# Patient Record
Sex: Male | Born: 1960
Health system: Southern US, Community
[De-identification: ages and names within clinical notes are randomized; demographics above are authoritative.]

## PROBLEM LIST (undated history)

## (undated) DIAGNOSIS — N189 Chronic kidney disease, unspecified: Secondary | ICD-10-CM

## (undated) DIAGNOSIS — E785 Hyperlipidemia, unspecified: Secondary | ICD-10-CM

## (undated) DIAGNOSIS — E119 Type 2 diabetes mellitus without complications: Secondary | ICD-10-CM

## (undated) DIAGNOSIS — I639 Cerebral infarction, unspecified: Secondary | ICD-10-CM

## (undated) DIAGNOSIS — I10 Essential (primary) hypertension: Secondary | ICD-10-CM

## (undated) DIAGNOSIS — I509 Heart failure, unspecified: Secondary | ICD-10-CM

## (undated) DIAGNOSIS — K219 Gastro-esophageal reflux disease without esophagitis: Secondary | ICD-10-CM

## (undated) DIAGNOSIS — F1010 Alcohol abuse, uncomplicated: Secondary | ICD-10-CM

## (undated) HISTORY — DX: Heart failure, unspecified: I50.9

---

## 2004-06-20 ENCOUNTER — Ambulatory Visit: Payer: Self-pay | Admitting: Gastroenterology

## 2004-08-08 ENCOUNTER — Emergency Department: Payer: Self-pay | Admitting: Emergency Medicine

## 2004-08-28 ENCOUNTER — Encounter (INDEPENDENT_AMBULATORY_CARE_PROVIDER_SITE_OTHER): Payer: Self-pay | Admitting: *Deleted

## 2004-08-28 ENCOUNTER — Ambulatory Visit (HOSPITAL_COMMUNITY): Admission: RE | Admit: 2004-08-28 | Discharge: 2004-08-28 | Payer: Self-pay | Admitting: Gastroenterology

## 2005-10-19 ENCOUNTER — Ambulatory Visit: Payer: Self-pay | Admitting: Gastroenterology

## 2007-01-06 ENCOUNTER — Ambulatory Visit: Payer: Self-pay | Admitting: Gastroenterology

## 2007-06-17 ENCOUNTER — Ambulatory Visit: Payer: Self-pay | Admitting: Internal Medicine

## 2007-08-21 ENCOUNTER — Ambulatory Visit: Payer: Self-pay | Admitting: Unknown Physician Specialty

## 2008-12-13 ENCOUNTER — Inpatient Hospital Stay: Payer: Self-pay | Admitting: Internal Medicine

## 2009-04-14 ENCOUNTER — Ambulatory Visit: Payer: Self-pay | Admitting: Gastroenterology

## 2010-05-25 ENCOUNTER — Inpatient Hospital Stay: Payer: Self-pay | Admitting: Internal Medicine

## 2010-08-07 ENCOUNTER — Inpatient Hospital Stay: Payer: Self-pay | Admitting: Vascular Surgery

## 2010-08-25 ENCOUNTER — Other Ambulatory Visit: Payer: Self-pay | Admitting: Internal Medicine

## 2010-08-29 ENCOUNTER — Ambulatory Visit: Payer: Self-pay | Admitting: Vascular Surgery

## 2010-09-19 ENCOUNTER — Encounter: Payer: Self-pay | Admitting: Internal Medicine

## 2010-10-13 ENCOUNTER — Encounter: Payer: Self-pay | Admitting: Internal Medicine

## 2010-11-12 ENCOUNTER — Encounter: Payer: Self-pay | Admitting: Internal Medicine

## 2010-12-13 ENCOUNTER — Encounter: Payer: Self-pay | Admitting: Internal Medicine

## 2011-09-26 ENCOUNTER — Emergency Department: Payer: Self-pay | Admitting: Emergency Medicine

## 2011-09-26 LAB — COMPREHENSIVE METABOLIC PANEL
Albumin: 3.4 g/dL (ref 3.4–5.0)
Alkaline Phosphatase: 47 U/L — ABNORMAL LOW (ref 50–136)
Anion Gap: 4 — ABNORMAL LOW (ref 7–16)
BUN: 36 mg/dL — ABNORMAL HIGH (ref 7–18)
Bilirubin,Total: 0.3 mg/dL (ref 0.2–1.0)
Calcium, Total: 8.5 mg/dL (ref 8.5–10.1)
Chloride: 111 mmol/L — ABNORMAL HIGH (ref 98–107)
Co2: 23 mmol/L (ref 21–32)
Creatinine: 1.44 mg/dL — ABNORMAL HIGH (ref 0.60–1.30)
EGFR (African American): >60
EGFR (Non-African Amer.): 56 — ABNORMAL LOW
Glucose: 67 mg/dL (ref 65–99)
Osmolality: 282 (ref 275–301)
Potassium: 5.3 mmol/L — ABNORMAL HIGH (ref 3.5–5.1)
SGOT(AST): 29 U/L (ref 15–37)
SGPT (ALT): 33 U/L
Sodium: 138 mmol/L (ref 136–145)
Total Protein: 7 g/dL (ref 6.4–8.2)

## 2011-09-26 LAB — CBC WITH DIFFERENTIAL/PLATELET
Basophil #: 0 10*3/uL (ref 0.0–0.1)
Basophil %: 0.6 %
Eosinophil #: 0.3 10*3/uL (ref 0.0–0.7)
Eosinophil %: 4.8 %
HCT: 48.9 % (ref 40.0–52.0)
HGB: 16.4 g/dL (ref 13.0–18.0)
Lymphocyte #: 3.1 10*3/uL (ref 1.0–3.6)
Lymphocyte %: 48.3 %
MCH: 33.8 pg (ref 26.0–34.0)
MCHC: 33.5 g/dL (ref 32.0–36.0)
MCV: 101 fL — ABNORMAL HIGH (ref 80–100)
Monocyte #: 0.6 x10 3/mm (ref 0.2–1.0)
Monocyte %: 9 %
Neutrophil #: 2.4 10*3/uL (ref 1.4–6.5)
Neutrophil %: 37.3 %
Platelet: 273 10*3/uL (ref 150–440)
RBC: 4.86 10*6/uL (ref 4.40–5.90)
RDW: 12.8 % (ref 11.5–14.5)
WBC: 6.4 10*3/uL (ref 3.8–10.6)

## 2011-09-26 LAB — TROPONIN I: Troponin-I: <0.02 ng/mL

## 2011-09-26 LAB — TSH: Thyroid Stimulating Horm: 0.942 u[IU]/mL

## 2011-11-05 ENCOUNTER — Ambulatory Visit: Payer: Self-pay | Admitting: Internal Medicine

## 2014-01-17 ENCOUNTER — Inpatient Hospital Stay: Payer: Self-pay | Admitting: Internal Medicine

## 2014-01-17 LAB — CBC
HCT: 24.8 % — ABNORMAL LOW (ref 40.0–52.0)
HGB: 8.2 g/dL — ABNORMAL LOW (ref 13.0–18.0)
MCH: 34.2 pg — ABNORMAL HIGH (ref 26.0–34.0)
MCHC: 33.3 g/dL (ref 32.0–36.0)
MCV: 103 fL — ABNORMAL HIGH (ref 80–100)
Platelet: 204 10*3/uL (ref 150–440)
RBC: 2.41 10*6/uL — ABNORMAL LOW (ref 4.40–5.90)
RDW: 12.3 % (ref 11.5–14.5)
WBC: 5.6 10*3/uL (ref 3.8–10.6)

## 2014-01-17 LAB — URINALYSIS, COMPLETE
Bilirubin,UR: NEGATIVE
Blood: NEGATIVE
Glucose,UR: NEGATIVE mg/dL (ref 0–75)
Hyaline Cast: 9
Ketone: NEGATIVE
Nitrite: NEGATIVE
Ph: 5 (ref 4.5–8.0)
Protein: NEGATIVE
RBC,UR: 1 /HPF (ref 0–5)
Specific Gravity: 1.011 (ref 1.003–1.030)
Squamous Epithelial: <1
WBC UR: 6 /HPF (ref 0–5)

## 2014-01-17 LAB — COMPREHENSIVE METABOLIC PANEL
Albumin: 3 g/dL — ABNORMAL LOW (ref 3.4–5.0)
Alkaline Phosphatase: 51 U/L
Anion Gap: 8 (ref 7–16)
BUN: 79 mg/dL — ABNORMAL HIGH (ref 7–18)
Bilirubin,Total: 0.2 mg/dL (ref 0.2–1.0)
Calcium, Total: 7.9 mg/dL — ABNORMAL LOW (ref 8.5–10.1)
Chloride: 104 mmol/L (ref 98–107)
Co2: 17 mmol/L — ABNORMAL LOW (ref 21–32)
Creatinine: 2.59 mg/dL — ABNORMAL HIGH (ref 0.60–1.30)
EGFR (African American): 31 — ABNORMAL LOW
EGFR (Non-African Amer.): 27 — ABNORMAL LOW
Glucose: 122 mg/dL — ABNORMAL HIGH (ref 65–99)
Osmolality: 284 (ref 275–301)
Potassium: 5.6 mmol/L — ABNORMAL HIGH (ref 3.5–5.1)
SGOT(AST): 68 U/L — ABNORMAL HIGH (ref 15–37)
SGPT (ALT): 60 U/L
Sodium: 129 mmol/L — ABNORMAL LOW (ref 136–145)
Total Protein: 6.4 g/dL (ref 6.4–8.2)

## 2014-01-17 LAB — IRON AND TIBC
Iron Bind.Cap.(Total): 320 ug/dL (ref 250–450)
Iron Saturation: 29 %
Iron: 94 ug/dL (ref 65–175)
Unbound Iron-Bind.Cap.: 226 ug/dL

## 2014-01-17 LAB — APTT: Activated PTT: 42.9 secs — ABNORMAL HIGH (ref 23.6–35.9)

## 2014-01-17 LAB — LIPASE, BLOOD: Lipase: 217 U/L (ref 73–393)

## 2014-01-17 LAB — LIPID PANEL
Cholesterol: 184 mg/dL (ref 0–200)
HDL Cholesterol: 59 mg/dL (ref 40–60)
Ldl Cholesterol, Calc: 104 mg/dL — ABNORMAL HIGH (ref 0–100)
Triglycerides: 105 mg/dL (ref 0–200)
VLDL Cholesterol, Calc: 21 mg/dL (ref 5–40)

## 2014-01-17 LAB — PROTIME-INR
INR: 3.1
Prothrombin Time: 31.4 secs — ABNORMAL HIGH (ref 11.5–14.7)

## 2014-01-17 LAB — TROPONIN I: Troponin-I: <0.02 ng/mL

## 2014-01-17 LAB — MAGNESIUM: Magnesium: 2.5 mg/dL — ABNORMAL HIGH

## 2014-01-17 LAB — RETICULOCYTES
Absolute Retic Count: 0.1291 10*6/uL (ref 0.019–0.186)
Reticulocyte: 5.39 % — ABNORMAL HIGH (ref 0.4–3.1)

## 2014-01-17 LAB — FERRITIN: Ferritin (ARMC): 80 ng/mL (ref 8–388)

## 2014-01-17 LAB — HEMOGLOBIN A1C: Hemoglobin A1C: <3.5 % — ABNORMAL LOW (ref 4.2–6.3)

## 2014-01-18 LAB — CBC WITH DIFFERENTIAL/PLATELET
Basophil #: 0 10*3/uL (ref 0.0–0.1)
Basophil %: 0.5 %
Eosinophil #: 0.1 10*3/uL (ref 0.0–0.7)
Eosinophil %: 1.8 %
HCT: 24.8 % — ABNORMAL LOW (ref 40.0–52.0)
HGB: 8.3 g/dL — ABNORMAL LOW (ref 13.0–18.0)
Lymphocyte #: 1.4 10*3/uL (ref 1.0–3.6)
Lymphocyte %: 32.2 %
MCH: 33.1 pg (ref 26.0–34.0)
MCHC: 33.5 g/dL (ref 32.0–36.0)
MCV: 99 fL (ref 80–100)
Monocyte #: 0.5 x10 3/mm (ref 0.2–1.0)
Monocyte %: 11.7 %
Neutrophil #: 2.4 10*3/uL (ref 1.4–6.5)
Neutrophil %: 53.8 %
Platelet: 176 10*3/uL (ref 150–440)
RBC: 2.51 10*6/uL — ABNORMAL LOW (ref 4.40–5.90)
RDW: 14.3 % (ref 11.5–14.5)
WBC: 4.5 10*3/uL (ref 3.8–10.6)

## 2014-01-18 LAB — BASIC METABOLIC PANEL
Anion Gap: 3 — ABNORMAL LOW (ref 7–16)
BUN: 68 mg/dL — ABNORMAL HIGH (ref 7–18)
Calcium, Total: 8.4 mg/dL — ABNORMAL LOW (ref 8.5–10.1)
Chloride: 111 mmol/L — ABNORMAL HIGH (ref 98–107)
Co2: 20 mmol/L — ABNORMAL LOW (ref 21–32)
Creatinine: 1.94 mg/dL — ABNORMAL HIGH (ref 0.60–1.30)
EGFR (African American): 44 — ABNORMAL LOW
EGFR (Non-African Amer.): 38 — ABNORMAL LOW
Glucose: 114 mg/dL — ABNORMAL HIGH (ref 65–99)
Osmolality: 289 (ref 275–301)
Potassium: 5.3 mmol/L — ABNORMAL HIGH (ref 3.5–5.1)
Sodium: 134 mmol/L — ABNORMAL LOW (ref 136–145)

## 2014-01-18 LAB — HEMOGLOBIN
HGB: 8 g/dL — ABNORMAL LOW (ref 13.0–18.0)
HGB: 8.2 g/dL — ABNORMAL LOW (ref 13.0–18.0)

## 2014-01-18 LAB — PROTIME-INR
INR: 3.3
Prothrombin Time: 32.9 secs — ABNORMAL HIGH (ref 11.5–14.7)

## 2014-01-19 LAB — COMPREHENSIVE METABOLIC PANEL
Albumin: 2.4 g/dL — ABNORMAL LOW (ref 3.4–5.0)
Alkaline Phosphatase: 39 U/L — ABNORMAL LOW
Anion Gap: 3 — ABNORMAL LOW (ref 7–16)
BUN: 43 mg/dL — ABNORMAL HIGH (ref 7–18)
Bilirubin,Total: 0.2 mg/dL (ref 0.2–1.0)
Calcium, Total: 7.9 mg/dL — ABNORMAL LOW (ref 8.5–10.1)
Chloride: 116 mmol/L — ABNORMAL HIGH (ref 98–107)
Co2: 21 mmol/L (ref 21–32)
Creatinine: 1.57 mg/dL — ABNORMAL HIGH (ref 0.60–1.30)
EGFR (African American): 57 — ABNORMAL LOW
EGFR (Non-African Amer.): 49 — ABNORMAL LOW
Glucose: 113 mg/dL — ABNORMAL HIGH (ref 65–99)
Osmolality: 291 (ref 275–301)
Potassium: 5.2 mmol/L — ABNORMAL HIGH (ref 3.5–5.1)
SGOT(AST): 43 U/L — ABNORMAL HIGH (ref 15–37)
SGPT (ALT): 43 U/L
Sodium: 140 mmol/L (ref 136–145)
Total Protein: 5.5 g/dL — ABNORMAL LOW (ref 6.4–8.2)

## 2014-01-19 LAB — PROTIME-INR
INR: 1.9
Prothrombin Time: 21.6 secs — ABNORMAL HIGH (ref 11.5–14.7)

## 2014-01-19 LAB — OCCULT BLOOD X 1 CARD TO LAB, STOOL: Occult Blood, Feces: POSITIVE

## 2014-01-20 LAB — CBC WITH DIFFERENTIAL/PLATELET
Basophil #: 0 10*3/uL (ref 0.0–0.1)
Basophil %: 0.3 %
Eosinophil #: 0.2 10*3/uL (ref 0.0–0.7)
Eosinophil %: 3 %
HCT: 19.7 % — ABNORMAL LOW (ref 40.0–52.0)
HGB: 6.4 g/dL — ABNORMAL LOW (ref 13.0–18.0)
Lymphocyte #: 1.7 10*3/uL (ref 1.0–3.6)
Lymphocyte %: 29.6 %
MCH: 33.4 pg (ref 26.0–34.0)
MCHC: 32.7 g/dL (ref 32.0–36.0)
MCV: 102 fL — ABNORMAL HIGH (ref 80–100)
Monocyte #: 0.5 x10 3/mm (ref 0.2–1.0)
Monocyte %: 8.7 %
Neutrophil #: 3.3 10*3/uL (ref 1.4–6.5)
Neutrophil %: 58.4 %
Platelet: 222 10*3/uL (ref 150–440)
RBC: 1.93 10*6/uL — ABNORMAL LOW (ref 4.40–5.90)
RDW: 14.4 % (ref 11.5–14.5)
WBC: 5.7 10*3/uL (ref 3.8–10.6)

## 2014-01-20 LAB — BASIC METABOLIC PANEL
Anion Gap: 5 — ABNORMAL LOW (ref 7–16)
BUN: 18 mg/dL (ref 7–18)
Calcium, Total: 7.3 mg/dL — ABNORMAL LOW (ref 8.5–10.1)
Chloride: 116 mmol/L — ABNORMAL HIGH (ref 98–107)
Co2: 22 mmol/L (ref 21–32)
Creatinine: 1.32 mg/dL — ABNORMAL HIGH (ref 0.60–1.30)
EGFR (African American): >60
EGFR (Non-African Amer.): >60
Glucose: 94 mg/dL (ref 65–99)
Osmolality: 287 (ref 275–301)
Potassium: 4.6 mmol/L (ref 3.5–5.1)
Sodium: 143 mmol/L (ref 136–145)

## 2014-01-20 LAB — PROTIME-INR
INR: 1.5
Prothrombin Time: 17.7 secs — ABNORMAL HIGH (ref 11.5–14.7)

## 2014-01-25 LAB — PATHOLOGY REPORT

## 2014-01-28 ENCOUNTER — Ambulatory Visit: Payer: Self-pay | Admitting: Internal Medicine

## 2014-01-29 LAB — IRON AND TIBC
Iron Bind.Cap.(Total): 350 ug/dL (ref 250–450)
Iron Saturation: 9 %
Iron: 33 ug/dL — ABNORMAL LOW (ref 65–175)
Unbound Iron-Bind.Cap.: 317 ug/dL

## 2014-01-29 LAB — CBC CANCER CENTER
Basophil #: 0 x10 3/mm (ref 0.0–0.1)
Basophil %: 0.7 %
Eosinophil #: 0.1 x10 3/mm (ref 0.0–0.7)
Eosinophil %: 1.9 %
HCT: 33.4 % — ABNORMAL LOW (ref 40.0–52.0)
HGB: 10.9 g/dL — ABNORMAL LOW (ref 13.0–18.0)
Lymphocyte #: 1.4 x10 3/mm (ref 1.0–3.6)
Lymphocyte %: 25.8 %
MCH: 31.3 pg (ref 26.0–34.0)
MCHC: 32.7 g/dL (ref 32.0–36.0)
MCV: 96 fL (ref 80–100)
Monocyte #: 0.5 x10 3/mm (ref 0.2–1.0)
Monocyte %: 10.3 %
Neutrophil #: 3.2 x10 3/mm (ref 1.4–6.5)
Neutrophil %: 61.3 %
Platelet: 375 x10 3/mm (ref 150–440)
RBC: 3.49 10*6/uL — ABNORMAL LOW (ref 4.40–5.90)
RDW: 15.1 % — ABNORMAL HIGH (ref 11.5–14.5)
WBC: 5.2 x10 3/mm (ref 3.8–10.6)

## 2014-01-29 LAB — FOLATE: Folic Acid: 10.4 ng/mL (ref 3.1–100.0)

## 2014-01-29 LAB — FERRITIN: Ferritin (ARMC): 54 ng/mL (ref 8–388)

## 2014-02-03 LAB — PROT IMMUNOELECTROPHORES(ARMC)

## 2014-02-03 LAB — KAPPA/LAMBDA FREE LIGHT CHAINS (ARMC)

## 2014-02-11 ENCOUNTER — Ambulatory Visit: Payer: Self-pay | Admitting: Internal Medicine

## 2014-02-12 LAB — CBC CANCER CENTER
Basophil #: 0.1 x10 3/mm (ref 0.0–0.1)
Basophil %: 0.9 %
Eosinophil #: 0.1 x10 3/mm (ref 0.0–0.7)
Eosinophil %: 1.8 %
HCT: 41.7 % (ref 40.0–52.0)
HGB: 13.1 g/dL (ref 13.0–18.0)
Lymphocyte #: 1.6 x10 3/mm (ref 1.0–3.6)
Lymphocyte %: 19.8 %
MCH: 29.9 pg (ref 26.0–34.0)
MCHC: 31.4 g/dL — ABNORMAL LOW (ref 32.0–36.0)
MCV: 95 fL (ref 80–100)
Monocyte #: 0.7 x10 3/mm (ref 0.2–1.0)
Monocyte %: 8.7 %
Neutrophil #: 5.4 x10 3/mm (ref 1.4–6.5)
Neutrophil %: 68.8 %
Platelet: 322 x10 3/mm (ref 150–440)
RBC: 4.38 10*6/uL — ABNORMAL LOW (ref 4.40–5.90)
RDW: 16.8 % — ABNORMAL HIGH (ref 11.5–14.5)
WBC: 7.9 x10 3/mm (ref 3.8–10.6)

## 2014-03-14 ENCOUNTER — Ambulatory Visit: Payer: Self-pay | Admitting: Internal Medicine

## 2014-09-04 NOTE — Consult Note (Signed)
PATIENT NAME:  [name], [name] MR#:  [id number] DATE OF BIRTH:  [birth year]  DATE OF CONSULTATION:  01/18/2014  REFERRING PHYSICIAN:  Jenny Reichmann B. Sarina Ser, MD CONSULTING PHYSICIAN:  Arther Dames, MD  REASON FOR CONSULTATION: Severe anemia.   HISTORY OF PRESENT ILLNESS: [name] is a 54 year old male with a past medical history notable for CVA, carotid artery stenosis, hypertension, DM 2 who presented to the hospital with severe weakness. He reports that he could barely stand up the day prior to presentation  and was having jerky-like motions and therefore presented to the Emergency Room. In the Emergency Room, it was noted that he had anemia of 8 compared to a prior of 14 back in December 2014. He was also somewhat hypotensive and had worsening kidney injury in the Emergency Room.   In terms of his stools, he is not really sure what color his bowel movements have been. He does report that he normally moves his bowels about 3 times a day and that trend has continued up until this admission. He thinks that when he wiped here in the hospital he may have seen some dark material, but he is really not at all sure of any of the color of his stools or any of the color on the toilet paper.   He denies any prior GI bleed. He denies taking any ibuprofen, Aleve, naproxen. He does report that he thinks he had a colonoscopy about 3 years ago that was unremarkable.   PAST MEDICAL HISTORY:  1.  Hypertension.  2.  DM 2.  3.  CVA.  4.  Carotid artery stenosis.   PAST SURGICAL HISTORY: Amputation of the left first and second toes.   ALLERGIES: NKDA.  HOME MEDICATIONS: Aspirin 325 daily, lisinopril 20 mg b.i.d., Coumadin 6 mg daily Monday through Friday and 8 mg on Saturday and Sunday.   SOCIAL HISTORY: He denies any tobacco or alcohol to me.   FAMILY HISTORY: No family history of GI malignancy that he is aware of.  REVIEW OF SYSTEMS: A 10 system review was conducted. It is negative except as stated in the  HPI.    VITAL SIGNS: Temperature is 98.3, pulse is 80, respirations are 18, blood pressure is 103/68, orthostatics are positive. Pulse oximetry is 99% on room air.   LABORATORY DATA: Currently his sodium 134, potassium 5.3, creatinine 1.94, BUN is 68. His TIBC 320, iron sat is 29%. His ferritin is 80. Liver enzymes: AST is elevated at 68, ALT is 60, otherwise they are normal. Albumin is slightly low at 3. Hemoglobin currently stable at 8.2. White count is 4.5, platelets are 176,000. His INR currently is 3.3. His reticulocyte counts are increased.   ASSESSMENT AND PLAN: Severe anemia: He does seem to have a significant drop from his baseline of 14 about 8 months ago to 8 currently. Unfortunately, he is unsure of the color of his stool. It is also not clear the color of his bowel movements here in the hospital. He is currently declining a rectal examination at this time. Although his iron studies are not consistent with an iron-deficiency anemia, it is possible that he has been having some overt bleeding and just has not noticed it in his stool.   Given the need to go back on anticoagulation, I think it would be reasonable to perform an upper endoscopy and a colonoscopy to look for a potential source of bleeding. In the meantime, I would like to see the color of his next  bowel movement and to perform a Hemoccults on his stool. We will need to monitor his hemoglobin and keep his hemoglobin greater than 8 as it currently is. Colonoscopy and upper endoscopy will likely be on Wednesday, given he is eating solid foods today.   Thank you for this consult    ____________________________ Arther Dames, MD mr:TT D: 01/18/2014 19:52:37 ET T: 01/18/2014 21:28:02 ET JOB#: [id number]  cc: Arther Dames, MD, <Dictator> Mellody Life MD ELECTRONICALLY SIGNED 01/28/2014 20:44

## 2014-09-04 NOTE — Consult Note (Signed)
Brief Consult Note: Diagnosis: anemia, ? melena.   Patient was seen by consultant.   Consult note dictated.   Recommend to proceed with surgery or procedure.   Comments: Anemia, unclear if any overt bleeding or not, now c/w IDA but if is more acute, would not have time to reflect in iron studies.   Recs: - FOBT - likely EGD + colon once INR has trended down.  Electronic Signatures: Arther Dames (MD)  (Signed 07-Sep-15 20:08)  Authored: Brief Consult Note   Last Updated: 07-Sep-15 20:08 by Arther Dames (MD)

## 2014-09-04 NOTE — Consult Note (Signed)
Details:   - GI Note:  Plan for EGD + Colon tomorrow.  Please give half of the go-lyte tonight and half from 7 am to 9 am.   Electronic Signatures: Arther Dames (MD)  (Signed 08-Sep-15 17:32)  Authored: Details   Last Updated: 08-Sep-15 17:32 by Arther Dames (MD)

## 2014-09-04 NOTE — Discharge Summary (Signed)
PATIENT NAME:  [name], [name] MR#:  [id number] DATE OF BIRTH:  1961-02-17  DATE OF ADMISSION:  01/17/2014 DATE OF DISCHARGE:  01/20/2014  DIAGNOSES AT TIME OF DISCHARGE:  1. Generalized weakness with anemia, most likely secondary to gastrointestinal blood loss.  2. History of previous cerebrovascular accident on chronic warfarin therapy.  3. Acute renal failure with hyperkalemia, improved with IV hydration.  4. Diet-controlled diabetes.  5. Hypertension.   CHIEF COMPLAINT: Generalized weakness.   HISTORY OF PRESENT ILLNESS: Alijha Rummel is a 54 year old African-American male with history of hypertension, diet-controlled diabetes, previous strokes with critical bilateral carotid stenosis on Coumadin and aspirin, who presented to the hospital complaining of generalized weakness associated with tremulousness of both his hands. The patient was also noted to be hypotensive with systolic blood pressure in the 80s and initial hemoglobin on admission was 8.8 with hematocrit of 24.8.  The patient's normal hemoglobin about two years back was 16. The patient denies any passage of blood in the stool, but however his stool guaiac was positive.   PAST MEDICAL HISTORY: Significant for hypertension, diet-controlled diabetes, prior strokes, critical bilateral carotid stenosis.   PAST SURGICAL HISTORY: Significant for left first and second toe amputation for infection. Please see H and P for other details.   HOSPITAL COURSE: The patient was admitted to [facility] and received intravenous fluids. He was also seen in consultation by gastroenterologist Dr. Rayann Heman and underwent both colonoscopy as well as EGD.  His Coumadin was held for a couple of days prior to procedure. EGD showed normal esophagus, normal stomach and erythematous duodenopathy. Colonoscopy was also done at the same time or immediately after and showed non-thrombosed external hemorrhoids. There was one 4 mm polyp in the sigmoid colon that was resected and one 2  mm polyp in the transverse colon, was also resected, possible polyp was also noted in the sigmoid colon of 3 mm but Dr. Rayann Heman was not able to localize it again because of severe spasm. The patient was advised to have a video capsule, but again this was not done because of equipment issues. The patient was reluctant to stay in the hospital and made several requests to go home. Home health PT and nursing were arranged for him and he was advised to restart his warfarin. His renal function did improve with creatinine improving from 2.59 to 1.32 on day of admission. He  received a total of 3 units of blood transfusion. Ultrasound of his kidneys was also done that was essentially normal. The patient was stable at the time of discharge and was discharged home on the following medications.    DISCHARGE MEDICATIONS: Warfarin 6 mg Monday through Friday and 8 mg on Saturday and Sunday, pravastatin 20 mg once a day, lisinopril 10 mg once a day, and omeprazole 20 mg p.o. b.i.d.   FOLLOWUP:  The patient was advised to follow up with me, Dr. Ginette Pitman, and also follow up with Dr. Rayann Heman in 1 to 2 weeks. Advised that he would need a capsule endoscopy as an outpatient. He has been advised to have a CBC and PT/INR also checked in 1 week. He has been advised to report to the ER if he has any persistent weakness or if he notices any blood in the stool or if he feels unwell in any way. He was stable at the time of discharge.   TOTAL TIME SPENT IN DISCHARGING THE PATIENT: 35 minutes.     ____________________________ [name], MD vh:bu D: 01/21/2014 17:23:59 ET  T: 01/21/2014 17:47:40 ET JOB#: [id number]  cc: [name], MD, <Dictator> [name] MD ELECTRONICALLY SIGNED 02/02/2014 17:55

## 2014-09-04 NOTE — Consult Note (Signed)
Details:   - GI Note:  EGD and colon done today with no source of bleeding seen.  Recs: - capsule endoscopy today - hematology consult - ok to start anti-coag if capsule ok.   Electronic Signatures: Arther Dames (MD)  (Signed 09-Sep-15 13:27)  Authored: Details   Last Updated: 09-Sep-15 13:27 by Arther Dames (MD)

## 2014-09-04 NOTE — H&P (Signed)
PATIENT NAME:  [name], [name] MR#:  [id number] DATE OF BIRTH:  Sep 17, 1960  DATE OF ADMISSION:  01/17/2014  ADMITTING PHYSICIAN: Gladstone Lighter, MD   PRIMARY CARE PHYSICIAN: Dr. Ginette Pitman   CHIEF COMPLAINT: Generalized weakness.   HISTORY OF PRESENT ILLNESS: [name] is a 54 year old African American male with past medical history significant for hypertension, diet-controlled diabetes mellitus, 3 strokes in the past with critical bilateral carotid artery stenosis, on Coumadin and aspirin, comes to the hospital secondary to generalized weakness and shakes of both his hands that started yesterday. The patient is very unhappy to stay in the hospital. He has been having weakness since yesterday and has shakes of his right arm and right leg, which he usually has jerks since the stroke, but have worsened in frequency since yesterday. His wife was concerned that he was having another stroke so called EMS and he was brought over here. The patient is noted to be hypotensive, systolic blood pressure in the 80s, with orthostatic increase in his heart rate whenever he tries to stand up. His hemoglobin is noted to be at 8 today. His hemoglobin back a couple of years ago was around 16. He denies any active bleeding or melanotic stools, still his guaiac here is positive. So he is being admitted for anemia and also noted to have hyperkalemia and acute renal failure on his blood work.   PAST MEDICAL HISTORY:  1.  Hypertension.  2.  Diet-controlled diabetes mellitus.  3.  History of prior strokes with residual right-sided weakness. 4.  Critical bilateral carotid artery stenosis.  PAST SURGICAL HISTORY: Left first and second toes amputation for infection.   ALLERGIES: No known drug allergies.   CURRENT HOME MEDICATIONS:  1.  Aspirin 325 mg p.o. daily. 2.  Lisinopril 20 mg p.o. b.i.d.  3.  Coumadin 6 mg p.o. daily for Monday through Friday and 8 mg on Saturday and Sunday.   SOCIAL HISTORY: Lives at home with his  wife. He ambulates independently but sometimes has to use support. He has a cane, which he does not use. Smokes about 3 cigars every day and occasional alcohol use.   FAMILY HISTORY: Mom with dementia; dad with diabetes and end-stage renal disease on dialysis.   REVIEW OF SYSTEMS: CONSTITUTIONAL: No fever. Positive for fatigue and weakness.  EYES: No blurred vision, double vision, inflammation or glaucoma.  EARS, NOSE AND THROAT: No tinnitus, ear pain, hearing loss, epistaxis or discharge.  RESPIRATORY: No cough, wheeze, hemoptysis or COPD.  CARDIOVASCULAR: No chest pain, orthopnea, edema, arrhythmia, palpitations or syncope.  GASTROINTESTINAL: Positive for nausea. No vomiting, diarrhea, abdominal pain, hematemesis or melena.  GENITOURINARY: No dysuria, hematuria or renal calculus, frequency or incontinence.  ENDOCRINE: No polyuria, nocturia, thyroid problems, heat or cold intolerance.  HEMATOLOGY: No anemia, easy bruising or bleeding.  SKIN: No acne, rash or lesions.  MUSCULOSKELETAL: No neck fracture, pain, arthritis or gout.  NEUROLOGIC: Positive for history of CVA. No numbness, weakness, TIA or seizures.  PSYCHOLOGIC: No anxiety, insomnia, depression.   PHYSICAL EXAMINATION:  VITAL SIGNS: Temperature 98.1 degrees Fahrenheit, pulse 97, respirations 19, blood pressure 85/53, pulse oximetry 100% on room air.  GENERAL: Well-built, well-nourished male lying in bed, not in any acute distress.  HEENT: Normocephalic, atraumatic.  Pupils equal, round, reacting to light. Muddy conjunctivae. Anicteric sclerae. Extraocular movements intact. Oropharynx clear without erythema, mass or exudates.  NECK: Supple, nontender. No thyromegaly, JVD or carotid bruit. No lymphadenopathy.  LUNGS: Moving air bilaterally. No wheeze or crackles.  No use of accessory muscles for breathing.  CARDIOVASCULAR: S1, S2, regular rate and rhythm. No murmurs, rubs or gallops.  ABDOMEN: Soft, nontender, nondistended. No  hepatosplenomegaly. Normal bowel sounds.  EXTREMITIES: No pedal edema. No clubbing or cyanosis. Dorsalis pedis pulses 2+  palpable bilaterally.  SKIN: No acne, rash or lesions. LYMPHATICS: No cervical or inguinal lymphadenopathy.  NEUROLOGIC: Cranial nerves intact. Strength 4/5 on right upper and right lower extremities. No new motor or sensory deficits.  PSYCHOLOGICAL: The patient is awake, alert, oriented x 3.   LABORATORY DATA: WBC is 5.6, hemoglobin 8.8, hematocrit 24.8, platelet count 204,000.   Sodium 129, potassium 5.6, chloride 104, bicarbonate 17, BUN 79, creatinine 2.59, glucose of 122, calcium of 7.9.   ALT 60, AST 68, alkaline phosphatase 51, total bilirubin 0.2, albumin of 3.0. Magnesium 2.5. Lipase 217. INR 3.1. Troponin less than 0.02.   CT of the head without contrast showing no acute intracranial hemorrhage or acute cortical infarct, old left cerebral hemispheric infarction, old right caudate nucleus infarcts noted. EKG showing normal sinus rhythm, heart rate of 97. No acute ST-T wave abnormalities.   ASSESSMENT AND PLAN: This is a 54 year old male with history of hypertension, diabetes diet-controlled and prior history of stroke with minimal residual right-sided weakness and bilateral carotid artery stenosis comes with weakness. Noted to have anemia and acute renal failure and hyperkalemia.  1.  Anemia, likely acute on chronic anemia. Last hemoglobin 2 years ago was 16, now he is at 8.  He denies any active bleeding but he is on Coumadin and warfarin and his stool is positive for guaiac. So hold both medications for now. He has hypotension, tachycardic and symptomatic so he is going to get 1 unit of packed red blood cells. Recheck hemoglobin q. 8 hours. Check orthostatic vital signs and get a gastrointestinal consult for the same. We will place him on Protonix drip empirically at this time.   2.  Acute renal failure and hyperkalemia. Likely dehydration and on lisinopril. Hold  lisinopril. Intravenous fluids. Renal ultrasound and monitor.  3.  History of cerebrovascular accident. Both aspirin and warfarin are held tonight. Recheck hemoglobin. The patient will need to be on the anticoagulation at discharge as significant carotid stenosis with prior multiple cerebrovascular accidents. Nonoperable stenosis. Physical therapy in the a.m. Also added statin. Check lipid profile.  4.  Diabetes mellitus. The patient says he was on metformin, that was taken off by his endocrinologist.  5.  Deep veinous thrombosis prophylaxis with thromboembolic deterrent stockings and sequential compression devices.  CODE STATUS: Full code.  TIME SPENT ON ADMISSION: Fifty minutes.    ____________________________ Gladstone Lighter, MD rk:TT D: 01/17/2014 18:24:15 ET T: 01/17/2014 18:58:58 ET JOB#: [id number]  cc: Gladstone Lighter, MD, <Dictator> unknown cc Gladstone Lighter MD ELECTRONICALLY SIGNED 01/18/2014 17:37

## 2015-12-29 ENCOUNTER — Ambulatory Visit: Payer: Self-pay

## 2015-12-29 ENCOUNTER — Other Ambulatory Visit: Payer: Self-pay | Admitting: Internal Medicine

## 2015-12-29 DIAGNOSIS — I6320 Cerebral infarction due to unspecified occlusion or stenosis of unspecified precerebral arteries: Secondary | ICD-10-CM

## 2015-12-29 DIAGNOSIS — G458 Other transient cerebral ischemic attacks and related syndromes: Secondary | ICD-10-CM

## 2015-12-31 ENCOUNTER — Ambulatory Visit
Admission: RE | Admit: 2015-12-31 | Discharge: 2015-12-31 | Disposition: A | Discharge status: Home / Self Care | Payer: Medicare Other | Source: Ambulatory Visit | Attending: Internal Medicine | Admitting: Internal Medicine

## 2015-12-31 DIAGNOSIS — G458 Other transient cerebral ischemic attacks and related syndromes: Secondary | ICD-10-CM

## 2015-12-31 DIAGNOSIS — I6320 Cerebral infarction due to unspecified occlusion or stenosis of unspecified precerebral arteries: Secondary | ICD-10-CM

## 2015-12-31 DIAGNOSIS — I739 Peripheral vascular disease, unspecified: Secondary | ICD-10-CM | POA: Insufficient documentation

## 2015-12-31 DIAGNOSIS — I639 Cerebral infarction, unspecified: Secondary | ICD-10-CM | POA: Insufficient documentation

## 2016-06-14 ENCOUNTER — Other Ambulatory Visit (HOSPITAL_COMMUNITY): Payer: Self-pay | Admitting: Neurological Surgery

## 2016-06-14 DIAGNOSIS — M4120 Other idiopathic scoliosis, site unspecified: Secondary | ICD-10-CM

## 2016-06-29 ENCOUNTER — Encounter: Payer: Self-pay | Admitting: Emergency Medicine

## 2016-06-29 ENCOUNTER — Emergency Department: Payer: Medicare Other

## 2016-06-29 ENCOUNTER — Inpatient Hospital Stay
Admission: EM | Admit: 2016-06-29 | Discharge: 2016-07-01 | DRG: 812 | Disposition: A | Discharge status: Home / Self Care | Payer: Medicare Other | Attending: Internal Medicine | Admitting: Internal Medicine

## 2016-06-29 DIAGNOSIS — D509 Iron deficiency anemia, unspecified: Secondary | ICD-10-CM | POA: Insufficient documentation

## 2016-06-29 DIAGNOSIS — D6859 Other primary thrombophilia: Secondary | ICD-10-CM | POA: Diagnosis present

## 2016-06-29 DIAGNOSIS — R4182 Altered mental status, unspecified: Secondary | ICD-10-CM | POA: Diagnosis present

## 2016-06-29 DIAGNOSIS — Z9889 Other specified postprocedural states: Secondary | ICD-10-CM

## 2016-06-29 DIAGNOSIS — Z720 Tobacco use: Secondary | ICD-10-CM

## 2016-06-29 DIAGNOSIS — E1165 Type 2 diabetes mellitus with hyperglycemia: Secondary | ICD-10-CM

## 2016-06-29 DIAGNOSIS — B3781 Candidal esophagitis: Secondary | ICD-10-CM | POA: Diagnosis present

## 2016-06-29 DIAGNOSIS — I6523 Occlusion and stenosis of bilateral carotid arteries: Secondary | ICD-10-CM | POA: Diagnosis present

## 2016-06-29 DIAGNOSIS — K922 Gastrointestinal hemorrhage, unspecified: Secondary | ICD-10-CM | POA: Diagnosis present

## 2016-06-29 DIAGNOSIS — I129 Hypertensive chronic kidney disease with stage 1 through stage 4 chronic kidney disease, or unspecified chronic kidney disease: Secondary | ICD-10-CM | POA: Diagnosis present

## 2016-06-29 DIAGNOSIS — E785 Hyperlipidemia, unspecified: Secondary | ICD-10-CM | POA: Diagnosis present

## 2016-06-29 DIAGNOSIS — Z9289 Personal history of other medical treatment: Secondary | ICD-10-CM

## 2016-06-29 DIAGNOSIS — Z7901 Long term (current) use of anticoagulants: Secondary | ICD-10-CM

## 2016-06-29 DIAGNOSIS — F1010 Alcohol abuse, uncomplicated: Secondary | ICD-10-CM

## 2016-06-29 DIAGNOSIS — D689 Coagulation defect, unspecified: Secondary | ICD-10-CM

## 2016-06-29 DIAGNOSIS — N179 Acute kidney failure, unspecified: Secondary | ICD-10-CM

## 2016-06-29 DIAGNOSIS — E1122 Type 2 diabetes mellitus with diabetic chronic kidney disease: Secondary | ICD-10-CM | POA: Diagnosis present

## 2016-06-29 DIAGNOSIS — I69351 Hemiplegia and hemiparesis following cerebral infarction affecting right dominant side: Secondary | ICD-10-CM

## 2016-06-29 DIAGNOSIS — N183 Chronic kidney disease, stage 3 (moderate): Secondary | ICD-10-CM | POA: Diagnosis present

## 2016-06-29 DIAGNOSIS — Z8249 Family history of ischemic heart disease and other diseases of the circulatory system: Secondary | ICD-10-CM | POA: Diagnosis not present

## 2016-06-29 DIAGNOSIS — IMO0002 Reserved for concepts with insufficient information to code with codable children: Secondary | ICD-10-CM

## 2016-06-29 DIAGNOSIS — E871 Hypo-osmolality and hyponatremia: Secondary | ICD-10-CM | POA: Diagnosis present

## 2016-06-29 DIAGNOSIS — Z23 Encounter for immunization: Secondary | ICD-10-CM

## 2016-06-29 DIAGNOSIS — N184 Chronic kidney disease, stage 4 (severe): Secondary | ICD-10-CM

## 2016-06-29 DIAGNOSIS — F17290 Nicotine dependence, other tobacco product, uncomplicated: Secondary | ICD-10-CM | POA: Diagnosis present

## 2016-06-29 DIAGNOSIS — I639 Cerebral infarction, unspecified: Secondary | ICD-10-CM

## 2016-06-29 DIAGNOSIS — R410 Disorientation, unspecified: Secondary | ICD-10-CM | POA: Insufficient documentation

## 2016-06-29 HISTORY — DX: Essential (primary) hypertension: I10

## 2016-06-29 HISTORY — DX: Type 2 diabetes mellitus without complications: E11.9

## 2016-06-29 HISTORY — DX: Chronic kidney disease, unspecified: N18.9

## 2016-06-29 HISTORY — DX: Cerebral infarction, unspecified: I63.9

## 2016-06-29 HISTORY — DX: Hyperlipidemia, unspecified: E78.5

## 2016-06-29 LAB — PROTIME-INR
INR: 11.02
Prothrombin Time: 90 seconds — ABNORMAL HIGH (ref 11.4–15.2)

## 2016-06-29 LAB — COMPREHENSIVE METABOLIC PANEL
ALT: 34 U/L (ref 17–63)
AST: 49 U/L — ABNORMAL HIGH (ref 15–41)
Albumin: 2.6 g/dL — ABNORMAL LOW (ref 3.5–5.0)
Alkaline Phosphatase: 52 U/L (ref 38–126)
Anion gap: 11 (ref 5–15)
BUN: 19 mg/dL (ref 6–20)
CO2: 21 mmol/L — ABNORMAL LOW (ref 22–32)
Calcium: 8.3 mg/dL — ABNORMAL LOW (ref 8.9–10.3)
Chloride: 102 mmol/L (ref 101–111)
Creatinine, Ser: 1.88 mg/dL — ABNORMAL HIGH (ref 0.61–1.24)
GFR calc Af Amer: 44 mL/min — ABNORMAL LOW (ref >60)
GFR calc non Af Amer: 38 mL/min — ABNORMAL LOW (ref >60)
Glucose, Bld: 370 mg/dL — ABNORMAL HIGH (ref 65–99)
Potassium: 4.3 mmol/L (ref 3.5–5.1)
Sodium: 134 mmol/L — ABNORMAL LOW (ref 135–145)
Total Bilirubin: 0.8 mg/dL (ref 0.3–1.2)
Total Protein: 6.5 g/dL (ref 6.5–8.1)

## 2016-06-29 LAB — GLUCOSE, CAPILLARY: Glucose-Capillary: 311 mg/dL — ABNORMAL HIGH (ref 65–99)

## 2016-06-29 LAB — CBC
HCT: 14.8 % — CL (ref 40.0–52.0)
Hemoglobin: 4.4 g/dL — CL (ref 13.0–18.0)
MCH: 20.9 pg — ABNORMAL LOW (ref 26.0–34.0)
MCHC: 30 g/dL — ABNORMAL LOW (ref 32.0–36.0)
MCV: 69.6 fL — ABNORMAL LOW (ref 80.0–100.0)
Platelets: 346 10*3/uL (ref 150–440)
RBC: 2.13 MIL/uL — ABNORMAL LOW (ref 4.40–5.90)
RDW: 20.2 % — ABNORMAL HIGH (ref 11.5–14.5)
WBC: 8.3 10*3/uL (ref 3.8–10.6)

## 2016-06-29 LAB — DIFFERENTIAL
Basophils Absolute: 0.1 10*3/uL (ref 0–0.1)
Basophils Relative: 1 %
Eosinophils Absolute: 0 10*3/uL (ref 0–0.7)
Eosinophils Relative: 1 %
Lymphocytes Relative: 20 %
Lymphs Abs: 1.7 10*3/uL (ref 1.0–3.6)
Monocytes Absolute: 0.8 10*3/uL (ref 0.2–1.0)
Monocytes Relative: 10 %
Neutro Abs: 5.6 10*3/uL (ref 1.4–6.5)
Neutrophils Relative %: 68 %

## 2016-06-29 LAB — APTT: aPTT: 73 seconds — ABNORMAL HIGH (ref 24–36)

## 2016-06-29 LAB — IRON AND TIBC
Iron: 24 ug/dL — ABNORMAL LOW (ref 45–182)
Saturation Ratios: 6 % — ABNORMAL LOW (ref 17.9–39.5)
TIBC: 378 ug/dL (ref 250–450)
UIBC: 354 ug/dL

## 2016-06-29 LAB — TROPONIN I: Troponin I: <0.03 ng/mL (ref <0.03)

## 2016-06-29 LAB — ABO/RH: ABO/RH(D): A POS

## 2016-06-29 LAB — PREPARE RBC (CROSSMATCH)

## 2016-06-29 LAB — LACTATE DEHYDROGENASE: LDH: 207 U/L — ABNORMAL HIGH (ref 98–192)

## 2016-06-29 LAB — HEMOGLOBIN: Hemoglobin: 6.1 g/dL — ABNORMAL LOW (ref 13.0–18.0)

## 2016-06-29 LAB — ETHANOL: Alcohol, Ethyl (B): <5 mg/dL (ref <5)

## 2016-06-29 LAB — FOLATE: Folate: 8.6 ng/mL (ref >5.9)

## 2016-06-29 MED ORDER — PNEUMOCOCCAL VAC POLYVALENT 25 MCG/0.5ML IJ INJ
0.5000 mL | INJECTION | INTRAMUSCULAR | Status: AC
  Administered 2016-06-30: 09:00:00 0.5 mL via INTRAMUSCULAR
  Filled 2016-06-29: qty 0.5

## 2016-06-29 MED ORDER — THIAMINE HCL 100 MG/ML IJ SOLN
100.0000 mg | Freq: Every day | INTRAMUSCULAR | Status: DC
  Administered 2016-06-29: 100 mg via INTRAVENOUS
  Filled 2016-06-29: qty 2

## 2016-06-29 MED ORDER — INSULIN ASPART 100 UNIT/ML ~~LOC~~ SOLN
0.0000 [IU] | Freq: Every day | SUBCUTANEOUS | Status: DC
  Administered 2016-06-29: 23:00:00 4 [IU] via SUBCUTANEOUS
  Administered 2016-06-30: 21:00:00 3 [IU] via SUBCUTANEOUS
  Filled 2016-06-29: qty 4
  Filled 2016-06-29: qty 3

## 2016-06-29 MED ORDER — FOLIC ACID 1 MG PO TABS
1.0000 mg | ORAL_TABLET | Freq: Every day | ORAL | Status: DC
  Administered 2016-06-29 – 2016-07-01 (×3): 1 mg via ORAL
  Filled 2016-06-29 (×4): qty 1

## 2016-06-29 MED ORDER — SODIUM CHLORIDE 0.9 % IV SOLN
10.0000 mL/h | Freq: Once | INTRAVENOUS | Status: AC
  Administered 2016-06-29: 10 mL/h via INTRAVENOUS

## 2016-06-29 MED ORDER — DEXTROSE 5 % IV SOLN
10.0000 mg | Freq: Once | INTRAVENOUS | Status: AC
  Administered 2016-06-29: 10 mg via INTRAVENOUS
  Filled 2016-06-29: qty 1

## 2016-06-29 MED ORDER — SODIUM CHLORIDE 0.9% FLUSH
3.0000 mL | Freq: Two times a day (BID) | INTRAVENOUS | Status: DC
  Administered 2016-06-29 – 2016-07-01 (×4): 3 mL via INTRAVENOUS

## 2016-06-29 MED ORDER — ADULT MULTIVITAMIN W/MINERALS CH
1.0000 | ORAL_TABLET | Freq: Every day | ORAL | Status: DC
  Administered 2016-06-29 – 2016-07-01 (×3): 1 via ORAL
  Filled 2016-06-29 (×3): qty 1

## 2016-06-29 MED ORDER — DOCUSATE SODIUM 100 MG PO CAPS
100.0000 mg | ORAL_CAPSULE | Freq: Two times a day (BID) | ORAL | Status: DC | PRN
Start: 2016-06-29 — End: 2016-07-01

## 2016-06-29 MED ORDER — INFLUENZA VAC SPLIT QUAD 0.5 ML IM SUSY
0.5000 mL | PREFILLED_SYRINGE | INTRAMUSCULAR | Status: AC
  Administered 2016-06-30: 0.5 mL via INTRAMUSCULAR
  Filled 2016-06-29: qty 0.5

## 2016-06-29 MED ORDER — LORAZEPAM 2 MG/ML IJ SOLN
2.0000 mg | INTRAMUSCULAR | Status: DC | PRN
  Administered 2016-06-29: 1 mg via INTRAVENOUS
  Filled 2016-06-29: qty 1

## 2016-06-29 MED ORDER — LORAZEPAM 1 MG PO TABS: 1.0000 mg | ORAL_TABLET | ORAL | Status: DC | PRN

## 2016-06-29 MED ORDER — VITAMIN B-1 100 MG PO TABS
100.0000 mg | ORAL_TABLET | Freq: Every day | ORAL | Status: DC
  Administered 2016-06-30 – 2016-07-01 (×2): 100 mg via ORAL
  Filled 2016-06-29 (×2): qty 1

## 2016-06-29 MED ORDER — INSULIN ASPART 100 UNIT/ML ~~LOC~~ SOLN
0.0000 [IU] | Freq: Three times a day (TID) | SUBCUTANEOUS | Status: DC
  Administered 2016-06-30: 2 [IU] via SUBCUTANEOUS
  Administered 2016-06-30: 13:00:00 9 [IU] via SUBCUTANEOUS
  Administered 2016-06-30: 08:00:00 5 [IU] via SUBCUTANEOUS
  Administered 2016-07-01: 08:00:00 3 [IU] via SUBCUTANEOUS
  Administered 2016-07-01: 5 [IU] via SUBCUTANEOUS
  Filled 2016-06-29: qty 3
  Filled 2016-06-29: qty 9
  Filled 2016-06-29 (×2): qty 5
  Filled 2016-06-29: qty 2

## 2016-06-29 NOTE — ED Notes (Signed)
Patient non verbal in triage. PERRLA. Patient unable to follow commands.

## 2016-06-29 NOTE — ED Notes (Signed)
Last known well per patients wife was Monday.

## 2016-06-29 NOTE — H&P (Addendum)
[facility] [place] NAME: [name]    MR#:  [id number]  DATE OF BIRTH:  [birth year]  DATE OF ADMISSION:  06/29/2016  PRIMARY CARE PHYSICIAN: [name], MD   REQUESTING/REFERRING PHYSICIAN: Archie Balboa  CHIEF COMPLAINT:   Chief Complaint  Patient presents with  . Altered Mental Status    HISTORY OF PRESENT ILLNESS: [name]  is a 56 y.o. male with a known history of Recurrent stroke with right-sided (weakness, taking warfarin at home every day for that, diabetes, hyperlipidemia, hypertension- is a chronic alcoholic and keep drinking a lot of alcohol at home and not eat much, also have some complain of dysphagia as per his wife. She brought to the emergency room today because he is altered mental status and more confused, also complain of her dizziness and losing his balance while trying to get up or walk. His vitals were stable in ER but his INR was noted 11 and his hemoglobin was 4.5.  He did not had active bleeding his stool guaiac was negative CT head and chest x-ray were negative and ER physician did a bedside ultrasound abdomen which did not show any fluid collection. He given vitamin K injection and ordered blood transfusion and given to hospitalist team for further management. Patient is alert and oriented but he is very irritative and has some confusions in between he and his not letting his wife to speak.  PAST MEDICAL HISTORY:   Past Medical History:  Diagnosis Date  . Chronic kidney disease   . Diabetes mellitus without complication ([place])   . Hyperlipidemia   . Hypertension   . Stroke [facility])     PAST SURGICAL HISTORY: History reviewed. No pertinent surgical history.  SOCIAL HISTORY:  Social History  Substance Use Topics  . Smoking status: Current Every Day Smoker    Types: Cigars  . Smokeless tobacco: Not on file  . Alcohol use Not on file    FAMILY HISTORY:  Family History  Problem Relation Age of Onset  . CAD  Brother     DRUG ALLERGIES: No Known Allergies  REVIEW OF SYSTEMS:   CONSTITUTIONAL: No fever,Positive for fatigue or weakness.  EYES: No blurred or double vision.  EARS, NOSE, AND THROAT: No tinnitus or ear pain.  RESPIRATORY: No cough, shortness of breath, wheezing or hemoptysis.  CARDIOVASCULAR: No chest pain, orthopnea, edema.  GASTROINTESTINAL: No nausea, vomiting, diarrhea or abdominal pain.  GENITOURINARY: No dysuria, hematuria.  ENDOCRINE: No polyuria, nocturia,  HEMATOLOGY: No anemia, easy bruising or bleeding SKIN: No rash or lesion. MUSCULOSKELETAL: No joint pain or arthritis.   NEUROLOGIC: No tingling, numbness, weakness.  PSYCHIATRY: No anxiety or depression.   MEDICATIONS AT HOME:  Prior to Admission medications   Medication Sig Start Date End Date Taking? Authorizing Provider  warfarin (COUMADIN) 5 MG tablet Take 5 mg by mouth daily. 06/18/16  Yes Historical Provider, MD      PHYSICAL EXAMINATION:   VITAL SIGNS: Blood pressure (!) 111/92, pulse 98, temperature 98.3 F (36.8 C), temperature source Oral, resp. rate (!) 21, height 6' (1.829 m), weight 74.8 kg (165 lb), SpO2 100 %.  GENERAL:  56 y.o.-year-old patient lying in the bed with no acute distress.  EYES: Pupils equal, round, reactive to light and accommodation. No scleral icterus. Extraocular muscles intact.  HEENT: Head atraumatic, normocephalic. Oropharynx and nasopharynx clear. Conjunctiva pale. NECK:  Supple, no jugular venous distention. No thyroid enlargement, no tenderness.  LUNGS: Normal breath sounds bilaterally,  no wheezing, rales,rhonchi or crepitation. No use of accessory muscles of respiration.  CARDIOVASCULAR: S1, S2 normal. No murmurs, rubs, or gallops.  ABDOMEN: Soft, nontender, nondistended. Bowel sounds present. No organomegaly or mass.  EXTREMITIES: No pedal edema, cyanosis, or clubbing.  NEUROLOGIC: Cranial nerves II through XII are intact. Muscle strength 5/5 in all extremities.  Sensation intact. Gait not checked.  PSYCHIATRIC: The patient is alert and oriented x 3.  SKIN: No obvious rash, lesion, or ulcer.   LABORATORY PANEL:   CBC  Recent Labs Lab 06/29/16 1607  WBC 8.3  HGB 4.4*  HCT 14.8*  PLT 346  MCV 69.6*  MCH 20.9*  MCHC 30.0*  RDW 20.2*  LYMPHSABS 1.7  MONOABS 0.8  EOSABS 0.0  BASOSABS 0.1   ------------------------------------------------------------------------------------------------------------------  Chemistries   Recent Labs Lab 06/29/16 1607  NA 134*  K 4.3  CL 102  CO2 21*  GLUCOSE 370*  BUN 19  CREATININE 1.88*  CALCIUM 8.3*  AST 49*  ALT 34  ALKPHOS 52  BILITOT 0.8   ------------------------------------------------------------------------------------------------------------------ estimated creatinine clearance is 46.4 mL/min (by C-G formula based on SCr of 1.88 mg/dL (H)). ------------------------------------------------------------------------------------------------------------------ No results for input(s): TSH, T4TOTAL, T3FREE, THYROIDAB in the last 72 hours.  Invalid input(s): FREET3   Coagulation profile  Recent Labs Lab 06/29/16 1607  INR 11.02*   ------------------------------------------------------------------------------------------------------------------- No results for input(s): DDIMER in the last 72 hours. -------------------------------------------------------------------------------------------------------------------  Cardiac Enzymes  Recent Labs Lab 06/29/16 1607  TROPONINI <0.03   ------------------------------------------------------------------------------------------------------------------ Invalid input(s): POCBNP  ---------------------------------------------------------------------------------------------------------------  Urinalysis    Component Value Date/Time   COLORURINE Yellow 01/17/2014 1551   APPEARANCEUR Clear 01/17/2014 1551   LABSPEC 1.011 01/17/2014 1551    PHURINE 5.0 01/17/2014 1551   GLUCOSEU Negative 01/17/2014 1551   HGBUR Negative 01/17/2014 1551   BILIRUBINUR Negative 01/17/2014 1551   KETONESUR Negative 01/17/2014 1551   PROTEINUR Negative 01/17/2014 1551   NITRITE Negative 01/17/2014 1551   LEUKOCYTESUR 1+ 01/17/2014 1551     RADIOLOGY: Ct Head Wo Contrast  Result Date: 06/29/2016 CLINICAL DATA:  Confusion and aphasia EXAM: CT HEAD WITHOUT CONTRAST TECHNIQUE: Contiguous axial images were obtained from the base of the skull through the vertex without intravenous contrast. COMPARISON:  Head CT January 17, 2014 and brain MRI August 1927 FINDINGS: Brain: Moderate diffuse atrophy is stable. Asymmetric atrophy in the region of the sylvian fissure on the left compared to the right is stable. There is no intracranial mass, hemorrhage, extra-axial fluid collection, or midline shift. There is evidence of a prior infarct in the mid left frontal lobe. There is a prior small infarct in the head of the caudate nucleus on the right. There is small vessel disease in the centra semiovale bilaterally, more severe on the left than on the right. No new gray-white compartment lesion is evident. No acute infarct appreciable. Vascular: There is no hyperdense vessel. There is calcification in the carotid siphon regions bilaterally. There is also calcification in the distal left vertebral artery. Skull: The bony calvarium appears intact. Sinuses/Orbits: There is mucosal thickening in several ethmoid air cells bilaterally. There is also opacification of the left frontal sinus laterally. Visualized paranasal sinuses elsewhere are clear. There is rightward deviation of the anterior nasal septum. Other: Mastoid air cells are clear. IMPRESSION: Stable atrophy with supratentorial small vessel disease. Prior infarct in the mid left frontal lobe is stable. Prior small infarct in the head of the caudate nucleus on the right is stable. No acute infarct evident. No mass,  hemorrhage, extra-axial fluid collection. There are foci of arterial vascular calcification. There is paranasal sinus disease as summarized above. There is a deviated anterior nasal septum. Electronically Signed   By: Lowella Grip III M.D.   On: 06/29/2016 16:50   Dg Chest Portable 1 View  Result Date: 06/29/2016 CLINICAL DATA:  56 year old male with increased confusion for 2 weeks. Initial encounter. EXAM: PORTABLE CHEST 1 VIEW COMPARISON:  09/26/2011 FINDINGS: Portable AP upright view at 1739 hours. Lower lung volumes. Mediastinal contours remain within normal limits. Visualized tracheal air column is within normal limits. Mild crowding of lung markings but otherwise when allowing for portable technique the lungs are clear. No pneumothorax or pleural effusion. Negative visible bowel gas pattern. No acute osseous abnormality identified. IMPRESSION: Low lung volumes, otherwise no acute cardiopulmonary abnormality. Electronically Signed   By: Genevie Ann M.D.   On: 06/29/2016 17:57    EKG: Orders placed or performed during the hospital encounter of 06/29/16  . ED EKG  . ED EKG    IMPRESSION AND PLAN:  * Confusion,   Also have dizziness and imbalance   Possibly due to chronic alcohol use, he may have thiamine and folate deficiency.   He has history of recurrent stroke but with very high INR. He is less likely but still cannot rule out.   We'll do MRI on brain to rule out any ischemic stroke.   Check thiamine and folate.   Watch for alcohol withdrawal.  * Hypercoagulable state   INR is high, patient takes Coumadin ,last time he checked was more than a month ago.   Hold Coumadin, vitamin K is given by ER, recheck tomorrow.  * Microcytic anemia   There is no source of active bleeding currently   Transfusion ordered by ER physician, recheck hemoglobin tomorrow   Check iron studies and LDH.  * Chronic alcoholism   Watch for alcohol withdrawal.  * Diabetes   He doesn't take any medicine  at home, he has hyperglycemia but I'll keep keeping him nothing by mouth so we will just monitor without any coverage.  * Active smoking   [place] for 4 min to quit.  All the records are reviewed and case discussed with ED provider. Management plans discussed with the patient, family and they are in agreement.  CODE STATUS: Full code  Code Status History    This patient does not have a recorded code status. Please follow your organizational policy for patients in this situation.     Patient's wife was present in the room during my visit.   TOTAL TIME TAKING CARE OF THIS PATIENT: 50 minutes.    Vaughan Basta M.D on 06/29/2016   Between 7am to 6pm - Pager - [phone]  After 6pm go to [email or web address] - password [email or web address] [facility]  Office  [phone]  CC: Primary care physician; [name], MD   Note: This dictation was prepared with Dragon dictation along with smaller phrase technology. Any transcriptional errors that result from this process are unintentional.

## 2016-06-29 NOTE — ED Notes (Signed)
First unit of blood finished. 2nd unit finished and started at 256ml/hr

## 2016-06-29 NOTE — ED Notes (Addendum)
First unit of Blood started at 128ml/hr

## 2016-06-29 NOTE — ED Notes (Signed)
Patient transported to CT 

## 2016-06-29 NOTE — ED Notes (Signed)
This RN tried scanning in emergency blood. Product code will not scan

## 2016-06-29 NOTE — ED Provider Notes (Signed)
[facility] Emergency Department Provider Note   ____________________________________________   I have reviewed the triage vital signs and the nursing notes.   HISTORY  Chief Complaint Altered Mental Status   History limited by: Not Limited   HPI [name] is a 56 y.o. male who presents to the emergency department today because of concerns for confusion, weakness and altered mental status. It is unclear exactly how long this is been going on. The patient states that he started feeling bad yesterday however family states he might of been going on for a couple of weeks. They both are reduced date that he's been having an increasingly hard time with walking. Additionally he has been feeling dizzy. The patient denies any fevers.   Past Medical History:  Diagnosis Date  . Stroke [facility])     There are no active problems to display for this patient.   History reviewed. No pertinent surgical history.  Prior to Admission medications   Not on File    Allergies Patient has no known allergies.  No family history on file.  Social History Social History  Substance Use Topics  . Smoking status: Current Every Day Smoker    Types: Cigars  . Smokeless tobacco: Not on file  . Alcohol use Not on file    Review of Systems  Constitutional: Negative for fever. Cardiovascular: Negative for chest pain. Respiratory: Negative for shortness of breath. Gastrointestinal: Negative for abdominal pain, vomiting and diarrhea. Neurological: Negative for headaches, focal weakness or numbness.  10-point ROS otherwise negative.  ____________________________________________   PHYSICAL EXAM:  VITAL SIGNS: ED Triage Vitals [06/29/16 1618]  Enc Vitals Group     BP      Pulse      Resp      Temp      Temp src      SpO2      Weight 165 lb (74.8 kg)     Height 6' (1.829 m)     Head Circumference      Peak Flow      Pain Score      Pain Loc      Pain Edu?      Excl. in [place]?      Constitutional: Awake and alert, oriented. Somewhat slow to respond.  Eyes: Conjunctivae are normal. Normal extraocular movements. ENT   Head: Normocephalic and atraumatic.   Nose: No congestion/rhinnorhea.   Mouth/Throat: Mucous membranes are moist.   Neck: No stridor. Hematological/Lymphatic/Immunilogical: No cervical lymphadenopathy. Cardiovascular: Tachycardic, regular rhythm.  No murmurs, rubs, or gallops.  Respiratory: Normal respiratory effort without tachypnea nor retractions. Breath sounds are clear and equal bilaterally. No wheezes/rales/rhonchi. Gastrointestinal: Soft and non tender. No rebound. No guarding.  Genitourinary: Deferred Musculoskeletal: Normal range of motion in all extremities. No lower extremity edema. Neurologic:  Slightly slow speech and response. No gross focal neurologic deficits are appreciated.  Skin:  Skin is warm, dry and intact. No rash noted.  ____________________________________________    LABS (pertinent positives/negatives)  Labs Reviewed  PROTIME-INR - Abnormal; Notable for the following:       Result Value   Prothrombin Time 90.0 (*)    INR 11.02 (*)    All other components within normal limits  APTT - Abnormal; Notable for the following:    aPTT 73 (*)    All other components within normal limits  CBC - Abnormal; Notable for the following:    RBC 2.13 (*)    Hemoglobin 4.4 (*)  HCT 14.8 (*)    MCV 69.6 (*)    MCH 20.9 (*)    MCHC 30.0 (*)    RDW 20.2 (*)    All other components within normal limits  COMPREHENSIVE METABOLIC PANEL - Abnormal; Notable for the following:    Sodium 134 (*)    CO2 21 (*)    Glucose, Bld 370 (*)    Creatinine, Ser 1.88 (*)    Calcium 8.3 (*)    Albumin 2.6 (*)    AST 49 (*)    GFR calc non Af Amer 38 (*)    GFR calc Af Amer 44 (*)    All other components within normal limits  DIFFERENTIAL  TROPONIN I  CBG MONITORING, ED  TYPE AND SCREEN  PREPARE RBC  (CROSSMATCH)     ____________________________________________   EKG  I, Nance Pear, attending physician, personally viewed and interpreted this EKG  EKG Time: 1642 Rate: 102 Rhythm: sinus tachycardia Axis: normal Intervals: qtc 435 QRS: narrow ST changes: no st elevation Impression: abnormal ekg   ____________________________________________    RADIOLOGY  CXR  IMPRESSION: Low lung volumes, otherwise no acute cardiopulmonary abnormality.  CT head IMPRESSION: Stable atrophy with supratentorial small vessel disease. Prior infarct in the mid left frontal lobe is stable. Prior small infarct in the head of the caudate nucleus on the right is stable. No acute infarct evident. No mass, hemorrhage, extra-axial fluid collection. There are foci of arterial vascular calcification. There is paranasal sinus disease as summarized above. There is a deviated anterior nasal septum.   ____________________________________________   PROCEDURES  Procedures  CRITICAL CARE Performed by: Nance Pear   Total critical care time: 45 minutes  Critical care time was exclusive of separately billable procedures and treating other patients.  Critical care was necessary to treat or prevent imminent or life-threatening deterioration.  Critical care was time spent personally by me on the following activities: development of treatment plan with patient and/or surrogate as well as nursing, discussions with consultants, evaluation of patient's response to treatment, examination of patient, obtaining history from patient or surrogate, ordering and performing treatments and interventions, ordering and review of laboratory studies, ordering and review of radiographic studies, pulse oximetry and re-evaluation of patient's condition.  ____________________________________________   INITIAL IMPRESSION / ASSESSMENT AND PLAN / ED COURSE  Pertinent labs & imaging results that were  available during my care of the patient were reviewed by me and considered in my medical decision making (see chart for details).  Patient presented to the emergency department today because of concerns for altered mental status and weakness. Blood work did show hemoglobin of 4.4. This point it is unclear where patient is losing blood. Guaiac was negative. Chest x-ray without any fluid. Additionally bedside fast exam did not show any intra-abdominal fluid. Patient is on Coumadin and INR was checked and was found be elevated at 11. At this point no signs of any active bleeding this [facility] was held. However patient was written for vitamin K to help correct INR. The patient was consented and will be transfused emergent blood given altered mental status. The patient will be admitted to the hospital service. ____________________________________________   FINAL CLINICAL IMPRESSION(S) / ED DIAGNOSES  Final diagnoses:  CVA (cerebral infarction)  Cerebral infarction [facility])     Note: This dictation was prepared with Dragon dictation. Any transcriptional errors that result from this process are unintentional     Nance Pear, MD 06/29/16 1842

## 2016-06-29 NOTE — ED Notes (Signed)
Emergent blood completed final unit at 2019.  Patient has had no ill effects from administration and VS are all WNL.

## 2016-06-29 NOTE — ED Triage Notes (Signed)
Patient sent from [facility] for increased aphasia and confusion.  Family states symptoms have been worsening over the past 2 weeks, noticeably worse yesterday.

## 2016-06-30 ENCOUNTER — Inpatient Hospital Stay: Payer: Medicare Other

## 2016-06-30 LAB — BASIC METABOLIC PANEL
Anion gap: 5 (ref 5–15)
BUN: 21 mg/dL — ABNORMAL HIGH (ref 6–20)
CO2: 24 mmol/L (ref 22–32)
Calcium: 8.1 mg/dL — ABNORMAL LOW (ref 8.9–10.3)
Chloride: 104 mmol/L (ref 101–111)
Creatinine, Ser: 1.6 mg/dL — ABNORMAL HIGH (ref 0.61–1.24)
GFR calc Af Amer: 54 mL/min — ABNORMAL LOW (ref >60)
GFR calc non Af Amer: 47 mL/min — ABNORMAL LOW (ref >60)
Glucose, Bld: 295 mg/dL — ABNORMAL HIGH (ref 65–99)
Potassium: 3.9 mmol/L (ref 3.5–5.1)
Sodium: 133 mmol/L — ABNORMAL LOW (ref 135–145)

## 2016-06-30 LAB — HEMOGLOBIN: Hemoglobin: 6.4 g/dL — ABNORMAL LOW (ref 13.0–18.0)

## 2016-06-30 LAB — URINALYSIS, COMPLETE (UACMP) WITH MICROSCOPIC
Bacteria, UA: NONE SEEN
Bilirubin Urine: NEGATIVE
Glucose, UA: >=500 mg/dL — AB
Ketones, ur: NEGATIVE mg/dL
Nitrite: NEGATIVE
Protein, ur: 100 mg/dL — AB
Specific Gravity, Urine: 1.015 (ref 1.005–1.030)
pH: 6 (ref 5.0–8.0)

## 2016-06-30 LAB — URINE DRUG SCREEN, QUALITATIVE (ARMC ONLY)
Amphetamines, Ur Screen: NOT DETECTED
Barbiturates, Ur Screen: NOT DETECTED
Benzodiazepine, Ur Scrn: NOT DETECTED
Cannabinoid 50 Ng, Ur ~~LOC~~: NOT DETECTED
Cocaine Metabolite,Ur ~~LOC~~: NOT DETECTED
MDMA (Ecstasy)Ur Screen: NOT DETECTED
Methadone Scn, Ur: NOT DETECTED
Opiate, Ur Screen: NOT DETECTED
Phencyclidine (PCP) Ur S: NOT DETECTED
Tricyclic, Ur Screen: NOT DETECTED

## 2016-06-30 LAB — CBC
HCT: 19.1 % — ABNORMAL LOW (ref 40.0–52.0)
Hemoglobin: 6.2 g/dL — ABNORMAL LOW (ref 13.0–18.0)
MCH: 23.9 pg — ABNORMAL LOW (ref 26.0–34.0)
MCHC: 32.4 g/dL (ref 32.0–36.0)
MCV: 73.7 fL — ABNORMAL LOW (ref 80.0–100.0)
Platelets: 239 10*3/uL (ref 150–440)
RBC: 2.6 MIL/uL — ABNORMAL LOW (ref 4.40–5.90)
RDW: 21.9 % — ABNORMAL HIGH (ref 11.5–14.5)
WBC: 8 10*3/uL (ref 3.8–10.6)

## 2016-06-30 LAB — PREPARE RBC (CROSSMATCH)

## 2016-06-30 LAB — IRON AND TIBC
Iron: 49 ug/dL (ref 45–182)
Saturation Ratios: 14 % — ABNORMAL LOW (ref 17.9–39.5)
TIBC: 350 ug/dL (ref 250–450)
UIBC: 301 ug/dL

## 2016-06-30 LAB — PROTIME-INR
INR: 1.63
Prothrombin Time: 19.5 seconds — ABNORMAL HIGH (ref 11.4–15.2)

## 2016-06-30 LAB — AMMONIA: Ammonia: 11 umol/L (ref 9–35)

## 2016-06-30 LAB — HEMOGLOBIN AND HEMATOCRIT, BLOOD
HCT: 23.8 % — ABNORMAL LOW (ref 40.0–52.0)
Hemoglobin: 7.7 g/dL — ABNORMAL LOW (ref 13.0–18.0)

## 2016-06-30 LAB — GLUCOSE, CAPILLARY
Glucose-Capillary: 272 mg/dL — ABNORMAL HIGH (ref 65–99)
Glucose-Capillary: 363 mg/dL — ABNORMAL HIGH (ref 65–99)
Glucose-Capillary: 181 mg/dL — ABNORMAL HIGH (ref 65–99)
Glucose-Capillary: 262 mg/dL — ABNORMAL HIGH (ref 65–99)

## 2016-06-30 LAB — FERRITIN: Ferritin: 22 ng/mL — ABNORMAL LOW (ref 24–336)

## 2016-06-30 MED ORDER — NICOTINE 14 MG/24HR TD PT24
14.0000 mg | MEDICATED_PATCH | Freq: Every day | TRANSDERMAL | Status: DC
  Administered 2016-06-30 – 2016-07-01 (×2): 14 mg via TRANSDERMAL
  Filled 2016-06-30 (×2): qty 1

## 2016-06-30 MED ORDER — PANTOPRAZOLE SODIUM 40 MG PO TBEC
40.0000 mg | DELAYED_RELEASE_TABLET | Freq: Two times a day (BID) | ORAL | Status: DC
  Administered 2016-06-30 – 2016-07-01 (×3): 40 mg via ORAL
  Filled 2016-06-30 (×3): qty 1

## 2016-06-30 MED ORDER — SODIUM CHLORIDE 0.9 % IV SOLN
Freq: Once | INTRAVENOUS | Status: AC
  Administered 2016-06-30: 12:00:00 via INTRAVENOUS

## 2016-06-30 NOTE — Consult Note (Signed)
Consultation  Referring Provider:      Primary Care Physician:  [name], MD Primary Gastroenterologist:      San Jetty MD   Reason for Consultation:  Severe anemia. Heme negative stools.        Impression / Plan:   Severe anemia: On coumadin and ibuprofen. Schedule EGD in am. Rule out PUD, gastritis, varices.           HPI:   [name] is a 56 y.o. male with a known history of recurrent stroke on warfarin  diabetes, hyperlipidemia, hypertension, CRI, chronic alcoholic and hepatitis C (biopsy proven stage 2 in 2009 at [facility]) presents with altered mental status and severe anemia. Admits to also taking ibuprofen. HGB 4.4 INR 11. Reports having colonoscopy in the past 3 years but no colonoscopy report found.   Past Medical History:  Diagnosis Date  . Chronic kidney disease   . Diabetes mellitus without complication ([place])   . Hyperlipidemia   . Hypertension   . Stroke [facility])     History reviewed. No pertinent surgical history.  Family History  Problem Relation Age of Onset  . CAD Brother       Social History  Substance Use Topics  . Smoking status: Current Every Day Smoker    Packs/day: 0.25    Types: Cigars  . Smokeless tobacco: Never Used  . Alcohol use 6.0 oz/week    8 Cans of beer, 2 Shots of liquor per week    Prior to Admission medications   Medication Sig Start Date End Date Taking? Authorizing Provider  warfarin (COUMADIN) 5 MG tablet Take 5 mg by mouth daily. 06/18/16  Yes Historical Provider, MD    Current Facility-Administered Medications  Medication Dose Route Frequency Provider Last Rate Last Dose  . 0.9 %  sodium chloride infusion   Intravenous Once Theodoro Grist, MD      . docusate sodium (COLACE) capsule 100 mg  100 mg Oral BID PRN Vaughan Basta, MD      . folic acid (FOLVITE) tablet 1 mg  1 mg Oral Daily Vaughan Basta, MD   1 mg at 06/30/16 0921  . insulin aspart (novoLOG) injection 0-5 Units  0-5 Units Subcutaneous QHS  Harrie Foreman, MD   4 Units at 06/29/16 2250  . insulin aspart (novoLOG) injection 0-9 Units  0-9 Units Subcutaneous TID WC Harrie Foreman, MD   5 Units at 06/30/16 [phone]  . LORazepam (ATIVAN) tablet 1 mg  1 mg Oral Q4H PRN Vaughan Basta, MD       Or  . LORazepam (ATIVAN) injection 2 mg  2 mg Intravenous Q2H PRN Vaughan Basta, MD   1 mg at 06/29/16 1956  . multivitamin with minerals tablet 1 tablet  1 tablet Oral Daily Vaughan Basta, MD   1 tablet at 06/30/16 0921  . nicotine (NICODERM CQ - dosed in mg/24 hours) patch 14 mg  14 mg Transdermal Daily Theodoro Grist, MD   14 mg at 06/30/16 0921  . pantoprazole (PROTONIX) EC tablet 40 mg  40 mg Oral BID Theodoro Grist, MD   40 mg at 06/30/16 0921  . sodium chloride flush (NS) 0.9 % injection 3 mL  3 mL Intravenous Q12H Vaughan Basta, MD   3 mL at 06/30/16 0930  . thiamine (VITAMIN B-1) tablet 100 mg  100 mg Oral Daily Vaughan Basta, MD   100 mg at 06/30/16 7341   Or  . thiamine (B-1) injection 100 mg  100 mg  Intravenous Daily Vaughan Basta, MD   100 mg at 06/29/16 1956    Allergies as of 06/29/2016  . (No Known Allergies)     Review of Systems:    This is positive for those things mentioned in the HPI .       Physical Exam:  Vital signs in last 24 hours: Temp:  [98.2 F (36.8 C)-98.7 F (37.1 C)] 98.6 F (37 C) (02/17 0509) Pulse Rate:  [86-145] 86 (02/17 0509) Resp:  [18-28] 20 (02/17 0509) BP: (111-168)/(67-96) 139/67 (02/17 0509) SpO2:  [97 %-100 %] 100 % (02/17 0509) Weight:  [74.8 kg (165 lb)-77.8 kg (171 lb 9.6 oz)] 77.8 kg (171 lb 9.6 oz) (02/16 2106) Last BM Date: 06/29/16  General:  Well-developed, well-nourished and in no acute distress Eyes:  anicteric. ENT:   Mouth and posterior pharynx free of lesions.  Neck:   supple w/o thyromegaly or mass.  Lungs: Clear to auscultation bilaterally. Heart:  S1S2, no rubs, murmurs, gallops. Abdomen:  soft, non-tender, no  hepatosplenomegaly, hernia, or mass and BS+.  Rectal: Lymph:  no cervical or supraclavicular adenopathy. Extremities:   no edema Skin   no rash. Neuro:  A&O x 3.  Psych:  appropriate mood and  Affect.   Data Reviewed:   LAB RESULTS:  Recent Labs  06/29/16 1607 06/29/16 1941 06/30/16 0223 06/30/16 1020  WBC 8.3  --  8.0  --   HGB 4.4* 6.1* 6.2* 6.4*  HCT 14.8*  --  19.1*  --   PLT 346  --  239  --    BMET  Recent Labs  06/29/16 1607 06/30/16 0223  NA 134* 133*  K 4.3 3.9  CL 102 104  CO2 21* 24  GLUCOSE 370* 295*  BUN 19 21*  CREATININE 1.88* 1.60*  CALCIUM 8.3* 8.1*   LFT  Recent Labs  06/29/16 1607  PROT 6.5  ALBUMIN 2.6*  AST 49*  ALT 34  ALKPHOS 52  BILITOT 0.8   PT/INR  Recent Labs  06/29/16 1607 06/30/16 0223  LABPROT 90.0* 19.5*  INR 11.02* 1.63    STUDIES: Ct Head Wo Contrast  Result Date: 06/29/2016 CLINICAL DATA:  Confusion and aphasia EXAM: CT HEAD WITHOUT CONTRAST TECHNIQUE: Contiguous axial images were obtained from the base of the skull through the vertex without intravenous contrast. COMPARISON:  Head CT January 17, 2014 and brain MRI August 1927 FINDINGS: Brain: Moderate diffuse atrophy is stable. Asymmetric atrophy in the region of the sylvian fissure on the left compared to the right is stable. There is no intracranial mass, hemorrhage, extra-axial fluid collection, or midline shift. There is evidence of a prior infarct in the mid left frontal lobe. There is a prior small infarct in the head of the caudate nucleus on the right. There is small vessel disease in the centra semiovale bilaterally, more severe on the left than on the right. No new gray-white compartment lesion is evident. No acute infarct appreciable. Vascular: There is no hyperdense vessel. There is calcification in the carotid siphon regions bilaterally. There is also calcification in the distal left vertebral artery. Skull: The bony calvarium appears intact.  Sinuses/Orbits: There is mucosal thickening in several ethmoid air cells bilaterally. There is also opacification of the left frontal sinus laterally. Visualized paranasal sinuses elsewhere are clear. There is rightward deviation of the anterior nasal septum. Other: Mastoid air cells are clear. IMPRESSION: Stable atrophy with supratentorial small vessel disease. Prior infarct in the mid left frontal lobe is stable.  Prior small infarct in the head of the caudate nucleus on the right is stable. No acute infarct evident. No mass, hemorrhage, extra-axial fluid collection. There are foci of arterial vascular calcification. There is paranasal sinus disease as summarized above. There is a deviated anterior nasal septum. Electronically Signed   By: Lowella Grip III M.D.   On: 06/29/2016 16:50   Dg Chest Portable 1 View  Result Date: 06/29/2016 CLINICAL DATA:  56 year old male with increased confusion for 2 weeks. Initial encounter. EXAM: PORTABLE CHEST 1 VIEW COMPARISON:  09/26/2011 FINDINGS: Portable AP upright view at 1739 hours. Lower lung volumes. Mediastinal contours remain within normal limits. Visualized tracheal air column is within normal limits. Mild crowding of lung markings but otherwise when allowing for portable technique the lungs are clear. No pneumothorax or pleural effusion. Negative visible bowel gas pattern. No acute osseous abnormality identified. IMPRESSION: Low lung volumes, otherwise no acute cardiopulmonary abnormality. Electronically Signed   By: Genevie Ann M.D.   On: 06/29/2016 17:57     PREVIOUS ENDOSCOPIES:                Thanks   LOS: 1 day   San Jetty MD  @  06/30/2016, 11:57 AM

## 2016-06-30 NOTE — Progress Notes (Signed)
[facility] [place] NAME: Aron Inge    MR#:  [id number]  DATE OF BIRTH:  03/02/1961  SUBJECTIVE:  CHIEF COMPLAINT:   Chief Complaint  Patient presents with  . Altered Mental Status  The patient is 56 year old African American male with past medical history significant for history of alcohol abuse, recurrent stroke with right-sided weakness, on warfarin at home, diabetes, hyperlipidemia, hypertension, who presents to the hospital with complaints of altered mental status, dizziness, losing balance, weakness. In emergency room, he was noted to have significantly elevated INR and his hemoglobin level was found to be 4.4. He denies any abdominal pain. CT of head and chest were negative. In emergency room. The patient was given vitamin K injection and transfuse packed red blood cells. After transfusion, patient's hemoglobin level improved to 6.2. Blood pressure is stable  Review of Systems  Constitutional: Positive for malaise/fatigue. Negative for chills, fever and weight loss.  HENT: Negative for congestion.   Eyes: Negative for blurred vision and double vision.  Respiratory: Negative for cough, sputum production, shortness of breath and wheezing.   Cardiovascular: Negative for chest pain, palpitations, orthopnea, leg swelling and PND.  Gastrointestinal: Positive for nausea. Negative for abdominal pain, blood in stool, constipation, diarrhea and vomiting.  Genitourinary: Negative for dysuria, frequency, hematuria and urgency.  Musculoskeletal: Negative for falls.  Neurological: Negative for dizziness, tremors, focal weakness and headaches.  Endo/Heme/Allergies: Does not bruise/bleed easily.  Psychiatric/Behavioral: Negative for depression. The patient does not have insomnia.     VITAL SIGNS: Blood pressure 114/71, pulse 85, temperature 98.1 F (36.7 C), temperature source Oral, resp. rate 18, height 6' (1.829 m), weight 77.8 kg (171 lb  9.6 oz), SpO2 99 %.  PHYSICAL EXAMINATION:   GENERAL:  56 y.o.-year-old patient lying in the bed with no acute distress.  EYES: Pupils equal, round, reactive to light and accommodation. No scleral icterus. Extraocular muscles intact.  HEENT: Head atraumatic, normocephalic. Oropharynx and nasopharynx clear.  NECK:  Supple, no jugular venous distention. No thyroid enlargement, no tenderness.  LUNGS: Normal breath sounds bilaterally, no wheezing, rales,rhonchi or crepitation. No use of accessory muscles of respiration.  CARDIOVASCULAR: S1, S2 normal. No murmurs, rubs, or gallops.  ABDOMEN: Soft, nontender, nondistended. Bowel sounds present. No organomegaly or mass.  EXTREMITIES: No pedal edema, cyanosis, or clubbing.  NEUROLOGIC: Cranial nerves II through XII are intact. Muscle strength 5/5 in all extremities. Sensation intact. Gait not checked.  PSYCHIATRIC: The patient is alert and oriented x 3. Claims that his wife is noted to be trusted, that she is confused herself, discussion becomes very bizarre, patient gets angry, upset easily. The simple questions SKIN: No obvious rash, lesion, or ulcer.   ORDERS/RESULTS REVIEWED:   CBC  Recent Labs Lab 06/29/16 1607 06/29/16 1941 06/30/16 0223 06/30/16 1020  WBC 8.3  --  8.0  --   HGB 4.4* 6.1* 6.2* 6.4*  HCT 14.8*  --  19.1*  --   PLT 346  --  239  --   MCV 69.6*  --  73.7*  --   MCH 20.9*  --  23.9*  --   MCHC 30.0*  --  32.4  --   RDW 20.2*  --  21.9*  --   LYMPHSABS 1.7  --   --   --   MONOABS 0.8  --   --   --   EOSABS 0.0  --   --   --   BASOSABS  0.1  --   --   --    ------------------------------------------------------------------------------------------------------------------  Chemistries   Recent Labs Lab 06/29/16 1607 06/30/16 0223  NA 134* 133*  K 4.3 3.9  CL 102 104  CO2 21* 24  GLUCOSE 370* 295*  BUN 19 21*  CREATININE 1.88* 1.60*  CALCIUM 8.3* 8.1*  AST 49*  --   ALT 34  --   ALKPHOS 52  --   BILITOT  0.8  --    ------------------------------------------------------------------------------------------------------------------ estimated creatinine clearance is 56.6 mL/min (by C-G formula based on SCr of 1.6 mg/dL (H)). ------------------------------------------------------------------------------------------------------------------ No results for input(s): TSH, T4TOTAL, T3FREE, THYROIDAB in the last 72 hours.  Invalid input(s): FREET3  Cardiac Enzymes  Recent Labs Lab 06/29/16 1607  TROPONINI <0.03   ------------------------------------------------------------------------------------------------------------------ Invalid input(s): POCBNP ---------------------------------------------------------------------------------------------------------------  RADIOLOGY: Ct Head Wo Contrast  Result Date: 06/29/2016 CLINICAL DATA:  Confusion and aphasia EXAM: CT HEAD WITHOUT CONTRAST TECHNIQUE: Contiguous axial images were obtained from the base of the skull through the vertex without intravenous contrast. COMPARISON:  Head CT January 17, 2014 and brain MRI August 1927 FINDINGS: Brain: Moderate diffuse atrophy is stable. Asymmetric atrophy in the region of the sylvian fissure on the left compared to the right is stable. There is no intracranial mass, hemorrhage, extra-axial fluid collection, or midline shift. There is evidence of a prior infarct in the mid left frontal lobe. There is a prior small infarct in the head of the caudate nucleus on the right. There is small vessel disease in the centra semiovale bilaterally, more severe on the left than on the right. No new gray-white compartment lesion is evident. No acute infarct appreciable. Vascular: There is no hyperdense vessel. There is calcification in the carotid siphon regions bilaterally. There is also calcification in the distal left vertebral artery. Skull: The bony calvarium appears intact. Sinuses/Orbits: There is mucosal thickening in several  ethmoid air cells bilaterally. There is also opacification of the left frontal sinus laterally. Visualized paranasal sinuses elsewhere are clear. There is rightward deviation of the anterior nasal septum. Other: Mastoid air cells are clear. IMPRESSION: Stable atrophy with supratentorial small vessel disease. Prior infarct in the mid left frontal lobe is stable. Prior small infarct in the head of the caudate nucleus on the right is stable. No acute infarct evident. No mass, hemorrhage, extra-axial fluid collection. There are foci of arterial vascular calcification. There is paranasal sinus disease as summarized above. There is a deviated anterior nasal septum. Electronically Signed   By: Lowella Grip III M.D.   On: 06/29/2016 16:50   Dg Chest Portable 1 View  Result Date: 06/29/2016 CLINICAL DATA:  56 year old male with increased confusion for 2 weeks. Initial encounter. EXAM: PORTABLE CHEST 1 VIEW COMPARISON:  09/26/2011 FINDINGS: Portable AP upright view at 1739 hours. Lower lung volumes. Mediastinal contours remain within normal limits. Visualized tracheal air column is within normal limits. Mild crowding of lung markings but otherwise when allowing for portable technique the lungs are clear. No pneumothorax or pleural effusion. Negative visible bowel gas pattern. No acute osseous abnormality identified. IMPRESSION: Low lung volumes, otherwise no acute cardiopulmonary abnormality. Electronically Signed   By: Genevie Ann M.D.   On: 06/29/2016 17:57    EKG:  Orders placed or performed during the hospital encounter of 06/29/16  . ED EKG  . ED EKG    ASSESSMENT AND PLAN:  Principal Problem:   Altered mental status Active Problems:   Anemia   Coagulopathy ([place])  #1. Altered mental status  of unclear etiology, seems to be resolving, could be related to alcohol, medications, get urine drug screen, supportive therapy #2 anemia, Hemoccult negative, off iron deficiency, status post transfusion with  improvement of hemoglobin level, transfuse blood. Again, follow hemoglobin level in the morning, gas, otalgia consultation is requested for EGD and colonoscopy during this admission, continue PPIs twice a day, since patient's presentation is concerning for peptic ulcer disease, as patient is using nonsteroidal anti-inflammatory medications and Coumadin, hold Coumadin for now #3. Acute on chronic renal insufficiency, seems to be improving with conservative therapy, follow creatinine in the morning, get urinalysis #4, coagulopathy of unclear etiology, could be related to nutritional deficits, improved with vitamin K, hold Coumadin for now, as patient is non-reliable, alcoholic, and is at risk of bleeding #5. Hyponatremia, follow with therapy #6. Diabetes mellitus with significant hyperglycemia, get hemoglobin A1c, continue sliding scale insulin, patient is nothing by mouth for EGD tomorrow   Management plans discussed with the patient, family and they are in agreement.   DRUG ALLERGIES: No Known Allergies  CODE STATUS:     Code Status Orders        Start     Ordered   06/29/16 2125  Full code  Continuous     06/29/16 2124    Code Status History    Date Active Date Inactive Code Status Order ID Comments User Context   This patient has a current code status but no historical code status.    Advance Directive Documentation   [place] Most Recent Value  Type of Advance Directive  Living will  Pre-existing out of facility DNR order (yellow form or pink MOST form)  No data  "MOST" Form in Place?  No data      TOTAL TIME TAKING CARE OF THIS PATIENT: 40 minutes.  Discussed with gastroenterologist  Brion Sossamon M.D on 06/30/2016 at 3:49 PM  Between 7am to 6pm - Pager - [phone]  After 6pm go to [email or web address] - password [email or web address] [facility]  Office  [phone]  CC: Primary care physician; [name], MD

## 2016-06-30 NOTE — Evaluation (Signed)
Clinical/Bedside Swallow Evaluation Patient Details  Name: [name] MRN: [id number] Date of Birth: [birth year]  Today's Date: 06/30/2016 Time: SLP Start Time (ACUTE ONLY): 65 SLP Stop Time (ACUTE ONLY): 1130 SLP Time Calculation (min) (ACUTE ONLY): 40 min  Past Medical History:  Past Medical History:  Diagnosis Date  . Chronic kidney disease   . Diabetes mellitus without complication ([place])   . Hyperlipidemia   . Hypertension   . Stroke [facility])    Past Surgical History: History reviewed. No pertinent surgical history. HPI:  [name]  is a 56 y.o. male with a known history of Recurrent stroke with right-sided (weakness, taking warfarin at home every day for that, diabetes, hyperlipidemia, hypertension- is a chronic alcoholic and keep drinking a lot of alcohol at home and not eat much, also have some complain of dysphagia as per his wife. She brought to the emergency room today because he is altered mental status and more confused, also complain of her dizziness and losing his balance while trying to get up or walk. He did not had active bleeding his stool guaiac was negative CT head and chest x-ray were negative and ER physician did a bedside ultrasound abdomen which did not show any fluid collection. Pt denies any swallowing difficult, but reports he only eats crackers and chips at home.    Assessment / Plan / Recommendation Clinical Impression  Pt presents w/no apparent dysphagia w/thin liquids and ice cream. Pt currently on a full liquid diet d/t suspected GI bleed, so only thin liquids and ice cream assessed at this evaluation. Pt demonstrated no overt s/s of aspriation w/any tested consistencies. Vocal quality remained clear throughout trials and laryngeal elevation appeared adequate. Oral phase appeared [facility] w/appropriate lip seal around spoon and straw, no pocketing or holding, and adequate oral clearing.  Oral mech exam revealed oral motor abilities to be [facility]. Pt reports that he  consumes a regular diet at home and denies any deficits, although he stated that he only consumes chips and crackers. Pt judged to be mild aspiration risk d/t AMS but improves w/aspiration precuations. Recommend continue w/full liquid diet (per MD recommendation) will f/u re: toleration of diet in 1-3 days and will assess solid consistencies when pt is ready to be advanced.     Aspiration Risk  Mild aspiration risk    Diet Recommendation Thin liquid;Other (Comment) (current full liquid diet)   Liquid Administration via: Cup;Straw Medication Administration: Whole meds with liquid Supervision: Patient able to self feed Compensations: Minimize environmental distractions;Slow rate;Small sips/bites Postural Changes: Seated upright at 90 degrees    Other  Recommendations Oral Care Recommendations: Oral care BID   Follow up Recommendations  (TBD)      Frequency and Duration min 3x week  1 week       Prognosis Prognosis for Safe Diet Advancement: Good Barriers to Reach Goals: Cognitive deficits      Swallow Study   General Date of Onset: 06/29/16 HPI: [name]  is a 56 y.o. male with a known history of Recurrent stroke with right-sided (weakness, taking warfarin at home every day for that, diabetes, hyperlipidemia, hypertension- is a chronic alcoholic and keep drinking a lot of alcohol at home and not eat much, also have some complain of dysphagia as per his wife. She brought to the emergency room today because he is altered mental status and more confused, also complain of her dizziness and losing his balance while trying to get up or walk. He did not  had active bleeding his stool guaiac was negative CT head and chest x-ray were negative and ER physician did a bedside ultrasound abdomen which did not show any fluid collection. Pt denies any swallowing difficult, but reports he only eats crackers and chips at home.  Type of Study: Bedside Swallow Evaluation Previous Swallow Assessment:  None Diet Prior to this Study: Thin liquids;Other (Comment) (Full liquid diet currently but regular diet at home) Temperature Spikes Noted: No Respiratory Status: Room air History of Recent Intubation: No Behavior/Cognition: Alert;Cooperative Oral Cavity Assessment: Within Functional Limits Oral Care Completed by SLP: No Oral Cavity - Dentition: Dentures, top;Dentures, bottom Vision: Functional for self-feeding Self-Feeding Abilities: Able to feed self Patient Positioning: Upright in bed Baseline Vocal Quality: Normal Volitional Cough: Strong Volitional Swallow: Able to elicit    Oral/Motor/Sensory Function Overall Oral Motor/Sensory Function: Within functional limits   Ice Chips Ice chips: Not tested   Thin Liquid Thin Liquid: Within functional limits Presentation: Cup;Straw Other Comments: Pt consumed 4 ounces via cup and straw w/out overt s/s of aspiration.    Nectar Thick Nectar Thick Liquid: Not tested   Honey Thick Honey Thick Liquid: Not tested   Puree Puree: Not tested   Solid   GO   Solid: Not tested        [place],Narcissus Detwiler 06/30/2016,11:29 AM

## 2016-07-01 ENCOUNTER — Encounter
Admission: EM | Disposition: A | Discharge status: Home / Self Care | Payer: Self-pay | Source: Home / Self Care | Attending: Internal Medicine

## 2016-07-01 ENCOUNTER — Inpatient Hospital Stay: Payer: Medicare Other | Admitting: Anesthesiology

## 2016-07-01 ENCOUNTER — Encounter: Payer: Self-pay | Admitting: Anesthesiology

## 2016-07-01 DIAGNOSIS — B3781 Candidal esophagitis: Secondary | ICD-10-CM

## 2016-07-01 DIAGNOSIS — N183 Chronic kidney disease, stage 3 (moderate): Secondary | ICD-10-CM

## 2016-07-01 DIAGNOSIS — N179 Acute kidney failure, unspecified: Secondary | ICD-10-CM

## 2016-07-01 DIAGNOSIS — IMO0002 Reserved for concepts with insufficient information to code with codable children: Secondary | ICD-10-CM

## 2016-07-01 DIAGNOSIS — F1010 Alcohol abuse, uncomplicated: Secondary | ICD-10-CM

## 2016-07-01 DIAGNOSIS — Z9289 Personal history of other medical treatment: Secondary | ICD-10-CM

## 2016-07-01 DIAGNOSIS — E871 Hypo-osmolality and hyponatremia: Secondary | ICD-10-CM

## 2016-07-01 DIAGNOSIS — N184 Chronic kidney disease, stage 4 (severe): Secondary | ICD-10-CM

## 2016-07-01 DIAGNOSIS — Z720 Tobacco use: Secondary | ICD-10-CM

## 2016-07-01 DIAGNOSIS — Z9889 Other specified postprocedural states: Secondary | ICD-10-CM

## 2016-07-01 DIAGNOSIS — E1165 Type 2 diabetes mellitus with hyperglycemia: Secondary | ICD-10-CM

## 2016-07-01 HISTORY — PX: ESOPHAGOGASTRODUODENOSCOPY (EGD) WITH PROPOFOL: SHX5813

## 2016-07-01 LAB — BASIC METABOLIC PANEL
Anion gap: 5 (ref 5–15)
BUN: 14 mg/dL (ref 6–20)
CO2: 26 mmol/L (ref 22–32)
Calcium: 8.2 mg/dL — ABNORMAL LOW (ref 8.9–10.3)
Chloride: 105 mmol/L (ref 101–111)
Creatinine, Ser: 1.5 mg/dL — ABNORMAL HIGH (ref 0.61–1.24)
GFR calc Af Amer: 58 mL/min — ABNORMAL LOW (ref >60)
GFR calc non Af Amer: 50 mL/min — ABNORMAL LOW (ref >60)
Glucose, Bld: 260 mg/dL — ABNORMAL HIGH (ref 65–99)
Potassium: 4.5 mmol/L (ref 3.5–5.1)
Sodium: 136 mmol/L (ref 135–145)

## 2016-07-01 LAB — CBC WITH DIFFERENTIAL/PLATELET
Basophils Absolute: 0 10*3/uL (ref 0–0.1)
Basophils Relative: 0 %
Eosinophils Absolute: 0.1 10*3/uL (ref 0–0.7)
Eosinophils Relative: 2 %
HCT: 24.7 % — ABNORMAL LOW (ref 40.0–52.0)
Hemoglobin: 7.6 g/dL — ABNORMAL LOW (ref 13.0–18.0)
Lymphocytes Relative: 23 %
Lymphs Abs: 1.9 10*3/uL (ref 1.0–3.6)
MCH: 24 pg — ABNORMAL LOW (ref 26.0–34.0)
MCHC: 30.9 g/dL — ABNORMAL LOW (ref 32.0–36.0)
MCV: 77.8 fL — ABNORMAL LOW (ref 80.0–100.0)
Monocytes Absolute: 0.9 10*3/uL (ref 0.2–1.0)
Monocytes Relative: 10 %
Neutro Abs: 5.5 10*3/uL (ref 1.4–6.5)
Neutrophils Relative %: 65 %
Platelets: 254 10*3/uL (ref 150–440)
RBC: 3.17 MIL/uL — ABNORMAL LOW (ref 4.40–5.90)
RDW: 22.6 % — ABNORMAL HIGH (ref 11.5–14.5)
WBC: 8.4 10*3/uL (ref 3.8–10.6)

## 2016-07-01 LAB — PROTIME-INR
INR: 1.29
Prothrombin Time: 16.2 seconds — ABNORMAL HIGH (ref 11.4–15.2)

## 2016-07-01 LAB — TYPE AND SCREEN
ABO/RH(D): A POS
Antibody Screen: NEGATIVE
Unit division: 0
Unit division: 0
Unit division: 0

## 2016-07-01 LAB — HEMOGLOBIN A1C
Hgb A1c MFr Bld: 6.8 % — ABNORMAL HIGH (ref 4.8–5.6)
Hgb A1c MFr Bld: 6.7 % — ABNORMAL HIGH (ref 4.8–5.6)
Mean Plasma Glucose: 148 mg/dL
Mean Plasma Glucose: 146 mg/dL

## 2016-07-01 LAB — GLUCOSE, CAPILLARY
Glucose-Capillary: 246 mg/dL — ABNORMAL HIGH (ref 65–99)
Glucose-Capillary: 278 mg/dL — ABNORMAL HIGH (ref 65–99)

## 2016-07-01 LAB — HIV ANTIBODY (ROUTINE TESTING W REFLEX): HIV Screen 4th Generation wRfx: NONREACTIVE

## 2016-07-01 SURGERY — ESOPHAGOGASTRODUODENOSCOPY (EGD) WITH PROPOFOL
Anesthesia: General

## 2016-07-01 MED ORDER — LACTATED RINGERS IV SOLN
INTRAVENOUS | Status: DC | PRN
  Administered 2016-07-01: 08:00:00 via INTRAVENOUS

## 2016-07-01 MED ORDER — ADULT MULTIVITAMIN W/MINERALS CH: 1.0000 | ORAL_TABLET | Freq: Every day | ORAL | 5 refills | Status: DC

## 2016-07-01 MED ORDER — LIDOCAINE HCL (CARDIAC) 20 MG/ML IV SOLN
INTRAVENOUS | Status: DC | PRN
  Administered 2016-07-01: 100 mg via INTRATRACHEAL

## 2016-07-01 MED ORDER — LIDOCAINE HCL (PF) 2 % IJ SOLN
INTRAMUSCULAR | Status: AC
  Filled 2016-07-01: qty 2

## 2016-07-01 MED ORDER — PROPOFOL 500 MG/50ML IV EMUL
INTRAVENOUS | Status: AC
  Filled 2016-07-01: qty 50

## 2016-07-01 MED ORDER — FLUCONAZOLE 100 MG PO TABS: 100.0000 mg | ORAL_TABLET | Freq: Every day | ORAL | 0 refills | Status: DC

## 2016-07-01 MED ORDER — THIAMINE HCL 100 MG PO TABS: 100.0000 mg | ORAL_TABLET | Freq: Every day | ORAL | 5 refills | Status: DC

## 2016-07-01 MED ORDER — FERROUS GLUCONATE 324 (38 FE) MG PO TABS: 324.0000 mg | ORAL_TABLET | Freq: Two times a day (BID) | ORAL | 3 refills | Status: DC

## 2016-07-01 MED ORDER — DOCUSATE SODIUM 100 MG PO CAPS: 100.0000 mg | ORAL_CAPSULE | Freq: Two times a day (BID) | ORAL | 0 refills | Status: DC | PRN

## 2016-07-01 MED ORDER — FOLIC ACID 1 MG PO TABS
1.0000 mg | ORAL_TABLET | Freq: Every day | ORAL | 5 refills | Status: DC
Start: 2016-07-02 — End: 2017-08-21

## 2016-07-01 MED ORDER — ONDANSETRON HCL 4 MG/2ML IJ SOLN: 4.0000 mg | Freq: Once | INTRAMUSCULAR | Status: DC | PRN

## 2016-07-01 MED ORDER — PANTOPRAZOLE SODIUM 40 MG PO TBEC: 40.0000 mg | DELAYED_RELEASE_TABLET | Freq: Every day | ORAL | 6 refills | Status: DC

## 2016-07-01 MED ORDER — NICOTINE 14 MG/24HR TD PT24: 14.0000 mg | MEDICATED_PATCH | Freq: Every day | TRANSDERMAL | 0 refills | Status: DC

## 2016-07-01 MED ORDER — FLUCONAZOLE 100 MG PO TABS
100.0000 mg | ORAL_TABLET | Freq: Every day | ORAL | Status: DC
  Administered 2016-07-01: 100 mg via ORAL
  Filled 2016-07-01: qty 1

## 2016-07-01 MED ORDER — FENTANYL CITRATE (PF) 100 MCG/2ML IJ SOLN: 25.0000 ug | INTRAMUSCULAR | Status: DC | PRN

## 2016-07-01 MED ORDER — FERROUS GLUCONATE 324 (38 FE) MG PO TABS
324.0000 mg | ORAL_TABLET | Freq: Two times a day (BID) | ORAL | Status: DC
  Filled 2016-07-01: qty 1

## 2016-07-01 MED ORDER — PROPOFOL 10 MG/ML IV BOLUS
INTRAVENOUS | Status: DC | PRN
  Administered 2016-07-01: 100 mg via INTRAVENOUS
  Administered 2016-07-01 (×4): 50 mg via INTRAVENOUS

## 2016-07-01 NOTE — Discharge Instructions (Signed)
Follow up with primary care provider as soon as possible

## 2016-07-01 NOTE — Discharge Summary (Signed)
[facility] NAME: [name]    MR#:  [id number]  DATE OF BIRTH:  1961-04-30  DATE OF ADMISSION:  06/29/2016 ADMITTING PHYSICIAN: Vaughan Basta, MD  DATE OF DISCHARGE: 07/01/2016  1:30 PM  PRIMARY CARE PHYSICIAN: [name], MD     ADMISSION DIAGNOSIS:  Cerebral infarction ([place]) [I63.9] CVA (cerebral infarction) [I63.9]  DISCHARGE DIAGNOSIS:  Principal Problem:   Confusion Active Problems:   Iron deficiency anemia   Coagulopathy (HCC)   History of esophagogastroduodenoscopy (EGD)   History of recent blood transfusion   Acute on chronic renal insufficiency   Alcohol abuse   Monilial esophagitis (HCC)   Tobacco abuse   Hyponatremia   Diabetes ([place])   SECONDARY DIAGNOSIS:   Past Medical History:  Diagnosis Date  . Chronic kidney disease   . Diabetes mellitus without complication ([place])   . Hyperlipidemia   . Hypertension   . Stroke ([place])     .pro HOSPITAL COURSE:  The patient is 56 year old African American male with past medical history significant for history of alcohol abuse, recurrent stroke with right-sided weakness, on warfarin at home, diabetes, hyperlipidemia, hypertension, who presents to the hospital with complaints of altered mental status, dizziness, losing balance, weakness. In emergency room, he was noted to have significantly elevated INR and his hemoglobin level was found to be 4.4. He denied any abdominal pain , bleeding. CT of head and chest  X-ray were unremarkable in themergency room. , patient's labs revealed hyponatremia, elevated creatinine to 1.88, mildly elevated LDH, low iron level with Iron saturation of 6, anemia, significant on coagulopathy was ProTime of 90.0 and INR of 11.02. Coumadin was stopped. The patient was given vitamin K injection and transfused with  packed red blood cells. After transfusion, patient's hemoglobin level improved to 7.6 and remained stable. No  gastrointestinal bleeding was noted . The patient was seen by gastroenterologist and upper endoscopy was performed 07/01/2016, revealing monilial esophagitis, gastritis,  Diflucan 100 mg once daily dose for 7 days was recommended as well as Protonix, as well as stopping Coumadin therapy. The cause of patient's bizarre behavior, he underwent MRI of the brain, revealing bilateral ICA occlusions, old bilateral MCA strokes, left greater than right, no acute intracranial abnormality was found. I discussed patient's case with neurologist, he recommended to stop Coumadin therapy at this time, possibly restarting it as outpatient. Patient's mental status improved back to baseline. The patient was rehydrated and his sodium level improved as well as kidney function back to baseline as well. He was felt to be stable to be discharged home with home health services Discussion by problem:  #1. Altered mental status of unclear etiology, likely delirium due to metabolic derangements, anemia, resolved , negative urine drug screen, supportive therapy is provided. Patient will be discharged home with home health services, including our plan and physical therapist.  #2 Iron deficiency anemia, status post transfusion with improvement of hemoglobin level, status post EGD, relative unremarkable except of gastritis and monilial esophagitis, now off Coumadin therapy, recommended to get colonoscopy as outpatient, continue PPI , discussed with gastroenterologist, Dr. Epimenio Foot.   #3. Acute on chronic renal insufficiency, improved to baseline, urinalysis was concerning for pyuria, however, patient is relatively asymptomatic, get urine cultures as outpatient if patient becomes symptomatic. #4, coagulopathy, acquired, resolved with stopping Coumadin therapy and vitamin K administration, patient is off Coumadin, he is not a good candidate to continue Coumadin therapy due to his history of alcohol abuse  #  5. Hyponatremia, resolved with  therapy #6. Diabetes mellitus with significant hyperglycemia, hemoglobin A1c was 6.7, the patient is to continue diabetic diet and outpatient management DISCHARGE CONDITIONS:   Stable  CONSULTS OBTAINED:  Treatment Team:  San Jetty, MD  DRUG ALLERGIES:  No Known Allergies  DISCHARGE MEDICATIONS:   Discharge Medication List as of 07/01/2016  2:06 PM    START taking these medications   Details  docusate sodium (COLACE) 100 MG capsule Take 1 capsule (100 mg total) by mouth 2 (two) times daily as needed for mild constipation., Starting Sun 07/01/2016, Normal    ferrous gluconate (FERGON) 324 MG tablet Take 1 tablet (324 mg total) by mouth 2 (two) times daily with a meal., Starting Sun 07/01/2016, Normal    fluconazole (DIFLUCAN) 100 MG tablet Take 1 tablet (100 mg total) by mouth daily., Starting Mon 0/34/9179, Normal    folic acid (FOLVITE) 1 MG tablet Take 1 tablet (1 mg total) by mouth daily., Starting Mon 07/02/2016, Normal    Multiple Vitamin (MULTIVITAMIN WITH MINERALS) TABS tablet Take 1 tablet by mouth daily., Starting Mon 07/02/2016, Normal    nicotine (NICODERM CQ - DOSED IN MG/24 HOURS) 14 mg/24hr patch Place 1 patch (14 mg total) onto the skin daily., Starting Mon 07/02/2016, Normal    pantoprazole (PROTONIX) 40 MG tablet Take 1 tablet (40 mg total) by mouth daily., Starting Sun 07/01/2016, Normal    thiamine 100 MG tablet Take 1 tablet (100 mg total) by mouth daily., Starting Mon 07/02/2016, Normal      STOP taking these medications     warfarin (COUMADIN) 5 MG tablet          DISCHARGE INSTRUCTIONS:    The patient is to follow-up with primary care physician within one week after discharge  If you experience worsening of your admission symptoms, develop shortness of breath, life threatening emergency, suicidal or homicidal thoughts you must seek medical attention immediately by calling 911 or calling your MD immediately  if symptoms less severe.  You Must read  complete instructions/literature along with all the possible adverse reactions/side effects for all the Medicines you take and that have been prescribed to you. Take any new Medicines after you have completely understood and accept all the possible adverse reactions/side effects.   Please note  You were cared for by a hospitalist during your hospital stay. If you have any questions about your discharge medications or the care you received while you were in the hospital after you are discharged, you can call the unit and asked to speak with the hospitalist on call if the hospitalist that took care of you is not available. Once you are discharged, your primary care physician will handle any further medical issues. Please note that NO REFILLS for any discharge medications will be authorized once you are discharged, as it is imperative that you return to your primary care physician (or establish a relationship with a primary care physician if you do not have one) for your aftercare needs so that they can reassess your need for medications and monitor your lab values.    Today   CHIEF COMPLAINT:   Chief Complaint  Patient presents with  . Altered Mental Status    HISTORY OF PRESENT ILLNESS:  Juel Bellerose  is a 56 y.o. male with a known history of alcohol abuse, recurrent stroke with right-sided weakness, on warfarin at home, diabetes, hyperlipidemia, hypertension, who presents to the hospital with complaints of altered mental status, dizziness, losing balance,  weakness. In emergency room, he was noted to have significantly elevated INR and his hemoglobin level was found to be 4.4. He denied any abdominal pain , bleeding. CT of head and chest  X-ray were unremarkable in themergency room. , patient's labs revealed hyponatremia, elevated creatinine to 1.88, mildly elevated LDH, low iron level with Iron saturation of 6, anemia, significant on coagulopathy was ProTime of 90.0 and INR of 11.02. Coumadin was  stopped. The patient was given vitamin K injection and transfused with  packed red blood cells. After transfusion, patient's hemoglobin level improved to 7.6 and remained stable. No gastrointestinal bleeding was noted . The patient was seen by gastroenterologist and upper endoscopy was performed 07/01/2016, revealing monilial esophagitis, gastritis,  Diflucan 100 mg once daily dose for 7 days was recommended as well as Protonix, as well as stopping Coumadin therapy. The cause of patient's bizarre behavior, he underwent MRI of the brain, revealing bilateral ICA occlusions, old bilateral MCA strokes, left greater than right, no acute intracranial abnormality was found. I discussed patient's case with neurologist, he recommended to stop Coumadin therapy at this time, possibly restarting it as outpatient. Patient's mental status improved back to baseline. The patient was rehydrated and his sodium level improved as well as kidney function back to baseline as well. He was felt to be stable to be discharged home with home health services Discussion by problem:  #1. Altered mental status of unclear etiology, likely delirium due to metabolic derangements, anemia, resolved , negative urine drug screen, supportive therapy is provided. Patient will be discharged home with home health services, including our plan and physical therapist.  #2 Iron deficiency anemia, status post transfusion with improvement of hemoglobin level, status post EGD, relative unremarkable except of gastritis and monilial esophagitis, now off Coumadin therapy, recommended to get colonoscopy as outpatient, continue PPI , discussed with gastroenterologist, Dr. Epimenio Foot.   #3. Acute on chronic renal insufficiency, improved to baseline, urinalysis was concerning for pyuria, however, patient is relatively asymptomatic, get urine cultures as outpatient if patient becomes symptomatic. #4, coagulopathy, acquired, resolved with stopping Coumadin therapy and  vitamin K administration, patient is off Coumadin, he is not a good candidate to continue Coumadin therapy due to his history of alcohol abuse  #5. Hyponatremia, resolved with therapy #6. Diabetes mellitus with significant hyperglycemia, hemoglobin A1c was 6.7, the patient is to continue diabetic diet and outpatient management   VITAL SIGNS:  Blood pressure 120/69, pulse 87, temperature 97.8 F (36.6 C), temperature source Oral, resp. rate (!) 35, height 6' (1.829 m), weight 77.8 kg (171 lb 9.6 oz), SpO2 100 %.  I/O:   Intake/Output Summary (Last 24 hours) at 07/01/16 1502 Last data filed at 07/01/16 1358  Gross per 24 hour  Intake              540 ml  Output              900 ml  Net             -360 ml    PHYSICAL EXAMINATION:  GENERAL:  56 y.o.-year-old patient lying in the bed with no acute distress.  EYES: Pupils equal, round, reactive to light and accommodation. No scleral icterus. Extraocular muscles intact.  HEENT: Head atraumatic, normocephalic. Oropharynx and nasopharynx clear.  NECK:  Supple, no jugular venous distention. No thyroid enlargement, no tenderness.  LUNGS: Normal breath sounds bilaterally, no wheezing, rales,rhonchi or crepitation. No use of accessory muscles of respiration.  CARDIOVASCULAR: S1, S2 normal.  No murmurs, rubs, or gallops.  ABDOMEN: Soft, non-tender, non-distended. Bowel sounds present. No organomegaly or mass.  EXTREMITIES: No pedal edema, cyanosis, or clubbing.  NEUROLOGIC: Cranial nerves II through XII are intact. Muscle strength 5/5 in all extremities. Sensation intact. Gait not checked.  PSYCHIATRIC: The patient is alert and oriented x 3.  SKIN: No obvious rash, lesion, or ulcer.   DATA REVIEW:   CBC  Recent Labs Lab 07/01/16 0607  WBC 8.4  HGB 7.6*  HCT 24.7*  PLT 254    Chemistries   Recent Labs Lab 06/29/16 1607  07/01/16 0607  NA 134*  < > 136  K 4.3  < > 4.5  CL 102  < > 105  CO2 21*  < > 26  GLUCOSE 370*  < > 260*   BUN 19  < > 14  CREATININE 1.88*  < > 1.50*  CALCIUM 8.3*  < > 8.2*  AST 49*  --   --   ALT 34  --   --   ALKPHOS 52  --   --   BILITOT 0.8  --   --   < > = values in this interval not displayed.  Cardiac Enzymes  Recent Labs Lab 06/29/16 1607  TROPONINI <0.03    Microbiology Results  No results found for this or any previous visit.  RADIOLOGY:  Ct Head Wo Contrast  Result Date: 06/29/2016 CLINICAL DATA:  Confusion and aphasia EXAM: CT HEAD WITHOUT CONTRAST TECHNIQUE: Contiguous axial images were obtained from the base of the skull through the vertex without intravenous contrast. COMPARISON:  Head CT January 17, 2014 and brain MRI August 1927 FINDINGS: Brain: Moderate diffuse atrophy is stable. Asymmetric atrophy in the region of the sylvian fissure on the left compared to the right is stable. There is no intracranial mass, hemorrhage, extra-axial fluid collection, or midline shift. There is evidence of a prior infarct in the mid left frontal lobe. There is a prior small infarct in the head of the caudate nucleus on the right. There is small vessel disease in the centra semiovale bilaterally, more severe on the left than on the right. No new gray-white compartment lesion is evident. No acute infarct appreciable. Vascular: There is no hyperdense vessel. There is calcification in the carotid siphon regions bilaterally. There is also calcification in the distal left vertebral artery. Skull: The bony calvarium appears intact. Sinuses/Orbits: There is mucosal thickening in several ethmoid air cells bilaterally. There is also opacification of the left frontal sinus laterally. Visualized paranasal sinuses elsewhere are clear. There is rightward deviation of the anterior nasal septum. Other: Mastoid air cells are clear. IMPRESSION: Stable atrophy with supratentorial small vessel disease. Prior infarct in the mid left frontal lobe is stable. Prior small infarct in the head of the caudate nucleus on  the right is stable. No acute infarct evident. No mass, hemorrhage, extra-axial fluid collection. There are foci of arterial vascular calcification. There is paranasal sinus disease as summarized above. There is a deviated anterior nasal septum. Electronically Signed   By: Lowella Grip III M.D.   On: 06/29/2016 16:50   Mr Brain Wo Contrast  Result Date: 06/30/2016 CLINICAL DATA:  56 year old male with right side weakness, altered mental status and confusion. Initial encounter. EXAM: MRI HEAD WITHOUT CONTRAST TECHNIQUE: Multiplanar, multiecho pulse sequences of the brain and surrounding structures were obtained without intravenous contrast. COMPARISON:  Noncontrast head CT 06/29/2016.  Brain MRI 12/31/2015 FINDINGS: Brain: No restricted diffusion or evidence of acute  infarction. Chronic left greater than right MCA territory infarcts with encephalomalacia and gliosis appear stable since the August MRI. No new signal abnormality identified. Left brainstem Wallerian degeneration again noted. Thalami, brainstem, and cerebellum otherwise remain normal. No midline shift, mass effect, evidence of mass lesion, ventriculomegaly, extra-axial collection or acute intracranial hemorrhage. Cervicomedullary junction and pituitary are within normal limits. Vascular: Major intracranial vascular flow voids are stable, demonstrating chronic absence of both ICA siphon flow voids (series 7, images 7-10). Vertebrobasilar flow voids are preserved, and as on the 2017 comparison there does seem to be some reconstituted flow in the MCAs and ACAs. Skull and upper cervical spine: Negative. Visualized bone marrow signal is within normal limits. Sinuses/Orbits: Stable and negative. Other: Visible internal auditory structures appear normal. Mastoids remain clear. Negative scalp soft tissues. Incidental probable benign retention cysts of the right maxilla and right pinna incidentally re - identified and stable (series 7 image 2).  IMPRESSION: 1. This patient has chronic BILATERAL ICA Occlusion, but there is no acute infarct or acute intracranial abnormality. 2. Chronic left greater than right MCA territory infarcts with Wallerian degeneration. The posterior circulation brain CED territory remains normal. Electronically Signed   By: Genevie Ann M.D.   On: 06/30/2016 16:33   Dg Chest Portable 1 View  Result Date: 06/29/2016 CLINICAL DATA:  56 year old male with increased confusion for 2 weeks. Initial encounter. EXAM: PORTABLE CHEST 1 VIEW COMPARISON:  09/26/2011 FINDINGS: Portable AP upright view at 1739 hours. Lower lung volumes. Mediastinal contours remain within normal limits. Visualized tracheal air column is within normal limits. Mild crowding of lung markings but otherwise when allowing for portable technique the lungs are clear. No pneumothorax or pleural effusion. Negative visible bowel gas pattern. No acute osseous abnormality identified. IMPRESSION: Low lung volumes, otherwise no acute cardiopulmonary abnormality. Electronically Signed   By: Genevie Ann M.D.   On: 06/29/2016 17:57    EKG:   Orders placed or performed during the hospital encounter of 06/29/16  . ED EKG  . ED EKG      Management plans discussed with the patient, family and they are in agreement.  CODE STATUS:     Code Status Orders        Start     Ordered   06/29/16 2125  Full code  Continuous     06/29/16 2124    Code Status History    Date Active Date Inactive Code Status Order ID Comments User Context   This patient has a current code status but no historical code status.    Advance Directive Documentation   [place] Most Recent Value  Type of Advance Directive  Living will  Pre-existing out of facility DNR order (yellow form or pink MOST form)  No data  "MOST" Form in Place?  No data      TOTAL TIME TAKING CARE OF THIS PATIENT: 40 minutes.    Theodoro Grist M.D on 07/01/2016 at 3:02 PM  Between 7am to 6pm - Pager -  [phone]  After 6pm go to [email or web address] - password [email or web address] [email or web address]  Office  [phone]  CC: Primary care physician; [name], MD

## 2016-07-01 NOTE — Transfer of Care (Signed)
Immediate Anesthesia Transfer of Care Note  [name]  Procedure(s) Performed: Procedure(s): ESOPHAGOGASTRODUODENOSCOPY (EGD) WITH PROPOFOL (N/A)  Patient Location: PACU  Anesthesia Type:General  Level of Consciousness: awake, alert  and oriented  Airway & Oxygen Therapy: Patient Spontanous Breathing and Patient connected to nasal cannula oxygen  Post-op Assessment: Report given to RN and Post -op Vital signs reviewed and stable  Post vital signs: Reviewed and stable  Last Vitals:  Vitals:   06/30/16 2045 07/01/16 0426  BP: 140/83 135/77  Pulse: 88 86  Resp: 20 20  Temp: 37.1 C 36.9 C    Last Pain:  Vitals:   07/01/16 0740  TempSrc:   PainSc: 0-No pain         Complications: No apparent anesthesia complications

## 2016-07-01 NOTE — Anesthesia Postprocedure Evaluation (Signed)
Anesthesia Post Note  [name]  Procedure(s) Performed: Procedure(s) (LRB): ESOPHAGOGASTRODUODENOSCOPY (EGD) WITH PROPOFOL (N/A)  Patient location during evaluation: Endoscopy Anesthesia Type: General Level of consciousness: awake and alert Pain management: pain level controlled Vital Signs Assessment: post-procedure vital signs reviewed and stable Respiratory status: spontaneous breathing, nonlabored ventilation, respiratory function stable and patient connected to nasal cannula oxygen Cardiovascular status: blood pressure returned to baseline and stable Postop Assessment: no signs of nausea or vomiting Anesthetic complications: no     Last Vitals:  Vitals:   07/01/16 0902 07/01/16 0937  BP: 118/71 120/69  Pulse:  87  Resp: (!) 35   Temp: 36.4 C 36.6 C    Last Pain:  Vitals:   07/01/16 0937  TempSrc: Oral  PainSc:                  [name] S

## 2016-07-01 NOTE — Anesthesia Post-op Follow-up Note (Cosign Needed)
Anesthesia QCDR form completed.        

## 2016-07-01 NOTE — Progress Notes (Signed)
CONCERNING: IV to Oral Route Change Policy  RECOMMENDATION: This patient is receiving thiamine by the intravenous route.  Based on criteria approved by the Pharmacy and Therapeutics Committee, the intravenous medication(s) is/are being converted to the equivalent oral dose form(s).   DESCRIPTION: These criteria include:  The patient is eating (either orally or via tube) and/or has been taking other orally administered medications for a least 24 hours  The patient has no evidence of active gastrointestinal bleeding or impaired GI absorption (gastrectomy, short bowel, patient on TNA or NPO).  If you have questions about this conversion, please contact the Pharmacy Department  []   [phone] )  [facility] [x]   [phone] )  [facility] []   [phone] )  [facility] []   [phone] )  [facility] []   [phone] )  [place], [facility] 07/01/2016 1:36 PM

## 2016-07-01 NOTE — Anesthesia Preprocedure Evaluation (Signed)
Anesthesia Evaluation  Patient identified by MRN, date of birth, ID band Patient awake    Reviewed: Allergy & Precautions, NPO status , Patient's Chart, lab work & pertinent test results, reviewed documented beta blocker date and time   Airway Mallampati: III  TM Distance: >3 FB     Dental  (+) Chipped   Pulmonary Current Smoker,           Cardiovascular hypertension, Pt. on medications      Neuro/Psych CVA    GI/Hepatic (+) Hepatitis -  Endo/Other  diabetes, Type 2  Renal/GU Renal disease     Musculoskeletal   Abdominal   Peds  Hematology  (+) anemia ,   Anesthesia Other Findings GI bleed. Mental confusiion. Last hb 7.7.  Reproductive/Obstetrics                             Anesthesia Physical Anesthesia Plan  ASA: III  Anesthesia Plan: General   Post-op Pain Management:    Induction: Intravenous  Airway Management Planned: Natural Airway  Additional Equipment:   Intra-op Plan:   Post-operative Plan:   Informed Consent: I have reviewed the patients History and Physical, chart, labs and discussed the procedure including the risks, benefits and alternatives for the proposed anesthesia with the patient or authorized representative who has indicated his/her understanding and acceptance.     Plan Discussed with: CRNA  Anesthesia Plan Comments:         Anesthesia Quick Evaluation

## 2016-07-01 NOTE — Op Note (Signed)
[facility] Gastroenterology Patient Name: [name] Procedure Date: 07/01/2016 7:57 AM MRN: [id number] Account #: [id number] Date of Birth: [birth year] Admit Type: Inpatient Age: 56 Room: [facility] ENDO ROOM 4 Gender: Male Note Status: Finalized Procedure:            Upper GI endoscopy Indications:          Suspected upper gastrointestinal bleeding in patient                        with unexplained iron deficiency anemia. On coumadin                        and was taking ibuprofen as outpatient. H/x Hepatitis C                        biopsied in 2009. Heme negative on admission. Providers:            San Jetty MD Referring MD:         Vaughan Basta (Referring MD) Medicines:            General Anesthesia Complications:        No immediate complications. Procedure:            Pre-Anesthesia Assessment:                       - Prior to the procedure, a History and Physical was                        performed, and patient medications and allergies were                        reviewed. The patient's tolerance of previous                        anesthesia was also reviewed. The risks and benefits of                        the procedure and the sedation options and risks were                        discussed with the patient. All questions were                        answered, and informed consent was obtained. Prior                        Anticoagulants: The patient has taken Coumadin                        (warfarin), last dose was 3 days prior to procedure.                        ASA Grade Assessment: III - A patient with severe                        systemic disease. After reviewing the risks and                        benefits, the patient was deemed in satisfactory  condition to undergo the procedure.                       After obtaining informed consent, the endoscope was                        passed under direct vision.  Throughout the procedure,                        the patient's blood pressure, pulse, and oxygen                        saturations were monitored continuously. The                        Colonoscope was introduced through the mouth, and                        advanced to the second part of duodenum. The upper GI                        endoscopy was accomplished without difficulty. The                        patient tolerated the procedure well. Findings:      The hypopharynx was normal.      Patchy candidiasis was found in 2/3 of esophagus.      Localized mild inflammation characterized by erythema was found in the       gastric antrum.      The duodenal bulb, second portion of the duodenum and third portion of       the duodenum were normal. Impression:           - Normal hypopharynx.                       - Monilial esophagitis.                       - Gastritis.                       - Normal duodenal bulb, second portion of the duodenum                        and third portion of the duodenum.                       - No specimens collected. Recommendation:       - Resume previous diet.                       - Continue present medications.                       - Use Protonix (pantoprazole) 20 mg PO daily given h/x                        of ibuprofen use.                       - If HGB remains and does not need to be on coumadin  can be discharged for outpatient colonoscopy. Need to                        address if coumadin is necessary given history of                        stroke. If needs coumadin at discharge I would                        recommend colonoscopy prior to discharge.                       - Diflucan (fluconazole) 100 mg PO daily for 7 days for                        candida esophagitis. Procedure Code(s):    --- Professional ---                       [phone], Esophagogastroduodenoscopy, flexible, transoral;                         diagnostic, including collection of specimen(s) by                        brushing or washing, when performed (separate procedure) Diagnosis Code(s):    --- Professional ---                       D50.9, Iron deficiency anemia, unspecified CPT copyright 2016 American Medical Association. All rights reserved. The codes documented in this report are preliminary and upon coder review may  be revised to meet current compliance requirements. San Jetty, MD San Jetty MD,  07/01/2016 8:32:59 AM Number of Addenda: 0 Note Initiated On: 07/01/2016 7:57 AM      [facility]

## 2016-07-01 NOTE — Progress Notes (Signed)
Pt has been discharged home. Discharge papers given and explained to pt and spouse. Pt and spouse verbalized understanding. Meds and f/u appointment reviewed with pt. RX given. Pt escorted in a wheelchair.

## 2016-07-02 ENCOUNTER — Encounter: Payer: Self-pay | Admitting: Gastroenterology

## 2016-07-02 LAB — URINE CULTURE

## 2016-07-02 LAB — GLUCOSE, CAPILLARY: Glucose-Capillary: 207 mg/dL — ABNORMAL HIGH (ref 65–99)

## 2016-07-03 LAB — VITAMIN B1: Vitamin B1 (Thiamine): 69 nmol/L (ref 66.5–200.0)

## 2016-07-04 ENCOUNTER — Encounter (INDEPENDENT_AMBULATORY_CARE_PROVIDER_SITE_OTHER): Payer: Self-pay | Admitting: Vascular Surgery

## 2016-07-19 ENCOUNTER — Encounter (INDEPENDENT_AMBULATORY_CARE_PROVIDER_SITE_OTHER): Payer: Self-pay

## 2016-07-19 ENCOUNTER — Ambulatory Visit (INDEPENDENT_AMBULATORY_CARE_PROVIDER_SITE_OTHER): Payer: Self-pay | Admitting: Vascular Surgery

## 2016-08-02 ENCOUNTER — Encounter (INDEPENDENT_AMBULATORY_CARE_PROVIDER_SITE_OTHER): Payer: Medicare Other

## 2016-08-02 ENCOUNTER — Ambulatory Visit (INDEPENDENT_AMBULATORY_CARE_PROVIDER_SITE_OTHER): Payer: Self-pay | Admitting: Vascular Surgery

## 2016-08-02 ENCOUNTER — Other Ambulatory Visit (INDEPENDENT_AMBULATORY_CARE_PROVIDER_SITE_OTHER): Payer: Self-pay | Admitting: Vascular Surgery

## 2016-08-02 DIAGNOSIS — I6523 Occlusion and stenosis of bilateral carotid arteries: Secondary | ICD-10-CM

## 2016-08-02 DIAGNOSIS — K219 Gastro-esophageal reflux disease without esophagitis: Secondary | ICD-10-CM | POA: Insufficient documentation

## 2016-08-02 DIAGNOSIS — I6529 Occlusion and stenosis of unspecified carotid artery: Secondary | ICD-10-CM | POA: Insufficient documentation

## 2016-08-23 ENCOUNTER — Emergency Department: Payer: Medicare Other

## 2016-08-23 ENCOUNTER — Emergency Department
Admission: EM | Admit: 2016-08-23 | Discharge: 2016-08-23 | Disposition: A | Discharge status: Home / Self Care | Payer: Medicare Other | Attending: Emergency Medicine | Admitting: Emergency Medicine

## 2016-08-23 ENCOUNTER — Encounter: Payer: Self-pay | Admitting: Emergency Medicine

## 2016-08-23 ENCOUNTER — Other Ambulatory Visit: Payer: Self-pay | Admitting: Gastroenterology

## 2016-08-23 ENCOUNTER — Ambulatory Visit: Payer: Medicare Other

## 2016-08-23 DIAGNOSIS — K859 Acute pancreatitis without necrosis or infection, unspecified: Secondary | ICD-10-CM | POA: Insufficient documentation

## 2016-08-23 DIAGNOSIS — R1031 Right lower quadrant pain: Secondary | ICD-10-CM

## 2016-08-23 DIAGNOSIS — N3090 Cystitis, unspecified without hematuria: Secondary | ICD-10-CM | POA: Diagnosis not present

## 2016-08-23 DIAGNOSIS — I129 Hypertensive chronic kidney disease with stage 1 through stage 4 chronic kidney disease, or unspecified chronic kidney disease: Secondary | ICD-10-CM | POA: Insufficient documentation

## 2016-08-23 DIAGNOSIS — E1122 Type 2 diabetes mellitus with diabetic chronic kidney disease: Secondary | ICD-10-CM | POA: Diagnosis not present

## 2016-08-23 DIAGNOSIS — D509 Iron deficiency anemia, unspecified: Secondary | ICD-10-CM

## 2016-08-23 DIAGNOSIS — Z79899 Other long term (current) drug therapy: Secondary | ICD-10-CM | POA: Insufficient documentation

## 2016-08-23 DIAGNOSIS — N189 Chronic kidney disease, unspecified: Secondary | ICD-10-CM | POA: Insufficient documentation

## 2016-08-23 DIAGNOSIS — K921 Melena: Secondary | ICD-10-CM

## 2016-08-23 DIAGNOSIS — F17290 Nicotine dependence, other tobacco product, uncomplicated: Secondary | ICD-10-CM | POA: Insufficient documentation

## 2016-08-23 DIAGNOSIS — R1032 Left lower quadrant pain: Secondary | ICD-10-CM

## 2016-08-23 LAB — COMPREHENSIVE METABOLIC PANEL
ALT: 53 U/L (ref 17–63)
AST: 90 U/L — ABNORMAL HIGH (ref 15–41)
Albumin: 3 g/dL — ABNORMAL LOW (ref 3.5–5.0)
Alkaline Phosphatase: 108 U/L (ref 38–126)
Anion gap: 10 (ref 5–15)
BUN: 12 mg/dL (ref 6–20)
CO2: 23 mmol/L (ref 22–32)
Calcium: 9.1 mg/dL (ref 8.9–10.3)
Chloride: 103 mmol/L (ref 101–111)
Creatinine, Ser: 1.46 mg/dL — ABNORMAL HIGH (ref 0.61–1.24)
GFR calc Af Amer: >60 mL/min (ref >60)
GFR calc non Af Amer: 52 mL/min — ABNORMAL LOW (ref >60)
Glucose, Bld: 246 mg/dL — ABNORMAL HIGH (ref 65–99)
Potassium: 4 mmol/L (ref 3.5–5.1)
Sodium: 136 mmol/L (ref 135–145)
Total Bilirubin: 1 mg/dL (ref 0.3–1.2)
Total Protein: 7.1 g/dL (ref 6.5–8.1)

## 2016-08-23 LAB — URINALYSIS, COMPLETE (UACMP) WITH MICROSCOPIC
Bacteria, UA: NONE SEEN
Bilirubin Urine: NEGATIVE
Glucose, UA: 150 mg/dL — AB
Ketones, ur: 20 mg/dL — AB
Nitrite: NEGATIVE
Protein, ur: 100 mg/dL — AB
Specific Gravity, Urine: 1.029 (ref 1.005–1.030)
pH: 5 (ref 5.0–8.0)

## 2016-08-23 LAB — CBC
HCT: 38.2 % — ABNORMAL LOW (ref 40.0–52.0)
Hemoglobin: 11.9 g/dL — ABNORMAL LOW (ref 13.0–18.0)
MCH: 24 pg — ABNORMAL LOW (ref 26.0–34.0)
MCHC: 31.2 g/dL — ABNORMAL LOW (ref 32.0–36.0)
MCV: 76.7 fL — ABNORMAL LOW (ref 80.0–100.0)
Platelets: 260 10*3/uL (ref 150–440)
RBC: 4.98 MIL/uL (ref 4.40–5.90)
RDW: 21.2 % — ABNORMAL HIGH (ref 11.5–14.5)
WBC: 6.4 10*3/uL (ref 3.8–10.6)

## 2016-08-23 LAB — LIPASE, BLOOD: Lipase: 80 U/L — ABNORMAL HIGH (ref 11–51)

## 2016-08-23 MED ORDER — OXYCODONE-ACETAMINOPHEN 5-325 MG PO TABS
1.0000 | ORAL_TABLET | Freq: Once | ORAL | Status: AC
  Administered 2016-08-23: 1 via ORAL
  Filled 2016-08-23: qty 1

## 2016-08-23 MED ORDER — OXYCODONE-ACETAMINOPHEN 5-325 MG PO TABS: 1.0000 | ORAL_TABLET | Freq: Four times a day (QID) | ORAL | 0 refills | Status: DC | PRN

## 2016-08-23 MED ORDER — ONDANSETRON HCL 4 MG/2ML IJ SOLN
4.0000 mg | Freq: Once | INTRAMUSCULAR | Status: AC
  Administered 2016-08-23: 4 mg via INTRAVENOUS
  Filled 2016-08-23: qty 2

## 2016-08-23 MED ORDER — CEPHALEXIN 500 MG PO CAPS: 500.0000 mg | ORAL_CAPSULE | Freq: Two times a day (BID) | ORAL | 0 refills | Status: DC

## 2016-08-23 MED ORDER — IOPAMIDOL (ISOVUE-300) INJECTION 61%
100.0000 mL | Freq: Once | INTRAVENOUS | Status: AC | PRN
  Administered 2016-08-23: 100 mL via INTRAVENOUS
  Filled 2016-08-23: qty 100

## 2016-08-23 MED ORDER — SODIUM CHLORIDE 0.9 % IV BOLUS (SEPSIS)
1000.0000 mL | Freq: Once | INTRAVENOUS | Status: AC
  Administered 2016-08-23: 1000 mL via INTRAVENOUS

## 2016-08-23 MED ORDER — ONDANSETRON 4 MG PO TBDP: 4.0000 mg | ORAL_TABLET | Freq: Three times a day (TID) | ORAL | 0 refills | Status: DC | PRN

## 2016-08-23 MED ORDER — IOPAMIDOL (ISOVUE-300) INJECTION 61%
30.0000 mL | Freq: Once | INTRAVENOUS | Status: AC | PRN
  Administered 2016-08-23: 30 mL via ORAL
  Filled 2016-08-23: qty 30

## 2016-08-23 NOTE — ED Provider Notes (Signed)
[facility] Emergency Department Provider Note  ____________________________________________  Time seen: Approximately 4:46 PM  I have reviewed the triage vital signs and the nursing notes.   HISTORY  Chief Complaint Abdominal Pain    HPI [name] is a 56 y.o. male who complains of right lower quadrant abdominal pain for the past week. No nausea vomiting or diarrhea. Tolerating oral intake just fine. No fevers or chills. No dysuria frequency or urgency.No aggravating or alleviating factors. Nonradiating. Moderate intensity, waxing and waning gradual onset.     Past Medical History:  Diagnosis Date  . Chronic kidney disease   . Diabetes mellitus without complication ([place])   . Hyperlipidemia   . Hypertension   . Stroke [facility])      Patient Active Problem List   Diagnosis Date Noted  . Carotid stenosis 08/02/2016  . GERD (gastroesophageal reflux disease) 08/02/2016  . History of esophagogastroduodenoscopy (EGD) 07/01/2016  . History of recent blood transfusion 07/01/2016  . Acute on chronic renal insufficiency 07/01/2016  . Hyponatremia 07/01/2016  . Diabetes ([place]) 07/01/2016  . Alcohol abuse 07/01/2016  . Monilial esophagitis ([place]) 07/01/2016  . Tobacco abuse 07/01/2016  . Confusion 06/29/2016  . Iron deficiency anemia 06/29/2016  . Coagulopathy ([place]) 06/29/2016     Past Surgical History:  Procedure Laterality Date  . ESOPHAGOGASTRODUODENOSCOPY (EGD) WITH PROPOFOL N/A 07/01/2016   Procedure: ESOPHAGOGASTRODUODENOSCOPY (EGD) WITH PROPOFOL;  Surgeon: San Jetty, MD;  Location: [facility] ENDOSCOPY;  Service: General;  Laterality: N/A;     Prior to Admission medications   Medication Sig Start Date End Date Taking? Authorizing Provider  cephALEXin (KEFLEX) 500 MG capsule Take 1 capsule (500 mg total) by mouth 2 (two) times daily. 08/23/16   Carrie Mew, MD  docusate sodium (COLACE) 100 MG capsule Take 1 capsule (100 mg total) by mouth 2  (two) times daily as needed for mild constipation. 07/01/16   Theodoro Grist, MD  ferrous gluconate (FERGON) 324 MG tablet Take 1 tablet (324 mg total) by mouth 2 (two) times daily with a meal. 07/01/16   Theodoro Grist, MD  fluconazole (DIFLUCAN) 100 MG tablet Take 1 tablet (100 mg total) by mouth daily. 07/02/16   Theodoro Grist, MD  folic acid (FOLVITE) 1 MG tablet Take 1 tablet (1 mg total) by mouth daily. 07/02/16   Theodoro Grist, MD  Multiple Vitamin (MULTIVITAMIN WITH MINERALS) TABS tablet Take 1 tablet by mouth daily. 07/02/16   Theodoro Grist, MD  nicotine (NICODERM CQ - DOSED IN MG/24 HOURS) 14 mg/24hr patch Place 1 patch (14 mg total) onto the skin daily. 07/02/16   Theodoro Grist, MD  ondansetron (ZOFRAN ODT) 4 MG disintegrating tablet Take 1 tablet (4 mg total) by mouth every 8 (eight) hours as needed for nausea or vomiting. 08/23/16   Carrie Mew, MD  oxyCODONE-acetaminophen (ROXICET) 5-325 MG tablet Take 1 tablet by mouth every 6 (six) hours as needed for severe pain. 08/23/16   Carrie Mew, MD  pantoprazole (PROTONIX) 40 MG tablet Take 1 tablet (40 mg total) by mouth daily. 07/01/16   Theodoro Grist, MD  thiamine 100 MG tablet Take 1 tablet (100 mg total) by mouth daily. 07/02/16   Theodoro Grist, MD     Allergies Patient has no known allergies.   Family History  Problem Relation Age of Onset  . CAD Brother     Social History Social History  Substance Use Topics  . Smoking status: Current Every Day Smoker    Packs/day: 0.25  Types: Cigars  . Smokeless tobacco: Never Used  . Alcohol use 6.0 oz/week    8 Cans of beer, 2 Shots of liquor per week    Review of Systems  Constitutional:   No fever or chills.  ENT:   No sore throat. No rhinorrhea. Cardiovascular:   No chest pain. Respiratory:   No dyspnea or cough. Gastrointestinal:   Positive as above abdominal pain without vomiting and diarrhea.  Genitourinary:   Negative for dysuria or difficulty  urinating. Musculoskeletal:   Negative for focal pain or swelling Neurological:   Negative for headaches 10-point ROS otherwise negative.  ____________________________________________   PHYSICAL EXAM:  VITAL SIGNS: ED Triage Vitals  Enc Vitals Group     BP 08/23/16 1232 (!) 132/96     Pulse Rate 08/23/16 1232 (!) 114     Resp 08/23/16 1232 18     Temp 08/23/16 1232 98.4 F (36.9 C)     Temp Source 08/23/16 1232 Oral     SpO2 08/23/16 1232 100 %     Weight 08/23/16 1232 175 lb (79.4 kg)     Height 08/23/16 1232 5\' 11"  (1.803 m)     Head Circumference --      Peak Flow --      Pain Score 08/23/16 1231 8     Pain Loc --      Pain Edu? --      Excl. in [place]? --     Vital signs reviewed, nursing assessments reviewed.   Constitutional:   Alert and oriented. Well appearing and in no distress. Eyes:   No scleral icterus. No conjunctival pallor. PERRL. EOMI.  No nystagmus. ENT   Head:   Normocephalic and atraumatic.   Nose:   No congestion/rhinnorhea. No septal hematoma   Mouth/Throat:   MMM, no pharyngeal erythema. No peritonsillar mass.    Neck:   No stridor. No SubQ emphysema. No meningismus. Hematological/Lymphatic/Immunilogical:   No cervical lymphadenopathy. Cardiovascular:   RRR. Symmetric bilateral radial and DP pulses.  No murmurs.  Respiratory:   Normal respiratory effort without tachypnea nor retractions. Breath sounds are clear and equal bilaterally. No wheezes/rales/rhonchi. Gastrointestinal:   Soft With epigastric tenderness. Non distended. There is no CVA tenderness.  No rebound, rigidity, or guarding. Genitourinary:   deferred Musculoskeletal:   Normal range of motion in all extremities. No joint effusions.  No lower extremity tenderness.  No edema. Neurologic:   Normal speech and language.  CN 2-10 normal. Motor grossly intact. No gross focal neurologic deficits are appreciated.  Skin:    Skin is warm, dry and intact. No rash noted.  No petechiae,  purpura, or bullae.  ____________________________________________    LABS (pertinent positives/negatives) (all labs ordered are listed, but only abnormal results are displayed) Labs Reviewed  LIPASE, BLOOD - Abnormal; Notable for the following:       Result Value   Lipase 80 (*)    All other components within normal limits  COMPREHENSIVE METABOLIC PANEL - Abnormal; Notable for the following:    Glucose, Bld 246 (*)    Creatinine, Ser 1.46 (*)    Albumin 3.0 (*)    AST 90 (*)    GFR calc non Af Amer 52 (*)    All other components within normal limits  CBC - Abnormal; Notable for the following:    Hemoglobin 11.9 (*)    HCT 38.2 (*)    MCV 76.7 (*)    MCH 24.0 (*)    MCHC 31.2 (*)  RDW 21.2 (*)    All other components within normal limits  URINALYSIS, COMPLETE (UACMP) WITH MICROSCOPIC - Abnormal; Notable for the following:    Color, Urine AMBER (*)    APPearance CLOUDY (*)    Glucose, UA 150 (*)    Hgb urine dipstick SMALL (*)    Ketones, ur 20 (*)    Protein, ur 100 (*)    Leukocytes, UA TRACE (*)    Squamous Epithelial / LPF 0-5 (*)    All other components within normal limits  URINE CULTURE   ____________________________________________   EKG    ____________________________________________    RADIOLOGY  Ct Abdomen Pelvis W Contrast  Result Date: 08/23/2016 CLINICAL DATA:  Right lower quadrant abdominal pain over the last week. EXAM: CT ABDOMEN AND PELVIS WITH CONTRAST TECHNIQUE: Multidetector CT imaging of the abdomen and pelvis was performed using the standard protocol following bolus administration of intravenous contrast. CONTRAST:  151mL ISOVUE-300 IOPAMIDOL (ISOVUE-300) INJECTION 61% COMPARISON:  11/05/2011 FINDINGS: Lower chest: Normal Hepatobiliary: Fatty change of the liver. No focal lesion. No calcified gallstones. Pancreas: Probable early pancreatitis. Mild edema around the pancreas, particularly the head and proximal body. Spleen: Normal  Adrenals/Urinary Tract: Adrenal glands are normal. Kidneys are normal. Stomach/Bowel: No abnormal bowel finding. Vascular/Lymphatic: Aortic atherosclerosis. No aneurysm. IVC is normal. No retroperitoneal adenopathy. Reproductive: Normal Other: No free fluid or air. Musculoskeletal: Ordinary lumbar degenerative changes. IMPRESSION: Probable acute pancreatitis. Mild edematous change surrounding the head and proximal body of the pancreas. Fatty change of the liver.  No calcified gallstones. Aortic atherosclerosis. Electronically Signed   By: Nelson Chimes M.D.   On: 08/23/2016 16:09    ____________________________________________   PROCEDURES Procedures  ____________________________________________   INITIAL IMPRESSION / ASSESSMENT AND PLAN / ED COURSE  Pertinent labs & imaging results that were available during my care of the patient were reviewed by me and considered in my medical decision making (see chart for details).  Patient presents with complaint of right lower quadrant pain, finding of epigastric tenderness. Elevated lipase. CT consistent with early mild pancreatitis. Patient is calm and comfortable, tolerating oral intake. I'll prescribe him some Zofran and Percocet for symptom control and have him follow up with primary care. I offered admission but he wishes to try managing this outpatient. Gave him recommendations on diet modification in the short-term. Return precautions given. Otherwise no evidence of serious abdominal pathology. No evidence of appendicitis. Urinalysis suggestive of UTI, so despite the lack of symptoms, because the patient has diabetes, I'll treat this with Keflex. I'm concerned that Diflucan that he took before may have precipitated the pancreatitis so we will avoid that for now even though he had candidal esophagitis in the past. He denies any difficulty swallowing or painful swallowing or throat pain. Urine culture sent.          ____________________________________________   FINAL CLINICAL IMPRESSION(S) / ED DIAGNOSES  Final diagnoses:  Acute pancreatitis, unspecified complication status, unspecified pancreatitis type  Cystitis      New Prescriptions   CEPHALEXIN (KEFLEX) 500 MG CAPSULE    Take 1 capsule (500 mg total) by mouth 2 (two) times daily.   ONDANSETRON (ZOFRAN ODT) 4 MG DISINTEGRATING TABLET    Take 1 tablet (4 mg total) by mouth every 8 (eight) hours as needed for nausea or vomiting.   OXYCODONE-ACETAMINOPHEN (ROXICET) 5-325 MG TABLET    Take 1 tablet by mouth every 6 (six) hours as needed for severe pain.     Portions of this  note were generated with dragon dictation software. Dictation errors may occur despite best attempts at proofreading.    Carrie Mew, MD 08/23/16 [id number]

## 2016-08-23 NOTE — ED Notes (Signed)
CT notified done with contrast.

## 2016-08-23 NOTE — ED Notes (Signed)
Patient wanted to know if this pill "would get him high." Explained to patient that if taken correctly and at the dose prescribed it should not. Patient and family given instructions that next oxycodone would be due at 10:45pm if pain warranted it. Voiced understanding. Also instructed patient not to drive or take extra tylenol.

## 2016-08-23 NOTE — ED Triage Notes (Signed)
Pt sent from [facility] for evaluation of RLQ abdominal pain for over one week. Pt denies NVD. Pt states has been taking a 10 mg prescription pill for pain but does not know the name. Pt states the medication has not been helping.

## 2016-08-25 LAB — URINE CULTURE: Culture: NO GROWTH

## 2016-10-01 ENCOUNTER — Other Ambulatory Visit: Payer: Self-pay | Admitting: Gastroenterology

## 2016-10-01 ENCOUNTER — Other Ambulatory Visit: Payer: Self-pay | Admitting: Internal Medicine

## 2016-10-01 DIAGNOSIS — R945 Abnormal results of liver function studies: Secondary | ICD-10-CM

## 2016-10-15 ENCOUNTER — Ambulatory Visit
Admission: RE | Admit: 2016-10-15 | Discharge: 2016-10-15 | Disposition: A | Discharge status: Home / Self Care | Payer: Medicare Other | Source: Ambulatory Visit | Attending: Internal Medicine | Admitting: Internal Medicine

## 2016-10-15 DIAGNOSIS — K760 Fatty (change of) liver, not elsewhere classified: Secondary | ICD-10-CM | POA: Insufficient documentation

## 2016-10-15 DIAGNOSIS — R945 Abnormal results of liver function studies: Secondary | ICD-10-CM | POA: Insufficient documentation

## 2016-10-15 DIAGNOSIS — I700 Atherosclerosis of aorta: Secondary | ICD-10-CM | POA: Insufficient documentation

## 2016-10-15 MED ORDER — IOPAMIDOL (ISOVUE-300) INJECTION 61%
100.0000 mL | Freq: Once | INTRAVENOUS | Status: AC | PRN
  Administered 2016-10-15: 100 mL via INTRAVENOUS

## 2017-03-15 ENCOUNTER — Ambulatory Visit: Payer: Self-pay | Admitting: Urology

## 2017-07-02 ENCOUNTER — Encounter: Payer: Self-pay | Admitting: Emergency Medicine

## 2017-07-02 ENCOUNTER — Emergency Department
Admission: EM | Admit: 2017-07-02 | Discharge: 2017-07-02 | Disposition: A | Discharge status: Home / Self Care | Payer: Medicare Other | Attending: Emergency Medicine | Admitting: Emergency Medicine

## 2017-07-02 ENCOUNTER — Other Ambulatory Visit: Payer: Self-pay

## 2017-07-02 DIAGNOSIS — Z79899 Other long term (current) drug therapy: Secondary | ICD-10-CM | POA: Diagnosis not present

## 2017-07-02 DIAGNOSIS — R109 Unspecified abdominal pain: Secondary | ICD-10-CM | POA: Diagnosis not present

## 2017-07-02 DIAGNOSIS — R195 Other fecal abnormalities: Secondary | ICD-10-CM | POA: Diagnosis present

## 2017-07-02 DIAGNOSIS — N181 Chronic kidney disease, stage 1: Secondary | ICD-10-CM | POA: Insufficient documentation

## 2017-07-02 DIAGNOSIS — G8929 Other chronic pain: Secondary | ICD-10-CM | POA: Insufficient documentation

## 2017-07-02 DIAGNOSIS — F17290 Nicotine dependence, other tobacco product, uncomplicated: Secondary | ICD-10-CM | POA: Diagnosis not present

## 2017-07-02 DIAGNOSIS — Z8673 Personal history of transient ischemic attack (TIA), and cerebral infarction without residual deficits: Secondary | ICD-10-CM | POA: Diagnosis not present

## 2017-07-02 DIAGNOSIS — I129 Hypertensive chronic kidney disease with stage 1 through stage 4 chronic kidney disease, or unspecified chronic kidney disease: Secondary | ICD-10-CM | POA: Insufficient documentation

## 2017-07-02 DIAGNOSIS — K921 Melena: Secondary | ICD-10-CM

## 2017-07-02 LAB — COMPREHENSIVE METABOLIC PANEL
ALT: 14 U/L — ABNORMAL LOW (ref 17–63)
AST: 22 U/L (ref 15–41)
Albumin: 3.2 g/dL — ABNORMAL LOW (ref 3.5–5.0)
Alkaline Phosphatase: 85 U/L (ref 38–126)
Anion gap: 9 (ref 5–15)
BUN: 13 mg/dL (ref 6–20)
CO2: 24 mmol/L (ref 22–32)
Calcium: 9.1 mg/dL (ref 8.9–10.3)
Chloride: 101 mmol/L (ref 101–111)
Creatinine, Ser: 1.63 mg/dL — ABNORMAL HIGH (ref 0.61–1.24)
GFR calc Af Amer: 52 mL/min — ABNORMAL LOW (ref >60)
GFR calc non Af Amer: 45 mL/min — ABNORMAL LOW (ref >60)
Glucose, Bld: 395 mg/dL — ABNORMAL HIGH (ref 65–99)
Potassium: 5.1 mmol/L (ref 3.5–5.1)
Sodium: 134 mmol/L — ABNORMAL LOW (ref 135–145)
Total Bilirubin: 0.6 mg/dL (ref 0.3–1.2)
Total Protein: 7.1 g/dL (ref 6.5–8.1)

## 2017-07-02 LAB — TYPE AND SCREEN
ABO/RH(D): A POS
Antibody Screen: NEGATIVE

## 2017-07-02 LAB — CBC
HCT: 40.2 % (ref 40.0–52.0)
Hemoglobin: 13.5 g/dL (ref 13.0–18.0)
MCH: 32.3 pg (ref 26.0–34.0)
MCHC: 33.5 g/dL (ref 32.0–36.0)
MCV: 96.5 fL (ref 80.0–100.0)
Platelets: 191 10*3/uL (ref 150–440)
RBC: 4.17 MIL/uL — ABNORMAL LOW (ref 4.40–5.90)
RDW: 12.4 % (ref 11.5–14.5)
WBC: 4.3 10*3/uL (ref 3.8–10.6)

## 2017-07-02 MED ORDER — SUCRALFATE 1 G PO TABS
ORAL_TABLET | ORAL | Status: AC
  Filled 2017-07-02: qty 1

## 2017-07-02 MED ORDER — SUCRALFATE 1 G PO TABS
1.0000 g | ORAL_TABLET | Freq: Once | ORAL | Status: AC
  Administered 2017-07-02: 1 g via ORAL

## 2017-07-02 MED ORDER — SUCRALFATE 1 G PO TABS: 1.0000 g | ORAL_TABLET | Freq: Four times a day (QID) | ORAL | 0 refills | Status: DC

## 2017-07-02 NOTE — ED Triage Notes (Signed)
X2 to 3 days of dark sttol , abd pain /burning sensation , sent by PCP to eval for GI bleed

## 2017-07-02 NOTE — ED Provider Notes (Signed)
[facility] Emergency Department Provider Note  ____________________________________________   I have reviewed the triage vital signs and the nursing notes.   HISTORY  Chief Complaint Rectal Bleeding   History limited by: Not Limited   HPI [name] is a 57 y.o. male who presents to the emergency department today because of concern for black stools. This has been going on for the past three days. He denies similar symptoms in the past. He states that he does suffer from chronic abdominal pain. Has a follow up meeting with his PCP in two days. Has not seen a GI doctor recently. Denies any new pain. Denies any chest pain or shortness of breath. Denies any blood thinner use.    Per medical record review patient has a history of EGD done roughly one year ago that showed gastritis and esophagitis. Was seeing GI last year for chronic abdominal pain and dark/black stool.  Past Medical History:  Diagnosis Date  . Chronic kidney disease   . Diabetes mellitus without complication ([place])   . Hyperlipidemia   . Hypertension   . Stroke [facility])     Patient Active Problem List   Diagnosis Date Noted  . Carotid stenosis 08/02/2016  . GERD (gastroesophageal reflux disease) 08/02/2016  . History of esophagogastroduodenoscopy (EGD) 07/01/2016  . History of recent blood transfusion 07/01/2016  . Acute on chronic renal insufficiency 07/01/2016  . Hyponatremia 07/01/2016  . Diabetes ([place]) 07/01/2016  . Alcohol abuse 07/01/2016  . Monilial esophagitis ([place]) 07/01/2016  . Tobacco abuse 07/01/2016  . Confusion 06/29/2016  . Iron deficiency anemia 06/29/2016  . Coagulopathy ([place]) 06/29/2016    Past Surgical History:  Procedure Laterality Date  . ESOPHAGOGASTRODUODENOSCOPY (EGD) WITH PROPOFOL N/A 07/01/2016   Procedure: ESOPHAGOGASTRODUODENOSCOPY (EGD) WITH PROPOFOL;  Surgeon: San Jetty, MD;  Location: [facility] ENDOSCOPY;  Service: General;  Laterality: N/A;    Prior  to Admission medications   Medication Sig Start Date End Date Taking? Authorizing Provider  cephALEXin (KEFLEX) 500 MG capsule Take 1 capsule (500 mg total) by mouth 2 (two) times daily. 08/23/16   Carrie Mew, MD  docusate sodium (COLACE) 100 MG capsule Take 1 capsule (100 mg total) by mouth 2 (two) times daily as needed for mild constipation. 07/01/16   Theodoro Grist, MD  ferrous gluconate (FERGON) 324 MG tablet Take 1 tablet (324 mg total) by mouth 2 (two) times daily with a meal. 07/01/16   Theodoro Grist, MD  fluconazole (DIFLUCAN) 100 MG tablet Take 1 tablet (100 mg total) by mouth daily. 07/02/16   Theodoro Grist, MD  folic acid (FOLVITE) 1 MG tablet Take 1 tablet (1 mg total) by mouth daily. 07/02/16   Theodoro Grist, MD  Multiple Vitamin (MULTIVITAMIN WITH MINERALS) TABS tablet Take 1 tablet by mouth daily. 07/02/16   Theodoro Grist, MD  nicotine (NICODERM CQ - DOSED IN MG/24 HOURS) 14 mg/24hr patch Place 1 patch (14 mg total) onto the skin daily. 07/02/16   Theodoro Grist, MD  ondansetron (ZOFRAN ODT) 4 MG disintegrating tablet Take 1 tablet (4 mg total) by mouth every 8 (eight) hours as needed for nausea or vomiting. 08/23/16   Carrie Mew, MD  oxyCODONE-acetaminophen (ROXICET) 5-325 MG tablet Take 1 tablet by mouth every 6 (six) hours as needed for severe pain. 08/23/16   Carrie Mew, MD  pantoprazole (PROTONIX) 40 MG tablet Take 1 tablet (40 mg total) by mouth daily. 07/01/16   Theodoro Grist, MD  thiamine 100 MG tablet Take 1 tablet (100  mg total) by mouth daily. 07/02/16   Theodoro Grist, MD    Allergies Patient has no known allergies.  Family History  Problem Relation Age of Onset  . CAD Brother     Social History Social History   Tobacco Use  . Smoking status: Current Every Day Smoker    Packs/day: 0.25    Types: Cigars  . Smokeless tobacco: Never Used  Substance Use Topics  . Alcohol use: Yes    Alcohol/week: 6.0 oz    Types: 8 Cans of beer, 2 Shots of  liquor per week  . Drug use: No    Review of Systems Constitutional: No fever/chills Eyes: No visual changes. ENT: No sore throat. Cardiovascular: Denies chest pain. Respiratory: Denies shortness of breath. Gastrointestinal: Positive for chronic abdominal pain. Genitourinary: Negative for dysuria. Musculoskeletal: Negative for back pain. Skin: Negative for rash. Neurological: Negative for headaches, focal weakness or numbness.  ____________________________________________   PHYSICAL EXAM:  VITAL SIGNS: ED Triage Vitals [07/02/17 1316]  Enc Vitals Group     BP 119/87     Pulse Rate 98     Resp 18     Temp 98.3 F (36.8 C)     Temp Source Oral     SpO2 98 %     Weight 175 lb (79.4 kg)     Height 5\' 11"  (1.803 m)     Head Circumference      Peak Flow      Pain Score 9   Constitutional: Alert and oriented. Well appearing and in no distress. Eyes: Conjunctivae are normal.  ENT   Head: Normocephalic and atraumatic.   Nose: No congestion/rhinnorhea.   Mouth/Throat: Mucous membranes are moist.   Neck: No stridor. Hematological/Lymphatic/Immunilogical: No cervical lymphadenopathy. Cardiovascular: Normal rate, regular rhythm.  No murmurs, rubs, or gallops.  Respiratory: Normal respiratory effort without tachypnea nor retractions. Breath sounds are clear and equal bilaterally. No wheezes/rales/rhonchi. Gastrointestinal: Soft and non tender. No rebound. No guarding.  Rectal: No red blood on glove. No melena. Small amount of dark brown glove. Weakly positive GUIAC Musculoskeletal: Normal range of motion in all extremities. No lower extremity edema. Neurologic:  Normal speech and language. No gross focal neurologic deficits are appreciated.  Skin:  Skin is warm, dry and intact. No rash noted. Psychiatric: Mood and affect are normal. Speech and behavior are normal. Patient exhibits appropriate insight and judgment.  ____________________________________________     LABS (pertinent positives/negatives)  CBC wbc 4.3, hgb 13.5, plt 191 CMP na 134, glu 395, cr 1.63  ____________________________________________   EKG  None  ____________________________________________    RADIOLOGY  None  ____________________________________________   PROCEDURES  Procedures  ____________________________________________   INITIAL IMPRESSION / ASSESSMENT AND PLAN / ED COURSE  Pertinent labs & imaging results that were available during my care of the patient were reviewed by me and considered in my medical decision making (see chart for details).  Patient presented to the emergency department today because of concern for black stools. Patient vital signs and hgb within normal limits. Not on any blood thinners. Does have history of chronic abdominal pain and gastritis. Discussed work up with patient. Discussed importance of follow up with GI doctor. Will add on sucralfate to patient's medication. Has follow up already scheduled with PCP in two days. Will give patient diet information for GERD.   ____________________________________________   FINAL CLINICAL IMPRESSION(S) / ED DIAGNOSES  Final diagnoses:  Chronic abdominal pain  Black stool     Note: This  dictation was prepared with Dragon dictation. Any transcriptional errors that result from this process are unintentional     Nance Pear, MD 07/02/17 1652

## 2017-07-02 NOTE — ED Notes (Signed)
Pt wants carafate before he leaves. Per Dr. Archie Balboa, ok.

## 2017-07-02 NOTE — Discharge Instructions (Signed)
Please seek medical attention for any high fevers, chest pain, shortness of breath, change in behavior, persistent vomiting, bloody stool or any other new or concerning symptoms.  

## 2017-07-02 NOTE — ED Notes (Signed)
Pt ambulatory to wheel chair upon discharge. Verbalized understanding of discharge instructions, follow-up care and prescription. VSS. Skin warm and dry. A&O x4.

## 2017-07-02 NOTE — ED Notes (Addendum)
Pt c/o tender, aching abdominal pain for several days with no appetite x 1 week. Pt states his stool is black when he pushes hard to have a BM. Pt reports not being able to eat much for the past week because he has no appetite.  Pt denies pain right now but states pain is 10/10 when he strains to have BM.

## 2017-08-16 ENCOUNTER — Encounter: Payer: Self-pay | Admitting: Emergency Medicine

## 2017-08-16 ENCOUNTER — Inpatient Hospital Stay
Admission: EM | Admit: 2017-08-16 | Discharge: 2017-08-21 | DRG: 637 | Disposition: A | Discharge status: Home / Self Care | Payer: Medicare Other | Attending: Internal Medicine | Admitting: Internal Medicine

## 2017-08-16 ENCOUNTER — Emergency Department: Payer: Medicare Other

## 2017-08-16 DIAGNOSIS — K219 Gastro-esophageal reflux disease without esophagitis: Secondary | ICD-10-CM | POA: Diagnosis present

## 2017-08-16 DIAGNOSIS — N184 Chronic kidney disease, stage 4 (severe): Secondary | ICD-10-CM | POA: Diagnosis present

## 2017-08-16 DIAGNOSIS — E1122 Type 2 diabetes mellitus with diabetic chronic kidney disease: Secondary | ICD-10-CM | POA: Diagnosis present

## 2017-08-16 DIAGNOSIS — F17290 Nicotine dependence, other tobacco product, uncomplicated: Secondary | ICD-10-CM | POA: Diagnosis present

## 2017-08-16 DIAGNOSIS — Z9114 Patient's other noncompliance with medication regimen: Secondary | ICD-10-CM | POA: Diagnosis not present

## 2017-08-16 DIAGNOSIS — Z8673 Personal history of transient ischemic attack (TIA), and cerebral infarction without residual deficits: Secondary | ICD-10-CM

## 2017-08-16 DIAGNOSIS — K859 Acute pancreatitis without necrosis or infection, unspecified: Secondary | ICD-10-CM | POA: Diagnosis present

## 2017-08-16 DIAGNOSIS — Z681 Body mass index (BMI) 19 or less, adult: Secondary | ICD-10-CM | POA: Diagnosis not present

## 2017-08-16 DIAGNOSIS — K852 Alcohol induced acute pancreatitis without necrosis or infection: Secondary | ICD-10-CM | POA: Diagnosis present

## 2017-08-16 DIAGNOSIS — E1310 Other specified diabetes mellitus with ketoacidosis without coma: Secondary | ICD-10-CM

## 2017-08-16 DIAGNOSIS — I129 Hypertensive chronic kidney disease with stage 1 through stage 4 chronic kidney disease, or unspecified chronic kidney disease: Secondary | ICD-10-CM | POA: Diagnosis present

## 2017-08-16 DIAGNOSIS — Z8249 Family history of ischemic heart disease and other diseases of the circulatory system: Secondary | ICD-10-CM | POA: Diagnosis not present

## 2017-08-16 DIAGNOSIS — E785 Hyperlipidemia, unspecified: Secondary | ICD-10-CM | POA: Diagnosis present

## 2017-08-16 DIAGNOSIS — F1010 Alcohol abuse, uncomplicated: Secondary | ICD-10-CM | POA: Diagnosis present

## 2017-08-16 DIAGNOSIS — I10 Essential (primary) hypertension: Secondary | ICD-10-CM | POA: Diagnosis present

## 2017-08-16 DIAGNOSIS — N183 Chronic kidney disease, stage 3 (moderate): Secondary | ICD-10-CM | POA: Diagnosis present

## 2017-08-16 DIAGNOSIS — Z9119 Patient's noncompliance with other medical treatment and regimen: Secondary | ICD-10-CM | POA: Diagnosis not present

## 2017-08-16 DIAGNOSIS — E1110 Type 2 diabetes mellitus with ketoacidosis without coma: Principal | ICD-10-CM | POA: Diagnosis present

## 2017-08-16 DIAGNOSIS — E872 Acidosis: Secondary | ICD-10-CM | POA: Diagnosis present

## 2017-08-16 DIAGNOSIS — E43 Unspecified severe protein-calorie malnutrition: Secondary | ICD-10-CM | POA: Diagnosis present

## 2017-08-16 DIAGNOSIS — N179 Acute kidney failure, unspecified: Secondary | ICD-10-CM | POA: Diagnosis present

## 2017-08-16 HISTORY — DX: Alcohol abuse, uncomplicated: F10.10

## 2017-08-16 LAB — COMPREHENSIVE METABOLIC PANEL
ALT: 16 U/L — ABNORMAL LOW (ref 17–63)
AST: 16 U/L (ref 15–41)
Albumin: 3.7 g/dL (ref 3.5–5.0)
Alkaline Phosphatase: 106 U/L (ref 38–126)
Anion gap: 20 — ABNORMAL HIGH (ref 5–15)
BUN: 27 mg/dL — ABNORMAL HIGH (ref 6–20)
CO2: 14 mmol/L — ABNORMAL LOW (ref 22–32)
Calcium: 9.8 mg/dL (ref 8.9–10.3)
Chloride: 98 mmol/L — ABNORMAL LOW (ref 101–111)
Creatinine, Ser: 2.19 mg/dL — ABNORMAL HIGH (ref 0.61–1.24)
GFR calc Af Amer: 37 mL/min — ABNORMAL LOW (ref >60)
GFR calc non Af Amer: 32 mL/min — ABNORMAL LOW (ref >60)
Glucose, Bld: 646 mg/dL (ref 65–99)
Potassium: 5.7 mmol/L — ABNORMAL HIGH (ref 3.5–5.1)
Sodium: 132 mmol/L — ABNORMAL LOW (ref 135–145)
Total Bilirubin: 1.3 mg/dL — ABNORMAL HIGH (ref 0.3–1.2)
Total Protein: 8.4 g/dL — ABNORMAL HIGH (ref 6.5–8.1)

## 2017-08-16 LAB — CBC
HCT: 47.5 % (ref 40.0–52.0)
Hemoglobin: 15.6 g/dL (ref 13.0–18.0)
MCH: 32.8 pg (ref 26.0–34.0)
MCHC: 32.7 g/dL (ref 32.0–36.0)
MCV: 100.1 fL — ABNORMAL HIGH (ref 80.0–100.0)
Platelets: 160 10*3/uL (ref 150–440)
RBC: 4.75 MIL/uL (ref 4.40–5.90)
RDW: 12.6 % (ref 11.5–14.5)
WBC: 5.4 10*3/uL (ref 3.8–10.6)

## 2017-08-16 LAB — GLUCOSE, CAPILLARY: Glucose-Capillary: 552 mg/dL (ref 65–99)

## 2017-08-16 LAB — URINALYSIS, COMPLETE (UACMP) WITH MICROSCOPIC
Bacteria, UA: NONE SEEN
Bilirubin Urine: NEGATIVE
Glucose, UA: >=500 mg/dL — AB
Hgb urine dipstick: NEGATIVE
Ketones, ur: 80 mg/dL — AB
Leukocytes, UA: NEGATIVE
Nitrite: NEGATIVE
Protein, ur: 100 mg/dL — AB
Specific Gravity, Urine: 1.022 (ref 1.005–1.030)
pH: 5 (ref 5.0–8.0)

## 2017-08-16 LAB — LIPASE, BLOOD: Lipase: 192 U/L — ABNORMAL HIGH (ref 11–51)

## 2017-08-16 LAB — TROPONIN I: Troponin I: <0.03 ng/mL (ref <0.03)

## 2017-08-16 MED ORDER — INSULIN REGULAR HUMAN 100 UNIT/ML IJ SOLN
INTRAMUSCULAR | Status: DC
  Administered 2017-08-16: 4.9 [IU]/h via INTRAVENOUS
  Filled 2017-08-16: qty 1

## 2017-08-16 MED ORDER — LORAZEPAM 2 MG/ML IJ SOLN: 0.0000 mg | Freq: Two times a day (BID) | INTRAMUSCULAR | Status: DC

## 2017-08-16 MED ORDER — LORAZEPAM 1 MG PO TABS: 1.0000 mg | ORAL_TABLET | Freq: Four times a day (QID) | ORAL | Status: AC | PRN

## 2017-08-16 MED ORDER — LORAZEPAM 2 MG/ML IJ SOLN: 0.0000 mg | Freq: Four times a day (QID) | INTRAMUSCULAR | Status: DC

## 2017-08-16 MED ORDER — DEXTROSE-NACL 5-0.45 % IV SOLN: INTRAVENOUS | Status: DC

## 2017-08-16 MED ORDER — VITAMIN B-1 100 MG PO TABS: 100.0000 mg | ORAL_TABLET | Freq: Every day | ORAL | Status: DC

## 2017-08-16 MED ORDER — LORAZEPAM 2 MG/ML IJ SOLN: 0.0000 mg | Freq: Two times a day (BID) | INTRAMUSCULAR | Status: AC

## 2017-08-16 MED ORDER — SODIUM CHLORIDE 0.9 % IV SOLN: INTRAVENOUS | Status: DC

## 2017-08-16 MED ORDER — SODIUM CHLORIDE 0.9 % IV BOLUS: 1000.0000 mL | Freq: Once | INTRAVENOUS | Status: DC

## 2017-08-16 MED ORDER — THIAMINE HCL 100 MG/ML IJ SOLN
100.0000 mg | Freq: Every day | INTRAMUSCULAR | Status: DC
  Administered 2017-08-16: 100 mg via INTRAVENOUS
  Filled 2017-08-16: qty 2

## 2017-08-16 MED ORDER — LORAZEPAM 2 MG/ML IJ SOLN: 1.0000 mg | Freq: Four times a day (QID) | INTRAMUSCULAR | Status: AC | PRN

## 2017-08-16 MED ORDER — SODIUM CHLORIDE 0.9 % IV SOLN
INTRAVENOUS | Status: DC
  Administered 2017-08-17: 01:00:00 via INTRAVENOUS

## 2017-08-16 MED ORDER — FOLIC ACID 1 MG PO TABS
1.0000 mg | ORAL_TABLET | Freq: Every day | ORAL | Status: DC
  Administered 2017-08-17 – 2017-08-21 (×5): 1 mg via ORAL
  Filled 2017-08-16 (×5): qty 1

## 2017-08-16 MED ORDER — SODIUM CHLORIDE 0.9 % IV SOLN: INTRAVENOUS | Status: AC

## 2017-08-16 MED ORDER — PANTOPRAZOLE SODIUM 40 MG PO TBEC
40.0000 mg | DELAYED_RELEASE_TABLET | Freq: Every day | ORAL | Status: DC
  Administered 2017-08-17 – 2017-08-21 (×5): 40 mg via ORAL
  Filled 2017-08-16 (×5): qty 1

## 2017-08-16 MED ORDER — DEXTROSE-NACL 5-0.45 % IV SOLN
INTRAVENOUS | Status: DC
  Administered 2017-08-17: 04:00:00 via INTRAVENOUS

## 2017-08-16 MED ORDER — ADULT MULTIVITAMIN W/MINERALS CH
1.0000 | ORAL_TABLET | Freq: Every day | ORAL | Status: DC
  Administered 2017-08-17 – 2017-08-21 (×5): 1 via ORAL
  Filled 2017-08-16 (×5): qty 1

## 2017-08-16 MED ORDER — VITAMIN B-1 100 MG PO TABS
100.0000 mg | ORAL_TABLET | Freq: Every day | ORAL | Status: DC
  Administered 2017-08-17 – 2017-08-20 (×4): 100 mg via ORAL
  Filled 2017-08-16 (×4): qty 1

## 2017-08-16 MED ORDER — LORAZEPAM 2 MG PO TABS: 0.0000 mg | ORAL_TABLET | Freq: Four times a day (QID) | ORAL | Status: DC

## 2017-08-16 MED ORDER — LORAZEPAM 2 MG PO TABS: 0.0000 mg | ORAL_TABLET | Freq: Two times a day (BID) | ORAL | Status: DC

## 2017-08-16 MED ORDER — SODIUM CHLORIDE 0.9 % IV BOLUS
1000.0000 mL | Freq: Once | INTRAVENOUS | Status: AC
Start: 2017-08-16 — End: 2017-08-16
  Administered 2017-08-16: 1000 mL via INTRAVENOUS

## 2017-08-16 MED ORDER — LORAZEPAM 2 MG/ML IJ SOLN: 0.0000 mg | Freq: Four times a day (QID) | INTRAMUSCULAR | Status: AC

## 2017-08-16 MED ORDER — HEPARIN SODIUM (PORCINE) 5000 UNIT/ML IJ SOLN
5000.0000 [IU] | Freq: Three times a day (TID) | INTRAMUSCULAR | Status: DC
  Administered 2017-08-17 – 2017-08-21 (×11): 5000 [IU] via SUBCUTANEOUS
  Filled 2017-08-16 (×11): qty 1

## 2017-08-16 MED ORDER — THIAMINE HCL 100 MG/ML IJ SOLN
100.0000 mg | Freq: Every day | INTRAMUSCULAR | Status: DC
  Administered 2017-08-21: 100 mg via INTRAVENOUS
  Filled 2017-08-16: qty 2

## 2017-08-16 NOTE — ED Notes (Signed)
Date and time results received: 08/16/17 2140  Test: Glucose Critical Value: 646  Name of Provider Notified: Dr. Clearnce Hasten  Orders Received? Or Actions Taken?: No new orders at this time. Will continue to monitor.

## 2017-08-16 NOTE — H&P (Signed)
[name] at [place] NAME: [name]    MR#:  [id number]  DATE OF BIRTH:  [birth year]  DATE OF ADMISSION:  08/16/2017  PRIMARY CARE PHYSICIAN: [name], MD   REQUESTING/REFERRING PHYSICIAN: Clearnce Hasten, MD  CHIEF COMPLAINT:   Chief Complaint  Patient presents with  . Weakness  . Rapid Weight Loss    HISTORY OF PRESENT ILLNESS:  [name]  is a 57 y.o. male who presents with domino pain for the last several days.  Patient states he is had abdominal pain starting from more than a year ago, though he states he is never been diagnosed with pancreatitis.  He is a poor historian.  He states his pain started at this time around 3-4 days ago.  He states he has not had very much to eat or drink during that time.  Here he was found to have a significantly elevated lipase consistent with pancreatitis.  He is also found to have significantly elevated blood glucose level and elevated anion gap, consistent with DKA.  Hospitalist were called for admission  PAST MEDICAL HISTORY:   Past Medical History:  Diagnosis Date  . Chronic kidney disease   . Diabetes mellitus without complication ([place])   . Hyperlipidemia   . Hypertension   . Stroke [facility])      PAST SURGICAL HISTORY:   Past Surgical History:  Procedure Laterality Date  . ESOPHAGOGASTRODUODENOSCOPY (EGD) WITH PROPOFOL N/A 07/01/2016   Procedure: ESOPHAGOGASTRODUODENOSCOPY (EGD) WITH PROPOFOL;  Surgeon: San Jetty, MD;  Location: [facility] ENDOSCOPY;  Service: General;  Laterality: N/A;     SOCIAL HISTORY:   Social History   Tobacco Use  . Smoking status: Current Every Day Smoker    Packs/day: 0.25    Types: Cigars  . Smokeless tobacco: Never Used  Substance Use Topics  . Alcohol use: Yes    Alcohol/week: 6.0 oz    Types: 8 Cans of beer, 2 Shots of liquor per week     FAMILY HISTORY:   Family History  Problem Relation Age of Onset  . CAD Brother      DRUG  ALLERGIES:  No Known Allergies  MEDICATIONS AT HOME:   Prior to Admission medications   Medication Sig Start Date End Date Taking? Authorizing Provider  cephALEXin (KEFLEX) 500 MG capsule Take 1 capsule (500 mg total) by mouth 2 (two) times daily. 08/23/16   Carrie Mew, MD  docusate sodium (COLACE) 100 MG capsule Take 1 capsule (100 mg total) by mouth 2 (two) times daily as needed for mild constipation. 07/01/16   Theodoro Grist, MD  ferrous gluconate (FERGON) 324 MG tablet Take 1 tablet (324 mg total) by mouth 2 (two) times daily with a meal. 07/01/16   Theodoro Grist, MD  fluconazole (DIFLUCAN) 100 MG tablet Take 1 tablet (100 mg total) by mouth daily. 07/02/16   Theodoro Grist, MD  folic acid (FOLVITE) 1 MG tablet Take 1 tablet (1 mg total) by mouth daily. 07/02/16   Theodoro Grist, MD  Multiple Vitamin (MULTIVITAMIN WITH MINERALS) TABS tablet Take 1 tablet by mouth daily. 07/02/16   Theodoro Grist, MD  nicotine (NICODERM CQ - DOSED IN MG/24 HOURS) 14 mg/24hr patch Place 1 patch (14 mg total) onto the skin daily. 07/02/16   Theodoro Grist, MD  ondansetron (ZOFRAN ODT) 4 MG disintegrating tablet Take 1 tablet (4 mg total) by mouth every 8 (eight) hours as needed for nausea or vomiting. 08/23/16   Carrie Mew, MD  oxyCODONE-acetaminophen (ROXICET) 5-325 MG tablet Take 1 tablet by mouth every 6 (six) hours as needed for severe pain. 08/23/16   Carrie Mew, MD  pantoprazole (PROTONIX) 40 MG tablet Take 1 tablet (40 mg total) by mouth daily. 07/01/16   Theodoro Grist, MD  sucralfate (CARAFATE) 1 g tablet Take 1 tablet (1 g total) by mouth 4 (four) times daily. 07/02/17   Nance Pear, MD  thiamine 100 MG tablet Take 1 tablet (100 mg total) by mouth daily. 07/02/16   Theodoro Grist, MD    REVIEW OF SYSTEMS:  Review of Systems  Constitutional: Negative for chills, fever, malaise/fatigue and weight loss.  HENT: Negative for ear pain, hearing loss and tinnitus.   Eyes: Negative for  blurred vision, double vision, pain and redness.  Respiratory: Negative for cough, hemoptysis and shortness of breath.   Cardiovascular: Negative for chest pain, palpitations, orthopnea and leg swelling.  Gastrointestinal: Positive for abdominal pain and nausea. Negative for constipation, diarrhea and vomiting.  Genitourinary: Negative for dysuria, frequency and hematuria.  Musculoskeletal: Negative for back pain, joint pain and neck pain.  Skin:       No acne, rash, or lesions  Neurological: Negative for dizziness, tremors, focal weakness and weakness.  Endo/Heme/Allergies: Negative for polydipsia. Does not bruise/bleed easily.  Psychiatric/Behavioral: Negative for depression. The patient is not nervous/anxious and does not have insomnia.      VITAL SIGNS:   Vitals:   08/16/17 1920 08/16/17 1925 08/16/17 2137  BP:  120/85 (!) 145/98  Pulse:  (!) 106 80  Resp:  16   Temp:  97.9 F (36.6 C)   TempSrc:  Oral   Weight: 56.2 kg (123 lb 14.4 oz)     Wt Readings from Last 3 Encounters:  08/16/17 56.2 kg (123 lb 14.4 oz)  07/02/17 79.4 kg (175 lb)  08/23/16 79.4 kg (175 lb)    PHYSICAL EXAMINATION:  Physical Exam  Vitals reviewed. Constitutional: He is oriented to person, place, and time. He appears well-developed and well-nourished. No distress.  HENT:  Head: Normocephalic and atraumatic.  Dry mucous membranes  Eyes: Pupils are equal, round, and reactive to light. Conjunctivae and EOM are normal. No scleral icterus.  Neck: Normal range of motion. Neck supple. No JVD present. No thyromegaly present.  Cardiovascular: Regular rhythm and intact distal pulses. Exam reveals no gallop and no friction rub.  No murmur heard. Tachycardic  Respiratory: Effort normal and breath sounds normal. No respiratory distress. He has no wheezes. He has no rales.  GI: Soft. Bowel sounds are normal. He exhibits no distension. There is tenderness.  Musculoskeletal: Normal range of motion. He exhibits  no edema.  No arthritis, no gout  Lymphadenopathy:    He has no cervical adenopathy.  Neurological: He is alert and oriented to person, place, and time. No cranial nerve deficit.  No dysarthria, no aphasia  Skin: Skin is warm and dry. No rash noted. No erythema.  Psychiatric: He has a normal mood and affect. His behavior is normal. Judgment and thought content normal.    LABORATORY PANEL:   CBC Recent Labs  Lab 08/16/17 2011  WBC 5.4  HGB 15.6  HCT 47.5  PLT 160   ------------------------------------------------------------------------------------------------------------------  Chemistries  Recent Labs  Lab 08/16/17 2044  NA 132*  K 5.7*  CL 98*  CO2 14*  GLUCOSE 646*  BUN 27*  CREATININE 2.19*  CALCIUM 9.8  AST 16  ALT 16*  ALKPHOS 106  BILITOT 1.3*   ------------------------------------------------------------------------------------------------------------------  Cardiac Enzymes Recent Labs  Lab 08/16/17 2044  TROPONINI <0.03   ------------------------------------------------------------------------------------------------------------------  RADIOLOGY:  Ct Abdomen Pelvis Wo Contrast  Result Date: 08/16/2017 CLINICAL DATA:  Patient states he has abdominal pains that feel like pressure in nature. He states he has lost 50# in 2 weeks and taken weight tonight reflects that. Pt denies n/v/d. EXAM: CT ABDOMEN AND PELVIS WITHOUT CONTRAST TECHNIQUE: Multidetector CT imaging of the abdomen and pelvis was performed following the standard protocol without IV contrast. COMPARISON:  10/15/2016 FINDINGS: Lower chest: Clear lung bases.  Heart normal size. Hepatobiliary: No focal liver abnormality is seen. No gallstones, gallbladder wall thickening, or biliary dilatation. Pancreas: There is hazy opacity blurring the contour of the pancreas most evident along the pancreatic head, neck, body and proximal tail. There is subtle adjacent inflammatory type stranding. These findings  are new from the prior CT. No pancreatic masses. No formed fluid collection to suggest an abscess or pseudocyst. Spleen: Normal in size without focal abnormality. Adrenals/Urinary Tract: 9 mm low-density mass arises from the lateral limb of the left adrenal gland consistent with an adenoma or nodular hyperplasia. Adrenals otherwise unremarkable. Kidneys normal size, orientation and position. No renal masses, stones or hydronephrosis. Normal ureters. Normal bladder. Stomach/Bowel: No bowel dilation to suggest obstruction. There is no bowel wall thickening or adjacent inflammation. Stomach is unremarkable. Normal appendix visualized. Vascular/Lymphatic: Dense aortic atherosclerotic calcifications extending into the iliac vessels. No adenopathy. Reproductive: Unremarkable. Other: No abdominal wall hernia.  No ascites. Musculoskeletal: No fracture or acute finding. No osteoblastic or osteolytic lesions. IMPRESSION: 1. Mild pancreatitis. 2. No other acute abnormality. 3. Aortic atherosclerosis. Electronically Signed   By: Lajean Manes M.D.   On: 08/16/2017 21:03    EKG:   Orders placed or performed during the hospital encounter of 08/16/17  . EKG 12-Lead  . EKG 12-Lead  . ED EKG  . ED EKG    IMPRESSION AND PLAN:  Principal Problem:   DKA (diabetic ketoacidoses) ([place]) -admit per DKA admission order set with IV insulin and corresponding appropriate labs and fluids Active Problems:   Acute renal failure superimposed on stage 3 chronic kidney disease (HCC) -IV fluids as above, avoid nephrotoxins and monitor for expected improvement   Pancreatitis, acute -aggressive IV fluids, PRN analgesia, n.p.o. for now   Alcohol abuse -CIWA protocol   HTN (hypertension) -continue home meds   GERD (gastroesophageal reflux disease) -home dose PPI   HLD (hyperlipidemia) -home dose antilipid  Chart review performed and case discussed with ED provider. Labs, imaging and/or ECG reviewed by provider and discussed with  patient/family. Management plans discussed with the patient and/or family.  DVT PROPHYLAXIS: SubQ lovenox  GI PROPHYLAXIS: PPI  ADMISSION STATUS: Inpatient  CODE STATUS: Full Code Status History    Date Active Date Inactive Code Status Order ID Comments User Context   06/29/2016 2124 07/01/2016 1805 Full Code [id number]  Vaughan Basta, MD Inpatient      TOTAL CRITICAL CARE TIME TAKING CARE OF THIS PATIENT: 50 minutes.   Ashley Montminy [place] 08/16/2017, 10:14 PM  [employer]  [phone]  CC: Primary care physician; [name], MD  Note:  This document was prepared using Dragon voice recognition software and may include unintentional dictation errors.

## 2017-08-16 NOTE — ED Triage Notes (Signed)
Patient states he has abdominal pains that feel like pressure in nature.  He states he has lost 50# in 2 weeks and taken weight tonight reflects that.  Pt denies n/v/d and "does not know where the weight went".  Pt states he is weak and it is starting to affect his walking and ADL's.

## 2017-08-16 NOTE — ED Notes (Signed)
Patient transported to CT 

## 2017-08-16 NOTE — ED Triage Notes (Signed)
FIRST NURSE NOTE-reports lost 50 lb in last week.  Also has abdominal pain.

## 2017-08-16 NOTE — ED Provider Notes (Addendum)
[facility] Emergency Department Provider Note  ___________________________________________   First MD Initiated Contact with Patient 08/16/17 1948     (approximate)  I have reviewed the triage vital signs and the nursing notes.   HISTORY  Chief Complaint Weakness and Rapid Weight Loss   HPI [name] is a 57 y.o. male with a history of chronic kidney disease as well as diabetes and hypertension was presenting to the emergency department today with 50 pound weight loss over 2 weeks.  He says that he also has a constant and cramping upper abdominal pain which is mild to moderate.  Denies any radiation into his chest.  Says that he has been able to eat and drink and is eating a half a sandwich today.  Denies any black or bloody stools.   Past Medical History:  Diagnosis Date  . Chronic kidney disease   . Diabetes mellitus without complication ([place])   . Hyperlipidemia   . Hypertension   . Stroke [facility])     Patient Active Problem List   Diagnosis Date Noted  . Carotid stenosis 08/02/2016  . GERD (gastroesophageal reflux disease) 08/02/2016  . History of esophagogastroduodenoscopy (EGD) 07/01/2016  . History of recent blood transfusion 07/01/2016  . Acute on chronic renal insufficiency 07/01/2016  . Hyponatremia 07/01/2016  . Diabetes ([place]) 07/01/2016  . Alcohol abuse 07/01/2016  . Monilial esophagitis ([place]) 07/01/2016  . Tobacco abuse 07/01/2016  . Confusion 06/29/2016  . Iron deficiency anemia 06/29/2016  . Coagulopathy ([place]) 06/29/2016    Past Surgical History:  Procedure Laterality Date  . ESOPHAGOGASTRODUODENOSCOPY (EGD) WITH PROPOFOL N/A 07/01/2016   Procedure: ESOPHAGOGASTRODUODENOSCOPY (EGD) WITH PROPOFOL;  Surgeon: San Jetty, MD;  Location: [facility] ENDOSCOPY;  Service: General;  Laterality: N/A;    Prior to Admission medications   Medication Sig Start Date End Date Taking? Authorizing Provider  cephALEXin (KEFLEX) 500 MG capsule  Take 1 capsule (500 mg total) by mouth 2 (two) times daily. 08/23/16   Carrie Mew, MD  docusate sodium (COLACE) 100 MG capsule Take 1 capsule (100 mg total) by mouth 2 (two) times daily as needed for mild constipation. 07/01/16   Theodoro Grist, MD  ferrous gluconate (FERGON) 324 MG tablet Take 1 tablet (324 mg total) by mouth 2 (two) times daily with a meal. 07/01/16   Theodoro Grist, MD  fluconazole (DIFLUCAN) 100 MG tablet Take 1 tablet (100 mg total) by mouth daily. 07/02/16   Theodoro Grist, MD  folic acid (FOLVITE) 1 MG tablet Take 1 tablet (1 mg total) by mouth daily. 07/02/16   Theodoro Grist, MD  Multiple Vitamin (MULTIVITAMIN WITH MINERALS) TABS tablet Take 1 tablet by mouth daily. 07/02/16   Theodoro Grist, MD  nicotine (NICODERM CQ - DOSED IN MG/24 HOURS) 14 mg/24hr patch Place 1 patch (14 mg total) onto the skin daily. 07/02/16   Theodoro Grist, MD  ondansetron (ZOFRAN ODT) 4 MG disintegrating tablet Take 1 tablet (4 mg total) by mouth every 8 (eight) hours as needed for nausea or vomiting. 08/23/16   Carrie Mew, MD  oxyCODONE-acetaminophen (ROXICET) 5-325 MG tablet Take 1 tablet by mouth every 6 (six) hours as needed for severe pain. 08/23/16   Carrie Mew, MD  pantoprazole (PROTONIX) 40 MG tablet Take 1 tablet (40 mg total) by mouth daily. 07/01/16   Theodoro Grist, MD  sucralfate (CARAFATE) 1 g tablet Take 1 tablet (1 g total) by mouth 4 (four) times daily. 07/02/17   Nance Pear, MD  thiamine 100  MG tablet Take 1 tablet (100 mg total) by mouth daily. 07/02/16   Theodoro Grist, MD    Allergies Patient has no known allergies.  Family History  Problem Relation Age of Onset  . CAD Brother     Social History Social History   Tobacco Use  . Smoking status: Current Every Day Smoker    Packs/day: 0.25    Types: Cigars  . Smokeless tobacco: Never Used  Substance Use Topics  . Alcohol use: Yes    Alcohol/week: 6.0 oz    Types: 8 Cans of beer, 2 Shots of liquor per  week  . Drug use: No    Review of Systems  Constitutional: No fever/chills Eyes: No visual changes. ENT: No sore throat. Cardiovascular: Denies chest pain. Respiratory: Denies shortness of breath. Gastrointestinal:  No nausea, no vomiting.  No diarrhea.  No constipation. Genitourinary: Negative for dysuria. Musculoskeletal: Negative for back pain. Skin: Negative for rash. Neurological: Negative for headaches, focal weakness or numbness.   ____________________________________________   PHYSICAL EXAM:  VITAL SIGNS: ED Triage Vitals  Enc Vitals Group     BP 08/16/17 1925 120/85     Pulse Rate 08/16/17 1925 (!) 106     Resp 08/16/17 1925 16     Temp 08/16/17 1925 97.9 F (36.6 C)     Temp Source 08/16/17 1925 Oral     SpO2 --      Weight 08/16/17 1920 123 lb 14.4 oz (56.2 kg)     Height --      Head Circumference --      Peak Flow --      Pain Score 08/16/17 1930 9     Pain Loc --      Pain Edu? --      Excl. in [place]? --     Constitutional: Alert and oriented. Well appearing and in no acute distress. Eyes: Conjunctivae are normal.  Head: Atraumatic. Nose: No congestion/rhinnorhea. Mouth/Throat: Mucous membranes are moist.  Neck: No stridor.   Cardiovascular: Normal rate, regular rhythm. Grossly normal heart sounds.   Respiratory: Normal respiratory effort.  No retractions. Lungs CTAB. Gastrointestinal: Soft with moderate tenderness to palpation across the upper abdomen without any rebound or guarding.  Negative Murphy sign. No distention.  Musculoskeletal: No lower extremity tenderness nor edema.  No joint effusions. Neurologic:  Normal speech and language. No gross focal neurologic deficits are appreciated. Skin:  Skin is warm, dry and intact. No rash noted. Psychiatric: Mood and affect are normal. Speech and behavior are normal.  ____________________________________________   LABS (all labs ordered are listed, but only abnormal results are displayed)  Labs  Reviewed  CBC - Abnormal; Notable for the following components:      Result Value   MCV 100.1 (*)    All other components within normal limits  URINALYSIS, COMPLETE (UACMP) WITH MICROSCOPIC - Abnormal; Notable for the following components:   Color, Urine YELLOW (*)    APPearance CLEAR (*)    Glucose, UA >=500 (*)    Ketones, ur 80 (*)    Protein, ur 100 (*)    Squamous Epithelial / LPF 0-5 (*)    All other components within normal limits  COMPREHENSIVE METABOLIC PANEL - Abnormal; Notable for the following components:   Sodium 132 (*)    Potassium 5.7 (*)    Chloride 98 (*)    CO2 14 (*)    BUN 27 (*)    Creatinine, Ser 2.19 (*)    Total Protein  8.4 (*)    ALT 16 (*)    Total Bilirubin 1.3 (*)    GFR calc non Af Amer 32 (*)    GFR calc Af Amer 37 (*)    Anion gap 20 (*)    All other components within normal limits  LIPASE, BLOOD - Abnormal; Notable for the following components:   Lipase 192 (*)    All other components within normal limits  TROPONIN I  CBG MONITORING, ED   ____________________________________________  EKG  ED ECG REPORT I, Doran Stabler, the attending physician, personally viewed and interpreted this ECG.   Date: 08/16/2017  EKG Time: 1941  Rate: 85  Rhythm: normal sinus rhythm  Axis: normal  Intervals:none  ST&T Change: No ST segment elevation or depression.  No abnormal T wave inversion.  ____________________________________________  RADIOLOGY  CAT scan with mild pancreatitis. ____________________________________________   PROCEDURES  Procedure(s) performed:   .Critical Care Performed by: Orbie Pyo, MD Authorized by: Orbie Pyo, MD   Critical care provider statement:    Critical care time (minutes):  35   Critical care was necessary to treat or prevent imminent or life-threatening deterioration of the following conditions:  Endocrine crisis   Critical care was time spent personally by me on the  following activities:  Examination of patient, ordering and performing treatments and interventions, ordering and review of laboratory studies, ordering and review of radiographic studies and re-evaluation of patient's condition    Critical Care performed:   ____________________________________________   INITIAL IMPRESSION / ASSESSMENT AND PLAN / ED COURSE  Pertinent labs & imaging results that were available during my care of the patient were reviewed by me and considered in my medical decision making (see chart for details).  Differential diagnosis includes, but is not limited to, biliary disease (biliary colic, acute cholecystitis, cholangitis, choledocholithiasis, etc), intrathoracic causes for epigastric abdominal pain including ACS, gastritis, duodenitis, pancreatitis, small bowel or large bowel obstruction, abdominal aortic aneurysm, hernia, and gastritis. As part of my medical decision making, I reviewed the following data within the electronic MEDICAL RECORD NUMBER Notes from prior ED visits  ----------------------------------------- 9:34 PM on 08/16/2017 -----------------------------------------  Patient at this time resting and without any distress.  Found to have pancreatitis with also renal failure.  Now says that he is eating and drinking less over the past 2 weeks.  However, he says that he does drink both 2 beers as well as 2 drinks of hard liquor per day.  Says that his last drink was earlier today.  Patient will be admitted to the hospital.  Signed out to Dr. Jannifer Franklin.  ----------------------------------------- 9:42 PM on 08/16/2017 -----------------------------------------  Critical glucose of greater than 600.  Patient in DKA.  We will start an insulin drip.  Patient updated.  ____________________________________________   FINAL CLINICAL IMPRESSION(S) / ED DIAGNOSES  Pancreatitis.  Acute renal failure.  Diabetic ketoacidosis.    NEW MEDICATIONS STARTED DURING THIS  VISIT:  New Prescriptions   No medications on file     Note:  This document was prepared using Dragon voice recognition software and may include unintentional dictation errors.     Orbie Pyo, MD 08/16/17 2135    Orbie Pyo, MD 08/16/17 [phone]

## 2017-08-17 ENCOUNTER — Encounter: Payer: Self-pay | Admitting: Adult Health

## 2017-08-17 ENCOUNTER — Other Ambulatory Visit: Payer: Self-pay

## 2017-08-17 DIAGNOSIS — E1110 Type 2 diabetes mellitus with ketoacidosis without coma: Principal | ICD-10-CM

## 2017-08-17 LAB — CBC
HCT: 41.5 % (ref 40.0–52.0)
Hemoglobin: 13.8 g/dL (ref 13.0–18.0)
MCH: 32.5 pg (ref 26.0–34.0)
MCHC: 33.2 g/dL (ref 32.0–36.0)
MCV: 98 fL (ref 80.0–100.0)
Platelets: 170 10*3/uL (ref 150–440)
RBC: 4.24 MIL/uL — ABNORMAL LOW (ref 4.40–5.90)
RDW: 12.3 % (ref 11.5–14.5)
WBC: 5.7 10*3/uL (ref 3.8–10.6)

## 2017-08-17 LAB — BASIC METABOLIC PANEL
Anion gap: 13 (ref 5–15)
Anion gap: 14 (ref 5–15)
Anion gap: 8 (ref 5–15)
BUN: 28 mg/dL — ABNORMAL HIGH (ref 6–20)
BUN: 27 mg/dL — ABNORMAL HIGH (ref 6–20)
BUN: 28 mg/dL — ABNORMAL HIGH (ref 6–20)
CO2: 17 mmol/L — ABNORMAL LOW (ref 22–32)
CO2: 21 mmol/L — ABNORMAL LOW (ref 22–32)
CO2: 21 mmol/L — ABNORMAL LOW (ref 22–32)
Calcium: 9 mg/dL (ref 8.9–10.3)
Calcium: 10.4 mg/dL — ABNORMAL HIGH (ref 8.9–10.3)
Calcium: 8.9 mg/dL (ref 8.9–10.3)
Chloride: 106 mmol/L (ref 101–111)
Chloride: 112 mmol/L — ABNORMAL HIGH (ref 101–111)
Chloride: 108 mmol/L (ref 101–111)
Creatinine, Ser: 1.85 mg/dL — ABNORMAL HIGH (ref 0.61–1.24)
Creatinine, Ser: 1.87 mg/dL — ABNORMAL HIGH (ref 0.61–1.24)
Creatinine, Ser: 1.67 mg/dL — ABNORMAL HIGH (ref 0.61–1.24)
GFR calc Af Amer: 45 mL/min — ABNORMAL LOW (ref >60)
GFR calc Af Amer: 44 mL/min — ABNORMAL LOW (ref >60)
GFR calc Af Amer: 51 mL/min — ABNORMAL LOW (ref >60)
GFR calc non Af Amer: 39 mL/min — ABNORMAL LOW (ref >60)
GFR calc non Af Amer: 38 mL/min — ABNORMAL LOW (ref >60)
GFR calc non Af Amer: 44 mL/min — ABNORMAL LOW (ref >60)
Glucose, Bld: 419 mg/dL — ABNORMAL HIGH (ref 65–99)
Glucose, Bld: 75 mg/dL (ref 65–99)
Glucose, Bld: 182 mg/dL — ABNORMAL HIGH (ref 65–99)
Potassium: 3.8 mmol/L (ref 3.5–5.1)
Potassium: 5 mmol/L (ref 3.5–5.1)
Potassium: 3.8 mmol/L (ref 3.5–5.1)
Sodium: 136 mmol/L (ref 135–145)
Sodium: 147 mmol/L — ABNORMAL HIGH (ref 135–145)
Sodium: 137 mmol/L (ref 135–145)

## 2017-08-17 LAB — LIPID PANEL
Cholesterol: 172 mg/dL (ref 0–200)
HDL: 47 mg/dL (ref >40)
LDL Cholesterol: 104 mg/dL — ABNORMAL HIGH (ref 0–99)
Total CHOL/HDL Ratio: 3.7 RATIO
Triglycerides: 107 mg/dL (ref <150)
VLDL: 21 mg/dL (ref 0–40)

## 2017-08-17 LAB — GLUCOSE, CAPILLARY
Glucose-Capillary: 110 mg/dL — ABNORMAL HIGH (ref 65–99)
Glucose-Capillary: 142 mg/dL — ABNORMAL HIGH (ref 65–99)
Glucose-Capillary: 163 mg/dL — ABNORMAL HIGH (ref 65–99)
Glucose-Capillary: 163 mg/dL — ABNORMAL HIGH (ref 65–99)
Glucose-Capillary: 173 mg/dL — ABNORMAL HIGH (ref 65–99)
Glucose-Capillary: 194 mg/dL — ABNORMAL HIGH (ref 65–99)
Glucose-Capillary: 214 mg/dL — ABNORMAL HIGH (ref 65–99)
Glucose-Capillary: 260 mg/dL — ABNORMAL HIGH (ref 65–99)
Glucose-Capillary: 346 mg/dL — ABNORMAL HIGH (ref 65–99)
Glucose-Capillary: 364 mg/dL — ABNORMAL HIGH (ref 65–99)
Glucose-Capillary: 417 mg/dL — ABNORMAL HIGH (ref 65–99)
Glucose-Capillary: 528 mg/dL (ref 65–99)
Glucose-Capillary: 54 mg/dL — ABNORMAL LOW (ref 65–99)
Glucose-Capillary: 85 mg/dL (ref 65–99)
Glucose-Capillary: 89 mg/dL (ref 65–99)
Glucose-Capillary: >600 mg/dL (ref 65–99)

## 2017-08-17 LAB — MAGNESIUM: Magnesium: 2.1 mg/dL (ref 1.7–2.4)

## 2017-08-17 LAB — MRSA PCR SCREENING: MRSA by PCR: NEGATIVE

## 2017-08-17 LAB — HEMOGLOBIN A1C
Hgb A1c MFr Bld: 12.4 % — ABNORMAL HIGH (ref 4.8–5.6)
Mean Plasma Glucose: 309.18 mg/dL

## 2017-08-17 LAB — LIPASE, BLOOD: Lipase: 146 U/L — ABNORMAL HIGH (ref 11–51)

## 2017-08-17 LAB — PHOSPHORUS: Phosphorus: 3.3 mg/dL (ref 2.5–4.6)

## 2017-08-17 MED ORDER — INSULIN GLARGINE 100 UNIT/ML ~~LOC~~ SOLN
10.0000 [IU] | SUBCUTANEOUS | Status: DC
  Administered 2017-08-17: 10 [IU] via SUBCUTANEOUS
  Filled 2017-08-17 (×2): qty 0.1

## 2017-08-17 MED ORDER — HYDROMORPHONE HCL 1 MG/ML IJ SOLN
0.5000 mg | INTRAMUSCULAR | Status: DC | PRN
  Administered 2017-08-17: 0.5 mg via INTRAVENOUS
  Filled 2017-08-17: qty 1

## 2017-08-17 MED ORDER — INSULIN ASPART 100 UNIT/ML ~~LOC~~ SOLN
0.0000 [IU] | Freq: Every day | SUBCUTANEOUS | Status: DC
  Administered 2017-08-17: 4 [IU] via SUBCUTANEOUS
  Administered 2017-08-18: 2 [IU] via SUBCUTANEOUS
  Administered 2017-08-20: 3 [IU] via SUBCUTANEOUS
  Filled 2017-08-17 (×3): qty 1

## 2017-08-17 MED ORDER — INSULIN ASPART 100 UNIT/ML ~~LOC~~ SOLN
0.0000 [IU] | Freq: Three times a day (TID) | SUBCUTANEOUS | Status: DC
  Administered 2017-08-17: 5 [IU] via SUBCUTANEOUS
  Administered 2017-08-19: 20 [IU] via SUBCUTANEOUS
  Administered 2017-08-19 – 2017-08-20 (×2): 2 [IU] via SUBCUTANEOUS
  Administered 2017-08-20: 5 [IU] via SUBCUTANEOUS
  Administered 2017-08-21: 2 [IU] via SUBCUTANEOUS
  Filled 2017-08-17 (×6): qty 1

## 2017-08-17 MED ORDER — ENSURE ENLIVE PO LIQD
237.0000 mL | Freq: Three times a day (TID) | ORAL | Status: DC
  Administered 2017-08-17: 237 mL via ORAL

## 2017-08-17 NOTE — ED Notes (Signed)
Insulin rate changed to 9.4units/hr verified by this nurse and Sharyn Lull, RN.

## 2017-08-17 NOTE — Progress Notes (Signed)
[place] at [facility]                                                                                                                                                                                  Patient Demographics   [name], is a 57 y.o. male, DOB - Jun 19, 1960, JKK:[id number]  Admit date - 08/16/2017   Admitting Physician Lance Coon, MD  Outpatient Primary MD for the patient is [name], MD   LOS - 1  Subjective: Patient admitted with DKA continues to complain of abdominal pain    Review of Systems:   CONSTITUTIONAL: No documented fever. No fatigue, weakness. No weight gain, no weight loss.  EYES: No blurry or double vision.  ENT: No tinnitus. No postnasal drip. No redness of the oropharynx.  RESPIRATORY: No cough, no wheeze, no hemoptysis. No dyspnea.  CARDIOVASCULAR: No chest pain. No orthopnea. No palpitations. No syncope.  GASTROINTESTINAL: No nausea, no vomiting or diarrhea.  Positive abdominal pain. No melena or hematochezia.  GENITOURINARY: No dysuria or hematuria.  ENDOCRINE: No polyuria or nocturia. No heat or cold intolerance.  HEMATOLOGY: No anemia. No bruising. No bleeding.  INTEGUMENTARY: No rashes. No lesions.  MUSCULOSKELETAL: No arthritis. No swelling. No gout.  NEUROLOGIC: No numbness, tingling, or ataxia. No seizure-type activity.  PSYCHIATRIC: No anxiety. No insomnia. No ADD.    Vitals:   Vitals:   08/17/17 1200 08/17/17 1256 08/17/17 1300 08/17/17 1400  BP: 101/64  94/78 101/73  Pulse: 77  73 82  Resp:      Temp:  97.8 F (36.6 C)    TempSrc:  Oral    SpO2: 100%  100% 99%  Weight:      Height:        Wt Readings from Last 3 Encounters:  08/17/17 124 lb 9 oz (56.5 kg)  07/02/17 175 lb (79.4 kg)  08/23/16 175 lb (79.4 kg)     Intake/Output Summary (Last 24 hours) at 08/17/2017 1438 Last data filed at 08/17/2017 0900 [place] per 24 hour  Intake 486.12 ml  Output 300 ml  Net 186.12 ml    Physical  Exam:   GENERAL: Pleasant-appearing in no apparent distress.  HEAD, EYES, EARS, NOSE AND THROAT: Atraumatic, normocephalic. Extraocular muscles are intact. Pupils equal and reactive to light. Sclerae anicteric. No conjunctival injection. No oro-pharyngeal erythema.  NECK: Supple. There is no jugular venous distention. No bruits, no lymphadenopathy, no thyromegaly.  HEART: Regular rate and rhythm,. No murmurs, no rubs, no clicks.  LUNGS: Clear to auscultation bilaterally. No rales or rhonchi. No wheezes.  ABDOMEN: Soft, flat, nontender, nondistended. Has good bowel sounds. No hepatosplenomegaly appreciated.  EXTREMITIES:  No evidence of any cyanosis, clubbing, or peripheral edema.  +2 pedal and radial pulses bilaterally.  NEUROLOGIC: The patient is alert, awake, and oriented x3 with no focal motor or sensory deficits appreciated bilaterally.  SKIN: Moist and warm with no rashes appreciated.  Psych: Not anxious, depressed LN: No inguinal LN enlargement    Antibiotics   Anti-infectives (From admission, onward)   None      Medications   Scheduled Meds: . feeding supplement (ENSURE ENLIVE)  237 mL Oral TID BM  . folic acid  1 mg Oral Daily  . heparin  5,000 Units Subcutaneous Q8H  . insulin aspart  0-5 Units Subcutaneous QHS  . insulin aspart  0-9 Units Subcutaneous TID WC  . insulin glargine  10 Units Subcutaneous Q24H  . LORazepam  0-4 mg Intravenous Q6H   Followed by  . [START ON 08/19/2017] LORazepam  0-4 mg Intravenous Q12H  . multivitamin with minerals  1 tablet Oral Daily  . pantoprazole  40 mg Oral Daily  . thiamine  100 mg Oral Daily   Or  . thiamine  100 mg Intravenous Daily   Continuous Infusions: . sodium chloride Stopped (08/17/17 0400)  . dextrose 5 % and 0.45% NaCl 125 mL/hr at 08/17/17 0700  . insulin (NOVOLIN-R) infusion Stopped (08/17/17 1302)  . sodium chloride     PRN Meds:.HYDROmorphone (DILAUDID) injection, LORazepam **OR** LORazepam   Data Review:    Micro Results Recent Results (from the past 240 hour(s))  MRSA PCR Screening     Status: None   Collection Time: 08/17/17 12:58 AM  Result Value Ref Range Status   MRSA by PCR NEGATIVE NEGATIVE Final    Comment:        The GeneXpert MRSA Assay (FDA approved for NASAL specimens only), is one component of a comprehensive MRSA colonization surveillance program. It is not intended to diagnose MRSA infection nor to guide or monitor treatment for MRSA infections. Performed at [facility], [address] [place], [place] [address]     Radiology Reports Ct Abdomen Pelvis Wo Contrast  Result Date: 08/16/2017 CLINICAL DATA:  Patient states he has abdominal pains that feel like pressure in nature. He states he has lost 50# in 2 weeks and taken weight tonight reflects that. Pt denies n/v/d. EXAM: CT ABDOMEN AND PELVIS WITHOUT CONTRAST TECHNIQUE: Multidetector CT imaging of the abdomen and pelvis was performed following the standard protocol without IV contrast. COMPARISON:  10/15/2016 FINDINGS: Lower chest: Clear lung bases.  Heart normal size. Hepatobiliary: No focal liver abnormality is seen. No gallstones, gallbladder wall thickening, or biliary dilatation. Pancreas: There is hazy opacity blurring the contour of the pancreas most evident along the pancreatic head, neck, body and proximal tail. There is subtle adjacent inflammatory type stranding. These findings are new from the prior CT. No pancreatic masses. No formed fluid collection to suggest an abscess or pseudocyst. Spleen: Normal in size without focal abnormality. Adrenals/Urinary Tract: 9 mm low-density mass arises from the lateral limb of the left adrenal gland consistent with an adenoma or nodular hyperplasia. Adrenals otherwise unremarkable. Kidneys normal size, orientation and position. No renal masses, stones or hydronephrosis. Normal ureters. Normal bladder. Stomach/Bowel: No bowel dilation to suggest obstruction. There is  no bowel wall thickening or adjacent inflammation. Stomach is unremarkable. Normal appendix visualized. Vascular/Lymphatic: Dense aortic atherosclerotic calcifications extending into the iliac vessels. No adenopathy. Reproductive: Unremarkable. Other: No abdominal wall hernia.  No ascites. Musculoskeletal: No fracture or acute finding. No osteoblastic or  osteolytic lesions. IMPRESSION: 1. Mild pancreatitis. 2. No other acute abnormality. 3. Aortic atherosclerosis. Electronically Signed   By: Lajean Manes M.D.   On: 08/16/2017 21:03     CBC Recent Labs  Lab 08/16/17 2011 08/17/17 0159  WBC 5.4 5.7  HGB 15.6 13.8  HCT 47.5 41.5  PLT 160 170  MCV 100.1* 98.0  MCH 32.8 32.5  MCHC 32.7 33.2  RDW 12.6 12.3    Chemistries  Recent Labs  Lab 08/16/17 2044 08/17/17 0159 08/17/17 0556 08/17/17 1014  NA 132* 136 147* 137  K 5.7* 3.8 5.0 3.8  CL 98* 106 112* 108  CO2 14* 17* 21* 21*  GLUCOSE 646* 419* 75 182*  BUN 27* 28* 27* 28*  CREATININE 2.19* 1.85* 1.87* 1.67*  CALCIUM 9.8 9.0 10.4* 8.9  MG  --   --   --  2.1  AST 16  --   --   --   ALT 16*  --   --   --   ALKPHOS 106  --   --   --   BILITOT 1.3*  --   --   --    ------------------------------------------------------------------------------------------------------------------ estimated creatinine clearance is 39 mL/min (A) (by C-G formula based on SCr of 1.67 mg/dL (H)). ------------------------------------------------------------------------------------------------------------------ Recent Labs    08/17/17 1014  HGBA1C 12.4*   ------------------------------------------------------------------------------------------------------------------ Recent Labs    08/17/17 1014  CHOL 172  HDL 47  LDLCALC 104*  TRIG 107  CHOLHDL 3.7   ------------------------------------------------------------------------------------------------------------------ No results for input(s): TSH, T4TOTAL, T3FREE, THYROIDAB in the last 72  hours.  Invalid input(s): FREET3 ------------------------------------------------------------------------------------------------------------------ No results for input(s): VITAMINB12, FOLATE, FERRITIN, TIBC, IRON, RETICCTPCT in the last 72 hours.  Coagulation profile No results for input(s): INR, PROTIME in the last 168 hours.  No results for input(s): DDIMER in the last 72 hours.  Cardiac Enzymes Recent Labs  Lab 08/16/17 2044  TROPONINI <0.03   ------------------------------------------------------------------------------------------------------------------ Invalid input(s): POCBNP    Assessment & Plan  Patient is a 57 year old with history of alcohol abuse and diabetes  #1  DKA (diabetic ketoacidoses) ([place]) -continue IV fluids and IV insulin until anion gap closes #2 Acute renal failure superimposed on stage 3 chronic kidney disease ([place]) improved with IV hydration #3  Pancreatitis, acute -due to alcohol abuse CT scan shows no evidence of gallstone #4  Alcohol abuse -CIWA protocol #5 HTN (hypertension) -continue home meds #6  GERD (gastroesophageal reflux disease) -home dose PPI #7 HLD (hyperlipidemia) -home dose antilipid       Code Status Orders  (From admission, onward)        Start     Ordered   08/17/17 0000  Full code  Continuous     08/16/17 2359    Code Status History    Date Active Date Inactive Code Status Order ID Comments User Context   06/29/2016 2124 07/01/2016 1805 Full Code [id number]  Vaughan Basta, MD Inpatient           Consults none  DVT Prophylaxis heparin  Lab Results  Component Value Date   PLT 170 08/17/2017     Time Spent in minutes   35 minutes greater than 50% of time spent in care coordination and counseling patient regarding the condition and plan of care.   Dustin Flock M.D on 08/17/2017 at 2:38 PM  Between 7am to 6pm - Pager - [phone]  After 6pm go to [email or web address] - Proofreader  [employer]   Office  [phone]

## 2017-08-17 NOTE — Consult Note (Signed)
PULMONARY / CRITICAL CARE MEDICINE   Name: [name] MRN: [id number] DOB: November 30, 1960    ADMISSION DATE:  08/16/2017   CONSULTATION DATE: 08/17/2017  REFERRING MD: Dr. Jannifer Franklin  Reason: DKA with pancreatitis  HISTORY OF PRESENT ILLNESS:   This is a 57 year old African-American male with a history of type 2 diabetes, EtOH abuse, CKD, hyperlipidemia hypertension and previous CVA who presented to the ED with complaints of abdominal pain nausea vomiting and severe anorexia for 2 weeks.  States that symptoms started gradually and got worse over time.  At the ED, his workup showed a blood glucose level of 646 mg/dL, a potassium of 5.7, creatinine of 2.19 and a lipase level of 192.  His urinalysis was positive for ketones and glucosuria and proteinuria.  He reports severe anorexia and progressive weight loss.  States that each time he eats he vomits.  He is being admitted to the ICU for further management  PAST MEDICAL HISTORY :  He  has a past medical history of Chronic kidney disease, Diabetes mellitus without complication ([place]), Hyperlipidemia, Hypertension, and Stroke ([place]).  PAST SURGICAL HISTORY: He  has a past surgical history that includes Esophagogastroduodenoscopy (egd) with propofol (N/A, 07/01/2016).  No Known Allergies  No current facility-administered medications on file prior to encounter.    Current Outpatient Medications on File Prior to Encounter  Medication Sig  . cephALEXin (KEFLEX) 500 MG capsule Take 1 capsule (500 mg total) by mouth 2 (two) times daily.  [name] docusate sodium (COLACE) 100 MG capsule Take 1 capsule (100 mg total) by mouth 2 (two) times daily as needed for mild constipation.  . ferrous gluconate (FERGON) 324 MG tablet Take 1 tablet (324 mg total) by mouth 2 (two) times daily with a meal.  . fluconazole (DIFLUCAN) 100 MG tablet Take 1 tablet (100 mg total) by mouth daily.  . folic acid (FOLVITE) 1 MG tablet Take 1 tablet (1 mg total) by mouth daily.  . Multiple  Vitamin (MULTIVITAMIN WITH MINERALS) TABS tablet Take 1 tablet by mouth daily.  . nicotine (NICODERM CQ - DOSED IN MG/24 HOURS) 14 mg/24hr patch Place 1 patch (14 mg total) onto the skin daily.  . ondansetron (ZOFRAN ODT) 4 MG disintegrating tablet Take 1 tablet (4 mg total) by mouth every 8 (eight) hours as needed for nausea or vomiting.  [name] oxyCODONE-acetaminophen (ROXICET) 5-325 MG tablet Take 1 tablet by mouth every 6 (six) hours as needed for severe pain.  . pantoprazole (PROTONIX) 40 MG tablet Take 1 tablet (40 mg total) by mouth daily.  . sucralfate (CARAFATE) 1 g tablet Take 1 tablet (1 g total) by mouth 4 (four) times daily.  [name] thiamine 100 MG tablet Take 1 tablet (100 mg total) by mouth daily.    FAMILY HISTORY:  His indicated that the status of his brother is unknown.   SOCIAL HISTORY: He  reports that he has been smoking cigars.  He has been smoking about 0.25 packs per day. He has never used smokeless tobacco. He reports that he drinks about 6.0 oz of alcohol per week. He reports that he does not use drugs.  REVIEW OF SYSTEMS:   Constitutional: Negative for fever and chills.  HENT: Negative for congestion and rhinorrhea.  Eyes: Negative for redness and visual disturbance.  Respiratory: Negative for shortness of breath and wheezing.  Cardiovascular: Negative for chest pain and palpitations.  Gastrointestinal: Positive for nausea , vomiting and abdominal pain but negative for loose stools Genitourinary: Negative for dysuria  and urgency.  Endocrine: Denies polyuria, polyphagia and heat intolerance Musculoskeletal: Negative for myalgias and arthralgias.  Skin: Negative for pallor and wound.  Neurological: Negative for dizziness and headaches   SUBJECTIVE:   VITAL SIGNS: BP (!) 135/96   Pulse 84   Temp 97.9 F (36.6 C) (Oral)   Resp 17   Wt 123 lb 14.4 oz (56.2 kg)   SpO2 100%   BMI 17.28 kg/m   HEMODYNAMICS:    VENTILATOR SETTINGS:    INTAKE / OUTPUT: No  intake/output data recorded.  PHYSICAL EXAMINATION: General: Older for age, in moderate distress Neuro: Alert and oriented x4, no focal deficits HEENT: PERRLA, trachea midline, no JVD Cardiovascular:  RRR, S1-S2, no murmur regurg or gallop, +2 pulses bilaterally, no edema Lungs: Bilateral breath sounds without any wheezes or rhonchi Abdomen: Nondistended, normal bowel sounds in all 4 quadrants, palpation reveals diffuse tenderness mostly in the right upper quadrant Musculoskeletal: Positive range of motion in upper and lower extremities no joint deformities Skin: Warm and dry, poor turgor  LABS:  BMET Recent Labs  Lab 08/16/17 2044  NA 132*  K 5.7*  CL 98*  CO2 14*  BUN 27*  CREATININE 2.19*  GLUCOSE 646*    Electrolytes Recent Labs  Lab 08/16/17 2044  CALCIUM 9.8    CBC Recent Labs  Lab 08/16/17 2011  WBC 5.4  HGB 15.6  HCT 47.5  PLT 160    Coag's No results for input(s): APTT, INR in the last 168 hours.  Sepsis Markers No results for input(s): LATICACIDVEN, PROCALCITON, O2SATVEN in the last 168 hours.  ABG No results for input(s): PHART, PCO2ART, PO2ART in the last 168 hours.  Liver Enzymes Recent Labs  Lab 08/16/17 2044  AST 16  ALT 16*  ALKPHOS 106  BILITOT 1.3*  ALBUMIN 3.7    Cardiac Enzymes Recent Labs  Lab 08/16/17 2044  TROPONINI <0.03    Glucose Recent Labs  Lab 08/16/17 2305 08/17/17 0018 08/17/17 0047  GLUCAP 552* 528* >600*    Imaging Ct Abdomen Pelvis Wo Contrast  Result Date: 08/16/2017 CLINICAL DATA:  Patient states he has abdominal pains that feel like pressure in nature. He states he has lost 50# in 2 weeks and taken weight tonight reflects that. Pt denies n/v/d. EXAM: CT ABDOMEN AND PELVIS WITHOUT CONTRAST TECHNIQUE: Multidetector CT imaging of the abdomen and pelvis was performed following the standard protocol without IV contrast. COMPARISON:  10/15/2016 FINDINGS: Lower chest: Clear lung bases.  Heart normal size.  Hepatobiliary: No focal liver abnormality is seen. No gallstones, gallbladder wall thickening, or biliary dilatation. Pancreas: There is hazy opacity blurring the contour of the pancreas most evident along the pancreatic head, neck, body and proximal tail. There is subtle adjacent inflammatory type stranding. These findings are new from the prior CT. No pancreatic masses. No formed fluid collection to suggest an abscess or pseudocyst. Spleen: Normal in size without focal abnormality. Adrenals/Urinary Tract: 9 mm low-density mass arises from the lateral limb of the left adrenal gland consistent with an adenoma or nodular hyperplasia. Adrenals otherwise unremarkable. Kidneys normal size, orientation and position. No renal masses, stones or hydronephrosis. Normal ureters. Normal bladder. Stomach/Bowel: No bowel dilation to suggest obstruction. There is no bowel wall thickening or adjacent inflammation. Stomach is unremarkable. Normal appendix visualized. Vascular/Lymphatic: Dense aortic atherosclerotic calcifications extending into the iliac vessels. No adenopathy. Reproductive: Unremarkable. Other: No abdominal wall hernia.  No ascites. Musculoskeletal: No fracture or acute finding. No osteoblastic or osteolytic lesions. IMPRESSION:  1. Mild pancreatitis. 2. No other acute abnormality. 3. Aortic atherosclerosis. Electronically Signed   By: Lajean Manes M.D.   On: 08/16/2017 21:03    ASSESSMENT  DKA AKI Acute pancreatitis Alcohol abuse GERD Hyperlipidemia Hypertension Protein caloric malnutrition secondary to alcohol abuse  PLAN DKA protocol Aggressive IV fluid resuscitation Nutritional consult CIWA protocol Social service consult self-care inability Continue all home medications Ensure 3 times daily Trend LFTs Trend creatinine and avoid nephrotoxic drugs GI and DVT prophylaxis   FAMILY  - Updates: No family at bedside.  Will update when available   Anina Schnake S. [facility] ANP-BC Pulmonary and  Critical Care Medicine [facility] Pager [phone] or [phone]  NB: This document was prepared using Dragon voice recognition software and may include unintentional dictation errors.   08/17/2017, 1:37 AM

## 2017-08-18 LAB — BASIC METABOLIC PANEL
Anion gap: 7 (ref 5–15)
BUN: 29 mg/dL — ABNORMAL HIGH (ref 6–20)
CO2: 20 mmol/L — ABNORMAL LOW (ref 22–32)
Calcium: 8.9 mg/dL (ref 8.9–10.3)
Chloride: 108 mmol/L (ref 101–111)
Creatinine, Ser: 1.52 mg/dL — ABNORMAL HIGH (ref 0.61–1.24)
GFR calc Af Amer: 57 mL/min — ABNORMAL LOW (ref >60)
GFR calc non Af Amer: 49 mL/min — ABNORMAL LOW (ref >60)
Glucose, Bld: 317 mg/dL — ABNORMAL HIGH (ref 65–99)
Potassium: 3.8 mmol/L (ref 3.5–5.1)
Sodium: 135 mmol/L (ref 135–145)

## 2017-08-18 LAB — GLUCOSE, CAPILLARY
Glucose-Capillary: 103 mg/dL — ABNORMAL HIGH (ref 65–99)
Glucose-Capillary: 78 mg/dL (ref 65–99)
Glucose-Capillary: 476 mg/dL — ABNORMAL HIGH (ref 65–99)
Glucose-Capillary: 136 mg/dL — ABNORMAL HIGH (ref 65–99)
Glucose-Capillary: 70 mg/dL (ref 65–99)
Glucose-Capillary: 206 mg/dL — ABNORMAL HIGH (ref 65–99)

## 2017-08-18 LAB — HIV ANTIBODY (ROUTINE TESTING W REFLEX): HIV Screen 4th Generation wRfx: NONREACTIVE

## 2017-08-18 MED ORDER — INSULIN GLARGINE 100 UNIT/ML ~~LOC~~ SOLN
25.0000 [IU] | Freq: Every day | SUBCUTANEOUS | Status: DC
  Administered 2017-08-18: 25 [IU] via SUBCUTANEOUS
  Filled 2017-08-18: qty 0.25

## 2017-08-18 MED ORDER — INSULIN GLARGINE 100 UNIT/ML ~~LOC~~ SOLN
15.0000 [IU] | Freq: Every day | SUBCUTANEOUS | Status: DC
  Administered 2017-08-19 – 2017-08-20 (×2): 15 [IU] via SUBCUTANEOUS
  Filled 2017-08-18 (×2): qty 0.15

## 2017-08-18 MED ORDER — INSULIN ASPART 100 UNIT/ML ~~LOC~~ SOLN
20.0000 [IU] | Freq: Once | SUBCUTANEOUS | Status: AC
  Administered 2017-08-18: 20 [IU] via SUBCUTANEOUS
  Filled 2017-08-18: qty 1

## 2017-08-18 NOTE — Progress Notes (Signed)
Initial Nutrition Assessment  DOCUMENTATION CODES:   Severe malnutrition in context of chronic illness, Underweight  INTERVENTION:  Will discontinue Ensure for now as patient is on clear liquids.  Once diet able to be advanced recommend providing Premier protein po TID, each supplement provides 160 kcal and 30 grams of protein.  Continue MVI daily, thiamine 100 mg daily, and folic acid 1 mg daily.  Prior to discharge patient would benefit from education on a carbohydrate-controlled diet with adequate calories and protein for slow weight gain. RD will follow-up for education.  NUTRITION DIAGNOSIS:   Severe Malnutrition related to chronic illness(CKD, EtOH abuse) as evidenced by severe fat depletion, moderate muscle depletion, severe muscle depletion.  GOAL:   Patient will meet greater than or equal to 90% of their needs  MONITOR:   PO intake, Supplement acceptance, Diet advancement, Labs, Weight trends, I & O's  REASON FOR ASSESSMENT:   Malnutrition Screening Tool, Consult Assessment of nutrition requirement/status  ASSESSMENT:   57 year old male with PMHx of DM type 2, HTN, CKD, HLD, EtOH abuse, hx CVA who was admitted with DKA, AKI on CKD, acute pancreatitis in setting of EtOH abuse.   -Patient was initially placed on heart healthy/carbohydrate modified diet on 4/6 but was downgraded to clear liquids today in setting of patient's abdominal pain.  Met with patient at bedside. He reports he has had a poor appetite for a year now. He is unsure exactly what has caused his poor appetite. He endorses occasional abdominal pain (currently having pain) and occasional constipation. He reports he is only eating 50% of less of his meals now. Unable to provide any more specifics on meals at this time. He reports he wants to rest. He has had DM for 7-8 years now. He is not sure if he has ever received any education on managing DM with nutrition. He would also like to learn about eating  enough to start gaining some of his weight back. He requests this information on follow-up so he can rest now.  UBS 170 lbs. Patient was 171.6 lbs on 06/29/2016. He has lost 47 lbs (27.4% body weight) over the past year, which is significant for time frame.  Medications reviewed and include: Ensure Enlive TID, folic acid 1 mg daily, Novolog 0-9 units TID, Novolog 0-5 units QHS, Lantus 15 units daily, MVI daily, pantoprazole, thiamine 100 mg daily. Home medications include glimepiride 1 tablet daily.  Labs reviewed: CBG 89-476 past 24 hrs, CO2 20, BUN 29, Creatinine 1.52. Potassium WNL today, Yesterday Potassium, Phosphorus and Magnesium were all WNL. HgbA1c 12.4 on 4/6. Lipase 146 on 4/6.  NUTRITION - FOCUSED PHYSICAL EXAM:    Most Recent Value  Orbital Region  Severe depletion  Upper Arm Region  Moderate depletion  Thoracic and Lumbar Region  Severe depletion  Buccal Region  Severe depletion  Temple Region  Moderate depletion  Clavicle Bone Region  Moderate depletion  Clavicle and Acromion Bone Region  Severe depletion  Scapular Bone Region  Moderate depletion  Dorsal Hand  Severe depletion  Patellar Region  Severe depletion  Anterior Thigh Region  Severe depletion  Posterior Calf Region  Severe depletion  Edema (RD Assessment)  None  Hair  Reviewed  Eyes  Reviewed  Mouth  Reviewed  Skin  Reviewed  Nails  Reviewed     Diet Order:  Diet clear liquid Room service appropriate? Yes; Fluid consistency: Thin  EDUCATION NEEDS:   Not appropriate for education at this time  Skin:  Skin Assessment: Reviewed RN Assessment  Last BM:  08/18/2017 - large type 4  Height:   Ht Readings from Last 1 Encounters:  08/17/17 6' (1.829 m)    Weight:   Wt Readings from Last 1 Encounters:  08/17/17 124 lb 9 oz (56.5 kg)    Ideal Body Weight:  80.9 kg  BMI:  Body mass index is 16.89 kg/m.  Estimated Nutritional Needs:   Kcal:  1860-2000 (MSJ x 1.3-1.4)  Protein:  85-100 grams  (1.5-1.7 grams/kg)  Fluid:  1.7-2 L/day (30-35 mL/kg)  Willey Blade, MS, RD, LDN Office: [phone] Pager: [phone] After Hours/Weekend Pager: [phone]

## 2017-08-18 NOTE — Progress Notes (Signed)
[facility]                                                                                                                                                                                  Patient Demographics   [name], is a 57 y.o. male, DOB - 05-Jun-1960, ZOX:[id number]  Admit date - 08/16/2017   Admitting Physician Lance Coon, MD  Outpatient Primary MD for the patient is [name], MD   LOS - 2  Subjective: Patient continues to have abdominal pain he was on a regular diet when transferred from the ICU    Review of Systems:   CONSTITUTIONAL: No documented fever. No fatigue, weakness. No weight gain, no weight loss.  EYES: No blurry or double vision.  ENT: No tinnitus. No postnasal drip. No redness of the oropharynx.  RESPIRATORY: No cough, no wheeze, no hemoptysis. No dyspnea.  CARDIOVASCULAR: No chest pain. No orthopnea. No palpitations. No syncope.  GASTROINTESTINAL: No nausea, no vomiting or diarrhea.  Positive abdominal pain. No melena or hematochezia.  GENITOURINARY: No dysuria or hematuria.  ENDOCRINE: No polyuria or nocturia. No heat or cold intolerance.  HEMATOLOGY: No anemia. No bruising. No bleeding.  INTEGUMENTARY: No rashes. No lesions.  MUSCULOSKELETAL: No arthritis. No swelling. No gout.  NEUROLOGIC: No numbness, tingling, or ataxia. No seizure-type activity.  PSYCHIATRIC: No anxiety. No insomnia. No ADD.    Vitals:   Vitals:   08/18/17 0010 08/18/17 0319 08/18/17 0600 08/18/17 0828  BP: 108/60 113/81 113/81 116/81  Pulse: 84 83 83 95  Resp: 16 18    Temp: 98.4 F (36.9 C) 97.8 F (36.6 C)  98.3 F (36.8 C)  TempSrc:  Oral  Oral  SpO2: 100% 97%  100%  Weight:      Height:        Wt Readings from Last 3 Encounters:  08/17/17 124 lb 9 oz (56.5 kg)  07/02/17 175 lb (79.4 kg)  08/23/16 175 lb (79.4 kg)     Intake/Output Summary (Last 24 hours) at 08/18/2017 1255 Last data filed at 08/18/2017  1224 Gross per 24 hour  Intake 1134.87 ml  Output 520 ml  Net 614.87 ml    Physical Exam:   GENERAL: Pleasant-appearing in no apparent distress.  HEAD, EYES, EARS, NOSE AND THROAT: Atraumatic, normocephalic. Extraocular muscles are intact. Pupils equal and reactive to light. Sclerae anicteric. No conjunctival injection. No oro-pharyngeal erythema.  NECK: Supple. There is no jugular venous distention. No bruits, no lymphadenopathy, no thyromegaly.  HEART: Regular rate and rhythm,. No murmurs, no rubs, no clicks.  LUNGS: Clear to auscultation bilaterally. No rales or rhonchi. No wheezes.  ABDOMEN:  Soft, flat, nontender, nondistended. Has good bowel sounds. No hepatosplenomegaly appreciated.  EXTREMITIES: No evidence of any cyanosis, clubbing, or peripheral edema.  +2 pedal and radial pulses bilaterally.  NEUROLOGIC: The patient is alert, awake, and oriented x3 with no focal motor or sensory deficits appreciated bilaterally.  SKIN: Moist and warm with no rashes appreciated.  Psych: Not anxious, depressed LN: No inguinal LN enlargement    Antibiotics   Anti-infectives (From admission, onward)   None      Medications   Scheduled Meds: . feeding supplement (ENSURE ENLIVE)  237 mL Oral TID BM  . folic acid  1 mg Oral Daily  . heparin  5,000 Units Subcutaneous Q8H  . insulin aspart  0-5 Units Subcutaneous QHS  . insulin aspart  0-9 Units Subcutaneous TID WC  . insulin glargine  25 Units Subcutaneous Daily  . LORazepam  0-4 mg Intravenous Q6H   Followed by  . [START ON 08/19/2017] LORazepam  0-4 mg Intravenous Q12H  . multivitamin with minerals  1 tablet Oral Daily  . pantoprazole  40 mg Oral Daily  . thiamine  100 mg Oral Daily   Or  . thiamine  100 mg Intravenous Daily   Continuous Infusions: . sodium chloride     PRN Meds:.HYDROmorphone (DILAUDID) injection, LORazepam **OR** LORazepam   Data Review:   Micro Results Recent Results (from the past 240 hour(s))  MRSA PCR  Screening     Status: None   Collection Time: 08/17/17 12:58 AM  Result Value Ref Range Status   MRSA by PCR NEGATIVE NEGATIVE Final    Comment:        The GeneXpert MRSA Assay (FDA approved for NASAL specimens only), is one component of a comprehensive MRSA colonization surveillance program. It is not intended to diagnose MRSA infection nor to guide or monitor treatment for MRSA infections. Performed at [facility], [address] [place], [place] [address]     Radiology Reports Ct Abdomen Pelvis Wo Contrast  Result Date: 08/16/2017 CLINICAL DATA:  Patient states he has abdominal pains that feel like pressure in nature. He states he has lost 50# in 2 weeks and taken weight tonight reflects that. Pt denies n/v/d. EXAM: CT ABDOMEN AND PELVIS WITHOUT CONTRAST TECHNIQUE: Multidetector CT imaging of the abdomen and pelvis was performed following the standard protocol without IV contrast. COMPARISON:  10/15/2016 FINDINGS: Lower chest: Clear lung bases.  Heart normal size. Hepatobiliary: No focal liver abnormality is seen. No gallstones, gallbladder wall thickening, or biliary dilatation. Pancreas: There is hazy opacity blurring the contour of the pancreas most evident along the pancreatic head, neck, body and proximal tail. There is subtle adjacent inflammatory type stranding. These findings are new from the prior CT. No pancreatic masses. No formed fluid collection to suggest an abscess or pseudocyst. Spleen: Normal in size without focal abnormality. Adrenals/Urinary Tract: 9 mm low-density mass arises from the lateral limb of the left adrenal gland consistent with an adenoma or nodular hyperplasia. Adrenals otherwise unremarkable. Kidneys normal size, orientation and position. No renal masses, stones or hydronephrosis. Normal ureters. Normal bladder. Stomach/Bowel: No bowel dilation to suggest obstruction. There is no bowel wall thickening or adjacent inflammation. Stomach is  unremarkable. Normal appendix visualized. Vascular/Lymphatic: Dense aortic atherosclerotic calcifications extending into the iliac vessels. No adenopathy. Reproductive: Unremarkable. Other: No abdominal wall hernia.  No ascites. Musculoskeletal: No fracture or acute finding. No osteoblastic or osteolytic lesions. IMPRESSION: 1. Mild pancreatitis. 2. No other acute abnormality. 3. Aortic atherosclerosis. Electronically  Signed   By: Lajean Manes M.D.   On: 08/16/2017 21:03     CBC Recent Labs  Lab 08/16/17 2011 08/17/17 0159  WBC 5.4 5.7  HGB 15.6 13.8  HCT 47.5 41.5  PLT 160 170  MCV 100.1* 98.0  MCH 32.8 32.5  MCHC 32.7 33.2  RDW 12.6 12.3    Chemistries  Recent Labs  Lab 08/16/17 2044 08/17/17 0159 08/17/17 0556 08/17/17 1014 08/18/17 0304  NA 132* 136 147* 137 135  K 5.7* 3.8 5.0 3.8 3.8  CL 98* 106 112* 108 108  CO2 14* 17* 21* 21* 20*  GLUCOSE 646* 419* 75 182* 317*  BUN 27* 28* 27* 28* 29*  CREATININE 2.19* 1.85* 1.87* 1.67* 1.52*  CALCIUM 9.8 9.0 10.4* 8.9 8.9  MG  --   --   --  2.1  --   AST 16  --   --   --   --   ALT 16*  --   --   --   --   ALKPHOS 106  --   --   --   --   BILITOT 1.3*  --   --   --   --    ------------------------------------------------------------------------------------------------------------------ estimated creatinine clearance is 42.8 mL/min (A) (by C-G formula based on SCr of 1.52 mg/dL (H)). ------------------------------------------------------------------------------------------------------------------ Recent Labs    08/17/17 1014  HGBA1C 12.4*   ------------------------------------------------------------------------------------------------------------------ Recent Labs    08/17/17 1014  CHOL 172  HDL 47  LDLCALC 104*  TRIG 107  CHOLHDL 3.7   ------------------------------------------------------------------------------------------------------------------ No results for input(s): TSH, T4TOTAL, T3FREE, THYROIDAB in the  last 72 hours.  Invalid input(s): FREET3 ------------------------------------------------------------------------------------------------------------------ No results for input(s): VITAMINB12, FOLATE, FERRITIN, TIBC, IRON, RETICCTPCT in the last 72 hours.  Coagulation profile No results for input(s): INR, PROTIME in the last 168 hours.  No results for input(s): DDIMER in the last 72 hours.  Cardiac Enzymes Recent Labs  Lab 08/16/17 2044  TROPONINI <0.03   ------------------------------------------------------------------------------------------------------------------ Invalid input(s): POCBNP    Assessment & Plan  Patient is a 57 year old with history of alcohol abuse and diabetes  #1  DKA (diabetic ketoacidoses) ([place]) -resolved, blood sugars were very high this morning started on Lantus #2 Acute renal failure superimposed on stage 3 chronic kidney disease ([place]) improved with IV hydration #3  Pancreatitis, acute -due to alcohol abuse CT scan shows no evidence of gallstone, due to persistent pain I will change patient to clear liquid diet #4  Alcohol abuse -continue CIWA protocol #5 HTN (hypertension) -continue home meds #6  GERD (gastroesophageal reflux disease) -home dose PPI #7 HLD (hyperlipidemia) -home dose antilipid       Code Status Orders  (From admission, onward)        Start     Ordered   08/17/17 0000  Full code  Continuous     08/16/17 2359    Code Status History    Date Active Date Inactive Code Status Order ID Comments User Context   06/29/2016 2124 07/01/2016 1805 Full Code [id number]  Vaughan Basta, MD Inpatient           Consults none  DVT Prophylaxis heparin  Lab Results  Component Value Date   PLT 170 08/17/2017     Time Spent in minutes   35 minutes greater than 50% of time spent in care coordination and counseling patient regarding the condition and plan of care.   Dustin Flock M.D on 08/18/2017 at 12:55 PM  Between 7am  to  6pm - Pager - [phone]  After 6pm go to [email or web address] - Proofreader  [employer]   Office  [phone]

## 2017-08-19 DIAGNOSIS — E43 Unspecified severe protein-calorie malnutrition: Secondary | ICD-10-CM

## 2017-08-19 LAB — CBC
HCT: 36.6 % — ABNORMAL LOW (ref 40.0–52.0)
Hemoglobin: 12.2 g/dL — ABNORMAL LOW (ref 13.0–18.0)
MCH: 32.5 pg (ref 26.0–34.0)
MCHC: 33.2 g/dL (ref 32.0–36.0)
MCV: 97.8 fL (ref 80.0–100.0)
Platelets: 153 10*3/uL (ref 150–440)
RBC: 3.74 MIL/uL — ABNORMAL LOW (ref 4.40–5.90)
RDW: 12.3 % (ref 11.5–14.5)
WBC: 2.8 10*3/uL — ABNORMAL LOW (ref 3.8–10.6)

## 2017-08-19 LAB — BASIC METABOLIC PANEL
Anion gap: 5 (ref 5–15)
BUN: 27 mg/dL — ABNORMAL HIGH (ref 6–20)
CO2: 21 mmol/L — ABNORMAL LOW (ref 22–32)
Calcium: 8.3 mg/dL — ABNORMAL LOW (ref 8.9–10.3)
Chloride: 112 mmol/L — ABNORMAL HIGH (ref 101–111)
Creatinine, Ser: 1.02 mg/dL (ref 0.61–1.24)
GFR calc Af Amer: >60 mL/min (ref >60)
GFR calc non Af Amer: >60 mL/min (ref >60)
Glucose, Bld: 48 mg/dL — ABNORMAL LOW (ref 65–99)
Potassium: 3.2 mmol/L — ABNORMAL LOW (ref 3.5–5.1)
Sodium: 138 mmol/L (ref 135–145)

## 2017-08-19 LAB — GLUCOSE, CAPILLARY
Glucose-Capillary: 47 mg/dL — ABNORMAL LOW (ref 65–99)
Glucose-Capillary: 58 mg/dL — ABNORMAL LOW (ref 65–99)
Glucose-Capillary: 158 mg/dL — ABNORMAL HIGH (ref 65–99)
Glucose-Capillary: 432 mg/dL — ABNORMAL HIGH (ref 65–99)
Glucose-Capillary: 161 mg/dL — ABNORMAL HIGH (ref 65–99)
Glucose-Capillary: 162 mg/dL — ABNORMAL HIGH (ref 65–99)

## 2017-08-19 MED ORDER — LIVING WELL WITH DIABETES BOOK
Freq: Once | Status: AC
  Administered 2017-08-19: 12:00:00
  Filled 2017-08-19: qty 1

## 2017-08-19 MED ORDER — PREMIER PROTEIN SHAKE
11.0000 [oz_av] | Freq: Three times a day (TID) | ORAL | Status: DC
Start: 2017-08-19 — End: 2017-08-20
  Administered 2017-08-19 – 2017-08-20 (×3): 11 [oz_av] via ORAL

## 2017-08-19 MED ORDER — INSULIN STARTER KIT- SYRINGES (ENGLISH)
1.0000 | Freq: Once | Status: AC
  Administered 2017-08-19: 1
  Filled 2017-08-19: qty 1

## 2017-08-19 MED ORDER — INSULIN STARTER KIT- PEN NEEDLES (ENGLISH)
1.0000 | Freq: Once | Status: DC
  Filled 2017-08-19: qty 1

## 2017-08-19 NOTE — Progress Notes (Signed)
Patient is requesting solid food.  He is not having any abdominal pain.  Dr Posey Pronto gave an order for carb diet

## 2017-08-19 NOTE — Clinical Social Work Note (Signed)
Clinical Social Work Assessment  Patient Details  Name: [name] MRN: [id number] Date of Birth: 25-Aug-1960  Date of referral:  08/19/17               Reason for consult:  Substance Use/ETOH Abuse                Permission sought to share information with:    Permission granted to share information::     Name::        Agency::     Relationship::     Contact Information:     Housing/Transportation Living arrangements for the past 2 months:  Single Family Home Source of Information:  Patient Patient Interpreter Needed:  None Criminal Activity/Legal Involvement Pertinent to Current Situation/Hospitalization:  No - Comment as needed Significant Relationships:  Spouse Lives with:  Spouse Do you feel safe going back to the place where you live?  Yes Need for family participation in patient care:  Yes (Comment)  Care giving concerns:  Patient lives in [place] with his wife [name].    Social Worker assessment / plan:  Holiday representative (CSW) received ETOH consult. CSW met with patient alone at bedside to provide resources. Patient was alert and oriented X4 and was laying in the bed. Patient had the covers pulled over his head and would not make eye contact with CSW. CSW introduced self and explained role of CSW department. Patient reported that he lives with her wife [name] and she provides transportation for him. Patient reported that he does not work and is on disability. Patient reported that he can walk at baseline however he has to hold on to the walls at home. Patient denied drug use. Patient reported that he does drink beer weekly. When CSW asked how much he drinks he stated "I don't know." Patient was guarded with his answers. CSW provided outpatient substance abuse resources to patient. Patient reported that he is not interested in substance abuse treatment unless he gets paid to go to counseling. CSW explained that he will not get paid to go to counseling however it will  benefit his wellbeing and mental health. Patient reported that "his time is important and will not be going to counseling." CSW left substance abuse resources at bedside. CSW will continue to follow and assist as needed.   Employment status:  Disabled (Comment on whether or not currently receiving Disability) Insurance information:  Medicare PT Recommendations:  Not assessed at this time Information / Referral to community resources:  Outpatient Substance Abuse Treatment Options  Patient/Family's Response to care:  Patient reported that he was not interested in substance abuse treatment.   Patient/Family's Understanding of and Emotional Response to Diagnosis, Current Treatment, and Prognosis:  Patient is not be motivated to change any behaviors around his substance use.   Emotional Assessment Appearance:  Appears stated age Attitude/Demeanor/Rapport:  Guarded, Hostile, Avoidant Affect (typically observed):    Orientation:  Oriented to Self, Oriented to Place, Oriented to  Time, Oriented to Situation Alcohol / Substance use:  Alcohol Use Psych involvement (Current and /or in the community):  No (Comment)  Discharge Needs  Concerns to be addressed:  Discharge Planning Concerns Readmission within the last 30 days:  No Current discharge risk:  Substance Abuse Barriers to Discharge:  Continued Medical Work up   [employer], Veronia Beets, LCSW 08/19/2017, 4:12 PM

## 2017-08-19 NOTE — Progress Notes (Signed)
Blood sugar low this am. 47 then 58 then 158.  Dr Posey Pronto requested that the patient receive his am lantus but not to give the patient any meal coverage at this time

## 2017-08-19 NOTE — Progress Notes (Signed)
[place] at [facility]                                                                                                                                                                                  Patient Demographics   [name], is a 57 y.o. male, DOB - 1960-12-03, [id number]  Admit date - 08/16/2017   Admitting Physician Lance Coon, MD  Outpatient Primary MD for the patient is [name], MD   LOS - 3  Subjective: Patient states abdominal pain resolved wants to eat solid food   Review of Systems:   CONSTITUTIONAL: No documented fever. No fatigue, weakness. No weight gain, no weight loss.  EYES: No blurry or double vision.  ENT: No tinnitus. No postnasal drip. No redness of the oropharynx.  RESPIRATORY: No cough, no wheeze, no hemoptysis. No dyspnea.  CARDIOVASCULAR: No chest pain. No orthopnea. No palpitations. No syncope.  GASTROINTESTINAL: No nausea, no vomiting or diarrhea.  Resolved abdominal pain. No melena or hematochezia.  GENITOURINARY: No dysuria or hematuria.  ENDOCRINE: No polyuria or nocturia. No heat or cold intolerance.  HEMATOLOGY: No anemia. No bruising. No bleeding.  INTEGUMENTARY: No rashes. No lesions.  MUSCULOSKELETAL: No arthritis. No swelling. No gout.  NEUROLOGIC: No numbness, tingling, or ataxia. No seizure-type activity.  PSYCHIATRIC: No anxiety. No insomnia. No ADD.    Vitals:   Vitals:   08/18/17 1700 08/18/17 1934 08/18/17 2345 08/19/17 0758  BP: (!) 108/56 116/76 (!) 131/91 (!) 120/95  Pulse: 75 81 82 78  Resp:  17 18   Temp: (!) 97.5 F (36.4 C) 98.3 F (36.8 C) 97.9 F (36.6 C) 98.2 F (36.8 C)  TempSrc: Oral Oral Oral Oral  SpO2: 100% 100% 100% 100%  Weight:      Height:        Wt Readings from Last 3 Encounters:  08/17/17 56.5 kg (124 lb 9 oz)  07/02/17 79.4 kg (175 lb)  08/23/16 79.4 kg (175 lb)     Intake/Output Summary (Last 24 hours) at 08/19/2017 1438 Last data filed at  08/19/2017 1331 Gross per 24 hour  Intake 480 ml  Output 900 ml  Net -420 ml    Physical Exam:   GENERAL: Pleasant-appearing in no apparent distress.  HEAD, EYES, EARS, NOSE AND THROAT: Atraumatic, normocephalic. Extraocular muscles are intact. Pupils equal and reactive to light. Sclerae anicteric. No conjunctival injection. No oro-pharyngeal erythema.  NECK: Supple. There is no jugular venous distention. No bruits, no lymphadenopathy, no thyromegaly.  HEART: Regular rate and rhythm,. No murmurs, no rubs, no clicks.  LUNGS: Clear to auscultation bilaterally. No rales or rhonchi. No wheezes.  ABDOMEN: Soft,  flat, nontender, nondistended. Has good bowel sounds. No hepatosplenomegaly appreciated.  EXTREMITIES: No evidence of any cyanosis, clubbing, or peripheral edema.  +2 pedal and radial pulses bilaterally.  NEUROLOGIC: The patient is alert, awake, and oriented x3 with no focal motor or sensory deficits appreciated bilaterally.  SKIN: Moist and warm with no rashes appreciated.  Psych: Not anxious, depressed LN: No inguinal LN enlargement    Antibiotics   Anti-infectives (From admission, onward)   None      Medications   Scheduled Meds: . folic acid  1 mg Oral Daily  . heparin  5,000 Units Subcutaneous Q8H  . insulin aspart  0-5 Units Subcutaneous QHS  . insulin aspart  0-9 Units Subcutaneous TID WC  . insulin glargine  15 Units Subcutaneous Daily  . insulin starter kit- syringes  1 kit Other Once  . LORazepam  0-4 mg Intravenous Q12H  . multivitamin with minerals  1 tablet Oral Daily  . pantoprazole  40 mg Oral Daily  . protein supplement shake  11 oz Oral TID BM  . thiamine  100 mg Oral Daily   Or  . thiamine  100 mg Intravenous Daily   Continuous Infusions: . sodium chloride     PRN Meds:.HYDROmorphone (DILAUDID) injection, LORazepam **OR** LORazepam   Data Review:   Micro Results Recent Results (from the past 240 hour(s))  MRSA PCR Screening     Status: None    Collection Time: 08/17/17 12:58 AM  Result Value Ref Range Status   MRSA by PCR NEGATIVE NEGATIVE Final    Comment:        The GeneXpert MRSA Assay (FDA approved for NASAL specimens only), is one component of a comprehensive MRSA colonization surveillance program. It is not intended to diagnose MRSA infection nor to guide or monitor treatment for MRSA infections. Performed at [facility], [address], [place], [place] [address]     Radiology Reports Ct Abdomen Pelvis Wo Contrast  Result Date: 08/16/2017 CLINICAL DATA:  Patient states he has abdominal pains that feel like pressure in nature. He states he has lost 50# in 2 weeks and taken weight tonight reflects that. Pt denies n/v/d. EXAM: CT ABDOMEN AND PELVIS WITHOUT CONTRAST TECHNIQUE: Multidetector CT imaging of the abdomen and pelvis was performed following the standard protocol without IV contrast. COMPARISON:  10/15/2016 FINDINGS: Lower chest: Clear lung bases.  Heart normal size. Hepatobiliary: No focal liver abnormality is seen. No gallstones, gallbladder wall thickening, or biliary dilatation. Pancreas: There is hazy opacity blurring the contour of the pancreas most evident along the pancreatic head, neck, body and proximal tail. There is subtle adjacent inflammatory type stranding. These findings are new from the prior CT. No pancreatic masses. No formed fluid collection to suggest an abscess or pseudocyst. Spleen: Normal in size without focal abnormality. Adrenals/Urinary Tract: 9 mm low-density mass arises from the lateral limb of the left adrenal gland consistent with an adenoma or nodular hyperplasia. Adrenals otherwise unremarkable. Kidneys normal size, orientation and position. No renal masses, stones or hydronephrosis. Normal ureters. Normal bladder. Stomach/Bowel: No bowel dilation to suggest obstruction. There is no bowel wall thickening or adjacent inflammation. Stomach is unremarkable. Normal appendix visualized.  Vascular/Lymphatic: Dense aortic atherosclerotic calcifications extending into the iliac vessels. No adenopathy. Reproductive: Unremarkable. Other: No abdominal wall hernia.  No ascites. Musculoskeletal: No fracture or acute finding. No osteoblastic or osteolytic lesions. IMPRESSION: 1. Mild pancreatitis. 2. No other acute abnormality. 3. Aortic atherosclerosis. Electronically Signed   By: Shanon Brow  Ormond M.D.   On: 08/16/2017 21:03     CBC Recent Labs  Lab 08/16/17 2011 08/17/17 0159 08/19/17 0400  WBC 5.4 5.7 2.8*  HGB 15.6 13.8 12.2*  HCT 47.5 41.5 36.6*  PLT 160 170 153  MCV 100.1* 98.0 97.8  MCH 32.8 32.5 32.5  MCHC 32.7 33.2 33.2  RDW 12.6 12.3 12.3    Chemistries  Recent Labs  Lab 08/16/17 2044 08/17/17 0159 08/17/17 0556 08/17/17 1014 08/18/17 0304 08/19/17 0400  NA 132* 136 147* 137 135 138  K 5.7* 3.8 5.0 3.8 3.8 3.2*  CL 98* 106 112* 108 108 112*  CO2 14* 17* 21* 21* 20* 21*  GLUCOSE 646* 419* 75 182* 317* 48*  BUN 27* 28* 27* 28* 29* 27*  CREATININE 2.19* 1.85* 1.87* 1.67* 1.52* 1.02  CALCIUM 9.8 9.0 10.4* 8.9 8.9 8.3*  MG  --   --   --  2.1  --   --   AST 16  --   --   --   --   --   ALT 16*  --   --   --   --   --   ALKPHOS 106  --   --   --   --   --   BILITOT 1.3*  --   --   --   --   --    ------------------------------------------------------------------------------------------------------------------ estimated creatinine clearance is 63.9 mL/min (by C-G formula based on SCr of 1.02 mg/dL). ------------------------------------------------------------------------------------------------------------------ Recent Labs    08/17/17 1014  HGBA1C 12.4*   ------------------------------------------------------------------------------------------------------------------ Recent Labs    08/17/17 1014  CHOL 172  HDL 47  LDLCALC 104*  TRIG 107  CHOLHDL 3.7    ------------------------------------------------------------------------------------------------------------------ No results for input(s): TSH, T4TOTAL, T3FREE, THYROIDAB in the last 72 hours.  Invalid input(s): FREET3 ------------------------------------------------------------------------------------------------------------------ No results for input(s): VITAMINB12, FOLATE, FERRITIN, TIBC, IRON, RETICCTPCT in the last 72 hours.  Coagulation profile No results for input(s): INR, PROTIME in the last 168 hours.  No results for input(s): DDIMER in the last 72 hours.  Cardiac Enzymes Recent Labs  Lab 08/16/17 2044  TROPONINI <0.03   ------------------------------------------------------------------------------------------------------------------ Invalid input(s): POCBNP    Assessment & Plan  Patient is a 57 year old with history of alcohol abuse and diabetes  #1  DKA (diabetic ketoacidoses) ([place]) -resolved, will adjust patient's insulin based on his blood sugars #2 Acute renal failure superimposed on stage 3 chronic kidney disease ([place]) improved with IV hydration #3  Pancreatitis, acute -due to alcohol abuse CT scan shows no evidence of gallstone, low-fat diet #4  Alcohol abuse -continue CIWA protocol #5 HTN (hypertension) -continue home meds #6  GERD (gastroesophageal reflux disease) -home dose PPI #7 HLD (hyperlipidemia) -home dose antilipid       Code Status Orders  (From admission, onward)        Start     Ordered   08/17/17 0000  Full code  Continuous     08/16/17 2359    Code Status History    Date Active Date Inactive Code Status Order ID Comments User Context   06/29/2016 2124 07/01/2016 1805 Full Code [id number]  Vaughan Basta, MD Inpatient           Consults none  DVT Prophylaxis heparin  Lab Results  Component Value Date   PLT 153 08/19/2017     Time Spent in minutes   35 minutes greater than 50% of time spent in care coordination  and counseling patient regarding the condition and plan  of care.   Dustin Flock M.D on 08/19/2017 at 2:38 PM  Between 7am to 6pm - Pager - [phone]  After 6pm go to [email or web address] - Proofreader  [employer]   Office  [phone]

## 2017-08-19 NOTE — Progress Notes (Addendum)
Inpatient Diabetes Program Recommendations  AACE/ADA: New Consensus Statement on Inpatient Glycemic Control (2015)  Target Ranges:  Prepandial:   less than 140 mg/dL      Peak postprandial:   less than 180 mg/dL (1-2 hours)      Critically ill patients:  140 - 180 mg/dL  Results for Reginald, Baker (MRN [id number]) as of 08/19/2017 09:13  Ref. Range 08/18/2017 08:30 08/18/2017 12:46 08/18/2017 17:25 08/18/2017 22:08 08/19/2017 08:00  Glucose-Capillary Latest Ref Range: 65 - 99 mg/dL 476 (H) 136 (H) 70 206 (H) 47 (L)   Results for Reginald, Baker (MRN [id number]) as of 08/19/2017 09:13  Ref. Range 08/17/2017 01:59 08/17/2017 05:56 08/17/2017 10:14 08/18/2017 03:04 08/19/2017 04:00  Glucose Latest Ref Range: 65 - 99 mg/dL 419 (H) 75 182 (H) 317 (H) 48 (L)  Hemoglobin A1C Latest Ref Range: 4.8 - 5.6 %   12.4 (H)     Review of Glycemic Control  Diabetes history: DM2 Outpatient Diabetes medications: Amaryl 4 mg daily Current orders for Inpatient glycemic control: Lantus 15 units daily, Novolog 0-9 units TID with meals, Novolog 0-5 units QHS  Inpatient Diabetes Program Recommendations: Insulin-Meal Coverage: Please consider ordering Novolog 4 units TID with meals for meal coverage if patient eats at least 50% of meals. Insulin - Basal: Noted patient received Lantus 25 units on 08/18/17 and fasting glucose 47 mg/dl this morning. Lantus was decreased from 25 to 15 units daily today. HgbA1C: A1C 12.4% on 08/17/17 indicating an average glucose of 309 mg/dl over the past 2-3 months. Anticipate patient will require insulin as an outpatient and recommend patient establish care with an Endocrinologist.   NOTE: Spoke with patient about diabetes and home regimen for diabetes control. Patient reports that he is followed by PCP for diabetes management but he has not seen PCP in a long time (last office visit in chart was May 2018). Patient states that he plans to find a new PCP and has some family that goes to Dr. Doy Hutching so he plans to  see if he can establish care with Dr. Doy Hutching.  Patient reports that he does have Amarly at home and has been taking it consistently.  Patient has not checked glucose in a while because he does not have a working glucometer or testing supplies.  Inquired about prior A1C and patient reports thatl his last A1Cs were 6.9-7.2% but does not remember how long ago that was.  Discussed A1C results (12.4% on 08/17/17) and explained that his current A1C indicates an average glucose of 309 mg/dl over the past 2-3 months. Discussed glucose and A1C goals. Discussed importance of checking CBGs and maintaining good CBG control to prevent long-term and short-term complications. Explained how hyperglycemia leads to damage within blood vessels which lead to the common complications seen with uncontrolled diabetes. Stressed to the patient the importance of improving glycemic control to prevent further complications from uncontrolled diabetes. Discussed impact of nutrition, exercise, stress, sickness, and medications on diabetes control.Discussed how pancreatitis can impact glycemic control. RN has given patient Living Well with DM book; encouraged patient to read through entire book. Inquired about possibility to using insulin at home and patient states that he is open to using insulin and has used insulin vial/syringe in the past (at least 2 years ago).Reviewed and demonstrated proper technique on how to draw up insulin with vial and syringe. Patient demonstrated competency with drawing up insulin by vial and syringe. Informed patient that RNs will continue working with patient on insulin injections  and allow patient to administer own insulin injections while inpatient (in case he is discharged on insulin).  Encouraged patient to check his glucose as MD recommends and to keep a log book of glucose readings. Informed patient that with pancreatitis, DM medications may need to be adjustments if glucose is running consistently high or  low.  Encouraged patient to ask PCP about being referred to an Endocrinologist if glucose remains uncontrolled. Patient verbalized understanding of information discussed and he states that he has no further questions at this time related to diabetes. At time of discharge, patient will need Rx for: glucometer, testing supplies, and if discharged on insulin will need insulin vial(s) and syringes.   Thanks, Barnie Alderman, RN, MSN, CDE Diabetes Coordinator Inpatient Diabetes Program [phone] (Team Pager from 8am to 5pm)

## 2017-08-19 NOTE — Care Management Important Message (Signed)
Important Message  Patient Details  Name: [name] MRN: [id number] Date of Birth: [birth year]   Medicare Important Message Given:  Yes    [name] A [name] 08/19/2017, 10:45 AM

## 2017-08-20 LAB — GLUCOSE, CAPILLARY
Glucose-Capillary: 532 mg/dL (ref 65–99)
Glucose-Capillary: 259 mg/dL — ABNORMAL HIGH (ref 65–99)
Glucose-Capillary: 190 mg/dL — ABNORMAL HIGH (ref 65–99)
Glucose-Capillary: 296 mg/dL — ABNORMAL HIGH (ref 65–99)

## 2017-08-20 LAB — BASIC METABOLIC PANEL
Anion gap: 6 (ref 5–15)
BUN: 20 mg/dL (ref 6–20)
CO2: 21 mmol/L — ABNORMAL LOW (ref 22–32)
Calcium: 8.7 mg/dL — ABNORMAL LOW (ref 8.9–10.3)
Chloride: 107 mmol/L (ref 101–111)
Creatinine, Ser: 1.25 mg/dL — ABNORMAL HIGH (ref 0.61–1.24)
GFR calc Af Amer: >60 mL/min (ref >60)
GFR calc non Af Amer: >60 mL/min (ref >60)
Glucose, Bld: 482 mg/dL — ABNORMAL HIGH (ref 65–99)
Potassium: 4.3 mmol/L (ref 3.5–5.1)
Sodium: 134 mmol/L — ABNORMAL LOW (ref 135–145)

## 2017-08-20 MED ORDER — INSULIN ASPART 100 UNIT/ML ~~LOC~~ SOLN
20.0000 [IU] | Freq: Once | SUBCUTANEOUS | Status: AC
  Administered 2017-08-20: 20 [IU] via SUBCUTANEOUS
  Filled 2017-08-20: qty 1

## 2017-08-20 MED ORDER — METFORMIN HCL ER 500 MG PO TB24
500.0000 mg | ORAL_TABLET | Freq: Every day | ORAL | Status: DC
  Administered 2017-08-21: 500 mg via ORAL
  Filled 2017-08-20: qty 1

## 2017-08-20 MED ORDER — PREMIER PROTEIN SHAKE: 11.0000 [oz_av] | Freq: Two times a day (BID) | ORAL | Status: DC

## 2017-08-20 MED ORDER — INSULIN GLARGINE 100 UNIT/ML ~~LOC~~ SOLN
20.0000 [IU] | Freq: Every day | SUBCUTANEOUS | Status: DC
  Administered 2017-08-21: 20 [IU] via SUBCUTANEOUS
  Filled 2017-08-20: qty 0.2

## 2017-08-20 NOTE — Progress Notes (Addendum)
Inpatient Diabetes Program Recommendations  AACE/ADA: New Consensus Statement on Inpatient Glycemic Control (2015)  Target Ranges:  Prepandial:   less than 140 mg/dL      Peak postprandial:   less than 180 mg/dL (1-2 hours)      Critically ill patients:  140 - 180 mg/dL   Results for JAIYON, WANDER (MRN [id number]) as of 08/20/2017 08:56  Ref. Range 08/19/2017 08:00 08/19/2017 08:28 08/19/2017 09:15 08/19/2017 12:19 08/19/2017 16:31 08/19/2017 21:01 08/20/2017 07:43  Glucose-Capillary Latest Ref Range: 65 - 99 mg/dL 47 (L) 58 (L) 158 (H) 432 (H) 161 (H) 162 (H) 532 (HH)   Review of Glycemic Control Diabetes history: DM2 Outpatient Diabetes medications: Amaryl 4 mg daily Current orders for Inpatient glycemic control: Lantus 15 units daily, Novolog 0-9 units TID with meals, Novolog 0-5 units QHS  Inpatient Diabetes Program Recommendations: Insulin-Correction: Please consider changing frequency of CBGs and Novolog correction to Q4H to allow more frequent monitoring and correction of glucose. Insulin-Meal Coverage: Please consider ordering Novolog 4 units TID with meals for meal coverage if patient eats at least 50% of meals. Insulin - Basal: Noted patient received Lantus 25 units on 08/18/17 and fasting glucose 47 mg/dl on 08/19/17. Lantus was decreased from 25 to 15 units daily on 08/19/17. Fasting glucose 532 mg/dl this morning. Please consider increasing Lantus to 20 units daily. HgbA1C: A1C 12.4% on 08/17/17 indicating an average glucose of 309 mg/dl over the past 2-3 months. Anticipate patient will require insulin as an outpatient and recommend patient establish care with an Endocrinologist. Patient is open to taking insulin outpatient (insulin vial/syringe). Consult: If glucose continues to fluctuate from lows to highs, attending MD may want to call Endocrinologist to discuss patient and ask for recommendations. Please note that Endocrinologist is no longer seeing patients inpatient but Dr. Gabriel Carina is open to  discussing patients with attending MD for recommendations if needed.  Thanks, Barnie Alderman, RN, MSN, CDE Diabetes Coordinator Inpatient Diabetes Program [phone] (Team Pager from 8am to 5pm)

## 2017-08-20 NOTE — Progress Notes (Signed)
Pts BG 532. MD Posey Pronto notified. Verbal orders received for Novolog 20 units once. Order placed.

## 2017-08-20 NOTE — Progress Notes (Signed)
Nutrition Follow-up  DOCUMENTATION CODES:   Severe malnutrition in context of chronic illness, Underweight  INTERVENTION:  RD provided "Carbohydrate Counting for People with Diabetes" handout from the Academy of [employer] and Dietetics. Discussed different food groups and their effects on blood sugar, emphasizing carbohydrate-containing foods. Provided list of carbohydrates and recommended serving sizes of common foods.Discussed importance of controlled and consistent carbohydrate intake throughout the day. Provided examples of ways to balance meals/snacks and encouraged intake of high-fiber, whole grain complex carbohydrates. Teach back method used.  Also reviewed "High Protein Food List" handout from the Academy of [employer] and Dietetics with patient. Encouraged adequate intake of protein from meals and oral nutrition supplements. Reviewed patient's specific protein needs and how to meet them with high-protein food.  Provide Premier Protein po BID, each supplement provides 160 kcal and 30 grams of protein.  Continue MVI daily, folic acid 1 mg daily, thiamine 100 mg daily.  NUTRITION DIAGNOSIS:   Severe Malnutrition related to chronic illness(CKD, EtOH abuse) as evidenced by severe fat depletion, moderate muscle depletion, severe muscle depletion.  Ongoing.  GOAL:   Patient will meet greater than or equal to 90% of their needs  Progressing.  MONITOR:   PO intake, Supplement acceptance, Diet advancement, Labs, Weight trends, I & O's  REASON FOR ASSESSMENT:   Malnutrition Screening Tool, Consult Assessment of nutrition requirement/status, Diet education  ASSESSMENT:   57 year old male with PMHx of DM type 2, HTN, CKD, HLD, EtOH abuse, hx CVA who was admitted with DKA, AKI on CKD, acute pancreatitis in setting of EtOH abuse.   -Patient was advanced to carbohydrate-modified diet on 4/8 after he was requesting solid food.  RD was consulted for diet education regarding  diabetes.  Met with patient at bedside. He reports he is tolerating his carbohydrate-modified diet well. He denies any post-prandial nausea or abdominal pain. He reports he is eating about 75% of meals. Per chart he was finishing 100% of clear liquids yesterday and is eating about 75-80% of solid diet today. Patient reports he is drinking his Premier Protein BID and cannot drink a third. Patient very concerned about being able to afford his medication and having access to pick up medication. He is not able to drive and reports his brother-in-law charges him for rides.  Medications reviewed and include: folic acid 1 mg daily, Novolog 0-9 units TID, Novolog 0-5 units QHS, Lantus 20 units daily, metformin 500 mg daily, MVI daily, pantoprazole, thiamine 100 mg daily.  Labs reviewed: CBG 58-532 past 24 hrs (fluctuating significantly), Sodium 134, CO2 21, Serum Glucose 482, Creatinine 1.25.  No weight since admission to trend.  Discussed with RN. Care Management is aware that he is concerned about affording insulin. He has Medicare.  Diet Order:  Diet Carb Modified Fluid consistency: Thin; Room service appropriate? Yes  EDUCATION NEEDS:   Not appropriate for education at this time  Skin:  Skin Assessment: Reviewed RN Assessment  Last BM:  08/18/2017 - large type 4  Height:   Ht Readings from Last 1 Encounters:  08/17/17 6' (1.829 m)    Weight:   Wt Readings from Last 1 Encounters:  08/17/17 124 lb 9 oz (56.5 kg)    Ideal Body Weight:  80.9 kg  BMI:  Body mass index is 16.89 kg/m.  Estimated Nutritional Needs:   Kcal:  1860-2000 (MSJ x 1.3-1.4)  Protein:  85-100 grams (1.5-1.7 grams/kg)  Fluid:  1.7-2 L/day (30-35 mL/kg)  Willey Blade, MS, RD, LDN Office: [phone]  Pager: [phone] After Hours/Weekend Pager: [phone]

## 2017-08-20 NOTE — Progress Notes (Signed)
[place] at [facility]                                                                                                                                                                                  Patient Demographics   [name], is a 57 y.o. male, DOB - [birth year], KGM:[id number]  Admit date - 08/16/2017   Admitting Physician Lance Coon, MD  Outpatient Primary MD for the patient is [name], MD   LOS - 4  Subjective: Patient blood glucose are still very labile in the 500 range patient was eating ice cream and graham crackers He currently otherwise is asymptomatic  Review of Systems:   CONSTITUTIONAL: No documented fever. No fatigue, weakness. No weight gain, no weight loss.  EYES: No blurry or double vision.  ENT: No tinnitus. No postnasal drip. No redness of the oropharynx.  RESPIRATORY: No cough, no wheeze, no hemoptysis. No dyspnea.  CARDIOVASCULAR: No chest pain. No orthopnea. No palpitations. No syncope.  GASTROINTESTINAL: No nausea, no vomiting or diarrhea.  Resolved abdominal pain. No melena or hematochezia.  GENITOURINARY: No dysuria or hematuria.  ENDOCRINE: No polyuria or nocturia. No heat or cold intolerance.  HEMATOLOGY: No anemia. No bruising. No bleeding.  INTEGUMENTARY: No rashes. No lesions.  MUSCULOSKELETAL: No arthritis. No swelling. No gout.  NEUROLOGIC: No numbness, tingling, or ataxia. No seizure-type activity.  PSYCHIATRIC: No anxiety. No insomnia. No ADD.    Vitals:   Vitals:   08/20/17 0000 08/20/17 0358 08/20/17 0740 08/20/17 1143  BP: 135/83 (!) 122/94 (!) 145/93 118/79  Pulse: 82 79 84 83  Resp: 19 19 18 18   Temp: 98.4 F (36.9 C) 97.8 F (36.6 C) 98.6 F (37 C) 98.2 F (36.8 C)  TempSrc: Oral Oral Oral Oral  SpO2: 100% 100% 100% 100%  Weight:      Height:        Wt Readings from Last 3 Encounters:  08/17/17 56.5 kg (124 lb 9 oz)  07/02/17 79.4 kg (175 lb)  08/23/16 79.4 kg (175 lb)      Intake/Output Summary (Last 24 hours) at 08/20/2017 1349 Last data filed at 08/20/2017 1007 Gross per 24 hour  Intake 480 ml  Output 500 ml  Net -20 ml    Physical Exam:   GENERAL: Pleasant-appearing in no apparent distress.  HEAD, EYES, EARS, NOSE AND THROAT: Atraumatic, normocephalic. Extraocular muscles are intact. Pupils equal and reactive to light. Sclerae anicteric. No conjunctival injection. No oro-pharyngeal erythema.  NECK: Supple. There is no jugular venous distention. No bruits, no lymphadenopathy, no thyromegaly.  HEART: Regular rate and rhythm,. No murmurs, no rubs, no clicks.  LUNGS: Clear to  auscultation bilaterally. No rales or rhonchi. No wheezes.  ABDOMEN: Soft, flat, nontender, nondistended. Has good bowel sounds. No hepatosplenomegaly appreciated.  EXTREMITIES: No evidence of any cyanosis, clubbing, or peripheral edema.  +2 pedal and radial pulses bilaterally.  NEUROLOGIC: The patient is alert, awake, and oriented x3 with no focal motor or sensory deficits appreciated bilaterally.  SKIN: Moist and warm with no rashes appreciated.  Psych: Not anxious, depressed LN: No inguinal LN enlargement    Antibiotics   Anti-infectives (From admission, onward)   None      Medications   Scheduled Meds: . folic acid  1 mg Oral Daily  . heparin  5,000 Units Subcutaneous Q8H  . insulin aspart  0-5 Units Subcutaneous QHS  . insulin aspart  0-9 Units Subcutaneous TID WC  . insulin glargine  15 Units Subcutaneous Daily  . LORazepam  0-4 mg Intravenous Q12H  . multivitamin with minerals  1 tablet Oral Daily  . pantoprazole  40 mg Oral Daily  . protein supplement shake  11 oz Oral TID BM  . thiamine  100 mg Oral Daily   Or  . thiamine  100 mg Intravenous Daily   Continuous Infusions: . sodium chloride     PRN Meds:.HYDROmorphone (DILAUDID) injection   Data Review:   Micro Results Recent Results (from the past 240 hour(s))  MRSA PCR Screening     Status: None    Collection Time: 08/17/17 12:58 AM  Result Value Ref Range Status   MRSA by PCR NEGATIVE NEGATIVE Final    Comment:        The GeneXpert MRSA Assay (FDA approved for NASAL specimens only), is one component of a comprehensive MRSA colonization surveillance program. It is not intended to diagnose MRSA infection nor to guide or monitor treatment for MRSA infections. Performed at [facility], [address] [place], [place] [address]     Radiology Reports Ct Abdomen Pelvis Wo Contrast  Result Date: 08/16/2017 CLINICAL DATA:  Patient states he has abdominal pains that feel like pressure in nature. He states he has lost 50# in 2 weeks and taken weight tonight reflects that. Pt denies n/v/d. EXAM: CT ABDOMEN AND PELVIS WITHOUT CONTRAST TECHNIQUE: Multidetector CT imaging of the abdomen and pelvis was performed following the standard protocol without IV contrast. COMPARISON:  10/15/2016 FINDINGS: Lower chest: Clear lung bases.  Heart normal size. Hepatobiliary: No focal liver abnormality is seen. No gallstones, gallbladder wall thickening, or biliary dilatation. Pancreas: There is hazy opacity blurring the contour of the pancreas most evident along the pancreatic head, neck, body and proximal tail. There is subtle adjacent inflammatory type stranding. These findings are new from the prior CT. No pancreatic masses. No formed fluid collection to suggest an abscess or pseudocyst. Spleen: Normal in size without focal abnormality. Adrenals/Urinary Tract: 9 mm low-density mass arises from the lateral limb of the left adrenal gland consistent with an adenoma or nodular hyperplasia. Adrenals otherwise unremarkable. Kidneys normal size, orientation and position. No renal masses, stones or hydronephrosis. Normal ureters. Normal bladder. Stomach/Bowel: No bowel dilation to suggest obstruction. There is no bowel wall thickening or adjacent inflammation. Stomach is unremarkable. Normal appendix  visualized. Vascular/Lymphatic: Dense aortic atherosclerotic calcifications extending into the iliac vessels. No adenopathy. Reproductive: Unremarkable. Other: No abdominal wall hernia.  No ascites. Musculoskeletal: No fracture or acute finding. No osteoblastic or osteolytic lesions. IMPRESSION: 1. Mild pancreatitis. 2. No other acute abnormality. 3. Aortic atherosclerosis. Electronically Signed   By: Dedra Skeens.D.  On: 08/16/2017 21:03     CBC Recent Labs  Lab 08/16/17 2011 08/17/17 0159 08/19/17 0400  WBC 5.4 5.7 2.8*  HGB 15.6 13.8 12.2*  HCT 47.5 41.5 36.6*  PLT 160 170 153  MCV 100.1* 98.0 97.8  MCH 32.8 32.5 32.5  MCHC 32.7 33.2 33.2  RDW 12.6 12.3 12.3    Chemistries  Recent Labs  Lab 08/16/17 2044  08/17/17 0556 08/17/17 1014 08/18/17 0304 08/19/17 0400 08/20/17 0948  NA 132*   < > 147* 137 135 138 134*  K 5.7*   < > 5.0 3.8 3.8 3.2* 4.3  CL 98*   < > 112* 108 108 112* 107  CO2 14*   < > 21* 21* 20* 21* 21*  GLUCOSE 646*   < > 75 182* 317* 48* 482*  BUN 27*   < > 27* 28* 29* 27* 20  CREATININE 2.19*   < > 1.87* 1.67* 1.52* 1.02 1.25*  CALCIUM 9.8   < > 10.4* 8.9 8.9 8.3* 8.7*  MG  --   --   --  2.1  --   --   --   AST 16  --   --   --   --   --   --   ALT 16*  --   --   --   --   --   --   ALKPHOS 106  --   --   --   --   --   --   BILITOT 1.3*  --   --   --   --   --   --    < > = values in this interval not displayed.   ------------------------------------------------------------------------------------------------------------------ estimated creatinine clearance is 52.1 mL/min (A) (by C-G formula based on SCr of 1.25 mg/dL (H)). ------------------------------------------------------------------------------------------------------------------ No results for input(s): HGBA1C in the last 72 hours. ------------------------------------------------------------------------------------------------------------------ No results for input(s): CHOL, HDL, LDLCALC,  TRIG, CHOLHDL, LDLDIRECT in the last 72 hours. ------------------------------------------------------------------------------------------------------------------ No results for input(s): TSH, T4TOTAL, T3FREE, THYROIDAB in the last 72 hours.  Invalid input(s): FREET3 ------------------------------------------------------------------------------------------------------------------ No results for input(s): VITAMINB12, FOLATE, FERRITIN, TIBC, IRON, RETICCTPCT in the last 72 hours.  Coagulation profile No results for input(s): INR, PROTIME in the last 168 hours.  No results for input(s): DDIMER in the last 72 hours.  Cardiac Enzymes Recent Labs  Lab 08/16/17 2044  TROPONINI <0.03   ------------------------------------------------------------------------------------------------------------------ Invalid input(s): POCBNP    Assessment & Plan  Patient is a 57 year old with history of alcohol abuse and diabetes  #1  DKA (diabetic ketoacidoses) ([place]) -resolved, blood sugar still continue to be high will increase his Lantus continue sliding scale  #2 Acute renal failure superimposed on stage 3 chronic kidney disease ([place]) improved with IV hydration #3  Pancreatitis, acute -due to alcohol abuse CT scan shows no evidence of gallstone, low-fat diet now resolved #4  Alcohol abuse -continue CIWA protocol, no evidence of withdrawal #5 HTN (hypertension) -continue home meds #6  GERD (gastroesophageal reflux disease) -home dose PPI #7 HLD (hyperlipidemia) -home dose antilipid       Code Status Orders  (From admission, onward)        Start     Ordered   08/17/17 0000  Full code  Continuous     08/16/17 2359    Code Status History    Date Active Date Inactive Code Status Order ID Comments User Context   06/29/2016 2124 07/01/2016 1805 Full Code [id number]  Vaughan Basta, MD Inpatient  Consults none  DVT Prophylaxis heparin  Lab Results  Component Value Date    PLT 153 08/19/2017     Time Spent in minutes   35 minutes greater than 50% of time spent in care coordination and counseling patient regarding the condition and plan of care.   Dustin Flock M.D on 08/20/2017 at 1:49 PM  Between 7am to 6pm - Pager - [phone]  After 6pm go to [email or web address] - Proofreader  [employer]   Office  [phone]

## 2017-08-21 LAB — BASIC METABOLIC PANEL
Anion gap: 6 (ref 5–15)
BUN: 22 mg/dL — ABNORMAL HIGH (ref 6–20)
CO2: 24 mmol/L (ref 22–32)
Calcium: 9.2 mg/dL (ref 8.9–10.3)
Chloride: 107 mmol/L (ref 101–111)
Creatinine, Ser: 1.08 mg/dL (ref 0.61–1.24)
GFR calc Af Amer: >60 mL/min (ref >60)
GFR calc non Af Amer: >60 mL/min (ref >60)
Glucose, Bld: 182 mg/dL — ABNORMAL HIGH (ref 65–99)
Potassium: 4.2 mmol/L (ref 3.5–5.1)
Sodium: 137 mmol/L (ref 135–145)

## 2017-08-21 LAB — GLUCOSE, CAPILLARY: Glucose-Capillary: 181 mg/dL — ABNORMAL HIGH (ref 65–99)

## 2017-08-21 MED ORDER — INSULIN GLARGINE 100 UNIT/ML ~~LOC~~ SOLN: 20.0000 [IU] | Freq: Every day | SUBCUTANEOUS | 11 refills | Status: DC

## 2017-08-21 MED ORDER — METFORMIN HCL ER 500 MG PO TB24: 500.0000 mg | ORAL_TABLET | Freq: Every day | ORAL | 2 refills | Status: DC

## 2017-08-21 NOTE — Discharge Planning (Signed)
Dr. Text to inform of patient's STRONG wishes to leave today - even if needs to go AMA. Dr. will round when able.

## 2017-08-21 NOTE — Discharge Planning (Signed)
Patient IV x2 removed.  RN assessment and VS revealed stability for DC to home w/ wife.  Discharge papers given, explained and educated. Informed of suggested FU appt and appt made.  Paper scripts for Metformin, Lantus, syringes, strips and glucometer given patient (to be filled at [place], [employer] with current insurance.  Still patient states he cannot afford copays.  CM consulted and discussed patient options. Patient educated that re-admission would occur if he was unable to use medications as prescribed. Once ready, will be wheeled to front and family transporting home via car.

## 2017-08-21 NOTE — Care Management (Signed)
Met with patient at bedside. He states he has medication cover age under his medicare. He has has income that he would not disclose to [facility] but states" its al spent". There is none left to pay for medications per the patient. I explained to him that he could buy a [employer] glucometer at [facility] for less than $20 and he said he couldn't afford it. Patient drinks alcohol and smokes. RNCM told him she had no additional resources for him. I encouraged him to get his glucometer and check his blood sugars as prescribed to prevent rehospitalization. Attending updated on RNCM conversation with patient

## 2017-08-21 NOTE — Discharge Summary (Signed)
[facility] [place] [facility], 57 y.o., DOB 1960/07/06, MRN [id number]. Admission date: 08/16/2017 Discharge Date 08/21/2017 Primary MD [name], MD Admitting Physician Lance Coon, MD  Admission Diagnosis  Acute renal failure, unspecified acute renal failure type [facility]) [N17.9] Diabetic ketoacidosis without coma associated with other specified diabetes mellitus ([place]) [E13.10] Alcohol-induced acute pancreatitis, unspecified complication status [U20.25]  Discharge Diagnosis   Principal Problem:  DKA (diabetic ketoacidoses) ([place]) Acute renal failure superimposed on stage 3 chronic kidney disease ([place]) Acute pancreatitis Alcohol abuse GERD (gastroesophageal reflux disease) HTN (hypertension) HLD (hyperlipidemia) Protein-calorie malnutrition, severe Medical noncompliance        Hospital Course  Patient is a 57 year old African-American male presented with abdominal pain and was noted to be in DKA as well as it had acute pancreatitis.  Patient was noncompliant with his medications.  He was treated with IV fluids insulin drip and supportive care.  With these treatments his symptoms started to improve.  His abdominal pain resolved he was started on a diet.  In terms of his blood sugars he was on oral medications as outpatient however patient has been noncompliant with these.  She was started on Lantus with improvement in her his blood sugars.  Patient does have medical insurance and was told to get his medications filled.  And strongly recommend he stop drinking.            Consults  None  Significant Tests:  See full reports for all details     Ct Abdomen Pelvis Wo Contrast  Result Date: 08/16/2017 CLINICAL DATA:  Patient states he has abdominal pains that feel like pressure in nature. He states he has lost 50# in 2 weeks and taken weight tonight reflects that. Pt denies n/v/d. EXAM: CT ABDOMEN AND PELVIS WITHOUT CONTRAST TECHNIQUE:  Multidetector CT imaging of the abdomen and pelvis was performed following the standard protocol without IV contrast. COMPARISON:  10/15/2016 FINDINGS: Lower chest: Clear lung bases.  Heart normal size. Hepatobiliary: No focal liver abnormality is seen. No gallstones, gallbladder wall thickening, or biliary dilatation. Pancreas: There is hazy opacity blurring the contour of the pancreas most evident along the pancreatic head, neck, body and proximal tail. There is subtle adjacent inflammatory type stranding. These findings are new from the prior CT. No pancreatic masses. No formed fluid collection to suggest an abscess or pseudocyst. Spleen: Normal in size without focal abnormality. Adrenals/Urinary Tract: 9 mm low-density mass arises from the lateral limb of the left adrenal gland consistent with an adenoma or nodular hyperplasia. Adrenals otherwise unremarkable. Kidneys normal size, orientation and position. No renal masses, stones or hydronephrosis. Normal ureters. Normal bladder. Stomach/Bowel: No bowel dilation to suggest obstruction. There is no bowel wall thickening or adjacent inflammation. Stomach is unremarkable. Normal appendix visualized. Vascular/Lymphatic: Dense aortic atherosclerotic calcifications extending into the iliac vessels. No adenopathy. Reproductive: Unremarkable. Other: No abdominal wall hernia.  No ascites. Musculoskeletal: No fracture or acute finding. No osteoblastic or osteolytic lesions. IMPRESSION: 1. Mild pancreatitis. 2. No other acute abnormality. 3. Aortic atherosclerosis. Electronically Signed   By: Lajean Manes M.D.   On: 08/16/2017 21:03       Today   Subjective:   [name] patient feeling well denies any complaints Objective:   Blood pressure (!) 142/86, pulse 74, temperature 98.1 F (36.7 C), temperature source Oral, resp. rate 18, height 6' (1.829 m), weight 56.5 kg (124 lb 9 oz), SpO2 100 %.  .  Intake/Output Summary (Last 24  hours) at 08/21/2017  1327 Last data filed at 08/21/2017 0936 Gross per 24 hour  Intake 720 ml  Output 600 ml  Net 120 ml    Exam VITAL SIGNS: Blood pressure (!) 142/86, pulse 74, temperature 98.1 F (36.7 C), temperature source Oral, resp. rate 18, height 6' (1.829 m), weight 56.5 kg (124 lb 9 oz), SpO2 100 %.  GENERAL:  57 y.o.-year-old patient lying in the bed with no acute distress.  EYES: Pupils equal, round, reactive to light and accommodation. No scleral icterus. Extraocular muscles intact.  HEENT: Head atraumatic, normocephalic. Oropharynx and nasopharynx clear.  NECK:  Supple, no jugular venous distention. No thyroid enlargement, no tenderness.  LUNGS: Normal breath sounds bilaterally, no wheezing, rales,rhonchi or crepitation. No use of accessory muscles of respiration.  CARDIOVASCULAR: S1, S2 normal. No murmurs, rubs, or gallops.  ABDOMEN: Soft, nontender, nondistended. Bowel sounds present. No organomegaly or mass.  EXTREMITIES: No pedal edema, cyanosis, or clubbing.  NEUROLOGIC: Cranial nerves II through XII are intact. Muscle strength 5/5 in all extremities. Sensation intact. Gait not checked.  PSYCHIATRIC: The patient is alert and oriented x 3.  SKIN: No obvious rash, lesion, or ulcer.   Data Review     CBC w Diff:  Lab Results  Component Value Date   WBC 2.8 (L) 08/19/2017   HGB 12.2 (L) 08/19/2017   HGB 13.1 02/12/2014   HCT 36.6 (L) 08/19/2017   HCT 41.7 02/12/2014   PLT 153 08/19/2017   PLT 322 02/12/2014   LYMPHOPCT 23 07/01/2016   LYMPHOPCT 19.8 02/12/2014   MONOPCT 10 07/01/2016   MONOPCT 8.7 02/12/2014   EOSPCT 2 07/01/2016   EOSPCT 1.8 02/12/2014   BASOPCT 0 07/01/2016   BASOPCT 0.9 02/12/2014   CMP:  Lab Results  Component Value Date   NA 137 08/21/2017   NA 143 01/20/2014   K 4.2 08/21/2017   K 4.6 01/20/2014   CL 107 08/21/2017   CL 116 (H) 01/20/2014   CO2 24 08/21/2017   CO2 22 01/20/2014   BUN 22 (H) 08/21/2017   BUN 18 01/20/2014   CREATININE 1.08  08/21/2017   CREATININE 1.32 (H) 01/20/2014   PROT 8.4 (H) 08/16/2017   PROT 5.5 (L) 01/19/2014   ALBUMIN 3.7 08/16/2017   ALBUMIN 2.4 (L) 01/19/2014   BILITOT 1.3 (H) 08/16/2017   BILITOT 0.2 01/19/2014   ALKPHOS 106 08/16/2017   ALKPHOS 39 (L) 01/19/2014   AST 16 08/16/2017   AST 43 (H) 01/19/2014   ALT 16 (L) 08/16/2017   ALT 43 01/19/2014  .  Micro Results Recent Results (from the past 240 hour(s))  MRSA PCR Screening     Status: None   Collection Time: 08/17/17 12:58 AM  Result Value Ref Range Status   MRSA by PCR NEGATIVE NEGATIVE Final    Comment:        The GeneXpert MRSA Assay (FDA approved for NASAL specimens only), is one component of a comprehensive MRSA colonization surveillance program. It is not intended to diagnose MRSA infection nor to guide or monitor treatment for MRSA infections. Performed at [facility], [address] [place], [place] [address]         Code Status Orders  (From admission, onward)        Start     Ordered   08/17/17 0000  Full code  Continuous     08/16/17 2359    Code Status History    Date Active Date Inactive Code Status  Order ID Comments User Context   06/29/2016 2124 07/01/2016 1805 Full Code [id number]  Vaughan Basta, MD Inpatient          Follow-up Information    [name], MD Follow up on 08/27/2017.   Specialty:  Internal Medicine Why:  11:00 am Contact information: [place] [address] [phone]           Discharge Medications   Allergies as of 08/21/2017   No Known Allergies     Medication List    STOP taking these medications   cephALEXin 500 MG capsule Commonly known as:  KEFLEX   docusate sodium 100 MG capsule Commonly known as:  COLACE   ferrous gluconate 324 MG tablet Commonly known as:  FERGON   fluconazole 100 MG tablet Commonly known as:  DIFLUCAN   folic acid 1 MG tablet Commonly known as:   FOLVITE   glimepiride 4 MG tablet Commonly known as:  AMARYL   lisinopril 20 MG tablet Commonly known as:  PRINIVIL,ZESTRIL   nicotine 14 mg/24hr patch Commonly known as:  NICODERM CQ - dosed in mg/24 hours   ondansetron 4 MG disintegrating tablet Commonly known as:  ZOFRAN ODT   oxyCODONE-acetaminophen 5-325 MG tablet Commonly known as:  ROXICET   pantoprazole 40 MG tablet Commonly known as:  PROTONIX   sucralfate 1 g tablet Commonly known as:  CARAFATE   thiamine 100 MG tablet     TAKE these medications   insulin glargine 100 UNIT/ML injection Commonly known as:  LANTUS Inject 0.2 mLs (20 Units total) into the skin daily. Start taking on:  08/22/2017   metFORMIN 500 MG 24 hr tablet Commonly known as:  GLUCOPHAGE-XR Take 1 tablet (500 mg total) by mouth daily with breakfast. Start taking on:  08/22/2017   multivitamin with minerals Tabs tablet Take 1 tablet by mouth daily.          Total Time in preparing paper work, data evaluation and todays exam - 64 minutes  Dustin Flock M.D on 08/21/2017 at [place]  [phone]

## 2017-08-21 NOTE — Progress Notes (Signed)
[place] [facility]                                                                                                                                                                                  Patient Demographics   [name], is a 57 y.o. male, DOB - 03-08-1961, RXV:[id number]  Admit date - 08/16/2017   Admitting Physician Lance Coon, MD  Outpatient Primary MD for the patient is [name], MD   LOS - 5  Subjective: Blood glucose still labilie   Review of Systems:   CONSTITUTIONAL: No documented fever. No fatigue, weakness. No weight gain, no weight loss.  EYES: No blurry or double vision.  ENT: No tinnitus. No postnasal drip. No redness of the oropharynx.  RESPIRATORY: No cough, no wheeze, no hemoptysis. No dyspnea.  CARDIOVASCULAR: No chest pain. No orthopnea. No palpitations. No syncope.  GASTROINTESTINAL: No nausea, no vomiting or diarrhea.  Resolved abdominal pain. No melena or hematochezia.  GENITOURINARY: No dysuria or hematuria.  ENDOCRINE: No polyuria or nocturia. No heat or cold intolerance.  HEMATOLOGY: No anemia. No bruising. No bleeding.  INTEGUMENTARY: No rashes. No lesions.  MUSCULOSKELETAL: No arthritis. No swelling. No gout.  NEUROLOGIC: No numbness, tingling, or ataxia. No seizure-type activity.  PSYCHIATRIC: No anxiety. No insomnia. No ADD.    Vitals:   Vitals:   08/21/17 0348 08/21/17 0349 08/21/17 0745 08/21/17 0746  BP: (!) 140/100 (!) 135/95  (!) 142/86  Pulse: 81 77  74  Resp: 18   18  Temp: 98.3 F (36.8 C)  98.1 F (36.7 C)   TempSrc: Oral  Oral   SpO2: 97%   100%  Weight:      Height:        Wt Readings from Last 3 Encounters:  08/17/17 56.5 kg (124 lb 9 oz)  07/02/17 79.4 kg (175 lb)  08/23/16 79.4 kg (175 lb)     Intake/Output Summary (Last 24 hours) at 08/21/2017 0910 Last data filed at 08/20/2017 2120 Gross per 24 hour  Intake 720 ml  Output 600 ml  Net 120 ml    Physical Exam:    GENERAL: Pleasant-appearing in no apparent distress.  HEAD, EYES, EARS, NOSE AND THROAT: Atraumatic, normocephalic. Extraocular muscles are intact. Pupils equal and reactive to light. Sclerae anicteric. No conjunctival injection. No oro-pharyngeal erythema.  NECK: Supple. There is no jugular venous distention. No bruits, no lymphadenopathy, no thyromegaly.  HEART: Regular rate and rhythm,. No murmurs, no rubs, no clicks.  LUNGS: Clear to auscultation bilaterally. No rales or rhonchi. No wheezes.  ABDOMEN: Soft, flat, nontender, nondistended. Has good bowel sounds. No hepatosplenomegaly appreciated.  EXTREMITIES: No  evidence of any cyanosis, clubbing, or peripheral edema.  +2 pedal and radial pulses bilaterally.  NEUROLOGIC: The patient is alert, awake, and oriented x3 with no focal motor or sensory deficits appreciated bilaterally.  SKIN: Moist and warm with no rashes appreciated.  Psych: Not anxious, depressed LN: No inguinal LN enlargement    Antibiotics   Anti-infectives (From admission, onward)   None      Medications   Scheduled Meds: . folic acid  1 mg Oral Daily  . heparin  5,000 Units Subcutaneous Q8H  . insulin aspart  0-5 Units Subcutaneous QHS  . insulin aspart  0-9 Units Subcutaneous TID WC  . insulin glargine  20 Units Subcutaneous Daily  . metFORMIN  500 mg Oral Q breakfast  . multivitamin with minerals  1 tablet Oral Daily  . pantoprazole  40 mg Oral Daily  . protein supplement shake  11 oz Oral BID BM  . thiamine  100 mg Oral Daily   Or  . thiamine  100 mg Intravenous Daily   Continuous Infusions: . sodium chloride     PRN Meds:.HYDROmorphone (DILAUDID) injection   Data Review:   Micro Results Recent Results (from the past 240 hour(s))  MRSA PCR Screening     Status: None   Collection Time: 08/17/17 12:58 AM  Result Value Ref Range Status   MRSA by PCR NEGATIVE NEGATIVE Final    Comment:        The GeneXpert MRSA Assay (FDA approved for NASAL  specimens only), is one component of a comprehensive MRSA colonization surveillance program. It is not intended to diagnose MRSA infection nor to guide or monitor treatment for MRSA infections. Performed at [facility], [address], [place], [place] [address]     Radiology Reports Ct Abdomen Pelvis Wo Contrast  Result Date: 08/16/2017 CLINICAL DATA:  Patient states he has abdominal pains that feel like pressure in nature. He states he has lost 50# in 2 weeks and taken weight tonight reflects that. Pt denies n/v/d. EXAM: CT ABDOMEN AND PELVIS WITHOUT CONTRAST TECHNIQUE: Multidetector CT imaging of the abdomen and pelvis was performed following the standard protocol without IV contrast. COMPARISON:  10/15/2016 FINDINGS: Lower chest: Clear lung bases.  Heart normal size. Hepatobiliary: No focal liver abnormality is seen. No gallstones, gallbladder wall thickening, or biliary dilatation. Pancreas: There is hazy opacity blurring the contour of the pancreas most evident along the pancreatic head, neck, body and proximal tail. There is subtle adjacent inflammatory type stranding. These findings are new from the prior CT. No pancreatic masses. No formed fluid collection to suggest an abscess or pseudocyst. Spleen: Normal in size without focal abnormality. Adrenals/Urinary Tract: 9 mm low-density mass arises from the lateral limb of the left adrenal gland consistent with an adenoma or nodular hyperplasia. Adrenals otherwise unremarkable. Kidneys normal size, orientation and position. No renal masses, stones or hydronephrosis. Normal ureters. Normal bladder. Stomach/Bowel: No bowel dilation to suggest obstruction. There is no bowel wall thickening or adjacent inflammation. Stomach is unremarkable. Normal appendix visualized. Vascular/Lymphatic: Dense aortic atherosclerotic calcifications extending into the iliac vessels. No adenopathy. Reproductive: Unremarkable. Other: No abdominal wall hernia.   No ascites. Musculoskeletal: No fracture or acute finding. No osteoblastic or osteolytic lesions. IMPRESSION: 1. Mild pancreatitis. 2. No other acute abnormality. 3. Aortic atherosclerosis. Electronically Signed   By: Lajean Manes M.D.   On: 08/16/2017 21:03     CBC Recent Labs  Lab 08/16/17 2011 08/17/17 0159 08/19/17 0400  WBC 5.4 5.7  2.8*  HGB 15.6 13.8 12.2*  HCT 47.5 41.5 36.6*  PLT 160 170 153  MCV 100.1* 98.0 97.8  MCH 32.8 32.5 32.5  MCHC 32.7 33.2 33.2  RDW 12.6 12.3 12.3    Chemistries  Recent Labs  Lab 08/16/17 2044  08/17/17 1014 08/18/17 0304 08/19/17 0400 08/20/17 0948 08/21/17 0738  NA 132*   < > 137 135 138 134* 137  K 5.7*   < > 3.8 3.8 3.2* 4.3 4.2  CL 98*   < > 108 108 112* 107 107  CO2 14*   < > 21* 20* 21* 21* 24  GLUCOSE 646*   < > 182* 317* 48* 482* 182*  BUN 27*   < > 28* 29* 27* 20 22*  CREATININE 2.19*   < > 1.67* 1.52* 1.02 1.25* 1.08  CALCIUM 9.8   < > 8.9 8.9 8.3* 8.7* 9.2  MG  --   --  2.1  --   --   --   --   AST 16  --   --   --   --   --   --   ALT 16*  --   --   --   --   --   --   ALKPHOS 106  --   --   --   --   --   --   BILITOT 1.3*  --   --   --   --   --   --    < > = values in this interval not displayed.   ------------------------------------------------------------------------------------------------------------------ estimated creatinine clearance is 60.3 mL/min (by C-G formula based on SCr of 1.08 mg/dL). ------------------------------------------------------------------------------------------------------------------ No results for input(s): HGBA1C in the last 72 hours. ------------------------------------------------------------------------------------------------------------------ No results for input(s): CHOL, HDL, LDLCALC, TRIG, CHOLHDL, LDLDIRECT in the last 72 hours. ------------------------------------------------------------------------------------------------------------------ No results for input(s): TSH,  T4TOTAL, T3FREE, THYROIDAB in the last 72 hours.  Invalid input(s): FREET3 ------------------------------------------------------------------------------------------------------------------ No results for input(s): VITAMINB12, FOLATE, FERRITIN, TIBC, IRON, RETICCTPCT in the last 72 hours.  Coagulation profile No results for input(s): INR, PROTIME in the last 168 hours.  No results for input(s): DDIMER in the last 72 hours.  Cardiac Enzymes Recent Labs  Lab 08/16/17 2044  TROPONINI <0.03   ------------------------------------------------------------------------------------------------------------------ Invalid input(s): POCBNP    Assessment & Plan  Patient is a 57 year old with history of alcohol abuse and diabetes  #1  DKA (diabetic ketoacidoses) ([place]) -resolved, will adjust patient's insulin  #2 Acute renal failure superimposed on stage 3 chronic kidney disease ([place]) improved with IV hydration #3  Pancreatitis, acute -due to alcohol abuse CT scan shows no evidence of gallstone, low-fat diet #4  Alcohol abuse -continue CIWA protocol #5 HTN (hypertension) -continue home meds #6  GERD (gastroesophageal reflux disease) -home dose PPI #7 HLD (hyperlipidemia) -home dose antilipid       Code Status Orders  (From admission, onward)        Start     Ordered   08/17/17 0000  Full code  Continuous     08/16/17 2359    Code Status History    Date Active Date Inactive Code Status Order ID Comments User Context   06/29/2016 2124 07/01/2016 1805 Full Code [id number]  Vaughan Basta, MD Inpatient           Consults none  DVT Prophylaxis heparin  Lab Results  Component Value Date   PLT 153 08/19/2017     Time Spent in minutes   35 minutes  greater than 50% of time spent in care coordination and counseling patient regarding the condition and plan of care.   Dustin Flock M.D on 08/21/2017 at 9:10 AM  Between 7am to 6pm - Pager - [phone]  After 6pm go  to [email or web address] - Proofreader  [employer]   Office  [phone]

## 2017-08-21 NOTE — Progress Notes (Signed)
Inpatient Diabetes Program Recommendations  AACE/ADA: New Consensus Statement on Inpatient Glycemic Control (2015)  Target Ranges:  Prepandial:   less than 140 mg/dL      Peak postprandial:   less than 180 mg/dL (1-2 hours)      Critically ill patients:  140 - 180 mg/dL  Results for VERNIS, EID (MRN [id number]) as of 08/21/2017 07:44  Ref. Range 08/20/2017 07:43 08/20/2017 11:42 08/20/2017 16:53 08/20/2017 21:11 08/21/2017 07:36  Glucose-Capillary Latest Ref Range: 65 - 99 mg/dL 532 (HH)  Novolog 20 units  Lantus 15 units 259 (H)  Novolog 5 units 190 (H)  Novolog 2 units 296 (H)  Novolog 3 units 181 (H)    Review of Glycemic Control  Diabetes history:DM2 Outpatient Diabetes medications:Amaryl 4 mg daily Current orders for Inpatient glycemic control:Lantus 20 units daily, Novolog 0-9 units TID with meals, Novolog 0-5 units QHS, Metformin XR 500 mg QAM  Inpatient Diabetes Program Recommendations: Insulin-Basal: Noted Lantus was increased from 15 to 20 units to start today.  HgbA1C:A1C 12.4% on 4/6/19indicating an average glucose of 309 mg/dl over the past 2-3 months. Anticipate patient will require insulin as an outpatient and recommend patient establish care with an Endocrinologist.Patient is open to taking insulin outpatient (insulin vial/syringe).  Thanks, Barnie Alderman, RN, MSN, CDE Diabetes Coordinator Inpatient Diabetes Program [phone] (Team Pager from 8am to 5pm)

## 2017-08-26 ENCOUNTER — Telehealth: Payer: Self-pay

## 2017-08-26 NOTE — Telephone Encounter (Signed)
EMMI Follow-Up: [name] had received an automated call from my number so he was just returning the call. Explained our process post discharge and he stated he had no concerns at this time. Thanked him for his time.

## 2017-08-27 DIAGNOSIS — E119 Type 2 diabetes mellitus without complications: Secondary | ICD-10-CM | POA: Insufficient documentation

## 2017-08-27 DIAGNOSIS — Z794 Long term (current) use of insulin: Secondary | ICD-10-CM

## 2017-10-10 ENCOUNTER — Other Ambulatory Visit
Admission: RE | Admit: 2017-10-10 | Discharge: 2017-10-10 | Disposition: A | Discharge status: Home / Self Care | Attending: Family Medicine | Admitting: Family Medicine

## 2017-10-10 NOTE — ED Notes (Signed)
Patient ambulatory to triage with steady gait, without difficulty or distress noted, in custody of [place] [employer] officer Ottis Stain for forensic blood draw; pt A&Ox3, with no c/o voiced and denies need to see ED provider; pt voices good understanding of blood draw to be performed for forensic testing; pt verifies identity with name and DOB; using sealed kit provided by officer, tourniquet applied to left forearm; left forearml region prepped with betadine swab and allowed to dry completely; needle inserted and 2 grey top blood tubes collected; tourniquet removed, needle removed & intact, dressing applied; tubes labeled, given to officer and placed in sealed container using chain of custody; pt tolerated well and continues to deny c/o or need to see ED provider; pt d/c in police custody

## 2018-01-15 ENCOUNTER — Other Ambulatory Visit: Payer: Self-pay

## 2018-01-15 ENCOUNTER — Observation Stay
Admission: EM | Admit: 2018-01-15 | Discharge: 2018-01-16 | Disposition: A | Discharge status: Home / Self Care | Payer: Medicare Other | Attending: Internal Medicine | Admitting: Internal Medicine

## 2018-01-15 ENCOUNTER — Encounter: Payer: Self-pay | Admitting: Specialist

## 2018-01-15 DIAGNOSIS — E11649 Type 2 diabetes mellitus with hypoglycemia without coma: Principal | ICD-10-CM | POA: Insufficient documentation

## 2018-01-15 DIAGNOSIS — I129 Hypertensive chronic kidney disease with stage 1 through stage 4 chronic kidney disease, or unspecified chronic kidney disease: Secondary | ICD-10-CM | POA: Insufficient documentation

## 2018-01-15 DIAGNOSIS — N183 Chronic kidney disease, stage 3 (moderate): Secondary | ICD-10-CM | POA: Insufficient documentation

## 2018-01-15 DIAGNOSIS — Z9119 Patient's noncompliance with other medical treatment and regimen: Secondary | ICD-10-CM | POA: Insufficient documentation

## 2018-01-15 DIAGNOSIS — G9341 Metabolic encephalopathy: Secondary | ICD-10-CM | POA: Insufficient documentation

## 2018-01-15 DIAGNOSIS — F329 Major depressive disorder, single episode, unspecified: Secondary | ICD-10-CM | POA: Insufficient documentation

## 2018-01-15 DIAGNOSIS — E162 Hypoglycemia, unspecified: Secondary | ICD-10-CM | POA: Diagnosis present

## 2018-01-15 DIAGNOSIS — F1010 Alcohol abuse, uncomplicated: Secondary | ICD-10-CM | POA: Insufficient documentation

## 2018-01-15 DIAGNOSIS — Z8673 Personal history of transient ischemic attack (TIA), and cerebral infarction without residual deficits: Secondary | ICD-10-CM | POA: Insufficient documentation

## 2018-01-15 DIAGNOSIS — Z794 Long term (current) use of insulin: Secondary | ICD-10-CM | POA: Insufficient documentation

## 2018-01-15 DIAGNOSIS — F17290 Nicotine dependence, other tobacco product, uncomplicated: Secondary | ICD-10-CM | POA: Insufficient documentation

## 2018-01-15 DIAGNOSIS — E785 Hyperlipidemia, unspecified: Secondary | ICD-10-CM | POA: Insufficient documentation

## 2018-01-15 DIAGNOSIS — Z79899 Other long term (current) drug therapy: Secondary | ICD-10-CM | POA: Insufficient documentation

## 2018-01-15 DIAGNOSIS — E1122 Type 2 diabetes mellitus with diabetic chronic kidney disease: Secondary | ICD-10-CM | POA: Insufficient documentation

## 2018-01-15 DIAGNOSIS — N390 Urinary tract infection, site not specified: Secondary | ICD-10-CM | POA: Insufficient documentation

## 2018-01-15 DIAGNOSIS — Z23 Encounter for immunization: Secondary | ICD-10-CM | POA: Insufficient documentation

## 2018-01-15 LAB — CBC WITH DIFFERENTIAL/PLATELET
Basophils Absolute: 0 10*3/uL (ref 0–0.1)
Basophils Relative: 1 %
Eosinophils Absolute: 0 10*3/uL (ref 0–0.7)
Eosinophils Relative: 0 %
HCT: 44.2 % (ref 40.0–52.0)
Hemoglobin: 15 g/dL (ref 13.0–18.0)
Lymphocytes Relative: 12 %
Lymphs Abs: 0.7 10*3/uL — ABNORMAL LOW (ref 1.0–3.6)
MCH: 32.9 pg (ref 26.0–34.0)
MCHC: 33.9 g/dL (ref 32.0–36.0)
MCV: 96.9 fL (ref 80.0–100.0)
Monocytes Absolute: 0.3 10*3/uL (ref 0.2–1.0)
Monocytes Relative: 6 %
Neutro Abs: 4.4 10*3/uL (ref 1.4–6.5)
Neutrophils Relative %: 81 %
Platelets: 200 10*3/uL (ref 150–440)
RBC: 4.56 MIL/uL (ref 4.40–5.90)
RDW: 13.2 % (ref 11.5–14.5)
WBC: 5.4 10*3/uL (ref 3.8–10.6)

## 2018-01-15 LAB — URINALYSIS, COMPLETE (UACMP) WITH MICROSCOPIC
Bacteria, UA: NONE SEEN
Bilirubin Urine: NEGATIVE
Glucose, UA: NEGATIVE mg/dL
Ketones, ur: NEGATIVE mg/dL
Leukocytes, UA: NEGATIVE
Nitrite: NEGATIVE
Protein, ur: 100 mg/dL — AB
Specific Gravity, Urine: 1.015 (ref 1.005–1.030)
pH: 5 (ref 5.0–8.0)

## 2018-01-15 LAB — GLUCOSE, CAPILLARY
Glucose-Capillary: 66 mg/dL — ABNORMAL LOW (ref 70–99)
Glucose-Capillary: 162 mg/dL — ABNORMAL HIGH (ref 70–99)
Glucose-Capillary: 42 mg/dL — CL (ref 70–99)
Glucose-Capillary: 37 mg/dL — CL (ref 70–99)
Glucose-Capillary: 58 mg/dL — ABNORMAL LOW (ref 70–99)
Glucose-Capillary: 93 mg/dL (ref 70–99)

## 2018-01-15 LAB — COMPREHENSIVE METABOLIC PANEL
ALT: 55 U/L — ABNORMAL HIGH (ref 0–44)
AST: 179 U/L — ABNORMAL HIGH (ref 15–41)
Albumin: 2.5 g/dL — ABNORMAL LOW (ref 3.5–5.0)
Alkaline Phosphatase: 112 U/L (ref 38–126)
Anion gap: 6 (ref 5–15)
BUN: 25 mg/dL — ABNORMAL HIGH (ref 6–20)
CO2: 25 mmol/L (ref 22–32)
Calcium: 8.4 mg/dL — ABNORMAL LOW (ref 8.9–10.3)
Chloride: 108 mmol/L (ref 98–111)
Creatinine, Ser: 1.34 mg/dL — ABNORMAL HIGH (ref 0.61–1.24)
GFR calc Af Amer: >60 mL/min (ref >60)
GFR calc non Af Amer: 57 mL/min — ABNORMAL LOW (ref >60)
Glucose, Bld: 112 mg/dL — ABNORMAL HIGH (ref 70–99)
Potassium: 4.1 mmol/L (ref 3.5–5.1)
Sodium: 139 mmol/L (ref 135–145)
Total Bilirubin: 0.5 mg/dL (ref 0.3–1.2)
Total Protein: 6 g/dL — ABNORMAL LOW (ref 6.5–8.1)

## 2018-01-15 MED ORDER — ONDANSETRON HCL 4 MG PO TABS: 4.0000 mg | ORAL_TABLET | Freq: Four times a day (QID) | ORAL | Status: DC | PRN

## 2018-01-15 MED ORDER — ENOXAPARIN SODIUM 40 MG/0.4ML ~~LOC~~ SOLN
40.0000 mg | SUBCUTANEOUS | Status: DC
  Administered 2018-01-15: 22:00:00 40 mg via SUBCUTANEOUS
  Filled 2018-01-15: qty 0.4

## 2018-01-15 MED ORDER — ACETAMINOPHEN 650 MG RE SUPP: 650.0000 mg | Freq: Four times a day (QID) | RECTAL | Status: DC | PRN

## 2018-01-15 MED ORDER — INFLUENZA VAC SPLIT QUAD 0.5 ML IM SUSY
0.5000 mL | PREFILLED_SYRINGE | INTRAMUSCULAR | Status: AC
  Administered 2018-01-16: 09:00:00 0.5 mL via INTRAMUSCULAR
  Filled 2018-01-15: qty 0.5

## 2018-01-15 MED ORDER — DEXTROSE-NACL 5-0.9 % IV SOLN
INTRAVENOUS | Status: DC
  Administered 2018-01-15 – 2018-01-16 (×3): via INTRAVENOUS

## 2018-01-15 MED ORDER — LISINOPRIL 10 MG PO TABS
10.0000 mg | ORAL_TABLET | Freq: Every day | ORAL | Status: DC
  Administered 2018-01-15 – 2018-01-16 (×2): 10 mg via ORAL
  Filled 2018-01-15 (×2): qty 1

## 2018-01-15 MED ORDER — SODIUM CHLORIDE 0.9 % IV SOLN
1.0000 g | Freq: Once | INTRAVENOUS | Status: AC
  Administered 2018-01-15: 1 g via INTRAVENOUS
  Filled 2018-01-15: qty 10

## 2018-01-15 MED ORDER — ACETAMINOPHEN 325 MG PO TABS: 650.0000 mg | ORAL_TABLET | Freq: Four times a day (QID) | ORAL | Status: DC | PRN

## 2018-01-15 MED ORDER — SERTRALINE HCL 50 MG PO TABS
50.0000 mg | ORAL_TABLET | Freq: Every day | ORAL | Status: DC
  Administered 2018-01-15 – 2018-01-16 (×2): 50 mg via ORAL
  Filled 2018-01-15 (×2): qty 1

## 2018-01-15 MED ORDER — ONDANSETRON HCL 4 MG/2ML IJ SOLN: 4.0000 mg | Freq: Four times a day (QID) | INTRAMUSCULAR | Status: DC | PRN

## 2018-01-15 NOTE — ED Notes (Signed)
This nurse attempted to obtain re collect. Unable to, lab called and asked to come collect. MD aware. Will continue to monitor.

## 2018-01-15 NOTE — ED Provider Notes (Signed)
[facility] Emergency Department Provider Note ____________________________________________   First MD Initiated Contact with Patient 01/15/18 1442     (approximate)  I have reviewed the triage vital signs and the nursing notes.   HISTORY  Chief Complaint Hypoglycemia  HPI [name] is a 57 y.o. male with a history of chronic kidney disease, diabetes, alcohol abuse as well as stroke and hypertension was presented to the emergency department with hypoglycemia.  EMS was called out to the patient's home where he was unresponsive.  He was found to have an initial glucose of 12 and given 1 mg of glucagon IM because an IV was unable to be obtained.  Patient was initially completely unresponsive with bradypnea.  At the time of arrival the patient is awake and alert.  He has no complaints at this time.  Does not remember if he skipped a meal.  However, he admits to taking his insulin and eating later than normal today at approximately 11 AM instead of 7 AM.  He does not report feeling ill lately leading up to this event.  Denies any changes in his dosing of his medications.   Past Medical History:  Diagnosis Date  . Chronic kidney disease   . Diabetes mellitus without complication ([place])   . ETOH abuse   . Hyperlipidemia   . Hypertension   . Stroke [facility])     Patient Active Problem List   Diagnosis Date Noted  . Protein-calorie malnutrition, severe 08/19/2017  . Pancreatitis, acute 08/16/2017  . DKA (diabetic ketoacidoses) ([place]) 08/16/2017  . HTN (hypertension) 08/16/2017  . HLD (hyperlipidemia) 08/16/2017  . Carotid stenosis 08/02/2016  . GERD (gastroesophageal reflux disease) 08/02/2016  . History of esophagogastroduodenoscopy (EGD) 07/01/2016  . History of recent blood transfusion 07/01/2016  . Acute renal failure superimposed on stage 3 chronic kidney disease ([place]) 07/01/2016  . Hyponatremia 07/01/2016  . Diabetes ([place]) 07/01/2016  . Alcohol abuse  07/01/2016  . Monilial esophagitis ([place]) 07/01/2016  . Tobacco abuse 07/01/2016  . Confusion 06/29/2016  . Iron deficiency anemia 06/29/2016  . Coagulopathy ([place]) 06/29/2016    Past Surgical History:  Procedure Laterality Date  . ESOPHAGOGASTRODUODENOSCOPY (EGD) WITH PROPOFOL N/A 07/01/2016   Procedure: ESOPHAGOGASTRODUODENOSCOPY (EGD) WITH PROPOFOL;  Surgeon: San Jetty, MD;  Location: [facility] ENDOSCOPY;  Service: General;  Laterality: N/A;    Prior to Admission medications   Medication Sig Start Date End Date Taking? Authorizing Provider  glimepiride (AMARYL) 4 MG tablet Take 4 mg by mouth every morning. 12/19/17  Yes [provider]  Insulin Glargine (BASAGLAR KWIKPEN) 100 UNIT/ML SOPN Inject 20 Units into the skin every morning. 12/19/17  Yes [provider]  lisinopril (PRINIVIL,ZESTRIL) 10 MG tablet Take 10 mg by mouth daily. 12/19/17  Yes [provider]  sertraline (ZOLOFT) 50 MG tablet Take 25 mg by mouth daily. 12/19/17  Yes [provider]  metFORMIN (GLUCOPHAGE-XR) 500 MG 24 hr tablet Take 1 tablet (500 mg total) by mouth daily with breakfast. 08/22/17   Dustin Flock, MD  Multiple Vitamin (MULTIVITAMIN WITH MINERALS) TABS tablet Take 1 tablet by mouth daily. 07/02/16   Theodoro Grist, MD    Allergies Patient has no known allergies.  Family History  Problem Relation Age of Onset  . CAD Brother     Social History Social History   Tobacco Use  . Smoking status: Current Every Day Smoker    Packs/day: 0.25    Types: Cigars  . Smokeless tobacco: Never Used  Substance Use Topics  . Alcohol use: Yes    Alcohol/week: 10.0 standard drinks    Types: 8 Cans of beer, 2 Shots of liquor per week  . Drug use: No    Review of Systems  Constitutional: No fever/chills Eyes: No visual changes. ENT: No sore throat. Cardiovascular: Denies chest pain. Respiratory: Denies shortness of breath. Gastrointestinal: No abdominal pain.  No nausea, no  vomiting.  No diarrhea.  No constipation. Genitourinary: Negative for dysuria. Musculoskeletal: Negative for back pain. Skin: Negative for rash. Neurological: Negative for headaches, focal weakness or numbness.   ____________________________________________   PHYSICAL EXAM:  VITAL SIGNS: ED Triage Vitals  Enc Vitals Group     BP      Pulse      Resp      Temp      Temp src      SpO2      Weight      Height      Head Circumference      Peak Flow      Pain Score      Pain Loc      Pain Edu?      Excl. in [place]?     Constitutional: Alert and oriented. Well appearing and in no acute distress. Eyes: Conjunctivae are normal.  Head: Atraumatic. Nose: No congestion/rhinnorhea. Mouth/Throat: Mucous membranes are moist.  Neck: No stridor.   Cardiovascular: Normal rate, regular rhythm. Grossly normal heart sounds.   Respiratory: Normal respiratory effort.  No retractions. Lungs CTAB. Gastrointestinal: Soft and nontender. No distention.  Musculoskeletal: No lower extremity tenderness nor edema.  No joint effusions. Neurologic:  Normal speech and language. No gross focal neurologic deficits are appreciated. Skin:  Skin is warm, dry and intact. No rash noted. Psychiatric: Mood and affect are normal. Speech and behavior are normal.  ____________________________________________   LABS (all labs ordered are listed, but only abnormal results are displayed)  Labs Reviewed  CBC WITH DIFFERENTIAL/PLATELET - Abnormal; Notable for the following components:      Result Value   Lymphs Abs 0.7 (*)    All other components within normal limits  URINALYSIS, COMPLETE (UACMP) WITH MICROSCOPIC - Abnormal; Notable for the following components:   Color, Urine YELLOW (*)    APPearance CLEAR (*)    Hgb urine dipstick SMALL (*)    Protein, ur 100 (*)    All other components within normal limits  GLUCOSE, CAPILLARY - Abnormal; Notable for the following components:   Glucose-Capillary 66 (*)     All other components within normal limits  GLUCOSE, CAPILLARY - Abnormal; Notable for the following components:   Glucose-Capillary 162 (*)    All other components within normal limits  COMPREHENSIVE METABOLIC PANEL - Abnormal; Notable for the following components:   Glucose, Bld 112 (*)    BUN 25 (*)    Creatinine, Ser 1.34 (*)    Calcium 8.4 (*)    Total Protein 6.0 (*)    Albumin 2.5 (*)    AST 179 (*)    ALT 55 (*)    GFR calc non Af Amer 57 (*)    All other components within normal limits  GLUCOSE, CAPILLARY - Abnormal; Notable for the following components:   Glucose-Capillary 42 (*)    All other components within normal limits  URINE CULTURE  CBG MONITORING, ED  CBG MONITORING, ED  CBG MONITORING, ED  CBG MONITORING, ED   ____________________________________________  EKG  ED ECG REPORT I, Youlanda Roys  Kamyia Thomason, the attending physician, personally viewed and interpreted this ECG.   Date: 01/15/2018  EKG Time: 1453  Rate: 80  Rhythm: normal sinus rhythm  Axis: Normal  Intervals:nonspecific intraventricular conduction delay  ST&T Change: No ST segment elevation or depression.  No abnormal T wave inversion.  ____________________________________________  [place]   ____________________________________________   PROCEDURES  Procedure(s) performed:   .Critical Care Performed by: Orbie Pyo, MD Authorized by: Orbie Pyo, MD   Critical care provider statement:    Critical care time (minutes):  35   Critical care time was exclusive of:  Separately billable procedures and treating other patients   Critical care was necessary to treat or prevent imminent or life-threatening deterioration of the following conditions:  Metabolic crisis   Critical care was time spent personally by me on the following activities:  Development of treatment plan with patient or surrogate, discussions with consultants, evaluation of patient's response to  treatment, examination of patient, obtaining history from patient or surrogate, ordering and performing treatments and interventions, ordering and review of laboratory studies, ordering and review of radiographic studies, pulse oximetry, re-evaluation of patient's condition and review of old charts    Critical Care performed:   ____________________________________________   INITIAL IMPRESSION / ASSESSMENT AND PLAN / ED COURSE  Pertinent labs & imaging results that were available during my care of the patient were reviewed by me and considered in my medical decision making (see chart for details).  Differential diagnosis includes, but is not limited to, alcohol, illicit or prescription medications, or other toxic ingestion; intracranial pathology such as stroke or intracerebral hemorrhage; fever or infectious causes including sepsis; hypoxemia and/or hypercarbia; uremia; trauma; endocrine related disorders such as diabetes, hypoglycemia, and thyroid-related diseases; hypertensive encephalopathy; etc. As part of my medical decision making, I reviewed the following data within the electronic MEDICAL RECORD NUMBER Notes from prior ED visits  Patient drinking orange juice and eating a [place] sandwich at this time.  We will check labs as well as serial blood glucose measurements.  ----------------------------------------- 7:40 PM on 01/15/2018 -----------------------------------------  Patient dropped his blood sugar again to 42.  This is after department [place] sandwich and drinking juice and having maintained his pressure for some time here in the emergency department.  He will be started on dextrose infusion.  He will be admitted to the hospital.  He is understanding of the diagnosis as well as treatment and willing to comply present at the Dr. Verdell Carmine. ____________________________________________   FINAL CLINICAL IMPRESSION(S) / ED DIAGNOSES  UTI.  Refractory hypoglycemia.   NEW MEDICATIONS  STARTED DURING THIS VISIT:  New Prescriptions   No medications on file     Note:  This document was prepared using Dragon voice recognition software and may include unintentional dictation errors.     Orbie Pyo, MD 01/15/18 [phone]

## 2018-01-15 NOTE — ED Notes (Signed)
Pt ambulatory to toilet in room. Pt informed he needed a repeat glucose after using the restroom before he should call his wife for discharge. Pt upset concerning length of stay In ED today and pt informed he cannot go home now due to glucose. Pt states "well I have already called my wife to come get me I know I should have waited but I already did".

## 2018-01-15 NOTE — ED Notes (Signed)
Pt provided with urinal

## 2018-01-15 NOTE — ED Notes (Signed)
Lonn Georgia, RN aware of bed assigned

## 2018-01-15 NOTE — ED Triage Notes (Signed)
EMS brought pt from home where on arrival pt was unconscious with snoring respirations. Initial blood glucose was 12, after 1 of IM glucagon blood glucose now 36. Unable to obtain IV access. 167/93 83HR 97%RA.

## 2018-01-15 NOTE — ED Notes (Signed)
Attempted to call report. Charge, rn on 1c states the room has not been assigned to a nurse yet. Will call back.

## 2018-01-15 NOTE — ED Notes (Signed)
[place]ers [place], RN at given report.

## 2018-01-15 NOTE — H&P (Signed)
[place] [facility] [place] NAME: [name]    MR#:  [id number]  DATE OF BIRTH:  1960-10-06  DATE OF ADMISSION:  01/15/2018  PRIMARY CARE PHYSICIAN: [name], MD   REQUESTING/REFERRING PHYSICIAN: Dr. Larae Grooms  CHIEF COMPLAINT:   Chief Complaint  Patient presents with  . Hypoglycemia    HISTORY OF PRESENT ILLNESS:  [name]  is a 57 y.o. male with a known history of diabetes, hypertension, history of previous CVA, alcohol abuse, hyperlipidemia, medical noncompliance who presents to the hospital he was found unresponsive at home and found to be severely hypoglycemic.  His initial blood sugar was 12 and he was given glucagon and his blood sugars improved.  He was also given some to eat and drink and his blood sugars improved but shortly thereafter they have become low again.  Hospitalist services were therefore contacted for admission due to ongoing hypoglycemia.  Patient is a poor historian but says that he has had no changes in his diabetic regimen and has not doubled up on his insulin or taking more of his medications accidentally.  He presents to the ER and was noted to have refractory hypoglycemia despite getting glucagon, dextrose and eating and therefore hospitalist services were contacted for admission.  Patient denies any chest pains, shortness of breath, fever, chills, headache, dysuria, hematuria or any other associated symptoms presently.  PAST MEDICAL HISTORY:   Past Medical History:  Diagnosis Date  . Chronic kidney disease   . Diabetes mellitus without complication ([place])   . ETOH abuse   . Hyperlipidemia   . Hypertension   . Stroke [facility])     PAST SURGICAL HISTORY:   Past Surgical History:  Procedure Laterality Date  . ESOPHAGOGASTRODUODENOSCOPY (EGD) WITH PROPOFOL N/A 07/01/2016   Procedure: ESOPHAGOGASTRODUODENOSCOPY (EGD) WITH PROPOFOL;  Surgeon: San Jetty, MD;  Location: [facility] ENDOSCOPY;  Service: General;   Laterality: N/A;    SOCIAL HISTORY:   Social History   Tobacco Use  . Smoking status: Current Every Day Smoker    Packs/day: 0.25    Years: 5.00    Pack years: 1.25    Types: Cigars  . Smokeless tobacco: Never Used  Substance Use Topics  . Alcohol use: Yes    Alcohol/week: 10.0 standard drinks    Types: 8 Cans of beer, 2 Shots of liquor per week    FAMILY HISTORY:   Family History  Problem Relation Age of Onset  . CAD Brother   . Dementia Mother   . Renal Disease Father     DRUG ALLERGIES:  No Known Allergies  REVIEW OF SYSTEMS:   Review of Systems  Constitutional: Negative for fever and weight loss.  HENT: Negative for congestion, nosebleeds and tinnitus.   Eyes: Negative for blurred vision, double vision and redness.  Respiratory: Negative for cough, hemoptysis and shortness of breath.   Cardiovascular: Negative for chest pain, orthopnea, leg swelling and PND.  Gastrointestinal: Negative for abdominal pain, diarrhea, melena, nausea and vomiting.  Genitourinary: Negative for dysuria, hematuria and urgency.  Musculoskeletal: Negative for falls and joint pain.  Neurological: Negative for dizziness, tingling, sensory change, focal weakness, seizures, weakness and headaches.  Endo/Heme/Allergies: Negative for polydipsia. Does not bruise/bleed easily.  Psychiatric/Behavioral: Negative for depression and memory loss. The patient is not nervous/anxious.     MEDICATIONS AT HOME:   Prior to Admission medications   Medication Sig Start Date End Date Taking? Authorizing Provider  glimepiride (AMARYL) 4 MG tablet  Take 4 mg by mouth every morning. 12/19/17  Yes [provider]  Insulin Glargine (BASAGLAR KWIKPEN) 100 UNIT/ML SOPN Inject 20 Units into the skin every morning. 12/19/17  Yes [provider]  lisinopril (PRINIVIL,ZESTRIL) 10 MG tablet Take 10 mg by mouth daily. 12/19/17  Yes [provider]  sertraline (ZOLOFT) 50 MG tablet Take 50 mg by  mouth daily.  12/19/17  Yes [provider]  metFORMIN (GLUCOPHAGE-XR) 500 MG 24 hr tablet Take 1 tablet (500 mg total) by mouth daily with breakfast. Patient not taking: Reported on 01/15/2018 08/22/17   Dustin Flock, MD  Multiple Vitamin (MULTIVITAMIN WITH MINERALS) TABS tablet Take 1 tablet by mouth daily. Patient not taking: Reported on 01/15/2018 07/02/16   Theodoro Grist, MD      VITAL SIGNS:  Blood pressure (!) 169/95, pulse 72, temperature (!) 97.5 F (36.4 C), temperature source Oral, resp. rate 16, height 5\' 11"  (1.803 m), weight 65.8 kg, SpO2 100 %.  PHYSICAL EXAMINATION:  Physical Exam  GENERAL:  57 y.o.-year-old patient lying in the bed in NAD.  EYES: Pupils equal, round, reactive to light and accommodation. No scleral icterus. Extraocular muscles intact.  HEENT: Head atraumatic, normocephalic. Oropharynx and nasopharynx clear. No oropharyngeal erythema, dry oral mucosa  NECK:  Supple, no jugular venous distention. No thyroid enlargement, no tenderness.  LUNGS: Normal breath sounds bilaterally, no wheezing, rales, rhonchi. No use of accessory muscles of respiration.  CARDIOVASCULAR: S1, S2 RRR. No murmurs, rubs, gallops, clicks.  ABDOMEN: Soft, nontender, nondistended. Bowel sounds present. No organomegaly or mass.  EXTREMITIES: No pedal edema, cyanosis, or clubbing. + 2 pedal & radial pulses b/l.   NEUROLOGIC: Cranial nerves II through XII are intact. No focal Motor or sensory deficits appreciated b/l. Globally weak.  PSYCHIATRIC: The patient is alert and oriented x 3.  SKIN: No obvious rash, lesion, or ulcer.   LABORATORY PANEL:   CBC Recent Labs  Lab 01/15/18 1456  WBC 5.4  HGB 15.0  HCT 44.2  PLT 200   ------------------------------------------------------------------------------------------------------------------  Chemistries  Recent Labs  Lab 01/15/18 1707  NA 139  K 4.1  CL 108  CO2 25  GLUCOSE 112*  BUN 25*  CREATININE 1.34*  CALCIUM 8.4*   AST 179*  ALT 55*  ALKPHOS 112  BILITOT 0.5   ------------------------------------------------------------------------------------------------------------------  Cardiac Enzymes No results for input(s): TROPONINI in the last 168 hours. ------------------------------------------------------------------------------------------------------------------  RADIOLOGY:  No results found.   IMPRESSION AND PLAN:   57 year old male with past medical history of diabetes, hypertension, chronic kidney disease stage III, alcohol abuse, medical noncompliance who presents to the hospital due to altered mental status and noted to be hypoglycemic.  1.  Altered mental status-metabolic encephalopathy secondary to hypoglycemia. - Patient has been given glucagon and glucose supplements and also some to eat and his blood sugars have improved mental status is now back to baseline.  2.  Hypoglycemia- etiology unclear.  Suspected to be secondary to noncompliance.  Patient says that he has not increased his intake of insulin or his diabetic meds.  He does have a history of alcohol abuse and therefore suspicious for having a poor p.o. intake and alcoholic ketosis with hypoglycemia. - We will place the patient on dextrose drip, follow blood sugars.  Hold his insulin, glimepiride and metformin for now. -We will consult diabetes coordinator.  Check a hemoglobin A1c.  3.  Chronic kidney disease stage III-patient's creatinine is close to baseline.  We will continue to monitor.  4.  Essential hypertension-continue lisinopril.  5.  Depression-continue Zoloft.     All the records are reviewed and case discussed with ED provider. Management plans discussed with the patient, family and they are in agreement.  CODE STATUS: Full code  TOTAL TIME TAKING CARE OF THIS PATIENT: 40 minutes.    Henreitta Leber M.D on 01/15/2018 at 8:19 PM  Between 7am to 6pm - Pager - [phone]  After 6pm go to [email or web address] -  password [email or web address] [place] [facility]  Office  [phone]  CC: Primary care physician; [name], MD

## 2018-01-16 LAB — CBC
HCT: 35.8 % — ABNORMAL LOW (ref 40.0–52.0)
Hemoglobin: 12.3 g/dL — ABNORMAL LOW (ref 13.0–18.0)
MCH: 33.1 pg (ref 26.0–34.0)
MCHC: 34.3 g/dL (ref 32.0–36.0)
MCV: 96.4 fL (ref 80.0–100.0)
Platelets: 170 10*3/uL (ref 150–440)
RBC: 3.71 MIL/uL — ABNORMAL LOW (ref 4.40–5.90)
RDW: 13.2 % (ref 11.5–14.5)
WBC: 4.7 10*3/uL (ref 3.8–10.6)

## 2018-01-16 LAB — BASIC METABOLIC PANEL
Anion gap: 5 (ref 5–15)
BUN: 25 mg/dL — ABNORMAL HIGH (ref 6–20)
CO2: 24 mmol/L (ref 22–32)
Calcium: 7.8 mg/dL — ABNORMAL LOW (ref 8.9–10.3)
Chloride: 110 mmol/L (ref 98–111)
Creatinine, Ser: 1.33 mg/dL — ABNORMAL HIGH (ref 0.61–1.24)
GFR calc Af Amer: >60 mL/min (ref >60)
GFR calc non Af Amer: 57 mL/min — ABNORMAL LOW (ref >60)
Glucose, Bld: 178 mg/dL — ABNORMAL HIGH (ref 70–99)
Potassium: 3.8 mmol/L (ref 3.5–5.1)
Sodium: 139 mmol/L (ref 135–145)

## 2018-01-16 LAB — HEMOGLOBIN A1C
Hgb A1c MFr Bld: 5.7 % — ABNORMAL HIGH (ref 4.8–5.6)
Mean Plasma Glucose: 116.89 mg/dL

## 2018-01-16 LAB — GLUCOSE, CAPILLARY
Glucose-Capillary: 83 mg/dL (ref 70–99)
Glucose-Capillary: 204 mg/dL — ABNORMAL HIGH (ref 70–99)

## 2018-01-16 NOTE — Progress Notes (Addendum)
Inpatient Diabetes Program Recommendations  AACE/ADA: New Consensus Statement on Inpatient Glycemic Control (2015)  Target Ranges:  Prepandial:   less than 140 mg/dL      Peak postprandial:   less than 180 mg/dL (1-2 hours)      Critically ill patients:  140 - 180 mg/dL   Lab Results  Component Value Date   GLUCAP 204 (H) 01/16/2018   HGBA1C 5.7 (H) 01/15/2018  Results for HENCE, DERRICK (MRN [id number]) as of 01/16/2018 15:26  Ref. Range 08/17/2017 10:14 01/15/2018 14:56  Hemoglobin A1C Latest Ref Range: 4.8 - 5.6 % 12.4 (H) 5.7 (H)    Review of Glycemic ControlResults for PHARELL, ROLFSON (MRN [id number]) as of 01/16/2018 15:26  Ref. Range 01/15/2018 19:47 01/15/2018 20:52 01/15/2018 21:24 01/16/2018 07:47 01/16/2018 11:36  Glucose-Capillary Latest Ref Range: 70 - 99 mg/dL 37 (LL) 58 (L) 93 83 204 (H)    Diabetes history: Type 2 DM  Outpatient Diabetes medications: Amaryl 4 mg daily, Basaglar 20 units q AM, Metformin XR 500 mg q 24 hours Current orders for Inpatient glycemic control:  None  Inpatient Diabetes Program Recommendations:    Note patient admitted with low blood sugars. A1C has reduced significantly since April, 2019.  Called and spoke to patient regarding low blood sugars. He states that blood sugars were "sometimes" low.  We discussed change in A1C level and d/c of insulin and DM medications.  He is to f/u with PCP.  Also encouraged him to continue to monitor blood sugars at least 2 times a day and to let MD know if high >180 mg/dL or Low<70 mg/dL.  We discussed symptoms and dangers of low blood sugars.  Based on A1C, he likely will not need insulin at home.   Thanks,  Adah Perl, RN, BC-ADM Inpatient Diabetes Coordinator Pager [phone] (8a-5p)

## 2018-01-16 NOTE — Discharge Summary (Signed)
[facility] [place] NAME: [name]    MR#:  [id number]  DATE OF BIRTH:  Jan 14, 1961  DATE OF ADMISSION:  01/15/2018 ADMITTING PHYSICIAN: Henreitta Leber, MD  DATE OF DISCHARGE: 01/16/2018  PRIMARY CARE PHYSICIAN: [name], MD    ADMISSION DIAGNOSIS:  Hypoglycemia [E16.2] Urinary tract infection without hematuria, site unspecified [N39.0]  DISCHARGE DIAGNOSIS:  Active Problems:   Hypoglycemia   SECONDARY DIAGNOSIS:   Past Medical History:  Diagnosis Date  . Chronic kidney disease   . Diabetes mellitus without complication ([place])   . ETOH abuse   . Hyperlipidemia   . Hypertension   . Stroke [facility])     HOSPITAL COURSE:   57 year old male with history of tobacco, alcohol, diabetes and chronic kidney disease stage III who presents to the hospital with altered mental status and found to have low blood sugar.  1.  Acute metabolic encephalopathy due to hypoglycemia: Patient's mental status is at baseline  2.  Hypoglycemia due to weight loss: All oral medications have been on hold for now.  He has follow-up tomorrow with his PCP.  He is asked to record his blood sugars q. before meals and nightly.   3.  Chronic kidney disease stage III: Creatinine is at baseline  4.  Essential hypertension: Continue lisinopril  .Tobacco dependence: Patient is encouraged to quit smoking. Counseling was provided for 4 minutes.   DISCHARGE CONDITIONS AND DIET:  Stable Diabetic diet  CONSULTS OBTAINED:    DRUG ALLERGIES:  No Known Allergies  DISCHARGE MEDICATIONS:   Allergies as of 01/16/2018   No Known Allergies     Medication List    STOP taking these medications   BASAGLAR KWIKPEN 100 UNIT/ML Sopn   glimepiride 4 MG tablet Commonly known as:  AMARYL   metFORMIN 500 MG 24 hr tablet Commonly known as:  GLUCOPHAGE-XR     TAKE these medications   lisinopril 10 MG tablet Commonly known as:  PRINIVIL,ZESTRIL Take 10 mg by  mouth daily.   multivitamin with minerals Tabs tablet Take 1 tablet by mouth daily.   sertraline 50 MG tablet Commonly known as:  ZOLOFT Take 50 mg by mouth daily.         Today   CHIEF COMPLAINT:  Patient wants to go home.  He says he cannot miss work.   VITAL SIGNS:  Blood pressure 136/85, pulse 73, temperature 98.6 F (37 C), temperature source Oral, resp. rate 18, height 5\' 11"  (1.803 m), weight 65.8 kg, SpO2 100 %.   REVIEW OF SYSTEMS:  Review of Systems  Constitutional: Negative.  Negative for chills, fever and malaise/fatigue.  HENT: Negative.  Negative for ear discharge, ear pain, hearing loss, nosebleeds and sore throat.   Eyes: Negative.  Negative for blurred vision and pain.  Respiratory: Negative.  Negative for cough, hemoptysis, shortness of breath and wheezing.   Cardiovascular: Negative.  Negative for chest pain, palpitations and leg swelling.  Gastrointestinal: Negative.  Negative for abdominal pain, blood in stool, diarrhea, nausea and vomiting.  Genitourinary: Negative.  Negative for dysuria.  Musculoskeletal: Negative.  Negative for back pain.  Skin: Negative.   Neurological: Negative for dizziness, tremors, speech change, focal weakness, seizures and headaches.  Endo/Heme/Allergies: Negative.  Does not bruise/bleed easily.  Psychiatric/Behavioral: Negative.  Negative for depression, hallucinations and suicidal ideas.     PHYSICAL EXAMINATION:  GENERAL:  57 y.o.-year-old patient lying in the bed with no acute distress.  NECK:  Supple,  no jugular venous distention. No thyroid enlargement, no tenderness.  LUNGS: Normal breath sounds bilaterally, no wheezing, rales,rhonchi  No use of accessory muscles of respiration.  CARDIOVASCULAR: S1, S2 normal. No murmurs, rubs, or gallops.  ABDOMEN: Soft, non-tender, non-distended. Bowel sounds present. No organomegaly or mass.  EXTREMITIES: No pedal edema, cyanosis, or clubbing.  PSYCHIATRIC: The patient is  alert and oriented x 3.  SKIN: No obvious rash, lesion, or ulcer.   DATA REVIEW:   CBC Recent Labs  Lab 01/16/18 0420  WBC 4.7  HGB 12.3*  HCT 35.8*  PLT 170    Chemistries  Recent Labs  Lab 01/15/18 1707 01/16/18 0420  NA 139 139  K 4.1 3.8  CL 108 110  CO2 25 24  GLUCOSE 112* 178*  BUN 25* 25*  CREATININE 1.34* 1.33*  CALCIUM 8.4* 7.8*  AST 179*  --   ALT 55*  --   ALKPHOS 112  --   BILITOT 0.5  --     Cardiac Enzymes No results for input(s): TROPONINI in the last 168 hours.  Microbiology Results  @MICRORSLT48 @  RADIOLOGY:  No results found.    Allergies as of 01/16/2018   No Known Allergies     Medication List    STOP taking these medications   BASAGLAR KWIKPEN 100 UNIT/ML Sopn   glimepiride 4 MG tablet Commonly known as:  AMARYL   metFORMIN 500 MG 24 hr tablet Commonly known as:  GLUCOPHAGE-XR     TAKE these medications   lisinopril 10 MG tablet Commonly known as:  PRINIVIL,ZESTRIL Take 10 mg by mouth daily.   multivitamin with minerals Tabs tablet Take 1 tablet by mouth daily.   sertraline 50 MG tablet Commonly known as:  ZOLOFT Take 50 mg by mouth daily.           Management plans discussed with the patient and he is in agreement. Stable for discharge home  Patient should follow up with pcp  CODE STATUS:     Code Status Orders  (From admission, onward)         Start     Ordered   01/15/18 2158  Full code  Continuous     01/15/18 2157        Code Status History    Date Active Date Inactive Code Status Order ID Comments User Context   08/17/2017 0000 08/21/2017 1532 Full Code [id number]  Lance Coon, MD ED   06/29/2016 2124 07/01/2016 1805 Full Code [id number]  Vaughan Basta, MD Inpatient      TOTAL TIME TAKING CARE OF THIS PATIENT: 38 minutes.    Note: This dictation was prepared with Dragon dictation along with smaller phrase technology. Any transcriptional errors that result from this process are  unintentional.  Ashante Snelling M.D on 01/16/2018 at 11:41 AM  Between 7am to 6pm - Pager - [phone] After 6pm go to [email or web address] - password [facility]  Office  [phone]  CC: Primary care physician; [name], MD

## 2018-01-16 NOTE — Plan of Care (Signed)

## 2018-01-16 NOTE — Plan of Care (Signed)
Problem: Education: Goal: Knowledge of General Education information will improve Description Including pain rating scale, medication(s)/side effects and non-pharmacologic comfort measures 01/16/2018 0041 by Athena Masse, RN Outcome: Progressing 01/16/2018 0040 by Athena Masse, RN Outcome: Progressing   Problem: Health Behavior/Discharge Planning: Goal: Ability to manage health-related needs will improve 01/16/2018 0041 by Athena Masse, RN Outcome: Progressing 01/16/2018 0040 by Athena Masse, RN Outcome: Progressing   Problem: Clinical Measurements: Goal: Ability to maintain clinical measurements within normal limits will improve 01/16/2018 0041 by Athena Masse, RN Outcome: Progressing 01/16/2018 0040 by Athena Masse, RN Outcome: Progressing Goal: Will remain free from infection 01/16/2018 0041 by Athena Masse, RN Outcome: Progressing 01/16/2018 0040 by Athena Masse, RN Outcome: Progressing Goal: Diagnostic test results will improve 01/16/2018 0041 by Athena Masse, RN Outcome: Progressing 01/16/2018 0040 by Athena Masse, RN Outcome: Progressing Goal: Respiratory complications will improve 01/16/2018 0041 by Athena Masse, RN Outcome: Progressing 01/16/2018 0040 by Athena Masse, RN Outcome: Progressing Goal: Cardiovascular complication will be avoided 01/16/2018 0041 by Athena Masse, RN Outcome: Progressing 01/16/2018 0040 by Athena Masse, RN Outcome: Progressing   Problem: Activity: Goal: Risk for activity intolerance will decrease 01/16/2018 0041 by Athena Masse, RN Outcome: Progressing 01/16/2018 0040 by Athena Masse, RN Outcome: Progressing   Problem: Nutrition: Goal: Adequate nutrition will be maintained 01/16/2018 0041 by Athena Masse, RN Outcome: Progressing 01/16/2018 0040 by Athena Masse, RN Outcome: Progressing   Problem: Coping: Goal: Level of anxiety will decrease 01/16/2018  0041 by Athena Masse, RN Outcome: Progressing 01/16/2018 0040 by Athena Masse, RN Outcome: Progressing   Problem: Elimination: Goal: Will not experience complications related to bowel motility 01/16/2018 0041 by Athena Masse, RN Outcome: Progressing 01/16/2018 0040 by Athena Masse, RN Outcome: Progressing Goal: Will not experience complications related to urinary retention 01/16/2018 0041 by Athena Masse, RN Outcome: Progressing 01/16/2018 0040 by Athena Masse, RN Outcome: Progressing   Problem: Pain Managment: Goal: General experience of comfort will improve 01/16/2018 0041 by Athena Masse, RN Outcome: Progressing 01/16/2018 0040 by Athena Masse, RN Outcome: Progressing   Problem: Safety: Goal: Ability to remain free from injury will improve 01/16/2018 0041 by Athena Masse, RN Outcome: Progressing 01/16/2018 0040 by Athena Masse, RN Outcome: Progressing   Problem: Skin Integrity: Goal: Risk for impaired skin integrity will decrease 01/16/2018 0041 by Athena Masse, RN Outcome: Progressing 01/16/2018 0040 by Athena Masse, RN Outcome: Progressing   Problem: Education: Goal: Knowledge of General Education information will improve Description Including pain rating scale, medication(s)/side effects and non-pharmacologic comfort measures 01/16/2018 0041 by Athena Masse, RN Outcome: Progressing 01/16/2018 0041 by Athena Masse, RN Outcome: Progressing 01/16/2018 0040 by Athena Masse, RN Outcome: Progressing   Problem: Health Behavior/Discharge Planning: Goal: Ability to manage health-related needs will improve 01/16/2018 0041 by Athena Masse, RN Outcome: Progressing 01/16/2018 0041 by Athena Masse, RN Outcome: Progressing 01/16/2018 0040 by Athena Masse, RN Outcome: Progressing   Problem: Clinical Measurements: Goal: Ability to maintain clinical measurements within normal limits will  improve 01/16/2018 0041 by Athena Masse, RN Outcome: Progressing 01/16/2018 0041 by Athena Masse, RN Outcome: Progressing 01/16/2018 0040 by Athena Masse, RN Outcome: Progressing Goal: Will remain free from infection 01/16/2018 0041 by Athena Masse, RN Outcome: Progressing 01/16/2018 0041 by Athena Masse, RN Outcome: Progressing 01/16/2018 0040 by Aquilla Solian  M, RN Outcome: Progressing Goal: Diagnostic test results will improve 01/16/2018 0041 by Athena Masse, RN Outcome: Progressing 01/16/2018 0041 by Athena Masse, RN Outcome: Progressing 01/16/2018 0040 by Athena Masse, RN Outcome: Progressing Goal: Respiratory complications will improve 01/16/2018 0041 by Athena Masse, RN Outcome: Progressing 01/16/2018 0041 by Athena Masse, RN Outcome: Progressing 01/16/2018 0040 by Athena Masse, RN Outcome: Progressing Goal: Cardiovascular complication will be avoided 01/16/2018 0041 by Athena Masse, RN Outcome: Progressing 01/16/2018 0041 by Athena Masse, RN Outcome: Progressing 01/16/2018 0040 by Athena Masse, RN Outcome: Progressing   Problem: Activity: Goal: Risk for activity intolerance will decrease 01/16/2018 0041 by Athena Masse, RN Outcome: Progressing 01/16/2018 0041 by Athena Masse, RN Outcome: Progressing 01/16/2018 0040 by Athena Masse, RN Outcome: Progressing   Problem: Nutrition: Goal: Adequate nutrition will be maintained 01/16/2018 0041 by Athena Masse, RN Outcome: Progressing 01/16/2018 0041 by Athena Masse, RN Outcome: Progressing 01/16/2018 0040 by Athena Masse, RN Outcome: Progressing   Problem: Coping: Goal: Level of anxiety will decrease 01/16/2018 0041 by Athena Masse, RN Outcome: Progressing 01/16/2018 0041 by Athena Masse, RN Outcome: Progressing 01/16/2018 0040 by Athena Masse, RN Outcome: Progressing   Problem: Elimination: Goal: Will not  experience complications related to bowel motility 01/16/2018 0041 by Athena Masse, RN Outcome: Progressing 01/16/2018 0041 by Athena Masse, RN Outcome: Progressing 01/16/2018 0040 by Athena Masse, RN Outcome: Progressing Goal: Will not experience complications related to urinary retention 01/16/2018 0041 by Athena Masse, RN Outcome: Progressing 01/16/2018 0041 by Athena Masse, RN Outcome: Progressing 01/16/2018 0040 by Athena Masse, RN Outcome: Progressing   Problem: Pain Managment: Goal: General experience of comfort will improve 01/16/2018 0041 by Athena Masse, RN Outcome: Progressing 01/16/2018 0041 by Athena Masse, RN Outcome: Progressing 01/16/2018 0040 by Athena Masse, RN Outcome: Progressing   Problem: Safety: Goal: Ability to remain free from injury will improve 01/16/2018 0041 by Athena Masse, RN Outcome: Progressing 01/16/2018 0041 by Athena Masse, RN Outcome: Progressing 01/16/2018 0040 by Athena Masse, RN Outcome: Progressing   Problem: Skin Integrity: Goal: Risk for impaired skin integrity will decrease 01/16/2018 0041 by Athena Masse, RN Outcome: Progressing 01/16/2018 0041 by Athena Masse, RN Outcome: Progressing 01/16/2018 0040 by Athena Masse, RN Outcome: Progressing

## 2018-01-17 LAB — URINE CULTURE: Culture: >=100000 — AB

## 2018-02-26 ENCOUNTER — Emergency Department: Payer: Medicare Other

## 2018-02-26 ENCOUNTER — Other Ambulatory Visit: Payer: Self-pay

## 2018-02-26 ENCOUNTER — Encounter: Payer: Self-pay | Admitting: Emergency Medicine

## 2018-02-26 ENCOUNTER — Inpatient Hospital Stay
Admission: EM | Admit: 2018-02-26 | Discharge: 2018-03-04 | DRG: 683 | Disposition: A | Discharge status: Home / Self Care | Payer: Medicare Other | Attending: Internal Medicine | Admitting: Internal Medicine

## 2018-02-26 DIAGNOSIS — F17290 Nicotine dependence, other tobacco product, uncomplicated: Secondary | ICD-10-CM | POA: Diagnosis present

## 2018-02-26 DIAGNOSIS — Z841 Family history of disorders of kidney and ureter: Secondary | ICD-10-CM

## 2018-02-26 DIAGNOSIS — N3001 Acute cystitis with hematuria: Secondary | ICD-10-CM | POA: Diagnosis present

## 2018-02-26 DIAGNOSIS — N179 Acute kidney failure, unspecified: Principal | ICD-10-CM | POA: Diagnosis present

## 2018-02-26 DIAGNOSIS — E1122 Type 2 diabetes mellitus with diabetic chronic kidney disease: Secondary | ICD-10-CM | POA: Diagnosis present

## 2018-02-26 DIAGNOSIS — R3911 Hesitancy of micturition: Secondary | ICD-10-CM | POA: Diagnosis present

## 2018-02-26 DIAGNOSIS — E785 Hyperlipidemia, unspecified: Secondary | ICD-10-CM | POA: Diagnosis present

## 2018-02-26 DIAGNOSIS — Z8249 Family history of ischemic heart disease and other diseases of the circulatory system: Secondary | ICD-10-CM

## 2018-02-26 DIAGNOSIS — E1165 Type 2 diabetes mellitus with hyperglycemia: Secondary | ICD-10-CM | POA: Diagnosis present

## 2018-02-26 DIAGNOSIS — Z681 Body mass index (BMI) 19 or less, adult: Secondary | ICD-10-CM

## 2018-02-26 DIAGNOSIS — Z79899 Other long term (current) drug therapy: Secondary | ICD-10-CM

## 2018-02-26 DIAGNOSIS — N183 Chronic kidney disease, stage 3 (moderate): Secondary | ICD-10-CM | POA: Diagnosis present

## 2018-02-26 DIAGNOSIS — E871 Hypo-osmolality and hyponatremia: Secondary | ICD-10-CM | POA: Diagnosis not present

## 2018-02-26 DIAGNOSIS — E1143 Type 2 diabetes mellitus with diabetic autonomic (poly)neuropathy: Secondary | ICD-10-CM | POA: Diagnosis present

## 2018-02-26 DIAGNOSIS — M2540 Effusion, unspecified joint: Secondary | ICD-10-CM

## 2018-02-26 DIAGNOSIS — T380X5A Adverse effect of glucocorticoids and synthetic analogues, initial encounter: Secondary | ICD-10-CM | POA: Diagnosis not present

## 2018-02-26 DIAGNOSIS — E872 Acidosis, unspecified: Secondary | ICD-10-CM

## 2018-02-26 DIAGNOSIS — E11649 Type 2 diabetes mellitus with hypoglycemia without coma: Secondary | ICD-10-CM | POA: Diagnosis present

## 2018-02-26 DIAGNOSIS — I959 Hypotension, unspecified: Secondary | ICD-10-CM | POA: Diagnosis present

## 2018-02-26 DIAGNOSIS — Z888 Allergy status to other drugs, medicaments and biological substances status: Secondary | ICD-10-CM

## 2018-02-26 DIAGNOSIS — K3184 Gastroparesis: Secondary | ICD-10-CM | POA: Diagnosis present

## 2018-02-26 DIAGNOSIS — I10 Essential (primary) hypertension: Secondary | ICD-10-CM | POA: Diagnosis present

## 2018-02-26 DIAGNOSIS — Z9114 Patient's other noncompliance with medication regimen: Secondary | ICD-10-CM

## 2018-02-26 DIAGNOSIS — R634 Abnormal weight loss: Secondary | ICD-10-CM | POA: Diagnosis present

## 2018-02-26 DIAGNOSIS — IMO0002 Reserved for concepts with insufficient information to code with codable children: Secondary | ICD-10-CM | POA: Diagnosis present

## 2018-02-26 DIAGNOSIS — E08641 Diabetes mellitus due to underlying condition with hypoglycemia with coma: Secondary | ICD-10-CM

## 2018-02-26 DIAGNOSIS — E162 Hypoglycemia, unspecified: Secondary | ICD-10-CM

## 2018-02-26 DIAGNOSIS — M109 Gout, unspecified: Secondary | ICD-10-CM | POA: Diagnosis present

## 2018-02-26 DIAGNOSIS — R52 Pain, unspecified: Secondary | ICD-10-CM

## 2018-02-26 DIAGNOSIS — N184 Chronic kidney disease, stage 4 (severe): Secondary | ICD-10-CM | POA: Diagnosis present

## 2018-02-26 DIAGNOSIS — N390 Urinary tract infection, site not specified: Secondary | ICD-10-CM | POA: Diagnosis present

## 2018-02-26 DIAGNOSIS — K219 Gastro-esophageal reflux disease without esophagitis: Secondary | ICD-10-CM | POA: Diagnosis present

## 2018-02-26 DIAGNOSIS — I69351 Hemiplegia and hemiparesis following cerebral infarction affecting right dominant side: Secondary | ICD-10-CM

## 2018-02-26 DIAGNOSIS — B952 Enterococcus as the cause of diseases classified elsewhere: Secondary | ICD-10-CM | POA: Diagnosis present

## 2018-02-26 LAB — BASIC METABOLIC PANEL
Anion gap: 12 (ref 5–15)
BUN: 57 mg/dL — ABNORMAL HIGH (ref 6–20)
CO2: 21 mmol/L — ABNORMAL LOW (ref 22–32)
Calcium: 8.3 mg/dL — ABNORMAL LOW (ref 8.9–10.3)
Chloride: 97 mmol/L — ABNORMAL LOW (ref 98–111)
Creatinine, Ser: 4.24 mg/dL — ABNORMAL HIGH (ref 0.61–1.24)
GFR calc Af Amer: 16 mL/min — ABNORMAL LOW (ref >60)
GFR calc non Af Amer: 14 mL/min — ABNORMAL LOW (ref >60)
Glucose, Bld: 441 mg/dL — ABNORMAL HIGH (ref 70–99)
Potassium: 5.1 mmol/L (ref 3.5–5.1)
Sodium: 130 mmol/L — ABNORMAL LOW (ref 135–145)

## 2018-02-26 LAB — COMPREHENSIVE METABOLIC PANEL
ALT: 30 U/L (ref 0–44)
AST: 59 U/L — ABNORMAL HIGH (ref 15–41)
Albumin: 3.1 g/dL — ABNORMAL LOW (ref 3.5–5.0)
Alkaline Phosphatase: 110 U/L (ref 38–126)
Anion gap: 15 (ref 5–15)
BUN: 62 mg/dL — ABNORMAL HIGH (ref 6–20)
CO2: 23 mmol/L (ref 22–32)
Calcium: 8.5 mg/dL — ABNORMAL LOW (ref 8.9–10.3)
Chloride: 91 mmol/L — ABNORMAL LOW (ref 98–111)
Creatinine, Ser: 4.62 mg/dL — ABNORMAL HIGH (ref 0.61–1.24)
GFR calc Af Amer: 15 mL/min — ABNORMAL LOW (ref >60)
GFR calc non Af Amer: 13 mL/min — ABNORMAL LOW (ref >60)
Glucose, Bld: 548 mg/dL (ref 70–99)
Potassium: 5 mmol/L (ref 3.5–5.1)
Sodium: 129 mmol/L — ABNORMAL LOW (ref 135–145)
Total Bilirubin: 1.2 mg/dL (ref 0.3–1.2)
Total Protein: 6.8 g/dL (ref 6.5–8.1)

## 2018-02-26 LAB — GLUCOSE, CAPILLARY
Glucose-Capillary: 163 mg/dL — ABNORMAL HIGH (ref 70–99)
Glucose-Capillary: 292 mg/dL — ABNORMAL HIGH (ref 70–99)
Glucose-Capillary: 324 mg/dL — ABNORMAL HIGH (ref 70–99)
Glucose-Capillary: 37 mg/dL — CL (ref 70–99)
Glucose-Capillary: 61 mg/dL — ABNORMAL LOW (ref 70–99)

## 2018-02-26 LAB — CBC WITH DIFFERENTIAL/PLATELET
Abs Immature Granulocytes: 0.06 10*3/uL (ref 0.00–0.07)
Basophils Absolute: 0 10*3/uL (ref 0.0–0.1)
Basophils Relative: 0 %
Eosinophils Absolute: 0.1 10*3/uL (ref 0.0–0.5)
Eosinophils Relative: 1 %
HCT: 47.6 % (ref 39.0–52.0)
Hemoglobin: 15.8 g/dL (ref 13.0–17.0)
Immature Granulocytes: 1 %
Lymphocytes Relative: 24 %
Lymphs Abs: 1.7 10*3/uL (ref 0.7–4.0)
MCH: 32 pg (ref 26.0–34.0)
MCHC: 33.2 g/dL (ref 30.0–36.0)
MCV: 96.4 fL (ref 80.0–100.0)
Monocytes Absolute: 0.6 10*3/uL (ref 0.1–1.0)
Monocytes Relative: 9 %
Neutro Abs: 4.6 10*3/uL (ref 1.7–7.7)
Neutrophils Relative %: 65 %
Platelets: 183 10*3/uL (ref 150–400)
RBC: 4.94 MIL/uL (ref 4.22–5.81)
RDW: 11.7 % (ref 11.5–15.5)
WBC: 7.1 10*3/uL (ref 4.0–10.5)
nRBC: 0 % (ref 0.0–0.2)

## 2018-02-26 LAB — LACTIC ACID, PLASMA
Lactic Acid, Venous: 2.6 mmol/L (ref 0.5–1.9)
Lactic Acid, Venous: 1.2 mmol/L (ref 0.5–1.9)

## 2018-02-26 LAB — PROTIME-INR
INR: 0.93
Prothrombin Time: 12.4 seconds (ref 11.4–15.2)

## 2018-02-26 LAB — LIPASE, BLOOD: Lipase: 27 U/L (ref 11–51)

## 2018-02-26 MED ORDER — DEXTROSE 10 % IV SOLN
INTRAVENOUS | Status: DC
  Administered 2018-02-26: 21:00:00 via INTRAVENOUS

## 2018-02-26 MED ORDER — SODIUM CHLORIDE 0.9 % IV BOLUS
1000.0000 mL | Freq: Once | INTRAVENOUS | Status: AC
  Administered 2018-02-26: 1000 mL via INTRAVENOUS

## 2018-02-26 MED ORDER — SERTRALINE HCL 50 MG PO TABS
50.0000 mg | ORAL_TABLET | Freq: Every day | ORAL | Status: DC
  Administered 2018-02-27 – 2018-03-04 (×6): 50 mg via ORAL
  Filled 2018-02-26 (×6): qty 1

## 2018-02-26 MED ORDER — ONDANSETRON HCL 4 MG PO TABS: 4.0000 mg | ORAL_TABLET | Freq: Four times a day (QID) | ORAL | Status: DC | PRN

## 2018-02-26 MED ORDER — SODIUM CHLORIDE 0.9 % IV SOLN
INTRAVENOUS | Status: DC
  Administered 2018-02-26 – 2018-03-02 (×8): via INTRAVENOUS

## 2018-02-26 MED ORDER — DEXTROSE 50 % IV SOLN
1.0000 | Freq: Once | INTRAVENOUS | Status: AC
  Administered 2018-02-26: 50 mL via INTRAVENOUS
  Filled 2018-02-26: qty 50

## 2018-02-26 MED ORDER — DEXTROSE 50 % IV SOLN
INTRAVENOUS | Status: AC
  Administered 2018-02-26: 50 mL via INTRAVENOUS
  Filled 2018-02-26: qty 50

## 2018-02-26 MED ORDER — GLUCAGON HCL (RDNA) 1 MG IJ SOLR
1.0000 mg | Freq: Once | INTRAMUSCULAR | Status: DC
  Filled 2018-02-26: qty 1

## 2018-02-26 MED ORDER — GLUCAGON HCL RDNA (DIAGNOSTIC) 1 MG IJ SOLR
INTRAMUSCULAR | Status: AC
  Filled 2018-02-26: qty 1

## 2018-02-26 MED ORDER — ACETAMINOPHEN 650 MG RE SUPP: 650.0000 mg | Freq: Four times a day (QID) | RECTAL | Status: DC | PRN

## 2018-02-26 MED ORDER — ACETAMINOPHEN 325 MG PO TABS
650.0000 mg | ORAL_TABLET | Freq: Four times a day (QID) | ORAL | Status: DC | PRN
  Administered 2018-02-28: 17:00:00 650 mg via ORAL
  Filled 2018-02-26: qty 2

## 2018-02-26 MED ORDER — HEPARIN SODIUM (PORCINE) 5000 UNIT/ML IJ SOLN
5000.0000 [IU] | Freq: Three times a day (TID) | INTRAMUSCULAR | Status: DC
  Administered 2018-02-27 – 2018-03-04 (×17): 5000 [IU] via SUBCUTANEOUS
  Filled 2018-02-26 (×17): qty 1

## 2018-02-26 MED ORDER — ONDANSETRON HCL 4 MG/2ML IJ SOLN: 4.0000 mg | Freq: Four times a day (QID) | INTRAMUSCULAR | Status: DC | PRN

## 2018-02-26 MED ORDER — DEXTROSE 50 % IV SOLN
1.0000 | Freq: Once | INTRAVENOUS | Status: DC
  Administered 2018-02-26: 50 mL via INTRAVENOUS

## 2018-02-26 NOTE — ED Notes (Signed)
Pt attempted to use urinal for urine sample but unable to at this time. MD informed

## 2018-02-26 NOTE — H&P (Signed)
[name] at [place] NAME: [name]    MR#:  [id number]  DATE OF BIRTH:  22-Oct-1960  DATE OF ADMISSION:  02/26/2018  PRIMARY CARE PHYSICIAN: [name], MD   REQUESTING/REFERRING PHYSICIAN: Jacqualine Code, MD  CHIEF COMPLAINT:   Chief Complaint  Patient presents with  . Weakness    HISTORY OF PRESENT ILLNESS:  [name]  is a 57 y.o. male who presents with chief complaint as above.  Patient presents with several days of weakness and labile blood sugars.  He states that he was told to stop his insulin, and his glucose levels got very high.  He then restarted his insulin, and today his glucose levels were extremely low on presentation to the ED.  He has been having significant generalized weakness.  He also states he has been having some urinary hesitancy.  He was recently treated for UTI.  Here in the ED UA seems consistent with UTI again.  Hospitalist were called for admission and further evaluation  PAST MEDICAL HISTORY:   Past Medical History:  Diagnosis Date  . Chronic kidney disease   . Diabetes mellitus without complication ([place])   . ETOH abuse   . Hyperlipidemia   . Hypertension   . Stroke [facility])      PAST SURGICAL HISTORY:   Past Surgical History:  Procedure Laterality Date  . ESOPHAGOGASTRODUODENOSCOPY (EGD) WITH PROPOFOL N/A 07/01/2016   Procedure: ESOPHAGOGASTRODUODENOSCOPY (EGD) WITH PROPOFOL;  Surgeon: San Jetty, MD;  Location: [facility] ENDOSCOPY;  Service: General;  Laterality: N/A;     SOCIAL HISTORY:   Social History   Tobacco Use  . Smoking status: Current Every Day Smoker    Packs/day: 0.25    Years: 5.00    Pack years: 1.25    Types: Cigars  . Smokeless tobacco: Never Used  Substance Use Topics  . Alcohol use: Yes    Alcohol/week: 10.0 standard drinks    Types: 8 Cans of beer, 2 Shots of liquor per week     FAMILY HISTORY:   Family History  Problem Relation Age of Onset  . CAD  Brother   . Dementia Mother   . Renal Disease Father      DRUG ALLERGIES:   Allergies  Allergen Reactions  . Ferrous Gluconate Nausea And Vomiting    MEDICATIONS AT HOME:   Prior to Admission medications   Medication Sig Start Date End Date Taking? Authorizing Provider  lisinopril (PRINIVIL,ZESTRIL) 10 MG tablet Take 10 mg by mouth daily. 12/19/17  Yes [provider]  sertraline (ZOLOFT) 50 MG tablet Take 50 mg by mouth daily.  12/19/17  Yes [provider]  Multiple Vitamin (MULTIVITAMIN WITH MINERALS) TABS tablet Take 1 tablet by mouth daily. Patient not taking: Reported on 01/15/2018 07/02/16   Theodoro Grist, MD    REVIEW OF SYSTEMS:  Review of Systems  Constitutional: Negative for chills, fever, malaise/fatigue and weight loss.  HENT: Negative for ear pain, hearing loss and tinnitus.   Eyes: Negative for blurred vision, double vision, pain and redness.  Respiratory: Negative for cough, hemoptysis and shortness of breath.   Cardiovascular: Negative for chest pain, palpitations, orthopnea and leg swelling.  Gastrointestinal: Negative for abdominal pain, constipation, diarrhea, nausea and vomiting.  Genitourinary: Negative for dysuria, frequency and hematuria.       Hesitancy  Musculoskeletal: Negative for back pain, joint pain and neck pain.  Skin:       No acne, rash, or lesions  Neurological: Positive for weakness. Negative for dizziness, tremors and focal weakness.  Endo/Heme/Allergies: Negative for polydipsia. Does not bruise/bleed easily.  Psychiatric/Behavioral: Negative for depression. The patient is not nervous/anxious and does not have insomnia.      VITAL SIGNS:   Vitals:   02/26/18 2145 02/26/18 2154 02/26/18 2200 02/26/18 2215  BP: (!) 131/91 (!) 131/91 111/79 100/81  Pulse:  74 93   Resp: 15 18 14  (!) 23  Temp:  97.6 F (36.4 C)    TempSrc:  Axillary    SpO2:  95% 98%   Weight:      Height:       Wt Readings from Last 3 Encounters:   02/26/18 49.9 kg  01/15/18 65.8 kg  08/17/17 56.5 kg    PHYSICAL EXAMINATION:  Physical Exam  LABORATORY PANEL:   CBC Recent Labs  Lab 02/26/18 1954  WBC 7.1  HGB 15.8  HCT 47.6  PLT 183   ------------------------------------------------------------------------------------------------------------------  Chemistries  Recent Labs  Lab 02/26/18 1954 02/26/18 2147  NA 129* 130*  K 5.0 5.1  CL 91* 97*  CO2 23 21*  GLUCOSE 548* 441*  BUN 62* 57*  CREATININE 4.62* 4.24*  CALCIUM 8.5* 8.3*  AST 59*  --   ALT 30  --   ALKPHOS 110  --   BILITOT 1.2  --    ------------------------------------------------------------------------------------------------------------------  Cardiac Enzymes No results for input(s): TROPONINI in the last 168 hours. ------------------------------------------------------------------------------------------------------------------  RADIOLOGY:  Ct Head Wo Contrast  Result Date: 02/26/2018 CLINICAL DATA:  Worsening generalized weakness and fluctuating blood sugars all week. Confusion. EXAM: CT HEAD WITHOUT CONTRAST TECHNIQUE: Contiguous axial images were obtained from the base of the skull through the vertex without intravenous contrast. COMPARISON:  06/29/2016 FINDINGS: Brain: Age related involutional changes of the brain. Chronic left frontal infarct with encephalomalacia. Chronic patchy small vessel ischemic disease of periventricular white matter and centra semiovale left greater than right. Remote infarct right caudate head. No acute intracranial hemorrhage, midline shift or edema. No new large vascular territory infarct. No intra-axial mass nor extra-axial fluid. Midline fourth ventricle basal cisterns. Brainstem and cerebellum are nonacute. Vascular: Marked atherosclerosis of the carotid siphons bilaterally. Skull: Intact Sinuses/Orbits: No acute finding. Other: None. IMPRESSION: Mild superficial atrophy with chronic left frontal lobe infarct  associated with encephalomalacia. Bilateral mild to moderate chronic small vessel ischemic disease and chronic right caudate head lacunar infarct. No acute intracranial appearing abnormality. Electronically Signed   By: Ashley Royalty M.D.   On: 02/26/2018 20:50   Dg Chest Port 1 View  Result Date: 02/26/2018 CLINICAL DATA:  Altered mental status EXAM: PORTABLE CHEST 1 VIEW COMPARISON:  06/29/2016 FINDINGS: Normal heart size. Lungs clear. No pneumothorax. No pleural effusion. IMPRESSION: No active disease. Electronically Signed   By: Marybelle Killings M.D.   On: 02/26/2018 20:43    EKG:   Orders placed or performed during the hospital encounter of 02/26/18  . ED EKG 12-Lead  . ED EKG 12-Lead  . EKG 12-Lead  . EKG 12-Lead  . ED EKG  . ED EKG  . EKG 12-Lead  . EKG 12-Lead    IMPRESSION AND PLAN:  Principal Problem:   UTI (urinary tract infection) - IV antibiotics, culture sent Active Problems:   Acute renal failure superimposed on stage 3 chronic kidney disease (HCC) - IV fluids, avoid nephrotoxins and monitor for improvement   Uncontrolled diabetes mellitus ([place]) - patient had profound hypoglycemia on admission, was placed on d10 fluids and glucose responded  well.  D/c d10 for now and monitor   HTN (hypertension) - home meds   GERD (gastroesophageal reflux disease) - home dose ppi   HLD (hyperlipidemia) - home dose antilipid  Chart review performed and case discussed with ED provider. Labs, imaging and/or ECG reviewed by provider and discussed with patient/family. Management plans discussed with the patient and/or family.  DVT PROPHYLAXIS: SubQ heparin  GI PROPHYLAXIS:  PPI   ADMISSION STATUS: Inpatient     CODE STATUS: Full Code Status History    Date Active Date Inactive Code Status Order ID Comments User Context   01/15/2018 2158 01/16/2018 1909 Full Code [id number]  Henreitta Leber, MD Inpatient   08/17/2017 0000 08/21/2017 1532 Full Code [id number]  Lance Coon, MD ED   06/29/2016  2124 07/01/2016 1805 Full Code [id number]  Vaughan Basta, MD Inpatient      TOTAL TIME TAKING CARE OF THIS PATIENT: 45 minutes.   Lexy Meininger [place] 02/26/2018, 10:31 PM  [facility]  Office  [phone]  CC: Primary care physician; [name], MD  Note:  This document was prepared using Dragon voice recognition software and may include unintentional dictation errors.

## 2018-02-26 NOTE — ED Notes (Signed)
Critical tests results notified to MD Quale Lactic 2.6 Glucose 548

## 2018-02-26 NOTE — ED Triage Notes (Addendum)
Pt presents to worsening generalized weakness and fluctuating blood sugars all week. Pt sister states pt has been acting altered and confused today and was unable to walk. Pt denies pain. Decrease in appetite. Pt not able to follow simple instructions in triage and is not answering questions appropriately.

## 2018-02-26 NOTE — ED Notes (Signed)
Lab called for 2nd set of cultures

## 2018-02-26 NOTE — ED Notes (Signed)
Attempted to call wife Regina Coppolino of pt. Left message stating pt's room number and update on pt's status.

## 2018-02-26 NOTE — ED Notes (Signed)
Pharmacy sending medication for pt

## 2018-02-26 NOTE — ED Notes (Signed)
Pt's wife [name] cell [phone]. Call anytime with any updates no matter what!!!!!

## 2018-02-26 NOTE — ED Notes (Signed)
Patient transported to CT 

## 2018-02-26 NOTE — ED Notes (Signed)
Attempted to call report. Placed on hold. Called back and they stated RN would call me back on my ASCOM. ASCOM number given.

## 2018-02-26 NOTE — ED Provider Notes (Signed)
[facility] Emergency Department Provider Note ____________________________________________   First MD Initiated Contact with Patient 02/26/18 1942     (approximate)  I have reviewed the triage vital signs and the nursing notes.  EM caveat: Patient presents with confusion, history provided by sister and wife  HISTORY  Chief Complaint Weakness  HPI [name] is a 57 y.o. male recently hospitalized for problems with his blood sugar and his wife reports that he is been feeling fatigued, shaky having difficulty walking and feeling very weak for about 3 days time.  This morning when he checked his blood sugar was 61 that was after he received insulin last 2 nights for blood sugars of 250 and then 450 the previous night  They have noticed he is just felt very fatigued, seems slightly confused and a little bit off for the last 3 days.  No other specific symptoms been noticed other than he did not want to go the doctor when they offered to take him earlier, but is gotten so fatigued over the last day and not eating anything that actually had to carry him to the car to bring him to the emergency department.  Does not take any diabetes medications except for insulin, which his wife mentioned that he thought he was also told to stop while he was in the hospital but since his blood sugars in the 450s they restarted it   Past Medical History:  Diagnosis Date  . Chronic kidney disease   . Diabetes mellitus without complication ([place])   . ETOH abuse   . Hyperlipidemia   . Hypertension   . Stroke [facility])     Patient Active Problem List   Diagnosis Date Noted  . Hypoglycemia 01/15/2018  . Protein-calorie malnutrition, severe 08/19/2017  . Pancreatitis, acute 08/16/2017  . DKA (diabetic ketoacidoses) ([place]) 08/16/2017  . HTN (hypertension) 08/16/2017  . HLD (hyperlipidemia) 08/16/2017  . Carotid stenosis 08/02/2016  . GERD (gastroesophageal reflux disease) 08/02/2016    . History of esophagogastroduodenoscopy (EGD) 07/01/2016  . History of recent blood transfusion 07/01/2016  . Acute renal failure superimposed on stage 3 chronic kidney disease ([place]) 07/01/2016  . Hyponatremia 07/01/2016  . Uncontrolled diabetes mellitus ([place]) 07/01/2016  . Alcohol abuse 07/01/2016  . Monilial esophagitis ([place]) 07/01/2016  . Tobacco abuse 07/01/2016  . Confusion 06/29/2016  . Iron deficiency anemia 06/29/2016  . Coagulopathy ([place]) 06/29/2016    Past Surgical History:  Procedure Laterality Date  . ESOPHAGOGASTRODUODENOSCOPY (EGD) WITH PROPOFOL N/A 07/01/2016   Procedure: ESOPHAGOGASTRODUODENOSCOPY (EGD) WITH PROPOFOL;  Surgeon: San Jetty, MD;  Location: [facility] ENDOSCOPY;  Service: General;  Laterality: N/A;    Prior to Admission medications   Medication Sig Start Date End Date Taking? Authorizing Provider  lisinopril (PRINIVIL,ZESTRIL) 10 MG tablet Take 10 mg by mouth daily. 12/19/17  Yes [provider]  sertraline (ZOLOFT) 50 MG tablet Take 50 mg by mouth daily.  12/19/17  Yes [provider]  Multiple Vitamin (MULTIVITAMIN WITH MINERALS) TABS tablet Take 1 tablet by mouth daily. Patient not taking: Reported on 01/15/2018 07/02/16   Theodoro Grist, MD    Allergies Ferrous gluconate  Family History  Problem Relation Age of Onset  . CAD Brother   . Dementia Mother   . Renal Disease Father     Social History Social History   Tobacco Use  . Smoking status: Current Every Day Smoker    Packs/day: 0.25    Years: 5.00    Pack years: 1.25  Types: Cigars  . Smokeless tobacco: Never Used  Substance Use Topics  . Alcohol use: Yes    Alcohol/week: 10.0 standard drinks    Types: 8 Cans of beer, 2 Shots of liquor per week  . Drug use: No    Review of Systems  EM caveat  Patient denies being in pain.  Just reports he feels very tired and weak.    ____________________________________________   PHYSICAL EXAM:  VITAL SIGNS: ED Triage  Vitals  Enc Vitals Group     BP 02/26/18 1928 97/84     Pulse Rate 02/26/18 1928 88     Resp 02/26/18 1928 18     Temp --      Temp Source 02/26/18 1928 Oral     SpO2 --      Weight 02/26/18 1945 110 lb (49.9 kg)     Height 02/26/18 1945 5\' 11"  (1.803 m)     Head Circumference --      Peak Flow --      Pain Score 02/26/18 1943 0     Pain Loc --      Pain Edu? --      Excl. in [place]? --     Constitutional: Alert and oriented to self and his birthday but not to year.  Chronically ill-appearing, somewhat cachectic but in no acute distress.  He is pleasant as well as his family at the bedside Eyes: Conjunctivae are normal. Head: Atraumatic. Nose: No congestion/rhinnorhea. Mouth/Throat: Mucous membranes are dry. Neck: No stridor.  Cardiovascular: Normal rate, regular rhythm. Grossly normal heart sounds.  Good peripheral circulation. Respiratory: Normal respiratory effort.  No retractions. Lungs CTAB. Gastrointestinal: Soft and nontender. No distention. Musculoskeletal: No lower extremity tenderness nor edema.  Diminished about 4-5 strength in all extremities, generalized weakness noted. Neurologic:  Normal speech and language. No gross focal neurologic deficits are appreciated.  Skin:  Skin is warm, dry and intact. No rash noted. Psychiatric: Mood and affect are normal. Speech and behavior are normal.  ____________________________________________   LABS (all labs ordered are listed, but only abnormal results are displayed)  Labs Reviewed  GLUCOSE, CAPILLARY - Abnormal; Notable for the following components:      Result Value   Glucose-Capillary 61 (*)    All other components within normal limits  LACTIC ACID, PLASMA - Abnormal; Notable for the following components:   Lactic Acid, Venous 2.6 (*)    All other components within normal limits  COMPREHENSIVE METABOLIC PANEL - Abnormal; Notable for the following components:   Sodium 129 (*)    Chloride 91 (*)    Glucose, Bld 548 (*)     BUN 62 (*)    Creatinine, Ser 4.62 (*)    Calcium 8.5 (*)    Albumin 3.1 (*)    AST 59 (*)    GFR calc non Af Amer 13 (*)    GFR calc Af Amer 15 (*)    All other components within normal limits  URINALYSIS, ROUTINE W REFLEX MICROSCOPIC - Abnormal; Notable for the following components:   Color, Urine AMBER (*)    APPearance CLOUDY (*)    Glucose, UA >=500 (*)    Hgb urine dipstick SMALL (*)    Ketones, ur 5 (*)    Protein, ur 100 (*)    Leukocytes, UA LARGE (*)    WBC, UA >50 (*)    Bacteria, UA RARE (*)    All other components within normal limits  GLUCOSE, CAPILLARY - Abnormal; Notable  for the following components:   Glucose-Capillary 37 (*)    All other components within normal limits  GLUCOSE, CAPILLARY - Abnormal; Notable for the following components:   Glucose-Capillary 163 (*)    All other components within normal limits  BASIC METABOLIC PANEL - Abnormal; Notable for the following components:   Sodium 130 (*)    Chloride 97 (*)    CO2 21 (*)    Glucose, Bld 441 (*)    BUN 57 (*)    Creatinine, Ser 4.24 (*)    Calcium 8.3 (*)    GFR calc non Af Amer 14 (*)    GFR calc Af Amer 16 (*)    All other components within normal limits  GLUCOSE, CAPILLARY - Abnormal; Notable for the following components:   Glucose-Capillary 292 (*)    All other components within normal limits  GLUCOSE, CAPILLARY - Abnormal; Notable for the following components:   Glucose-Capillary 324 (*)    All other components within normal limits  CULTURE, BLOOD (ROUTINE X 2)  CULTURE, BLOOD (ROUTINE X 2)  LACTIC ACID, PLASMA  LIPASE, BLOOD  CBC WITH DIFFERENTIAL/PLATELET  PROTIME-INR  BASIC METABOLIC PANEL  CBC  CBG MONITORING, ED  CBG MONITORING, ED  CBG MONITORING, ED  CBG MONITORING, ED  CBG MONITORING, ED  CBG MONITORING, ED   ____________________________________________  EKG  Reviewed and entered by me at 2000 Heart rate 95 Cures 100 QTc 440 Normal sinus rhythm, slight J-point  elevation indicative of likely early repolarization.  No convexities or ST elevation to suggest acute STEMI ____________________________________________  RADIOLOGY  Ct Head Wo Contrast  Result Date: 02/26/2018 CLINICAL DATA:  Worsening generalized weakness and fluctuating blood sugars all week. Confusion. EXAM: CT HEAD WITHOUT CONTRAST TECHNIQUE: Contiguous axial images were obtained from the base of the skull through the vertex without intravenous contrast. COMPARISON:  06/29/2016 FINDINGS: Brain: Age related involutional changes of the brain. Chronic left frontal infarct with encephalomalacia. Chronic patchy small vessel ischemic disease of periventricular white matter and centra semiovale left greater than right. Remote infarct right caudate head. No acute intracranial hemorrhage, midline shift or edema. No new large vascular territory infarct. No intra-axial mass nor extra-axial fluid. Midline fourth ventricle basal cisterns. Brainstem and cerebellum are nonacute. Vascular: Marked atherosclerosis of the carotid siphons bilaterally. Skull: Intact Sinuses/Orbits: No acute finding. Other: None. IMPRESSION: Mild superficial atrophy with chronic left frontal lobe infarct associated with encephalomalacia. Bilateral mild to moderate chronic small vessel ischemic disease and chronic right caudate head lacunar infarct. No acute intracranial appearing abnormality. Electronically Signed   By: Ashley Royalty M.D.   On: 02/26/2018 20:50   Dg Chest Port 1 View  Result Date: 02/26/2018 CLINICAL DATA:  Altered mental status EXAM: PORTABLE CHEST 1 VIEW COMPARISON:  06/29/2016 FINDINGS: Normal heart size. Lungs clear. No pneumothorax. No pleural effusion. IMPRESSION: No active disease. Electronically Signed   By: Marybelle Killings M.D.   On: 02/26/2018 20:43   ____________________________________________   PROCEDURES  Procedure(s) performed: None  Procedures  Critical Care performed: Yes, see critical care  note(s)  CRITICAL CARE Performed by: Delman Kitten   Total critical care time: 37 minutes  Critical care time was exclusive of separately billable procedures and treating other patients.  Critical care was necessary to treat or prevent imminent or life-threatening deterioration.  Critical care was time spent personally by me on the following activities: development of treatment plan with patient and/or surrogate as well as nursing, discussions with consultants, evaluation of patient's  response to treatment, examination of patient, obtaining history from patient or surrogate, ordering and performing treatments and interventions, ordering and review of laboratory studies, ordering and review of radiographic studies, pulse oximetry and re-evaluation of patient's condition.  ____________________________________________   INITIAL IMPRESSION / ASSESSMENT AND PLAN / ED COURSE  Pertinent labs & imaging results that were available during my care of the patient were reviewed by me and considered in my medical decision making (see chart for details).   Patient presents for generalized weakness, some confusion and alteration in mental status worsening over the last day as well.  No focal abnormalities on examination but globally seems fatigued slightly confused.  Slightly encephalopathic picture.  Denies pain, vital signs notable for hypotension on arrival, after fluid resuscitation blood pressure is improving, currently 107/92 at 8:45 PM Patient given 1 amp of D50, resultant blood sugar lower unexpectedly at 31, repeat additional amp of D50 and will start on D10.  Differential diagnosis very broad, not currently taking any oral sulfonylureas and I do not believe there is indication for octreotide dosing at this time.  Continue fluid resuscitation, IV dextrose, monitoring blood sugars closely.  Point-of-care glucose is demonstrating low, initial BMP was drawn through a line that had just had dextrose  through it so I think this explains the hyperglycemia noted on initial BMP draw.  After 2 doses of IV dextrose and starting on D10, patient's blood sugar now elevated, I have discontinued D10.  Discussed with hospitalist, urinalysis is pending, patient will be admitted for further work-up at this time.  Appears likely severe dehydration, hypoglycemia, needing further work-up for further etiology.  CBC normal, no fever both arguing against infectious etiology, but await urinalysis at time of admission.  Dr. Jannifer Franklin admitting for further care.          ____________________________________________   FINAL CLINICAL IMPRESSION(S) / ED DIAGNOSES  Final diagnoses:  Hypoglycemia  AKI (acute kidney injury) ([place])  Lactic acid acidosis        Note:  This document was prepared using Dragon voice recognition software and may include unintentional dictation errors       Delman Kitten, MD 02/27/18 0041

## 2018-02-26 NOTE — ED Notes (Signed)
Lab at bedside

## 2018-02-26 NOTE — ED Notes (Signed)
Pt back from CT

## 2018-02-27 ENCOUNTER — Inpatient Hospital Stay: Payer: Medicare Other

## 2018-02-27 DIAGNOSIS — N390 Urinary tract infection, site not specified: Secondary | ICD-10-CM | POA: Diagnosis present

## 2018-02-27 LAB — URINALYSIS, ROUTINE W REFLEX MICROSCOPIC
Bilirubin Urine: NEGATIVE
Glucose, UA: >=500 mg/dL — AB
Ketones, ur: 5 mg/dL — AB
Nitrite: NEGATIVE
Protein, ur: 100 mg/dL — AB
Specific Gravity, Urine: 1.015 (ref 1.005–1.030)
WBC, UA: >50 WBC/hpf — ABNORMAL HIGH (ref 0–5)
pH: 5 (ref 5.0–8.0)

## 2018-02-27 LAB — GLUCOSE, CAPILLARY
Glucose-Capillary: 534 mg/dL (ref 70–99)
Glucose-Capillary: 545 mg/dL (ref 70–99)
Glucose-Capillary: 364 mg/dL — ABNORMAL HIGH (ref 70–99)
Glucose-Capillary: 74 mg/dL (ref 70–99)
Glucose-Capillary: 94 mg/dL (ref 70–99)
Glucose-Capillary: 136 mg/dL — ABNORMAL HIGH (ref 70–99)
Glucose-Capillary: 309 mg/dL — ABNORMAL HIGH (ref 70–99)

## 2018-02-27 LAB — CBC
HCT: 48.8 % (ref 39.0–52.0)
Hemoglobin: 15.9 g/dL (ref 13.0–17.0)
MCH: 31.4 pg (ref 26.0–34.0)
MCHC: 32.6 g/dL (ref 30.0–36.0)
MCV: 96.4 fL (ref 80.0–100.0)
Platelets: 204 10*3/uL (ref 150–400)
RBC: 5.06 MIL/uL (ref 4.22–5.81)
RDW: 11.5 % (ref 11.5–15.5)
WBC: 6.7 10*3/uL (ref 4.0–10.5)
nRBC: 0 % (ref 0.0–0.2)

## 2018-02-27 LAB — C-REACTIVE PROTEIN: CRP: 1.1 mg/dL — ABNORMAL HIGH (ref <1.0)

## 2018-02-27 LAB — BASIC METABOLIC PANEL
Anion gap: 12 (ref 5–15)
BUN: 57 mg/dL — ABNORMAL HIGH (ref 6–20)
CO2: 21 mmol/L — ABNORMAL LOW (ref 22–32)
Calcium: 8.6 mg/dL — ABNORMAL LOW (ref 8.9–10.3)
Chloride: 100 mmol/L (ref 98–111)
Creatinine, Ser: 3.42 mg/dL — ABNORMAL HIGH (ref 0.61–1.24)
GFR calc Af Amer: 21 mL/min — ABNORMAL LOW (ref >60)
GFR calc non Af Amer: 18 mL/min — ABNORMAL LOW (ref >60)
Glucose, Bld: 204 mg/dL — ABNORMAL HIGH (ref 70–99)
Potassium: 4.7 mmol/L (ref 3.5–5.1)
Sodium: 133 mmol/L — ABNORMAL LOW (ref 135–145)

## 2018-02-27 LAB — C DIFFICILE QUICK SCREEN W PCR REFLEX
C Diff antigen: POSITIVE — AB
C Diff toxin: NEGATIVE

## 2018-02-27 LAB — PROTEIN / CREATININE RATIO, URINE
Creatinine, Urine: 274 mg/dL
Protein Creatinine Ratio: 0.95 mg/mg{Cre} — ABNORMAL HIGH (ref 0.00–0.15)
Total Protein, Urine: 260 mg/dL

## 2018-02-27 LAB — PSA: Prostatic Specific Antigen: 2.69 ng/mL (ref 0.00–4.00)

## 2018-02-27 LAB — CLOSTRIDIUM DIFFICILE BY PCR, REFLEXED: Toxigenic C. Difficile by PCR: NEGATIVE

## 2018-02-27 LAB — SEDIMENTATION RATE: Sed Rate: 34 mm/hr — ABNORMAL HIGH (ref 0–20)

## 2018-02-27 LAB — TSH: TSH: 0.602 u[IU]/mL (ref 0.350–4.500)

## 2018-02-27 MED ORDER — VITAMIN B-1 100 MG PO TABS
100.0000 mg | ORAL_TABLET | Freq: Every day | ORAL | Status: DC
  Administered 2018-02-27 – 2018-03-04 (×6): 100 mg via ORAL
  Filled 2018-02-27 (×6): qty 1

## 2018-02-27 MED ORDER — INSULIN GLARGINE 100 UNIT/ML ~~LOC~~ SOLN
12.0000 [IU] | Freq: Every day | SUBCUTANEOUS | Status: DC
  Administered 2018-02-27: 12 [IU] via SUBCUTANEOUS
  Filled 2018-02-27: qty 0.12

## 2018-02-27 MED ORDER — VANCOMYCIN HCL IN DEXTROSE 750-5 MG/150ML-% IV SOLN
750.0000 mg | Freq: Once | INTRAVENOUS | Status: AC
  Administered 2018-02-27: 03:00:00 750 mg via INTRAVENOUS
  Filled 2018-02-27: qty 150

## 2018-02-27 MED ORDER — SODIUM CHLORIDE 0.9 % IV SOLN
1.0000 g | INTRAVENOUS | Status: DC
  Administered 2018-02-27 – 2018-03-01 (×3): 1 g via INTRAVENOUS
  Filled 2018-02-27 (×2): qty 1
  Filled 2018-02-27: qty 10
  Filled 2018-02-27: qty 1

## 2018-02-27 MED ORDER — ADULT MULTIVITAMIN W/MINERALS CH
1.0000 | ORAL_TABLET | Freq: Every day | ORAL | Status: DC
  Administered 2018-02-27 – 2018-03-04 (×5): 1 via ORAL
  Filled 2018-02-27 (×6): qty 1

## 2018-02-27 MED ORDER — INSULIN ASPART 100 UNIT/ML ~~LOC~~ SOLN: 5.0000 [IU] | Freq: Once | SUBCUTANEOUS | Status: DC

## 2018-02-27 MED ORDER — VANCOMYCIN HCL IN DEXTROSE 750-5 MG/150ML-% IV SOLN
750.0000 mg | INTRAVENOUS | Status: DC
  Filled 2018-02-27: qty 150

## 2018-02-27 MED ORDER — NEPRO/CARBSTEADY PO LIQD
237.0000 mL | Freq: Two times a day (BID) | ORAL | Status: DC
  Administered 2018-02-28 – 2018-03-03 (×4): 237 mL via ORAL

## 2018-02-27 MED ORDER — FOLIC ACID 1 MG PO TABS
1.0000 mg | ORAL_TABLET | Freq: Every day | ORAL | Status: DC
  Administered 2018-02-27 – 2018-03-04 (×6): 1 mg via ORAL
  Filled 2018-02-27 (×6): qty 1

## 2018-02-27 MED ORDER — SODIUM CHLORIDE 0.9 % IV SOLN
500.0000 mg | Freq: Two times a day (BID) | INTRAVENOUS | Status: DC
  Administered 2018-02-27: 500 mg via INTRAVENOUS
  Filled 2018-02-27 (×3): qty 0.5

## 2018-02-27 MED ORDER — INSULIN ASPART 100 UNIT/ML ~~LOC~~ SOLN
0.0000 [IU] | Freq: Three times a day (TID) | SUBCUTANEOUS | Status: DC
  Administered 2018-02-27: 08:00:00 9 [IU] via SUBCUTANEOUS
  Administered 2018-02-27: 17:00:00 1 [IU] via SUBCUTANEOUS
  Administered 2018-02-28: 13:00:00 8 [IU] via SUBCUTANEOUS
  Administered 2018-02-28 – 2018-03-01 (×2): 9 [IU] via SUBCUTANEOUS
  Administered 2018-03-01: 2 [IU] via SUBCUTANEOUS
  Administered 2018-03-01 – 2018-03-02 (×2): 3 [IU] via SUBCUTANEOUS
  Administered 2018-03-02 (×2): 9 [IU] via SUBCUTANEOUS
  Filled 2018-02-27 (×10): qty 1

## 2018-02-27 MED ORDER — INSULIN ASPART 100 UNIT/ML ~~LOC~~ SOLN: 0.0000 [IU] | Freq: Every day | SUBCUTANEOUS | Status: DC

## 2018-02-27 MED ORDER — INSULIN ASPART 100 UNIT/ML ~~LOC~~ SOLN
0.0000 [IU] | Freq: Every day | SUBCUTANEOUS | Status: AC
  Administered 2018-02-27: 06:00:00 5 [IU] via SUBCUTANEOUS
  Filled 2018-02-27: qty 1

## 2018-02-27 NOTE — Consult Note (Signed)
Date: 02/27/2018                  Patient Name:  [name]  MRN: [id number]  DOB: 02/07/1961  Age / Sex: 57 y.o., male         PCP: [name], MD                 Service Requesting Consult: IM/ Loletha Grayer, MD                 Reason for Consult: ARF            History of Present Illness: Patient is a 57 y.o. male with medical problems of diabetes, hypertension, GERD, chronic kidney disease, stroke, who was admitted to [facility] on 02/26/2018 for evaluation of generalized weakness.  Patient states that 10 days ago, he was told by his physician to discontinue taking insulin.  He has been confused at home.  Reports that he is unable to walk.  He states he has history of stroke and right-sided weakness at baseline but is able to walk with a walker.  In the emergency room, he was not able to answer questions appropriately therefore he is admitted for further evaluation and management.  Patient also reports that he has lost about 40 pounds in the last few months.  Medications: Outpatient medications: Medications Prior to Admission  Medication Sig Dispense Refill Last Dose  . lisinopril (PRINIVIL,ZESTRIL) 10 MG tablet Take 10 mg by mouth daily.  1 unknown at unknown  . sertraline (ZOLOFT) 50 MG tablet Take 50 mg by mouth daily.   1 unknown at unknown  . Multiple Vitamin (MULTIVITAMIN WITH MINERALS) TABS tablet Take 1 tablet by mouth daily. (Patient not taking: Reported on 01/15/2018) 30 tablet 5 Not Taking at Unknown time    Current medications: Current Facility-Administered Medications  Medication Dose Route Frequency Provider Last Rate Last Dose  . 0.9 %  sodium chloride infusion   Intravenous Continuous Lance Coon, MD   Stopped at 02/27/18 0244  . acetaminophen (TYLENOL) tablet 650 mg  650 mg Oral Q6H PRN Lance Coon, MD       Or  . acetaminophen (TYLENOL) suppository 650 mg  650 mg Rectal Q6H PRN Lance Coon, MD      . heparin injection 5,000 Units  5,000 Units  Subcutaneous Camelia Phenes Lance Coon, MD   5,000 Units at 02/27/18 0536  . insulin aspart (novoLOG) injection 0-9 Units  0-9 Units Subcutaneous TID WC Harrie Foreman, MD   9 Units at 02/27/18 [id number]  . insulin glargine (LANTUS) injection 12 Units  12 Units Subcutaneous QHS Harrie Foreman, MD   12 Units at 02/27/18 [id number]  . meropenem (MERREM) 500 mg in sodium chloride 0.9 % 100 mL IVPB  500 mg Intravenous Q12H Lance Coon, MD   Stopped at 02/27/18 0241  . ondansetron (ZOFRAN) tablet 4 mg  4 mg Oral Q6H PRN Lance Coon, MD       Or  . ondansetron [facility]) injection 4 mg  4 mg Intravenous Q6H PRN Lance Coon, MD      . sertraline (ZOLOFT) tablet 50 mg  50 mg Oral Daily Lance Coon, MD   50 mg at 02/27/18 1017  . vancomycin (VANCOCIN) IVPB 750 mg/150 ml premix  750 mg Intravenous Q48H Lance Coon, MD          Allergies: Allergies  Allergen Reactions  . Ferrous Gluconate Nausea And Vomiting      Past  Medical History: Past Medical History:  Diagnosis Date  . Chronic kidney disease   . Diabetes mellitus without complication ([place])   . ETOH abuse   . Hyperlipidemia   . Hypertension   . Stroke [facility])      Past Surgical History: Past Surgical History:  Procedure Laterality Date  . ESOPHAGOGASTRODUODENOSCOPY (EGD) WITH PROPOFOL N/A 07/01/2016   Procedure: ESOPHAGOGASTRODUODENOSCOPY (EGD) WITH PROPOFOL;  Surgeon: San Jetty, MD;  Location: [facility] ENDOSCOPY;  Service: General;  Laterality: N/A;     Family History: Family History  Problem Relation Age of Onset  . CAD Brother   . Dementia Mother   . Renal Disease Father      Social History: Social History   Socioeconomic History  . Marital status: Married    Spouse name: Not on file  . Number of children: Not on file  . Years of education: Not on file  . Highest education level: Not on file  Occupational History  . Not on file  Social Needs  . Financial resource strain: Not hard at all  . Food insecurity:     Worry: Never true    Inability: Never true  . Transportation needs:    Medical: No    Non-medical: No  Tobacco Use  . Smoking status: Current Every Day Smoker    Packs/day: 0.25    Years: 5.00    Pack years: 1.25    Types: Cigars  . Smokeless tobacco: Never Used  Substance and Sexual Activity  . Alcohol use: Yes    Alcohol/week: 10.0 standard drinks    Types: 8 Cans of beer, 2 Shots of liquor per week  . Drug use: No  . Sexual activity: Yes  Lifestyle  . Physical activity:    Days per week: 5 days    Minutes per session: 30 min  . Stress: Not at all  Relationships  . Social connections:    Talks on phone: Once a week    Gets together: Once a week    Attends religious service: Never    Active member of club or organization: No    Attends meetings of clubs or organizations: Never    Relationship status: Married  . Intimate partner violence:    Fear of current or ex partner: No    Emotionally abused: No    Physically abused: No    Forced sexual activity: No  Other Topics Concern  . Not on file  Social History Narrative   Lives with wife, works at [employer]: Limited due to patient's mental status Gen: Denies fevers, chills, reports significant weight loss HEENT: No vision or hearing problems CV: No chest pain or shortness of breath Resp: No cough, hemoptysis or sputum production GI: Appetite is poor, no nausea, vomiting, diarrhea reported GU : Denies any blood in the urine, denies any problems with voiding MS: Difficulty walking due to weakness Derm: Denies complaints Psych:Denies complaints Heme: Denies complaints Neuro: Denies complaints Endocrine:Denies complaints  Vital Signs: Blood pressure 112/63, pulse 78, temperature 97.7 F (36.5 C), temperature source Oral, resp. rate 16, height 6\' 1"  (1.854 m), weight 56.7 kg, SpO2 100 %.   Intake/Output Summary (Last 24 hours) at 02/27/2018 1102 Last data filed at 02/27/2018 0742 Gross per  24 hour  Intake 1481.76 ml  Output 500 ml  Net 981.76 ml    Weight trends: Filed Weights   02/26/18 1945 02/26/18 2326  Weight: 49.9 kg 56.7 kg  Physical Exam: General:  No acute distress, laying in the bed, thin, cachectic  HEENT  anicteric, moist oral mucous membranes  Neck:  Supple, no masses  Lungs:  Normal breathing effort, clear to auscultation  Heart::  No rub  Abdomen:  Soft, nontender  Extremities:  No peripheral edema  Neurologic:  Lethargic but able to answer a few simple questions  Skin:  No acute rashes    Lab results: Basic Metabolic Panel: Recent Labs  Lab 02/26/18 1954 02/26/18 2147 02/27/18 0849  NA 129* 130* 133*  K 5.0 5.1 4.7  CL 91* 97* 100  CO2 23 21* 21*  GLUCOSE 548* 441* 204*  BUN 62* 57* 57*  CREATININE 4.62* 4.24* 3.42*  CALCIUM 8.5* 8.3* 8.6*    Liver Function Tests: Recent Labs  Lab 02/26/18 1954  AST 59*  ALT 30  ALKPHOS 110  BILITOT 1.2  PROT 6.8  ALBUMIN 3.1*   Recent Labs  Lab 02/26/18 1954  LIPASE 27   No results for input(s): AMMONIA in the last 168 hours.  CBC: Recent Labs  Lab 02/26/18 1954 02/27/18 0849  WBC 7.1 6.7  NEUTROABS 4.6  --   HGB 15.8 15.9  HCT 47.6 48.8  MCV 96.4 96.4  PLT 183 204    Cardiac Enzymes: No results for input(s): CKTOTAL, TROPONINI in the last 168 hours.  BNP: Invalid input(s): POCBNP  CBG: Recent Labs  Lab 02/26/18 2141 02/26/18 2339 02/27/18 0403 02/27/18 0524 02/27/18 0738  GLUCAP 292* 324* 534* 545* 364*    Microbiology: Recent Results (from the past 720 hour(s))  Blood Culture (routine x 2)     Status: None (Preliminary result)   Collection Time: 02/26/18  7:54 PM  Result Value Ref Range Status   Specimen Description BLOOD LEFT ANTECUBITAL  Final   Special Requests   Final    BOTTLES DRAWN AEROBIC AND ANAEROBIC Blood Culture adequate volume   Culture   Final    NO GROWTH < 12 HOURS Performed at [facility], [address] [place],  [place] [address]    Report Status PENDING  Incomplete  Blood Culture (routine x 2)     Status: None (Preliminary result)   Collection Time: 02/26/18  7:59 PM  Result Value Ref Range Status   Specimen Description BLOOD BLOOD LEFT HAND  Final   Special Requests   Final    BOTTLES DRAWN AEROBIC AND ANAEROBIC Blood Culture results may not be optimal due to an inadequate volume of blood received in culture bottles   Culture   Final    NO GROWTH < 12 HOURS Performed at [facility], [address] [place], [place] [address]    Report Status PENDING  Incomplete  C difficile quick scan w PCR reflex     Status: Abnormal   Collection Time: 02/27/18  2:22 AM  Result Value Ref Range Status   C Diff antigen POSITIVE (A) NEGATIVE Final   C Diff toxin NEGATIVE NEGATIVE Final   C Diff interpretation Results are indeterminate. See PCR results.  Final    Comment: Performed at [facility], [address] [place],  [address]  C. Diff by PCR, Reflexed     Status: None   Collection Time: 02/27/18  2:22 AM  Result Value Ref Range Status   Toxigenic C. Difficile by PCR NEGATIVE NEGATIVE Final    Comment: Patient is colonized with non toxigenic C. difficile. May not need treatment unless significant symptoms are present. Performed  at [facility], [address] [place], [place] [address]      Coagulation Studies: Recent Labs    02/26/18 1954  LABPROT 12.4  INR 0.93    Urinalysis: Recent Labs    02/27/18 0007  COLORURINE AMBER*  LABSPEC 1.015  PHURINE 5.0  GLUCOSEU >=500*  HGBUR SMALL*  BILIRUBINUR NEGATIVE  KETONESUR 5*  PROTEINUR 100*  NITRITE NEGATIVE  LEUKOCYTESUR LARGE*        Imaging: Ct Head Wo Contrast  Result Date: 02/26/2018 CLINICAL DATA:  Worsening generalized weakness and fluctuating blood sugars all week. Confusion. EXAM: CT HEAD WITHOUT CONTRAST TECHNIQUE: Contiguous axial images were obtained from the base of the skull through the  vertex without intravenous contrast. COMPARISON:  06/29/2016 FINDINGS: Brain: Age related involutional changes of the brain. Chronic left frontal infarct with encephalomalacia. Chronic patchy small vessel ischemic disease of periventricular white matter and centra semiovale left greater than right. Remote infarct right caudate head. No acute intracranial hemorrhage, midline shift or edema. No new large vascular territory infarct. No intra-axial mass nor extra-axial fluid. Midline fourth ventricle basal cisterns. Brainstem and cerebellum are nonacute. Vascular: Marked atherosclerosis of the carotid siphons bilaterally. Skull: Intact Sinuses/Orbits: No acute finding. Other: None. IMPRESSION: Mild superficial atrophy with chronic left frontal lobe infarct associated with encephalomalacia. Bilateral mild to moderate chronic small vessel ischemic disease and chronic right caudate head lacunar infarct. No acute intracranial appearing abnormality. Electronically Signed   By: Ashley Royalty M.D.   On: 02/26/2018 20:50   Dg Chest Port 1 View  Result Date: 02/26/2018 CLINICAL DATA:  Altered mental status EXAM: PORTABLE CHEST 1 VIEW COMPARISON:  06/29/2016 FINDINGS: Normal heart size. Lungs clear. No pneumothorax. No pleural effusion. IMPRESSION: No active disease. Electronically Signed   By: Marybelle Killings M.D.   On: 02/26/2018 20:43      Assessment & Plan: Pt is a 57 y.o. African-American  male with medical problems of diabetes, hypertension, GERD, chronic kidney disease, stroke, was admitted on 02/26/2018 with generalized weakness and altered mental status.   -Acute kidney injury on chronic kidney disease stage II -Urinary tract infection -Proteinuria -Diabetes type 2 with CKD.  Hemoglobin A1c 5.7% on January 15, 2018 -Abnormal weight loss  Baseline creatinine appears to be 1.33/GFR greater than 60 from January 16, 2018 Presenting creatinine 4.62 Albumin mildly low at 3.1, hemoglobin elevated at 15.8 (?   Hemoconcentration) Serum creatinine today has improved slightly to 4.24 with IV hydration.  Underlying cause is unclear but with poorly controlled blood sugars, concurrent UTI, may be related to infection Antibiotics as per primary team We will obtain urine protein to creatinine ratio, SPEP, free light ratio, TSH      LOS: [place] 10/17/201911:02 AM  [place], [place]  Note: This note was prepared with Dragon dictation. Any transcription errors are unintentional

## 2018-02-27 NOTE — Progress Notes (Signed)
Initial Nutrition Assessment  DOCUMENTATION CODES:   Severe malnutrition in context of chronic illness  INTERVENTION:   Pt likely at high refeeding risk; recommend monitor K, Mg and P labs until stable.   Nepro Shake po BID, each supplement provides 425 kcal and 19 grams protein  MVI, thiamine and folic acid in setting of etoh abuse   Liberalize diet   NUTRITION DIAGNOSIS:   Severe Malnutrition related to chronic illness(uncontrolled DM, etoh abuse ) as evidenced by severe fat depletion, severe muscle depletion.  GOAL:   Patient will meet greater than or equal to 90% of their needs  MONITOR:   PO intake, Supplement acceptance, Labs, Weight trends, Skin, I & O's  REASON FOR ASSESSMENT:   Malnutrition Screening Tool    ASSESSMENT:   57 y.o. male with medical problems of diabetes, hypertension, GERD, chronic kidney disease, stroke, who was admitted to [facility] on 02/26/2018 for evaluation of generalized weakness.   Visited pt's room today. Pt working with PT at time of RD visit but reports poor appetite and oral intake pta. Pt's lunch tray was on his side table with only bites eaten from it. Pt reports his UBW 170 lbs. Patient was previously around 175lbs about 1 1/2 years ago but lost 47 lbs (27.4%) over about one year. Pt has been weight stable since his last admit in April. RD will add supplements and vitamins to help pt meet his estimated needs. Pt likely at high refeeding risk; recommend monitor K, Mg and P labs until stable.   Medications reviewed and include: heparin, insulin, NaCl @100ml /hr, ceftriaxone   Labs reviewed: Na 133(L), BUN 57(H), creat 3.42(H) Cbcs- 534, 545, 364, 74, 74 x 24 hrs  NUTRITION - FOCUSED PHYSICAL EXAM    Most Recent Value  Orbital Region  Moderate depletion  Upper Arm Region  Severe depletion  Thoracic and Lumbar Region  Severe depletion  Buccal Region  Moderate depletion  Temple Region  Moderate depletion  Clavicle Bone Region  Severe  depletion  Clavicle and Acromion Bone Region  Severe depletion  Scapular Bone Region  Moderate depletion  Dorsal Hand  Severe depletion  Patellar Region  Severe depletion  Anterior Thigh Region  Severe depletion  Posterior Calf Region  Severe depletion  Edema (RD Assessment)  None  Hair  Reviewed  Eyes  Reviewed  Mouth  Reviewed  Skin  Reviewed  Nails  Reviewed     Diet Order:   Diet Order            Diet heart healthy/carb modified Room service appropriate? Yes; Fluid consistency: Thin  Diet effective now             EDUCATION NEEDS:   Education needs have been addressed  Skin:  Skin Assessment: Reviewed RN Assessment  Last BM:  10/17- type 7  Height:   Ht Readings from Last 1 Encounters:  02/26/18 6\' 1"  (1.854 m)    Weight:   Wt Readings from Last 1 Encounters:  02/26/18 56.7 kg    Ideal Body Weight:  83.6 kg  BMI:  Body mass index is 16.48 kg/m.  Estimated Nutritional Needs:   Kcal:  2000-2300kcal/day   Protein:  85-97g/day   Fluid:  >1.7L/day   Koleen Distance MS, RD, LDN Pager #- [phone] Office#- [phone] After Hours Pager: [phone]

## 2018-02-27 NOTE — Evaluation (Signed)
**Note Reginald-Identified via Obfuscation** Physical Therapy Evaluation Patient Details Name: [name] MRN: [id number] DOB: 1960/06/27 Today's Date: 02/27/2018   History of Present Illness  Pt is a 57 y.o male presenting with uncontrolled DM, hypoglycemia, lactic acid acidosis, and AKI. Pt reports being told to stop using insulin and he arrived to the ER with labile blood sugars reaching as high as the 500's. PMH significant for CVA, HTN, DM, and ETOH abuse.   Clinical Impression  Pt presents with the above diagnosis, alert and oriented upon arrival willing to participate in PT. Initially pt indicating he cannot walk and will fall if he does. He reports 5 strokes over the last ten years, 5 falls in the last month, and shaking of his R UE frequently which causes him to fall. Pt reports he has a part time job 6 hour shifts at [employer] and frequently has to sit due to fatigue. Pt is currently having labile blood sugar however in the 70's prior to evaluation. Pt demonstrating modified independence with basic bed mobility and only CGA needed for safety with transfers. Pt is not eager to ambulate and very fearful of falling however able to ambulate 50 feet safely with no LOB, steady use of RW, normal gait pattern, however reports lightheadedness and dizziness due to a feeling of low blood sugar. RN notified of blood sugar concerns. HR remaining in 90's t/o, SpO2 remaining in mid 90's t/o. Pt is currently a high falls risk and would benefit from HHPT to improve strength and safety in home environment and in community for long term independence.     Follow Up Recommendations Home health PT    Equipment Recommendations       Recommendations for Other Services       Precautions / Restrictions Precautions Precautions: Fall      Mobility  Bed Mobility Overal bed mobility: Modified Independent             General bed mobility comments: Increased time and effort required for bed mobility but no physical assist required.    Transfers Overall transfer level: Needs assistance Equipment used: Rolling walker (2 wheeled) Transfers: Sit to/from Stand Sit to Stand: Min guard         General transfer comment: No physical assist required x2, min gaurd for safety, no LOB upon standing, increased effort required to perform transfer  Ambulation/Gait Ambulation/Gait assistance: Min guard Gait Distance (Feet): 50 Feet Assistive device: Rolling walker (2 wheeled)       General Gait Details: Pt fearful of falling but showing no signs of LOB following 50 feet with RW. R knee hyperextension noted during each step, slow cadence, not overly reliant on RW, fatigued and lightheaded requiring to sit.    Stairs            Wheelchair Mobility    Modified Rankin (Stroke Patients Only)       Balance Overall balance assessment: Mild deficits observed, not formally tested                                           Pertinent Vitals/Pain Pain Assessment: No/denies pain    Home Living Family/patient expects to be discharged to:: Private residence Living Arrangements: Spouse/significant other   Type of Home: House Home Access: Stairs to enter Entrance Stairs-Rails: None Entrance Stairs-Number of Steps: 1 Home Layout: One level Home Equipment: Environmental consultant - 2 wheels;Cane - single  point      Prior Function Level of Independence: Independent with assistive device(s)         Comments: Pt reports he works part time at [employer] 6 hour shifts, occasionally uses a RW or cane, however he has had 5 falls in the last month.      Hand Dominance        Extremity/Trunk Assessment   Upper Extremity Assessment Upper Extremity Assessment: Generalized weakness(R UE sided weakness grossly >3+/5 compared to L UE WFL  )    Lower Extremity Assessment Lower Extremity Assessment: (R LE grossly 3+/5 as compared to L LE [facility])       Communication   Communication: No difficulties  Cognition  Arousal/Alertness: Awake/alert Behavior During Therapy: WFL for tasks assessed/performed Overall Cognitive Status: Difficult to assess                                 General Comments: Pt with incongruencies between reported functional limitations and recent level of ambulation/activity (ambulating with no AD frequently and working).       General Comments      Exercises     Assessment/Plan    PT Assessment Patient needs continued PT services  PT Problem List Decreased strength;Decreased activity tolerance;Decreased balance;Decreased mobility;Decreased safety awareness;Decreased knowledge of use of DME       PT Treatment Interventions DME instruction;Gait training;Functional mobility training;Therapeutic activities;Therapeutic exercise;Patient/family education;Balance training;Modalities    PT Goals (Current goals can be found in the Care Plan section)  Acute Rehab PT Goals Patient Stated Goal: to get back to normal living and prevent falls PT Goal Formulation: With patient Time For Goal Achievement: 03/13/18 Potential to Achieve Goals: Good    Frequency Min 2X/week   Barriers to discharge Decreased caregiver support      Co-evaluation               AM-PAC PT "6 Clicks" Daily Activity  Outcome Measure Difficulty turning over in bed (including adjusting bedclothes, sheets and blankets)?: None Difficulty moving from lying on back to sitting on the side of the bed? : A Little Difficulty sitting down on and standing up from a chair with arms (e.g., wheelchair, bedside commode, etc,.)?: A Little Help needed moving to and from a bed to chair (including a wheelchair)?: A Little Help needed walking in hospital room?: A Little Help needed climbing 3-5 steps with a railing? : A Little 6 Click Score: 19    End of Session Equipment Utilized During Treatment: Gait belt Activity Tolerance: Patient tolerated treatment well Patient left: in bed;with bed alarm  set;with call bell/phone within reach Nurse Communication: Mobility status PT Visit Diagnosis: Unsteadiness on feet (R26.81);Repeated falls (R29.6);History of falling (Z91.81);Muscle weakness (generalized) (M62.81);Other abnormalities of gait and mobility (R26.89);Difficulty in walking, not elsewhere classified (R26.2)    Time: 2951-8841 PT Time Calculation (min) (ACUTE ONLY): 35 min   Charges:   PT Evaluation $PT Eval Low Complexity: 1 Low PT Treatments $Gait Training: 8-22 mins        Ernie Avena, SPT 02/27/2018, 4:45 PM

## 2018-02-27 NOTE — Progress Notes (Signed)
Inpatient Diabetes Program Recommendations  AACE/ADA: New Consensus Statement on Inpatient Glycemic Control (2019)  Target Ranges:  Prepandial:   less than 140 mg/dL      Peak postprandial:   less than 180 mg/dL (1-2 hours)      Critically ill patients:  140 - 180 mg/dL   Results for [name], [name] (MRN [id number]) as of 02/27/2018 13:51  Ref. Range 02/26/2018 19:37 02/26/2018 20:30 02/26/2018 20:51 02/26/2018 21:41 02/26/2018 23:39 02/27/2018 04:03 02/27/2018 05:24 02/27/18 06:47 02/27/2018 07:38 02/27/2018 12:28  Glucose-Capillary Latest Ref Range: 70 - 99 mg/dL 61 (L) 37 (LL)  D50 100 mls 163 (H) 292 (H) 324 (H) 534 (HH) 545 (HH)  Novolog 5 units @5 :36  Lantus 12 units @6 :47 364 (H)  Novolog 9 units @7 :52 74   Results for [name], [name] (MRN [id number]) as of 02/27/2018 13:51  Ref. Range 01/17/2014 15:51 06/30/2016 02:23 06/30/2016 17:16 08/17/2017 10:14 01/15/2018 14:56  Hemoglobin A1C Latest Ref Range: 4.8 - 5.6 % < 3.5 (L) 6.8 (H) 6.7 (H) 12.4 (H) 5.7 (H)   Review of Glycemic Control  Diabetes history: DM2 Outpatient Diabetes medications: just restarted Basaglar 20 units on 02/26/18 Current orders for Inpatient glycemic control: Lantus 12 units QHS, Novolog 0-9 units TID with meals  Endocrinologist Consult: If glucose continues to fluctuate widely, attending MD may want to call Endocrinologist to discuss patient and ask for recommendations. Please note that Endocrinologist is no longer coming to the hospital to see inpatient but Dr. Gabriel Carina is open to discussing patients with attending MD for recommendations if needed. Strongly recommend patient establish care with an Endocrinologist to help with DM management.  NOTE: In reviewing chart noted patient's A1C was 12.4% on 08/17/17 during prior hospitalization (08/16/17 to 08/21/17) with DKA. Patient was discharged on 08/21/17 on Latnus 20 units daily and Metformin 500 mg QAM. Patient admitted on 01/15/18 with hypoglycemia and A1C was noted to be 5.7%  on 01/15/18. At time of discharge on 01/16/18 all DM medications were discontinued and patient was to follow up with PCP the following day.  Spoke with patient regarding glycemic control. Patient very tearful during the conversation noting several social stress factors (lost license due to DWI, depending on family for transportation, arguing with wife and siblings, stress at work, likely going to jail due to 3rd DWI, tight finances). Provided emotional support and asked patient if he felt he needed to speak with someone and he stated yes that he would like to talk to someone about the way he is feeling and being treated. Informed patient social worker consult would be ordered. Patient reports that he has been monitoring his glucose once a day (about to run out of test strips and will need refill) and it has been 65-385 over the past 1-2 weeks. Discussed FreeStyle Libre (glucose monitoring sensor) and encouraged patient to discuss with PCP if interested as it would provide more data about glucose trends to help determine what he needs for DM control. Patient states that he stopped taking all DM medications as advised at last hospital discharge and he has not followed up with PCP yet due to lack of transportation. Patient states that he has had several episodes of hypoglycemia over the past 4 weeks in which he has fallen trying to go get something to eat or drink. Patient denies taking any DM medications since discharged on 01/16/18 up until yesterday when he took Basaglar 20 units when his glucose was up to 395 mg/dl at home. Patient reports  then his glucose dropped low and he came to the hospital. Patient's glucose has been very liable over the past 5 months as noted during current and prior admissions. Would likely benefit from Endocrinologist input to help determine why patient continues to have fluctuations in glucose (even when he was not taking any DM medications at home). Patient verbalized understanding of  information discussed and states that he has no further questions at this time.  Patient states that he does not have much Basaglar at home and if he is discharged back on insulin or any other DM medications he will need prescriptions and he would also like prescription for test strips.   Thanks, Barnie Alderman, RN, MSN, CDE Diabetes Coordinator Inpatient Diabetes Program [phone] (Team Pager from 8am to 5pm)

## 2018-02-27 NOTE — Progress Notes (Signed)
Pharmacy Antibiotic Note  [name] is a 57 y.o. male admitted on 02/26/2018 with suspected sepsis.  Pharmacy has been consulted for vancomycin and meropenem dosing.  Plan: DW 57kg  Vd 40L kei 0.017 hr-1  T1/2 41 hour Vancomycin 750 mg q 48 hours ordered with stacked dosing. Level before 3rd dose. Goal trough 15-20  Meropenem 500 mg q 12 hours ordered  Height: 6\' 1"  (185.4 cm) Weight: 124 lb 14.4 oz (56.7 kg) IBW/kg (Calculated) : 79.9  Temp (24hrs), Avg:97.6 F (36.4 C), Min:97.5 F (36.4 C), Max:97.6 F (36.4 C)  Recent Labs  Lab 02/26/18 1954 02/26/18 2147  WBC 7.1  --   CREATININE 4.62* 4.24*  LATICACIDVEN 2.6* 1.2    Estimated Creatinine Clearance: 15.2 mL/min (A) (by C-G formula based on SCr of 4.24 mg/dL (H)).    Allergies  Allergen Reactions  . Ferrous Gluconate Nausea And Vomiting    Antimicrobials this admission: Vancomycin, meropenem 10/17   >>    >>   Dose adjustments this admission:   Microbiology results: 10/16 BCx: pending 10/17 UCx: pending       10/17 UA: LE(+) NO2(-)  WBC >50 10/16 CXR: no active disease  Thank you for allowing pharmacy to be a part of this patient's care.  Jannae Fagerstrom S 02/27/2018 3:27 AM

## 2018-02-27 NOTE — Care Management (Signed)
Patient had observation stay in September 2019 at [facility] for hypoglycemia.  Had an admission in April 2019 and he informed the care manager that even though he has medication coverage with his medicare, he does not have financial resources to obtain his meds. Patient does have history of etoh and possibily does not use his financial resources wisely.Physical therapy has recommended home health physical therapy.  If patient agrees, amy also benefit from home health nurse to monitor glycemic status and diabetes management. A social worker may also be of benefit

## 2018-02-27 NOTE — Progress Notes (Signed)
[name], male   DOB: 22-Aug-1960, 57 y.o.   MRN: [id number]  [employer] PROGRESS NOTE  BRITTON BERA LPF:[id number] DOB: [birth year] DOA: 02/26/2018 PCP: [name], MD  HPI/Subjective: Patient states he has had a 40 pound weight loss.  Appetite is okay.  He states he urinates okay.  States he is very weak and can hardly walk.  States he cannot sleep.  Occasionally has blurry vision.  Objective: Vitals:   02/27/18 0407 02/27/18 1416  BP: 112/63 (!) 88/64  Pulse: 78 81  Resp: 16 18  Temp: 97.7 F (36.5 C) 98.7 F (37.1 C)  SpO2: 100% 100%    Filed Weights   02/26/18 1945 02/26/18 2326  Weight: 49.9 kg 56.7 kg    ROS: Review of Systems  Constitutional: Negative for chills and fever.  Eyes: Positive for blurred vision.  Respiratory: Negative for cough and shortness of breath.   Cardiovascular: Negative for chest pain.  Gastrointestinal: Negative for abdominal pain, constipation, diarrhea, nausea and vomiting.  Genitourinary: Negative for dysuria.  Musculoskeletal: Negative for joint pain.  Neurological: Negative for dizziness and headaches.   Exam: Physical Exam  HENT:  Nose: No mucosal edema.  Mouth/Throat: No oropharyngeal exudate or posterior oropharyngeal edema.  Eyes: Pupils are equal, round, and reactive to light. Conjunctivae, EOM and lids are normal.  Neck: No JVD present. Carotid bruit is not present. No edema present. No thyroid mass and no thyromegaly present.  Cardiovascular: S1 normal and S2 normal. Exam reveals no gallop.  No murmur heard. Pulses:      Dorsalis pedis pulses are 2+ on the right side, and 2+ on the left side.  Respiratory: No respiratory distress. He has no wheezes. He has no rhonchi. He has no rales.  GI: Soft. Bowel sounds are normal. There is no tenderness.  Musculoskeletal:       Right ankle: He exhibits no swelling.       Left ankle: He exhibits no swelling.  Lymphadenopathy:    He has no cervical  adenopathy.  Neurological: He is alert. No cranial nerve deficit.  Skin: Skin is warm. No rash noted. Nails show no clubbing.  Psychiatric: He has a normal mood and affect.      Data Reviewed: Basic Metabolic Panel: Recent Labs  Lab 02/26/18 1954 02/26/18 2147 02/27/18 0849  NA 129* 130* 133*  K 5.0 5.1 4.7  CL 91* 97* 100  CO2 23 21* 21*  GLUCOSE 548* 441* 204*  BUN 62* 57* 57*  CREATININE 4.62* 4.24* 3.42*  CALCIUM 8.5* 8.3* 8.6*   Liver Function Tests: Recent Labs  Lab 02/26/18 1954  AST 59*  ALT 30  ALKPHOS 110  BILITOT 1.2  PROT 6.8  ALBUMIN 3.1*   Recent Labs  Lab 02/26/18 1954  LIPASE 27   CBC: Recent Labs  Lab 02/26/18 1954 02/27/18 0849  WBC 7.1 6.7  NEUTROABS 4.6  --   HGB 15.8 15.9  HCT 47.6 48.8  MCV 96.4 96.4  PLT 183 204    CBG: Recent Labs  Lab 02/27/18 0403 02/27/18 0524 02/27/18 0738 02/27/18 1228 02/27/18 1443  GLUCAP 534* 545* 364* 74 94    Recent Results (from the past 240 hour(s))  Blood Culture (routine x 2)     Status: None (Preliminary result)   Collection Time: 02/26/18  7:54 PM  Result Value Ref Range Status   Specimen Description BLOOD LEFT ANTECUBITAL  Final   Special Requests   Final  BOTTLES DRAWN AEROBIC AND ANAEROBIC Blood Culture adequate volume   Culture   Final    NO GROWTH < 12 HOURS Performed at [facility], [address] [place], [place] [address]    Report Status PENDING  Incomplete  Blood Culture (routine x 2)     Status: None (Preliminary result)   Collection Time: 02/26/18  7:59 PM  Result Value Ref Range Status   Specimen Description BLOOD BLOOD LEFT HAND  Final   Special Requests   Final    BOTTLES DRAWN AEROBIC AND ANAEROBIC Blood Culture results may not be optimal due to an inadequate volume of blood received in culture bottles   Culture   Final    NO GROWTH < 12 HOURS Performed at [facility], [address] [place], [place] [address]    Report Status PENDING   Incomplete  C difficile quick scan w PCR reflex     Status: Abnormal   Collection Time: 02/27/18  2:22 AM  Result Value Ref Range Status   C Diff antigen POSITIVE (A) NEGATIVE Final   C Diff toxin NEGATIVE NEGATIVE Final   C Diff interpretation Results are indeterminate. See PCR results.  Final    Comment: Performed at [facility], [address] [place], [place] [address]  C. Diff by PCR, Reflexed     Status: None   Collection Time: 02/27/18  2:22 AM  Result Value Ref Range Status   Toxigenic C. Difficile by PCR NEGATIVE NEGATIVE Final    Comment: Patient is colonized with non toxigenic C. difficile. May not need treatment unless significant symptoms are present. Performed at [facility], [address] [place], [place] [address]      Studies: Dg Thoracic Spine 2 View  Result Date: 02/27/2018 CLINICAL DATA:  Upper back pain, no known injury, initial encounter EXAM: THORACIC SPINE 2 VIEWS COMPARISON:  09/26/2011 FINDINGS: Pedicles are within normal limits and no paraspinal mass lesion is seen. No definitive rib abnormality is noted. No compression deformities are seen. Mild osteophytic changes are noted. IMPRESSION: Mild degenerative change without acute abnormality. Electronically Signed   By: Inez Catalina M.D.   On: 02/27/2018 12:20   Ct Head Wo Contrast  Result Date: 02/26/2018 CLINICAL DATA:  Worsening generalized weakness and fluctuating blood sugars all week. Confusion. EXAM: CT HEAD WITHOUT CONTRAST TECHNIQUE: Contiguous axial images were obtained from the base of the skull through the vertex without intravenous contrast. COMPARISON:  06/29/2016 FINDINGS: Brain: Age related involutional changes of the brain. Chronic left frontal infarct with encephalomalacia. Chronic patchy small vessel ischemic disease of periventricular white matter and centra semiovale left greater than right. Remote infarct right caudate head. No acute intracranial hemorrhage, midline  shift or edema. No new large vascular territory infarct. No intra-axial mass nor extra-axial fluid. Midline fourth ventricle basal cisterns. Brainstem and cerebellum are nonacute. Vascular: Marked atherosclerosis of the carotid siphons bilaterally. Skull: Intact Sinuses/Orbits: No acute finding. Other: None. IMPRESSION: Mild superficial atrophy with chronic left frontal lobe infarct associated with encephalomalacia. Bilateral mild to moderate chronic small vessel ischemic disease and chronic right caudate head lacunar infarct. No acute intracranial appearing abnormality. Electronically Signed   By: Ashley Royalty M.D.   On: 02/26/2018 20:50   US Renal  Result Date: 02/27/2018 CLINICAL DATA:  Acute renal failure. EXAM: RENAL / URINARY TRACT ULTRASOUND COMPLETE COMPARISON:  Ultrasound of August 21, 2007.  CT scan of August 16, 2017. FINDINGS: Right Kidney: Length: 9.6 cm. Increased echogenicity  of renal parenchyma is noted. No mass or hydronephrosis visualized. Left Kidney: Length: 9.4 cm. Increased echogenicity of renal parenchyma is noted. No mass or hydronephrosis visualized. Bladder: Appears normal for degree of bladder distention. IMPRESSION: Increased echogenicity of renal parenchyma is noted bilaterally consistent with medical renal disease. No hydronephrosis or renal obstruction is noted. Electronically Signed   By: Marijo Conception, M.D.   On: 02/27/2018 12:02   Dg Chest Port 1 View  Result Date: 02/26/2018 CLINICAL DATA:  Altered mental status EXAM: PORTABLE CHEST 1 VIEW COMPARISON:  06/29/2016 FINDINGS: Normal heart size. Lungs clear. No pneumothorax. No pleural effusion. IMPRESSION: No active disease. Electronically Signed   By: Marybelle Killings M.D.   On: 02/26/2018 20:43    Scheduled Meds: . heparin  5,000 Units Subcutaneous Q8H  . insulin aspart  0-9 Units Subcutaneous TID WC  . sertraline  50 mg Oral Daily   Continuous Infusions: . sodium chloride 100 mL/hr at 02/27/18 1449  . cefTRIAXone  (ROCEPHIN)  IV      Assessment/Plan:  1. Acute kidney injury on chronic kidney disease stage III.  IV fluid hydration.  Ultrasound of the kidneys consistent with medical renal disease. 2. Hypotension.  Continue IV fluid hydration.  Hold lisinopril 3. Hypoglycemia than hyperglycemia.  Hemoglobin A1c is 5.7.  Check fingersticks q. before meals and nightly and sliding scale only.  Hold off on Lantus. 4. Acute cystitis.  Switch antibiotics to Rocephin and follow-up cultures 5. Weakness and weight loss.  Physical therapy evaluation.  Add on a PSA.  Looking over prior imaging, he had a CT scan of the abdomen pelvis in April 2019 which was negative for cancerous process 6. Depression on Zoloft 7. Check orthostatic vital signs  Code Status:     Code Status Orders  (From admission, onward)         Start     Ordered   02/26/18 2333  Full code  Continuous     02/26/18 2332        Code Status History    Date Active Date Inactive Code Status Order ID Comments User Context   01/15/2018 2158 01/16/2018 1909 Full Code [id number]  Henreitta Leber, MD Inpatient   08/17/2017 0000 08/21/2017 1532 Full Code [id number]  Lance Coon, MD ED   06/29/2016 2124 07/01/2016 1805 Full Code [id number]  Vaughan Basta, MD Inpatient      Disposition Plan: To be determined  Consultants:  Nephrology  Antibiotics:  Rocephin  Time spent: 28 minutes  [place]

## 2018-02-28 LAB — GLUCOSE, CAPILLARY
Glucose-Capillary: 552 mg/dL (ref 70–99)
Glucose-Capillary: 387 mg/dL — ABNORMAL HIGH (ref 70–99)
Glucose-Capillary: 101 mg/dL — ABNORMAL HIGH (ref 70–99)
Glucose-Capillary: 316 mg/dL — ABNORMAL HIGH (ref 70–99)

## 2018-02-28 LAB — PROTEIN ELECTROPHORESIS, SERUM
A/G Ratio: 0.9 (ref 0.7–1.7)
Albumin ELP: 2.6 g/dL — ABNORMAL LOW (ref 2.9–4.4)
Alpha-1-Globulin: 0.2 g/dL (ref 0.0–0.4)
Alpha-2-Globulin: 1 g/dL (ref 0.4–1.0)
Beta Globulin: 0.8 g/dL (ref 0.7–1.3)
Gamma Globulin: 1 g/dL (ref 0.4–1.8)
Globulin, Total: 3 g/dL (ref 2.2–3.9)
Total Protein ELP: 5.6 g/dL — ABNORMAL LOW (ref 6.0–8.5)

## 2018-02-28 LAB — BASIC METABOLIC PANEL
Anion gap: 11 (ref 5–15)
BUN: 49 mg/dL — ABNORMAL HIGH (ref 6–20)
CO2: 20 mmol/L — ABNORMAL LOW (ref 22–32)
Calcium: 8.2 mg/dL — ABNORMAL LOW (ref 8.9–10.3)
Chloride: 99 mmol/L (ref 98–111)
Creatinine, Ser: 2.53 mg/dL — ABNORMAL HIGH (ref 0.61–1.24)
GFR calc Af Amer: 31 mL/min — ABNORMAL LOW (ref >60)
GFR calc non Af Amer: 26 mL/min — ABNORMAL LOW (ref >60)
Glucose, Bld: 496 mg/dL — ABNORMAL HIGH (ref 70–99)
Potassium: 4.6 mmol/L (ref 3.5–5.1)
Sodium: 130 mmol/L — ABNORMAL LOW (ref 135–145)

## 2018-02-28 LAB — CBC
HCT: 38.1 % — ABNORMAL LOW (ref 39.0–52.0)
Hemoglobin: 12.5 g/dL — ABNORMAL LOW (ref 13.0–17.0)
MCH: 32.1 pg (ref 26.0–34.0)
MCHC: 32.8 g/dL (ref 30.0–36.0)
MCV: 97.9 fL (ref 80.0–100.0)
Platelets: 182 10*3/uL (ref 150–400)
RBC: 3.89 MIL/uL — ABNORMAL LOW (ref 4.22–5.81)
RDW: 11.5 % (ref 11.5–15.5)
WBC: 5.3 10*3/uL (ref 4.0–10.5)
nRBC: 0 % (ref 0.0–0.2)

## 2018-02-28 LAB — KAPPA/LAMBDA LIGHT CHAINS
Kappa free light chain: 90.7 mg/L — ABNORMAL HIGH (ref 3.3–19.4)
Kappa, lambda light chain ratio: 0.94 (ref 0.26–1.65)
Lambda free light chains: 96.4 mg/L — ABNORMAL HIGH (ref 5.7–26.3)

## 2018-02-28 LAB — PHOSPHORUS: Phosphorus: 3.5 mg/dL (ref 2.5–4.6)

## 2018-02-28 LAB — MAGNESIUM: Magnesium: 2.1 mg/dL (ref 1.7–2.4)

## 2018-02-28 MED ORDER — OXYCODONE HCL 5 MG PO TABS
5.0000 mg | ORAL_TABLET | Freq: Four times a day (QID) | ORAL | Status: DC | PRN
  Administered 2018-02-28 – 2018-03-03 (×4): 5 mg via ORAL
  Filled 2018-02-28 (×4): qty 1

## 2018-02-28 MED ORDER — INSULIN GLARGINE 100 UNIT/ML ~~LOC~~ SOLN
6.0000 [IU] | Freq: Two times a day (BID) | SUBCUTANEOUS | Status: DC
  Administered 2018-02-28 – 2018-03-02 (×4): 6 [IU] via SUBCUTANEOUS
  Filled 2018-02-28 (×5): qty 0.06

## 2018-02-28 MED ORDER — INSULIN GLARGINE 100 UNIT/ML ~~LOC~~ SOLN
6.0000 [IU] | Freq: Every morning | SUBCUTANEOUS | Status: DC
  Administered 2018-02-28: 6 [IU] via SUBCUTANEOUS
  Filled 2018-02-28: qty 0.06

## 2018-02-28 MED ORDER — GABAPENTIN 100 MG PO CAPS
100.0000 mg | ORAL_CAPSULE | Freq: Three times a day (TID) | ORAL | Status: DC
  Administered 2018-02-28 – 2018-03-01 (×3): 100 mg via ORAL
  Filled 2018-02-28 (×3): qty 1

## 2018-02-28 MED ORDER — MORPHINE SULFATE (PF) 2 MG/ML IV SOLN: 2.0000 mg | INTRAVENOUS | Status: DC | PRN

## 2018-02-28 MED ORDER — DRONABINOL 2.5 MG PO CAPS
2.5000 mg | ORAL_CAPSULE | Freq: Two times a day (BID) | ORAL | Status: DC
  Administered 2018-02-28 – 2018-03-04 (×8): 2.5 mg via ORAL
  Filled 2018-02-28 (×8): qty 1

## 2018-02-28 NOTE — Progress Notes (Signed)
[name], male   DOB: 05/28/1960, 57 y.o.   MRN: [id number]  [facility] PROGRESS NOTE  [name] WGN:[id number] DOB: January 03, 1961 DOA: 02/26/2018 PCP: [name], MD  HPI/Subjective: Patient states he has had a 40 pound weight loss.  Appetite is okay.  He states he urinates okay.  States he is very weak and can hardly walk.  States he cannot sleep.  Occasionally has blurry vision.  Objective: Vitals:   02/27/18 2043 02/28/18 0605  BP: 111/76 110/65  Pulse: 81 80  Resp: 16 18  Temp: 98 F (36.7 C) 97.6 F (36.4 C)  SpO2: 100% 99%    Filed Weights   02/26/18 1945 02/26/18 2326  Weight: 49.9 kg 56.7 kg    ROS: Review of Systems  Constitutional: Positive for malaise/fatigue. Negative for chills and fever.  Eyes: Positive for blurred vision.  Respiratory: Negative for cough and shortness of breath.   Cardiovascular: Negative for chest pain.  Gastrointestinal: Negative for abdominal pain, constipation, diarrhea, nausea and vomiting.  Genitourinary: Negative for dysuria.  Musculoskeletal: Negative for joint pain.  Neurological: Negative for dizziness and headaches.   Exam: Physical Exam  HENT:  Nose: No mucosal edema.  Mouth/Throat: No oropharyngeal exudate or posterior oropharyngeal edema.  Eyes: Pupils are equal, round, and reactive to light. Conjunctivae, EOM and lids are normal.  Neck: No JVD present. Carotid bruit is not present. No edema present. No thyroid mass and no thyromegaly present.  Cardiovascular: S1 normal and S2 normal. Exam reveals no gallop.  No murmur heard. Pulses:      Dorsalis pedis pulses are 2+ on the right side, and 2+ on the left side.  Respiratory: No respiratory distress. He has no wheezes. He has no rhonchi. He has no rales.  GI: Soft. Bowel sounds are normal. There is no tenderness.  Musculoskeletal:       Right ankle: He exhibits no swelling.       Left ankle: He exhibits no swelling.  Lymphadenopathy:    He  has no cervical adenopathy.  Neurological: He is alert. No cranial nerve deficit.  Skin: Skin is warm. No rash noted. Nails show no clubbing.  Psychiatric: He has a normal mood and affect.      Data Reviewed: Basic Metabolic Panel: Recent Labs  Lab 02/26/18 1954 02/26/18 2147 02/27/18 0849 02/28/18 0600  NA 129* 130* 133* 130*  K 5.0 5.1 4.7 4.6  CL 91* 97* 100 99  CO2 23 21* 21* 20*  GLUCOSE 548* 441* 204* 496*  BUN 62* 57* 57* 49*  CREATININE 4.62* 4.24* 3.42* 2.53*  CALCIUM 8.5* 8.3* 8.6* 8.2*  MG  --   --   --  2.1  PHOS  --   --   --  3.5   Liver Function Tests: Recent Labs  Lab 02/26/18 1954  AST 59*  ALT 30  ALKPHOS 110  BILITOT 1.2  PROT 6.8  ALBUMIN 3.1*   Recent Labs  Lab 02/26/18 1954  LIPASE 27   CBC: Recent Labs  Lab 02/26/18 1954 02/27/18 0849 02/28/18 0600  WBC 7.1 6.7 5.3  NEUTROABS 4.6  --   --   HGB 15.8 15.9 12.5*  HCT 47.6 48.8 38.1*  MCV 96.4 96.4 97.9  PLT 183 204 182    CBG: Recent Labs  Lab 02/27/18 1632 02/27/18 2043 02/28/18 0746 02/28/18 0748 02/28/18 1131  GLUCAP 136* 309* 568* 552* 387*    Recent Results (from the past 240 hour(s))  Blood Culture (routine x 2)     Status: None (Preliminary result)   Collection Time: 02/26/18  7:54 PM  Result Value Ref Range Status   Specimen Description BLOOD LEFT ANTECUBITAL  Final   Special Requests   Final    BOTTLES DRAWN AEROBIC AND ANAEROBIC Blood Culture adequate volume   Culture   Final    NO GROWTH 2 DAYS Performed at [facility], [address] [place], [place] [address]    Report Status PENDING  Incomplete  Blood Culture (routine x 2)     Status: None (Preliminary result)   Collection Time: 02/26/18  7:59 PM  Result Value Ref Range Status   Specimen Description BLOOD BLOOD LEFT HAND  Final   Special Requests   Final    BOTTLES DRAWN AEROBIC AND ANAEROBIC Blood Culture results may not be optimal due to an inadequate volume of blood received in  culture bottles   Culture   Final    NO GROWTH 2 DAYS Performed at [facility], [address], [place], [place] [address]    Report Status PENDING  Incomplete  Urine Culture     Status: Abnormal (Preliminary result)   Collection Time: 02/27/18 12:07 AM  Result Value Ref Range Status   Specimen Description   Final    URINE, RANDOM Performed at [facility], [address] [place], [place] [address]    Special Requests   Final    NONE Performed at [facility], [address] [place], [place] [address]    Culture (A)  Final    50,000 COLONIES/mL UNIDENTIFIED ORGANISM Performed at [facility], [place] [address], [place], [place] [address]    Report Status PENDING  Incomplete  C difficile quick scan w PCR reflex     Status: Abnormal   Collection Time: 02/27/18  2:22 AM  Result Value Ref Range Status   C Diff antigen POSITIVE (A) NEGATIVE Final   C Diff toxin NEGATIVE NEGATIVE Final   C Diff interpretation Results are indeterminate. See PCR results.  Final    Comment: Performed at [facility], [address] [place], [place] [address]  C. Diff by PCR, Reflexed     Status: None   Collection Time: 02/27/18  2:22 AM  Result Value Ref Range Status   Toxigenic C. Difficile by PCR NEGATIVE NEGATIVE Final    Comment: Patient is colonized with non toxigenic C. difficile. May not need treatment unless significant symptoms are present. Performed at [facility], [address] [place], [place] [address]      Studies: Dg Thoracic Spine 2 View  Result Date: 02/27/2018 CLINICAL DATA:  Upper back pain, no known injury, initial encounter EXAM: THORACIC SPINE 2 VIEWS COMPARISON:  09/26/2011 FINDINGS: Pedicles are within normal limits and no paraspinal mass lesion is seen. No definitive rib abnormality is noted. No compression deformities are seen. Mild osteophytic changes are noted. IMPRESSION: Mild degenerative change without acute  abnormality. Electronically Signed   By: Inez Catalina M.D.   On: 02/27/2018 12:20   Ct Head Wo Contrast  Result Date: 02/26/2018 CLINICAL DATA:  Worsening generalized weakness and fluctuating blood sugars all week. Confusion. EXAM: CT HEAD WITHOUT CONTRAST TECHNIQUE: Contiguous axial images were obtained from the base of the skull through the vertex without intravenous contrast. COMPARISON:  06/29/2016 FINDINGS: Brain: Age related involutional changes of the brain. Chronic left frontal infarct with encephalomalacia. Chronic patchy small vessel ischemic disease of periventricular  white matter and centra semiovale left greater than right. Remote infarct right caudate head. No acute intracranial hemorrhage, midline shift or edema. No new large vascular territory infarct. No intra-axial mass nor extra-axial fluid. Midline fourth ventricle basal cisterns. Brainstem and cerebellum are nonacute. Vascular: Marked atherosclerosis of the carotid siphons bilaterally. Skull: Intact Sinuses/Orbits: No acute finding. Other: None. IMPRESSION: Mild superficial atrophy with chronic left frontal lobe infarct associated with encephalomalacia. Bilateral mild to moderate chronic small vessel ischemic disease and chronic right caudate head lacunar infarct. No acute intracranial appearing abnormality. Electronically Signed   By: Ashley Royalty M.D.   On: 02/26/2018 20:50   US Renal  Result Date: 02/27/2018 CLINICAL DATA:  Acute renal failure. EXAM: RENAL / URINARY TRACT ULTRASOUND COMPLETE COMPARISON:  Ultrasound of August 21, 2007.  CT scan of August 16, 2017. FINDINGS: Right Kidney: Length: 9.6 cm. Increased echogenicity of renal parenchyma is noted. No mass or hydronephrosis visualized. Left Kidney: Length: 9.4 cm. Increased echogenicity of renal parenchyma is noted. No mass or hydronephrosis visualized. Bladder: Appears normal for degree of bladder distention. IMPRESSION: Increased echogenicity of renal parenchyma is noted  bilaterally consistent with medical renal disease. No hydronephrosis or renal obstruction is noted. Electronically Signed   By: Marijo Conception, M.D.   On: 02/27/2018 12:02   Dg Chest Port 1 View  Result Date: 02/26/2018 CLINICAL DATA:  Altered mental status EXAM: PORTABLE CHEST 1 VIEW COMPARISON:  06/29/2016 FINDINGS: Normal heart size. Lungs clear. No pneumothorax. No pleural effusion. IMPRESSION: No active disease. Electronically Signed   By: Marybelle Killings M.D.   On: 02/26/2018 20:43    Scheduled Meds: . feeding supplement (NEPRO CARB STEADY)  237 mL Oral BID BM  . folic acid  1 mg Oral Daily  . gabapentin  100 mg Oral TID  . heparin  5,000 Units Subcutaneous Q8H  . insulin aspart  0-9 Units Subcutaneous TID WC  . insulin glargine  6 Units Subcutaneous BID  . multivitamin with minerals  1 tablet Oral Daily  . sertraline  50 mg Oral Daily  . thiamine  100 mg Oral Daily   Continuous Infusions: . sodium chloride 100 mL/hr at 02/28/18 0353  . cefTRIAXone (ROCEPHIN)  IV Stopped (02/27/18 1808)    Assessment/Plan:  1. Acute kidney injury on chronic kidney disease stage III.  IV fluid hydration.  Ultrasound of the kidneys consistent with medical renal disease.  Check creatinine daily. 2. Hypotension.  Continue IV fluid hydration.  Hold lisinopril.  3. Hypoglycemia than hyperglycemia.  Hemoglobin A1c is 5.7 on 01/15/2018.  Patient is very brittle with the diabetes with sugar up above 500.  Start Lantus 6 units twice daily.  Short acting insulin prior to meals. 4. Acute cystitis.  Switch antibiotics to Rocephin and follow-up cultures 5. Weakness and weight loss.  Physical therapy evaluation.  Add on a PSA.  Looking over prior imaging, he had a CT scan of the abdomen pelvis in April 2019 which was negative for cancerous process.  Spoke with wife and she wants something for appetite.  Start Marinol low-dose 6. Depression on Zoloft 7. Weakness.  Physical therapy recommended home with home  health 8. Diabetic neuropathy started on low-dose gabapentin  Code Status:     Code Status Orders  (From admission, onward)         Start     Ordered   02/26/18 2333  Full code  Continuous     02/26/18 2332  Code Status History    Date Active Date Inactive Code Status Order ID Comments User Context   01/15/2018 2158 01/16/2018 1909 Full Code [id number]  Henreitta Leber, MD Inpatient   08/17/2017 0000 08/21/2017 1532 Full Code [id number]  Lance Coon, MD ED   06/29/2016 2124 07/01/2016 1805 Full Code [id number]  Vaughan Basta, MD Inpatient      Disposition Plan: To be determined  Consultants:  Nephrology  Antibiotics:  Rocephin  Time spent: 29 minutes, case discussed with wife on the phone  [employer]

## 2018-02-28 NOTE — Progress Notes (Signed)
Inpatient Diabetes Program Recommendations  AACE/ADA: New Consensus Statement on Inpatient Glycemic Control (2019)  Target Ranges:  Prepandial:   less than 140 mg/dL      Peak postprandial:   less than 180 mg/dL (1-2 hours)      Critically ill patients:  140 - 180 mg/dL  Results for [name], [name] (MRN [id number]) as of 02/28/2018 09:04  Ref. Range 02/27/2018 04:03 02/27/2018 05:24 02/27/18 06:47 02/27/2018 07:38 02/27/2018 12:28 02/27/2018 14:43 02/27/2018 16:32 02/27/2018 20:43 02/28/2018 07:46 02/28/2018 07:48  Glucose-Capillary Latest Ref Range: 70 - 99 mg/dL 534 (HH) 545 (HH)  Novolog 5 units    Lantus 12 units 364 (H)  Novolog 9 units 74 94 136 (H)  Novolog 1 unit 309 (H) 568 (HH) 552 (HH)  Novolog 9 units  Lantus 6 units   Results for [name], [name] (MRN [id number]) as of 02/28/2018 09:04  Ref. Range 02/26/2018 19:37 02/26/2018 20:30 02/26/2018 20:51 02/26/2018 21:41 02/26/2018 23:39  Glucose-Capillary Latest Ref Range: 70 - 99 mg/dL 61 (L) 37 (LL)  D50 100 mls 163 (H) 292 (H) 324 (H)  Results for [name], [name] (MRN [id number]) as of 02/28/2018 09:04  Ref. Range 01/17/2014 15:51 06/30/2016 02:23 06/30/2016 17:16 08/17/2017 10:14 01/15/2018 14:56  Hemoglobin A1C Latest Ref Range: 4.8 - 5.6 % < 3.5 (L) 6.8 (H) 6.7 (H) 12.4 (H) 5.7 (H)   Review of Glycemic Control  Diabetes history: DM2 Outpatient Diabetes medications: just restarted Basaglar 20 units daily on 02/26/18 Current orders for Inpatient glycemic control: Lantus 6 units QAM, Novolog 0-9 units TID with meals  Recommendations: Insulin-Basal: Noted Lantus decreased from 12 units to 6 units QAM and patient has already received it this morning.  May want to consider changing Lantus to 6 units BID (starting at bedtime tonight). Insulin-Correction: Please consider ordering Novolog 0-5 units QHS. Endocrinologist Consult:If glucose continues to fluctuate widely, attending MD may want to call Endocrinologistdirectly to discuss  patient and ask for recommendations. Please note that Endocrinologist is no longer coming to the hospital to see inpatient but Dr. Gabriel Carina is open to discussing patients with attending MD for recommendations if needed. Strongly recommend patient establish care with an Endocrinologist to help with DM management.  NOTE: In reviewing chart noted patient's A1C was 12.4% on 08/17/17 during prior hospitalization (08/16/17 to 08/21/17) with DKA. Patient was discharged on 08/21/17 on Lantus 20 units daily and Metformin 500 mg QAM. Patient admitted on 01/15/18 with hypoglycemia and A1C was noted to be 5.7% on 01/15/18. At time of discharge on 01/16/18 all DM medications were discontinued and patient was to follow up with PCP the following day. Per conversation with patient on 02/27/18, patient has not followed up with PCP since then.  Thanks, Barnie Alderman, RN, MSN, CDE Diabetes Coordinator Inpatient Diabetes Program [phone] (Team Pager from 8am to 5pm)

## 2018-02-28 NOTE — Clinical Social Work Note (Signed)
Clinical Social Work Assessment  Patient Details  Name: [name] MRN: [id number] Date of Birth: 10/26/1960  Date of referral:  02/28/18               Reason for consult:  Facility Placement                Permission sought to share information with:  Case Manager, Facility Contact Representative, Family Supports Permission granted to share information::  Yes, Verbal Permission Granted  Name::        Agency::     Relationship::     Contact Information:     Housing/Transportation Living arrangements for the past 2 months:  Single Family Home Source of Information:  Patient Patient Interpreter Needed:  None Criminal Activity/Legal Involvement Pertinent to Current Situation/Hospitalization:  No - Comment as needed Significant Relationships:  Siblings, Spouse Lives with:  Spouse Do you feel safe going back to the place where you live?  Yes Need for family participation in patient care:  No (Coment)  Care giving concerns:  Patient lives with wife in [place]    Social Worker assessment / plan:  CSW consulted for depressed mood and social factors affecting patient. CSW met with patient. Patient is alert and oriented x3. CSW introduced self and explained role. Patient has a very flat affect. CSW inquired about patient's living arrangements and he states that he lives with wife and works at [employer]. Patient would not answer other questions for CSW. He shrugged several times and grunted as answers but could not give CSW definitive answers to questions. CSW attempted to give patient resource list for [place] but patient states he does not need it. Patient states he does not need help. CSW signing off. Please re consult if further needs arise.   Employment status:  Full-Time Insurance information:  Medicare PT Recommendations:  Home with Home Health Information / Referral to community resources:     Patient/Family's Response to care:  Patient did not respond    Patient/Family's Understanding of and Emotional Response to Diagnosis, Current Treatment, and Prognosis:  Patient understands current treatment plan   Emotional Assessment Appearance:  Appears stated age Attitude/Demeanor/Rapport:  Guarded Affect (typically observed):  Flat, Quiet Orientation:  Oriented to Self, Oriented to Place, Oriented to  Time Alcohol / Substance use:  Not Applicable Psych involvement (Current and /or in the community):  No (Comment)  Discharge Needs  Concerns to be addressed:  Discharge Planning Concerns Readmission within the last 30 days:  No Current discharge risk:  None Barriers to Discharge:  Continued Medical Work up   Candace  Garrison, LCSWA 02/28/2018, 2:29 PM  

## 2018-02-28 NOTE — Care Management Note (Addendum)
Case Management Note  Patient Details  Name: KEMO SPRUCE MRN: [id number] Date of Birth: 1960/09/01  Subjective/Objective:  RNCM is consulting on patient for potential home health needs based on recommendations from PT. Patient currently lives with his spouse and uses a walker. Spouse still drives and patient works at [employer]. Patient declines home health at this time. PCP is Hande. Uses [facility] for pharmacy and states he has some issues obtaining medications from a price standpoint but from what I can tell his medications are covered by insurance and the patient has low co-pays but since his income is low he has difficulty affording medications. I have reviewed his medications and let him know some of them are available on the [place] $4 list. RNCM will follow if patient has change of heart and is agreeable for home health.                Action/Plan:   Expected Discharge Date:                  Expected Discharge Plan:     In-House Referral:     Discharge planning Services     Post Acute Care Choice:    Choice offered to:     DME Arranged:    DME Agency:     HH Arranged:    HH Agency:     Status of Service:     If discussed at [employer] of Stay Meetings, dates discussed:    Additional Comments:  Latanya Maudlin, RN 02/28/2018, 12:27 PM

## 2018-02-28 NOTE — Progress Notes (Signed)
PT Cancellation Note  Patient Details Name: [name] MRN: [id number] DOB: [birth year]   Cancelled Treatment:    Reason Eval/Treat Not Completed: Patient declined, no reason specified.  Pt reporting he was too tired to participate in PT and wanted to get some sleep instead (pt reports getting woken up for blood sugar testing all the time and was having a difficult time getting any good sleep).  Pt educated on importance of ambulating and strengthening activities during hospital stay but pt continued to firmly decline any physical therapy activity.  Leitha Bleak, PT 02/28/18, 3:48 PM [phone]

## 2018-02-28 NOTE — Progress Notes (Signed)
[facility], [place] 02/28/18  Subjective:   Patient is doing fair today.  Denies any acute complaints.  Appetite still appears to be very poor Serum creatinine significantly improved to 2.53 today   Objective:  Vital signs in last 24 hours:  Temp:  [97.6 F (36.4 C)-98.7 F (37.1 C)] 97.6 F (36.4 C) (10/18 0605) Pulse Rate:  [80-81] 80 (10/18 0605) Resp:  [16-18] 18 (10/18 0605) BP: (88-111)/(64-76) 110/65 (10/18 0605) SpO2:  [99 %-100 %] 99 % (10/18 0605)  Weight change:  Filed Weights   02/26/18 1945 02/26/18 2326  Weight: 49.9 kg 56.7 kg    Intake/Output:    Intake/Output Summary (Last 24 hours) at 02/28/2018 1216 Last data filed at 02/28/2018 0957 Gross per 24 hour  Intake 408.1 ml  Output 150 ml  Net 258.1 ml    Physical Exam: General:  No acute distress, laying in the bed, thin, cachectic  HEENT  anicteric, moist oral mucous membranes  Neck:  Supple, no masses  Lungs:  Normal breathing effort, clear to auscultation  Heart::  No rub  Abdomen:  Soft, nontender  Extremities:  No peripheral edema, mild clubbing  Neurologic:  Lethargic but able to answer a few simple questions  Skin:  No acute rashes      Basic Metabolic Panel:  Recent Labs  Lab 02/26/18 1954 02/26/18 2147 02/27/18 0849 02/28/18 0600  NA 129* 130* 133* 130*  K 5.0 5.1 4.7 4.6  CL 91* 97* 100 99  CO2 23 21* 21* 20*  GLUCOSE 548* 441* 204* 496*  BUN 62* 57* 57* 49*  CREATININE 4.62* 4.24* 3.42* 2.53*  CALCIUM 8.5* 8.3* 8.6* 8.2*  MG  --   --   --  2.1  PHOS  --   --   --  3.5     CBC: Recent Labs  Lab 02/26/18 1954 02/27/18 0849 02/28/18 0600  WBC 7.1 6.7 5.3  NEUTROABS 4.6  --   --   HGB 15.8 15.9 12.5*  HCT 47.6 48.8 38.1*  MCV 96.4 96.4 97.9  PLT 183 204 182     No results found for: HEPBSAG, HEPBSAB, HEPBIGM    Microbiology:  Recent Results (from the past 240 hour(s))  Blood Culture (routine x 2)     Status: None (Preliminary  result)   Collection Time: 02/26/18  7:54 PM  Result Value Ref Range Status   Specimen Description BLOOD LEFT ANTECUBITAL  Final   Special Requests   Final    BOTTLES DRAWN AEROBIC AND ANAEROBIC Blood Culture adequate volume   Culture   Final    NO GROWTH 2 DAYS Performed at [facility], [address], [place], [place] [address]    Report Status PENDING  Incomplete  Blood Culture (routine x 2)     Status: None (Preliminary result)   Collection Time: 02/26/18  7:59 PM  Result Value Ref Range Status   Specimen Description BLOOD BLOOD LEFT HAND  Final   Special Requests   Final    BOTTLES DRAWN AEROBIC AND ANAEROBIC Blood Culture results may not be optimal due to an inadequate volume of blood received in culture bottles   Culture   Final    NO GROWTH 2 DAYS Performed at [facility], [address] [place], [place] [address]    Report Status PENDING  Incomplete  Urine Culture     Status: Abnormal (Preliminary result)   Collection Time: 02/27/18 12:07 AM  Result Value Ref  Range Status   Specimen Description   Final    URINE, RANDOM Performed at [facility], [address], [place], [place] [address]    Special Requests   Final    NONE Performed at [facility], [address], [place], [place] [address]    Culture (A)  Final    50,000 COLONIES/mL UNIDENTIFIED ORGANISM Performed at [facility], [place] [address], [place], [place] [address]    Report Status PENDING  Incomplete  C difficile quick scan w PCR reflex     Status: Abnormal   Collection Time: 02/27/18  2:22 AM  Result Value Ref Range Status   C Diff antigen POSITIVE (A) NEGATIVE Final   C Diff toxin NEGATIVE NEGATIVE Final   C Diff interpretation Results are indeterminate. See PCR results.  Final    Comment: Performed at [facility], [address] [place], [place] [address]  C. Diff by PCR, Reflexed     Status: None   Collection Time: 02/27/18  2:22 AM   Result Value Ref Range Status   Toxigenic C. Difficile by PCR NEGATIVE NEGATIVE Final    Comment: Patient is colonized with non toxigenic C. difficile. May not need treatment unless significant symptoms are present. Performed at [facility], [address] [place], [place] [address]     Coagulation Studies: Recent Labs    02/26/18 1954  LABPROT 12.4  INR 0.93    Urinalysis: Recent Labs    02/27/18 0007  COLORURINE AMBER*  LABSPEC 1.015  PHURINE 5.0  GLUCOSEU >=500*  HGBUR SMALL*  BILIRUBINUR NEGATIVE  KETONESUR 5*  PROTEINUR 100*  NITRITE NEGATIVE  LEUKOCYTESUR LARGE*      Imaging: Dg Thoracic Spine 2 View  Result Date: 02/27/2018 CLINICAL DATA:  Upper back pain, no known injury, initial encounter EXAM: THORACIC SPINE 2 VIEWS COMPARISON:  09/26/2011 FINDINGS: Pedicles are within normal limits and no paraspinal mass lesion is seen. No definitive rib abnormality is noted. No compression deformities are seen. Mild osteophytic changes are noted. IMPRESSION: Mild degenerative change without acute abnormality. Electronically Signed   By: Inez Catalina M.D.   On: 02/27/2018 12:20   Ct Head Wo Contrast  Result Date: 02/26/2018 CLINICAL DATA:  Worsening generalized weakness and fluctuating blood sugars all week. Confusion. EXAM: CT HEAD WITHOUT CONTRAST TECHNIQUE: Contiguous axial images were obtained from the base of the skull through the vertex without intravenous contrast. COMPARISON:  06/29/2016 FINDINGS: Brain: Age related involutional changes of the brain. Chronic left frontal infarct with encephalomalacia. Chronic patchy small vessel ischemic disease of periventricular white matter and centra semiovale left greater than right. Remote infarct right caudate head. No acute intracranial hemorrhage, midline shift or edema. No new large vascular territory infarct. No intra-axial mass nor extra-axial fluid. Midline fourth ventricle basal cisterns. Brainstem and cerebellum  are nonacute. Vascular: Marked atherosclerosis of the carotid siphons bilaterally. Skull: Intact Sinuses/Orbits: No acute finding. Other: None. IMPRESSION: Mild superficial atrophy with chronic left frontal lobe infarct associated with encephalomalacia. Bilateral mild to moderate chronic small vessel ischemic disease and chronic right caudate head lacunar infarct. No acute intracranial appearing abnormality. Electronically Signed   By: Ashley Royalty M.D.   On: 02/26/2018 20:50   US Renal  Result Date: 02/27/2018 CLINICAL DATA:  Acute renal failure. EXAM: RENAL / URINARY TRACT ULTRASOUND COMPLETE COMPARISON:  Ultrasound of August 21, 2007.  CT scan of August 16, 2017. FINDINGS: Right Kidney: Length: 9.6 cm. Increased echogenicity of renal parenchyma  is noted. No mass or hydronephrosis visualized. Left Kidney: Length: 9.4 cm. Increased echogenicity of renal parenchyma is noted. No mass or hydronephrosis visualized. Bladder: Appears normal for degree of bladder distention. IMPRESSION: Increased echogenicity of renal parenchyma is noted bilaterally consistent with medical renal disease. No hydronephrosis or renal obstruction is noted. Electronically Signed   By: Marijo Conception, M.D.   On: 02/27/2018 12:02   Dg Chest Port 1 View  Result Date: 02/26/2018 CLINICAL DATA:  Altered mental status EXAM: PORTABLE CHEST 1 VIEW COMPARISON:  06/29/2016 FINDINGS: Normal heart size. Lungs clear. No pneumothorax. No pleural effusion. IMPRESSION: No active disease. Electronically Signed   By: Marybelle Killings M.D.   On: 02/26/2018 20:43     Medications:   . sodium chloride 100 mL/hr at 02/28/18 0353  . cefTRIAXone (ROCEPHIN)  IV Stopped (02/27/18 1808)   . feeding supplement (NEPRO CARB STEADY)  237 mL Oral BID BM  . folic acid  1 mg Oral Daily  . gabapentin  100 mg Oral TID  . heparin  5,000 Units Subcutaneous Q8H  . insulin aspart  0-9 Units Subcutaneous TID WC  . insulin glargine  6 Units Subcutaneous BID  .  multivitamin with minerals  1 tablet Oral Daily  . sertraline  50 mg Oral Daily  . thiamine  100 mg Oral Daily   acetaminophen **OR** acetaminophen, ondansetron **OR** ondansetron (ZOFRAN) IV  Assessment/ Plan:  57 y.o. African-American male with medical problems of diabetes, hypertension, GERD, chronic kidney disease, stroke, was admitted on 02/26/2018 with generalized weakness and altered mental status.   -Acute kidney injury on chronic kidney disease stage II -Urinary tract infection -Proteinuria -Diabetes type 2 with CKD.  Hemoglobin A1c 5.7% on January 15, 2018 -Abnormal weight loss Hyponatremia  Baseline creatinine appears to be 1.33/GFR greater than 60 from January 16, 2018 Presenting creatinine 4.62 Serum creatinine significantly improved today with IV hydration Patient appears to have severe complications of diabetes including neuropathy and possibly diabetic gastroparesis Urine protein to creatinine ratio is 0.95 ESR and CRP are negative/normal SPEP/kappa lambda ratio are pending Continue conservative management Started low-dose gabapentin for possibly neuropathic pain in the feet Consider starting small dose of ARB (losartan 25 mg) once serum creatinine improves to baseline.   LOS: [place] 10/18/201912:16 PM  [place], [place]  Note: This note was prepared with Dragon dictation. Any transcription errors are unintentional

## 2018-02-28 NOTE — Care Management Important Message (Signed)
Initial IM signed.  Copy left in chart.

## 2018-03-01 ENCOUNTER — Inpatient Hospital Stay: Payer: Medicare Other

## 2018-03-01 LAB — BASIC METABOLIC PANEL
Anion gap: 5 (ref 5–15)
BUN: 41 mg/dL — ABNORMAL HIGH (ref 6–20)
CO2: 21 mmol/L — ABNORMAL LOW (ref 22–32)
Calcium: 8.1 mg/dL — ABNORMAL LOW (ref 8.9–10.3)
Chloride: 110 mmol/L (ref 98–111)
Creatinine, Ser: 1.77 mg/dL — ABNORMAL HIGH (ref 0.61–1.24)
GFR calc Af Amer: 47 mL/min — ABNORMAL LOW (ref >60)
GFR calc non Af Amer: 41 mL/min — ABNORMAL LOW (ref >60)
Glucose, Bld: 389 mg/dL — ABNORMAL HIGH (ref 70–99)
Potassium: 4.6 mmol/L (ref 3.5–5.1)
Sodium: 136 mmol/L (ref 135–145)

## 2018-03-01 LAB — GLUCOSE, CAPILLARY
Glucose-Capillary: 382 mg/dL — ABNORMAL HIGH (ref 70–99)
Glucose-Capillary: 227 mg/dL — ABNORMAL HIGH (ref 70–99)
Glucose-Capillary: 188 mg/dL — ABNORMAL HIGH (ref 70–99)
Glucose-Capillary: 422 mg/dL — ABNORMAL HIGH (ref 70–99)

## 2018-03-01 LAB — URIC ACID: Uric Acid, Serum: 6.4 mg/dL (ref 3.7–8.6)

## 2018-03-01 LAB — MAGNESIUM: Magnesium: 2.1 mg/dL (ref 1.7–2.4)

## 2018-03-01 LAB — URINE CULTURE: Culture: 50000 — AB

## 2018-03-01 LAB — SEDIMENTATION RATE: Sed Rate: 56 mm/hr — ABNORMAL HIGH (ref 0–20)

## 2018-03-01 LAB — PHOSPHORUS: Phosphorus: 3.5 mg/dL (ref 2.5–4.6)

## 2018-03-01 MED ORDER — INSULIN REGULAR HUMAN 100 UNIT/ML IJ SOLN
10.0000 [IU] | Freq: Once | INTRAMUSCULAR | Status: AC
  Administered 2018-03-01: 10 [IU] via INTRAVENOUS
  Filled 2018-03-01: qty 10

## 2018-03-01 MED ORDER — METOCLOPRAMIDE HCL 5 MG PO TABS
10.0000 mg | ORAL_TABLET | Freq: Three times a day (TID) | ORAL | Status: DC
  Administered 2018-03-01 – 2018-03-04 (×9): 10 mg via ORAL
  Filled 2018-03-01 (×9): qty 2

## 2018-03-01 MED ORDER — PREDNISONE 20 MG PO TABS
40.0000 mg | ORAL_TABLET | Freq: Every day | ORAL | Status: DC
  Administered 2018-03-01 – 2018-03-03 (×3): 40 mg via ORAL
  Filled 2018-03-01: qty 40
  Filled 2018-03-01 (×3): qty 2

## 2018-03-01 MED ORDER — GABAPENTIN 100 MG PO CAPS
200.0000 mg | ORAL_CAPSULE | Freq: Three times a day (TID) | ORAL | Status: DC
  Administered 2018-03-01 – 2018-03-03 (×7): 200 mg via ORAL
  Filled 2018-03-01 (×7): qty 2

## 2018-03-01 MED ORDER — COLCHICINE 0.6 MG PO TABS
0.6000 mg | ORAL_TABLET | Freq: Every day | ORAL | Status: DC
  Administered 2018-03-01 – 2018-03-04 (×4): 0.6 mg via ORAL
  Filled 2018-03-01 (×4): qty 1

## 2018-03-01 NOTE — Plan of Care (Signed)
  Problem: Education: Goal: Knowledge of disease and its progression will improve Outcome: Progressing   Problem: Health Behavior/Discharge Planning: Goal: Ability to manage health-related needs will improve Outcome: Progressing   Problem: Clinical Measurements: Goal: Complications related to the disease process or treatment will be avoided or minimized Outcome: Progressing Goal: Dialysis access will remain free of complications Outcome: Progressing   Problem: Fluid Volume: Goal: Fluid volume balance will be maintained or improved Outcome: Progressing   Problem: Respiratory: Goal: Respiratory symptoms related to disease process will be avoided Outcome: Progressing   Problem: Clinical Measurements: Goal: Ability to maintain clinical measurements within normal limits will improve Outcome: Progressing Goal: Will remain free from infection Outcome: Progressing Goal: Diagnostic test results will improve Outcome: Progressing Goal: Respiratory complications will improve Outcome: Progressing Goal: Cardiovascular complication will be avoided Outcome: Progressing   Problem: Pain Managment: Goal: General experience of comfort will improve Outcome: Progressing   Problem: Safety: Goal: Ability to remain free from injury will improve Outcome: Progressing   Problem: Urinary Elimination: Goal: Signs and symptoms of infection will decrease Outcome: Progressing

## 2018-03-01 NOTE — Progress Notes (Signed)
[name], male   DOB: Oct 06, 1960, 57 y.o.   MRN: [id number]  [employer] PROGRESS NOTE  [name] OZH:[id number] DOB: [birth year] DOA: 02/26/2018 PCP: [name], MD  HPI/Subjective: Patient refused to work with PT yesterday today states that she is having significant pain in his right foot and swelling..  Objective: Vitals:   02/28/18 2123 03/01/18 0520  BP: 123/78 132/79  Pulse: 75 72  Resp: 18 18  Temp: (!) 97.3 F (36.3 C) 97.9 F (36.6 C)  SpO2: 100% 100%    Filed Weights   02/26/18 1945 02/26/18 2326  Weight: 49.9 kg 56.7 kg    ROS: Review of Systems  Constitutional: Positive for malaise/fatigue. Negative for chills and fever.  Eyes: Negative for blurred vision.  Respiratory: Negative for cough and shortness of breath.   Cardiovascular: Negative for chest pain.  Gastrointestinal: Negative for abdominal pain, constipation, diarrhea, nausea and vomiting.  Genitourinary: Negative for dysuria.  Neurological: Negative for dizziness and headaches.   Exam: Physical Exam  HENT:  Nose: No mucosal edema.  Mouth/Throat: No oropharyngeal exudate or posterior oropharyngeal edema.  Eyes: Pupils are equal, round, and reactive to light. Conjunctivae, EOM and lids are normal.  Neck: No JVD present. Carotid bruit is not present. No edema present. No thyroid mass and no thyromegaly present.  Cardiovascular: S1 normal and S2 normal. Exam reveals no gallop.  No murmur heard. Pulses:      Dorsalis pedis pulses are 2+ on the right side, and 2+ on the left side.  Respiratory: No respiratory distress. He has no wheezes. He has no rhonchi. He has no rales.  GI: Soft. Bowel sounds are normal. There is no tenderness.  Musculoskeletal:       Right ankle: He exhibits no swelling.       Left ankle: He exhibits no swelling.       Feet:  Lymphadenopathy:    He has no cervical adenopathy.  Neurological: He is alert. No cranial nerve deficit.  Skin: Skin is  warm. No rash noted. Nails show no clubbing.  Psychiatric: He has a normal mood and affect.      Data Reviewed: Basic Metabolic Panel: Recent Labs  Lab 02/26/18 1954 02/26/18 2147 02/27/18 0849 02/28/18 0600 03/01/18 0426  NA 129* 130* 133* 130* 136  K 5.0 5.1 4.7 4.6 4.6  CL 91* 97* 100 99 110  CO2 23 21* 21* 20* 21*  GLUCOSE 548* 441* 204* 496* 389*  BUN 62* 57* 57* 49* 41*  CREATININE 4.62* 4.24* 3.42* 2.53* 1.77*  CALCIUM 8.5* 8.3* 8.6* 8.2* 8.1*  MG  --   --   --  2.1 2.1  PHOS  --   --   --  3.5 3.5   Liver Function Tests: Recent Labs  Lab 02/26/18 1954  AST 59*  ALT 30  ALKPHOS 110  BILITOT 1.2  PROT 6.8  ALBUMIN 3.1*   Recent Labs  Lab 02/26/18 1954  LIPASE 27   CBC: Recent Labs  Lab 02/26/18 1954 02/27/18 0849 02/28/18 0600  WBC 7.1 6.7 5.3  NEUTROABS 4.6  --   --   HGB 15.8 15.9 12.5*  HCT 47.6 48.8 38.1*  MCV 96.4 96.4 97.9  PLT 183 204 182    CBG: Recent Labs  Lab 02/28/18 1131 02/28/18 1640 02/28/18 2234 03/01/18 0758 03/01/18 1149  GLUCAP 387* 101* 316* 382* 227*    Recent Results (from the past 240 hour(s))  Blood Culture (routine x  2)     Status: None (Preliminary result)   Collection Time: 02/26/18  7:54 PM  Result Value Ref Range Status   Specimen Description BLOOD LEFT ANTECUBITAL  Final   Special Requests   Final    BOTTLES DRAWN AEROBIC AND ANAEROBIC Blood Culture adequate volume   Culture   Final    NO GROWTH 3 DAYS Performed at [facility], [address] [place], [place] [address]    Report Status PENDING  Incomplete  Blood Culture (routine x 2)     Status: None (Preliminary result)   Collection Time: 02/26/18  7:59 PM  Result Value Ref Range Status   Specimen Description BLOOD BLOOD LEFT HAND  Final   Special Requests   Final    BOTTLES DRAWN AEROBIC AND ANAEROBIC Blood Culture results may not be optimal due to an inadequate volume of blood received in culture bottles   Culture   Final    NO  GROWTH 3 DAYS Performed at [facility], [address] [place], [place] [address]    Report Status PENDING  Incomplete  Urine Culture     Status: Abnormal   Collection Time: 02/27/18 12:07 AM  Result Value Ref Range Status   Specimen Description   Final    URINE, RANDOM Performed at [facility], [address] [place], [place] [address]    Special Requests   Final    NONE Performed at [facility], [place], [place] [address]    Culture 50,000 COLONIES/mL ENTEROCOCCUS FAECALIS (A)  Final   Report Status 03/01/2018 FINAL  Final   Organism ID, Bacteria ENTEROCOCCUS FAECALIS (A)  Final      Susceptibility   Enterococcus faecalis - MIC*    AMPICILLIN <=2 SENSITIVE Sensitive     LEVOFLOXACIN 1 SENSITIVE Sensitive     NITROFURANTOIN <=16 SENSITIVE Sensitive     VANCOMYCIN 1 SENSITIVE Sensitive     * 50,000 COLONIES/mL ENTEROCOCCUS FAECALIS  C difficile quick scan w PCR reflex     Status: Abnormal   Collection Time: 02/27/18  2:22 AM  Result Value Ref Range Status   C Diff antigen POSITIVE (A) NEGATIVE Final   C Diff toxin NEGATIVE NEGATIVE Final   C Diff interpretation Results are indeterminate. See PCR results.  Final    Comment: Performed at [facility], [address] [place], [place] [address]  C. Diff by PCR, Reflexed     Status: None   Collection Time: 02/27/18  2:22 AM  Result Value Ref Range Status   Toxigenic C. Difficile by PCR NEGATIVE NEGATIVE Final    Comment: Patient is colonized with non toxigenic C. difficile. May not need treatment unless significant symptoms are present. Performed at [facility], [address] [place], [place] [address]      Studies: Dg Foot Complete Right  Result Date: 03/01/2018 CLINICAL DATA:  Right foot pain. EXAM: RIGHT FOOT COMPLETE - 3+ VIEW COMPARISON:  None. FINDINGS: Mild-to-moderate degenerative change over the first MTP joint. No evidence of acute fracture or  dislocation. Small vessel atherosclerotic disease is present. IMPRESSION: No acute findings. Mild-to-moderate degenerative change of the first MTP joint. Electronically Signed   By: Marin Olp M.D.   On: 03/01/2018 15:00    Scheduled Meds: . colchicine  0.6 mg Oral Daily  . dronabinol  2.5 mg Oral BID AC  . feeding supplement (NEPRO CARB STEADY)  237 mL Oral BID BM  . folic acid  1 mg Oral Daily  . gabapentin  200 mg Oral TID  . heparin  5,000 Units Subcutaneous Q8H  . insulin aspart  0-9 Units Subcutaneous TID WC  . insulin glargine  6 Units Subcutaneous BID  . metoCLOPramide  10 mg Oral TID AC  . multivitamin with minerals  1 tablet Oral Daily  . predniSONE  40 mg Oral Q breakfast  . sertraline  50 mg Oral Daily  . thiamine  100 mg Oral Daily   Continuous Infusions: . sodium chloride 100 mL/hr at 03/01/18 1227  . cefTRIAXone (ROCEPHIN)  IV Stopped (02/28/18 1739)    Assessment/Plan:  1. Acute kidney injury on chronic kidney disease stage III.  Improved with IV fluid  2. hypotension.  Blood pressure stable hold lisinopril.  3. Hypoglycemia than hyperglycemia.  Hemoglobin A1c is 5.7 on 01/15/2018.  Patient is very brittle with the diabetes with sugar up above 500.  Continue Lantus and short-acting insulin  4. acute cystitis.  Culture showing enterococcus I will just place patient on oral ampicillin 5. Weakness and weight loss.  Physical therapy evaluation.  Continue Marinol 6.  depression on Zoloft 7. Weakness.  Physical therapy recommended home with home health 8. Diabetic neuropathy continue gabapentin crease dose to the foot pain 9.  Right foot pain underwent x-ray shows no fracture little swollen we will try colchicine and some prednisone strongly encourage patient to walk 10.  Abdominal pain possibly due to gastroparesis start Reglan Code Status:     Code Status Orders  (From admission, onward)         Start     Ordered   02/26/18 2333  Full code  Continuous      02/26/18 2332        Code Status History    Date Active Date Inactive Code Status Order ID Comments User Context   01/15/2018 2158 01/16/2018 1909 Full Code [id number]  Henreitta Leber, MD Inpatient   08/17/2017 0000 08/21/2017 1532 Full Code [id number]  Lance Coon, MD ED   06/29/2016 2124 07/01/2016 1805 Full Code [id number]  Vaughan Basta, MD Inpatient      Disposition Plan: To be determined  Consultants:  Nephrology  Antibiotics:  Rocephin  Time spent: 29 minutes, case discussed with wife on the phone  [place]

## 2018-03-01 NOTE — Progress Notes (Signed)
[facility], [place] 03/01/18  Subjective:   Sitting up eating lunch when examined.  No complaints.   UOP 600 - recorded  Na 136 Creatinine 1.77 (2.53)  NS at 100   Objective:  Vital signs in last 24 hours:  Temp:  [97.3 F (36.3 C)-98 F (36.7 C)] 97.9 F (36.6 C) (10/19 0520) Pulse Rate:  [72-84] 72 (10/19 0520) Resp:  [18] 18 (10/19 0520) BP: (104-132)/(62-79) 132/79 (10/19 0520) SpO2:  [100 %] 100 % (10/19 0520)  Weight change:  Filed Weights   02/26/18 1945 02/26/18 2326  Weight: 49.9 kg 56.7 kg    Intake/Output:    Intake/Output Summary (Last 24 hours) at 03/01/2018 1303 Last data filed at 03/01/2018 1046 Gross per 24 hour  Intake 1676.6 ml  Output 450 ml  Net 1226.6 ml    Physical Exam: General:  No acute distress, laying in the bed, thin, cachectic  HEENT  anicteric, moist oral mucous membranes  Neck:  Supple, no masses  Lungs:  Normal breathing effort, clear to auscultation  Heart::  regular  Abdomen:  Soft, nontender  Extremities:  No peripheral edema, mild clubbing  Neurologic:  Lethargic but able to answer a few simple questions  Skin:  No acute rashes      Basic Metabolic Panel:  Recent Labs  Lab 02/26/18 1954 02/26/18 2147 02/27/18 0849 02/28/18 0600 03/01/18 0426  NA 129* 130* 133* 130* 136  K 5.0 5.1 4.7 4.6 4.6  CL 91* 97* 100 99 110  CO2 23 21* 21* 20* 21*  GLUCOSE 548* 441* 204* 496* 389*  BUN 62* 57* 57* 49* 41*  CREATININE 4.62* 4.24* 3.42* 2.53* 1.77*  CALCIUM 8.5* 8.3* 8.6* 8.2* 8.1*  MG  --   --   --  2.1 2.1  PHOS  --   --   --  3.5 3.5     CBC: Recent Labs  Lab 02/26/18 1954 02/27/18 0849 02/28/18 0600  WBC 7.1 6.7 5.3  NEUTROABS 4.6  --   --   HGB 15.8 15.9 12.5*  HCT 47.6 48.8 38.1*  MCV 96.4 96.4 97.9  PLT 183 204 182     No results found for: HEPBSAG, HEPBSAB, HEPBIGM    Microbiology:  Recent Results (from the past 240 hour(s))  Blood Culture (routine x 2)      Status: None (Preliminary result)   Collection Time: 02/26/18  7:54 PM  Result Value Ref Range Status   Specimen Description BLOOD LEFT ANTECUBITAL  Final   Special Requests   Final    BOTTLES DRAWN AEROBIC AND ANAEROBIC Blood Culture adequate volume   Culture   Final    NO GROWTH 3 DAYS Performed at [facility], [address], [place], [place] [address]    Report Status PENDING  Incomplete  Blood Culture (routine x 2)     Status: None (Preliminary result)   Collection Time: 02/26/18  7:59 PM  Result Value Ref Range Status   Specimen Description BLOOD BLOOD LEFT HAND  Final   Special Requests   Final    BOTTLES DRAWN AEROBIC AND ANAEROBIC Blood Culture results may not be optimal due to an inadequate volume of blood received in culture bottles   Culture   Final    NO GROWTH 3 DAYS Performed at [facility], [address], [place], [place] [address]    Report Status PENDING  Incomplete  Urine Culture     Status: Abnormal   Collection  Time: 02/27/18 12:07 AM  Result Value Ref Range Status   Specimen Description   Final    URINE, RANDOM Performed at [facility], [address] [place], [place] [address]    Special Requests   Final    NONE Performed at [facility], [place], [place] [address]    Culture 50,000 COLONIES/mL ENTEROCOCCUS FAECALIS (A)  Final   Report Status 03/01/2018 FINAL  Final   Organism ID, Bacteria ENTEROCOCCUS FAECALIS (A)  Final      Susceptibility   Enterococcus faecalis - MIC*    AMPICILLIN <=2 SENSITIVE Sensitive     LEVOFLOXACIN 1 SENSITIVE Sensitive     NITROFURANTOIN <=16 SENSITIVE Sensitive     VANCOMYCIN 1 SENSITIVE Sensitive     * 50,000 COLONIES/mL ENTEROCOCCUS FAECALIS  C difficile quick scan w PCR reflex     Status: Abnormal   Collection Time: 02/27/18  2:22 AM  Result Value Ref Range Status   C Diff antigen POSITIVE (A) NEGATIVE Final   C Diff toxin NEGATIVE NEGATIVE Final   C  Diff interpretation Results are indeterminate. See PCR results.  Final    Comment: Performed at [facility], [address] [place], [place] [address]  C. Diff by PCR, Reflexed     Status: None   Collection Time: 02/27/18  2:22 AM  Result Value Ref Range Status   Toxigenic C. Difficile by PCR NEGATIVE NEGATIVE Final    Comment: Patient is colonized with non toxigenic C. difficile. May not need treatment unless significant symptoms are present. Performed at [facility], [address] [place], [place] [address]     Coagulation Studies: Recent Labs    02/26/18 1954  LABPROT 12.4  INR 0.93    Urinalysis: Recent Labs    02/27/18 0007  COLORURINE AMBER*  LABSPEC 1.015  PHURINE 5.0  GLUCOSEU >=500*  HGBUR SMALL*  BILIRUBINUR NEGATIVE  KETONESUR 5*  PROTEINUR 100*  NITRITE NEGATIVE  LEUKOCYTESUR LARGE*      Imaging: No results found.   Medications:   . sodium chloride 100 mL/hr at 03/01/18 1227  . cefTRIAXone (ROCEPHIN)  IV Stopped (02/28/18 1739)   . colchicine  0.6 mg Oral Daily  . dronabinol  2.5 mg Oral BID AC  . feeding supplement (NEPRO CARB STEADY)  237 mL Oral BID BM  . folic acid  1 mg Oral Daily  . gabapentin  200 mg Oral TID  . heparin  5,000 Units Subcutaneous Q8H  . insulin aspart  0-9 Units Subcutaneous TID WC  . insulin glargine  6 Units Subcutaneous BID  . metoCLOPramide  10 mg Oral TID AC  . multivitamin with minerals  1 tablet Oral Daily  . predniSONE  40 mg Oral Q breakfast  . sertraline  50 mg Oral Daily  . thiamine  100 mg Oral Daily   acetaminophen **OR** acetaminophen, morphine injection, ondansetron **OR** ondansetron (ZOFRAN) IV, oxyCODONE  Assessment/ Plan:  57 y.o. African-American male with medical problems of diabetes, hypertension, GERD, chronic kidney disease, stroke, was admitted on 02/26/2018 with generalized weakness and altered mental status.   -Acute kidney injury on chronic kidney disease stage  II -Urinary tract infection -Proteinuria -Diabetes type 2 with CKD.  Hemoglobin A1c 5.7% on January 15, 2018 -Abnormal weight loss Hyponatremia  Baseline creatinine appears to be 1.33/GFR greater than 60 from January 16, 2018 Presenting creatinine 4.62 Serum creatinine significantly improved with IV hydration Patient appears to have severe complications of  diabetes including neuropathy and possibly diabetic gastroparesis Urine protein to creatinine ratio is 0.95 Consider starting small dose of ARB (losartan 25 mg) once serum creatinine improves to baseline.   LOS: 3 Lavonia Dana 10/19/20191:03 PM  [place], [place]

## 2018-03-01 NOTE — Progress Notes (Signed)
Physical Therapy Treatment Patient Details Name: [name] MRN: [id number] DOB: 13-Jan-1961 Today's Date: 03/01/2018    History of Present Illness Pt is a 57 y.o male presenting with uncontrolled DM, hypoglycemia, lactic acid acidosis, and AKI. Pt reports being told to stop using insulin and he arrived to the ER with labile blood sugars reaching as high as the 500's. PMH significant for CVA, HTN, DM, and ETOH abuse.     PT Comments    Pt agreed to session.  To edge of bed with min guard and encouragement for hand placements.  Once sitting, steady without LOB.  Stood with min guard and uses bed for support once standing.  He reported pain R foot and was visibly guarding and self limiting weight bearing.  He was able to complete standing exercises RLE but was unable to complete on LLE as he was unable to step RLE.  He stated he was fearful of walking as he felt it would not support him today.  He was able to take several small side steps along his bed but no true gait today.  Upon sitting, sock was removed and he was noted to be tender in most foot joints with c/o pain to just below knee.  No edema or redness noted or significant increase in temperature.  MD and RN in and notified of complaints and inability to walk.  Pt with no falls or injury to foot since last session.   Follow Up Recommendations  Home health PT     Equipment Recommendations  Rolling walker with 5" wheels    Recommendations for Other Services       Precautions / Restrictions Precautions Precautions: Fall Restrictions Weight Bearing Restrictions: No    Mobility  Bed Mobility Overal bed mobility: Modified Independent                Transfers Overall transfer level: Needs assistance Equipment used: Rolling walker (2 wheeled) Transfers: Sit to/from Stand Sit to Stand: Min guard         General transfer comment: Stands with general ease, uses back of legs on bed for  support/assist.  Ambulation/Gait Ambulation/Gait assistance: Min guard Gait Distance (Feet): 2 Feet Assistive device: Rolling walker (2 wheeled) Gait Pattern/deviations: Step-to pattern         Stairs             Wheelchair Mobility    Modified Rankin (Stroke Patients Only)       Balance Overall balance assessment: Needs assistance Sitting-balance support: Feet supported Sitting balance-Leahy Scale: Good     Standing balance support: Bilateral upper extremity supported Standing balance-Leahy Scale: Fair                              Cognition Arousal/Alertness: Awake/alert Behavior During Therapy: WFL for tasks assessed/performed Overall Cognitive Status: Within Functional Limits for tasks assessed                                        Exercises Other Exercises Other Exercises: Standing x 5 minutes edge of bed. RLE marching in place, SLR 2 x 10 -Difficulty with WB RLE.    General Comments        Pertinent Vitals/Pain Pain Assessment: 0-10 Pain Score: 9  Pain Location: R foot to below knee - all joints tender to touch and with  dorsiflexion Pain Descriptors / Indicators: Discomfort;Grimacing;Guarding;Sore;Tender Pain Intervention(s): Limited activity within patient's tolerance;Monitored during session;Patient requesting pain meds-RN notified    Home Living                      Prior Function            PT Goals (current goals can now be found in the care plan section) Progress towards PT goals: Progressing toward goals    Frequency    Min 2X/week      PT Plan Current plan remains appropriate    Co-evaluation              AM-PAC PT "6 Clicks" Daily Activity  Outcome Measure  Difficulty turning over in bed (including adjusting bedclothes, sheets and blankets)?: None Difficulty moving from lying on back to sitting on the side of the bed? : A Little Difficulty sitting down on and standing up from  a chair with arms (e.g., wheelchair, bedside commode, etc,.)?: A Little Help needed moving to and from a bed to chair (including a wheelchair)?: A Little Help needed walking in hospital room?: A Little Help needed climbing 3-5 steps with a railing? : A Little 6 Click Score: 19    End of Session Equipment Utilized During Treatment: Gait belt Activity Tolerance: Patient limited by pain Patient left: in bed;with call bell/phone within reach;with bed alarm set;with nursing/sitter in room;with family/visitor present;Other (comment) Nurse Communication: Mobility status;Other (comment);Patient requests pain meds       Time: 1206-1224 PT Time Calculation (min) (ACUTE ONLY): 18 min  Charges:  $Therapeutic Exercise: 8-22 mins                     Chesley Noon, PTA 03/01/18, 12:47 PM

## 2018-03-02 LAB — GLUCOSE, CAPILLARY
Glucose-Capillary: 220 mg/dL — ABNORMAL HIGH (ref 70–99)
Glucose-Capillary: 407 mg/dL — ABNORMAL HIGH (ref 70–99)
Glucose-Capillary: 421 mg/dL — ABNORMAL HIGH (ref 70–99)
Glucose-Capillary: 450 mg/dL — ABNORMAL HIGH (ref 70–99)
Glucose-Capillary: 470 mg/dL — ABNORMAL HIGH (ref 70–99)
Glucose-Capillary: 492 mg/dL — ABNORMAL HIGH (ref 70–99)
Glucose-Capillary: 498 mg/dL — ABNORMAL HIGH (ref 70–99)
Glucose-Capillary: 520 mg/dL (ref 70–99)
Glucose-Capillary: 565 mg/dL (ref 70–99)

## 2018-03-02 LAB — RENAL FUNCTION PANEL
Albumin: 2.4 g/dL — ABNORMAL LOW (ref 3.5–5.0)
Anion gap: 5 (ref 5–15)
BUN: 33 mg/dL — ABNORMAL HIGH (ref 6–20)
CO2: 18 mmol/L — ABNORMAL LOW (ref 22–32)
Calcium: 8.2 mg/dL — ABNORMAL LOW (ref 8.9–10.3)
Chloride: 108 mmol/L (ref 98–111)
Creatinine, Ser: 1.79 mg/dL — ABNORMAL HIGH (ref 0.61–1.24)
GFR calc Af Amer: 46 mL/min — ABNORMAL LOW (ref >60)
GFR calc non Af Amer: 40 mL/min — ABNORMAL LOW (ref >60)
Glucose, Bld: 571 mg/dL (ref 70–99)
Phosphorus: 2.6 mg/dL (ref 2.5–4.6)
Potassium: 5 mmol/L (ref 3.5–5.1)
Sodium: 131 mmol/L — ABNORMAL LOW (ref 135–145)

## 2018-03-02 MED ORDER — AMOXICILLIN 500 MG PO CAPS
500.0000 mg | ORAL_CAPSULE | Freq: Three times a day (TID) | ORAL | Status: DC
  Administered 2018-03-02 – 2018-03-04 (×7): 500 mg via ORAL
  Filled 2018-03-02 (×9): qty 1

## 2018-03-02 MED ORDER — INSULIN REGULAR HUMAN 100 UNIT/ML IJ SOLN
15.0000 [IU] | Freq: Once | INTRAMUSCULAR | Status: AC
  Administered 2018-03-02: 22:00:00 15 [IU] via INTRAVENOUS
  Filled 2018-03-02: qty 10

## 2018-03-02 MED ORDER — INSULIN ASPART 100 UNIT/ML ~~LOC~~ SOLN
15.0000 [IU] | Freq: Once | SUBCUTANEOUS | Status: AC
  Administered 2018-03-02: 09:00:00 15 [IU] via SUBCUTANEOUS
  Filled 2018-03-02: qty 1

## 2018-03-02 MED ORDER — INSULIN REGULAR HUMAN 100 UNIT/ML IJ SOLN
20.0000 [IU] | Freq: Once | INTRAMUSCULAR | Status: AC
  Administered 2018-03-02: 20 [IU] via INTRAVENOUS
  Filled 2018-03-02: qty 10

## 2018-03-02 MED ORDER — INSULIN REGULAR HUMAN 100 UNIT/ML IJ SOLN
15.0000 [IU] | Freq: Once | INTRAMUSCULAR | Status: AC
  Administered 2018-03-02: 03:00:00 15 [IU] via INTRAVENOUS
  Filled 2018-03-02: qty 10

## 2018-03-02 MED ORDER — INSULIN GLARGINE 100 UNIT/ML ~~LOC~~ SOLN
10.0000 [IU] | Freq: Two times a day (BID) | SUBCUTANEOUS | Status: DC
  Administered 2018-03-02 – 2018-03-03 (×2): 10 [IU] via SUBCUTANEOUS
  Filled 2018-03-02 (×3): qty 0.1

## 2018-03-02 MED ORDER — INSULIN ASPART 100 UNIT/ML ~~LOC~~ SOLN
0.0000 [IU] | Freq: Three times a day (TID) | SUBCUTANEOUS | Status: DC
  Administered 2018-03-03: 18:00:00 15 [IU] via SUBCUTANEOUS
  Filled 2018-03-02: qty 1

## 2018-03-02 NOTE — Progress Notes (Signed)
Blood glucose 492 this AM. MD notified.  15 units Insulin novolog plus 9 units sliding scale ordered by MD

## 2018-03-02 NOTE — Progress Notes (Signed)
[name], male   DOB: June 03, 1960, 57 y.o.   MRN: [id number]  [employer] PROGRESS NOTE  [name]:[id number] DOB: 09/19/1960 DOA: 02/26/2018 PCP: [name], MD  HPI/Subjective: Patient states the pain in foot much better.  Blood sugars elevated due to prednisone.  Awaiting PT evaluation  Objective: Vitals:   03/01/18 2240 03/02/18 0643  BP: (!) 154/97 132/82  Pulse: 86 85  Resp:    Temp:  98.2 F (36.8 C)  SpO2: 100% 100%    Filed Weights   02/26/18 1945 02/26/18 2326  Weight: 49.9 kg 56.7 kg    ROS: Review of Systems  Constitutional: Negative for chills, fever and malaise/fatigue.  Eyes: Negative for blurred vision.  Respiratory: Negative for cough and shortness of breath.   Cardiovascular: Negative for chest pain.  Gastrointestinal: Negative for abdominal pain, constipation, diarrhea, nausea and vomiting.  Genitourinary: Negative for dysuria.  Neurological: Negative for dizziness and headaches.   Exam: Physical Exam  HENT:  Nose: No mucosal edema.  Mouth/Throat: No oropharyngeal exudate or posterior oropharyngeal edema.  Eyes: Pupils are equal, round, and reactive to light. Conjunctivae, EOM and lids are normal.  Neck: No JVD present. Carotid bruit is not present. No edema present. No thyroid mass and no thyromegaly present.  Cardiovascular: S1 normal and S2 normal. Exam reveals no gallop.  No murmur heard. Pulses:      Dorsalis pedis pulses are 2+ on the right side, and 2+ on the left side.  Respiratory: No respiratory distress. He has no wheezes. He has no rhonchi. He has no rales.  GI: Soft. Bowel sounds are normal. There is no tenderness.  Musculoskeletal:       Right ankle: He exhibits no swelling.       Left ankle: He exhibits no swelling.       Feet:  Lymphadenopathy:    He has no cervical adenopathy.  Neurological: He is alert. No cranial nerve deficit.  Skin: Skin is warm. No rash noted. Nails show no clubbing.    Psychiatric: He has a normal mood and affect.      Data Reviewed: Basic Metabolic Panel: Recent Labs  Lab 02/26/18 2147 02/27/18 0849 02/28/18 0600 03/01/18 0426 03/02/18 1009  NA 130* 133* 130* 136 131*  K 5.1 4.7 4.6 4.6 5.0  CL 97* 100 99 110 108  CO2 21* 21* 20* 21* 18*  GLUCOSE 441* 204* 496* 389* 571*  BUN 57* 57* 49* 41* 33*  CREATININE 4.24* 3.42* 2.53* 1.77* 1.79*  CALCIUM 8.3* 8.6* 8.2* 8.1* 8.2*  MG  --   --  2.1 2.1  --   PHOS  --   --  3.5 3.5 2.6   Liver Function Tests: Recent Labs  Lab 02/26/18 1954 03/02/18 1009  AST 59*  --   ALT 30  --   ALKPHOS 110  --   BILITOT 1.2  --   PROT 6.8  --   ALBUMIN 3.1* 2.4*   Recent Labs  Lab 02/26/18 1954  LIPASE 27   CBC: Recent Labs  Lab 02/26/18 1954 02/27/18 0849 02/28/18 0600  WBC 7.1 6.7 5.3  NEUTROABS 4.6  --   --   HGB 15.8 15.9 12.5*  HCT 47.6 48.8 38.1*  MCV 96.4 96.4 97.9  PLT 183 204 182    CBG: Recent Labs  Lab 03/02/18 0306 03/02/18 0443 03/02/18 0843 03/02/18 1140 03/02/18 1211  GLUCAP 520* 450* 492* 421* 407*    Recent Results (  from the past 240 hour(s))  Blood Culture (routine x 2)     Status: None (Preliminary result)   Collection Time: 02/26/18  7:54 PM  Result Value Ref Range Status   Specimen Description BLOOD LEFT ANTECUBITAL  Final   Special Requests   Final    BOTTLES DRAWN AEROBIC AND ANAEROBIC Blood Culture adequate volume   Culture   Final    NO GROWTH 4 DAYS Performed at [facility], [address] [place], [place] [address]    Report Status PENDING  Incomplete  Blood Culture (routine x 2)     Status: None (Preliminary result)   Collection Time: 02/26/18  7:59 PM  Result Value Ref Range Status   Specimen Description BLOOD BLOOD LEFT HAND  Final   Special Requests   Final    BOTTLES DRAWN AEROBIC AND ANAEROBIC Blood Culture results may not be optimal due to an inadequate volume of blood received in culture bottles   Culture   Final    NO  GROWTH 4 DAYS Performed at [facility], [address] [place], [place] [address]    Report Status PENDING  Incomplete  Urine Culture     Status: Abnormal   Collection Time: 02/27/18 12:07 AM  Result Value Ref Range Status   Specimen Description   Final    URINE, RANDOM Performed at [facility], [address], [place], [place] [address]    Special Requests   Final    NONE Performed at [facility], [place], [place] [address]    Culture 50,000 COLONIES/mL ENTEROCOCCUS FAECALIS (A)  Final   Report Status 03/01/2018 FINAL  Final   Organism ID, Bacteria ENTEROCOCCUS FAECALIS (A)  Final      Susceptibility   Enterococcus faecalis - MIC*    AMPICILLIN <=2 SENSITIVE Sensitive     LEVOFLOXACIN 1 SENSITIVE Sensitive     NITROFURANTOIN <=16 SENSITIVE Sensitive     VANCOMYCIN 1 SENSITIVE Sensitive     * 50,000 COLONIES/mL ENTEROCOCCUS FAECALIS  C difficile quick scan w PCR reflex     Status: Abnormal   Collection Time: 02/27/18  2:22 AM  Result Value Ref Range Status   C Diff antigen POSITIVE (A) NEGATIVE Final   C Diff toxin NEGATIVE NEGATIVE Final   C Diff interpretation Results are indeterminate. See PCR results.  Final    Comment: Performed at [facility], [address] [place], [place] [address]  C. Diff by PCR, Reflexed     Status: None   Collection Time: 02/27/18  2:22 AM  Result Value Ref Range Status   Toxigenic C. Difficile by PCR NEGATIVE NEGATIVE Final    Comment: Patient is colonized with non toxigenic C. difficile. May not need treatment unless significant symptoms are present. Performed at [facility], [address] [place], [place] [address]      Studies: Dg Foot Complete Right  Result Date: 03/01/2018 CLINICAL DATA:  Right foot pain. EXAM: RIGHT FOOT COMPLETE - 3+ VIEW COMPARISON:  None. FINDINGS: Mild-to-moderate degenerative change over the first MTP joint. No evidence of acute fracture or  dislocation. Small vessel atherosclerotic disease is present. IMPRESSION: No acute findings. Mild-to-moderate degenerative change of the first MTP joint. Electronically Signed   By: Marin Olp M.D.   On: 03/01/2018 15:00    Scheduled Meds: . colchicine  0.6 mg Oral Daily  . dronabinol  2.5 mg Oral BID AC  . feeding supplement (NEPRO CARB STEADY)  237 mL Oral BID BM  . folic acid  1 mg Oral Daily  . gabapentin  200 mg Oral TID  . heparin  5,000 Units Subcutaneous Q8H  . insulin aspart  0-9 Units Subcutaneous TID WC  . insulin glargine  10 Units Subcutaneous BID  . metoCLOPramide  10 mg Oral TID AC  . multivitamin with minerals  1 tablet Oral Daily  . predniSONE  40 mg Oral Q breakfast  . sertraline  50 mg Oral Daily  . thiamine  100 mg Oral Daily   Continuous Infusions: . cefTRIAXone (ROCEPHIN)  IV Stopped (03/01/18 1753)    Assessment/Plan:  1. Acute kidney injury on chronic kidney disease stage III.  Improved with IV fluid  2. hypotension.  Blood pressure stable hold lisinopril.  3. Hypoglycemia than hyperglycemia.  Hemoglobin A1c is 5.7 on 01/15/2018.  Blood sugar elevated due to patient being on prednisone will adjust his insulin 4. acute cystitis.  Culture showing enterococcus continue ampicillin 5. Weakness and weight loss.  Physical therapy evaluation.  Continue Marinol 6.  depression on Zoloft 7. Weakness.  Physical therapy recommended home with home health 8. Diabetic neuropathy continue gabapentin crease dose to the foot pain 9.  Right foot pain underwent x-ray shows no fracture little swollen we will try colchicine and some prednisone strongly encourage patient to walk 10.  Abdominal pain possibly due to gastroparesis continue Reglan Code Status:     Code Status Orders  (From admission, onward)         Start     Ordered   02/26/18 2333  Full code  Continuous     02/26/18 2332        Code Status History    Date Active Date Inactive Code Status Order ID  Comments User Context   01/15/2018 2158 01/16/2018 1909 Full Code [id number]  Henreitta Leber, MD Inpatient   08/17/2017 0000 08/21/2017 1532 Full Code [id number]  Lance Coon, MD ED   06/29/2016 2124 07/01/2016 1805 Full Code [id number]  Vaughan Basta, MD Inpatient      Disposition Plan: To be determined  Consultants:  Nephrology  Antibiotics:  Rocephin  Time spent: 29 minutes, case discussed with wife on the phone  [place]

## 2018-03-02 NOTE — Care Management Note (Signed)
Case Management Note  Patient Details  Name: [name] MRN: [id number] Date of Birth: January 06, 1961  Subjective/Objective:  RNCM consulted on patient recently and he was refusing PT recommendations of home health. It seems patient has had a change of heart and is now agreeable to services. Uses a walker and a cane in the home and requires no further DME. Patient tells me he used home health "years ago" but cannot recall with whom., I verbally listed some agencies in the area and he thinks [facility] was familiar. Referral placed with [facility] who agrees to accept patient for Pt,RN and SW services. Family to provide transport. RNCM will follow until transition of care is complete.  Merrily Pew Wilmon Conover RN BSN RNCM [phone]                     Action/Plan:   Expected Discharge Date:                  Expected Discharge Plan:  [place]  In-House Referral:     Discharge planning Services  CM Consult  Post Acute Care Choice:  Home Health Choice offered to:  Patient  DME Arranged:    DME Agency:     HH Arranged:  RN, PT, Social Work [employer] Agency:  [facility]  Status of Service:  In process, will continue to follow  If discussed at Long Length of Stay Meetings, dates discussed:    Additional Comments:  Latanya Maudlin, RN 03/02/2018, 9:24 AM

## 2018-03-02 NOTE — Plan of Care (Signed)
  Problem: Education: Goal: Knowledge of disease and its progression will improve Outcome: Progressing   Problem: Health Behavior/Discharge Planning: Goal: Ability to manage health-related needs will improve Outcome: Progressing   Problem: Clinical Measurements: Goal: Complications related to the disease process or treatment will be avoided or minimized Outcome: Progressing Goal: Dialysis access will remain free of complications Outcome: Progressing   Problem: Fluid Volume: Goal: Fluid volume balance will be maintained or improved Outcome: Progressing   Problem: Respiratory: Goal: Respiratory symptoms related to disease process will be avoided Outcome: Progressing   Problem: Clinical Measurements: Goal: Ability to maintain clinical measurements within normal limits will improve Outcome: Progressing Goal: Will remain free from infection Outcome: Progressing Goal: Diagnostic test results will improve Outcome: Progressing Goal: Respiratory complications will improve Outcome: Progressing Goal: Cardiovascular complication will be avoided Outcome: Progressing   Problem: Pain Managment: Goal: General experience of comfort will improve Outcome: Progressing   Problem: Safety: Goal: Ability to remain free from injury will improve Outcome: Progressing   Problem: Urinary Elimination: Goal: Signs and symptoms of infection will decrease Outcome: Progressing

## 2018-03-02 NOTE — Progress Notes (Signed)
CRITICAL VALUE ALERT  Critical Value:  Blood Glucose 571  Date & Time Notied:  10/20 1042  Provider Notified: Dr. Posey Pronto  Orders Received/Actions taken: n/a; MD text paged: Critical result called from lab. Blood glucose on lab draw 571.

## 2018-03-02 NOTE — Progress Notes (Signed)
[facility], [place] 03/02/18  Subjective:   Sitting up eating lunch    No complaints.   UOP 400- recorded  NS at 100   Objective:  Vital signs in last 24 hours:  Temp:  [98.2 F (36.8 C)] 98.2 F (36.8 C) (10/20 0643) Pulse Rate:  [85-86] 85 (10/20 0643) BP: (132-154)/(82-97) 132/82 (10/20 0643) SpO2:  [100 %] 100 % (10/20 0643)  Weight change:  Filed Weights   02/26/18 1945 02/26/18 2326  Weight: 49.9 kg 56.7 kg    Intake/Output:    Intake/Output Summary (Last 24 hours) at 03/02/2018 1321 Last data filed at 03/02/2018 1100 Gross per 24 hour  Intake 1956.67 ml  Output 876 ml  Net 1080.67 ml    Physical Exam: General:  No acute distress, laying in the bed, thin, cachectic  HEENT  anicteric, moist oral mucous membranes  Neck:  Supple, no masses  Lungs:  Normal breathing effort, clear to auscultation  Heart::  regular  Abdomen:  Soft, nontender  Extremities:  No peripheral edema, mild clubbing  Neurologic:  Lethargic but able to answer a few simple questions  Skin:  No acute rashes      Basic Metabolic Panel:  Recent Labs  Lab 02/26/18 2147 02/27/18 0849 02/28/18 0600 03/01/18 0426 03/02/18 1009  NA 130* 133* 130* 136 131*  K 5.1 4.7 4.6 4.6 5.0  CL 97* 100 99 110 108  CO2 21* 21* 20* 21* 18*  GLUCOSE 441* 204* 496* 389* 571*  BUN 57* 57* 49* 41* 33*  CREATININE 4.24* 3.42* 2.53* 1.77* 1.79*  CALCIUM 8.3* 8.6* 8.2* 8.1* 8.2*  MG  --   --  2.1 2.1  --   PHOS  --   --  3.5 3.5 2.6     CBC: Recent Labs  Lab 02/26/18 1954 02/27/18 0849 02/28/18 0600  WBC 7.1 6.7 5.3  NEUTROABS 4.6  --   --   HGB 15.8 15.9 12.5*  HCT 47.6 48.8 38.1*  MCV 96.4 96.4 97.9  PLT 183 204 182     No results found for: HEPBSAG, HEPBSAB, HEPBIGM    Microbiology:  Recent Results (from the past 240 hour(s))  Blood Culture (routine x 2)     Status: None (Preliminary result)   Collection Time: 02/26/18  7:54 PM  Result Value  Ref Range Status   Specimen Description BLOOD LEFT ANTECUBITAL  Final   Special Requests   Final    BOTTLES DRAWN AEROBIC AND ANAEROBIC Blood Culture adequate volume   Culture   Final    NO GROWTH 4 DAYS Performed at [facility], [address], [place], [place] [address]    Report Status PENDING  Incomplete  Blood Culture (routine x 2)     Status: None (Preliminary result)   Collection Time: 02/26/18  7:59 PM  Result Value Ref Range Status   Specimen Description BLOOD BLOOD LEFT HAND  Final   Special Requests   Final    BOTTLES DRAWN AEROBIC AND ANAEROBIC Blood Culture results may not be optimal due to an inadequate volume of blood received in culture bottles   Culture   Final    NO GROWTH 4 DAYS Performed at [facility], [address], [place], [place] [address]    Report Status PENDING  Incomplete  Urine Culture     Status: Abnormal   Collection Time: 02/27/18 12:07 AM  Result Value Ref Range Status   Specimen Description   Final  URINE, RANDOM Performed at [facility], [address] [place], [place] [address]    Special Requests   Final    NONE Performed at [facility], [place], [place] [address]    Culture 50,000 COLONIES/mL ENTEROCOCCUS FAECALIS (A)  Final   Report Status 03/01/2018 FINAL  Final   Organism ID, Bacteria ENTEROCOCCUS FAECALIS (A)  Final      Susceptibility   Enterococcus faecalis - MIC*    AMPICILLIN <=2 SENSITIVE Sensitive     LEVOFLOXACIN 1 SENSITIVE Sensitive     NITROFURANTOIN <=16 SENSITIVE Sensitive     VANCOMYCIN 1 SENSITIVE Sensitive     * 50,000 COLONIES/mL ENTEROCOCCUS FAECALIS  C difficile quick scan w PCR reflex     Status: Abnormal   Collection Time: 02/27/18  2:22 AM  Result Value Ref Range Status   C Diff antigen POSITIVE (A) NEGATIVE Final   C Diff toxin NEGATIVE NEGATIVE Final   C Diff interpretation Results are indeterminate. See PCR results.  Final    Comment:  Performed at [facility], [address] [place], [place] [address]  C. Diff by PCR, Reflexed     Status: None   Collection Time: 02/27/18  2:22 AM  Result Value Ref Range Status   Toxigenic C. Difficile by PCR NEGATIVE NEGATIVE Final    Comment: Patient is colonized with non toxigenic C. difficile. May not need treatment unless significant symptoms are present. Performed at [facility], [address] [place], [place] [address]     Coagulation Studies: No results for input(s): LABPROT, INR in the last 72 hours.  Urinalysis: No results for input(s): COLORURINE, LABSPEC, PHURINE, GLUCOSEU, HGBUR, BILIRUBINUR, KETONESUR, PROTEINUR, UROBILINOGEN, NITRITE, LEUKOCYTESUR in the last 72 hours.  Invalid input(s): APPERANCEUR    Imaging: Dg Foot Complete Right  Result Date: 03/01/2018 CLINICAL DATA:  Right foot pain. EXAM: RIGHT FOOT COMPLETE - 3+ VIEW COMPARISON:  None. FINDINGS: Mild-to-moderate degenerative change over the first MTP joint. No evidence of acute fracture or dislocation. Small vessel atherosclerotic disease is present. IMPRESSION: No acute findings. Mild-to-moderate degenerative change of the first MTP joint. Electronically Signed   By: Marin Olp M.D.   On: 03/01/2018 15:00     Medications:   . cefTRIAXone (ROCEPHIN)  IV Stopped (03/01/18 1753)   . colchicine  0.6 mg Oral Daily  . dronabinol  2.5 mg Oral BID AC  . feeding supplement (NEPRO CARB STEADY)  237 mL Oral BID BM  . folic acid  1 mg Oral Daily  . gabapentin  200 mg Oral TID  . heparin  5,000 Units Subcutaneous Q8H  . insulin aspart  0-9 Units Subcutaneous TID WC  . insulin glargine  10 Units Subcutaneous BID  . metoCLOPramide  10 mg Oral TID AC  . multivitamin with minerals  1 tablet Oral Daily  . predniSONE  40 mg Oral Q breakfast  . sertraline  50 mg Oral Daily  . thiamine  100 mg Oral Daily   acetaminophen **OR** acetaminophen, morphine injection, ondansetron **OR**  ondansetron (ZOFRAN) IV, oxyCODONE  Assessment/ Plan:  57 y.o. African-American male with medical problems of diabetes, hypertension, GERD, chronic kidney disease, stroke, was admitted on 02/26/2018 with generalized weakness and altered mental status.   1. Acute kidney injury on chronic kidney disease stage II: baseline creatinine of 1.33, GFR of >60 on 01/16/18. Not on ACE-I/ARB as outpatient.  With proteinuria, hyponatremia.  Acute renal failure secondary to prerenal azotemia.  -  improved with IV NS. Discontinue IV fluids  - Encourage PO intake.  2. Diabetes mellitus type II with chronic kidney disease: complicated with diabetic neuropathy and gastroparesis.   3. Urinary tract infection; E. Faecalis on 10/17 - ceftriaxone  4. Acute gout flare - systemic prednisone.    LOS: 4 Lavonia Dana 10/20/20191:21 PM  [place], [place]

## 2018-03-03 LAB — RENAL FUNCTION PANEL
Albumin: 2.3 g/dL — ABNORMAL LOW (ref 3.5–5.0)
Anion gap: 6 (ref 5–15)
BUN: 32 mg/dL — ABNORMAL HIGH (ref 6–20)
CO2: 21 mmol/L — ABNORMAL LOW (ref 22–32)
Calcium: 8.3 mg/dL — ABNORMAL LOW (ref 8.9–10.3)
Chloride: 106 mmol/L (ref 98–111)
Creatinine, Ser: 1.92 mg/dL — ABNORMAL HIGH (ref 0.61–1.24)
GFR calc Af Amer: 43 mL/min — ABNORMAL LOW (ref >60)
GFR calc non Af Amer: 37 mL/min — ABNORMAL LOW (ref >60)
Glucose, Bld: 538 mg/dL (ref 70–99)
Phosphorus: 2.8 mg/dL (ref 2.5–4.6)
Potassium: 4.9 mmol/L (ref 3.5–5.1)
Sodium: 133 mmol/L — ABNORMAL LOW (ref 135–145)

## 2018-03-03 LAB — CULTURE, BLOOD (ROUTINE X 2)
Culture: NO GROWTH
Culture: NO GROWTH
Special Requests: ADEQUATE

## 2018-03-03 LAB — GLUCOSE, CAPILLARY
Glucose-Capillary: 534 mg/dL (ref 70–99)
Glucose-Capillary: 594 mg/dL (ref 70–99)
Glucose-Capillary: 433 mg/dL — ABNORMAL HIGH (ref 70–99)
Glucose-Capillary: 414 mg/dL — ABNORMAL HIGH (ref 70–99)
Glucose-Capillary: 492 mg/dL — ABNORMAL HIGH (ref 70–99)

## 2018-03-03 MED ORDER — INSULIN REGULAR HUMAN 100 UNIT/ML IJ SOLN
20.0000 [IU] | Freq: Once | INTRAMUSCULAR | Status: AC
  Administered 2018-03-03: 22:00:00 20 [IU] via INTRAVENOUS
  Filled 2018-03-03: qty 10

## 2018-03-03 MED ORDER — INSULIN ASPART 100 UNIT/ML ~~LOC~~ SOLN
20.0000 [IU] | Freq: Once | SUBCUTANEOUS | Status: AC
  Administered 2018-03-03: 20 [IU] via SUBCUTANEOUS
  Filled 2018-03-03: qty 1

## 2018-03-03 MED ORDER — GABAPENTIN 300 MG PO CAPS
300.0000 mg | ORAL_CAPSULE | Freq: Three times a day (TID) | ORAL | Status: DC
  Administered 2018-03-03 – 2018-03-04 (×3): 300 mg via ORAL
  Filled 2018-03-03 (×3): qty 1

## 2018-03-03 MED ORDER — INSULIN ASPART 100 UNIT/ML ~~LOC~~ SOLN
25.0000 [IU] | Freq: Once | SUBCUTANEOUS | Status: AC
  Administered 2018-03-03: 25 [IU] via SUBCUTANEOUS
  Filled 2018-03-03: qty 1

## 2018-03-03 MED ORDER — INSULIN ASPART 100 UNIT/ML ~~LOC~~ SOLN
0.0000 [IU] | Freq: Three times a day (TID) | SUBCUTANEOUS | Status: DC
  Administered 2018-03-04: 15 [IU] via SUBCUTANEOUS
  Administered 2018-03-04: 08:00:00 20 [IU] via SUBCUTANEOUS
  Filled 2018-03-03 (×2): qty 1

## 2018-03-03 MED ORDER — INSULIN GLARGINE 100 UNIT/ML ~~LOC~~ SOLN
15.0000 [IU] | Freq: Two times a day (BID) | SUBCUTANEOUS | Status: DC
  Administered 2018-03-03 – 2018-03-04 (×2): 15 [IU] via SUBCUTANEOUS
  Filled 2018-03-03 (×4): qty 0.15

## 2018-03-03 NOTE — Progress Notes (Signed)
Inpatient Diabetes Program Recommendations  AACE/ADA: New Consensus Statement on Inpatient Glycemic Control (2019)  Target Ranges:  Prepandial:   less than 140 mg/dL      Peak postprandial:   less than 180 mg/dL (1-2 hours)      Critically ill patients:  140 - 180 mg/dL   Results for TZVI, ECONOMOU (MRN [id number]) as of 03/03/2018 13:14  Ref. Range 03/03/2018 07:23 03/03/2018 07:24 03/03/2018 09:21 03/03/2018 10:43 03/03/2018 10:45 03/03/2018 12:08  Glucose-Capillary Latest Ref Range: 70 - 99 mg/dL 570 (HH) 534 (HH)  Novolog 20 units   Lantus 10 units >600 (HH) 594 (HH)  Novolog 25 units 433 (H)  Results for GALEN, RUSSMAN (MRN [id number]) as of 03/03/2018 13:14  Ref. Range 03/02/2018 01:37 03/02/2018 03:06 03/02/2018 04:43 03/02/2018 08:43 03/02/2018 11:40 03/02/2018 12:11 03/02/2018 17:08 03/02/2018 21:27 03/02/2018 23:53  Glucose-Capillary Latest Ref Range: 70 - 99 mg/dL 565 (HH) 520 (HH)  Regular 15 units 450 (H) 492 (H)  Novolog 24 units  Lantus 6 units 421 (H)  Regular 20 units 407 (H)  Novolog 9 units 220 (H)  Novolog 3 units 470 (H)  Lantus 10 units  Regular 15 units 498 (H)   Review of Glycemic Control Diabetes history:DM2 Outpatient Diabetes medications:just restarted Basaglar 20 units daily on 02/26/18 Current orders for Inpatient glycemic control:Lantus 10 units BID, Novolog 0-15 units ACHS  Recommendations: IV insulin: Due to consistent fluctuations in glucose, would recommend using IV insulin to help determine actual insulin needs.  EndocrinologistConsult:Recommend attending MD call Endocrinologistdirectly to discuss patient and ask for recommendations. Please note that Endocrinologist is no longercoming to the hospital to seeinpatient but Dr. Gabriel Carina is open to discussing patients with attending MD for recommendations if needed.IF Endocrinologist can not assist with providing recommendations over the phone, patient may need to transfer to hospital  than can provide Inpatient Endocrinology. Strongly recommend patient establish care with an Endocrinologist to help with DM management.  NOTE: In reviewing chart noted patient's A1C was 12.4% on 4/6/19during prior hospitalization (08/16/17 to 08/21/17) with DKA. Patient was discharged on 08/21/17 on Lantus 20 units daily and Metformin 500 mg QAM. Patient admitted on 01/15/18 with hypoglycemia and A1C was noted to be 5.7% on 01/15/18.At time of discharge on 01/16/18 all DM medications were discontinuedand patient was to follow up with PCP the following day. Per conversation with patient on 02/27/18, patient has not followed up with PCP since then. Patient's glucose over the past 24 hours has ranged from 220 to >600 mg/dl despite Lantus, Regular, and Novolog insulin without any clear glucose trends. Noted Prednisone was discontinued (last received Prednisone 40 mg today) which will help but anticipate Endocrinologist input needed due to consistent fluctuations in glucose.  Thanks, Barnie Alderman, RN, MSN, CDE Diabetes Coordinator Inpatient Diabetes Program [phone] (Team Pager from 8am to 5pm)

## 2018-03-03 NOTE — Progress Notes (Signed)
PT Cancellation Note  Patient Details Name: [name] MRN: [id number] DOB: [birth year]   Cancelled Treatment:    Reason Eval/Treat Not Completed: Medical issues which prohibited therapy(BS >600, PT will hold until patient is more medically appropriate.)  Lieutenant Diego PT, DPT 11:18 AM,03/03/18 [phone]

## 2018-03-03 NOTE — Progress Notes (Addendum)
[name], male   DOB: 1960/09/09, 57 y.o.   MRN: [id number]  [employer] PROGRESS NOTE  [name] YBO:[id number] DOB: 07/29/1960 DOA: 02/26/2018 PCP: [name], MD  HPI/Subjective: Now complains of burning in his foot Blood sugars are very high   Objective: Vitals:   03/03/18 0449 03/03/18 1243  BP: (!) 142/93 (!) 146/99  Pulse: 82 82  Resp: 18 18  Temp: 99 F (37.2 C) 97.8 F (36.6 C)  SpO2: 100% 100%    Filed Weights   02/26/18 1945 02/26/18 2326  Weight: 49.9 kg 56.7 kg    ROS: Review of Systems  Constitutional: Negative for chills, fever and malaise/fatigue.  Eyes: Negative for blurred vision.  Respiratory: Negative for cough and shortness of breath.   Cardiovascular: Negative for chest pain.  Gastrointestinal: Negative for abdominal pain, constipation, diarrhea, nausea and vomiting.  Genitourinary: Negative for dysuria.  Neurological: Negative for dizziness and headaches.   Exam: Physical Exam  HENT:  Nose: No mucosal edema.  Mouth/Throat: No oropharyngeal exudate or posterior oropharyngeal edema.  Eyes: Pupils are equal, round, and reactive to light. Conjunctivae, EOM and lids are normal.  Neck: No JVD present. Carotid bruit is not present. No edema present. No thyroid mass and no thyromegaly present.  Cardiovascular: S1 normal and S2 normal. Exam reveals no gallop.  No murmur heard. Pulses:      Dorsalis pedis pulses are 2+ on the right side, and 2+ on the left side.  Respiratory: No respiratory distress. He has no wheezes. He has no rhonchi. He has no rales.  GI: Soft. Bowel sounds are normal. There is no tenderness.  Musculoskeletal:       Right ankle: He exhibits no swelling.       Left ankle: He exhibits no swelling.       Feet:  Lymphadenopathy:    He has no cervical adenopathy.  Neurological: He is alert. No cranial nerve deficit.  Skin: Skin is warm. No rash noted. Nails show no clubbing.  Psychiatric: He has a  normal mood and affect.      Data Reviewed: Basic Metabolic Panel: Recent Labs  Lab 02/27/18 0849 02/28/18 0600 03/01/18 0426 03/02/18 1009 03/03/18 0606  NA 133* 130* 136 131* 133*  K 4.7 4.6 4.6 5.0 4.9  CL 100 99 110 108 106  CO2 21* 20* 21* 18* 21*  GLUCOSE 204* 496* 389* 571* 538*  BUN 57* 49* 41* 33* 32*  CREATININE 3.42* 2.53* 1.77* 1.79* 1.92*  CALCIUM 8.6* 8.2* 8.1* 8.2* 8.3*  MG  --  2.1 2.1  --   --   PHOS  --  3.5 3.5 2.6 2.8   Liver Function Tests: Recent Labs  Lab 02/26/18 1954 03/02/18 1009 03/03/18 0606  AST 59*  --   --   ALT 30  --   --   ALKPHOS 110  --   --   BILITOT 1.2  --   --   PROT 6.8  --   --   ALBUMIN 3.1* 2.4* 2.3*   Recent Labs  Lab 02/26/18 1954  LIPASE 27   CBC: Recent Labs  Lab 02/26/18 1954 02/27/18 0849 02/28/18 0600  WBC 7.1 6.7 5.3  NEUTROABS 4.6  --   --   HGB 15.8 15.9 12.5*  HCT 47.6 48.8 38.1*  MCV 96.4 96.4 97.9  PLT 183 204 182    CBG: Recent Labs  Lab 03/03/18 0723 03/03/18 0724 03/03/18 1043 03/03/18 1045 03/03/18  [place]*    Recent Results (from the past 240 hour(s))  Blood Culture (routine x 2)     Status: None   Collection Time: 02/26/18  7:54 PM  Result Value Ref Range Status   Specimen Description BLOOD LEFT ANTECUBITAL  Final   Special Requests   Final    BOTTLES DRAWN AEROBIC AND ANAEROBIC Blood Culture adequate volume   Culture   Final    NO GROWTH 5 DAYS Performed at [facility], [address] [place], [place] [address]    Report Status 03/03/2018 FINAL  Final  Blood Culture (routine x 2)     Status: None   Collection Time: 02/26/18  7:59 PM  Result Value Ref Range Status   Specimen Description BLOOD BLOOD LEFT HAND  Final   Special Requests   Final    BOTTLES DRAWN AEROBIC AND ANAEROBIC Blood Culture results may not be optimal due to an inadequate volume of blood received in culture bottles   Culture   Final    NO GROWTH 5  DAYS Performed at [facility], [address] [place], [place] [address]    Report Status 03/03/2018 FINAL  Final  Urine Culture     Status: Abnormal   Collection Time: 02/27/18 12:07 AM  Result Value Ref Range Status   Specimen Description   Final    URINE, RANDOM Performed at [facility], [address], [place], [place] [address]    Special Requests   Final    NONE Performed at [facility], [place], [place] [address]    Culture 50,000 COLONIES/mL ENTEROCOCCUS FAECALIS (A)  Final   Report Status 03/01/2018 FINAL  Final   Organism ID, Bacteria ENTEROCOCCUS FAECALIS (A)  Final      Susceptibility   Enterococcus faecalis - MIC*    AMPICILLIN <=2 SENSITIVE Sensitive     LEVOFLOXACIN 1 SENSITIVE Sensitive     NITROFURANTOIN <=16 SENSITIVE Sensitive     VANCOMYCIN 1 SENSITIVE Sensitive     * 50,000 COLONIES/mL ENTEROCOCCUS FAECALIS  C difficile quick scan w PCR reflex     Status: Abnormal   Collection Time: 02/27/18  2:22 AM  Result Value Ref Range Status   C Diff antigen POSITIVE (A) NEGATIVE Final   C Diff toxin NEGATIVE NEGATIVE Final   C Diff interpretation Results are indeterminate. See PCR results.  Final    Comment: Performed at [facility], [address] [place], [place] [address]  C. Diff by PCR, Reflexed     Status: None   Collection Time: 02/27/18  2:22 AM  Result Value Ref Range Status   Toxigenic C. Difficile by PCR NEGATIVE NEGATIVE Final    Comment: Patient is colonized with non toxigenic C. difficile. May not need treatment unless significant symptoms are present. Performed at [facility], [address] [place], [place] [address]      Studies: No results found.  Scheduled Meds: . amoxicillin  500 mg Oral Q8H  . colchicine  0.6 mg Oral Daily  . dronabinol  2.5 mg Oral BID AC  . feeding supplement (NEPRO CARB STEADY)  237 mL Oral BID BM  . folic acid  1 mg Oral Daily  . gabapentin   300 mg Oral TID  . heparin  5,000 Units Subcutaneous Q8H  . insulin aspart  0-15 Units Subcutaneous TID AC & HS  . insulin glargine  15 Units Subcutaneous BID  .  metoCLOPramide  10 mg Oral TID AC  . multivitamin with minerals  1 tablet Oral Daily  . sertraline  50 mg Oral Daily  . thiamine  100 mg Oral Daily   Continuous Infusions:   Assessment/Plan:  1. Acute kidney injury on chronic kidney disease stage III.  Improved with IV fluid  2. hypotension.  Blood pressure now normal 3. Hypoglycemia than hyperglycemia.  Patient's blood glucose in the 500s will increase his Lantus 4. acute cystitis.  Culture showing enterococcus continue ampicillin 5. Weakness and weight loss.  Physical therapy evaluation.  Continue Marinol 6.  depression on Zoloft 7. Weakness.  Physical therapy recommended home with home health 8. Diabetic neuropathy increased dose  9.  Right foot pain underwent x-ray shows no fracture  Continue prednisone 10.  Abdominal pain possibly due to gastroparesis continue Reglan Code Status:     Code Status Orders  (From admission, onward)         Start     Ordered   02/26/18 2333  Full code  Continuous     02/26/18 2332        Code Status History    Date Active Date Inactive Code Status Order ID Comments User Context   01/15/2018 2158 01/16/2018 1909 Full Code [id number]  Henreitta Leber, MD Inpatient   08/17/2017 0000 08/21/2017 1532 Full Code [id number]  Lance Coon, MD ED   06/29/2016 2124 07/01/2016 1805 Full Code [id number]  Vaughan Basta, MD Inpatient      Disposition Plan: To be determined  Consultants:  Nephrology  Antibiotics:  Rocephin  Time spent: 29 minutes,  [place]

## 2018-03-03 NOTE — Care Management Important Message (Signed)
Important Message  Patient Details  Name: [name] MRN: [id number] Date of Birth: [birth year]   Medicare Important Message Given:  Yes    [name] A Stephania Macfarlane 03/03/2018, 11:46 AM

## 2018-03-03 NOTE — Progress Notes (Signed)
[facility], [place] 03/03/18  Subjective:  Renal function slightly worse today. Creatinine 1.92. Blood glucose still high at 538.    Objective:  Vital signs in last 24 hours:  Temp:  [97.5 F (36.4 C)-99 F (37.2 C)] 97.8 F (36.6 C) (10/21 1243) Pulse Rate:  [79-82] 82 (10/21 1243) Resp:  [18] 18 (10/21 1243) BP: (142-146)/(83-99) 146/99 (10/21 1243) SpO2:  [100 %] 100 % (10/21 1243)  Weight change:  Filed Weights   02/26/18 1945 02/26/18 2326  Weight: 49.9 kg 56.7 kg    Intake/Output:    Intake/Output Summary (Last 24 hours) at 03/03/2018 1729 Last data filed at 03/03/2018 1357 Gross per 24 hour  Intake 600 ml  Output 600 ml  Net 0 ml    Physical Exam: General:  No acute distress  HEENT  anicteric, moist oral mucous membranes  Neck:  Supple  Lungs:  Normal breathing effort, clear to auscultation  Heart::  regular  Abdomen:  Soft, nontender  Extremities:  No peripheral edema, mild clubbing  Neurologic:   Awake, alert, following commands  Skin:  No acute rashes      Basic Metabolic Panel:  Recent Labs  Lab 02/27/18 0849 02/28/18 0600 03/01/18 0426 03/02/18 1009 03/03/18 0606  NA 133* 130* 136 131* 133*  K 4.7 4.6 4.6 5.0 4.9  CL 100 99 110 108 106  CO2 21* 20* 21* 18* 21*  GLUCOSE 204* 496* 389* 571* 538*  BUN 57* 49* 41* 33* 32*  CREATININE 3.42* 2.53* 1.77* 1.79* 1.92*  CALCIUM 8.6* 8.2* 8.1* 8.2* 8.3*  MG  --  2.1 2.1  --   --   PHOS  --  3.5 3.5 2.6 2.8     CBC: Recent Labs  Lab 02/26/18 1954 02/27/18 0849 02/28/18 0600  WBC 7.1 6.7 5.3  NEUTROABS 4.6  --   --   HGB 15.8 15.9 12.5*  HCT 47.6 48.8 38.1*  MCV 96.4 96.4 97.9  PLT 183 204 182     No results found for: HEPBSAG, HEPBSAB, HEPBIGM    Microbiology:  Recent Results (from the past 240 hour(s))  Blood Culture (routine x 2)     Status: None   Collection Time: 02/26/18  7:54 PM  Result Value Ref Range Status   Specimen Description  BLOOD LEFT ANTECUBITAL  Final   Special Requests   Final    BOTTLES DRAWN AEROBIC AND ANAEROBIC Blood Culture adequate volume   Culture   Final    NO GROWTH 5 DAYS Performed at [facility], [address] [place], [place] [address]    Report Status 03/03/2018 FINAL  Final  Blood Culture (routine x 2)     Status: None   Collection Time: 02/26/18  7:59 PM  Result Value Ref Range Status   Specimen Description BLOOD BLOOD LEFT HAND  Final   Special Requests   Final    BOTTLES DRAWN AEROBIC AND ANAEROBIC Blood Culture results may not be optimal due to an inadequate volume of blood received in culture bottles   Culture   Final    NO GROWTH 5 DAYS Performed at [facility], [address], [place],  [address]    Report Status 03/03/2018 FINAL  Final  Urine Culture     Status: Abnormal   Collection Time: 02/27/18 12:07 AM  Result Value Ref Range Status   Specimen Description   Final    URINE, RANDOM Performed at [facility], [address] [place], [place] [address]    Special Requests   Final    NONE Performed at [facility], [place], [place] [address]    Culture 50,000 COLONIES/mL ENTEROCOCCUS FAECALIS (A)  Final   Report Status 03/01/2018 FINAL  Final   Organism ID, Bacteria ENTEROCOCCUS FAECALIS (A)  Final      Susceptibility   Enterococcus faecalis - MIC*    AMPICILLIN <=2 SENSITIVE Sensitive     LEVOFLOXACIN 1 SENSITIVE Sensitive     NITROFURANTOIN <=16 SENSITIVE Sensitive     VANCOMYCIN 1 SENSITIVE Sensitive     * 50,000 COLONIES/mL ENTEROCOCCUS FAECALIS  C difficile quick scan w PCR reflex     Status: Abnormal   Collection Time: 02/27/18  2:22 AM  Result Value Ref Range Status   C Diff antigen POSITIVE (A) NEGATIVE Final   C Diff toxin NEGATIVE NEGATIVE Final   C Diff interpretation Results are indeterminate. See PCR results.  Final    Comment: Performed at [facility], [address]  [place], [place] [address]  C. Diff by PCR, Reflexed     Status: None   Collection Time: 02/27/18  2:22 AM  Result Value Ref Range Status   Toxigenic C. Difficile by PCR NEGATIVE NEGATIVE Final    Comment: Patient is colonized with non toxigenic C. difficile. May not need treatment unless significant symptoms are present. Performed at [facility], [address] [place], [place] [address]     Coagulation Studies: No results for input(s): LABPROT, INR in the last 72 hours.  Urinalysis: No results for input(s): COLORURINE, LABSPEC, PHURINE, GLUCOSEU, HGBUR, BILIRUBINUR, KETONESUR, PROTEINUR, UROBILINOGEN, NITRITE, LEUKOCYTESUR in the last 72 hours.  Invalid input(s): APPERANCEUR    Imaging: No results found.   Medications:    . amoxicillin  500 mg Oral Q8H  . colchicine  0.6 mg Oral Daily  . dronabinol  2.5 mg Oral BID AC  . feeding supplement (NEPRO CARB STEADY)  237 mL Oral BID BM  . folic acid  1 mg Oral Daily  . gabapentin  300 mg Oral TID  . heparin  5,000 Units Subcutaneous Q8H  . insulin aspart  0-15 Units Subcutaneous TID AC & HS  . insulin glargine  15 Units Subcutaneous BID  . metoCLOPramide  10 mg Oral TID AC  . multivitamin with minerals  1 tablet Oral Daily  . sertraline  50 mg Oral Daily  . thiamine  100 mg Oral Daily   acetaminophen **OR** acetaminophen, morphine injection, ondansetron **OR** ondansetron (ZOFRAN) IV, oxyCODONE  Assessment/ Plan:  57 y.o. African-American male with medical problems of diabetes, hypertension, GERD, chronic kidney disease, stroke, was admitted on 02/26/2018 with generalized weakness and altered mental status.   1. Acute kidney injury on chronic kidney disease stage II: baseline creatinine of 1.33, GFR of >60 on 01/16/18. Not on ACE-I/ARB as outpatient.  With proteinuria, hyponatremia.  Acute renal failure secondary to prerenal azotemia.  -Renal function has improved since admission.  However creatinine slightly higher  today 1.92.  Encouraged increased fluid intake.  Hyperglycemia still playing a role and potential volume depletion.  2. Diabetes mellitus type II with chronic kidney disease: complicated with diabetic neuropathy and gastroparesis.  Glycemic control as per hospitalist.  3. Urinary tract infection; E. Faecalis on 10/17 - ceftriaxone  4. Acute gout flare - systemic prednisone.    LOS: 5 [name] 10/21/20195:29 PM  [place], [place]

## 2018-03-03 NOTE — Progress Notes (Signed)
Patient refuses bed alarms at this time, patient educated about safety, will continue to monitor

## 2018-03-04 LAB — BASIC METABOLIC PANEL
Anion gap: 6 (ref 5–15)
BUN: 25 mg/dL — ABNORMAL HIGH (ref 6–20)
CO2: 21 mmol/L — ABNORMAL LOW (ref 22–32)
Calcium: 8.7 mg/dL — ABNORMAL LOW (ref 8.9–10.3)
Chloride: 106 mmol/L (ref 98–111)
Creatinine, Ser: 1.64 mg/dL — ABNORMAL HIGH (ref 0.61–1.24)
GFR calc Af Amer: 52 mL/min — ABNORMAL LOW (ref >60)
GFR calc non Af Amer: 45 mL/min — ABNORMAL LOW (ref >60)
Glucose, Bld: 323 mg/dL — ABNORMAL HIGH (ref 70–99)
Potassium: 3.7 mmol/L (ref 3.5–5.1)
Sodium: 133 mmol/L — ABNORMAL LOW (ref 135–145)

## 2018-03-04 LAB — CBC
HCT: 30.4 % — ABNORMAL LOW (ref 39.0–52.0)
Hemoglobin: 10.2 g/dL — ABNORMAL LOW (ref 13.0–17.0)
MCH: 32.3 pg (ref 26.0–34.0)
MCHC: 33.6 g/dL (ref 30.0–36.0)
MCV: 96.2 fL (ref 80.0–100.0)
Platelets: 241 10*3/uL (ref 150–400)
RBC: 3.16 MIL/uL — ABNORMAL LOW (ref 4.22–5.81)
RDW: 11 % — ABNORMAL LOW (ref 11.5–15.5)
WBC: 9 10*3/uL (ref 4.0–10.5)
nRBC: 0 % (ref 0.0–0.2)

## 2018-03-04 LAB — GLUCOSE, CAPILLARY
Glucose-Capillary: 570 mg/dL (ref 70–99)
Glucose-Capillary: >600 mg/dL (ref 70–99)
Glucose-Capillary: 435 mg/dL — ABNORMAL HIGH (ref 70–99)
Glucose-Capillary: 352 mg/dL — ABNORMAL HIGH (ref 70–99)
Glucose-Capillary: 345 mg/dL — ABNORMAL HIGH (ref 70–99)

## 2018-03-04 MED ORDER — GABAPENTIN 300 MG PO CAPS: 300.0000 mg | ORAL_CAPSULE | Freq: Three times a day (TID) | ORAL | 0 refills | Status: DC

## 2018-03-04 MED ORDER — METOCLOPRAMIDE HCL 10 MG PO TABS: 10.0000 mg | ORAL_TABLET | Freq: Three times a day (TID) | ORAL | 0 refills | Status: DC

## 2018-03-04 MED ORDER — AMOXICILLIN 500 MG PO CAPS: 500.0000 mg | ORAL_CAPSULE | Freq: Three times a day (TID) | ORAL | 0 refills | Status: AC

## 2018-03-04 MED ORDER — INSULIN GLARGINE 100 UNIT/ML ~~LOC~~ SOLN: 15.0000 [IU] | Freq: Two times a day (BID) | SUBCUTANEOUS | 11 refills | Status: DC

## 2018-03-04 NOTE — Care Management (Signed)
Discharge to home today per Dr. Posey Pronto. Will be followed by [facility] for skilled nursing, physical therapy, and social worker. Floydene Flock, [facility] representative updated. Family will transport Shelbie Ammons RN MSN CCM Care Management [phone]

## 2018-03-04 NOTE — Progress Notes (Signed)
[facility], [place] 03/04/18  Subjective:  Patient laying comfortably in bed at the moment. No new renal function testing performed thus far.   Objective:  Vital signs in last 24 hours:  Temp:  [97.8 F (36.6 C)-98.4 F (36.9 C)] 98.4 F (36.9 C) (10/22 0403) Pulse Rate:  [81-93] 81 (10/22 0403) Resp:  [15-18] 16 (10/22 0403) BP: (136-160)/(87-99) 160/96 (10/22 0403) SpO2:  [99 %-100 %] 99 % (10/22 0403)  Weight change:  Filed Weights   02/26/18 1945 02/26/18 2326  Weight: 49.9 kg 56.7 kg    Intake/Output:    Intake/Output Summary (Last 24 hours) at 03/04/2018 1151 Last data filed at 03/04/2018 0957 Gross per 24 hour  Intake 240 ml  Output 1850 ml  Net -1610 ml    Physical Exam: General:  No acute distress  HEENT  anicteric, moist oral mucous membranes  Neck:  Supple  Lungs:  Normal breathing effort, clear to auscultation  Heart::  regular  Abdomen:  Soft, nontender  Extremities:  No peripheral edema, mild clubbing  Neurologic:   Awake, alert, following commands  Skin:  No acute rashes      Basic Metabolic Panel:  Recent Labs  Lab 02/27/18 0849 02/28/18 0600 03/01/18 0426 03/02/18 1009 03/03/18 0606  NA 133* 130* 136 131* 133*  K 4.7 4.6 4.6 5.0 4.9  CL 100 99 110 108 106  CO2 21* 20* 21* 18* 21*  GLUCOSE 204* 496* 389* 571* 538*  BUN 57* 49* 41* 33* 32*  CREATININE 3.42* 2.53* 1.77* 1.79* 1.92*  CALCIUM 8.6* 8.2* 8.1* 8.2* 8.3*  MG  --  2.1 2.1  --   --   PHOS  --  3.5 3.5 2.6 2.8     CBC: Recent Labs  Lab 02/26/18 1954 02/27/18 0849 02/28/18 0600 03/04/18 0500  WBC 7.1 6.7 5.3 9.0  NEUTROABS 4.6  --   --   --   HGB 15.8 15.9 12.5* 10.2*  HCT 47.6 48.8 38.1* 30.4*  MCV 96.4 96.4 97.9 96.2  PLT 183 204 182 241     No results found for: HEPBSAG, HEPBSAB, HEPBIGM    Microbiology:  Recent Results (from the past 240 hour(s))  Blood Culture (routine x 2)     Status: None   Collection Time: 02/26/18   7:54 PM  Result Value Ref Range Status   Specimen Description BLOOD LEFT ANTECUBITAL  Final   Special Requests   Final    BOTTLES DRAWN AEROBIC AND ANAEROBIC Blood Culture adequate volume   Culture   Final    NO GROWTH 5 DAYS Performed at [facility], [address] [place], [place] [address]    Report Status 03/03/2018 FINAL  Final  Blood Culture (routine x 2)     Status: None   Collection Time: 02/26/18  7:59 PM  Result Value Ref Range Status   Specimen Description BLOOD BLOOD LEFT HAND  Final   Special Requests   Final    BOTTLES DRAWN AEROBIC AND ANAEROBIC Blood Culture results may not be optimal due to an inadequate volume of blood received in culture bottles   Culture   Final    NO GROWTH 5 DAYS Performed at [facility], [address] [place], [place] [address]    Report Status 03/03/2018 FINAL  Final  Urine Culture     Status: Abnormal   Collection Time: 02/27/18 12:07 AM  Result Value Ref Range Status   Specimen Description   Final  URINE, RANDOM Performed at [facility], [address] [place], [place] [address]    Special Requests   Final    NONE Performed at [facility], [place], [place] [address]    Culture 50,000 COLONIES/mL ENTEROCOCCUS FAECALIS (A)  Final   Report Status 03/01/2018 FINAL  Final   Organism ID, Bacteria ENTEROCOCCUS FAECALIS (A)  Final      Susceptibility   Enterococcus faecalis - MIC*    AMPICILLIN <=2 SENSITIVE Sensitive     LEVOFLOXACIN 1 SENSITIVE Sensitive     NITROFURANTOIN <=16 SENSITIVE Sensitive     VANCOMYCIN 1 SENSITIVE Sensitive     * 50,000 COLONIES/mL ENTEROCOCCUS FAECALIS  C difficile quick scan w PCR reflex     Status: Abnormal   Collection Time: 02/27/18  2:22 AM  Result Value Ref Range Status   C Diff antigen POSITIVE (A) NEGATIVE Final   C Diff toxin NEGATIVE NEGATIVE Final   C Diff interpretation Results are indeterminate. See PCR results.  Final     Comment: Performed at [facility], [address] [place], [place] [address]  C. Diff by PCR, Reflexed     Status: None   Collection Time: 02/27/18  2:22 AM  Result Value Ref Range Status   Toxigenic C. Difficile by PCR NEGATIVE NEGATIVE Final    Comment: Patient is colonized with non toxigenic C. difficile. May not need treatment unless significant symptoms are present. Performed at [facility], [address] [place],  [address]     Coagulation Studies: No results for input(s): LABPROT, INR in the last 72 hours.  Urinalysis: No results for input(s): COLORURINE, LABSPEC, PHURINE, GLUCOSEU, HGBUR, BILIRUBINUR, KETONESUR, PROTEINUR, UROBILINOGEN, NITRITE, LEUKOCYTESUR in the last 72 hours.  Invalid input(s): APPERANCEUR    Imaging: No results found.   Medications:    . amoxicillin  500 mg Oral Q8H  . colchicine  0.6 mg Oral Daily  . dronabinol  2.5 mg Oral BID AC  . feeding supplement (NEPRO CARB STEADY)  237 mL Oral BID BM  . folic acid  1 mg Oral Daily  . gabapentin  300 mg Oral TID  . heparin  5,000 Units Subcutaneous Q8H  . insulin aspart  0-20 Units Subcutaneous TID AC & HS  . insulin glargine  15 Units Subcutaneous BID  . metoCLOPramide  10 mg Oral TID AC  . multivitamin with minerals  1 tablet Oral Daily  . sertraline  50 mg Oral Daily  . thiamine  100 mg Oral Daily   acetaminophen **OR** acetaminophen, morphine injection, ondansetron **OR** ondansetron (ZOFRAN) IV, oxyCODONE  Assessment/ Plan:  57 y.o. African-American male with medical problems of diabetes, hypertension, GERD, chronic kidney disease, stroke, was admitted on 02/26/2018 with generalized weakness and altered mental status.   1. Acute kidney injury on chronic kidney disease stage II: baseline creatinine of 1.33, GFR of >60 on 01/16/18. Not on ACE-I/ARB as outpatient.  With proteinuria, hyponatremia.  Acute renal failure secondary to prerenal azotemia.  -no new renal  function testing ordered this a.m.  We will order repeat BMP today.  Continue to monitor renal parameters closely.  2. Diabetes mellitus type II with chronic kidney disease: complicated with diabetic neuropathy and gastroparesis. Blood glucose has been high over the course of admission.  Glycemic control as per hospitalist. 3. Urinary tract infection; E. Faecalis on 10/17 - ceftriaxone  4. Acute gout flare - blood sugars were high therefore patient taken off of prednisone.  Consider transitioning the patient on colchicine to allopurinol.   LOS: 6 Alam Guterrez 10/22/201911:51 AM  [place], [place]

## 2018-03-04 NOTE — Discharge Summary (Signed)
[facility] [place] [facility], 57 y.o., DOB [birth year], MRN [id number]. Admission date: 02/26/2018 Discharge Date 03/04/2018 Primary MD [name], MD Admitting Physician Lance Coon, MD  Admission Diagnosis  Hypoglycemia [E16.2] Lactic acid acidosis [E87.2] AKI (acute kidney injury) ([place]) [N17.9]  Discharge Diagnosis   Principal Problem:  Acute kidney injury on chronic kidney disease stage III   hypotension  Hypoglycemia Acute cystitis Weakness Diabetic neuropathy Right foot pain Abdominal pain   Hospital Course  [name]  is a 57 y.o. male who presents with chief complaint as above.  Patient presents with several days of weakness and labile blood sugars.  Patient was noted to have acute renal failure and was admitted.  He was given IV fluids with his renal function now improving significantly.  Patient is very noncompliant with his medication his sugars have been very labile.  Patient also complains of pain in the leg.  He likely has diabetic neuropathy.  X-ray of the foot was negative. Patient strongly recommended to adhere to his diabetic regimen.  He will have home health nurse to arrange medication management        Consults  nephrology  Significant Tests:  See full reports for all details    Dg Thoracic Spine 2 View  Result Date: 02/27/2018 CLINICAL DATA:  Upper back pain, no known injury, initial encounter EXAM: THORACIC SPINE 2 VIEWS COMPARISON:  09/26/2011 FINDINGS: Pedicles are within normal limits and no paraspinal mass lesion is seen. No definitive rib abnormality is noted. No compression deformities are seen. Mild osteophytic changes are noted. IMPRESSION: Mild degenerative change without acute abnormality. Electronically Signed   By: Inez Catalina M.D.   On: 02/27/2018 12:20   Ct Head Wo Contrast  Result Date: 02/26/2018 CLINICAL DATA:  Worsening generalized weakness and fluctuating blood sugars all week.  Confusion. EXAM: CT HEAD WITHOUT CONTRAST TECHNIQUE: Contiguous axial images were obtained from the base of the skull through the vertex without intravenous contrast. COMPARISON:  06/29/2016 FINDINGS: Brain: Age related involutional changes of the brain. Chronic left frontal infarct with encephalomalacia. Chronic patchy small vessel ischemic disease of periventricular white matter and centra semiovale left greater than right. Remote infarct right caudate head. No acute intracranial hemorrhage, midline shift or edema. No new large vascular territory infarct. No intra-axial mass nor extra-axial fluid. Midline fourth ventricle basal cisterns. Brainstem and cerebellum are nonacute. Vascular: Marked atherosclerosis of the carotid siphons bilaterally. Skull: Intact Sinuses/Orbits: No acute finding. Other: None. IMPRESSION: Mild superficial atrophy with chronic left frontal lobe infarct associated with encephalomalacia. Bilateral mild to moderate chronic small vessel ischemic disease and chronic right caudate head lacunar infarct. No acute intracranial appearing abnormality. Electronically Signed   By: Ashley Royalty M.D.   On: 02/26/2018 20:50   US Renal  Result Date: 02/27/2018 CLINICAL DATA:  Acute renal failure. EXAM: RENAL / URINARY TRACT ULTRASOUND COMPLETE COMPARISON:  Ultrasound of August 21, 2007.  CT scan of August 16, 2017. FINDINGS: Right Kidney: Length: 9.6 cm. Increased echogenicity of renal parenchyma is noted. No mass or hydronephrosis visualized. Left Kidney: Length: 9.4 cm. Increased echogenicity of renal parenchyma is noted. No mass or hydronephrosis visualized. Bladder: Appears normal for degree of bladder distention. IMPRESSION: Increased echogenicity of renal parenchyma is noted bilaterally consistent with medical renal disease. No hydronephrosis or renal obstruction is noted. Electronically Signed   By: Marijo Conception, M.D.   On: 02/27/2018 12:02   Dg Chest [facility] 1 View  Result  Date:  02/26/2018 CLINICAL DATA:  Altered mental status EXAM: PORTABLE CHEST 1 VIEW COMPARISON:  06/29/2016 FINDINGS: Normal heart size. Lungs clear. No pneumothorax. No pleural effusion. IMPRESSION: No active disease. Electronically Signed   By: Marybelle Killings M.D.   On: 02/26/2018 20:43   Dg Foot Complete Right  Result Date: 03/01/2018 CLINICAL DATA:  Right foot pain. EXAM: RIGHT FOOT COMPLETE - 3+ VIEW COMPARISON:  None. FINDINGS: Mild-to-moderate degenerative change over the first MTP joint. No evidence of acute fracture or dislocation. Small vessel atherosclerotic disease is present. IMPRESSION: No acute findings. Mild-to-moderate degenerative change of the first MTP joint. Electronically Signed   By: Marin Olp M.D.   On: 03/01/2018 15:00       Today   Subjective:   [name] patient doing well wants to go home  Objective:   Blood pressure (!) 160/96, pulse 81, temperature 98.4 F (36.9 C), temperature source Oral, resp. rate 16, height 6\' 1"  (1.854 m), weight 56.7 kg, SpO2 99 %.  .  Intake/Output Summary (Last 24 hours) at 03/04/2018 1520 Last data filed at 03/04/2018 0957 Gross per 24 hour  Intake 120 ml  Output 1250 ml  Net -1130 ml    Exam VITAL SIGNS: Blood pressure (!) 160/96, pulse 81, temperature 98.4 F (36.9 C), temperature source Oral, resp. rate 16, height 6\' 1"  (1.854 m), weight 56.7 kg, SpO2 99 %.  GENERAL:  57 y.o.-year-old patient lying in the bed with no acute distress.  EYES: Pupils equal, round, reactive to light and accommodation. No scleral icterus. Extraocular muscles intact.  HEENT: Head atraumatic, normocephalic. Oropharynx and nasopharynx clear.  NECK:  Supple, no jugular venous distention. No thyroid enlargement, no tenderness.  LUNGS: Normal breath sounds bilaterally, no wheezing, rales,rhonchi or crepitation. No use of accessory muscles of respiration.  CARDIOVASCULAR: S1, S2 normal. No murmurs, rubs, or gallops.  ABDOMEN: Soft, nontender,  nondistended. Bowel sounds present. No organomegaly or mass.  EXTREMITIES: No pedal edema, cyanosis, or clubbing.  NEUROLOGIC: Cranial nerves II through XII are intact. Muscle strength 5/5 in all extremities. Sensation intact. Gait not checked.  PSYCHIATRIC: The patient is alert and oriented x 3.  SKIN: No obvious rash, lesion, or ulcer.   Data Review     CBC w Diff:  Lab Results  Component Value Date   WBC 9.0 03/04/2018   HGB 10.2 (L) 03/04/2018   HGB 13.1 02/12/2014   HCT 30.4 (L) 03/04/2018   HCT 41.7 02/12/2014   PLT 241 03/04/2018   PLT 322 02/12/2014   LYMPHOPCT 24 02/26/2018   LYMPHOPCT 19.8 02/12/2014   MONOPCT 9 02/26/2018   MONOPCT 8.7 02/12/2014   EOSPCT 1 02/26/2018   EOSPCT 1.8 02/12/2014   BASOPCT 0 02/26/2018   BASOPCT 0.9 02/12/2014   CMP:  Lab Results  Component Value Date   NA 133 (L) 03/04/2018   NA 143 01/20/2014   K 3.7 03/04/2018   K 4.6 01/20/2014   CL 106 03/04/2018   CL 116 (H) 01/20/2014   CO2 21 (L) 03/04/2018   CO2 22 01/20/2014   BUN 25 (H) 03/04/2018   BUN 18 01/20/2014   CREATININE 1.64 (H) 03/04/2018   CREATININE 1.32 (H) 01/20/2014   PROT 6.8 02/26/2018   PROT 5.5 (L) 01/19/2014   ALBUMIN 2.3 (L) 03/03/2018   ALBUMIN 2.4 (L) 01/19/2014   BILITOT 1.2 02/26/2018   BILITOT 0.2 01/19/2014   ALKPHOS 110 02/26/2018   ALKPHOS 39 (L) 01/19/2014   AST 59 (H) 02/26/2018  AST 43 (H) 01/19/2014   ALT 30 02/26/2018   ALT 43 01/19/2014  .  Micro Results Recent Results (from the past 240 hour(s))  Blood Culture (routine x 2)     Status: None   Collection Time: 02/26/18  7:54 PM  Result Value Ref Range Status   Specimen Description BLOOD LEFT ANTECUBITAL  Final   Special Requests   Final    BOTTLES DRAWN AEROBIC AND ANAEROBIC Blood Culture adequate volume   Culture   Final    NO GROWTH 5 DAYS Performed at [facility], [address] [place], [place] [address]    Report Status 03/03/2018 FINAL  Final  Blood Culture  (routine x 2)     Status: None   Collection Time: 02/26/18  7:59 PM  Result Value Ref Range Status   Specimen Description BLOOD BLOOD LEFT HAND  Final   Special Requests   Final    BOTTLES DRAWN AEROBIC AND ANAEROBIC Blood Culture results may not be optimal due to an inadequate volume of blood received in culture bottles   Culture   Final    NO GROWTH 5 DAYS Performed at [facility], [address], [place], [place] [address]    Report Status 03/03/2018 FINAL  Final  Urine Culture     Status: Abnormal   Collection Time: 02/27/18 12:07 AM  Result Value Ref Range Status   Specimen Description   Final    URINE, RANDOM Performed at [facility], [address] [place], [place] [address]    Special Requests   Final    NONE Performed at [facility], [place], [place] [address]    Culture 50,000 COLONIES/mL ENTEROCOCCUS FAECALIS (A)  Final   Report Status 03/01/2018 FINAL  Final   Organism ID, Bacteria ENTEROCOCCUS FAECALIS (A)  Final      Susceptibility   Enterococcus faecalis - MIC*    AMPICILLIN <=2 SENSITIVE Sensitive     LEVOFLOXACIN 1 SENSITIVE Sensitive     NITROFURANTOIN <=16 SENSITIVE Sensitive     VANCOMYCIN 1 SENSITIVE Sensitive     * 50,000 COLONIES/mL ENTEROCOCCUS FAECALIS  C difficile quick scan w PCR reflex     Status: Abnormal   Collection Time: 02/27/18  2:22 AM  Result Value Ref Range Status   C Diff antigen POSITIVE (A) NEGATIVE Final   C Diff toxin NEGATIVE NEGATIVE Final   C Diff interpretation Results are indeterminate. See PCR results.  Final    Comment: Performed at [facility], [address] [place], [place] [address]  C. Diff by PCR, Reflexed     Status: None   Collection Time: 02/27/18  2:22 AM  Result Value Ref Range Status   Toxigenic C. Difficile by PCR NEGATIVE NEGATIVE Final    Comment: Patient is colonized with non toxigenic C. difficile. May not need treatment unless significant symptoms  are present. Performed at [facility], [address], [place], [place] [address]         Code Status Orders  (From admission, onward)         Start     Ordered   02/26/18 2333  Full code  Continuous     02/26/18 2332        Code Status History    Date Active Date Inactive Code Status Order ID Comments User Context   01/15/2018 2158 01/16/2018 1909 Full Code [id number]  Henreitta Leber, MD Inpatient   08/17/2017 0000  08/21/2017 1532 Full Code [id number]  Lance Coon, MD ED   06/29/2016 2124 07/01/2016 1805 Full Code [id number]  Vaughan Basta, MD Inpatient          Follow-up Information    [name], MD Follow up in 6 day(s).   Specialty:  Internal Medicine Contact information: [address] [place] [address] [phone]        Vladimir Crofts, MD. Go on 03/19/2018.   Specialty:  Neurology Why:  @ 10:00 a.m. - foot numbness need EMG Contact information: [facility] [place] [address] [phone]        Anthonette Legato, MD In 1 week.   Specialty:  Internal Medicine Why:  Ann Held information: [place] [address] [phone]        Samara Deist, DPM. Go on 03/17/2018.   Specialty:  Podiatry Why:  @ 1:45 p.m. - ingrown toe nail Contact information: [place] [address] [phone]           Discharge Medications   Allergies as of 03/04/2018      Reactions   Ferrous Gluconate Nausea And Vomiting      Medication List    STOP taking these medications   multivitamin with minerals Tabs tablet     TAKE these medications   amoxicillin 500 MG capsule Commonly known as:  AMOXIL Take 1 capsule (500 mg total) by mouth every 8 (eight) hours for 5 days.   gabapentin 300 MG capsule Commonly known as:  NEURONTIN Take 1 capsule (300 mg total) by mouth 3 (three) times daily.   insulin glargine  100 UNIT/ML injection Commonly known as:  LANTUS Inject 0.15 mLs (15 Units total) into the skin 2 (two) times daily.   lisinopril 10 MG tablet Commonly known as:  PRINIVIL,ZESTRIL Take 10 mg by mouth daily.   metoCLOPramide 10 MG tablet Commonly known as:  REGLAN Take 1 tablet (10 mg total) by mouth 3 (three) times daily before meals.   sertraline 50 MG tablet Commonly known as:  ZOLOFT Take 50 mg by mouth daily.          Total Time in preparing paper work, data evaluation and todays exam - 44 minutes  Dustin Flock M.D on 03/04/2018 at 3:20 PM [place]  [phone]

## 2018-03-05 LAB — GLUCOSE, CAPILLARY: Glucose-Capillary: 568 mg/dL (ref 70–99)

## 2018-03-11 DIAGNOSIS — F331 Major depressive disorder, recurrent, moderate: Secondary | ICD-10-CM | POA: Insufficient documentation

## 2018-03-17 ENCOUNTER — Ambulatory Visit (INDEPENDENT_AMBULATORY_CARE_PROVIDER_SITE_OTHER): Payer: Medicare Other | Admitting: Podiatry

## 2018-03-17 ENCOUNTER — Encounter: Payer: Self-pay | Admitting: Podiatry

## 2018-03-17 DIAGNOSIS — M79674 Pain in right toe(s): Secondary | ICD-10-CM | POA: Diagnosis not present

## 2018-03-17 DIAGNOSIS — B351 Tinea unguium: Secondary | ICD-10-CM

## 2018-03-17 DIAGNOSIS — M79675 Pain in left toe(s): Secondary | ICD-10-CM | POA: Diagnosis not present

## 2018-03-17 DIAGNOSIS — I639 Cerebral infarction, unspecified: Secondary | ICD-10-CM | POA: Insufficient documentation

## 2018-03-17 DIAGNOSIS — E1142 Type 2 diabetes mellitus with diabetic polyneuropathy: Secondary | ICD-10-CM | POA: Diagnosis not present

## 2018-03-17 NOTE — Progress Notes (Signed)
This patient presents to the office with chief complaint of long thick nails and diabetic feet.  This patient  says there  is  no pain and discomfort in his  feet.  This patient says there are long thick painful nails.  These nails are painful walking and wearing shoes.  Patient has no history of infection or drainage from both feet.  Patient is unable to  self treat his own nails . This patient presents  to the office today for treatment of the  long nails and a foot evaluation due to history of  diabetes.  General Appearance  Alert, conversant and in no acute stress.  Vascular  Dorsalis pedis and posterior tibial  pulses are weakly  palpable  bilaterally.  Capillary return is within normal limits  bilaterally. Temperature is within normal limits  bilaterally.  Neurologic  Senn-Weinstein monofilament wire test diminished   bilaterally. Muscle power within normal limits bilaterally.  Nails Thick disfigured discolored nails with subungual debris  from hallux to fifth toes bilaterally. No evidence of bacterial infection or drainage bilaterally.  Orthopedic  No limitations of motion of motion feet .  No crepitus or effusions noted.  No bony pathology or digital deformities noted.  Skin  normotropic skin with no porokeratosis noted bilaterally.  No signs of infections or ulcers noted.     Onychomycosis  Diabetes with no foot complications  IE  Debride nails x 10.  A diabetic foot exam was performed and there is  evidence of both  vascular or neurologic pathology.   RTC 3 months.   Gardiner Barefoot DPM

## 2018-03-19 DIAGNOSIS — R200 Anesthesia of skin: Secondary | ICD-10-CM | POA: Insufficient documentation

## 2018-03-19 DIAGNOSIS — R202 Paresthesia of skin: Secondary | ICD-10-CM | POA: Insufficient documentation

## 2018-05-19 ENCOUNTER — Ambulatory Visit: Payer: Medicare HMO | Attending: Internal Medicine

## 2018-05-19 DIAGNOSIS — M6281 Muscle weakness (generalized): Secondary | ICD-10-CM | POA: Diagnosis not present

## 2018-05-19 DIAGNOSIS — R2689 Other abnormalities of gait and mobility: Secondary | ICD-10-CM | POA: Diagnosis not present

## 2018-05-19 DIAGNOSIS — I69353 Hemiplegia and hemiparesis following cerebral infarction affecting right non-dominant side: Secondary | ICD-10-CM | POA: Diagnosis not present

## 2018-05-19 NOTE — Therapy (Signed)
[place] MAIN [facility] SERVICES [address] [place], [place], [address] Phone: [phone]   Fax:  [phone]  Physical Therapy Evaluation  Patient Details  Name: [name] MRN: [id number] Date of Birth: 1961/02/09 Referring Provider (PT): [name]   Encounter Date: 05/19/2018  PT End of Session - 05/19/18 1237    Visit Number  1    Number of Visits  16    Date for PT Re-Evaluation  07/17/17    Authorization Type  [employer]     Authorization Time Period  05/19/18-07/18/18    PT Start Time  1115    PT Stop Time  1153    PT Time Calculation (min)  38 min    Activity Tolerance  Patient tolerated treatment well    Behavior During Therapy  [facility] for tasks assessed/performed       Past Medical History:  Diagnosis Date  . Chronic kidney disease   . Diabetes mellitus without complication ([place])   . ETOH abuse   . Hyperlipidemia   . Hypertension   . Stroke [facility])     Past Surgical History:  Procedure Laterality Date  . ESOPHAGOGASTRODUODENOSCOPY (EGD) WITH PROPOFOL N/A 07/01/2016   Procedure: ESOPHAGOGASTRODUODENOSCOPY (EGD) WITH PROPOFOL;  Surgeon: San Jetty, MD;  Location: [facility] ENDOSCOPY;  Service: General;  Laterality: N/A;    There were no vitals filed for this visit.   Subjective Assessment - 05/19/18 1229    Subjective  Pt reports to OPPT for longstanding Rt hemiplegia after several strokes.     Pertinent History  Pt reports having difficulty with his RLE and RUE function, which started 10YA after his 2nd stroke, but now after 5 strokes he continues to have trouble. Pt has had trouble with maintaining his yard, particular continued walking required to mow his grass, RLE fatiguing quickly requiring rest. Pt is not able to perform his usual grocery shopping. Pt only has 1 step at home, hence has been able to access home without a problem. Pt also reports falling 2x in yard in the past 3 months. Pt reports having gone  through PT previously for the same issue without much noted improvement. Pt is left handed since childhood.     How long can you walk comfortably?  a short grocery trip     Patient Stated Goals  improve ability to tolerate long distance AMB or ride a bike in community    Currently in Pain?  No/denies         [facility] PT Assessment - 05/19/18 0001      Assessment   Medical Diagnosis  Rt hemiplegia s/p CVA     Referring Provider (PT)  Vishwanath Hande    Hand Dominance  Left    Prior Therapy  HHPT >1YA      Precautions   Precautions  None      Balance Screen   Has the patient fallen in the past 6 months  Yes    How many times?  2    Has the patient had a decrease in activity level because of a fear of falling?   Yes    Is the patient reluctant to leave their home because of a fear of falling?   Yes      Prior Function   Level of Independence  Independent with basic ADLs    Vocation  On disability      Sensation   Light Touch  Appears Intact  AMB: 462ft 2'16"  -5xSTS: 16.50sec hands free, Chair + airex   MMT: Hip flexion: 5/5 Left, 4+/5 Right  Horizontal ABduction: 5/5 Left, 4+/5 Right Horizontal Adduction: 5/5 Left, 4+/5 Right Rt hip ER: 4+/5, IR 4+/5 Lt Hip IR: 4+/5, ER 5/5  Knee flexion 5/5 bilat, extension 5/5 bilat  Ankle DF: 5/5 bilat Ankle eversion: 5/5 Left, 4/5 right  Hip Abduction 4+5 Left, 4/5 Right  Prone Hip Extension: -knees bent: 3-/5 left, 1/5 right -knee straights: 3-/5 bilat   -Narrow Stance Eyes closed: 30+ sec (heavy postural sway)  -Single Leg Stance: 9 sec Left (3 trials); <2 seconds Right (   Objective measurements completed on examination: See above findings.              PT Education - 05/19/18 1237    Education Details  importance of sleep hygiene and prioritizing sufficient sleep volume for post stroke brain healing.     Person(s) Educated  Patient    Methods  Explanation    Comprehension  Verbalized understanding        PT Short Term Goals - 05/19/18 1248      PT SHORT TERM GOAL #1   Title  After 4 weeks patient will demonstrate 5xSTS hands free from chair +airex in less than 11 seconds.     Status  New        PT Long Term Goals - 05/19/18 1246      PT LONG TERM GOAL #1   Title  After 8 weeks pt will demonstrate 5xSTS from chair hands free in <13 seconds.     Status  New      PT LONG TERM GOAL #2   Title  After 8 weeks patient will demonstrate SLS >10sec bilat to improve safety at home.     Status  New      PT LONG TERM GOAL #3   Title  After 8 weeks patient will demonstrate hip extension strength 3/5 left glutemax to improve energy conservation in gait.     Status  New      PT LONG TERM GOAL #4   Title  After 8 weeks pt will demonstrate performance of 6MWT averaging gait speed >1.65m/s.     Status  New             Plan - 05/19/18 1240    Clinical Impression Statement  Pt comes to PT for evaluation after continued post CVA Rt hemiplegia limting his daily function. Examination reveals weakness in the Right hip globally most limited in hip extension from glute max. Pt has decreased SLS stability and decreased gait speed in short distance walking. Pt will benefit from skilled PT assessment to address deficiits and impairments to improve independence in IADL and participation in household and social activity. Pt also c/o RUE dysfunciton but this will be deferred until later date.     History and Personal Factors relevant to plan of care:  History of sleep disturbance, ETOH abuse and DUI, limitred transportation, 5 CVA in past 10 years.     Clinical Presentation  Stable    Clinical Presentation due to:  objective tests and measures    Clinical Decision Making  Moderate    Rehab Potential  Fair    PT Frequency  2x / week    PT Duration  8 weeks    PT Treatment/Interventions  ADLs/Self Care Home Management;Electrical Stimulation;DME Instruction;Gait training;Therapeutic exercise;Therapeutic  activities;Functional mobility training;Balance training;Neuromuscular re-education;Patient/family education    PT Next  Visit Plan  establish HEP for basic strengthing at home.     PT Home Exercise Plan  not yet established     Recommended Other Services  OT to address RUE dysfunction    Consulted and Agree with Plan of Care  Patient       Patient will benefit from skilled therapeutic intervention in order to improve the following deficits and impairments:  Abnormal gait, Decreased balance, Decreased endurance, Decreased mobility, Difficulty walking, Decreased strength, Postural dysfunction, Improper body mechanics  Visit Diagnosis: Hemiplegia and hemiparesis following cerebral infarction affecting right non-dominant side (HCC)  Other abnormalities of gait and mobility  Muscle weakness (generalized)     Problem List Patient Active Problem List   Diagnosis Date Noted  . CVA (cerebral vascular accident) ([place]) 03/17/2018  . UTI (urinary tract infection) 02/27/2018  . Hypoglycemia 01/15/2018  . Type 2 diabetes mellitus without complication, with long-term current use of insulin ([place]) 08/27/2017  . Protein-calorie malnutrition, severe 08/19/2017  . Pancreatitis, acute 08/16/2017  . DKA (diabetic ketoacidoses) ([place]) 08/16/2017  . HTN (hypertension) 08/16/2017  . HLD (hyperlipidemia) 08/16/2017  . Carotid stenosis 08/02/2016  . GERD (gastroesophageal reflux disease) 08/02/2016  . History of esophagogastroduodenoscopy (EGD) 07/01/2016  . History of recent blood transfusion 07/01/2016  . Acute renal failure superimposed on stage 3 chronic kidney disease ([place]) 07/01/2016  . Hyponatremia 07/01/2016  . Uncontrolled diabetes mellitus ([place]) 07/01/2016  . Alcohol abuse 07/01/2016  . Monilial esophagitis ([place]) 07/01/2016  . Tobacco abuse 07/01/2016  . Confusion 06/29/2016  . Iron deficiency anemia 06/29/2016  . Coagulopathy ([place]) 06/29/2016   12:58 PM, 05/19/18 Etta Grandchild, PT,  DPT Physical Therapist - [facility]  Outpatient Physical Therapy- [place] [phone]     Etta Grandchild 05/19/2018, 12:57 PM  [place] MAIN [facility] SERVICES [address] [place], [place], [address] Phone: [phone]   Fax:  [phone]  Name: [name] MRN: [id number] Date of Birth: December 31, 1960

## 2018-05-22 ENCOUNTER — Ambulatory Visit: Payer: Medicare HMO

## 2018-05-26 ENCOUNTER — Ambulatory Visit: Payer: Medicare HMO

## 2018-05-26 DIAGNOSIS — R2689 Other abnormalities of gait and mobility: Secondary | ICD-10-CM | POA: Diagnosis not present

## 2018-05-26 DIAGNOSIS — M6281 Muscle weakness (generalized): Secondary | ICD-10-CM | POA: Diagnosis not present

## 2018-05-26 DIAGNOSIS — I69353 Hemiplegia and hemiparesis following cerebral infarction affecting right non-dominant side: Secondary | ICD-10-CM | POA: Diagnosis not present

## 2018-05-26 NOTE — Therapy (Signed)
[place] MAIN [facility] SERVICES [address] [place], [place], [address] Phone: [phone]   Fax:  [phone]  Physical Therapy Treatment  Patient Details  Name: [name] MRN: [id number] Date of Birth: 22-Apr-1961 Referring Provider (PT): [name]   Encounter Date: 05/26/2018  PT End of Session - 05/26/18 1115    Visit Number  2    Number of Visits  16    Date for PT Re-Evaluation  07/17/17    Authorization Type  [employer]     Authorization Time Period  05/19/18-07/18/18    PT Start Time  1058    PT Stop Time  1138    PT Time Calculation (min)  40 min    Activity Tolerance  Patient tolerated treatment well;Patient limited by fatigue    Behavior During Therapy  [facility] for tasks assessed/performed       Past Medical History:  Diagnosis Date  . Chronic kidney disease   . Diabetes mellitus without complication ([place])   . ETOH abuse   . Hyperlipidemia   . Hypertension   . Stroke [facility])     Past Surgical History:  Procedure Laterality Date  . ESOPHAGOGASTRODUODENOSCOPY (EGD) WITH PROPOFOL N/A 07/01/2016   Procedure: ESOPHAGOGASTRODUODENOSCOPY (EGD) WITH PROPOFOL;  Surgeon: San Jetty, MD;  Location: [facility] ENDOSCOPY;  Service: General;  Laterality: N/A;    There were no vitals filed for this visit.  Subjective Assessment - 05/26/18 1107    Subjective  Pt doing well today. He stayed in all weekend, did not go out. He denies any acute changes single last visit.     Pertinent History  Pt reports having difficulty with his RLE and RUE function, which started 10YA after his 2nd stroke, but now after 5 strokes he continues to have trouble. Pt has had trouble with maintaining his yard, particular continued walking required to mow his grass, RLE fatiguing quickly requiring rest. Pt is not able to perform his usual grocery shopping. Pt only has 1 step at home, hence has been able to access home without a problem. Pt also reports falling  2x in yard in the past 3 months. Pt reports having gone through PT previously for the same issue without much noted improvement. Pt is left handed since childhood.     How long can you walk comfortably?  a short grocery trip        Intervention This Date: -AMB in hall: 647ft, no assistive device. 4:46 or 0.42m/s    -STS from [place] elevated 2x10, wide stance.  -SLR Right 2x10 in hook-lying (noted quads lag)  -SAQ: Right 5lb ankle weight, 2x15  -Right Bent knee raise 2x15 (~65 degrees to end range)   -Prone Straight leg raise 2x10 bilat, 2 pillows under ABD  -Left side-lying Rt hip Abduction isometric 2x10x3sec hold  -Left sidelying Right Clam 2x12 (AA/ROM for full available range, mostly tactile cues) -Quadruped Hip Extension: 1x8 bilat, heavy facilitation for trunk stabilization for Right side, *watch RUE weight bearing.      PT Short Term Goals - 05/19/18 1248      PT SHORT TERM GOAL #1   Title  After 4 weeks patient will demonstrate 5xSTS hands free from chair +airex in less than 11 seconds.     Status  New        PT Long Term Goals - 05/19/18 1246      PT LONG TERM GOAL #1   Title  After 8 weeks pt will  demonstrate 5xSTS from chair hands free in <13 seconds.     Status  New      PT LONG TERM GOAL #2   Title  After 8 weeks patient will demonstrate SLS >10sec bilat to improve safety at home.     Status  New      PT LONG TERM GOAL #3   Title  After 8 weeks patient will demonstrate hip extension strength 3/5 left glutemax to improve energy conservation in gait.     Status  New      PT LONG TERM GOAL #4   Title  After 8 weeks pt will demonstrate performance of 6MWT averaging gait speed >1.66m/s.     Status  New            Plan - 05/26/18 1117    Clinical Impression Statement  Started therex regimen this date, establishing exercises and intensities based on examination last date. Pt toelrating well, notable signs of fatigue at times, but highly motivated.  Pt  needs heavy cuing at times for form correction. He typically does more reps than instructed for unclear reasons, as he offers no explanation when asked.     Rehab Potential  Fair    PT Frequency  2x / week    PT Duration  8 weeks    PT Treatment/Interventions  ADLs/Self Care Home Management;Electrical Stimulation;DME Instruction;Gait training;Therapeutic exercise;Therapeutic activities;Functional mobility training;Balance training;Neuromuscular re-education;Patient/family education    PT Next Visit Plan  establish HEP for basic strengthing at home.     PT Home Exercise Plan  not yet established     Consulted and Agree with Plan of Care  Patient       Patient will benefit from skilled therapeutic intervention in order to improve the following deficits and impairments:  Abnormal gait, Decreased balance, Decreased endurance, Decreased mobility, Difficulty walking, Decreased strength, Postural dysfunction, Improper body mechanics  Visit Diagnosis: Hemiplegia and hemiparesis following cerebral infarction affecting right non-dominant side (HCC)  Other abnormalities of gait and mobility  Muscle weakness (generalized)     Problem List Patient Active Problem List   Diagnosis Date Noted  . CVA (cerebral vascular accident) ([place]) 03/17/2018  . UTI (urinary tract infection) 02/27/2018  . Hypoglycemia 01/15/2018  . Type 2 diabetes mellitus without complication, with long-term current use of insulin ([place]) 08/27/2017  . Protein-calorie malnutrition, severe 08/19/2017  . Pancreatitis, acute 08/16/2017  . DKA (diabetic ketoacidoses) ([place]) 08/16/2017  . HTN (hypertension) 08/16/2017  . HLD (hyperlipidemia) 08/16/2017  . Carotid stenosis 08/02/2016  . GERD (gastroesophageal reflux disease) 08/02/2016  . History of esophagogastroduodenoscopy (EGD) 07/01/2016  . History of recent blood transfusion 07/01/2016  . Acute renal failure superimposed on stage 3 chronic kidney disease ([place]) 07/01/2016  .  Hyponatremia 07/01/2016  . Uncontrolled diabetes mellitus ([place]) 07/01/2016  . Alcohol abuse 07/01/2016  . Monilial esophagitis ([place]) 07/01/2016  . Tobacco abuse 07/01/2016  . Confusion 06/29/2016  . Iron deficiency anemia 06/29/2016  . Coagulopathy ([place]) 06/29/2016   11:44 AM, 05/26/18 Etta Grandchild, PT, DPT Physical Therapist - [facility]  Outpatient Physical [place] [phone]     Etta Grandchild 05/26/2018, 11:34 AM  [place] MAIN [facility] SERVICES [address] [place], [place], [address] Phone: [phone]   Fax:  [phone]  Name: [name] MRN: [id number] Date of Birth: 1960/06/22

## 2018-05-28 ENCOUNTER — Ambulatory Visit: Payer: Medicare HMO

## 2018-06-04 ENCOUNTER — Ambulatory Visit: Payer: Medicare HMO | Admitting: Physical Therapy

## 2018-06-09 ENCOUNTER — Ambulatory Visit: Payer: Medicare HMO

## 2018-06-11 ENCOUNTER — Ambulatory Visit: Payer: Medicare HMO

## 2018-06-19 ENCOUNTER — Ambulatory Visit: Payer: Medicare Other | Admitting: Podiatry

## 2018-06-19 ENCOUNTER — Ambulatory Visit: Payer: Medicare HMO | Attending: Internal Medicine

## 2018-06-19 DIAGNOSIS — I69353 Hemiplegia and hemiparesis following cerebral infarction affecting right non-dominant side: Secondary | ICD-10-CM | POA: Diagnosis not present

## 2018-06-19 DIAGNOSIS — R2689 Other abnormalities of gait and mobility: Secondary | ICD-10-CM | POA: Diagnosis not present

## 2018-06-19 DIAGNOSIS — M6281 Muscle weakness (generalized): Secondary | ICD-10-CM | POA: Diagnosis not present

## 2018-06-19 NOTE — Therapy (Signed)
[place] MAIN [facility] SERVICES [address] [place], [place], [address] Phone: [phone]   Fax:  [phone]  Physical Therapy Treatment  Patient Details  Name: [name] MRN: [id number] Date of Birth: 03/10/1961 Referring Provider (PT): [name]   Encounter Date: 06/19/2018  PT End of Session - 06/19/18 1445    Visit Number  3    Number of Visits  16    Date for PT Re-Evaluation  07/17/17    Authorization Type  [employer]     Authorization Time Period  05/19/18-07/18/18    PT Start Time  1437    PT Stop Time  1515    PT Time Calculation (min)  38 min    Activity Tolerance  Patient tolerated treatment well;Patient limited by fatigue;No increased pain    Behavior During Therapy  [facility] for tasks assessed/performed       Past Medical History:  Diagnosis Date  . Chronic kidney disease   . Diabetes mellitus without complication ([place])   . ETOH abuse   . Hyperlipidemia   . Hypertension   . Stroke [facility])     Past Surgical History:  Procedure Laterality Date  . ESOPHAGOGASTRODUODENOSCOPY (EGD) WITH PROPOFOL N/A 07/01/2016   Procedure: ESOPHAGOGASTRODUODENOSCOPY (EGD) WITH PROPOFOL;  Surgeon: San Jetty, MD;  Location: [facility] ENDOSCOPY;  Service: General;  Laterality: N/A;    There were no vitals filed for this visit.  Subjective Assessment - 06/19/18 1444    Subjective  Pt reports doing well today. Has been working on HEP intermittently at home. No updates to pain or medical status since prior visit. No falls, but balance remains limited at times.     Pertinent History  Pt reports having difficulty with his RLE and RUE function, which started 10YA after his 2nd stroke, but now after 5 strokes he continues to have trouble. Pt has had trouble with maintaining his yard, particular continued walking required to mow his grass, RLE fatiguing quickly requiring rest. Pt is not able to perform his usual grocery shopping. Pt only has 1 step  at home, hence has been able to access home without a problem. Pt also reports falling 2x in yard in the past 3 months. Pt reports having gone through PT previously for the same issue without much noted improvement. Pt is left handed since childhood.     Currently in Pain?  No/denies        Intervention this date: *5xSTS, hands free, chair: 20.1sec -Rt leg Press (seat 7) 1x6 @75lb , 2x10 @ 60lbs -Rt SLR (12" height) 2x10 (VC for TKE)  -Rt glute set, foot on 2 pillows: 2x10x3secH  -Rt SAQ 2x10 @ 7.5lb cuff weight (verbal cues for controlled eccentric phase) *last 3-4 modA for TKE full range and isometric hold+eccentric lowering  *eventually finished with 6lb at ankle -Left side-lying Rt hip Abduction isometric 2x10x3sec hold  -Left side-lying Rt hip clam 2x10 -Airex pad stance c LUE overhead press x15, 3lb weight (2 sets) minGuard assist to MinAx1    PT Short Term Goals - 05/19/18 1248      PT SHORT TERM GOAL #1   Title  After 4 weeks patient will demonstrate 5xSTS hands free from chair +airex in less than 11 seconds.     Status  New        PT Long Term Goals - 05/19/18 1246      PT LONG TERM GOAL #1   Title  After 8 weeks pt will  demonstrate 5xSTS from chair hands free in <13 seconds.     Status  New      PT LONG TERM GOAL #2   Title  After 8 weeks patient will demonstrate SLS >10sec bilat to improve safety at home.     Status  New      PT LONG TERM GOAL #3   Title  After 8 weeks patient will demonstrate hip extension strength 3/5 left glutemax to improve energy conservation in gait.     Status  New      PT LONG TERM GOAL #4   Title  After 8 weeks pt will demonstrate performance of 6MWT averaging gait speed >1.62m/s.     Status  New            Plan - 06/19/18 1446    Clinical Impression Statement  Continued with strengtheningf intervention this date, targetting areas identified in exam to be weak. 5xSTS retested, which patien tis now able to perform from standard  surface height hands free: 20.1sec. Integrated single leg hip extension into routine this date.     Rehab Potential  Fair    PT Frequency  2x / week    PT Duration  8 weeks    PT Treatment/Interventions  ADLs/Self Care Home Management;Electrical Stimulation;DME Instruction;Gait training;Therapeutic exercise;Therapeutic activities;Functional mobility training;Balance training;Neuromuscular re-education;Patient/family education    PT Next Visit Plan  Continue with isolated strengthening     PT Home Exercise Plan  No updates this session     Consulted and Agree with Plan of Care  Patient       Patient will benefit from skilled therapeutic intervention in order to improve the following deficits and impairments:  Abnormal gait, Decreased balance, Decreased endurance, Decreased mobility, Difficulty walking, Decreased strength, Postural dysfunction, Improper body mechanics  Visit Diagnosis: Hemiplegia and hemiparesis following cerebral infarction affecting right non-dominant side (HCC)  Other abnormalities of gait and mobility  Muscle weakness (generalized)     Problem List Patient Active Problem List   Diagnosis Date Noted  . CVA (cerebral vascular accident) ([place]) 03/17/2018  . UTI (urinary tract infection) 02/27/2018  . Hypoglycemia 01/15/2018  . Type 2 diabetes mellitus without complication, with long-term current use of insulin ([place]) 08/27/2017  . Protein-calorie malnutrition, severe 08/19/2017  . Pancreatitis, acute 08/16/2017  . DKA (diabetic ketoacidoses) ([place]) 08/16/2017  . HTN (hypertension) 08/16/2017  . HLD (hyperlipidemia) 08/16/2017  . Carotid stenosis 08/02/2016  . GERD (gastroesophageal reflux disease) 08/02/2016  . History of esophagogastroduodenoscopy (EGD) 07/01/2016  . History of recent blood transfusion 07/01/2016  . Acute renal failure superimposed on stage 3 chronic kidney disease ([place]) 07/01/2016  . Hyponatremia 07/01/2016  . Uncontrolled diabetes mellitus  ([place]) 07/01/2016  . Alcohol abuse 07/01/2016  . Monilial esophagitis ([place]) 07/01/2016  . Tobacco abuse 07/01/2016  . Confusion 06/29/2016  . Iron deficiency anemia 06/29/2016  . Coagulopathy ([place]) 06/29/2016    2:59 PM, 06/19/18 Etta Grandchild, PT, DPT Physical Therapist - [facility]  Outpatient Physical Therapy- [place] 06/19/2018, 2:49 PM  [place] MAIN [facility] SERVICES [address] [place], [place], [address] Phone: [phone]   Fax:  [phone]  Name: DELYLE WEIDER MRN: [id number] Date of Birth: August 20, 1960

## 2018-06-24 ENCOUNTER — Ambulatory Visit: Payer: Medicare HMO

## 2018-06-26 ENCOUNTER — Ambulatory Visit: Payer: Medicare HMO

## 2018-07-02 ENCOUNTER — Ambulatory Visit: Payer: Medicare HMO

## 2018-07-02 VITALS — BP 175/100 | HR 88

## 2018-07-02 DIAGNOSIS — M6281 Muscle weakness (generalized): Secondary | ICD-10-CM

## 2018-07-02 DIAGNOSIS — I69353 Hemiplegia and hemiparesis following cerebral infarction affecting right non-dominant side: Secondary | ICD-10-CM | POA: Diagnosis not present

## 2018-07-02 DIAGNOSIS — R2689 Other abnormalities of gait and mobility: Secondary | ICD-10-CM | POA: Diagnosis not present

## 2018-07-02 DIAGNOSIS — I10 Essential (primary) hypertension: Secondary | ICD-10-CM | POA: Diagnosis not present

## 2018-07-02 NOTE — Therapy (Signed)
[place] MAIN [facility] SERVICES [address] [place], [place], [address] Phone: [phone]   Fax:  [phone]  Physical Therapy Treatment  Patient Details  Name: [name] MRN: [id number] Date of Birth: 07-30-1960 Referring Provider (PT): [name]   Encounter Date: 07/02/2018  PT End of Session - 07/02/18 0853    Visit Number  4    Number of Visits  16    Date for PT Re-Evaluation  07/17/17    Authorization Type  [employer]     Authorization Time Period  05/19/18-07/18/18    PT Start Time  0847    PT Stop Time  0930    PT Time Calculation (min)  43 min    Activity Tolerance  Patient tolerated treatment well;Patient limited by fatigue;No increased pain    Behavior During Therapy  WFL for tasks assessed/performed       Past Medical History:  Diagnosis Date  . Chronic kidney disease   . Diabetes mellitus without complication ([place])   . ETOH abuse   . Hyperlipidemia   . Hypertension   . Stroke [facility])     Past Surgical History:  Procedure Laterality Date  . ESOPHAGOGASTRODUODENOSCOPY (EGD) WITH PROPOFOL N/A 07/01/2016   Procedure: ESOPHAGOGASTRODUODENOSCOPY (EGD) WITH PROPOFOL;  Surgeon: San Jetty, MD;  Location: [facility] ENDOSCOPY;  Service: General;  Laterality: N/A;    Vitals:   07/02/18 0851  BP: (!) 175/100  Pulse: 88  SpO2: 100%    Subjective Assessment - 07/02/18 0850    Subjective  Pt reports doing well today. He is performing his HEP. No updates to pain or medical status since prior visit. No falls.    Pertinent History  Pt reports having difficulty with his RLE and RUE function, which started 10YA after his 2nd stroke, but now after 5 strokes he continues to have trouble. Pt has had trouble with maintaining his yard, particular continued walking required to mow his grass, RLE fatiguing quickly requiring rest. Pt is not able to perform his usual grocery shopping. Pt only has 1 step at home, hence has been able to  access home without a problem. Pt also reports falling 2x in yard in the past 3 months. Pt reports having gone through PT previously for the same issue without much noted improvement. Pt is left handed since childhood.     Currently in Pain?  No/denies        TREATMENT   Ther-ex  Hookling R SLR flexion x 10 with cues for breathing; Hooklying R SLR hip abduction with manual resistance x 10;  Hooklying bridges with LLE extended 4-6" past RLE to bias R side 2 x 10; Hooklying SAQ over bolster with manual resistance 2 x 10; Hookling clams with manual resistance 2 x 10; Hooklying adductor squeeze with manual resistance 2 x 10; L sidelying R hip abduction circles CW x 10, CCW x 10;   Pt educated throughout session about proper posture and technique with exercises. Improved exercise technique, movement at target joints, use of target muscles after min to mod verbal, visual, tactile cues.    Patient's BP was elevated upon arrival. He took his BP meds this morning. Pt reports that he has a cuff at home. He states that he also has an upcoming appointment this month with his PCP. Pt encouraged to take blood pressure multiple times and day and record the readings to take with him to his PCP in case his medications needed to be adjusted.  BP re-taken during session and remains elevated at 179/102. Pt states that he is already planning on talking to his PCP regarding paperwork he needs completed. Session limited to mat exercises due to elevated BP with his diastolic pressure being the most limiting to activity. He is able to complete all exercises as instructed but continues to demonstrate decreased RLE strength. Pt encouraged to continue HEP and follow-up as scheduled.                        PT Short Term Goals - 05/19/18 1248      PT SHORT TERM GOAL #1   Title  After 4 weeks patient will demonstrate 5xSTS hands free from chair +airex in less than 11 seconds.     Status  New         PT Long Term Goals - 05/19/18 1246      PT LONG TERM GOAL #1   Title  After 8 weeks pt will demonstrate 5xSTS from chair hands free in <13 seconds.     Status  New      PT LONG TERM GOAL #2   Title  After 8 weeks patient will demonstrate SLS >10sec bilat to improve safety at home.     Status  New      PT LONG TERM GOAL #3   Title  After 8 weeks patient will demonstrate hip extension strength 3/5 left glutemax to improve energy conservation in gait.     Status  New      PT LONG TERM GOAL #4   Title  After 8 weeks pt will demonstrate performance of 6MWT averaging gait speed >1.37m/s.     Status  New            Plan - 07/02/18 6222    Clinical Impression Statement  Patient's BP was elevated upon arrival. He took his BP meds this morning. Pt reports that he has a cuff at home. He states that he also has an upcoming appointment this month with his PCP. Pt encouraged to take blood pressure multiple times and day and record the readings to take with him to his PCP in case his medications needed to be adjusted. BP re-taken during session and remains elevated at 179/102. Pt states that he is already planning on talking to his PCP regarding paperwork he needs completed. Session limited to mat exercises due to elevated BP with his diastolic pressure being the most limiting to activity. He is able to complete all exercises as instructed but continues to demonstrate decreased RLE strength. Pt encouraged to continue HEP and follow-up as scheduled.    Rehab Potential  Fair    PT Frequency  2x / week    PT Duration  8 weeks    PT Treatment/Interventions  ADLs/Self Care Home Management;Electrical Stimulation;DME Instruction;Gait training;Therapeutic exercise;Therapeutic activities;Functional mobility training;Balance training;Neuromuscular re-education;Patient/family education    PT Next Visit Plan  Continue with isolated strengthening     PT Home Exercise Plan  No updates this session      Consulted and Agree with Plan of Care  Patient       Patient will benefit from skilled therapeutic intervention in order to improve the following deficits and impairments:  Abnormal gait, Decreased balance, Decreased endurance, Decreased mobility, Difficulty walking, Decreased strength, Postural dysfunction, Improper body mechanics  Visit Diagnosis: Hemiplegia and hemiparesis following cerebral infarction affecting right non-dominant side (HCC)  Muscle weakness (generalized)  Other abnormalities of gait and  mobility     Problem List Patient Active Problem List   Diagnosis Date Noted  . CVA (cerebral vascular accident) ([place]) 03/17/2018  . UTI (urinary tract infection) 02/27/2018  . Hypoglycemia 01/15/2018  . Type 2 diabetes mellitus without complication, with long-term current use of insulin ([place]) 08/27/2017  . Protein-calorie malnutrition, severe 08/19/2017  . Pancreatitis, acute 08/16/2017  . DKA (diabetic ketoacidoses) ([place]) 08/16/2017  . HTN (hypertension) 08/16/2017  . HLD (hyperlipidemia) 08/16/2017  . Carotid stenosis 08/02/2016  . GERD (gastroesophageal reflux disease) 08/02/2016  . History of esophagogastroduodenoscopy (EGD) 07/01/2016  . History of recent blood transfusion 07/01/2016  . Acute renal failure superimposed on stage 3 chronic kidney disease ([place]) 07/01/2016  . Hyponatremia 07/01/2016  . Uncontrolled diabetes mellitus ([place]) 07/01/2016  . Alcohol abuse 07/01/2016  . Monilial esophagitis ([place]) 07/01/2016  . Tobacco abuse 07/01/2016  . Confusion 06/29/2016  . Iron deficiency anemia 06/29/2016  . Coagulopathy ([place]) 06/29/2016   Phillips Grout PT, DPT, GCS  Henchy Mccauley 07/02/2018, 10:37 AM  [place] MAIN [facility] SERVICES [address] [place], [place], [address] Phone: [phone]   Fax:  [phone]  Name: [name] MRN: [id number] Date of Birth: 1960/12/27

## 2018-07-09 ENCOUNTER — Ambulatory Visit: Payer: Medicare HMO | Admitting: Physical Therapy

## 2018-07-14 ENCOUNTER — Ambulatory Visit: Payer: Medicare HMO | Attending: Internal Medicine | Admitting: Physical Therapy

## 2018-07-14 VITALS — BP 159/86

## 2018-07-14 DIAGNOSIS — R2689 Other abnormalities of gait and mobility: Secondary | ICD-10-CM | POA: Insufficient documentation

## 2018-07-14 DIAGNOSIS — M6281 Muscle weakness (generalized): Secondary | ICD-10-CM | POA: Insufficient documentation

## 2018-07-14 DIAGNOSIS — I69353 Hemiplegia and hemiparesis following cerebral infarction affecting right non-dominant side: Secondary | ICD-10-CM | POA: Diagnosis not present

## 2018-07-14 NOTE — Therapy (Signed)
[place] MAIN [facility] SERVICES [address] [place], [place], [address] Phone: [phone]   Fax:  [phone]  Physical Therapy Treatment  Patient Details  Name: [name] MRN: [id number] Date of Birth: 12-12-1960 Referring Provider (PT): [name]   Encounter Date: 07/14/2018  PT End of Session - 07/14/18 1155    Visit Number  5    Number of Visits  16    Date for PT Re-Evaluation  07/17/17    Authorization Type  [employer]     Authorization Time Period  05/19/18-07/18/18    PT Start Time  1324    PT Stop Time  1225    PT Time Calculation (min)  40 min    Activity Tolerance  Patient tolerated treatment well;Patient limited by fatigue;No increased pain    Behavior During Therapy  WFL for tasks assessed/performed       Past Medical History:  Diagnosis Date  . Chronic kidney disease   . Diabetes mellitus without complication ([place])   . ETOH abuse   . Hyperlipidemia   . Hypertension   . Stroke [facility])     Past Surgical History:  Procedure Laterality Date  . ESOPHAGOGASTRODUODENOSCOPY (EGD) WITH PROPOFOL N/A 07/01/2016   Procedure: ESOPHAGOGASTRODUODENOSCOPY (EGD) WITH PROPOFOL;  Surgeon: San Jetty, MD;  Location: [facility] ENDOSCOPY;  Service: General;  Laterality: N/A;    Vitals:   07/14/18 1216  BP: (!) 159/86    Subjective Assessment - 07/14/18 1154    Subjective  Patient reports that he fell outside in his yard on saturday and has a laceration on his left elbow. He put bandaids and a sock on it because it was continuing to have a discharge.     Pertinent History  Pt reports having difficulty with his RLE and RUE function, which started 10YA after his 2nd stroke, but now after 5 strokes he continues to have trouble. Pt has had trouble with maintaining his yard, particular continued walking required to mow his grass, RLE fatiguing quickly requiring rest. Pt is not able to perform his usual grocery shopping. Pt only has 1  step at home, hence has been able to access home without a problem. Pt also reports falling 2x in yard in the past 3 months. Pt reports having gone through PT previously for the same issue without much noted improvement. Pt is left handed since childhood.     How long can you walk comfortably?  a short grocery trip     Patient Stated Goals  improve ability to tolerate long distance AMB or ride a bike in community    Currently in Pain?  No/denies    Multiple Pain Sites  No       TREATMENT Ther-ex  Octane fitness x 5 mins L2 Hooklying marching  x 10;  Hooklying bridges x 10 x 2  Hooklying SAQ over bolster with 2 lbs  2 x 10; Hookling clams with YTB 2 x 10; L sidelying RLE flex/ext x 10  R sidelying LLE flex/ext x 10  Right sidelying with left hip abd x 10 x 2  Left sidelying with right  hip abd x 10 x 2  SLR BLE x 10 x 2 Prone knee flex/ hip extension x 10 x 2  Patient reports no pain but does have pain behaviors during  Exercises  Patient required min-moderate verbal/tactile cues for correct exercise technique.    Pt educated throughout session about proper posture and technique with exercises. Improved  exercise technique, movement at target joints, use of target muscles after min to mod verbal, visual, tactile cues.                         PT Education - 07/14/18 1155    Education Details  hep    Person(s) Educated  Patient    Methods  Explanation    Comprehension  Returned demonstration;Need further instruction       PT Short Term Goals - 05/19/18 1248      PT SHORT TERM GOAL #1   Title  After 4 weeks patient will demonstrate 5xSTS hands free from chair +airex in less than 11 seconds.     Status  New        PT Long Term Goals - 05/19/18 1246      PT LONG TERM GOAL #1   Title  After 8 weeks pt will demonstrate 5xSTS from chair hands free in <13 seconds.     Status  New      PT LONG TERM GOAL #2   Title  After 8 weeks patient will  demonstrate SLS >10sec bilat to improve safety at home.     Status  New      PT LONG TERM GOAL #3   Title  After 8 weeks patient will demonstrate hip extension strength 3/5 left glutemax to improve energy conservation in gait.     Status  New      PT LONG TERM GOAL #4   Title  After 8 weeks pt will demonstrate performance of 6MWT averaging gait speed >1.74m/s.     Status  New            Plan - 07/14/18 1157    Clinical Impression Statement  Pt responded well to all interventions today.  Focus on LE strengthening in order to improve balance and ambulation.  Pt was able to complete LE exercises, requiring cueing for proper form.  Pt would continue to benefit from skilled therapy services in order to improve LE and core strength, gait and functional mobility in order to decrease risk of falls and improve independence with mobility    Rehab Potential  Fair    PT Frequency  2x / week    PT Duration  8 weeks    PT Treatment/Interventions  ADLs/Self Care Home Management;Electrical Stimulation;DME Instruction;Gait training;Therapeutic exercise;Therapeutic activities;Functional mobility training;Balance training;Neuromuscular re-education;Patient/family education    PT Next Visit Plan  Continue with isolated strengthening     PT Home Exercise Plan  No updates this session     Consulted and Agree with Plan of Care  Patient       Patient will benefit from skilled therapeutic intervention in order to improve the following deficits and impairments:  Abnormal gait, Decreased balance, Decreased endurance, Decreased mobility, Difficulty walking, Decreased strength, Postural dysfunction, Improper body mechanics  Visit Diagnosis: Hemiplegia and hemiparesis following cerebral infarction affecting right non-dominant side (HCC)  Muscle weakness (generalized)  Other abnormalities of gait and mobility     Problem List Patient Active Problem List   Diagnosis Date Noted  . CVA (cerebral vascular  accident) ([place]) 03/17/2018  . UTI (urinary tract infection) 02/27/2018  . Hypoglycemia 01/15/2018  . Type 2 diabetes mellitus without complication, with long-term current use of insulin ([place]) 08/27/2017  . Protein-calorie malnutrition, severe 08/19/2017  . Pancreatitis, acute 08/16/2017  . DKA (diabetic ketoacidoses) ([place]) 08/16/2017  . HTN (hypertension) 08/16/2017  . HLD (hyperlipidemia) 08/16/2017  .  Carotid stenosis 08/02/2016  . GERD (gastroesophageal reflux disease) 08/02/2016  . History of esophagogastroduodenoscopy (EGD) 07/01/2016  . History of recent blood transfusion 07/01/2016  . Acute renal failure superimposed on stage 3 chronic kidney disease ([place]) 07/01/2016  . Hyponatremia 07/01/2016  . Uncontrolled diabetes mellitus ([place]) 07/01/2016  . Alcohol abuse 07/01/2016  . Monilial esophagitis ([place]) 07/01/2016  . Tobacco abuse 07/01/2016  . Confusion 06/29/2016  . Iron deficiency anemia 06/29/2016  . Coagulopathy ([place]) 06/29/2016    Alanson Puls, PT DPT 07/14/2018, 12:17 PM  [place] MAIN [facility] SERVICES [address] [place], [place], [address] Phone: [phone]   Fax:  [phone]  Name: [name] MRN: [id number] Date of Birth: 05/04/1961

## 2018-07-16 ENCOUNTER — Ambulatory Visit: Payer: Medicare HMO | Admitting: Physical Therapy

## 2018-07-25 ENCOUNTER — Other Ambulatory Visit: Payer: Self-pay

## 2018-07-25 ENCOUNTER — Inpatient Hospital Stay
Admission: EM | Admit: 2018-07-25 | Discharge: 2018-07-27 | DRG: 682 | Disposition: A | Discharge status: Home / Self Care | Payer: Medicare HMO | Attending: Internal Medicine | Admitting: Internal Medicine

## 2018-07-25 ENCOUNTER — Encounter: Payer: Self-pay | Admitting: Emergency Medicine

## 2018-07-25 DIAGNOSIS — E860 Dehydration: Secondary | ICD-10-CM | POA: Diagnosis present

## 2018-07-25 DIAGNOSIS — N179 Acute kidney failure, unspecified: Secondary | ICD-10-CM | POA: Diagnosis not present

## 2018-07-25 DIAGNOSIS — Z8249 Family history of ischemic heart disease and other diseases of the circulatory system: Secondary | ICD-10-CM | POA: Diagnosis not present

## 2018-07-25 DIAGNOSIS — F329 Major depressive disorder, single episode, unspecified: Secondary | ICD-10-CM | POA: Diagnosis not present

## 2018-07-25 DIAGNOSIS — Z79899 Other long term (current) drug therapy: Secondary | ICD-10-CM | POA: Diagnosis not present

## 2018-07-25 DIAGNOSIS — E785 Hyperlipidemia, unspecified: Secondary | ICD-10-CM | POA: Diagnosis present

## 2018-07-25 DIAGNOSIS — Z716 Tobacco abuse counseling: Secondary | ICD-10-CM | POA: Diagnosis not present

## 2018-07-25 DIAGNOSIS — Z841 Family history of disorders of kidney and ureter: Secondary | ICD-10-CM | POA: Diagnosis not present

## 2018-07-25 DIAGNOSIS — E119 Type 2 diabetes mellitus without complications: Secondary | ICD-10-CM | POA: Diagnosis not present

## 2018-07-25 DIAGNOSIS — N183 Chronic kidney disease, stage 3 (moderate): Secondary | ICD-10-CM | POA: Diagnosis not present

## 2018-07-25 DIAGNOSIS — E11649 Type 2 diabetes mellitus with hypoglycemia without coma: Secondary | ICD-10-CM | POA: Diagnosis present

## 2018-07-25 DIAGNOSIS — G9341 Metabolic encephalopathy: Secondary | ICD-10-CM | POA: Diagnosis not present

## 2018-07-25 DIAGNOSIS — E161 Other hypoglycemia: Secondary | ICD-10-CM | POA: Diagnosis not present

## 2018-07-25 DIAGNOSIS — K219 Gastro-esophageal reflux disease without esophagitis: Secondary | ICD-10-CM | POA: Diagnosis present

## 2018-07-25 DIAGNOSIS — Z888 Allergy status to other drugs, medicaments and biological substances status: Secondary | ICD-10-CM

## 2018-07-25 DIAGNOSIS — F17290 Nicotine dependence, other tobacco product, uncomplicated: Secondary | ICD-10-CM | POA: Diagnosis present

## 2018-07-25 DIAGNOSIS — R0902 Hypoxemia: Secondary | ICD-10-CM | POA: Diagnosis not present

## 2018-07-25 DIAGNOSIS — E1122 Type 2 diabetes mellitus with diabetic chronic kidney disease: Secondary | ICD-10-CM | POA: Diagnosis not present

## 2018-07-25 DIAGNOSIS — R9431 Abnormal electrocardiogram [ECG] [EKG]: Secondary | ICD-10-CM | POA: Diagnosis not present

## 2018-07-25 DIAGNOSIS — I10 Essential (primary) hypertension: Secondary | ICD-10-CM | POA: Diagnosis not present

## 2018-07-25 DIAGNOSIS — I129 Hypertensive chronic kidney disease with stage 1 through stage 4 chronic kidney disease, or unspecified chronic kidney disease: Secondary | ICD-10-CM | POA: Diagnosis present

## 2018-07-25 DIAGNOSIS — R809 Proteinuria, unspecified: Secondary | ICD-10-CM | POA: Diagnosis not present

## 2018-07-25 DIAGNOSIS — Z8673 Personal history of transient ischemic attack (TIA), and cerebral infarction without residual deficits: Secondary | ICD-10-CM | POA: Diagnosis not present

## 2018-07-25 DIAGNOSIS — Z794 Long term (current) use of insulin: Secondary | ICD-10-CM | POA: Diagnosis not present

## 2018-07-25 DIAGNOSIS — E162 Hypoglycemia, unspecified: Secondary | ICD-10-CM | POA: Diagnosis not present

## 2018-07-25 DIAGNOSIS — N189 Chronic kidney disease, unspecified: Secondary | ICD-10-CM

## 2018-07-25 DIAGNOSIS — N1330 Unspecified hydronephrosis: Secondary | ICD-10-CM

## 2018-07-25 LAB — BASIC METABOLIC PANEL
Anion gap: 5 (ref 5–15)
BUN: 31 mg/dL — ABNORMAL HIGH (ref 6–20)
CO2: 20 mmol/L — ABNORMAL LOW (ref 22–32)
Calcium: 7.1 mg/dL — ABNORMAL LOW (ref 8.9–10.3)
Chloride: 116 mmol/L — ABNORMAL HIGH (ref 98–111)
Creatinine, Ser: 2.89 mg/dL — ABNORMAL HIGH (ref 0.61–1.24)
GFR calc Af Amer: 27 mL/min — ABNORMAL LOW (ref >60)
GFR calc non Af Amer: 23 mL/min — ABNORMAL LOW (ref >60)
Glucose, Bld: 207 mg/dL — ABNORMAL HIGH (ref 70–99)
Potassium: 3.9 mmol/L (ref 3.5–5.1)
Sodium: 141 mmol/L (ref 135–145)

## 2018-07-25 LAB — URINALYSIS, COMPLETE (UACMP) WITH MICROSCOPIC
Bacteria, UA: NONE SEEN
Bilirubin Urine: NEGATIVE
Glucose, UA: 50 mg/dL — AB
Ketones, ur: NEGATIVE mg/dL
Leukocytes,Ua: NEGATIVE
Nitrite: NEGATIVE
Protein, ur: 100 mg/dL — AB
Specific Gravity, Urine: 1.012 (ref 1.005–1.030)
pH: 6 (ref 5.0–8.0)

## 2018-07-25 LAB — CBC WITH DIFFERENTIAL/PLATELET
Abs Immature Granulocytes: 0.01 10*3/uL (ref 0.00–0.07)
Basophils Absolute: 0 10*3/uL (ref 0.0–0.1)
Basophils Relative: 0 %
Eosinophils Absolute: 0 10*3/uL (ref 0.0–0.5)
Eosinophils Relative: 0 %
HCT: 41.3 % (ref 39.0–52.0)
Hemoglobin: 13.1 g/dL (ref 13.0–17.0)
Immature Granulocytes: 0 %
Lymphocytes Relative: 18 %
Lymphs Abs: 1 10*3/uL (ref 0.7–4.0)
MCH: 30 pg (ref 26.0–34.0)
MCHC: 31.7 g/dL (ref 30.0–36.0)
MCV: 94.7 fL (ref 80.0–100.0)
Monocytes Absolute: 0.3 10*3/uL (ref 0.1–1.0)
Monocytes Relative: 6 %
Neutro Abs: 4.2 10*3/uL (ref 1.7–7.7)
Neutrophils Relative %: 76 %
Platelets: 209 10*3/uL (ref 150–400)
RBC: 4.36 MIL/uL (ref 4.22–5.81)
RDW: 14.6 % (ref 11.5–15.5)
WBC: 5.6 10*3/uL (ref 4.0–10.5)
nRBC: 0 % (ref 0.0–0.2)

## 2018-07-25 LAB — GLUCOSE, CAPILLARY
Glucose-Capillary: 101 mg/dL — ABNORMAL HIGH (ref 70–99)
Glucose-Capillary: 128 mg/dL — ABNORMAL HIGH (ref 70–99)
Glucose-Capillary: 139 mg/dL — ABNORMAL HIGH (ref 70–99)
Glucose-Capillary: 36 mg/dL — CL (ref 70–99)
Glucose-Capillary: 181 mg/dL — ABNORMAL HIGH (ref 70–99)
Glucose-Capillary: 83 mg/dL (ref 70–99)
Glucose-Capillary: 173 mg/dL — ABNORMAL HIGH (ref 70–99)

## 2018-07-25 MED ORDER — METOCLOPRAMIDE HCL 10 MG PO TABS
10.0000 mg | ORAL_TABLET | Freq: Three times a day (TID) | ORAL | Status: DC
  Administered 2018-07-26 – 2018-07-27 (×4): 10 mg via ORAL
  Filled 2018-07-25 (×4): qty 1

## 2018-07-25 MED ORDER — ACETAMINOPHEN 325 MG PO TABS: 650.0000 mg | ORAL_TABLET | Freq: Four times a day (QID) | ORAL | Status: DC | PRN

## 2018-07-25 MED ORDER — HEPARIN SODIUM (PORCINE) 5000 UNIT/ML IJ SOLN
5000.0000 [IU] | Freq: Three times a day (TID) | INTRAMUSCULAR | Status: DC
  Administered 2018-07-25 – 2018-07-27 (×5): 5000 [IU] via SUBCUTANEOUS
  Filled 2018-07-25 (×5): qty 1

## 2018-07-25 MED ORDER — HYDRALAZINE HCL 20 MG/ML IJ SOLN
10.0000 mg | Freq: Once | INTRAMUSCULAR | Status: AC
  Administered 2018-07-25: 10 mg via INTRAVENOUS

## 2018-07-25 MED ORDER — LABETALOL HCL 5 MG/ML IV SOLN
10.0000 mg | Freq: Once | INTRAVENOUS | Status: AC
  Administered 2018-07-25: 10 mg via INTRAVENOUS
  Filled 2018-07-25: qty 4

## 2018-07-25 MED ORDER — ACETAMINOPHEN 650 MG RE SUPP: 650.0000 mg | Freq: Four times a day (QID) | RECTAL | Status: DC | PRN

## 2018-07-25 MED ORDER — CLONIDINE HCL 0.1 MG PO TABS: 0.1000 mg | ORAL_TABLET | Freq: Two times a day (BID) | ORAL | Status: DC

## 2018-07-25 MED ORDER — INSULIN ASPART 100 UNIT/ML ~~LOC~~ SOLN: 0.0000 [IU] | Freq: Every day | SUBCUTANEOUS | Status: DC

## 2018-07-25 MED ORDER — CLONIDINE HCL 0.1 MG PO TABS
0.1000 mg | ORAL_TABLET | Freq: Two times a day (BID) | ORAL | Status: DC
  Administered 2018-07-25 – 2018-07-27 (×4): 0.1 mg via ORAL
  Filled 2018-07-25 (×4): qty 1

## 2018-07-25 MED ORDER — ONDANSETRON HCL 4 MG/2ML IJ SOLN: 4.0000 mg | Freq: Four times a day (QID) | INTRAMUSCULAR | Status: DC | PRN

## 2018-07-25 MED ORDER — DEXTROSE 50 % IV SOLN
1.0000 | Freq: Once | INTRAVENOUS | Status: AC
  Administered 2018-07-25: 50 mL via INTRAVENOUS

## 2018-07-25 MED ORDER — INSULIN ASPART 100 UNIT/ML ~~LOC~~ SOLN
0.0000 [IU] | Freq: Three times a day (TID) | SUBCUTANEOUS | Status: DC
  Administered 2018-07-26: 1 [IU] via SUBCUTANEOUS
  Administered 2018-07-26: 2 [IU] via SUBCUTANEOUS
  Administered 2018-07-27: 3 [IU] via SUBCUTANEOUS
  Filled 2018-07-25 (×3): qty 1

## 2018-07-25 MED ORDER — SODIUM CHLORIDE 0.9 % IV SOLN
INTRAVENOUS | Status: DC
  Administered 2018-07-25 – 2018-07-27 (×3): via INTRAVENOUS

## 2018-07-25 MED ORDER — HYDRALAZINE HCL 20 MG/ML IJ SOLN
10.0000 mg | Freq: Four times a day (QID) | INTRAMUSCULAR | Status: DC | PRN
  Administered 2018-07-25 – 2018-07-27 (×3): 10 mg via INTRAVENOUS
  Filled 2018-07-25 (×4): qty 1

## 2018-07-25 MED ORDER — SERTRALINE HCL 50 MG PO TABS
50.0000 mg | ORAL_TABLET | Freq: Every day | ORAL | Status: DC
  Administered 2018-07-26 – 2018-07-27 (×2): 50 mg via ORAL
  Filled 2018-07-25 (×2): qty 1

## 2018-07-25 MED ORDER — SODIUM CHLORIDE 0.9 % IV BOLUS
500.0000 mL | Freq: Once | INTRAVENOUS | Status: AC
  Administered 2018-07-25: 500 mL via INTRAVENOUS

## 2018-07-25 MED ORDER — PRAVASTATIN SODIUM 20 MG PO TABS
20.0000 mg | ORAL_TABLET | Freq: Every evening | ORAL | Status: DC
  Administered 2018-07-25 – 2018-07-26 (×2): 20 mg via ORAL
  Filled 2018-07-25 (×2): qty 1

## 2018-07-25 MED ORDER — DEXTROSE 50 % IV SOLN
INTRAVENOUS | Status: AC
  Administered 2018-07-25: 50 mL via INTRAVENOUS
  Filled 2018-07-25: qty 50

## 2018-07-25 MED ORDER — ONDANSETRON HCL 4 MG PO TABS: 4.0000 mg | ORAL_TABLET | Freq: Four times a day (QID) | ORAL | Status: DC | PRN

## 2018-07-25 MED ORDER — LISINOPRIL 10 MG PO TABS: 10.0000 mg | ORAL_TABLET | Freq: Every day | ORAL | Status: DC

## 2018-07-25 NOTE — ED Notes (Signed)
Pt requesting to go home - he states that he is fine and "tired of being here" - he states "my blood sugar is up now and I am ready to go" - Dr Burlene Arnt in room and is aware - pt has eaten the other 3/4 of [place] sandwich anf drank cup of orange juice

## 2018-07-25 NOTE — ED Notes (Signed)
Pt provided with Ginger Ale per RN approval

## 2018-07-25 NOTE — ED Notes (Signed)
Admitting provider in to see pt - noted elevated BP and gave VO for labetalol so that floor would accept pt

## 2018-07-25 NOTE — ED Triage Notes (Signed)
Pt to ED via EMS from home was found unresponsive by family, per EMS glucose read low upon arrival, pt was given dextrose in route. PT is A&Ox4 upon arrival, cbg 28. MD aware , D50 given .

## 2018-07-25 NOTE — ED Notes (Signed)
..  This tech in pt room at this time to check on pt, pt with no concerns at this time, pt with bed lowered, call light in reach, pt offered a warm blanket, pt not needing to use the restroom or be changed at this time, pt reminded that if a need arises the call light is within reach and to call for help. Pt asking for something to drink, this tech advised pt that I would ask the nursing staff and let him know.This tech will continue to monitor pt from monitors at nursing station and will continue to check in with pt hourly

## 2018-07-25 NOTE — ED Provider Notes (Addendum)
[facility] Emergency Department Provider Note  ____________________________________________   I have reviewed the triage vital signs and the nursing notes. Where available I have reviewed prior notes and, if possible and indicated, outside hospital notes.    HISTORY  Chief Complaint Hypoglycemia    HPI [name] is a 58 y.o. male who presents today complaining of hypoglycemia.  Patient states he did not eat breakfast after taking his medications for diabetes including his insulin, instead he took a nap, and when he woke up he felt weak so EMS was called they found him with a low blood sugar they gave him sugar and his symptoms resolved he has no complaints at this time.  Patient does have a chronic history of recurrent hypoglycemia, he also has recurrent strokes with a right-sided weakness he states that there is no change in his status in any other way he felt totally normal before he went to bed without taking his food. Did not take a nap because he felt unwell he just took a nap because he felt like it.  Past Medical History:  Diagnosis Date  . Chronic kidney disease   . Diabetes mellitus without complication ([place])   . ETOH abuse   . Hyperlipidemia   . Hypertension   . Stroke [facility])     Patient Active Problem List   Diagnosis Date Noted  . CVA (cerebral vascular accident) ([place]) 03/17/2018  . UTI (urinary tract infection) 02/27/2018  . Hypoglycemia 01/15/2018  . Type 2 diabetes mellitus without complication, with long-term current use of insulin ([place]) 08/27/2017  . Protein-calorie malnutrition, severe 08/19/2017  . Pancreatitis, acute 08/16/2017  . DKA (diabetic ketoacidoses) ([place]) 08/16/2017  . HTN (hypertension) 08/16/2017  . HLD (hyperlipidemia) 08/16/2017  . Carotid stenosis 08/02/2016  . GERD (gastroesophageal reflux disease) 08/02/2016  . History of esophagogastroduodenoscopy (EGD) 07/01/2016  . History of recent blood  transfusion 07/01/2016  . Acute renal failure superimposed on stage 3 chronic kidney disease ([place]) 07/01/2016  . Hyponatremia 07/01/2016  . Uncontrolled diabetes mellitus ([place]) 07/01/2016  . Alcohol abuse 07/01/2016  . Monilial esophagitis ([place]) 07/01/2016  . Tobacco abuse 07/01/2016  . Confusion 06/29/2016  . Iron deficiency anemia 06/29/2016  . Coagulopathy ([place]) 06/29/2016    Past Surgical History:  Procedure Laterality Date  . ESOPHAGOGASTRODUODENOSCOPY (EGD) WITH PROPOFOL N/A 07/01/2016   Procedure: ESOPHAGOGASTRODUODENOSCOPY (EGD) WITH PROPOFOL;  Surgeon: San Jetty, MD;  Location: [facility] ENDOSCOPY;  Service: General;  Laterality: N/A;    Prior to Admission medications   Medication Sig Start Date End Date Taking? Authorizing Provider  gabapentin (NEURONTIN) 300 MG capsule Take 1 capsule (300 mg total) by mouth 3 (three) times daily. 03/04/18  Yes Dustin Flock, MD  glimepiride (AMARYL) 4 MG tablet Take 4 mg by mouth daily. 03/25/18  Yes [provider]  insulin glargine (LANTUS) 100 UNIT/ML injection Inject 0.15 mLs (15 Units total) into the skin 2 (two) times daily. 03/04/18  Yes Dustin Flock, MD  lisinopril (PRINIVIL,ZESTRIL) 10 MG tablet Take 10 mg by mouth daily. 12/19/17  Yes [provider]  metoCLOPramide (REGLAN) 10 MG tablet Take 1 tablet (10 mg total) by mouth 3 (three) times daily before meals. 03/04/18  Yes Dustin Flock, MD  pravastatin (PRAVACHOL) 20 MG tablet Take 20 mg by mouth Nightly. 04/29/18  Yes [provider]  sertraline (ZOLOFT) 50 MG tablet Take 50 mg by mouth daily.  12/19/17  Yes [provider]    Allergies Ferrous gluconate  Family History  Problem Relation Age of Onset  . CAD Brother   . Dementia Mother   . Renal Disease Father     Social History Social History   Tobacco Use  . Smoking status: Current Every Day Smoker    Packs/day: 0.25    Years: 5.00    Pack years: 1.25    Types: Cigars  .  Smokeless tobacco: Never Used  Substance Use Topics  . Alcohol use: Yes    Alcohol/week: 10.0 standard drinks    Types: 8 Cans of beer, 2 Shots of liquor per week  . Drug use: No    Review of Systems Constitutional: No fever/chills Eyes: No visual changes. ENT: No sore throat. No stiff neck no neck pain Cardiovascular: Denies chest pain. Respiratory: Denies shortness of breath. Gastrointestinal:   no vomiting.  No diarrhea.  No constipation. Genitourinary: Negative for dysuria. Musculoskeletal: Negative lower extremity swelling Skin: Negative for rash. Neurological: Negative for severe headaches, focal weakness or numbness.   ____________________________________________   PHYSICAL EXAM:  VITAL SIGNS: ED Triage Vitals  Enc Vitals Group     BP 07/25/18 1332 (!) 210/110     Pulse Rate 07/25/18 1332 63     Resp 07/25/18 1332 16     Temp --      Temp src --      SpO2 07/25/18 1332 100 %     Weight --      Height --      Head Circumference --      Peak Flow --      Pain Score 07/25/18 1341 0     Pain Loc --      Pain Edu? --      Excl. in [place]? --     Constitutional: Alert and oriented. Well appearing and in no acute distress. Eyes: Conjunctivae are normal Head: Atraumatic HEENT: No congestion/rhinnorhea. Mucous membranes are moist.  Oropharynx non-erythematous Neck:   Nontender with no meningismus, no masses, no stridor Cardiovascular: Normal rate, regular rhythm. Grossly normal heart sounds.  Good peripheral circulation. Respiratory: Normal respiratory effort.  No retractions. Lungs CTAB. Abdominal: Soft and nontender. No distention. No guarding no rebound Back:  There is no focal tenderness or step off.  there is no midline tenderness there are no lesions noted. there is no CVA tenderness Musculoskeletal: No lower extremity tenderness, no upper extremity tenderness. No joint effusions, no DVT signs strong distal pulses no edema Neurologic: There is some hesitancy to  his speech which he states is his baseline he has right upper and lower extremity mild weakness Skin:  Skin is warm, dry and intact. No rash noted. Psychiatric: Mood and affect are normal. Speech and behavior are normal.  ____________________________________________   LABS (all labs ordered are listed, but only abnormal results are displayed)  Labs Reviewed  GLUCOSE, CAPILLARY - Abnormal; Notable for the following components:      Result Value   Glucose-Capillary 36 (*)    All other components within normal limits  GLUCOSE, CAPILLARY - Abnormal; Notable for the following components:   Glucose-Capillary 101 (*)    All other components within normal limits  GLUCOSE, CAPILLARY - Abnormal; Notable for the following components:   Glucose-Capillary 128 (*)    All other components within normal limits  GLUCOSE, CAPILLARY - Abnormal; Notable for the following components:   Glucose-Capillary 139 (*)    All other components within normal limits  CBC WITH DIFFERENTIAL/PLATELET  URINALYSIS, COMPLETE (UACMP) WITH MICROSCOPIC  BASIC METABOLIC PANEL    Pertinent labs  results that were available during my care of the patient were reviewed by me and considered in my medical decision making (see chart for details). ____________________________________________  EKG  I personally interpreted any EKGs ordered by me or triage Sinus rhythm rate 64, no acute ST elevation, repolarization abnormality noted. ____________________________________________  RADIOLOGY  Pertinent labs & imaging results that were available during my care of the patient were reviewed by me and considered in my medical decision making (see chart for details). If possible, patient and/or family made aware of any abnormal findings.  No results found. ____________________________________________    PROCEDURES  Procedure(s) performed: None  Procedures  Critical Care performed: CRITICAL CARE Performed by: Schuyler Amor   Total critical care time: 45 minutes  Critical care time was exclusive of separately billable procedures and treating other patients.  Critical care was necessary to treat or prevent imminent or life-threatening deterioration.  Critical care was time spent personally by me on the following activities: development of treatment plan with patient and/or surrogate as well as nursing, discussions with consultants, evaluation of patient's response to treatment, examination of patient, obtaining history from patient or surrogate, ordering and performing treatments and interventions, ordering and review of laboratory studies, ordering and review of radiographic studies, pulse oximetry and re-evaluation of patient's condition.   ____________________________________________   INITIAL IMPRESSION / ASSESSMENT AND PLAN / ED COURSE  Pertinent labs & imaging results that were available during my care of the patient were reviewed by me and considered in my medical decision making (see chart for details).  Patient with hyperglycemia we given her sugar and her sugars normalized, blood pressure somewhat elevated but this is not unusual for him, he states he did not take his blood pressure medications yet today.  No indication that the patient has had an acute decompensation in any other respect.  He has baseline weakness which he states is normal he is been able to eat and drink and he looks okay here.  We will check baseline blood work and urinalysis as a precaution and if all looks well and he continues euglycemic we will hopefully get him home.  ----------------------------------------- 3:22 PM on 07/25/2018 -----------------------------------------  Pt with aki over baseline has not been drinking or eating very much recently he states.  We will admit him for IV hydration this exact scenario has been repeated in the past.    ____________________________________________   FINAL CLINICAL  IMPRESSION(S) / ED DIAGNOSES  Final diagnoses:  None      This chart was dictated using voice recognition software.  Despite best efforts to proofread,  errors can occur which can change meaning.      Schuyler Amor, MD 07/25/18 1433    Schuyler Amor, MD 07/25/18 1516    Schuyler Amor, MD 07/25/18 4008    Schuyler Amor, MD 07/25/18 1531

## 2018-07-25 NOTE — Discharge Instructions (Signed)
Make sure that you check your sugar regularly and eat if you take insulin, return to the emergency room for any new or worrisome symptoms.  Please stay with family tonight.

## 2018-07-25 NOTE — ED Notes (Signed)
Lab called and said that green top was hemolysed. Will redraw.

## 2018-07-25 NOTE — ED Notes (Signed)
Pt drank one container of orange juice and ate 1/4 of a [place] sandwich

## 2018-07-25 NOTE — ED Notes (Signed)
Report given to Misty RN 

## 2018-07-25 NOTE — ED Notes (Signed)
ED TO INPATIENT HANDOFF REPORT  ED Nurse Name and Phone #:  Laren Boom, RN  S Name/Age/Gender [name] 58 y.o. male Room/Bed: ED06A/ED06A  Code Status   Code Status: Prior  Home/SNF/Other Home Patient oriented to: self, place, time and situation Is this baseline? Yes   Triage Complete: Triage complete  Chief Complaint diabetic  Triage Note Pt to ED via EMS from home was found unresponsive by family, per EMS glucose read low upon arrival, pt was given dextrose in route. PT is A&Ox4 upon arrival, cbg 26. MD aware , D50 given .    Allergies Allergies  Allergen Reactions  . Ferrous Gluconate Nausea And Vomiting    Level of Care/Admitting Diagnosis ED Disposition    ED Disposition Condition [facility]: [place] [[id number]]  Level of Care: Med-Surg [16]  Diagnosis: Acute on chronic renal failure [facility]) [[id number]]  Admitting Physician: Henreitta Leber [[id number]]  Attending Physician: Henreitta Leber [[id number]]  PT Class (Do Not Modify): Observation [104]  PT Acc Code (Do Not Modify): Observation [10022]       B Medical/Surgery History Past Medical History:  Diagnosis Date  . Chronic kidney disease   . Diabetes mellitus without complication ([place])   . ETOH abuse   . Hyperlipidemia   . Hypertension   . Stroke [facility])    Past Surgical History:  Procedure Laterality Date  . ESOPHAGOGASTRODUODENOSCOPY (EGD) WITH PROPOFOL N/A 07/01/2016   Procedure: ESOPHAGOGASTRODUODENOSCOPY (EGD) WITH PROPOFOL;  Surgeon: San Jetty, MD;  Location: [facility] ENDOSCOPY;  Service: General;  Laterality: N/A;     A IV Location/Drains/Wounds Patient Lines/Drains/Airways Status   Active Line/Drains/Airways    Name:   Placement date:   Placement time:   Site:   Days:   Peripheral IV 07/25/18 Left Forearm   07/25/18    1343    Forearm   less than 1   Peripheral IV 07/25/18 Right Antecubital   07/25/18    1343    Antecubital   less than 1           Intake/Output Last 24 hours No intake or output data in the 24 hours ending 07/25/18 1554  Labs/Imaging Results for orders placed or performed during the hospital encounter of 07/25/18 (from the past 48 hour(s))  Glucose, capillary     Status: Abnormal   Collection Time: 07/25/18  1:34 PM  Result Value Ref Range   Glucose-Capillary 36 (LL) 70 - 99 mg/dL  Glucose, capillary     Status: Abnormal   Collection Time: 07/25/18  1:40 PM  Result Value Ref Range   Glucose-Capillary 101 (H) 70 - 99 mg/dL  Glucose, capillary     Status: Abnormal   Collection Time: 07/25/18  1:53 PM  Result Value Ref Range   Glucose-Capillary 128 (H) 70 - 99 mg/dL  CBC with Differential     Status: None   Collection Time: 07/25/18  1:56 PM  Result Value Ref Range   WBC 5.6 4.0 - 10.5 K/uL   RBC 4.36 4.22 - 5.81 MIL/uL   Hemoglobin 13.1 13.0 - 17.0 g/dL   HCT 41.3 39.0 - 52.0 %   MCV 94.7 80.0 - 100.0 fL   MCH 30.0 26.0 - 34.0 pg   MCHC 31.7 30.0 - 36.0 g/dL   RDW 14.6 11.5 - 15.5 %   Platelets 209 150 - 400 K/uL   nRBC 0.0 0.0 - 0.2 %   Neutrophils Relative % 76 %  Neutro Abs 4.2 1.7 - 7.7 K/uL   Lymphocytes Relative 18 %   Lymphs Abs 1.0 0.7 - 4.0 K/uL   Monocytes Relative 6 %   Monocytes Absolute 0.3 0.1 - 1.0 K/uL   Eosinophils Relative 0 %   Eosinophils Absolute 0.0 0.0 - 0.5 K/uL   Basophils Relative 0 %   Basophils Absolute 0.0 0.0 - 0.1 K/uL   Immature Granulocytes 0 %   Abs Immature Granulocytes 0.01 0.00 - 0.07 K/uL    Comment: Performed at [facility], [address] [place], [place] [address]  Glucose, capillary     Status: Abnormal   Collection Time: 07/25/18  2:13 PM  Result Value Ref Range   Glucose-Capillary 139 (H) 70 - 99 mg/dL  Urinalysis, Complete w Microscopic     Status: Abnormal   Collection Time: 07/25/18  2:16 PM  Result Value Ref Range   Color, Urine STRAW (A) YELLOW   APPearance CLEAR (A) CLEAR   Specific Gravity, Urine 1.012 1.005 - 1.030   pH 6.0  5.0 - 8.0   Glucose, UA 50 (A) NEGATIVE mg/dL   Hgb urine dipstick SMALL (A) NEGATIVE   Bilirubin Urine NEGATIVE NEGATIVE   Ketones, ur NEGATIVE NEGATIVE mg/dL   Protein, ur 100 (A) NEGATIVE mg/dL   Nitrite NEGATIVE NEGATIVE   Leukocytes,Ua NEGATIVE NEGATIVE   RBC / HPF 6-10 0 - 5 RBC/hpf   WBC, UA 6-10 0 - 5 WBC/hpf   Bacteria, UA NONE SEEN NONE SEEN   Squamous Epithelial / LPF 0-5 0 - 5   Mucus PRESENT    Hyaline Casts, UA PRESENT     Comment: Performed at [facility], [address] [place], [place] [address]  Glucose, capillary     Status: Abnormal   Collection Time: 07/25/18  3:00 PM  Result Value Ref Range   Glucose-Capillary 181 (H) 70 - 99 mg/dL  Basic metabolic panel     Status: Abnormal   Collection Time: 07/25/18  3:04 PM  Result Value Ref Range   Sodium 141 135 - 145 mmol/L   Potassium 3.9 3.5 - 5.1 mmol/L   Chloride 116 (H) 98 - 111 mmol/L   CO2 20 (L) 22 - 32 mmol/L   Glucose, Bld 207 (H) 70 - 99 mg/dL   BUN 31 (H) 6 - 20 mg/dL   Creatinine, Ser 2.89 (H) 0.61 - 1.24 mg/dL   Calcium 7.1 (L) 8.9 - 10.3 mg/dL   GFR calc non Af Amer 23 (L) >60 mL/min   GFR calc Af Amer 27 (L) >60 mL/min   Anion gap 5 5 - 15    Comment: Performed at [facility], [address] [place], [place] [address]   No results found.  Pending Labs [employer] (From admission, onward)    Start     Ordered   Signed and Occupational hygienist morning,   R     Signed and Held   Signed and Held  CBC  (heparin)  Once,   R    Comments:  Baseline for heparin therapy IF NOT ALREADY DRAWN.  Notify MD if PLT < 100 K.    Signed and Held   Signed and Held  Creatinine, serum  (heparin)  Once,   R    Comments:  Baseline for heparin therapy IF NOT ALREADY DRAWN.    Signed and Held          Vitals/Pain Today's Vitals  07/25/18 1334 07/25/18 1340 07/25/18 1341 07/25/18 1400  BP: (!) 213/96 (!) 213/96  (!) 209/108  Pulse: 63 74  65  Resp: 16 16  (!)  24  SpO2: 100% 98%  98%  PainSc:   0-No pain     Isolation Precautions No active isolations  Medications Medications  sodium chloride 0.9 % bolus 500 mL (has no administration in time range)  labetalol (NORMODYNE,TRANDATE) injection 10 mg (has no administration in time range)  hydrALAZINE (APRESOLINE) injection 10 mg (has no administration in time range)  dextrose 50 % solution 50 mL (50 mLs Intravenous Given 07/25/18 1340)    Mobility walks Low fall risk   Focused Assessments Cardiac Assessment Handoff:    Lab Results  Component Value Date   TROPONINI <0.03 08/16/2017   No results found for: DDIMER Does the Patient currently have chest pain? No     R Recommendations: See Admitting Provider Note  Report given to:   Additional Notes:  Pt was found unresponsive- is back to baseline

## 2018-07-25 NOTE — ED Notes (Addendum)
Pt is oriented to person and place but not to time -  pt slow to respond - noted tremor when attempting to hold items - pt has hx of DM but states that he did not take his diabetic medication today - he reports that he has not eaten all day - per EMS pt was found on arrival to be unresponsive Pt denies any s/sx of illness

## 2018-07-25 NOTE — ED Notes (Signed)
Assisted pt with toileting.  

## 2018-07-25 NOTE — ED Notes (Signed)
Messaged admitting provider about elevated BP- see new orders

## 2018-07-25 NOTE — H&P (Signed)
[place] [facility] [place] NAME: [name]    MR#:  [id number]  DATE OF BIRTH:  Apr 09, 1961  DATE OF ADMISSION:  07/25/2018  PRIMARY CARE PHYSICIAN: [name], MD   REQUESTING/REFERRING PHYSICIAN: Dr. Charlotte Crumb  CHIEF COMPLAINT:   Chief Complaint  Patient presents with  . Hypoglycemia    HISTORY OF PRESENT ILLNESS:  [name]  is a 58 y.o. male with a known history of chronic kidney disease stage III, diabetes, hypertension, hyperlipidemia, history of previous CVA, history of alcohol abuse who presents to the hospital due to altered mental status and noted to be hypoglycemic.  Patient says he does not remember the events but he was found unresponsive at home and has wife called EMS and he was brought to the hospital.  Patient was noted to have blood sugars in the 30s and he was given dextrose and his blood sugar seems to have improved and is better now.  He was noted to be in acute on chronic kidney injury as his baseline creatinines around 1.4-1.5 and currently elevated 2.4.  Hospitalist services were contacted for admission.  Patient says his p.o. intake has been fair and he has been drinking plenty of fluids.  He denies any nausea vomiting abdominal pain chest pain shortness of breath cough fever chills or any other associated symptoms presently.  PAST MEDICAL HISTORY:   Past Medical History:  Diagnosis Date  . Chronic kidney disease   . Diabetes mellitus without complication ([place])   . ETOH abuse   . Hyperlipidemia   . Hypertension   . Stroke [facility])     PAST SURGICAL HISTORY:   Past Surgical History:  Procedure Laterality Date  . ESOPHAGOGASTRODUODENOSCOPY (EGD) WITH PROPOFOL N/A 07/01/2016   Procedure: ESOPHAGOGASTRODUODENOSCOPY (EGD) WITH PROPOFOL;  Surgeon: San Jetty, MD;  Location: [facility] ENDOSCOPY;  Service: General;  Laterality: N/A;    SOCIAL HISTORY:   Social History   Tobacco Use  . Smoking status: Current  Every Day Smoker    Packs/day: 0.25    Years: 5.00    Pack years: 1.25    Types: Cigars  . Smokeless tobacco: Never Used  Substance Use Topics  . Alcohol use: Yes    Alcohol/week: 10.0 standard drinks    Types: 8 Cans of beer, 2 Shots of liquor per week    FAMILY HISTORY:   Family History  Problem Relation Age of Onset  . CAD Brother   . Dementia Mother   . Renal Disease Father     DRUG ALLERGIES:   Allergies  Allergen Reactions  . Ferrous Gluconate Nausea And Vomiting    REVIEW OF SYSTEMS:   Review of Systems  Constitutional: Negative for fever and weight loss.  HENT: Negative for congestion, nosebleeds and tinnitus.   Eyes: Negative for blurred vision, double vision and redness.  Respiratory: Negative for cough, hemoptysis and shortness of breath.   Cardiovascular: Negative for chest pain, orthopnea, leg swelling and PND.  Gastrointestinal: Negative for abdominal pain, diarrhea, melena, nausea and vomiting.  Genitourinary: Negative for dysuria, hematuria and urgency.  Musculoskeletal: Negative for falls and joint pain.  Neurological: Positive for weakness (generalized. ). Negative for dizziness, tingling, sensory change, focal weakness, seizures and headaches.  Endo/Heme/Allergies: Negative for polydipsia. Does not bruise/bleed easily.  Psychiatric/Behavioral: Negative for depression and memory loss. The patient is not nervous/anxious.     MEDICATIONS AT HOME:   Prior to Admission medications   Medication Sig Start Date  End Date Taking? Authorizing Provider  gabapentin (NEURONTIN) 300 MG capsule Take 1 capsule (300 mg total) by mouth 3 (three) times daily. 03/04/18  Yes Dustin Flock, MD  glimepiride (AMARYL) 4 MG tablet Take 4 mg by mouth daily. 03/25/18  Yes [provider]  insulin glargine (LANTUS) 100 UNIT/ML injection Inject 0.15 mLs (15 Units total) into the skin 2 (two) times daily. 03/04/18  Yes Dustin Flock, MD  lisinopril  (PRINIVIL,ZESTRIL) 10 MG tablet Take 10 mg by mouth daily. 12/19/17  Yes [provider]  metoCLOPramide (REGLAN) 10 MG tablet Take 1 tablet (10 mg total) by mouth 3 (three) times daily before meals. 03/04/18  Yes Dustin Flock, MD  pravastatin (PRAVACHOL) 20 MG tablet Take 20 mg by mouth Nightly. 04/29/18  Yes [provider]  sertraline (ZOLOFT) 50 MG tablet Take 50 mg by mouth daily.  12/19/17  Yes [provider]      VITAL SIGNS:  Blood pressure (!) 209/108, pulse 65, resp. rate (!) 24, SpO2 98 %.  PHYSICAL EXAMINATION:  Physical Exam  GENERAL:  58 y.o.-year-old patient lying in the bed in no acute distress.  EYES: Pupils equal, round, reactive to light and accommodation. No scleral icterus. Extraocular muscles intact.  HEENT: Head atraumatic, normocephalic. Oropharynx and nasopharynx clear. No oropharyngeal erythema, moist oral mucosa  NECK:  Supple, no jugular venous distention. No thyroid enlargement, no tenderness.  LUNGS: Normal breath sounds bilaterally, no wheezing, rales, rhonchi. No use of accessory muscles of respiration.  CARDIOVASCULAR: S1, S2 RRR. No murmurs, rubs, gallops, clicks.  ABDOMEN: Soft, nontender, nondistended. Bowel sounds present. No organomegaly or mass.  EXTREMITIES: No pedal edema, cyanosis, or clubbing. + 2 pedal & radial pulses b/l.   NEUROLOGIC: Cranial nerves II through XII are intact. No focal Motor or sensory deficits appreciated b/l. Globally weak PSYCHIATRIC: The patient is alert and oriented x 3.   SKIN: No obvious rash, lesion, or ulcer.   LABORATORY PANEL:   CBC Recent Labs  Lab 07/25/18 1356  WBC 5.6  HGB 13.1  HCT 41.3  PLT 209   ------------------------------------------------------------------------------------------------------------------  Chemistries  Recent Labs  Lab 07/25/18 1504  NA 141  K 3.9  CL 116*  CO2 20*  GLUCOSE 207*  BUN 31*  CREATININE 2.89*  CALCIUM 7.1*    ------------------------------------------------------------------------------------------------------------------  Cardiac Enzymes No results for input(s): TROPONINI in the last 168 hours. ------------------------------------------------------------------------------------------------------------------  RADIOLOGY:  No results found.   IMPRESSION AND PLAN:   58 year old male with past medical history of CKD stage III, diabetes, hypertension, hyperlipidemia, history of previous CVA, alcohol abuse who presents to the hospital due to altered mental status/unresponsiveness and noted to be hypoglycemic and also in acute on chronic kidney injury.  1.  Acute on chronic renal failure-secondary to dehydration/poor p.o. intake. - We will gently hydrate the patient with IV fluids, follow BUN and creatinine urine output.  Renal dose meds, avoid nephrotoxins.  2.  Hypoglycemia-source of patient's altered mental status and unresponsiveness.  Mental status much improved and back to baseline.  Unclear what the source of his hypoglycemia is.  Patient's diabetic regimen has not been increased recently. -I will hold patient's Amaryl and Lantus for now.  Place him on sliding scale insulin coverage.  Follow blood sugars closely.  3.  Accelerated hypertension-patient systolic blood pressures presently in the ER above 200.  I will give the patient IV labetalol for now.  Resume his lisinopril.  Will place him on some as needed IV hydralazine. -Follow blood  pressure.  4.  Hyperlipidemia-continue Pravachol.  5.  History of diabetic gastroparesis- continue Reglan.  6.  Diabetic neuropathy-given his acute on chronic kidney injury I will hold his gabapentin for now.  7.  Depression-continue Zoloft.    All the records are reviewed and case discussed with ED provider. Management plans discussed with the patient, family and they are in agreement.  CODE STATUS: Full code  TOTAL TIME TAKING CARE OF THIS  PATIENT: 40 minutes.    Henreitta Leber M.D on 07/25/2018 at 3:52 PM  Between 7am to 6pm - Pager - [phone]  After 6pm go to [email or web address] - password [email or web address] [facility]  Office  [phone]  CC: Primary care physician; [name], MD

## 2018-07-26 ENCOUNTER — Observation Stay: Payer: Medicare HMO

## 2018-07-26 DIAGNOSIS — Z794 Long term (current) use of insulin: Secondary | ICD-10-CM | POA: Diagnosis not present

## 2018-07-26 DIAGNOSIS — N179 Acute kidney failure, unspecified: Secondary | ICD-10-CM | POA: Diagnosis present

## 2018-07-26 DIAGNOSIS — I129 Hypertensive chronic kidney disease with stage 1 through stage 4 chronic kidney disease, or unspecified chronic kidney disease: Secondary | ICD-10-CM | POA: Diagnosis present

## 2018-07-26 DIAGNOSIS — F17290 Nicotine dependence, other tobacco product, uncomplicated: Secondary | ICD-10-CM | POA: Diagnosis present

## 2018-07-26 DIAGNOSIS — Z888 Allergy status to other drugs, medicaments and biological substances status: Secondary | ICD-10-CM | POA: Diagnosis not present

## 2018-07-26 DIAGNOSIS — E162 Hypoglycemia, unspecified: Secondary | ICD-10-CM | POA: Diagnosis present

## 2018-07-26 DIAGNOSIS — F329 Major depressive disorder, single episode, unspecified: Secondary | ICD-10-CM | POA: Diagnosis present

## 2018-07-26 DIAGNOSIS — Z716 Tobacco abuse counseling: Secondary | ICD-10-CM | POA: Diagnosis not present

## 2018-07-26 DIAGNOSIS — Z79899 Other long term (current) drug therapy: Secondary | ICD-10-CM | POA: Diagnosis not present

## 2018-07-26 DIAGNOSIS — E1122 Type 2 diabetes mellitus with diabetic chronic kidney disease: Secondary | ICD-10-CM | POA: Diagnosis present

## 2018-07-26 DIAGNOSIS — G9341 Metabolic encephalopathy: Secondary | ICD-10-CM | POA: Diagnosis present

## 2018-07-26 DIAGNOSIS — Z8249 Family history of ischemic heart disease and other diseases of the circulatory system: Secondary | ICD-10-CM | POA: Diagnosis not present

## 2018-07-26 DIAGNOSIS — N183 Chronic kidney disease, stage 3 (moderate): Secondary | ICD-10-CM | POA: Diagnosis present

## 2018-07-26 DIAGNOSIS — E11649 Type 2 diabetes mellitus with hypoglycemia without coma: Secondary | ICD-10-CM | POA: Diagnosis present

## 2018-07-26 DIAGNOSIS — Z841 Family history of disorders of kidney and ureter: Secondary | ICD-10-CM | POA: Diagnosis not present

## 2018-07-26 DIAGNOSIS — Z8673 Personal history of transient ischemic attack (TIA), and cerebral infarction without residual deficits: Secondary | ICD-10-CM | POA: Diagnosis not present

## 2018-07-26 DIAGNOSIS — E860 Dehydration: Secondary | ICD-10-CM | POA: Diagnosis present

## 2018-07-26 DIAGNOSIS — E785 Hyperlipidemia, unspecified: Secondary | ICD-10-CM | POA: Diagnosis present

## 2018-07-26 DIAGNOSIS — K219 Gastro-esophageal reflux disease without esophagitis: Secondary | ICD-10-CM | POA: Diagnosis present

## 2018-07-26 LAB — BASIC METABOLIC PANEL
Anion gap: 6 (ref 5–15)
BUN: 33 mg/dL — ABNORMAL HIGH (ref 6–20)
CO2: 17 mmol/L — ABNORMAL LOW (ref 22–32)
Calcium: 7.6 mg/dL — ABNORMAL LOW (ref 8.9–10.3)
Chloride: 115 mmol/L — ABNORMAL HIGH (ref 98–111)
Creatinine, Ser: 3.11 mg/dL — ABNORMAL HIGH (ref 0.61–1.24)
GFR calc Af Amer: 24 mL/min — ABNORMAL LOW (ref >60)
GFR calc non Af Amer: 21 mL/min — ABNORMAL LOW (ref >60)
Glucose, Bld: 140 mg/dL — ABNORMAL HIGH (ref 70–99)
Potassium: 3.8 mmol/L (ref 3.5–5.1)
Sodium: 138 mmol/L (ref 135–145)

## 2018-07-26 LAB — GLUCOSE, CAPILLARY
Glucose-Capillary: 100 mg/dL — ABNORMAL HIGH (ref 70–99)
Glucose-Capillary: 142 mg/dL — ABNORMAL HIGH (ref 70–99)
Glucose-Capillary: 197 mg/dL — ABNORMAL HIGH (ref 70–99)
Glucose-Capillary: 175 mg/dL — ABNORMAL HIGH (ref 70–99)

## 2018-07-26 LAB — PROTEIN / CREATININE RATIO, URINE
Creatinine, Urine: 50 mg/dL
Protein Creatinine Ratio: 6.92 mg/mg{Cre} — ABNORMAL HIGH (ref 0.00–0.15)
Total Protein, Urine: 346 mg/dL

## 2018-07-26 LAB — HEMOGLOBIN A1C
Hgb A1c MFr Bld: 6.9 % — ABNORMAL HIGH (ref 4.8–5.6)
Mean Plasma Glucose: 151.33 mg/dL

## 2018-07-26 MED ORDER — METOPROLOL TARTRATE 25 MG PO TABS
25.0000 mg | ORAL_TABLET | Freq: Two times a day (BID) | ORAL | Status: DC
  Administered 2018-07-26 – 2018-07-27 (×3): 25 mg via ORAL
  Filled 2018-07-26 (×3): qty 1

## 2018-07-26 MED ORDER — NICOTINE 21 MG/24HR TD PT24
21.0000 mg | MEDICATED_PATCH | Freq: Every day | TRANSDERMAL | Status: DC
  Administered 2018-07-26: 21 mg via TRANSDERMAL
  Filled 2018-07-26: qty 1

## 2018-07-26 MED ORDER — ZOLPIDEM TARTRATE 5 MG PO TABS
5.0000 mg | ORAL_TABLET | Freq: Once | ORAL | Status: AC
  Administered 2018-07-26: 5 mg via ORAL
  Filled 2018-07-26: qty 1

## 2018-07-26 MED ORDER — AMLODIPINE BESYLATE 10 MG PO TABS
10.0000 mg | ORAL_TABLET | Freq: Every day | ORAL | Status: DC
  Administered 2018-07-26 – 2018-07-27 (×2): 10 mg via ORAL
  Filled 2018-07-26 (×2): qty 1

## 2018-07-26 NOTE — Progress Notes (Signed)
[facility], [place] 07/26/18  Subjective:   Patient known to our practice from previous admissions.  He presented to the emergency room via EMS from home after he was found unresponsive by family.  Per EMS glucose was low and patient was given dextrose in route.  Per ER notes, he drank some orange juice and ate a [place] sandwich in the ED. Lab results show increase in creatinine to 3.11.  Nephrology consult has now been requested for evaluation.  Doing fair this morning.  Denies any acute complaints.  Asking about going home.  No leg edema.  No shortness of breath.  Objective:  Vital signs in last 24 hours:  Temp:  [98.1 F (36.7 C)-98.7 F (37.1 C)] 98.7 F (37.1 C) (03/14 0431) Pulse Rate:  [61-82] 80 (03/14 0431) Resp:  [15-26] 18 (03/14 0431) BP: (151-223)/(87-131) 175/93 (03/14 0431) SpO2:  [97 %-100 %] 99 % (03/14 0431) Weight:  [75.2 kg] 75.2 kg (03/13 1932)  Weight change:  Filed Weights   07/25/18 1932  Weight: 75.2 kg    Intake/Output:    Intake/Output Summary (Last 24 hours) at 07/26/2018 1044 Last data filed at 07/26/2018 1000 Gross per 24 hour  Intake 1962.26 ml  Output 150 ml  Net 1812.26 ml    General appearance :  no acute distress, laying in bed HEENT: anicteric sclera, oropharynx clear,  Neck: Supple,  no masses  Chest: Clear to auscultation bilaterally, normal respiratory effort Cardiovascular: Regular rate rhythm, no rubs or gallops,  Abdomen: Soft non-tender, non distended Extremities:  no peripheral edema Neuro: Alert and oriented,  Skin: no acute rashes Psych: Normal mood and affect    Basic Metabolic Panel:  Recent Labs  Lab 07/25/18 1504 07/26/18 0438  NA 141 138  K 3.9 3.8  CL 116* 115*  CO2 20* 17*  GLUCOSE 207* 140*  BUN 31* 33*  CREATININE 2.89* 3.11*  CALCIUM 7.1* 7.6*     CBC: Recent Labs  Lab 07/25/18 1356  WBC 5.6  NEUTROABS 4.2  HGB 13.1  HCT 41.3  MCV 94.7  PLT 209     No  results found for: HEPBSAG, HEPBSAB, HEPBIGM    Microbiology:  No results found for this or any previous visit (from the past 240 hour(s)).  Coagulation Studies: No results for input(s): LABPROT, INR in the last 72 hours.  Urinalysis: Recent Labs    07/25/18 1416  COLORURINE STRAW*  LABSPEC 1.012  PHURINE 6.0  GLUCOSEU 50*  HGBUR SMALL*  BILIRUBINUR NEGATIVE  KETONESUR NEGATIVE  PROTEINUR 100*  NITRITE NEGATIVE  LEUKOCYTESUR NEGATIVE      Imaging: No results found.   Medications:   . sodium chloride 100 mL/hr at 07/26/18 0905   . amLODipine  10 mg Oral Daily  . cloNIDine  0.1 mg Oral BID  . heparin  5,000 Units Subcutaneous Q8H  . insulin aspart  0-5 Units Subcutaneous QHS  . insulin aspart  0-9 Units Subcutaneous TID WC  . metoCLOPramide  10 mg Oral TID AC  . pravastatin  20 mg Oral Nightly  . sertraline  50 mg Oral Daily   acetaminophen **OR** acetaminophen, hydrALAZINE, ondansetron **OR** ondansetron (ZOFRAN) IV  Assessment/ Plan:  58 y.o. male with chronic kidney disease, diabetes lipidemia, history of stroke, history of alcohol abuse presented to the hospital via EMS for altered mental status and hypoglycemia.  1.  Acute kidney injury -Urinalysis shows glucosuria, proteinuria, 6-10 RBCs 2.  Chronic kidney disease stage III.  Baseline  creatinine 1.6/GFR 45 from March 04, 2018 Last known creatinine from outpatient is 2.2/GFR 37 from April 29, 2018 [place] CKD is likely secondary to DM 3.  Proteinuria.  Previous work-up includes HIV -April 2019 4.  Diabetes type 2 with CKD.  Poorly controlled diabetes.  Last hemoglobin A1c 10.1% from December 2019  Will obtain renal ultrasound.  Screening serologies.  Complements, SPEP, UPEP, hemoglobin A1c Agree with holding lisinopril for now    LOS: 0 [name] [name] 3/14/202010:44 AM  [facility] [place], [place]  Note: This note was prepared with Dragon dictation. Any  transcription errors are unintentional

## 2018-07-26 NOTE — Progress Notes (Signed)
[place] [facility] [place] NAME: Norrin Shreffler    MR#:  [id number]  DATE OF BIRTH:  06/07/1960  SUBJECTIVE:   Patient wants to go home.  He says his wife is cheating on him and is leaving him today.  REVIEW OF SYSTEMS:    Review of Systems  Constitutional: Negative for fever, chills weight loss HENT: Negative for ear pain, nosebleeds, congestion, facial swelling, rhinorrhea, neck pain, neck stiffness and ear discharge.   Respiratory: Negative for cough, shortness of breath, wheezing  Cardiovascular: Negative for chest pain, palpitations and leg swelling.  Gastrointestinal: Negative for heartburn, abdominal pain, vomiting, diarrhea or consitpation Genitourinary: Negative for dysuria, urgency, frequency, hematuria Musculoskeletal: Negative for back pain or joint pain Neurological: Negative for dizziness, seizures, syncope, focal weakness,  numbness and headaches.  Hematological: Does not bruise/bleed easily.  Psychiatric/Behavioral: Negative for hallucinations, confusion, dysphoric mood    Tolerating Diet: yes      DRUG ALLERGIES:   Allergies  Allergen Reactions  . Ferrous Gluconate Nausea And Vomiting    VITALS:  Blood pressure (!) 181/107, pulse 75, temperature 97.9 F (36.6 C), resp. rate 18, height 5\' 11"  (1.803 m), weight 75.2 kg, SpO2 100 %.  PHYSICAL EXAMINATION:  Constitutional: Appears well-developed and well-nourished. No distress. HENT: Normocephalic. [name] Oropharynx is clear and moist.  Eyes: Conjunctivae and EOM are normal. PERRLA, no scleral icterus.  Neck: Normal ROM. Neck supple. No JVD. No tracheal deviation. CVS: RRR, S1/S2 +, no murmurs, no gallops, no carotid bruit.  Pulmonary: Effort and breath sounds normal, no stridor, rhonchi, wheezes, rales.  Abdominal: Soft. BS +,  no distension, tenderness, rebound or guarding.  Musculoskeletal: Normal range of motion. No edema and no tenderness.  Neuro: Alert. CN 2-12 grossly  intact. No focal deficits. Skin: Skin is warm and dry. No rash noted. Psychiatric: Normal mood and affect.      LABORATORY PANEL:   CBC Recent Labs  Lab 07/25/18 1356  WBC 5.6  HGB 13.1  HCT 41.3  PLT 209   ------------------------------------------------------------------------------------------------------------------  Chemistries  Recent Labs  Lab 07/26/18 0438  NA 138  K 3.8  CL 115*  CO2 17*  GLUCOSE 140*  BUN 33*  CREATININE 3.11*  CALCIUM 7.6*   ------------------------------------------------------------------------------------------------------------------  Cardiac Enzymes No results for input(s): TROPONINI in the last 168 hours. ------------------------------------------------------------------------------------------------------------------  RADIOLOGY:  No results found.   ASSESSMENT AND PLAN:   58 year old male with history of hypertension, diabetes and chronic kidney disease stage III who presented to the emergency room due to altered mental status.  1.  Acute metabolic encephalopathy in the setting of dehydration with acute on chronic kidney injury and hypoglycemia.  Mental status is at baseline.  2.  Acute on chronic renal failure stage III: This is a combination of dehydration with poor p.o. intake as well as medication induced. Nephrology consultation placed. Renal ultrasound requested. Hold all nephrotoxic medications and repeat BMP in a.m.  3.  Hypoglycemia due to increased creatinine: Continue to follow Sliding scale insulin  4.  Accelerated hypertension: I started Norvasc and metoprolol. Stop lisinopril for now due to problem #2. Continue clonidine.  5.  Hyperlipidemia: Continue statin  6.  Depression: Continue Zoloft 7. Tobacco dependence: Patient is encouraged to quit smoking and willing to attempt to quit was assessed. Patient highly motivated.Counseling was provided for 4 minutes.    Management plans discussed with the  patient and he is in agreement.  CODE STATUS: full  TOTAL TIME  TAKING CARE OF THIS PATIENT: 26 minutes.     POSSIBLE D/C 1-2 days, DEPENDING ON CLINICAL CONDITION.   Lavonda Thal M.D on 07/26/2018 at 12:33 PM  Between 7am to 6pm - Pager - [phone] After 6pm go to [email or web address] - password [email or web address] [facility]  Office  [phone]  CC: Primary care physician; [name], MD  Note: This dictation was prepared with Dragon dictation along with smaller phrase technology. Any transcriptional errors that result from this process are unintentional.

## 2018-07-26 NOTE — Care Management Obs Status (Signed)
[facility] NOTIFICATION   Patient Details  Name: [name] MRN: [id number] Date of Birth: 15-Mar-1961   Medicare Observation Status Notification Given:  Yes    Dariel Pellecchia A Kirstina Leinweber, RN 07/26/2018, 10:26 AM

## 2018-07-27 LAB — BASIC METABOLIC PANEL
Anion gap: 7 (ref 5–15)
BUN: 34 mg/dL — ABNORMAL HIGH (ref 6–20)
CO2: 16 mmol/L — ABNORMAL LOW (ref 22–32)
Calcium: 8 mg/dL — ABNORMAL LOW (ref 8.9–10.3)
Chloride: 115 mmol/L — ABNORMAL HIGH (ref 98–111)
Creatinine, Ser: 2.87 mg/dL — ABNORMAL HIGH (ref 0.61–1.24)
GFR calc Af Amer: 27 mL/min — ABNORMAL LOW (ref >60)
GFR calc non Af Amer: 23 mL/min — ABNORMAL LOW (ref >60)
Glucose, Bld: 205 mg/dL — ABNORMAL HIGH (ref 70–99)
Potassium: 3.7 mmol/L (ref 3.5–5.1)
Sodium: 138 mmol/L (ref 135–145)

## 2018-07-27 LAB — GLUCOSE, CAPILLARY
Glucose-Capillary: 175 mg/dL — ABNORMAL HIGH (ref 70–99)
Glucose-Capillary: 225 mg/dL — ABNORMAL HIGH (ref 70–99)

## 2018-07-27 MED ORDER — METOPROLOL TARTRATE 75 MG PO TABS: 75.0000 mg | ORAL_TABLET | Freq: Two times a day (BID) | ORAL | 0 refills | Status: DC

## 2018-07-27 MED ORDER — NICOTINE 21 MG/24HR TD PT24: 21.0000 mg | MEDICATED_PATCH | Freq: Every day | TRANSDERMAL | 0 refills | Status: DC

## 2018-07-27 MED ORDER — INSULIN GLARGINE 100 UNIT/ML ~~LOC~~ SOLN: 15.0000 [IU] | Freq: Every day | SUBCUTANEOUS | 11 refills | Status: DC

## 2018-07-27 MED ORDER — GABAPENTIN 300 MG PO CAPS: 300.0000 mg | ORAL_CAPSULE | Freq: Two times a day (BID) | ORAL | 0 refills | Status: DC

## 2018-07-27 MED ORDER — AMLODIPINE BESYLATE 10 MG PO TABS: 10.0000 mg | ORAL_TABLET | Freq: Every day | ORAL | 0 refills | Status: DC

## 2018-07-27 NOTE — Progress Notes (Signed)
[name] to be D/C'd Home per MD order.  Discussed prescriptions and follow up appointments with the patient. Prescriptions given to patient, medication list explained in detail. Pt verbalized understanding.  Allergies as of 07/27/2018      Reactions   Ferrous Gluconate Nausea And Vomiting      Medication List    STOP taking these medications   lisinopril 10 MG tablet Commonly known as:  PRINIVIL,ZESTRIL     TAKE these medications   amLODipine 10 MG tablet Commonly known as:  NORVASC Take 1 tablet (10 mg total) by mouth daily.   gabapentin 300 MG capsule Commonly known as:  NEURONTIN Take 1 capsule (300 mg total) by mouth 2 (two) times daily. What changed:  when to take this   glimepiride 4 MG tablet Commonly known as:  AMARYL Take 4 mg by mouth daily.   insulin glargine 100 UNIT/ML injection Commonly known as:  LANTUS Inject 0.15 mLs (15 Units total) into the skin daily. What changed:  when to take this   metoCLOPramide 10 MG tablet Commonly known as:  REGLAN Take 1 tablet (10 mg total) by mouth 3 (three) times daily before meals.   Metoprolol Tartrate 75 MG Tabs Take 75 mg by mouth 2 (two) times daily.   nicotine 21 mg/24hr patch Commonly known as:  NICODERM CQ - dosed in mg/24 hours Place 1 patch (21 mg total) onto the skin daily.   pravastatin 20 MG tablet Commonly known as:  PRAVACHOL Take 20 mg by mouth Nightly.   sertraline 50 MG tablet Commonly known as:  ZOLOFT Take 50 mg by mouth daily.       Vitals:   07/27/18 0336 07/27/18 0457  BP: (!) 181/95 (!) 141/74  Pulse: 74 72  Resp: 20   Temp: 98.2 F (36.8 C)   SpO2: 99%     Skin clean, dry and intact without evidence of skin break down, no evidence of skin tears noted. IV catheter discontinued intact. Site without signs and symptoms of complications. Dressing and pressure applied. Pt denies pain at this time. No complaints noted.  An After Visit Summary was printed and given to the  patient. Patient escorted via [place], and D/C home via private auto.  Reginald Mandril, RN

## 2018-07-27 NOTE — Discharge Summary (Signed)
[place] [facility] [place] NAME: [name]    MR#:  [id number]  DATE OF BIRTH:  1961-03-27  DATE OF ADMISSION:  07/25/2018 ADMITTING PHYSICIAN: Henreitta Leber, MD  DATE OF DISCHARGE: 07/27/2018  PRIMARY CARE PHYSICIAN: [name], MD    ADMISSION DIAGNOSIS:  Hypoglycemia [E16.2] AKI (acute kidney injury) ([place]) [N17.9]  DISCHARGE DIAGNOSIS:  Active Problems:   Acute on chronic renal failure (HCC)   Acute kidney injury ([place])   SECONDARY DIAGNOSIS:   Past Medical History:  Diagnosis Date  . Chronic kidney disease   . Diabetes mellitus without complication ([place])   . ETOH abuse   . Hyperlipidemia   . Hypertension   . Stroke [facility])     HOSPITAL COURSE:    58 year old male with history of hypertension, diabetes and chronic kidney disease stage III who presented to the emergency room due to altered mental status.  1.  Acute metabolic encephalopathy in the setting of dehydration with acute on chronic kidney injury and hypoglycemia.  His mental status is at baseline.  2.  Acute on chronic renal failure stage III:  Increased creatinine was due to dehydration which improved with IV fluids.  Baseline creatinine is about 2.2.  He was evaluated by nephrology.  He will have outpatient follow-up for his chronic kidney disease.  He will avoid nephrotoxic medications.  Lisinopril has been discontinued for now.   Renal ultrasound showed no evidence of hydronephrosis.   3.  Hypoglycemia due to increased creatinine: Patient may continue Amaryl and insulin dose has been reduced to daily.  His A1c was 6.9.   4.  Accelerated hypertension: He will continue on Norvasc and metoprolol which are new medications for blood pressure.  He needs close monitoring of his blood pressure.  5.  Hyperlipidemia: Continue statin  6.  Depression: Continue Zoloft 7. Tobacco dependence: Patient is encouraged to quit smoking and willing to attempt to quit was  assessed. He wanted nicotine patch at discharge.  DISCHARGE CONDITIONS AND DIET:   Stable for discharge on diabetic heart healthy diet  CONSULTS OBTAINED:  Treatment Team:  Murlean Iba, MD  DRUG ALLERGIES:   Allergies  Allergen Reactions  . Ferrous Gluconate Nausea And Vomiting    DISCHARGE MEDICATIONS:   Allergies as of 07/27/2018      Reactions   Ferrous Gluconate Nausea And Vomiting      Medication List    STOP taking these medications   lisinopril 10 MG tablet Commonly known as:  PRINIVIL,ZESTRIL     TAKE these medications   amLODipine 10 MG tablet Commonly known as:  NORVASC Take 1 tablet (10 mg total) by mouth daily.   gabapentin 300 MG capsule Commonly known as:  NEURONTIN Take 1 capsule (300 mg total) by mouth 2 (two) times daily. What changed:  when to take this   glimepiride 4 MG tablet Commonly known as:  AMARYL Take 4 mg by mouth daily.   insulin glargine 100 UNIT/ML injection Commonly known as:  LANTUS Inject 0.15 mLs (15 Units total) into the skin daily. What changed:  when to take this   metoCLOPramide 10 MG tablet Commonly known as:  REGLAN Take 1 tablet (10 mg total) by mouth 3 (three) times daily before meals.   Metoprolol Tartrate 75 MG Tabs Take 75 mg by mouth 2 (two) times daily.   nicotine 21 mg/24hr patch Commonly known as:  NICODERM CQ - dosed in mg/24 hours Place 1 patch (21 mg  total) onto the skin daily.   pravastatin 20 MG tablet Commonly known as:  PRAVACHOL Take 20 mg by mouth Nightly.   sertraline 50 MG tablet Commonly known as:  ZOLOFT Take 50 mg by mouth daily.         Today   CHIEF COMPLAINT:  Patient is ready for discharge today no acute issues overnight.   VITAL SIGNS:  Blood pressure (!) 141/74, pulse 72, temperature 98.2 F (36.8 C), temperature source Oral, resp. rate 20, height 5\' 11"  (1.803 m), weight 75.2 kg, SpO2 99 %.   REVIEW OF SYSTEMS:  Review of Systems  Constitutional: Negative.   Negative for chills, fever and malaise/fatigue.  HENT: Negative.  Negative for ear discharge, ear pain, hearing loss, nosebleeds and sore throat.   Eyes: Negative.  Negative for blurred vision and pain.  Respiratory: Negative.  Negative for cough, hemoptysis, shortness of breath and wheezing.   Cardiovascular: Negative.  Negative for chest pain, palpitations and leg swelling.  Gastrointestinal: Negative.  Negative for abdominal pain, blood in stool, diarrhea, nausea and vomiting.  Genitourinary: Negative.  Negative for dysuria.  Musculoskeletal: Negative.  Negative for back pain.  Skin: Negative.   Neurological: Negative for dizziness, tremors, speech change, focal weakness, seizures and headaches.  Endo/Heme/Allergies: Negative.  Does not bruise/bleed easily.  Psychiatric/Behavioral: Negative.  Negative for depression, hallucinations and suicidal ideas.     PHYSICAL EXAMINATION:  GENERAL:  58 y.o.-year-old patient lying in the bed with no acute distress.  NECK:  Supple, no jugular venous distention. No thyroid enlargement, no tenderness.  LUNGS: Normal breath sounds bilaterally, no wheezing, rales,rhonchi  No use of accessory muscles of respiration.  CARDIOVASCULAR: S1, S2 normal. No murmurs, rubs, or gallops.  ABDOMEN: Soft, non-tender, non-distended. Bowel sounds present. No organomegaly or mass.  EXTREMITIES: No pedal edema, cyanosis, or clubbing.  PSYCHIATRIC: The patient is alert and oriented x 3.  SKIN: No obvious rash, lesion, or ulcer.   DATA REVIEW:   CBC Recent Labs  Lab 07/25/18 1356  WBC 5.6  HGB 13.1  HCT 41.3  PLT 209    Chemistries  Recent Labs  Lab 07/27/18 0438  NA 138  K 3.7  CL 115*  CO2 16*  GLUCOSE 205*  BUN 34*  CREATININE 2.87*  CALCIUM 8.0*    Cardiac Enzymes No results for input(s): TROPONINI in the last 168 hours.  Microbiology Results  @MICRORSLT48 @  RADIOLOGY:  US Renal  Result Date: 07/26/2018 CLINICAL DATA:  58 year old male  with acute renal failure. EXAM: RENAL / URINARY TRACT ULTRASOUND COMPLETE COMPARISON:  02/27/2018 FINDINGS: Right Kidney: Renal measurements: 9.8 x 5.5 x 5.8 cm = volume: 163 mL . Echogenicity within normal limits. No mass or hydronephrosis visualized. Left Kidney: Renal measurements: 10.4 x 4.7 x 4.7 cm = volume: 116 mL. Echogenicity within normal limits. No mass or hydronephrosis visualized. Bladder: Appears normal for degree of bladder distention. IMPRESSION: Unremarkable renal ultrasound. Electronically Signed   By: Margarette Canada M.D.   On: 07/26/2018 14:08      Allergies as of 07/27/2018      Reactions   Ferrous Gluconate Nausea And Vomiting      Medication List    STOP taking these medications   lisinopril 10 MG tablet Commonly known as:  PRINIVIL,ZESTRIL     TAKE these medications   amLODipine 10 MG tablet Commonly known as:  NORVASC Take 1 tablet (10 mg total) by mouth daily.   gabapentin 300 MG capsule Commonly known as:  NEURONTIN Take 1 capsule (300 mg total) by mouth 2 (two) times daily. What changed:  when to take this   glimepiride 4 MG tablet Commonly known as:  AMARYL Take 4 mg by mouth daily.   insulin glargine 100 UNIT/ML injection Commonly known as:  LANTUS Inject 0.15 mLs (15 Units total) into the skin daily. What changed:  when to take this   metoCLOPramide 10 MG tablet Commonly known as:  REGLAN Take 1 tablet (10 mg total) by mouth 3 (three) times daily before meals.   Metoprolol Tartrate 75 MG Tabs Take 75 mg by mouth 2 (two) times daily.   nicotine 21 mg/24hr patch Commonly known as:  NICODERM CQ - dosed in mg/24 hours Place 1 patch (21 mg total) onto the skin daily.   pravastatin 20 MG tablet Commonly known as:  PRAVACHOL Take 20 mg by mouth Nightly.   sertraline 50 MG tablet Commonly known as:  ZOLOFT Take 50 mg by mouth daily.          Management plans discussed with the patient and he is in agreement. Stable for discharge  home  Patient should follow up with nephrology  CODE STATUS:     Code Status Orders  (From admission, onward)         Start     Ordered   07/25/18 1843  Full code  Continuous     07/25/18 1842        Code Status History    Date Active Date Inactive Code Status Order ID Comments User Context   02/26/2018 2332 03/04/2018 1859 Full Code [id number]  Lance Coon, MD Inpatient   01/15/2018 2158 01/16/2018 1909 Full Code [id number]  Henreitta Leber, MD Inpatient   08/17/2017 0000 08/21/2017 1532 Full Code [id number]  Lance Coon, MD ED   06/29/2016 2124 07/01/2016 1805 Full Code [id number]  Vaughan Basta, MD Inpatient      TOTAL TIME TAKING CARE OF THIS PATIENT: 38 minutes.    Note: This dictation was prepared with Dragon dictation along with smaller phrase technology. Any transcriptional errors that result from this process are unintentional.  Kamil Mchaffie M.D on 07/27/2018 at 8:58 AM  Between 7am to 6pm - Pager - [phone] After 6pm go to [email or web address] - password [email or web address]  Office  [phone]  CC: Primary care physician; [name], MD

## 2018-07-28 LAB — C4 COMPLEMENT: Complement C4, Body Fluid: 30 mg/dL (ref 14–44)

## 2018-07-28 LAB — ENA+DNA/DS+ANTICH+CENTRO+JO...
Anti JO-1: <0.2 AI (ref 0.0–0.9)
Centromere Ab Screen: <0.2 AI (ref 0.0–0.9)
Chromatin Ab SerPl-aCnc: <0.2 AI (ref 0.0–0.9)
ENA SM Ab Ser-aCnc: <0.2 AI (ref 0.0–0.9)
Ribonucleic Protein: 1.1 AI — ABNORMAL HIGH (ref 0.0–0.9)
SSA (Ro) (ENA) Antibody, IgG: <0.2 AI (ref 0.0–0.9)
SSB (La) (ENA) Antibody, IgG: <0.2 AI (ref 0.0–0.9)
Scleroderma (Scl-70) (ENA) Antibody, IgG: <0.2 AI (ref 0.0–0.9)
ds DNA Ab: 10 IU/mL — ABNORMAL HIGH (ref 0–9)

## 2018-07-28 LAB — PROTEIN ELECTROPHORESIS, SERUM
A/G Ratio: 0.9 (ref 0.7–1.7)
Albumin ELP: 2.4 g/dL — ABNORMAL LOW (ref 2.9–4.4)
Alpha-1-Globulin: 0.2 g/dL (ref 0.0–0.4)
Alpha-2-Globulin: 0.9 g/dL (ref 0.4–1.0)
Beta Globulin: 0.7 g/dL (ref 0.7–1.3)
Gamma Globulin: 0.9 g/dL (ref 0.4–1.8)
Globulin, Total: 2.7 g/dL (ref 2.2–3.9)
Total Protein ELP: 5.1 g/dL — ABNORMAL LOW (ref 6.0–8.5)

## 2018-07-28 LAB — ANCA TITERS
Atypical P-ANCA titer: <1:20 {titer}
C-ANCA: <1:20 {titer}
P-ANCA: <1:20 {titer}

## 2018-07-28 LAB — KAPPA/LAMBDA LIGHT CHAINS
Kappa free light chain: 102.2 mg/L — ABNORMAL HIGH (ref 3.3–19.4)
Kappa, lambda light chain ratio: 1 (ref 0.26–1.65)
Lambda free light chains: 102.7 mg/L — ABNORMAL HIGH (ref 5.7–26.3)

## 2018-07-28 LAB — ANA W/REFLEX IF POSITIVE: Anti Nuclear Antibody(ANA): POSITIVE — AB

## 2018-07-28 LAB — C3 COMPLEMENT: C3 Complement: 122 mg/dL (ref 82–167)

## 2018-08-01 LAB — PROTEIN ELECTRO, RANDOM URINE
Albumin ELP, Urine: 48.4 %
Alpha-1-Globulin, U: 8 %
Alpha-2-Globulin, U: 8.1 %
Beta Globulin, U: 16 %
Gamma Globulin, U: 19.6 %
Total Protein, Urine: 369.4 mg/dL

## 2018-08-08 ENCOUNTER — Encounter: Payer: Self-pay | Admitting: Emergency Medicine

## 2018-08-08 ENCOUNTER — Other Ambulatory Visit: Payer: Self-pay

## 2018-08-08 ENCOUNTER — Emergency Department
Admission: EM | Admit: 2018-08-08 | Discharge: 2018-08-08 | Disposition: A | Discharge status: Home / Self Care | Payer: Medicare HMO | Attending: Emergency Medicine | Admitting: Emergency Medicine

## 2018-08-08 DIAGNOSIS — E162 Hypoglycemia, unspecified: Secondary | ICD-10-CM | POA: Diagnosis not present

## 2018-08-08 DIAGNOSIS — E875 Hyperkalemia: Secondary | ICD-10-CM | POA: Diagnosis not present

## 2018-08-08 DIAGNOSIS — R4182 Altered mental status, unspecified: Secondary | ICD-10-CM | POA: Diagnosis present

## 2018-08-08 DIAGNOSIS — Z794 Long term (current) use of insulin: Secondary | ICD-10-CM | POA: Insufficient documentation

## 2018-08-08 DIAGNOSIS — F17210 Nicotine dependence, cigarettes, uncomplicated: Secondary | ICD-10-CM | POA: Insufficient documentation

## 2018-08-08 DIAGNOSIS — I159 Secondary hypertension, unspecified: Secondary | ICD-10-CM | POA: Diagnosis not present

## 2018-08-08 DIAGNOSIS — Z131 Encounter for screening for diabetes mellitus: Secondary | ICD-10-CM | POA: Diagnosis not present

## 2018-08-08 DIAGNOSIS — G4700 Insomnia, unspecified: Secondary | ICD-10-CM | POA: Diagnosis not present

## 2018-08-08 DIAGNOSIS — E7800 Pure hypercholesterolemia, unspecified: Secondary | ICD-10-CM | POA: Diagnosis not present

## 2018-08-08 DIAGNOSIS — R404 Transient alteration of awareness: Secondary | ICD-10-CM | POA: Diagnosis not present

## 2018-08-08 DIAGNOSIS — E161 Other hypoglycemia: Secondary | ICD-10-CM | POA: Diagnosis not present

## 2018-08-08 DIAGNOSIS — Z79899 Other long term (current) drug therapy: Secondary | ICD-10-CM | POA: Diagnosis not present

## 2018-08-08 DIAGNOSIS — I959 Hypotension, unspecified: Secondary | ICD-10-CM | POA: Diagnosis not present

## 2018-08-08 DIAGNOSIS — N189 Chronic kidney disease, unspecified: Secondary | ICD-10-CM | POA: Insufficient documentation

## 2018-08-08 DIAGNOSIS — Z13220 Encounter for screening for lipoid disorders: Secondary | ICD-10-CM | POA: Diagnosis not present

## 2018-08-08 DIAGNOSIS — E11649 Type 2 diabetes mellitus with hypoglycemia without coma: Secondary | ICD-10-CM | POA: Diagnosis not present

## 2018-08-08 DIAGNOSIS — R8290 Unspecified abnormal findings in urine: Secondary | ICD-10-CM | POA: Diagnosis not present

## 2018-08-08 DIAGNOSIS — I129 Hypertensive chronic kidney disease with stage 1 through stage 4 chronic kidney disease, or unspecified chronic kidney disease: Secondary | ICD-10-CM | POA: Diagnosis not present

## 2018-08-08 DIAGNOSIS — E1122 Type 2 diabetes mellitus with diabetic chronic kidney disease: Secondary | ICD-10-CM | POA: Diagnosis not present

## 2018-08-08 DIAGNOSIS — N183 Chronic kidney disease, stage 3 (moderate): Secondary | ICD-10-CM | POA: Diagnosis not present

## 2018-08-08 LAB — COMPREHENSIVE METABOLIC PANEL
ALT: 38 U/L (ref 0–44)
AST: 50 U/L — ABNORMAL HIGH (ref 15–41)
Albumin: 2.5 g/dL — ABNORMAL LOW (ref 3.5–5.0)
Alkaline Phosphatase: 95 U/L (ref 38–126)
Anion gap: 11 (ref 5–15)
BUN: 49 mg/dL — ABNORMAL HIGH (ref 6–20)
CO2: 17 mmol/L — ABNORMAL LOW (ref 22–32)
Calcium: 7.9 mg/dL — ABNORMAL LOW (ref 8.9–10.3)
Chloride: 111 mmol/L (ref 98–111)
Creatinine, Ser: 3.89 mg/dL — ABNORMAL HIGH (ref 0.61–1.24)
GFR calc Af Amer: 19 mL/min — ABNORMAL LOW (ref >60)
GFR calc non Af Amer: 16 mL/min — ABNORMAL LOW (ref >60)
Glucose, Bld: 96 mg/dL (ref 70–99)
Potassium: 4 mmol/L (ref 3.5–5.1)
Sodium: 139 mmol/L (ref 135–145)
Total Bilirubin: 0.6 mg/dL (ref 0.3–1.2)
Total Protein: 6.4 g/dL — ABNORMAL LOW (ref 6.5–8.1)

## 2018-08-08 LAB — URINALYSIS, COMPLETE (UACMP) WITH MICROSCOPIC
Bilirubin Urine: NEGATIVE
Glucose, UA: 50 mg/dL — AB
Ketones, ur: NEGATIVE mg/dL
Leukocytes,Ua: NEGATIVE
Nitrite: NEGATIVE
Protein, ur: 100 mg/dL — AB
Specific Gravity, Urine: 1.01 (ref 1.005–1.030)
pH: 6 (ref 5.0–8.0)

## 2018-08-08 LAB — CBC WITH DIFFERENTIAL/PLATELET
Abs Immature Granulocytes: 0.01 10*3/uL (ref 0.00–0.07)
Basophils Absolute: 0 10*3/uL (ref 0.0–0.1)
Basophils Relative: 0 %
Eosinophils Absolute: 0 10*3/uL (ref 0.0–0.5)
Eosinophils Relative: 1 %
HCT: 38.8 % — ABNORMAL LOW (ref 39.0–52.0)
Hemoglobin: 12.4 g/dL — ABNORMAL LOW (ref 13.0–17.0)
Immature Granulocytes: 0 %
Lymphocytes Relative: 25 %
Lymphs Abs: 1.4 10*3/uL (ref 0.7–4.0)
MCH: 30.3 pg (ref 26.0–34.0)
MCHC: 32 g/dL (ref 30.0–36.0)
MCV: 94.9 fL (ref 80.0–100.0)
Monocytes Absolute: 0.5 10*3/uL (ref 0.1–1.0)
Monocytes Relative: 9 %
Neutro Abs: 3.7 10*3/uL (ref 1.7–7.7)
Neutrophils Relative %: 65 %
Platelets: 231 10*3/uL (ref 150–400)
RBC: 4.09 MIL/uL — ABNORMAL LOW (ref 4.22–5.81)
RDW: 14.1 % (ref 11.5–15.5)
WBC: 5.7 10*3/uL (ref 4.0–10.5)
nRBC: 0 % (ref 0.0–0.2)

## 2018-08-08 LAB — GLUCOSE, CAPILLARY
Glucose-Capillary: 63 mg/dL — ABNORMAL LOW (ref 70–99)
Glucose-Capillary: 140 mg/dL — ABNORMAL HIGH (ref 70–99)
Glucose-Capillary: 298 mg/dL — ABNORMAL HIGH (ref 70–99)

## 2018-08-08 NOTE — ED Provider Notes (Signed)
[facility] Emergency Department Provider Note  Time seen: 6:19 PM  I have reviewed the triage vital signs and the nursing notes.   HISTORY  Chief Complaint Hypoglycemia   HPI [name] is a 58 y.o. male with a past medical history of CKD, diabetes, hyperlipidemia, hypertension, presents to the emergency department with hypoglycemia.  According to the patient, he states he was acting abnormal today and talking out of his head, his wife checked his blood sugar and it was 30.  She then brought him to the primary care doctor, he said he was feeling better at that time however shortly after going home he once again began acting funny, they eventually called EMS and they state his blood glucose was 33 upon their arrival.  I started the patient on D10 fluids and brought the patient to the emergency department.  Patient is currently awake and alert although his responses are somewhat sluggish.  He states he ate a bowl of Cheerios this morning but is not eaten anything this afternoon or evening.  Patient appears to take insulin as well as glimepiride.     Past Medical History:  Diagnosis Date  . Chronic kidney disease   . Diabetes mellitus without complication ([place])   . ETOH abuse   . Hyperlipidemia   . Hypertension   . Stroke [facility])     Patient Active Problem List   Diagnosis Date Noted  . Acute kidney injury ([place]) 07/26/2018  . Acute on chronic renal failure ([place]) 07/25/2018  . CVA (cerebral vascular accident) ([place]) 03/17/2018  . UTI (urinary tract infection) 02/27/2018  . Hypoglycemia 01/15/2018  . Type 2 diabetes mellitus without complication, with long-term current use of insulin ([place]) 08/27/2017  . Protein-calorie malnutrition, severe 08/19/2017  . Pancreatitis, acute 08/16/2017  . DKA (diabetic ketoacidoses) ([place]) 08/16/2017  . HTN (hypertension) 08/16/2017  . HLD (hyperlipidemia) 08/16/2017  . Carotid stenosis 08/02/2016  . GERD (gastroesophageal  reflux disease) 08/02/2016  . History of esophagogastroduodenoscopy (EGD) 07/01/2016  . History of recent blood transfusion 07/01/2016  . Acute renal failure superimposed on stage 3 chronic kidney disease ([place]) 07/01/2016  . Hyponatremia 07/01/2016  . Uncontrolled diabetes mellitus ([place]) 07/01/2016  . Alcohol abuse 07/01/2016  . Monilial esophagitis ([place]) 07/01/2016  . Tobacco abuse 07/01/2016  . Confusion 06/29/2016  . Iron deficiency anemia 06/29/2016  . Coagulopathy ([place]) 06/29/2016    Past Surgical History:  Procedure Laterality Date  . ESOPHAGOGASTRODUODENOSCOPY (EGD) WITH PROPOFOL N/A 07/01/2016   Procedure: ESOPHAGOGASTRODUODENOSCOPY (EGD) WITH PROPOFOL;  Surgeon: San Jetty, MD;  Location: [facility] ENDOSCOPY;  Service: General;  Laterality: N/A;    Prior to Admission medications   Medication Sig Start Date End Date Taking? Authorizing Provider  amLODipine (NORVASC) 10 MG tablet Take 1 tablet (10 mg total) by mouth daily. 07/27/18   Bettey Costa, MD  gabapentin (NEURONTIN) 300 MG capsule Take 1 capsule (300 mg total) by mouth 2 (two) times daily. 07/27/18   Bettey Costa, MD  glimepiride (AMARYL) 4 MG tablet Take 4 mg by mouth daily. 03/25/18   [provider]  insulin glargine (LANTUS) 100 UNIT/ML injection Inject 0.15 mLs (15 Units total) into the skin daily. 07/27/18   Bettey Costa, MD  metoCLOPramide (REGLAN) 10 MG tablet Take 1 tablet (10 mg total) by mouth 3 (three) times daily before meals. 03/04/18   Dustin Flock, MD  metoprolol tartrate 75 MG TABS Take 75 mg by mouth 2 (two) times daily. 07/27/18   Bettey Costa, MD  nicotine (NICODERM CQ - DOSED IN MG/24 HOURS) 21 mg/24hr patch Place 1 patch (21 mg total) onto the skin daily. 07/27/18   Bettey Costa, MD  pravastatin (PRAVACHOL) 20 MG tablet Take 20 mg by mouth Nightly. 04/29/18   [provider]  sertraline (ZOLOFT) 50 MG tablet Take 50 mg by mouth daily.  12/19/17   [provider]    Allergies   Allergen Reactions  . Ferrous Gluconate Nausea And Vomiting    Family History  Problem Relation Age of Onset  . CAD Brother   . Dementia Mother   . Renal Disease Father     Social History Social History   Tobacco Use  . Smoking status: Current Every Day Smoker    Packs/day: 0.25    Years: 5.00    Pack years: 1.25    Types: Cigars  . Smokeless tobacco: Never Used  Substance Use Topics  . Alcohol use: Yes    Alcohol/week: 10.0 standard drinks    Types: 8 Cans of beer, 2 Shots of liquor per week  . Drug use: No    Review of Systems Constitutional: Negative for fever. Cardiovascular: Negative for chest pain. Respiratory: Negative for shortness of breath. Gastrointestinal: Negative for abdominal pain, vomiting Genitourinary: Negative for urinary compaints Musculoskeletal: Negative for musculoskeletal complaints Skin: Negative for skin complaints  Neurological: Negative for headache All other ROS negative  ____________________________________________   PHYSICAL EXAM:  VITAL SIGNS: ED Triage Vitals  Enc Vitals Group     BP 08/08/18 1743 (!) 164/102     Pulse Rate 08/08/18 1743 67     Resp 08/08/18 1743 14     Temp --      Temp src --      SpO2 08/08/18 1743 92 %     Weight 08/08/18 1744 165 lb 12.6 oz (75.2 kg)     Height 08/08/18 1744 5\' 11"  (1.803 m)     Head Circumference --      Peak Flow --      Pain Score 08/08/18 1744 0     Pain Loc --      Pain Edu? --      Excl. in [place]? --    Constitutional: Alert and oriented. Well appearing and in no distress. Eyes: Normal exam ENT   Head: Normocephalic and atraumatic.   Mouth/Throat: Mucous membranes are moist. Cardiovascular: Normal rate, regular rhythm. No murmur Respiratory: Normal respiratory effort without tachypnea nor retractions. Breath sounds are clear  Gastrointestinal: Soft and nontender. No distention.   Musculoskeletal: Nontender with normal range of motion in all extremities.   Neurologic:  Normal speech and language. No gross focal neurologic deficits  Skin:  Skin is warm, dry and intact.  Psychiatric: Mood and affect are normal.  ____________________________________________   INITIAL IMPRESSION / ASSESSMENT AND PLAN / ED COURSE  Pertinent labs & imaging results that were available during my care of the patient were reviewed by me and considered in my medical decision making (see chart for details).  Patient presents to the emergency department with hypoglycemia and altered mental status.  We will stop the D10 infusion, we will feed the patient and continue to closely monitor his blood glucose.  We will check labs as well as urinalysis.  Patient is also made it very clear that he does not want to be admitted to the hospital after I discussed with the patient as he is on glimepiride I would like to admit him to the hospital for continued  observation.  He is agreeable to stay for labs, he will eat and we will continue to reassess in the emergency department.  Patient is requesting to leave.  He has been eating.  Patient's blood glucose has remained elevated currently 298 after eating a sandwich tray.  Patient appears very well, his labs are consistent with renal insufficiency which could possibly be prolonging the half-life of glimepiride.  I discussed with the patient if he wants to go home he needs to ensure that he is checking his blood glucose throughout the day at least 4 times.  If his blood glucose is to drop again he needs to return immediately to the emergency department.  Patient is agreeable to plan of care.  Patient appears to have capacity to make his own medical decisions and strongly wishes to avoid admission to the hospital and is asking to be discharged at this time.  I discussed with the patient drinking plenty of fluids following up with his doctor on Monday and again checking his blood glucose at least 4 times  daily.  ____________________________________________   FINAL CLINICAL IMPRESSION(S) / ED DIAGNOSES  Hypoglycemia   Harvest Dark, MD 08/08/18 1958

## 2018-08-08 NOTE — ED Notes (Signed)
Wife [name] requesting a call from Dr/RN after patient gets checked in and plan established. [phone].

## 2018-08-08 NOTE — ED Triage Notes (Signed)
PT arrives via ems from home with hypoglycemic reading of 33. Pt had a low blood sugar reading this morning and was seen at his PCP who sent him back home. Pt's family was not satisfied with pt's PCP treatment and called ems. Upon ems arrival pt was altered and was hypoglycemic at 33. PT arrives alert & o x 4 but speech slurred. EMS iniated IV and started d10; pt received 12.5 of dextrose prior to arrival. Pt's blood sugar 63 in ED.

## 2018-08-08 NOTE — ED Notes (Signed)
Reviewed discharge instructions, follow-up care with patient. Patient verbalized understanding of all information reviewed. Patient stable, with no distress noted at this time.    

## 2018-08-08 NOTE — ED Notes (Signed)
Pt given meal & beverage

## 2018-08-08 NOTE — Discharge Instructions (Addendum)
As we discussed please drink plenty of fluids over neck several days.  Follow-up with your doctor on Monday for recheck of your labs including kidney function.  Please check your blood glucose at least 4 times daily.  Return immediately to the emergency department for any low blood glucose readings.

## 2018-08-08 NOTE — ED Notes (Signed)
Informed MD of patient's CBG

## 2018-08-11 DIAGNOSIS — E1122 Type 2 diabetes mellitus with diabetic chronic kidney disease: Secondary | ICD-10-CM | POA: Diagnosis not present

## 2018-08-11 DIAGNOSIS — I159 Secondary hypertension, unspecified: Secondary | ICD-10-CM | POA: Diagnosis not present

## 2018-08-11 DIAGNOSIS — N183 Chronic kidney disease, stage 3 (moderate): Secondary | ICD-10-CM | POA: Diagnosis not present

## 2018-08-11 DIAGNOSIS — E875 Hyperkalemia: Secondary | ICD-10-CM | POA: Diagnosis not present

## 2018-08-11 DIAGNOSIS — Z794 Long term (current) use of insulin: Secondary | ICD-10-CM | POA: Diagnosis not present

## 2018-08-11 DIAGNOSIS — Z13220 Encounter for screening for lipoid disorders: Secondary | ICD-10-CM | POA: Diagnosis not present

## 2018-08-11 DIAGNOSIS — Z131 Encounter for screening for diabetes mellitus: Secondary | ICD-10-CM | POA: Diagnosis not present

## 2018-08-11 DIAGNOSIS — E7800 Pure hypercholesterolemia, unspecified: Secondary | ICD-10-CM | POA: Diagnosis not present

## 2018-08-11 DIAGNOSIS — R8290 Unspecified abnormal findings in urine: Secondary | ICD-10-CM | POA: Diagnosis not present

## 2018-08-26 DIAGNOSIS — I129 Hypertensive chronic kidney disease with stage 1 through stage 4 chronic kidney disease, or unspecified chronic kidney disease: Secondary | ICD-10-CM | POA: Diagnosis not present

## 2018-08-26 DIAGNOSIS — N179 Acute kidney failure, unspecified: Secondary | ICD-10-CM | POA: Diagnosis not present

## 2018-08-26 DIAGNOSIS — N184 Chronic kidney disease, stage 4 (severe): Secondary | ICD-10-CM | POA: Diagnosis not present

## 2018-08-26 DIAGNOSIS — E1129 Type 2 diabetes mellitus with other diabetic kidney complication: Secondary | ICD-10-CM | POA: Diagnosis not present

## 2019-03-04 ENCOUNTER — Encounter: Payer: Self-pay | Admitting: Emergency Medicine

## 2019-03-04 ENCOUNTER — Other Ambulatory Visit: Payer: Self-pay

## 2019-03-04 ENCOUNTER — Inpatient Hospital Stay
Admission: EM | Admit: 2019-03-04 | Discharge: 2019-03-07 | DRG: 684 | Disposition: A | Discharge status: Home Health | Payer: Medicare Other | Attending: Internal Medicine | Admitting: Internal Medicine

## 2019-03-04 DIAGNOSIS — R197 Diarrhea, unspecified: Secondary | ICD-10-CM | POA: Diagnosis present

## 2019-03-04 DIAGNOSIS — E860 Dehydration: Secondary | ICD-10-CM | POA: Diagnosis present

## 2019-03-04 DIAGNOSIS — Z794 Long term (current) use of insulin: Secondary | ICD-10-CM | POA: Diagnosis not present

## 2019-03-04 DIAGNOSIS — Z79899 Other long term (current) drug therapy: Secondary | ICD-10-CM

## 2019-03-04 DIAGNOSIS — E785 Hyperlipidemia, unspecified: Secondary | ICD-10-CM | POA: Diagnosis present

## 2019-03-04 DIAGNOSIS — N1330 Unspecified hydronephrosis: Secondary | ICD-10-CM

## 2019-03-04 DIAGNOSIS — K635 Polyp of colon: Secondary | ICD-10-CM

## 2019-03-04 DIAGNOSIS — Z8673 Personal history of transient ischemic attack (TIA), and cerebral infarction without residual deficits: Secondary | ICD-10-CM

## 2019-03-04 DIAGNOSIS — E1122 Type 2 diabetes mellitus with diabetic chronic kidney disease: Secondary | ICD-10-CM | POA: Diagnosis present

## 2019-03-04 DIAGNOSIS — F329 Major depressive disorder, single episode, unspecified: Secondary | ICD-10-CM | POA: Diagnosis present

## 2019-03-04 DIAGNOSIS — K641 Second degree hemorrhoids: Secondary | ICD-10-CM | POA: Diagnosis present

## 2019-03-04 DIAGNOSIS — F1010 Alcohol abuse, uncomplicated: Secondary | ICD-10-CM | POA: Diagnosis present

## 2019-03-04 DIAGNOSIS — K219 Gastro-esophageal reflux disease without esophagitis: Secondary | ICD-10-CM | POA: Diagnosis present

## 2019-03-04 DIAGNOSIS — N189 Chronic kidney disease, unspecified: Secondary | ICD-10-CM

## 2019-03-04 DIAGNOSIS — I129 Hypertensive chronic kidney disease with stage 1 through stage 4 chronic kidney disease, or unspecified chronic kidney disease: Secondary | ICD-10-CM | POA: Diagnosis present

## 2019-03-04 DIAGNOSIS — E1142 Type 2 diabetes mellitus with diabetic polyneuropathy: Secondary | ICD-10-CM | POA: Diagnosis present

## 2019-03-04 DIAGNOSIS — N179 Acute kidney failure, unspecified: Secondary | ICD-10-CM | POA: Diagnosis present

## 2019-03-04 DIAGNOSIS — N184 Chronic kidney disease, stage 4 (severe): Secondary | ICD-10-CM | POA: Diagnosis present

## 2019-03-04 DIAGNOSIS — K529 Noninfective gastroenteritis and colitis, unspecified: Secondary | ICD-10-CM | POA: Diagnosis present

## 2019-03-04 DIAGNOSIS — F17290 Nicotine dependence, other tobacco product, uncomplicated: Secondary | ICD-10-CM | POA: Diagnosis present

## 2019-03-04 DIAGNOSIS — Z20828 Contact with and (suspected) exposure to other viral communicable diseases: Secondary | ICD-10-CM | POA: Diagnosis present

## 2019-03-04 LAB — COMPREHENSIVE METABOLIC PANEL
ALT: 17 U/L (ref 0–44)
AST: 21 U/L (ref 15–41)
Albumin: 2.9 g/dL — ABNORMAL LOW (ref 3.5–5.0)
Alkaline Phosphatase: 78 U/L (ref 38–126)
Anion gap: 8 (ref 5–15)
BUN: 44 mg/dL — ABNORMAL HIGH (ref 6–20)
CO2: 16 mmol/L — ABNORMAL LOW (ref 22–32)
Calcium: 7.8 mg/dL — ABNORMAL LOW (ref 8.9–10.3)
Chloride: 113 mmol/L — ABNORMAL HIGH (ref 98–111)
Creatinine, Ser: 5.01 mg/dL — ABNORMAL HIGH (ref 0.61–1.24)
GFR calc Af Amer: 14 mL/min — ABNORMAL LOW (ref >60)
GFR calc non Af Amer: 12 mL/min — ABNORMAL LOW (ref >60)
Glucose, Bld: 313 mg/dL — ABNORMAL HIGH (ref 70–99)
Potassium: 4.4 mmol/L (ref 3.5–5.1)
Sodium: 137 mmol/L (ref 135–145)
Total Bilirubin: 0.6 mg/dL (ref 0.3–1.2)
Total Protein: 7 g/dL (ref 6.5–8.1)

## 2019-03-04 LAB — CBC
HCT: 37.2 % — ABNORMAL LOW (ref 39.0–52.0)
Hemoglobin: 12.1 g/dL — ABNORMAL LOW (ref 13.0–17.0)
MCH: 30.7 pg (ref 26.0–34.0)
MCHC: 32.5 g/dL (ref 30.0–36.0)
MCV: 94.4 fL (ref 80.0–100.0)
Platelets: 176 10*3/uL (ref 150–400)
RBC: 3.94 MIL/uL — ABNORMAL LOW (ref 4.22–5.81)
RDW: 13.2 % (ref 11.5–15.5)
WBC: 5.2 10*3/uL (ref 4.0–10.5)
nRBC: 0 % (ref 0.0–0.2)

## 2019-03-04 LAB — URINALYSIS, COMPLETE (UACMP) WITH MICROSCOPIC
Bacteria, UA: NONE SEEN
Bilirubin Urine: NEGATIVE
Glucose, UA: >=500 mg/dL — AB
Ketones, ur: NEGATIVE mg/dL
Leukocytes,Ua: NEGATIVE
Nitrite: NEGATIVE
Protein, ur: >=300 mg/dL — AB
Specific Gravity, Urine: 1.009 (ref 1.005–1.030)
Squamous Epithelial / HPF: NONE SEEN (ref 0–5)
pH: 6 (ref 5.0–8.0)

## 2019-03-04 LAB — GLUCOSE, CAPILLARY: Glucose-Capillary: 277 mg/dL — ABNORMAL HIGH (ref 70–99)

## 2019-03-04 LAB — LIPASE, BLOOD: Lipase: 84 U/L — ABNORMAL HIGH (ref 11–51)

## 2019-03-04 MED ORDER — INSULIN ASPART 100 UNIT/ML ~~LOC~~ SOLN
0.0000 [IU] | Freq: Three times a day (TID) | SUBCUTANEOUS | Status: DC
  Administered 2019-03-05: 3 [IU] via SUBCUTANEOUS
  Administered 2019-03-05: 5 [IU] via SUBCUTANEOUS
  Filled 2019-03-04 (×2): qty 1

## 2019-03-04 MED ORDER — SODIUM CHLORIDE 0.9 % IV BOLUS
1000.0000 mL | Freq: Once | INTRAVENOUS | Status: AC
  Administered 2019-03-04: 1000 mL via INTRAVENOUS

## 2019-03-04 MED ORDER — INSULIN ASPART 100 UNIT/ML ~~LOC~~ SOLN
0.0000 [IU] | Freq: Every day | SUBCUTANEOUS | Status: DC
  Administered 2019-03-04: 3 [IU] via SUBCUTANEOUS
  Administered 2019-03-05 – 2019-03-07 (×2): 2 [IU] via SUBCUTANEOUS
  Filled 2019-03-04 (×3): qty 1

## 2019-03-04 MED ORDER — SODIUM CHLORIDE 0.9 % IV BOLUS
500.0000 mL | Freq: Once | INTRAVENOUS | Status: AC
  Administered 2019-03-04: 500 mL via INTRAVENOUS

## 2019-03-04 MED ORDER — GABAPENTIN 100 MG PO CAPS
100.0000 mg | ORAL_CAPSULE | Freq: Two times a day (BID) | ORAL | Status: DC
  Administered 2019-03-05 – 2019-03-07 (×4): 100 mg via ORAL
  Filled 2019-03-04 (×4): qty 1

## 2019-03-04 MED ORDER — INSULIN GLARGINE 100 UNIT/ML ~~LOC~~ SOLN
8.0000 [IU] | Freq: Every day | SUBCUTANEOUS | Status: DC
  Administered 2019-03-05: 8 [IU] via SUBCUTANEOUS
  Filled 2019-03-04 (×4): qty 0.08

## 2019-03-04 MED ORDER — HEPARIN SODIUM (PORCINE) 5000 UNIT/ML IJ SOLN
5000.0000 [IU] | Freq: Three times a day (TID) | INTRAMUSCULAR | Status: DC
  Administered 2019-03-04 – 2019-03-07 (×5): 5000 [IU] via SUBCUTANEOUS
  Filled 2019-03-04 (×6): qty 1

## 2019-03-04 MED ORDER — SERTRALINE HCL 50 MG PO TABS
50.0000 mg | ORAL_TABLET | Freq: Every day | ORAL | Status: DC
  Administered 2019-03-05 – 2019-03-07 (×2): 50 mg via ORAL
  Filled 2019-03-04 (×2): qty 1

## 2019-03-04 MED ORDER — AMLODIPINE BESYLATE 10 MG PO TABS
10.0000 mg | ORAL_TABLET | Freq: Every day | ORAL | Status: DC
  Administered 2019-03-04 – 2019-03-07 (×3): 10 mg via ORAL
  Filled 2019-03-04 (×2): qty 1
  Filled 2019-03-04: qty 2

## 2019-03-04 MED ORDER — PRAVASTATIN SODIUM 20 MG PO TABS
20.0000 mg | ORAL_TABLET | Freq: Every day | ORAL | Status: DC
  Administered 2019-03-05 – 2019-03-07 (×2): 20 mg via ORAL
  Filled 2019-03-04 (×2): qty 1

## 2019-03-04 MED ORDER — NICOTINE 21 MG/24HR TD PT24: 21.0000 mg | MEDICATED_PATCH | Freq: Every day | TRANSDERMAL | Status: DC

## 2019-03-04 MED ORDER — METOPROLOL TARTRATE 75 MG PO TABS: 75.0000 mg | ORAL_TABLET | Freq: Two times a day (BID) | ORAL | Status: DC

## 2019-03-04 MED ORDER — METOCLOPRAMIDE HCL 10 MG PO TABS: 10.0000 mg | ORAL_TABLET | Freq: Three times a day (TID) | ORAL | Status: DC

## 2019-03-04 MED ORDER — SODIUM CHLORIDE 0.9 % IV SOLN
INTRAVENOUS | Status: DC
  Administered 2019-03-04: 125 mL/h via INTRAVENOUS
  Administered 2019-03-05 – 2019-03-07 (×4): via INTRAVENOUS

## 2019-03-04 NOTE — ED Notes (Signed)
Warm blanket and graham crackers given to pt by this tech

## 2019-03-04 NOTE — ED Notes (Signed)
Blood Glucose 227

## 2019-03-04 NOTE — ED Provider Notes (Signed)
[facility] Emergency Department Provider Note   ____________________________________________   First MD Initiated Contact with Patient 03/04/19 2027     (approximate)  I have reviewed the triage vital signs and the nursing notes.   HISTORY  Chief Complaint Diarrhea    HPI [name] is a 58 y.o. male here for evaluation for "dehydration"  Patient reports that his primary doctor obtain labs because he continues to have diarrhea and feeling very fatigued and was told that he needs to come to the ER.  Reports has had loose stools about 3-4 very loose watery stools daily for over a month now.  He was not having any pain, no fevers or chills.  He tries to keep up with eating and drinking, but feels dehydrated and no he cannot get enough fluids because of the diarrhea  Denies any known infection.  Has not been seen by gastroenterology or had any stool cultures done.  No chest pain no shortness of breath.  No exposure to Covid.  No loss of taste or smell or other Covid-like symptoms.   Past Medical History:  Diagnosis Date  . Chronic kidney disease   . Diabetes mellitus without complication ([place])   . ETOH abuse   . Hyperlipidemia   . Hypertension   . Stroke [facility])     Patient Active Problem List   Diagnosis Date Noted  . Acute kidney injury ([place]) 07/26/2018  . Acute on chronic renal failure ([place]) 07/25/2018  . CVA (cerebral vascular accident) ([place]) 03/17/2018  . UTI (urinary tract infection) 02/27/2018  . Hypoglycemia 01/15/2018  . Type 2 diabetes mellitus without complication, with long-term current use of insulin ([place]) 08/27/2017  . Protein-calorie malnutrition, severe 08/19/2017  . Pancreatitis, acute 08/16/2017  . DKA (diabetic ketoacidoses) ([place]) 08/16/2017  . HTN (hypertension) 08/16/2017  . HLD (hyperlipidemia) 08/16/2017  . Carotid stenosis 08/02/2016  . GERD (gastroesophageal reflux disease) 08/02/2016  . History of  esophagogastroduodenoscopy (EGD) 07/01/2016  . History of recent blood transfusion 07/01/2016  . Acute renal failure superimposed on stage 3 chronic kidney disease ([place]) 07/01/2016  . Hyponatremia 07/01/2016  . Uncontrolled diabetes mellitus ([place]) 07/01/2016  . Alcohol abuse 07/01/2016  . Monilial esophagitis ([place]) 07/01/2016  . Tobacco abuse 07/01/2016  . Confusion 06/29/2016  . Iron deficiency anemia 06/29/2016  . Coagulopathy ([place]) 06/29/2016    Past Surgical History:  Procedure Laterality Date  . ESOPHAGOGASTRODUODENOSCOPY (EGD) WITH PROPOFOL N/A 07/01/2016   Procedure: ESOPHAGOGASTRODUODENOSCOPY (EGD) WITH PROPOFOL;  Surgeon: San Jetty, MD;  Location: [facility] ENDOSCOPY;  Service: General;  Laterality: N/A;    Prior to Admission medications   Medication Sig Start Date End Date Taking? Authorizing Provider  amLODipine (NORVASC) 10 MG tablet Take 1 tablet (10 mg total) by mouth daily. 07/27/18   Bettey Costa, MD  gabapentin (NEURONTIN) 300 MG capsule Take 1 capsule (300 mg total) by mouth 2 (two) times daily. 07/27/18   Bettey Costa, MD  glimepiride (AMARYL) 4 MG tablet Take 4 mg by mouth daily. 03/25/18   [provider]  insulin glargine (LANTUS) 100 UNIT/ML injection Inject 0.15 mLs (15 Units total) into the skin daily. 07/27/18   Bettey Costa, MD  metoCLOPramide (REGLAN) 10 MG tablet Take 1 tablet (10 mg total) by mouth 3 (three) times daily before meals. 03/04/18   Dustin Flock, MD  metoprolol tartrate 75 MG TABS Take 75 mg by mouth 2 (two) times daily. 07/27/18   Bettey Costa, MD  nicotine (NICODERM CQ - DOSED  IN MG/24 HOURS) 21 mg/24hr patch Place 1 patch (21 mg total) onto the skin daily. 07/27/18   Bettey Costa, MD  pravastatin (PRAVACHOL) 20 MG tablet Take 20 mg by mouth Nightly. 04/29/18   [provider]  sertraline (ZOLOFT) 50 MG tablet Take 50 mg by mouth daily.  12/19/17   [provider]    Allergies Ferrous gluconate  Family History  Problem  Relation Age of Onset  . CAD Brother   . Dementia Mother   . Renal Disease Father     Social History Social History   Tobacco Use  . Smoking status: Current Every Day Smoker    Packs/day: 0.25    Years: 5.00    Pack years: 1.25    Types: Cigars  . Smokeless tobacco: Never Used  Substance Use Topics  . Alcohol use: Yes    Alcohol/week: 16.0 standard drinks    Types: 2 Shots of liquor, 14 Cans of beer per week  . Drug use: No    Review of Systems Constitutional: No fever/chills Eyes: No visual changes. ENT: No sore throat.  Mouth feels dry. Cardiovascular: Denies chest pain. Respiratory: Denies shortness of breath. Gastrointestinal: No abdominal pain.  No nausea or vomiting. Genitourinary: Negative for dysuria.  Continues to urinate normal amount Musculoskeletal: Negative for back pain. Skin: Negative for rash. Neurological: Negative for headaches, areas of focal weakness or numbness.  Feels very fatigued.  He is not a dialysis patient  ____________________________________________   PHYSICAL EXAM:  VITAL SIGNS: ED Triage Vitals  Enc Vitals Group     BP 03/04/19 1755 139/83     Pulse Rate 03/04/19 1755 71     Resp 03/04/19 1755 14     Temp 03/04/19 1755 (!) 97.5 F (36.4 C)     Temp Source 03/04/19 1755 Oral     SpO2 03/04/19 1755 100 %     Weight 03/04/19 1756 147 lb 11.3 oz (67 kg)     Height 03/04/19 1756 5\' 11"  (1.803 m)     Head Circumference --      Peak Flow --      Pain Score 03/04/19 1756 0     Pain Loc --      Pain Edu? --      Excl. in [place]? --     Constitutional: Alert and oriented.  Mildly ill, fatigued in appearance but in no acute distress.  Very pleasant. Eyes: Conjunctivae are normal. Head: Atraumatic. Nose: No congestion/rhinnorhea. Mouth/Throat: Mucous membranes are dry. Neck: No stridor.  Cardiovascular: Normal rate, regular rhythm. Grossly normal heart sounds.  Good peripheral circulation. Respiratory: Normal respiratory effort.  No  retractions. Lungs CTAB. Gastrointestinal: Soft and nontender. No distention. Musculoskeletal: No lower extremity tenderness nor edema. Neurologic:  Normal speech and language. No gross focal neurologic deficits are appreciated.  Skin:  Skin is warm, dry and intact. No rash noted. Psychiatric: Mood and affect are normal. Speech and behavior are normal.  ____________________________________________   LABS (all labs ordered are listed, but only abnormal results are displayed)  Labs Reviewed  LIPASE, BLOOD - Abnormal; Notable for the following components:      Result Value   Lipase 84 (*)    All other components within normal limits  COMPREHENSIVE METABOLIC PANEL - Abnormal; Notable for the following components:   Chloride 113 (*)    CO2 16 (*)    Glucose, Bld 313 (*)    BUN 44 (*)    Creatinine, Ser 5.01 (*)  Calcium 7.8 (*)    Albumin 2.9 (*)    GFR calc non Af Amer 12 (*)    GFR calc Af Amer 14 (*)    All other components within normal limits  CBC - Abnormal; Notable for the following components:   RBC 3.94 (*)    Hemoglobin 12.1 (*)    HCT 37.2 (*)    All other components within normal limits  GI PATHOGEN PANEL BY PCR, STOOL  C DIFFICILE QUICK SCREEN W PCR REFLEX  SARS CORONAVIRUS 2 (TAT 6-24 HRS)  URINALYSIS, COMPLETE (UACMP) WITH MICROSCOPIC   ____________________________________________  EKG   ____________________________________________  RADIOLOGY  No denoted acute etiology for imaging, patient reports chronic loose stools without associated abdominal pain.  Afebrile. ____________________________________________   PROCEDURES  Procedure(s) performed: None  Procedures  Critical Care performed: No  ____________________________________________   INITIAL IMPRESSION / ASSESSMENT AND PLAN / ED COURSE  Pertinent labs & imaging results that were available during my care of the patient were reviewed by me and considered in my medical decision making (see  chart for details).   Patient presents with loose stools, worsening renal function, low bicarbonate and mildly elevated glucose.  Normal anion gap.  Patient denies any associated Covid-like symptoms.  Denies obvious infectious etiology, will order stool studies.  Discussed with the patient with his worsening renal function acute on chronic given his creatinine is now greater than 5, patient being hydrated and I discussed with patient will admit him for further evaluation of his worsening renal function and symptoms of loose stools and diarrhea.  Patient agreement  TERIQ RABON was evaluated in Emergency Department on 03/04/2019 for the symptoms described in the history of present illness. He was evaluated in the context of the global COVID-19 pandemic, which necessitated consideration that the patient might be at risk for infection with the SARS-CoV-2 virus that causes COVID-19. Institutional protocols and algorithms that pertain to the evaluation of patients at risk for COVID-19 are in a state of rapid change based on information released by regulatory bodies including the CDC and federal and state organizations. These policies and algorithms were followed during the patient's care in the ED.       ____________________________________________   FINAL CLINICAL IMPRESSION(S) / ED DIAGNOSES  Final diagnoses:  Diarrhea, unspecified type  Acute renal failure superimposed on chronic kidney disease, unspecified CKD stage, unspecified acute renal failure type [facility])        Note:  This document was prepared using Dragon voice recognition software and may include unintentional dictation errors       Delman Kitten, MD 03/04/19 2101

## 2019-03-04 NOTE — ED Notes (Signed)
Patient states "he will leave if he does not receive an inpatient bed within 30 mins". Will call bed placement

## 2019-03-04 NOTE — ED Notes (Signed)
Assumed care of patient reports wa sent to ed via pov from home with abnormal labs. Patient denies any discomforts. Stated just felling fatigued, but went for routine md appointment today.

## 2019-03-04 NOTE — ED Triage Notes (Signed)
Pt to ED from home referred by Dr. Ginette Pitman for dehydration.  States has had diarrhea x1 week.  Denies pain or SOB.  Denies fevers, denies loss of taste or smell.

## 2019-03-05 DIAGNOSIS — R197 Diarrhea, unspecified: Secondary | ICD-10-CM

## 2019-03-05 LAB — GLUCOSE, CAPILLARY
Glucose-Capillary: 230 mg/dL — ABNORMAL HIGH (ref 70–99)
Glucose-Capillary: 233 mg/dL — ABNORMAL HIGH (ref 70–99)
Glucose-Capillary: 70 mg/dL (ref 70–99)
Glucose-Capillary: 297 mg/dL — ABNORMAL HIGH (ref 70–99)
Glucose-Capillary: 206 mg/dL — ABNORMAL HIGH (ref 70–99)

## 2019-03-05 LAB — HIV ANTIBODY (ROUTINE TESTING W REFLEX): HIV Screen 4th Generation wRfx: NONREACTIVE

## 2019-03-05 LAB — BASIC METABOLIC PANEL
Anion gap: 6 (ref 5–15)
BUN: 43 mg/dL — ABNORMAL HIGH (ref 6–20)
CO2: 17 mmol/L — ABNORMAL LOW (ref 22–32)
Calcium: 7.2 mg/dL — ABNORMAL LOW (ref 8.9–10.3)
Chloride: 117 mmol/L — ABNORMAL HIGH (ref 98–111)
Creatinine, Ser: 4.7 mg/dL — ABNORMAL HIGH (ref 0.61–1.24)
GFR calc Af Amer: 15 mL/min — ABNORMAL LOW (ref >60)
GFR calc non Af Amer: 13 mL/min — ABNORMAL LOW (ref >60)
Glucose, Bld: 301 mg/dL — ABNORMAL HIGH (ref 70–99)
Potassium: 4 mmol/L (ref 3.5–5.1)
Sodium: 140 mmol/L (ref 135–145)

## 2019-03-05 LAB — C DIFFICILE QUICK SCREEN W PCR REFLEX
C Diff antigen: NEGATIVE
C Diff interpretation: NOT DETECTED
C Diff toxin: NEGATIVE

## 2019-03-05 LAB — SARS CORONAVIRUS 2 (TAT 6-24 HRS): SARS Coronavirus 2: NEGATIVE

## 2019-03-05 LAB — C-REACTIVE PROTEIN: CRP: <0.8 mg/dL (ref <1.0)

## 2019-03-05 LAB — HEMOGLOBIN A1C
Hgb A1c MFr Bld: 6.9 % — ABNORMAL HIGH (ref 4.8–5.6)
Mean Plasma Glucose: 151.33 mg/dL

## 2019-03-05 LAB — SEDIMENTATION RATE: Sed Rate: 52 mm/hr — ABNORMAL HIGH (ref 0–20)

## 2019-03-05 MED ORDER — LOPERAMIDE HCL 2 MG PO CAPS: 2.0000 mg | ORAL_CAPSULE | Freq: Four times a day (QID) | ORAL | Status: DC | PRN

## 2019-03-05 MED ORDER — LISINOPRIL 20 MG PO TABS: 30.0000 mg | ORAL_TABLET | Freq: Every day | ORAL | Status: DC

## 2019-03-05 MED ORDER — PANTOPRAZOLE SODIUM 40 MG PO TBEC
40.0000 mg | DELAYED_RELEASE_TABLET | Freq: Every day | ORAL | Status: DC
  Administered 2019-03-05 – 2019-03-07 (×2): 40 mg via ORAL
  Filled 2019-03-05 (×2): qty 1

## 2019-03-05 MED ORDER — METRONIDAZOLE 500 MG PO TABS
500.0000 mg | ORAL_TABLET | Freq: Three times a day (TID) | ORAL | Status: DC
  Administered 2019-03-05 – 2019-03-07 (×6): 500 mg via ORAL
  Filled 2019-03-05 (×9): qty 1

## 2019-03-05 MED ORDER — PEG 3350-KCL-NA BICARB-NACL 420 G PO SOLR
4000.0000 mL | Freq: Once | ORAL | Status: AC
  Administered 2019-03-05: 4000 mL via ORAL
  Filled 2019-03-05: qty 4000

## 2019-03-05 MED ORDER — ADULT MULTIVITAMIN W/MINERALS CH
1.0000 | ORAL_TABLET | Freq: Every day | ORAL | Status: DC
  Administered 2019-03-07: 1 via ORAL
  Filled 2019-03-05: qty 1

## 2019-03-05 MED ORDER — METOPROLOL TARTRATE 50 MG PO TABS
50.0000 mg | ORAL_TABLET | Freq: Two times a day (BID) | ORAL | Status: DC
  Administered 2019-03-05 – 2019-03-07 (×5): 50 mg via ORAL
  Filled 2019-03-05 (×5): qty 1

## 2019-03-05 MED ORDER — NEPRO/CARBSTEADY PO LIQD
237.0000 mL | Freq: Two times a day (BID) | ORAL | Status: DC
  Administered 2019-03-05 (×2): 237 mL via ORAL

## 2019-03-05 NOTE — H&P (Signed)
[place] [facility] [place] NAME: [name]    MR#:  [id number]  DATE OF BIRTH:  1960-08-07  DATE OF ADMISSION:  03/04/2019  PRIMARY CARE PHYSICIAN: [name], MD   REQUESTING/REFERRING PHYSICIAN: Delman Kitten, MD  CHIEF COMPLAINT:   Chief Complaint  Patient presents with  . Diarrhea    HISTORY OF PRESENT ILLNESS:  58 y.o. male with pertinent past medical history of diabetes mellitus, DKA, carotid stenosis, iron deficiency anemia, CVA x3, hyperlipidemia, hypertension, tobacco abuse, CKD, EtOH abuse, and pancreatitis presenting to the ED with chief complaints of diarrhea and dehydration x2 weeks.  Patient report onset of diarrhea since early September and is progressively gotten worse in the last week.  Patient describes watery stools occurring 6-10 times per day. He denies blood in stool, fever, weight loss, anorexia, or fecal incontinence.  Patient has not traveled internationally or taken any antibiotics in the last 3 months.  Denies dietary triggers, starting any medication recently or recent sick contacts.  Patient was evaluated by his PCP and was noted to have elevated BUN and creatinine therefore was sent to the ED for further evaluation.  On arrival to the ED, he was afebrile with blood pressure 174/99 mm Hg and pulse rate 70 beats/min. There were no focal neurological deficits; he was alert and oriented x4, and he did not demonstrate any memory deficits.  Initial labs revealed lipase 84, blood glucose 313, BUN 44, creatinine 5.01, calcium 7.8.  Hospitalist asked to admit for further evaluation and management  PAST MEDICAL HISTORY:   Past Medical History:  Diagnosis Date  . Chronic kidney disease   . Diabetes mellitus without complication ([place])   . ETOH abuse   . Hyperlipidemia   . Hypertension   . Stroke [facility])     PAST SURGICAL HISTORY:   Past Surgical History:  Procedure Laterality Date  . ESOPHAGOGASTRODUODENOSCOPY  (EGD) WITH PROPOFOL N/A 07/01/2016   Procedure: ESOPHAGOGASTRODUODENOSCOPY (EGD) WITH PROPOFOL;  Surgeon: San Jetty, MD;  Location: [facility] ENDOSCOPY;  Service: General;  Laterality: N/A;    SOCIAL HISTORY:   Social History   Tobacco Use  . Smoking status: Current Every Day Smoker    Packs/day: 0.25    Years: 5.00    Pack years: 1.25    Types: Cigars  . Smokeless tobacco: Never Used  Substance Use Topics  . Alcohol use: Yes    Alcohol/week: 16.0 standard drinks    Types: 2 Shots of liquor, 14 Cans of beer per week    FAMILY HISTORY:   Family History  Problem Relation Age of Onset  . CAD Brother   . Dementia Mother   . Renal Disease Father     DRUG ALLERGIES:   Allergies  Allergen Reactions  . Ferrous Gluconate Nausea And Vomiting    REVIEW OF SYSTEMS:   Review of Systems  Constitutional: Negative for chills, fever, malaise/fatigue and weight loss.  HENT: Negative for congestion, hearing loss and sore throat.   Eyes: Negative for blurred vision and double vision.  Respiratory: Negative for cough, shortness of breath and wheezing.   Cardiovascular: Negative for chest pain, palpitations, orthopnea and leg swelling.  Gastrointestinal: Positive for diarrhea. Negative for abdominal pain, nausea and vomiting.  Genitourinary: Negative for dysuria and urgency.  Musculoskeletal: Negative for myalgias.  Skin: Negative for rash.  Neurological: Negative for dizziness, sensory change, speech change, focal weakness and headaches.  Psychiatric/Behavioral: Positive for substance abuse. Negative for depression.  MEDICATIONS AT HOME:   Prior to Admission medications   Medication Sig Start Date End Date Taking? Authorizing Provider  amLODipine (NORVASC) 10 MG tablet Take 1 tablet (10 mg total) by mouth daily. 07/27/18  Yes Mody, Ulice Bold, MD  gabapentin (NEURONTIN) 300 MG capsule Take 1 capsule (300 mg total) by mouth 2 (two) times daily. Patient taking differently: Take 300 mg by  mouth 3 (three) times daily.  07/27/18  Yes Mody, Sital, MD  glimepiride (AMARYL) 4 MG tablet Take 4 mg by mouth daily. 03/25/18  Yes [provider]  insulin glargine (LANTUS) 100 UNIT/ML injection Inject 0.15 mLs (15 Units total) into the skin daily. Patient taking differently: Inject 8 Units into the skin daily.  07/27/18  Yes Mody, Ulice Bold, MD  lisinopril (ZESTRIL) 30 MG tablet Take 30 mg by mouth daily. 02/12/19  Yes [provider]  loperamide (IMODIUM A-D) 2 MG tablet Take 2 mg by mouth 4 (four) times daily as needed for diarrhea or loose stools. 03/04/19 03/19/19 Yes [provider]  metoprolol tartrate (LOPRESSOR) 50 MG tablet Take 50 mg by mouth 2 (two) times daily. 02/16/19 02/16/20 Yes [provider]  metroNIDAZOLE (FLAGYL) 500 MG tablet Take 500 mg by mouth 3 (three) times daily. 03/04/19  Yes [provider]  pantoprazole (PROTONIX) 40 MG tablet Take 40 mg by mouth daily. 12/18/18  Yes [provider]  sertraline (ZOLOFT) 50 MG tablet Take 50 mg by mouth daily.  12/19/17  Yes [provider]  sodium bicarbonate 650 MG tablet Take 650 mg by mouth 2 (two) times daily. 03/01/19  Yes [provider]      VITAL SIGNS:  Blood pressure (!) 155/87, pulse 75, temperature 98 F (36.7 C), temperature source Oral, resp. rate 20, height '5\' 11"'$  (1.803 m), weight 66.9 kg, SpO2 100 %.  PHYSICAL EXAMINATION:   Physical Exam  GENERAL:  58 y.o.-year-old patient lying in the bed with no acute distress.  EYES: Pupils equal, round, reactive to light and accommodation. No scleral icterus. Extraocular muscles intact.  HEENT: Head atraumatic, normocephalic. Oropharynx and nasopharynx clear.  NECK:  Supple, no jugular venous distention. No thyroid enlargement, no tenderness.  LUNGS: Normal breath sounds bilaterally, no wheezing, rales,rhonchi or crepitation. No use of accessory muscles of respiration.  CARDIOVASCULAR: S1, S2 normal. No  murmurs, rubs, or gallops.  ABDOMEN: Soft, nontender, nondistended. Bowel sounds present. No organomegaly or mass.  EXTREMITIES: No pedal edema, cyanosis, or clubbing.  NEUROLOGIC: Cranial nerves II through XII are intact. Muscle strength 5/5 in all extremities. Sensation intact. Gait not checked.  PSYCHIATRIC: The patient is alert and oriented x 3.  SKIN: No obvious rash, lesion, or ulcer.   DATA REVIEWED:  LABORATORY PANEL:   CBC Recent Labs  Lab 03/04/19 1808  WBC 5.2  HGB 12.1*  HCT 37.2*  PLT 176   ------------------------------------------------------------------------------------------------------------------  Chemistries  Recent Labs  Lab 03/04/19 1808  NA 137  K 4.4  CL 113*  CO2 16*  GLUCOSE 313*  BUN 44*  CREATININE 5.01*  CALCIUM 7.8*  AST 21  ALT 17  ALKPHOS 78  BILITOT 0.6   ------------------------------------------------------------------------------------------------------------------  Cardiac Enzymes No results for input(s): TROPONINI in the last 168 hours. ------------------------------------------------------------------------------------------------------------------  RADIOLOGY:  No results found.  EKG:  EKG: there are no previous tracings available.  IMPRESSION AND PLAN:   58 y.o. male with pertinent past medical history of diabetes mellitus, DKA, carotid stenosis, iron deficiency anemia, CVA x3, hyperlipidemia, hypertension, tobacco abuse, CKD,  EtOH abuse, and pancreatitis presenting to the ED with chief complaints of diarrhea and dehydration x2 weeks.  1. Acute on chronic diarrhea: Unclear etiology with no recent risk factors: travel hx, sick contacts, dietary (undercooked, dairy, seafood), risky sexual exposures, recent antibiotics or chemotherapy - Admit to MedSurg unit - Check GI panel and C. Difficile - Metronidazole 52m PO tid x10+ days until diarrhea resolves  - May need Stool Culture (assessing for Salmonella, Shigella,  Campylobacter, E. Coli) pending above - ESR, CRP eval inflammatory bowel disease - PO and IV rehydration and electrolyte repletion - GI consult  2. AKI superimposed on CKD -BUN/creatinine elevated above baseline - Likely prerenal in the setting of dehydration from diuretic - IV fluids hydration - Hold nephrotoxins - Continue to monitor renal function  3. Diabetes mellitus - Hold Glimepiride - Continue Lantus - SSI  4. Hypertension - Hold lisinopril in the setting of AKI - Continue amlodipine and metoprolol  5. Hyperlipidemia - Continue pravastatin 20 mg nightly  6. EtOH abuse -no evidence of withdrawal - check UA and UDS - Daily Thiamine, Folate, MVI po - CIWA +/- Standing Protocol  7. GERD - Protonix  7. DVT prophylaxis -Heparin SubQ    All the records are reviewed and case discussed with ED provider. Management plans discussed with the patient, family and they are in agreement.  CODE STATUS: FULL  TOTAL TIME TAKING CARE OF THIS PATIENT: 50 minutes.    on 03/05/2019 at 1:34 AM  ERufina Falco DNP, FNP-BC [facility] Nurse Practitioner Between 7am to 6pm - Pager -[phone] After 6pm go to [email or web address] - password [email or web address]  Office  [phone] CC: Primary care physician; HTracie Harrier MD

## 2019-03-05 NOTE — Consult Note (Signed)
Reginald Lame, MD [facility]  [address], [place], [place] [address] Phone: [phone] Fax : [phone]  Consultation  Referring Provider:     Dr. Verdell Carmine Primary Care Physician:  [name], MD Primary Gastroenterologist: Althia Forts        Reason for Consultation:     Diarrhea  Date of Admission:  03/04/2019 Date of Consultation:  03/05/2019         HPI:   [name] is a 58 y.o. male who was admitted with diarrhea.  The patient had been having diarrhea for the last few months. The patient had an increase of his Cr. on admission thought to be due to dehydration. The patient says that was given a medication that was for the diarrhea but did not take it. The patient had reported 6-10 bowel movements a day to the hospitalist but then told me and the ER doc that he is having 2-3 dowel movement a day. He denies any black stools, abdominal pain and bright red blood per rectum. The patient reports that he has not seen a gastroenterologist for this in the past.  The patient was found to have C. Diff negative stools. The patient had a negative CRP. The lipase was 84. The Hb was 12.1 today.  Past Medical History:  Diagnosis Date  . Chronic kidney disease   . Diabetes mellitus without complication ([facility])   . ETOH abuse   . Hyperlipidemia   . Hypertension   . Stroke [facility])     Past Surgical History:  Procedure Laterality Date  . ESOPHAGOGASTRODUODENOSCOPY (EGD) WITH PROPOFOL N/A 07/01/2016   Procedure: ESOPHAGOGASTRODUODENOSCOPY (EGD) WITH PROPOFOL;  Surgeon: San Jetty, MD;  Location: [facility] ENDOSCOPY;  Service: General;  Laterality: N/A;    Prior to Admission medications   Medication Sig Start Date End Date Taking? Authorizing Provider  amLODipine (NORVASC) 10 MG tablet Take 1 tablet (10 mg total) by mouth daily. 07/27/18  Yes Mody, Ulice Bold, MD  gabapentin (NEURONTIN) 300 MG capsule Take 1 capsule (300 mg total) by mouth 2 (two) times daily. Patient taking differently:  Take 300 mg by mouth 3 (three) times daily.  07/27/18  Yes Mody, Sital, MD  glimepiride (AMARYL) 4 MG tablet Take 4 mg by mouth daily. 03/25/18  Yes [provider]  insulin glargine (LANTUS) 100 UNIT/ML injection Inject 0.15 mLs (15 Units total) into the skin daily. Patient taking differently: Inject 8 Units into the skin daily.  07/27/18  Yes Mody, Ulice Bold, MD  lisinopril (ZESTRIL) 30 MG tablet Take 30 mg by mouth daily. 02/12/19  Yes [provider]  loperamide (IMODIUM A-D) 2 MG tablet Take 2 mg by mouth 4 (four) times daily as needed for diarrhea or loose stools. 03/04/19 03/19/19 Yes [provider]  metoprolol tartrate (LOPRESSOR) 50 MG tablet Take 50 mg by mouth 2 (two) times daily. 02/16/19 02/16/20 Yes [provider]  metroNIDAZOLE (FLAGYL) 500 MG tablet Take 500 mg by mouth 3 (three) times daily. 03/04/19  Yes [provider]  pantoprazole (PROTONIX) 40 MG tablet Take 40 mg by mouth daily. 12/18/18  Yes [provider]  sertraline (ZOLOFT) 50 MG tablet Take 50 mg by mouth daily.  12/19/17  Yes [provider]  sodium bicarbonate 650 MG tablet Take 650 mg by mouth 2 (two) times daily. 03/01/19  Yes [provider]    Family History  Problem Relation Age of Onset  . CAD Brother   . Dementia Mother   . Renal  Disease Father      Social History   Tobacco Use  . Smoking status: Current Every Day Smoker    Packs/day: 0.25    Years: 5.00    Pack years: 1.25    Types: Cigars  . Smokeless tobacco: Never Used  Substance Use Topics  . Alcohol use: Yes    Alcohol/week: 16.0 standard drinks    Types: 2 Shots of liquor, 14 Cans of beer per week  . Drug use: No    Allergies as of 03/04/2019 - Review Complete 03/04/2019  Allergen Reaction Noted  . Ferrous gluconate Nausea And Vomiting 02/17/2014    Review of Systems:    All systems reviewed and negative except where noted in HPI.   Physical Exam:  Vital signs in last  24 hours: Temp:  [98 F (36.7 C)-98.5 F (36.9 C)] 98.3 F (36.8 C) (10/22 1949) Pulse Rate:  [71-76] 73 (10/22 1949) Resp:  [16-20] 17 (10/22 1949) BP: (150-163)/(79-91) 159/91 (10/22 1949) SpO2:  [96 %-100 %] 98 % (10/22 1949) Weight:  [66.9 kg] 66.9 kg (10/22 0045) Last BM Date: 03/05/19 General:   Pleasant, cooperative in NAD Head:  Normocephalic and atraumatic. Eyes:   No icterus.   Conjunctiva pink. PERRLA. Ears:  Normal auditory acuity. Neck:  Supple; no masses or thyroidomegaly Lungs: Respirations even and unlabored. Lungs clear to auscultation bilaterally.   No wheezes, crackles, or rhonchi.  Heart:  Regular rate and rhythm;  Without murmur, clicks, rubs or gallops Abdomen:  Soft, nondistended, nontender. Normal bowel sounds. No appreciable masses or hepatomegaly.  No rebound or guarding.  Rectal:  Not performed. Msk:  Symmetrical without gross deformities.   Extremities:  Without edema, cyanosis or clubbing. Neurologic:  Alert and oriented x3;  grossly normal neurologically. Skin:  Intact without significant lesions or rashes. Cervical Nodes:  No significant cervical adenopathy. Psych:  Alert and cooperative. Normal affect.  LAB RESULTS: Recent Labs    03/04/19 1808  WBC 5.2  HGB 12.1*  HCT 37.2*  PLT 176   BMET Recent Labs    03/04/19 1808 03/05/19 0405  NA 137 140  K 4.4 4.0  CL 113* 117*  CO2 16* 17*  GLUCOSE 313* 301*  BUN 44* 43*  CREATININE 5.01* 4.70*  CALCIUM 7.8* 7.2*   LFT Recent Labs    03/04/19 1808  PROT 7.0  ALBUMIN 2.9*  AST 21  ALT 17  ALKPHOS 78  BILITOT 0.6   PT/INR No results for input(s): LABPROT, INR in the last 72 hours.  STUDIES: No results found.    Impression / Plan:   Assessment: Active Problems:   AKI (acute kidney injury) ([facility])   [name] is a 58 y.o. y/o male with diarrhea and pre-renal azotemia.  The patient denies ever having problems with diarrhea prior to this.  He also denies any recent  colonoscopy.  He has no factors that he can secondary to his diarrhea.  Plan:  The patient has had unexplained diarrhea and will be set up a colonoscopy with biopsies tomorrow versus the diarrhea.  The patient has been explained the plan agrees with it. I have discussed risks & benefits which include, but are not limited to, bleeding, infection, perforation & drug reaction.  The patient agrees with this plan & written consent will be obtained.     Thank you for involving me in the care of this patient.      LOS: 1 day   Reginald Lame, MD  03/05/2019,  9:19 PM Pager [phone] 7am-5pm  Check AMION for 5pm -7am coverage and on weekends   Note: This dictation was prepared with Dragon dictation along with smaller phrase technology. Any transcriptional errors that result from this process are unintentional.

## 2019-03-05 NOTE — Progress Notes (Signed)
[place] at [place] NAME: [name]    MR#:  [id number]  DATE OF BIRTH:  [birth year]  SUBJECTIVE:   Patient presents to the hospital due to acute diarrhea and noted to be in acute on chronic renal failure.  Patient still having some diarrhea, no nausea or vomiting.  Afebrile.  Creatinine slightly improved since yesterday.  REVIEW OF SYSTEMS:    Review of Systems  Constitutional: Negative for chills and fever.  HENT: Negative for congestion and tinnitus.   Eyes: Negative for blurred vision and double vision.  Respiratory: Negative for cough, shortness of breath and wheezing.   Cardiovascular: Negative for chest pain, orthopnea and PND.  Gastrointestinal: Positive for diarrhea. Negative for abdominal pain, nausea and vomiting.  Genitourinary: Negative for dysuria and hematuria.  Neurological: Negative for dizziness, sensory change and focal weakness.  All other systems reviewed and are negative.   Nutrition: Carb control Tolerating Diet: Yes Tolerating PT: Await Eval.    DRUG ALLERGIES:   Allergies  Allergen Reactions  . Ferrous Gluconate Nausea And Vomiting    VITALS:  Blood pressure (!) 150/79, pulse 71, temperature 98.2 F (36.8 C), temperature source Oral, resp. rate 16, height 5\' 11"  (1.803 m), weight 66.9 kg, SpO2 100 %.  PHYSICAL EXAMINATION:   Physical Exam  GENERAL:  58 y.o.-year-old patient lying in bed in no acute distress.  EYES: Pupils equal, round, reactive to light and accommodation. No scleral icterus. Extraocular muscles intact.  HEENT: Head atraumatic, normocephalic. Oropharynx and nasopharynx clear.  NECK:  Supple, no jugular venous distention. No thyroid enlargement, no tenderness.  LUNGS: Normal breath sounds bilaterally, no wheezing, rales, rhonchi. No use of accessory muscles of respiration.  CARDIOVASCULAR: S1, S2 normal. No murmurs, rubs, or gallops.  ABDOMEN: Soft, nontender, nondistended.  Bowel sounds present. No organomegaly or mass.  EXTREMITIES: No cyanosis, clubbing or edema b/l.    NEUROLOGIC: Cranial nerves II through XII are intact. No focal Motor or sensory deficits b/l.  Globally weak.  PSYCHIATRIC: The patient is alert and oriented x 3.  SKIN: No obvious rash, lesion, or ulcer.    LABORATORY PANEL:   CBC Recent Labs  Lab 03/04/19 1808  WBC 5.2  HGB 12.1*  HCT 37.2*  PLT 176   ------------------------------------------------------------------------------------------------------------------  Chemistries  Recent Labs  Lab 03/04/19 1808 03/05/19 0405  NA 137 140  K 4.4 4.0  CL 113* 117*  CO2 16* 17*  GLUCOSE 313* 301*  BUN 44* 43*  CREATININE 5.01* 4.70*  CALCIUM 7.8* 7.2*  AST 21  --   ALT 17  --   ALKPHOS 78  --   BILITOT 0.6  --    ------------------------------------------------------------------------------------------------------------------  Cardiac Enzymes No results for input(s): TROPONINI in the last 168 hours. ------------------------------------------------------------------------------------------------------------------  RADIOLOGY:  No results found.   ASSESSMENT AND PLAN:   58 year old male with past medical history of alcohol abuse, hypertension, hyperlipidemia, diabetes, CKD stage IV, history of previous CVA who presented to the hospital due to diarrhea and noted to be in acute on chronic renal failure.  1.  Acute diarrhea-etiology unclear presently. -Await stool for comprehensive culture and C. difficile. -Patient has no fever, abdominal pain nausea or vomiting.  Will resume some Imodium if C. difficile is negative.  Await further GI input.  2.  Acute on chronic renal failure-patient presented to the hospital with a creatinine over 5.  Patient's baseline creatinines around 3.0. -Continue IV fluid hydration, creatinine is trending  down.  Renal failure is likely secondary to underlying diarrhea and volume loss. -Renal  dose meds, avoid nephrotoxins.  3.  Neuropathy-continue gabapentin.  4.  Diabetes type 2 with polyneuropathy-continue Lantus, sliding scale insulin, follow blood sugars.  5.  Hyperlipidemia-continue Pravachol.  6.  GERD-continue Protonix.  7.  Depression-continue Zoloft.  8.  Essential hypertension-continue metoprolol.   All the records are reviewed and case discussed with Care Management/Social Worker. Management plans discussed with the patient, family and they are in agreement.  CODE STATUS: Full code  DVT Prophylaxis: Hep. SQ  TOTAL TIME TAKING CARE OF THIS PATIENT: 35 minutes.   POSSIBLE D/C IN 2-3 DAYS, DEPENDING ON CLINICAL CONDITION.   Henreitta Leber M.D on 03/05/2019 at 2:33 PM  Between 7am to 6pm - Pager - [phone]  After 6pm go to [email or web address] - Proofreader  [facility]  Office  [phone]  CC: Primary care physician; [name], MD

## 2019-03-05 NOTE — Progress Notes (Signed)
Initial Nutrition Assessment  DOCUMENTATION CODES:   Not applicable  INTERVENTION:   Nepro Shake po BID, each supplement provides 425 kcal and 19 grams protein  MVI daily   Recommend thiamine and folic acid daily in setting of etoh abuse   Pt likely at high refeed risk; recommend monitor K, Mg and P labs daily   NUTRITION DIAGNOSIS:   Inadequate oral intake related to acute illness as evidenced by per patient/family report.  GOAL:   Patient will meet greater than or equal to 90% of their needs  MONITOR:   PO intake, Supplement acceptance, Labs, Weight trends, Skin, I & O's  REASON FOR ASSESSMENT:   Malnutrition Screening Tool    ASSESSMENT:   58 y.o. male with pertinent past medical history of diabetes mellitus, DKA, carotid stenosis, iron deficiency anemia, CVA x3, hyperlipidemia, hypertension, tobacco abuse, CKD, EtOH abuse, and pancreatitis presenting to the ED with chief complaints of diarrhea and dehydration x2 weeks.  Spoke with patient via phone. Patient is known to this RD from previous admits. Pt reports decreased appetite and oral intake for 1 week pta. Pt reports that his appetite is improving today; pt ate eggs and pancakes for breakfast this morning. Pt enjoys Nepro supplements and would like to have these while in the hospital. RD will order supplements and MVI to help pt meet his estimated needs. RD will also liberalize the heart healthy portion of pt's diet as this is restrictive of protein. Pt previously diagnosed with severe malnutrition but unable to diagnose at this time as NFPE cannot be performed. Pt reports continued diarrhea today.  Per chart, pt with ~20lb weight gain since he was last seen by this RD in 02/2018. Pt is still down from his UBW which he reports is 170lbs.   Medications reviewed and include: heparin, insulin, metronidazole, protonix, NaCl @125ml /hr  Labs reviewed: BUN 43(H), creat 4.70(H) cbgs- 277, 230, 233 x 24 hrs  Unable to  complete Nutrition-Focused physical exam at this time as pt on contact precautions.    Diet Order:   Diet Order            Diet heart healthy/carb modified Room service appropriate? Yes; Fluid consistency: Thin  Diet effective now             EDUCATION NEEDS:   Education needs have been addressed  Skin:  Skin Assessment: Reviewed RN Assessment  Last BM:  10/22- type 7  Height:   Ht Readings from Last 1 Encounters:  03/05/19 5\' 11"  (1.803 m)    Weight:   Wt Readings from Last 1 Encounters:  03/05/19 66.9 kg    Ideal Body Weight:  78.2 kg  BMI:  Body mass index is 20.56 kg/m.  Estimated Nutritional Needs:   Kcal:  1900-2200kcal/day  Protein:  95-110g/day  Fluid:  >2L/day  Koleen Distance MS, RD, LDN Pager #- [phone] Office#- [phone] After Hours Pager: [phone]

## 2019-03-06 ENCOUNTER — Inpatient Hospital Stay: Payer: Medicare Other | Admitting: Anesthesiology

## 2019-03-06 ENCOUNTER — Encounter: Payer: Self-pay | Admitting: *Deleted

## 2019-03-06 ENCOUNTER — Encounter
Admission: EM | Disposition: A | Discharge status: Home Health | Payer: Self-pay | Source: Home / Self Care | Attending: Specialist

## 2019-03-06 DIAGNOSIS — K635 Polyp of colon: Secondary | ICD-10-CM

## 2019-03-06 HISTORY — PX: COLONOSCOPY WITH PROPOFOL: SHX5780

## 2019-03-06 LAB — BASIC METABOLIC PANEL
Anion gap: 5 (ref 5–15)
BUN: 40 mg/dL — ABNORMAL HIGH (ref 6–20)
CO2: 16 mmol/L — ABNORMAL LOW (ref 22–32)
Calcium: 7.4 mg/dL — ABNORMAL LOW (ref 8.9–10.3)
Chloride: 122 mmol/L — ABNORMAL HIGH (ref 98–111)
Creatinine, Ser: 4.57 mg/dL — ABNORMAL HIGH (ref 0.61–1.24)
GFR calc Af Amer: 15 mL/min — ABNORMAL LOW (ref >60)
GFR calc non Af Amer: 13 mL/min — ABNORMAL LOW (ref >60)
Glucose, Bld: 91 mg/dL (ref 70–99)
Potassium: 4 mmol/L (ref 3.5–5.1)
Sodium: 143 mmol/L (ref 135–145)

## 2019-03-06 LAB — GLUCOSE, CAPILLARY
Glucose-Capillary: 111 mg/dL — ABNORMAL HIGH (ref 70–99)
Glucose-Capillary: 89 mg/dL (ref 70–99)
Glucose-Capillary: 241 mg/dL — ABNORMAL HIGH (ref 70–99)

## 2019-03-06 SURGERY — COLONOSCOPY WITH PROPOFOL
Anesthesia: General

## 2019-03-06 MED ORDER — PHENYLEPHRINE HCL (PRESSORS) 10 MG/ML IV SOLN
INTRAVENOUS | Status: DC | PRN
  Administered 2019-03-06: 100 ug via INTRAVENOUS

## 2019-03-06 MED ORDER — PROPOFOL 500 MG/50ML IV EMUL
INTRAVENOUS | Status: DC | PRN
  Administered 2019-03-06: 140 ug/kg/min via INTRAVENOUS

## 2019-03-06 NOTE — Anesthesia Post-op Follow-up Note (Signed)
Anesthesia QCDR form completed.        

## 2019-03-06 NOTE — Anesthesia Preprocedure Evaluation (Signed)
Anesthesia Evaluation  Patient identified by MRN, date of birth, ID band Patient awake    Reviewed: Allergy & Precautions, H&P , NPO status , Patient's Chart, lab work & pertinent test results  History of Anesthesia Complications Negative for: history of anesthetic complications  Airway Mallampati: III  TM Distance: >3 FB Neck ROM: limited    Dental  (+) Chipped, Poor Dentition, Missing   Pulmonary neg shortness of breath, Current Smoker,           Cardiovascular hypertension, (-) angina(-) Past MI and (-) DOE      Neuro/Psych PSYCHIATRIC DISORDERS CVA (right sided), Residual Symptoms    GI/Hepatic Neg liver ROS, GERD  Medicated and Controlled,  Endo/Other  diabetes, Type 2  Renal/GU Renal disease  negative genitourinary   Musculoskeletal   Abdominal   Peds  Hematology negative hematology ROS (+)   Anesthesia Other Findings Past Medical History: No date: Chronic kidney disease No date: Diabetes mellitus without complication ([facility]) No date: ETOH abuse No date: Hyperlipidemia No date: Hypertension No date: Stroke [facility])  Past Surgical History: 07/01/2016: ESOPHAGOGASTRODUODENOSCOPY (EGD) WITH PROPOFOL; N/A     Comment:  Procedure: ESOPHAGOGASTRODUODENOSCOPY (EGD) WITH               PROPOFOL;  Surgeon: San Jetty, MD;  Location: [facility]               ENDOSCOPY;  Service: General;  Laterality: N/A;  BMI    Body Mass Index: 20.56 kg/m      Reproductive/Obstetrics negative OB ROS                             Anesthesia Physical Anesthesia Plan  ASA: III  Anesthesia Plan: General   Post-op Pain Management:    Induction: Intravenous  PONV Risk Score and Plan: Propofol infusion and TIVA  Airway Management Planned: Natural Airway and Nasal Cannula  Additional Equipment:   Intra-op Plan:   Post-operative Plan:   Informed Consent: I have reviewed the patients History and  Physical, chart, labs and discussed the procedure including the risks, benefits and alternatives for the proposed anesthesia with the patient or authorized representative who has indicated his/her understanding and acceptance.     Dental Advisory Given  Plan Discussed with: Anesthesiologist, CRNA and Surgeon  Anesthesia Plan Comments: (Patient consented for risks of anesthesia including but not limited to:  - adverse reactions to medications - risk of intubation if required - damage to teeth, lips or other oral mucosa - sore throat or hoarseness - Damage to heart, brain, lungs or loss of life  Patient voiced understanding.)        Anesthesia Quick Evaluation

## 2019-03-06 NOTE — Progress Notes (Signed)
[place] at [place] NAME: [name]    MR#:  [id number]  DATE OF BIRTH:  09-29-1960  SUBJECTIVE:   Diarrhea has improved since yesterday.  Patient had colonoscopy which showed internal hemorrhoids and multiple polyps which were removed.  Biopsies taken.  Renal function slightly improved since yesterday but not close to baseline.  REVIEW OF SYSTEMS:    Review of Systems  Constitutional: Negative for chills and fever.  HENT: Negative for congestion and tinnitus.   Eyes: Negative for blurred vision and double vision.  Respiratory: Negative for cough, shortness of breath and wheezing.   Cardiovascular: Negative for chest pain, orthopnea and PND.  Gastrointestinal: Positive for diarrhea. Negative for abdominal pain, nausea and vomiting.  Genitourinary: Negative for dysuria and hematuria.  Neurological: Negative for dizziness, sensory change and focal weakness.  All other systems reviewed and are negative.   Nutrition: Carb control Tolerating Diet: Yes Tolerating PT: Await Eval.    DRUG ALLERGIES:   Allergies  Allergen Reactions  . Ferrous Gluconate Nausea And Vomiting    VITALS:  Blood pressure (!) 150/86, pulse 67, temperature (!) 97.4 F (36.3 C), temperature source Tympanic, resp. rate 17, height 5\' 11"  (1.803 m), weight 66.9 kg, SpO2 97 %.  PHYSICAL EXAMINATION:   Physical Exam  GENERAL:  58 y.o.-year-old patient lying in bed in no acute distress.  EYES: Pupils equal, round, reactive to light and accommodation. No scleral icterus. Extraocular muscles intact.  HEENT: Head atraumatic, normocephalic. Oropharynx and nasopharynx clear.  NECK:  Supple, no jugular venous distention. No thyroid enlargement, no tenderness.  LUNGS: Normal breath sounds bilaterally, no wheezing, rales, rhonchi. No use of accessory muscles of respiration.  CARDIOVASCULAR: S1, S2 normal. No murmurs, rubs, or gallops.  ABDOMEN: Soft, nontender,  nondistended. Bowel sounds present. No organomegaly or mass.  EXTREMITIES: No cyanosis, clubbing or edema b/l.    NEUROLOGIC: Cranial nerves II through XII are intact. No focal Motor or sensory deficits b/l.  Globally weak.  PSYCHIATRIC: The patient is alert and oriented x 3.  SKIN: No obvious rash, lesion, or ulcer.    LABORATORY PANEL:   CBC Recent Labs  Lab 03/04/19 1808  WBC 5.2  HGB 12.1*  HCT 37.2*  PLT 176   ------------------------------------------------------------------------------------------------------------------  Chemistries  Recent Labs  Lab 03/04/19 1808  03/06/19 0908  NA 137   < > 143  K 4.4   < > 4.0  CL 113*   < > 122*  CO2 16*   < > 16*  GLUCOSE 313*   < > 91  BUN 44*   < > 40*  CREATININE 5.01*   < > 4.57*  CALCIUM 7.8*   < > 7.4*  AST 21  --   --   ALT 17  --   --   ALKPHOS 78  --   --   BILITOT 0.6  --   --    < > = values in this interval not displayed.   ------------------------------------------------------------------------------------------------------------------  Cardiac Enzymes No results for input(s): TROPONINI in the last 168 hours. ------------------------------------------------------------------------------------------------------------------  RADIOLOGY:  No results found.   ASSESSMENT AND PLAN:   58 year old male with past medical history of alcohol abuse, hypertension, hyperlipidemia, diabetes, CKD stage IV, history of previous CVA who presented to the hospital due to diarrhea and noted to be in acute on chronic renal failure.  1.  Acute diarrhea-etiology unclear presently. -Stool for C. difficile was negative, comprehensive culture  still pending.  Status post colonoscopy today which showed internal hemorrhoids and biopsies taken but results pending.  Multiple polyps were removed. -Continue as needed Imodium.  Clinically patient is improving.  Tolerating p.o. well.  2.  Acute on chronic renal failure-patient presented  to the hospital with a creatinine over 5.  Patient's baseline creatinines around 3.0. -Patient's creatinine down to 4.5 today.  Continue IV fluids will follow BUN/creatinine urine output.  Renal dose meds, avoid nephrotoxins.  3.  Neuropathy-continue gabapentin.  4.  Diabetes type 2 with polyneuropathy-continue Lantus, sliding scale insulin. BS stable.   5.  Hyperlipidemia-continue Pravachol.  6.  GERD-continue Protonix.  7.  Depression-continue Zoloft.  8.  Essential hypertension-continue metoprolol.   All the records are reviewed and case discussed with Care Management/Social Worker. Management plans discussed with the patient, family and they are in agreement.  CODE STATUS: Full code  DVT Prophylaxis: Hep. SQ  TOTAL TIME TAKING CARE OF THIS PATIENT: 30 minutes.   POSSIBLE D/C IN 2-3 DAYS, DEPENDING ON CLINICAL CONDITION.   Henreitta Leber M.D on 03/06/2019 at 2:57 PM  Between 7am to 6pm - Pager - [phone]  After 6pm go to [email or web address] - Proofreader  [facility]  Office  [phone]  CC: Primary care physician; [name], MD

## 2019-03-06 NOTE — Transfer of Care (Signed)
Immediate Anesthesia Transfer of Care Note  [name]  Procedure(s) Performed: COLONOSCOPY WITH PROPOFOL (N/A )  Patient Location: PACU  Anesthesia Type:General  Level of Consciousness: sedated  Airway & Oxygen Therapy: Patient Spontanous Breathing  Post-op Assessment: Report given to RN and Post -op Vital signs reviewed and stable  Post vital signs: Reviewed and stable  Last Vitals:  Vitals Value Taken Time  BP 107/65 03/06/19 1409  Temp    Pulse 65 03/06/19 1409  Resp 17 03/06/19 1409  SpO2 97 % 03/06/19 1409  Vitals shown include unvalidated device data.  Last Pain:  Vitals:   03/06/19 1405  TempSrc:   PainSc: Asleep         Complications: No apparent anesthesia complications

## 2019-03-06 NOTE — Op Note (Signed)
[facility] Gastroenterology Patient Name: [name] Procedure Date: 03/06/2019 1:31 PM MRN: [id number] Account #: [id number] Date of Birth: 1960/10/24 Admit Type: Inpatient Age: 58 Room: [facility] ENDO ROOM 4 Gender: Male Note Status: Finalized Procedure:            Colonoscopy Indications:          Chronic diarrhea Providers:            Lucilla Lame MD, MD Referring MD:         [name], MD (Referring MD) Medicines:            Propofol per Anesthesia Complications:        No immediate complications. Procedure:            Pre-Anesthesia Assessment:                       - Prior to the procedure, a History and Physical was                        performed, and patient medications and allergies were                        reviewed. The patient's tolerance of previous                        anesthesia was also reviewed. The risks and benefits of                        the procedure and the sedation options and risks were                        discussed with the patient. All questions were                        answered, and informed consent was obtained. Prior                        Anticoagulants: The patient has taken no previous                        anticoagulant or antiplatelet agents. ASA Grade                        Assessment: II - A patient with mild systemic disease.                        After reviewing the risks and benefits, the patient was                        deemed in satisfactory condition to undergo the                        procedure.                       After obtaining informed consent, the colonoscope was                        passed under direct vision. Throughout the procedure,  the patient's blood pressure, pulse, and oxygen                        saturations were monitored continuously. The                        Colonoscope was introduced through the anus and                        advanced to the the  cecum, identified by appendiceal                        orifice and ileocecal valve. The colonoscopy was                        performed without difficulty. The patient tolerated the                        procedure well. The quality of the bowel preparation                        was fair. Findings:      The perianal and digital rectal examinations were normal.      A 5 mm polyp was found in the descending colon. The polyp was sessile.       The polyp was removed with a cold snare. Resection and retrieval were       complete.      A 4 mm polyp was found in the ascending colon. The polyp was sessile.       The polyp was removed with a cold biopsy forceps. Resection and       retrieval were complete.      Three sessile polyps were found in the ascending colon. The polyps were       3 to 6 mm in size. These polyps were removed with a cold snare.       Resection and retrieval were complete.      A 4 mm polyp was found in the transverse colon. The polyp was sessile.       The polyp was removed with a cold snare. Resection and retrieval were       complete.      Random biopsies were obtained with cold forceps for histology randomly       in the entire colon.      Non-bleeding internal hemorrhoids were found during retroflexion. The       hemorrhoids were Grade II (internal hemorrhoids that prolapse but reduce       spontaneously). Impression:           - Preparation of the colon was fair.                       - One 5 mm polyp in the descending colon, removed with                        a cold snare. Resected and retrieved.                       - One 4 mm polyp in the ascending colon, removed with a  cold biopsy forceps. Resected and retrieved.                       - Three 3 to 6 mm polyps in the ascending colon,                        removed with a cold snare. Resected and retrieved.                       - One 4 mm polyp in the transverse colon, removed with                         a cold snare. Resected and retrieved.                       - Non-bleeding internal hemorrhoids.                       - Random biopsies were obtained in the entire colon. Recommendation:       - Return patient to hospital ward for ongoing care.                       - Resume previous diet.                       - Continue present medications.                       - Await pathology results. Procedure Code(s):    --- Professional ---                       [phone], Colonoscopy, flexible; with removal of tumor(s),                        polyp(s), or other lesion(s) by snare technique                       45380, 20, Colonoscopy, flexible; with biopsy, single                        or multiple Diagnosis Code(s):    --- Professional ---                       K52.9, Noninfective gastroenteritis and colitis,                        unspecified                       K63.5, Polyp of colon CPT copyright 2019 American Medical Association. All rights reserved. The codes documented in this report are preliminary and upon coder review may  be revised to meet current compliance requirements. Lucilla Lame MD, MD 03/06/2019 2:02:58 PM This report has been signed electronically. Number of Addenda: 0 Note Initiated On: 03/06/2019 1:31 PM Scope Withdrawal Time: 0 hours 14 minutes 14 seconds  Total Procedure Duration: 0 hours 20 minutes 51 seconds  Estimated Blood Loss: Estimated blood loss: none.      [facility]

## 2019-03-07 ENCOUNTER — Inpatient Hospital Stay: Payer: Medicare Other

## 2019-03-07 LAB — BASIC METABOLIC PANEL
Anion gap: 6 (ref 5–15)
BUN: 35 mg/dL — ABNORMAL HIGH (ref 6–20)
CO2: 15 mmol/L — ABNORMAL LOW (ref 22–32)
Calcium: 7.6 mg/dL — ABNORMAL LOW (ref 8.9–10.3)
Chloride: 121 mmol/L — ABNORMAL HIGH (ref 98–111)
Creatinine, Ser: 4.38 mg/dL — ABNORMAL HIGH (ref 0.61–1.24)
GFR calc Af Amer: 16 mL/min — ABNORMAL LOW (ref >60)
GFR calc non Af Amer: 14 mL/min — ABNORMAL LOW (ref >60)
Glucose, Bld: 195 mg/dL — ABNORMAL HIGH (ref 70–99)
Potassium: 4.3 mmol/L (ref 3.5–5.1)
Sodium: 142 mmol/L (ref 135–145)

## 2019-03-07 LAB — GLUCOSE, CAPILLARY
Glucose-Capillary: 142 mg/dL — ABNORMAL HIGH (ref 70–99)
Glucose-Capillary: 179 mg/dL — ABNORMAL HIGH (ref 70–99)

## 2019-03-07 NOTE — TOC Transition Note (Signed)
Transition of Care [facility]) - CM/SW Discharge Note   Patient Details  Name: [name] MRN: [id number] Date of Birth: 17-Apr-1961  Transition of Care [facility]) CM/SW Contact:  Latanya Maudlin, RN Phone Number: 03/07/2019, 11:00 AM   Clinical Narrative:  Patient to be discharged per MD order. Orders in place for home health services. Patient in independent at baseline, does admit to having weakness on admission but has improved and is ambulating well. Patient still drives and does not meet homebound criteria. Patient has no requests for follow up care other than seeing his PCP.      Final next level of care: Home/Self Care Barriers to Discharge: No Barriers Identified   Patient Goals and CMS Choice   CMS Medicare.gov Compare Post Acute Care list provided to:: Patient Choice offered to / list presented to : Patient  Discharge Placement                       Discharge Plan and Services     Post Acute Care Choice: Home Health                    HH Arranged: [facility]          Social Determinants of Health (SDOH) Interventions     Readmission Risk Interventions No flowsheet data found.

## 2019-03-07 NOTE — Evaluation (Signed)
Physical Therapy Evaluation Patient Details Name: [name] MRN: [id number] DOB: 06/07/1960 Today's Date: 03/07/2019   History of Present Illness  pt is a 58 yo male who presented to ED with complaints of diarrhea since september that has progessively worsened over last week, colonoscopy 10/23 with biopsies taken and multiple polyps removed. PMH includes stroke with residual right sided weakness, CKD, DM, ETOH abuse, HLD, HTN  Clinical Impression  Pt is a 58 yo male admitted for above. Pt in bed upon arrival and reluctantly agreed to participate with PT. Pt denies pain and reports he is at baseline level of functioning and is ready to go home. Pt denies any falls at home and reports recently having his bathroom renovated to be more accessible. Pt presents with right sided weakness residual from a past stroke, mild balance deficits and decreased activity tolerance. Pt performed functional mobility mod I and min guard/supervision assist. Pt reports having outpatient PT in the past but did not feel like it was of any benefit to him. Pt performed standing LE therex at sink requiring VC/TC and frequent encouragement for correct performance. Pt reports having no concerns about returning home safely. Pt is currently at baseline level of functioning and is safe to return home.     Follow Up Recommendations No PT follow up    Equipment Recommendations  None recommended by PT    Recommendations for Other Services       Precautions / Restrictions Precautions Precautions: Fall Restrictions Weight Bearing Restrictions: No      Mobility  Bed Mobility Overal bed mobility: Modified Independent             General bed mobility comments: pt able to get EOB without physical assist however utilized bed rails and HOB elevated  Transfers Overall transfer level: Needs assistance Equipment used: Rolling walker (2 wheeled) Transfers: Sit to/from Stand Sit to Stand: Min guard         General  transfer comment: pt able to rise from bed without physical assist utilizing increased time and effort, pt required VC for hand placement, RW used today but pt reports using SPC at cane, pt steady with initial standing  Ambulation/Gait Ambulation/Gait assistance: Supervision Gait Distance (Feet): 100 Feet Assistive device: Rolling walker (2 wheeled) Gait Pattern/deviations: Decreased stride length;Trunk flexed Gait velocity: decreased   General Gait Details: pt ambulated with RW today with progress to ambulating with SPC next session, pt reported ambulating at his baseline level, pt steady with no instances of buckling, or LOB,  Stairs            Wheelchair Mobility    Modified Rankin (Stroke Patients Only)       Balance Overall balance assessment: Mild deficits observed, not formally tested                                           Pertinent Vitals/Pain Pain Assessment: No/denies pain    Home Living Family/patient expects to be discharged to:: Private residence Living Arrangements: Spouse/significant other Available Help at Discharge: Family;Available PRN/intermittently Type of Home: House Home Access: Level entry     Home Layout: One level Home Equipment: Grab bars - tub/shower;Grab bars - toilet;Cane - single point      Prior Function Level of Independence: Independent with assistive device(s)         Comments: pt reports independent with all ADLS/IADLs,  reports using a SPC for ambulation     Hand Dominance        Extremity/Trunk Assessment   Upper Extremity Assessment Upper Extremity Assessment: Overall WFL for tasks assessed(LUE strength grossly 4/5, RUE strength grossly 3/5, grip strength RUE 4/5, grip strength LUE 5//5)    Lower Extremity Assessment Lower Extremity Assessment: Overall WFL for tasks assessed(LLE strength grossly 4-/5, RLE strength grossly 3+/5)    Cervical / Trunk Assessment Cervical / Trunk Assessment:  Kyphotic  Communication   Communication: No difficulties  Cognition Arousal/Alertness: Awake/alert Behavior During Therapy: Agitated Overall Cognitive Status: Within Functional Limits for tasks assessed                                        General Comments General comments (skin integrity, edema, etc.): pt able to maintain balance with feet together in NBOS without UE support ~30 sec, pt able to maintain semi tandem stance with each foot in front and no UE support ~5 sec with increased sway, pt steady with sitting balance    Exercises Total Joint Exercises Hip ABduction/ADduction: AROM;Both;10 reps;Standing Standing Hip Extension: AROM;Both;10 reps;Standing General Exercises - Lower Extremity Toe Raises: AROM;Both;10 reps;Standing Heel Raises: AROM;Both;10 reps;Standing   Assessment/Plan    PT Assessment Patient needs continued PT services  PT Problem List Decreased strength;Decreased mobility;Decreased safety awareness;Decreased activity tolerance;Decreased balance;Decreased knowledge of use of DME       PT Treatment Interventions DME instruction;Therapeutic exercise;Gait training;Balance training;Stair training;Neuromuscular re-education;Functional mobility training;Therapeutic activities;Patient/family education    PT Goals (Current goals can be found in the Care Plan section)  Acute Rehab PT Goals Patient Stated Goal: go home PT Goal Formulation: With patient Time For Goal Achievement: 03/21/19 Potential to Achieve Goals: Good    Frequency Min 2X/week   Barriers to discharge Decreased caregiver support      Co-evaluation               AM-PAC PT "6 Clicks" Mobility  Outcome Measure Help needed turning from your back to your side while in a flat bed without using bedrails?: A Little Help needed moving from lying on your back to sitting on the side of a flat bed without using bedrails?: A Little Help needed moving to and from a bed to a chair  (including a wheelchair)?: A Little Help needed standing up from a chair using your arms (e.g., wheelchair or bedside chair)?: A Little Help needed to walk in hospital room?: A Little Help needed climbing 3-5 steps with a railing? : A Little 6 Click Score: 18    End of Session Equipment Utilized During Treatment: Gait belt Activity Tolerance: Patient tolerated treatment well Patient left: in bed;with call bell/phone within reach Nurse Communication: Mobility status PT Visit Diagnosis: Muscle weakness (generalized) (M62.81);Other abnormalities of gait and mobility (R26.89)    Time: [id number] PT Time Calculation (min) (ACUTE ONLY): 23 min   Charges:   PT Evaluation $PT Eval Low Complexity: 1 Low         Sherald Balbuena PT, DPT 11:25 AM,03/07/19 [phone]

## 2019-03-07 NOTE — Discharge Summary (Signed)
[facility] [place] NAME: [name]    MR#:  [id number]  DATE OF BIRTH:  30-Oct-1960  DATE OF ADMISSION:  03/04/2019 ADMITTING PHYSICIAN: Lang Snow, NP  DATE OF DISCHARGE: 04/07/2019  PRIMARY CARE PHYSICIAN: [name], MD    ADMISSION DIAGNOSIS:  Diarrhea, unspecified type [R19.7] Acute renal failure superimposed on chronic kidney disease, unspecified CKD stage, unspecified acute renal failure type ([place]) [N17.9, N18.9]  DISCHARGE DIAGNOSIS:  Active Problems:   AKI (acute kidney injury) ([place])   Diarrhea   Polyp of ascending colon   SECONDARY DIAGNOSIS:   Past Medical History:  Diagnosis Date  . Chronic kidney disease   . Diabetes mellitus without complication ([place])   . ETOH abuse   . Hyperlipidemia   . Hypertension   . Stroke [facility])     58 year old male with past medical history of alcohol abuse, hypertension, hyperlipidemia, diabetes, CKD stage IV, history of previous CVA who presented to the hospital due to diarrhea and noted to be in acute on chronic renal failure.  1.  Acute diarrhea ; This has resolved. Stool for C. difficile was negative. He is status post colonoscopywhich showed internal hemorrhoids and biopsies taken but results pending.  Multiple polyps were removed. He has tolerated his diet well  2.  Acute on chronic renal failure stage 4: Patient's creatinine has stabilized.  Acute kidney injury due to dehydration and poor p.o. intake.  He will need outpatient follow-up.  He will need to continue to hold nephrotoxic medications.  Renal ultrasound showed no evidence of hydronephrosis..  3.  Neuropathy: He will continue gabapentin.  4.  Diabetes type 2 with polyneuropathy: He will continue Lantus ADA diet.  Oral medications are on hold due to acute kidney injury.  5.  Hyperlipidemia: Continue Pravachol.  6  Depression-continue Zoloft.  7  Essential hypertension : He will continue  metoprolol.  DISCHARGE CONDITIONS AND DIET:   Stable for discharge renal diabetic diet  CONSULTS OBTAINED:  Treatment Team:  Lucilla Lame, MD  DRUG ALLERGIES:   Allergies  Allergen Reactions  . Ferrous Gluconate Nausea And Vomiting    DISCHARGE MEDICATIONS:   Allergies as of 03/07/2019      Reactions   Ferrous Gluconate Nausea And Vomiting      Medication List    STOP taking these medications   glimepiride 4 MG tablet Commonly known as: AMARYL   lisinopril 30 MG tablet Commonly known as: ZESTRIL     TAKE these medications   amLODipine 10 MG tablet Commonly known as: NORVASC Take 1 tablet (10 mg total) by mouth daily.   gabapentin 300 MG capsule Commonly known as: NEURONTIN Take 1 capsule (300 mg total) by mouth 2 (two) times daily. What changed: when to take this   insulin glargine 100 UNIT/ML injection Commonly known as: LANTUS Inject 0.15 mLs (15 Units total) into the skin daily. What changed: how much to take   loperamide 2 MG tablet Commonly known as: IMODIUM A-D Take 2 mg by mouth 4 (four) times daily as needed for diarrhea or loose stools.   metoprolol tartrate 50 MG tablet Commonly known as: LOPRESSOR Take 50 mg by mouth 2 (two) times daily.   metroNIDAZOLE 500 MG tablet Commonly known as: FLAGYL Take 500 mg by mouth 3 (three) times daily.   pantoprazole 40 MG tablet Commonly known as: PROTONIX Take 40 mg by mouth daily.   sertraline 50 MG tablet Commonly known as: ZOLOFT Take  50 mg by mouth daily.   sodium bicarbonate 650 MG tablet Take 650 mg by mouth 2 (two) times daily.         Today   CHIEF COMPLAINT:  No acute issues overnight.  Patient wants to go home.   VITAL SIGNS:  Blood pressure (!) 185/98, pulse 79, temperature 98.2 F (36.8 C), temperature source Oral, resp. rate 16, height 5\' 11"  (1.803 m), weight 66.9 kg, SpO2 98 %.   REVIEW OF SYSTEMS:  Review of Systems  Constitutional: Negative.  Negative for chills,  fever and malaise/fatigue.  HENT: Negative.  Negative for ear discharge, ear pain, hearing loss, nosebleeds and sore throat.   Eyes: Negative.  Negative for blurred vision and pain.  Respiratory: Negative.  Negative for cough, hemoptysis, shortness of breath and wheezing.   Cardiovascular: Negative.  Negative for chest pain, palpitations and leg swelling.  Gastrointestinal: Negative.  Negative for abdominal pain, blood in stool, diarrhea, nausea and vomiting.  Genitourinary: Negative.  Negative for dysuria.  Musculoskeletal: Negative.  Negative for back pain.  Skin: Negative.   Neurological: Negative for dizziness, tremors, speech change, focal weakness, seizures and headaches.  Endo/Heme/Allergies: Negative.  Does not bruise/bleed easily.  Psychiatric/Behavioral: Negative.  Negative for depression, hallucinations and suicidal ideas.     PHYSICAL EXAMINATION:  GENERAL:  58 y.o.-year-old patient lying in the bed with no acute distress.  NECK:  Supple, no jugular venous distention. No thyroid enlargement, no tenderness.  LUNGS: Normal breath sounds bilaterally, no wheezing, rales,rhonchi  No use of accessory muscles of respiration.  CARDIOVASCULAR: S1, S2 normal. No murmurs, rubs, or gallops.  ABDOMEN: Soft, non-tender, non-distended. Bowel sounds present. No organomegaly or mass.  EXTREMITIES: No pedal edema, cyanosis, or clubbing.  PSYCHIATRIC: The patient is alert and oriented x 3.  SKIN: No obvious rash, lesion, or ulcer.   DATA REVIEW:   CBC Recent Labs  Lab 03/04/19 1808  WBC 5.2  HGB 12.1*  HCT 37.2*  PLT 176    Chemistries  Recent Labs  Lab 03/04/19 1808  03/07/19 0547  NA 137   < > 142  K 4.4   < > 4.3  CL 113*   < > 121*  CO2 16*   < > 15*  GLUCOSE 313*   < > 195*  BUN 44*   < > 35*  CREATININE 5.01*   < > 4.38*  CALCIUM 7.8*   < > 7.6*  AST 21  --   --   ALT 17  --   --   ALKPHOS 78  --   --   BILITOT 0.6  --   --    < > = values in this interval not  displayed.    Cardiac Enzymes No results for input(s): TROPONINI in the last 168 hours.  Microbiology Results  @[id number] @  RADIOLOGY:  US Renal  Result Date: 03/07/2019 CLINICAL DATA:  Evaluate for hydronephrosis. EXAM: RENAL / URINARY TRACT ULTRASOUND COMPLETE COMPARISON:  02/27/2018 FINDINGS: Right Kidney: Renal measurements: 10.3 x 5.3 x 5.3 cm. = volume: 149 mL. Diffusely increased echogenicity. No hydronephrosis or mass. Left Kidney: Renal measurements: 10.3 x 4.7 x 4.9 cm. = volume: 127 mL. Increased parenchymal echogenicity. No mass or hydronephrosis. Bladder: Appears normal for degree of bladder distention. Other: Small volume of ascites and a right pleural effusion incidentally noted. IMPRESSION: 1. Bilateral echogenic kidneys compatible with chronic medical renal disease. 2. No hydronephrosis. 3. Right pleural effusion and ascites. Electronically Signed   By: Lovena Le  Clovis Riley M.D.   On: 03/07/2019 09:09      Allergies as of 03/07/2019      Reactions   Ferrous Gluconate Nausea And Vomiting      Medication List    STOP taking these medications   glimepiride 4 MG tablet Commonly known as: AMARYL   lisinopril 30 MG tablet Commonly known as: ZESTRIL     TAKE these medications   amLODipine 10 MG tablet Commonly known as: NORVASC Take 1 tablet (10 mg total) by mouth daily.   gabapentin 300 MG capsule Commonly known as: NEURONTIN Take 1 capsule (300 mg total) by mouth 2 (two) times daily. What changed: when to take this   insulin glargine 100 UNIT/ML injection Commonly known as: LANTUS Inject 0.15 mLs (15 Units total) into the skin daily. What changed: how much to take   loperamide 2 MG tablet Commonly known as: IMODIUM A-D Take 2 mg by mouth 4 (four) times daily as needed for diarrhea or loose stools.   metoprolol tartrate 50 MG tablet Commonly known as: LOPRESSOR Take 50 mg by mouth 2 (two) times daily.   metroNIDAZOLE 500 MG tablet Commonly known as:  FLAGYL Take 500 mg by mouth 3 (three) times daily.   pantoprazole 40 MG tablet Commonly known as: PROTONIX Take 40 mg by mouth daily.   sertraline 50 MG tablet Commonly known as: ZOLOFT Take 50 mg by mouth daily.   sodium bicarbonate 650 MG tablet Take 650 mg by mouth 2 (two) times daily.         Management plans discussed with the patient and he is in agreement. Stable for discharge home  Patient should follow up with pcp  CODE STATUS:     Code Status Orders  (From admission, onward)         Start     Ordered   03/04/19 2104  Full code  Continuous     03/04/19 2110        Code Status History    Date Active Date Inactive Code Status Order ID Comments User Context   07/25/2018 1842 07/27/2018 1502 Full Code [id number]  Henreitta Leber, MD Inpatient   02/26/2018 2332 03/04/2018 1859 Full Code [id number]  Lance Coon, MD Inpatient   01/15/2018 2158 01/16/2018 1909 Full Code [id number]  Henreitta Leber, MD Inpatient   08/17/2017 0000 08/21/2017 1532 Full Code [id number]  Lance Coon, MD ED   06/29/2016 2124 07/01/2016 1805 Full Code [id number]  Vaughan Basta, MD Inpatient   Advance Care Planning Activity    Advance Directive Documentation     Most Recent Value  Type of Advance Directive  Living will  Pre-existing out of facility DNR order (yellow form or pink MOST form)  -  "MOST" Form in Place?  -      TOTAL TIME TAKING CARE OF THIS PATIENT: 38 minutes.    Note: This dictation was prepared with Dragon dictation along with smaller phrase technology. Any transcriptional errors that result from this process are unintentional.  Bettey Costa M.D on 03/07/2019 at 10:09 AM  Between 7am to 6pm - Pager - [phone] After 6pm go to [email or web address] - password [email or web address] [facility]  Office  [phone]  CC: Primary care physician; [name], MD

## 2019-03-09 ENCOUNTER — Encounter: Payer: Self-pay | Admitting: Gastroenterology

## 2019-03-09 LAB — GI PATHOGEN PANEL BY PCR, STOOL

## 2019-03-09 NOTE — Anesthesia Postprocedure Evaluation (Signed)
Anesthesia Post Note  [name]  Procedure(s) Performed: COLONOSCOPY WITH PROPOFOL (N/A )  Patient location during evaluation: PACU Anesthesia Type: General Level of consciousness: awake and alert Pain management: pain level controlled Vital Signs Assessment: post-procedure vital signs reviewed and stable Respiratory status: spontaneous breathing, nonlabored ventilation and respiratory function stable Cardiovascular status: blood pressure returned to baseline and stable Postop Assessment: no apparent nausea or vomiting Anesthetic complications: no     Last Vitals:  Vitals:   03/07/19 1110 03/07/19 1200  BP: (!) 159/95 (!) 148/92  Pulse:  80  Resp:  18  Temp:  36.9 C  SpO2:  98%    Last Pain:  Vitals:   03/07/19 1200  TempSrc: Oral  PainSc: 0-No pain                 Durenda Hurt

## 2019-03-13 ENCOUNTER — Telehealth: Payer: Self-pay

## 2019-03-13 NOTE — Telephone Encounter (Signed)
LVM for pt to return my call to schedule follow up procedure appt.

## 2019-03-13 NOTE — Telephone Encounter (Signed)
-----   Message from Lucilla Lame, MD sent at 03/11/2019  8:08 AM EDT ----- Please have the patient come in for a follow up.

## 2019-03-18 ENCOUNTER — Telehealth: Payer: Self-pay

## 2019-03-18 NOTE — Telephone Encounter (Signed)
-----   Message from Lucilla Lame, MD sent at 03/17/2019  7:36 AM EST ----- A few of the patient's polyps showed dysplasia and the patient did come in to discuss his pathology results and further work-up for his diarrhea.

## 2019-03-18 NOTE — Telephone Encounter (Signed)
Left vm for pt to return my call to schedule follow up procedure appt.

## 2019-03-18 NOTE — Telephone Encounter (Signed)
-----   Message from Lucilla Lame, MD sent at 03/11/2019  8:08 AM EDT ----- Please have the patient come in for a follow up.

## 2019-03-19 LAB — SURGICAL PATHOLOGY

## 2019-03-24 ENCOUNTER — Telehealth: Payer: Self-pay

## 2019-03-24 ENCOUNTER — Other Ambulatory Visit: Payer: Self-pay

## 2019-03-24 NOTE — Telephone Encounter (Signed)
-----   Message from Lucilla Lame, MD sent at 03/17/2019  7:36 AM EST ----- A few of the patient's polyps showed dysplasia and the patient did come in to discuss his pathology results and further work-up for his diarrhea.

## 2019-03-24 NOTE — Telephone Encounter (Signed)
Left vm again today for pt to return my call to schedule follow up appt. Mailed letter requesting a call back to schedule.

## 2019-04-08 ENCOUNTER — Other Ambulatory Visit: Payer: Self-pay

## 2019-04-08 ENCOUNTER — Encounter: Payer: Self-pay | Admitting: Emergency Medicine

## 2019-04-08 ENCOUNTER — Inpatient Hospital Stay
Admission: EM | Admit: 2019-04-08 | Discharge: 2019-04-15 | DRG: 674 | Disposition: A | Discharge status: Home / Self Care | Payer: Medicare Other | Attending: Internal Medicine | Admitting: Internal Medicine

## 2019-04-08 DIAGNOSIS — F419 Anxiety disorder, unspecified: Secondary | ICD-10-CM | POA: Diagnosis present

## 2019-04-08 DIAGNOSIS — N179 Acute kidney failure, unspecified: Secondary | ICD-10-CM | POA: Diagnosis not present

## 2019-04-08 DIAGNOSIS — A150 Tuberculosis of lung: Secondary | ICD-10-CM

## 2019-04-08 DIAGNOSIS — E1165 Type 2 diabetes mellitus with hyperglycemia: Secondary | ICD-10-CM | POA: Diagnosis present

## 2019-04-08 DIAGNOSIS — IMO0002 Reserved for concepts with insufficient information to code with codable children: Secondary | ICD-10-CM | POA: Diagnosis present

## 2019-04-08 DIAGNOSIS — N184 Chronic kidney disease, stage 4 (severe): Secondary | ICD-10-CM

## 2019-04-08 DIAGNOSIS — Z841 Family history of disorders of kidney and ureter: Secondary | ICD-10-CM

## 2019-04-08 DIAGNOSIS — Z888 Allergy status to other drugs, medicaments and biological substances status: Secondary | ICD-10-CM

## 2019-04-08 DIAGNOSIS — E1140 Type 2 diabetes mellitus with diabetic neuropathy, unspecified: Secondary | ICD-10-CM | POA: Diagnosis present

## 2019-04-08 DIAGNOSIS — K219 Gastro-esophageal reflux disease without esophagitis: Secondary | ICD-10-CM | POA: Diagnosis present

## 2019-04-08 DIAGNOSIS — E872 Acidosis: Secondary | ICD-10-CM | POA: Diagnosis present

## 2019-04-08 DIAGNOSIS — E1122 Type 2 diabetes mellitus with diabetic chronic kidney disease: Secondary | ICD-10-CM | POA: Diagnosis present

## 2019-04-08 DIAGNOSIS — Z8673 Personal history of transient ischemic attack (TIA), and cerebral infarction without residual deficits: Secondary | ICD-10-CM

## 2019-04-08 DIAGNOSIS — Z681 Body mass index (BMI) 19 or less, adult: Secondary | ICD-10-CM

## 2019-04-08 DIAGNOSIS — D631 Anemia in chronic kidney disease: Secondary | ICD-10-CM | POA: Diagnosis present

## 2019-04-08 DIAGNOSIS — N2581 Secondary hyperparathyroidism of renal origin: Secondary | ICD-10-CM | POA: Diagnosis present

## 2019-04-08 DIAGNOSIS — I10 Essential (primary) hypertension: Secondary | ICD-10-CM | POA: Diagnosis present

## 2019-04-08 DIAGNOSIS — R531 Weakness: Secondary | ICD-10-CM | POA: Diagnosis not present

## 2019-04-08 DIAGNOSIS — E876 Hypokalemia: Secondary | ICD-10-CM | POA: Diagnosis present

## 2019-04-08 DIAGNOSIS — Z8249 Family history of ischemic heart disease and other diseases of the circulatory system: Secondary | ICD-10-CM

## 2019-04-08 DIAGNOSIS — Z20828 Contact with and (suspected) exposure to other viral communicable diseases: Secondary | ICD-10-CM | POA: Diagnosis present

## 2019-04-08 DIAGNOSIS — N186 End stage renal disease: Secondary | ICD-10-CM

## 2019-04-08 DIAGNOSIS — I6523 Occlusion and stenosis of bilateral carotid arteries: Secondary | ICD-10-CM | POA: Diagnosis present

## 2019-04-08 DIAGNOSIS — F17210 Nicotine dependence, cigarettes, uncomplicated: Secondary | ICD-10-CM | POA: Diagnosis present

## 2019-04-08 DIAGNOSIS — R64 Cachexia: Secondary | ICD-10-CM | POA: Diagnosis present

## 2019-04-08 DIAGNOSIS — Z79899 Other long term (current) drug therapy: Secondary | ICD-10-CM

## 2019-04-08 DIAGNOSIS — Z818 Family history of other mental and behavioral disorders: Secondary | ICD-10-CM

## 2019-04-08 DIAGNOSIS — E860 Dehydration: Secondary | ICD-10-CM | POA: Diagnosis present

## 2019-04-08 DIAGNOSIS — Z794 Long term (current) use of insulin: Secondary | ICD-10-CM

## 2019-04-08 DIAGNOSIS — I120 Hypertensive chronic kidney disease with stage 5 chronic kidney disease or end stage renal disease: Secondary | ICD-10-CM | POA: Diagnosis present

## 2019-04-08 LAB — BASIC METABOLIC PANEL
Anion gap: 12 (ref 5–15)
BUN: 44 mg/dL — ABNORMAL HIGH (ref 6–20)
CO2: 14 mmol/L — ABNORMAL LOW (ref 22–32)
Calcium: 7.3 mg/dL — ABNORMAL LOW (ref 8.9–10.3)
Chloride: 109 mmol/L (ref 98–111)
Creatinine, Ser: 6.31 mg/dL — ABNORMAL HIGH (ref 0.61–1.24)
GFR calc Af Amer: 10 mL/min — ABNORMAL LOW (ref >60)
GFR calc non Af Amer: 9 mL/min — ABNORMAL LOW (ref >60)
Glucose, Bld: 294 mg/dL — ABNORMAL HIGH (ref 70–99)
Potassium: 3.4 mmol/L — ABNORMAL LOW (ref 3.5–5.1)
Sodium: 135 mmol/L (ref 135–145)

## 2019-04-08 LAB — MAGNESIUM: Magnesium: 1.7 mg/dL (ref 1.7–2.4)

## 2019-04-08 LAB — CBC
HCT: 31.3 % — ABNORMAL LOW (ref 39.0–52.0)
Hemoglobin: 9.9 g/dL — ABNORMAL LOW (ref 13.0–17.0)
MCH: 29.7 pg (ref 26.0–34.0)
MCHC: 31.6 g/dL (ref 30.0–36.0)
MCV: 94 fL (ref 80.0–100.0)
Platelets: 314 10*3/uL (ref 150–400)
RBC: 3.33 MIL/uL — ABNORMAL LOW (ref 4.22–5.81)
RDW: 13.2 % (ref 11.5–15.5)
WBC: 7.2 10*3/uL (ref 4.0–10.5)
nRBC: 0 % (ref 0.0–0.2)

## 2019-04-08 MED ORDER — SODIUM CHLORIDE 0.9% FLUSH: 3.0000 mL | Freq: Once | INTRAVENOUS | Status: DC

## 2019-04-08 MED ORDER — POTASSIUM CHLORIDE 10 MEQ/100ML IV SOLN
10.0000 meq | INTRAVENOUS | Status: AC
  Administered 2019-04-09 (×2): 10 meq via INTRAVENOUS
  Filled 2019-04-08 (×2): qty 100

## 2019-04-08 MED ORDER — STERILE WATER FOR INJECTION IV SOLN
INTRAVENOUS | Status: DC
  Administered 2019-04-08: 22:00:00 via INTRAVENOUS
  Filled 2019-04-08 (×3): qty 850

## 2019-04-08 NOTE — ED Triage Notes (Signed)
Sent to ED due to possible kidney faillure.  Pt's wife states patient has stage VI kidney failure, dehydration, hypokalemia.  Patient is AAOx3

## 2019-04-08 NOTE — ED Provider Notes (Signed)
[facility] Emergency Department Provider Note  ____________________________________________   First MD Initiated Contact with Patient 04/08/19 2106     (approximate)  I have reviewed the triage vital signs and the nursing notes.   HISTORY  Chief Complaint Weakness    HPI [name] is a 58 y.o. male  Here with generalized weakness. Pt has a h/o CKD, DM, HTN, HLD, CVA. He reportedly has been having progressively worsening decreased appetite. He has been feeling increasingly weak over the last several days. He went to his PCP and had lab work drawn, which showed elevated Cr compared to baseline. He was told to come here for evaluation. He feels weak, no appetite. Milld lightheadedness noted with standing. Denies any other med changes. No pain. No dysuria, frequency.       Past Medical History:  Diagnosis Date  . Chronic kidney disease   . Diabetes mellitus without complication ([place])   . ETOH abuse   . Hyperlipidemia   . Hypertension   . Stroke [facility])     Patient Active Problem List   Diagnosis Date Noted  . Polyp of ascending colon   . Diarrhea   . AKI (acute kidney injury) ([place]) 03/04/2019  . Acute kidney injury ([place]) 07/26/2018  . Acute on chronic renal failure ([place]) 07/25/2018  . CVA (cerebral vascular accident) ([place]) 03/17/2018  . UTI (urinary tract infection) 02/27/2018  . Hypoglycemia 01/15/2018  . Type 2 diabetes mellitus without complication, with long-term current use of insulin ([place]) 08/27/2017  . Protein-calorie malnutrition, severe 08/19/2017  . Pancreatitis, acute 08/16/2017  . DKA (diabetic ketoacidoses) ([place]) 08/16/2017  . HTN (hypertension) 08/16/2017  . HLD (hyperlipidemia) 08/16/2017  . Carotid stenosis 08/02/2016  . GERD (gastroesophageal reflux disease) 08/02/2016  . History of esophagogastroduodenoscopy (EGD) 07/01/2016  . History of recent blood transfusion 07/01/2016  . Acute renal failure superimposed on stage 3  chronic kidney disease ([place]) 07/01/2016  . Hyponatremia 07/01/2016  . Uncontrolled diabetes mellitus ([place]) 07/01/2016  . Alcohol abuse 07/01/2016  . Monilial esophagitis ([place]) 07/01/2016  . Tobacco abuse 07/01/2016  . Confusion 06/29/2016  . Iron deficiency anemia 06/29/2016  . Coagulopathy ([place]) 06/29/2016    Past Surgical History:  Procedure Laterality Date  . COLONOSCOPY WITH PROPOFOL N/A 03/06/2019   Procedure: COLONOSCOPY WITH PROPOFOL;  Surgeon: Lucilla Lame, MD;  Location: [facility] ENDOSCOPY;  Service: Endoscopy;  Laterality: N/A;  . ESOPHAGOGASTRODUODENOSCOPY (EGD) WITH PROPOFOL N/A 07/01/2016   Procedure: ESOPHAGOGASTRODUODENOSCOPY (EGD) WITH PROPOFOL;  Surgeon: San Jetty, MD;  Location: [facility] ENDOSCOPY;  Service: General;  Laterality: N/A;    Prior to Admission medications   Medication Sig Start Date End Date Taking? Authorizing Provider  amLODipine (NORVASC) 2.5 MG tablet Take 2.5 mg by mouth daily. 04/08/19   [provider]  folic acid (FOLVITE) 1 MG tablet Take 1 mg by mouth daily. 03/12/19   [provider]  gabapentin (NEURONTIN) 300 MG capsule Take 1 capsule (300 mg total) by mouth 2 (two) times daily. Patient taking differently: Take 300 mg by mouth 3 (three) times daily.  07/27/18   Bettey Costa, MD  hydrocortisone 2.5 % cream  04/08/19   [provider]  insulin glargine (LANTUS) 100 UNIT/ML injection Inject 0.15 mLs (15 Units total) into the skin daily. Patient taking differently: Inject 8 Units into the skin daily.  07/27/18   Bettey Costa, MD  metoprolol tartrate (LOPRESSOR) 50 MG tablet Take 50 mg by mouth 2 (two) times daily. 02/16/19 02/16/20  [provider]  metroNIDAZOLE (FLAGYL) 500 MG tablet Take 500 mg by mouth 3 (three) times daily. 03/04/19   [provider]  pantoprazole (PROTONIX) 40 MG tablet Take 40 mg by mouth daily. 12/18/18   [provider]  sertraline (ZOLOFT) 100 MG tablet Take 100 mg by mouth daily.  04/08/19   [provider]  sodium bicarbonate 650 MG tablet Take 650 mg by mouth 2 (two) times daily. 03/01/19   [provider]  torsemide (DEMADEX) 20 MG tablet  03/25/19   [provider]    Allergies Ferrous gluconate  Family History  Problem Relation Age of Onset  . CAD Brother   . Dementia Mother   . Renal Disease Father     Social History Social History   Tobacco Use  . Smoking status: Current Every Day Smoker    Packs/day: 0.25    Years: 5.00    Pack years: 1.25    Types: Cigars  . Smokeless tobacco: Never Used  Substance Use Topics  . Alcohol use: Yes    Alcohol/week: 16.0 standard drinks    Types: 2 Shots of liquor, 14 Cans of beer per week  . Drug use: No    Review of Systems  Review of Systems  Constitutional: Positive for fatigue. Negative for chills and fever.  HENT: Negative for sore throat.   Respiratory: Negative for shortness of breath.   Cardiovascular: Negative for chest pain.  Gastrointestinal: Positive for nausea. Negative for abdominal pain.  Genitourinary: Negative for flank pain.  Musculoskeletal: Negative for neck pain.  Skin: Negative for rash and wound.  Allergic/Immunologic: Negative for immunocompromised state.  Neurological: Positive for weakness. Negative for numbness.  Hematological: Does not bruise/bleed easily.  All other systems reviewed and are negative.    ____________________________________________  PHYSICAL EXAM:      VITAL SIGNS: ED Triage Vitals  Enc Vitals Group     BP 04/08/19 1654 103/70     Pulse Rate 04/08/19 1654 70     Resp 04/08/19 1654 15     Temp 04/08/19 1654 97.7 F (36.5 C)     Temp Source 04/08/19 1654 Oral     SpO2 04/08/19 1654 99 %     Weight 04/08/19 1651 147 lb 7.8 oz (66.9 kg)     Height 04/08/19 1651 5\' 11"  (1.803 m)     Head Circumference --      Peak Flow --      Pain Score 04/08/19 1651 0     Pain Loc --      Pain Edu? --      Excl. in [place]? --       Physical Exam Vitals signs and nursing note reviewed.  Constitutional:      General: He is not in acute distress.    Appearance: He is well-developed.     Comments: Cachectic, chronically ill appearing  HENT:     Head: Normocephalic and atraumatic.     Mouth/Throat:     Mouth: Mucous membranes are dry.  Eyes:     Conjunctiva/sclera: Conjunctivae normal.  Neck:     Musculoskeletal: Neck supple.  Cardiovascular:     Rate and Rhythm: Normal rate and regular rhythm.     Heart sounds: Normal heart sounds. No murmur. No friction rub.  Pulmonary:     Effort: Pulmonary effort is normal. No respiratory distress.     Breath sounds: Normal breath sounds. No wheezing or rales.  Abdominal:     General: There  is no distension.     Palpations: Abdomen is soft.     Tenderness: There is no abdominal tenderness.  Skin:    General: Skin is warm.     Capillary Refill: Capillary refill takes less than 2 seconds.  Neurological:     Mental Status: He is alert and oriented to person, place, and time.     Motor: No abnormal muscle tone.       ____________________________________________   LABS (all labs ordered are listed, but only abnormal results are displayed)  Labs Reviewed  BASIC METABOLIC PANEL - Abnormal; Notable for the following components:      Result Value   Potassium 3.4 (*)    CO2 14 (*)    Glucose, Bld 294 (*)    BUN 44 (*)    Creatinine, Ser 6.31 (*)    Calcium 7.3 (*)    GFR calc non Af Amer 9 (*)    GFR calc Af Amer 10 (*)    All other components within normal limits  CBC - Abnormal; Notable for the following components:   RBC 3.33 (*)    Hemoglobin 9.9 (*)    HCT 31.3 (*)    All other components within normal limits  SARS CORONAVIRUS 2 (TAT 6-24 HRS)  MAGNESIUM  URINALYSIS, COMPLETE (UACMP) WITH MICROSCOPIC    ____________________________________________  EKG: None ________________________________________  RADIOLOGY All imaging, including plain  films, CT scans, and ultrasounds, independently reviewed by me, and interpretations confirmed via formal radiology reads.  ED MD interpretation:   None  Official radiology report(s): No results found.  ____________________________________________  PROCEDURES   Procedure(s) performed (including Critical Care):  Procedures  ____________________________________________  INITIAL IMPRESSION / MDM / [place] / ED COURSE  As part of my medical decision making, I reviewed the following data within the [facility] notes reviewed and incorporated, Old chart reviewed, Notes from prior ED visits, and [place] Controlled Substance Database       *[name] was evaluated in Emergency Department on 04/08/2019 for the symptoms described in the history of present illness. He was evaluated in the context of the global COVID-19 pandemic, which necessitated consideration that the patient might be at risk for infection with the SARS-CoV-2 virus that causes COVID-19. Institutional protocols and algorithms that pertain to the evaluation of patients at risk for COVID-19 are in a state of rapid change based on information released by regulatory bodies including the CDC and federal and state organizations. These policies and algorithms were followed during the patient's care in the ED.  Some ED evaluations and interventions may be delayed as a result of limited staffing during the pandemic.*     Medical Decision Making:  58 yo M here with acute on chronic renal failure, likely 2/2 poor PO intake. No fever, chills, or infectious sx. Admit for fluids, likely renal c/s in AM. Lytes otherwise acceptable. Isotonic bicarb started, will supplement K cautiously.  ____________________________________________  FINAL CLINICAL IMPRESSION(S) / ED DIAGNOSES  Final diagnoses:  Acute renal failure superimposed on stage 4 chronic kidney disease, unspecified acute renal failure type ([place])      MEDICATIONS GIVEN DURING THIS VISIT:  Medications  sodium bicarbonate 150 mEq in sterile water 1,000 mL infusion ( Intravenous New Bag/Given 04/08/19 2211)     ED Discharge Orders    None       Note:  This document was prepared using Dragon voice recognition software and may include unintentional dictation errors.  Duffy Bruce, MD 04/08/19 [phone]

## 2019-04-09 ENCOUNTER — Encounter: Payer: Self-pay | Admitting: Internal Medicine

## 2019-04-09 DIAGNOSIS — F17210 Nicotine dependence, cigarettes, uncomplicated: Secondary | ICD-10-CM | POA: Diagnosis present

## 2019-04-09 DIAGNOSIS — I120 Hypertensive chronic kidney disease with stage 5 chronic kidney disease or end stage renal disease: Secondary | ICD-10-CM | POA: Diagnosis present

## 2019-04-09 DIAGNOSIS — Z20828 Contact with and (suspected) exposure to other viral communicable diseases: Secondary | ICD-10-CM | POA: Diagnosis present

## 2019-04-09 DIAGNOSIS — D631 Anemia in chronic kidney disease: Secondary | ICD-10-CM | POA: Diagnosis present

## 2019-04-09 DIAGNOSIS — E872 Acidosis: Secondary | ICD-10-CM | POA: Diagnosis present

## 2019-04-09 DIAGNOSIS — N2581 Secondary hyperparathyroidism of renal origin: Secondary | ICD-10-CM | POA: Diagnosis present

## 2019-04-09 DIAGNOSIS — N186 End stage renal disease: Secondary | ICD-10-CM | POA: Diagnosis present

## 2019-04-09 DIAGNOSIS — I6523 Occlusion and stenosis of bilateral carotid arteries: Secondary | ICD-10-CM | POA: Diagnosis present

## 2019-04-09 DIAGNOSIS — E860 Dehydration: Secondary | ICD-10-CM | POA: Diagnosis present

## 2019-04-09 DIAGNOSIS — Z681 Body mass index (BMI) 19 or less, adult: Secondary | ICD-10-CM | POA: Diagnosis not present

## 2019-04-09 DIAGNOSIS — Z818 Family history of other mental and behavioral disorders: Secondary | ICD-10-CM | POA: Diagnosis not present

## 2019-04-09 DIAGNOSIS — I10 Essential (primary) hypertension: Secondary | ICD-10-CM

## 2019-04-09 DIAGNOSIS — R531 Weakness: Secondary | ICD-10-CM | POA: Diagnosis present

## 2019-04-09 DIAGNOSIS — E08641 Diabetes mellitus due to underlying condition with hypoglycemia with coma: Secondary | ICD-10-CM | POA: Diagnosis not present

## 2019-04-09 DIAGNOSIS — E876 Hypokalemia: Secondary | ICD-10-CM | POA: Diagnosis present

## 2019-04-09 DIAGNOSIS — E7800 Pure hypercholesterolemia, unspecified: Secondary | ICD-10-CM | POA: Diagnosis not present

## 2019-04-09 DIAGNOSIS — E1140 Type 2 diabetes mellitus with diabetic neuropathy, unspecified: Secondary | ICD-10-CM | POA: Diagnosis present

## 2019-04-09 DIAGNOSIS — R64 Cachexia: Secondary | ICD-10-CM | POA: Diagnosis present

## 2019-04-09 DIAGNOSIS — N179 Acute kidney failure, unspecified: Principal | ICD-10-CM | POA: Diagnosis present

## 2019-04-09 DIAGNOSIS — N170 Acute kidney failure with tubular necrosis: Secondary | ICD-10-CM | POA: Diagnosis not present

## 2019-04-09 DIAGNOSIS — I151 Hypertension secondary to other renal disorders: Secondary | ICD-10-CM | POA: Diagnosis not present

## 2019-04-09 DIAGNOSIS — E1165 Type 2 diabetes mellitus with hyperglycemia: Secondary | ICD-10-CM | POA: Diagnosis present

## 2019-04-09 DIAGNOSIS — Z8249 Family history of ischemic heart disease and other diseases of the circulatory system: Secondary | ICD-10-CM | POA: Diagnosis not present

## 2019-04-09 DIAGNOSIS — Z841 Family history of disorders of kidney and ureter: Secondary | ICD-10-CM | POA: Diagnosis not present

## 2019-04-09 DIAGNOSIS — Z8673 Personal history of transient ischemic attack (TIA), and cerebral infarction without residual deficits: Secondary | ICD-10-CM | POA: Diagnosis not present

## 2019-04-09 DIAGNOSIS — K219 Gastro-esophageal reflux disease without esophagitis: Secondary | ICD-10-CM | POA: Diagnosis present

## 2019-04-09 DIAGNOSIS — F419 Anxiety disorder, unspecified: Secondary | ICD-10-CM | POA: Diagnosis present

## 2019-04-09 DIAGNOSIS — E1122 Type 2 diabetes mellitus with diabetic chronic kidney disease: Secondary | ICD-10-CM | POA: Diagnosis present

## 2019-04-09 LAB — COMPREHENSIVE METABOLIC PANEL
ALT: 7 U/L (ref 0–44)
AST: 9 U/L — ABNORMAL LOW (ref 15–41)
Albumin: 2 g/dL — ABNORMAL LOW (ref 3.5–5.0)
Alkaline Phosphatase: 99 U/L (ref 38–126)
Anion gap: 9 (ref 5–15)
BUN: 49 mg/dL — ABNORMAL HIGH (ref 6–20)
CO2: 21 mmol/L — ABNORMAL LOW (ref 22–32)
Calcium: 7 mg/dL — ABNORMAL LOW (ref 8.9–10.3)
Chloride: 106 mmol/L (ref 98–111)
Creatinine, Ser: 6.13 mg/dL — ABNORMAL HIGH (ref 0.61–1.24)
GFR calc Af Amer: 11 mL/min — ABNORMAL LOW (ref >60)
GFR calc non Af Amer: 9 mL/min — ABNORMAL LOW (ref >60)
Glucose, Bld: 460 mg/dL — ABNORMAL HIGH (ref 70–99)
Potassium: 3.3 mmol/L — ABNORMAL LOW (ref 3.5–5.1)
Sodium: 136 mmol/L (ref 135–145)
Total Bilirubin: 0.4 mg/dL (ref 0.3–1.2)
Total Protein: 6.6 g/dL (ref 6.5–8.1)

## 2019-04-09 LAB — CBC
HCT: 25.2 % — ABNORMAL LOW (ref 39.0–52.0)
Hemoglobin: 8.5 g/dL — ABNORMAL LOW (ref 13.0–17.0)
MCH: 30.1 pg (ref 26.0–34.0)
MCHC: 33.7 g/dL (ref 30.0–36.0)
MCV: 89.4 fL (ref 80.0–100.0)
Platelets: 281 10*3/uL (ref 150–400)
RBC: 2.82 MIL/uL — ABNORMAL LOW (ref 4.22–5.81)
RDW: 12.9 % (ref 11.5–15.5)
WBC: 6.2 10*3/uL (ref 4.0–10.5)
nRBC: 0 % (ref 0.0–0.2)

## 2019-04-09 LAB — URINALYSIS, COMPLETE (UACMP) WITH MICROSCOPIC
Bacteria, UA: NONE SEEN
Bilirubin Urine: NEGATIVE
Glucose, UA: >=500 mg/dL — AB
Ketones, ur: NEGATIVE mg/dL
Nitrite: NEGATIVE
Protein, ur: >=300 mg/dL — AB
Specific Gravity, Urine: 1.012 (ref 1.005–1.030)
pH: 5 (ref 5.0–8.0)

## 2019-04-09 LAB — CREATININE, SERUM
Creatinine, Ser: 6.32 mg/dL — ABNORMAL HIGH (ref 0.61–1.24)
GFR calc Af Amer: 10 mL/min — ABNORMAL LOW (ref >60)
GFR calc non Af Amer: 9 mL/min — ABNORMAL LOW (ref >60)

## 2019-04-09 LAB — GLUCOSE, CAPILLARY
Glucose-Capillary: 452 mg/dL — ABNORMAL HIGH (ref 70–99)
Glucose-Capillary: 141 mg/dL — ABNORMAL HIGH (ref 70–99)
Glucose-Capillary: 183 mg/dL — ABNORMAL HIGH (ref 70–99)
Glucose-Capillary: 162 mg/dL — ABNORMAL HIGH (ref 70–99)

## 2019-04-09 LAB — SARS CORONAVIRUS 2 (TAT 6-24 HRS): SARS Coronavirus 2: NEGATIVE

## 2019-04-09 LAB — CK: Total CK: 37 U/L — ABNORMAL LOW (ref 49–397)

## 2019-04-09 MED ORDER — INSULIN GLARGINE 100 UNIT/ML ~~LOC~~ SOLN
8.0000 [IU] | Freq: Every day | SUBCUTANEOUS | Status: DC
  Administered 2019-04-09 – 2019-04-13 (×4): 8 [IU] via SUBCUTANEOUS
  Filled 2019-04-09 (×6): qty 0.08

## 2019-04-09 MED ORDER — FOLIC ACID 1 MG PO TABS
1.0000 mg | ORAL_TABLET | Freq: Every day | ORAL | Status: DC
  Administered 2019-04-09 – 2019-04-15 (×6): 1 mg via ORAL
  Filled 2019-04-09 (×7): qty 1

## 2019-04-09 MED ORDER — METOPROLOL TARTRATE 50 MG PO TABS
50.0000 mg | ORAL_TABLET | Freq: Two times a day (BID) | ORAL | Status: DC
  Administered 2019-04-09 – 2019-04-15 (×11): 50 mg via ORAL
  Filled 2019-04-09 (×11): qty 1

## 2019-04-09 MED ORDER — AMLODIPINE BESYLATE 5 MG PO TABS
2.5000 mg | ORAL_TABLET | Freq: Every day | ORAL | Status: DC
  Administered 2019-04-09 – 2019-04-15 (×6): 2.5 mg via ORAL
  Filled 2019-04-09 (×6): qty 1

## 2019-04-09 MED ORDER — SODIUM BICARBONATE 650 MG PO TABS
650.0000 mg | ORAL_TABLET | Freq: Two times a day (BID) | ORAL | Status: DC
  Administered 2019-04-09 – 2019-04-13 (×10): 650 mg via ORAL
  Filled 2019-04-09 (×10): qty 1

## 2019-04-09 MED ORDER — SODIUM CHLORIDE 0.9 % IV SOLN
INTRAVENOUS | Status: DC
  Administered 2019-04-09 – 2019-04-10 (×5): via INTRAVENOUS

## 2019-04-09 MED ORDER — PANTOPRAZOLE SODIUM 40 MG PO TBEC
40.0000 mg | DELAYED_RELEASE_TABLET | Freq: Every day | ORAL | Status: DC
  Administered 2019-04-09 – 2019-04-15 (×6): 40 mg via ORAL
  Filled 2019-04-09 (×7): qty 1

## 2019-04-09 MED ORDER — ACETAMINOPHEN 650 MG RE SUPP: 650.0000 mg | Freq: Four times a day (QID) | RECTAL | Status: DC | PRN

## 2019-04-09 MED ORDER — INSULIN ASPART 100 UNIT/ML ~~LOC~~ SOLN
0.0000 [IU] | Freq: Every day | SUBCUTANEOUS | Status: DC
  Administered 2019-04-10: 3 [IU] via SUBCUTANEOUS
  Filled 2019-04-09: qty 1

## 2019-04-09 MED ORDER — ONDANSETRON HCL 4 MG/2ML IJ SOLN: 4.0000 mg | Freq: Four times a day (QID) | INTRAMUSCULAR | Status: DC | PRN

## 2019-04-09 MED ORDER — ACETAMINOPHEN 325 MG PO TABS: 650.0000 mg | ORAL_TABLET | Freq: Four times a day (QID) | ORAL | Status: DC | PRN

## 2019-04-09 MED ORDER — INSULIN ASPART 100 UNIT/ML ~~LOC~~ SOLN
0.0000 [IU] | Freq: Three times a day (TID) | SUBCUTANEOUS | Status: DC
  Administered 2019-04-09: 6 [IU] via SUBCUTANEOUS
  Administered 2019-04-09: 1 [IU] via SUBCUTANEOUS
  Administered 2019-04-11: 2 [IU] via SUBCUTANEOUS
  Administered 2019-04-11: 1 [IU] via SUBCUTANEOUS
  Administered 2019-04-11: 3 [IU] via SUBCUTANEOUS
  Administered 2019-04-13 – 2019-04-15 (×2): 1 [IU] via SUBCUTANEOUS
  Filled 2019-04-09 (×7): qty 1

## 2019-04-09 MED ORDER — HEPARIN SODIUM (PORCINE) 5000 UNIT/ML IJ SOLN
5000.0000 [IU] | Freq: Three times a day (TID) | INTRAMUSCULAR | Status: DC
  Administered 2019-04-09 – 2019-04-15 (×13): 5000 [IU] via SUBCUTANEOUS
  Filled 2019-04-09 (×15): qty 1

## 2019-04-09 MED ORDER — SERTRALINE HCL 50 MG PO TABS
100.0000 mg | ORAL_TABLET | Freq: Every day | ORAL | Status: DC
  Administered 2019-04-09 – 2019-04-15 (×7): 100 mg via ORAL
  Filled 2019-04-09 (×7): qty 2

## 2019-04-09 NOTE — Progress Notes (Signed)
PROGRESS NOTE    [name]  [id number] DOB: December 05, 1960 DOA: 04/08/2019 PCP: [name], MD   Brief Narrative:  [name]  is a 57 y.o. 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  Pt denies nsaid use or recent iv dye exposure.  Pt states that he had rash and was seen by Dr. Marcelino Scot at [facility] and had labs and sent to ER for evaluation of ARF  Prior spep negative for M spike 07/26/2018.     Assessment & Plan:   Principal Problem:   ARF (acute renal failure) (HCC) Active Problems:   Acute renal failure superimposed on stage 3 chronic kidney disease (HCC)   Uncontrolled diabetes mellitus (HCC)   HTN (hypertension)   Acute renal failure (ARF) (HCC)   Acute on chronic CKD 3/4 renal failure: Agree with holding Demadex, fluids with sodium bicarb, Patient reported he had recent conversation with his nephrologist about likely needing dialysis We will continue hydration for 24 hours monitor creatinine before initiating nephrology consult Recheck BMP in the morning  Nonanion gap acidosis secondary to diarrhea This would be consistent with volume loss and bicarb loss explaining patient's acute renal failure as above. Continue fluid depletion Labs again in the morning Check C. difficile and GI pathogen panel  Hypokalemia Monitor cautious repletion given patient's acute renal failure Recheck BMP in the morning  Diabetes type 2 Continue sliding scale insulin Check blood glucose AC at bedtime  Diabetic neuropathy Gabapentin was stopped secondary renal insufficiency Start pending recovery of renal function  Hypertension Agree with continuing amlodipine and stopping Demadex  GERD patient is on PPI which I will continue  Anxiety  Patient at baseline no acute decompensation Continue SSRI at 100 mg p.o. daily for Zoloft  DVT prophylaxis:  SCD/Compression stockings  Code Status: FULL    Code Status Orders  (From admission, onward)         Start     Ordered   04/09/19 0207  Full code  Continuous     04/09/19 0207        Code Status History    Date Active Date Inactive Code Status Order ID Comments User Context   03/04/2019 2110 03/07/2019 1654 Full Code [id number]  Lang Snow, NP ED   07/25/2018 1842 07/27/2018 1502 Full Code [id number]  Henreitta Leber, MD Inpatient   02/26/2018 2332 03/04/2018 1859 Full Code [id number]  Lance Coon, MD Inpatient   01/15/2018 2158 01/16/2018 1909 Full Code [id number]  Henreitta Leber, MD Inpatient   08/17/2017 0000 08/21/2017 1532 Full Code [id number]  Lance Coon, MD ED   06/29/2016 2124 07/01/2016 1805 Full Code [id number]  Vaughan Basta, MD Inpatient   Advance Care Planning Activity     Family Communication: Juline Patch UK:[phone], na Disposition Plan:   Patient remained inpatient for continued treatments for acute renal failure with IV hydration, electrolyte monitoring, patient not stable for discharge. Consults called: None Admission status: Inpatient   Consultants:   None  Procedures:   No results found.  Antimicrobials:   None   Subjective: No acute events overnight. Patient did report that he had a recent conversation with his nephrologist for the possible need of dialysis in the near future  Objective: Vitals:   04/09/19 0133 04/09/19 0408 04/09/19 0455 04/09/19 0816  BP: (!) 144/86 (!) 152/88 134/80 132/78  Pulse: 72 78 85 80  Resp:  16 16 20 18   Temp:   (!) 97.5 F (36.4 C) 98.6 F (37 C)  TempSrc:   Oral Oral  SpO2: 99% 99% 100% 100%  Weight:   61.8 kg   Height:   5\' 11"  (1.803 m)     Intake/Output Summary (Last 24 hours) at 04/09/2019 1036 Last data filed at 04/09/2019 0900 Gross per 24 hour  Intake 859.33 ml  Output 100 ml  Net 759.33 ml   Filed Weights   04/08/19 1651 04/09/19 0455  Weight: 66.9 kg 61.8 kg     Examination:  General exam: Appears calm and comfortable  Respiratory system: Clear to auscultation. Respiratory effort normal. Cardiovascular system: S1 & S2 heard, RRR. No JVD, murmurs, rubs, gallops or clicks. No pedal edema. Gastrointestinal system: Abdomen is nondistended, soft and nontender. No organomegaly or masses felt. Normal bowel sounds heard. Central nervous system: Alert and oriented. No focal neurological deficits. Extremities: Warm well perfused moves all 4 extremities freely. Skin: No rashes, lesions or ulcers Psychiatry: Judgement and insight appear normal. Mood & affect appropriate.     Data Reviewed: I have personally reviewed following labs and imaging studies  CBC: Recent Labs  Lab 04/08/19 1657 04/09/19 0529  WBC 7.2 6.2  HGB 9.9* 8.5*  HCT 31.3* 25.2*  MCV 94.0 89.4  PLT 314 [id number]   Basic Metabolic Panel: Recent Labs  Lab 04/08/19 1657 04/09/19 0241 04/09/19 0529  NA 135  --  136  K 3.4*  --  3.3*  CL 109  --  106  CO2 14*  --  21*  GLUCOSE 294*  --  460*  BUN 44*  --  49*  CREATININE 6.31* 6.32* 6.13*  CALCIUM 7.3*  --  7.0*  MG 1.7  --   --    GFR: Estimated Creatinine Clearance: 11.3 mL/min (A) (by C-G formula based on SCr of 6.13 mg/dL (H)). Liver Function Tests: Recent Labs  Lab 04/09/19 0529  AST 9*  ALT 7  ALKPHOS 99  BILITOT 0.4  PROT 6.6  ALBUMIN 2.0*   No results for input(s): LIPASE, AMYLASE in the last 168 hours. No results for input(s): AMMONIA in the last 168 hours. Coagulation Profile: No results for input(s): INR, PROTIME in the last 168 hours. Cardiac Enzymes: Recent Labs  Lab 04/09/19 0529  CKTOTAL 37*   BNP (last 3 results) No results for input(s): PROBNP in the last 8760 hours. HbA1C: No results for input(s): HGBA1C in the last 72 hours. CBG: Recent Labs  Lab 04/09/19 0754  GLUCAP 452*   Lipid Profile: No results for input(s): CHOL, HDL, LDLCALC, TRIG, CHOLHDL, LDLDIRECT in the last 72  hours. Thyroid Function Tests: No results for input(s): TSH, T4TOTAL, FREET4, T3FREE, THYROIDAB in the last 72 hours. Anemia Panel: No results for input(s): VITAMINB12, FOLATE, FERRITIN, TIBC, IRON, RETICCTPCT in the last 72 hours. Sepsis Labs: No results for input(s): PROCALCITON, LATICACIDVEN in the last 168 hours.  Recent Results (from the past 240 hour(s))  SARS CORONAVIRUS 2 (TAT 6-24 HRS) Nasopharyngeal Nasopharyngeal Swab     Status: None   Collection Time: 04/08/19 10:49 PM   Specimen: Nasopharyngeal Swab  Result Value Ref Range Status   SARS Coronavirus 2 NEGATIVE NEGATIVE Final    Comment: (NOTE) SARS-CoV-2 target nucleic acids are NOT DETECTED. The SARS-CoV-2 RNA is generally detectable in upper and lower respiratory specimens during the acute phase of infection. Negative results do not preclude SARS-CoV-2 infection, do not rule out co-infections with other pathogens, and  should not be used as the sole basis for treatment or other patient management decisions. Negative results must be combined with clinical observations, patient history, and epidemiological information. The expected result is Negative. Fact Sheet for Patients: SugarRoll.be Fact Sheet for Healthcare Providers: https://www.woods-mathews.com/ This test is not yet approved or cleared by the [place] FDA and  has been authorized for detection and/or diagnosis of SARS-CoV-2 by FDA under an Emergency Use Authorization (EUA). This EUA will remain  in effect (meaning this test can be used) for the duration of the COVID-19 declaration under Section 56 4(b)(1) of the Act, 21 U.S.C. section 360bbb-3(b)(1), unless the authorization is terminated or revoked sooner. Performed at [facility], [place] [address], [place], [place] [address]          Radiology Studies: No results found.      Scheduled Meds: . amLODipine  2.5 mg Oral Daily  . folic acid  1  mg Oral Daily  . heparin  5,000 Units Subcutaneous Q8H  . insulin aspart  0-5 Units Subcutaneous QHS  . insulin aspart  0-6 Units Subcutaneous TID WC  . insulin glargine  8 Units Subcutaneous Daily  . metoprolol tartrate  50 mg Oral BID  . pantoprazole  40 mg Oral Daily  . sertraline  100 mg Oral Daily  . sodium bicarbonate  650 mg Oral BID   Continuous Infusions: .  sodium bicarbonate (isotonic) infusion in sterile water 50 mL/hr at 04/09/19 0500     LOS: 0 days    Time spent: 35 min    Nicolette Bang, MD [facility]  If 7PM-7AM, please contact night-coverage  04/09/2019, 10:36 AM

## 2019-04-09 NOTE — ED Notes (Signed)
ED TO INPATIENT HANDOFF REPORT  ED Nurse Name and Phone #: Molly Maduro [phone] & Daiva Nakayama, RN [phone]  S Name/Age/Gender [name] 58 y.o. male Room/Bed: ED05HA/ED05HA  Code Status   Code Status: Full Code  Home/SNF/Other Home Patient oriented to: self, place, time and situation Is this baseline? Yes   Triage Complete: Triage complete  Chief Complaint Weakness  Triage Note Sent to ED due to possible kidney faillure.  Pt's wife states patient has stage VI kidney failure, dehydration, hypokalemia.  Patient is AAOx3   Allergies Allergies  Allergen Reactions  . Ferrous Gluconate Nausea And Vomiting    Level of Care/Admitting Diagnosis ED Disposition    ED Disposition Condition Comment   Admit  Hospital Area: [facility] [[id number]]  Level of Care: Med-Surg [16]  Covid Evaluation: Asymptomatic Screening Protocol (No Symptoms)  Diagnosis: ARF (acute renal failure) [facility]) [id number]  Admitting Physician: Jani Gravel [[id number]]  Attending Physician: Jani Gravel [[id number]]  PT Class (Do Not Modify): Observation [104]  PT Acc Code (Do Not Modify): Observation [10022]       B Medical/Surgery History Past Medical History:  Diagnosis Date  . Chronic kidney disease   . Diabetes mellitus without complication ([place])   . ETOH abuse   . Hyperlipidemia   . Hypertension   . Stroke [facility])    Past Surgical History:  Procedure Laterality Date  . COLONOSCOPY WITH PROPOFOL N/A 03/06/2019   Procedure: COLONOSCOPY WITH PROPOFOL;  Surgeon: Lucilla Lame, MD;  Location: [facility] ENDOSCOPY;  Service: Endoscopy;  Laterality: N/A;  . ESOPHAGOGASTRODUODENOSCOPY (EGD) WITH PROPOFOL N/A 07/01/2016   Procedure: ESOPHAGOGASTRODUODENOSCOPY (EGD) WITH PROPOFOL;  Surgeon: San Jetty, MD;  Location: [place] ENDOSCOPY;  Service: General;  Laterality: N/A;     A IV Location/Drains/Wounds Patient Lines/Drains/Airways Status   Active Line/Drains/Airways    Name:   Placement date:    Placement time:   Site:   Days:   Peripheral IV 04/08/19 Left Forearm   04/08/19    2203    Forearm   1          Intake/Output Last 24 hours  Intake/Output Summary (Last 24 hours) at 04/09/2019 0348 Last data filed at 04/09/2019 0315 Gross per 24 hour  Intake 100 ml  Output -  Net 100 ml    Labs/Imaging Results for orders placed or performed during the hospital encounter of 04/08/19 (from the past 48 hour(s))  Basic metabolic panel     Status: Abnormal   Collection Time: 04/08/19  4:57 PM  Result Value Ref Range   Sodium 135 135 - 145 mmol/L   Potassium 3.4 (L) 3.5 - 5.1 mmol/L   Chloride 109 98 - 111 mmol/L   CO2 14 (L) 22 - 32 mmol/L   Glucose, Bld 294 (H) 70 - 99 mg/dL   BUN 44 (H) 6 - 20 mg/dL   Creatinine, Ser 6.31 (H) 0.61 - 1.24 mg/dL   Calcium 7.3 (L) 8.9 - 10.3 mg/dL   GFR calc non Af Amer 9 (L) >60 mL/min   GFR calc Af Amer 10 (L) >60 mL/min   Anion gap 12 5 - 15    Comment: Performed at [facility], [address] [place], [place] [address]  CBC     Status: Abnormal   Collection Time: 04/08/19  4:57 PM  Result Value Ref Range   WBC 7.2 4.0 - 10.5 K/uL   RBC 3.33 (L) 4.22 - 5.81 MIL/uL   Hemoglobin 9.9 (L) 13.0 -  17.0 g/dL   HCT 31.3 (L) 39.0 - 52.0 %   MCV 94.0 80.0 - 100.0 fL   MCH 29.7 26.0 - 34.0 pg   MCHC 31.6 30.0 - 36.0 g/dL   RDW 13.2 11.5 - 15.5 %   Platelets 314 150 - 400 K/uL   nRBC 0.0 0.0 - 0.2 %    Comment: Performed at [facility], [address], [place], [place] [address]  Magnesium     Status: None   Collection Time: 04/08/19  4:57 PM  Result Value Ref Range   Magnesium 1.7 1.7 - 2.4 mg/dL    Comment: Performed at [facility], [address] [place], [place] [address]  Urinalysis, Complete w Microscopic     Status: Abnormal   Collection Time: 04/09/19  1:23 AM  Result Value Ref Range   Color, Urine YELLOW (A) YELLOW   APPearance HAZY (A) CLEAR   Specific Gravity, Urine 1.012 1.005 - 1.030   pH  5.0 5.0 - 8.0   Glucose, UA >=500 (A) NEGATIVE mg/dL   Hgb urine dipstick SMALL (A) NEGATIVE   Bilirubin Urine NEGATIVE NEGATIVE   Ketones, ur NEGATIVE NEGATIVE mg/dL   Protein, ur >=300 (A) NEGATIVE mg/dL   Nitrite NEGATIVE NEGATIVE   Leukocytes,Ua TRACE (A) NEGATIVE   RBC / HPF 0-5 0 - 5 RBC/hpf   WBC, UA 21-50 0 - 5 WBC/hpf   Bacteria, UA NONE SEEN NONE SEEN   Squamous Epithelial / LPF 0-5 0 - 5   Mucus PRESENT    Hyaline Casts, UA PRESENT    Amorphous Crystal PRESENT     Comment: Performed at [facility], [address] [place], [place] [address]  Creatinine, serum     Status: Abnormal   Collection Time: 04/09/19  2:41 AM  Result Value Ref Range   Creatinine, Ser 6.32 (H) 0.61 - 1.24 mg/dL   GFR calc non Af Amer 9 (L) >60 mL/min   GFR calc Af Amer 10 (L) >60 mL/min    Comment: Performed at [facility], [address] [place], [place] [address]   No results found.  Pending Labs Unresulted Labs (From admission, onward)    Start     Ordered   04/09/19 0500  Comprehensive metabolic panel  Tomorrow morning,   STAT     04/09/19 0207   04/09/19 0500  CBC  Tomorrow morning,   STAT     04/09/19 0207   04/09/19 0500  CK  Tomorrow morning,   STAT     04/09/19 0207   04/08/19 2240  SARS CORONAVIRUS 2 (TAT 6-24 HRS) Nasopharyngeal Nasopharyngeal Swab  (Asymptomatic/Tier 3)  Once,   STAT    Question Answer Comment  Is this test for diagnosis or screening Screening   Symptomatic for COVID-19 as defined by CDC No   Hospitalized for COVID-19 No   Admitted to ICU for COVID-19 No   Previously tested for COVID-19 Yes   Resident in a congregate (group) care setting No   Employed in healthcare setting No      04/08/19 2239          Vitals/Pain Today's Vitals   04/08/19 1654 04/08/19 2133 04/08/19 2137 04/09/19 0133  BP: 103/70 131/89  (!) 144/86  Pulse: 70 66  72  Resp: 15 16  16   Temp: 97.7 F (36.5 C) 98.5 F (36.9 C)    TempSrc: Oral Oral    SpO2:  99% 100% 100% 99%  Weight:  Height:      PainSc:  0-No pain  0-No pain    Isolation Precautions No active isolations  Medications Medications  sodium bicarbonate 150 mEq in sterile water 1,000 mL infusion ( Intravenous New Bag/Given 04/08/19 2211)  potassium chloride 10 mEq in 100 mL IVPB (10 mEq Intravenous New Bag/Given 04/09/19 0318)  heparin injection 5,000 Units (has no administration in time range)  acetaminophen (TYLENOL) tablet 650 mg (has no administration in time range)    Or  acetaminophen (TYLENOL) suppository 650 mg (has no administration in time range)  ondansetron (ZOFRAN) injection 4 mg (has no administration in time range)  insulin aspart (novoLOG) injection 0-6 Units (has no administration in time range)  insulin aspart (novoLOG) injection 0-5 Units (has no administration in time range)    Mobility walks with device Low fall risk   Focused Assessments Renal Assessment Handoff:  Hemodialysis Schedule: Pt is not currently a dialysis patient (pt states he will refuse) Last Hemodialysis date and time:    Restricted appendage:      R Recommendations: See Admitting Provider Note  Report given to:   Additional Notes: None

## 2019-04-09 NOTE — H&P (Addendum)
TRH H&P    Patient Demographics:    [name], is a 58 y.o. male  MRN: [id number]  DOB - November 01, 1960  Admit Date - 04/08/2019  Referring MD/NP/PA:  Lindell Noe  Outpatient Primary MD for the patient is [name], MD  Patient coming from:  home  Chief complaint- dehydration   HPI:    [name]  is a 58 y.o. 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  Pt denies nsaid use or recent iv dye exposure.  Pt states that he had rash and was seen by Dr. Marcelino Scot at [facility] and had labs and sent to ER for evaluation of ARF  Prior spep negative for M spike 07/26/2018.     In ED,  T 97.7, P 70 R 15, Bp 103/70 pox 99% on RA Wt 66.9kg  Na 135, K 3.4,  Bun 44, Creatinine 6.31 Wbc 7.2, hgb 9.9, Plt 314 Magnesium 1.7 urinalysis wbc 21-50, prot >300  covid -19 pending  Pt will be admitted for acute on chronic renal failure     Review of systems:    In addition to the HPI above,  No Fever-chills, No Headache, No changes with Vision or hearing, No problems swallowing food or Liquids, No Chest pain, Cough or Shortness of Breath, No Abdominal pain, No Nausea or Vomiting, bowel movements are regular, No Blood in stool or Urine, No dysuria, No new skin rashes or bruises, No new joints pains-aches,  No new weakness, tingling, numbness in any extremity, No recent weight gain or loss, No polyuria, polydypsia or polyphagia, No significant Mental Stressors.  All other systems reviewed and are negative.    Past History of the following :    Past Medical History:  Diagnosis Date  . Chronic kidney disease   . Diabetes mellitus without complication ([place])   . ETOH abuse   . Hyperlipidemia   . Hypertension   . Stroke [facility])       Past Surgical History:  Procedure Laterality Date   . COLONOSCOPY WITH PROPOFOL N/A 03/06/2019   Procedure: COLONOSCOPY WITH PROPOFOL;  Surgeon: Lucilla Lame, MD;  Location: [facility] ENDOSCOPY;  Service: Endoscopy;  Laterality: N/A;  . ESOPHAGOGASTRODUODENOSCOPY (EGD) WITH PROPOFOL N/A 07/01/2016   Procedure: ESOPHAGOGASTRODUODENOSCOPY (EGD) WITH PROPOFOL;  Surgeon: San Jetty, MD;  Location: [facility] ENDOSCOPY;  Service: General;  Laterality: N/A;      Social History:      Social History   Tobacco Use  . Smoking status: Current Every Day Smoker    Packs/day: 0.25    Years: 5.00    Pack years: 1.25    Types: Cigars  . Smokeless tobacco: Never Used  Substance Use Topics  . Alcohol use: Yes    Alcohol/week: 16.0 standard drinks    Types: 2 Shots of liquor, 14 Cans of beer per week       Family History :     Family History  Problem Relation Age of Onset  .  CAD Brother   . Dementia Mother   . Renal Disease Father        Home Medications:   Prior to Admission medications   Medication Sig Start Date End Date Taking? Authorizing Provider  amLODipine (NORVASC) 2.5 MG tablet Take 2.5 mg by mouth daily. 04/08/19   [provider]  folic acid (FOLVITE) 1 MG tablet Take 1 mg by mouth daily. 03/12/19   [provider]  gabapentin (NEURONTIN) 300 MG capsule Take 1 capsule (300 mg total) by mouth 2 (two) times daily. Patient taking differently: Take 300 mg by mouth 3 (three) times daily.  07/27/18   Bettey Costa, MD  hydrocortisone 2.5 % cream  04/08/19   [provider]  insulin glargine (LANTUS) 100 UNIT/ML injection Inject 0.15 mLs (15 Units total) into the skin daily. Patient taking differently: Inject 8 Units into the skin daily.  07/27/18   Bettey Costa, MD  metoprolol tartrate (LOPRESSOR) 50 MG tablet Take 50 mg by mouth 2 (two) times daily. 02/16/19 02/16/20  [provider]  metroNIDAZOLE (FLAGYL) 500 MG tablet Take 500 mg by mouth 3 (three) times daily. 03/04/19   [provider]   pantoprazole (PROTONIX) 40 MG tablet Take 40 mg by mouth daily. 12/18/18   [provider]  sertraline (ZOLOFT) 100 MG tablet Take 100 mg by mouth daily. 04/08/19   [provider]  sodium bicarbonate 650 MG tablet Take 650 mg by mouth 2 (two) times daily. 03/01/19   [provider]  torsemide (DEMADEX) 20 MG tablet  03/25/19   [provider]     Allergies:     Allergies  Allergen Reactions  . Ferrous Gluconate Nausea And Vomiting     Physical Exam:   Vitals  Blood pressure (!) 144/86, pulse 72, temperature 98.5 F (36.9 C), temperature source Oral, resp. rate 16, height 5\' 11"  (1.803 m), weight 66.9 kg, SpO2 99 %.  1.  General: axoxo3  2. Psychiatric: euthymic  3. Neurologic: nonfocal  4. HEENMT:  Anicteric, pupils 1.22mm symmetric, direct, consensual, near intact Neck: no jvd  5. Respiratory : CTAB  6. Cardiovascular : rrr s1, s2, no m/g/r  7. Gastrointestinal:  Abd: soft, nt, nd, +bs  8. Skin:  Ext: no c/c/e,  Slight scarring from prior vesicular rash on the left upper back probably was shingles.   9.Musculoskeletal:  Good ROM    Data Review:    CBC Recent Labs  Lab 04/08/19 1657  WBC 7.2  HGB 9.9*  HCT 31.3*  PLT 314  MCV 94.0  MCH 29.7  MCHC 31.6  RDW 13.2   ------------------------------------------------------------------------------------------------------------------  Results for orders placed or performed during the hospital encounter of 04/08/19 (from the past 48 hour(s))  Basic metabolic panel     Status: Abnormal   Collection Time: 04/08/19  4:57 PM  Result Value Ref Range   Sodium 135 135 - 145 mmol/L   Potassium 3.4 (L) 3.5 - 5.1 mmol/L   Chloride 109 98 - 111 mmol/L   CO2 14 (L) 22 - 32 mmol/L   Glucose, Bld 294 (H) 70 - 99 mg/dL   BUN 44 (H) 6 - 20 mg/dL   Creatinine, Ser 6.31 (H) 0.61 - 1.24 mg/dL   Calcium 7.3 (L) 8.9 - 10.3 mg/dL   GFR calc non Af Amer 9 (L) >60 mL/min   GFR calc Af  Amer 10 (L) >60 mL/min   Anion gap 12 5 - 15    Comment: Performed  at [facility], [address] [place], [place] [address]  CBC     Status: Abnormal   Collection Time: 04/08/19  4:57 PM  Result Value Ref Range   WBC 7.2 4.0 - 10.5 K/uL   RBC 3.33 (L) 4.22 - 5.81 MIL/uL   Hemoglobin 9.9 (L) 13.0 - 17.0 g/dL   HCT 31.3 (L) 39.0 - 52.0 %   MCV 94.0 80.0 - 100.0 fL   MCH 29.7 26.0 - 34.0 pg   MCHC 31.6 30.0 - 36.0 g/dL   RDW 13.2 11.5 - 15.5 %   Platelets 314 150 - 400 K/uL   nRBC 0.0 0.0 - 0.2 %    Comment: Performed at [facility], [address] [place], [place] [address]  Magnesium     Status: None   Collection Time: 04/08/19  4:57 PM  Result Value Ref Range   Magnesium 1.7 1.7 - 2.4 mg/dL    Comment: Performed at [facility], [address] [place], [place] [address]  Urinalysis, Complete w Microscopic     Status: Abnormal   Collection Time: 04/09/19  1:23 AM  Result Value Ref Range   Color, Urine YELLOW (A) YELLOW   APPearance HAZY (A) CLEAR   Specific Gravity, Urine 1.012 1.005 - 1.030   pH 5.0 5.0 - 8.0   Glucose, UA >=500 (A) NEGATIVE mg/dL   Hgb urine dipstick SMALL (A) NEGATIVE   Bilirubin Urine NEGATIVE NEGATIVE   Ketones, ur NEGATIVE NEGATIVE mg/dL   Protein, ur >=300 (A) NEGATIVE mg/dL   Nitrite NEGATIVE NEGATIVE   Leukocytes,Ua TRACE (A) NEGATIVE   RBC / HPF 0-5 0 - 5 RBC/hpf   WBC, UA 21-50 0 - 5 WBC/hpf   Bacteria, UA NONE SEEN NONE SEEN   Squamous Epithelial / LPF 0-5 0 - 5   Mucus PRESENT    Hyaline Casts, UA PRESENT    Amorphous Crystal PRESENT     Comment: Performed at [facility], [address] [place], [place] [address]    Chemistries  Recent Labs  Lab 04/08/19 1657  NA 135  K 3.4*  CL 109  CO2 14*  GLUCOSE 294*  BUN 44*  CREATININE 6.31*  CALCIUM 7.3*  MG 1.7    ------------------------------------------------------------------------------------------------------------------  ------------------------------------------------------------------------------------------------------------------ GFR: Estimated Creatinine Clearance: 11.9 mL/min (A) (by C-G formula based on SCr of 6.31 mg/dL (H)). Liver Function Tests: No results for input(s): AST, ALT, ALKPHOS, BILITOT, PROT, ALBUMIN in the last 168 hours. No results for input(s): LIPASE, AMYLASE in the last 168 hours. No results for input(s): AMMONIA in the last 168 hours. Coagulation Profile: No results for input(s): INR, PROTIME in the last 168 hours. Cardiac Enzymes: No results for input(s): CKTOTAL, CKMB, CKMBINDEX, TROPONINI in the last 168 hours. BNP (last 3 results) No results for input(s): PROBNP in the last 8760 hours. HbA1C: No results for input(s): HGBA1C in the last 72 hours. CBG: No results for input(s): GLUCAP in the last 168 hours. Lipid Profile: No results for input(s): CHOL, HDL, LDLCALC, TRIG, CHOLHDL, LDLDIRECT in the last 72 hours. Thyroid Function Tests: No results for input(s): TSH, T4TOTAL, FREET4, T3FREE, THYROIDAB in the last 72 hours. Anemia Panel: No results for input(s): VITAMINB12, FOLATE, FERRITIN, TIBC, IRON, RETICCTPCT in the last 72 hours.  --------------------------------------------------------------------------------------------------------------- Urine analysis:    Component Value Date/Time   COLORURINE YELLOW (A) 04/09/2019 0123   APPEARANCEUR HAZY (A) 04/09/2019 0123   APPEARANCEUR Clear 01/17/2014 1551   LABSPEC 1.012 04/09/2019 0123  LABSPEC 1.011 01/17/2014 1551   PHURINE 5.0 04/09/2019 0123   GLUCOSEU >=500 (A) 04/09/2019 0123   GLUCOSEU Negative 01/17/2014 1551   HGBUR SMALL (A) 04/09/2019 0123   BILIRUBINUR NEGATIVE 04/09/2019 0123   BILIRUBINUR Negative 01/17/2014 1551   KETONESUR NEGATIVE 04/09/2019 0123   PROTEINUR >=300 (A) 04/09/2019  0123   NITRITE NEGATIVE 04/09/2019 0123   LEUKOCYTESUR TRACE (A) 04/09/2019 0123   LEUKOCYTESUR 1+ 01/17/2014 1551      Imaging Results:    No results found.     Assessment & Plan:    Principal Problem:   ARF (acute renal failure) (HCC) Active Problems:   Acute renal failure superimposed on stage 3 chronic kidney disease (HCC)   Uncontrolled diabetes mellitus (HCC)   HTN (hypertension)  Acute renal failure on CKD stage3/4 STOP Demadex Hydrate with sodium bicarb Prior renal ultrasound negative Prior spep negative M-spike Check cmp in am  Non AG acidosis secondary to diarrhea (chronic for the past 1 month, per patient) Hydrate with  W Sodium bicarb  Check cmp in am  Hypokalemia Check cmp in am  Dm2 fsbs ac and qhs, ISS  Diabetic neuropathy STOP Gabapentin due to renal insufficiency for now Max dosing wound be 300mg  po qday there have been reports of renal sensitivity  CKD stage 4 Cont sodium bicarb 650mg  po qday  Hypertension Cont Amlodipine 2.5mg  po qday STOP Demadex 20mg  po qday  Gerd Cont PPI  Anxiety Cont Zoloft 100mg  po qday     DVT Prophylaxis-   heparin - SCDs   AM Labs Ordered, also please review Full Orders  Family Communication: Admission, patients condition and plan of care including tests being ordered have been discussed with the patient  who indicate understanding and agree with the plan and Code Status.  Code Status:  FULL CODE per patient  Admission status: Observation: Based on patients clinical presentation and evaluation of above clinical data, I have made determination that patient meets observation criteria at this time.    Time spent in minutes : 70 minutes   Jani Gravel M.D on 04/09/2019 at 1:41 AM

## 2019-04-10 ENCOUNTER — Encounter
Admission: EM | Disposition: A | Discharge status: Home / Self Care | Payer: Self-pay | Source: Home / Self Care | Attending: Internal Medicine

## 2019-04-10 DIAGNOSIS — Z992 Dependence on renal dialysis: Secondary | ICD-10-CM

## 2019-04-10 DIAGNOSIS — E860 Dehydration: Secondary | ICD-10-CM

## 2019-04-10 DIAGNOSIS — N186 End stage renal disease: Secondary | ICD-10-CM

## 2019-04-10 DIAGNOSIS — E7800 Pure hypercholesterolemia, unspecified: Secondary | ICD-10-CM

## 2019-04-10 HISTORY — PX: DIALYSIS/PERMA CATHETER INSERTION: CATH118288

## 2019-04-10 LAB — BASIC METABOLIC PANEL
Anion gap: 10 (ref 5–15)
BUN: 49 mg/dL — ABNORMAL HIGH (ref 6–20)
CO2: 18 mmol/L — ABNORMAL LOW (ref 22–32)
Calcium: 7.1 mg/dL — ABNORMAL LOW (ref 8.9–10.3)
Chloride: 109 mmol/L (ref 98–111)
Creatinine, Ser: 5.78 mg/dL — ABNORMAL HIGH (ref 0.61–1.24)
GFR calc Af Amer: 11 mL/min — ABNORMAL LOW (ref >60)
GFR calc non Af Amer: 10 mL/min — ABNORMAL LOW (ref >60)
Glucose, Bld: 84 mg/dL (ref 70–99)
Potassium: 2.8 mmol/L — ABNORMAL LOW (ref 3.5–5.1)
Sodium: 137 mmol/L (ref 135–145)

## 2019-04-10 LAB — GLUCOSE, CAPILLARY
Glucose-Capillary: 44 mg/dL — CL (ref 70–99)
Glucose-Capillary: 70 mg/dL (ref 70–99)
Glucose-Capillary: 112 mg/dL — ABNORMAL HIGH (ref 70–99)
Glucose-Capillary: 193 mg/dL — ABNORMAL HIGH (ref 70–99)
Glucose-Capillary: 276 mg/dL — ABNORMAL HIGH (ref 70–99)

## 2019-04-10 LAB — MAGNESIUM: Magnesium: 1.6 mg/dL — ABNORMAL LOW (ref 1.7–2.4)

## 2019-04-10 LAB — HEPATITIS B SURFACE ANTIBODY,QUALITATIVE: Hep B S Ab: NONREACTIVE

## 2019-04-10 LAB — HEPATITIS B SURFACE ANTIGEN: Hepatitis B Surface Ag: NONREACTIVE

## 2019-04-10 SURGERY — DIALYSIS/PERMA CATHETER INSERTION
Anesthesia: Moderate Sedation

## 2019-04-10 MED ORDER — FENTANYL CITRATE (PF) 100 MCG/2ML IJ SOLN
INTRAMUSCULAR | Status: AC
  Filled 2019-04-10: qty 2

## 2019-04-10 MED ORDER — HEPARIN SODIUM (PORCINE) 10000 UNIT/ML IJ SOLN
INTRAMUSCULAR | Status: AC
  Filled 2019-04-10: qty 1

## 2019-04-10 MED ORDER — POTASSIUM CHLORIDE 10 MEQ/100ML IV SOLN
10.0000 meq | INTRAVENOUS | Status: AC
  Administered 2019-04-10: 10 meq via INTRAVENOUS
  Filled 2019-04-10: qty 100

## 2019-04-10 MED ORDER — MAGNESIUM SULFATE IN D5W 1-5 GM/100ML-% IV SOLN
1.0000 g | Freq: Once | INTRAVENOUS | Status: AC
  Administered 2019-04-10: 1 g via INTRAVENOUS
  Filled 2019-04-10: qty 100

## 2019-04-10 MED ORDER — FENTANYL CITRATE (PF) 100 MCG/2ML IJ SOLN
INTRAMUSCULAR | Status: DC | PRN
  Administered 2019-04-10: 25 ug via INTRAVENOUS

## 2019-04-10 MED ORDER — CHLORHEXIDINE GLUCONATE CLOTH 2 % EX PADS
6.0000 | MEDICATED_PAD | Freq: Every day | CUTANEOUS | Status: DC
  Administered 2019-04-12 – 2019-04-15 (×4): 6 via TOPICAL

## 2019-04-10 MED ORDER — CEFAZOLIN SODIUM-DEXTROSE 1-4 GM/50ML-% IV SOLN
1.0000 g | Freq: Once | INTRAVENOUS | Status: AC
  Administered 2019-04-10: 1 g via INTRAVENOUS

## 2019-04-10 SURGICAL SUPPLY — 8 items
ADH SKN CLS APL DERMABOND .7 (GAUZE/BANDAGES/DRESSINGS) ×1
CANNULA 5F STIFF (CANNULA) ×3 IMPLANT
CATH PALINDROME RT-P 15FX19CM (CATHETERS) ×3 IMPLANT
DERMABOND ADVANCED (GAUZE/BANDAGES/DRESSINGS) ×2
DERMABOND ADVANCED .7 DNX12 (GAUZE/BANDAGES/DRESSINGS) ×1 IMPLANT
PACK ANGIOGRAPHY (CUSTOM PROCEDURE TRAY) ×3 IMPLANT
SUT MNCRL AB 4-0 PS2 18 (SUTURE) ×3 IMPLANT
SUT PROLENE 0 CT 1 30 (SUTURE) ×3 IMPLANT

## 2019-04-10 NOTE — OR Nursing (Signed)
Potassium and magnesium drip suspended for perm cath insertion, ns at [facility] and Ancef 1 gram infusing. Pt ate lunch so only fentanyl and local anesthetic given during procedure.

## 2019-04-10 NOTE — Progress Notes (Signed)
[facility]  ROUNDING NOTE   Subjective:  Patient well-known to [place] from the office. He has underlying chronic kidney disease stage V. Presents now to the hospital with an EGFR of 11. My partner Dr. [name] has had extensive discussion regarding renal replacement therapy. It appears that he is favoring hemodialysis at this time. We had additional discussion today and patient is willing to move forward with renal placement therapy at this time. PermCath has been placed and we appreciate vascular surgery assistance today.   Objective:  Vital signs in last 24 hours:  Temp:  [97.9 F (36.6 C)-98.9 F (37.2 C)] 97.9 F (36.6 C) (11/27 1404) Pulse Rate:  [54-75] 69 (11/27 1516) Resp:  [14-20] 16 (11/27 1516) BP: (114-155)/(74-86) 129/76 (11/27 1516) SpO2:  [93 %-100 %] 97 % (11/27 1516)  Weight change:  Filed Weights   04/08/19 1651 04/09/19 0455  Weight: 66.9 kg 61.8 kg    Intake/Output: I/O last 3 completed shifts: In: 2346.6 [P.O.:720; I.V.:1526.6; IV Piggyback:100] Out: 275 [Urine:275]   Intake/Output this shift:  Total I/O In: 240 [P.O.:240] Out: 100 [Urine:100]  Physical Exam: General: No acute distress  Head: Normocephalic, atraumatic. Moist oral mucosal membranes  Eyes: Anicteric  Neck: Supple, trachea midline  Lungs:  Clear to auscultation, normal effort  Heart: S1S2 no rubs  Abdomen:  Soft, nontender, bowel sounds present  Extremities: 1+ peripheral edema.  Neurologic: Awake, alert, following commands  Skin: No lesions  Access: IJ PC    Basic Metabolic Panel: Recent Labs  Lab 04/08/19 1657 04/09/19 0241 04/09/19 0529 04/10/19 0859  NA 135  --  136 137  K 3.4*  --  3.3* 2.8*  CL 109  --  106 109  CO2 14*  --  21* 18*  GLUCOSE 294*  --  460* 84  BUN 44*  --  49* 49*  CREATININE 6.31* 6.32* 6.13* 5.78*  CALCIUM 7.3*  --  7.0* 7.1*  MG 1.7  --   --  1.6*    Liver Function Tests: Recent Labs  Lab 04/09/19 0529  AST 9*  ALT 7   ALKPHOS 99  BILITOT 0.4  PROT 6.6  ALBUMIN 2.0*   No results for input(s): LIPASE, AMYLASE in the last 168 hours. No results for input(s): AMMONIA in the last 168 hours.  CBC: Recent Labs  Lab 04/08/19 1657 04/09/19 0529  WBC 7.2 6.2  HGB 9.9* 8.5*  HCT 31.3* 25.2*  MCV 94.0 89.4  PLT 314 281    Cardiac Enzymes: Recent Labs  Lab 04/09/19 0529  CKTOTAL 37*    BNP: Invalid input(s): POCBNP  CBG: Recent Labs  Lab 04/09/19 1633 04/09/19 2110 04/10/19 0809 04/10/19 0848 04/10/19 1146  GLUCAP 183* 162* 44* 70 112*    Microbiology: Results for orders placed or performed during the hospital encounter of 04/08/19  SARS CORONAVIRUS 2 (TAT 6-24 HRS) Nasopharyngeal Nasopharyngeal Swab     Status: None   Collection Time: 04/08/19 10:49 PM   Specimen: Nasopharyngeal Swab  Result Value Ref Range Status   SARS Coronavirus 2 NEGATIVE NEGATIVE Final    Comment: (NOTE) SARS-CoV-2 target nucleic acids are NOT DETECTED. The SARS-CoV-2 RNA is generally detectable in upper and lower respiratory specimens during the acute phase of infection. Negative results do not preclude SARS-CoV-2 infection, do not rule out co-infections with other pathogens, and should not be used as the sole basis for treatment or other patient management decisions. Negative results must be combined with clinical observations, patient history,  and epidemiological information. The expected result is Negative. Fact Sheet for Patients: SugarRoll.be Fact Sheet for Healthcare Providers: https://www.woods-mathews.com/ This test is not yet approved or cleared by the [place] FDA and  has been authorized for detection and/or diagnosis of SARS-CoV-2 by FDA under an Emergency Use Authorization (EUA). This EUA will remain  in effect (meaning this test can be used) for the duration of the COVID-19 declaration under Section 56 4(b)(1) of the Act, 21 U.S.C. section  360bbb-3(b)(1), unless the authorization is terminated or revoked sooner. Performed at [facility], [place] [address], [place], [place] [address]     Coagulation Studies: No results for input(s): LABPROT, INR in the last 72 hours.  Urinalysis: Recent Labs    04/09/19 0123  COLORURINE YELLOW*  LABSPEC 1.012  PHURINE 5.0  GLUCOSEU >=500*  HGBUR SMALL*  BILIRUBINUR NEGATIVE  KETONESUR NEGATIVE  PROTEINUR >=300*  NITRITE NEGATIVE  LEUKOCYTESUR TRACE*      Imaging: No results found.   Medications:   . sodium chloride Stopped (04/10/19 1500)   . amLODipine  2.5 mg Oral Daily  . [START ON 04/11/2019] Chlorhexidine Gluconate Cloth  6 each Topical Q0600  . fentaNYL      . folic acid  1 mg Oral Daily  . heparin      . heparin  5,000 Units Subcutaneous Q8H  . insulin aspart  0-5 Units Subcutaneous QHS  . insulin aspart  0-6 Units Subcutaneous TID WC  . insulin glargine  8 Units Subcutaneous Daily  . metoprolol tartrate  50 mg Oral BID  . pantoprazole  40 mg Oral Daily  . sertraline  100 mg Oral Daily  . sodium bicarbonate  650 mg Oral BID   acetaminophen **OR** acetaminophen, ondansetron (ZOFRAN) IV  Assessment/ Plan:  58 y.o. male with longstanding poorly controlled diabetes, hypertension, chronic kidney disease and proteinuria Underlying chronic kidney disease is most likely secondary to diabetes.   1.  ESRD not yet on HD.  Patient followed closely by my partner Dr. [name] in the office.  eGFR currently quite low at 11.  We talked in depth about renal placement therapy today and patient willing to move forward with hemodialysis at this time.  Appreciate vascular surgery assistance with PermCath placement.  We will plan for first dialysis treatment today with blood flow rate of 150, dialysate flow rate of 300, and treatment time of 1.5 hours today.  We will plan for an additional treatment tomorrow as well.  2.  Anemia of chronic kidney disease.  Once he has an  established dialysis schedule we will institute Epogen.  3.  Secondary hyperparathyroidism.  We will check intact PTH and phosphorus during dialysis treatment today.  4.  Hypokalemia.  Serum potassium low at 2.8.  We will dialyze the patient against a 4K bath.  5.  Metabolic acidosis.  Okay to continue sodium bicarb in for now but we will likely be able to discontinue this once he is started dialysis.   LOS: 1 Ramiah Helfrich 11/27/20203:33 PM

## 2019-04-10 NOTE — Progress Notes (Signed)
   04/10/19 1910  Neurological  Level of Consciousness Alert  Orientation Level Oriented X4  Respiratory  Respiratory Pattern Regular;Unlabored  Bilateral Breath Sounds Clear;Diminished  Cough Non-productive  Cardiac  Pulse Regular  Heart Sounds S1, S2  ECG Monitor Yes  PT STABLE FOR HD TX VITALS STABLE CVC WDL PT EDUCATED ON 1ST TX ORDERS NO C/OS VOICED

## 2019-04-10 NOTE — Consult Note (Signed)
[facility] [place]  Patient name: [name] MRN: [id number] DOB: March 28, 1961 Sex: male   REQUESTING PROVIDER:    Renal    REASON FOR CONSULT:    dialysis access  HISTORY OF PRESENT ILLNESS:   [name] is a 58 y.o. male, who was admitted with dehydration on 04-09-2019.  He has worsening renal failure and is at the point of starting dialysis.  He suffers from DM, HTN, and hypercholesterolemia and has had a stroke in the past  PAST MEDICAL HISTORY    Past Medical History:  Diagnosis Date  . Chronic kidney disease   . Diabetes mellitus without complication ([place])   . ETOH abuse   . Hyperlipidemia   . Hypertension   . Stroke [facility])      FAMILY HISTORY   Family History  Problem Relation Age of Onset  . CAD Brother   . Dementia Mother   . Renal Disease Father     SOCIAL HISTORY:   Social History   Socioeconomic History  . Marital status: Married    Spouse name: Not on file  . Number of children: Not on file  . Years of education: Not on file  . Highest education level: Not on file  Occupational History  . Not on file  Social Needs  . Financial resource strain: Not hard at all  . Food insecurity    Worry: Never true    Inability: Never true  . Transportation needs    Medical: No    Non-medical: No  Tobacco Use  . Smoking status: Current Every Day Smoker    Packs/day: 0.25    Years: 5.00    Pack years: 1.25    Types: Cigars  . Smokeless tobacco: Never Used  Substance and Sexual Activity  . Alcohol use: Yes    Alcohol/week: 16.0 standard drinks    Types: 2 Shots of liquor, 14 Cans of beer per week  . Drug use: No  . Sexual activity: Yes  Lifestyle  . Physical activity    Days per week: 5 days    Minutes per session: 30 min  . Stress: Not at all  Relationships  . Social Herbalist on phone: Once a week    Gets together: Once a week    Attends religious service: Never   Active member of club or organization: No    Attends meetings of clubs or organizations: Never    Relationship status: Married  . Intimate partner violence    Fear of current or ex partner: No    Emotionally abused: No    Physically abused: No    Forced sexual activity: No  Other Topics Concern  . Not on file  Social History Narrative   Lives with wife, works at [employer]:    Allergies  Allergen Reactions  . Ferrous Gluconate Nausea And Vomiting    CURRENT MEDICATIONS:    Current Facility-Administered Medications  Medication Dose Route Frequency Provider Last Rate Last Dose  . 0.9 %  sodium chloride infusion   Intravenous Continuous Marcell Anger, MD 100 mL/hr at 04/10/19 0902    . acetaminophen (TYLENOL) tablet 650 mg  650 mg Oral Q6H PRN Jani Gravel, MD       Or  . acetaminophen (TYLENOL) suppository 650 mg  650 mg Rectal Q6H PRN Jani Gravel, MD      . amLODipine (NORVASC) tablet 2.5 mg  2.5 mg Oral  Daily Jani Gravel, MD   2.5 mg at 04/10/19 0858  . folic acid (FOLVITE) tablet 1 mg  1 mg Oral Daily Jani Gravel, MD   1 mg at 04/10/19 0859  . heparin 10000 UNIT/ML injection           . heparin injection 5,000 Units  5,000 Units Subcutaneous Q8H Jani Gravel, MD   5,000 Units at 04/09/19 2253  . insulin aspart (novoLOG) injection 0-5 Units  0-5 Units Subcutaneous QHS Jani Gravel, MD      . insulin aspart (novoLOG) injection 0-6 Units  0-6 Units Subcutaneous TID WC Jani Gravel, MD   1 Units at 04/09/19 1728  . insulin glargine (LANTUS) injection 8 Units  8 Units Subcutaneous Daily Jani Gravel, MD   8 Units at 04/09/19 [phone]  . magnesium sulfate IVPB 1 g 100 mL  1 g Intravenous Once Marcell Anger, MD 100 mL/hr at 04/10/19 1342 1 g at 04/10/19 1342  . metoprolol tartrate (LOPRESSOR) tablet 50 mg  50 mg Oral BID Jani Gravel, MD   50 mg at 04/10/19 0857  . ondansetron (ZOFRAN) injection 4 mg  4 mg Intravenous Q6H PRN Jani Gravel, MD      . pantoprazole  (PROTONIX) EC tablet 40 mg  40 mg Oral Daily Jani Gravel, MD   40 mg at 04/10/19 0857  . potassium chloride 10 mEq in 100 mL IVPB  10 mEq Intravenous Q1 Hr x 2 Spongberg, Audie Pinto, MD 100 mL/hr at 04/10/19 1311 10 mEq at 04/10/19 1311  . sertraline (ZOLOFT) tablet 100 mg  100 mg Oral Daily Jani Gravel, MD   100 mg at 04/10/19 0859  . sodium bicarbonate tablet 650 mg  650 mg Oral BID Jani Gravel, MD   650 mg at 04/10/19 [id number]    REVIEW OF SYSTEMS:   [X]  denotes positive finding, [ ]  denotes negative finding Cardiac  Comments:  Chest pain or chest pressure:    Shortness of breath upon exertion:    Short of breath when lying flat:    Irregular heart rhythm:        Vascular    Pain in calf, thigh, or hip brought on by ambulation:    Pain in feet at night that wakes you up from your sleep:     Blood clot in your veins:    Leg swelling:         Pulmonary    Oxygen at home:    Productive cough:     Wheezing:         Neurologic    Sudden weakness in arms or legs:     Sudden numbness in arms or legs:     Sudden onset of difficulty speaking or slurred speech:    Temporary loss of vision in one eye:     Problems with dizziness:         Gastrointestinal    Blood in stool:      Vomited blood:         Genitourinary    Burning when urinating:     Blood in urine:        Psychiatric    Major depression:         Hematologic    Bleeding problems:    Problems with blood clotting too easily:        Skin    Rashes or ulcers:        Constitutional    Fever or chills:  PHYSICAL EXAM:   Vitals:   04/09/19 1923 04/10/19 0407 04/10/19 1149 04/10/19 1404  BP: 114/78 (!) 146/85 139/83 138/74  Pulse: 75 65 61 70  Resp: 20 20 16 15   Temp: 98.9 F (37.2 C) 98.2 F (36.8 C) 98.1 F (36.7 C) 97.9 F (36.6 C)  TempSrc: Oral Oral Oral Oral  SpO2: 98% 93% 97% 100%  Weight:      Height:        GENERAL: The patient is a well-nourished male, in no acute distress. The vital signs  are documented above. CARDIAC: There is a regular rate and rhythm.  PULMONARY: Nonlabored respirations ABDOMEN: Soft and non-tender with normal pitched bowel sounds.  MUSCULOSKELETAL: There are no major deformities or cyanosis. NEUROLOGIC: No focal weakness or paresthesias are detected. SKIN: There are no ulcers or rashes noted. PSYCHIATRIC: The patient has a normal affect.  STUDIES:   none  ASSESSMENT and PLAN   ESRD:  Discussed placing a tunneled dialysis catheter.  Risks and benefits reviewed.  All questions answered.  He will need arm access in the future   Annamarie Major, IV, MD, FACS [facility] [phone] Pager [phone]

## 2019-04-10 NOTE — Op Note (Signed)
    Patient name: [name] MRN: [id number] DOB: 02-02-1961 Sex: male  04/10/2019 Pre-operative Diagnosis: ESRD Post-operative diagnosis:  Same Surgeon:  Annamarie Major Assistants:  none Procedure:   Ultrasound guided placement of right internal jugular Pallindrome dialysis catheter (19cm) Anesthesia:  Local and 25 mcg fentanyl   Indications:  This is a 58 year old new start dialysis here for a ctheter  Procedure:  After obtaining informed consent, the patient was taken to the specials procedure room. The patient was placed in supine position on the  table.  The patient's entire neck and chest were prepped and draped in usual sterile fashion. [name] Ultrasound was used to identify the patient's right internal jugular vein. This had normal compressibility and respiratory variation. Local anesthesia was infiltrated over the right jugular vein.  Using ultrasound guidance, the right internal jugular vein was successfully cannulated with a micropuncture needle and wire.  A micropuncture sheath was inserted.  A 0.035 J-tipped guidewire was threaded into the sheath into the right internal jugular vein and into the superior vena cava followed by the inferior vena cava under fluoroscopic guidance.   Next sequential 12 and 14 dilators were placed over the guidewire into the right atrium.  A 16 French dilator with a peel-away sheath was then placed over the guidewire into the right atrium.   The guidewire and dilator were removed. A 19 cm Palindrome catheter was then placed through the peel away sheath into the right atrium.  The catheter was then tunneled subcutaneously, cut to length, and the hub attached. The catheter was noted to flush and draw easily. The catheter was inspected under fluoroscopy and found with its tip to be in the right atrium without any kinks throughout its course. The catheter was sutured to the skin with nylon sutures. The neck insertion site was closed with Vicryl stitch. The catheter was  then loaded with concentrated Heparin solution. A dry sterile dressing was applied.  The patient tolerated procedure well and there were no complications.      Theotis Burrow, M.D., [facility] [place] Office: [phone] Pager:  [phone]

## 2019-04-10 NOTE — Progress Notes (Signed)
PROGRESS NOTE    MICHAELE VERITY  [id number] DOB: 22-Jan-1961 DOA: 04/08/2019 PCP: [name], MD   Brief Narrative:  [name] y.o.male,w hypertension, hyperlipidemia, DM2, CKD stage3, ANA positive, h/o R MCA CVA w chronic bilateral ICA occlusion, presents with c/o dehydration. Pt denies nsaid use or recent iv dye exposure.  Pt states that he had rash and was seen by Dr. Marcelino Scot at [facility] and had labs and sent to ER for evaluation of ARF  Prior spep negative for M spike 07/26/2018.   Assessment & Plan:   Principal Problem:   ARF (acute renal failure) (HCC) Active Problems:   Acute renal failure superimposed on stage 3 chronic kidney disease (HCC)   Uncontrolled diabetes mellitus (HCC)   HTN (hypertension)   Acute renal failure (ARF) (HCC)   Acute on chronic CKD 3/4 renal failure: Cont holding Demadex, fluids with sodium bicarb tabs, Patient reported he had recent conversation with his nephrologist about likely needing dialysis Consult nephrology  We will continue hydration for 24 hours monitor creatinine Recheck BMP in the morning  Nonanion gap acidosis secondary to diarrhea This would be consistent with volume loss and bicarb loss explaining patient's acute renal failure as above. Continue fluid repletion Labs again in the morning Neg C. difficile and GI pathogen panel  Hypokalemia Monitor cautious repletion given patient's acute renal failure Recheck BMP in the morning  Diabetes type 2 Continue sliding scale insulin Check blood glucose AC at bedtime  Diabetic neuropathy Gabapentin was stopped secondary renal insufficiency Start pending recovery of renal function  Hypertension Agree with continuing amlodipine and stopping Demadex  GERD patient is on PPI which I will continue  Anxiety  Patient at baseline no acute decompensation Continue SSRI at 100 mg p.o. daily for Zoloft  DVT prophylaxis: SCD/Compression stockings   Code Status: full    Code Status Orders  (From admission, onward)         Start     Ordered   04/09/19 0207  Full code  Continuous     04/09/19 0207        Code Status History    Date Active Date Inactive Code Status Order ID Comments User Context   03/04/2019 2110 03/07/2019 1654 Full Code [id number]  Lang Snow, NP ED   07/25/2018 1842 07/27/2018 1502 Full Code [id number]  Henreitta Leber, MD Inpatient   02/26/2018 2332 03/04/2018 1859 Full Code [id number]  Lance Coon, MD Inpatient   01/15/2018 2158 01/16/2018 1909 Full Code [id number]  Henreitta Leber, MD Inpatient   08/17/2017 0000 08/21/2017 1532 Full Code [id number]  Lance Coon, MD ED   06/29/2016 2124 07/01/2016 1805 Full Code [id number]  Vaughan Basta, MD Inpatient   Advance Care Planning Activity     Family Communication:  None today Disposition Plan:   Patient remained inpatient for continued treatment for acute renal failure with metabolic acidosis, electrolyte disarray, requiring IV hydration, frequent electrolyte monitoring, and expert subspecialty consultation for consideration of dialysis. Consults called: Nephrology Admission status: Inpatient   Consultants:   Nephrology  Procedures:   No results found.  Antimicrobials:   None   Subjective: No acute events overnight Resting in bed, tolerating diet  Objective: Vitals:   04/09/19 1203 04/09/19 1923 04/10/19 0407 04/10/19 1149  BP: 116/88 114/78 (!) 146/85 139/83  Pulse: 78 75 65 61  Resp: 16 20 20 16   Temp: 99 F (37.2 C) 98.9 F (37.2 C) 98.2 F (36.8 C) 98.1 F (36.7 C)  TempSrc: Oral Oral Oral Oral  SpO2: 95% 98% 93% 97%  Weight:      Height:        Intake/Output Summary (Last 24 hours) at 04/10/2019 1312 Last data filed at 04/10/2019 0900 Gross per 24 hour  Intake 1487.29 ml  Output 275 ml  Net 1212.29 ml   Filed Weights   04/08/19 1651 04/09/19 0455  Weight: 66.9 kg 61.8 kg    Examination:  General exam:  Appears calm and comfortable  Respiratory system: Clear to auscultation. Respiratory effort normal. Cardiovascular system: S1 & S2 heard, RRR. No JVD, murmurs, rubs, gallops or clicks. No pedal edema. Gastrointestinal system: Abdomen is nondistended, soft and nontender. No organomegaly or masses felt. Normal bowel sounds heard. Central nervous system: Alert and oriented. No focal neurological deficits. Extremities: Warm well perfused no edema Skin: No rashes, lesions or ulcers Psychiatry: Judgement and insight appear normal. Mood & affect flat.     Data Reviewed: I have personally reviewed following labs and imaging studies  CBC: Recent Labs  Lab 04/08/19 1657 04/09/19 0529  WBC 7.2 6.2  HGB 9.9* 8.5*  HCT 31.3* 25.2*  MCV 94.0 89.4  PLT 314 [id number]   Basic Metabolic Panel: Recent Labs  Lab 04/08/19 1657 04/09/19 0241 04/09/19 0529 04/10/19 0859  NA 135  --  136 137  K 3.4*  --  3.3* 2.8*  CL 109  --  106 109  CO2 14*  --  21* 18*  GLUCOSE 294*  --  460* 84  BUN 44*  --  49* 49*  CREATININE 6.31* 6.32* 6.13* 5.78*  CALCIUM 7.3*  --  7.0* 7.1*  MG 1.7  --   --  1.6*   GFR: Estimated Creatinine Clearance: 12 mL/min (A) (by C-G formula based on SCr of 5.78 mg/dL (H)). Liver Function Tests: Recent Labs  Lab 04/09/19 0529  AST 9*  ALT 7  ALKPHOS 99  BILITOT 0.4  PROT 6.6  ALBUMIN 2.0*   No results for input(s): LIPASE, AMYLASE in the last 168 hours. No results for input(s): AMMONIA in the last 168 hours. Coagulation Profile: No results for input(s): INR, PROTIME in the last 168 hours. Cardiac Enzymes: Recent Labs  Lab 04/09/19 0529  CKTOTAL 37*   BNP (last 3 results) No results for input(s): PROBNP in the last 8760 hours. HbA1C: No results for input(s): HGBA1C in the last 72 hours. CBG: Recent Labs  Lab 04/09/19 1633 04/09/19 2110 04/10/19 0809 04/10/19 0848 04/10/19 1146  GLUCAP 183* 162* 44* 70 112*   Lipid Profile: No results for input(s):  CHOL, HDL, LDLCALC, TRIG, CHOLHDL, LDLDIRECT in the last 72 hours. Thyroid Function Tests: No results for input(s): TSH, T4TOTAL, FREET4, T3FREE, THYROIDAB in the last 72 hours. Anemia Panel: No results for input(s): VITAMINB12, FOLATE, FERRITIN, TIBC, IRON, RETICCTPCT in the last 72 hours. Sepsis Labs: No results for input(s): PROCALCITON, LATICACIDVEN in the last 168 hours.  Recent Results (from the past 240 hour(s))  SARS CORONAVIRUS 2 (TAT 6-24 HRS) Nasopharyngeal Nasopharyngeal Swab     Status: None   Collection Time: 04/08/19 10:49 PM   Specimen: Nasopharyngeal Swab  Result Value Ref Range Status   SARS Coronavirus 2 NEGATIVE NEGATIVE Final    Comment: (NOTE) SARS-CoV-2 target nucleic acids are NOT DETECTED. The SARS-CoV-2 RNA is generally detectable in upper and lower respiratory specimens during the acute phase of infection. Negative results do not preclude SARS-CoV-2 infection, do not rule out co-infections with other pathogens, and should not  be used as the sole basis for treatment or other patient management decisions. Negative results must be combined with clinical observations, patient history, and epidemiological information. The expected result is Negative. Fact Sheet for Patients: SugarRoll.be Fact Sheet for Healthcare Providers: https://www.woods-mathews.com/ This test is not yet approved or cleared by the [place] FDA and  has been authorized for detection and/or diagnosis of SARS-CoV-2 by FDA under an Emergency Use Authorization (EUA). This EUA will remain  in effect (meaning this test can be used) for the duration of the COVID-19 declaration under Section 56 4(b)(1) of the Act, 21 U.S.C. section 360bbb-3(b)(1), unless the authorization is terminated or revoked sooner. Performed at [facility], [place] [address], [place], [place] [address]          Radiology Studies: No results  found.      Scheduled Meds: . amLODipine  2.5 mg Oral Daily  . folic acid  1 mg Oral Daily  . heparin  5,000 Units Subcutaneous Q8H  . insulin aspart  0-5 Units Subcutaneous QHS  . insulin aspart  0-6 Units Subcutaneous TID WC  . insulin glargine  8 Units Subcutaneous Daily  . metoprolol tartrate  50 mg Oral BID  . pantoprazole  40 mg Oral Daily  . sertraline  100 mg Oral Daily  . sodium bicarbonate  650 mg Oral BID   Continuous Infusions: . sodium chloride 100 mL/hr at 04/10/19 0902  . magnesium sulfate bolus IVPB    . potassium chloride 10 mEq (04/10/19 1311)     LOS: 1 day    Time spent: 35 min    Nicolette Bang, MD [facility]  If 7PM-7AM, please contact night-coverage  04/10/2019, 1:12 PM

## 2019-04-10 NOTE — Plan of Care (Signed)
  Problem: Activity: Goal: Risk for activity intolerance will decrease Outcome: Progressing  Ambulating in room.

## 2019-04-11 LAB — BASIC METABOLIC PANEL
Anion gap: 8 (ref 5–15)
BUN: 38 mg/dL — ABNORMAL HIGH (ref 6–20)
CO2: 22 mmol/L (ref 22–32)
Calcium: 6.9 mg/dL — ABNORMAL LOW (ref 8.9–10.3)
Chloride: 106 mmol/L (ref 98–111)
Creatinine, Ser: 4.61 mg/dL — ABNORMAL HIGH (ref 0.61–1.24)
GFR calc Af Amer: 15 mL/min — ABNORMAL LOW (ref >60)
GFR calc non Af Amer: 13 mL/min — ABNORMAL LOW (ref >60)
Glucose, Bld: 328 mg/dL — ABNORMAL HIGH (ref 70–99)
Potassium: 2.8 mmol/L — ABNORMAL LOW (ref 3.5–5.1)
Sodium: 136 mmol/L (ref 135–145)

## 2019-04-11 LAB — GLUCOSE, CAPILLARY
Glucose-Capillary: 268 mg/dL — ABNORMAL HIGH (ref 70–99)
Glucose-Capillary: 166 mg/dL — ABNORMAL HIGH (ref 70–99)
Glucose-Capillary: 236 mg/dL — ABNORMAL HIGH (ref 70–99)
Glucose-Capillary: 132 mg/dL — ABNORMAL HIGH (ref 70–99)

## 2019-04-11 LAB — MRSA PCR SCREENING: MRSA by PCR: NEGATIVE

## 2019-04-11 LAB — PHOSPHORUS
Phosphorus: 2.9 mg/dL (ref 2.5–4.6)
Phosphorus: 2.3 mg/dL — ABNORMAL LOW (ref 2.5–4.6)

## 2019-04-11 LAB — MAGNESIUM: Magnesium: 1.6 mg/dL — ABNORMAL LOW (ref 1.7–2.4)

## 2019-04-11 LAB — HEPATITIS B SURFACE ANTIGEN: Hepatitis B Surface Ag: NONREACTIVE

## 2019-04-11 MED ORDER — MAGNESIUM SULFATE IN D5W 1-5 GM/100ML-% IV SOLN
1.0000 g | Freq: Once | INTRAVENOUS | Status: AC
  Administered 2019-04-11: 10:00:00 1 g via INTRAVENOUS
  Filled 2019-04-11: qty 100

## 2019-04-11 MED ORDER — LIDOCAINE-PRILOCAINE 2.5-2.5 % EX CREA
1.0000 "application " | TOPICAL_CREAM | CUTANEOUS | Status: DC | PRN
  Filled 2019-04-11: qty 5

## 2019-04-11 MED ORDER — HEPARIN SODIUM (PORCINE) 1000 UNIT/ML DIALYSIS
1000.0000 [IU] | INTRAMUSCULAR | Status: DC | PRN
  Filled 2019-04-11: qty 1

## 2019-04-11 MED ORDER — MAGNESIUM SULFATE 50 % IJ SOLN
1.0000 g | Freq: Once | INTRAMUSCULAR | Status: DC
  Filled 2019-04-11: qty 2

## 2019-04-11 MED ORDER — LIDOCAINE HCL (PF) 1 % IJ SOLN
5.0000 mL | INTRAMUSCULAR | Status: DC | PRN
  Filled 2019-04-11: qty 5

## 2019-04-11 MED ORDER — PENTAFLUOROPROP-TETRAFLUOROETH EX AERO
1.0000 "application " | INHALATION_SPRAY | CUTANEOUS | Status: DC | PRN
  Filled 2019-04-11: qty 30

## 2019-04-11 MED ORDER — ALTEPLASE 2 MG IJ SOLR: 2.0000 mg | Freq: Once | INTRAMUSCULAR | Status: DC | PRN

## 2019-04-11 MED ORDER — SODIUM CHLORIDE 0.9 % IV SOLN: 100.0000 mL | INTRAVENOUS | Status: DC | PRN

## 2019-04-11 NOTE — Progress Notes (Signed)
[facility]  ROUNDING NOTE   Subjective:  Patient underwent hemodialysis yesterday as first treatment. He received his second treatment today. Thus far tolerating well.   Objective:  Vital signs in last 24 hours:  Temp:  [98.5 F (36.9 C)-98.8 F (37.1 C)] 98.6 F (37 C) (11/28 1325) Pulse Rate:  [35-77] 64 (11/28 1325) Resp:  [14-22] 18 (11/28 1325) BP: (120-165)/(72-95) 165/95 (11/28 1325) SpO2:  [95 %-100 %] 95 % (11/28 1325)  Weight change:  Filed Weights   04/08/19 1651 04/09/19 0455  Weight: 66.9 kg 61.8 kg    Intake/Output: I/O last 3 completed shifts: In: 1435.8 [P.O.:480; I.V.:955.8] Out: 244 [Urine:250]   Intake/Output this shift:  Total I/O In: 467.2 [I.V.:422.8; IV Piggyback:44.4] Out: 150 [Urine:150]  Physical Exam: General: No acute distress  Head: Normocephalic, atraumatic. Moist oral mucosal membranes  Eyes: Anicteric  Neck: Supple, trachea midline  Lungs:  Clear to auscultation, normal effort  Heart: S1S2 no rubs  Abdomen:  Soft, nontender, bowel sounds present  Extremities: 1+ peripheral edema.  Neurologic: Awake, alert, following commands  Skin: No lesions  Access: IJ PC    Basic Metabolic Panel: Recent Labs  Lab 04/08/19 1657 04/09/19 0241 04/09/19 0529 04/10/19 0859 04/11/19 0420 04/11/19 1032  NA 135  --  136 137 136  --   K 3.4*  --  3.3* 2.8* 2.8*  --   CL 109  --  106 109 106  --   CO2 14*  --  21* 18* 22  --   GLUCOSE 294*  --  460* 84 328*  --   BUN 44*  --  49* 49* 38*  --   CREATININE 6.31* 6.32* 6.13* 5.78* 4.61*  --   CALCIUM 7.3*  --  7.0* 7.1* 6.9*  --   MG 1.7  --   --  1.6* 1.6*  --   PHOS  --   --   --   --  2.9 2.3*    Liver Function Tests: Recent Labs  Lab 04/09/19 0529  AST 9*  ALT 7  ALKPHOS 99  BILITOT 0.4  PROT 6.6  ALBUMIN 2.0*   No results for input(s): LIPASE, AMYLASE in the last 168 hours. No results for input(s): AMMONIA in the last 168 hours.  CBC: Recent Labs  Lab  04/08/19 1657 04/09/19 0529  WBC 7.2 6.2  HGB 9.9* 8.5*  HCT 31.3* 25.2*  MCV 94.0 89.4  PLT 314 281    Cardiac Enzymes: Recent Labs  Lab 04/09/19 0529  CKTOTAL 37*    BNP: Invalid input(s): POCBNP  CBG: Recent Labs  Lab 04/10/19 1146 04/10/19 1642 04/10/19 2140 04/11/19 0744 04/11/19 1316  GLUCAP 112* 193* 276* 268* 166*    Microbiology: Results for orders placed or performed during the hospital encounter of 04/08/19  SARS CORONAVIRUS 2 (TAT 6-24 HRS) Nasopharyngeal Nasopharyngeal Swab     Status: None   Collection Time: 04/08/19 10:49 PM   Specimen: Nasopharyngeal Swab  Result Value Ref Range Status   SARS Coronavirus 2 NEGATIVE NEGATIVE Final    Comment: (NOTE) SARS-CoV-2 target nucleic acids are NOT DETECTED. The SARS-CoV-2 RNA is generally detectable in upper and lower respiratory specimens during the acute phase of infection. Negative results do not preclude SARS-CoV-2 infection, do not rule out co-infections with other pathogens, and should not be used as the sole basis for treatment or other patient management decisions. Negative results must be combined with clinical observations, patient history, and epidemiological information. The  expected result is Negative. Fact Sheet for Patients: SugarRoll.be Fact Sheet for Healthcare Providers: https://www.woods-mathews.com/ This test is not yet approved or cleared by the [place] FDA and  has been authorized for detection and/or diagnosis of SARS-CoV-2 by FDA under an Emergency Use Authorization (EUA). This EUA will remain  in effect (meaning this test can be used) for the duration of the COVID-19 declaration under Section 56 4(b)(1) of the Act, 21 U.S.C. section 360bbb-3(b)(1), unless the authorization is terminated or revoked sooner. Performed at [facility], [place] [address], [place], [place] [address]   MRSA PCR Screening     Status: None    Collection Time: 04/11/19  2:45 AM   Specimen: Nasal Mucosa; Nasopharyngeal  Result Value Ref Range Status   MRSA by PCR NEGATIVE NEGATIVE Final    Comment:        The GeneXpert MRSA Assay (FDA approved for NASAL specimens only), is one component of a comprehensive MRSA colonization surveillance program. It is not intended to diagnose MRSA infection nor to guide or monitor treatment for MRSA infections. Performed at [facility], [address] [place], [place] [address]     Coagulation Studies: No results for input(s): LABPROT, INR in the last 72 hours.  Urinalysis: Recent Labs    04/09/19 0123  COLORURINE YELLOW*  LABSPEC 1.012  PHURINE 5.0  GLUCOSEU >=500*  HGBUR SMALL*  BILIRUBINUR NEGATIVE  KETONESUR NEGATIVE  PROTEINUR >=300*  NITRITE NEGATIVE  LEUKOCYTESUR TRACE*      Imaging: No results found.   Medications:    . amLODipine  2.5 mg Oral Daily  . Chlorhexidine Gluconate Cloth  6 each Topical Q0600  . folic acid  1 mg Oral Daily  . heparin  5,000 Units Subcutaneous Q8H  . insulin aspart  0-5 Units Subcutaneous QHS  . insulin aspart  0-6 Units Subcutaneous TID WC  . insulin glargine  8 Units Subcutaneous Daily  . metoprolol tartrate  50 mg Oral BID  . pantoprazole  40 mg Oral Daily  . sertraline  100 mg Oral Daily  . sodium bicarbonate  650 mg Oral BID   acetaminophen **OR** acetaminophen, ondansetron (ZOFRAN) IV  Assessment/ Plan:  58 y.o. male with longstanding poorly controlled diabetes, hypertension, chronic kidney disease and proteinuria Underlying chronic kidney disease is most likely secondary to diabetes.   1.  ESRD initiated HD.  Patient has been initiated on hemodialysis and he has received 2 treatments thus far.  Tolerating well so far.  We will plan for third treatment tomorrow.  2.  Anemia of chronic kidney disease.  We will likely start the patient on Epogen as an outpatient.  3.  Secondary hyperparathyroidism.   Phosphorus currently 2.3.  Continue to periodically monitor.  4.  Hypokalemia.  Patient was dialyzed against a 4K bath.  Continue to monitor serum potassium.  5.  Metabolic acidosis.  Improved, serum bicarbonate up to 22.   LOS: 2 Dafna Romo 11/28/20203:08 PM

## 2019-04-11 NOTE — Progress Notes (Signed)
PROGRESS NOTE    TRUTH BUNKLEY  [id number] DOB: [birth year] DOA: 04/08/2019 PCP: [name], MD   Brief Narrative:  [name] y.o.male,w hypertension, hyperlipidemia, DM2, CKD stage3, ANA positive, h/o R MCA CVA w chronic bilateral ICA occlusion, presents with c/o dehydration. Pt denies nsaid use or recent iv dye exposure.  Pt states that he had rash and was seen by Dr. Marcelino Scot at [facility] and had labs and sent to ER for evaluation of ARF  Prior spep negative for M spike 07/26/2018   Assessment & Plan:   Principal Problem:   ARF (acute renal failure) ([place]) Active Problems:   Acute renal failure superimposed on stage 3 chronic kidney disease ([place])   Uncontrolled diabetes mellitus ([place])   HTN (hypertension)   Acute renal failure (ARF) ([place])   ESRD (end stage renal disease) ([place])   Acute on chronic CKD 3/4 renal failure: Cont holding Demadex, fluids with sodium bicarb tabs for now, neph following, Consulted nephrology-dialysis cath placed, HD yesterday, planned for today also D/c hydration Recheck BMP in the morning  Nonanion gap acidosis secondary to diarrhea This would be consistent with volume loss and bicarb loss explaining patient's acute renal failure as above. Labs again in the morning Neg C. difficile and GI pathogen panel  Hypokalemia Monitor cautious repletion given patient's acute renal failure Recheck BMP in the morning  Diabetes type 2 Continue sliding scale insulin Check blood glucose AC at bedtime  Diabetic neuropathy Gabapentin was stopped secondary renal insufficiency Start pending recovery of renal function  Hypertension Agree with continuing amlodipine and stopping Demadex  GERD patient is on PPI which I will continue  Anxiety  Patient at baseline no acute decompensation Continue SSRI at 100 mg p.o. daily for Zoloft  DVT prophylaxis: SCD/Compression stockings  Code Status: FULL    Code Status Orders   (From admission, onward)         Start     Ordered   04/09/19 0207  Full code  Continuous     04/09/19 0207        Code Status History    Date Active Date Inactive Code Status Order ID Comments User Context   03/04/2019 2110 03/07/2019 1654 Full Code [id number]  Lang Snow, NP ED   07/25/2018 1842 07/27/2018 1502 Full Code [id number]  Henreitta Leber, MD Inpatient   02/26/2018 2332 03/04/2018 1859 Full Code [id number]  Lance Coon, MD Inpatient   01/15/2018 2158 01/16/2018 1909 Full Code [id number]  Henreitta Leber, MD Inpatient   08/17/2017 0000 08/21/2017 1532 Full Code [id number]  Lance Coon, MD ED   06/29/2016 2124 07/01/2016 1805 Full Code [id number]  Vaughan Basta, MD Inpatient   Advance Care Planning Activity     Family Communication: Discussed with patient's wife answered all questions Disposition Plan:   Patient remained inpatient for continued dialysis, electrolyte monitoring and fluid management.  Patient is not medically safe or ready for discharge Consults called: None Admission status: Inpatient   Consultants:   Nephrology, vascular surgery  Procedures:   No results found.  Antimicrobials:   Cefazolin perioperatively x1   Subjective: No acute events overnight Patient resting comfortably in bed  Objective: Vitals:   04/10/19 2100 04/10/19 2141 04/11/19 0625 04/11/19 1015  BP: 136/80 (!) 157/79 135/80   Pulse: 68 70 64   Resp: 16 18 16    Temp: 98.8 F (37.1 C) 98.5 F (36.9 C) 98.5 F (36.9 C) 98.5 F (36.9 C)  TempSrc: Oral Oral Oral  Oral  SpO2: 99% 100% 100%   Weight:      Height:        Intake/Output Summary (Last 24 hours) at 04/11/2019 1027 Last data filed at 04/11/2019 0734 Gross per 24 hour  Intake 890.53 ml  Output 294 ml  Net 596.53 ml   Filed Weights   04/08/19 1651 04/09/19 0455  Weight: 66.9 kg 61.8 kg    Examination:  General exam: Appears calm and comfortable  Respiratory system: Clear to auscultation.  Respiratory effort normal. Cardiovascular system: S1 & S2 heard, RRR. No JVD, murmurs, rubs, gallops or clicks. No pedal edema. Gastrointestinal system: Abdomen is nondistended, soft and nontender. No organomegaly or masses felt. Normal bowel sounds heard. Central nervous system: Alert and oriented. No focal neurological deficits. Extremities: Warm well perfused neurovascularly intact Skin: Dialysis catheter anterior right chest wall no evidence of bleeding or infection otherwise no rashes, lesions or ulcers Psychiatry: Judgement and insight appear normal. Mood & affect still remain flat    Data Reviewed: I have personally reviewed following labs and imaging studies  CBC: Recent Labs  Lab 04/08/19 1657 04/09/19 0529  WBC 7.2 6.2  HGB 9.9* 8.5*  HCT 31.3* 25.2*  MCV 94.0 89.4  PLT 314 [id number]   Basic Metabolic Panel: Recent Labs  Lab 04/08/19 1657 04/09/19 0241 04/09/19 0529 04/10/19 0859 04/11/19 0420  NA 135  --  136 137 136  K 3.4*  --  3.3* 2.8* 2.8*  CL 109  --  106 109 106  CO2 14*  --  21* 18* 22  GLUCOSE 294*  --  460* 84 328*  BUN 44*  --  49* 49* 38*  CREATININE 6.31* 6.32* 6.13* 5.78* 4.61*  CALCIUM 7.3*  --  7.0* 7.1* 6.9*  MG 1.7  --   --  1.6* 1.6*  PHOS  --   --   --   --  2.9   GFR: Estimated Creatinine Clearance: 15.1 mL/min (A) (by C-G formula based on SCr of 4.61 mg/dL (H)). Liver Function Tests: Recent Labs  Lab 04/09/19 0529  AST 9*  ALT 7  ALKPHOS 99  BILITOT 0.4  PROT 6.6  ALBUMIN 2.0*   No results for input(s): LIPASE, AMYLASE in the last 168 hours. No results for input(s): AMMONIA in the last 168 hours. Coagulation Profile: No results for input(s): INR, PROTIME in the last 168 hours. Cardiac Enzymes: Recent Labs  Lab 04/09/19 0529  CKTOTAL 37*   BNP (last 3 results) No results for input(s): PROBNP in the last 8760 hours. HbA1C: No results for input(s): HGBA1C in the last 72 hours. CBG: Recent Labs  Lab 04/10/19 0848  04/10/19 1146 04/10/19 1642 04/10/19 2140 04/11/19 0744  GLUCAP 70 112* 193* 276* 268*   Lipid Profile: No results for input(s): CHOL, HDL, LDLCALC, TRIG, CHOLHDL, LDLDIRECT in the last 72 hours. Thyroid Function Tests: No results for input(s): TSH, T4TOTAL, FREET4, T3FREE, THYROIDAB in the last 72 hours. Anemia Panel: No results for input(s): VITAMINB12, FOLATE, FERRITIN, TIBC, IRON, RETICCTPCT in the last 72 hours. Sepsis Labs: No results for input(s): PROCALCITON, LATICACIDVEN in the last 168 hours.  Recent Results (from the past 240 hour(s))  SARS CORONAVIRUS 2 (TAT 6-24 HRS) Nasopharyngeal Nasopharyngeal Swab     Status: None   Collection Time: 04/08/19 10:49 PM   Specimen: Nasopharyngeal Swab  Result Value Ref Range Status   SARS Coronavirus 2 NEGATIVE NEGATIVE Final    Comment: (NOTE) SARS-CoV-2 target nucleic acids are NOT DETECTED.  The SARS-CoV-2 RNA is generally detectable in upper and lower respiratory specimens during the acute phase of infection. Negative results do not preclude SARS-CoV-2 infection, do not rule out co-infections with other pathogens, and should not be used as the sole basis for treatment or other patient management decisions. Negative results must be combined with clinical observations, patient history, and epidemiological information. The expected result is Negative. Fact Sheet for Patients: SugarRoll.be Fact Sheet for Healthcare Providers: https://www.woods-mathews.com/ This test is not yet approved or cleared by the [place] FDA and  has been authorized for detection and/or diagnosis of SARS-CoV-2 by FDA under an Emergency Use Authorization (EUA). This EUA will remain  in effect (meaning this test can be used) for the duration of the COVID-19 declaration under Section 56 4(b)(1) of the Act, 21 U.S.C. section 360bbb-3(b)(1), unless the authorization is terminated or revoked sooner. Performed at  [facility], [place] [address], [place], [place] [address]   MRSA PCR Screening     Status: None   Collection Time: 04/11/19  2:45 AM   Specimen: Nasal Mucosa; Nasopharyngeal  Result Value Ref Range Status   MRSA by PCR NEGATIVE NEGATIVE Final    Comment:        The GeneXpert MRSA Assay (FDA approved for NASAL specimens only), is one component of a comprehensive MRSA colonization surveillance program. It is not intended to diagnose MRSA infection nor to guide or monitor treatment for MRSA infections. Performed at [facility], [address], [place], [place] [address]          Radiology Studies: No results found.      Scheduled Meds: . amLODipine  2.5 mg Oral Daily  . Chlorhexidine Gluconate Cloth  6 each Topical Q0600  . folic acid  1 mg Oral Daily  . heparin  5,000 Units Subcutaneous Q8H  . insulin aspart  0-5 Units Subcutaneous QHS  . insulin aspart  0-6 Units Subcutaneous TID WC  . insulin glargine  8 Units Subcutaneous Daily  . metoprolol tartrate  50 mg Oral BID  . pantoprazole  40 mg Oral Daily  . sertraline  100 mg Oral Daily  . sodium bicarbonate  650 mg Oral BID   Continuous Infusions: . sodium chloride 100 mL/hr at 04/11/19 0337  . sodium chloride    . sodium chloride    . magnesium sulfate bolus IVPB 1 g (04/11/19 0938)     LOS: 2 days    Time spent: [place], MD [facility]  If 7PM-7AM, please contact night-coverage  04/11/2019, 10:27 AM

## 2019-04-11 NOTE — Progress Notes (Signed)
This note also relates to the following rows which could not be included: Pulse Rate - Cannot attach notes to unvalidated device data Resp - Cannot attach notes to unvalidated device data BP - Cannot attach notes to unvalidated device data SpO2 - Cannot attach notes to unvalidated device data  Hd started  

## 2019-04-11 NOTE — Progress Notes (Signed)
This note also relates to the following rows which could not be included: Pulse Rate - Cannot attach notes to unvalidated device data  Hd completed 

## 2019-04-12 DIAGNOSIS — N186 End stage renal disease: Secondary | ICD-10-CM

## 2019-04-12 LAB — GLUCOSE, CAPILLARY
Glucose-Capillary: 76 mg/dL (ref 70–99)
Glucose-Capillary: 84 mg/dL (ref 70–99)
Glucose-Capillary: 126 mg/dL — ABNORMAL HIGH (ref 70–99)
Glucose-Capillary: 82 mg/dL (ref 70–99)
Glucose-Capillary: 145 mg/dL — ABNORMAL HIGH (ref 70–99)

## 2019-04-12 LAB — BASIC METABOLIC PANEL
Anion gap: 8 (ref 5–15)
BUN: 25 mg/dL — ABNORMAL HIGH (ref 6–20)
CO2: 24 mmol/L (ref 22–32)
Calcium: 7.2 mg/dL — ABNORMAL LOW (ref 8.9–10.3)
Chloride: 107 mmol/L (ref 98–111)
Creatinine, Ser: 3.53 mg/dL — ABNORMAL HIGH (ref 0.61–1.24)
GFR calc Af Amer: 21 mL/min — ABNORMAL LOW (ref >60)
GFR calc non Af Amer: 18 mL/min — ABNORMAL LOW (ref >60)
Glucose, Bld: 67 mg/dL — ABNORMAL LOW (ref 70–99)
Potassium: 3 mmol/L — ABNORMAL LOW (ref 3.5–5.1)
Sodium: 139 mmol/L (ref 135–145)

## 2019-04-12 LAB — MAGNESIUM: Magnesium: 1.7 mg/dL (ref 1.7–2.4)

## 2019-04-12 LAB — PHOSPHORUS: Phosphorus: 2.5 mg/dL (ref 2.5–4.6)

## 2019-04-12 MED ORDER — MAGNESIUM SULFATE IN D5W 1-5 GM/100ML-% IV SOLN
1.0000 g | Freq: Once | INTRAVENOUS | Status: AC
  Administered 2019-04-12: 1 g via INTRAVENOUS
  Filled 2019-04-12: qty 100

## 2019-04-12 MED ORDER — MAGNESIUM SULFATE 50 % IJ SOLN
1.0000 g | Freq: Once | INTRAMUSCULAR | Status: DC
  Filled 2019-04-12: qty 2

## 2019-04-12 NOTE — Progress Notes (Signed)
[facility]  ROUNDING NOTE   Subjective:  Patient seen and evaluated at bedside. Due for his third dialysis treatment today. Awaiting placement into outpatient dialysis unit.   Objective:  Vital signs in last 24 hours:  Temp:  [98 F (36.7 C)-99.2 F (37.3 C)] 98 F (36.7 C) (11/29 0502) Pulse Rate:  [63-71] 65 (11/29 1215) Resp:  [16-20] 16 (11/29 1215) BP: (142-165)/(69-95) 156/79 (11/29 1215) SpO2:  [95 %-99 %] 99 % (11/29 0502) Weight:  [67.9 kg] 67.9 kg (11/29 0502)  Weight change:  Filed Weights   04/08/19 1651 04/09/19 0455 04/12/19 0502  Weight: 66.9 kg 61.8 kg 67.9 kg    Intake/Output: I/O last 3 completed shifts: In: 1017.7 [I.V.:973.3; IV Piggyback:44.4] Out: 494 [Urine:500]   Intake/Output this shift:  Total I/O In: 240 [P.O.:240] Out: -   Physical Exam: General: No acute distress  Head: Normocephalic, atraumatic. Moist oral mucosal membranes  Eyes: Anicteric  Neck: Supple, trachea midline  Lungs:  Clear to auscultation, normal effort  Heart: S1S2 no rubs  Abdomen:  Soft, nontender, bowel sounds present  Extremities: 1+ peripheral edema.  Neurologic: Awake, alert, following commands  Skin: No lesions  Access: IJ PC    Basic Metabolic Panel: Recent Labs  Lab 04/08/19 1657 04/09/19 0241 04/09/19 0529 04/10/19 0859 04/11/19 0420 04/11/19 1032 04/12/19 0427  NA 135  --  136 137 136  --  139  K 3.4*  --  3.3* 2.8* 2.8*  --  3.0*  CL 109  --  106 109 106  --  107  CO2 14*  --  21* 18* 22  --  24  GLUCOSE 294*  --  460* 84 328*  --  67*  BUN 44*  --  49* 49* 38*  --  25*  CREATININE 6.31* 6.32* 6.13* 5.78* 4.61*  --  3.53*  CALCIUM 7.3*  --  7.0* 7.1* 6.9*  --  7.2*  MG 1.7  --   --  1.6* 1.6*  --  1.7  PHOS  --   --   --   --  2.9 2.3* 2.5    Liver Function Tests: Recent Labs  Lab 04/09/19 0529  AST 9*  ALT 7  ALKPHOS 99  BILITOT 0.4  PROT 6.6  ALBUMIN 2.0*   No results for input(s): LIPASE, AMYLASE in the last  168 hours. No results for input(s): AMMONIA in the last 168 hours.  CBC: Recent Labs  Lab 04/08/19 1657 04/09/19 0529  WBC 7.2 6.2  HGB 9.9* 8.5*  HCT 31.3* 25.2*  MCV 94.0 89.4  PLT 314 281    Cardiac Enzymes: Recent Labs  Lab 04/09/19 0529  CKTOTAL 37*    BNP: Invalid input(s): POCBNP  CBG: Recent Labs  Lab 04/11/19 1635 04/11/19 2101 04/12/19 0620 04/12/19 0735 04/12/19 1118  GLUCAP 236* 132* 76 84 126*    Microbiology: Results for orders placed or performed during the hospital encounter of 04/08/19  SARS CORONAVIRUS 2 (TAT 6-24 HRS) Nasopharyngeal Nasopharyngeal Swab     Status: None   Collection Time: 04/08/19 10:49 PM   Specimen: Nasopharyngeal Swab  Result Value Ref Range Status   SARS Coronavirus 2 NEGATIVE NEGATIVE Final    Comment: (NOTE) SARS-CoV-2 target nucleic acids are NOT DETECTED. The SARS-CoV-2 RNA is generally detectable in upper and lower respiratory specimens during the acute phase of infection. Negative results do not preclude SARS-CoV-2 infection, do not rule out co-infections with other pathogens, and should not be used  as the sole basis for treatment or other patient management decisions. Negative results must be combined with clinical observations, patient history, and epidemiological information. The expected result is Negative. Fact Sheet for Patients: SugarRoll.be Fact Sheet for Healthcare Providers: https://www.woods-mathews.com/ This test is not yet approved or cleared by the [place] FDA and  has been authorized for detection and/or diagnosis of SARS-CoV-2 by FDA under an Emergency Use Authorization (EUA). This EUA will remain  in effect (meaning this test can be used) for the duration of the COVID-19 declaration under Section 56 4(b)(1) of the Act, 21 U.S.C. section 360bbb-3(b)(1), unless the authorization is terminated or revoked sooner. Performed at [facility],  [place] [address], [place], [place] [address]   MRSA PCR Screening     Status: None   Collection Time: 04/11/19  2:45 AM   Specimen: Nasal Mucosa; Nasopharyngeal  Result Value Ref Range Status   MRSA by PCR NEGATIVE NEGATIVE Final    Comment:        The GeneXpert MRSA Assay (FDA approved for NASAL specimens only), is one component of a comprehensive MRSA colonization surveillance program. It is not intended to diagnose MRSA infection nor to guide or monitor treatment for MRSA infections. Performed at [facility], [place] [place], [place] [address]     Coagulation Studies: No results for input(s): LABPROT, INR in the last 72 hours.  Urinalysis: No results for input(s): COLORURINE, LABSPEC, PHURINE, GLUCOSEU, HGBUR, BILIRUBINUR, KETONESUR, PROTEINUR, UROBILINOGEN, NITRITE, LEUKOCYTESUR in the last 72 hours.  Invalid input(s): APPERANCEUR    Imaging: No results found.   Medications:    . amLODipine  2.5 mg Oral Daily  . Chlorhexidine Gluconate Cloth  6 each Topical Q0600  . folic acid  1 mg Oral Daily  . heparin  5,000 Units Subcutaneous Q8H  . insulin aspart  0-5 Units Subcutaneous QHS  . insulin aspart  0-6 Units Subcutaneous TID WC  . insulin glargine  8 Units Subcutaneous Daily  . metoprolol tartrate  50 mg Oral BID  . pantoprazole  40 mg Oral Daily  . sertraline  100 mg Oral Daily  . sodium bicarbonate  650 mg Oral BID   acetaminophen **OR** acetaminophen, ondansetron (ZOFRAN) IV  Assessment/ Plan:  58 y.o. male with longstanding poorly controlled diabetes, hypertension, chronic kidney disease and proteinuria Underlying chronic kidney disease is most likely secondary to diabetes.   1.  ESRD initiated HD.  Patient is tolerating dialysis treatments quite well.  Third dialysis treatment today.  He is still pending outpatient hemodialysis center placement.  He has requested placement at [facility].  2.  Anemia of chronic kidney disease.   Patient to start on Epogen as an outpatient..  3.  Secondary hyperparathyroidism.  Phosphorus 2.5 and acceptable.  4.  Hypokalemia.  Most recent potassium was 3.0.  Dialyze the patient against a 4K bath.  5.  Metabolic acidosis.  Now resolved.   LOS: 3 Jilda Kress 11/29/202012:36 PM

## 2019-04-12 NOTE — Progress Notes (Signed)
Pre HD Tx Assessment   04/12/19 1210  Neurological  Level of Consciousness Alert  Orientation Level Oriented X4  Respiratory  Respiratory Pattern Regular;Unlabored  Chest Assessment Chest expansion symmetrical  Bilateral Breath Sounds Diminished;Clear  Cardiac  Pulse Regular  Heart Sounds S1, S2  Jugular Venous Distention (JVD) No  ECG Monitor No  Cardiac Rhythm NSR  Ectopy Multifocal PVC's  Antiarrhythmic device No  Vascular  R Radial Pulse +2  L Radial Pulse +2  Integumentary  Integumentary (WDL) X  Skin Color Appropriate for ethnicity  Skin Condition Dry  Skin Integrity Rash  Rash Location Back;Chest  Rash Location Orientation Left  Musculoskeletal  Musculoskeletal (WDL) X  Generalized Weakness Yes  Gastrointestinal  Bowel Sounds Assessment Active  Last BM Date 04/12/19  GU Assessment  Genitourinary (WDL) X  Genitourinary Symptoms  (HD Start Pt )  Psychosocial  Psychosocial (WDL) X  Patient Behaviors Agitated;Uncooperative  Needs Expressed Emotional  Emotional support given Given to patient

## 2019-04-12 NOTE — Progress Notes (Signed)
Post HD Tx Note Pt tolerated well the Tx. Pt reported no chest pain or SOB. BP post termination WDL. Pt had Afib during the last hour of tx. Pt tx run for 3hrs on 4K2.5Ca.    04/12/19 1530  Hand-Off documentation  Report given to (Full Name) Lum Babe RN   Report received from (Full Name) Newt Minion   Vital Signs  Temp 99 F (37.2 C)  Temp Source Oral  Pulse Rate 67  Resp 16  BP (!) 161/88  BP Location Left Arm  BP Method Automatic  Patient Position (if appropriate) Lying  Oxygen Therapy  SpO2 100 %  O2 Device Room Air  Pain Assessment  Pain Scale 0-10  Pain Score 0  Post-Hemodialysis Assessment  Rinseback Volume (mL) 250 mL  KECN 46.3 V  Dialyzer Clearance Lightly streaked  Duration of HD Treatment -hour(s) 3 hour(s)  Hemodialysis Intake (mL) 500 mL  UF Total -Machine (mL) 1000 mL  Net UF (mL) 500 mL  Tolerated HD Treatment Yes  Hemodialysis Catheter Right Internal jugular Double lumen Permanent (Tunneled)  Placement Date/Time: 04/10/19 1440   Placed prior to admission: Yes  Time Out: Correct patient;Correct site;Correct procedure  Maximum sterile barrier precautions: Hand hygiene;Cap;Sterile gown;Mask;Sterile gloves;Large sterile sheet  Site Prep: Chlor...  Site Condition No complications  Blue Lumen Status Heparin locked  Red Lumen Status Heparin locked  Purple Lumen Status N/A  Catheter fill solution Heparin 1000 units/ml  Catheter fill volume (Arterial) 1.5 cc  Catheter fill volume (Venous) 1.5  Dressing Type Biopatch  Dressing Status Intact;Dry;Clean  Interventions Dressing reinforced  Drainage Description None  Dressing Change Due 04/16/19  Post treatment catheter status Capped and Clamped   CVC limbs heparin locked closed and clamped

## 2019-04-12 NOTE — Progress Notes (Signed)
HD Tx completed    04/12/19 1515  Vital Signs  Pulse Rate 67  Resp 17  BP (!) 168/83  Oxygen Therapy  SpO2 100 %  O2 Device Room Air  During Hemodialysis Assessment  Blood Flow Rate (mL/min) 300 mL/min  Arterial Pressure (mmHg) -150 mmHg  Venous Pressure (mmHg) 160 mmHg  Transmembrane Pressure (mmHg) 70 mmHg  Ultrafiltration Rate (mL/min) 330 mL/min  Dialysate Flow Rate (mL/min) 660 ml/min  Conductivity: Machine  14  HD Safety Checks Performed Yes  Intra-Hemodialysis Comments Tx completed

## 2019-04-12 NOTE — Progress Notes (Signed)
HD Tx Start    04/12/19 1215  Vital Signs  Pulse Rate 65  Resp 16  BP (!) 156/79  Oxygen Therapy  SpO2 100 %  O2 Device Room Air  Pain Assessment  Pain Scale 0-10  Pain Score 0  During Hemodialysis Assessment  Blood Flow Rate (mL/min) 300 mL/min  Arterial Pressure (mmHg) -120 mmHg  Venous Pressure (mmHg) 150 mmHg  Transmembrane Pressure (mmHg) 70 mmHg  Ultrafiltration Rate (mL/min) 330 mL/min  Dialysate Flow Rate (mL/min) 600 ml/min  Conductivity: Machine  14  HD Safety Checks Performed Yes  Dialysis Fluid Bolus Normal Saline  Bolus Amount (mL) 250 mL  Intra-Hemodialysis Comments Tx initiated

## 2019-04-12 NOTE — Progress Notes (Signed)
PROGRESS NOTE    [name]  [id number] DOB: [birth year] DOA: 04/08/2019 PCP: Reginald Harrier, MD   Brief Narrative:  [name] y.o.male,w hypertension, hyperlipidemia, DM2, CKD stage3, ANA positive, h/o R MCA CVA w chronic bilateral ICA occlusion, presents with c/o dehydration. Pt denies nsaid use or recent iv dye exposure.  Pt states that he had rash and was seen by Dr. Marcelino Baker at [facility] and had labs and sent to ER for evaluation of ARF  Prior spep negative for M spike 07/26/2018   Assessment & Plan:   Principal Problem:   ARF (acute renal failure) ([place]) Active Problems:   Acute renal failure superimposed on stage 3 chronic kidney disease ([place])   Uncontrolled diabetes mellitus ([place])   HTN (hypertension)   Acute renal failure (ARF) ([place])   ESRD (end stage renal disease) ([place])   Acute on chronic CKD 3/4 renal failure: Conth[place] Demadex, BICARB TABS OFF, neph following, Consulted nephrology-dialysis cath placed, HD yesterday, planned for today also D/c'd hydration Recheck BMP in the morning  Nonanion gap acidosis secondary to diarrhea Improving with dialysis This would be consistent with volume loss and bicarb loss explaining patient's acute renal failure as above. Labs again in the morning [place]. difficile and GI pathogen panel  Hypokalemia Monitor cautious repletion given patient's acute renal failure Monitor BMP in the morning  Diabetes type 2 Continue sliding scale insulin Check blood glucose AC at bedtime  Diabetic neuropathy Gabapentin was stopped secondary renal insufficiency Start pending recovery of renal function  Hypertension Agree with continuing amlodipine and stopping Demadex  GERD patient is on PPI which I will continue  Anxiety  Patient at baseline no acute decompensation Continue SSRI at 100 mg p.o. daily for Zoloft  DVT prophylaxis: SCD/Compression stockings  Code Status: FULL    Code Status  Orders  (From admission, onward)         Start     Ordered   04/09/19 0207  Full code  Continuous     04/09/19 0207        Code Status History    Date Active Date Inactive Code Status Order ID Comments User Context   03/04/2019 2110 03/07/2019 1654 Full Code [id number]  Reginald Snow, NP ED   07/25/2018 1842 07/27/2018 1502 Full Code [id number]  Reginald Leber, MD Inpatient   02/26/2018 2332 03/04/2018 1859 Full Code [id number]  Reginald Coon, MD Inpatient   01/15/2018 2158 01/16/2018 1909 Full Code [id number]  Reginald Leber, MD Inpatient   08/17/2017 0000 08/21/2017 1532 Full Code [id number]  Reginald Coon, MD ED   06/29/2016 2124 07/01/2016 1805 Full Code [id number]  Reginald Basta, MD Inpatient   Advance Care Planning Activity     Family Communication: None today Disposition Plan:    Patient remained inpatient for continued dialysis, electrolyte monitoring and fluid management.  Patient is not medically safe or ready for discharge Consults called: None Admission status: Inpatient   Consultants:   Nephrology, vascular surgery  Procedures:   No results found.  Antimicrobials:   Cefazolin perioperatively   Subjective: No acute events overnight completed day 2 of initiation of dialysis and tolerated without complaint  Objective: Vitals:   04/11/19 1300 04/11/19 1325 04/11/19 1946 04/12/19 0502  BP:  (!) 165/95 (!) 159/94 (!) 142/69  Pulse: 68 64 71 63  Resp:  18 18 20   Temp:  98.6 F (37 C) 99.2 F (37.3 C) 98 F (36.7 C)  TempSrc:  Oral Oral Oral  SpO2:  95% 99% 99%  Weight:    67.9 kg  Height:        Intake/Output Summary (Last 24 hours) at 04/12/2019 1050 Last data filed at 04/12/2019 0946 Gross per 24 hour  Intake 707.2 ml  Output 200 ml  Net 507.2 ml   Filed Weights   04/08/19 1651 04/09/19 0455 04/12/19 0502  Weight: 66.9 kg 61.8 kg 67.9 kg    Examination:  General exam: Appears calm and comfortable  Respiratory system: Clear to  auscultation. Respiratory effort normal. Cardiovascular system: S1 & S2 heard, RRR. No JVD, murmurs, rubs, gallops or clicks. No pedal edema. Gastrointestinal system: Abdomen is nondistended, soft and nontender. No organomegaly or masses felt. Normal bowel sounds heard. Central nervous system: Alert and oriented. No focal neurological deficits. Extremities: Warm well perfused neurovascularly intact Skin: Dialysis catheter anterior right chest wall no evidence of bleeding or infection otherwise no rashes, lesions or ulcers Psychiatry: Judgement and insight appear normal. Mood & affect still remain flat     Data Reviewed: I have personally reviewed following labs and imaging studies  CBC: Recent Labs  Lab 04/08/19 1657 04/09/19 0529  WBC 7.2 6.2  HGB 9.9* 8.5*  HCT 31.3* 25.2*  MCV 94.0 89.4  PLT 314 [id number]   Basic Metabolic Panel: Recent Labs  Lab 04/08/19 1657 04/09/19 0241 04/09/19 0529 04/10/19 0859 04/11/19 0420 04/11/19 1032 04/12/19 0427  NA 135  --  136 137 136  --  139  K 3.4*  --  3.3* 2.8* 2.8*  --  3.0*  CL 109  --  106 109 106  --  107  CO2 14*  --  21* 18* 22  --  24  GLUCOSE 294*  --  460* 84 328*  --  67*  BUN 44*  --  49* 49* 38*  --  25*  CREATININE 6.31* 6.32* 6.13* 5.78* 4.61*  --  3.53*  CALCIUM 7.3*  --  7.0* 7.1* 6.9*  --  7.2*  MG 1.7  --   --  1.6* 1.6*  --  1.7  PHOS  --   --   --   --  2.9 2.3* 2.5   GFR: Estimated Creatinine Clearance: 21.6 mL/min (A) (by C-G formula based on SCr of 3.53 mg/dL (H)). Liver Function Tests: Recent Labs  Lab 04/09/19 0529  AST 9*  ALT 7  ALKPHOS 99  BILITOT 0.4  PROT 6.6  ALBUMIN 2.0*   No results for input(s): LIPASE, AMYLASE in the last 168 hours. No results for input(s): AMMONIA in the last 168 hours. Coagulation Profile: No results for input(s): INR, PROTIME in the last 168 hours. Cardiac Enzymes: Recent Labs  Lab 04/09/19 0529  CKTOTAL 37*   BNP (last 3 results) No results for input(s):  PROBNP in the last 8760 hours. HbA1C: No results for input(s): HGBA1C in the last 72 hours. CBG: Recent Labs  Lab 04/11/19 1316 04/11/19 1635 04/11/19 2101 04/12/19 0620 04/12/19 0735  GLUCAP 166* 236* 132* 76 84   Lipid Profile: No results for input(s): CHOL, HDL, LDLCALC, TRIG, CHOLHDL, LDLDIRECT in the last 72 hours. Thyroid Function Tests: No results for input(s): TSH, T4TOTAL, FREET4, T3FREE, THYROIDAB in the last 72 hours. Anemia Panel: No results for input(s): VITAMINB12, FOLATE, FERRITIN, TIBC, IRON, RETICCTPCT in the last 72 hours. Sepsis Labs: No results for input(s): PROCALCITON, LATICACIDVEN in the last 168 hours.  Recent Results (from the past 240 hour(s))  SARS CORONAVIRUS 2 (TAT 6-24 HRS) Nasopharyngeal Nasopharyngeal Swab  Status: None   Collection Time: 04/08/19 10:49 PM   Specimen: Nasopharyngeal Swab  Result Value Ref Range Status   SARS Coronavirus 2 NEGATIVE NEGATIVE Final    Comment: (NOTE) SARS-CoV-2 target nucleic acids are NOT DETECTED. The SARS-CoV-2 RNA is generally detectable in upper and lower respiratory specimens during the acute phase of infection. Negative results do not preclude SARS-CoV-2 infection, do not rule out co-infections with other pathogens, and should not be used as the sole basis for treatment or other patient management decisions. Negative results must be combined with clinical observations, patient history, and epidemiological information. The expected result is Negative. Fact Sheet for Patients: SugarRoll.be Fact Sheet for Healthcare Providers: https://www.woods-mathews.com/ This test is not yet approved or cleared by the [place] FDA and  has been authorized for detection and/or diagnosis of SARS-CoV-2 by FDA under an Emergency Use Authorization (EUA). This EUA will remain  in effect (meaning this test can be used) for the duration of the COVID-19 declaration under Section  56 4(b)(1) of the Act, 21 U.S.C. section 360bbb-3(b)(1), unless the authorization is terminated or revoked sooner. Performed at [facility], [place] [address], [place], [place] [address]   MRSA PCR Screening     Status: None   Collection Time: 04/11/19  2:45 AM   Specimen: Nasal Mucosa; Nasopharyngeal  Result Value Ref Range Status   MRSA by PCR NEGATIVE NEGATIVE Final    Comment:        The GeneXpert MRSA Assay (FDA approved for NASAL specimens only), is one component of a comprehensive MRSA colonization surveillance program. It is not intended to diagnose MRSA infection nor to guide or monitor treatment for MRSA infections. Performed at [facility], [address] [place], [place] [address]          Radiology Studies: No results found.      Scheduled Meds: . amLODipine  2.5 mg Oral Daily  . Chlorhexidine Gluconate Cloth  6 each Topical Q0600  . folic acid  1 mg Oral Daily  . heparin  5,000 Units Subcutaneous Q8H  . insulin aspart  0-5 Units Subcutaneous QHS  . insulin aspart  0-6 Units Subcutaneous TID WC  . insulin glargine  8 Units Subcutaneous Daily  . metoprolol tartrate  50 mg Oral BID  . pantoprazole  40 mg Oral Daily  . sertraline  100 mg Oral Daily  . sodium bicarbonate  650 mg Oral BID   Continuous Infusions:   LOS: 3 days    Time spent: 35 min    Nicolette Bang, MD [facility]  If 7PM-7AM, please contact night-coverage  04/12/2019, 10:50 AM

## 2019-04-12 NOTE — Progress Notes (Signed)
Pre HD Tx Note  04/12/19 1210  Hand-Off documentation  Report given to (Full Name) Newt Minion RN   Report received from (Full Name) Lum Babe RN   Vital Signs  Temp 98.3 F (36.8 C)  Temp Source Oral  Pulse Rate 65  Pulse Rate Source Monitor  Resp 16  BP (!) 156/79  BP Location Left Arm  BP Method Automatic  Patient Position (if appropriate) Lying  Oxygen Therapy  SpO2 100 %  O2 Device Room Air  Pain Assessment  Pain Scale 0-10  Pain Score 0  Time-Out for Hemodialysis  What Procedure? HD   Pt Identifiers(min of two) MRN/Account#;First/Last Name  Correct Site? Yes  Correct Side? Yes  Correct Procedure? Yes  Consents Verified? Yes  Safety Precautions Reviewed? Yes  Engineer, civil (consulting) Number 6  Station Number 3  UF/Alarm Test Passed  Conductivity: Meter 13.6  Conductivity: Machine  14  pH 7.4  Reverse Osmosis Main  Normal Saline Lot Number [id number]  Dialyzer Lot Number [id number]  Disposable Set Lot Number [id number]  Machine Temperature 97.7 F (36.5 C)  Musician and Audible Yes  Blood Lines Intact and Secured Yes  Pre Treatment Patient Checks  Vascular access used during treatment Catheter  HD catheter dressing before treatment WDL  Hepatitis B Surface Antigen Results Negative  Date Hepatitis B Surface Antigen Drawn 04/10/19  Hepatitis B Surface Antibody  (<10)  Date Hepatitis B Surface Antibody Drawn 04/10/19  Hemodialysis Consent Verified Yes  Hemodialysis Standing Orders Initiated Yes  ECG (Telemetry) Monitor On Yes  Prime Ordered Normal Saline  Length of  DialysisTreatment -hour(s) 3 Hour(s)  Dialysis Treatment Comments  (Na140)  Dialyzer Elisio 17H NR  Dialysate 4K;2.5 Ca  Dialysis Anticoagulant None  Dialysate Flow Ordered 600  Blood Flow Rate Ordered 300 mL/min  Ultrafiltration Goal 0.5 Liters  Dialysis Blood Pressure Support Ordered Albumin  Education / Care Plan  Dialysis Education Provided Yes  Documented Education in Care  Plan Yes  Hemodialysis Catheter Right Internal jugular Double lumen Permanent (Tunneled)  Placement Date/Time: 04/10/19 1440   Placed prior to admission: Yes  Time Out: Correct patient;Correct site;Correct procedure  Maximum sterile barrier precautions: Hand hygiene;Cap;Sterile gown;Mask;Sterile gloves;Large sterile sheet  Site Prep: Chlor...  Site Condition No complications  Blue Lumen Status Infusing;Blood return noted  Red Lumen Status Flushed;Infusing;Blood return noted  Purple Lumen Status N/A  Catheter fill solution Heparin 1000 units/ml  Catheter fill volume (Arterial) 1.5 cc  Catheter fill volume (Venous) 1.5  Dressing Type Biopatch  Dressing Status Intact;Dry;Clean  Drainage Description None  Dressing Change Due 04/15/19

## 2019-04-12 NOTE — Progress Notes (Signed)
Post HD Tx Assessment   04/12/19 1530  Neurological  Level of Consciousness Alert  Orientation Level Oriented X4  Respiratory  Respiratory Pattern Regular;Unlabored  Chest Assessment Chest expansion symmetrical  Bilateral Breath Sounds Clear;Diminished  Cardiac  Pulse Irregular  Jugular Venous Distention (JVD) No  ECG Monitor No  Cardiac Rhythm Atrial fibrillation  Ectopy Multifocal PVC's  Antiarrhythmic device No  Vascular  R Radial Pulse +2  L Radial Pulse +2  Integumentary  Integumentary (WDL) X  Skin Color Appropriate for ethnicity  Skin Condition Dry  Skin Integrity Rash  Rash Location Back;Chest  Rash Location Orientation Left  Musculoskeletal  Musculoskeletal (WDL) X  Generalized Weakness Yes  Gastrointestinal  Bowel Sounds Assessment Active  Last BM Date 04/12/19  GU Assessment  Genitourinary (WDL) X  Genitourinary Symptoms  (HD Start Pt )  Psychosocial  Psychosocial (WDL) X  Patient Behaviors Agitated;Uncooperative  Needs Expressed Emotional  Emotional support given Given to patient

## 2019-04-12 NOTE — Plan of Care (Signed)

## 2019-04-13 LAB — GLUCOSE, CAPILLARY
Glucose-Capillary: 148 mg/dL — ABNORMAL HIGH (ref 70–99)
Glucose-Capillary: 180 mg/dL — ABNORMAL HIGH (ref 70–99)
Glucose-Capillary: 57 mg/dL — ABNORMAL LOW (ref 70–99)
Glucose-Capillary: 56 mg/dL — ABNORMAL LOW (ref 70–99)
Glucose-Capillary: 49 mg/dL — ABNORMAL LOW (ref 70–99)
Glucose-Capillary: 140 mg/dL — ABNORMAL HIGH (ref 70–99)
Glucose-Capillary: 65 mg/dL — ABNORMAL LOW (ref 70–99)

## 2019-04-13 LAB — BASIC METABOLIC PANEL
Anion gap: 7 (ref 5–15)
BUN: 12 mg/dL (ref 6–20)
CO2: 26 mmol/L (ref 22–32)
Calcium: 7.6 mg/dL — ABNORMAL LOW (ref 8.9–10.3)
Chloride: 106 mmol/L (ref 98–111)
Creatinine, Ser: 2.44 mg/dL — ABNORMAL HIGH (ref 0.61–1.24)
GFR calc Af Amer: 32 mL/min — ABNORMAL LOW (ref >60)
GFR calc non Af Amer: 28 mL/min — ABNORMAL LOW (ref >60)
Glucose, Bld: 166 mg/dL — ABNORMAL HIGH (ref 70–99)
Potassium: 3 mmol/L — ABNORMAL LOW (ref 3.5–5.1)
Sodium: 139 mmol/L (ref 135–145)

## 2019-04-13 LAB — HEPATITIS B SURFACE ANTIBODY, QUANTITATIVE: Hep B S AB Quant (Post): <3.1 m[IU]/mL — ABNORMAL LOW (ref >9.9)

## 2019-04-13 LAB — MAGNESIUM: Magnesium: 1.7 mg/dL (ref 1.7–2.4)

## 2019-04-13 LAB — PHOSPHORUS: Phosphorus: 1.3 mg/dL — ABNORMAL LOW (ref 2.5–4.6)

## 2019-04-13 NOTE — Progress Notes (Signed)
Wife called for update. Update given. Wife asked if and when patient is discharged that we give her a call so she can make arrangements to pick him up.   Fuller Mandril, RN

## 2019-04-13 NOTE — Progress Notes (Signed)
[facility]  ROUNDING NOTE   Subjective:   Patient says he is tired. Does not want dialysis today.   Objective:  Vital signs in last 24 hours:  Temp:  [98 F (36.7 C)-99.1 F (37.3 C)] 98 F (36.7 C) (11/30 1134) Pulse Rate:  [59-70] 67 (11/30 1134) Resp:  [10-18] 18 (11/30 1134) BP: (140-168)/(79-94) 161/94 (11/30 1134) SpO2:  [99 %-100 %] 100 % (11/30 1134) Weight:  [66.6 kg] 66.6 kg (11/30 0415)  Weight change: -1.3 kg Filed Weights   04/09/19 0455 04/12/19 0502 04/13/19 0415  Weight: 61.8 kg 67.9 kg 66.6 kg    Intake/Output: I/O last 3 completed shifts: In: 540 [P.O.:540] Out: 700 [Urine:200; Other:500]   Intake/Output this shift:  Total I/O In: 30 [P.O.:30] Out: 0   Physical Exam: General: No acute distress, laying in bed  Head: Normocephalic, atraumatic. Moist oral mucosal membranes  Eyes: Anicteric  Neck: Supple, trachea midline  Lungs:  Clear to auscultation, normal effort  Heart: regular  Abdomen:  Soft, nontender, bowel sounds present  Extremities: trace peripheral edema.  Neurologic: Awake, alert, following commands  Skin: No lesions  Access: IJ PC    Basic Metabolic Panel: Recent Labs  Lab 04/08/19 1657  04/09/19 0529 04/10/19 0859 04/11/19 0420 04/11/19 1032 04/12/19 0427 04/13/19 0605  NA 135  --  136 137 136  --  139 139  K 3.4*  --  3.3* 2.8* 2.8*  --  3.0* 3.0*  CL 109  --  106 109 106  --  107 106  CO2 14*  --  21* 18* 22  --  24 26  GLUCOSE 294*  --  460* 84 328*  --  67* 166*  BUN 44*  --  49* 49* 38*  --  25* 12  CREATININE 6.31*   < > 6.13* 5.78* 4.61*  --  3.53* 2.44*  CALCIUM 7.3*  --  7.0* 7.1* 6.9*  --  7.2* 7.6*  MG 1.7  --   --  1.6* 1.6*  --  1.7 1.7  PHOS  --   --   --   --  2.9 2.3* 2.5 1.3*   < > = values in this interval not displayed.    Liver Function Tests: Recent Labs  Lab 04/09/19 0529  AST 9*  ALT 7  ALKPHOS 99  BILITOT 0.4  PROT 6.6  ALBUMIN 2.0*   No results for input(s): LIPASE,  AMYLASE in the last 168 hours. No results for input(s): AMMONIA in the last 168 hours.  CBC: Recent Labs  Lab 04/08/19 1657 04/09/19 0529  WBC 7.2 6.2  HGB 9.9* 8.5*  HCT 31.3* 25.2*  MCV 94.0 89.4  PLT 314 281    Cardiac Enzymes: Recent Labs  Lab 04/09/19 0529  CKTOTAL 37*    BNP: Invalid input(s): POCBNP  CBG: Recent Labs  Lab 04/12/19 1118 04/12/19 1555 04/12/19 2117 04/13/19 0729 04/13/19 1133  GLUCAP 126* 82 145* 148* 180*    Microbiology: Results for orders placed or performed during the hospital encounter of 04/08/19  SARS CORONAVIRUS 2 (TAT 6-24 HRS) Nasopharyngeal Nasopharyngeal Swab     Status: None   Collection Time: 04/08/19 10:49 PM   Specimen: Nasopharyngeal Swab  Result Value Ref Range Status   SARS Coronavirus 2 NEGATIVE NEGATIVE Final    Comment: (NOTE) SARS-CoV-2 target nucleic acids are NOT DETECTED. The SARS-CoV-2 RNA is generally detectable in upper and lower respiratory specimens during the acute phase of infection. Negative results  do not preclude SARS-CoV-2 infection, do not rule out co-infections with other pathogens, and should not be used as the sole basis for treatment or other patient management decisions. Negative results must be combined with clinical observations, patient history, and epidemiological information. The expected result is Negative. Fact Sheet for Patients: SugarRoll.be Fact Sheet for Healthcare Providers: https://www.woods-mathews.com/ This test is not yet approved or cleared by the [place] FDA and  has been authorized for detection and/or diagnosis of SARS-CoV-2 by FDA under an Emergency Use Authorization (EUA). This EUA will remain  in effect (meaning this test can be used) for the duration of the COVID-19 declaration under Section 56 4(b)(1) of the Act, 21 U.S.C. section 360bbb-3(b)(1), unless the authorization is terminated or revoked sooner. Performed at  [facility], [place] [address], [place], [place] [address]   MRSA PCR Screening     Status: None   Collection Time: 04/11/19  2:45 AM   Specimen: Nasal Mucosa; Nasopharyngeal  Result Value Ref Range Status   MRSA by PCR NEGATIVE NEGATIVE Final    Comment:        The GeneXpert MRSA Assay (FDA approved for NASAL specimens only), is one component of a comprehensive MRSA colonization surveillance program. It is not intended to diagnose MRSA infection nor to guide or monitor treatment for MRSA infections. Performed at [facility], [address] [place], [place] [address]     Coagulation Studies: No results for input(s): LABPROT, INR in the last 72 hours.  Urinalysis: No results for input(s): COLORURINE, LABSPEC, PHURINE, GLUCOSEU, HGBUR, BILIRUBINUR, KETONESUR, PROTEINUR, UROBILINOGEN, NITRITE, LEUKOCYTESUR in the last 72 hours.  Invalid input(s): APPERANCEUR    Imaging: No results found.   Medications:    . amLODipine  2.5 mg Oral Daily  . Chlorhexidine Gluconate Cloth  6 each Topical Q0600  . folic acid  1 mg Oral Daily  . heparin  5,000 Units Subcutaneous Q8H  . insulin aspart  0-5 Units Subcutaneous QHS  . insulin aspart  0-6 Units Subcutaneous TID WC  . insulin glargine  8 Units Subcutaneous Daily  . metoprolol tartrate  50 mg Oral BID  . pantoprazole  40 mg Oral Daily  . sertraline  100 mg Oral Daily  . sodium bicarbonate  650 mg Oral BID   acetaminophen **OR** acetaminophen, ondansetron (ZOFRAN) IV  Assessment/ Plan:  58 y.o. black male with diabetes mellitus type II, hypertension who is admitted to [facility] and initiated on hemodialysis.   1.  ESRD initiated HD.  Third treatment yesterday.  Next dialysis treatment for tomorrow.  - Discontinue sodium bicarbonate  2.  Anemia of chronic kidney disease. Not currently on EPO.  - Check CBC in AM  3.  Secondary hyperparathyroidism. With hypocalcemia and hypophosphatemia - continue to monitor.    4.  Hypokalemia: potassium of 3.  - 4 K bath  5. Diabetes mellitus type II with chronic kidney disease: hemoglobin A1c of 6.9%.   6. Hypertension:  - metoprolol   LOS: 4 Jessica Checketts 11/30/20201:04 PM

## 2019-04-13 NOTE — Progress Notes (Addendum)
Hypoglycemic Event  CBG: 57  Treatment: Gave gingerale  Symptoms: Asymptomatic   Follow-up CBG: Time:1800 CBG Result:  Possible Reasons for Event: decreased intake  Comments/MD notified:yes    Fuller Mandril, RN

## 2019-04-13 NOTE — Progress Notes (Signed)
PROGRESS NOTE    [name]  [id number] DOB: Mar 19, 1961 DOA: 04/08/2019 PCP: Reginald Harrier, MD   Brief Narrative:  [name] y.o.male,w hypertension, hyperlipidemia, DM2, CKD stage3, ANA positive, h/o R MCA CVA w chronic bilateral ICA occlusion, presents with c/o dehydration. Pt denies nsaid use or recent iv dye exposure.  Pt states that he had rash and was seen by Dr. Marcelino Scot at [facility] and had labs and sent to ER for evaluation of ARF  Prior spep negative for M spike 07/26/2018   Assessment & Plan:   Principal Problem:   ARF (acute renal failure) ([place]) Active Problems:   Acute renal failure superimposed on stage 3 chronic kidney disease ([place])   Uncontrolled diabetes mellitus ([place])   HTN (hypertension)   Acute renal failure (ARF) ([place])   ESRD (end stage renal disease) ([place])   Acute on chronic CKD 3/4 renal failure: Contholding Demadex, BICARB TABS OFF, neph following, Consultednephrology-dialysis cath placed, HD x 3 with planned fourth inpt tx tuesday D/c'd hydration Recheck BMP in the morning  Nonanion gap acidosis secondary to diarrhea Improving with dialysis This was consistent with volume loss and bicarb loss explaining patient's acute renal failure as above. Labs again in the morning [place]. difficile and GI pathogen panel  Hypokalemia Monitor cautious repletion given patient's acute renal failure Monitor BMP in the morning  Diabetes type 2 Continue sliding scale insulin Check blood glucose AC at bedtime  Diabetic neuropathy Gabapentin was stopped secondary renal insufficiency Consider restarting at renal driven dose pending recovery of renal function  Hypertension Agree with continuing amlodipine and stopping Demadex  GERD patient is on PPI which I will continue  Anxiety  Patient at baseline no acute decompensation Continue SSRI at 100 mg p.o. daily for Zoloft  DVT prophylaxis: SCD/Compression stockings  Code  Status: full    Code Status Orders  (From admission, onward)         Start     Ordered   04/09/19 0207  Full code  Continuous     04/09/19 0207        Code Status History    Date Active Date Inactive Code Status Order ID Comments User Context   03/04/2019 2110 03/07/2019 1654 Full Code [id number]  Lang Snow, NP ED   07/25/2018 1842 07/27/2018 1502 Full Code [id number]  Henreitta Leber, MD Inpatient   02/26/2018 2332 03/04/2018 1859 Full Code [id number]  Lance Coon, MD Inpatient   01/15/2018 2158 01/16/2018 1909 Full Code [id number]  Henreitta Leber, MD Inpatient   08/17/2017 0000 08/21/2017 1532 Full Code [id number]  Lance Coon, MD ED   06/29/2016 2124 07/01/2016 1805 Full Code [id number]  Vaughan Basta, MD Inpatient   Advance Care Planning Activity     Family Communication: Spoke with wife, she is interested in home health for follow-up checkup at home after discharge Disposition Plan:   Patient remained inpatient for continued dialysis, electrolyte monitoring and fluid management. Patient is not medically safe or ready for discharge Consults called: None Admission status: Inpatient   Consultants:   Nephrology, vascular surgery  Procedures:   No results found.  Antimicrobials:   Cefazolin perioperatively for placement of dialysis catheter   Subjective: No acute events overnight Has completed 3 treatments with nephrology want to keep patient in the hospital for planned fourth treatment tomorrow  Objective: Vitals:   04/12/19 1547 04/12/19 2001 04/13/19 0415 04/13/19 1134  BP: (!) 158/84 (!) 141/79 (!) 143/83 (!) 161/94  Pulse: 69 70  66 67  Resp: 18 16 18 18   Temp: 98.8 F (37.1 C) 99.1 F (37.3 C) 98.2 F (36.8 C) 98 F (36.7 C)  TempSrc: Oral Oral Oral Oral  SpO2: 100% 99% 100% 100%  Weight:   66.6 kg   Height:        Intake/Output Summary (Last 24 hours) at 04/13/2019 1140 Last data filed at 04/13/2019 1036 Gross per 24 hour   Intake 330 ml  Output 500 ml  Net -170 ml   Filed Weights   04/09/19 0455 04/12/19 0502 04/13/19 0415  Weight: 61.8 kg 67.9 kg 66.6 kg    Examination:  General exam:Appears calm and comfortable  Respiratory system: Clear to auscultation. Respiratory effort normal. Cardiovascular system:S1 &S2 heard, RRR. No JVD, murmurs, rubs, gallops or clicks. No pedal edema. Gastrointestinal system:Abdomen is nondistended, soft and nontender. No organomegaly or masses felt. Normal bowel sounds heard. Central nervous system:Alert and oriented. No focal neurological deficits. Extremities:Warm well perfused neurovascularly intact Skin:Dialysis catheter anterior right chest wall still with no evidence of bleeding or infection otherwise no rashes, lesions or ulcers Psychiatry:Judgement and insight appear normal. Mood &affect still remains flat     Data Reviewed: I have personally reviewed following labs and imaging studies  CBC: Recent Labs  Lab 04/08/19 1657 04/09/19 0529  WBC 7.2 6.2  HGB 9.9* 8.5*  HCT 31.3* 25.2*  MCV 94.0 89.4  PLT 314 [id number]   Basic Metabolic Panel: Recent Labs  Lab 04/08/19 1657  04/09/19 0529 04/10/19 0859 04/11/19 0420 04/11/19 1032 04/12/19 0427 04/13/19 0605  NA 135  --  136 137 136  --  139 139  K 3.4*  --  3.3* 2.8* 2.8*  --  3.0* 3.0*  CL 109  --  106 109 106  --  107 106  CO2 14*  --  21* 18* 22  --  24 26  GLUCOSE 294*  --  460* 84 328*  --  67* 166*  BUN 44*  --  49* 49* 38*  --  25* 12  CREATININE 6.31*   < > 6.13* 5.78* 4.61*  --  3.53* 2.44*  CALCIUM 7.3*  --  7.0* 7.1* 6.9*  --  7.2* 7.6*  MG 1.7  --   --  1.6* 1.6*  --  1.7 1.7  PHOS  --   --   --   --  2.9 2.3* 2.5 1.3*   < > = values in this interval not displayed.   GFR: Estimated Creatinine Clearance: 30.7 mL/min (A) (by C-G formula based on SCr of 2.44 mg/dL (H)). Liver Function Tests: Recent Labs  Lab 04/09/19 0529  AST 9*  ALT 7  ALKPHOS 99  BILITOT 0.4  PROT 6.6   ALBUMIN 2.0*   No results for input(s): LIPASE, AMYLASE in the last 168 hours. No results for input(s): AMMONIA in the last 168 hours. Coagulation Profile: No results for input(s): INR, PROTIME in the last 168 hours. Cardiac Enzymes: Recent Labs  Lab 04/09/19 0529  CKTOTAL 37*   BNP (last 3 results) No results for input(s): PROBNP in the last 8760 hours. HbA1C: No results for input(s): HGBA1C in the last 72 hours. CBG: Recent Labs  Lab 04/12/19 0735 04/12/19 1118 04/12/19 1555 04/12/19 2117 04/13/19 0729  GLUCAP 84 126* 82 145* 148*   Lipid Profile: No results for input(s): CHOL, HDL, LDLCALC, TRIG, CHOLHDL, LDLDIRECT in the last 72 hours. Thyroid Function Tests: No results for input(s): TSH, T4TOTAL, FREET4, T3FREE, THYROIDAB  in the last 72 hours. Anemia Panel: No results for input(s): VITAMINB12, FOLATE, FERRITIN, TIBC, IRON, RETICCTPCT in the last 72 hours. Sepsis Labs: No results for input(s): PROCALCITON, LATICACIDVEN in the last 168 hours.  Recent Results (from the past 240 hour(s))  SARS CORONAVIRUS 2 (TAT 6-24 HRS) Nasopharyngeal Nasopharyngeal Swab     Status: None   Collection Time: 04/08/19 10:49 PM   Specimen: Nasopharyngeal Swab  Result Value Ref Range Status   SARS Coronavirus 2 NEGATIVE NEGATIVE Final    Comment: (NOTE) SARS-CoV-2 target nucleic acids are NOT DETECTED. The SARS-CoV-2 RNA is generally detectable in upper and lower respiratory specimens during the acute phase of infection. Negative results do not preclude SARS-CoV-2 infection, do not rule out co-infections with other pathogens, and should not be used as the sole basis for treatment or other patient management decisions. Negative results must be combined with clinical observations, patient history, and epidemiological information. The expected result is Negative. Fact Sheet for Patients: SugarRoll.be Fact Sheet for Healthcare  Providers: https://www.woods-mathews.com/ This test is not yet approved or cleared by the [place] FDA and  has been authorized for detection and/or diagnosis of SARS-CoV-2 by FDA under an Emergency Use Authorization (EUA). This EUA will remain  in effect (meaning this test can be used) for the duration of the COVID-19 declaration under Section 56 4(b)(1) of the Act, 21 U.S.C. section 360bbb-3(b)(1), unless the authorization is terminated or revoked sooner. Performed at [facility], [place] [address] [place], [place] [address]   MRSA PCR Screening     Status: None   Collection Time: 04/11/19  2:45 AM   Specimen: Nasal Mucosa; Nasopharyngeal  Result Value Ref Range Status   MRSA by PCR NEGATIVE NEGATIVE Final    Comment:        The GeneXpert MRSA Assay (FDA approved for NASAL specimens only), is one component of a comprehensive MRSA colonization surveillance program. It is not intended to diagnose MRSA infection nor to guide or monitor treatment for MRSA infections. Performed at [facility], [address] [place], [place] [address]          Radiology Studies: No results found.      Scheduled Meds: . amLODipine  2.5 mg Oral Daily  . Chlorhexidine Gluconate Cloth  6 each Topical Q0600  . folic acid  1 mg Oral Daily  . heparin  5,000 Units Subcutaneous Q8H  . insulin aspart  0-5 Units Subcutaneous QHS  . insulin aspart  0-6 Units Subcutaneous TID WC  . insulin glargine  8 Units Subcutaneous Daily  . metoprolol tartrate  50 mg Oral BID  . pantoprazole  40 mg Oral Daily  . sertraline  100 mg Oral Daily  . sodium bicarbonate  650 mg Oral BID   Continuous Infusions:   LOS: 4 days    Time spent: 35 min    Nicolette Bang, MD [facility]  If 7PM-7AM, please contact night-coverage  04/13/2019, 11:40 AM

## 2019-04-13 NOTE — Progress Notes (Signed)
Hypoglycemic Event  CBG: 56  Treatment: Encouraged patient to drink more ginger ale and eat dinner  Symptoms: None  Follow-up CBG: Time: 1900 CBG Result: 71  Possible Reasons for Event: Inadequate meal intake  Comments/MD notified:yes    Fuller Mandril, RN

## 2019-04-13 NOTE — Progress Notes (Signed)
Following this patient for outpatient hemodialysis placement. Barrier is QuanitFeron still pending, also need Hep B total Core, dialysis RN to place this order. Please contact me with dialysis placement conerns.  Elvera Bicker Dialysis Coordinator [phone]

## 2019-04-13 NOTE — Progress Notes (Signed)
Faxed letter of excuse on behalf of patient to [phone] Attn: Shelia.   Fuller Mandril, RN

## 2019-04-13 NOTE — Care Management Important Message (Signed)
Important Message  Patient Details  Name: JANORRIS SOKOL MRN: [id number] Date of Birth: 06/10/1960   Medicare Important Message Given:  Yes     [name] A Vandana Haman 04/13/2019, 2:00 PM

## 2019-04-14 ENCOUNTER — Inpatient Hospital Stay: Payer: Medicare Other

## 2019-04-14 ENCOUNTER — Encounter: Payer: Self-pay | Admitting: Surgery

## 2019-04-14 DIAGNOSIS — N184 Chronic kidney disease, stage 4 (severe): Secondary | ICD-10-CM

## 2019-04-14 DIAGNOSIS — N170 Acute kidney failure with tubular necrosis: Secondary | ICD-10-CM

## 2019-04-14 LAB — BASIC METABOLIC PANEL
Anion gap: 7 (ref 5–15)
BUN: 14 mg/dL (ref 6–20)
CO2: 25 mmol/L (ref 22–32)
Calcium: 7.6 mg/dL — ABNORMAL LOW (ref 8.9–10.3)
Chloride: 108 mmol/L (ref 98–111)
Creatinine, Ser: 3.18 mg/dL — ABNORMAL HIGH (ref 0.61–1.24)
GFR calc Af Amer: 23 mL/min — ABNORMAL LOW (ref >60)
GFR calc non Af Amer: 20 mL/min — ABNORMAL LOW (ref >60)
Glucose, Bld: 88 mg/dL (ref 70–99)
Potassium: 3 mmol/L — ABNORMAL LOW (ref 3.5–5.1)
Sodium: 140 mmol/L (ref 135–145)

## 2019-04-14 LAB — GLUCOSE, CAPILLARY
Glucose-Capillary: 71 mg/dL (ref 70–99)
Glucose-Capillary: 68 mg/dL — ABNORMAL LOW (ref 70–99)
Glucose-Capillary: 147 mg/dL — ABNORMAL HIGH (ref 70–99)
Glucose-Capillary: 160 mg/dL — ABNORMAL HIGH (ref 70–99)

## 2019-04-14 LAB — HEPATITIS B CORE ANTIBODY, TOTAL: Hep B Core Total Ab: REACTIVE — AB

## 2019-04-14 LAB — CBC
HCT: 22.2 % — ABNORMAL LOW (ref 39.0–52.0)
Hemoglobin: 7.2 g/dL — ABNORMAL LOW (ref 13.0–17.0)
MCH: 29.5 pg (ref 26.0–34.0)
MCHC: 32.4 g/dL (ref 30.0–36.0)
MCV: 91 fL (ref 80.0–100.0)
Platelets: 247 10*3/uL (ref 150–400)
RBC: 2.44 MIL/uL — ABNORMAL LOW (ref 4.22–5.81)
RDW: 12.8 % (ref 11.5–15.5)
WBC: 5.3 10*3/uL (ref 4.0–10.5)
nRBC: 0 % (ref 0.0–0.2)

## 2019-04-14 LAB — MAGNESIUM: Magnesium: 1.6 mg/dL — ABNORMAL LOW (ref 1.7–2.4)

## 2019-04-14 LAB — PHOSPHORUS
Phosphorus: 1.3 mg/dL — ABNORMAL LOW (ref 2.5–4.6)
Phosphorus: 1.4 mg/dL — ABNORMAL LOW (ref 2.5–4.6)

## 2019-04-14 MED ORDER — EPOETIN ALFA 10000 UNIT/ML IJ SOLN: 4000.0000 [IU] | INTRAMUSCULAR | Status: DC

## 2019-04-14 MED ORDER — INSULIN GLARGINE 100 UNIT/ML ~~LOC~~ SOLN
4.0000 [IU] | Freq: Every day | SUBCUTANEOUS | Status: DC
  Administered 2019-04-14: 4 [IU] via SUBCUTANEOUS
  Filled 2019-04-14 (×2): qty 0.04

## 2019-04-14 MED ORDER — EPOETIN ALFA 10000 UNIT/ML IJ SOLN: 10000.0000 [IU] | INTRAMUSCULAR | Status: DC

## 2019-04-14 NOTE — Progress Notes (Signed)
PROGRESS NOTE    [name]  [id number] DOB: 24-Nov-1960 DOA: 04/08/2019 PCP: [name], MD Brief Narrative:  [name] y.o.male,w hypertension, hyperlipidemia, DM2, CKD stage3, ANA positive, h/o R MCA CVA w chronic bilateral ICA occlusion, presents with c/o dehydration. Pt denies nsaid use or recent iv dye exposure.  Pt states that he had rash and was seen by Dr. Marcelino Scot at [facility] and had labs and sent to ER for evaluation of ARF.   HD today, tolerated well.  Frustrated about continued hospital stay.  Pending outpatient HD facility acceptance and chair time.    Assessment & Plan:   Principal Problem:   ARF (acute renal failure) ([facility]) Active Problems:   Acute renal failure superimposed on stage 3 chronic kidney disease ([facility])   Uncontrolled diabetes mellitus ([facility])   HTN (hypertension)   Acute renal failure (ARF) ([facility])   ESRD (end stage renal disease) ([place])  Acute on chronic CKD 3/4 renal failure: Contholding Demadex BICARB TABS OFF Consultednephrology-dialysis cath placed HD x 4.  Cleared for discharge from nephrology standpoint Awaiting outpatient HD facility acceptance and chair time Recheck BMP in AM  Nonanion gap acidosis secondary to diarrhea Improving with dialysis This was consistent with volume loss and bicarb loss explaining patient's acute renal failure as above. Labs again in the morning [place]. difficile and GI pathogen panel  Hypokalemia Monitor cautious repletion given patient's acute renal failure MonitorBMP in the morning  Diabetes type 2 Continue sliding scale insulin Check blood glucose AC at bedtime  Diabetic neuropathy Gabapentin was stopped secondary renal insufficiency Hold for now, can consider restarting at later time and at reduced dose  Hypertension Continue Norvasc Stop demedex  GERD Continue home PPI  Anxiety  Patient at baseline no acute decompensation Continue SSRI at 100 mg p.o.  daily for Zoloft  DVT prophylaxis: SCD Code Status: FULL Family Communication: Discussed POC with patient Disposition Plan: Home pending outpatient HD chair  Consultants:   [place]- nephrology   Procedures: Vascath placement  Antimicrobials:   Subjective: Seen and examined Post HD Improved BP control Frustrated about continued hospital stay No new complaints  Objective: Vitals:   04/14/19 1230 04/14/19 1245 04/14/19 1300 04/14/19 1315  BP: (!) 166/89 (!) 156/100 (!) 155/89 (!) 164/105  Pulse: 67 67 (!) 53 64  Resp: 18 11 18 18   Temp:      TempSrc:      SpO2:      Weight:      Height:        Intake/Output Summary (Last 24 hours) at 04/14/2019 1605 Last data filed at 04/14/2019 1315 Gross per 24 hour  Intake 600 ml  Output 1075 ml  Net -475 ml   Filed Weights   04/12/19 0502 04/13/19 0415 04/14/19 0455  Weight: 67.9 kg 66.6 kg 63.5 kg    Examination:  General exam: Appears calm and comfortable  Respiratory system: Clear to auscultation. Respiratory effort normal. Cardiovascular system: S1 & S2 heard, RRR. No JVD, murmurs, rubs, gallops or clicks. No pedal edema. Gastrointestinal system: Abdomen is nondistended, soft and nontender. No organomegaly or masses felt. Normal bowel sounds heard. Central nervous system: Alert and oriented. No focal neurological deficits. Extremities: Symmetric 5 x 5 power. Skin: Vascath right chest wall, site clean Psychiatry: Judgement and insight appear normal. Mood & affect appropriate.     Data Reviewed: I have personally reviewed following labs and imaging studies  CBC: Recent Labs  Lab 04/08/19 1657 04/09/19 0529 04/14/19 1015  WBC  7.2 6.2 5.3  HGB 9.9* 8.5* 7.2*  HCT 31.3* 25.2* 22.2*  MCV 94.0 89.4 91.0  PLT 314 281 [id number]   Basic Metabolic Panel: Recent Labs  Lab 04/10/19 0859  04/11/19 0420 04/11/19 1032 04/12/19 0427 04/13/19 0605 04/14/19 0416 04/14/19 1015  NA 137  --  136  --  139 139 140  --   K  2.8*  --  2.8*  --  3.0* 3.0* 3.0*  --   CL 109  --  106  --  107 106 108  --   CO2 18*  --  22  --  24 26 25   --   GLUCOSE 84  --  328*  --  67* 166* 88  --   BUN 49*  --  38*  --  25* 12 14  --   CREATININE 5.78*  --  4.61*  --  3.53* 2.44* 3.18*  --   CALCIUM 7.1*  --  6.9*  --  7.2* 7.6* 7.6*  --   MG 1.6*  --  1.6*  --  1.7 1.7 1.6*  --   PHOS  --    < > 2.9 2.3* 2.5 1.3* 1.3* 1.4*   < > = values in this interval not displayed.   GFR: Estimated Creatinine Clearance: 22.5 mL/min (A) (by C-G formula based on SCr of 3.18 mg/dL (H)). Liver Function Tests: Recent Labs  Lab 04/09/19 0529  AST 9*  ALT 7  ALKPHOS 99  BILITOT 0.4  PROT 6.6  ALBUMIN 2.0*   No results for input(s): LIPASE, AMYLASE in the last 168 hours. No results for input(s): AMMONIA in the last 168 hours. Coagulation Profile: No results for input(s): INR, PROTIME in the last 168 hours. Cardiac Enzymes: Recent Labs  Lab 04/09/19 0529  CKTOTAL 37*   BNP (last 3 results) No results for input(s): PROBNP in the last 8760 hours. HbA1C: No results for input(s): HGBA1C in the last 72 hours. CBG: Recent Labs  Lab 04/13/19 2123 04/13/19 2152 04/13/19 2222 04/14/19 0740 04/14/19 0917  GLUCAP 49* 65* 140* 68* 147*   Lipid Profile: No results for input(s): CHOL, HDL, LDLCALC, TRIG, CHOLHDL, LDLDIRECT in the last 72 hours. Thyroid Function Tests: No results for input(s): TSH, T4TOTAL, FREET4, T3FREE, THYROIDAB in the last 72 hours. Anemia Panel: No results for input(s): VITAMINB12, FOLATE, FERRITIN, TIBC, IRON, RETICCTPCT in the last 72 hours. Sepsis Labs: No results for input(s): PROCALCITON, LATICACIDVEN in the last 168 hours.  Recent Results (from the past 240 hour(s))  SARS CORONAVIRUS 2 (TAT 6-24 HRS) Nasopharyngeal Nasopharyngeal Swab     Status: None   Collection Time: 04/08/19 10:49 PM   Specimen: Nasopharyngeal Swab  Result Value Ref Range Status   SARS Coronavirus 2 NEGATIVE NEGATIVE Final     Comment: (NOTE) SARS-CoV-2 target nucleic acids are NOT DETECTED. The SARS-CoV-2 RNA is generally detectable in upper and lower respiratory specimens during the acute phase of infection. Negative results do not preclude SARS-CoV-2 infection, do not rule out co-infections with other pathogens, and should not be used as the sole basis for treatment or other patient management decisions. Negative results must be combined with clinical observations, patient history, and epidemiological information. The expected result is Negative. Fact Sheet for Patients: SugarRoll.be Fact Sheet for Healthcare Providers: https://www.woods-mathews.com/ This test is not yet approved or cleared by the [place] FDA and  has been authorized for detection and/or diagnosis of SARS-CoV-2 by FDA under an Emergency Use Authorization (EUA). This  EUA will remain  in effect (meaning this test can be used) for the duration of the COVID-19 declaration under Section 56 4(b)(1) of the Act, 21 U.S.C. section 360bbb-3(b)(1), unless the authorization is terminated or revoked sooner. Performed at [facility], [place] [address], [place], [place] [address]   MRSA PCR Screening     Status: None   Collection Time: 04/11/19  2:45 AM   Specimen: Nasal Mucosa; Nasopharyngeal  Result Value Ref Range Status   MRSA by PCR NEGATIVE NEGATIVE Final    Comment:        The GeneXpert MRSA Assay (FDA approved for NASAL specimens only), is one component of a comprehensive MRSA colonization surveillance program. It is not intended to diagnose MRSA infection nor to guide or monitor treatment for MRSA infections. Performed at [facility], [address], [place], [place] [address]          Radiology Studies: No results found.      Scheduled Meds: . amLODipine  2.5 mg Oral Daily  . Chlorhexidine Gluconate Cloth  6 each Topical Q0600  . folic acid  1 mg Oral Daily   . heparin  5,000 Units Subcutaneous Q8H  . insulin aspart  0-5 Units Subcutaneous QHS  . insulin aspart  0-6 Units Subcutaneous TID WC  . insulin glargine  4 Units Subcutaneous Daily  . metoprolol tartrate  50 mg Oral BID  . pantoprazole  40 mg Oral Daily  . sertraline  100 mg Oral Daily   Continuous Infusions:   LOS: 5 days    Time spent: 35 minutes    Sidney Ace, MD [facility] Pager [phone]  If 7PM-7AM, please contact night-coverage [email or web address] Password [email or web address] 04/14/2019, 4:05 PM

## 2019-04-14 NOTE — Progress Notes (Signed)
   04/14/19 1006  Neurological  Level of Consciousness Alert  Orientation Level Oriented X4  Respiratory  Respiratory Pattern Regular;Unlabored  Bilateral Breath Sounds Clear;Diminished  Cardiac  Pulse Regular  Heart Sounds S1, S2  PT STABLE FOR HD TX IN CHAIR NO C/OS, CVC WDL UFG 1L

## 2019-04-14 NOTE — Progress Notes (Signed)
Front desk called stating patient's wife was at the main entrance wanting to talk with the patient and his nurse. Per chart wife is not the designated visitor. Called patient downstairs and per the front desk wife had left. Contacted wife on her cell phone. Per wife she had returned to work and did not have time to speak. She stated she would call back in an hour.   Fuller Mandril, RN

## 2019-04-14 NOTE — Progress Notes (Signed)
Inpatient Diabetes Program Recommendations  AACE/ADA: New Consensus Statement on Inpatient Glycemic Control (2015)  Target Ranges:  Prepandial:   less than 140 mg/dL      Peak postprandial:   less than 180 mg/dL (1-2 hours)      Critically ill patients:  140 - 180 mg/dL   Results for [name], [name] (MRN [id number]) as of 04/14/2019 10:11  Ref. Range 04/13/2019 07:29 04/13/2019 11:33 04/13/2019 16:54 04/13/2019 18:05 04/13/2019 18:46 04/13/2019 21:23 04/13/2019 21:52 04/13/2019 22:22  Glucose-Capillary Latest Ref Range: 70 - 99 mg/dL 148 (H) 180 (H)  1 unit NOVOLOG +  8 units LANTUS given at 10:41am  57 (L) 56 (L) 71 49 (L) 65 (L) 140 (H)   Results for [name], [name] (MRN [id number]) as of 04/14/2019 10:11  Ref. Range 04/14/2019 07:40  Glucose-Capillary Latest Ref Range: 70 - 99 mg/dL 68 (L)     Admit with: Acute renal failure on CKD stage3/4  History: DM, CKD3  Home DM Meds: Lantus 8 units Daily  Current Orders: Lantus 8 units Daily      Novolog 0-6 units TID AC + HS     New start to Dialysis this admission.    MD- Note patient with Hypoglycemia yesterday afternoon, yesterday at bedtime, and again this AM.  Please consider reducing Lantus to 4 units Daily (50% reduction)     --Will follow patient during hospitalization--  Wyn Quaker RN, MSN, CDE Diabetes Coordinator Inpatient Glycemic Control Team Team Pager: [phone] (8a-5p)

## 2019-04-14 NOTE — Progress Notes (Signed)
CBG: 49  Treatment: Encouraged patient to drink more ginger ale and eat snack  Symptoms: None  Follow-up   CBG Result: 65, repeat above -CBG 140 Possible Reasons for Event: Inadequate intake

## 2019-04-14 NOTE — Progress Notes (Signed)
[facility]  ROUNDING NOTE   Subjective:   Seen and examined on hemodialysis treatment. Tolerating treatment well. Seated in chair    HEMODIALYSIS FLOWSHEET:  Blood Flow Rate (mL/min): 300 mL/min Arterial Pressure (mmHg): -150 mmHg Venous Pressure (mmHg): 160 mmHg Transmembrane Pressure (mmHg): 70 mmHg Ultrafiltration Rate (mL/min): 330 mL/min Dialysate Flow Rate (mL/min): 660 ml/min Conductivity: Machine : 14 Conductivity: Machine : 14 Dialysis Fluid Bolus: Normal Saline Bolus Amount (mL): 250 mL    Objective:  Vital signs in last 24 hours:  Temp:  [98.7 F (37.1 C)-98.8 F (37.1 C)] 98.7 F (37.1 C) (12/01 0424) Pulse Rate:  [61-65] 61 (12/01 0424) Resp:  [16-20] 16 (12/01 0424) BP: (156-172)/(94-95) 156/95 (12/01 0424) SpO2:  [100 %] 100 % (12/01 0424) Weight:  [63.5 kg] 63.5 kg (12/01 0455)  Weight change: -3.051 kg Filed Weights   04/12/19 0502 04/13/19 0415 04/14/19 0455  Weight: 67.9 kg 66.6 kg 63.5 kg    Intake/Output: I/O last 3 completed shifts: In: 330 [P.O.:330] Out: 0    Intake/Output this shift:  Total I/O In: 480 [P.O.:480] Out: 75 [Urine:75]  Physical Exam: General: No acute distress, sitting in chair  Head: Normocephalic, atraumatic. Moist oral mucosal membranes  Eyes: Anicteric  Neck: Supple, trachea midline  Lungs:  Clear to auscultation, normal effort  Heart: regular  Abdomen:  Soft, nontender, bowel sounds present  Extremities: trace peripheral edema.  Neurologic: Awake, alert, following commands  Skin: No lesions  Access: RIJ permcath 11/27 Dr. Trula Slade    Basic Metabolic Panel: Recent Labs  Lab 04/10/19 0859  04/11/19 0420 04/11/19 1032 04/12/19 0427 04/13/19 0605 04/14/19 0416 04/14/19 1015  NA 137  --  136  --  139 139 140  --   K 2.8*  --  2.8*  --  3.0* 3.0* 3.0*  --   CL 109  --  106  --  107 106 108  --   CO2 18*  --  22  --  24 26 25   --   GLUCOSE 84  --  328*  --  67* 166* 88  --   BUN 49*  --   38*  --  25* 12 14  --   CREATININE 5.78*  --  4.61*  --  3.53* 2.44* 3.18*  --   CALCIUM 7.1*  --  6.9*  --  7.2* 7.6* 7.6*  --   MG 1.6*  --  1.6*  --  1.7 1.7 1.6*  --   PHOS  --    < > 2.9 2.3* 2.5 1.3* 1.3* 1.4*   < > = values in this interval not displayed.    Liver Function Tests: Recent Labs  Lab 04/09/19 0529  AST 9*  ALT 7  ALKPHOS 99  BILITOT 0.4  PROT 6.6  ALBUMIN 2.0*   No results for input(s): LIPASE, AMYLASE in the last 168 hours. No results for input(s): AMMONIA in the last 168 hours.  CBC: Recent Labs  Lab 04/08/19 1657 04/09/19 0529 04/14/19 1015  WBC 7.2 6.2 5.3  HGB 9.9* 8.5* 7.2*  HCT 31.3* 25.2* 22.2*  MCV 94.0 89.4 91.0  PLT 314 281 247    Cardiac Enzymes: Recent Labs  Lab 04/09/19 0529  CKTOTAL 37*    BNP: Invalid input(s): POCBNP  CBG: Recent Labs  Lab 04/13/19 2123 04/13/19 2152 04/13/19 2222 04/14/19 0740 04/14/19 0917  GLUCAP 49* 65* 140* 21* 147*    Microbiology: Results for orders placed or performed during  the hospital encounter of 04/08/19  SARS CORONAVIRUS 2 (TAT 6-24 HRS) Nasopharyngeal Nasopharyngeal Swab     Status: None   Collection Time: 04/08/19 10:49 PM   Specimen: Nasopharyngeal Swab  Result Value Ref Range Status   SARS Coronavirus 2 NEGATIVE NEGATIVE Final    Comment: (NOTE) SARS-CoV-2 target nucleic acids are NOT DETECTED. The SARS-CoV-2 RNA is generally detectable in upper and lower respiratory specimens during the acute phase of infection. Negative results do not preclude SARS-CoV-2 infection, do not rule out co-infections with other pathogens, and should not be used as the sole basis for treatment or other patient management decisions. Negative results must be combined with clinical observations, patient history, and epidemiological information. The expected result is Negative. Fact Sheet for Patients: SugarRoll.be Fact Sheet for Healthcare  Providers: https://www.woods-mathews.com/ This test is not yet approved or cleared by the [place] FDA and  has been authorized for detection and/or diagnosis of SARS-CoV-2 by FDA under an Emergency Use Authorization (EUA). This EUA will remain  in effect (meaning this test can be used) for the duration of the COVID-19 declaration under Section 56 4(b)(1) of the Act, 21 U.S.C. section 360bbb-3(b)(1), unless the authorization is terminated or revoked sooner. Performed at [facility], [place] [address] [place], [place] [address]   MRSA PCR Screening     Status: None   Collection Time: 04/11/19  2:45 AM   Specimen: Nasal Mucosa; Nasopharyngeal  Result Value Ref Range Status   MRSA by PCR NEGATIVE NEGATIVE Final    Comment:        The GeneXpert MRSA Assay (FDA approved for NASAL specimens only), is one component of a comprehensive MRSA colonization surveillance program. It is not intended to diagnose MRSA infection nor to guide or monitor treatment for MRSA infections. Performed at [facility], [address] [place], [place] [address]     Coagulation Studies: No results for input(s): LABPROT, INR in the last 72 hours.  Urinalysis: No results for input(s): COLORURINE, LABSPEC, PHURINE, GLUCOSEU, HGBUR, BILIRUBINUR, KETONESUR, PROTEINUR, UROBILINOGEN, NITRITE, LEUKOCYTESUR in the last 72 hours.  Invalid input(s): APPERANCEUR    Imaging: No results found.   Medications:    . amLODipine  2.5 mg Oral Daily  . Chlorhexidine Gluconate Cloth  6 each Topical Q0600  . epoetin (EPOGEN/PROCRIT) injection  4,000 Units Intravenous Q T,Th,Sa-HD  . folic acid  1 mg Oral Daily  . heparin  5,000 Units Subcutaneous Q8H  . insulin aspart  0-5 Units Subcutaneous QHS  . insulin aspart  0-6 Units Subcutaneous TID WC  . [START ON 04/15/2019] insulin glargine  4 Units Subcutaneous Daily  . metoprolol tartrate  50 mg Oral BID  . pantoprazole  40 mg Oral  Daily  . sertraline  100 mg Oral Daily  . sodium bicarbonate  650 mg Oral BID   acetaminophen **OR** acetaminophen, ondansetron (ZOFRAN) IV  Assessment/ Plan:  58 y.o. black male with diabetes mellitus type II, hypertension who is admitted to [facility] and initiated on hemodialysis.   1.  ESRD initiated HD.  Seen and examined on hemodialysis treatment. Seated in chair. Tolerating treatment well.  Outpatient planning for TTS at [place].   2.  Anemia of chronic kidney disease. Hemoglobin 7.2 - EPO ordered  3.  Secondary hyperparathyroidism. With hypocalcemia and hypophosphatemia - continue to monitor.   4.  Hypokalemia: potassium of 3.  - 4 K bath  5. Diabetes mellitus type II with chronic kidney disease: hemoglobin A1c  of 6.9%.   6. Hypertension:  - metoprolol   LOS: 5 Carole Doner 12/1/202011:49 AM

## 2019-04-14 NOTE — Progress Notes (Signed)
Spoke with patient, he chose to go to [facility], referral sent. Waiting on clinic acceptance and chair time.

## 2019-04-14 NOTE — Progress Notes (Addendum)
Patient requesting to leave AMA. MD made aware. Per Elvera Bicker, dialysis coordinator, request has been sent to [facility] in [place] for 0600 chair time this Thursday. Only caveat is [facility] requires CXR prior to treatment. CXR results pending.    Fuller Mandril, RN

## 2019-04-14 NOTE — Progress Notes (Signed)
CXR results faxed to Elvera Bicker per her request.   Fuller Mandril, RN

## 2019-04-14 NOTE — Progress Notes (Signed)
   04/14/19 1315  Vital Signs  Pulse Rate 64  Pulse Rate Source Monitor  Resp 18  BP (!) 164/105  During Hemodialysis Assessment  Blood Flow Rate (mL/min) 400 mL/min  Arterial Pressure (mmHg) -180 mmHg  Venous Pressure (mmHg) 130 mmHg  Transmembrane Pressure (mmHg) 60 mmHg  Ultrafiltration Rate (mL/min) 500 mL/min  Dialysate Flow Rate (mL/min) 600 ml/min  Conductivity: Machine  13.8  HD Safety Checks Performed Yes  KECN 62.4 KECN  Dialysis Fluid Bolus Normal Saline  Bolus Amount (mL) 250 mL  Intra-Hemodialysis Comments Progressing as prescribed;Tolerated well;Tx completed  Post-Hemodialysis Assessment  Rinseback Volume (mL) 250 mL  KECN 62.4 V  Dialyzer Clearance Clear  Duration of HD Treatment -hour(s) 3 hour(s)  Hemodialysis Intake (mL) 500 mL  UF Total -Machine (mL) 1500 mL  Net UF (mL) 1000 mL  Tolerated HD Treatment Yes  Hemodialysis Catheter Right Internal jugular Double lumen Permanent (Tunneled)  Placement Date/Time: 04/10/19 1440   Placed prior to admission: Yes  Time Out: Correct patient;Correct site;Correct procedure  Maximum sterile barrier precautions: Hand hygiene;Cap;Sterile gown;Mask;Sterile gloves;Large sterile sheet  Site Prep: Chlor...  Blue Lumen Status Flushed;Heparin locked;Capped (Central line)  Red Lumen Status Flushed;Capped (Central line);Heparin locked  PT TOLERATED HD WELL NO C/OS CVC WDL UFG 1L STABLE FOR D/C

## 2019-04-14 NOTE — Progress Notes (Signed)
Updated patient that wife was no longer available to pick him up. Patient called Sister for ride. Sister immediately contacted myself very upset with the situation. Per Sister the patient was not going to leave tonight. Patient was notified by Sister. Per patient he will be staying tonight. MD made aware.   Fuller Mandril, RN

## 2019-04-15 DIAGNOSIS — N2889 Other specified disorders of kidney and ureter: Secondary | ICD-10-CM

## 2019-04-15 DIAGNOSIS — I151 Hypertension secondary to other renal disorders: Secondary | ICD-10-CM

## 2019-04-15 LAB — PHOSPHORUS: Phosphorus: 1.1 mg/dL — ABNORMAL LOW (ref 2.5–4.6)

## 2019-04-15 LAB — QUANTIFERON-TB GOLD PLUS (RQFGPL)
QuantiFERON Mitogen Value: 0.13 IU/mL
QuantiFERON Nil Value: 0.03 IU/mL
QuantiFERON TB1 Ag Value: 0.03 IU/mL
QuantiFERON TB2 Ag Value: 0.03 IU/mL

## 2019-04-15 LAB — MAGNESIUM: Magnesium: 1.5 mg/dL — ABNORMAL LOW (ref 1.7–2.4)

## 2019-04-15 LAB — GLUCOSE, CAPILLARY
Glucose-Capillary: 55 mg/dL — ABNORMAL LOW (ref 70–99)
Glucose-Capillary: 63 mg/dL — ABNORMAL LOW (ref 70–99)
Glucose-Capillary: 145 mg/dL — ABNORMAL HIGH (ref 70–99)
Glucose-Capillary: 178 mg/dL — ABNORMAL HIGH (ref 70–99)

## 2019-04-15 LAB — BASIC METABOLIC PANEL
Anion gap: 8 (ref 5–15)
BUN: 8 mg/dL (ref 6–20)
CO2: 29 mmol/L (ref 22–32)
Calcium: 7.6 mg/dL — ABNORMAL LOW (ref 8.9–10.3)
Chloride: 103 mmol/L (ref 98–111)
Creatinine, Ser: 2.51 mg/dL — ABNORMAL HIGH (ref 0.61–1.24)
GFR calc Af Amer: 31 mL/min — ABNORMAL LOW (ref >60)
GFR calc non Af Amer: 27 mL/min — ABNORMAL LOW (ref >60)
Glucose, Bld: 89 mg/dL (ref 70–99)
Potassium: 3 mmol/L — ABNORMAL LOW (ref 3.5–5.1)
Sodium: 140 mmol/L (ref 135–145)

## 2019-04-15 LAB — QUANTIFERON-TB GOLD PLUS: QuantiFERON-TB Gold Plus: UNDETERMINED — AB

## 2019-04-15 MED ORDER — AMLODIPINE BESYLATE 5 MG PO TABS: 5.0000 mg | ORAL_TABLET | Freq: Every day | ORAL | 0 refills | Status: DC

## 2019-04-15 MED ORDER — AMLODIPINE BESYLATE 5 MG PO TABS: 5.0000 mg | ORAL_TABLET | Freq: Every day | ORAL | Status: DC

## 2019-04-15 MED ORDER — AMLODIPINE BESYLATE 5 MG PO TABS
2.5000 mg | ORAL_TABLET | Freq: Once | ORAL | Status: AC
  Administered 2019-04-15: 2.5 mg via ORAL
  Filled 2019-04-15: qty 1

## 2019-04-15 NOTE — Discharge Summary (Signed)
Physician Discharge Summary  [name] [id number] DOB: July 30, 1960 DOA: 04/08/2019  PCP: [name], MD  Admit date: 04/08/2019 Discharge date: 04/15/2019  Admitted From: Home Disposition:  Home  Recommendations for Outpatient Follow-up:  1. Follow up with PCP in 1-2 weeks 2. Please obtain BMP/CBC in one week 3. Please follow up on the following pending results:  Home Health:NO Equipment/Devices:Vascath Discharge Condition: Stable CODE STATUS: FULL Diet recommendation: Heart Healthy / Carb Modified  Brief/Interim Summary: [name] a58 y.o.male,w hypertension, hyperlipidemia, DM2, CKD stage3, ANA positive, h/o R MCA CVA w chronic bilateral ICA occlusion, presents with c/o dehydration. Pt denies nsaid use or recent iv dye exposure.  Pt states that he had rash and was seen by Dr. Marcelino Scot at [facility] and had labs and sent to ER for evaluation of ARF.   Patient underwent temporary dialysis access placement while admitted.  Tolerated procedure well with no complications.  Nephrology was involved and the patient underwent hemodialysis x3 while inpatient.  Tolerated well.  Some issues with blood pressure management however improved at time of discharge.  Patient was discharged home once confirmation of outpatient hemodialysis chair and time were confirmed.  Patient will be scheduled for dialysis at [facility] on [address] Tuesday Thursday Saturday at 6 AM.   Discharge Diagnoses:  Principal Problem:   ARF (acute renal failure) ([place]) Active Problems:   Acute renal failure superimposed on stage 4 chronic kidney disease ([place])   Uncontrolled diabetes mellitus ([place])   HTN (hypertension)   Acute renal failure (ARF) ([place])   ESRD (end stage renal disease) ([place])    Discharge Instructions  Discharge Instructions    Diet - low sodium heart healthy   Complete by: As directed    Discharge instructions   Complete by: As directed    Please ensure you  follow up for dialysis at [facility] per the schedule   Increase activity slowly   Complete by: As directed      Allergies as of 04/15/2019      Reactions   Ferrous Gluconate Nausea And Vomiting      Medication List    STOP taking these medications   gabapentin 300 MG capsule Commonly known as: NEURONTIN   sodium bicarbonate 650 MG tablet   torsemide 20 MG tablet Commonly known as: DEMADEX     TAKE these medications   amLODipine 5 MG tablet Commonly known as: NORVASC Take 1 tablet (5 mg total) by mouth daily. Start taking on: April 16, 2019 What changed:   medication strength  how much to take   folic acid 1 MG tablet Commonly known as: FOLVITE Take 1 mg by mouth daily.   hydrocortisone 2.5 % cream Apply 1 application topically 2 (two) times daily. (apply to rash for 10 days)   insulin glargine 100 UNIT/ML injection Commonly known as: LANTUS Inject 0.15 mLs (15 Units total) into the skin daily. What changed: how much to take   metoprolol tartrate 50 MG tablet Commonly known as: LOPRESSOR Take 50 mg by mouth 2 (two) times daily.   pantoprazole 40 MG tablet Commonly known as: PROTONIX Take 40 mg by mouth daily.   sertraline 100 MG tablet Commonly known as: ZOLOFT Take 100 mg by mouth daily.      Follow-up Information    Kris Hartmann, NP Follow up in 1 month(s).   Specialty: Vascular Surgery Why: Seen as consult. Has permcath. Will need bilateral upper extremity vein mapping.  Contact information: [address]  [place] [address] [phone]        Dialysis, [facility]. Go in 1 day(s).   Why: 6:00 am Contact information: [address] [place] [address] [phone]          Allergies  Allergen Reactions  . Ferrous Gluconate Nausea And Vomiting    Consultations:  Nephrology: [facility], Dr. Juleen China   Procedures/Studies: Dg Chest 2 View  Result Date: 04/14/2019 CLINICAL DATA:  [name] is a 58 y.o.  male, w hypertension, hyperlipidemia, DM2, CKD stage3, ANA positive, h/o R MCA CVA w chronic bilateral ICA occlusion, presents with c/o dehydration. Pt states that he had rash and was seen by Dr. Marcelino Scot at [facility] and had labs and sent to ER for evaluation of ARFHx of TB EXAM: CHEST - 2 VIEW COMPARISON:  02/26/2018 FINDINGS: Cardiac silhouette is normal in size. No mediastinal or hilar masses. No evidence of adenopathy. Clear lungs.  No pleural effusion or pneumothorax. Right internal jugular dual-lumen tunneled central venous catheter has its tip projecting just above the caval atrial junction. This is new from the prior study. Skeletal structures are intact. IMPRESSION: 1. No acute cardiopulmonary disease. Electronically Signed   By: Lajean Manes M.D.   On: 04/14/2019 16:19    (Echo, Carotid, EGD, Colonoscopy, ERCP)    Subjective:   Discharge Exam: Vitals:   04/15/19 1128 04/15/19 1238  BP: (!) 153/85 (!) 136/93  Pulse: 65   Resp:    Temp: 98 F (36.7 C)   SpO2: 100%    Vitals:   04/15/19 0552 04/15/19 0828 04/15/19 1128 04/15/19 1238  BP: (!) 174/95 (!) 174/92 (!) 153/85 (!) 136/93  Pulse: (!) 57 67 65   Resp: 20     Temp: 98.4 F (36.9 C)  98 F (36.7 C)   TempSrc: Oral  Oral   SpO2: 100%  100%   Weight:      Height:        General: Pt is alert, awake, not in acute distress Cardiovascular: RRR, S1/S2 +, no rubs, no gallops Respiratory: CTA bilaterally, no wheezing, no rhonchi Abdominal: Soft, NT, ND, bowel sounds + Extremities: no edema, no cyanosis    The results of significant diagnostics from this hospitalization (including imaging, microbiology, ancillary and laboratory) are listed below for reference.     Microbiology: Recent Results (from the past 240 hour(s))  SARS CORONAVIRUS 2 (TAT 6-24 HRS) Nasopharyngeal Nasopharyngeal Swab     Status: None   Collection Time: 04/08/19 10:49 PM   Specimen: Nasopharyngeal Swab  Result Value Ref Range Status    SARS Coronavirus 2 NEGATIVE NEGATIVE Final    Comment: (NOTE) SARS-CoV-2 target nucleic acids are NOT DETECTED. The SARS-CoV-2 RNA is generally detectable in upper and lower respiratory specimens during the acute phase of infection. Negative results do not preclude SARS-CoV-2 infection, do not rule out co-infections with other pathogens, and should not be used as the sole basis for treatment or other patient management decisions. Negative results must be combined with clinical observations, patient history, and epidemiological information. The expected result is Negative. Fact Sheet for Patients: SugarRoll.be Fact Sheet for Healthcare Providers: https://www.woods-mathews.com/ This test is not yet approved or cleared by the [place] FDA and  has been authorized for detection and/or diagnosis of SARS-CoV-2 by FDA under an Emergency Use Authorization (EUA). This EUA will remain  in effect (meaning this test can be used) for the duration of the COVID-19 declaration under Section 56 4(b)(1) of the Act,  21 U.S.C. section 360bbb-3(b)(1), unless the authorization is terminated or revoked sooner. Performed at [facility], [place] [address] [place], [place] [address]   MRSA PCR Screening     Status: None   Collection Time: 04/11/19  2:45 AM   Specimen: Nasal Mucosa; Nasopharyngeal  Result Value Ref Range Status   MRSA by PCR NEGATIVE NEGATIVE Final    Comment:        The GeneXpert MRSA Assay (FDA approved for NASAL specimens only), is one component of a comprehensive MRSA colonization surveillance program. It is not intended to diagnose MRSA infection nor to guide or monitor treatment for MRSA infections. Performed at [facility], [address] [place], [place] [address]      Labs: BNP (last 3 results) No results for input(s): BNP in the last 8760 hours. Basic Metabolic Panel: Recent Labs  Lab 04/11/19 0420   04/12/19 0427 04/13/19 0605 04/14/19 0416 04/14/19 1015 04/15/19 0338  NA 136  --  139 139 140  --  140  K 2.8*  --  3.0* 3.0* 3.0*  --  3.0*  CL 106  --  107 106 108  --  103  CO2 22  --  24 26 25   --  29  GLUCOSE 328*  --  67* 166* 88  --  89  BUN 38*  --  25* 12 14  --  8  CREATININE 4.61*  --  3.53* 2.44* 3.18*  --  2.51*  CALCIUM 6.9*  --  7.2* 7.6* 7.6*  --  7.6*  MG 1.6*  --  1.7 1.7 1.6*  --  1.5*  PHOS 2.9   < > 2.5 1.3* 1.3* 1.4* 1.1*   < > = values in this interval not displayed.   Liver Function Tests: Recent Labs  Lab 04/09/19 0529  AST 9*  ALT 7  ALKPHOS 99  BILITOT 0.4  PROT 6.6  ALBUMIN 2.0*   No results for input(s): LIPASE, AMYLASE in the last 168 hours. No results for input(s): AMMONIA in the last 168 hours. CBC: Recent Labs  Lab 04/08/19 1657 04/09/19 0529 04/14/19 1015  WBC 7.2 6.2 5.3  HGB 9.9* 8.5* 7.2*  HCT 31.3* 25.2* 22.2*  MCV 94.0 89.4 91.0  PLT 314 281 247   Cardiac Enzymes: Recent Labs  Lab 04/09/19 0529  CKTOTAL 37*   BNP: Invalid input(s): POCBNP CBG: Recent Labs  Lab 04/14/19 2037 04/15/19 0749 04/15/19 0824 04/15/19 0913 04/15/19 1126  GLUCAP 160* 55* 63* 145* 178*   D-Dimer No results for input(s): DDIMER in the last 72 hours. Hgb A1c No results for input(s): HGBA1C in the last 72 hours. Lipid Profile No results for input(s): CHOL, HDL, LDLCALC, TRIG, CHOLHDL, LDLDIRECT in the last 72 hours. Thyroid function studies No results for input(s): TSH, T4TOTAL, T3FREE, THYROIDAB in the last 72 hours.  Invalid input(s): FREET3 Anemia work up No results for input(s): VITAMINB12, FOLATE, FERRITIN, TIBC, IRON, RETICCTPCT in the last 72 hours. Urinalysis    Component Value Date/Time   COLORURINE YELLOW (A) 04/09/2019 0123   APPEARANCEUR HAZY (A) 04/09/2019 0123   APPEARANCEUR Clear 01/17/2014 1551   LABSPEC 1.012 04/09/2019 0123   LABSPEC 1.011 01/17/2014 1551   PHURINE 5.0 04/09/2019 0123   GLUCOSEU >=500 (A)  04/09/2019 0123   GLUCOSEU Negative 01/17/2014 1551   HGBUR SMALL (A) 04/09/2019 0123   BILIRUBINUR NEGATIVE 04/09/2019 0123   BILIRUBINUR Negative 01/17/2014 [place] NEGATIVE 04/09/2019 0123  PROTEINUR >=300 (A) 04/09/2019 0123   NITRITE NEGATIVE 04/09/2019 0123   LEUKOCYTESUR TRACE (A) 04/09/2019 0123   LEUKOCYTESUR 1+ 01/17/2014 1551   Sepsis Labs Invalid input(s): PROCALCITONIN,  WBC,  LACTICIDVEN Microbiology Recent Results (from the past 240 hour(s))  SARS CORONAVIRUS 2 (TAT 6-24 HRS) Nasopharyngeal Nasopharyngeal Swab     Status: None   Collection Time: 04/08/19 10:49 PM   Specimen: Nasopharyngeal Swab  Result Value Ref Range Status   SARS Coronavirus 2 NEGATIVE NEGATIVE Final    Comment: (NOTE) SARS-CoV-2 target nucleic acids are NOT DETECTED. The SARS-CoV-2 RNA is generally detectable in upper and lower respiratory specimens during the acute phase of infection. Negative results do not preclude SARS-CoV-2 infection, do not rule out co-infections with other pathogens, and should not be used as the sole basis for treatment or other patient management decisions. Negative results must be combined with clinical observations, patient history, and epidemiological information. The expected result is Negative. Fact Sheet for Patients: SugarRoll.be Fact Sheet for Healthcare Providers: https://www.woods-mathews.com/ This test is not yet approved or cleared by the [place] FDA and  has been authorized for detection and/or diagnosis of SARS-CoV-2 by FDA under an Emergency Use Authorization (EUA). This EUA will remain  in effect (meaning this test can be used) for the duration of the COVID-19 declaration under Section 56 4(b)(1) of the Act, 21 U.S.C. section 360bbb-3(b)(1), unless the authorization is terminated or revoked sooner. Performed at [facility], [place] [address], [place], [place] [address]   MRSA PCR  Screening     Status: None   Collection Time: 04/11/19  2:45 AM   Specimen: Nasal Mucosa; Nasopharyngeal  Result Value Ref Range Status   MRSA by PCR NEGATIVE NEGATIVE Final    Comment:        The GeneXpert MRSA Assay (FDA approved for NASAL specimens only), is one component of a comprehensive MRSA colonization surveillance program. It is not intended to diagnose MRSA infection nor to guide or monitor treatment for MRSA infections. Performed at [facility], [address], [place], [place] [address]      Time coordinating discharge: Over 30 minutes  SIGNED:   Sidney Ace, MD  [facility] 04/15/2019, 3:14 PM Pager [phone]  If 7PM-7AM, please contact night-coverage [email or web address] Password [email or web address]

## 2019-04-15 NOTE — Progress Notes (Signed)
[name] to be D/C'd Home per MD order.  Discussed prescriptions and follow up appointments with the patient. Prescriptions given to patient, medication list explained in detail. Pt verbalized understanding.  Allergies as of 04/15/2019      Reactions   Ferrous Gluconate Nausea And Vomiting      Medication List    STOP taking these medications   gabapentin 300 MG capsule Commonly known as: NEURONTIN   sodium bicarbonate 650 MG tablet   torsemide 20 MG tablet Commonly known as: DEMADEX     TAKE these medications   amLODipine 5 MG tablet Commonly known as: NORVASC Take 1 tablet (5 mg total) by mouth daily. Start taking on: April 16, 2019 What changed:   medication strength  how much to take   folic acid 1 MG tablet Commonly known as: FOLVITE Take 1 mg by mouth daily.   hydrocortisone 2.5 % cream Apply 1 application topically 2 (two) times daily. (apply to rash for 10 days)   insulin glargine 100 UNIT/ML injection Commonly known as: LANTUS Inject 0.15 mLs (15 Units total) into the skin daily. What changed: how much to take   metoprolol tartrate 50 MG tablet Commonly known as: LOPRESSOR Take 50 mg by mouth 2 (two) times daily.   pantoprazole 40 MG tablet Commonly known as: PROTONIX Take 40 mg by mouth daily.   sertraline 100 MG tablet Commonly known as: ZOLOFT Take 100 mg by mouth daily.       Vitals:   04/15/19 1128 04/15/19 1238  BP: (!) 153/85 (!) 136/93  Pulse: 65   Resp:    Temp: 98 F (36.7 C)   SpO2: 100%     Skin clean, dry and intact without evidence of skin break down, no evidence of skin tears noted. IV catheter discontinued intact. Site without signs and symptoms of complications. Dressing and pressure applied. Pt denies pain at this time. No complaints noted.  An After Visit Summary was printed and given to the patient. Patient escorted via [facility], and D/C home via private auto.  [name] 04/15/2019 12:54 PM

## 2019-04-15 NOTE — Progress Notes (Signed)
[facility]  ROUNDING NOTE   Subjective:   Hemodialysis treatment yesterday. Tolerated treatment well in a chair.    Objective:  Vital signs in last 24 hours:  Temp:  [98 F (36.7 C)-98.7 F (37.1 C)] 98 F (36.7 C) (12/02 1128) Pulse Rate:  [57-69] 65 (12/02 1128) Resp:  [20] 20 (12/02 0552) BP: (136-174)/(85-99) 136/93 (12/02 1238) SpO2:  [100 %] 100 % (12/02 1128)  Weight change:  Filed Weights   04/12/19 0502 04/13/19 0415 04/14/19 0455  Weight: 67.9 kg 66.6 kg 63.5 kg    Intake/Output: I/O last 3 completed shifts: In: 600 [P.O.:600] Out: 1075 [Urine:75; Other:1000]   Intake/Output this shift:  No intake/output data recorded.  Physical Exam: General: No acute distress, sitting in chair  Head: Normocephalic, atraumatic. Moist oral mucosal membranes  Eyes: Anicteric  Neck: Supple, trachea midline  Lungs:  Clear to auscultation, normal effort  Heart: regular  Abdomen:  Soft, nontender, bowel sounds present  Extremities: trace peripheral edema.  Neurologic: Awake, alert, following commands  Skin: No lesions  Access: RIJ permcath 11/27 Dr. Trula Slade    Basic Metabolic Panel: Recent Labs  Lab 04/11/19 0420  04/12/19 0427 04/13/19 0605 04/14/19 0416 04/14/19 1015 04/15/19 0338  NA 136  --  139 139 140  --  140  K 2.8*  --  3.0* 3.0* 3.0*  --  3.0*  CL 106  --  107 106 108  --  103  CO2 22  --  24 26 25   --  29  GLUCOSE 328*  --  67* 166* 88  --  89  BUN 38*  --  25* 12 14  --  8  CREATININE 4.61*  --  3.53* 2.44* 3.18*  --  2.51*  CALCIUM 6.9*  --  7.2* 7.6* 7.6*  --  7.6*  MG 1.6*  --  1.7 1.7 1.6*  --  1.5*  PHOS 2.9   < > 2.5 1.3* 1.3* 1.4* 1.1*   < > = values in this interval not displayed.    Liver Function Tests: Recent Labs  Lab 04/09/19 0529  AST 9*  ALT 7  ALKPHOS 99  BILITOT 0.4  PROT 6.6  ALBUMIN 2.0*   No results for input(s): LIPASE, AMYLASE in the last 168 hours. No results for input(s): AMMONIA in the last 168  hours.  CBC: Recent Labs  Lab 04/08/19 1657 04/09/19 0529 04/14/19 1015  WBC 7.2 6.2 5.3  HGB 9.9* 8.5* 7.2*  HCT 31.3* 25.2* 22.2*  MCV 94.0 89.4 91.0  PLT 314 281 247    Cardiac Enzymes: Recent Labs  Lab 04/09/19 0529  CKTOTAL 37*    BNP: Invalid input(s): POCBNP  CBG: Recent Labs  Lab 04/14/19 2037 04/15/19 0749 04/15/19 0824 04/15/19 0913 04/15/19 1126  GLUCAP 160* 55* 63* 145* 178*    Microbiology: Results for orders placed or performed during the hospital encounter of 04/08/19  SARS CORONAVIRUS 2 (TAT 6-24 HRS) Nasopharyngeal Nasopharyngeal Swab     Status: None   Collection Time: 04/08/19 10:49 PM   Specimen: Nasopharyngeal Swab  Result Value Ref Range Status   SARS Coronavirus 2 NEGATIVE NEGATIVE Final    Comment: (NOTE) SARS-CoV-2 target nucleic acids are NOT DETECTED. The SARS-CoV-2 RNA is generally detectable in upper and lower respiratory specimens during the acute phase of infection. Negative results do not preclude SARS-CoV-2 infection, do not rule out co-infections with other pathogens, and should not be used as the sole basis for treatment  or other patient management decisions. Negative results must be combined with clinical observations, patient history, and epidemiological information. The expected result is Negative. Fact Sheet for Patients: SugarRoll.be Fact Sheet for Healthcare Providers: https://www.woods-mathews.com/ This test is not yet approved or cleared by the [place] FDA and  has been authorized for detection and/or diagnosis of SARS-CoV-2 by FDA under an Emergency Use Authorization (EUA). This EUA will remain  in effect (meaning this test can be used) for the duration of the COVID-19 declaration under Section 56 4(b)(1) of the Act, 21 U.S.C. section 360bbb-3(b)(1), unless the authorization is terminated or revoked sooner. Performed at [facility], [place] [address],  [place], [place] [address]   MRSA PCR Screening     Status: None   Collection Time: 04/11/19  2:45 AM   Specimen: Nasal Mucosa; Nasopharyngeal  Result Value Ref Range Status   MRSA by PCR NEGATIVE NEGATIVE Final    Comment:        The GeneXpert MRSA Assay (FDA approved for NASAL specimens only), is one component of a comprehensive MRSA colonization surveillance program. It is not intended to diagnose MRSA infection nor to guide or monitor treatment for MRSA infections. Performed at [facility], [address] [place], [place] [address]     Coagulation Studies: No results for input(s): LABPROT, INR in the last 72 hours.  Urinalysis: No results for input(s): COLORURINE, LABSPEC, PHURINE, GLUCOSEU, HGBUR, BILIRUBINUR, KETONESUR, PROTEINUR, UROBILINOGEN, NITRITE, LEUKOCYTESUR in the last 72 hours.  Invalid input(s): APPERANCEUR    Imaging: Dg Chest 2 View  Result Date: 04/14/2019 CLINICAL DATA:  [name] is a 58 y.o. male, w hypertension, hyperlipidemia, DM2, CKD stage3, ANA positive, h/o R MCA CVA w chronic bilateral ICA occlusion, presents with c/o dehydration. Pt states that he had rash and was seen by Dr. Marcelino Scot at [place] [facility] and had labs and sent to ER for evaluation of ARFHx of TB EXAM: CHEST - 2 VIEW COMPARISON:  02/26/2018 FINDINGS: Cardiac silhouette is normal in size. No mediastinal or hilar masses. No evidence of adenopathy. Clear lungs.  No pleural effusion or pneumothorax. Right internal jugular dual-lumen tunneled central venous catheter has its tip projecting just above the caval atrial junction. This is new from the prior study. Skeletal structures are intact. IMPRESSION: 1. No acute cardiopulmonary disease. Electronically Signed   By: Lajean Manes M.D.   On: 04/14/2019 16:19     Medications:    . [START ON 04/16/2019] amLODipine  5 mg Oral Daily  . Chlorhexidine Gluconate Cloth  6 each Topical Q0600  . folic acid  1 mg Oral Daily  . heparin  5,000  Units Subcutaneous Q8H  . insulin aspart  0-5 Units Subcutaneous QHS  . insulin aspart  0-6 Units Subcutaneous TID WC  . insulin glargine  4 Units Subcutaneous Daily  . metoprolol tartrate  50 mg Oral BID  . pantoprazole  40 mg Oral Daily  . sertraline  100 mg Oral Daily   acetaminophen **OR** acetaminophen, ondansetron (ZOFRAN) IV  Assessment/ Plan:  58 y.o. black male with diabetes mellitus type II, hypertension who is admitted to [facility] and initiated on hemodialysis.   1.  ESRD initiated HD.  Seen and examined on hemodialysis treatment. Seated in chair. Tolerating treatment well.  Outpatient planning for TTS first shift at [place].  Patient to start tomorrow  2.  Anemia of chronic kidney disease.  - EPO    3.  Secondary hyperparathyroidism. With hypocalcemia and  hypophosphatemia - continue to monitor.   4.  Hypokalemia:   - 4 K bath  5. Diabetes mellitus type II with chronic kidney disease: hemoglobin A1c of 6.9%.   6. Hypertension:  - metoprolol   LOS: 6 Carlette Palmatier 12/2/20202:09 PM

## 2019-04-15 NOTE — Clinical Social Work Note (Signed)
CSW was informed by nephrology that patient has a chair time for 6am, Tu,Th,Sa at [facility] on [address].  CSW signing off, please reconsult if social work needs arise.  Jones Broom. Dawid Dupriest, MSW, LCSW [phone]  04/15/2019 11:56 AM

## 2019-04-15 NOTE — Progress Notes (Signed)
PROGRESS NOTE    [name]  [id number] DOB: [birth year] DOA: 04/08/2019 PCP: [name], MD Brief Narrative:  [name] y.o.male,w hypertension, hyperlipidemia, DM2, CKD stage3, ANA positive, h/o R MCA CVA w chronic bilateral ICA occlusion, presents with c/o dehydration. Pt denies nsaid use or recent iv dye exposure.  Pt states that he had rash and was seen by Dr. Marcelino Scot at [place] [facility] and had labs and sent to ER for evaluation of ARF.   HD 12/1, tolerated well.  Frustrated about continued hospital stay.    12/2: Pending outpatient HD facility acceptance and chair time.    Assessment & Plan:   Principal Problem:   ARF (acute renal failure) (HCC) Active Problems:   Acute renal failure superimposed on stage 4 chronic kidney disease (HCC)   Uncontrolled diabetes mellitus (HCC)   HTN (hypertension)   Acute renal failure (ARF) (HCC)   ESRD (end stage renal disease) ([place])  Acute on chronic CKD 3/4 renal failure: [employer]th[employer] Demadex BICARB TABS OFF Consultednephrology-dialysis cath placed HD x 4.  Cleared for discharge from nephrology standpoint Awaiting outpatient HD facility acceptance and chair time   Nonanion gap acidosis secondary to diarrhea Improving with dialysis This was consistent with volume loss and bicarb loss explaining patient's acute renal failure as above. Labs again in the morning Welch. difficile and GI pathogen panel  Hypokalemia Monitor cautious repletion given patient's acute renal failure MonitorBMP in the morning  Diabetes type 2 Continue sliding scale insulin Check blood glucose AC at bedtime  Diabetic neuropathy Gabapentin was stopped secondary renal insufficiency Hold for now, can consider restarting at later time and at reduced dose  Hypertension Continue Norvasc Stop demedex  GERD Continue home PPI  Anxiety  Patient at baseline no acute decompensation Continue SSRI at 100 mg p.o. daily for  Zoloft  DVT prophylaxis: SCD Code Status: FULL Family Communication: Discussed POC with patient Disposition Plan: Home pending outpatient HD chair  Consultants:   [place]- nephrology   Procedures: Vascath placement  Antimicrobials:   Subjective: Seen and examined Post HD Improved BP control Frustrated about continued hospital stay No new complaints  Objective: Vitals:   04/14/19 2028 04/14/19 2155 04/15/19 0552 04/15/19 0828  BP: (!) 169/93 (!) 151/99 (!) 174/95 (!) 174/92  Pulse: (!) 59 69 (!) 57 67  Resp: 20  20   Temp: 98.7 F (37.1 C)  98.4 F (36.9 C)   TempSrc: Oral  Oral   SpO2: 100%  100%   Weight:      Height:        Intake/Output Summary (Last 24 hours) at 04/15/2019 1116 Last data filed at 04/15/2019 0952 Gross per 24 hour  Intake 0 ml  Output 1000 ml  Net -1000 ml   Filed Weights   04/12/19 0502 04/13/19 0415 04/14/19 0455  Weight: 67.9 kg 66.6 kg 63.5 kg    Examination:  General exam: Appears calm and comfortable  Respiratory system: Clear to auscultation. Respiratory effort normal. Cardiovascular system: S1 & S2 heard, RRR. No JVD, murmurs, rubs, gallops or clicks. No pedal edema. Gastrointestinal system: Abdomen is nondistended, soft and nontender. No organomegaly or masses felt. Normal bowel sounds heard. Central nervous system: Alert and oriented. No focal neurological deficits. Extremities: Symmetric 5 x 5 power. Skin: Vascath right chest wall, site clean Psychiatry: Judgement and insight appear normal. Mood & affect appropriate.     Data Reviewed: I have personally reviewed following labs and imaging studies  CBC: Recent Labs  Lab 04/08/19  1657 04/09/19 0529 04/14/19 1015  WBC 7.2 6.2 5.3  HGB 9.9* 8.5* 7.2*  HCT 31.3* 25.2* 22.2*  MCV 94.0 89.4 91.0  PLT 314 281 [id number]   Basic Metabolic Panel: Recent Labs  Lab 04/11/19 0420  04/12/19 0427 04/13/19 0605 04/14/19 0416 04/14/19 1015 04/15/19 0338  NA 136  --  139 139  140  --  140  K 2.8*  --  3.0* 3.0* 3.0*  --  3.0*  CL 106  --  107 106 108  --  103  CO2 22  --  24 26 25   --  29  GLUCOSE 328*  --  67* 166* 88  --  89  BUN 38*  --  25* 12 14  --  8  CREATININE 4.61*  --  3.53* 2.44* 3.18*  --  2.51*  CALCIUM 6.9*  --  7.2* 7.6* 7.6*  --  7.6*  MG 1.6*  --  1.7 1.7 1.6*  --  1.5*  PHOS 2.9   < > 2.5 1.3* 1.3* 1.4* 1.1*   < > = values in this interval not displayed.   GFR: Estimated Creatinine Clearance: 28.5 mL/min (A) (by C-G formula based on SCr of 2.51 mg/dL (H)). Liver Function Tests: Recent Labs  Lab 04/09/19 0529  AST 9*  ALT 7  ALKPHOS 99  BILITOT 0.4  PROT 6.6  ALBUMIN 2.0*   No results for input(s): LIPASE, AMYLASE in the last 168 hours. No results for input(s): AMMONIA in the last 168 hours. Coagulation Profile: No results for input(s): INR, PROTIME in the last 168 hours. Cardiac Enzymes: Recent Labs  Lab 04/09/19 0529  CKTOTAL 37*   BNP (last 3 results) No results for input(s): PROBNP in the last 8760 hours. HbA1C: No results for input(s): HGBA1C in the last 72 hours. CBG: Recent Labs  Lab 04/14/19 0917 04/14/19 2037 04/15/19 0749 04/15/19 0824 04/15/19 0913  GLUCAP 147* 160* 55* 63* 145*   Lipid Profile: No results for input(s): CHOL, HDL, LDLCALC, TRIG, CHOLHDL, LDLDIRECT in the last 72 hours. Thyroid Function Tests: No results for input(s): TSH, T4TOTAL, FREET4, T3FREE, THYROIDAB in the last 72 hours. Anemia Panel: No results for input(s): VITAMINB12, FOLATE, FERRITIN, TIBC, IRON, RETICCTPCT in the last 72 hours. Sepsis Labs: No results for input(s): PROCALCITON, LATICACIDVEN in the last 168 hours.  Recent Results (from the past 240 hour(s))  SARS CORONAVIRUS 2 (TAT 6-24 HRS) Nasopharyngeal Nasopharyngeal Swab     Status: None   Collection Time: 04/08/19 10:49 PM   Specimen: Nasopharyngeal Swab  Result Value Ref Range Status   SARS Coronavirus 2 NEGATIVE NEGATIVE Final    Comment: (NOTE) SARS-CoV-2  target nucleic acids are NOT DETECTED. The SARS-CoV-2 RNA is generally detectable in upper and lower respiratory specimens during the acute phase of infection. Negative results do not preclude SARS-CoV-2 infection, do not rule out co-infections with other pathogens, and should not be used as the sole basis for treatment or other patient management decisions. Negative results must be combined with clinical observations, patient history, and epidemiological information. The expected result is Negative. Fact Sheet for Patients: SugarRoll.be Fact Sheet for Healthcare Providers: https://www.woods-mathews.com/ This test is not yet approved or cleared by the [place] FDA and  has been authorized for detection and/or diagnosis of SARS-CoV-2 by FDA under an Emergency Use Authorization (EUA). This EUA will remain  in effect (meaning this test can be used) for the duration of the COVID-19 declaration under Section 56 4(b)(1) of the  Act, 21 U.S.C. section 360bbb-3(b)(1), unless the authorization is terminated or revoked sooner. Performed at [facility], [place] [address] [place], [place] [address]   MRSA PCR Screening     Status: None   Collection Time: 04/11/19  2:45 AM   Specimen: Nasal Mucosa; Nasopharyngeal  Result Value Ref Range Status   MRSA by PCR NEGATIVE NEGATIVE Final    Comment:        The GeneXpert MRSA Assay (FDA approved for NASAL specimens only), is one component of a comprehensive MRSA colonization surveillance program. It is not intended to diagnose MRSA infection nor to guide or monitor treatment for MRSA infections. Performed at [facility], [address] [place], [place] [address]          Radiology Studies: Dg Chest 2 View  Result Date: 04/14/2019 CLINICAL DATA:  Jettson Blais is a 58 y.o. male, w hypertension, hyperlipidemia, DM2, CKD stage3, ANA positive, h/o R MCA CVA w chronic bilateral ICA  occlusion, presents with c/o dehydration. Pt states that he had rash and was seen by Dr. Marcelino Scot at [place] [facility] and had labs and sent to ER for evaluation of ARFHx of TB EXAM: CHEST - 2 VIEW COMPARISON:  02/26/2018 FINDINGS: Cardiac silhouette is normal in size. No mediastinal or hilar masses. No evidence of adenopathy. Clear lungs.  No pleural effusion or pneumothorax. Right internal jugular dual-lumen tunneled central venous catheter has its tip projecting just above the caval atrial junction. This is new from the prior study. Skeletal structures are intact. IMPRESSION: 1. No acute cardiopulmonary disease. Electronically Signed   By: Lajean Manes M.D.   On: 04/14/2019 16:19        Scheduled Meds: . [START ON 04/16/2019] amLODipine  5 mg Oral Daily  . amLODipine  5 mg Oral Daily  . Chlorhexidine Gluconate Cloth  6 each Topical Q0600  . folic acid  1 mg Oral Daily  . heparin  5,000 Units Subcutaneous Q8H  . insulin aspart  0-5 Units Subcutaneous QHS  . insulin aspart  0-6 Units Subcutaneous TID WC  . insulin glargine  4 Units Subcutaneous Daily  . metoprolol tartrate  50 mg Oral BID  . pantoprazole  40 mg Oral Daily  . sertraline  100 mg Oral Daily   Continuous Infusions:   LOS: 6 days    Time spent: 35 minutes    Sidney Ace, MD [facility] Pager [phone]  If 7PM-7AM, please contact night-coverage [email or web address] Password [email or web address] 04/15/2019, 11:16 AM

## 2019-05-27 ENCOUNTER — Encounter: Payer: Self-pay | Admitting: Emergency Medicine

## 2019-05-27 ENCOUNTER — Other Ambulatory Visit: Payer: Self-pay

## 2019-05-27 ENCOUNTER — Inpatient Hospital Stay
Admission: EM | Admit: 2019-05-27 | Discharge: 2019-05-29 | DRG: 640 | Disposition: A | Discharge status: Home Health | Payer: Medicare Other | Attending: Internal Medicine | Admitting: Internal Medicine

## 2019-05-27 ENCOUNTER — Emergency Department: Payer: Medicare Other

## 2019-05-27 DIAGNOSIS — Z20822 Contact with and (suspected) exposure to covid-19: Secondary | ICD-10-CM | POA: Diagnosis present

## 2019-05-27 DIAGNOSIS — S2231XA Fracture of one rib, right side, initial encounter for closed fracture: Secondary | ICD-10-CM | POA: Diagnosis present

## 2019-05-27 DIAGNOSIS — F17210 Nicotine dependence, cigarettes, uncomplicated: Secondary | ICD-10-CM | POA: Diagnosis present

## 2019-05-27 DIAGNOSIS — Z79899 Other long term (current) drug therapy: Secondary | ICD-10-CM

## 2019-05-27 DIAGNOSIS — R739 Hyperglycemia, unspecified: Secondary | ICD-10-CM

## 2019-05-27 DIAGNOSIS — W1830XA Fall on same level, unspecified, initial encounter: Secondary | ICD-10-CM | POA: Diagnosis present

## 2019-05-27 DIAGNOSIS — F329 Major depressive disorder, single episode, unspecified: Secondary | ICD-10-CM | POA: Diagnosis present

## 2019-05-27 DIAGNOSIS — I10 Essential (primary) hypertension: Secondary | ICD-10-CM | POA: Diagnosis present

## 2019-05-27 DIAGNOSIS — W19XXXA Unspecified fall, initial encounter: Secondary | ICD-10-CM

## 2019-05-27 DIAGNOSIS — Y92018 Other place in single-family (private) house as the place of occurrence of the external cause: Secondary | ICD-10-CM

## 2019-05-27 DIAGNOSIS — N186 End stage renal disease: Secondary | ICD-10-CM | POA: Diagnosis present

## 2019-05-27 DIAGNOSIS — Z888 Allergy status to other drugs, medicaments and biological substances status: Secondary | ICD-10-CM

## 2019-05-27 DIAGNOSIS — R52 Pain, unspecified: Secondary | ICD-10-CM | POA: Diagnosis not present

## 2019-05-27 DIAGNOSIS — S2232XA Fracture of one rib, left side, initial encounter for closed fracture: Secondary | ICD-10-CM

## 2019-05-27 DIAGNOSIS — E1165 Type 2 diabetes mellitus with hyperglycemia: Secondary | ICD-10-CM | POA: Diagnosis present

## 2019-05-27 DIAGNOSIS — E785 Hyperlipidemia, unspecified: Secondary | ICD-10-CM | POA: Diagnosis present

## 2019-05-27 DIAGNOSIS — E876 Hypokalemia: Principal | ICD-10-CM | POA: Diagnosis present

## 2019-05-27 DIAGNOSIS — D631 Anemia in chronic kidney disease: Secondary | ICD-10-CM | POA: Diagnosis present

## 2019-05-27 DIAGNOSIS — F32A Depression, unspecified: Secondary | ICD-10-CM

## 2019-05-27 DIAGNOSIS — Z794 Long term (current) use of insulin: Secondary | ICD-10-CM

## 2019-05-27 DIAGNOSIS — R296 Repeated falls: Secondary | ICD-10-CM | POA: Diagnosis present

## 2019-05-27 DIAGNOSIS — I120 Hypertensive chronic kidney disease with stage 5 chronic kidney disease or end stage renal disease: Secondary | ICD-10-CM | POA: Diagnosis present

## 2019-05-27 DIAGNOSIS — Z992 Dependence on renal dialysis: Secondary | ICD-10-CM

## 2019-05-27 DIAGNOSIS — S2239XA Fracture of one rib, unspecified side, initial encounter for closed fracture: Secondary | ICD-10-CM

## 2019-05-27 DIAGNOSIS — Z841 Family history of disorders of kidney and ureter: Secondary | ICD-10-CM

## 2019-05-27 DIAGNOSIS — Y92009 Unspecified place in unspecified non-institutional (private) residence as the place of occurrence of the external cause: Secondary | ICD-10-CM

## 2019-05-27 DIAGNOSIS — I151 Hypertension secondary to other renal disorders: Secondary | ICD-10-CM

## 2019-05-27 DIAGNOSIS — Z8249 Family history of ischemic heart disease and other diseases of the circulatory system: Secondary | ICD-10-CM

## 2019-05-27 DIAGNOSIS — R531 Weakness: Secondary | ICD-10-CM

## 2019-05-27 DIAGNOSIS — K219 Gastro-esophageal reflux disease without esophagitis: Secondary | ICD-10-CM | POA: Diagnosis present

## 2019-05-27 DIAGNOSIS — E1122 Type 2 diabetes mellitus with diabetic chronic kidney disease: Secondary | ICD-10-CM | POA: Diagnosis present

## 2019-05-27 DIAGNOSIS — F17290 Nicotine dependence, other tobacco product, uncomplicated: Secondary | ICD-10-CM | POA: Diagnosis present

## 2019-05-27 DIAGNOSIS — N2581 Secondary hyperparathyroidism of renal origin: Secondary | ICD-10-CM | POA: Diagnosis present

## 2019-05-27 DIAGNOSIS — N2889 Other specified disorders of kidney and ureter: Secondary | ICD-10-CM

## 2019-05-27 DIAGNOSIS — Z8673 Personal history of transient ischemic attack (TIA), and cerebral infarction without residual deficits: Secondary | ICD-10-CM

## 2019-05-27 LAB — BASIC METABOLIC PANEL
Anion gap: 8 (ref 5–15)
Anion gap: 10 (ref 5–15)
BUN: 11 mg/dL (ref 6–20)
BUN: 11 mg/dL (ref 6–20)
CO2: 31 mmol/L (ref 22–32)
CO2: 26 mmol/L (ref 22–32)
Calcium: 7.6 mg/dL — ABNORMAL LOW (ref 8.9–10.3)
Calcium: 7.3 mg/dL — ABNORMAL LOW (ref 8.9–10.3)
Chloride: 89 mmol/L — ABNORMAL LOW (ref 98–111)
Chloride: 93 mmol/L — ABNORMAL LOW (ref 98–111)
Creatinine, Ser: 3.18 mg/dL — ABNORMAL HIGH (ref 0.61–1.24)
Creatinine, Ser: 3.38 mg/dL — ABNORMAL HIGH (ref 0.61–1.24)
GFR calc Af Amer: 23 mL/min — ABNORMAL LOW (ref >60)
GFR calc Af Amer: 22 mL/min — ABNORMAL LOW (ref >60)
GFR calc non Af Amer: 20 mL/min — ABNORMAL LOW (ref >60)
GFR calc non Af Amer: 19 mL/min — ABNORMAL LOW (ref >60)
Glucose, Bld: 835 mg/dL (ref 70–99)
Glucose, Bld: 781 mg/dL (ref 70–99)
Potassium: 2.2 mmol/L — CL (ref 3.5–5.1)
Potassium: 2.3 mmol/L — CL (ref 3.5–5.1)
Sodium: 128 mmol/L — ABNORMAL LOW (ref 135–145)
Sodium: 129 mmol/L — ABNORMAL LOW (ref 135–145)

## 2019-05-27 LAB — CBC
HCT: 31.4 % — ABNORMAL LOW (ref 39.0–52.0)
Hemoglobin: 10 g/dL — ABNORMAL LOW (ref 13.0–17.0)
MCH: 28.7 pg (ref 26.0–34.0)
MCHC: 31.8 g/dL (ref 30.0–36.0)
MCV: 90.2 fL (ref 80.0–100.0)
Platelets: 197 10*3/uL (ref 150–400)
RBC: 3.48 MIL/uL — ABNORMAL LOW (ref 4.22–5.81)
RDW: 12.2 % (ref 11.5–15.5)
WBC: 7 10*3/uL (ref 4.0–10.5)
nRBC: 0 % (ref 0.0–0.2)

## 2019-05-27 LAB — URINALYSIS, COMPLETE (UACMP) WITH MICROSCOPIC
Bacteria, UA: NONE SEEN
Bilirubin Urine: NEGATIVE
Glucose, UA: >=500 mg/dL — AB
Ketones, ur: NEGATIVE mg/dL
Leukocytes,Ua: NEGATIVE
Nitrite: NEGATIVE
Protein, ur: >=300 mg/dL — AB
Specific Gravity, Urine: 1.017 (ref 1.005–1.030)
pH: 6 (ref 5.0–8.0)

## 2019-05-27 LAB — MAGNESIUM: Magnesium: 1.6 mg/dL — ABNORMAL LOW (ref 1.7–2.4)

## 2019-05-27 LAB — PHOSPHORUS: Phosphorus: 2.3 mg/dL — ABNORMAL LOW (ref 2.5–4.6)

## 2019-05-27 MED ORDER — SODIUM CHLORIDE 0.9 % IV BOLUS
500.0000 mL | Freq: Once | INTRAVENOUS | Status: AC
  Administered 2019-05-27: 500 mL via INTRAVENOUS

## 2019-05-27 MED ORDER — HEPARIN SODIUM (PORCINE) 5000 UNIT/ML IJ SOLN
5000.0000 [IU] | Freq: Three times a day (TID) | INTRAMUSCULAR | Status: DC
  Administered 2019-05-28 – 2019-05-29 (×5): 5000 [IU] via SUBCUTANEOUS
  Filled 2019-05-27 (×5): qty 1

## 2019-05-27 MED ORDER — SODIUM CHLORIDE 0.9% FLUSH: 3.0000 mL | Freq: Once | INTRAVENOUS | Status: DC

## 2019-05-27 MED ORDER — POTASSIUM CHLORIDE CRYS ER 20 MEQ PO TBCR
40.0000 meq | EXTENDED_RELEASE_TABLET | Freq: Once | ORAL | Status: AC
  Administered 2019-05-27: 40 meq via ORAL
  Filled 2019-05-27: qty 2

## 2019-05-27 MED ORDER — HYDROMORPHONE HCL 1 MG/ML IJ SOLN
0.5000 mg | Freq: Once | INTRAMUSCULAR | Status: AC
  Administered 2019-05-27: 0.5 mg via INTRAVENOUS
  Filled 2019-05-27: qty 1

## 2019-05-27 MED ORDER — POTASSIUM CHLORIDE 10 MEQ/100ML IV SOLN
10.0000 meq | INTRAVENOUS | Status: AC
  Administered 2019-05-27 – 2019-05-28 (×3): 10 meq via INTRAVENOUS
  Filled 2019-05-27 (×3): qty 100

## 2019-05-27 MED ORDER — ACETAMINOPHEN 325 MG PO TABS
650.0000 mg | ORAL_TABLET | Freq: Once | ORAL | Status: AC
  Administered 2019-05-27: 650 mg via ORAL
  Filled 2019-05-27: qty 2

## 2019-05-27 NOTE — ED Notes (Signed)
Patient transported to CT at this time. 

## 2019-05-27 NOTE — ED Triage Notes (Signed)
Pt in via EMS from home for weakness. Pt is a dialysis patient and has some abnormal labs. Pt is scheduled for dialysis tomorrow.

## 2019-05-27 NOTE — ED Notes (Signed)
Spoke to patient's sister Rise Paganini by phone to give update.

## 2019-05-27 NOTE — ED Notes (Signed)
Patient resting quietly in room with no apparent acute distress, appears weak and tired.  Assisted to use urinal, and expressing no other needs at this time.  Bed in lowest position, call light within reach.  Will continue to monitor.

## 2019-05-27 NOTE — ED Triage Notes (Signed)
Pt presents to ED via [facility] from home with c/o weakness, fatigue, and hypokalemia. Pt states is a [facility] dialysis patient. Pt noted to be unsteady on his feet. Pt also c/o R side pain from a fall.

## 2019-05-27 NOTE — ED Notes (Signed)
Date and time results received: 05/27/19 9:37 PM  Test: Potassium Critical Value: 2.3  Test:  Blood Glucose Critical Value:  781  Name of Provider Notified: Ellender Hose MD

## 2019-05-27 NOTE — ED Provider Notes (Signed)
[facility] Emergency Department Provider Note  ____________________________________________   First MD Initiated Contact with Patient 05/27/19 2013     (approximate)  I have reviewed the triage vital signs and the nursing notes.   HISTORY  Chief Complaint Abnormal labs and Weakness    HPI [name] is a 59 y.o. male  Here with generalized weakness  Went to HD yesterday, felt fine. Around 4 PM yesterday, he states he had a fall. He then began to have R sided chest wall and flank pain, down to his right hip. The pain is aching, severe, worse w/ movement and palpation. He also feels somewhat weak, has had difficulty getting around. Reports he lost his balance when falling, but then having difficulty walking. Admits to very poor PO intake.    He also reports R sided chest pain worse w/ inspiration. No SOB.    Past Medical History:  Diagnosis Date  . Chronic kidney disease   . Diabetes mellitus without complication ([place])   . ETOH abuse   . Hyperlipidemia   . Hypertension   . Stroke [facility])     Patient Active Problem List   Diagnosis Date Noted  . Hyperglycemia 05/28/2019  . Fall at home, initial encounter 05/28/2019  . Rib fracture 05/28/2019  . Depression 05/28/2019  . Weakness 05/27/2019  . ESRD (end stage renal disease) ([place])   . Acute renal failure (ARF) ([place]) 04/09/2019  . Polyp of ascending colon   . Diarrhea   . AKI (acute kidney injury) ([place]) 03/04/2019  . Acute kidney injury ([place]) 07/26/2018  . ARF (acute renal failure) ([place]) 07/25/2018  . CVA (cerebral vascular accident) ([place]) 03/17/2018  . UTI (urinary tract infection) 02/27/2018  . Hypoglycemia 01/15/2018  . Type 2 diabetes mellitus without complication, with long-term current use of insulin ([place]) 08/27/2017  . Protein-calorie malnutrition, severe 08/19/2017  . Pancreatitis, acute 08/16/2017  . DKA (diabetic ketoacidoses) ([place]) 08/16/2017  . HTN (hypertension) 08/16/2017  .  HLD (hyperlipidemia) 08/16/2017  . Carotid stenosis 08/02/2016  . GERD (gastroesophageal reflux disease) 08/02/2016  . History of esophagogastroduodenoscopy (EGD) 07/01/2016  . History of recent blood transfusion 07/01/2016  . Acute renal failure superimposed on stage 4 chronic kidney disease ([place]) 07/01/2016  . Hyponatremia 07/01/2016  . Uncontrolled diabetes mellitus ([place]) 07/01/2016  . Alcohol abuse 07/01/2016  . Monilial esophagitis ([place]) 07/01/2016  . Tobacco abuse 07/01/2016  . Confusion 06/29/2016  . Iron deficiency anemia 06/29/2016  . Coagulopathy ([place]) 06/29/2016    Past Surgical History:  Procedure Laterality Date  . COLONOSCOPY WITH PROPOFOL N/A 03/06/2019   Procedure: COLONOSCOPY WITH PROPOFOL;  Surgeon: Lucilla Lame, MD;  Location: [facility] ENDOSCOPY;  Service: Endoscopy;  Laterality: N/A;  . DIALYSIS/PERMA CATHETER INSERTION N/A 04/10/2019   Procedure: DIALYSIS/PERMA CATHETER INSERTION;  Surgeon: Serafina Mitchell, MD;  Location: [facility] CV LAB;  Service: Cardiovascular;  Laterality: N/A;  . ESOPHAGOGASTRODUODENOSCOPY (EGD) WITH PROPOFOL N/A 07/01/2016   Procedure: ESOPHAGOGASTRODUODENOSCOPY (EGD) WITH PROPOFOL;  Surgeon: San Jetty, MD;  Location: [facility] ENDOSCOPY;  Service: General;  Laterality: N/A;    Prior to Admission medications   Medication Sig Start Date End Date Taking? Authorizing Provider  amLODipine (NORVASC) 5 MG tablet Take 1 tablet (5 mg total) by mouth daily. 04/16/19  Yes Sreenath, Sudheer B, MD  folic acid (FOLVITE) 1 MG tablet Take 1 mg by mouth daily. 03/12/19  Yes [provider]  hydrocortisone 2.5 % cream Apply 1 application topically 2 (two) times daily as  needed (rash).  04/08/19  Yes [provider]  insulin glargine (LANTUS) 100 UNIT/ML injection Inject 0.15 mLs (15 Units total) into the skin daily. Patient taking differently: Inject 15 Units into the skin daily as needed (elevated blood sugar).  07/27/18  Yes Mody, Ulice Bold, MD   metoprolol tartrate (LOPRESSOR) 50 MG tablet Take 50 mg by mouth 2 (two) times daily. 02/16/19 02/16/20 Yes [provider]  pantoprazole (PROTONIX) 40 MG tablet Take 40 mg by mouth daily. 12/18/18  Yes [provider]  sertraline (ZOLOFT) 100 MG tablet Take 100 mg by mouth daily. 04/08/19  Yes [provider]    Allergies Ferrous gluconate  Family History  Problem Relation Age of Onset  . CAD Brother   . Dementia Mother   . Renal Disease Father     Social History Social History   Tobacco Use  . Smoking status: Current Every Day Smoker    Packs/day: 0.25    Years: 5.00    Pack years: 1.25    Types: Cigars  . Smokeless tobacco: Never Used  Substance Use Topics  . Alcohol use: Yes    Alcohol/week: 16.0 standard drinks    Types: 2 Shots of liquor, 14 Cans of beer per week  . Drug use: No    Review of Systems  Review of Systems  Constitutional: Positive for fatigue.  Respiratory: Positive for chest tightness.   Cardiovascular: Positive for chest pain.  Musculoskeletal: Positive for arthralgias and myalgias.  Neurological: Positive for weakness.  All other systems reviewed and are negative.    ____________________________________________  PHYSICAL EXAM:      VITAL SIGNS: ED Triage Vitals [05/27/19 1750]  Enc Vitals Group     BP (!) 160/93     Pulse Rate 76     Resp 18     Temp 98.3 F (36.8 C)     Temp Source Oral     SpO2 98 %     Weight 150 lb (68 kg)     Height 5\' 11"  (1.803 m)     Head Circumference      Peak Flow      Pain Score 10     Pain Loc      Pain Edu?      Excl. in [place]?      Physical Exam Vitals and nursing note reviewed.  Constitutional:      General: He is not in acute distress.    Appearance: He is well-developed.  HENT:     Head: Normocephalic and atraumatic.     Mouth/Throat:     Mouth: Mucous membranes are dry.  Eyes:     Conjunctiva/sclera: Conjunctivae normal.  Cardiovascular:     Rate and Rhythm:  Normal rate and regular rhythm.     Heart sounds: Normal heart sounds.  Pulmonary:     Effort: Pulmonary effort is normal. No respiratory distress.     Breath sounds: No wheezing.     Comments: Moderate R sided chest wall TTP over inferior lateral chest wall Abdominal:     General: Abdomen is flat. There is no distension.     Tenderness: There is abdominal tenderness (RUQ and R hemiabdomen, no guarding or rebound).  Musculoskeletal:     Cervical back: Neck supple.  Skin:    General: Skin is warm.     Capillary Refill: Capillary refill takes less than 2 seconds.     Findings: No rash.  Neurological:     Mental Status: He is  alert and oriented to person, place, and time.     Motor: No abnormal muscle tone.       ____________________________________________   LABS (all labs ordered are listed, but only abnormal results are displayed)  Labs Reviewed  BASIC METABOLIC PANEL - Abnormal; Notable for the following components:      Result Value   Sodium 128 (*)    Potassium 2.2 (*)    Chloride 89 (*)    Glucose, Bld 835 (*)    Creatinine, Ser 3.18 (*)    Calcium 7.6 (*)    GFR calc non Af Amer 20 (*)    GFR calc Af Amer 23 (*)    All other components within normal limits  CBC - Abnormal; Notable for the following components:   RBC 3.48 (*)    Hemoglobin 10.0 (*)    HCT 31.4 (*)    All other components within normal limits  URINALYSIS, COMPLETE (UACMP) WITH MICROSCOPIC - Abnormal; Notable for the following components:   Color, Urine STRAW (*)    APPearance CLEAR (*)    Glucose, UA >=500 (*)    Hgb urine dipstick SMALL (*)    Protein, ur >=300 (*)    All other components within normal limits  BASIC METABOLIC PANEL - Abnormal; Notable for the following components:   Sodium 129 (*)    Potassium 2.3 (*)    Chloride 93 (*)    Glucose, Bld 781 (*)    Creatinine, Ser 3.38 (*)    Calcium 7.3 (*)    GFR calc non Af Amer 19 (*)    GFR calc Af Amer 22 (*)    All other components  within normal limits  MAGNESIUM - Abnormal; Notable for the following components:   Magnesium 1.6 (*)    All other components within normal limits  PHOSPHORUS - Abnormal; Notable for the following components:   Phosphorus 2.3 (*)    All other components within normal limits  GLUCOSE, CAPILLARY - Abnormal; Notable for the following components:   Glucose-Capillary >600 (*)    All other components within normal limits  BASIC METABOLIC PANEL  CBC  BASIC METABOLIC PANEL  HEMOGLOBIN A1C  CBG MONITORING, ED    ____________________________________________  EKG:  Normal sinus rhythm, VR 78. PR 140, QRS 86, QTc 499. TWI noted diffusely, no ST elevations.  ________________________________________  RADIOLOGY All imaging, including plain films, CT scans, and ultrasounds, independently reviewed by me, and interpretations confirmed via formal radiology reads.  ED MD interpretation:   CXR: R 8-9th rib fx, no PTX CT Head/C-Spine: No acute trauma CT C/A/P: Posteiror R eighth rib fx, small R pleural effusion  Official radiology report(s): CT ABDOMEN PELVIS WO CONTRAST  Result Date: 05/27/2019 CLINICAL DATA:  Right-sided chest pain from a fall EXAM: CT CHEST WITHOUT CONTRAST TECHNIQUE: Multidetector CT imaging of the chest was performed following the standard protocol without IV contrast. COMPARISON:  None. FINDINGS: Cardiovascular: There is mild cardiomegaly. There is no pericardial thickening or effusion. Aortic atherosclerosis is seen. Coronary artery calcifications. Mediastinum/Nodes: A right-sided central venous catheter seen with the tip at the superior cavoatrial junction. Limited due to lack of intravenous contrast. No definite mediastinal adenopathy. The thyroid gland, trachea and esophagus demonstrate no significant findings. Lungs/Pleura: A small right pleural effusion is seen with patchy adjacent airspace opacity. No suspicious pulmonary nodules. There is no pleural effusion. Centrilobular  emphysematous changes seen at the lung apices. Upper abdomen: The visualized portion of the upper abdomen is unremarkable.  Musculoskeletal/Chest wall: There is no chest wall mass or suspicious osseous finding. No acute osseous abnormality Abdomen/pelvis: Hepatobiliary: Although limited due to the lack of intravenous contrast, normal in appearance without gross focal abnormality. No evidence of calcified gallstones or biliary ductal dilatation. Pancreas: Coarse calcifications are seen, likely from prior pancreatitis. Spleen: Normal in size. Although limited due to the lack of intravenous contrast, normal in appearance. Adrenals/Urinary Tract: Both adrenal glands appear normal. Bilateral perinephric fat stranding changes are seen. Stomach/Bowel: The stomach, small bowel, and colon are normal in appearance. No inflammatory changes or obstructive findings. Vascular/Lymphatic: There are no enlarged abdominal or pelvic lymph nodes. Scattered aortic atherosclerotic calcifications are seen without aneurysmal dilatation. Reproductive: The prostate is unremarkable. Other: No evidence of abdominal wall mass or hernia. Musculoskeletal: Nondisplaced posterior right eighth rib fracture seen. IMPRESSION: Nondisplaced posterior right eighth rib fracture. Small right pleural effusion with patchy adjacent atelectasis. Limited due to lack of intravenous contrast, however no definite acute intrathoracic, abdominal, or pelvic injury. Electronically Signed   By: Prudencio Pair M.D.   On: 05/27/2019 22:40   DG Chest 1 View  Result Date: 05/27/2019 CLINICAL DATA:  Right-sided chest pain, fall EXAM: CHEST  1 VIEW COMPARISON:  06/29/2016, 04/14/2019 FINDINGS: Right-sided central venous catheter tip over the cavoatrial region. No focal airspace disease, pleural effusion, or pneumothorax. Acute displaced right eighth and ninth lateral rib fractures IMPRESSION: 1. Negative for airspace disease or pneumothorax 2. Acute displaced right eighth  and ninth rib fractures Electronically Signed   By: Donavan Foil M.D.   On: 05/27/2019 21:41   CT Head Wo Contrast  Result Date: 05/27/2019 CLINICAL DATA:  Weakness, fatigue and fall with right-sided pain EXAM: CT HEAD WITHOUT CONTRAST CT CERVICAL SPINE WITHOUT CONTRAST TECHNIQUE: Multidetector CT imaging of the head and cervical spine was performed following the standard protocol without intravenous contrast. Multiplanar CT image reconstructions of the cervical spine were also generated. COMPARISON:  CT head 02/26/2018, MRI 12/31/2015 FINDINGS: CT HEAD FINDINGS Brain: Stable regions of encephalomalacia in the bilateral frontal lobes, insula and right caudate. Additional remote lacunar infarcts in the basal ganglia. No evidence of acute infarction, hemorrhage, hydrocephalus, extra-axial collection or mass lesion/mass effect. Symmetric prominence of the ventricles, cisterns and sulci compatible with parenchymal volume loss. Confluent areas of white matter hypoattenuation are most compatible with chronic microvascular angiopathy. Vascular: Atherosclerotic calcification of the carotid siphons and intradural vertebral arteries. No hyperdense vessel. Skull: No calvarial fracture or suspicious osseous lesion. No scalp swelling or hematoma. Sinuses/Orbits: Chronic opacification of the left frontal sinus. No air-fluid levels are seen. Remaining paranasal sinuses and mastoid air cells are predominantly clear. Included orbital structures are unremarkable. Other: Patient is edentulous. Mild temporomandibular arthrosis. CT CERVICAL SPINE FINDINGS Alignment: No acute traumatic listhesis. Craniocervical atlantoaxial alignment is maintained. Preservation of the normal cervical lordosis. Slight retrolisthesis of C5 on C6 by approximately 2 mm. Likely on a degenerative basis with spondylitic changes and facet degenerative features at this level. No abnormal facet widening, jumped or perched facets. Skull base and vertebrae: No  acute fracture. No primary bone lesion or focal pathologic process. Soft tissues and spinal canal: No pre or paravertebral fluid or swelling. No visible canal hematoma. Disc levels: Multilevel intervertebral disc height loss with spondylitic endplate changes. Additional mild uncinate spurring and minimal facet degenerative changes. No severe canal stenosis or foraminal narrowing. Upper chest: No acute abnormality in the upper chest or imaged lung apices. Other: Normal thyroid. Cervical carotid atherosclerosis. IMPRESSION: 1. No acute intracranial  abnormality. No scalp swelling or calvarial fracture. 2. Stable encephalomalacia in the bilateral frontal lobes, insula and right caudate. 3. Background of moderate chronic microvascular angiopathy changes and moderate parenchymal volume loss. 4. No acute fracture or subluxation of the cervical spine. 5. Multilevel degenerative disc disease and facet degenerative changes of the cervical spine. 6. Intracranial and cervical atherosclerosis. Electronically Signed   By: Lovena Le M.D.   On: 05/27/2019 21:35   CT Chest Wo Contrast  Result Date: 05/27/2019 CLINICAL DATA:  Right-sided chest pain from a fall EXAM: CT CHEST WITHOUT CONTRAST TECHNIQUE: Multidetector CT imaging of the chest was performed following the standard protocol without IV contrast. COMPARISON:  None. FINDINGS: Cardiovascular: There is mild cardiomegaly. There is no pericardial thickening or effusion. Aortic atherosclerosis is seen. Coronary artery calcifications. Mediastinum/Nodes: A right-sided central venous catheter seen with the tip at the superior cavoatrial junction. Limited due to lack of intravenous contrast. No definite mediastinal adenopathy. The thyroid gland, trachea and esophagus demonstrate no significant findings. Lungs/Pleura: A small right pleural effusion is seen with patchy adjacent airspace opacity. No suspicious pulmonary nodules. There is no pleural effusion. Centrilobular  emphysematous changes seen at the lung apices. Upper abdomen: The visualized portion of the upper abdomen is unremarkable. Musculoskeletal/Chest wall: There is no chest wall mass or suspicious osseous finding. No acute osseous abnormality Abdomen/pelvis: Hepatobiliary: Although limited due to the lack of intravenous contrast, normal in appearance without gross focal abnormality. No evidence of calcified gallstones or biliary ductal dilatation. Pancreas: Coarse calcifications are seen, likely from prior pancreatitis. Spleen: Normal in size. Although limited due to the lack of intravenous contrast, normal in appearance. Adrenals/Urinary Tract: Both adrenal glands appear normal. Bilateral perinephric fat stranding changes are seen. Stomach/Bowel: The stomach, small bowel, and colon are normal in appearance. No inflammatory changes or obstructive findings. Vascular/Lymphatic: There are no enlarged abdominal or pelvic lymph nodes. Scattered aortic atherosclerotic calcifications are seen without aneurysmal dilatation. Reproductive: The prostate is unremarkable. Other: No evidence of abdominal wall mass or hernia. Musculoskeletal: Nondisplaced posterior right eighth rib fracture seen. IMPRESSION: Nondisplaced posterior right eighth rib fracture. Small right pleural effusion with patchy adjacent atelectasis. Limited due to lack of intravenous contrast, however no definite acute intrathoracic, abdominal, or pelvic injury. Electronically Signed   By: Prudencio Pair M.D.   On: 05/27/2019 22:40   CT Cervical Spine Wo Contrast  Result Date: 05/27/2019 CLINICAL DATA:  Weakness, fatigue and fall with right-sided pain EXAM: CT HEAD WITHOUT CONTRAST CT CERVICAL SPINE WITHOUT CONTRAST TECHNIQUE: Multidetector CT imaging of the head and cervical spine was performed following the standard protocol without intravenous contrast. Multiplanar CT image reconstructions of the cervical spine were also generated. COMPARISON:  CT head  02/26/2018, MRI 12/31/2015 FINDINGS: CT HEAD FINDINGS Brain: Stable regions of encephalomalacia in the bilateral frontal lobes, insula and right caudate. Additional remote lacunar infarcts in the basal ganglia. No evidence of acute infarction, hemorrhage, hydrocephalus, extra-axial collection or mass lesion/mass effect. Symmetric prominence of the ventricles, cisterns and sulci compatible with parenchymal volume loss. Confluent areas of white matter hypoattenuation are most compatible with chronic microvascular angiopathy. Vascular: Atherosclerotic calcification of the carotid siphons and intradural vertebral arteries. No hyperdense vessel. Skull: No calvarial fracture or suspicious osseous lesion. No scalp swelling or hematoma. Sinuses/Orbits: Chronic opacification of the left frontal sinus. No air-fluid levels are seen. Remaining paranasal sinuses and mastoid air cells are predominantly clear. Included orbital structures are unremarkable. Other: Patient is edentulous. Mild temporomandibular arthrosis. CT CERVICAL SPINE FINDINGS  Alignment: No acute traumatic listhesis. Craniocervical atlantoaxial alignment is maintained. Preservation of the normal cervical lordosis. Slight retrolisthesis of C5 on C6 by approximately 2 mm. Likely on a degenerative basis with spondylitic changes and facet degenerative features at this level. No abnormal facet widening, jumped or perched facets. Skull base and vertebrae: No acute fracture. No primary bone lesion or focal pathologic process. Soft tissues and spinal canal: No pre or paravertebral fluid or swelling. No visible canal hematoma. Disc levels: Multilevel intervertebral disc height loss with spondylitic endplate changes. Additional mild uncinate spurring and minimal facet degenerative changes. No severe canal stenosis or foraminal narrowing. Upper chest: No acute abnormality in the upper chest or imaged lung apices. Other: Normal thyroid. Cervical carotid atherosclerosis.  IMPRESSION: 1. No acute intracranial abnormality. No scalp swelling or calvarial fracture. 2. Stable encephalomalacia in the bilateral frontal lobes, insula and right caudate. 3. Background of moderate chronic microvascular angiopathy changes and moderate parenchymal volume loss. 4. No acute fracture or subluxation of the cervical spine. 5. Multilevel degenerative disc disease and facet degenerative changes of the cervical spine. 6. Intracranial and cervical atherosclerosis. Electronically Signed   By: Lovena Le M.D.   On: 05/27/2019 21:35   DG HIP UNILAT WITH PELVIS 2-3 VIEWS RIGHT  Result Date: 05/27/2019 CLINICAL DATA:  Fall with hip pain EXAM: DG HIP (WITH OR WITHOUT PELVIS) 2-3V RIGHT COMPARISON:  CT 08/17/2017 FINDINGS: Linear object projects over the right iliac bone. The SI joints are non widened. The pubic symphysis and rami are intact. Both femoral heads project in joint. No fracture or malalignment. Vascular calcifications. IMPRESSION: 1. No acute osseous abnormality 2. Linear object overlying the right iliac bone, either external artifact or potentially foreign body. Electronically Signed   By: Donavan Foil M.D.   On: 05/27/2019 21:43    ____________________________________________  PROCEDURES   Procedure(s) performed (including Critical Care):  .Critical Care Performed by: Duffy Bruce, MD Authorized by: Duffy Bruce, MD   Critical care provider statement:    Critical care time (minutes):  35   Critical care time was exclusive of:  Separately billable procedures and treating other patients and teaching time   Critical care was necessary to treat or prevent imminent or life-threatening deterioration of the following conditions:  Cardiac failure, circulatory failure and metabolic crisis   Critical care was time spent personally by me on the following activities:  Evaluation of patient's response to treatment, examination of patient, obtaining history from patient or  surrogate, ordering and performing treatments and interventions, ordering and review of laboratory studies, ordering and review of radiographic studies, pulse oximetry, re-evaluation of patient's condition and review of old charts   I assumed direction of critical care for this patient from another provider in my specialty: no      ____________________________________________  INITIAL IMPRESSION / MDM / [place] / ED COURSE  As part of my medical decision making, I reviewed the following data within the [place] notes reviewed and incorporated, Old chart reviewed, Notes from prior ED visits, and [place] Controlled Substance Database       *[name] was evaluated in Emergency Department on 05/28/2019 for the symptoms described in the history of present illness. He was evaluated in the context of the global COVID-19 pandemic, which necessitated consideration that the patient might be at risk for infection with the SARS-CoV-2 virus that causes COVID-19. Institutional protocols and algorithms that pertain to the evaluation of patients at risk for COVID-19 are  in a state of rapid change based on information released by regulatory bodies including the CDC and federal and state organizations. These policies and algorithms were followed during the patient's care in the ED.  Some ED evaluations and interventions may be delayed as a result of limited staffing during the pandemic.*     Medical Decision Making:  59 yo M here with generalized weakness and R side pain. From trauma perspective, CXR showed dispalced 8-9th rib fx so CT obtained and confirms 8th rib no 9th rib fx, no PTX, no hemothorax. No abdominal trauma. Labs otherwise show marked hypokalemia, hyperglycemia, which I suspect is related to dietary habits, not taking insulin, as well as poor PO intake causing hypoK with dialysis. Will carefully replace K both PO and IV given degree of hypoK with prolonged QT,  treat pain, and admit. Normal CO2 and AG - no signs of DKA. WIll need insulin but holding at this time 2/2 hypokalemia.  ____________________________________________  FINAL CLINICAL IMPRESSION(S) / ED DIAGNOSES  Final diagnoses:  Closed fracture of one rib of right side, initial encounter  Hyperglycemia  Hypokalemia     MEDICATIONS GIVEN DURING THIS VISIT:  Medications  heparin injection 5,000 Units (5,000 Units Subcutaneous Given 05/28/19 0044)  insulin aspart (novoLOG) injection 0-9 Units (has no administration in time range)  insulin glargine (LANTUS) injection 10 Units (has no administration in time range)  amLODipine (NORVASC) tablet 5 mg (has no administration in time range)  metoprolol tartrate (LOPRESSOR) tablet 50 mg (has no administration in time range)  sertraline (ZOLOFT) tablet 100 mg (has no administration in time range)  pantoprazole (PROTONIX) EC tablet 40 mg (has no administration in time range)  folic acid (FOLVITE) tablet 1 mg (has no administration in time range)  acetaminophen (TYLENOL) tablet 650 mg (has no administration in time range)  sodium chloride 0.9 % bolus 500 mL (0 mLs Intravenous Stopped 05/27/19 2005)  potassium chloride SA (KLOR-CON) CR tablet 40 mEq (40 mEq Oral Given 05/27/19 1859)  potassium chloride 10 mEq in 100 mL IVPB (0 mEq Intravenous Stopped 05/28/19 0127)  HYDROmorphone (DILAUDID) injection 0.5 mg (0.5 mg Intravenous Given 05/27/19 2208)  acetaminophen (TYLENOL) tablet 650 mg (650 mg Oral Given 05/27/19 2212)     ED Discharge Orders    None       Note:  This document was prepared using Dragon voice recognition software and may include unintentional dictation errors.   Duffy Bruce, MD 05/28/19 [phone]

## 2019-05-27 NOTE — H&P (Addendum)
History and Physical    [name] [id number] DOB: March 16, 1961 DOA: 05/27/2019  PCP: [name], MD  Patient coming from: home  I have personally briefly reviewed patient's old medical records in [place]  Chief Complaint: weakness  HPI: [name] is a 59 y.o. 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 Patient states that he was in the living room today and staggered backwards and fell hitting his head. He was unable to get up for 30 minutes. He then made it into the kitchen and fell again and laid there for 30 minutes. Then he decided to lay in bed for awhile to see if he would feel better. Later got up again and fell in the living room. He then called his sister who helped him up and called EMS. He reports being dizziness during those episodes but no loss of consciousness. States he falls about once a week and has been falling more since he started to get dialysis. Normally uses a walker to ambulate and was using it during his falls.  He would like to get assistance in his home due to his frequent falls.  Also notes poor appetite for the past 6 months and only eats one meal a day although he tries to stay hydrated.    ED Course: He was afebrile, hypertensive up to 201/94 on room air.  WBC of 1, hemoglobin of 10.  Na of 128, K of 2.3, glucose of 781, creatinine of 3.38 which is around his baseline.  Negative UA.    Review of Systems:  Constitutional: No Weight Change, No Fever ENT/Mouth: No sore throat, No Rhinorrhea Eyes: No Vision Changes Cardiovascular: No Chest Pain, no SOB Respiratory: No Cough, No Sputum,  Gastrointestinal: No Nausea, No Vomiting, No Diarrhea, No Constipation, No Pain Genitourinary: no Urinary Incontinence Musculoskeletal: No Arthralgias, No Myalgias Skin: No Skin  Lesions, No Pruritus, Neuro: + Weakness, No Numbness,  No Loss of Consciousness Psych: No Anxiety/Panic, +Depression, +decrease appetite Heme/Lymph: No Bruising, No Bleeding  Past Medical History:  Diagnosis Date  . Chronic kidney disease   . Diabetes mellitus without complication ([place])   . ETOH abuse   . Hyperlipidemia   . Hypertension   . Stroke [facility])     Past Surgical History:  Procedure Laterality Date  . COLONOSCOPY WITH PROPOFOL N/A 03/06/2019   Procedure: COLONOSCOPY WITH PROPOFOL;  Surgeon: Lucilla Lame, MD;  Location: [facility] ENDOSCOPY;  Service: Endoscopy;  Laterality: N/A;  . DIALYSIS/PERMA CATHETER INSERTION N/A 04/10/2019   Procedure: DIALYSIS/PERMA CATHETER INSERTION;  Surgeon: Serafina Mitchell, MD;  Location: [facility] CV LAB;  Service: Cardiovascular;  Laterality: N/A;  . ESOPHAGOGASTRODUODENOSCOPY (EGD) WITH PROPOFOL N/A 07/01/2016   Procedure: ESOPHAGOGASTRODUODENOSCOPY (EGD) WITH PROPOFOL;  Surgeon: San Jetty, MD;  Location: [facility] ENDOSCOPY;  Service: General;  Laterality: N/A;     reports that he has been smoking cigars. He has a 1.25 pack-year smoking history. He has never used smokeless tobacco. He reports current alcohol use of about 16.0 standard drinks of alcohol per week. He reports that he does not use drugs.  Allergies  Allergen Reactions  . Ferrous Gluconate Nausea And Vomiting    Family History  Problem Relation Age of Onset  . CAD Brother   . Dementia Mother   . Renal Disease Father      Prior to  Admission medications   Medication Sig Start Date End Date Taking? Authorizing Provider  amLODipine (NORVASC) 5 MG tablet Take 1 tablet (5 mg total) by mouth daily. 04/16/19   Sidney Ace, MD  folic acid (FOLVITE) 1 MG tablet Take 1 mg by mouth daily. 03/12/19   [provider]  hydrocortisone 2.5 % cream Apply 1 application topically 2 (two) times daily. (apply to rash for 10 days) 04/08/19   [provider]  insulin  glargine (LANTUS) 100 UNIT/ML injection Inject 0.15 mLs (15 Units total) into the skin daily. Patient taking differently: Inject 8 Units into the skin daily.  07/27/18   Bettey Costa, MD  metoprolol tartrate (LOPRESSOR) 50 MG tablet Take 50 mg by mouth 2 (two) times daily. 02/16/19 02/16/20  [provider]  pantoprazole (PROTONIX) 40 MG tablet Take 40 mg by mouth daily. 12/18/18   [provider]  sertraline (ZOLOFT) 100 MG tablet Take 100 mg by mouth daily. 04/08/19   [provider]    Physical Exam: Vitals:   05/27/19 1750 05/27/19 2012 05/27/19 2030 05/27/19 2254  BP: (!) 160/93 (!) 166/102 (!) 201/94 (!) 172/95  Pulse: 76 68 65 69  Resp: 18 17 19 16   Temp: 98.3 F (36.8 C)     TempSrc: Oral     SpO2: 98% 100% 100% 100%  Weight: 68 kg     Height: 5\' 11"  (1.803 m)       Constitutional: NAD, calm, comfortable, non-toxic male laying flat in bed Vitals:   05/27/19 1750 05/27/19 2012 05/27/19 2030 05/27/19 2254  BP: (!) 160/93 (!) 166/102 (!) 201/94 (!) 172/95  Pulse: 76 68 65 69  Resp: 18 17 19 16   Temp: 98.3 F (36.8 C)     TempSrc: Oral     SpO2: 98% 100% 100% 100%  Weight: 68 kg     Height: 5\' 11"  (1.803 m)      Eyes: PERRL, lids and conjunctivae normal ENMT: Mucous membranes are moist. Neck: normal, supple Respiratory: clear to auscultation bilaterally, no wheezing, no crackles. Normal respiratory effort on room air. No accessory muscle use.  Cardiovascular: Regular rate and rhythm, no murmurs / rubs / gallops. No extremity edema. Abdomen: no tenderness, no masses palpated.  Bowel sounds positive.  Musculoskeletal: no clubbing / cyanosis. No joint deformity upper and lower extremities. Good ROM, no contractures. Normal muscle tone.  Skin: no rashes, lesions, ulcers. No induration Neurologic: CN 2-12 grossly intact.Slow speech. Sensation intact. Strength 5/5 in all 4.  Psychiatric: Normal judgment and insight. Alert and oriented x 3. Normal mood.      Labs on Admission: I have personally reviewed following labs and imaging studies  CBC: Recent Labs  Lab 05/27/19 1759  WBC 7.0  HGB 10.0*  HCT 31.4*  MCV 90.2  PLT [id number]   Basic Metabolic Panel: Recent Labs  Lab 05/27/19 1759 05/27/19 2104  NA 128* 129*  K 2.2* 2.3*  CL 89* 93*  CO2 31 26  GLUCOSE 835* 781*  BUN 11 11  CREATININE 3.18* 3.38*  CALCIUM 7.6* 7.3*  MG  --  1.6*  PHOS  --  2.3*   GFR: Estimated Creatinine Clearance: 22.6 mL/min (A) (by C-G formula based on SCr of 3.38 mg/dL (H)). Liver Function Tests: No results for input(s): AST, ALT, ALKPHOS, BILITOT, PROT, ALBUMIN in the last 168 hours. No results for input(s): LIPASE, AMYLASE in the last 168 hours. No results for input(s): AMMONIA in the last 168 hours. Coagulation  Profile: No results for input(s): INR, PROTIME in the last 168 hours. Cardiac Enzymes: No results for input(s): CKTOTAL, CKMB, CKMBINDEX, TROPONINI in the last 168 hours. BNP (last 3 results) No results for input(s): PROBNP in the last 8760 hours. HbA1C: No results for input(s): HGBA1C in the last 72 hours. CBG: No results for input(s): GLUCAP in the last 168 hours. Lipid Profile: No results for input(s): CHOL, HDL, LDLCALC, TRIG, CHOLHDL, LDLDIRECT in the last 72 hours. Thyroid Function Tests: No results for input(s): TSH, T4TOTAL, FREET4, T3FREE, THYROIDAB in the last 72 hours. Anemia Panel: No results for input(s): VITAMINB12, FOLATE, FERRITIN, TIBC, IRON, RETICCTPCT in the last 72 hours. Urine analysis:    Component Value Date/Time   COLORURINE STRAW (A) 05/27/2019 2047   APPEARANCEUR CLEAR (A) 05/27/2019 2047   APPEARANCEUR Clear 01/17/2014 1551   LABSPEC 1.017 05/27/2019 2047   LABSPEC 1.011 01/17/2014 1551   PHURINE 6.0 05/27/2019 2047   GLUCOSEU >=500 (A) 05/27/2019 2047   GLUCOSEU Negative 01/17/2014 1551   HGBUR SMALL (A) 05/27/2019 2047   BILIRUBINUR NEGATIVE 05/27/2019 2047   BILIRUBINUR Negative 01/17/2014  1551   KETONESUR NEGATIVE 05/27/2019 2047   PROTEINUR >=300 (A) 05/27/2019 2047   NITRITE NEGATIVE 05/27/2019 2047   LEUKOCYTESUR NEGATIVE 05/27/2019 2047   LEUKOCYTESUR 1+ 01/17/2014 1551    Radiological Exams on Admission: CT ABDOMEN PELVIS WO CONTRAST  Result Date: 05/27/2019 CLINICAL DATA:  Right-sided chest pain from a fall EXAM: CT CHEST WITHOUT CONTRAST TECHNIQUE: Multidetector CT imaging of the chest was performed following the standard protocol without IV contrast. COMPARISON:  None. FINDINGS: Cardiovascular: There is mild cardiomegaly. There is no pericardial thickening or effusion. Aortic atherosclerosis is seen. Coronary artery calcifications. Mediastinum/Nodes: A right-sided central venous catheter seen with the tip at the superior cavoatrial junction. Limited due to lack of intravenous contrast. No definite mediastinal adenopathy. The thyroid gland, trachea and esophagus demonstrate no significant findings. Lungs/Pleura: A small right pleural effusion is seen with patchy adjacent airspace opacity. No suspicious pulmonary nodules. There is no pleural effusion. Centrilobular emphysematous changes seen at the lung apices. Upper abdomen: The visualized portion of the upper abdomen is unremarkable. Musculoskeletal/Chest wall: There is no chest wall mass or suspicious osseous finding. No acute osseous abnormality Abdomen/pelvis: Hepatobiliary: Although limited due to the lack of intravenous contrast, normal in appearance without gross focal abnormality. No evidence of calcified gallstones or biliary ductal dilatation. Pancreas: Coarse calcifications are seen, likely from prior pancreatitis. Spleen: Normal in size. Although limited due to the lack of intravenous contrast, normal in appearance. Adrenals/Urinary Tract: Both adrenal glands appear normal. Bilateral perinephric fat stranding changes are seen. Stomach/Bowel: The stomach, small bowel, and colon are normal in appearance. No inflammatory  changes or obstructive findings. Vascular/Lymphatic: There are no enlarged abdominal or pelvic lymph nodes. Scattered aortic atherosclerotic calcifications are seen without aneurysmal dilatation. Reproductive: The prostate is unremarkable. Other: No evidence of abdominal wall mass or hernia. Musculoskeletal: Nondisplaced posterior right eighth rib fracture seen. IMPRESSION: Nondisplaced posterior right eighth rib fracture. Small right pleural effusion with patchy adjacent atelectasis. Limited due to lack of intravenous contrast, however no definite acute intrathoracic, abdominal, or pelvic injury. Electronically Signed   By: Prudencio Pair M.D.   On: 05/27/2019 22:40   DG Chest 1 View  Result Date: 05/27/2019 CLINICAL DATA:  Right-sided chest pain, fall EXAM: CHEST  1 VIEW COMPARISON:  06/29/2016, 04/14/2019 FINDINGS: Right-sided central venous catheter tip over the cavoatrial region. No focal airspace disease, pleural effusion, or pneumothorax.  Acute displaced right eighth and ninth lateral rib fractures IMPRESSION: 1. Negative for airspace disease or pneumothorax 2. Acute displaced right eighth and ninth rib fractures Electronically Signed   By: Donavan Foil M.D.   On: 05/27/2019 21:41   CT Head Wo Contrast  Result Date: 05/27/2019 CLINICAL DATA:  Weakness, fatigue and fall with right-sided pain EXAM: CT HEAD WITHOUT CONTRAST CT CERVICAL SPINE WITHOUT CONTRAST TECHNIQUE: Multidetector CT imaging of the head and cervical spine was performed following the standard protocol without intravenous contrast. Multiplanar CT image reconstructions of the cervical spine were also generated. COMPARISON:  CT head 02/26/2018, MRI 12/31/2015 FINDINGS: CT HEAD FINDINGS Brain: Stable regions of encephalomalacia in the bilateral frontal lobes, insula and right caudate. Additional remote lacunar infarcts in the basal ganglia. No evidence of acute infarction, hemorrhage, hydrocephalus, extra-axial collection or mass lesion/mass  effect. Symmetric prominence of the ventricles, cisterns and sulci compatible with parenchymal volume loss. Confluent areas of white matter hypoattenuation are most compatible with chronic microvascular angiopathy. Vascular: Atherosclerotic calcification of the carotid siphons and intradural vertebral arteries. No hyperdense vessel. Skull: No calvarial fracture or suspicious osseous lesion. No scalp swelling or hematoma. Sinuses/Orbits: Chronic opacification of the left frontal sinus. No air-fluid levels are seen. Remaining paranasal sinuses and mastoid air cells are predominantly clear. Included orbital structures are unremarkable. Other: Patient is edentulous. Mild temporomandibular arthrosis. CT CERVICAL SPINE FINDINGS Alignment: No acute traumatic listhesis. Craniocervical atlantoaxial alignment is maintained. Preservation of the normal cervical lordosis. Slight retrolisthesis of C5 on C6 by approximately 2 mm. Likely on a degenerative basis with spondylitic changes and facet degenerative features at this level. No abnormal facet widening, jumped or perched facets. Skull base and vertebrae: No acute fracture. No primary bone lesion or focal pathologic process. Soft tissues and spinal canal: No pre or paravertebral fluid or swelling. No visible canal hematoma. Disc levels: Multilevel intervertebral disc height loss with spondylitic endplate changes. Additional mild uncinate spurring and minimal facet degenerative changes. No severe canal stenosis or foraminal narrowing. Upper chest: No acute abnormality in the upper chest or imaged lung apices. Other: Normal thyroid. Cervical carotid atherosclerosis. IMPRESSION: 1. No acute intracranial abnormality. No scalp swelling or calvarial fracture. 2. Stable encephalomalacia in the bilateral frontal lobes, insula and right caudate. 3. Background of moderate chronic microvascular angiopathy changes and moderate parenchymal volume loss. 4. No acute fracture or subluxation  of the cervical spine. 5. Multilevel degenerative disc disease and facet degenerative changes of the cervical spine. 6. Intracranial and cervical atherosclerosis. Electronically Signed   By: Lovena Le M.D.   On: 05/27/2019 21:35   CT Chest Wo Contrast  Result Date: 05/27/2019 CLINICAL DATA:  Right-sided chest pain from a fall EXAM: CT CHEST WITHOUT CONTRAST TECHNIQUE: Multidetector CT imaging of the chest was performed following the standard protocol without IV contrast. COMPARISON:  None. FINDINGS: Cardiovascular: There is mild cardiomegaly. There is no pericardial thickening or effusion. Aortic atherosclerosis is seen. Coronary artery calcifications. Mediastinum/Nodes: A right-sided central venous catheter seen with the tip at the superior cavoatrial junction. Limited due to lack of intravenous contrast. No definite mediastinal adenopathy. The thyroid gland, trachea and esophagus demonstrate no significant findings. Lungs/Pleura: A small right pleural effusion is seen with patchy adjacent airspace opacity. No suspicious pulmonary nodules. There is no pleural effusion. Centrilobular emphysematous changes seen at the lung apices. Upper abdomen: The visualized portion of the upper abdomen is unremarkable. Musculoskeletal/Chest wall: There is no chest wall mass or suspicious osseous finding. No acute osseous  abnormality Abdomen/pelvis: Hepatobiliary: Although limited due to the lack of intravenous contrast, normal in appearance without gross focal abnormality. No evidence of calcified gallstones or biliary ductal dilatation. Pancreas: Coarse calcifications are seen, likely from prior pancreatitis. Spleen: Normal in size. Although limited due to the lack of intravenous contrast, normal in appearance. Adrenals/Urinary Tract: Both adrenal glands appear normal. Bilateral perinephric fat stranding changes are seen. Stomach/Bowel: The stomach, small bowel, and colon are normal in appearance. No inflammatory changes  or obstructive findings. Vascular/Lymphatic: There are no enlarged abdominal or pelvic lymph nodes. Scattered aortic atherosclerotic calcifications are seen without aneurysmal dilatation. Reproductive: The prostate is unremarkable. Other: No evidence of abdominal wall mass or hernia. Musculoskeletal: Nondisplaced posterior right eighth rib fracture seen. IMPRESSION: Nondisplaced posterior right eighth rib fracture. Small right pleural effusion with patchy adjacent atelectasis. Limited due to lack of intravenous contrast, however no definite acute intrathoracic, abdominal, or pelvic injury. Electronically Signed   By: Prudencio Pair M.D.   On: 05/27/2019 22:40   CT Cervical Spine Wo Contrast  Result Date: 05/27/2019 CLINICAL DATA:  Weakness, fatigue and fall with right-sided pain EXAM: CT HEAD WITHOUT CONTRAST CT CERVICAL SPINE WITHOUT CONTRAST TECHNIQUE: Multidetector CT imaging of the head and cervical spine was performed following the standard protocol without intravenous contrast. Multiplanar CT image reconstructions of the cervical spine were also generated. COMPARISON:  CT head 02/26/2018, MRI 12/31/2015 FINDINGS: CT HEAD FINDINGS Brain: Stable regions of encephalomalacia in the bilateral frontal lobes, insula and right caudate. Additional remote lacunar infarcts in the basal ganglia. No evidence of acute infarction, hemorrhage, hydrocephalus, extra-axial collection or mass lesion/mass effect. Symmetric prominence of the ventricles, cisterns and sulci compatible with parenchymal volume loss. Confluent areas of white matter hypoattenuation are most compatible with chronic microvascular angiopathy. Vascular: Atherosclerotic calcification of the carotid siphons and intradural vertebral arteries. No hyperdense vessel. Skull: No calvarial fracture or suspicious osseous lesion. No scalp swelling or hematoma. Sinuses/Orbits: Chronic opacification of the left frontal sinus. No air-fluid levels are seen. Remaining  paranasal sinuses and mastoid air cells are predominantly clear. Included orbital structures are unremarkable. Other: Patient is edentulous. Mild temporomandibular arthrosis. CT CERVICAL SPINE FINDINGS Alignment: No acute traumatic listhesis. Craniocervical atlantoaxial alignment is maintained. Preservation of the normal cervical lordosis. Slight retrolisthesis of C5 on C6 by approximately 2 mm. Likely on a degenerative basis with spondylitic changes and facet degenerative features at this level. No abnormal facet widening, jumped or perched facets. Skull base and vertebrae: No acute fracture. No primary bone lesion or focal pathologic process. Soft tissues and spinal canal: No pre or paravertebral fluid or swelling. No visible canal hematoma. Disc levels: Multilevel intervertebral disc height loss with spondylitic endplate changes. Additional mild uncinate spurring and minimal facet degenerative changes. No severe canal stenosis or foraminal narrowing. Upper chest: No acute abnormality in the upper chest or imaged lung apices. Other: Normal thyroid. Cervical carotid atherosclerosis. IMPRESSION: 1. No acute intracranial abnormality. No scalp swelling or calvarial fracture. 2. Stable encephalomalacia in the bilateral frontal lobes, insula and right caudate. 3. Background of moderate chronic microvascular angiopathy changes and moderate parenchymal volume loss. 4. No acute fracture or subluxation of the cervical spine. 5. Multilevel degenerative disc disease and facet degenerative changes of the cervical spine. 6. Intracranial and cervical atherosclerosis. Electronically Signed   By: Lovena Le M.D.   On: 05/27/2019 21:35   DG HIP UNILAT WITH PELVIS 2-3 VIEWS RIGHT  Result Date: 05/27/2019 CLINICAL DATA:  Fall with hip pain EXAM: DG HIP (  WITH OR WITHOUT PELVIS) 2-3V RIGHT COMPARISON:  CT 08/17/2017 FINDINGS: Linear object projects over the right iliac bone. The SI joints are non widened. The pubic symphysis and  rami are intact. Both femoral heads project in joint. No fracture or malalignment. Vascular calcifications. IMPRESSION: 1. No acute osseous abnormality 2. Linear object overlying the right iliac bone, either external artifact or potentially foreign body. Electronically Signed   By: Donavan Foil M.D.   On: 05/27/2019 21:43    EKG: Independently reviewed.   Assessment/Plan  Hypokalemia Repleted 83meq IV x 3 and PO 41meq Follow repeat BMP   Hyperglycemia in the setting of insulin dependent Type 2 diabetes Will give 10 units Lantus once and sliding scale  Will add on daily Lantus pending repeat BMP  Pseudonatremia Corrected to 140 due to hyperglycemia  Posterior right 8th rib fracture s/p fall PRN pain management   Recurrent falls Check orthostatics  PT/OT Consult social work for home health   ESRD Foye Haggart/Thur/Sat Need to consult nephrology in the morning for dialysis   Hypertension Continue amlodipine and metoprolol   GERD Continue PPI  Depression  Pt endorse multiple family members passing within the past few months. Denies suicidal ideations Continue sertraline  DVT prophylaxis: Heparin sq Code Status: Full Family Communication: Plan discussed with patient at bedside  disposition Plan: Home with observation Consults called:  Admission [place] T Zaydrian Batta DO [facility]   If 7PM-7AM, please contact night-coverage [email or web address] Password [facility]  05/27/2019, 11:56 PM

## 2019-05-27 NOTE — ED Notes (Signed)
Patient is resting quietly in room with no apparent acute distress.  Assisted to use urinal, no other needs expressed at this time.  Bed is in lowest position, call light within reach, lights dimmed per pt request.  Will continue to monitor.

## 2019-05-27 NOTE — ED Notes (Addendum)
Pt transported to CT ?

## 2019-05-28 DIAGNOSIS — N186 End stage renal disease: Secondary | ICD-10-CM | POA: Diagnosis present

## 2019-05-28 DIAGNOSIS — Z79899 Other long term (current) drug therapy: Secondary | ICD-10-CM | POA: Diagnosis not present

## 2019-05-28 DIAGNOSIS — Z888 Allergy status to other drugs, medicaments and biological substances status: Secondary | ICD-10-CM | POA: Diagnosis not present

## 2019-05-28 DIAGNOSIS — I120 Hypertensive chronic kidney disease with stage 5 chronic kidney disease or end stage renal disease: Secondary | ICD-10-CM | POA: Diagnosis present

## 2019-05-28 DIAGNOSIS — F32A Depression, unspecified: Secondary | ICD-10-CM

## 2019-05-28 DIAGNOSIS — R531 Weakness: Secondary | ICD-10-CM | POA: Diagnosis not present

## 2019-05-28 DIAGNOSIS — Y92018 Other place in single-family (private) house as the place of occurrence of the external cause: Secondary | ICD-10-CM | POA: Diagnosis not present

## 2019-05-28 DIAGNOSIS — Z992 Dependence on renal dialysis: Secondary | ICD-10-CM | POA: Diagnosis not present

## 2019-05-28 DIAGNOSIS — W1830XA Fall on same level, unspecified, initial encounter: Secondary | ICD-10-CM | POA: Diagnosis present

## 2019-05-28 DIAGNOSIS — Z8673 Personal history of transient ischemic attack (TIA), and cerebral infarction without residual deficits: Secondary | ICD-10-CM | POA: Diagnosis not present

## 2019-05-28 DIAGNOSIS — E876 Hypokalemia: Secondary | ICD-10-CM | POA: Diagnosis present

## 2019-05-28 DIAGNOSIS — Z794 Long term (current) use of insulin: Secondary | ICD-10-CM | POA: Diagnosis not present

## 2019-05-28 DIAGNOSIS — F329 Major depressive disorder, single episode, unspecified: Secondary | ICD-10-CM

## 2019-05-28 DIAGNOSIS — S2231XA Fracture of one rib, right side, initial encounter for closed fracture: Secondary | ICD-10-CM | POA: Diagnosis present

## 2019-05-28 DIAGNOSIS — Z20822 Contact with and (suspected) exposure to covid-19: Secondary | ICD-10-CM | POA: Diagnosis present

## 2019-05-28 DIAGNOSIS — E1165 Type 2 diabetes mellitus with hyperglycemia: Secondary | ICD-10-CM | POA: Diagnosis present

## 2019-05-28 DIAGNOSIS — R296 Repeated falls: Secondary | ICD-10-CM | POA: Diagnosis present

## 2019-05-28 DIAGNOSIS — E1122 Type 2 diabetes mellitus with diabetic chronic kidney disease: Secondary | ICD-10-CM | POA: Diagnosis present

## 2019-05-28 DIAGNOSIS — D631 Anemia in chronic kidney disease: Secondary | ICD-10-CM | POA: Diagnosis present

## 2019-05-28 DIAGNOSIS — W19XXXA Unspecified fall, initial encounter: Secondary | ICD-10-CM

## 2019-05-28 DIAGNOSIS — S2239XA Fracture of one rib, unspecified side, initial encounter for closed fracture: Secondary | ICD-10-CM

## 2019-05-28 DIAGNOSIS — Z841 Family history of disorders of kidney and ureter: Secondary | ICD-10-CM | POA: Diagnosis not present

## 2019-05-28 DIAGNOSIS — R739 Hyperglycemia, unspecified: Secondary | ICD-10-CM

## 2019-05-28 DIAGNOSIS — K219 Gastro-esophageal reflux disease without esophagitis: Secondary | ICD-10-CM | POA: Diagnosis present

## 2019-05-28 DIAGNOSIS — N2581 Secondary hyperparathyroidism of renal origin: Secondary | ICD-10-CM | POA: Diagnosis present

## 2019-05-28 DIAGNOSIS — Y92009 Unspecified place in unspecified non-institutional (private) residence as the place of occurrence of the external cause: Secondary | ICD-10-CM

## 2019-05-28 DIAGNOSIS — E785 Hyperlipidemia, unspecified: Secondary | ICD-10-CM | POA: Diagnosis present

## 2019-05-28 DIAGNOSIS — F17210 Nicotine dependence, cigarettes, uncomplicated: Secondary | ICD-10-CM | POA: Diagnosis present

## 2019-05-28 DIAGNOSIS — Z8249 Family history of ischemic heart disease and other diseases of the circulatory system: Secondary | ICD-10-CM | POA: Diagnosis not present

## 2019-05-28 DIAGNOSIS — F17290 Nicotine dependence, other tobacco product, uncomplicated: Secondary | ICD-10-CM | POA: Diagnosis present

## 2019-05-28 LAB — BASIC METABOLIC PANEL
Anion gap: 6 (ref 5–15)
Anion gap: 8 (ref 5–15)
Anion gap: 10 (ref 5–15)
Anion gap: 8 (ref 5–15)
Anion gap: 9 (ref 5–15)
BUN: 11 mg/dL (ref 6–20)
BUN: 11 mg/dL (ref 6–20)
BUN: 12 mg/dL (ref 6–20)
BUN: 6 mg/dL (ref 6–20)
BUN: 6 mg/dL (ref 6–20)
CO2: 28 mmol/L (ref 22–32)
CO2: 29 mmol/L (ref 22–32)
CO2: 28 mmol/L (ref 22–32)
CO2: 29 mmol/L (ref 22–32)
CO2: 26 mmol/L (ref 22–32)
Calcium: 7.2 mg/dL — ABNORMAL LOW (ref 8.9–10.3)
Calcium: 7.4 mg/dL — ABNORMAL LOW (ref 8.9–10.3)
Calcium: 8 mg/dL — ABNORMAL LOW (ref 8.9–10.3)
Calcium: 7.9 mg/dL — ABNORMAL LOW (ref 8.9–10.3)
Calcium: 7.8 mg/dL — ABNORMAL LOW (ref 8.9–10.3)
Chloride: 96 mmol/L — ABNORMAL LOW (ref 98–111)
Chloride: 94 mmol/L — ABNORMAL LOW (ref 98–111)
Chloride: 98 mmol/L (ref 98–111)
Chloride: 99 mmol/L (ref 98–111)
Chloride: 99 mmol/L (ref 98–111)
Creatinine, Ser: 3.33 mg/dL — ABNORMAL HIGH (ref 0.61–1.24)
Creatinine, Ser: 3.28 mg/dL — ABNORMAL HIGH (ref 0.61–1.24)
Creatinine, Ser: 3.39 mg/dL — ABNORMAL HIGH (ref 0.61–1.24)
Creatinine, Ser: 1.79 mg/dL — ABNORMAL HIGH (ref 0.61–1.24)
Creatinine, Ser: 1.97 mg/dL — ABNORMAL HIGH (ref 0.61–1.24)
GFR calc Af Amer: 22 mL/min — ABNORMAL LOW (ref >60)
GFR calc Af Amer: 23 mL/min — ABNORMAL LOW (ref >60)
GFR calc Af Amer: 22 mL/min — ABNORMAL LOW (ref >60)
GFR calc Af Amer: 47 mL/min — ABNORMAL LOW (ref >60)
GFR calc Af Amer: 42 mL/min — ABNORMAL LOW (ref >60)
GFR calc non Af Amer: 19 mL/min — ABNORMAL LOW (ref >60)
GFR calc non Af Amer: 20 mL/min — ABNORMAL LOW (ref >60)
GFR calc non Af Amer: 19 mL/min — ABNORMAL LOW (ref >60)
GFR calc non Af Amer: 41 mL/min — ABNORMAL LOW (ref >60)
GFR calc non Af Amer: 36 mL/min — ABNORMAL LOW (ref >60)
Glucose, Bld: 697 mg/dL (ref 70–99)
Glucose, Bld: 671 mg/dL (ref 70–99)
Glucose, Bld: 271 mg/dL — ABNORMAL HIGH (ref 70–99)
Glucose, Bld: 217 mg/dL — ABNORMAL HIGH (ref 70–99)
Glucose, Bld: 264 mg/dL — ABNORMAL HIGH (ref 70–99)
Potassium: 2.6 mmol/L — CL (ref 3.5–5.1)
Potassium: 2.5 mmol/L — CL (ref 3.5–5.1)
Potassium: 2.9 mmol/L — ABNORMAL LOW (ref 3.5–5.1)
Potassium: 3.5 mmol/L (ref 3.5–5.1)
Potassium: 3.6 mmol/L (ref 3.5–5.1)
Sodium: 130 mmol/L — ABNORMAL LOW (ref 135–145)
Sodium: 131 mmol/L — ABNORMAL LOW (ref 135–145)
Sodium: 136 mmol/L (ref 135–145)
Sodium: 136 mmol/L (ref 135–145)
Sodium: 134 mmol/L — ABNORMAL LOW (ref 135–145)

## 2019-05-28 LAB — CBC
HCT: 27.4 % — ABNORMAL LOW (ref 39.0–52.0)
Hemoglobin: 9 g/dL — ABNORMAL LOW (ref 13.0–17.0)
MCH: 29.1 pg (ref 26.0–34.0)
MCHC: 32.8 g/dL (ref 30.0–36.0)
MCV: 88.7 fL (ref 80.0–100.0)
Platelets: 176 10*3/uL (ref 150–400)
RBC: 3.09 MIL/uL — ABNORMAL LOW (ref 4.22–5.81)
RDW: 12 % (ref 11.5–15.5)
WBC: 5.8 10*3/uL (ref 4.0–10.5)
nRBC: 0 % (ref 0.0–0.2)

## 2019-05-28 LAB — HEMOGLOBIN A1C
Hgb A1c MFr Bld: 12.3 % — ABNORMAL HIGH (ref 4.8–5.6)
Mean Plasma Glucose: 306.31 mg/dL

## 2019-05-28 LAB — GLUCOSE, CAPILLARY
Glucose-Capillary: 153 mg/dL — ABNORMAL HIGH (ref 70–99)
Glucose-Capillary: 241 mg/dL — ABNORMAL HIGH (ref 70–99)
Glucose-Capillary: 417 mg/dL — ABNORMAL HIGH (ref 70–99)
Glucose-Capillary: 580 mg/dL (ref 70–99)
Glucose-Capillary: >600 mg/dL (ref 70–99)

## 2019-05-28 LAB — POC SARS CORONAVIRUS 2 AG: SARS Coronavirus 2 Ag: NEGATIVE

## 2019-05-28 LAB — SARS CORONAVIRUS 2 (TAT 6-24 HRS): SARS Coronavirus 2: NEGATIVE

## 2019-05-28 MED ORDER — CHLORHEXIDINE GLUCONATE CLOTH 2 % EX PADS
6.0000 | MEDICATED_PAD | Freq: Every day | CUTANEOUS | Status: DC
  Filled 2019-05-28 (×2): qty 6

## 2019-05-28 MED ORDER — POTASSIUM CHLORIDE CRYS ER 20 MEQ PO TBCR
40.0000 meq | EXTENDED_RELEASE_TABLET | Freq: Once | ORAL | Status: AC
  Administered 2019-05-28: 40 meq via ORAL
  Filled 2019-05-28: qty 2

## 2019-05-28 MED ORDER — INSULIN GLARGINE 100 UNIT/ML ~~LOC~~ SOLN
10.0000 [IU] | Freq: Once | SUBCUTANEOUS | Status: DC
  Filled 2019-05-28: qty 0.1

## 2019-05-28 MED ORDER — INSULIN ASPART 100 UNIT/ML ~~LOC~~ SOLN: 0.0000 [IU] | Freq: Three times a day (TID) | SUBCUTANEOUS | Status: DC

## 2019-05-28 MED ORDER — EPOETIN ALFA 10000 UNIT/ML IJ SOLN: 10000.0000 [IU] | INTRAMUSCULAR | Status: DC

## 2019-05-28 MED ORDER — MAGNESIUM SULFATE 2 GM/50ML IV SOLN
2.0000 g | Freq: Once | INTRAVENOUS | Status: AC
  Administered 2019-05-28: 2 g via INTRAVENOUS
  Filled 2019-05-28: qty 50

## 2019-05-28 MED ORDER — INSULIN REGULAR(HUMAN) IN NACL 100-0.9 UT/100ML-% IV SOLN
INTRAVENOUS | Status: DC
  Filled 2019-05-28: qty 100

## 2019-05-28 MED ORDER — INSULIN ASPART 100 UNIT/ML ~~LOC~~ SOLN
0.0000 [IU] | Freq: Three times a day (TID) | SUBCUTANEOUS | Status: DC
  Administered 2019-05-29: 11 [IU] via SUBCUTANEOUS
  Administered 2019-05-29: 5 [IU] via SUBCUTANEOUS
  Filled 2019-05-28 (×2): qty 1

## 2019-05-28 MED ORDER — INSULIN REGULAR(HUMAN) IN NACL 100-0.9 UT/100ML-% IV SOLN: INTRAVENOUS | Status: DC

## 2019-05-28 MED ORDER — POTASSIUM CHLORIDE 10 MEQ/100ML IV SOLN
10.0000 meq | INTRAVENOUS | Status: DC
  Administered 2019-05-28: 10 meq via INTRAVENOUS
  Filled 2019-05-28 (×4): qty 100

## 2019-05-28 MED ORDER — INSULIN GLARGINE 100 UNIT/ML ~~LOC~~ SOLN
15.0000 [IU] | Freq: Every day | SUBCUTANEOUS | Status: DC
  Administered 2019-05-29: 15 [IU] via SUBCUTANEOUS
  Filled 2019-05-28 (×3): qty 0.15

## 2019-05-28 MED ORDER — SERTRALINE HCL 50 MG PO TABS
100.0000 mg | ORAL_TABLET | Freq: Every day | ORAL | Status: DC
  Administered 2019-05-28 – 2019-05-29 (×2): 100 mg via ORAL
  Filled 2019-05-28 (×2): qty 2

## 2019-05-28 MED ORDER — METOPROLOL TARTRATE 50 MG PO TABS
50.0000 mg | ORAL_TABLET | Freq: Two times a day (BID) | ORAL | Status: DC
  Administered 2019-05-28 – 2019-05-29 (×3): 50 mg via ORAL
  Filled 2019-05-28 (×4): qty 1

## 2019-05-28 MED ORDER — MAGNESIUM OXIDE 400 (241.3 MG) MG PO TABS
400.0000 mg | ORAL_TABLET | ORAL | Status: DC
  Administered 2019-05-28: 400 mg via ORAL
  Filled 2019-05-28: qty 1

## 2019-05-28 MED ORDER — DEXTROSE 50 % IV SOLN: 0.0000 mL | INTRAVENOUS | Status: DC | PRN

## 2019-05-28 MED ORDER — HYDRALAZINE HCL 20 MG/ML IJ SOLN
10.0000 mg | Freq: Once | INTRAMUSCULAR | Status: AC
  Administered 2019-05-28: 10 mg via INTRAVENOUS
  Filled 2019-05-28: qty 1

## 2019-05-28 MED ORDER — POTASSIUM CHLORIDE 10 MEQ/100ML IV SOLN
10.0000 meq | INTRAVENOUS | Status: AC
  Administered 2019-05-28 (×3): 10 meq via INTRAVENOUS
  Filled 2019-05-28 (×3): qty 100

## 2019-05-28 MED ORDER — FOLIC ACID 1 MG PO TABS
1.0000 mg | ORAL_TABLET | Freq: Every day | ORAL | Status: DC
  Administered 2019-05-28 – 2019-05-29 (×2): 1 mg via ORAL
  Filled 2019-05-28 (×2): qty 1

## 2019-05-28 MED ORDER — ACETAMINOPHEN 325 MG PO TABS
650.0000 mg | ORAL_TABLET | Freq: Four times a day (QID) | ORAL | Status: DC | PRN
  Administered 2019-05-28: 650 mg via ORAL
  Filled 2019-05-28: qty 2

## 2019-05-28 MED ORDER — INSULIN ASPART 100 UNIT/ML ~~LOC~~ SOLN
20.0000 [IU] | Freq: Once | SUBCUTANEOUS | Status: AC
  Administered 2019-05-28: 20 [IU] via SUBCUTANEOUS
  Filled 2019-05-28: qty 1

## 2019-05-28 MED ORDER — POTASSIUM CHLORIDE 2 MEQ/ML IV SOLN
INTRAVENOUS | Status: DC
  Filled 2019-05-28 (×2): qty 1000

## 2019-05-28 MED ORDER — SODIUM CHLORIDE 0.9 % IV SOLN: INTRAVENOUS | Status: DC

## 2019-05-28 MED ORDER — PANTOPRAZOLE SODIUM 40 MG PO TBEC
40.0000 mg | DELAYED_RELEASE_TABLET | Freq: Every day | ORAL | Status: DC
  Administered 2019-05-28 – 2019-05-29 (×2): 40 mg via ORAL
  Filled 2019-05-28 (×2): qty 1

## 2019-05-28 MED ORDER — INSULIN ASPART 100 UNIT/ML ~~LOC~~ SOLN
0.0000 [IU] | Freq: Every day | SUBCUTANEOUS | Status: DC
  Administered 2019-05-28: 2 [IU] via SUBCUTANEOUS
  Filled 2019-05-28: qty 1

## 2019-05-28 MED ORDER — AMLODIPINE BESYLATE 5 MG PO TABS
5.0000 mg | ORAL_TABLET | Freq: Every day | ORAL | Status: DC
  Administered 2019-05-28 – 2019-05-29 (×2): 5 mg via ORAL
  Filled 2019-05-28 (×2): qty 1

## 2019-05-28 NOTE — Progress Notes (Addendum)
Inpatient Diabetes Program Recommendations  AACE/ADA: New Consensus Statement on Inpatient Glycemic Control (2015)  Target Ranges:  Prepandial:   less than 140 mg/dL      Peak postprandial:   less than 180 mg/dL (1-2 hours)      Critically ill patients:  140 - 180 mg/dL   Lab Results  Component Value Date   GLUCAP 417 (H) 05/28/2019   HGBA1C 6.9 (H) 03/04/2019    Review of Glycemic Control Results for [name], [name] (MRN [id number]) as of 05/28/2019 08:29  Ref. Range 05/28/2019 00:43 05/28/2019 05:19 05/28/2019 08:12  Glucose-Capillary Latest Ref Range: 70 - 99 mg/dL >600 (HH) 580 (HH) 417 (H)    Results for [name], [name] (MRN [id number]) as of 05/28/2019 08:29  Ref. Range 05/27/2019 21:04 05/28/2019 02:20 05/28/2019 03:00  Potassium Latest Ref Range: 3.5 - 5.1 mmol/L 2.3 (LL) 2.6 (LL) 2.5 (LL)   Diabetes history: DM2 Outpatient Diabetes medications: Lantus 10 units daily Current orders for Inpatient glycemic control: IV Insulin (not started yet)  Inpatient Diabetes Program Recommendations:     -Hold IV insulin -Replace K+  -Start IV insulin when K+ >3  Addendum- Spoke with patient while in HD.  He states he isn't sure why his CBG's were elevated.  He says he takes Metformin daily (can't remember dose) and Regular insulin 8 units if CBG's >200.  Sees Dr. Ginette Pitman.  Note from office visit 11/20 reads Lantus 8 units daily and no mention of Metformin.  Waiting on call back from office to verify medications.    He sates he checks his BS 2 times a week.  Encouraged him to at least check once a day.  He has concerns with this as the strip cost money.  He is an HD patient TTS.  A1C 6.9.  States he limits sugary drinks and carbohydrates.  Reviewed hypoglycemia.    Will continue to follow.  Thank you, Reche Dixon, RN, BSN Diabetes Coordinator Inpatient Diabetes Program [phone] (team pager from 8a-5p)   Thank you, Reche Dixon, RN, BSN Diabetes Coordinator Inpatient Diabetes  Program [phone] (team pager from 8a-5p)

## 2019-05-28 NOTE — ED Notes (Signed)
Pt is back from HD, no distress.  Gave pt meal and remote

## 2019-05-28 NOTE — Progress Notes (Signed)
[facility]  ROUNDING NOTE   Subjective:   [name] admitted to [facility] on 05/27/2019 for Weakness [R53.1] Hyperglycemia [R73.9]  Last hemodialysis was Tuesday. Patient brought down for hemodialysis from the ED.   Seen and examined on hemodialysis treatment.   Objective:  Vital signs in last 24 hours:  Temp:  [98.3 F (36.8 C)] 98.3 F (36.8 C) (01/13 1750) Pulse Rate:  [50-83] 67 (01/14 1230) Resp:  [10-19] 16 (01/14 1230) BP: (127-201)/(75-160) 182/97 (01/14 1230) SpO2:  [95 %-100 %] 97 % (01/14 1230) Weight:  [68 kg] 68 kg (01/13 1750)  Weight change:  Filed Weights   05/27/19 1750  Weight: 68 kg    Intake/Output: I/O last 3 completed shifts: In: 900 [IV Piggyback:900] Out: 355 [Urine:355]   Intake/Output this shift:  Total I/O In: 150 [IV Piggyback:150] Out: -   Physical Exam: General: NAD,   Head: Normocephalic, atraumatic. Moist oral mucosal membranes  Eyes: Anicteric, PERRL  Neck: Supple, trachea midline  Lungs:  Clear to auscultation  Heart: Regular rate and rhythm  Abdomen:  Soft, nontender,   Extremities:  no peripheral edema.  Neurologic: Nonfocal, moving all four extremities  Skin: No lesions  Access: RIJ permcath    Basic Metabolic Panel: Recent Labs  Lab 05/27/19 1759 05/27/19 1759 05/27/19 2104 05/27/19 2104 05/28/19 0220 05/28/19 0300 05/28/19 0945  NA 128*  --  129*  --  130* 131* 136  K 2.2*  --  2.3*  --  2.6* 2.5* 2.9*  CL 89*  --  93*  --  96* 94* 98  CO2 31  --  26  --  28 29 28   GLUCOSE 835*  --  781*  --  697* 671* 271*  BUN 11  --  11  --  11 11 12   CREATININE 3.18*  --  3.38*  --  3.33* 3.28* 3.39*  CALCIUM 7.6*   < > 7.3*   < > 7.2* 7.4* 8.0*  MG  --   --  1.6*  --   --   --   --   PHOS  --   --  2.3*  --   --   --   --    < > = values in this interval not displayed.    Liver Function Tests: No results for input(s): AST, ALT, ALKPHOS, BILITOT, PROT, ALBUMIN in the last 168 hours. No results for  input(s): LIPASE, AMYLASE in the last 168 hours. No results for input(s): AMMONIA in the last 168 hours.  CBC: Recent Labs  Lab 05/27/19 1759 05/28/19 0300  WBC 7.0 5.8  HGB 10.0* 9.0*  HCT 31.4* 27.4*  MCV 90.2 88.7  PLT 197 176    Cardiac Enzymes: No results for input(s): CKTOTAL, CKMB, CKMBINDEX, TROPONINI in the last 168 hours.  BNP: Invalid input(s): POCBNP  CBG: Recent Labs  Lab 05/28/19 0043 05/28/19 0519 05/28/19 0812  GLUCAP >600* 580* 417*    Microbiology: Results for orders placed or performed during the hospital encounter of 04/08/19  SARS CORONAVIRUS 2 (TAT 6-24 HRS) Nasopharyngeal Nasopharyngeal Swab     Status: None   Collection Time: 04/08/19 10:49 PM   Specimen: Nasopharyngeal Swab  Result Value Ref Range Status   SARS Coronavirus 2 NEGATIVE NEGATIVE Final    Comment: (NOTE) SARS-CoV-2 target nucleic acids are NOT DETECTED. The SARS-CoV-2 RNA is generally detectable in upper and lower respiratory specimens during the acute phase of infection. Negative results do not preclude  SARS-CoV-2 infection, do not rule out co-infections with other pathogens, and should not be used as the sole basis for treatment or other patient management decisions. Negative results must be combined with clinical observations, patient history, and epidemiological information. The expected result is Negative. Fact Sheet for Patients: SugarRoll.be Fact Sheet for Healthcare Providers: https://www.woods-mathews.com/ This test is not yet approved or cleared by the [place] FDA and  has been authorized for detection and/or diagnosis of SARS-CoV-2 by FDA under an Emergency Use Authorization (EUA). This EUA will remain  in effect (meaning this test can be used) for the duration of the COVID-19 declaration under Section 56 4(b)(1) of the Act, 21 U.S.C. section 360bbb-3(b)(1), unless the authorization is terminated or revoked  sooner. Performed at [facility], [place] [address] [place], [place] [address]   MRSA PCR Screening     Status: None   Collection Time: 04/11/19  2:45 AM   Specimen: Nasal Mucosa; Nasopharyngeal  Result Value Ref Range Status   MRSA by PCR NEGATIVE NEGATIVE Final    Comment:        The GeneXpert MRSA Assay (FDA approved for NASAL specimens only), is one component of a comprehensive MRSA colonization surveillance program. It is not intended to diagnose MRSA infection nor to guide or monitor treatment for MRSA infections. Performed at [facility], [address] [place], [place] [address]     Coagulation Studies: No results for input(s): LABPROT, INR in the last 72 hours.  Urinalysis: Recent Labs    05/27/19 2047  COLORURINE STRAW*  LABSPEC 1.017  PHURINE 6.0  GLUCOSEU >=500*  HGBUR SMALL*  BILIRUBINUR NEGATIVE  KETONESUR NEGATIVE  PROTEINUR >=300*  NITRITE NEGATIVE  LEUKOCYTESUR NEGATIVE      Imaging: CT ABDOMEN PELVIS WO CONTRAST  Result Date: 05/27/2019 CLINICAL DATA:  Right-sided chest pain from a fall EXAM: CT CHEST WITHOUT CONTRAST TECHNIQUE: Multidetector CT imaging of the chest was performed following the standard protocol without IV contrast. COMPARISON:  None. FINDINGS: Cardiovascular: There is mild cardiomegaly. There is no pericardial thickening or effusion. Aortic atherosclerosis is seen. Coronary artery calcifications. Mediastinum/Nodes: A right-sided central venous catheter seen with the tip at the superior cavoatrial junction. Limited due to lack of intravenous contrast. No definite mediastinal adenopathy. The thyroid gland, trachea and esophagus demonstrate no significant findings. Lungs/Pleura: A small right pleural effusion is seen with patchy adjacent airspace opacity. No suspicious pulmonary nodules. There is no pleural effusion. Centrilobular emphysematous changes seen at the lung apices. Upper abdomen: The visualized portion of the  upper abdomen is unremarkable. Musculoskeletal/Chest wall: There is no chest wall mass or suspicious osseous finding. No acute osseous abnormality Abdomen/pelvis: Hepatobiliary: Although limited due to the lack of intravenous contrast, normal in appearance without gross focal abnormality. No evidence of calcified gallstones or biliary ductal dilatation. Pancreas: Coarse calcifications are seen, likely from prior pancreatitis. Spleen: Normal in size. Although limited due to the lack of intravenous contrast, normal in appearance. Adrenals/Urinary Tract: Both adrenal glands appear normal. Bilateral perinephric fat stranding changes are seen. Stomach/Bowel: The stomach, small bowel, and colon are normal in appearance. No inflammatory changes or obstructive findings. Vascular/Lymphatic: There are no enlarged abdominal or pelvic lymph nodes. Scattered aortic atherosclerotic calcifications are seen without aneurysmal dilatation. Reproductive: The prostate is unremarkable. Other: No evidence of abdominal wall mass or hernia. Musculoskeletal: Nondisplaced posterior right eighth rib fracture seen. IMPRESSION: Nondisplaced posterior right eighth rib fracture. Small right pleural effusion with patchy adjacent atelectasis. Limited due to lack  of intravenous contrast, however no definite acute intrathoracic, abdominal, or pelvic injury. Electronically Signed   By: Prudencio Pair M.D.   On: 05/27/2019 22:40   DG Chest 1 View  Result Date: 05/27/2019 CLINICAL DATA:  Right-sided chest pain, fall EXAM: CHEST  1 VIEW COMPARISON:  06/29/2016, 04/14/2019 FINDINGS: Right-sided central venous catheter tip over the cavoatrial region. No focal airspace disease, pleural effusion, or pneumothorax. Acute displaced right eighth and ninth lateral rib fractures IMPRESSION: 1. Negative for airspace disease or pneumothorax 2. Acute displaced right eighth and ninth rib fractures Electronically Signed   By: Donavan Foil M.D.   On: 05/27/2019 21:41    CT Head Wo Contrast  Result Date: 05/27/2019 CLINICAL DATA:  Weakness, fatigue and fall with right-sided pain EXAM: CT HEAD WITHOUT CONTRAST CT CERVICAL SPINE WITHOUT CONTRAST TECHNIQUE: Multidetector CT imaging of the head and cervical spine was performed following the standard protocol without intravenous contrast. Multiplanar CT image reconstructions of the cervical spine were also generated. COMPARISON:  CT head 02/26/2018, MRI 12/31/2015 FINDINGS: CT HEAD FINDINGS Brain: Stable regions of encephalomalacia in the bilateral frontal lobes, insula and right caudate. Additional remote lacunar infarcts in the basal ganglia. No evidence of acute infarction, hemorrhage, hydrocephalus, extra-axial collection or mass lesion/mass effect. Symmetric prominence of the ventricles, cisterns and sulci compatible with parenchymal volume loss. Confluent areas of white matter hypoattenuation are most compatible with chronic microvascular angiopathy. Vascular: Atherosclerotic calcification of the carotid siphons and intradural vertebral arteries. No hyperdense vessel. Skull: No calvarial fracture or suspicious osseous lesion. No scalp swelling or hematoma. Sinuses/Orbits: Chronic opacification of the left frontal sinus. No air-fluid levels are seen. Remaining paranasal sinuses and mastoid air cells are predominantly clear. Included orbital structures are unremarkable. Other: Patient is edentulous. Mild temporomandibular arthrosis. CT CERVICAL SPINE FINDINGS Alignment: No acute traumatic listhesis. Craniocervical atlantoaxial alignment is maintained. Preservation of the normal cervical lordosis. Slight retrolisthesis of C5 on C6 by approximately 2 mm. Likely on a degenerative basis with spondylitic changes and facet degenerative features at this level. No abnormal facet widening, jumped or perched facets. Skull base and vertebrae: No acute fracture. No primary bone lesion or focal pathologic process. Soft tissues and spinal  canal: No pre or paravertebral fluid or swelling. No visible canal hematoma. Disc levels: Multilevel intervertebral disc height loss with spondylitic endplate changes. Additional mild uncinate spurring and minimal facet degenerative changes. No severe canal stenosis or foraminal narrowing. Upper chest: No acute abnormality in the upper chest or imaged lung apices. Other: Normal thyroid. Cervical carotid atherosclerosis. IMPRESSION: 1. No acute intracranial abnormality. No scalp swelling or calvarial fracture. 2. Stable encephalomalacia in the bilateral frontal lobes, insula and right caudate. 3. Background of moderate chronic microvascular angiopathy changes and moderate parenchymal volume loss. 4. No acute fracture or subluxation of the cervical spine. 5. Multilevel degenerative disc disease and facet degenerative changes of the cervical spine. 6. Intracranial and cervical atherosclerosis. Electronically Signed   By: Lovena Le M.D.   On: 05/27/2019 21:35   CT Chest Wo Contrast  Result Date: 05/27/2019 CLINICAL DATA:  Right-sided chest pain from a fall EXAM: CT CHEST WITHOUT CONTRAST TECHNIQUE: Multidetector CT imaging of the chest was performed following the standard protocol without IV contrast. COMPARISON:  None. FINDINGS: Cardiovascular: There is mild cardiomegaly. There is no pericardial thickening or effusion. Aortic atherosclerosis is seen. Coronary artery calcifications. Mediastinum/Nodes: A right-sided central venous catheter seen with the tip at the superior cavoatrial junction. Limited due to lack of intravenous contrast.  No definite mediastinal adenopathy. The thyroid gland, trachea and esophagus demonstrate no significant findings. Lungs/Pleura: A small right pleural effusion is seen with patchy adjacent airspace opacity. No suspicious pulmonary nodules. There is no pleural effusion. Centrilobular emphysematous changes seen at the lung apices. Upper abdomen: The visualized portion of the upper  abdomen is unremarkable. Musculoskeletal/Chest wall: There is no chest wall mass or suspicious osseous finding. No acute osseous abnormality Abdomen/pelvis: Hepatobiliary: Although limited due to the lack of intravenous contrast, normal in appearance without gross focal abnormality. No evidence of calcified gallstones or biliary ductal dilatation. Pancreas: Coarse calcifications are seen, likely from prior pancreatitis. Spleen: Normal in size. Although limited due to the lack of intravenous contrast, normal in appearance. Adrenals/Urinary Tract: Both adrenal glands appear normal. Bilateral perinephric fat stranding changes are seen. Stomach/Bowel: The stomach, small bowel, and colon are normal in appearance. No inflammatory changes or obstructive findings. Vascular/Lymphatic: There are no enlarged abdominal or pelvic lymph nodes. Scattered aortic atherosclerotic calcifications are seen without aneurysmal dilatation. Reproductive: The prostate is unremarkable. Other: No evidence of abdominal wall mass or hernia. Musculoskeletal: Nondisplaced posterior right eighth rib fracture seen. IMPRESSION: Nondisplaced posterior right eighth rib fracture. Small right pleural effusion with patchy adjacent atelectasis. Limited due to lack of intravenous contrast, however no definite acute intrathoracic, abdominal, or pelvic injury. Electronically Signed   By: Prudencio Pair M.D.   On: 05/27/2019 22:40   CT Cervical Spine Wo Contrast  Result Date: 05/27/2019 CLINICAL DATA:  Weakness, fatigue and fall with right-sided pain EXAM: CT HEAD WITHOUT CONTRAST CT CERVICAL SPINE WITHOUT CONTRAST TECHNIQUE: Multidetector CT imaging of the head and cervical spine was performed following the standard protocol without intravenous contrast. Multiplanar CT image reconstructions of the cervical spine were also generated. COMPARISON:  CT head 02/26/2018, MRI 12/31/2015 FINDINGS: CT HEAD FINDINGS Brain: Stable regions of encephalomalacia in the  bilateral frontal lobes, insula and right caudate. Additional remote lacunar infarcts in the basal ganglia. No evidence of acute infarction, hemorrhage, hydrocephalus, extra-axial collection or mass lesion/mass effect. Symmetric prominence of the ventricles, cisterns and sulci compatible with parenchymal volume loss. Confluent areas of white matter hypoattenuation are most compatible with chronic microvascular angiopathy. Vascular: Atherosclerotic calcification of the carotid siphons and intradural vertebral arteries. No hyperdense vessel. Skull: No calvarial fracture or suspicious osseous lesion. No scalp swelling or hematoma. Sinuses/Orbits: Chronic opacification of the left frontal sinus. No air-fluid levels are seen. Remaining paranasal sinuses and mastoid air cells are predominantly clear. Included orbital structures are unremarkable. Other: Patient is edentulous. Mild temporomandibular arthrosis. CT CERVICAL SPINE FINDINGS Alignment: No acute traumatic listhesis. Craniocervical atlantoaxial alignment is maintained. Preservation of the normal cervical lordosis. Slight retrolisthesis of C5 on C6 by approximately 2 mm. Likely on a degenerative basis with spondylitic changes and facet degenerative features at this level. No abnormal facet widening, jumped or perched facets. Skull base and vertebrae: No acute fracture. No primary bone lesion or focal pathologic process. Soft tissues and spinal canal: No pre or paravertebral fluid or swelling. No visible canal hematoma. Disc levels: Multilevel intervertebral disc height loss with spondylitic endplate changes. Additional mild uncinate spurring and minimal facet degenerative changes. No severe canal stenosis or foraminal narrowing. Upper chest: No acute abnormality in the upper chest or imaged lung apices. Other: Normal thyroid. Cervical carotid atherosclerosis. IMPRESSION: 1. No acute intracranial abnormality. No scalp swelling or calvarial fracture. 2. Stable  encephalomalacia in the bilateral frontal lobes, insula and right caudate. 3. Background of moderate chronic microvascular angiopathy changes  and moderate parenchymal volume loss. 4. No acute fracture or subluxation of the cervical spine. 5. Multilevel degenerative disc disease and facet degenerative changes of the cervical spine. 6. Intracranial and cervical atherosclerosis. Electronically Signed   By: Lovena Le M.D.   On: 05/27/2019 21:35   DG HIP UNILAT WITH PELVIS 2-3 VIEWS RIGHT  Result Date: 05/27/2019 CLINICAL DATA:  Fall with hip pain EXAM: DG HIP (WITH OR WITHOUT PELVIS) 2-3V RIGHT COMPARISON:  CT 08/17/2017 FINDINGS: Linear object projects over the right iliac bone. The SI joints are non widened. The pubic symphysis and rami are intact. Both femoral heads project in joint. No fracture or malalignment. Vascular calcifications. IMPRESSION: 1. No acute osseous abnormality 2. Linear object overlying the right iliac bone, either external artifact or potentially foreign body. Electronically Signed   By: Donavan Foil M.D.   On: 05/27/2019 21:43     Medications:    . amLODipine  5 mg Oral Daily  . Chlorhexidine Gluconate Cloth  6 each Topical Q0600  . folic acid  1 mg Oral Daily  . heparin  5,000 Units Subcutaneous Q8H  . insulin aspart  0-15 Units Subcutaneous TID WC  . insulin aspart  0-5 Units Subcutaneous QHS  . insulin glargine  15 Units Subcutaneous Daily  . metoprolol tartrate  50 mg Oral BID  . pantoprazole  40 mg Oral Daily  . sertraline  100 mg Oral Daily   acetaminophen  Assessment/ Plan:  [name] is a 59 y.o. black male with diabetes mellitus type II, hypertension who is admitted to [facility] and initiated on hemodialysis.   [facility] TTS [address] RIJ permcath  1.  ESRD: seen and examined on hemodialysis. 4K bath due to hypokalemia  2.  Anemia of chronic kidney disease.  - EPO    3.  Secondary hyperparathyroidism. With hypocalcemia and  hypophosphatemia - Not on binders.    LOS: 0 Kafi Dotter 1/14/20212:11 PM

## 2019-05-28 NOTE — Progress Notes (Signed)
Patient with belongings of coat, shirt, pants and shoes.

## 2019-05-28 NOTE — Progress Notes (Signed)
   05/28/19 1315  Neurological  Level of Consciousness Alert  Orientation Level Oriented X4  Respiratory  Respiratory Pattern Regular;Unlabored  Bilateral Breath Sounds Clear;Diminished  Cardiac  Pulse Regular  Heart Sounds S1, S2  ECG Monitor Yes  stable for HD vitals stable

## 2019-05-28 NOTE — ED Notes (Signed)
Lab contacted to draw bmp.

## 2019-05-28 NOTE — Progress Notes (Signed)
OT Cancellation Note  Patient Details Name: [name] MRN: [id number] DOB: [birth year]   Cancelled Treatment:    Reason Eval/Treat Not Completed: Patient at procedure or test/ unavailable. OT attempted to see pt for evaluation this date. Upon arrival to room, pt with hospital staff about to be transported to dialysis. Will follow acutely and re-attempt at a later time/date as available and pt medically appropriate for OT evaluation.   Shara Blazing, M.S., OTR/L Ascom: [phone] 05/28/19, 1:51 PM

## 2019-05-28 NOTE — ED Notes (Signed)
Patient resting quietly in room.  He states his pain is unchanged after taking the Tylenol.  He appears weak and tired.  Assisted to use urinal.  Bed in lowest position, call light within reach.  Will continue to monitor.

## 2019-05-28 NOTE — ED Notes (Signed)
Dr Tu at bedside  

## 2019-05-28 NOTE — Evaluation (Signed)
ePhysical Therapy Evaluation Patient Details Name: [name] MRN: [id number] DOB: [birth year] Today's Date: 05/28/2019   History of Present Illness  59 y.o. 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  Clinical Impression  Pt was able to move slowly but relatively safely for quick loop out of the room with walker.  He was able to tolerate appropriate limited in-home distances but had enough fatigue not to push himself.  He reports he mostly uses a cane in the home, but indicated that he may have a walker, regardless he should be using one at this point and will need a walker if he does not have one at home.  Pt showed good effort but is not at his baseline and will need further PT at home to get back to his baseline level.    Follow Up Recommendations Home health PT    Equipment Recommendations  Rolling walker with 5" wheels(should double check on his access to walker?)    Recommendations for Other Services       Precautions / Restrictions Precautions Precautions: Fall Restrictions Weight Bearing Restrictions: No      Mobility  Bed Mobility Overal bed mobility: Needs Assistance Bed Mobility: Supine to Sit;Sit to Supine     Supine to sit: Min guard Sit to supine: Min guard   General bed mobility comments: Pt needed rails with in and out of bed, extra time and cuing but no direct assist   Transfers Overall transfer level: Needs assistance Equipment used: Rolling walker (2 wheeled) Transfers: Sit to/from Stand Sit to Stand: Min guard         General transfer comment: 3 sit to stand attempts, each time he was unable to rise on his first attempt but was able to rise on his own power but needed   Ambulation/Gait Ambulation/Gait assistance: Min guard Gait Distance (Feet): 60 Feet Assistive device:  Rolling walker (2 wheeled)       General Gait Details: Pt was able to do some slow and cautious ambulation with heavy reliance on the walker.  He did improve cadence/confidence with increased steps but then started to feel fatigued.  He reports that he does not feel nearly as steady as his normal, but much better than the previous few days. Pt reports feeling confident about going home despite fatiguing.  Stairs            Wheelchair Mobility    Modified Rankin (Stroke Patients Only)       Balance Overall balance assessment: Needs assistance Sitting-balance support: No upper extremity supported Sitting balance-Leahy Scale: Good Sitting balance - Comments: no issues with sitting balance, good confidence and safety     Standing balance-Leahy Scale: Fair Standing balance comment: reliant on the walker, did manage some shuffling with HHA only but clearly needing something to steady.                             Pertinent Vitals/Pain Pain Assessment: (R rib pain, general pain from multiple falls)    Home Living Family/patient expects to be discharged to:: Private residence Living Arrangements: Spouse/significant other Available Help at Discharge: Family;Available PRN/intermittently Type of Home: House Home Access: Level entry(small step)     Home Layout: One level Home Equipment: Grab bars - tub/shower;Grab bars - toilet;Cane - single point(reports  he may have walker?)      Prior Function Level of Independence: Independent with assistive device(s)         Comments: pt reports independent with all ADLS/IADLs, reports using a SPC for ambulation     Hand Dominance        Extremity/Trunk Assessment   Upper Extremity Assessment Upper Extremity Assessment: Generalized weakness(R grossly weaker than L, some pain from rib fx)    Lower Extremity Assessment Lower Extremity Assessment: Overall WFL for tasks assessed;Generalized weakness        Communication   Communication: No difficulties  Cognition Arousal/Alertness: Awake/alert Behavior During Therapy: WFL for tasks assessed/performed Overall Cognitive Status: Difficult to assess                                 General Comments: confused about date (D,M and Y), aware of situation/setting      General Comments      Exercises     Assessment/Plan    PT Assessment Patient needs continued PT services  PT Problem List Decreased strength;Decreased range of motion;Decreased activity tolerance;Decreased balance;Decreased mobility;Decreased knowledge of use of DME;Decreased knowledge of precautions;Decreased safety awareness;Cardiopulmonary status limiting activity;Pain       PT Treatment Interventions DME instruction;Stair training;Functional mobility training;Therapeutic activities;Therapeutic exercise;Balance training;Neuromuscular re-education;Patient/family education    PT Goals (Current goals can be found in the Care Plan section)  Acute Rehab PT Goals Patient Stated Goal: Go home PT Goal Formulation: With patient Time For Goal Achievement: 06/11/19 Potential to Achieve Goals: Good    Frequency Min 2X/week   Barriers to discharge        Co-evaluation               AM-PAC PT "6 Clicks" Mobility  Outcome Measure Help needed turning from your back to your side while in a flat bed without using bedrails?: None Help needed moving from lying on your back to sitting on the side of a flat bed without using bedrails?: None Help needed moving to and from a bed to a chair (including a wheelchair)?: A Little Help needed standing up from a chair using your arms (e.g., wheelchair or bedside chair)?: None Help needed to walk in hospital room?: A Little Help needed climbing 3-5 steps with a railing? : A Little 6 Click Score: 21    End of Session Equipment Utilized During Treatment: Gait belt Activity Tolerance: Patient tolerated treatment  well;Patient limited by fatigue Patient left: with call bell/phone within reach Nurse Communication: Mobility status PT Visit Diagnosis: Muscle weakness (generalized) (M62.81);Repeated falls (R29.6);Difficulty in walking, not elsewhere classified (R26.2)    Time: 1116-1140 PT Time Calculation (min) (ACUTE ONLY): 24 min   Charges:   PT Evaluation $PT Eval Low Complexity: 1 Low PT Treatments $Gait Training: 8-22 mins        Kreg Shropshire, DPT 05/28/2019, 1:35 PM

## 2019-05-28 NOTE — ED Notes (Signed)
Patient resting quietly in room with no apparent acute distress and expressing no needs at this time.  Toileting offered, bed in lowest position, call light within reach, lights dimmed per request.  Will continue to monitor.

## 2019-05-28 NOTE — Progress Notes (Signed)
OT Cancellation Note  Patient Details Name: [name] MRN: [id number] DOB: 1960-09-23   Cancelled Treatment:    Reason Eval/Treat Not Completed: Medical issues which prohibited therapy. Thank you for the OT consult. Order received and chart reviewed. Pt noted to have most recent lab values outside of the parameters recommended for therapy. Of note potassium 2.5 and blood glucose 671 as of most recent recorded values. Will hold OT evaluation at this time and initiate services as pt medically appropriate.   Shara Blazing, M.S., OTR/L Ascom: [phone] 05/28/19, 8:26 AM

## 2019-05-28 NOTE — ED Notes (Signed)
Randol Kern NP notified of potassium (2.6) and glucose (697) levels.  Per Randol Kern, hold insulin until potassium is over 3.  Ordered another run of potassium at this time.

## 2019-05-29 DIAGNOSIS — R531 Weakness: Secondary | ICD-10-CM

## 2019-05-29 LAB — CBC WITH DIFFERENTIAL/PLATELET
Abs Immature Granulocytes: 0.01 10*3/uL (ref 0.00–0.07)
Basophils Absolute: 0 10*3/uL (ref 0.0–0.1)
Basophils Relative: 0 %
Eosinophils Absolute: 0.2 10*3/uL (ref 0.0–0.5)
Eosinophils Relative: 4 %
HCT: 29.8 % — ABNORMAL LOW (ref 39.0–52.0)
Hemoglobin: 9.5 g/dL — ABNORMAL LOW (ref 13.0–17.0)
Immature Granulocytes: 0 %
Lymphocytes Relative: 27 %
Lymphs Abs: 1.7 10*3/uL (ref 0.7–4.0)
MCH: 28.4 pg (ref 26.0–34.0)
MCHC: 31.9 g/dL (ref 30.0–36.0)
MCV: 89 fL (ref 80.0–100.0)
Monocytes Absolute: 0.5 10*3/uL (ref 0.1–1.0)
Monocytes Relative: 7 %
Neutro Abs: 3.8 10*3/uL (ref 1.7–7.7)
Neutrophils Relative %: 62 %
Platelets: 173 10*3/uL (ref 150–400)
RBC: 3.35 MIL/uL — ABNORMAL LOW (ref 4.22–5.81)
RDW: 11.9 % (ref 11.5–15.5)
WBC: 6.2 10*3/uL (ref 4.0–10.5)
nRBC: 0 % (ref 0.0–0.2)

## 2019-05-29 LAB — BASIC METABOLIC PANEL
Anion gap: 8 (ref 5–15)
BUN: 8 mg/dL (ref 6–20)
CO2: 29 mmol/L (ref 22–32)
Calcium: 7.8 mg/dL — ABNORMAL LOW (ref 8.9–10.3)
Chloride: 100 mmol/L (ref 98–111)
Creatinine, Ser: 2.42 mg/dL — ABNORMAL HIGH (ref 0.61–1.24)
GFR calc Af Amer: 33 mL/min — ABNORMAL LOW (ref >60)
GFR calc non Af Amer: 28 mL/min — ABNORMAL LOW (ref >60)
Glucose, Bld: 291 mg/dL — ABNORMAL HIGH (ref 70–99)
Potassium: 3.4 mmol/L — ABNORMAL LOW (ref 3.5–5.1)
Sodium: 137 mmol/L (ref 135–145)

## 2019-05-29 LAB — GLUCOSE, CAPILLARY
Glucose-Capillary: 303 mg/dL — ABNORMAL HIGH (ref 70–99)
Glucose-Capillary: 230 mg/dL — ABNORMAL HIGH (ref 70–99)

## 2019-05-29 MED ORDER — AMLODIPINE BESYLATE 5 MG PO TABS
5.0000 mg | ORAL_TABLET | Freq: Once | ORAL | Status: AC
  Administered 2019-05-29: 5 mg via ORAL
  Filled 2019-05-29: qty 1

## 2019-05-29 MED ORDER — AMLODIPINE BESYLATE 5 MG PO TABS: 10.0000 mg | ORAL_TABLET | Freq: Every day | ORAL | 0 refills | Status: DC

## 2019-05-29 MED ORDER — TRAMADOL HCL 50 MG PO TABS: 50.0000 mg | ORAL_TABLET | Freq: Two times a day (BID) | ORAL | 0 refills | Status: AC | PRN

## 2019-05-29 NOTE — TOC Transition Note (Signed)
Transition of Care [facility]) - CM/SW Discharge Note   Patient Details  Name: BERK BASOM MRN: [id number] Date of Birth: [birth year]  Transition of Care [facility]) CM/SW Contact:  Beverly Sessions, RN Phone Number: 05/29/2019, 5:02 PM   Clinical Narrative:     Patient to discharge home today  Patient lives at home with wife Sister provides transportation to some appointments.  Patient also uses [facility] [employer].  Denies issues obtaining medications  PT has assessed patient and recommends home health PT.  Patient in agreement, states he does not have a preference of home health agency.  Referral made and accepted by [place] [facility]    Final next level of care: [place] Barriers to Discharge: No Barriers Identified   Patient Goals and CMS Choice        Discharge Placement                       Discharge Plan and Services                          HH Arranged: PT [facility] Agency: [facility] [place] Date [place]: 05/29/19   Representative spoke with at [place]: [place] ([place]) Interventions     Readmission Risk Interventions No flowsheet data found.

## 2019-05-29 NOTE — Progress Notes (Signed)
[facility]  ROUNDING NOTE   Subjective:   Sister at bedside.   Patient had hemodialysis treatment yesterday. Tolerated treatment well. UF of 2.5 liters.   Objective:  Vital signs in last 24 hours:  Temp:  [97.9 F (36.6 C)-98.4 F (36.9 C)] 98 F (36.7 C) (01/15 1308) Pulse Rate:  [61-75] 65 (01/15 1308) Resp:  [16-21] 18 (01/15 1308) BP: (140-195)/(88-120) 141/94 (01/15 1308) SpO2:  [93 %-100 %] 100 % (01/15 1308)  Weight change:  Filed Weights   05/27/19 1750  Weight: 68 kg    Intake/Output: I/O last 3 completed shifts: In: 1050 [IV Piggyback:1050] Out: 2855 [Urine:355; Other:2500]   Intake/Output this shift:  No intake/output data recorded.  Physical Exam: General: NAD, laying in bed  Head: Normocephalic, atraumatic. Moist oral mucosal membranes  Eyes: Anicteric, PERRL  Neck: Supple, trachea midline  Lungs:  Clear to auscultation  Heart: Regular rate and rhythm  Abdomen:  Soft, nontender,   Extremities:  no peripheral edema.  Neurologic: Nonfocal, moving all four extremities  Skin: No lesions  Access: RIJ permcath    Basic Metabolic Panel: Recent Labs  Lab 05/27/19 2104 05/28/19 0220 05/28/19 0300 05/28/19 0300 05/28/19 0945 05/28/19 0945 05/28/19 1858 05/28/19 2220 05/29/19 0737  NA 129*   < > 131*  --  136  --  136 134* 137  K 2.3*   < > 2.5*  --  2.9*  --  3.5 3.6 3.4*  CL 93*   < > 94*  --  98  --  99 99 100  CO2 26   < > 29  --  28  --  29 26 29   GLUCOSE 781*   < > 671*  --  271*  --  217* 264* 291*  BUN 11   < > 11  --  12  --  6 6 8   CREATININE 3.38*   < > 3.28*  --  3.39*  --  1.79* 1.97* 2.42*  CALCIUM 7.3*   < > 7.4*   < > 8.0*   < > 7.9* 7.8* 7.8*  MG 1.6*  --   --   --   --   --   --   --   --   PHOS 2.3*  --   --   --   --   --   --   --   --    < > = values in this interval not displayed.    Liver Function Tests: No results for input(s): AST, ALT, ALKPHOS, BILITOT, PROT, ALBUMIN in the last 168 hours. No  results for input(s): LIPASE, AMYLASE in the last 168 hours. No results for input(s): AMMONIA in the last 168 hours.  CBC: Recent Labs  Lab 05/27/19 1759 05/28/19 0300 05/29/19 0737  WBC 7.0 5.8 6.2  NEUTROABS  --   --  3.8  HGB 10.0* 9.0* 9.5*  HCT 31.4* 27.4* 29.8*  MCV 90.2 88.7 89.0  PLT 197 176 173    Cardiac Enzymes: No results for input(s): CKTOTAL, CKMB, CKMBINDEX, TROPONINI in the last 168 hours.  BNP: Invalid input(s): POCBNP  CBG: Recent Labs  Lab 05/28/19 0812 05/28/19 1806 05/28/19 2156 05/29/19 0948 05/29/19 1146  GLUCAP 417* 153* 241* 303* 230*    Microbiology: Results for orders placed or performed during the hospital encounter of 05/27/19  SARS CORONAVIRUS 2 (TAT 6-24 HRS) Nasopharyngeal Nasopharyngeal Swab     Status: None   Collection Time: 05/28/19 11:09 AM  Specimen: Nasopharyngeal Swab  Result Value Ref Range Status   SARS Coronavirus 2 NEGATIVE NEGATIVE Final    Comment: (NOTE) SARS-CoV-2 target nucleic acids are NOT DETECTED. The SARS-CoV-2 RNA is generally detectable in upper and lower respiratory specimens during the acute phase of infection. Negative results do not preclude SARS-CoV-2 infection, do not rule out co-infections with other pathogens, and should not be used as the sole basis for treatment or other patient management decisions. Negative results must be combined with clinical observations, patient history, and epidemiological information. The expected result is Negative. Fact Sheet for Patients: SugarRoll.be Fact Sheet for Healthcare Providers: https://www.woods-mathews.com/ This test is not yet approved or cleared by the [place] FDA and  has been authorized for detection and/or diagnosis of SARS-CoV-2 by FDA under an Emergency Use Authorization (EUA). This EUA will remain  in effect (meaning this test can be used) for the duration of the COVID-19 declaration under Section 56  4(b)(1) of the Act, 21 U.S.C. section 360bbb-3(b)(1), unless the authorization is terminated or revoked sooner. Performed at [facility], [place] [address] [place], [place] [address]     Coagulation Studies: No results for input(s): LABPROT, INR in the last 72 hours.  Urinalysis: Recent Labs    05/27/19 2047  COLORURINE STRAW*  LABSPEC 1.017  PHURINE 6.0  GLUCOSEU >=500*  HGBUR SMALL*  BILIRUBINUR NEGATIVE  KETONESUR NEGATIVE  PROTEINUR >=300*  NITRITE NEGATIVE  LEUKOCYTESUR NEGATIVE      Imaging: CT ABDOMEN PELVIS WO CONTRAST  Result Date: 05/27/2019 CLINICAL DATA:  Right-sided chest pain from a fall EXAM: CT CHEST WITHOUT CONTRAST TECHNIQUE: Multidetector CT imaging of the chest was performed following the standard protocol without IV contrast. COMPARISON:  None. FINDINGS: Cardiovascular: There is mild cardiomegaly. There is no pericardial thickening or effusion. Aortic atherosclerosis is seen. Coronary artery calcifications. Mediastinum/Nodes: A right-sided central venous catheter seen with the tip at the superior cavoatrial junction. Limited due to lack of intravenous contrast. No definite mediastinal adenopathy. The thyroid gland, trachea and esophagus demonstrate no significant findings. Lungs/Pleura: A small right pleural effusion is seen with patchy adjacent airspace opacity. No suspicious pulmonary nodules. There is no pleural effusion. Centrilobular emphysematous changes seen at the lung apices. Upper abdomen: The visualized portion of the upper abdomen is unremarkable. Musculoskeletal/Chest wall: There is no chest wall mass or suspicious osseous finding. No acute osseous abnormality Abdomen/pelvis: Hepatobiliary: Although limited due to the lack of intravenous contrast, normal in appearance without gross focal abnormality. No evidence of calcified gallstones or biliary ductal dilatation. Pancreas: Coarse calcifications are seen, likely from prior pancreatitis. Spleen:  Normal in size. Although limited due to the lack of intravenous contrast, normal in appearance. Adrenals/Urinary Tract: Both adrenal glands appear normal. Bilateral perinephric fat stranding changes are seen. Stomach/Bowel: The stomach, small bowel, and colon are normal in appearance. No inflammatory changes or obstructive findings. Vascular/Lymphatic: There are no enlarged abdominal or pelvic lymph nodes. Scattered aortic atherosclerotic calcifications are seen without aneurysmal dilatation. Reproductive: The prostate is unremarkable. Other: No evidence of abdominal wall mass or hernia. Musculoskeletal: Nondisplaced posterior right eighth rib fracture seen. IMPRESSION: Nondisplaced posterior right eighth rib fracture. Small right pleural effusion with patchy adjacent atelectasis. Limited due to lack of intravenous contrast, however no definite acute intrathoracic, abdominal, or pelvic injury. Electronically Signed   By: Prudencio Pair M.D.   On: 05/27/2019 22:40   DG Chest 1 View  Result Date: 05/27/2019 CLINICAL DATA:  Right-sided chest pain, fall EXAM: CHEST  1  VIEW COMPARISON:  06/29/2016, 04/14/2019 FINDINGS: Right-sided central venous catheter tip over the cavoatrial region. No focal airspace disease, pleural effusion, or pneumothorax. Acute displaced right eighth and ninth lateral rib fractures IMPRESSION: 1. Negative for airspace disease or pneumothorax 2. Acute displaced right eighth and ninth rib fractures Electronically Signed   By: Donavan Foil M.D.   On: 05/27/2019 21:41   CT Head Wo Contrast  Result Date: 05/27/2019 CLINICAL DATA:  Weakness, fatigue and fall with right-sided pain EXAM: CT HEAD WITHOUT CONTRAST CT CERVICAL SPINE WITHOUT CONTRAST TECHNIQUE: Multidetector CT imaging of the head and cervical spine was performed following the standard protocol without intravenous contrast. Multiplanar CT image reconstructions of the cervical spine were also generated. COMPARISON:  CT head 02/26/2018,  MRI 12/31/2015 FINDINGS: CT HEAD FINDINGS Brain: Stable regions of encephalomalacia in the bilateral frontal lobes, insula and right caudate. Additional remote lacunar infarcts in the basal ganglia. No evidence of acute infarction, hemorrhage, hydrocephalus, extra-axial collection or mass lesion/mass effect. Symmetric prominence of the ventricles, cisterns and sulci compatible with parenchymal volume loss. Confluent areas of white matter hypoattenuation are most compatible with chronic microvascular angiopathy. Vascular: Atherosclerotic calcification of the carotid siphons and intradural vertebral arteries. No hyperdense vessel. Skull: No calvarial fracture or suspicious osseous lesion. No scalp swelling or hematoma. Sinuses/Orbits: Chronic opacification of the left frontal sinus. No air-fluid levels are seen. Remaining paranasal sinuses and mastoid air cells are predominantly clear. Included orbital structures are unremarkable. Other: Patient is edentulous. Mild temporomandibular arthrosis. CT CERVICAL SPINE FINDINGS Alignment: No acute traumatic listhesis. Craniocervical atlantoaxial alignment is maintained. Preservation of the normal cervical lordosis. Slight retrolisthesis of C5 on C6 by approximately 2 mm. Likely on a degenerative basis with spondylitic changes and facet degenerative features at this level. No abnormal facet widening, jumped or perched facets. Skull base and vertebrae: No acute fracture. No primary bone lesion or focal pathologic process. Soft tissues and spinal canal: No pre or paravertebral fluid or swelling. No visible canal hematoma. Disc levels: Multilevel intervertebral disc height loss with spondylitic endplate changes. Additional mild uncinate spurring and minimal facet degenerative changes. No severe canal stenosis or foraminal narrowing. Upper chest: No acute abnormality in the upper chest or imaged lung apices. Other: Normal thyroid. Cervical carotid atherosclerosis. IMPRESSION: 1.  No acute intracranial abnormality. No scalp swelling or calvarial fracture. 2. Stable encephalomalacia in the bilateral frontal lobes, insula and right caudate. 3. Background of moderate chronic microvascular angiopathy changes and moderate parenchymal volume loss. 4. No acute fracture or subluxation of the cervical spine. 5. Multilevel degenerative disc disease and facet degenerative changes of the cervical spine. 6. Intracranial and cervical atherosclerosis. Electronically Signed   By: Lovena Le M.D.   On: 05/27/2019 21:35   CT Chest Wo Contrast  Result Date: 05/27/2019 CLINICAL DATA:  Right-sided chest pain from a fall EXAM: CT CHEST WITHOUT CONTRAST TECHNIQUE: Multidetector CT imaging of the chest was performed following the standard protocol without IV contrast. COMPARISON:  None. FINDINGS: Cardiovascular: There is mild cardiomegaly. There is no pericardial thickening or effusion. Aortic atherosclerosis is seen. Coronary artery calcifications. Mediastinum/Nodes: A right-sided central venous catheter seen with the tip at the superior cavoatrial junction. Limited due to lack of intravenous contrast. No definite mediastinal adenopathy. The thyroid gland, trachea and esophagus demonstrate no significant findings. Lungs/Pleura: A small right pleural effusion is seen with patchy adjacent airspace opacity. No suspicious pulmonary nodules. There is no pleural effusion. Centrilobular emphysematous changes seen at the lung apices. Upper abdomen: The  visualized portion of the upper abdomen is unremarkable. Musculoskeletal/Chest wall: There is no chest wall mass or suspicious osseous finding. No acute osseous abnormality Abdomen/pelvis: Hepatobiliary: Although limited due to the lack of intravenous contrast, normal in appearance without gross focal abnormality. No evidence of calcified gallstones or biliary ductal dilatation. Pancreas: Coarse calcifications are seen, likely from prior pancreatitis. Spleen: Normal in  size. Although limited due to the lack of intravenous contrast, normal in appearance. Adrenals/Urinary Tract: Both adrenal glands appear normal. Bilateral perinephric fat stranding changes are seen. Stomach/Bowel: The stomach, small bowel, and colon are normal in appearance. No inflammatory changes or obstructive findings. Vascular/Lymphatic: There are no enlarged abdominal or pelvic lymph nodes. Scattered aortic atherosclerotic calcifications are seen without aneurysmal dilatation. Reproductive: The prostate is unremarkable. Other: No evidence of abdominal wall mass or hernia. Musculoskeletal: Nondisplaced posterior right eighth rib fracture seen. IMPRESSION: Nondisplaced posterior right eighth rib fracture. Small right pleural effusion with patchy adjacent atelectasis. Limited due to lack of intravenous contrast, however no definite acute intrathoracic, abdominal, or pelvic injury. Electronically Signed   By: Prudencio Pair M.D.   On: 05/27/2019 22:40   CT Cervical Spine Wo Contrast  Result Date: 05/27/2019 CLINICAL DATA:  Weakness, fatigue and fall with right-sided pain EXAM: CT HEAD WITHOUT CONTRAST CT CERVICAL SPINE WITHOUT CONTRAST TECHNIQUE: Multidetector CT imaging of the head and cervical spine was performed following the standard protocol without intravenous contrast. Multiplanar CT image reconstructions of the cervical spine were also generated. COMPARISON:  CT head 02/26/2018, MRI 12/31/2015 FINDINGS: CT HEAD FINDINGS Brain: Stable regions of encephalomalacia in the bilateral frontal lobes, insula and right caudate. Additional remote lacunar infarcts in the basal ganglia. No evidence of acute infarction, hemorrhage, hydrocephalus, extra-axial collection or mass lesion/mass effect. Symmetric prominence of the ventricles, cisterns and sulci compatible with parenchymal volume loss. Confluent areas of white matter hypoattenuation are most compatible with chronic microvascular angiopathy. Vascular:  Atherosclerotic calcification of the carotid siphons and intradural vertebral arteries. No hyperdense vessel. Skull: No calvarial fracture or suspicious osseous lesion. No scalp swelling or hematoma. Sinuses/Orbits: Chronic opacification of the left frontal sinus. No air-fluid levels are seen. Remaining paranasal sinuses and mastoid air cells are predominantly clear. Included orbital structures are unremarkable. Other: Patient is edentulous. Mild temporomandibular arthrosis. CT CERVICAL SPINE FINDINGS Alignment: No acute traumatic listhesis. Craniocervical atlantoaxial alignment is maintained. Preservation of the normal cervical lordosis. Slight retrolisthesis of C5 on C6 by approximately 2 mm. Likely on a degenerative basis with spondylitic changes and facet degenerative features at this level. No abnormal facet widening, jumped or perched facets. Skull base and vertebrae: No acute fracture. No primary bone lesion or focal pathologic process. Soft tissues and spinal canal: No pre or paravertebral fluid or swelling. No visible canal hematoma. Disc levels: Multilevel intervertebral disc height loss with spondylitic endplate changes. Additional mild uncinate spurring and minimal facet degenerative changes. No severe canal stenosis or foraminal narrowing. Upper chest: No acute abnormality in the upper chest or imaged lung apices. Other: Normal thyroid. Cervical carotid atherosclerosis. IMPRESSION: 1. No acute intracranial abnormality. No scalp swelling or calvarial fracture. 2. Stable encephalomalacia in the bilateral frontal lobes, insula and right caudate. 3. Background of moderate chronic microvascular angiopathy changes and moderate parenchymal volume loss. 4. No acute fracture or subluxation of the cervical spine. 5. Multilevel degenerative disc disease and facet degenerative changes of the cervical spine. 6. Intracranial and cervical atherosclerosis. Electronically Signed   By: Lovena Le M.D.   On: 05/27/2019  21:35  DG HIP UNILAT WITH PELVIS 2-3 VIEWS RIGHT  Result Date: 05/27/2019 CLINICAL DATA:  Fall with hip pain EXAM: DG HIP (WITH OR WITHOUT PELVIS) 2-3V RIGHT COMPARISON:  CT 08/17/2017 FINDINGS: Linear object projects over the right iliac bone. The SI joints are non widened. The pubic symphysis and rami are intact. Both femoral heads project in joint. No fracture or malalignment. Vascular calcifications. IMPRESSION: 1. No acute osseous abnormality 2. Linear object overlying the right iliac bone, either external artifact or potentially foreign body. Electronically Signed   By: Donavan Foil M.D.   On: 05/27/2019 21:43     Medications:    . amLODipine  5 mg Oral Daily  . Chlorhexidine Gluconate Cloth  6 each Topical Q0600  . [START ON 05/30/2019] epoetin (EPOGEN/PROCRIT) injection  10,000 Units Intravenous Q T,Th,Sa-HD  . folic acid  1 mg Oral Daily  . heparin  5,000 Units Subcutaneous Q8H  . insulin aspart  0-15 Units Subcutaneous TID WC  . insulin aspart  0-5 Units Subcutaneous QHS  . insulin glargine  15 Units Subcutaneous Daily  . metoprolol tartrate  50 mg Oral BID  . pantoprazole  40 mg Oral Daily  . sertraline  100 mg Oral Daily   acetaminophen  Assessment/ Plan:  [name] is a 59 y.o. black male with diabetes mellitus type II, hypertension who is admitted to [facility] and initiated on hemodialysis.   [facility] TTS [address] RIJ permcath 60.5kg  1.  ESRD: hemodialysis treatment yesterday 4K bath yesterday - Continue TTS schedule  2.  Anemia of chronic kidney disease: hemoglobin 9.5 - EPO with HD treatment.   3.  Secondary hyperparathyroidism. With hypocalcemia and hypophosphatemia - Not on binders.   4. Hypertension: 141/89 - elevated.  - amlodipine    LOS: 1 Taris Galindo 1/15/20212:50 PM

## 2019-05-29 NOTE — Care Management Obs Status (Signed)
[place] NOTIFICATION   Patient Details  Name: STONIE CUMBERBATCH MRN: [id number] Date of Birth: [birth year]   Medicare Observation Status Notification Given:  No(admitted obs less than 24 hours)    Beverly Sessions, RN 05/29/2019, 10:51 AM

## 2019-05-29 NOTE — Discharge Summary (Signed)
[facility] Discharge Summary   [name] [id number]  PCP: [name], MD  Date of admission: 05/27/2019   Date of discharge: 05/29/2019      Discharge Diagnoses:  Principal diagnosis Hypokalemia  Principal Problem:   Weakness Active Problems:   GERD (gastroesophageal reflux disease)   HTN (hypertension)   ESRD (end stage renal disease) ([place])   Hyperglycemia   Fall at home, initial encounter   Rib fracture   Depression   Hypokalemia   Admitted From: Home Disposition:  Home with home health  Recommendations for Outpatient Follow-up:  1. PCP: Follow-up with PCP in 1 week 2. Follow up LABS/TEST: Repeat BMP in 1 week, also address blood pressure medicines  Follow-up Information    [name], MD. Go on 06/04/2019.   Specialty: Internal Medicine Why: 10:30 appointment Contact information: [place] [id number] [phone]          Diet recommendation: Renal diet  Activity: The patient is advised to gradually reintroduce usual activities, as tolerated  Discharge Condition: stable  Code Status: Full code   History of present illness: As per the H and P dictated on admission, "[name] is a 59 y.o. 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 Patient states that he was in the living room today and staggered backwards and fell hitting his head. He was unable to get up for 30 minutes. He then made it into the kitchen and fell again and laid there for 30 minutes. Then he decided to lay in bed for awhile to see if he would feel better. Later got up again and fell in the living room. He then called his sister who helped him up and called EMS. He reports being dizziness during those episodes but no loss of consciousness.  States he falls about once a week and has been falling more since he started to get dialysis. Normally uses a walker to ambulate and was using it during his falls.  He would like to get assistance in his home due to his frequent falls.  Also notes poor appetite for the past 6 months and only eats one meal a day although he tries to stay hydrated. "  Hospital Course:  Presented with severe hypokalemia with generalized weakness. Hypokalemia was replaced aggressively in the ER. Patient also had significant hyperglycemia which was aggressively treated. After sessions of HD potassium actually improved. Patient's oral intake improved as well as sugars.  Summary of his active problems in the hospital is as following. Hypokalemia Aggressively treated. After HD potassium is actually remaining stable without any intervention. Discharge home with follow-up with PCP with repeat BMP   Hyperglycemia in the setting of insulin dependent Type 2 diabetes Uncontrolled diabetes. A1c significantly elevated. We will continue current regimen on discharge for now.  Pseudonatremia Corrected to 140 due to hyperglycemia  Posterior right 8th rib fracture s/p fall PRN pain management   Recurrent falls Negative orthostatics  PT/OT recommends rolling walker and home health which is arranged  ESRD Tu/Thur/Sat Appreciate nephrology assistance with dialysis.  Hypertension Continue amlodipine and metoprolol  Blood pressure not well controlled.  Will increase the dose of the Norvasc on discharge.  GERD Continue PPI  Depression  Pt endorse multiple family members passing within the past few months. Denies suicidal ideations Continue sertraline  Pain control from rib cage pain due to rib fracture - [facility] Controlled Substance Reporting System database was reviewed. - 5 day supply was provided. - Patient was instructed, not to drive, operate heavy machinery, perform activities at heights,  swimming or participation in water activities or provide baby sitting services while on Pain, Sleep and Anxiety Medications; until his outpatient Physician has advised to do so again.  - Also recommended to not to take more than prescribed Pain, Sleep and Anxiety Medications.  Patient was seen by physical therapy, who recommended Home health, which was arranged. On the day of the discharge the patient's vitals were stable, and no other acute medical condition were reported by patient. the patient was felt safe to be discharge at Home with Home health.  Consultants: Nephrology Procedures: HD  Discharge Exam: General: Appear in no distress, no Rash; Oral Mucosa Clear, moist. Cardiovascular: S1 and S2 Present, no Murmur, Respiratory: normal respiratory effort, Bilateral Air entry present and no Crackles, no wheezes Abdomen: Bowel Sound present, Soft and no tenderness, no hernia Extremities: no Pedal edema, no calf tenderness Neurology: alert and oriented to time, place, and person affect appropriate.  Filed Weights   05/27/19 1750  Weight: 68 kg   Vitals:   05/29/19 1003 05/29/19 1308  BP: (!) 186/91 (!) 141/94  Pulse: 66 65  Resp: 18 18  Temp: 98 F (36.7 C) 98 F (36.7 C)  SpO2: 99% 100%    DISCHARGE MEDICATION: Allergies as of 05/29/2019      Reactions   Ferrous Gluconate Nausea And Vomiting      Medication List    TAKE these medications   amLODipine 5 MG tablet Commonly known as: NORVASC Take 2 tablets (10 mg total) by mouth daily. What changed: how much to take   folic acid 1 MG tablet Commonly known as: FOLVITE Take 1 mg by mouth daily.   hydrocortisone 2.5 % cream Apply 1 application topically 2 (two) times daily as needed (rash).   insulin glargine 100 UNIT/ML injection Commonly known as: LANTUS Inject 0.15 mLs (15 Units total) into the skin daily. What changed:   when to take this  reasons to take this   metoprolol tartrate 50 MG tablet Commonly  known as: LOPRESSOR Take 50 mg by mouth 2 (two) times daily.   pantoprazole 40 MG tablet Commonly known as: PROTONIX Take 40 mg by mouth daily.   sertraline 100 MG tablet Commonly known as: ZOLOFT Take 100 mg by mouth daily.   traMADol 50 MG tablet Commonly known as: Ultram Take 1 tablet (50 mg total) by mouth every 12 (twelve) hours as needed for up to 3 days.            Durable Medical Equipment  (From admission, onward)         Start     Ordered   05/29/19 0947  For home use only DME Walker rolling  Once    Question Answer Comment  Walker: With 5 Inch Wheels   Patient needs a walker to treat with the following condition Physical deconditioning      05/29/19 0946         Allergies  Allergen Reactions  . Ferrous Gluconate Nausea And Vomiting   Discharge Instructions    Diet - low sodium heart healthy   Complete by: As directed    Increase activity slowly   Complete by: As directed       The results of significant diagnostics from this  hospitalization (including imaging, microbiology, ancillary and laboratory) are listed below for reference.    Significant Diagnostic Studies: CT ABDOMEN PELVIS WO CONTRAST  Result Date: 05/27/2019 CLINICAL DATA:  Right-sided chest pain from a fall EXAM: CT CHEST WITHOUT CONTRAST TECHNIQUE: Multidetector CT imaging of the chest was performed following the standard protocol without IV contrast. COMPARISON:  None. FINDINGS: Cardiovascular: There is mild cardiomegaly. There is no pericardial thickening or effusion. Aortic atherosclerosis is seen. Coronary artery calcifications. Mediastinum/Nodes: A right-sided central venous catheter seen with the tip at the superior cavoatrial junction. Limited due to lack of intravenous contrast. No definite mediastinal adenopathy. The thyroid gland, trachea and esophagus demonstrate no significant findings. Lungs/Pleura: A small right pleural effusion is seen with patchy adjacent airspace opacity.  No suspicious pulmonary nodules. There is no pleural effusion. Centrilobular emphysematous changes seen at the lung apices. Upper abdomen: The visualized portion of the upper abdomen is unremarkable. Musculoskeletal/Chest wall: There is no chest wall mass or suspicious osseous finding. No acute osseous abnormality Abdomen/pelvis: Hepatobiliary: Although limited due to the lack of intravenous contrast, normal in appearance without gross focal abnormality. No evidence of calcified gallstones or biliary ductal dilatation. Pancreas: Coarse calcifications are seen, likely from prior pancreatitis. Spleen: Normal in size. Although limited due to the lack of intravenous contrast, normal in appearance. Adrenals/Urinary Tract: Both adrenal glands appear normal. Bilateral perinephric fat stranding changes are seen. Stomach/Bowel: The stomach, small bowel, and colon are normal in appearance. No inflammatory changes or obstructive findings. Vascular/Lymphatic: There are no enlarged abdominal or pelvic lymph nodes. Scattered aortic atherosclerotic calcifications are seen without aneurysmal dilatation. Reproductive: The prostate is unremarkable. Other: No evidence of abdominal wall mass or hernia. Musculoskeletal: Nondisplaced posterior right eighth rib fracture seen. IMPRESSION: Nondisplaced posterior right eighth rib fracture. Small right pleural effusion with patchy adjacent atelectasis. Limited due to lack of intravenous contrast, however no definite acute intrathoracic, abdominal, or pelvic injury. Electronically Signed   By: Prudencio Pair M.D.   On: 05/27/2019 22:40   DG Chest 1 View  Result Date: 05/27/2019 CLINICAL DATA:  Right-sided chest pain, fall EXAM: CHEST  1 VIEW COMPARISON:  06/29/2016, 04/14/2019 FINDINGS: Right-sided central venous catheter tip over the cavoatrial region. No focal airspace disease, pleural effusion, or pneumothorax. Acute displaced right eighth and ninth lateral rib fractures IMPRESSION: 1.  Negative for airspace disease or pneumothorax 2. Acute displaced right eighth and ninth rib fractures Electronically Signed   By: Donavan Foil M.D.   On: 05/27/2019 21:41   CT Head Wo Contrast  Result Date: 05/27/2019 CLINICAL DATA:  Weakness, fatigue and fall with right-sided pain EXAM: CT HEAD WITHOUT CONTRAST CT CERVICAL SPINE WITHOUT CONTRAST TECHNIQUE: Multidetector CT imaging of the head and cervical spine was performed following the standard protocol without intravenous contrast. Multiplanar CT image reconstructions of the cervical spine were also generated. COMPARISON:  CT head 02/26/2018, MRI 12/31/2015 FINDINGS: CT HEAD FINDINGS Brain: Stable regions of encephalomalacia in the bilateral frontal lobes, insula and right caudate. Additional remote lacunar infarcts in the basal ganglia. No evidence of acute infarction, hemorrhage, hydrocephalus, extra-axial collection or mass lesion/mass effect. Symmetric prominence of the ventricles, cisterns and sulci compatible with parenchymal volume loss. Confluent areas of white matter hypoattenuation are most compatible with chronic microvascular angiopathy. Vascular: Atherosclerotic calcification of the carotid siphons and intradural vertebral arteries. No hyperdense vessel. Skull: No calvarial fracture or suspicious osseous lesion. No scalp swelling or hematoma. Sinuses/Orbits: Chronic opacification of the left frontal sinus. No air-fluid levels are seen.  Remaining paranasal sinuses and mastoid air cells are predominantly clear. Included orbital structures are unremarkable. Other: Patient is edentulous. Mild temporomandibular arthrosis. CT CERVICAL SPINE FINDINGS Alignment: No acute traumatic listhesis. Craniocervical atlantoaxial alignment is maintained. Preservation of the normal cervical lordosis. Slight retrolisthesis of C5 on C6 by approximately 2 mm. Likely on a degenerative basis with spondylitic changes and facet degenerative features at this level. No  abnormal facet widening, jumped or perched facets. Skull base and vertebrae: No acute fracture. No primary bone lesion or focal pathologic process. Soft tissues and spinal canal: No pre or paravertebral fluid or swelling. No visible canal hematoma. Disc levels: Multilevel intervertebral disc height loss with spondylitic endplate changes. Additional mild uncinate spurring and minimal facet degenerative changes. No severe canal stenosis or foraminal narrowing. Upper chest: No acute abnormality in the upper chest or imaged lung apices. Other: Normal thyroid. Cervical carotid atherosclerosis. IMPRESSION: 1. No acute intracranial abnormality. No scalp swelling or calvarial fracture. 2. Stable encephalomalacia in the bilateral frontal lobes, insula and right caudate. 3. Background of moderate chronic microvascular angiopathy changes and moderate parenchymal volume loss. 4. No acute fracture or subluxation of the cervical spine. 5. Multilevel degenerative disc disease and facet degenerative changes of the cervical spine. 6. Intracranial and cervical atherosclerosis. Electronically Signed   By: Lovena Le M.D.   On: 05/27/2019 21:35   CT Chest Wo Contrast  Result Date: 05/27/2019 CLINICAL DATA:  Right-sided chest pain from a fall EXAM: CT CHEST WITHOUT CONTRAST TECHNIQUE: Multidetector CT imaging of the chest was performed following the standard protocol without IV contrast. COMPARISON:  None. FINDINGS: Cardiovascular: There is mild cardiomegaly. There is no pericardial thickening or effusion. Aortic atherosclerosis is seen. Coronary artery calcifications. Mediastinum/Nodes: A right-sided central venous catheter seen with the tip at the superior cavoatrial junction. Limited due to lack of intravenous contrast. No definite mediastinal adenopathy. The thyroid gland, trachea and esophagus demonstrate no significant findings. Lungs/Pleura: A small right pleural effusion is seen with patchy adjacent airspace opacity. No  suspicious pulmonary nodules. There is no pleural effusion. Centrilobular emphysematous changes seen at the lung apices. Upper abdomen: The visualized portion of the upper abdomen is unremarkable. Musculoskeletal/Chest wall: There is no chest wall mass or suspicious osseous finding. No acute osseous abnormality Abdomen/pelvis: Hepatobiliary: Although limited due to the lack of intravenous contrast, normal in appearance without gross focal abnormality. No evidence of calcified gallstones or biliary ductal dilatation. Pancreas: Coarse calcifications are seen, likely from prior pancreatitis. Spleen: Normal in size. Although limited due to the lack of intravenous contrast, normal in appearance. Adrenals/Urinary Tract: Both adrenal glands appear normal. Bilateral perinephric fat stranding changes are seen. Stomach/Bowel: The stomach, small bowel, and colon are normal in appearance. No inflammatory changes or obstructive findings. Vascular/Lymphatic: There are no enlarged abdominal or pelvic lymph nodes. Scattered aortic atherosclerotic calcifications are seen without aneurysmal dilatation. Reproductive: The prostate is unremarkable. Other: No evidence of abdominal wall mass or hernia. Musculoskeletal: Nondisplaced posterior right eighth rib fracture seen. IMPRESSION: Nondisplaced posterior right eighth rib fracture. Small right pleural effusion with patchy adjacent atelectasis. Limited due to lack of intravenous contrast, however no definite acute intrathoracic, abdominal, or pelvic injury. Electronically Signed   By: Prudencio Pair M.D.   On: 05/27/2019 22:40   CT Cervical Spine Wo Contrast  Result Date: 05/27/2019 CLINICAL DATA:  Weakness, fatigue and fall with right-sided pain EXAM: CT HEAD WITHOUT CONTRAST CT CERVICAL SPINE WITHOUT CONTRAST TECHNIQUE: Multidetector CT imaging of the head and cervical spine was  performed following the standard protocol without intravenous contrast. Multiplanar CT image  reconstructions of the cervical spine were also generated. COMPARISON:  CT head 02/26/2018, MRI 12/31/2015 FINDINGS: CT HEAD FINDINGS Brain: Stable regions of encephalomalacia in the bilateral frontal lobes, insula and right caudate. Additional remote lacunar infarcts in the basal ganglia. No evidence of acute infarction, hemorrhage, hydrocephalus, extra-axial collection or mass lesion/mass effect. Symmetric prominence of the ventricles, cisterns and sulci compatible with parenchymal volume loss. Confluent areas of white matter hypoattenuation are most compatible with chronic microvascular angiopathy. Vascular: Atherosclerotic calcification of the carotid siphons and intradural vertebral arteries. No hyperdense vessel. Skull: No calvarial fracture or suspicious osseous lesion. No scalp swelling or hematoma. Sinuses/Orbits: Chronic opacification of the left frontal sinus. No air-fluid levels are seen. Remaining paranasal sinuses and mastoid air cells are predominantly clear. Included orbital structures are unremarkable. Other: Patient is edentulous. Mild temporomandibular arthrosis. CT CERVICAL SPINE FINDINGS Alignment: No acute traumatic listhesis. Craniocervical atlantoaxial alignment is maintained. Preservation of the normal cervical lordosis. Slight retrolisthesis of C5 on C6 by approximately 2 mm. Likely on a degenerative basis with spondylitic changes and facet degenerative features at this level. No abnormal facet widening, jumped or perched facets. Skull base and vertebrae: No acute fracture. No primary bone lesion or focal pathologic process. Soft tissues and spinal canal: No pre or paravertebral fluid or swelling. No visible canal hematoma. Disc levels: Multilevel intervertebral disc height loss with spondylitic endplate changes. Additional mild uncinate spurring and minimal facet degenerative changes. No severe canal stenosis or foraminal narrowing. Upper chest: No acute abnormality in the upper chest or  imaged lung apices. Other: Normal thyroid. Cervical carotid atherosclerosis. IMPRESSION: 1. No acute intracranial abnormality. No scalp swelling or calvarial fracture. 2. Stable encephalomalacia in the bilateral frontal lobes, insula and right caudate. 3. Background of moderate chronic microvascular angiopathy changes and moderate parenchymal volume loss. 4. No acute fracture or subluxation of the cervical spine. 5. Multilevel degenerative disc disease and facet degenerative changes of the cervical spine. 6. Intracranial and cervical atherosclerosis. Electronically Signed   By: Lovena Le M.D.   On: 05/27/2019 21:35   DG HIP UNILAT WITH PELVIS 2-3 VIEWS RIGHT  Result Date: 05/27/2019 CLINICAL DATA:  Fall with hip pain EXAM: DG HIP (WITH OR WITHOUT PELVIS) 2-3V RIGHT COMPARISON:  CT 08/17/2017 FINDINGS: Linear object projects over the right iliac bone. The SI joints are non widened. The pubic symphysis and rami are intact. Both femoral heads project in joint. No fracture or malalignment. Vascular calcifications. IMPRESSION: 1. No acute osseous abnormality 2. Linear object overlying the right iliac bone, either external artifact or potentially foreign body. Electronically Signed   By: Donavan Foil M.D.   On: 05/27/2019 21:43    Microbiology: Recent Results (from the past 240 hour(s))  SARS CORONAVIRUS 2 (TAT 6-24 HRS) Nasopharyngeal Nasopharyngeal Swab     Status: None   Collection Time: 05/28/19 11:09 AM   Specimen: Nasopharyngeal Swab  Result Value Ref Range Status   SARS Coronavirus 2 NEGATIVE NEGATIVE Final    Comment: (NOTE) SARS-CoV-2 target nucleic acids are NOT DETECTED. The SARS-CoV-2 RNA is generally detectable in upper and lower respiratory specimens during the acute phase of infection. Negative results do not preclude SARS-CoV-2 infection, do not rule out co-infections with other pathogens, and should not be used as the sole basis for treatment or other patient management  decisions. Negative results must be combined with clinical observations, patient history, and epidemiological information. The expected result is Negative.  Fact Sheet for Patients: SugarRoll.be Fact Sheet for Healthcare Providers: https://www.woods-mathews.com/ This test is not yet approved or cleared by the [place] FDA and  has been authorized for detection and/or diagnosis of SARS-CoV-2 by FDA under an Emergency Use Authorization (EUA). This EUA will remain  in effect (meaning this test can be used) for the duration of the COVID-19 declaration under Section 56 4(b)(1) of the Act, 21 U.S.C. section 360bbb-3(b)(1), unless the authorization is terminated or revoked sooner. Performed at [facility], [place] [address], [place], [place] [address]      Labs: CBC: Recent Labs  Lab 05/27/19 1759 05/28/19 0300 05/29/19 0737  WBC 7.0 5.8 6.2  NEUTROABS  --   --  3.8  HGB 10.0* 9.0* 9.5*  HCT 31.4* 27.4* 29.8*  MCV 90.2 88.7 89.0  PLT 197 176 [id number]   Basic Metabolic Panel: Recent Labs  Lab 05/27/19 2104 05/28/19 0220 05/28/19 0300 05/28/19 0945 05/28/19 1858 05/28/19 2220 05/29/19 0737  NA 129*   < > 131* 136 136 134* 137  K 2.3*   < > 2.5* 2.9* 3.5 3.6 3.4*  CL 93*   < > 94* 98 99 99 100  CO2 26   < > 29 28 29 26 29   GLUCOSE 781*   < > 671* 271* 217* 264* 291*  BUN 11   < > 11 12 6 6 8   CREATININE 3.38*   < > 3.28* 3.39* 1.79* 1.97* 2.42*  CALCIUM 7.3*   < > 7.4* 8.0* 7.9* 7.8* 7.8*  MG 1.6*  --   --   --   --   --   --   PHOS 2.3*  --   --   --   --   --   --    < > = values in this interval not displayed.   Liver Function Tests: No results for input(s): AST, ALT, ALKPHOS, BILITOT, PROT, ALBUMIN in the last 168 hours. No results for input(s): LIPASE, AMYLASE in the last 168 hours. No results for input(s): AMMONIA in the last 168 hours. Cardiac Enzymes: No results for input(s): CKTOTAL, CKMB, CKMBINDEX, TROPONINI in  the last 168 hours. BNP (last 3 results) No results for input(s): BNP in the last 8760 hours. CBG: Recent Labs  Lab 05/28/19 0812 05/28/19 1806 05/28/19 2156 05/29/19 0948 05/29/19 1146  GLUCAP 417* 153* 241* 303* 230*    Time spent: 35 minutes  Signed:  Berle Mull  [facility] 05/29/2019 6:00 PM

## 2019-05-29 NOTE — Progress Notes (Signed)
[facility] Progress Note  [name]    [id number]  DOA: 05/27/2019     Date of Service: the patient was seen and examined on 05/28/2019  Chief Complaint  Patient presents with  . Abnormal labs  . Weakness   Brief hospital course: Presents with generalized weakness found to have hypokalemia as well as hyperglycemia Currently further plan is aggressive replacement of potassium.  Assessment and Plan: Hypokalemia Aggressively treated. After HD potassium is actually remaining stable without any intervention. Discharge home with follow-up with PCP with repeat BMP   Hyperglycemia in the setting of insulin dependent Type 2 diabetes Uncontrolled diabetes. A1c significantly elevated. We will continue current regimen on discharge for now.  Pseudonatremia Corrected to 140 due to hyperglycemia  Posterior right 8th rib fracture s/p fall PRN pain management   Recurrent falls Negative orthostatics  PT/OT recommends rolling walker and home health which is arranged  ESRD Tu/Thur/Sat Appreciate nephrology assistance with dialysis.  Hypertension Continue amlodipine and metoprolol  Blood pressure not well controlled.  Will increase the dose of the Norvasc on discharge.  GERD Continue PPI  Depression  Pt endorse multiple family members passing within the past few months. Denies suicidal ideations Continue sertraline  Pain control from rib cage pain due to rib fracture - [facility] Controlled Substance Reporting System database was reviewed. - 5 day supply was provided. - Patient was instructed, not to drive, operate heavy machinery, perform activities at heights, swimming or participation in water activities or provide baby sitting services while on Pain, Sleep and Anxiety Medications; until his outpatient Physician has advised to do so again.  - Also recommended to not to take more than prescribed Pain, Sleep and Anxiety Medications.  Diet: Renal  diet DVT Prophylaxis: Subcutaneous Heparin    Advance goals of care discussion: Full code  Family Communication: no family was present at bedside, at the time of interview.   Disposition:  Pt is from home, admitted with hypokalemia, still has persistent hyperkalemia as well as hyperglycemia and reported diarrhea, which precludes a safe discharge. Discharge to home, when diarrhea improves and potassium and sugar improves.  Subjective: Denies any acute complaint no nausea no vomiting.  Continues to have diarrhea although unable to give [place] a sample.  No abdominal pain.  Minimal oral intake.  Physical Exam: General: Cachectic appearing patient, alert oriented to time, place, and person.  Appear in mild distress, affect appropriate Eyes: PERRL ENT: Oral Mucosa Clear, dry  Neck: no JVD,  Cardiovascular: S1 and S2 Present, no Murmur,  Respiratory: good respiratory effort, Bilateral Air entry equal and Decreased, no Crackles, no wheezes Abdomen: Bowel Sound present, Soft and no tenderness,  Skin: no rash Extremities: no Pedal edema, no calf tenderness Neurologic: without any new focal findings generalized weakness Gait not checked due to patient safety concerns  Filed Weights   05/27/19 1750  Weight: 68 kg    Data Reviewed: I have personally reviewed and interpreted daily labs, tele strips, imagings as discussed above. I reviewed all nursing notes, pharmacy notes, vitals, pertinent old records I have discussed plan of care as described above with RN and patient/family.  CBC: Lab 05/27/19 1759 05/28/19 0300  WBC 7.0 5.8  NEUTROABS  --   --   HGB 10.0* 9.0*  HCT 31.4* 27.4*  MCV 90.2 88.7  PLT 197 0000000   Basic Metabolic Panel: Lab [id number] 05/28/19 0220 05/28/19 0300 05/28/19 0945 05/28/19 1858 05/28/19 2220  NA 129*   < >  131* 136 136 134*  K 2.3*   < > 2.5* 2.9* 3.5 3.6  CL 93*   < > 94* 98 99 99  CO2 26   < > 29 28 29 26   GLUCOSE 781*   < > 671* 271* 217* 264*   BUN 11   < > 11 12 6 6   CREATININE 3.38*   < > 3.28* 3.39* 1.79* 1.97*  CALCIUM 7.3*   < > 7.4* 8.0* 7.9* 7.8*  MG 1.6*  --   --   --   --   --   PHOS 2.3*  --   --   --   --   --     Studies: No results found.  Scheduled Meds: . amLODipine  5 mg Oral Daily  . Chlorhexidine Gluconate Cloth  6 each Topical Q0600  . [START ON 05/30/2019] epoetin (EPOGEN/PROCRIT) injection  10,000 Units Intravenous Q T,Th,Sa-HD  . folic acid  1 mg Oral Daily  . heparin  5,000 Units Subcutaneous Q8H  . insulin aspart  0-15 Units Subcutaneous TID WC  . insulin aspart  0-5 Units Subcutaneous QHS  . insulin glargine  15 Units Subcutaneous Daily  . metoprolol tartrate  50 mg Oral BID  . pantoprazole  40 mg Oral Daily  . sertraline  100 mg Oral Daily   Continuous Infusions: PRN Meds: acetaminophen  Time spent: 35 minutes  Author: Berle Mull, MD [facility] 05/28/2019 6:05 PM  To reach On-call, see care teams to locate the attending and reach out to them via [email or web address]. If 7PM-7AM, please contact night-coverage If you still have difficulty reaching the attending provider, please page the [facility] (Director on Call) for [facility] on amion for assistance.

## 2019-05-29 NOTE — Plan of Care (Signed)
The patient has been discharged. Educated upon discharged and teach back method utilized. No falls. IV removed. Vital signs stable at discharge.  Problem: Acute Rehab PT Goals(only PT should resolve) Goal: Pt Will Go Supine/Side To Sit Outcome: Completed/Met Goal: Patient Will Transfer Sit To/From Stand Outcome: Completed/Met Goal: Pt Will Ambulate Outcome: Completed/Met

## 2019-06-04 ENCOUNTER — Ambulatory Visit (INDEPENDENT_AMBULATORY_CARE_PROVIDER_SITE_OTHER): Payer: Medicare HMO | Admitting: Vascular Surgery

## 2019-06-04 ENCOUNTER — Encounter: Payer: Self-pay | Admitting: *Deleted

## 2019-06-04 ENCOUNTER — Emergency Department: Payer: Medicare Other

## 2019-06-04 ENCOUNTER — Other Ambulatory Visit (INDEPENDENT_AMBULATORY_CARE_PROVIDER_SITE_OTHER): Payer: Medicare Other

## 2019-06-04 ENCOUNTER — Other Ambulatory Visit: Payer: Self-pay

## 2019-06-04 ENCOUNTER — Encounter (INDEPENDENT_AMBULATORY_CARE_PROVIDER_SITE_OTHER): Payer: Medicare Other

## 2019-06-04 ENCOUNTER — Inpatient Hospital Stay
Admission: EM | Admit: 2019-06-04 | Discharge: 2019-06-09 | DRG: 193 | Disposition: A | Discharge status: Home Health | Payer: Medicare Other | Attending: Internal Medicine | Admitting: Internal Medicine

## 2019-06-04 DIAGNOSIS — F17210 Nicotine dependence, cigarettes, uncomplicated: Secondary | ICD-10-CM | POA: Diagnosis present

## 2019-06-04 DIAGNOSIS — Y95 Nosocomial condition: Secondary | ICD-10-CM | POA: Diagnosis present

## 2019-06-04 DIAGNOSIS — K922 Gastrointestinal hemorrhage, unspecified: Secondary | ICD-10-CM | POA: Diagnosis present

## 2019-06-04 DIAGNOSIS — N186 End stage renal disease: Secondary | ICD-10-CM | POA: Diagnosis present

## 2019-06-04 DIAGNOSIS — Z888 Allergy status to other drugs, medicaments and biological substances status: Secondary | ICD-10-CM | POA: Diagnosis not present

## 2019-06-04 DIAGNOSIS — E162 Hypoglycemia, unspecified: Secondary | ICD-10-CM | POA: Diagnosis present

## 2019-06-04 DIAGNOSIS — I959 Hypotension, unspecified: Secondary | ICD-10-CM | POA: Diagnosis present

## 2019-06-04 DIAGNOSIS — Z20822 Contact with and (suspected) exposure to covid-19: Secondary | ICD-10-CM | POA: Diagnosis present

## 2019-06-04 DIAGNOSIS — E8770 Fluid overload, unspecified: Secondary | ICD-10-CM | POA: Diagnosis present

## 2019-06-04 DIAGNOSIS — Z992 Dependence on renal dialysis: Secondary | ICD-10-CM

## 2019-06-04 DIAGNOSIS — N2581 Secondary hyperparathyroidism of renal origin: Secondary | ICD-10-CM | POA: Diagnosis present

## 2019-06-04 DIAGNOSIS — Z91048 Other nonmedicinal substance allergy status: Secondary | ICD-10-CM

## 2019-06-04 DIAGNOSIS — E1122 Type 2 diabetes mellitus with diabetic chronic kidney disease: Secondary | ICD-10-CM | POA: Diagnosis present

## 2019-06-04 DIAGNOSIS — Z841 Family history of disorders of kidney and ureter: Secondary | ICD-10-CM

## 2019-06-04 DIAGNOSIS — R7989 Other specified abnormal findings of blood chemistry: Secondary | ICD-10-CM | POA: Diagnosis present

## 2019-06-04 DIAGNOSIS — D649 Anemia, unspecified: Secondary | ICD-10-CM

## 2019-06-04 DIAGNOSIS — J189 Pneumonia, unspecified organism: Principal | ICD-10-CM | POA: Diagnosis present

## 2019-06-04 DIAGNOSIS — E785 Hyperlipidemia, unspecified: Secondary | ICD-10-CM | POA: Diagnosis present

## 2019-06-04 DIAGNOSIS — S2231XD Fracture of one rib, right side, subsequent encounter for fracture with routine healing: Secondary | ICD-10-CM | POA: Diagnosis not present

## 2019-06-04 DIAGNOSIS — R4182 Altered mental status, unspecified: Secondary | ICD-10-CM | POA: Diagnosis present

## 2019-06-04 DIAGNOSIS — E1100 Type 2 diabetes mellitus with hyperosmolarity without nonketotic hyperglycemic-hyperosmolar coma (NKHHC): Secondary | ICD-10-CM | POA: Diagnosis not present

## 2019-06-04 DIAGNOSIS — K219 Gastro-esophageal reflux disease without esophagitis: Secondary | ICD-10-CM | POA: Diagnosis present

## 2019-06-04 DIAGNOSIS — Z8249 Family history of ischemic heart disease and other diseases of the circulatory system: Secondary | ICD-10-CM

## 2019-06-04 DIAGNOSIS — Z8673 Personal history of transient ischemic attack (TIA), and cerebral infarction without residual deficits: Secondary | ICD-10-CM

## 2019-06-04 DIAGNOSIS — W19XXXD Unspecified fall, subsequent encounter: Secondary | ICD-10-CM | POA: Diagnosis present

## 2019-06-04 DIAGNOSIS — D631 Anemia in chronic kidney disease: Secondary | ICD-10-CM | POA: Diagnosis present

## 2019-06-04 DIAGNOSIS — E11649 Type 2 diabetes mellitus with hypoglycemia without coma: Secondary | ICD-10-CM | POA: Diagnosis not present

## 2019-06-04 DIAGNOSIS — E1165 Type 2 diabetes mellitus with hyperglycemia: Secondary | ICD-10-CM | POA: Diagnosis not present

## 2019-06-04 DIAGNOSIS — R296 Repeated falls: Secondary | ICD-10-CM | POA: Diagnosis present

## 2019-06-04 DIAGNOSIS — I10 Essential (primary) hypertension: Secondary | ICD-10-CM | POA: Diagnosis present

## 2019-06-04 DIAGNOSIS — E876 Hypokalemia: Secondary | ICD-10-CM | POA: Diagnosis not present

## 2019-06-04 DIAGNOSIS — Z794 Long term (current) use of insulin: Secondary | ICD-10-CM

## 2019-06-04 DIAGNOSIS — D62 Acute posthemorrhagic anemia: Secondary | ICD-10-CM | POA: Diagnosis present

## 2019-06-04 LAB — RESPIRATORY PANEL BY RT PCR (FLU A&B, COVID)
Influenza A by PCR: NEGATIVE
Influenza B by PCR: NEGATIVE
SARS Coronavirus 2 by RT PCR: NEGATIVE

## 2019-06-04 LAB — IRON AND TIBC
Iron: 8 ug/dL — ABNORMAL LOW (ref 45–182)
Saturation Ratios: 4 % — ABNORMAL LOW (ref 17.9–39.5)
TIBC: 185 ug/dL — ABNORMAL LOW (ref 250–450)
UIBC: 177 ug/dL

## 2019-06-04 LAB — CBC
HCT: 27.4 % — ABNORMAL LOW (ref 39.0–52.0)
Hemoglobin: 9.1 g/dL — ABNORMAL LOW (ref 13.0–17.0)
MCH: 29 pg (ref 26.0–34.0)
MCHC: 33.2 g/dL (ref 30.0–36.0)
MCV: 87.3 fL (ref 80.0–100.0)
Platelets: 151 10*3/uL (ref 150–400)
RBC: 3.14 MIL/uL — ABNORMAL LOW (ref 4.22–5.81)
RDW: 14.7 % (ref 11.5–15.5)
WBC: 21.1 10*3/uL — ABNORMAL HIGH (ref 4.0–10.5)
nRBC: 0 % (ref 0.0–0.2)

## 2019-06-04 LAB — COMPREHENSIVE METABOLIC PANEL
ALT: 17 U/L (ref 0–44)
AST: 38 U/L (ref 15–41)
Albumin: 2.3 g/dL — ABNORMAL LOW (ref 3.5–5.0)
Alkaline Phosphatase: 143 U/L — ABNORMAL HIGH (ref 38–126)
Anion gap: 10 (ref 5–15)
BUN: 33 mg/dL — ABNORMAL HIGH (ref 6–20)
CO2: 26 mmol/L (ref 22–32)
Calcium: 8.2 mg/dL — ABNORMAL LOW (ref 8.9–10.3)
Chloride: 95 mmol/L — ABNORMAL LOW (ref 98–111)
Creatinine, Ser: 3.38 mg/dL — ABNORMAL HIGH (ref 0.61–1.24)
GFR calc Af Amer: 22 mL/min — ABNORMAL LOW (ref >60)
GFR calc non Af Amer: 19 mL/min — ABNORMAL LOW (ref >60)
Glucose, Bld: 122 mg/dL — ABNORMAL HIGH (ref 70–99)
Potassium: 3.7 mmol/L (ref 3.5–5.1)
Sodium: 131 mmol/L — ABNORMAL LOW (ref 135–145)
Total Bilirubin: 0.8 mg/dL (ref 0.3–1.2)
Total Protein: 5.5 g/dL — ABNORMAL LOW (ref 6.5–8.1)

## 2019-06-04 LAB — LACTIC ACID, PLASMA
Lactic Acid, Venous: 2.5 mmol/L (ref 0.5–1.9)
Lactic Acid, Venous: 1.9 mmol/L (ref 0.5–1.9)

## 2019-06-04 LAB — ETHANOL: Alcohol, Ethyl (B): <10 mg/dL (ref <10)

## 2019-06-04 LAB — CBC WITH DIFFERENTIAL/PLATELET
Abs Immature Granulocytes: 0.35 10*3/uL — ABNORMAL HIGH (ref 0.00–0.07)
Basophils Absolute: 0 10*3/uL (ref 0.0–0.1)
Basophils Relative: 0 %
Eosinophils Absolute: 0 10*3/uL (ref 0.0–0.5)
Eosinophils Relative: 0 %
HCT: 22.8 % — ABNORMAL LOW (ref 39.0–52.0)
Hemoglobin: 7.3 g/dL — ABNORMAL LOW (ref 13.0–17.0)
Immature Granulocytes: 2 %
Lymphocytes Relative: 6 %
Lymphs Abs: 1 10*3/uL (ref 0.7–4.0)
MCH: 29.3 pg (ref 26.0–34.0)
MCHC: 32 g/dL (ref 30.0–36.0)
MCV: 91.6 fL (ref 80.0–100.0)
Monocytes Absolute: 0.9 10*3/uL (ref 0.1–1.0)
Monocytes Relative: 5 %
Neutro Abs: 14.6 10*3/uL — ABNORMAL HIGH (ref 1.7–7.7)
Neutrophils Relative %: 87 %
Platelets: 177 10*3/uL (ref 150–400)
RBC: 2.49 MIL/uL — ABNORMAL LOW (ref 4.22–5.81)
RDW: 13.5 % (ref 11.5–15.5)
WBC: 16.9 10*3/uL — ABNORMAL HIGH (ref 4.0–10.5)
nRBC: 0 % (ref 0.0–0.2)

## 2019-06-04 LAB — ACETAMINOPHEN LEVEL: Acetaminophen (Tylenol), Serum: <10 ug/mL — ABNORMAL LOW (ref 10–30)

## 2019-06-04 LAB — LIPASE, BLOOD: Lipase: 33 U/L (ref 11–51)

## 2019-06-04 LAB — MRSA PCR SCREENING: MRSA by PCR: NEGATIVE

## 2019-06-04 LAB — SALICYLATE LEVEL: Salicylate Lvl: <7 mg/dL — ABNORMAL LOW (ref 7.0–30.0)

## 2019-06-04 LAB — FOLATE: Folate: 21.4 ng/mL (ref >5.9)

## 2019-06-04 LAB — HIV ANTIBODY (ROUTINE TESTING W REFLEX): HIV Screen 4th Generation wRfx: NONREACTIVE

## 2019-06-04 LAB — TROPONIN I (HIGH SENSITIVITY)
Troponin I (High Sensitivity): 12 ng/L (ref <18)
Troponin I (High Sensitivity): 11 ng/L (ref <18)

## 2019-06-04 LAB — PREPARE RBC (CROSSMATCH)

## 2019-06-04 LAB — VITAMIN B12: Vitamin B-12: 1461 pg/mL — ABNORMAL HIGH (ref 180–914)

## 2019-06-04 MED ORDER — SODIUM CHLORIDE 0.9 % IV SOLN
10.0000 mL/h | Freq: Once | INTRAVENOUS | Status: AC
  Administered 2019-06-04: 10 mL/h via INTRAVENOUS

## 2019-06-04 MED ORDER — PIPERACILLIN-TAZOBACTAM 3.375 G IVPB 30 MIN
3.3750 g | Freq: Once | INTRAVENOUS | Status: AC
  Administered 2019-06-04: 3.375 g via INTRAVENOUS
  Filled 2019-06-04: qty 50

## 2019-06-04 MED ORDER — HEPARIN SODIUM (PORCINE) 5000 UNIT/ML IJ SOLN
5000.0000 [IU] | Freq: Three times a day (TID) | INTRAMUSCULAR | Status: DC
  Administered 2019-06-04 – 2019-06-09 (×14): 5000 [IU] via SUBCUTANEOUS
  Filled 2019-06-04 (×14): qty 1

## 2019-06-04 MED ORDER — SODIUM CHLORIDE 0.9% IV SOLUTION
Freq: Once | INTRAVENOUS | Status: DC
  Filled 2019-06-04: qty 250

## 2019-06-04 MED ORDER — SODIUM CHLORIDE 0.9 % IV SOLN
500.0000 mg | INTRAVENOUS | Status: DC
  Administered 2019-06-04 – 2019-06-06 (×3): 500 mg via INTRAVENOUS
  Filled 2019-06-04 (×4): qty 500

## 2019-06-04 MED ORDER — CHLORHEXIDINE GLUCONATE CLOTH 2 % EX PADS
6.0000 | MEDICATED_PAD | Freq: Every day | CUTANEOUS | Status: DC
  Administered 2019-06-05 – 2019-06-07 (×3): 6 via TOPICAL
  Filled 2019-06-04: qty 6

## 2019-06-04 MED ORDER — HEPARIN SODIUM (PORCINE) 1000 UNIT/ML DIALYSIS: 20.0000 [IU]/kg | INTRAMUSCULAR | Status: DC | PRN

## 2019-06-04 MED ORDER — SODIUM CHLORIDE 0.9 % IV SOLN
2.0000 g | INTRAVENOUS | Status: DC
  Administered 2019-06-04 – 2019-06-06 (×3): 2 g via INTRAVENOUS
  Filled 2019-06-04 (×2): qty 2
  Filled 2019-06-04: qty 20

## 2019-06-04 MED ORDER — VANCOMYCIN HCL IN DEXTROSE 1-5 GM/200ML-% IV SOLN
1000.0000 mg | Freq: Once | INTRAVENOUS | Status: AC
  Administered 2019-06-04: 1000 mg via INTRAVENOUS
  Filled 2019-06-04: qty 200

## 2019-06-04 NOTE — ED Notes (Signed)
Dialysis notified that patient's blood transfusion is nearing completion and he is ready for transport to dialysis. Staff also notified to please collect CBC and HIV still requiring collection 2/2 patient difficult stick.

## 2019-06-04 NOTE — ED Notes (Signed)
Blood cultures drawn post zosyn infusion. Pt difficult stick. MD in know and antibiotics not held.

## 2019-06-04 NOTE — Progress Notes (Signed)
Established hemodialysis patient known at [facility] ([address]) TTS 6:00. Patient rides with [facility]. Patient has no dialysis concerns this admission. Spoke with wife, she is stated concerns about patient memory and being left at home alone while at work. This person advised wife to discuss this concern with the admitting doctor. Please contact me with any dialysis discharge placement concerns.  Elvera Bicker Dialysis Coordinator [phone]

## 2019-06-04 NOTE — Progress Notes (Signed)
Pre HD Tx Assessment   06/04/19 1135  Neurological  Level of Consciousness Alert  Orientation Level Oriented to person;Oriented to place;Oriented to situation  Respiratory  Respiratory Pattern Regular;Unlabored  Chest Assessment Chest expansion symmetrical  Bilateral Breath Sounds Clear;Diminished  Cardiac  Pulse Regular  Heart Sounds S1, S2  Jugular Venous Distention (JVD) No  ECG Monitor Yes  Cardiac Rhythm Atrial fibrillation  Antiarrhythmic device No  Vascular  R Radial Pulse +2  L Radial Pulse +2  Integumentary  Integumentary (WDL) WDL  Musculoskeletal  Musculoskeletal (WDL) X  Generalized Weakness Yes  Gastrointestinal  Bowel Sounds Assessment Hypoactive  Last BM Date 05/29/19  GU Assessment  Genitourinary (WDL) X  Genitourinary Symptoms Oliguria (HD PT)  Psychosocial  Psychosocial (WDL) WDL

## 2019-06-04 NOTE — Progress Notes (Signed)
Patient was seen in the emergency room this morning.  He is medically stable.  Blood transfusion was ongoing at the time of my visit.  Consulted gastroenterologist for heme positive stools and acute drop in hemoglobin/anemia likely from blood loss.  Plan for hemodialysis later today.  It is unclear whether infiltrates on chest x-ray is solely due to pulmonary edema/fluid overload.  Repeat chest x-ray tomorrow to see whether there will be any changes after dialysis.  Continue current treatment.

## 2019-06-04 NOTE — Progress Notes (Signed)
Pre HD Tx Note  Pt arrived from ED on a chair. Pt is A*3 just missed the date, Pt is on RA SPO2 95%. BP WDL to start Tx. Pt received 1 unit of blood as per ED Primary RN. CVC WDL Tx should run 3.5hrs on 3K2.5CA  06/04/19 1130  Hand-Off documentation  Report given to (Full Name) Newt Minion RN   Report received from (Full Name) Tressia Danas RN   Vital Signs  Temp 97.8 F (36.6 C)  Temp Source Oral  Pulse Rate 70  Pulse Rate Source Monitor  Resp 20  BP (!) 167/81  BP Location Left Arm  BP Method Automatic  Patient Position (if appropriate) Lying  Oxygen Therapy  SpO2 97 %  O2 Device Room Air  Pain Assessment  Pain Scale 0-10  Pain Score 0  Dialysis Weight  Weight 75 kg  Type of Weight Pre-Dialysis  Time-Out for Hemodialysis  What Procedure? HD  Pt Identifiers(min of two) First/Last Name;MRN/Account#  Correct Site? Yes  Correct Side? Yes  Correct Procedure? Yes  Consents Verified? Yes  Safety Precautions Reviewed? Yes  Engineer, civil (consulting) Number 5  Station Number 4  UF/Alarm Test Passed  Conductivity: Meter 14  Conductivity: Machine  13.8  pH 7.2  Reverse Osmosis Main  Normal Saline Lot Number [id number]  Dialyzer Lot Number [id number]  Disposable Set Lot Number [id number]  Machine Temperature 98.6 F (37 C)  Musician and Audible Yes  Blood Lines Intact and Secured Yes  Pre Treatment Patient Checks  HD catheter dressing before treatment WDL  Patient is receiving dialysis in a chair Yes  Hepatitis B Surface Antigen Results Negative  Date Hepatitis B Surface Antigen Drawn 05/26/19  Hepatitis B Surface Antibody  (Immune )  Date Hepatitis B Surface Antibody Drawn 04/13/19  Hemodialysis Consent Verified Yes  Hemodialysis Standing Orders Initiated Yes  ECG (Telemetry) Monitor On Yes  Prime Ordered Normal Saline  Length of  DialysisTreatment -hour(s) 3.5 Hour(s)  Dialysis Treatment Comments  (Na140)  Dialyzer Elisio 17H NR  Dialysis Anticoagulant None   Dialysate Flow Ordered 600  Blood Flow Rate Ordered 400 mL/min  Ultrafiltration Goal 1 Liters  Dialysis Blood Pressure Support Ordered Albumin  Education / Care Plan  Dialysis Education Provided Yes  Documented Education in Care Plan Yes  Hemodialysis Catheter Right Internal jugular Double lumen Permanent (Tunneled)  Placement Date/Time: 04/10/19 1440   Placed prior to admission: Yes  Time Out: Correct patient;Correct site;Correct procedure  Maximum sterile barrier precautions: Hand hygiene;Cap;Sterile gown;Mask;Sterile gloves;Large sterile sheet  Site Prep: Chlor...  Site Condition No complications  Blue Lumen Status Infusing;Flushed;Blood return noted  Red Lumen Status Infusing;Flushed;Blood return noted  Purple Lumen Status N/A  Catheter fill solution Heparin 1000 units/ml  Dressing Type Gauze/Drain sponge  Dressing Status Clean;Dry;Intact  Drainage Description None

## 2019-06-04 NOTE — ED Notes (Signed)
Inpatient provider at bedside.

## 2019-06-04 NOTE — ED Notes (Signed)
Pt transported to CT at this time.

## 2019-06-04 NOTE — Progress Notes (Signed)
HD Tx Completed    06/04/19 1520  Vital Signs  Pulse Rate 66  BP (!) 160/81  Oxygen Therapy  SpO2 100 %  O2 Device Nasal Cannula  O2 Flow Rate (L/min) 2 L/min  During Hemodialysis Assessment  Blood Flow Rate (mL/min) 400 mL/min  Arterial Pressure (mmHg) -130 mmHg  Venous Pressure (mmHg) 170 mmHg  Transmembrane Pressure (mmHg) 50 mmHg  Ultrafiltration Rate (mL/min) 430 mL/min  Dialysate Flow Rate (mL/min) 600 ml/min  Conductivity: Machine  14.1  HD Safety Checks Performed Yes  Intra-Hemodialysis Comments Tx completed;Tolerated well

## 2019-06-04 NOTE — Progress Notes (Signed)
Post HD Tx Assessment   06/04/19 1535  Neurological  Level of Consciousness Alert  Orientation Level Oriented to person;Oriented to place;Oriented to situation  Respiratory  Respiratory Pattern Regular;Unlabored  Chest Assessment Chest expansion symmetrical  Bilateral Breath Sounds Clear;Diminished  Cardiac  Pulse Regular  Heart Sounds S1, S2  Jugular Venous Distention (JVD) No  ECG Monitor Yes  Cardiac Rhythm Atrial fibrillation  Antiarrhythmic device No  Vascular  R Radial Pulse +2  L Radial Pulse +2  Integumentary  Integumentary (WDL) WDL  Musculoskeletal  Musculoskeletal (WDL) X  Generalized Weakness Yes  Gastrointestinal  Bowel Sounds Assessment Hypoactive  Last BM Date 05/29/19  GU Assessment  Genitourinary (WDL) X  Genitourinary Symptoms Oliguria (HD PT)  Psychosocial  Psychosocial (WDL) WDL

## 2019-06-04 NOTE — Progress Notes (Signed)
HD Tx Started    06/04/19 1145  Vital Signs  Pulse Rate 70  Pulse Rate Source Monitor  Resp 20  BP (!) 165/85  Oxygen Therapy  SpO2 94 %  O2 Device Room Air  During Hemodialysis Assessment  Blood Flow Rate (mL/min) 400 mL/min  Arterial Pressure (mmHg) -150 mmHg  Venous Pressure (mmHg) 130 mmHg  Transmembrane Pressure (mmHg) 50 mmHg  Ultrafiltration Rate (mL/min) 430 mL/min  Dialysate Flow Rate (mL/min) 600 ml/min  Conductivity: Machine  13.9  HD Safety Checks Performed Yes  Dialysis Fluid Bolus Normal Saline  Bolus Amount (mL) 250 mL  Intra-Hemodialysis Comments Tx initiated

## 2019-06-04 NOTE — Progress Notes (Signed)
PHARMACY -  BRIEF ANTIBIOTIC NOTE   Pharmacy has received consult(s) for Vancomycin from an ED provider.  The patient's profile has been reviewed for ht/wt/allergies/indication/available labs.    One time order(s) placed for Vancomycin 1gm  Further antibiotics/pharmacy consults should be ordered by admitting physician if indicated.                       Thank you, Hart Robinsons A 06/04/2019  4:11 AM

## 2019-06-04 NOTE — Progress Notes (Signed)
[place], [place] 06/04/19  Subjective:   Hospital day # 1  Patient known to our practice from outpatient dialysis and previous admissions This time admitted for concerns of recurrent falls Patient reports poor oral intake at home   HEMODIALYSIS FLOWSHEET:  Blood Flow Rate (mL/min): 400 mL/min Arterial Pressure (mmHg): -130 mmHg Venous Pressure (mmHg): 170 mmHg Transmembrane Pressure (mmHg): 50 mmHg Ultrafiltration Rate (mL/min): 430 mL/min Dialysate Flow Rate (mL/min): 600 ml/min Conductivity: Machine : 14.1 Conductivity: Machine : 14.1 Dialysis Fluid Bolus: Normal Saline Bolus Amount (mL): 250 mL    Renal: 01/20 0701 - 01/21 0700 In: 340 [Blood:340] Out: -  Lab Results  Component Value Date   CREATININE 3.38 (H) 06/04/2019   CREATININE 2.42 (H) 05/29/2019   CREATININE 1.97 (H) 05/28/2019     Objective:  Vital signs in last 24 hours:  Temp:  [97.4 F (36.3 C)-98.2 F (36.8 C)] 98.1 F (36.7 C) (01/21 1530) Pulse Rate:  [66-82] 66 (01/21 1633) Resp:  [15-20] 16 (01/21 1633) BP: (105-188)/(68-93) 165/80 (01/21 1633) SpO2:  [92 %-100 %] 96 % (01/21 1633) Weight:  [6.804 kg-75 kg] 75 kg (01/21 1200)  Weight change:  Filed Weights   06/04/19 0200 06/04/19 1130 06/04/19 1200  Weight: 74.8 kg 75 kg 75 kg    Intake/Output:    Intake/Output Summary (Last 24 hours) at 06/04/2019 1723 Last data filed at 06/04/2019 1530 Gross per 24 hour  Intake 340 ml  Output 1000 ml  Net -660 ml     Physical Exam: General:  Thin, no acute distress  HEENT  moist oral mucous membranes  Pulm/lungs  clear to auscultation  CVS/Heart  irregular rhythm, no rub  Abdomen:   Soft, nontender  Extremities:  No peripheral edema  Neurologic:  Alert, oriented to self, able to answer few questions  Skin:  No acute rashes  Access:  Right IJ PermCath       Basic Metabolic Panel:  Recent Labs  Lab 05/28/19 1858 05/28/19 1858 05/28/19 2220  05/29/19 0737 06/04/19 0138  NA 136  --  134* 137 131*  K 3.5  --  3.6 3.4* 3.7  CL 99  --  99 100 95*  CO2 29  --  26 29 26   GLUCOSE 217*  --  264* 291* 122*  BUN 6  --  6 8 33*  CREATININE 1.79*  --  1.97* 2.42* 3.38*  CALCIUM 7.9*   < > 7.8* 7.8* 8.2*   < > = values in this interval not displayed.     CBC: Recent Labs  Lab 05/29/19 0737 06/04/19 0138 06/04/19 1515  WBC 6.2 16.9* 21.1*  NEUTROABS 3.8 14.6*  --   HGB 9.5* 7.3* 9.1*  HCT 29.8* 22.8* 27.4*  MCV 89.0 91.6 87.3  PLT 173 177 151      Lab Results  Component Value Date   HEPBSAG NON REACTIVE 04/11/2019   HEPBSAB NON REACTIVE 04/10/2019      Microbiology:  Recent Results (from the past 240 hour(s))  SARS CORONAVIRUS 2 (TAT 6-24 HRS) Nasopharyngeal Nasopharyngeal Swab     Status: None   Collection Time: 05/28/19 11:09 AM   Specimen: Nasopharyngeal Swab  Result Value Ref Range Status   SARS Coronavirus 2 NEGATIVE NEGATIVE Final    Comment: (NOTE) SARS-CoV-2 target nucleic acids are NOT DETECTED. The SARS-CoV-2 RNA is generally detectable in upper and lower respiratory specimens during the acute phase of infection. Negative results do not preclude SARS-CoV-2 infection, do  not rule out co-infections with other pathogens, and should not be used as the sole basis for treatment or other patient management decisions. Negative results must be combined with clinical observations, patient history, and epidemiological information. The expected result is Negative. Fact Sheet for Patients: SugarRoll.be Fact Sheet for Healthcare Providers: https://www.woods-mathews.com/ This test is not yet approved or cleared by the [place] FDA and  has been authorized for detection and/or diagnosis of SARS-CoV-2 by FDA under an Emergency Use Authorization (EUA). This EUA will remain  in effect (meaning this test can be used) for the duration of the COVID-19 declaration under  Section 56 4(b)(1) of the Act, 21 U.S.C. section 360bbb-3(b)(1), unless the authorization is terminated or revoked sooner. Performed at [facility], [place] [address], [place], [place] [address]   Respiratory Panel by RT PCR (Flu A&B, Covid) - Nasopharyngeal Swab     Status: None   Collection Time: 06/04/19  1:44 AM   Specimen: Nasopharyngeal Swab  Result Value Ref Range Status   SARS Coronavirus 2 by RT PCR NEGATIVE NEGATIVE Final    Comment: (NOTE) SARS-CoV-2 target nucleic acids are NOT DETECTED. The SARS-CoV-2 RNA is generally detectable in upper respiratoy specimens during the acute phase of infection. The lowest concentration of SARS-CoV-2 viral copies this assay can detect is 131 copies/mL. A negative result does not preclude SARS-Cov-2 infection and should not be used as the sole basis for treatment or other patient management decisions. A negative result may occur with  improper specimen collection/handling, submission of specimen other than nasopharyngeal swab, presence of viral mutation(s) within the areas targeted by this assay, and inadequate number of viral copies (<131 copies/mL). A negative result must be combined with clinical observations, patient history, and epidemiological information. The expected result is Negative. Fact Sheet for Patients:  PinkCheek.be Fact Sheet for Healthcare Providers:  GravelBags.it This test is not yet ap proved or cleared by the [place] FDA and  has been authorized for detection and/or diagnosis of SARS-CoV-2 by FDA under an Emergency Use Authorization (EUA). This EUA will remain  in effect (meaning this test can be used) for the duration of the COVID-19 declaration under Section 564(b)(1) of the Act, 21 U.S.C. section 360bbb-3(b)(1), unless the authorization is terminated or revoked sooner.    Influenza A by PCR NEGATIVE NEGATIVE Final   Influenza B by PCR NEGATIVE  NEGATIVE Final    Comment: (NOTE) The Xpert Xpress SARS-CoV-2/FLU/RSV assay is intended as an aid in  the diagnosis of influenza from Nasopharyngeal swab specimens and  should not be used as a sole basis for treatment. Nasal washings and  aspirates are unacceptable for Xpert Xpress SARS-CoV-2/FLU/RSV  testing. Fact Sheet for Patients: PinkCheek.be Fact Sheet for Healthcare Providers: GravelBags.it This test is not yet approved or cleared by the [place] FDA and  has been authorized for detection and/or diagnosis of SARS-CoV-2 by  FDA under an Emergency Use Authorization (EUA). This EUA will remain  in effect (meaning this test can be used) for the duration of the  Covid-19 declaration under Section 564(b)(1) of the Act, 21  U.S.C. section 360bbb-3(b)(1), unless the authorization is  terminated or revoked. Performed at [facility], [address] [place], [place] [address]   Culture, blood (routine x 2)     Status: None (Preliminary result)   Collection Time: 06/04/19  3:39 AM   Specimen: BLOOD  Result Value Ref Range Status   Specimen Description BLOOD RIGHT [facility]  Final  Special Requests   Final    BOTTLES DRAWN AEROBIC AND ANAEROBIC Blood Culture results may not be optimal due to an inadequate volume of blood received in culture bottles   Culture   Final    NO GROWTH < 12 HOURS Performed at [facility], [address] [place], [place] [address]    Report Status PENDING  Incomplete  Culture, blood (routine x 2)     Status: None (Preliminary result)   Collection Time: 06/04/19  3:39 AM   Specimen: BLOOD  Result Value Ref Range Status   Specimen Description BLOOD LEFT FA  Final   Special Requests   Final    BOTTLES DRAWN AEROBIC AND ANAEROBIC Blood Culture adequate volume   Culture   Final    NO GROWTH < 12 HOURS Performed at [facility], [address] [place], [place] [address]     Report Status PENDING  Incomplete    Coagulation Studies: No results for input(s): LABPROT, INR in the last 72 hours.  Urinalysis: No results for input(s): COLORURINE, LABSPEC, PHURINE, GLUCOSEU, HGBUR, BILIRUBINUR, KETONESUR, PROTEINUR, UROBILINOGEN, NITRITE, LEUKOCYTESUR in the last 72 hours.  Invalid input(s): APPERANCEUR    Imaging: CT Head Wo Contrast  Result Date: 06/04/2019 CLINICAL DATA:  Encephalopathy EXAM: CT HEAD WITHOUT CONTRAST TECHNIQUE: Contiguous axial images were obtained from the base of the skull through the vertex without intravenous contrast. COMPARISON:  05/27/2019 FINDINGS: Brain: Unchanged area of encephalomalacia in the left frontal lobe. No intracranial hemorrhage or extra-axial collection. There is periventricular hypoattenuation compatible with chronic microvascular disease. Mild generalized volume loss. Old right caudate head infarct. Vascular: Atherosclerotic calcification of the internal carotid arteries at the skull base. Skull: Normal Sinuses/Orbits: Paranasal sinuses clear aside from chronic opacification within the left frontal sinus. Mastoids free of fluid. The orbits are normal. Other: None IMPRESSION: 1. No acute intracranial abnormality. 2. Old left frontal lobe infarct and findings of chronic ischemic microangiopathy. Electronically Signed   By: Ulyses Jarred M.D.   On: 06/04/2019 03:11   DG Chest Port 1 View  Result Date: 06/04/2019 CLINICAL DATA:  Weakness. Altered mentation. EXAM: PORTABLE CHEST 1 VIEW COMPARISON:  Radiograph and CT 05/27/2019 FINDINGS: Right dialysis catheter tip in the atrial caval junction. Lower lung volumes from prior exam. Cardiomegaly with equivocal increase. No interstitial and bronchial thickening. Small right pleural effusion no focal airspace disease. No pneumothorax. Subacute right lower rib fractures. IMPRESSION: 1. Lower lung volumes from prior exam. Cardiomegaly with interstitial and bronchial thickening, suspect fluid  overload. 2. Small right pleural effusion. Electronically Signed   By: Keith Rake M.D.   On: 06/04/2019 02:02     Medications:   . azithromycin Stopped (06/04/19 0800)  . cefTRIAXone (ROCEPHIN)  IV Stopped (06/04/19 [id number])   . Chlorhexidine Gluconate Cloth  6 each Topical Q0600  . heparin  5,000 Units Subcutaneous Q8H     Assessment/ Plan:  59 y.o. male with type 2 diabetes, hypertension admitted on 06/04/2019 for Acute blood loss anemia [D62] HCAP (healthcare-associated pneumonia) [J18.9] Elevated lactic acid level [R79.89] Altered mental status, unspecified altered mental status type [R41.82] Gastrointestinal hemorrhage, unspecified gastrointestinal hemorrhage type [K92.2]  [facility] TTS [address] RIJ permcath   1.  End-stage renal disease Continue scheduled dialysis on Tuesday, Thursday, Saturday schedule Patient seen during dialysis Tolerating well  2.  Anemia of chronic kidney disease Continue Epogen with dialysis Lab Results  Component Value Date   HGB 9.1 (L) 06/04/2019  Presenting hemoglobin 7.3.  Received blood transfusion.  3.  Secondary hyperparathyroidism Lab Results  Component Value Date   CALCIUM 8.2 (L) 06/04/2019   PHOS 2.3 (L) 05/27/2019  Low phosphorus, likely due to poor oral intake  4.  Currently being treated empirically for healthcare associated pneumonia with azithromycin and ceftriaxone      LOS: 1 Nusaybah Ivie 1/21/20215:23 PM  [place], [place]  Note: This note was prepared with Dragon dictation. Any transcription errors are unintentional

## 2019-06-04 NOTE — ED Triage Notes (Signed)
Pt brought in by ems from home.  Ems report frequent falls recently.  Pt doesn't follow verbal commands.  Dialysis pt x 1 month.  Pt alert.

## 2019-06-04 NOTE — H&P (Signed)
History and Physical    SALIF SERVELLON [id number] DOB: 19-Jan-1961 DOA: 06/04/2019  PCP: [name], MD   Patient coming from: home  I have personally briefly reviewed patient's old medical records in [place]  Chief Complaint: fall  HPI: [name] is a 59 y.o. male with medical history significant for  ESRD Tu/Thurs/Sat, Type 2 diabetes,ANA positive, h/o R MCA CVA w chronic bilateral ICA occlusion, hypertension and hyperlipidemia who presents with concerns ofrecurrent fall.  Patient was recently hospitalized from 05/27/2019 to 05/29/2019 with hypokalemia and generalized weakness.  Since discharge she has had decreased oral intake.  ED Course: In the emergency room he was borderline hypotensive with blood pressure 105/68 with otherwise unremarkable vitals.  Blood work significant for hemoglobin of 7.3 which is down from 9.51-week prior.  Stool for occult blood was positive.  Other blood work notable for a white cell count of 16,000 and lactic acid of 2.5.  Showed no acute intracranial findings.  Chest x-ray showed cardiomegaly with interstitial and bronchial thickening suspect fluid overload as well as a small right pleural effusion.  Covid test negative.  Troponin was normal.  Patient was started on IV Zosyn and vancomycin and transfusion was started with 1 unit of blood.  Hospitalist consulted for admission  Review of Systems: As per HPI otherwise 10 point review of systems negative.    Past Medical History:  Diagnosis Date  . Chronic kidney disease   . Diabetes mellitus without complication ([place])   . ETOH abuse   . Hyperlipidemia   . Hypertension   . Stroke [facility])     Past Surgical History:  Procedure Laterality Date  . COLONOSCOPY WITH PROPOFOL N/A 03/06/2019   Procedure: COLONOSCOPY WITH PROPOFOL;  Surgeon: Lucilla Lame, MD;  Location: [facility] ENDOSCOPY;  Service: Endoscopy;  Laterality: N/A;  . DIALYSIS/PERMA CATHETER INSERTION N/A 04/10/2019   Procedure:  DIALYSIS/PERMA CATHETER INSERTION;  Surgeon: Serafina Mitchell, MD;  Location: [facility] CV LAB;  Service: Cardiovascular;  Laterality: N/A;  . ESOPHAGOGASTRODUODENOSCOPY (EGD) WITH PROPOFOL N/A 07/01/2016   Procedure: ESOPHAGOGASTRODUODENOSCOPY (EGD) WITH PROPOFOL;  Surgeon: San Jetty, MD;  Location: [facility] ENDOSCOPY;  Service: General;  Laterality: N/A;     reports that he has been smoking cigars. He has a 1.25 pack-year smoking history. He has never used smokeless tobacco. He reports current alcohol use of about 16.0 standard drinks of alcohol per week. He reports that he does not use drugs.  Allergies  Allergen Reactions  . Ferrous Gluconate Nausea And Vomiting    Family History  Problem Relation Age of Onset  . CAD Brother   . Dementia Mother   . Renal Disease Father      Prior to Admission medications   Medication Sig Start Date End Date Taking? Authorizing Provider  amLODipine (NORVASC) 5 MG tablet Take 2 tablets (10 mg total) by mouth daily. 05/29/19  Yes Lavina Hamman, MD  folic acid (FOLVITE) 1 MG tablet Take 1 mg by mouth daily. 03/12/19  Yes [provider]  hydrocortisone 2.5 % cream Apply 1 application topically 2 (two) times daily as needed (rash).  04/08/19  Yes [provider]  insulin glargine (LANTUS) 100 UNIT/ML injection Inject 0.15 mLs (15 Units total) into the skin daily. Patient taking differently: Inject 15 Units into the skin daily as needed (elevated blood sugar).  07/27/18  Yes Mody, Ulice Bold, MD  Melatonin 3 MG TABS Take 1 tablet by mouth at bedtime. 05/23/19  Yes [provider]  metoprolol tartrate (LOPRESSOR) 50 MG tablet Take 50 mg by mouth 2 (two) times daily. 02/16/19 02/16/20 Yes [provider]  pantoprazole (PROTONIX) 40 MG tablet Take 40 mg by mouth daily. 12/18/18  Yes [provider]  sertraline (ZOLOFT) 100 MG tablet Take 100 mg by mouth daily. 04/08/19  Yes [provider]    Physical  Exam: Vitals:   06/04/19 0136 06/04/19 0200 06/04/19 0347 06/04/19 0406  BP: 105/68  131/74 124/73  Pulse: 82  73 77  Resp: 18  17 17   Temp: 98 F (36.7 C)  98 F (36.7 C) 98 F (36.7 C)  TempSrc: Axillary  Axillary Axillary  SpO2: 96%  98% 100%  Weight:  74.8 kg    Height:         Vitals:   06/04/19 0136 06/04/19 0200 06/04/19 0347 06/04/19 0406  BP: 105/68  131/74 124/73  Pulse: 82  73 77  Resp: 18  17 17   Temp: 98 F (36.7 C)  98 F (36.7 C) 98 F (36.7 C)  TempSrc: Axillary  Axillary Axillary  SpO2: 96%  98% 100%  Weight:  74.8 kg    Height:        Constitutional: NAD, alert and oriented x 3 Eyes: PERRL, lids and conjunctivae normal ENMT: Mucous membranes are moist.  Neck: normal, supple, no masses, no thyromegaly Respiratory: clear to auscultation bilaterally, no wheezing, no crackles. Normal respiratory effort. No accessory muscle use.  Cardiovascular: Regular rate and rhythm, no murmurs / rubs / gallops. No extremity edema. 2+ pedal pulses. No carotid bruits.  Abdomen: no tenderness, no masses palpated. No hepatosplenomegaly. Bowel sounds positive.  Musculoskeletal: no clubbing / cyanosis. No joint deformity upper and lower extremities.  Skin: no rashes, lesions, ulcers.  Neurologic: No gross focal neurologic deficit. Psychiatric: Normal mood and affect.   Labs on Admission: I have personally reviewed following labs and imaging studies  CBC: Recent Labs  Lab 05/29/19 0737 06/04/19 0138  WBC 6.2 16.9*  NEUTROABS 3.8 14.6*  HGB 9.5* 7.3*  HCT 29.8* 22.8*  MCV 89.0 91.6  PLT 173 [id number]   Basic Metabolic Panel: Recent Labs  Lab 05/28/19 0945 05/28/19 1858 05/28/19 2220 05/29/19 0737 06/04/19 0138  NA 136 136 134* 137 131*  K 2.9* 3.5 3.6 3.4* 3.7  CL 98 99 99 100 95*  CO2 28 29 26 29 26   GLUCOSE 271* 217* 264* 291* 122*  BUN 12 6 6 8  33*  CREATININE 3.39* 1.79* 1.97* 2.42* 3.38*  CALCIUM 8.0* 7.9* 7.8* 7.8* 8.2*   GFR: Estimated Creatinine  Clearance: 24.9 mL/min (A) (by C-G formula based on SCr of 3.38 mg/dL (H)). Liver Function Tests: Recent Labs  Lab 06/04/19 0138  AST 38  ALT 17  ALKPHOS 143*  BILITOT 0.8  PROT 5.5*  ALBUMIN 2.3*   Recent Labs  Lab 06/04/19 0138  LIPASE 33   No results for input(s): AMMONIA in the last 168 hours. Coagulation Profile: No results for input(s): INR, PROTIME in the last 168 hours. Cardiac Enzymes: No results for input(s): CKTOTAL, CKMB, CKMBINDEX, TROPONINI in the last 168 hours. BNP (last 3 results) No results for input(s): PROBNP in the last 8760 hours. HbA1C: No results for input(s): HGBA1C in the last 72 hours. CBG: Recent Labs  Lab 05/28/19 0812 05/28/19 1806 05/28/19 2156 05/29/19 0948 05/29/19 1146  GLUCAP 417* 153* 241* 303* 230*   Lipid Profile: No results for input(s): CHOL, HDL, LDLCALC, TRIG, CHOLHDL, LDLDIRECT in the  last 72 hours. Thyroid Function Tests: No results for input(s): TSH, T4TOTAL, FREET4, T3FREE, THYROIDAB in the last 72 hours. Anemia Panel: No results for input(s): VITAMINB12, FOLATE, FERRITIN, TIBC, IRON, RETICCTPCT in the last 72 hours. Urine analysis:    Component Value Date/Time   COLORURINE STRAW (A) 05/27/2019 2047   APPEARANCEUR CLEAR (A) 05/27/2019 2047   APPEARANCEUR Clear 01/17/2014 1551   LABSPEC 1.017 05/27/2019 2047   LABSPEC 1.011 01/17/2014 1551   PHURINE 6.0 05/27/2019 2047   GLUCOSEU >=500 (A) 05/27/2019 2047   GLUCOSEU Negative 01/17/2014 1551   HGBUR SMALL (A) 05/27/2019 2047   BILIRUBINUR NEGATIVE 05/27/2019 2047   BILIRUBINUR Negative 01/17/2014 1551   KETONESUR NEGATIVE 05/27/2019 2047   PROTEINUR >=300 (A) 05/27/2019 2047   NITRITE NEGATIVE 05/27/2019 2047   LEUKOCYTESUR NEGATIVE 05/27/2019 2047   LEUKOCYTESUR 1+ 01/17/2014 1551    Radiological Exams on Admission: CT Head Wo Contrast  Result Date: 06/04/2019 CLINICAL DATA:  Encephalopathy EXAM: CT HEAD WITHOUT CONTRAST TECHNIQUE: Contiguous axial images  were obtained from the base of the skull through the vertex without intravenous contrast. COMPARISON:  05/27/2019 FINDINGS: Brain: Unchanged area of encephalomalacia in the left frontal lobe. No intracranial hemorrhage or extra-axial collection. There is periventricular hypoattenuation compatible with chronic microvascular disease. Mild generalized volume loss. Old right caudate head infarct. Vascular: Atherosclerotic calcification of the internal carotid arteries at the skull base. Skull: Normal Sinuses/Orbits: Paranasal sinuses clear aside from chronic opacification within the left frontal sinus. Mastoids free of fluid. The orbits are normal. Other: None IMPRESSION: 1. No acute intracranial abnormality. 2. Old left frontal lobe infarct and findings of chronic ischemic microangiopathy. Electronically Signed   By: Ulyses Jarred M.D.   On: 06/04/2019 03:11   DG Chest Port 1 View  Result Date: 06/04/2019 CLINICAL DATA:  Weakness. Altered mentation. EXAM: PORTABLE CHEST 1 VIEW COMPARISON:  Radiograph and CT 05/27/2019 FINDINGS: Right dialysis catheter tip in the atrial caval junction. Lower lung volumes from prior exam. Cardiomegaly with equivocal increase. No interstitial and bronchial thickening. Small right pleural effusion no focal airspace disease. No pneumothorax. Subacute right lower rib fractures. IMPRESSION: 1. Lower lung volumes from prior exam. Cardiomegaly with interstitial and bronchial thickening, suspect fluid overload. 2. Small right pleural effusion. Electronically Signed   By: Keith Rake M.D.   On: 06/04/2019 02:02    EKG: Independently reviewed.   Assessment/Plan Active Problems:   HCAP (healthcare-associated pneumonia) Chest x-ray showed possible fluid overload but based on elevated white cell count of 17,000, possibility of pneumonia especially in view of recent hospitalization Continue Rocephin, azithromycin and vancomycin Oxygen to keep sats over 92% Follow-up Covid PCR and  maintain airborne precautions until confirmed negative   ESRD on hemodialysis ([place])   Type 2 diabetes Hold home hypoglycemic agents Supplemental coverage  Acute blood loss anemia of GI blood loss suspected Hemoglobin 7.3, down from 9.5 from discharge 1 week prior and stool for occult blood positive Transfuse 1 unit packed red blood cells Consider GI consult Keep NPO for now    HTN (hypertension) Hold home antihypertensives for now due to soft blood pressure    Frequent falls Physical therapy evaluation Head CT showed no acute intracranial finding    History of CVA (cerebrovascular accident) Hold aspirin for now in view of acute anemia Continue statins       DVT prophylaxis: heparin  Code Status: dull Family Communication: none  Disposition Plan: Back to previous home environment Consults called: nephrology     [place]  Lucile Shutters MD [facility]     06/04/2019, 4:09 AM

## 2019-06-04 NOTE — Plan of Care (Signed)
  Problem: Clinical Measurements: Goal: Complications related to the disease process, condition or treatment will be avoided or minimized Outcome: Progressing  Pt tolerated well the HD Tx

## 2019-06-04 NOTE — ED Notes (Signed)
Patient is resting comfortably. CCM with call bell in reach. NAD.

## 2019-06-04 NOTE — ED Notes (Signed)
1100: Patient ambulatory to [facility], standby assist without incident.   1115: Patient transported to dialysis via wheelchair.

## 2019-06-04 NOTE — Progress Notes (Signed)
Jonathon Bellows , MD [address], [place], [place], [place], [address] [address], [place], [place], [place], [address] Phone: [phone]  Fax: [phone]  Consultation  Referring Provider:     Dr Mal Misty Primary Care Physician:  [name], MD Primary Gastroenterologist:  Dr. Allen Norris         Reason for Consultation:     Anemia   Date of Admission:  06/04/2019 Date of Consultation:  06/04/2019         HPI:   [name] is a 59 y.o. male with a history of ESRD on dialysis . Presented with a fall.   Baseline hb around 9 grams and was incidentally found to be 7.3 grams on admission. No elevation in BUN/cr ratio CT abdomen 05/27/2019 shows no gross abdominal abnormality.  He has had a recent colonoscopy in 02/2019 which showed 3 small polyps . No other lesions. The sessile polyp had some dysplasia.  Did not follow up after with Dr Allen Norris.   Denies any hematemesis, melena, blood in stool, nasal bleeds, abdominal pain,nsaid use.  Past Medical History:  Diagnosis Date  . Chronic kidney disease   . Diabetes mellitus without complication ([place])   . ETOH abuse   . Hyperlipidemia   . Hypertension   . Stroke [facility])     Past Surgical History:  Procedure Laterality Date  . COLONOSCOPY WITH PROPOFOL N/A 03/06/2019   Procedure: COLONOSCOPY WITH PROPOFOL;  Surgeon: Lucilla Lame, MD;  Location: [facility] ENDOSCOPY;  Service: Endoscopy;  Laterality: N/A;  . DIALYSIS/PERMA CATHETER INSERTION N/A 04/10/2019   Procedure: DIALYSIS/PERMA CATHETER INSERTION;  Surgeon: Serafina Mitchell, MD;  Location: [facility] CV LAB;  Service: Cardiovascular;  Laterality: N/A;  . ESOPHAGOGASTRODUODENOSCOPY (EGD) WITH PROPOFOL N/A 07/01/2016   Procedure: ESOPHAGOGASTRODUODENOSCOPY (EGD) WITH PROPOFOL;  Surgeon: San Jetty, MD;  Location: [facility] ENDOSCOPY;  Service: General;  Laterality: N/A;    Prior to Admission medications   Medication Sig Start Date End Date Taking? Authorizing Provider  amLODipine  (NORVASC) 5 MG tablet Take 2 tablets (10 mg total) by mouth daily. 05/29/19  Yes Lavina Hamman, MD  folic acid (FOLVITE) 1 MG tablet Take 1 mg by mouth daily. 03/12/19  Yes [provider]  hydrocortisone 2.5 % cream Apply 1 application topically 2 (two) times daily as needed (rash).  04/08/19  Yes [provider]  insulin glargine (LANTUS) 100 UNIT/ML injection Inject 0.15 mLs (15 Units total) into the skin daily. Patient taking differently: Inject 15 Units into the skin daily as needed (elevated blood sugar).  07/27/18  Yes Mody, Ulice Bold, MD  Melatonin 3 MG TABS Take 1 tablet by mouth at bedtime. 05/23/19  Yes [provider]  metoprolol tartrate (LOPRESSOR) 50 MG tablet Take 50 mg by mouth 2 (two) times daily. 02/16/19 02/16/20 Yes [provider]  pantoprazole (PROTONIX) 40 MG tablet Take 40 mg by mouth daily. 12/18/18  Yes [provider]  sertraline (ZOLOFT) 100 MG tablet Take 100 mg by mouth daily. 04/08/19  Yes [provider]    Family History  Problem Relation Age of Onset  . CAD Brother   . Dementia Mother   . Renal Disease Father      Social History   Tobacco Use  . Smoking status: Current Every Day Smoker    Packs/day: 0.25    Years: 5.00    Pack years: 1.25    Types: Cigars  . Smokeless tobacco: Never Used  Substance Use Topics  .  Alcohol use: Yes    Alcohol/week: 16.0 standard drinks    Types: 2 Shots of liquor, 14 Cans of beer per week  . Drug use: No    Allergies as of 06/04/2019 - Review Complete 06/04/2019  Allergen Reaction Noted  . Ferrous gluconate Nausea And Vomiting 02/17/2014    Review of Systems:    All systems reviewed and negative except where noted in HPI.   Physical Exam:  Vital signs in last 24 hours: Temp:  [97.4 F (36.3 C)-98.2 F (36.8 C)] 97.8 F (36.6 C) (01/21 1130) Pulse Rate:  [67-82] 72 (01/21 1315) Resp:  [15-20] 15 (01/21 1315) BP: (105-188)/(68-93) 144/85 (01/21 1315) SpO2:   [92 %-100 %] 100 % (01/21 1315) Weight:  [6.804 kg-75 kg] 75 kg (01/21 1200) Last BM Date: 05/29/19 General:   Pleasant, cooperative in NAD Head:  Normocephalic and atraumatic. Eyes:   No icterus.   Conjunctiva pink. PERRLA. Ears:  Normal auditory acuity. Neck:  Supple; no masses or thyroidomegaly Lungs: Respirations even and unlabored. Lungs clear to auscultation bilaterally.   No wheezes, crackles, or rhonchi.  Heart:  Regular rate and rhythm;  Without murmur, clicks, rubs or gallops Abdomen:  Soft, nondistended, nontender. Normal bowel sounds. No appreciable masses or hepatomegaly.  No rebound or guarding.  Neurologic:  Alert and oriented x3;  grossly normal neurologically. Skin:  Intact without significant lesions or rashes. Cervical Nodes:  No significant cervical adenopathy. Psych:  Alert and cooperative. Normal affect.  LAB RESULTS: Recent Labs    06/04/19 0138  WBC 16.9*  HGB 7.3*  HCT 22.8*  PLT 177   BMET Recent Labs    06/04/19 0138  NA 131*  K 3.7  CL 95*  CO2 26  GLUCOSE 122*  BUN 33*  CREATININE 3.38*  CALCIUM 8.2*   LFT Recent Labs    06/04/19 0138  PROT 5.5*  ALBUMIN 2.3*  AST 38  ALT 17  ALKPHOS 143*  BILITOT 0.8   PT/INR No results for input(s): LABPROT, INR in the last 72 hours.  STUDIES: CT Head Wo Contrast  Result Date: 06/04/2019 CLINICAL DATA:  Encephalopathy EXAM: CT HEAD WITHOUT CONTRAST TECHNIQUE: Contiguous axial images were obtained from the base of the skull through the vertex without intravenous contrast. COMPARISON:  05/27/2019 FINDINGS: Brain: Unchanged area of encephalomalacia in the left frontal lobe. No intracranial hemorrhage or extra-axial collection. There is periventricular hypoattenuation compatible with chronic microvascular disease. Mild generalized volume loss. Old right caudate head infarct. Vascular: Atherosclerotic calcification of the internal carotid arteries at the skull base. Skull: Normal Sinuses/Orbits:  Paranasal sinuses clear aside from chronic opacification within the left frontal sinus. Mastoids free of fluid. The orbits are normal. Other: None IMPRESSION: 1. No acute intracranial abnormality. 2. Old left frontal lobe infarct and findings of chronic ischemic microangiopathy. Electronically Signed   By: Ulyses Jarred M.D.   On: 06/04/2019 03:11   DG Chest Port 1 View  Result Date: 06/04/2019 CLINICAL DATA:  Weakness. Altered mentation. EXAM: PORTABLE CHEST 1 VIEW COMPARISON:  Radiograph and CT 05/27/2019 FINDINGS: Right dialysis catheter tip in the atrial caval junction. Lower lung volumes from prior exam. Cardiomegaly with equivocal increase. No interstitial and bronchial thickening. Small right pleural effusion no focal airspace disease. No pneumothorax. Subacute right lower rib fractures. IMPRESSION: 1. Lower lung volumes from prior exam. Cardiomegaly with interstitial and bronchial thickening, suspect fluid overload. 2. Small right pleural effusion. Electronically Signed   By: Keith Rake M.D.   On: 06/04/2019  02:02      Impression / Plan:   [name] is a 59 y.o. y/o male ESRD on dialysis admitted with a fall and incidentally noted to have a lower Hb than previously. Not on any blood thinners. No overt blood loss .Recent colonoscopy 3 months back by Dr Allen Norris showed no abnormalities.    1. Stool testing is only for colon cancer screening and does not rule in or out a GI bleed nor does it localize an area.   2.  As he is on dialysis and due to shifts in fluid- suggest to recheck Hb after his dialysis and compare to baseline- if there is a change from baseline irrespective of a stool test would need an EGD and if negative outpatient capsule study of the small bowel in addition to checking iron studies and b12 levels.   Thank you for involving me in the care of this patient.      LOS: 0 days   Jonathon Bellows, MD  06/04/2019, 2:21 PM

## 2019-06-04 NOTE — ED Notes (Signed)
Lab to ED to draw due to multiple unsuccessful attempts. Per lab, CBC, HIV and all other blood work needs to be completed after second unit.

## 2019-06-04 NOTE — ED Provider Notes (Signed)
[facility] Emergency Department Provider Note   ____________________________________________   First MD Initiated Contact with Patient 06/04/19 0133     (approximate)  I have reviewed the triage vital signs and the nursing notes.   HISTORY  Chief Complaint Altered Mental Status  Level V caveat: Limited by altered mentation  HPI [name] is a 59 y.o. male brought to the ED via EMS from home for altered mental status.  Patient with a history of ESRD on HD T/TW/SAT, diabetes, EtOH abuse who was recently hospitalized from 1/13-1/15 for recurrent falls.  Found to have hyperkalemia, hyperglycemia, pseudonatremia and posterior right eighth rib fracture.  Wife called EMS tonight for concerns of fall and decreased oral intake over the past few days.  Wife is also a poor historian but just states that patient has been declining since he started hemodialysis 1 month ago.  Denied acute illness to EMS.  Rest of history is limited secondary to patient's altered mental status.       Past Medical History:  Diagnosis Date  . Chronic kidney disease   . Diabetes mellitus without complication ([place])   . ETOH abuse   . Hyperlipidemia   . Hypertension   . Stroke [facility])     Patient Active Problem List   Diagnosis Date Noted  . Anemia 06/04/2019  . Frequent falls 06/04/2019  . History of CVA (cerebrovascular accident) 06/04/2019  . HCAP (healthcare-associated pneumonia) 06/04/2019  . Hyperglycemia 05/28/2019  . Fall at home, initial encounter 05/28/2019  . Rib fracture 05/28/2019  . Depression 05/28/2019  . Hypokalemia 05/28/2019  . Weakness 05/27/2019  . ESRD on hemodialysis ([place])   . Acute renal failure (ARF) ([place]) 04/09/2019  . Polyp of ascending colon   . Diarrhea   . AKI (acute kidney injury) ([place]) 03/04/2019  . Acute kidney injury ([place]) 07/26/2018  . ARF (acute renal failure) ([place]) 07/25/2018  . CVA (cerebral vascular accident) ([place]) 03/17/2018  . UTI  (urinary tract infection) 02/27/2018  . Hypoglycemia 01/15/2018  . Type 2 diabetes mellitus without complication, with long-term current use of insulin ([place]) 08/27/2017  . Protein-calorie malnutrition, severe 08/19/2017  . Pancreatitis, acute 08/16/2017  . DKA (diabetic ketoacidoses) ([place]) 08/16/2017  . HTN (hypertension) 08/16/2017  . HLD (hyperlipidemia) 08/16/2017  . Carotid stenosis 08/02/2016  . GERD (gastroesophageal reflux disease) 08/02/2016  . History of esophagogastroduodenoscopy (EGD) 07/01/2016  . History of recent blood transfusion 07/01/2016  . Acute renal failure superimposed on stage 4 chronic kidney disease ([place]) 07/01/2016  . Hyponatremia 07/01/2016  . Uncontrolled diabetes mellitus ([place]) 07/01/2016  . Alcohol abuse 07/01/2016  . Monilial esophagitis ([place]) 07/01/2016  . Tobacco abuse 07/01/2016  . Confusion 06/29/2016  . Iron deficiency anemia 06/29/2016  . Coagulopathy ([place]) 06/29/2016    Past Surgical History:  Procedure Laterality Date  . COLONOSCOPY WITH PROPOFOL N/A 03/06/2019   Procedure: COLONOSCOPY WITH PROPOFOL;  Surgeon: Lucilla Lame, MD;  Location: [facility] ENDOSCOPY;  Service: Endoscopy;  Laterality: N/A;  . DIALYSIS/PERMA CATHETER INSERTION N/A 04/10/2019   Procedure: DIALYSIS/PERMA CATHETER INSERTION;  Surgeon: Serafina Mitchell, MD;  Location: [place] CV LAB;  Service: Cardiovascular;  Laterality: N/A;  . ESOPHAGOGASTRODUODENOSCOPY (EGD) WITH PROPOFOL N/A 07/01/2016   Procedure: ESOPHAGOGASTRODUODENOSCOPY (EGD) WITH PROPOFOL;  Surgeon: San Jetty, MD;  Location: [facility] ENDOSCOPY;  Service: General;  Laterality: N/A;    Prior to Admission medications   Medication Sig Start Date End Date Taking? Authorizing Provider  amLODipine (NORVASC) 5 MG tablet Take 2  tablets (10 mg total) by mouth daily. 05/29/19  Yes Lavina Hamman, MD  folic acid (FOLVITE) 1 MG tablet Take 1 mg by mouth daily. 03/12/19  Yes [provider]  hydrocortisone 2.5 %  cream Apply 1 application topically 2 (two) times daily as needed (rash).  04/08/19  Yes [provider]  insulin glargine (LANTUS) 100 UNIT/ML injection Inject 0.15 mLs (15 Units total) into the skin daily. Patient taking differently: Inject 15 Units into the skin daily as needed (elevated blood sugar).  07/27/18  Yes Mody, Ulice Bold, MD  Melatonin 3 MG TABS Take 1 tablet by mouth at bedtime. 05/23/19  Yes [provider]  metoprolol tartrate (LOPRESSOR) 50 MG tablet Take 50 mg by mouth 2 (two) times daily. 02/16/19 02/16/20 Yes [provider]  pantoprazole (PROTONIX) 40 MG tablet Take 40 mg by mouth daily. 12/18/18  Yes [provider]  sertraline (ZOLOFT) 100 MG tablet Take 100 mg by mouth daily. 04/08/19  Yes [provider]    Allergies Ferrous gluconate  Family History  Problem Relation Age of Onset  . CAD Brother   . Dementia Mother   . Renal Disease Father     Social History Social History   Tobacco Use  . Smoking status: Current Every Day Smoker    Packs/day: 0.25    Years: 5.00    Pack years: 1.25    Types: Cigars  . Smokeless tobacco: Never Used  Substance Use Topics  . Alcohol use: Yes    Alcohol/week: 16.0 standard drinks    Types: 2 Shots of liquor, 14 Cans of beer per week  . Drug use: No    Review of Systems  Constitutional: Positive for recurrent falls.  No fever/chills Eyes: No visual changes. ENT: No sore throat. Cardiovascular: Denies chest pain. Respiratory: Denies shortness of breath. Gastrointestinal: No abdominal pain.  No nausea, no vomiting.  No diarrhea.  No constipation. Genitourinary: Negative for dysuria. Musculoskeletal: Negative for back pain. Skin: Negative for rash. Neurological: Positive for altered mental status.  Negative for headaches, focal weakness or numbness.   ____________________________________________   PHYSICAL EXAM:  VITAL SIGNS: ED Triage Vitals  Enc Vitals Group     BP       Pulse      Resp      Temp      Temp src      SpO2      Weight      Height      Head Circumference      Peak Flow      Pain Score      Pain Loc      Pain Edu?      Excl. in [place]?     Constitutional: Alert. Well appearing and in no acute distress. Eyes: Conjunctivae are normal. PERRL. EOMI. Head: Atraumatic. Nose: Atraumatic. Mouth/Throat: Mucous membranes are moist.  No dental malocclusion. Neck: No stridor.  No cervical spine tenderness to palpation.  No step-offs or deformities noted. Cardiovascular: Normal rate, regular rhythm. Grossly normal heart sounds.  Good peripheral circulation. Respiratory: Normal respiratory effort.  No retractions. Lungs slightly diminished bilaterally. Gastrointestinal: Soft and nontender. No distention. No abdominal bruits. No CVA tenderness. Musculoskeletal: No lower extremity tenderness nor edema.  No joint effusions. Neurologic: Eyes open spontaneously.  Patient tracking this examiner.  Nonverbal.  MAEx4. Skin:  Skin is warm, dry and intact. No rash noted. Psychiatric: Mood and affect are normal. Speech and behavior are normal.  ____________________________________________  LABS (all labs ordered are listed, but only abnormal results are displayed)  Labs Reviewed  CBC WITH DIFFERENTIAL/PLATELET - Abnormal; Notable for the following components:      Result Value   WBC 16.9 (*)    RBC 2.49 (*)    Hemoglobin 7.3 (*)    HCT 22.8 (*)    Neutro Abs 14.6 (*)    Abs Immature Granulocytes 0.35 (*)    All other components within normal limits  COMPREHENSIVE METABOLIC PANEL - Abnormal; Notable for the following components:   Sodium 131 (*)    Chloride 95 (*)    Glucose, Bld 122 (*)    BUN 33 (*)    Creatinine, Ser 3.38 (*)    Calcium 8.2 (*)    Total Protein 5.5 (*)    Albumin 2.3 (*)    Alkaline Phosphatase 143 (*)    GFR calc non Af Amer 19 (*)    GFR calc Af Amer 22 (*)    All other components within normal limits  ACETAMINOPHEN LEVEL  - Abnormal; Notable for the following components:   Acetaminophen (Tylenol), Serum <10 (*)    All other components within normal limits  SALICYLATE LEVEL - Abnormal; Notable for the following components:   Salicylate Lvl [id number] (*)    All other components within normal limits  LACTIC ACID, PLASMA - Abnormal; Notable for the following components:   Lactic Acid, Venous 2.5 (*)    All other components within normal limits  RESPIRATORY PANEL BY RT PCR (FLU A&B, COVID)  CULTURE, BLOOD (ROUTINE X 2)  CULTURE, BLOOD (ROUTINE X 2)  MRSA PCR SCREENING  EXPECTORATED SPUTUM ASSESSMENT W REFEX TO RESP CULTURE  ETHANOL  LIPASE, BLOOD  LACTIC ACID, PLASMA  URINALYSIS, COMPLETE (UACMP) WITH MICROSCOPIC  URINE DRUG SCREEN, QUALITATIVE ([facility] ONLY)  HIV ANTIBODY (ROUTINE TESTING W REFLEX)  CBC  TYPE AND SCREEN  PREPARE RBC (CROSSMATCH)  PREPARE RBC (CROSSMATCH)  TROPONIN I (HIGH SENSITIVITY)  TROPONIN I (HIGH SENSITIVITY)   ____________________________________________  EKG  ED ECG REPORT I, Yee Gangi J, the attending physician, personally viewed and interpreted this ECG.   Date: 06/04/2019  EKG Time: 0149  Rate: 79  Rhythm: normal EKG, normal sinus rhythm  Axis: Normal  Intervals:QTC 503  ST&T Change: Nonspecific  ____________________________________________  RADIOLOGY  ED MD interpretation: No ICH; chest x-ray demonstrates fluid overload  Official radiology report(s): CT Head Wo Contrast  Result Date: 06/04/2019 CLINICAL DATA:  Encephalopathy EXAM: CT HEAD WITHOUT CONTRAST TECHNIQUE: Contiguous axial images were obtained from the base of the skull through the vertex without intravenous contrast. COMPARISON:  05/27/2019 FINDINGS: Brain: Unchanged area of encephalomalacia in the left frontal lobe. No intracranial hemorrhage or extra-axial collection. There is periventricular hypoattenuation compatible with chronic microvascular disease. Mild generalized volume loss. Old right caudate  head infarct. Vascular: Atherosclerotic calcification of the internal carotid arteries at the skull base. Skull: Normal Sinuses/Orbits: Paranasal sinuses clear aside from chronic opacification within the left frontal sinus. Mastoids free of fluid. The orbits are normal. Other: None IMPRESSION: 1. No acute intracranial abnormality. 2. Old left frontal lobe infarct and findings of chronic ischemic microangiopathy. Electronically Signed   By: Ulyses Jarred M.D.   On: 06/04/2019 03:11   DG Chest Port 1 View  Result Date: 06/04/2019 CLINICAL DATA:  Weakness. Altered mentation. EXAM: PORTABLE CHEST 1 VIEW COMPARISON:  Radiograph and CT 05/27/2019 FINDINGS: Right dialysis catheter tip in the atrial caval junction. Lower lung volumes from prior exam. Cardiomegaly with equivocal  increase. No interstitial and bronchial thickening. Small right pleural effusion no focal airspace disease. No pneumothorax. Subacute right lower rib fractures. IMPRESSION: 1. Lower lung volumes from prior exam. Cardiomegaly with interstitial and bronchial thickening, suspect fluid overload. 2. Small right pleural effusion. Electronically Signed   By: Keith Rake M.D.   On: 06/04/2019 02:02    ____________________________________________   PROCEDURES  Procedure(s) performed (including Critical Care):  Procedures  CRITICAL CARE Performed by: Paulette Blanch   Total critical care time: 30 minutes  Critical care time was exclusive of separately billable procedures and treating other patients.  Critical care was necessary to treat or prevent imminent or life-threatening deterioration.  Critical care was time spent personally by me on the following activities: development of treatment plan with patient and/or surrogate as well as nursing, discussions with consultants, evaluation of patient's response to treatment, examination of patient, obtaining history from patient or surrogate, ordering and performing treatments and  interventions, ordering and review of laboratory studies, ordering and review of radiographic studies, pulse oximetry and re-evaluation of patient's condition.  ____________________________________________   INITIAL IMPRESSION / ASSESSMENT AND PLAN / ED COURSE  As part of my medical decision making, I reviewed the following data within the [place] notes reviewed and incorporated, Labs reviewed, EKG interpreted, Old chart reviewed, Radiograph reviewed, Discussed with admitting physician and Notes from prior ED visits     [name] was evaluated in Emergency Department on 06/04/2019 for the symptoms described in the history of present illness. He was evaluated in the context of the global COVID-19 pandemic, which necessitated consideration that the patient might be at risk for infection with the SARS-CoV-2 virus that causes COVID-19. Institutional protocols and algorithms that pertain to the evaluation of patients at risk for COVID-19 are in a state of rapid change based on information released by regulatory bodies including the CDC and federal and state organizations. These policies and algorithms were followed during the patient's care in the ED.    59 year old male with ESRD, diabetes, hypertension, EtOH abuse presenting with altered mental status. Differential diagnosis includes, but is not limited to, alcohol, illicit or prescription medications, or other toxic ingestion; intracranial pathology such as stroke or intracerebral hemorrhage; fever or infectious causes including sepsis; hypoxemia and/or hypercarbia; uremia; trauma; endocrine related disorders such as diabetes, hypoglycemia, and thyroid-related diseases; hypertensive encephalopathy; etc.  Obtain toxicological lab work and urine, CT head, chest x-ray.  Anticipate hospitalization.   Clinical Course as of Jun 03 521  Thu Jun 04, 2019  0140 H/H noted. Guiac exam reveals tan stool which is guiac POSITIVE.  Will transfuse 1 unit pRBC.   [JS]    Clinical Course User Index [JS] Paulette Blanch, MD     ____________________________________________   FINAL CLINICAL IMPRESSION(S) / ED DIAGNOSES  Final diagnoses:  Altered mental status, unspecified altered mental status type  Gastrointestinal hemorrhage, unspecified gastrointestinal hemorrhage type  Elevated lactic acid level     ED Discharge Orders    None       Note:  This document was prepared using Dragon voice recognition software and may include unintentional dictation errors.   Paulette Blanch, MD 06/04/19 [phone]

## 2019-06-04 NOTE — Progress Notes (Signed)
Post HD Tx Note  Pt tolerated well the Tx. The prescribed UF of 1.5L was met. Pt's BP was normal; slightly high throughout the [place]. Pt SPO2 desat to 88, 2L O2 per [place] was initiated. Pt is A*4. Pt is informed that he is going back to ED for Admission. CBC and HIV labs were drawn as per PO. CVC Heparin Locked dressing changed, limbs closed and clamped   06/04/19 1530  Hand-Off documentation  Report given to (Full Name) Reginald Danas RN   Report received from (Full Name) Newt Minion RN   Vital Signs  Temp 98.1 F (36.7 C)  Temp Source Oral  Pulse Rate 69  Pulse Rate Source Monitor  Resp 19  BP (!) 174/92  BP Location Left Arm  BP Method Automatic  Patient Position (if appropriate) Lying  Oxygen Therapy  SpO2 100 %  O2 Device Nasal Cannula  O2 Flow Rate (L/min) 2 L/min  Post-Hemodialysis Assessment  Rinseback Volume (mL) 250 mL  KECN 79.9 V  Dialyzer Clearance Lightly streaked  Duration of HD Treatment -hour(s) 3.5 hour(s)  Hemodialysis Intake (mL) 500 mL  UF Total -Machine (mL) 1500 mL  Net UF (mL) 1000 mL  Tolerated HD Treatment Yes  Hemodialysis Catheter Right Internal jugular Double lumen Permanent (Tunneled)  Placement Date/Time: 04/10/19 1440   Placed prior to admission: Yes  Time Out: Correct patient;Correct site;Correct procedure  Maximum sterile barrier precautions: Hand hygiene;Cap;Sterile gown;Mask;Sterile gloves;Large sterile sheet  Site Prep: Chlor...  Site Condition No complications  Blue Lumen Status Heparin locked  Red Lumen Status Heparin locked  Purple Lumen Status N/A  Catheter fill solution Heparin 1000 units/ml  Catheter fill volume (Arterial) 1.5 cc  Catheter fill volume (Venous) 1.5  Dressing Type Biopatch  Dressing Status Clean;Dry;Intact  Interventions New dressing;Dressing changed  Drainage Description None  Dressing Change Due 06/08/19  Post treatment catheter status Capped and Clamped

## 2019-06-05 ENCOUNTER — Inpatient Hospital Stay: Payer: Medicare Other

## 2019-06-05 DIAGNOSIS — E1165 Type 2 diabetes mellitus with hyperglycemia: Secondary | ICD-10-CM

## 2019-06-05 DIAGNOSIS — E1100 Type 2 diabetes mellitus with hyperosmolarity without nonketotic hyperglycemic-hyperosmolar coma (NKHHC): Secondary | ICD-10-CM

## 2019-06-05 DIAGNOSIS — J189 Pneumonia, unspecified organism: Principal | ICD-10-CM

## 2019-06-05 LAB — CBC WITH DIFFERENTIAL/PLATELET
Abs Immature Granulocytes: 0.1 10*3/uL — ABNORMAL HIGH (ref 0.00–0.07)
Basophils Absolute: 0 10*3/uL (ref 0.0–0.1)
Basophils Relative: 0 %
Eosinophils Absolute: 0.1 10*3/uL (ref 0.0–0.5)
Eosinophils Relative: 1 %
HCT: 34.5 % — ABNORMAL LOW (ref 39.0–52.0)
Hemoglobin: 11 g/dL — ABNORMAL LOW (ref 13.0–17.0)
Immature Granulocytes: 1 %
Lymphocytes Relative: 13 %
Lymphs Abs: 1.6 10*3/uL (ref 0.7–4.0)
MCH: 29.2 pg (ref 26.0–34.0)
MCHC: 31.9 g/dL (ref 30.0–36.0)
MCV: 91.5 fL (ref 80.0–100.0)
Monocytes Absolute: 0.5 10*3/uL (ref 0.1–1.0)
Monocytes Relative: 4 %
Neutro Abs: 10.2 10*3/uL — ABNORMAL HIGH (ref 1.7–7.7)
Neutrophils Relative %: 81 %
Platelets: 182 10*3/uL (ref 150–400)
RBC: 3.77 MIL/uL — ABNORMAL LOW (ref 4.22–5.81)
RDW: 15.2 % (ref 11.5–15.5)
WBC: 12.6 10*3/uL — ABNORMAL HIGH (ref 4.0–10.5)
nRBC: 0 % (ref 0.0–0.2)

## 2019-06-05 LAB — BPAM RBC
Blood Product Expiration Date: 202102122359
Blood Product Expiration Date: 202102142359
ISSUE DATE / TIME: 202101210328
ISSUE DATE / TIME: 202101210604
Unit Type and Rh: 6200
Unit Type and Rh: 6200

## 2019-06-05 LAB — GLUCOSE, CAPILLARY
Glucose-Capillary: >600 mg/dL (ref 70–99)
Glucose-Capillary: >600 mg/dL (ref 70–99)
Glucose-Capillary: >600 mg/dL (ref 70–99)
Glucose-Capillary: 247 mg/dL — ABNORMAL HIGH (ref 70–99)
Glucose-Capillary: 326 mg/dL — ABNORMAL HIGH (ref 70–99)
Glucose-Capillary: 123 mg/dL — ABNORMAL HIGH (ref 70–99)

## 2019-06-05 LAB — BASIC METABOLIC PANEL
Anion gap: 13 (ref 5–15)
BUN: 21 mg/dL — ABNORMAL HIGH (ref 6–20)
CO2: 24 mmol/L (ref 22–32)
Calcium: 7.9 mg/dL — ABNORMAL LOW (ref 8.9–10.3)
Chloride: 95 mmol/L — ABNORMAL LOW (ref 98–111)
Creatinine, Ser: 2.8 mg/dL — ABNORMAL HIGH (ref 0.61–1.24)
GFR calc Af Amer: 27 mL/min — ABNORMAL LOW (ref >60)
GFR calc non Af Amer: 24 mL/min — ABNORMAL LOW (ref >60)
Glucose, Bld: 765 mg/dL (ref 70–99)
Potassium: 3.7 mmol/L (ref 3.5–5.1)
Sodium: 132 mmol/L — ABNORMAL LOW (ref 135–145)

## 2019-06-05 LAB — URINALYSIS, COMPLETE (UACMP) WITH MICROSCOPIC
Bacteria, UA: NONE SEEN
Bilirubin Urine: NEGATIVE
Glucose, UA: >=500 mg/dL — AB
Ketones, ur: NEGATIVE mg/dL
Leukocytes,Ua: NEGATIVE
Nitrite: NEGATIVE
Protein, ur: 100 mg/dL — AB
Specific Gravity, Urine: 1.015 (ref 1.005–1.030)
pH: 6 (ref 5.0–8.0)

## 2019-06-05 LAB — URINE DRUG SCREEN, QUALITATIVE (ARMC ONLY)
Amphetamines, Ur Screen: NOT DETECTED
Barbiturates, Ur Screen: NOT DETECTED
Benzodiazepine, Ur Scrn: NOT DETECTED
Cannabinoid 50 Ng, Ur ~~LOC~~: NOT DETECTED
Cocaine Metabolite,Ur ~~LOC~~: NOT DETECTED
MDMA (Ecstasy)Ur Screen: NOT DETECTED
Methadone Scn, Ur: NOT DETECTED
Opiate, Ur Screen: NOT DETECTED
Phencyclidine (PCP) Ur S: NOT DETECTED
Tricyclic, Ur Screen: NOT DETECTED

## 2019-06-05 LAB — TYPE AND SCREEN
ABO/RH(D): A POS
Antibody Screen: NEGATIVE
Unit division: 0
Unit division: 0

## 2019-06-05 LAB — GLUCOSE, RANDOM: Glucose, Bld: 771 mg/dL (ref 70–99)

## 2019-06-05 MED ORDER — FOLIC ACID 1 MG PO TABS
1.0000 mg | ORAL_TABLET | Freq: Every day | ORAL | Status: DC
  Administered 2019-06-05 – 2019-06-09 (×5): 1 mg via ORAL
  Filled 2019-06-05 (×5): qty 1

## 2019-06-05 MED ORDER — INSULIN REGULAR(HUMAN) IN NACL 100-0.9 UT/100ML-% IV SOLN
INTRAVENOUS | Status: DC
  Administered 2019-06-05: 9.5 [IU]/h via INTRAVENOUS
  Filled 2019-06-05: qty 100

## 2019-06-05 MED ORDER — SERTRALINE HCL 50 MG PO TABS
100.0000 mg | ORAL_TABLET | Freq: Every day | ORAL | Status: DC
  Administered 2019-06-06 – 2019-06-09 (×4): 100 mg via ORAL
  Filled 2019-06-05 (×4): qty 2

## 2019-06-05 MED ORDER — AMLODIPINE BESYLATE 10 MG PO TABS
10.0000 mg | ORAL_TABLET | Freq: Every day | ORAL | Status: DC
  Administered 2019-06-05 – 2019-06-08 (×4): 10 mg via ORAL
  Filled 2019-06-05 (×4): qty 1

## 2019-06-05 MED ORDER — INSULIN ASPART 100 UNIT/ML ~~LOC~~ SOLN
15.0000 [IU] | Freq: Once | SUBCUTANEOUS | Status: AC
  Administered 2019-06-05: 15 [IU] via SUBCUTANEOUS
  Filled 2019-06-05: qty 1

## 2019-06-05 MED ORDER — METOPROLOL TARTRATE 50 MG PO TABS
50.0000 mg | ORAL_TABLET | Freq: Two times a day (BID) | ORAL | Status: DC
  Administered 2019-06-05 – 2019-06-08 (×7): 50 mg via ORAL
  Filled 2019-06-05 (×7): qty 1

## 2019-06-05 MED ORDER — INSULIN ASPART 100 UNIT/ML ~~LOC~~ SOLN: 0.0000 [IU] | Freq: Three times a day (TID) | SUBCUTANEOUS | Status: DC

## 2019-06-05 MED ORDER — DEXTROSE 50 % IV SOLN
0.0000 mL | INTRAVENOUS | Status: DC | PRN
  Administered 2019-06-06: 50 mL via INTRAVENOUS
  Filled 2019-06-05: qty 50

## 2019-06-05 MED ORDER — INSULIN ASPART 100 UNIT/ML ~~LOC~~ SOLN
10.0000 [IU] | SUBCUTANEOUS | Status: AC
  Administered 2019-06-05: 10 [IU] via SUBCUTANEOUS
  Filled 2019-06-05: qty 1

## 2019-06-05 MED ORDER — PANTOPRAZOLE SODIUM 40 MG PO TBEC
40.0000 mg | DELAYED_RELEASE_TABLET | Freq: Every day | ORAL | Status: DC
  Administered 2019-06-05 – 2019-06-09 (×5): 40 mg via ORAL
  Filled 2019-06-05 (×5): qty 1

## 2019-06-05 MED ORDER — INSULIN ASPART 100 UNIT/ML ~~LOC~~ SOLN: 0.0000 [IU] | Freq: Every day | SUBCUTANEOUS | Status: DC

## 2019-06-05 NOTE — Progress Notes (Signed)
Talked to the patient's wife this evening and let her know the patient was going to be transferred to ICU for the insulin drip.  She acknowledged and thanked me for letting her know.

## 2019-06-05 NOTE — TOC Initial Note (Signed)
Transition of Care [facility]) - Initial/Assessment Note    Patient Details  Name: [name] MRN: [id number] Date of Birth: 09-05-1960  Transition of Care [facility]) CM/SW Contact:    Shelbie Ammons, RN Phone Number: 06/05/2019, 4:12 PM  Clinical Narrative:   RNCM spoke with patient who requested conversation be done with his wife. RNCM placed call to wife to discuss patient's needs. Wife reports that patient has been falling quite frequently and she is concerned for his safety at home. Wife reports that home health was supposed to be started when he came home the last time but it never was. Wife reports that other than dialysis she transports patient to his appointments and he has all necessary equipment at home. Wife verbalizes to this nurse that she believes patient needs rehab to get stronger. Did discuss that patient will need to be evaluated by PT and they would have to give recommendation first for which she verbalizes understanding. RNCM requested MD order PT eval which was ordered. RNCM will continue to follow.          Expected Discharge Plan: Skilled Nursing Facility Barriers to Discharge: Continued Medical Work up   Patient Goals and CMS Choice Patient states their goals for this hospitalization and ongoing recovery are:: Per wife he needs to go into a rehab facility to get stronger and stop falling      Expected Discharge Plan and Services Expected Discharge Plan: [place]   Discharge Planning Services: CM Consult   Living arrangements for the past 2 months: Single Family Home                                      Prior Living Arrangements/Services Living arrangements for the past 2 months: Single Family Home Lives with:: Spouse Patient language and need for interpreter reviewed:: Yes Do you feel safe going back to the place where you live?: Yes      Need for Family Participation in Patient Care: Yes (Comment) Care giver support system in place?: Yes  (comment)   Criminal Activity/Legal Involvement Pertinent to Current Situation/Hospitalization: No - Comment as needed  Activities of Daily Living Home Assistive Devices/Equipment: Walker (specify type), Cane (specify quad or straight) ADL Screening (condition at time of admission) Patient's cognitive ability adequate to safely complete daily activities?: Yes Is the patient deaf or have difficulty hearing?: No Does the patient have difficulty seeing, even when wearing glasses/contacts?: No Does the patient have difficulty concentrating, remembering, or making decisions?: No Patient able to express need for assistance with ADLs?: Yes Does the patient have difficulty dressing or bathing?: No Independently performs ADLs?: Yes (appropriate for developmental age) Does the patient have difficulty walking or climbing stairs?: No Weakness of Legs: None Weakness of Arms/Hands: None  Permission Sought/Granted                  Emotional Assessment Appearance:: Appears stated age     Orientation: : Oriented to Self, Oriented to Place   Psych Involvement: No (comment)  Admission diagnosis:  Acute blood loss anemia [D62] Pneumonia [J18.9] HCAP (healthcare-associated pneumonia) [J18.9] Elevated lactic acid level [R79.89] Altered mental status, unspecified altered mental status type [R41.82] Gastrointestinal hemorrhage, unspecified gastrointestinal hemorrhage type [K92.2] Patient Active Problem List   Diagnosis Date Noted  . Anemia 06/04/2019  . Frequent falls 06/04/2019  . History of CVA (cerebrovascular accident) 06/04/2019  . HCAP (healthcare-associated pneumonia)  06/04/2019  . Acute blood loss anemia 06/04/2019  . Hyperglycemia 05/28/2019  . Fall at home, initial encounter 05/28/2019  . Rib fracture 05/28/2019  . Depression 05/28/2019  . Hypokalemia 05/28/2019  . Weakness 05/27/2019  . ESRD on hemodialysis ([place])   . Acute renal failure (ARF) ([place]) 04/09/2019  . Polyp of  ascending colon   . Diarrhea   . AKI (acute kidney injury) ([place]) 03/04/2019  . Acute kidney injury ([place]) 07/26/2018  . ARF (acute renal failure) ([place]) 07/25/2018  . CVA (cerebral vascular accident) ([place]) 03/17/2018  . UTI (urinary tract infection) 02/27/2018  . Hypoglycemia 01/15/2018  . Type 2 diabetes mellitus without complication, with long-term current use of insulin ([place]) 08/27/2017  . Protein-calorie malnutrition, severe 08/19/2017  . Pancreatitis, acute 08/16/2017  . DKA (diabetic ketoacidoses) ([place]) 08/16/2017  . HTN (hypertension) 08/16/2017  . HLD (hyperlipidemia) 08/16/2017  . Carotid stenosis 08/02/2016  . GERD (gastroesophageal reflux disease) 08/02/2016  . History of esophagogastroduodenoscopy (EGD) 07/01/2016  . History of recent blood transfusion 07/01/2016  . Acute renal failure superimposed on stage 4 chronic kidney disease ([place]) 07/01/2016  . Hyponatremia 07/01/2016  . Uncontrolled diabetes mellitus ([place]) 07/01/2016  . Alcohol abuse 07/01/2016  . Monilial esophagitis ([place]) 07/01/2016  . Tobacco abuse 07/01/2016  . Confusion 06/29/2016  . Iron deficiency anemia 06/29/2016  . Coagulopathy ([place]) 06/29/2016   PCP:  [name], MD Pharmacy:   [facility] [id number] - [address], [address] [place] [address] Phone: [phone] Fax: [phone]     Social Determinants of Health (SDOH) Interventions    Readmission Risk Interventions Readmission Risk Prevention Plan 06/05/2019  Transportation Screening Complete  PCP or Specialist Appt within 3-5 Days Complete  HRI or Home Care Consult Complete  Palliative Care Screening Not Applicable  Medication Review (RN Care Manager) Complete  Some recent data might be hidden

## 2019-06-05 NOTE — Progress Notes (Addendum)
Progress Note    [name]  [id number] DOB: 1961/01/28  DOA: 06/04/2019 PCP: [name], MD      Brief Narrative:    Medical records reviewed and are as summarized below:       Assessment/Plan:   Active Problems:   HTN (hypertension)   ESRD on hemodialysis ([place])   Anemia   Frequent falls   History of CVA (cerebrovascular accident)   HCAP (healthcare-associated pneumonia)   Acute blood loss anemia   Hyperosmolar hyperglycemic state (HHS) ([place])   Body mass index is 22.42 kg/m.    Pneumonia/pneumonitis/leukocytosis: Repeat chest x-ray today could not exclude pneumonitis. Continue empiric IV antibiotics.  End-stage renal disease with fluid overload/pulmonary edema: Follow-up with nephrologist for hemodialysis.  Type 2 diabetes mellitus with severe hyperglycemia/hyperosmolar hyperglycemic state: No evidence of DKA. Blood glucose went up to 775. Bolus of NovoLog was given and patient will be transferred to the ICU for IV insulin infusion.  Acute blood loss anemia: Hemoglobin improved status post transfusion with 1 unit of packed red blood cells.  Positive heme stools: Patient was evaluated by the gastroenterologist yesterday. Because of improvement in hemoglobin there is no immediate plan for endoscopic work-up. Of note, he had colonoscopy on 03/06/2019 which showed internal hemorrhoids and polyps that were removed.  Uncontrolled hypertension: Resume home antihypertensives.  Recurrent falls: PT and OT recommended home health therapy.  History of stroke: Aspirin on hold  CRITICAL CARE Performed by: Jennye Boroughs   Total critical care time: 38  minutes  Critical care time was exclusive of separately billable procedures and treating other patients.  Critical care was necessary to treat or prevent imminent or life-threatening deterioration.  Critical care was time spent personally by me on the following activities: development of treatment plan  with patient and/or surrogate as well as nursing, discussions with consultants, evaluation of patient's response to treatment, examination of patient, obtaining history from patient or surrogate, ordering and performing treatments and interventions, ordering and review of laboratory studies, ordering and review of radiographic studies, pulse oximetry and re-evaluation of patient's condition.   Family Communication/Anticipated D/C date and plan/Code Status   DVT prophylaxis: Heparin Code Status: Full code Family Communication: Plan discussed with the patient Disposition Plan: To be determined      Subjective:   Patient was seen this morning. His only complaint is occasional cough. No shortness of breath, chest pain, vomiting, diarrhea or abdominal pain.  Objective:    Vitals:   06/05/19 0502 06/05/19 1130 06/05/19 1729 06/05/19 1918  BP: (!) 171/88 (!) 185/95 (!) 166/76 (!) 173/86  Pulse: 74 83  75  Resp: 20   20  Temp: 98.5 F (36.9 C) 98.5 F (36.9 C)  98.1 F (36.7 C)  TempSrc: Oral Oral  Oral  SpO2: 96% 100%  97%  Weight:      Height:        Intake/Output Summary (Last 24 hours) at 06/05/2019 2150 Last data filed at 06/05/2019 1300 Gross per 24 hour  Intake 958.84 ml  Output 102 ml  Net 856.84 ml   Filed Weights   06/04/19 0200 06/04/19 1130 06/04/19 1200  Weight: 74.8 kg 75 kg 75 kg    Exam:  GEN: NAD SKIN: No rash EYES: EOMI ENT: MMM CV: RRR PULM: CTA B ABD: soft, ND, NT, +BS CNS: AAO x 3, non focal EXT: No edema or tenderness   Data Reviewed:   I have personally reviewed following labs and imaging studies:  Labs: Labs show the following:   Basic Metabolic Panel: Recent Labs  Lab 06/04/19 0138 06/05/19 1705 06/05/19 1750  NA 131*  --  132*  K 3.7  --  3.7  CL 95*  --  95*  CO2 26  --  24  GLUCOSE 122* 771* 765*  BUN 33*  --  21*  CREATININE 3.38*  --  2.80*  CALCIUM 8.2*  --  7.9*   GFR Estimated Creatinine Clearance: 30.1 mL/min  (A) (by C-G formula based on SCr of 2.8 mg/dL (H)). Liver Function Tests: Recent Labs  Lab 06/04/19 0138  AST 38  ALT 17  ALKPHOS 143*  BILITOT 0.8  PROT 5.5*  ALBUMIN 2.3*   Recent Labs  Lab 06/04/19 0138  LIPASE 33   No results for input(s): AMMONIA in the last 168 hours. Coagulation profile No results for input(s): INR, PROTIME in the last 168 hours.  CBC: Recent Labs  Lab 06/04/19 0138 06/04/19 1515 06/05/19 1750  WBC 16.9* 21.1* 12.6*  NEUTROABS 14.6*  --  10.2*  HGB 7.3* 9.1* 11.0*  HCT 22.8* 27.4* 34.5*  MCV 91.6 87.3 91.5  PLT 177 151 182   Cardiac Enzymes: No results for input(s): CKTOTAL, CKMB, CKMBINDEX, TROPONINI in the last 168 hours. BNP (last 3 results) No results for input(s): PROBNP in the last 8760 hours. CBG: Recent Labs  Lab 06/05/19 1648 06/05/19 1653 06/05/19 1815 06/05/19 1921  GLUCAP >600* >600* >600* >600*   D-Dimer: No results for input(s): DDIMER in the last 72 hours. Hgb A1c: No results for input(s): HGBA1C in the last 72 hours. Lipid Profile: No results for input(s): CHOL, HDL, LDLCALC, TRIG, CHOLHDL, LDLDIRECT in the last 72 hours. Thyroid function studies: No results for input(s): TSH, T4TOTAL, T3FREE, THYROIDAB in the last 72 hours.  Invalid input(s): FREET3 Anemia work up: Recent Labs    06/04/19 0338 06/04/19 1901  VITAMINB12  --  1,461*  FOLATE 21.4  --   TIBC 185*  --   IRON 8*  --    Sepsis Labs: Recent Labs  Lab 06/04/19 0138 06/04/19 0339 06/04/19 1515 06/05/19 1750  WBC 16.9*  --  21.1* 12.6*  LATICACIDVEN 2.5* 1.9  --   --     Microbiology Recent Results (from the past 240 hour(s))  SARS CORONAVIRUS 2 (TAT 6-24 HRS) Nasopharyngeal Nasopharyngeal Swab     Status: None   Collection Time: 05/28/19 11:09 AM   Specimen: Nasopharyngeal Swab  Result Value Ref Range Status   SARS Coronavirus 2 NEGATIVE NEGATIVE Final    Comment: (NOTE) SARS-CoV-2 target nucleic acids are NOT DETECTED. The  SARS-CoV-2 RNA is generally detectable in upper and lower respiratory specimens during the acute phase of infection. Negative results do not preclude SARS-CoV-2 infection, do not rule out co-infections with other pathogens, and should not be used as the sole basis for treatment or other patient management decisions. Negative results must be combined with clinical observations, patient history, and epidemiological information. The expected result is Negative. Fact Sheet for Patients: SugarRoll.be Fact Sheet for Healthcare Providers: https://www.woods-mathews.com/ This test is not yet approved or cleared by the [place] FDA and  has been authorized for detection and/or diagnosis of SARS-CoV-2 by FDA under an Emergency Use Authorization (EUA). This EUA will remain  in effect (meaning this test can be used) for the duration of the COVID-19 declaration under Section 56 4(b)(1) of the Act, 21 U.S.C. section 360bbb-3(b)(1), unless the authorization is terminated or revoked sooner. Performed at  [facility], [place] [address] [place], [place] [address]   Respiratory Panel by RT PCR (Flu A&B, Covid) - Nasopharyngeal Swab     Status: None   Collection Time: 06/04/19  1:44 AM   Specimen: Nasopharyngeal Swab  Result Value Ref Range Status   SARS Coronavirus 2 by RT PCR NEGATIVE NEGATIVE Final    Comment: (NOTE) SARS-CoV-2 target nucleic acids are NOT DETECTED. The SARS-CoV-2 RNA is generally detectable in upper respiratoy specimens during the acute phase of infection. The lowest concentration of SARS-CoV-2 viral copies this assay can detect is 131 copies/mL. A negative result does not preclude SARS-Cov-2 infection and should not be used as the sole basis for treatment or other patient management decisions. A negative result may occur with  improper specimen collection/handling, submission of specimen other than nasopharyngeal swab, presence of  viral mutation(s) within the areas targeted by this assay, and inadequate number of viral copies (<131 copies/mL). A negative result must be combined with clinical observations, patient history, and epidemiological information. The expected result is Negative. Fact Sheet for Patients:  PinkCheek.be Fact Sheet for Healthcare Providers:  GravelBags.it This test is not yet ap proved or cleared by the [place] FDA and  has been authorized for detection and/or diagnosis of SARS-CoV-2 by FDA under an Emergency Use Authorization (EUA). This EUA will remain  in effect (meaning this test can be used) for the duration of the COVID-19 declaration under Section 564(b)(1) of the Act, 21 U.S.C. section 360bbb-3(b)(1), unless the authorization is terminated or revoked sooner.    Influenza A by PCR NEGATIVE NEGATIVE Final   Influenza B by PCR NEGATIVE NEGATIVE Final    Comment: (NOTE) The Xpert Xpress SARS-CoV-2/FLU/RSV assay is intended as an aid in  the diagnosis of influenza from Nasopharyngeal swab specimens and  should not be used as a sole basis for treatment. Nasal washings and  aspirates are unacceptable for Xpert Xpress SARS-CoV-2/FLU/RSV  testing. Fact Sheet for Patients: PinkCheek.be Fact Sheet for Healthcare Providers: GravelBags.it This test is not yet approved or cleared by the [place] FDA and  has been authorized for detection and/or diagnosis of SARS-CoV-2 by  FDA under an Emergency Use Authorization (EUA). This EUA will remain  in effect (meaning this test can be used) for the duration of the  Covid-19 declaration under Section 564(b)(1) of the Act, 21  U.S.C. section 360bbb-3(b)(1), unless the authorization is  terminated or revoked. Performed at [facility], [address] [place], [place] [address]   Culture, blood (routine x 2)      Status: None (Preliminary result)   Collection Time: 06/04/19  3:39 AM   Specimen: BLOOD  Result Value Ref Range Status   Specimen Description BLOOD RIGHT [facility]  Final   Special Requests   Final    BOTTLES DRAWN AEROBIC AND ANAEROBIC Blood Culture results may not be optimal due to an inadequate volume of blood received in culture bottles   Culture   Final    NO GROWTH 1 DAY Performed at [facility], [address], [place], [place] [address]    Report Status PENDING  Incomplete  Culture, blood (routine x 2)     Status: None (Preliminary result)   Collection Time: 06/04/19  3:39 AM   Specimen: BLOOD  Result Value Ref Range Status   Specimen Description BLOOD LEFT FA  Final   Special Requests   Final    BOTTLES DRAWN AEROBIC AND ANAEROBIC Blood Culture adequate volume  Culture   Final    NO GROWTH 1 DAY Performed at [facility], [address] [place], [place] [address]    Report Status PENDING  Incomplete  MRSA PCR Screening     Status: None   Collection Time: 06/04/19  4:09 AM   Specimen: Nasal Mucosa; Nasopharyngeal  Result Value Ref Range Status   MRSA by PCR NEGATIVE NEGATIVE Final    Comment:        The GeneXpert MRSA Assay (FDA approved for NASAL specimens only), is one component of a comprehensive MRSA colonization surveillance program. It is not intended to diagnose MRSA infection nor to guide or monitor treatment for MRSA infections. Performed at [facility], [address] [place], [place] [address]     Procedures and diagnostic studies:  CT Head Wo Contrast  Result Date: 06/04/2019 CLINICAL DATA:  Encephalopathy EXAM: CT HEAD WITHOUT CONTRAST TECHNIQUE: Contiguous axial images were obtained from the base of the skull through the vertex without intravenous contrast. COMPARISON:  05/27/2019 FINDINGS: Brain: Unchanged area of encephalomalacia in the left frontal lobe. No intracranial hemorrhage or extra-axial collection. There is  periventricular hypoattenuation compatible with chronic microvascular disease. Mild generalized volume loss. Old right caudate head infarct. Vascular: Atherosclerotic calcification of the internal carotid arteries at the skull base. Skull: Normal Sinuses/Orbits: Paranasal sinuses clear aside from chronic opacification within the left frontal sinus. Mastoids free of fluid. The orbits are normal. Other: None IMPRESSION: 1. No acute intracranial abnormality. 2. Old left frontal lobe infarct and findings of chronic ischemic microangiopathy. Electronically Signed   By: Ulyses Jarred M.D.   On: 06/04/2019 03:11   DG Chest Port 1 View  Result Date: 06/05/2019 CLINICAL DATA:  Pneumonia. EXAM: PORTABLE CHEST 1 VIEW COMPARISON:  06/04/2019. FINDINGS: Dialysis catheter in stable position. Cardiomegaly with pulmonary venous congestion again noted without interim change. Mild bilateral interstitial prominence again noted without interim change. Findings suggest CHF. Pneumonitis cannot be excluded. No pleural effusion or pneumothorax. No acute bony abnormality. IMPRESSION: 1.  Dialysis catheter in stable position. 2. Cardiomegaly with pulmonary venous congestion and bilateral interstitial prominence again noted without interim change. Findings suggest CHF. Pneumonitis cannot be excluded. Electronically Signed   By: Marcello Moores  Register   On: 06/05/2019 05:30   DG Chest Port 1 View  Result Date: 06/04/2019 CLINICAL DATA:  Weakness. Altered mentation. EXAM: PORTABLE CHEST 1 VIEW COMPARISON:  Radiograph and CT 05/27/2019 FINDINGS: Right dialysis catheter tip in the atrial caval junction. Lower lung volumes from prior exam. Cardiomegaly with equivocal increase. No interstitial and bronchial thickening. Small right pleural effusion no focal airspace disease. No pneumothorax. Subacute right lower rib fractures. IMPRESSION: 1. Lower lung volumes from prior exam. Cardiomegaly with interstitial and bronchial thickening, suspect fluid  overload. 2. Small right pleural effusion. Electronically Signed   By: Keith Rake M.D.   On: 06/04/2019 02:02    Medications:   . amLODipine  10 mg Oral Daily  . Chlorhexidine Gluconate Cloth  6 each Topical Q0600  . folic acid  1 mg Oral Daily  . heparin  5,000 Units Subcutaneous Q8H  . metoprolol tartrate  50 mg Oral BID  . pantoprazole  40 mg Oral Daily  . [START ON 06/06/2019] sertraline  100 mg Oral Daily   Continuous Infusions: . azithromycin Stopped (06/05/19 0743)  . cefTRIAXone (ROCEPHIN)  IV Stopped (06/05/19 0539)  . insulin       LOS: 1 day   Tristen Luce  [facility]   *  Please refer to [email or web address], password [email or web address] to get updated schedule on who will round on this patient, as hospitalists switch teams weekly. If 7PM-7AM, please contact night-coverage at [email or web address], password [email or web address] for any overnight needs.  06/05/2019, 9:50 PM

## 2019-06-05 NOTE — Progress Notes (Signed)
Called Dr. Mal Misty with critical glucose of 771.  He gave me a verbal order for another 10 units of novolog.  Patient has orders to transfer to ICU for insulin drip.

## 2019-06-05 NOTE — Progress Notes (Signed)
ICU accepted bed request and report was called and given to [facility] with all questions answered. Pt chart, medication, and all belongings sent with pt to ICU.

## 2019-06-05 NOTE — Progress Notes (Signed)
Called Dr. Mal Misty about patient's FSBS being >600.  He put in orders for labs to be drawn and said he was also going to order insulin for him.  I also asked him about starting the patient back on his oral BP medications and he gave me a verbal order for the Norvasc and Metoprolol.

## 2019-06-05 NOTE — Evaluation (Signed)
Physical Therapy Evaluation Patient Details Name: [name] MRN: [id number] DOB: 1961/02/03 Today's Date: 06/05/2019   History of Present Illness  Pt is 59 y/o M with PMH ESRD on HD TTS, T2DM, ETOH abuse, HLD, HTN, and stroke with some residual R sided weakness. Pt presented with concern for recurrent falls. Found to be hypokalemic.  Clinical Impression  Pt admitted with above diagnosis. Pt currently with functional limitations due to the deficits listed below (see "PT Problem List"). Upon entry, pt in bed, awake and agreeable to participate. Pt familiar to author from prior PT services. The pt is alert and oriented x3, pleasant, conversational, and generally a fair historian. Pt HTN most of day, 170s/80s supine this visit, lower in sitting. No assist needed to come to EOB and rise to standing, but pt has dizziness in standing and requires some support for side stepping. Pt is fairly weak and fatigued and declines AMB at this time. Functional mobility assessment demonstrates increased effort/time requirements, poor tolerance, and need for physical assistance, whereas the patient performed these at a higher level of independence PTA. Pt typically moving much better, although he has a lengthy falls history, he does endorse gross weakness now for the last week or so. Pt will benefit from skilled PT intervention to increase independence and safety with basic mobility in preparation for discharge to the venue listed below.       Follow Up Recommendations Home health PT    Equipment Recommendations  None recommended by PT    Recommendations for Other Services       Precautions / Restrictions Precautions Precautions: Fall Restrictions Weight Bearing Restrictions: No      Mobility  Bed Mobility Overal bed mobility: Needs Assistance Bed Mobility: Supine to Sit;Sit to Supine     Supine to sit: Supervision;Modified independent (Device/Increase time) Sit to supine: Modified independent  (Device/Increase time);Supervision   General bed mobility comments: extended time  Transfers Overall transfer level: Needs assistance Equipment used: None Transfers: Sit to/from Stand Sit to Stand: Min guard         General transfer comment: able to standing with fair balance, but struggles to side step at bedside without UE support  Ambulation/Gait Ambulation/Gait assistance: (deferred, pt feelign poorly)              Stairs            Wheelchair Mobility    Modified Rankin (Stroke Patients Only)       Balance Overall balance assessment: Needs assistance Sitting-balance support: No upper extremity supported Sitting balance-Leahy Scale: Good     Standing balance support: Bilateral upper extremity supported Standing balance-Leahy Scale: Fair Standing balance comment: reliant on RW                             Pertinent Vitals/Pain Pain Assessment: No/denies pain    Home Living Family/patient expects to be discharged to:: Private residence Living Arrangements: Spouse/significant other Available Help at Discharge: Family;Available PRN/intermittently Type of Home: House Home Access: Level entry     Home Layout: One level Home Equipment: Grab bars - tub/shower;Grab bars - toilet;Cane - single point;Walker - 2 wheels      Prior Function Level of Independence: Independent with assistive device(s)         Comments: pt reports independent with all ADLS/IADLs, reports using a SPC for ambulation. States wife does have to help him some with bathing/dressing on HD days  Hand Dominance   Dominant Hand: Left    Extremity/Trunk Assessment   Upper Extremity Assessment Upper Extremity Assessment: Generalized weakness;RUE deficits/detail;LUE deficits/detail RUE Deficits / Details: shoulder 3-/5, elbow 3+/5, grip 4-/5 LUE Deficits / Details: shoulder, elbow, grip 4/5    Lower Extremity Assessment Lower Extremity Assessment: Defer to PT  evaluation;Overall [facility] for tasks assessed;Generalized weakness       Communication   Communication: No difficulties  Cognition Arousal/Alertness: Awake/alert Behavior During Therapy: WFL for tasks assessed/performed Overall Cognitive Status: Impaired/Different from baseline                                 General Comments: Pt somewhat confused, states correct month, but states year is 2002 and does not know day/date. Knows he is in the hospital, but isn't sure which one, but does eventually state that he is in [place]. Pt is aware that he is somewhat disoriented. Pt follows all commands appropriately.      General Comments      Exercises Other Exercises Other Exercises: OT facilitates education re: d/c recommendation Other Exercises: OT facilitates education re: importance of OOB activity   Assessment/Plan    PT Assessment Patient needs continued PT services  PT Problem List Decreased strength;Decreased range of motion;Decreased activity tolerance;Decreased balance;Decreased mobility;Decreased knowledge of use of DME;Decreased knowledge of precautions;Decreased safety awareness;Cardiopulmonary status limiting activity;Pain       PT Treatment Interventions DME instruction;Stair training;Functional mobility training;Therapeutic activities;Therapeutic exercise;Balance training;Neuromuscular re-education;Patient/family education    PT Goals (Current goals can be found in the Care Plan section)  Acute Rehab PT Goals Patient Stated Goal: regain strength, stop falling PT Goal Formulation: With patient Time For Goal Achievement: 06/19/19 Potential to Achieve Goals: Good    Frequency Min 2X/week   Barriers to discharge        Co-evaluation               AM-PAC PT "6 Clicks" Mobility  Outcome Measure Help needed turning from your back to your side while in a flat bed without using bedrails?: None Help needed moving from lying on your back to sitting on  the side of a flat bed without using bedrails?: None Help needed moving to and from a bed to a chair (including a wheelchair)?: A Little Help needed standing up from a chair using your arms (e.g., wheelchair or bedside chair)?: A Little Help needed to walk in hospital room?: A Little Help needed climbing 3-5 steps with a railing? : A Little 6 Click Score: 20    End of Session Equipment Utilized During Treatment: Gait belt Activity Tolerance: Patient tolerated treatment well;Patient limited by fatigue Patient left: with call bell/phone within reach;in bed;with bed alarm set Nurse Communication: Mobility status PT Visit Diagnosis: Muscle weakness (generalized) (M62.81);Repeated falls (R29.6);Difficulty in walking, not elsewhere classified (R26.2)    Time: [id number] PT Time Calculation (min) (ACUTE ONLY): 14 min   Charges:   PT Evaluation $PT Eval Low Complexity: 1 Low          4:40 PM, 06/05/19 Etta Grandchild, PT, DPT Physical Therapist - [facility]  [phone] ([place])    Eytan Carrigan C 06/05/2019, 4:38 PM

## 2019-06-05 NOTE — Evaluation (Signed)
Occupational Therapy Evaluation Patient Details Name: [name] MRN: [id number] DOB: July 03, 1960 Today's Date: 06/05/2019    History of Present Illness Pt is 59 y/o M with PMH ESRD on HD TTS, T2DM, ETOH abuse, HLD, HTN, and stroke with some residual R sided weakness. Pt presented with concern for recurrent falls. Found to be hypokalemic.   Clinical Impression   Pt was seen for OT evaluation this date. Prior to hospital admission, pt reports being mostly Indep with self care, states wife has to assist sometimes on HD days and drives pt. Pt lives in [place] with spouse with level entry. Currently pt demonstrates impairments as described below (See OT problem list) which functionally limit his ability to perform ADL/self-care tasks. Pt currently requires MIN A for LB ADLs in sitting, MIN/MOD A with ADL transfers for safety/balance, demos F static standing balance with CGA.  Pt would benefit from skilled OT to address noted impairments and functional limitations (see below for any additional details) in order to maximize safety and independence while minimizing falls risk and caregiver burden. Upon hospital discharge, recommend STR to improve pt's tolerance and strength as it pertains to performance with ADLs/ADL mobility. Will continue to assess.    Follow Up Recommendations  SNF    Equipment Recommendations  3 in 1 bedside commode    Recommendations for Other Services       Precautions / Restrictions Precautions Precautions: Fall Restrictions Weight Bearing Restrictions: No      Mobility Bed Mobility Overal bed mobility: Needs Assistance Bed Mobility: Supine to Sit;Sit to Supine     Supine to sit: Min guard Sit to supine: Min guard   General bed mobility comments: extended time  Transfers Overall transfer level: Needs assistance Equipment used: Rolling walker (2 wheeled) Transfers: Sit to/from Stand Sit to Stand: Min assist;Mod assist              Balance Overall  balance assessment: Needs assistance Sitting-balance support: No upper extremity supported Sitting balance-Leahy Scale: Good     Standing balance support: Bilateral upper extremity supported Standing balance-Leahy Scale: Fair Standing balance comment: reliant on RW                           ADL either performed or assessed with clinical judgement   ADL                                         General ADL Comments: Pt requires setup for UB ADLs and extended time as he is weak and fatigues easily. MIN A for LB ADLs while seated EOB, MIN/MOD A for sit<>stand transfer trial. Pt able to take 4 side steps to his L towared HOB, requires verbal cues to control stand to sit.     Vision Patient Visual Report: No change from baseline       Perception     Praxis      Pertinent Vitals/Pain Pain Assessment: No/denies pain     Hand Dominance Left   Extremity/Trunk Assessment Upper Extremity Assessment Upper Extremity Assessment: Generalized weakness;RUE deficits/detail;LUE deficits/detail RUE Deficits / Details: shoulder 3-/5, elbow 3+/5, grip 4-/5 LUE Deficits / Details: shoulder, elbow, grip 4/5   Lower Extremity Assessment Lower Extremity Assessment: Defer to PT evaluation;Overall [facility] for tasks assessed;Generalized weakness       Communication Communication Communication: No difficulties   Cognition  Arousal/Alertness: Awake/alert Behavior During Therapy: WFL for tasks assessed/performed Overall Cognitive Status: Difficult to assess                                 General Comments: Pt somewhat confused, states correct month, but states year is 2002 and does not know day/date. Knows he is in the hospital, but isn't sure which one, but does eventually state that he is in [place]. Pt is aware that he is somewhat disoriented. Pt follows all commands appropriately.   General Comments       Exercises Other Exercises Other Exercises: OT  facilitates education re: d/c recommendation Other Exercises: OT facilitates education re: importance of OOB activity   Shoulder Instructions      Home Living Family/patient expects to be discharged to:: Private residence Living Arrangements: Spouse/significant other Available Help at Discharge: Family;Available PRN/intermittently Type of Home: House Home Access: Level entry     Home Layout: One level     Bathroom Shower/Tub: Occupational psychologist: Handicapped height Bathroom Accessibility: Yes   Home Equipment: Grab bars - tub/shower;Grab bars - toilet;Cane - single point;Walker - 2 wheels          Prior Functioning/Environment Level of Independence: Independent with assistive device(s)        Comments: pt reports independent with all ADLS/IADLs, reports using a SPC for ambulation. States wife does have to help him some with bathing/dressing on HD days        OT Problem List: Decreased strength;Decreased range of motion;Decreased activity tolerance;Impaired balance (sitting and/or standing)      OT Treatment/Interventions: Self-care/ADL training;Therapeutic exercise;Energy conservation;DME and/or AE instruction;Therapeutic activities;Patient/family education;Balance training    OT Goals(Current goals can be found in the care plan section) Acute Rehab OT Goals Patient Stated Goal: Go home OT Goal Formulation: With patient Time For Goal Achievement: 06/18/19 Potential to Achieve Goals: Good  OT Frequency: Min 1X/week   Barriers to D/C:            Co-evaluation              AM-PAC OT "6 Clicks" Daily Activity     Outcome Measure Help from another person eating meals?: None Help from another person taking care of personal grooming?: A Little Help from another person toileting, which includes using toliet, bedpan, or urinal?: A Little Help from another person bathing (including washing, rinsing, drying)?: A Lot Help from another person to put  on and taking off regular upper body clothing?: None Help from another person to put on and taking off regular lower body clothing?: A Little 6 Click Score: 19   End of Session Equipment Utilized During Treatment: Gait belt;Rolling walker Nurse Communication: Mobility status  Activity Tolerance: Patient tolerated treatment well Patient left: in bed;with call bell/phone within reach;with bed alarm set  OT Visit Diagnosis: Unsteadiness on feet (R26.81);Muscle weakness (generalized) (M62.81)                Time: [id number] OT Time Calculation (min): 25 min Charges:  OT General Charges $OT Visit: 1 Visit OT Evaluation $OT Eval Moderate Complexity: 1 Mod OT Treatments $Self Care/Home Management : 8-22 mins  Gerrianne Scale, MS, OTR/L ascom [phone] 06/05/19, 4:33 PM

## 2019-06-05 NOTE — Progress Notes (Signed)
[name], [place] 06/05/19  Subjective:   Hospital day # 1  Patient known to our practice from outpatient dialysis and previous admissions This time admitted for concerns of recurrent falls No acute complaints States he ate this morning Denies nausea or vomiting   Renal: 01/21 0701 - 01/22 0700 In: -  Out: 1002 [Stool:2] Lab Results  Component Value Date   CREATININE 3.38 (H) 06/04/2019   CREATININE 2.42 (H) 05/29/2019   CREATININE 1.97 (H) 05/28/2019     Objective:  Vital signs in last 24 hours:  Temp:  [98.1 F (36.7 C)-98.5 F (36.9 C)] 98.5 F (36.9 C) (01/22 1130) Pulse Rate:  [63-83] 83 (01/22 1130) Resp:  [16-20] 20 (01/22 0502) BP: (151-185)/(80-95) 185/95 (01/22 1130) SpO2:  [96 %-100 %] 100 % (01/22 1130)  Weight change: 68.2 kg Filed Weights   06/04/19 0200 06/04/19 1130 06/04/19 1200  Weight: 74.8 kg 75 kg 75 kg    Intake/Output:    Intake/Output Summary (Last 24 hours) at 06/05/2019 1350 Last data filed at 06/05/2019 1300 Gross per 24 hour  Intake 958.84 ml  Output 1102 ml  Net -143.16 ml     Physical Exam: General:  Thin, no acute distress  HEENT  moist oral mucous membranes  Pulm/lungs  clear to auscultation, room air  CVS/Heart  irregular rhythm, no rub  Abdomen:   Soft, nontender  Extremities:  No peripheral edema  Neurologic:  Alert, oriented to self, able to answer few questions  Skin:  No acute rashes  Access:  Right IJ PermCath       Basic Metabolic Panel:  Recent Labs  Lab 06/04/19 0138  NA 131*  K 3.7  CL 95*  CO2 26  GLUCOSE 122*  BUN 33*  CREATININE 3.38*  CALCIUM 8.2*     CBC: Recent Labs  Lab 06/04/19 0138 06/04/19 1515  WBC 16.9* 21.1*  NEUTROABS 14.6*  --   HGB 7.3* 9.1*  HCT 22.8* 27.4*  MCV 91.6 87.3  PLT 177 151      Lab Results  Component Value Date   HEPBSAG NON REACTIVE 04/11/2019   HEPBSAB NON REACTIVE 04/10/2019      Microbiology:  Recent  Results (from the past 240 hour(s))  SARS CORONAVIRUS 2 (TAT 6-24 HRS) Nasopharyngeal Nasopharyngeal Swab     Status: None   Collection Time: 05/28/19 11:09 AM   Specimen: Nasopharyngeal Swab  Result Value Ref Range Status   SARS Coronavirus 2 NEGATIVE NEGATIVE Final    Comment: (NOTE) SARS-CoV-2 target nucleic acids are NOT DETECTED. The SARS-CoV-2 RNA is generally detectable in upper and lower respiratory specimens during the acute phase of infection. Negative results do not preclude SARS-CoV-2 infection, do not rule out co-infections with other pathogens, and should not be used as the sole basis for treatment or other patient management decisions. Negative results must be combined with clinical observations, patient history, and epidemiological information. The expected result is Negative. Fact Sheet for Patients: SugarRoll.be Fact Sheet for Healthcare Providers: https://www.woods-mathews.com/ This test is not yet approved or cleared by the [place] FDA and  has been authorized for detection and/or diagnosis of SARS-CoV-2 by FDA under an Emergency Use Authorization (EUA). This EUA will remain  in effect (meaning this test can be used) for the duration of the COVID-19 declaration under Section 56 4(b)(1) of the Act, 21 U.S.C. section 360bbb-3(b)(1), unless the authorization is terminated or revoked sooner. Performed at [facility], [place] [address],  [place], [place] [address]   Respiratory Panel by RT PCR (Flu A&B, Covid) - Nasopharyngeal Swab     Status: None   Collection Time: 06/04/19  1:44 AM   Specimen: Nasopharyngeal Swab  Result Value Ref Range Status   SARS Coronavirus 2 by RT PCR NEGATIVE NEGATIVE Final    Comment: (NOTE) SARS-CoV-2 target nucleic acids are NOT DETECTED. The SARS-CoV-2 RNA is generally detectable in upper respiratoy specimens during the acute phase of infection. The lowest concentration of SARS-CoV-2  viral copies this assay can detect is 131 copies/mL. A negative result does not preclude SARS-Cov-2 infection and should not be used as the sole basis for treatment or other patient management decisions. A negative result may occur with  improper specimen collection/handling, submission of specimen other than nasopharyngeal swab, presence of viral mutation(s) within the areas targeted by this assay, and inadequate number of viral copies (<131 copies/mL). A negative result must be combined with clinical observations, patient history, and epidemiological information. The expected result is Negative. Fact Sheet for Patients:  PinkCheek.be Fact Sheet for Healthcare Providers:  GravelBags.it This test is not yet ap proved or cleared by the [place] FDA and  has been authorized for detection and/or diagnosis of SARS-CoV-2 by FDA under an Emergency Use Authorization (EUA). This EUA will remain  in effect (meaning this test can be used) for the duration of the COVID-19 declaration under Section 564(b)(1) of the Act, 21 U.S.C. section 360bbb-3(b)(1), unless the authorization is terminated or revoked sooner.    Influenza A by PCR NEGATIVE NEGATIVE Final   Influenza B by PCR NEGATIVE NEGATIVE Final    Comment: (NOTE) The Xpert Xpress SARS-CoV-2/FLU/RSV assay is intended as an aid in  the diagnosis of influenza from Nasopharyngeal swab specimens and  should not be used as a sole basis for treatment. Nasal washings and  aspirates are unacceptable for Xpert Xpress SARS-CoV-2/FLU/RSV  testing. Fact Sheet for Patients: PinkCheek.be Fact Sheet for Healthcare Providers: GravelBags.it This test is not yet approved or cleared by the [place] FDA and  has been authorized for detection and/or diagnosis of SARS-CoV-2 by  FDA under an Emergency Use Authorization (EUA). This EUA will  remain  in effect (meaning this test can be used) for the duration of the  Covid-19 declaration under Section 564(b)(1) of the Act, 21  U.S.C. section 360bbb-3(b)(1), unless the authorization is  terminated or revoked. Performed at [facility], [address] [place], [place] [address]   Culture, blood (routine x 2)     Status: None (Preliminary result)   Collection Time: 06/04/19  3:39 AM   Specimen: BLOOD  Result Value Ref Range Status   Specimen Description BLOOD RIGHT [facility]  Final   Special Requests   Final    BOTTLES DRAWN AEROBIC AND ANAEROBIC Blood Culture results may not be optimal due to an inadequate volume of blood received in culture bottles   Culture   Final    NO GROWTH 1 DAY Performed at [facility], [address] [place],  [address]    Report Status PENDING  Incomplete  Culture, blood (routine x 2)     Status: None (Preliminary result)   Collection Time: 06/04/19  3:39 AM   Specimen: BLOOD  Result Value Ref Range Status   Specimen Description BLOOD LEFT FA  Final   Special Requests   Final    BOTTLES DRAWN AEROBIC AND ANAEROBIC Blood Culture adequate volume   Culture   Final  NO GROWTH 1 DAY Performed at [facility], [address] [place], [place] [address]    Report Status PENDING  Incomplete  MRSA PCR Screening     Status: None   Collection Time: 06/04/19  4:09 AM   Specimen: Nasal Mucosa; Nasopharyngeal  Result Value Ref Range Status   MRSA by PCR NEGATIVE NEGATIVE Final    Comment:        The GeneXpert MRSA Assay (FDA approved for NASAL specimens only), is one component of a comprehensive MRSA colonization surveillance program. It is not intended to diagnose MRSA infection nor to guide or monitor treatment for MRSA infections. Performed at [facility], [address] [place], [place] [address]     Coagulation Studies: No results for input(s): LABPROT, INR in the last 72  hours.  Urinalysis: No results for input(s): COLORURINE, LABSPEC, PHURINE, GLUCOSEU, HGBUR, BILIRUBINUR, KETONESUR, PROTEINUR, UROBILINOGEN, NITRITE, LEUKOCYTESUR in the last 72 hours.  Invalid input(s): APPERANCEUR    Imaging: CT Head Wo Contrast  Result Date: 06/04/2019 CLINICAL DATA:  Encephalopathy EXAM: CT HEAD WITHOUT CONTRAST TECHNIQUE: Contiguous axial images were obtained from the base of the skull through the vertex without intravenous contrast. COMPARISON:  05/27/2019 FINDINGS: Brain: Unchanged area of encephalomalacia in the left frontal lobe. No intracranial hemorrhage or extra-axial collection. There is periventricular hypoattenuation compatible with chronic microvascular disease. Mild generalized volume loss. Old right caudate head infarct. Vascular: Atherosclerotic calcification of the internal carotid arteries at the skull base. Skull: Normal Sinuses/Orbits: Paranasal sinuses clear aside from chronic opacification within the left frontal sinus. Mastoids free of fluid. The orbits are normal. Other: None IMPRESSION: 1. No acute intracranial abnormality. 2. Old left frontal lobe infarct and findings of chronic ischemic microangiopathy. Electronically Signed   By: Ulyses Jarred M.D.   On: 06/04/2019 03:11   DG Chest Port 1 View  Result Date: 06/05/2019 CLINICAL DATA:  Pneumonia. EXAM: PORTABLE CHEST 1 VIEW COMPARISON:  06/04/2019. FINDINGS: Dialysis catheter in stable position. Cardiomegaly with pulmonary venous congestion again noted without interim change. Mild bilateral interstitial prominence again noted without interim change. Findings suggest CHF. Pneumonitis cannot be excluded. No pleural effusion or pneumothorax. No acute bony abnormality. IMPRESSION: 1.  Dialysis catheter in stable position. 2. Cardiomegaly with pulmonary venous congestion and bilateral interstitial prominence again noted without interim change. Findings suggest CHF. Pneumonitis cannot be excluded. Electronically  Signed   By: Marcello Moores  Register   On: 06/05/2019 05:30   DG Chest Port 1 View  Result Date: 06/04/2019 CLINICAL DATA:  Weakness. Altered mentation. EXAM: PORTABLE CHEST 1 VIEW COMPARISON:  Radiograph and CT 05/27/2019 FINDINGS: Right dialysis catheter tip in the atrial caval junction. Lower lung volumes from prior exam. Cardiomegaly with equivocal increase. No interstitial and bronchial thickening. Small right pleural effusion no focal airspace disease. No pneumothorax. Subacute right lower rib fractures. IMPRESSION: 1. Lower lung volumes from prior exam. Cardiomegaly with interstitial and bronchial thickening, suspect fluid overload. 2. Small right pleural effusion. Electronically Signed   By: Keith Rake M.D.   On: 06/04/2019 02:02     Medications:   . azithromycin Stopped (06/05/19 0743)  . cefTRIAXone (ROCEPHIN)  IV Stopped (06/05/19 0539)   . Chlorhexidine Gluconate Cloth  6 each Topical Q0600  . heparin  5,000 Units Subcutaneous Q8H  . insulin aspart  0-5 Units Subcutaneous QHS  . insulin aspart  0-9 Units Subcutaneous TID WC     Assessment/ Plan:  59 y.o. male with type 2 diabetes, hypertension admitted on  06/04/2019 for Acute blood loss anemia [D62] Pneumonia [J18.9] HCAP (healthcare-associated pneumonia) [J18.9] Elevated lactic acid level [R79.89] Altered mental status, unspecified altered mental status type [R41.82] Gastrointestinal hemorrhage, unspecified gastrointestinal hemorrhage type [K92.2]  [facility] TTS [address] RIJ permcath   1.  End-stage renal disease Continue scheduled dialysis on Tuesday, Thursday, Saturday schedule Next dialysis will be scheduled for Saturday Orders prepared  2.  Anemia of chronic kidney disease Continue Epogen with dialysis Lab Results  Component Value Date   HGB 9.1 (L) 06/04/2019  Presenting hemoglobin 7.3.  Received blood transfusion.  3.  Secondary hyperparathyroidism Lab Results  Component Value Date   CALCIUM 8.2 (L)  06/04/2019   PHOS 2.3 (L) 05/27/2019  Low phosphorus, likely due to poor oral intake  4.  Currently being treated empirically for healthcare associated pneumonia with azithromycin and ceftriaxone      LOS: [place] 1/22/20211:50 PM  [place], [place]  Note: This note was prepared with Dragon dictation. Any transcription errors are unintentional

## 2019-06-06 ENCOUNTER — Encounter: Payer: Self-pay | Admitting: Internal Medicine

## 2019-06-06 LAB — CBC WITH DIFFERENTIAL/PLATELET
Abs Immature Granulocytes: 0.03 10*3/uL (ref 0.00–0.07)
Basophils Absolute: 0 10*3/uL (ref 0.0–0.1)
Basophils Relative: 0 %
Eosinophils Absolute: 0.2 10*3/uL (ref 0.0–0.5)
Eosinophils Relative: 2 %
HCT: 31.4 % — ABNORMAL LOW (ref 39.0–52.0)
Hemoglobin: 10.2 g/dL — ABNORMAL LOW (ref 13.0–17.0)
Immature Granulocytes: 0 %
Lymphocytes Relative: 20 %
Lymphs Abs: 1.8 10*3/uL (ref 0.7–4.0)
MCH: 28.7 pg (ref 26.0–34.0)
MCHC: 32.5 g/dL (ref 30.0–36.0)
MCV: 88.2 fL (ref 80.0–100.0)
Monocytes Absolute: 0.5 10*3/uL (ref 0.1–1.0)
Monocytes Relative: 5 %
Neutro Abs: 6.6 10*3/uL (ref 1.7–7.7)
Neutrophils Relative %: 73 %
Platelets: 172 10*3/uL (ref 150–400)
RBC: 3.56 MIL/uL — ABNORMAL LOW (ref 4.22–5.81)
RDW: 14.5 % (ref 11.5–15.5)
WBC: 9.1 10*3/uL (ref 4.0–10.5)
nRBC: 0 % (ref 0.0–0.2)

## 2019-06-06 LAB — GLUCOSE, CAPILLARY
Glucose-Capillary: 109 mg/dL — ABNORMAL HIGH (ref 70–99)
Glucose-Capillary: 110 mg/dL — ABNORMAL HIGH (ref 70–99)
Glucose-Capillary: 116 mg/dL — ABNORMAL HIGH (ref 70–99)
Glucose-Capillary: 157 mg/dL — ABNORMAL HIGH (ref 70–99)
Glucose-Capillary: 235 mg/dL — ABNORMAL HIGH (ref 70–99)
Glucose-Capillary: 291 mg/dL — ABNORMAL HIGH (ref 70–99)
Glucose-Capillary: 306 mg/dL — ABNORMAL HIGH (ref 70–99)
Glucose-Capillary: 346 mg/dL — ABNORMAL HIGH (ref 70–99)
Glucose-Capillary: 42 mg/dL — CL (ref 70–99)
Glucose-Capillary: 48 mg/dL — ABNORMAL LOW (ref 70–99)
Glucose-Capillary: 60 mg/dL — ABNORMAL LOW (ref 70–99)
Glucose-Capillary: 77 mg/dL (ref 70–99)
Glucose-Capillary: 95 mg/dL (ref 70–99)

## 2019-06-06 LAB — BASIC METABOLIC PANEL
Anion gap: 8 (ref 5–15)
BUN: 19 mg/dL (ref 6–20)
CO2: 28 mmol/L (ref 22–32)
Calcium: 7.6 mg/dL — ABNORMAL LOW (ref 8.9–10.3)
Chloride: 101 mmol/L (ref 98–111)
Creatinine, Ser: 2.83 mg/dL — ABNORMAL HIGH (ref 0.61–1.24)
GFR calc Af Amer: 27 mL/min — ABNORMAL LOW (ref >60)
GFR calc non Af Amer: 23 mL/min — ABNORMAL LOW (ref >60)
Glucose, Bld: 337 mg/dL — ABNORMAL HIGH (ref 70–99)
Potassium: 2.8 mmol/L — ABNORMAL LOW (ref 3.5–5.1)
Sodium: 137 mmol/L (ref 135–145)

## 2019-06-06 MED ORDER — AMOXICILLIN-POT CLAVULANATE 500-125 MG PO TABS
500.0000 mg | ORAL_TABLET | Freq: Two times a day (BID) | ORAL | Status: DC
  Administered 2019-06-07 – 2019-06-08 (×3): 500 mg via ORAL
  Filled 2019-06-06 (×4): qty 1

## 2019-06-06 MED ORDER — INSULIN ASPART 100 UNIT/ML ~~LOC~~ SOLN: 0.0000 [IU] | Freq: Three times a day (TID) | SUBCUTANEOUS | Status: DC

## 2019-06-06 MED ORDER — INSULIN ASPART 100 UNIT/ML ~~LOC~~ SOLN
0.0000 [IU] | Freq: Three times a day (TID) | SUBCUTANEOUS | Status: DC
  Administered 2019-06-06: 8 [IU] via SUBCUTANEOUS
  Administered 2019-06-06: 11 [IU] via SUBCUTANEOUS
  Administered 2019-06-07: 2 [IU] via SUBCUTANEOUS
  Administered 2019-06-07: 3 [IU] via SUBCUTANEOUS
  Administered 2019-06-08: 5 [IU] via SUBCUTANEOUS
  Administered 2019-06-09: 2 [IU] via SUBCUTANEOUS
  Filled 2019-06-06 (×6): qty 1

## 2019-06-06 MED ORDER — INSULIN GLARGINE 100 UNIT/ML ~~LOC~~ SOLN
30.0000 [IU] | Freq: Every day | SUBCUTANEOUS | Status: DC
  Administered 2019-06-06: 30 [IU] via SUBCUTANEOUS
  Filled 2019-06-06 (×2): qty 0.3

## 2019-06-06 MED ORDER — INSULIN GLARGINE 100 UNIT/ML ~~LOC~~ SOLN
15.0000 [IU] | Freq: Every day | SUBCUTANEOUS | Status: DC
  Filled 2019-06-06: qty 0.15

## 2019-06-06 NOTE — Progress Notes (Signed)
[place], [place] 06/06/19  Subjective:  Patient seen and evaluated during HD. Tolerating well. BFR 350, UF target 1kg.   Objective:  Vital signs in last 24 hours:  Temp:  [97.4 F (36.3 C)-98.5 F (36.9 C)] 97.4 F (36.3 C) (01/23 0950) Pulse Rate:  [62-83] 72 (01/23 1030) Resp:  [9-20] 17 (01/23 1030) BP: (141-185)/(76-106) 141/101 (01/23 1030) SpO2:  [92 %-100 %] 100 % (01/23 0950)  Weight change:  Filed Weights   06/04/19 0200 06/04/19 1130 06/04/19 1200  Weight: 74.8 kg 75 kg 75 kg    Intake/Output:    Intake/Output Summary (Last 24 hours) at 06/06/2019 1036 Last data filed at 06/06/2019 1000 Gross per 24 hour  Intake 720 ml  Output 351 ml  Net 369 ml     Physical Exam: General:  Thin, no acute distress  HEENT  moist oral mucous membranes  Pulm/lungs  clear to auscultation, room air  CVS/Heart  irregular rhythm, no rub  Abdomen:   Soft, nontender  Extremities:  No peripheral edema  Neurologic:  Alert, awake, follows commands  Skin:  No acute rashes  Access:  Right IJ PermCath       Basic Metabolic Panel:  Recent Labs  Lab 06/04/19 0138 06/05/19 1705 06/05/19 1750 06/06/19 1002  NA 131*  --  132* 137  K 3.7  --  3.7 2.8*  CL 95*  --  95* 101  CO2 26  --  24 28  GLUCOSE 122* 771* 765* 337*  BUN 33*  --  21* 19  CREATININE 3.38*  --  2.80* 2.83*  CALCIUM 8.2*  --  7.9* 7.6*     CBC: Recent Labs  Lab 06/04/19 0138 06/04/19 1515 06/05/19 1750 06/06/19 1002  WBC 16.9* 21.1* 12.6* 9.1  NEUTROABS 14.6*  --  10.2* 6.6  HGB 7.3* 9.1* 11.0* 10.2*  HCT 22.8* 27.4* 34.5* 31.4*  MCV 91.6 87.3 91.5 88.2  PLT 177 151 182 172      Lab Results  Component Value Date   HEPBSAG NON REACTIVE 04/11/2019   HEPBSAB NON REACTIVE 04/10/2019      Microbiology:  Recent Results (from the past 240 hour(s))  SARS CORONAVIRUS 2 (TAT 6-24 HRS) Nasopharyngeal Nasopharyngeal Swab     Status: None   Collection Time:  05/28/19 11:09 AM   Specimen: Nasopharyngeal Swab  Result Value Ref Range Status   SARS Coronavirus 2 NEGATIVE NEGATIVE Final    Comment: (NOTE) SARS-CoV-2 target nucleic acids are NOT DETECTED. The SARS-CoV-2 RNA is generally detectable in upper and lower respiratory specimens during the acute phase of infection. Negative results do not preclude SARS-CoV-2 infection, do not rule out co-infections with other pathogens, and should not be used as the sole basis for treatment or other patient management decisions. Negative results must be combined with clinical observations, patient history, and epidemiological information. The expected result is Negative. Fact Sheet for Patients: SugarRoll.be Fact Sheet for Healthcare Providers: https://www.woods-mathews.com/ This test is not yet approved or cleared by the [place] FDA and  has been authorized for detection and/or diagnosis of SARS-CoV-2 by FDA under an Emergency Use Authorization (EUA). This EUA will remain  in effect (meaning this test can be used) for the duration of the COVID-19 declaration under Section 56 4(b)(1) of the Act, 21 U.S.C. section 360bbb-3(b)(1), unless the authorization is terminated or revoked sooner. Performed at [facility], [place] [address], [place], [place] [address]   Respiratory Panel by RT PCR (  Flu A&B, Covid) - Nasopharyngeal Swab     Status: None   Collection Time: 06/04/19  1:44 AM   Specimen: Nasopharyngeal Swab  Result Value Ref Range Status   SARS Coronavirus 2 by RT PCR NEGATIVE NEGATIVE Final    Comment: (NOTE) SARS-CoV-2 target nucleic acids are NOT DETECTED. The SARS-CoV-2 RNA is generally detectable in upper respiratoy specimens during the acute phase of infection. The lowest concentration of SARS-CoV-2 viral copies this assay can detect is 131 copies/mL. A negative result does not preclude SARS-Cov-2 infection and should not be used as the  sole basis for treatment or other patient management decisions. A negative result may occur with  improper specimen collection/handling, submission of specimen other than nasopharyngeal swab, presence of viral mutation(s) within the areas targeted by this assay, and inadequate number of viral copies (<131 copies/mL). A negative result must be combined with clinical observations, patient history, and epidemiological information. The expected result is Negative. Fact Sheet for Patients:  PinkCheek.be Fact Sheet for Healthcare Providers:  GravelBags.it This test is not yet ap proved or cleared by the [place] FDA and  has been authorized for detection and/or diagnosis of SARS-CoV-2 by FDA under an Emergency Use Authorization (EUA). This EUA will remain  in effect (meaning this test can be used) for the duration of the COVID-19 declaration under Section 564(b)(1) of the Act, 21 U.S.C. section 360bbb-3(b)(1), unless the authorization is terminated or revoked sooner.    Influenza A by PCR NEGATIVE NEGATIVE Final   Influenza B by PCR NEGATIVE NEGATIVE Final    Comment: (NOTE) The Xpert Xpress SARS-CoV-2/FLU/RSV assay is intended as an aid in  the diagnosis of influenza from Nasopharyngeal swab specimens and  should not be used as a sole basis for treatment. Nasal washings and  aspirates are unacceptable for Xpert Xpress SARS-CoV-2/FLU/RSV  testing. Fact Sheet for Patients: PinkCheek.be Fact Sheet for Healthcare Providers: GravelBags.it This test is not yet approved or cleared by the [place] FDA and  has been authorized for detection and/or diagnosis of SARS-CoV-2 by  FDA under an Emergency Use Authorization (EUA). This EUA will remain  in effect (meaning this test can be used) for the duration of the  Covid-19 declaration under Section 564(b)(1) of the Act, 21   U.S.C. section 360bbb-3(b)(1), unless the authorization is  terminated or revoked. Performed at [facility], [address] [place], [place] [address]   Culture, blood (routine x 2)     Status: None (Preliminary result)   Collection Time: 06/04/19  3:39 AM   Specimen: BLOOD  Result Value Ref Range Status   Specimen Description BLOOD RIGHT [facility]  Final   Special Requests   Final    BOTTLES DRAWN AEROBIC AND ANAEROBIC Blood Culture results may not be optimal due to an inadequate volume of blood received in culture bottles   Culture   Final    NO GROWTH 2 DAYS Performed at [facility], [address], [place], [place] [address]    Report Status PENDING  Incomplete  Culture, blood (routine x 2)     Status: None (Preliminary result)   Collection Time: 06/04/19  3:39 AM   Specimen: BLOOD  Result Value Ref Range Status   Specimen Description BLOOD LEFT FA  Final   Special Requests   Final    BOTTLES DRAWN AEROBIC AND ANAEROBIC Blood Culture adequate volume   Culture   Final    NO GROWTH 2 DAYS Performed at [facility],  [place], [place] [address]    Report Status PENDING  Incomplete  MRSA PCR Screening     Status: None   Collection Time: 06/04/19  4:09 AM   Specimen: Nasal Mucosa; Nasopharyngeal  Result Value Ref Range Status   MRSA by PCR NEGATIVE NEGATIVE Final    Comment:        The GeneXpert MRSA Assay (FDA approved for NASAL specimens only), is one component of a comprehensive MRSA colonization surveillance program. It is not intended to diagnose MRSA infection nor to guide or monitor treatment for MRSA infections. Performed at [facility], [address] [place], [place] [address]     Coagulation Studies: No results for input(s): LABPROT, INR in the last 72 hours.  Urinalysis: Recent Labs    06/05/19 1300  COLORURINE YELLOW*  LABSPEC 1.015  PHURINE 6.0  GLUCOSEU >=500*  HGBUR SMALL*  BILIRUBINUR  NEGATIVE  KETONESUR NEGATIVE  PROTEINUR 100*  NITRITE NEGATIVE  LEUKOCYTESUR NEGATIVE      Imaging: DG Chest Port 1 View  Result Date: 06/05/2019 CLINICAL DATA:  Pneumonia. EXAM: PORTABLE CHEST 1 VIEW COMPARISON:  06/04/2019. FINDINGS: Dialysis catheter in stable position. Cardiomegaly with pulmonary venous congestion again noted without interim change. Mild bilateral interstitial prominence again noted without interim change. Findings suggest CHF. Pneumonitis cannot be excluded. No pleural effusion or pneumothorax. No acute bony abnormality. IMPRESSION: 1.  Dialysis catheter in stable position. 2. Cardiomegaly with pulmonary venous congestion and bilateral interstitial prominence again noted without interim change. Findings suggest CHF. Pneumonitis cannot be excluded. Electronically Signed   By: Nettie   On: 06/05/2019 05:30     Medications:   . azithromycin 500 mg (06/06/19 0600)  . cefTRIAXone (ROCEPHIN)  IV 2 g (06/06/19 0518)   . amLODipine  10 mg Oral Daily  . Chlorhexidine Gluconate Cloth  6 each Topical Q0600  . folic acid  1 mg Oral Daily  . heparin  5,000 Units Subcutaneous Q8H  . insulin aspart  0-15 Units Subcutaneous TID WC  . insulin glargine  30 Units Subcutaneous Daily  . metoprolol tartrate  50 mg Oral BID  . pantoprazole  40 mg Oral Daily  . sertraline  100 mg Oral Daily     Assessment/ Plan:  59 y.o. male with type 2 diabetes, hypertension admitted on 06/04/2019 for Acute blood loss anemia [D62] Pneumonia [J18.9] HCAP (healthcare-associated pneumonia) [J18.9] Elevated lactic acid level [R79.89] Altered mental status, unspecified altered mental status type [R41.82] Gastrointestinal hemorrhage, unspecified gastrointestinal hemorrhage type [K92.2]  [facility] TTS [address] [address] RIJ permcath   1.  End-stage renal disease Patient seen during HD, tolerating well, BFR 350, UF target 1.0kg, complete HD today, next HD on Tuesday.   2.  Anemia of chronic  kidney disease Continue Epogen with dialysis Lab Results  Component Value Date   HGB 10.2 (L) 06/06/2019  Presenting hemoglobin 7.3.  Received blood transfusion.  Continue to monitor hgb.   3.  Secondary hyperparathyroidism Lab Results  Component Value Date   CALCIUM 7.6 (L) 06/06/2019   PHOS 2.3 (L) 05/27/2019  Continue to monitor bone mineral metabolism parameters.   4.  Currently being treated empirically for healthcare associated pneumonia with azithromycin and ceftriaxone  5.  Hypokalemia:  K 2.8, increase K bath to 4.      LOS: 2 Dulce Martian 1/23/202110:36 AM  [place], [place]  Note: This note was prepared with Dragon dictation. Any transcription errors are unintentional

## 2019-06-06 NOTE — Progress Notes (Addendum)
Progress Note    [name]  [id number] DOB: Oct 02, 1960  DOA: 06/04/2019 PCP: [name], MD      Brief Narrative:    Medical records reviewed and are as summarized below:       Assessment/Plan:   Active Problems:   HTN (hypertension)   ESRD on hemodialysis ([place])   Anemia   Frequent falls   History of CVA (cerebrovascular accident)   HCAP (healthcare-associated pneumonia)   Acute blood loss anemia   Hyperosmolar hyperglycemic state (HHS) ([place])   Body mass index is 22.42 kg/m.    Pneumonia/pneumonitis/leukocytosis: Discontinue azithromycin.  Change IV Rocephin to oral Augmentin.  End-stage renal disease with fluid overload/pulmonary edema: Follow-up with nephrologist for hemodialysis.  Type 2 diabetes mellitus with severe hyperglycemia/hyperosmolar hyperglycemic state/hypoglycemia today: Insulin drip was discontinued earlier this morning.  He was started on Lantus 30 units today because blood sugar was 346.  He developed hypoglycemia with blood sugar of 42.  Lantus has been decreased to his home dose of 15 units.  His wife said that patient has been having high and low blood sugars for some time now.  She said at the outpatient dialysis center, it would call her that blood sugar had gone up to 700.  She also reported that patient has had episodes of hypoglycemia at home and she has had to call EMS for assistance.  Patient has brittle diabetes and outpatient evaluation by endocrinologist was recommended.  Acute blood loss anemia: Hemoglobin improved status post transfusion with 1 unit of packed red blood cells.  Positive heme stools: Patient was evaluated by the gastroenterologist.. Because of improvement in hemoglobin there is no immediate plan for endoscopic work-up. Of note, he had colonoscopy on 03/06/2019 which showed internal hemorrhoids and polyps that were removed.  Uncontrolled hypertension: Continue antihypertensives.  Recurrent falls: PT and  OT recommended home health therapy.  History of stroke: Aspirin on hold  Loose stools probably from IV antibiotics.  Discontinued IV antibiotics.    Family Communication/Anticipated D/C date and plan/Code Status   DVT prophylaxis: Heparin Code Status: Full code Family Communication: Plan discussed with his wife Disposition Plan: Possible discharge to home in 1 to 2 days      Subjective:   Patient did not report any complaints but his nurse reported that he was having some loose stools.  Objective:    Vitals:   06/06/19 1320 06/06/19 1328 06/06/19 1400 06/06/19 1500  BP:  (!) 166/104    Pulse:  71 72 71  Resp:  17 16 15   Temp: 98.6 F (37 C)     TempSrc: Oral     SpO2:      Weight:      Height:        Intake/Output Summary (Last 24 hours) at 06/06/2019 1705 Last data filed at 06/06/2019 1320 Gross per 24 hour  Intake 480 ml  Output 851 ml  Net -371 ml   Filed Weights   06/04/19 0200 06/04/19 1130 06/04/19 1200  Weight: 74.8 kg 75 kg 75 kg    Exam:  GEN: NAD SKIN: No rash EYES: EOMI ENT: MMM CV: RRR PULM: CTA B ABD: soft, ND, NT, +BS CNS: AAO x 3, non focal EXT: No edema or tenderness   Data Reviewed:   I have personally reviewed following labs and imaging studies:  Labs: Labs show the following:   Basic Metabolic Panel: Recent Labs  Lab 06/04/19 0138 06/04/19 0138 06/05/19 1705 06/05/19 1750 06/06/19  1002  NA 131*  --   --  132* 137  K 3.7   < >  --  3.7 2.8*  CL 95*  --   --  95* 101  CO2 26  --   --  24 28  GLUCOSE 122*  --  771* 765* 337*  BUN 33*  --   --  21* 19  CREATININE 3.38*  --   --  2.80* 2.83*  CALCIUM 8.2*  --   --  7.9* 7.6*   < > = values in this interval not displayed.   GFR Estimated Creatinine Clearance: 29.8 mL/min (A) (by C-G formula based on SCr of 2.83 mg/dL (H)). Liver Function Tests: Recent Labs  Lab 06/04/19 0138  AST 38  ALT 17  ALKPHOS 143*  BILITOT 0.8  PROT 5.5*  ALBUMIN 2.3*   Recent Labs   Lab 06/04/19 0138  LIPASE 33   No results for input(s): AMMONIA in the last 168 hours. Coagulation profile No results for input(s): INR, PROTIME in the last 168 hours.  CBC: Recent Labs  Lab 06/04/19 0138 06/04/19 1515 06/05/19 1750 06/06/19 1002  WBC 16.9* 21.1* 12.6* 9.1  NEUTROABS 14.6*  --  10.2* 6.6  HGB 7.3* 9.1* 11.0* 10.2*  HCT 22.8* 27.4* 34.5* 31.4*  MCV 91.6 87.3 91.5 88.2  PLT 177 151 182 172   Cardiac Enzymes: No results for input(s): CKTOTAL, CKMB, CKMBINDEX, TROPONINI in the last 168 hours. BNP (last 3 results) No results for input(s): PROBNP in the last 8760 hours. CBG: Recent Labs  Lab 06/06/19 0907 06/06/19 1356 06/06/19 1430 06/06/19 1509 06/06/19 1648  GLUCAP 346* 42* 60* 116* 291*   D-Dimer: No results for input(s): DDIMER in the last 72 hours. Hgb A1c: No results for input(s): HGBA1C in the last 72 hours. Lipid Profile: No results for input(s): CHOL, HDL, LDLCALC, TRIG, CHOLHDL, LDLDIRECT in the last 72 hours. Thyroid function studies: No results for input(s): TSH, T4TOTAL, T3FREE, THYROIDAB in the last 72 hours.  Invalid input(s): FREET3 Anemia work up: Recent Labs    06/04/19 0338 06/04/19 1901  VITAMINB12  --  1,461*  FOLATE 21.4  --   TIBC 185*  --   IRON 8*  --    Sepsis Labs: Recent Labs  Lab 06/04/19 0138 06/04/19 0339 06/04/19 1515 06/05/19 1750 06/06/19 1002  WBC 16.9*  --  21.1* 12.6* 9.1  LATICACIDVEN 2.5* 1.9  --   --   --     Microbiology Recent Results (from the past 240 hour(s))  SARS CORONAVIRUS 2 (TAT 6-24 HRS) Nasopharyngeal Nasopharyngeal Swab     Status: None   Collection Time: 05/28/19 11:09 AM   Specimen: Nasopharyngeal Swab  Result Value Ref Range Status   SARS Coronavirus 2 NEGATIVE NEGATIVE Final    Comment: (NOTE) SARS-CoV-2 target nucleic acids are NOT DETECTED. The SARS-CoV-2 RNA is generally detectable in upper and lower respiratory specimens during the acute phase of infection.  Negative results do not preclude SARS-CoV-2 infection, do not rule out co-infections with other pathogens, and should not be used as the sole basis for treatment or other patient management decisions. Negative results must be combined with clinical observations, patient history, and epidemiological information. The expected result is Negative. Fact Sheet for Patients: SugarRoll.be Fact Sheet for Healthcare Providers: https://www.woods-mathews.com/ This test is not yet approved or cleared by the [place] FDA and  has been authorized for detection and/or diagnosis of SARS-CoV-2 by FDA under an Emergency Use Authorization (  EUA). This EUA will remain  in effect (meaning this test can be used) for the duration of the COVID-19 declaration under Section 56 4(b)(1) of the Act, 21 U.S.C. section 360bbb-3(b)(1), unless the authorization is terminated or revoked sooner. Performed at [facility], [place] [address] [place], [place] [address]   Respiratory Panel by RT PCR (Flu A&B, Covid) - Nasopharyngeal Swab     Status: None   Collection Time: 06/04/19  1:44 AM   Specimen: Nasopharyngeal Swab  Result Value Ref Range Status   SARS Coronavirus 2 by RT PCR NEGATIVE NEGATIVE Final    Comment: (NOTE) SARS-CoV-2 target nucleic acids are NOT DETECTED. The SARS-CoV-2 RNA is generally detectable in upper respiratoy specimens during the acute phase of infection. The lowest concentration of SARS-CoV-2 viral copies this assay can detect is 131 copies/mL. A negative result does not preclude SARS-Cov-2 infection and should not be used as the sole basis for treatment or other patient management decisions. A negative result may occur with  improper specimen collection/handling, submission of specimen other than nasopharyngeal swab, presence of viral mutation(s) within the areas targeted by this assay, and inadequate number of viral copies (<131 copies/mL). A  negative result must be combined with clinical observations, patient history, and epidemiological information. The expected result is Negative. Fact Sheet for Patients:  PinkCheek.be Fact Sheet for Healthcare Providers:  GravelBags.it This test is not yet ap proved or cleared by the [place] FDA and  has been authorized for detection and/or diagnosis of SARS-CoV-2 by FDA under an Emergency Use Authorization (EUA). This EUA will remain  in effect (meaning this test can be used) for the duration of the COVID-19 declaration under Section 564(b)(1) of the Act, 21 U.S.C. section 360bbb-3(b)(1), unless the authorization is terminated or revoked sooner.    Influenza A by PCR NEGATIVE NEGATIVE Final   Influenza B by PCR NEGATIVE NEGATIVE Final    Comment: (NOTE) The Xpert Xpress SARS-CoV-2/FLU/RSV assay is intended as an aid in  the diagnosis of influenza from Nasopharyngeal swab specimens and  should not be used as a sole basis for treatment. Nasal washings and  aspirates are unacceptable for Xpert Xpress SARS-CoV-2/FLU/RSV  testing. Fact Sheet for Patients: PinkCheek.be Fact Sheet for Healthcare Providers: GravelBags.it This test is not yet approved or cleared by the [place] FDA and  has been authorized for detection and/or diagnosis of SARS-CoV-2 by  FDA under an Emergency Use Authorization (EUA). This EUA will remain  in effect (meaning this test can be used) for the duration of the  Covid-19 declaration under Section 564(b)(1) of the Act, 21  U.S.C. section 360bbb-3(b)(1), unless the authorization is  terminated or revoked. Performed at [facility], [address] [place], [place] [address]   Culture, blood (routine x 2)     Status: None (Preliminary result)   Collection Time: 06/04/19  3:39 AM   Specimen: BLOOD  Result Value Ref Range Status    Specimen Description BLOOD RIGHT [facility]  Final   Special Requests   Final    BOTTLES DRAWN AEROBIC AND ANAEROBIC Blood Culture results may not be optimal due to an inadequate volume of blood received in culture bottles   Culture   Final    NO GROWTH 2 DAYS Performed at [facility], [address], [place], [place] [address]    Report Status PENDING  Incomplete  Culture, blood (routine x 2)     Status: None (Preliminary result)   Collection Time:  06/04/19  3:39 AM   Specimen: BLOOD  Result Value Ref Range Status   Specimen Description BLOOD LEFT FA  Final   Special Requests   Final    BOTTLES DRAWN AEROBIC AND ANAEROBIC Blood Culture adequate volume   Culture   Final    NO GROWTH 2 DAYS Performed at [facility], [address] [place],  [address]    Report Status PENDING  Incomplete  MRSA PCR Screening     Status: None   Collection Time: 06/04/19  4:09 AM   Specimen: Nasal Mucosa; Nasopharyngeal  Result Value Ref Range Status   MRSA by PCR NEGATIVE NEGATIVE Final    Comment:        The GeneXpert MRSA Assay (FDA approved for NASAL specimens only), is one component of a comprehensive MRSA colonization surveillance program. It is not intended to diagnose MRSA infection nor to guide or monitor treatment for MRSA infections. Performed at [facility] [address] [place],  [address]     Procedures and diagnostic studies:  DG Chest [facility] 1 View  Result Date: 06/05/2019 CLINICAL DATA:  Pneumonia. EXAM: PORTABLE CHEST 1 VIEW COMPARISON:  06/04/2019. FINDINGS: Dialysis catheter in stable position. Cardiomegaly with pulmonary venous congestion again noted without interim change. Mild bilateral interstitial prominence again noted without interim change. Findings suggest CHF. Pneumonitis cannot be excluded. No pleural effusion or pneumothorax. No acute bony abnormality. IMPRESSION: 1.  Dialysis catheter in stable position. 2. Cardiomegaly  with pulmonary venous congestion and bilateral interstitial prominence again noted without interim change. Findings suggest CHF. Pneumonitis cannot be excluded. Electronically Signed   By: [place]   On: 06/05/2019 05:30    Medications:   . amLODipine  10 mg Oral Daily  . [START ON 06/07/2019] amoxicillin-clavulanate  500 mg Oral Q12H  . Chlorhexidine Gluconate Cloth  6 each Topical Q0600  . folic acid  1 mg Oral Daily  . heparin  5,000 Units Subcutaneous Q8H  . insulin aspart  0-15 Units Subcutaneous TID WC  . [START ON 06/07/2019] insulin glargine  15 Units Subcutaneous Daily  . metoprolol tartrate  50 mg Oral BID  . pantoprazole  40 mg Oral Daily  . sertraline  100 mg Oral Daily   Continuous Infusions:    LOS: 2 days   Anjelica Gorniak  [facility]   *Please refer to [email or web address], password [email or web address] to get updated schedule on who will round on this patient, as hospitalists switch teams weekly. If 7PM-7AM, please contact night-coverage at [email or web address], password [email or web address] for any overnight needs.  06/06/2019, 5:05 PM

## 2019-06-06 NOTE — Progress Notes (Signed)
Patient has remained alert and oriented x 4 with no c/o pain. Pt has remained in NSR on cardiac monitor, HR/BP WNL. Pt has remained on RA, SpO2 > 90%. Lung sounds clear to auscultation, NDN. Pt has transferred to [facility] x 2 assist, NDN. Pt initiated on SSI. Pt has orders to transfer to floor. Update given to wife via phone call.

## 2019-06-06 NOTE — Progress Notes (Signed)
This note also relates to the following rows which could not be included: Pulse Rate - Cannot attach notes to unvalidated device data Resp - Cannot attach notes to unvalidated device data  Hd completed  

## 2019-06-06 NOTE — Progress Notes (Signed)
This note also relates to the following rows which could not be included: Pulse Rate - Cannot attach notes to unvalidated device data Resp - Cannot attach notes to unvalidated device data  Hd started  

## 2019-06-07 DIAGNOSIS — E162 Hypoglycemia, unspecified: Secondary | ICD-10-CM

## 2019-06-07 LAB — GLUCOSE, CAPILLARY
Glucose-Capillary: 32 mg/dL — CL (ref 70–99)
Glucose-Capillary: 47 mg/dL — ABNORMAL LOW (ref 70–99)
Glucose-Capillary: 149 mg/dL — ABNORMAL HIGH (ref 70–99)
Glucose-Capillary: 180 mg/dL — ABNORMAL HIGH (ref 70–99)
Glucose-Capillary: 161 mg/dL — ABNORMAL HIGH (ref 70–99)

## 2019-06-07 LAB — BASIC METABOLIC PANEL
Anion gap: 6 (ref 5–15)
BUN: 9 mg/dL (ref 6–20)
CO2: 29 mmol/L (ref 22–32)
Calcium: 7.7 mg/dL — ABNORMAL LOW (ref 8.9–10.3)
Chloride: 100 mmol/L (ref 98–111)
Creatinine, Ser: 2.41 mg/dL — ABNORMAL HIGH (ref 0.61–1.24)
GFR calc Af Amer: 33 mL/min — ABNORMAL LOW (ref >60)
GFR calc non Af Amer: 28 mL/min — ABNORMAL LOW (ref >60)
Glucose, Bld: 249 mg/dL — ABNORMAL HIGH (ref 70–99)
Potassium: 3.4 mmol/L — ABNORMAL LOW (ref 3.5–5.1)
Sodium: 135 mmol/L (ref 135–145)

## 2019-06-07 MED ORDER — DEXTROSE 50 % IV SOLN
INTRAVENOUS | Status: AC
  Filled 2019-06-07: qty 50

## 2019-06-07 MED ORDER — GLUCOSE 40 % PO GEL: 2.0000 | ORAL | Status: AC

## 2019-06-07 MED ORDER — GLUCOSE 40 % PO GEL
ORAL | Status: AC
  Administered 2019-06-07: 75 g via ORAL
  Filled 2019-06-07: qty 1

## 2019-06-07 MED ORDER — HYDRALAZINE HCL 20 MG/ML IJ SOLN: 10.0000 mg | Freq: Four times a day (QID) | INTRAMUSCULAR | Status: DC | PRN

## 2019-06-07 MED ORDER — INSULIN GLARGINE 100 UNIT/ML ~~LOC~~ SOLN
10.0000 [IU] | Freq: Every day | SUBCUTANEOUS | Status: DC
  Administered 2019-06-07 – 2019-06-09 (×3): 10 [IU] via SUBCUTANEOUS
  Filled 2019-06-07 (×4): qty 0.1

## 2019-06-07 NOTE — Progress Notes (Signed)
[place], [place] 06/07/19  Subjective:  Patient seen at bedside. Underwent dialysis yesterday. Resting comfortably in bed at the moment. Still quite weak.  Objective:  Vital signs in last 24 hours:  Temp:  [97.6 F (36.4 C)-98.6 F (37 C)] 97.6 F (36.4 C) (01/24 0456) Pulse Rate:  [71-76] 72 (01/24 1107) Resp:  [15-20] 20 (01/24 1107) BP: (156-182)/(91-106) 182/104 (01/24 1111) SpO2:  [90 %-100 %] 100 % (01/24 1107)  Weight change:  Filed Weights   06/04/19 0200 06/04/19 1130 06/04/19 1200  Weight: 74.8 kg 75 kg 75 kg    Intake/Output:    Intake/Output Summary (Last 24 hours) at 06/07/2019 1313 Last data filed at 06/07/2019 0620 Gross per 24 hour  Intake 360 ml  Output 500 ml  Net -140 ml     Physical Exam: General:  Thin, no acute distress  HEENT  moist oral mucous membranes  Pulm/lungs  clear to auscultation, normal effort  CVS/Heart  irregular rhythm, no rub  Abdomen:   Soft, nontender  Extremities:  No peripheral edema  Neurologic:  Alert, awake, follows commands  Skin:  No acute rashes  Access:  Right IJ PermCath       Basic Metabolic Panel:  Recent Labs  Lab 06/04/19 0138 06/05/19 1705 06/05/19 1750 06/06/19 1002  NA 131*  --  132* 137  K 3.7  --  3.7 2.8*  CL 95*  --  95* 101  CO2 26  --  24 28  GLUCOSE 122* 771* 765* 337*  BUN 33*  --  21* 19  CREATININE 3.38*  --  2.80* 2.83*  CALCIUM 8.2*  --  7.9* 7.6*     CBC: Recent Labs  Lab 06/04/19 0138 06/04/19 1515 06/05/19 1750 06/06/19 1002  WBC 16.9* 21.1* 12.6* 9.1  NEUTROABS 14.6*  --  10.2* 6.6  HGB 7.3* 9.1* 11.0* 10.2*  HCT 22.8* 27.4* 34.5* 31.4*  MCV 91.6 87.3 91.5 88.2  PLT 177 151 182 172      Lab Results  Component Value Date   HEPBSAG NON REACTIVE 04/11/2019   HEPBSAB NON REACTIVE 04/10/2019      Microbiology:  Recent Results (from the past 240 hour(s))  Respiratory Panel by RT PCR (Flu A&B, Covid) - Nasopharyngeal Swab      Status: None   Collection Time: 06/04/19  1:44 AM   Specimen: Nasopharyngeal Swab  Result Value Ref Range Status   SARS Coronavirus 2 by RT PCR NEGATIVE NEGATIVE Final    Comment: (NOTE) SARS-CoV-2 target nucleic acids are NOT DETECTED. The SARS-CoV-2 RNA is generally detectable in upper respiratoy specimens during the acute phase of infection. The lowest concentration of SARS-CoV-2 viral copies this assay can detect is 131 copies/mL. A negative result does not preclude SARS-Cov-2 infection and should not be used as the sole basis for treatment or other patient management decisions. A negative result may occur with  improper specimen collection/handling, submission of specimen other than nasopharyngeal swab, presence of viral mutation(s) within the areas targeted by this assay, and inadequate number of viral copies (<131 copies/mL). A negative result must be combined with clinical observations, patient history, and epidemiological information. The expected result is Negative. Fact Sheet for Patients:  PinkCheek.be Fact Sheet for Healthcare Providers:  GravelBags.it This test is not yet ap proved or cleared by the [place] FDA and  has been authorized for detection and/or diagnosis of SARS-CoV-2 by FDA under an Emergency Use Authorization (EUA). This EUA will remain  in effect (meaning this test can be used) for the duration of the COVID-19 declaration under Section 564(b)(1) of the Act, 21 U.S.C. section 360bbb-3(b)(1), unless the authorization is terminated or revoked sooner.    Influenza A by PCR NEGATIVE NEGATIVE Final   Influenza B by PCR NEGATIVE NEGATIVE Final    Comment: (NOTE) The Xpert Xpress SARS-CoV-2/FLU/RSV assay is intended as an aid in  the diagnosis of influenza from Nasopharyngeal swab specimens and  should not be used as a sole basis for treatment. Nasal washings and  aspirates are unacceptable for  Xpert Xpress SARS-CoV-2/FLU/RSV  testing. Fact Sheet for Patients: PinkCheek.be Fact Sheet for Healthcare Providers: GravelBags.it This test is not yet approved or cleared by the [place] FDA and  has been authorized for detection and/or diagnosis of SARS-CoV-2 by  FDA under an Emergency Use Authorization (EUA). This EUA will remain  in effect (meaning this test can be used) for the duration of the  Covid-19 declaration under Section 564(b)(1) of the Act, 21  U.S.C. section 360bbb-3(b)(1), unless the authorization is  terminated or revoked. Performed at [facility], [address] [place], [place] [address]   Culture, blood (routine x 2)     Status: None (Preliminary result)   Collection Time: 06/04/19  3:39 AM   Specimen: BLOOD  Result Value Ref Range Status   Specimen Description BLOOD RIGHT [facility]  Final   Special Requests   Final    BOTTLES DRAWN AEROBIC AND ANAEROBIC Blood Culture results may not be optimal due to an inadequate volume of blood received in culture bottles   Culture   Final    NO GROWTH 3 DAYS Performed at [facility], [address], [place], [place] [address]    Report Status PENDING  Incomplete  Culture, blood (routine x 2)     Status: None (Preliminary result)   Collection Time: 06/04/19  3:39 AM   Specimen: BLOOD  Result Value Ref Range Status   Specimen Description BLOOD LEFT FA  Final   Special Requests   Final    BOTTLES DRAWN AEROBIC AND ANAEROBIC Blood Culture adequate volume   Culture   Final    NO GROWTH 3 DAYS Performed at [facility], [address] [place], [place] [address]    Report Status PENDING  Incomplete  MRSA PCR Screening     Status: None   Collection Time: 06/04/19  4:09 AM   Specimen: Nasal Mucosa; Nasopharyngeal  Result Value Ref Range Status   MRSA by PCR NEGATIVE NEGATIVE Final    Comment:        The GeneXpert MRSA Assay  (FDA approved for NASAL specimens only), is one component of a comprehensive MRSA colonization surveillance program. It is not intended to diagnose MRSA infection nor to guide or monitor treatment for MRSA infections. Performed at [facility], [address] [place], [place] [address]     Coagulation Studies: No results for input(s): LABPROT, INR in the last 72 hours.  Urinalysis: Recent Labs    06/05/19 1300  COLORURINE YELLOW*  LABSPEC 1.015  PHURINE 6.0  GLUCOSEU >=500*  HGBUR SMALL*  BILIRUBINUR NEGATIVE  KETONESUR NEGATIVE  PROTEINUR 100*  NITRITE NEGATIVE  LEUKOCYTESUR NEGATIVE      Imaging: No results found.   Medications:    . amLODipine  10 mg Oral Daily  . amoxicillin-clavulanate  500 mg Oral Q12H  . Chlorhexidine Gluconate Cloth  6 each Topical Q0600  . folic acid  1 mg Oral Daily  . heparin  5,000 Units Subcutaneous Q8H  . insulin aspart  0-15 Units Subcutaneous TID WC  . insulin glargine  10 Units Subcutaneous Daily  . metoprolol tartrate  50 mg Oral BID  . pantoprazole  40 mg Oral Daily  . sertraline  100 mg Oral Daily     Assessment/ Plan:  59 y.o. male with type 2 diabetes, hypertension admitted on 06/04/2019 for Acute blood loss anemia [D62] Pneumonia [J18.9] HCAP (healthcare-associated pneumonia) [J18.9] Elevated lactic acid level [R79.89] Altered mental status, unspecified altered mental status type [R41.82] Gastrointestinal hemorrhage, unspecified gastrointestinal hemorrhage type [K92.2]  [facility] [address] RIJ permcath   1.  End-stage renal disease Patient underwent dialysis yesterday.  Tolerated well.  No urgent indication for dialysis today.  2.  Anemia of chronic kidney disease  Lab Results  Component Value Date   HGB 10.2 (L) 06/06/2019  Patient underwent transfusion this admission.  Continue to monitor hemoglobin.  3.  Secondary hyperparathyroidism Lab Results  Component Value Date   CALCIUM 7.6  (L) 06/06/2019   PHOS 2.3 (L) 05/27/2019  Phosphorus previously low as the patient has not been eating well.  4.  Currently being treated empirically for healthcare associated pneumonia with azithromycin and ceftriaxone  5.  Hypokalemia: Patient had low potassium of 2.8 yesterday.  We did switch his potassium back to 4.0.  Continue to monitor serum potassium.    LOS: 3 Atasha Colebank 1/24/20211:13 PM  [place], [place]  Note: This note was prepared with Dragon dictation. Any transcription errors are unintentional

## 2019-06-07 NOTE — Progress Notes (Signed)
Hypoglycemic Event  CBG: 32  Treatment: Dextrose 40% oral gel  Symptoms: Confusion and lethargy  Follow-up CBG: Time:0825 CBG Result:47  Possible Reasons for Event:   Comments/MD notified:Notified Dr Mal Misty. Awaiting additional orders    Lewie Chamber
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
## 2019-06-08 LAB — GLUCOSE, CAPILLARY
Glucose-Capillary: >600 mg/dL (ref 70–99)
Glucose-Capillary: 82 mg/dL (ref 70–99)
Glucose-Capillary: 225 mg/dL — ABNORMAL HIGH (ref 70–99)
Glucose-Capillary: 119 mg/dL — ABNORMAL HIGH (ref 70–99)
Glucose-Capillary: 192 mg/dL — ABNORMAL HIGH (ref 70–99)

## 2019-06-08 MED ORDER — LOPERAMIDE HCL 2 MG PO CAPS
2.0000 mg | ORAL_CAPSULE | Freq: Four times a day (QID) | ORAL | Status: DC | PRN
  Administered 2019-06-08 (×2): 2 mg via ORAL
  Filled 2019-06-08 (×2): qty 1

## 2019-06-08 MED ORDER — ASPIRIN EC 81 MG PO TBEC
81.0000 mg | DELAYED_RELEASE_TABLET | Freq: Every day | ORAL | Status: DC
  Administered 2019-06-08 – 2019-06-09 (×2): 81 mg via ORAL
  Filled 2019-06-08 (×2): qty 1

## 2019-06-08 NOTE — Progress Notes (Signed)
[place], [place] 06/08/19  Subjective:  Patient resting comfortably. Due for dialysis again tomorrow.   Objective:  Vital signs in last 24 hours:  Temp:  [97.4 F (36.3 C)-98.5 F (36.9 C)] 98 F (36.7 C) (01/25 1211) Pulse Rate:  [67-78] 67 (01/25 1211) Resp:  [16-21] 18 (01/25 0353) BP: (152-162)/(83-99) 161/99 (01/25 1211) SpO2:  [98 %-100 %] 99 % (01/25 1211)  Weight change:  Filed Weights   06/04/19 0200 06/04/19 1130 06/04/19 1200  Weight: 74.8 kg 75 kg 75 kg    Intake/Output:    Intake/Output Summary (Last 24 hours) at 06/08/2019 1222 Last data filed at 06/08/2019 [id number] Gross per 24 hour  Intake 120 ml  Output --  Net 120 ml     Physical Exam: General:  Thin, no acute distress  HEENT  moist oral mucous membranes  Pulm/lungs  clear to auscultation, normal effort  CVS/Heart  irregular rhythm, no rub  Abdomen:   Soft, nontender  Extremities:  No peripheral edema  Neurologic:  Alert, awake, follows commands  Skin:  No acute rashes  Access:  Right IJ PermCath       Basic Metabolic Panel:  Recent Labs  Lab 06/04/19 0138 06/04/19 0138 06/05/19 1705 06/05/19 1750 06/06/19 1002 06/07/19 1333  NA 131*  --   --  132* 137 135  K 3.7  --   --  3.7 2.8* 3.4*  CL 95*  --   --  95* 101 100  CO2 26  --   --  24 28 29   GLUCOSE 122*  --  771* 765* 337* 249*  BUN 33*  --   --  21* 19 9  CREATININE 3.38*  --   --  2.80* 2.83* 2.41*  CALCIUM 8.2*   < >  --  7.9* 7.6* 7.7*   < > = values in this interval not displayed.     CBC: Recent Labs  Lab 06/04/19 0138 06/04/19 1515 06/05/19 1750 06/06/19 1002  WBC 16.9* 21.1* 12.6* 9.1  NEUTROABS 14.6*  --  10.2* 6.6  HGB 7.3* 9.1* 11.0* 10.2*  HCT 22.8* 27.4* 34.5* 31.4*  MCV 91.6 87.3 91.5 88.2  PLT 177 151 182 172      Lab Results  Component Value Date   HEPBSAG NON REACTIVE 04/11/2019   HEPBSAB NON REACTIVE 04/10/2019      Microbiology:  Recent Results (from the  past 240 hour(s))  Respiratory Panel by RT PCR (Flu A&B, Covid) - Nasopharyngeal Swab     Status: None   Collection Time: 06/04/19  1:44 AM   Specimen: Nasopharyngeal Swab  Result Value Ref Range Status   SARS Coronavirus 2 by RT PCR NEGATIVE NEGATIVE Final    Comment: (NOTE) SARS-CoV-2 target nucleic acids are NOT DETECTED. The SARS-CoV-2 RNA is generally detectable in upper respiratoy specimens during the acute phase of infection. The lowest concentration of SARS-CoV-2 viral copies this assay can detect is 131 copies/mL. A negative result does not preclude SARS-Cov-2 infection and should not be used as the sole basis for treatment or other patient management decisions. A negative result may occur with  improper specimen collection/handling, submission of specimen other than nasopharyngeal swab, presence of viral mutation(s) within the areas targeted by this assay, and inadequate number of viral copies (<131 copies/mL). A negative result must be combined with clinical observations, patient history, and epidemiological information. The expected result is Negative. Fact Sheet for Patients:  PinkCheek.be Fact Sheet for Healthcare Providers:  GravelBags.it This test is not yet ap proved or cleared by the [place] and  has been authorized for detection and/or diagnosis of SARS-CoV-2 by FDA under an Emergency Use Authorization (EUA). This EUA will remain  in effect (meaning this test can be used) for the duration of the COVID-19 declaration under Section 564(b)(1) of the Act, 21 U.S.C. section 360bbb-3(b)(1), unless the authorization is terminated or revoked sooner.    Influenza A by PCR NEGATIVE NEGATIVE Final   Influenza B by PCR NEGATIVE NEGATIVE Final    Comment: (NOTE) The Xpert Xpress SARS-CoV-2/FLU/RSV assay is intended as an aid in  the diagnosis of influenza from Nasopharyngeal swab specimens and  should not be  used as a sole basis for treatment. Nasal washings and  aspirates are unacceptable for Xpert Xpress SARS-CoV-2/FLU/RSV  testing. Fact Sheet for Patients: PinkCheek.be Fact Sheet for Healthcare Providers: GravelBags.it This test is not yet approved or cleared by the [place] FDA and  has been authorized for detection and/or diagnosis of SARS-CoV-2 by  FDA under an Emergency Use Authorization (EUA). This EUA will remain  in effect (meaning this test can be used) for the duration of the  Covid-19 declaration under Section 564(b)(1) of the Act, 21  U.S.C. section 360bbb-3(b)(1), unless the authorization is  terminated or revoked. Performed at [facility], [address] [place], [place] [address]   Culture, blood (routine x 2)     Status: None (Preliminary result)   Collection Time: 06/04/19  3:39 AM   Specimen: BLOOD  Result Value Ref Range Status   Specimen Description BLOOD RIGHT [facility]  Final   Special Requests   Final    BOTTLES DRAWN AEROBIC AND ANAEROBIC Blood Culture results may not be optimal due to an inadequate volume of blood received in culture bottles   Culture   Final    NO GROWTH 4 DAYS Performed at [facility], [address] [place], [place] [address]    Report Status PENDING  Incomplete  Culture, blood (routine x 2)     Status: None (Preliminary result)   Collection Time: 06/04/19  3:39 AM   Specimen: BLOOD  Result Value Ref Range Status   Specimen Description BLOOD LEFT FA  Final   Special Requests   Final    BOTTLES DRAWN AEROBIC AND ANAEROBIC Blood Culture adequate volume   Culture   Final    NO GROWTH 4 DAYS Performed at [facility], [address] [place], [place] [address]    Report Status PENDING  Incomplete  MRSA PCR Screening     Status: None   Collection Time: 06/04/19  4:09 AM   Specimen: Nasal Mucosa; Nasopharyngeal  Result Value Ref Range Status   MRSA  by PCR NEGATIVE NEGATIVE Final    Comment:        The GeneXpert MRSA Assay (FDA approved for NASAL specimens only), is one component of a comprehensive MRSA colonization surveillance program. It is not intended to diagnose MRSA infection nor to guide or monitor treatment for MRSA infections. Performed at [facility], [address] [place], [place] [address]     Coagulation Studies: No results for input(s): LABPROT, INR in the last 72 hours.  Urinalysis: Recent Labs    06/05/19 1300  COLORURINE YELLOW*  LABSPEC 1.015  PHURINE 6.0  GLUCOSEU >=500*  HGBUR SMALL*  BILIRUBINUR NEGATIVE  KETONESUR NEGATIVE  PROTEINUR 100*  NITRITE NEGATIVE  LEUKOCYTESUR NEGATIVE      Imaging:  No results found.   Medications:    . amLODipine  10 mg Oral Daily  . amoxicillin-clavulanate  500 mg Oral Q12H  . Chlorhexidine Gluconate Cloth  6 each Topical Q0600  . folic acid  1 mg Oral Daily  . heparin  5,000 Units Subcutaneous Q8H  . insulin aspart  0-15 Units Subcutaneous TID WC  . insulin glargine  10 Units Subcutaneous Daily  . metoprolol tartrate  50 mg Oral BID  . pantoprazole  40 mg Oral Daily  . sertraline  100 mg Oral Daily     Assessment/ Plan:  59 y.o. male with type 2 diabetes, hypertension admitted on 06/04/2019 for Acute blood loss anemia [D62] Pneumonia [J18.9] HCAP (healthcare-associated pneumonia) [J18.9] Elevated lactic acid level [R79.89] Altered mental status, unspecified altered mental status type [R41.82] Gastrointestinal hemorrhage, unspecified gastrointestinal hemorrhage type [K92.2]  [facility] TTS [address] RIJ permcath   1.  End-stage renal disease No signs of volume overload at the moment.  We will plan for hemodialysis again tomorrow.  Minimal ultrafiltration to be planned..  2.  Anemia of chronic kidney disease  Lab Results  Component Value Date   HGB 10.2 (L) 06/06/2019  Patient underwent transfusion this admission.  Continue  to monitor hemoglobin.  3.  Secondary hyperparathyroidism Lab Results  Component Value Date   CALCIUM 7.7 (L) 06/07/2019   PHOS 2.3 (L) 05/27/2019   Repeat serum phosphorus tomorrow as phosphorus has been low recently.  4.  Hypokalemia: Potassium up to 3.4.  We will dialyze the patient against a 4K bath tomorrow.    LOS: 4 Naksh Radi 1/25/202112:22 PM  [place], [place]  Note: This note was prepared with Dragon dictation. Any transcription errors are unintentional

## 2019-06-08 NOTE — Progress Notes (Signed)
Occupational Therapy Treatment Patient Details Name: [name] MRN: [id number] DOB: January 19, 1961 Today's Date: 06/08/2019    History of present illness Pt is 59 y/o M with PMH ESRD on HD TTS, T2DM, ETOH abuse, HLD, HTN, and stroke with some residual R sided weakness. Pt presented with concern for recurrent falls. Found to be hypokalemic.   OT comments  Pt. performed AROM with the ROM, and reaching. Pt. Reports that using his arm, and reaching makes it more tired. Pt. Education was provided about opportunities to engage his RUE during ADL tasks. Pt. requires cues for encouragement. Pt. education was provided about basic reacher use for LE ADLs. Pt. Continues to benefit from OT services for ADL training, A/E training, RUE neuromuscular re-ed, there. Ex., and pt. education about home modification, and DME. Pt. Continues to be appropriate for SNF level of care upon discharge with follow-up OT services.    Follow Up Recommendations  SNF    Equipment Recommendations  3 in 1 bedside commode    Recommendations for Other Services      Precautions / Restrictions Precautions Precautions: Fall Restrictions Weight Bearing Restrictions: No       Mobility Bed Mobility    Independent              Transfers                 General transfer comment: Deferred    Balance                                           ADL either performed or assessed with clinical judgement   ADL Overall ADL's : Needs assistance/impaired     Grooming: Set up               Lower Body Dressing: Set up   Toilet Transfer: Minimal assistance                   Vision       Perception     Praxis      Cognition Arousal/Alertness: Awake/alert Behavior During Therapy: WFL for tasks assessed/performed Overall Cognitive Status: Impaired/Different from baseline                                          Exercises     Shoulder Instructions        General Comments      Pertinent Vitals/ Pain       Pain Assessment: No/denies pain  Home Living                                          Prior Functioning/Environment              Frequency  Min 1X/week        Progress Toward Goals  OT Goals(current goals can now be found in the care plan section)  Progress towards OT goals: Progressing toward goals  Acute Rehab OT Goals Patient Stated Goal: improve strength OT Goal Formulation: With patient Potential to Achieve Goals: Good  Plan      Co-evaluation  AM-PAC OT "6 Clicks" Daily Activity     Outcome Measure   Help from another person eating meals?: None Help from another person taking care of personal grooming?: A Little Help from another person toileting, which includes using toliet, bedpan, or urinal?: A Little Help from another person bathing (including washing, rinsing, drying)?: A Lot Help from another person to put on and taking off regular upper body clothing?: None Help from another person to put on and taking off regular lower body clothing?: A Little 6 Click Score: 19    End of Session    OT Visit Diagnosis: Unsteadiness on feet (R26.81);Muscle weakness (generalized) (M62.81)   Activity Tolerance Patient tolerated treatment well   Patient Left in bed;with call bell/phone within reach;with bed alarm set   Nurse Communication          Time: [id number] OT Time Calculation (min): 15 min  Charges: OT General Charges $OT Visit: 1 Visit OT Treatments $Self Care/Home Management : 8-22 mins  Harrel Carina, MS, OTR/L  Harrel Carina 06/08/2019, 9:52 AM
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
## 2019-06-08 NOTE — Care Management Important Message (Signed)
Important Message  Patient Details  Name: [name] MRN: [id number] Date of Birth: [birth year]   Medicare Important Message Given:  Yes     Dannette Barbara 06/08/2019, 11:34 AM

## 2019-06-08 NOTE — Progress Notes (Addendum)
Progress Note    [name]  [id number] DOB: [birth year]  DOA: 06/04/2019 PCP: [name], MD      Brief Narrative:    Medical records reviewed and are as summarized below:       Assessment/Plan:   Active Problems:   HTN (hypertension)   Hypoglycemia   ESRD on hemodialysis ([place])   Anemia   Frequent falls   History of CVA (cerebrovascular accident)   HCAP (healthcare-associated pneumonia)   Acute blood loss anemia   Hyperosmolar hyperglycemic state (HHS) ([place])   Body mass index is 22.42 kg/m.    Pneumonia/pneumonitis/leukocytosis: Leukocytosis resolved.  Discontinue Augmentin.    Patient has been having watery stools and this may be from antibiotics.  No indication for C. difficile testing at this time.  (Discussed with infectious disease specialist).  Imodium as needed for diarrhea.  End-stage renal disease with fluid overload/pulmonary edema: Follow-up with nephrologist for hemodialysis.  Type 2 diabetes mellitus with severe hyperglycemia/hyperosmolar hyperglycemic state/hypoglycemia : Continue Lantus and monitor glucose levels closely.  His wife reports that patient has been having problems with recurrent hypoglycemia and hypoglycemia. Outpatient evaluation by endocrinologist was recommended.  Acute blood loss anemia: Hemoglobin improved status post transfusion with 1 unit of packed red blood cells.  Positive heme stools: Patient was evaluated by the gastroenterologist.. Because of improvement in hemoglobin there is no immediate plan for endoscopic work-up. Of note, he had colonoscopy on 03/06/2019 which showed internal hemorrhoids and polyps that were removed.  Uncontrolled hypertension: Continue antihypertensives.  Recurrent falls: PT and OT recommended home health therapy.  His wife said that she would not be able to take care of the patient at home and wanted patient to go to a skilled nursing facility.  Follow-up with social worker to assist  with disposition.  History of stroke: Resume aspirin.      Family Communication/Anticipated D/C date and plan/Code Status   DVT prophylaxis: Heparin Code Status: Full code Family Communication: None Disposition Plan: Possible discharge to home in 1 to 2 days      Subjective:   He complains of watery stools.  No abdominal pain, fever or chills.  He was apologetic for his behavior yesterday.  Objective:    Vitals:   06/08/19 0353 06/08/19 0916 06/08/19 0922 06/08/19 1211  BP: (!) 160/91 (!) 161/83  (!) 161/99  Pulse: 70 73  67  Resp: 18     Temp: (!) 97.4 F (36.3 C)   98 F (36.7 C)  TempSrc: Oral   Oral  SpO2: 98%  99% 99%  Weight:      Height:        Intake/Output Summary (Last 24 hours) at 06/08/2019 1355 Last data filed at 06/08/2019 [id number] Gross per 24 hour  Intake 0 ml  Output --  Net 0 ml   Filed Weights   06/04/19 0200 06/04/19 1130 06/04/19 1200  Weight: 74.8 kg 75 kg 75 kg    Exam:  GEN: NAD SKIN: No rash EYES: EOMI ENT: MMM CV: RRR PULM: No wheezing or rales heard ABD: soft, ND, NT, +BS CNS: AAO x 3, non focal EXT: No edema or tenderness     Data Reviewed:   I have personally reviewed following labs and imaging studies:  Labs: Labs show the following:   Basic Metabolic Panel: Recent Labs  Lab 06/04/19 0138 06/04/19 0138 06/05/19 1705 06/05/19 1750 06/05/19 1750 06/06/19 1002 06/07/19 1333  NA 131*  --   --  132*  --  137 135  K 3.7   < >  --  3.7   < > 2.8* 3.4*  CL 95*  --   --  95*  --  101 100  CO2 26  --   --  24  --  28 29  GLUCOSE 122*  --  771* 765*  --  337* 249*  BUN 33*  --   --  21*  --  19 9  CREATININE 3.38*  --   --  2.80*  --  2.83* 2.41*  CALCIUM 8.2*  --   --  7.9*  --  7.6* 7.7*   < > = values in this interval not displayed.   GFR Estimated Creatinine Clearance: 35 mL/min (A) (by C-G formula based on SCr of 2.41 mg/dL (H)). Liver Function Tests: Recent Labs  Lab 06/04/19 0138  AST 38  ALT 17   ALKPHOS 143*  BILITOT 0.8  PROT 5.5*  ALBUMIN 2.3*   Recent Labs  Lab 06/04/19 0138  LIPASE 33   No results for input(s): AMMONIA in the last 168 hours. Coagulation profile No results for input(s): INR, PROTIME in the last 168 hours.  CBC: Recent Labs  Lab 06/04/19 0138 06/04/19 1515 06/05/19 1750 06/06/19 1002  WBC 16.9* 21.1* 12.6* 9.1  NEUTROABS 14.6*  --  10.2* 6.6  HGB 7.3* 9.1* 11.0* 10.2*  HCT 22.8* 27.4* 34.5* 31.4*  MCV 91.6 87.3 91.5 88.2  PLT 177 151 182 172   Cardiac Enzymes: No results for input(s): CKTOTAL, CKMB, CKMBINDEX, TROPONINI in the last 168 hours. BNP (last 3 results) No results for input(s): PROBNP in the last 8760 hours. CBG: Recent Labs  Lab 06/07/19 1103 06/07/19 1657 06/07/19 2102 06/08/19 0758 06/08/19 1210  GLUCAP 149* 180* 161* 82 225*   D-Dimer: No results for input(s): DDIMER in the last 72 hours. Hgb A1c: No results for input(s): HGBA1C in the last 72 hours. Lipid Profile: No results for input(s): CHOL, HDL, LDLCALC, TRIG, CHOLHDL, LDLDIRECT in the last 72 hours. Thyroid function studies: No results for input(s): TSH, T4TOTAL, T3FREE, THYROIDAB in the last 72 hours.  Invalid input(s): FREET3 Anemia work up: No results for input(s): VITAMINB12, FOLATE, FERRITIN, TIBC, IRON, RETICCTPCT in the last 72 hours. Sepsis Labs: Recent Labs  Lab 06/04/19 0138 06/04/19 0339 06/04/19 1515 06/05/19 1750 06/06/19 1002  WBC 16.9*  --  21.1* 12.6* 9.1  LATICACIDVEN 2.5* 1.9  --   --   --     Microbiology Recent Results (from the past 240 hour(s))  Respiratory Panel by RT PCR (Flu A&B, Covid) - Nasopharyngeal Swab     Status: None   Collection Time: 06/04/19  1:44 AM   Specimen: Nasopharyngeal Swab  Result Value Ref Range Status   SARS Coronavirus 2 by RT PCR NEGATIVE NEGATIVE Final    Comment: (NOTE) SARS-CoV-2 target nucleic acids are NOT DETECTED. The SARS-CoV-2 RNA is generally detectable in upper respiratoy specimens  during the acute phase of infection. The lowest concentration of SARS-CoV-2 viral copies this assay can detect is 131 copies/mL. A negative result does not preclude SARS-Cov-2 infection and should not be used as the sole basis for treatment or other patient management decisions. A negative result may occur with  improper specimen collection/handling, submission of specimen other than nasopharyngeal swab, presence of viral mutation(s) within the areas targeted by this assay, and inadequate number of viral copies (<131 copies/mL). A negative result must be combined with clinical observations, patient  history, and epidemiological information. The expected result is Negative. Fact Sheet for Patients:  PinkCheek.be Fact Sheet for Healthcare Providers:  GravelBags.it This test is not yet ap proved or cleared by the [place] FDA and  has been authorized for detection and/or diagnosis of SARS-CoV-2 by FDA under an Emergency Use Authorization (EUA). This EUA will remain  in effect (meaning this test can be used) for the duration of the COVID-19 declaration under Section 564(b)(1) of the Act, 21 U.S.C. section 360bbb-3(b)(1), unless the authorization is terminated or revoked sooner.    Influenza A by PCR NEGATIVE NEGATIVE Final   Influenza B by PCR NEGATIVE NEGATIVE Final    Comment: (NOTE) The Xpert Xpress SARS-CoV-2/FLU/RSV assay is intended as an aid in  the diagnosis of influenza from Nasopharyngeal swab specimens and  should not be used as a sole basis for treatment. Nasal washings and  aspirates are unacceptable for Xpert Xpress SARS-CoV-2/FLU/RSV  testing. Fact Sheet for Patients: PinkCheek.be Fact Sheet for Healthcare Providers: GravelBags.it This test is not yet approved or cleared by the [place] FDA and  has been authorized for detection and/or diagnosis of  SARS-CoV-2 by  FDA under an Emergency Use Authorization (EUA). This EUA will remain  in effect (meaning this test can be used) for the duration of the  Covid-19 declaration under Section 564(b)(1) of the Act, 21  U.S.C. section 360bbb-3(b)(1), unless the authorization is  terminated or revoked. Performed at [facility], [address] [place], [place] [address]   Culture, blood (routine x 2)     Status: None (Preliminary result)   Collection Time: 06/04/19  3:39 AM   Specimen: BLOOD  Result Value Ref Range Status   Specimen Description BLOOD RIGHT [facility]  Final   Special Requests   Final    BOTTLES DRAWN AEROBIC AND ANAEROBIC Blood Culture results may not be optimal due to an inadequate volume of blood received in culture bottles   Culture   Final    NO GROWTH 4 DAYS Performed at [facility], [address] [place], [place] [address]    Report Status PENDING  Incomplete  Culture, blood (routine x 2)     Status: None (Preliminary result)   Collection Time: 06/04/19  3:39 AM   Specimen: BLOOD  Result Value Ref Range Status   Specimen Description BLOOD LEFT FA  Final   Special Requests   Final    BOTTLES DRAWN AEROBIC AND ANAEROBIC Blood Culture adequate volume   Culture   Final    NO GROWTH 4 DAYS Performed at [facility], [address], [place], [place] [address]    Report Status PENDING  Incomplete  MRSA PCR Screening     Status: None   Collection Time: 06/04/19  4:09 AM   Specimen: Nasal Mucosa; Nasopharyngeal  Result Value Ref Range Status   MRSA by PCR NEGATIVE NEGATIVE Final    Comment:        The GeneXpert MRSA Assay (FDA approved for NASAL specimens only), is one component of a comprehensive MRSA colonization surveillance program. It is not intended to diagnose MRSA infection nor to guide or monitor treatment for MRSA infections. Performed at [facility], [address] [place], [place] [address]     Procedures and  diagnostic studies:  No results found.  Medications:   . amLODipine  10 mg Oral Daily  . aspirin EC  81 mg Oral Daily  . Chlorhexidine Gluconate Cloth  6 each Topical  [id number]  . folic acid  1 mg Oral Daily  . heparin  5,000 Units Subcutaneous Q8H  . insulin aspart  0-15 Units Subcutaneous TID WC  . insulin glargine  10 Units Subcutaneous Daily  . metoprolol tartrate  50 mg Oral BID  . pantoprazole  40 mg Oral Daily  . sertraline  100 mg Oral Daily   Continuous Infusions:    LOS: 4 days   [name]  [facility]   *Please refer to [email or web address], password [email or web address] to get updated schedule on who will round on this patient, as hospitalists switch teams weekly. If 7PM-7AM, please contact night-coverage at [email or web address], password [email or web address] for any overnight needs.  06/08/2019, 1:55 PM

## 2019-06-08 NOTE — Progress Notes (Signed)
ID Asked to approve cdiff stool test Pt admitted on 1/21 wiith falls H/o ESRD, CVA On admission had WBC of 16.9>21.>12.6>9.1 Treated for pneumonia with Azithromycin and Iv ceftriaxone fropm 1/21 and changed to augmentin yesterday Type 6 stools documented from 1/22>1/24 One type 7 stool 1/24 No fever No pain abdomen No abdominal tenderness No leucocytosis today CXR  Cardiomegaly with pulmonary venous congestion and bilateral interstitial prominence again noted without interim change. Findings suggest CHF. Pneumonitis cannot be excluded   Impression Diarrhea - no corroborating findings on examination, symptoms and labs to suggest cdiff IT could be antibiotic related diarrhea or other cause The stool count is also improving Day 5 of antibiotic Recommend Dc antibiotic Observe  Discussed with Dr.Ayiku

## 2019-06-08 NOTE — Progress Notes (Signed)
PT Cancellation Note  Patient Details Name: [name] MRN: [id number] DOB: May 25, 1960   Cancelled Treatment:    Reason Eval/Treat Not Completed: Other (comment)(Chart reviewed, Treatment attempted. Pt refused working with PT today, feels his diarrhea to be prohibitive. Author explained importance of getting OOB each day, pt acknowledges. Will attempt again at later date/time.)  11:17 AM, 06/08/19 Etta Grandchild, PT, DPT Physical Therapist - [facility]  [phone] ([place])    Shaeleigh Graw C 06/08/2019, 11:17 AM

## 2019-06-08 NOTE — Progress Notes (Signed)
Inpatient Diabetes Program Recommendations  AACE/ADA: New Consensus Statement on Inpatient Glycemic Control (2015)  Target Ranges:  Prepandial:   less than 140 mg/dL      Peak postprandial:   less than 180 mg/dL (1-2 hours)      Critically ill patients:  140 - 180 mg/dL   Lab Results  Component Value Date   GLUCAP 82 06/08/2019   HGBA1C 12.3 (H) 05/28/2019    Review of Glycemic Control Results for [name], [name] (MRN [id number]) as of 06/08/2019 10:16  Ref. Range 06/07/2019 08:25 06/07/2019 11:03 06/07/2019 16:57 06/07/2019 21:02 06/08/2019 07:58  Glucose-Capillary Latest Ref Range: 70 - 99 mg/dL 47 (L) 149 (H) 180 (H) 161 (H) 82   Diabetes history: DM2 Outpatient Diabetes medications: Lantus 15 units prn Current orders for Inpatient glycemic control: Lantus 10 units qd + Novolog moderate correction tid  Inpatient Diabetes Program Recommendations:   -Decrease Novolog correction to sensitive tid -Decrease Lantus to 8 units qd and consider changing to Levemir due to ESRD  Thank you, Bethena Roys E. Naylin Burkle, RN, MSN, CDE  Diabetes Coordinator Inpatient Glycemic Control Team Team Pager [phone] (8am-5pm) 06/08/2019 10:43 AM

## 2019-06-09 LAB — RENAL FUNCTION PANEL
Albumin: 1.9 g/dL — ABNORMAL LOW (ref 3.5–5.0)
Anion gap: 5 (ref 5–15)
BUN: 17 mg/dL (ref 6–20)
CO2: 27 mmol/L (ref 22–32)
Calcium: 7.8 mg/dL — ABNORMAL LOW (ref 8.9–10.3)
Chloride: 103 mmol/L (ref 98–111)
Creatinine, Ser: 3.38 mg/dL — ABNORMAL HIGH (ref 0.61–1.24)
GFR calc Af Amer: 22 mL/min — ABNORMAL LOW (ref >60)
GFR calc non Af Amer: 19 mL/min — ABNORMAL LOW (ref >60)
Glucose, Bld: 268 mg/dL — ABNORMAL HIGH (ref 70–99)
Phosphorus: 1.6 mg/dL — ABNORMAL LOW (ref 2.5–4.6)
Potassium: 3.6 mmol/L (ref 3.5–5.1)
Sodium: 135 mmol/L (ref 135–145)

## 2019-06-09 LAB — GLUCOSE, CAPILLARY
Glucose-Capillary: 95 mg/dL (ref 70–99)
Glucose-Capillary: 125 mg/dL — ABNORMAL HIGH (ref 70–99)

## 2019-06-09 LAB — CULTURE, BLOOD (ROUTINE X 2)
Culture: NO GROWTH
Culture: NO GROWTH
Special Requests: ADEQUATE

## 2019-06-09 MED ORDER — RENA-VITE PO TABS: 1.0000 | ORAL_TABLET | Freq: Every day | ORAL | Status: DC

## 2019-06-09 MED ORDER — ASPIRIN 81 MG PO TBEC: 81.0000 mg | DELAYED_RELEASE_TABLET | Freq: Every day | ORAL | Status: AC

## 2019-06-09 MED ORDER — INSULIN GLARGINE 100 UNIT/ML ~~LOC~~ SOLN: 10.0000 [IU] | Freq: Every day | SUBCUTANEOUS | 11 refills | Status: DC

## 2019-06-09 MED ORDER — NEPRO/CARBSTEADY PO LIQD: 237.0000 mL | Freq: Two times a day (BID) | ORAL | Status: DC

## 2019-06-09 MED ORDER — SODIUM PHOSPHATES 45 MMOLE/15ML IV SOLN
20.0000 mmol | Freq: Once | INTRAVENOUS | Status: AC
  Administered 2019-06-09: 20 mmol via INTRAVENOUS
  Filled 2019-06-09: qty 6.67

## 2019-06-09 NOTE — Progress Notes (Signed)
[facility], [place] 06/09/19  Subjective:  Patient seen and evaluated during hemodialysis. Tolerating well. Patient being switched to a 4K bath. Resting comfortably at the moment.    HEMODIALYSIS FLOWSHEET:  Blood Flow Rate (mL/min): 350 mL/min Arterial Pressure (mmHg): -160 mmHg Venous Pressure (mmHg): 90 mmHg Transmembrane Pressure (mmHg): 60 mmHg Ultrafiltration Rate (mL/min): 290 mL/min Dialysate Flow Rate (mL/min): 800 ml/min Conductivity: Machine : 13.9 Conductivity: Machine : 13.9 Dialysis Fluid Bolus: Normal Saline Bolus Amount (mL): 250 mL Dialysate Change: 4K     Objective:  Vital signs in last 24 hours:  Temp:  [98 F (36.7 C)-98.3 F (36.8 C)] 98 F (36.7 C) (01/26 0940) Pulse Rate:  [62-74] 74 (01/26 1100) Resp:  [12-19] 19 (01/26 1100) BP: (134-161)/(75-101) 134/89 (01/26 1100) SpO2:  [96 %-100 %] 100 % (01/26 1030) Weight:  [64.3 kg] 64.3 kg (01/26 0940)  Weight change:  Filed Weights   06/04/19 1130 06/04/19 1200 06/09/19 0940  Weight: 75 kg 75 kg 64.3 kg    Intake/Output:    Intake/Output Summary (Last 24 hours) at 06/09/2019 1105 Last data filed at 06/08/2019 1700 Gross per 24 hour  Intake 240 ml  Output --  Net 240 ml     Physical Exam: General:  Thin, no acute distress  HEENT  moist oral mucous membranes  Pulm/lungs  clear to auscultation, normal effort  CVS/Heart  irregular rhythm, no rub  Abdomen:   Soft, nontender  Extremities:  No peripheral edema  Neurologic:  Alert, awake, follows commands  Skin:  No acute rashes  Access:  Right IJ PermCath       Basic Metabolic Panel:  Recent Labs  Lab 06/04/19 0138 06/04/19 0138 06/05/19 1705 06/05/19 1750 06/05/19 1750 06/06/19 1002 06/07/19 1333 06/09/19 0945  NA 131*  --   --  132*  --  137 135 135  K 3.7  --   --  3.7  --  2.8* 3.4* 3.6  CL 95*  --   --  95*  --  101 100 103  CO2 26  --   --  24  --  28 29 27   GLUCOSE 122*   < > 771* 765*   --  337* 249* 268*  BUN 33*  --   --  21*  --  19 9 17   CREATININE 3.38*  --   --  2.80*  --  2.83* 2.41* 3.38*  CALCIUM 8.2*   < >  --  7.9*   < > 7.6* 7.7* 7.8*  PHOS  --   --   --   --   --   --   --  1.6*   < > = values in this interval not displayed.     CBC: Recent Labs  Lab 06/04/19 0138 06/04/19 1515 06/05/19 1750 06/06/19 1002  WBC 16.9* 21.1* 12.6* 9.1  NEUTROABS 14.6*  --  10.2* 6.6  HGB 7.3* 9.1* 11.0* 10.2*  HCT 22.8* 27.4* 34.5* 31.4*  MCV 91.6 87.3 91.5 88.2  PLT 177 151 182 172      Lab Results  Component Value Date   HEPBSAG NON REACTIVE 04/11/2019   HEPBSAB NON REACTIVE 04/10/2019      Microbiology:  Recent Results (from the past 240 hour(s))  Respiratory Panel by RT PCR (Flu A&B, Covid) - Nasopharyngeal Swab     Status: None   Collection Time: 06/04/19  1:44 AM   Specimen: Nasopharyngeal Swab  Result Value Ref Range Status  SARS Coronavirus 2 by RT PCR NEGATIVE NEGATIVE Final    Comment: (NOTE) SARS-CoV-2 target nucleic acids are NOT DETECTED. The SARS-CoV-2 RNA is generally detectable in upper respiratoy specimens during the acute phase of infection. The lowest concentration of SARS-CoV-2 viral copies this assay can detect is 131 copies/mL. A negative result does not preclude SARS-Cov-2 infection and should not be used as the sole basis for treatment or other patient management decisions. A negative result may occur with  improper specimen collection/handling, submission of specimen other than nasopharyngeal swab, presence of viral mutation(s) within the areas targeted by this assay, and inadequate number of viral copies (<131 copies/mL). A negative result must be combined with clinical observations, patient history, and epidemiological information. The expected result is Negative. Fact Sheet for Patients:  PinkCheek.be Fact Sheet for Healthcare Providers:  GravelBags.it This test  is not yet ap proved or cleared by the [place] FDA and  has been authorized for detection and/or diagnosis of SARS-CoV-2 by FDA under an Emergency Use Authorization (EUA). This EUA will remain  in effect (meaning this test can be used) for the duration of the COVID-19 declaration under Section 564(b)(1) of the Act, 21 U.S.C. section 360bbb-3(b)(1), unless the authorization is terminated or revoked sooner.    Influenza A by PCR NEGATIVE NEGATIVE Final   Influenza B by PCR NEGATIVE NEGATIVE Final    Comment: (NOTE) The Xpert Xpress SARS-CoV-2/FLU/RSV assay is intended as an aid in  the diagnosis of influenza from Nasopharyngeal swab specimens and  should not be used as a sole basis for treatment. Nasal washings and  aspirates are unacceptable for Xpert Xpress SARS-CoV-2/FLU/RSV  testing. Fact Sheet for Patients: PinkCheek.be Fact Sheet for Healthcare Providers: GravelBags.it This test is not yet approved or cleared by the [place] FDA and  has been authorized for detection and/or diagnosis of SARS-CoV-2 by  FDA under an Emergency Use Authorization (EUA). This EUA will remain  in effect (meaning this test can be used) for the duration of the  Covid-19 declaration under Section 564(b)(1) of the Act, 21  U.S.C. section 360bbb-3(b)(1), unless the authorization is  terminated or revoked. Performed at [facility], [address] [place], [place] [address]   Culture, blood (routine x 2)     Status: None   Collection Time: 06/04/19  3:39 AM   Specimen: BLOOD  Result Value Ref Range Status   Specimen Description BLOOD RIGHT [facility]  Final   Special Requests   Final    BOTTLES DRAWN AEROBIC AND ANAEROBIC Blood Culture results may not be optimal due to an inadequate volume of blood received in culture bottles   Culture   Final    NO GROWTH 5 DAYS Performed at [facility], [address] [place],  [place] [address]    Report Status 06/09/2019 FINAL  Final  Culture, blood (routine x 2)     Status: None   Collection Time: 06/04/19  3:39 AM   Specimen: BLOOD  Result Value Ref Range Status   Specimen Description BLOOD LEFT FA  Final   Special Requests   Final    BOTTLES DRAWN AEROBIC AND ANAEROBIC Blood Culture adequate volume   Culture   Final    NO GROWTH 5 DAYS Performed at [facility], [address], [place],  [address]    Report Status 06/09/2019 FINAL  Final  MRSA PCR Screening     Status: None   Collection Time: 06/04/19  4:09 AM  Specimen: Nasal Mucosa; Nasopharyngeal  Result Value Ref Range Status   MRSA by PCR NEGATIVE NEGATIVE Final    Comment:        The GeneXpert MRSA Assay (FDA approved for NASAL specimens only), is one component of a comprehensive MRSA colonization surveillance program. It is not intended to diagnose MRSA infection nor to guide or monitor treatment for MRSA infections. Performed at [facility], [address] [place], [place] [address]     Coagulation Studies: No results for input(s): LABPROT, INR in the last 72 hours.  Urinalysis: No results for input(s): COLORURINE, LABSPEC, PHURINE, GLUCOSEU, HGBUR, BILIRUBINUR, KETONESUR, PROTEINUR, UROBILINOGEN, NITRITE, LEUKOCYTESUR in the last 72 hours.  Invalid input(s): APPERANCEUR    Imaging: No results found.   Medications:    . amLODipine  10 mg Oral Daily  . aspirin EC  81 mg Oral Daily  . Chlorhexidine Gluconate Cloth  6 each Topical Q0600  . folic acid  1 mg Oral Daily  . heparin  5,000 Units Subcutaneous Q8H  . insulin aspart  0-15 Units Subcutaneous TID WC  . insulin glargine  10 Units Subcutaneous Daily  . metoprolol tartrate  50 mg Oral BID  . pantoprazole  40 mg Oral Daily  . sertraline  100 mg Oral Daily     Assessment/ Plan:  59 y.o. male with type 2 diabetes, hypertension admitted on 06/04/2019 for Acute blood loss anemia [D62] Pneumonia  [J18.9] HCAP (healthcare-associated pneumonia) [J18.9] Elevated lactic acid level [R79.89] Altered mental status, unspecified altered mental status type [R41.82] Gastrointestinal hemorrhage, unspecified gastrointestinal hemorrhage type [K92.2]  [facility] TTS [address] RIJ permcath   1.  End-stage renal disease Patient seen and evaluated during hemodialysis.  Tolerating well.  We plan to complete dialysis treatment today..  2.  Anemia of chronic kidney disease  Lab Results  Component Value Date   HGB 10.2 (L) 06/06/2019    Consider resuming Epogen as an outpatient.  3.  Secondary hyperparathyroidism Lab Results  Component Value Date   CALCIUM 7.8 (L) 06/09/2019   PHOS 1.6 (L) 06/09/2019   Phosphorus low at 1.6.  Administer sodium phosphorus 20 mmol IV x1 today.  4.  Hypokalemia: Patient switched to 4K bath today.    LOS: 5 Sadarius Norman 1/26/202111:05 AM  [place], [place]  Note: This note was prepared with Dragon dictation. Any transcription errors are unintentional

## 2019-06-09 NOTE — TOC Transition Note (Signed)
Transition of Care [facility]) - CM/SW Discharge Note   Patient Details  Name: [name] MRN: [id number] Date of Birth: 12/12/1960  [facility]) CM/SW Contact:  Beverly Sessions, RN Phone Number: 06/09/2019, 3:02 PM   Clinical Narrative:    Patient to be discharged today  Wife to transport at discharge  [facility] with [place] notified of discharge orders    Final next level of care: [place] Barriers to Discharge: No Barriers Identified   Patient Goals and CMS Choice Patient states their goals for this hospitalization and ongoing recovery are:: Per wife he needs to go into a rehab facility to get stronger and stop falling      Discharge Placement                       Discharge Plan and Services   Discharge Planning Services: CM Consult                      HH Arranged: RN, PT, Nurse's Aide, Social Work [facility] Agency: [place] ([place]) Date [place]: 06/09/19 Time [place]: 1502 Representative spoke with at [place]: [place] ([place]) Interventions     Readmission Risk Interventions Readmission Risk Prevention Plan 06/09/2019 06/05/2019  Transportation Screening - Complete  PCP or Specialist Appt within 3-5 Days Complete Complete  HRI or Home Care Consult Complete Complete  Palliative Care Screening - Not Applicable  Medication Review (RN Care Manager) - Complete  Some recent data might be hidden

## 2019-06-09 NOTE — Progress Notes (Signed)
Pt discharged per MD order. IV removed. Discharge instructions reviewed with pt. All questions answered to pt satisfaction. Pt taken to car in wheelchair by staff.  

## 2019-06-09 NOTE — Discharge Summary (Signed)
Physician Discharge Summary  [name] [id number] DOB: 12-Feb-1961 DOA: 06/04/2019  PCP: [name], MD  Admit date: 06/04/2019 Discharge date: 06/09/2019  Discharge disposition: Home with home health care   Recommendations for Outpatient Follow-Up:   Follow-up with PCP, gastroenterologist and nephrologist   Discharge Diagnosis:   Active Problems:   HTN (hypertension)   Hypoglycemia   ESRD on hemodialysis ([place])   Anemia   Frequent falls   History of CVA (cerebrovascular accident)   HCAP (healthcare-associated pneumonia)   Acute blood loss anemia   Hyperosmolar hyperglycemic state (HHS) ([place])    Discharge Condition: Stable.  Diet recommendation: Renal and diabetic diet  Code status: Full code.    Hospital Course:    [name] is a 59 y.o. male with medical history significant for  ESRD Tu/Thurs/Sat, Type 2 diabetes,ANA positive, h/o R MCA CVA w chronic bilateral ICA occlusion, hypertension and hyperlipidemia, recent hospitalization from January 13 to May 29, 2019 (for hypokalemia generalized weakness) who presented to the hospital because of recurrent falls at home.  He was found to have positive heme stools and a hemoglobin of 7.3.  Chest x-ray showed interstitial infiltrates concerning for fluid overload but pneumonia cannot be excluded.  He also had leukocytosis of 21,000.  He was treated for pneumonia with empiric IV antibiotics (Rocephin and Zithromax) and subsequently switched to Augmentin.  He was also transfused with 1 unit of packed red blood cells for acute anemia and hemoglobin improved.  He was seen in consultation by the gastroenterologist for positive heme stools.  No endoscopic work-up was recommended on this visit because H&H remained stable posttransfusion and he had colonoscopy on 03/06/2019 which showed internal hemorrhoids and polyps.  Outpatient follow-up with gastroenterologist was recommended.  He was also seen in consultation by  the nephrologist and he underwent hemodialysis for fluid overload.  Hospital course was complicated by hyperosmolar hyperglycemic state with blood glucose level going up to 775.  He was transferred to the ICU where he was treated with IV insulin infusion.  Glucose levels improved but unfortunately she developed hypoglycemia.  He was taking Lantus 15 units daily at home but this has been decreased to Lantus 10 units daily.  Glucose levels have been stable thus far.  He also had hypophosphatemia and he was treated with IV sodium phosphorus infusion.  He had diarrhea but this has improved.  He was evaluated by PT for recurrent falls at home.  Home health therapy was recommended.  His condition has improved and he is deemed stable for discharge to home.   Discharge plan was discussed with his wife.  His wife said he used to take aspirin before his stroke but after the stroke he became "moody" and stopped taking the aspirin.  Risk versus benefits of aspirin were discussed and wife wants him to take the aspirin.  She was also informed that insulin has been reduced from Lantus 15 units daily to Lantus 10 units daily.     Discharge Exam:   Vitals:   06/09/19 1323 06/09/19 1408  BP: (!) 150/83 (!) 150/94  Pulse: 72 66  Resp: 13 16  Temp: 97.9 F (36.6 C) 97.9 F (36.6 C)  SpO2: 100% 100%   Vitals:   06/09/19 1245 06/09/19 1300 06/09/19 1323 06/09/19 1408  BP: (!) 146/99 (!) 144/87 (!) 150/83 (!) 150/94  Pulse: 70 70 72 66  Resp: 18 14 13 16   Temp:   97.9 F (36.6 C) 97.9 F (36.6 C)  TempSrc:   Oral Oral  SpO2:  100% 100% 100%  Weight:   63.4 kg   Height:         GEN: NAD SKIN: No rash EYES: EOMI ENT: MMM CV: RRR PULM: CTA B ABD: soft, ND, NT, +BS CNS: AAO x 3, non focal EXT: No edema or tenderness   The results of significant diagnostics from this hospitalization (including imaging, microbiology, ancillary and laboratory) are listed below for reference.     Procedures and  Diagnostic Studies:   CT Head Wo Contrast  Result Date: 06/04/2019 CLINICAL DATA:  Encephalopathy EXAM: CT HEAD WITHOUT CONTRAST TECHNIQUE: Contiguous axial images were obtained from the base of the skull through the vertex without intravenous contrast. COMPARISON:  05/27/2019 FINDINGS: Brain: Unchanged area of encephalomalacia in the left frontal lobe. No intracranial hemorrhage or extra-axial collection. There is periventricular hypoattenuation compatible with chronic microvascular disease. Mild generalized volume loss. Old right caudate head infarct. Vascular: Atherosclerotic calcification of the internal carotid arteries at the skull base. Skull: Normal Sinuses/Orbits: Paranasal sinuses clear aside from chronic opacification within the left frontal sinus. Mastoids free of fluid. The orbits are normal. Other: None IMPRESSION: 1. No acute intracranial abnormality. 2. Old left frontal lobe infarct and findings of chronic ischemic microangiopathy. Electronically Signed   By: Ulyses Jarred M.D.   On: 06/04/2019 03:11   DG Chest Port 1 View  Result Date: 06/05/2019 CLINICAL DATA:  Pneumonia. EXAM: PORTABLE CHEST 1 VIEW COMPARISON:  06/04/2019. FINDINGS: Dialysis catheter in stable position. Cardiomegaly with pulmonary venous congestion again noted without interim change. Mild bilateral interstitial prominence again noted without interim change. Findings suggest CHF. Pneumonitis cannot be excluded. No pleural effusion or pneumothorax. No acute bony abnormality. IMPRESSION: 1.  Dialysis catheter in stable position. 2. Cardiomegaly with pulmonary venous congestion and bilateral interstitial prominence again noted without interim change. Findings suggest CHF. Pneumonitis cannot be excluded. Electronically Signed   By: Marcello Moores  Register   On: 06/05/2019 05:30   DG Chest Port 1 View  Result Date: 06/04/2019 CLINICAL DATA:  Weakness. Altered mentation. EXAM: PORTABLE CHEST 1 VIEW COMPARISON:  Radiograph and CT  05/27/2019 FINDINGS: Right dialysis catheter tip in the atrial caval junction. Lower lung volumes from prior exam. Cardiomegaly with equivocal increase. No interstitial and bronchial thickening. Small right pleural effusion no focal airspace disease. No pneumothorax. Subacute right lower rib fractures. IMPRESSION: 1. Lower lung volumes from prior exam. Cardiomegaly with interstitial and bronchial thickening, suspect fluid overload. 2. Small right pleural effusion. Electronically Signed   By: Keith Rake M.D.   On: 06/04/2019 02:02     Labs:   Basic Metabolic Panel: Recent Labs  Lab 06/04/19 0138 06/04/19 0138 06/05/19 1705 06/05/19 1750 06/05/19 1750 06/06/19 1002 06/06/19 1002 06/07/19 1333 06/09/19 0945  NA 131*  --   --  132*  --  137  --  135 135  K 3.7   < >  --  3.7   < > 2.8*   < > 3.4* 3.6  CL 95*  --   --  95*  --  101  --  100 103  CO2 26  --   --  24  --  28  --  29 27  GLUCOSE 122*   < > 771* 765*  --  337*  --  249* 268*  BUN 33*  --   --  21*  --  19  --  9 17  CREATININE 3.38*  --   --  2.80*  --  2.83*  --  2.41* 3.38*  CALCIUM 8.2*  --   --  7.9*  --  7.6*  --  7.7* 7.8*  PHOS  --   --   --   --   --   --   --   --  1.6*   < > = values in this interval not displayed.   GFR Estimated Creatinine Clearance: 21.1 mL/min (A) (by C-G formula based on SCr of 3.38 mg/dL (H)). Liver Function Tests: Recent Labs  Lab 06/04/19 0138 06/09/19 0945  AST 38  --   ALT 17  --   ALKPHOS 143*  --   BILITOT 0.8  --   PROT 5.5*  --   ALBUMIN 2.3* 1.9*   Recent Labs  Lab 06/04/19 0138  LIPASE 33   No results for input(s): AMMONIA in the last 168 hours. Coagulation profile No results for input(s): INR, PROTIME in the last 168 hours.  CBC: Recent Labs  Lab 06/04/19 0138 06/04/19 1515 06/05/19 1750 06/06/19 1002  WBC 16.9* 21.1* 12.6* 9.1  NEUTROABS 14.6*  --  10.2* 6.6  HGB 7.3* 9.1* 11.0* 10.2*  HCT 22.8* 27.4* 34.5* 31.4*  MCV 91.6 87.3 91.5 88.2  PLT  177 151 182 172   Cardiac Enzymes: No results for input(s): CKTOTAL, CKMB, CKMBINDEX, TROPONINI in the last 168 hours. BNP: Invalid input(s): POCBNP CBG: Recent Labs  Lab 06/08/19 0758 06/08/19 1210 06/08/19 1607 06/08/19 2134 06/09/19 0804  GLUCAP 82 225* 119* 192* 95   D-Dimer No results for input(s): DDIMER in the last 72 hours. Hgb A1c No results for input(s): HGBA1C in the last 72 hours. Lipid Profile No results for input(s): CHOL, HDL, LDLCALC, TRIG, CHOLHDL, LDLDIRECT in the last 72 hours. Thyroid function studies No results for input(s): TSH, T4TOTAL, T3FREE, THYROIDAB in the last 72 hours.  Invalid input(s): FREET3 Anemia work up No results for input(s): VITAMINB12, FOLATE, FERRITIN, TIBC, IRON, RETICCTPCT in the last 72 hours. Microbiology Recent Results (from the past 240 hour(s))  Respiratory Panel by RT PCR (Flu A&B, Covid) - Nasopharyngeal Swab     Status: None   Collection Time: 06/04/19  1:44 AM   Specimen: Nasopharyngeal Swab  Result Value Ref Range Status   SARS Coronavirus 2 by RT PCR NEGATIVE NEGATIVE Final    Comment: (NOTE) SARS-CoV-2 target nucleic acids are NOT DETECTED. The SARS-CoV-2 RNA is generally detectable in upper respiratoy specimens during the acute phase of infection. The lowest concentration of SARS-CoV-2 viral copies this assay can detect is 131 copies/mL. A negative result does not preclude SARS-Cov-2 infection and should not be used as the sole basis for treatment or other patient management decisions. A negative result may occur with  improper specimen collection/handling, submission of specimen other than nasopharyngeal swab, presence of viral mutation(s) within the areas targeted by this assay, and inadequate number of viral copies (<131 copies/mL). A negative result must be combined with clinical observations, patient history, and epidemiological information. The expected result is Negative. Fact Sheet for Patients:   PinkCheek.be Fact Sheet for Healthcare Providers:  GravelBags.it This test is not yet ap proved or cleared by the [place] FDA and  has been authorized for detection and/or diagnosis of SARS-CoV-2 by FDA under an Emergency Use Authorization (EUA). This EUA will remain  in effect (meaning this test can be used) for the duration of the COVID-19 declaration under Section 564(b)(1) of the Act, 21 U.S.C. section 360bbb-3(b)(1), unless the authorization  is terminated or revoked sooner.    Influenza A by PCR NEGATIVE NEGATIVE Final   Influenza B by PCR NEGATIVE NEGATIVE Final    Comment: (NOTE) The Xpert Xpress SARS-CoV-2/FLU/RSV assay is intended as an aid in  the diagnosis of influenza from Nasopharyngeal swab specimens and  should not be used as a sole basis for treatment. Nasal washings and  aspirates are unacceptable for Xpert Xpress SARS-CoV-2/FLU/RSV  testing. Fact Sheet for Patients: PinkCheek.be Fact Sheet for Healthcare Providers: GravelBags.it This test is not yet approved or cleared by the [place] FDA and  has been authorized for detection and/or diagnosis of SARS-CoV-2 by  FDA under an Emergency Use Authorization (EUA). This EUA will remain  in effect (meaning this test can be used) for the duration of the  Covid-19 declaration under Section 564(b)(1) of the Act, 21  U.S.C. section 360bbb-3(b)(1), unless the authorization is  terminated or revoked. Performed at [facility], [address] [place], [place] [address]   Culture, blood (routine x 2)     Status: None   Collection Time: 06/04/19  3:39 AM   Specimen: BLOOD  Result Value Ref Range Status   Specimen Description BLOOD RIGHT [facility]  Final   Special Requests   Final    BOTTLES DRAWN AEROBIC AND ANAEROBIC Blood Culture results may not be optimal due to an inadequate volume of blood  received in culture bottles   Culture   Final    NO GROWTH 5 DAYS Performed at [facility], [address] [place], [place] [address]    Report Status 06/09/2019 FINAL  Final  Culture, blood (routine x 2)     Status: None   Collection Time: 06/04/19  3:39 AM   Specimen: BLOOD  Result Value Ref Range Status   Specimen Description BLOOD LEFT FA  Final   Special Requests   Final    BOTTLES DRAWN AEROBIC AND ANAEROBIC Blood Culture adequate volume   Culture   Final    NO GROWTH 5 DAYS Performed at [facility], [address] [place], [place] [address]    Report Status 06/09/2019 FINAL  Final  MRSA PCR Screening     Status: None   Collection Time: 06/04/19  4:09 AM   Specimen: Nasal Mucosa; Nasopharyngeal  Result Value Ref Range Status   MRSA by PCR NEGATIVE NEGATIVE Final    Comment:        The GeneXpert MRSA Assay (FDA approved for NASAL specimens only), is one component of a comprehensive MRSA colonization surveillance program. It is not intended to diagnose MRSA infection nor to guide or monitor treatment for MRSA infections. Performed at [facility], [address] [place], [place] [address]      Discharge Instructions:   Discharge Instructions    Diet Carb Modified   Complete by: As directed    Diet renal 60/70-06-15-1198   Complete by: As directed    Discharge instructions   Complete by: As directed    Follow-up with nephrologist for continued outpatient hemodialysis as scheduled   Face-to-face encounter (required for Medicare/Medicaid patients)   Complete by: As directed    I Greycen Felter certify that this patient is under my care and that I, or a nurse practitioner or physician's assistant working with me, had a face-to-face encounter that meets the physician face-to-face encounter requirements with this patient on 06/09/2019. The encounter with the patient was in whole, or in part for the following medical condition(s) which  is  the primary reason for home health care (List medical condition): Uncontrolled diabetes mellitus, debility, recurrent falls   The encounter with the patient was in whole, or in part, for the following medical condition, which is the primary reason for home health care: Uncontrolled diabetes mellitus, debility, recurrent falls   I certify that, based on my findings, the following services are medically necessary home health services:  Nursing Physical therapy     Reason for Medically Necessary Home Health Services:  Skilled Nursing- Changes in Medication/Medication Management Therapy- Gait Training, Transfer Training and Stair Training     My clinical findings support the need for the above services: Unable to leave home safely without assistance and/or assistive device   Further, I certify that my clinical findings support that this patient is homebound due to: Unable to leave home safely without assistance   Home Health   Complete by: As directed    To provide the following care/treatments:  PT Social work OT     Increase activity slowly   Complete by: As directed      Allergies as of 06/09/2019      Reactions   Ferrous Gluconate Nausea And Vomiting      Medication List    TAKE these medications   amLODipine 5 MG tablet Commonly known as: NORVASC Take 2 tablets (10 mg total) by mouth daily.   aspirin 81 MG EC tablet Take 1 tablet (81 mg total) by mouth daily. Start taking on: January 27, [id number]   folic acid 1 MG tablet Commonly known as: FOLVITE Take 1 mg by mouth daily.   hydrocortisone 2.5 % cream Apply 1 application topically 2 (two) times daily as needed (rash).   insulin glargine 100 UNIT/ML injection Commonly known as: LANTUS Inject 0.1 mLs (10 Units total) into the skin daily. What changed: how much to take   Melatonin 3 MG Tabs Take 1 tablet by mouth at bedtime.   metoprolol tartrate 50 MG tablet Commonly known as: LOPRESSOR Take 50 mg by mouth 2 (two) times  daily.   pantoprazole 40 MG tablet Commonly known as: PROTONIX Take 40 mg by mouth daily.   sertraline 100 MG tablet Commonly known as: ZOLOFT Take 100 mg by mouth daily.      Follow-up Information    [name], MD On 06/12/2019.   Specialty: Internal Medicine Why: Go at 1:30pm. Contact information: [place] [address] [phone]            Time coordinating discharge: 35 minutes  Signed:  Jennye Boroughs  [facility] 06/09/2019, 3:30 PM

## 2019-06-09 NOTE — Progress Notes (Signed)
Physical Therapy Treatment Patient Details Name: [name] MRN: [id number] DOB: 1961/04/01 Today's Date: 06/09/2019    History of Present Illness [name] is a 59 y/o M with PMH ESRD on HD TTS (started in December 2020), T2DM, ETOH abuse, HLD, HTN, and stroke with some residual R sided weakness. Pt presented with concern for recurrent falls. Found to be hypokalemic.    PT Comments    Pt asleep upon entry, agreeable to participate. MinA to EOB, minGuard to rise to standing with RW, then pt AMB 341ft, moderate effort, safe use of RW, no frank LOB, finished last 16ft without assistive device per patient request. Pt showing improvements in strength and mobility. Pt left up in recliner with meal tray presented.   Follow Up Recommendations  Home health PT     Equipment Recommendations  None recommended by PT    Recommendations for Other Services       Precautions / Restrictions Precautions Precautions: Fall Restrictions Weight Bearing Restrictions: No    Mobility  Bed Mobility Overal bed mobility: Needs Assistance Bed Mobility: Supine to Sit     Supine to sit: Min assist     General bed mobility comments: pt pulls self to sitting holding onto authors hands  Transfers Overall transfer level: Needs assistance Equipment used: Rolling walker (2 wheeled) Transfers: Sit to/from Stand           General transfer comment: several false starts, maximal effort, slow to rise, steady once up.  Ambulation/Gait Ambulation/Gait assistance: Supervision;Min guard Gait Distance (Feet): 360 Feet(last 54ft without assistive device) Assistive device: Rolling walker (2 wheeled)   Gait velocity: 0.51m/s   General Gait Details: cadence slows toward end of walk. good control of RW, no apparent balance issues.   Stairs             Wheelchair Mobility    Modified Rankin (Stroke Patients Only)       Balance Overall balance assessment: Needs  assistance Sitting-balance support: No upper extremity supported Sitting balance-Leahy Scale: Normal Sitting balance - Comments: no issues with sitting balance, good confidence and safety   Standing balance support: During functional activity;No upper extremity supported Standing balance-Leahy Scale: Good                              Cognition Arousal/Alertness: Awake/alert Behavior During Therapy: WFL for tasks assessed/performed Overall Cognitive Status: Within Functional Limits for tasks assessed                                        Exercises      General Comments        Pertinent Vitals/Pain Pain Assessment: No/denies pain    Home Living                      Prior Function            PT Goals (current goals can now be found in the care plan section) Acute Rehab PT Goals Patient Stated Goal: improve strength PT Goal Formulation: With patient Time For Goal Achievement: 06/19/19 Potential to Achieve Goals: Good Progress towards PT goals: Progressing toward goals    Frequency    Min 2X/week      PT Plan Current plan remains appropriate    Co-evaluation  AM-PAC PT "6 Clicks" Mobility   Outcome Measure  Help needed turning from your back to your side while in a flat bed without using bedrails?: None Help needed moving from lying on your back to sitting on the side of a flat bed without using bedrails?: A Little Help needed moving to and from a bed to a chair (including a wheelchair)?: A Little Help needed standing up from a chair using your arms (e.g., wheelchair or bedside chair)?: A Little Help needed to walk in hospital room?: None Help needed climbing 3-5 steps with a railing? : A Little 6 Click Score: 20    End of Session Equipment Utilized During Treatment: Gait belt Activity Tolerance: Patient tolerated treatment well;Patient limited by fatigue Patient left: with call bell/phone within  reach;in chair;with chair alarm set Nurse Communication: Mobility status PT Visit Diagnosis: Muscle weakness (generalized) (M62.81);Repeated falls (R29.6);Difficulty in walking, not elsewhere classified (R26.2)     Time: [id number] PT Time Calculation (min) (ACUTE ONLY): 15 min  Charges:  $Therapeutic Exercise: 8-22 mins                     12:00 PM, 06/09/19 Etta Grandchild, PT, DPT Physical Therapist - [facility]  [phone] ([place])      Nelline Lio C 06/09/2019, 11:58 AM

## 2019-06-12 ENCOUNTER — Other Ambulatory Visit (INDEPENDENT_AMBULATORY_CARE_PROVIDER_SITE_OTHER): Payer: Self-pay | Admitting: Vascular Surgery

## 2019-06-12 DIAGNOSIS — N186 End stage renal disease: Secondary | ICD-10-CM

## 2019-06-15 ENCOUNTER — Ambulatory Visit (INDEPENDENT_AMBULATORY_CARE_PROVIDER_SITE_OTHER): Payer: Medicare Other | Admitting: Vascular Surgery

## 2019-06-15 ENCOUNTER — Ambulatory Visit (INDEPENDENT_AMBULATORY_CARE_PROVIDER_SITE_OTHER): Payer: Medicare Other

## 2019-06-15 ENCOUNTER — Other Ambulatory Visit: Payer: Self-pay

## 2019-06-15 ENCOUNTER — Encounter (INDEPENDENT_AMBULATORY_CARE_PROVIDER_SITE_OTHER): Payer: Self-pay | Admitting: Vascular Surgery

## 2019-06-15 VITALS — BP 145/87 | HR 70 | Resp 17 | Ht 71.0 in | Wt 142.0 lb

## 2019-06-15 DIAGNOSIS — K219 Gastro-esophageal reflux disease without esophagitis: Secondary | ICD-10-CM

## 2019-06-15 DIAGNOSIS — E08641 Diabetes mellitus due to underlying condition with hypoglycemia with coma: Secondary | ICD-10-CM

## 2019-06-15 DIAGNOSIS — N186 End stage renal disease: Secondary | ICD-10-CM | POA: Diagnosis not present

## 2019-06-15 DIAGNOSIS — I6523 Occlusion and stenosis of bilateral carotid arteries: Secondary | ICD-10-CM | POA: Diagnosis not present

## 2019-06-15 DIAGNOSIS — Z992 Dependence on renal dialysis: Secondary | ICD-10-CM

## 2019-06-15 DIAGNOSIS — N2889 Other specified disorders of kidney and ureter: Secondary | ICD-10-CM

## 2019-06-15 DIAGNOSIS — I151 Hypertension secondary to other renal disorders: Secondary | ICD-10-CM

## 2019-06-15 NOTE — Progress Notes (Signed)
MRN : [id number]  [name] is a 59 y.o. (1960/06/28) male who presents with chief complaint of No chief complaint on file. [name]  History of Present Illness:    The patient is seen for evaluation of dialysis access.  The patient has recently started HD and does not have a history of multiple failed accesses.    Current access is via a catheter, placed on 04/10/2019, which is functioning well.  He is left handed  The patient denies amaurosis fugax or recent TIA symptoms. There are no recent neurological changes noted. The patient denies claudication symptoms or rest pain symptoms. The patient denies history of DVT, PE or superficial thrombophlebitis. The patient denies recent episodes of angina or shortness of breath.   Vein mapping done today shows small veins bilaterally <2.0 mm Arterial duplex is triphasic throughout   No outpatient medications have been marked as taking for the 06/15/19 encounter (Appointment) with Delana Meyer, Dolores Lory, MD.    Past Medical History:  Diagnosis Date  . Chronic kidney disease   . Diabetes mellitus without complication ([place])   . ETOH abuse   . Hyperlipidemia   . Hypertension   . Stroke [facility])     Past Surgical History:  Procedure Laterality Date  . COLONOSCOPY WITH PROPOFOL N/A 03/06/2019   Procedure: COLONOSCOPY WITH PROPOFOL;  Surgeon: Lucilla Lame, MD;  Location: [facility] ENDOSCOPY;  Service: Endoscopy;  Laterality: N/A;  . DIALYSIS/PERMA CATHETER INSERTION N/A 04/10/2019   Procedure: DIALYSIS/PERMA CATHETER INSERTION;  Surgeon: Serafina Mitchell, MD;  Location: [facility];  Service: Cardiovascular;  Laterality: N/A;  . ESOPHAGOGASTRODUODENOSCOPY (EGD) WITH PROPOFOL N/A 07/01/2016   Procedure: ESOPHAGOGASTRODUODENOSCOPY (EGD) WITH PROPOFOL;  Surgeon: San Jetty, MD;  Location: [facility] ENDOSCOPY;  Service: General;  Laterality: N/A;    Social History Social History   Tobacco Use  . Smoking status: Current Every Day Smoker   Packs/day: 0.25    Years: 5.00    Pack years: 1.25    Types: Cigars  . Smokeless tobacco: Never Used  Substance Use Topics  . Alcohol use: Yes    Alcohol/week: 16.0 standard drinks    Types: 2 Shots of liquor, 14 Cans of beer per week  . Drug use: No    Family History Family History  Problem Relation Age of Onset  . CAD Brother   . Dementia Mother   . Renal Disease Father   No family history of bleeding/clotting disorders, porphyria or autoimmune disease   Allergies  Allergen Reactions  . Ferrous Gluconate Nausea And Vomiting     REVIEW OF SYSTEMS (Negative unless checked)  Constitutional: [] Weight loss  [] Fever  [] Chills Cardiac: [] Chest pain   [] Chest pressure   [] Palpitations   [] Shortness of breath when laying flat   [] Shortness of breath with exertion. Vascular:  [] Pain in legs with walking   [] Pain in legs at rest  [] History of DVT   [] Phlebitis   [] Swelling in legs   [] Varicose veins   [] Non-healing ulcers Pulmonary:   [] Uses home oxygen   [] Productive cough   [] Hemoptysis   [] Wheeze  [] COPD   [] Asthma Neurologic:  [] Dizziness   [] Seizures   [] History of stroke   [] History of TIA  [] Aphasia   [] Vissual changes   [] Weakness or numbness in arm   [] Weakness or numbness in leg Musculoskeletal:   [] Joint swelling   [] Joint pain   [] Low back pain Hematologic:  [] Easy bruising  [] Easy bleeding   [] Hypercoagulable state   []   Anemic Gastrointestinal:  [] Diarrhea   [] Vomiting  [x] Gastroesophageal reflux/heartburn   [] Difficulty swallowing. Genitourinary:  [x] Chronic kidney disease   [] Difficult urination  [] Frequent urination   [] Blood in urine Skin:  [] Rashes   [] Ulcers  Psychological:  [] History of anxiety   []  History of major depression.  Physical Examination  There were no vitals filed for this visit. There is no height or weight on file to calculate BMI. Gen: WD/WN, NAD Head: [place]/AT, No temporalis wasting.  Ear/Nose/Throat: Hearing grossly intact, nares w/o erythema or  drainage, poor dentition Eyes: PER, EOMI, sclera nonicteric.  Neck: Supple, no masses.  No bruit or JVD.  Pulmonary:  Good air movement, clear to auscultation bilaterally, no use of accessory muscles.  Cardiac: RRR, normal S1, S2, no Murmurs. Vascular:  Vessel Right Left  Radial Palpable Palpable  Ulnar Palpable Palpable  Gastrointestinal: soft, non-distended. No guarding/no peritoneal signs.  Musculoskeletal: M/S 5/5 throughout.  No deformity or atrophy.  Neurologic: CN 2-12 intact. Pain and light touch intact in extremities.  Symmetrical.  Speech is fluent. Motor exam as listed above. Psychiatric: Judgment intact, Mood & affect appropriate for pt's clinical situation. Dermatologic: No rashes or ulcers noted.  No changes consistent with cellulitis.  CBC Lab Results  Component Value Date   WBC 9.1 06/06/2019   HGB 10.2 (L) 06/06/2019   HCT 31.4 (L) 06/06/2019   MCV 88.2 06/06/2019   PLT 172 06/06/2019    BMET    Component Value Date/Time   NA 135 06/09/2019 0945   NA 143 01/20/2014 0443   K 3.6 06/09/2019 0945   K 4.6 01/20/2014 0443   CL 103 06/09/2019 0945   CL 116 (H) 01/20/2014 0443   CO2 27 06/09/2019 0945   CO2 22 01/20/2014 0443   GLUCOSE 268 (H) 06/09/2019 0945   GLUCOSE 94 01/20/2014 0443   BUN 17 06/09/2019 0945   BUN 18 01/20/2014 0443   CREATININE 3.38 (H) 06/09/2019 0945   CREATININE 1.32 (H) 01/20/2014 0443   CALCIUM 7.8 (L) 06/09/2019 0945   CALCIUM 7.3 (L) 01/20/2014 0443   GFRNONAA 19 (L) 06/09/2019 0945   GFRNONAA >60 01/20/2014 0443   GFRAA 22 (L) 06/09/2019 0945   GFRAA >60 01/20/2014 0443   Estimated Creatinine Clearance: 21.1 mL/min (A) (by C-G formula based on SCr of 3.38 mg/dL (H)).  COAG Lab Results  Component Value Date   INR 0.93 02/26/2018   INR 1.29 07/01/2016   INR 1.63 06/30/2016    Radiology CT ABDOMEN PELVIS WO CONTRAST  Result Date: 05/27/2019 CLINICAL DATA:  Right-sided chest pain from a fall EXAM: CT CHEST WITHOUT  CONTRAST TECHNIQUE: Multidetector CT imaging of the chest was performed following the standard protocol without IV contrast. COMPARISON:  None. FINDINGS: Cardiovascular: There is mild cardiomegaly. There is no pericardial thickening or effusion. Aortic atherosclerosis is seen. Coronary artery calcifications. Mediastinum/Nodes: A right-sided central venous catheter seen with the tip at the superior cavoatrial junction. Limited due to lack of intravenous contrast. No definite mediastinal adenopathy. The thyroid gland, trachea and esophagus demonstrate no significant findings. Lungs/Pleura: A small right pleural effusion is seen with patchy adjacent airspace opacity. No suspicious pulmonary nodules. There is no pleural effusion. Centrilobular emphysematous changes seen at the lung apices. Upper abdomen: The visualized portion of the upper abdomen is unremarkable. Musculoskeletal/Chest wall: There is no chest wall mass or suspicious osseous finding. No acute osseous abnormality Abdomen/pelvis: Hepatobiliary: Although limited due to the lack of intravenous contrast, normal in appearance without gross focal  abnormality. No evidence of calcified gallstones or biliary ductal dilatation. Pancreas: Coarse calcifications are seen, likely from prior pancreatitis. Spleen: Normal in size. Although limited due to the lack of intravenous contrast, normal in appearance. Adrenals/Urinary Tract: Both adrenal glands appear normal. Bilateral perinephric fat stranding changes are seen. Stomach/Bowel: The stomach, small bowel, and colon are normal in appearance. No inflammatory changes or obstructive findings. Vascular/Lymphatic: There are no enlarged abdominal or pelvic lymph nodes. Scattered aortic atherosclerotic calcifications are seen without aneurysmal dilatation. Reproductive: The prostate is unremarkable. Other: No evidence of abdominal wall mass or hernia. Musculoskeletal: Nondisplaced posterior right eighth rib fracture seen.  IMPRESSION: Nondisplaced posterior right eighth rib fracture. Small right pleural effusion with patchy adjacent atelectasis. Limited due to lack of intravenous contrast, however no definite acute intrathoracic, abdominal, or pelvic injury. Electronically Signed   By: Prudencio Pair M.D.   On: 05/27/2019 22:40   DG Chest 1 View  Result Date: 05/27/2019 CLINICAL DATA:  Right-sided chest pain, fall EXAM: CHEST  1 VIEW COMPARISON:  06/29/2016, 04/14/2019 FINDINGS: Right-sided central venous catheter tip over the cavoatrial region. No focal airspace disease, pleural effusion, or pneumothorax. Acute displaced right eighth and ninth lateral rib fractures IMPRESSION: 1. Negative for airspace disease or pneumothorax 2. Acute displaced right eighth and ninth rib fractures Electronically Signed   By: Donavan Foil M.D.   On: 05/27/2019 21:41   CT Head Wo Contrast  Result Date: 06/04/2019 CLINICAL DATA:  Encephalopathy EXAM: CT HEAD WITHOUT CONTRAST TECHNIQUE: Contiguous axial images were obtained from the base of the skull through the vertex without intravenous contrast. COMPARISON:  05/27/2019 FINDINGS: Brain: Unchanged area of encephalomalacia in the left frontal lobe. No intracranial hemorrhage or extra-axial collection. There is periventricular hypoattenuation compatible with chronic microvascular disease. Mild generalized volume loss. Old right caudate head infarct. Vascular: Atherosclerotic calcification of the internal carotid arteries at the skull base. Skull: Normal Sinuses/Orbits: Paranasal sinuses clear aside from chronic opacification within the left frontal sinus. Mastoids free of fluid. The orbits are normal. Other: None IMPRESSION: 1. No acute intracranial abnormality. 2. Old left frontal lobe infarct and findings of chronic ischemic microangiopathy. Electronically Signed   By: Ulyses Jarred M.D.   On: 06/04/2019 03:11   CT Head Wo Contrast  Result Date: 05/27/2019 CLINICAL DATA:  Weakness, fatigue and  fall with right-sided pain EXAM: CT HEAD WITHOUT CONTRAST CT CERVICAL SPINE WITHOUT CONTRAST TECHNIQUE: Multidetector CT imaging of the head and cervical spine was performed following the standard protocol without intravenous contrast. Multiplanar CT image reconstructions of the cervical spine were also generated. COMPARISON:  CT head 02/26/2018, MRI 12/31/2015 FINDINGS: CT HEAD FINDINGS Brain: Stable regions of encephalomalacia in the bilateral frontal lobes, insula and right caudate. Additional remote lacunar infarcts in the basal ganglia. No evidence of acute infarction, hemorrhage, hydrocephalus, extra-axial collection or mass lesion/mass effect. Symmetric prominence of the ventricles, cisterns and sulci compatible with parenchymal volume loss. Confluent areas of white matter hypoattenuation are most compatible with chronic microvascular angiopathy. Vascular: Atherosclerotic calcification of the carotid siphons and intradural vertebral arteries. No hyperdense vessel. Skull: No calvarial fracture or suspicious osseous lesion. No scalp swelling or hematoma. Sinuses/Orbits: Chronic opacification of the left frontal sinus. No air-fluid levels are seen. Remaining paranasal sinuses and mastoid air cells are predominantly clear. Included orbital structures are unremarkable. Other: Patient is edentulous. Mild temporomandibular arthrosis. CT CERVICAL SPINE FINDINGS Alignment: No acute traumatic listhesis. Craniocervical atlantoaxial alignment is maintained. Preservation of the normal cervical lordosis. Slight retrolisthesis of C5  on C6 by approximately 2 mm. Likely on a degenerative basis with spondylitic changes and facet degenerative features at this level. No abnormal facet widening, jumped or perched facets. Skull base and vertebrae: No acute fracture. No primary bone lesion or focal pathologic process. Soft tissues and spinal canal: No pre or paravertebral fluid or swelling. No visible canal hematoma. Disc levels:  Multilevel intervertebral disc height loss with spondylitic endplate changes. Additional mild uncinate spurring and minimal facet degenerative changes. No severe canal stenosis or foraminal narrowing. Upper chest: No acute abnormality in the upper chest or imaged lung apices. Other: Normal thyroid. Cervical carotid atherosclerosis. IMPRESSION: 1. No acute intracranial abnormality. No scalp swelling or calvarial fracture. 2. Stable encephalomalacia in the bilateral frontal lobes, insula and right caudate. 3. Background of moderate chronic microvascular angiopathy changes and moderate parenchymal volume loss. 4. No acute fracture or subluxation of the cervical spine. 5. Multilevel degenerative disc disease and facet degenerative changes of the cervical spine. 6. Intracranial and cervical atherosclerosis. Electronically Signed   By: Lovena Le M.D.   On: 05/27/2019 21:35   CT Chest Wo Contrast  Result Date: 05/27/2019 CLINICAL DATA:  Right-sided chest pain from a fall EXAM: CT CHEST WITHOUT CONTRAST TECHNIQUE: Multidetector CT imaging of the chest was performed following the standard protocol without IV contrast. COMPARISON:  None. FINDINGS: Cardiovascular: There is mild cardiomegaly. There is no pericardial thickening or effusion. Aortic atherosclerosis is seen. Coronary artery calcifications. Mediastinum/Nodes: A right-sided central venous catheter seen with the tip at the superior cavoatrial junction. Limited due to lack of intravenous contrast. No definite mediastinal adenopathy. The thyroid gland, trachea and esophagus demonstrate no significant findings. Lungs/Pleura: A small right pleural effusion is seen with patchy adjacent airspace opacity. No suspicious pulmonary nodules. There is no pleural effusion. Centrilobular emphysematous changes seen at the lung apices. Upper abdomen: The visualized portion of the upper abdomen is unremarkable. Musculoskeletal/Chest wall: There is no chest wall mass or  suspicious osseous finding. No acute osseous abnormality Abdomen/pelvis: Hepatobiliary: Although limited due to the lack of intravenous contrast, normal in appearance without gross focal abnormality. No evidence of calcified gallstones or biliary ductal dilatation. Pancreas: Coarse calcifications are seen, likely from prior pancreatitis. Spleen: Normal in size. Although limited due to the lack of intravenous contrast, normal in appearance. Adrenals/Urinary Tract: Both adrenal glands appear normal. Bilateral perinephric fat stranding changes are seen. Stomach/Bowel: The stomach, small bowel, and colon are normal in appearance. No inflammatory changes or obstructive findings. Vascular/Lymphatic: There are no enlarged abdominal or pelvic lymph nodes. Scattered aortic atherosclerotic calcifications are seen without aneurysmal dilatation. Reproductive: The prostate is unremarkable. Other: No evidence of abdominal wall mass or hernia. Musculoskeletal: Nondisplaced posterior right eighth rib fracture seen. IMPRESSION: Nondisplaced posterior right eighth rib fracture. Small right pleural effusion with patchy adjacent atelectasis. Limited due to lack of intravenous contrast, however no definite acute intrathoracic, abdominal, or pelvic injury. Electronically Signed   By: Prudencio Pair M.D.   On: 05/27/2019 22:40   CT Cervical Spine Wo Contrast  Result Date: 05/27/2019 CLINICAL DATA:  Weakness, fatigue and fall with right-sided pain EXAM: CT HEAD WITHOUT CONTRAST CT CERVICAL SPINE WITHOUT CONTRAST TECHNIQUE: Multidetector CT imaging of the head and cervical spine was performed following the standard protocol without intravenous contrast. Multiplanar CT image reconstructions of the cervical spine were also generated. COMPARISON:  CT head 02/26/2018, MRI 12/31/2015 FINDINGS: CT HEAD FINDINGS Brain: Stable regions of encephalomalacia in the bilateral frontal lobes, insula and right caudate. Additional remote  lacunar infarcts  in the basal ganglia. No evidence of acute infarction, hemorrhage, hydrocephalus, extra-axial collection or mass lesion/mass effect. Symmetric prominence of the ventricles, cisterns and sulci compatible with parenchymal volume loss. Confluent areas of white matter hypoattenuation are most compatible with chronic microvascular angiopathy. Vascular: Atherosclerotic calcification of the carotid siphons and intradural vertebral arteries. No hyperdense vessel. Skull: No calvarial fracture or suspicious osseous lesion. No scalp swelling or hematoma. Sinuses/Orbits: Chronic opacification of the left frontal sinus. No air-fluid levels are seen. Remaining paranasal sinuses and mastoid air cells are predominantly clear. Included orbital structures are unremarkable. Other: Patient is edentulous. Mild temporomandibular arthrosis. CT CERVICAL SPINE FINDINGS Alignment: No acute traumatic listhesis. Craniocervical atlantoaxial alignment is maintained. Preservation of the normal cervical lordosis. Slight retrolisthesis of C5 on C6 by approximately 2 mm. Likely on a degenerative basis with spondylitic changes and facet degenerative features at this level. No abnormal facet widening, jumped or perched facets. Skull base and vertebrae: No acute fracture. No primary bone lesion or focal pathologic process. Soft tissues and spinal canal: No pre or paravertebral fluid or swelling. No visible canal hematoma. Disc levels: Multilevel intervertebral disc height loss with spondylitic endplate changes. Additional mild uncinate spurring and minimal facet degenerative changes. No severe canal stenosis or foraminal narrowing. Upper chest: No acute abnormality in the upper chest or imaged lung apices. Other: Normal thyroid. Cervical carotid atherosclerosis. IMPRESSION: 1. No acute intracranial abnormality. No scalp swelling or calvarial fracture. 2. Stable encephalomalacia in the bilateral frontal lobes, insula and right caudate. 3. Background of  moderate chronic microvascular angiopathy changes and moderate parenchymal volume loss. 4. No acute fracture or subluxation of the cervical spine. 5. Multilevel degenerative disc disease and facet degenerative changes of the cervical spine. 6. Intracranial and cervical atherosclerosis. Electronically Signed   By: Lovena Le M.D.   On: 05/27/2019 21:35   DG Chest Port 1 View  Result Date: 06/05/2019 CLINICAL DATA:  Pneumonia. EXAM: PORTABLE CHEST 1 VIEW COMPARISON:  06/04/2019. FINDINGS: Dialysis catheter in stable position. Cardiomegaly with pulmonary venous congestion again noted without interim change. Mild bilateral interstitial prominence again noted without interim change. Findings suggest CHF. Pneumonitis cannot be excluded. No pleural effusion or pneumothorax. No acute bony abnormality. IMPRESSION: 1.  Dialysis catheter in stable position. 2. Cardiomegaly with pulmonary venous congestion and bilateral interstitial prominence again noted without interim change. Findings suggest CHF. Pneumonitis cannot be excluded. Electronically Signed   By: Marcello Moores  Register   On: 06/05/2019 05:30   DG Chest Port 1 View  Result Date: 06/04/2019 CLINICAL DATA:  Weakness. Altered mentation. EXAM: PORTABLE CHEST 1 VIEW COMPARISON:  Radiograph and CT 05/27/2019 FINDINGS: Right dialysis catheter tip in the atrial caval junction. Lower lung volumes from prior exam. Cardiomegaly with equivocal increase. No interstitial and bronchial thickening. Small right pleural effusion no focal airspace disease. No pneumothorax. Subacute right lower rib fractures. IMPRESSION: 1. Lower lung volumes from prior exam. Cardiomegaly with interstitial and bronchial thickening, suspect fluid overload. 2. Small right pleural effusion. Electronically Signed   By: Keith Rake M.D.   On: 06/04/2019 02:02   DG HIP UNILAT WITH PELVIS 2-3 VIEWS RIGHT  Result Date: 05/27/2019 CLINICAL DATA:  Fall with hip pain EXAM: DG HIP (WITH OR WITHOUT  PELVIS) 2-3V RIGHT COMPARISON:  CT 08/17/2017 FINDINGS: Linear object projects over the right iliac bone. The SI joints are non widened. The pubic symphysis and rami are intact. Both femoral heads project in joint. No fracture or malalignment. Vascular calcifications. IMPRESSION: 1.  No acute osseous abnormality 2. Linear object overlying the right iliac bone, either external artifact or potentially foreign body. Electronically Signed   By: Donavan Foil M.D.   On: 05/27/2019 21:43     Assessment/Plan 1. ESRD on hemodialysis ([place]) Recommend:  At this time the patient does not have appropriate extremity access for dialysis  Patient should have a right brachial axillary graft created.  The risks, benefits and alternative therapies were reviewed in detail with the patient.  All questions were answered.  The patient agrees to proceed with surgery.    2. Bilateral carotid artery stenosis Recommend:  Given the patient's asymptomatic bilateral occlusions no further invasive testing or surgery at this time.  Continue antiplatelet therapy as prescribed Continue management of CAD, HTN and Hyperlipidemia Healthy heart diet,  encouraged exercise at least 4 times per week Follow up in 12 months with duplex ultrasound and physical exam   3. Hypertension secondary to other renal disorders Continue antihypertensive medications as already ordered, these medications have been reviewed and there are no changes at this time.   4. Diabetes mellitus due to underlying condition, uncontrolled, with hypoglycemia and coma ([place]) Continue hypoglycemic medications as already ordered, these medications have been reviewed and there are no changes at this time.  Hgb A1C to be monitored as already arranged by primary service   5. Gastroesophageal reflux disease without esophagitis Continue PPI as already ordered, this medication has been reviewed and there are no changes at this time.  Avoidence of caffeine and  alcohol  Moderate elevation of the head of the bed     Hortencia Pilar, MD  06/15/2019 8:27 AM

## 2019-06-17 ENCOUNTER — Encounter: Payer: Self-pay | Admitting: *Deleted

## 2019-06-17 DIAGNOSIS — E8809 Other disorders of plasma-protein metabolism, not elsewhere classified: Secondary | ICD-10-CM | POA: Insufficient documentation

## 2019-06-17 DIAGNOSIS — R2689 Other abnormalities of gait and mobility: Secondary | ICD-10-CM | POA: Insufficient documentation

## 2019-06-17 DIAGNOSIS — R634 Abnormal weight loss: Secondary | ICD-10-CM | POA: Insufficient documentation

## 2019-06-17 DIAGNOSIS — F329 Major depressive disorder, single episode, unspecified: Secondary | ICD-10-CM | POA: Insufficient documentation

## 2019-06-22 ENCOUNTER — Ambulatory Visit: Payer: Medicare Other | Admitting: Podiatry

## 2019-06-22 ENCOUNTER — Encounter: Payer: Self-pay | Admitting: Podiatry

## 2019-06-22 ENCOUNTER — Other Ambulatory Visit: Payer: Self-pay

## 2019-06-22 DIAGNOSIS — B351 Tinea unguium: Secondary | ICD-10-CM | POA: Diagnosis not present

## 2019-06-22 DIAGNOSIS — M79674 Pain in right toe(s): Secondary | ICD-10-CM | POA: Diagnosis not present

## 2019-06-22 DIAGNOSIS — E1142 Type 2 diabetes mellitus with diabetic polyneuropathy: Secondary | ICD-10-CM

## 2019-06-22 DIAGNOSIS — M79675 Pain in left toe(s): Secondary | ICD-10-CM

## 2019-06-22 NOTE — Progress Notes (Signed)
Complaint:  Visit Type: Patient returns to my office for continued preventative foot care services. Complaint: Patient states" my nails have grown long and thick and become painful to walk and wear shoes" Patient has been diagnosed with DM with no foot complications. The patient presents for preventative foot care services. Patient has not been seen in over 1 year.  Podiatric Exam: Vascular: dorsalis pedis and posterior tibial pulses are weakly  palpable bilateral. Capillary return is immediate. Temperature gradient is WNL. Skin turgor WNL  Sensorium: Diminished  Semmes Weinstein monofilament test. Normal tactile sensation bilaterally. Nail Exam: Pt has thick disfigured discolored nails with subungual debris noted bilateral entire nail hallux through fifth toenails Ulcer Exam: There is no evidence of ulcer or pre-ulcerative changes or infection. Orthopedic Exam: Muscle tone and strength are WNL. No limitations in general ROM. No crepitus or effusions noted. Foot type and digits show no abnormalities. HAV 1st MPJ left foot. Skin: No Porokeratosis. No infection or ulcers  Diagnosis:  Onychomycosis, , Pain in right toe, pain in left toes  Treatment & Plan Procedures and Treatment: Consent by patient was obtained for treatment procedures.   Debridement of mycotic and hypertrophic toenails, 1 through 5 bilateral and clearing of subungual debris. No ulceration, no infection noted.  Return Visit-Office Procedure: Patient instructed to return to the office for a follow up visit 3 months for continued evaluation and treatment.    Gardiner Barefoot DPM

## 2019-07-02 ENCOUNTER — Ambulatory Visit: Payer: Medicare Other | Admitting: Gastroenterology

## 2019-07-05 ENCOUNTER — Emergency Department: Payer: Medicare Other

## 2019-07-05 ENCOUNTER — Inpatient Hospital Stay
Admission: EM | Admit: 2019-07-05 | Discharge: 2019-07-08 | DRG: 637 | Disposition: A | Discharge status: Home Health | Payer: Medicare Other | Attending: Internal Medicine | Admitting: Internal Medicine

## 2019-07-05 ENCOUNTER — Other Ambulatory Visit: Payer: Self-pay

## 2019-07-05 DIAGNOSIS — Z20822 Contact with and (suspected) exposure to covid-19: Secondary | ICD-10-CM | POA: Diagnosis present

## 2019-07-05 DIAGNOSIS — D631 Anemia in chronic kidney disease: Secondary | ICD-10-CM | POA: Diagnosis present

## 2019-07-05 DIAGNOSIS — F1010 Alcohol abuse, uncomplicated: Secondary | ICD-10-CM | POA: Diagnosis present

## 2019-07-05 DIAGNOSIS — Z8249 Family history of ischemic heart disease and other diseases of the circulatory system: Secondary | ICD-10-CM

## 2019-07-05 DIAGNOSIS — I161 Hypertensive emergency: Secondary | ICD-10-CM | POA: Diagnosis present

## 2019-07-05 DIAGNOSIS — Z79899 Other long term (current) drug therapy: Secondary | ICD-10-CM

## 2019-07-05 DIAGNOSIS — K219 Gastro-esophageal reflux disease without esophagitis: Secondary | ICD-10-CM | POA: Diagnosis present

## 2019-07-05 DIAGNOSIS — I639 Cerebral infarction, unspecified: Secondary | ICD-10-CM | POA: Diagnosis present

## 2019-07-05 DIAGNOSIS — Z794 Long term (current) use of insulin: Secondary | ICD-10-CM

## 2019-07-05 DIAGNOSIS — G9340 Encephalopathy, unspecified: Secondary | ICD-10-CM | POA: Diagnosis present

## 2019-07-05 DIAGNOSIS — E43 Unspecified severe protein-calorie malnutrition: Secondary | ICD-10-CM | POA: Diagnosis present

## 2019-07-05 DIAGNOSIS — R569 Unspecified convulsions: Secondary | ICD-10-CM | POA: Diagnosis present

## 2019-07-05 DIAGNOSIS — Z79890 Hormone replacement therapy (postmenopausal): Secondary | ICD-10-CM

## 2019-07-05 DIAGNOSIS — Z681 Body mass index (BMI) 19 or less, adult: Secondary | ICD-10-CM

## 2019-07-05 DIAGNOSIS — E1165 Type 2 diabetes mellitus with hyperglycemia: Secondary | ICD-10-CM | POA: Diagnosis present

## 2019-07-05 DIAGNOSIS — F17290 Nicotine dependence, other tobacco product, uncomplicated: Secondary | ICD-10-CM | POA: Diagnosis present

## 2019-07-05 DIAGNOSIS — Z992 Dependence on renal dialysis: Secondary | ICD-10-CM

## 2019-07-05 DIAGNOSIS — N186 End stage renal disease: Secondary | ICD-10-CM | POA: Diagnosis present

## 2019-07-05 DIAGNOSIS — Z8673 Personal history of transient ischemic attack (TIA), and cerebral infarction without residual deficits: Secondary | ICD-10-CM

## 2019-07-05 DIAGNOSIS — E876 Hypokalemia: Secondary | ICD-10-CM | POA: Diagnosis not present

## 2019-07-05 DIAGNOSIS — Z888 Allergy status to other drugs, medicaments and biological substances status: Secondary | ICD-10-CM

## 2019-07-05 DIAGNOSIS — I69351 Hemiplegia and hemiparesis following cerebral infarction affecting right dominant side: Secondary | ICD-10-CM

## 2019-07-05 DIAGNOSIS — E889 Metabolic disorder, unspecified: Secondary | ICD-10-CM

## 2019-07-05 DIAGNOSIS — Z9181 History of falling: Secondary | ICD-10-CM

## 2019-07-05 DIAGNOSIS — E1100 Type 2 diabetes mellitus with hyperosmolarity without nonketotic hyperglycemic-hyperosmolar coma (NKHHC): Secondary | ICD-10-CM | POA: Diagnosis not present

## 2019-07-05 DIAGNOSIS — Z841 Family history of disorders of kidney and ureter: Secondary | ICD-10-CM

## 2019-07-05 DIAGNOSIS — D509 Iron deficiency anemia, unspecified: Secondary | ICD-10-CM | POA: Diagnosis present

## 2019-07-05 DIAGNOSIS — Z7982 Long term (current) use of aspirin: Secondary | ICD-10-CM

## 2019-07-05 DIAGNOSIS — I6783 Posterior reversible encephalopathy syndrome: Secondary | ICD-10-CM | POA: Diagnosis present

## 2019-07-05 DIAGNOSIS — G9341 Metabolic encephalopathy: Secondary | ICD-10-CM | POA: Diagnosis present

## 2019-07-05 DIAGNOSIS — Z818 Family history of other mental and behavioral disorders: Secondary | ICD-10-CM

## 2019-07-05 DIAGNOSIS — R4182 Altered mental status, unspecified: Secondary | ICD-10-CM | POA: Diagnosis not present

## 2019-07-05 DIAGNOSIS — I674 Hypertensive encephalopathy: Secondary | ICD-10-CM | POA: Diagnosis present

## 2019-07-05 DIAGNOSIS — E1122 Type 2 diabetes mellitus with diabetic chronic kidney disease: Secondary | ICD-10-CM | POA: Diagnosis present

## 2019-07-05 DIAGNOSIS — IMO0002 Reserved for concepts with insufficient information to code with codable children: Secondary | ICD-10-CM | POA: Diagnosis present

## 2019-07-05 DIAGNOSIS — F331 Major depressive disorder, recurrent, moderate: Secondary | ICD-10-CM | POA: Diagnosis present

## 2019-07-05 DIAGNOSIS — N2581 Secondary hyperparathyroidism of renal origin: Secondary | ICD-10-CM | POA: Diagnosis present

## 2019-07-05 DIAGNOSIS — I120 Hypertensive chronic kidney disease with stage 5 chronic kidney disease or end stage renal disease: Secondary | ICD-10-CM | POA: Diagnosis present

## 2019-07-05 DIAGNOSIS — Z86010 Personal history of colonic polyps: Secondary | ICD-10-CM

## 2019-07-05 LAB — COMPREHENSIVE METABOLIC PANEL
ALT: 20 U/L (ref 0–44)
AST: 27 U/L (ref 15–41)
Albumin: 2.7 g/dL — ABNORMAL LOW (ref 3.5–5.0)
Alkaline Phosphatase: 267 U/L — ABNORMAL HIGH (ref 38–126)
Anion gap: 11 (ref 5–15)
BUN: 17 mg/dL (ref 6–20)
CO2: 27 mmol/L (ref 22–32)
Calcium: 8.1 mg/dL — ABNORMAL LOW (ref 8.9–10.3)
Chloride: 90 mmol/L — ABNORMAL LOW (ref 98–111)
Creatinine, Ser: 3.37 mg/dL — ABNORMAL HIGH (ref 0.61–1.24)
GFR calc Af Amer: 22 mL/min — ABNORMAL LOW (ref >60)
GFR calc non Af Amer: 19 mL/min — ABNORMAL LOW (ref >60)
Glucose, Bld: 1059 mg/dL (ref 70–99)
Potassium: 3.5 mmol/L (ref 3.5–5.1)
Sodium: 128 mmol/L — ABNORMAL LOW (ref 135–145)
Total Bilirubin: 0.7 mg/dL (ref 0.3–1.2)
Total Protein: 6.6 g/dL (ref 6.5–8.1)

## 2019-07-05 LAB — CBC WITH DIFFERENTIAL/PLATELET
Abs Immature Granulocytes: 0.03 10*3/uL (ref 0.00–0.07)
Basophils Absolute: 0 10*3/uL (ref 0.0–0.1)
Basophils Relative: 0 %
Eosinophils Absolute: 0 10*3/uL (ref 0.0–0.5)
Eosinophils Relative: 0 %
HCT: 34.9 % — ABNORMAL LOW (ref 39.0–52.0)
Hemoglobin: 11.3 g/dL — ABNORMAL LOW (ref 13.0–17.0)
Immature Granulocytes: 0 %
Lymphocytes Relative: 9 %
Lymphs Abs: 1 10*3/uL (ref 0.7–4.0)
MCH: 29 pg (ref 26.0–34.0)
MCHC: 32.4 g/dL (ref 30.0–36.0)
MCV: 89.5 fL (ref 80.0–100.0)
Monocytes Absolute: 0.5 10*3/uL (ref 0.1–1.0)
Monocytes Relative: 4 %
Neutro Abs: 8.9 10*3/uL — ABNORMAL HIGH (ref 1.7–7.7)
Neutrophils Relative %: 87 %
Platelets: 206 10*3/uL (ref 150–400)
RBC: 3.9 MIL/uL — ABNORMAL LOW (ref 4.22–5.81)
RDW: 13.9 % (ref 11.5–15.5)
WBC: 10.4 10*3/uL (ref 4.0–10.5)
nRBC: 0 % (ref 0.0–0.2)

## 2019-07-05 LAB — BETA-HYDROXYBUTYRIC ACID: Beta-Hydroxybutyric Acid: 0.35 mmol/L — ABNORMAL HIGH (ref 0.05–0.27)

## 2019-07-05 LAB — BLOOD GAS, VENOUS
Acid-Base Excess: 2.9 mmol/L — ABNORMAL HIGH (ref 0.0–2.0)
Bicarbonate: 29.8 mmol/L — ABNORMAL HIGH (ref 20.0–28.0)
O2 Saturation: 68.9 %
Patient temperature: 37
pCO2, Ven: 54 mmHg (ref 44.0–60.0)
pH, Ven: 7.35 (ref 7.250–7.430)
pO2, Ven: 38 mmHg (ref 32.0–45.0)

## 2019-07-05 LAB — BRAIN NATRIURETIC PEPTIDE: B Natriuretic Peptide: 2097 pg/mL — ABNORMAL HIGH (ref 0.0–100.0)

## 2019-07-05 LAB — GLUCOSE, CAPILLARY: Glucose-Capillary: >600 mg/dL (ref 70–99)

## 2019-07-05 MED ORDER — DEXTROSE-NACL 5-0.45 % IV SOLN: INTRAVENOUS | Status: DC

## 2019-07-05 MED ORDER — INSULIN REGULAR(HUMAN) IN NACL 100-0.9 UT/100ML-% IV SOLN
INTRAVENOUS | Status: DC
  Administered 2019-07-05: 8 [IU]/h via INTRAVENOUS
  Filled 2019-07-05: qty 100

## 2019-07-05 MED ORDER — SODIUM CHLORIDE 0.9 % IV SOLN: INTRAVENOUS | Status: DC

## 2019-07-05 MED ORDER — DEXTROSE 50 % IV SOLN
0.0000 mL | INTRAVENOUS | Status: DC | PRN
  Administered 2019-07-06: 30 mL via INTRAVENOUS
  Filled 2019-07-05: qty 50

## 2019-07-05 MED ORDER — HYDRALAZINE HCL 20 MG/ML IJ SOLN
10.0000 mg | Freq: Once | INTRAMUSCULAR | Status: AC
  Administered 2019-07-05: 10 mg via INTRAVENOUS
  Filled 2019-07-05: qty 1

## 2019-07-05 NOTE — ED Notes (Signed)
Pt transported to CT ?

## 2019-07-05 NOTE — ED Notes (Signed)
EMS and multiple RNs have attempted IV access; order for IV team consult placed.

## 2019-07-05 NOTE — ED Notes (Signed)
Date and time results received: 07/05/19 10:42 PM  Test: Blood Glucose Critical Value: 1059  Name of Provider Notified: [facility]

## 2019-07-05 NOTE — ED Notes (Signed)
NS rate change to 50 mL/hr per Dr. Joan Mayans

## 2019-07-05 NOTE — ED Triage Notes (Signed)
Pt arrived via [facility] from home.  Family found him laying on floor, had lost control of bowels and they stated it looked like he was having a seizure.  Dialysis pt, last dialysis treatment unknown.  EMS stated he was responding verbally by opening eyes, won't follow commands.  Hx of stroke with right-sided weakness, hx of diabetes.  Swelling in right hand is new.    EMS attempted IV access but were unsuccessful; BP 180/100, O2 sat mid 90s on room air, 98 HR, NSR.    Upon arrival pt will open eyes with prompting but is not speaking.

## 2019-07-05 NOTE — ED Provider Notes (Signed)
[facility] Emergency Department Provider Note  ____________________________________________   First MD Initiated Contact with Patient 07/05/19 2157     (approximate)  I have reviewed the triage vital signs and the nursing notes.  History  Chief Complaint Fall and Seizures  w  HPI [name] is a 59 y.o. male with hx of ESRD, DM, alcohol abuse, HTN, CVA with resultant R sided weakness who presents to the ED for AMS, concern for seizure like activity.   Per report, family found patient laying on the floor with seizure like activity, noted loss of bowel control. No known hx of prior seizures.   Unknown last dialysis session.   On arrival patient opens eyes to name but otherwise does not provide any history.   Caveat: hx limited due to patient presentation.    Past Medical Hx Past Medical History:  Diagnosis Date  . Chronic kidney disease   . Diabetes mellitus without complication ([place])   . ETOH abuse   . Hyperlipidemia   . Hypertension   . Stroke [facility])     Problem List Patient Active Problem List   Diagnosis Date Noted  . Hyperosmolar hyperglycemic state (HHS) ([place]) 06/05/2019  . Anemia 06/04/2019  . Frequent falls 06/04/2019  . History of CVA (cerebrovascular accident) 06/04/2019  . HCAP (healthcare-associated pneumonia) 06/04/2019  . Acute blood loss anemia 06/04/2019  . Hyperglycemia 05/28/2019  . Fall at home, initial encounter 05/28/2019  . Rib fracture 05/28/2019  . Depression 05/28/2019  . Hypokalemia 05/28/2019  . Weakness 05/27/2019  . ESRD on hemodialysis ([place])   . Acute renal failure (ARF) ([place]) 04/09/2019  . Polyp of ascending colon   . Diarrhea   . AKI (acute kidney injury) ([place]) 03/04/2019  . Acute kidney injury ([place]) 07/26/2018  . ARF (acute renal failure) ([place]) 07/25/2018  . Bilateral leg numbness 03/19/2018  . Numbness and tingling of both feet 03/19/2018  . CVA (cerebral vascular accident) ([place]) 03/17/2018  .  Moderate episode of recurrent major depressive disorder ([place]) 03/11/2018  . UTI (urinary tract infection) 02/27/2018  . Hypoglycemia 01/15/2018  . Type 2 diabetes mellitus without complication, with long-term current use of insulin ([place]) 08/27/2017  . Protein-calorie malnutrition, severe 08/19/2017  . Pancreatitis, acute 08/16/2017  . DKA (diabetic ketoacidoses) ([place]) 08/16/2017  . HTN (hypertension) 08/16/2017  . HLD (hyperlipidemia) 08/16/2017  . Carotid stenosis 08/02/2016  . GERD (gastroesophageal reflux disease) 08/02/2016  . History of esophagogastroduodenoscopy (EGD) 07/01/2016  . History of recent blood transfusion 07/01/2016  . Acute renal failure superimposed on stage 4 chronic kidney disease ([place]) 07/01/2016  . Hyponatremia 07/01/2016  . Uncontrolled diabetes mellitus ([place]) 07/01/2016  . Alcohol abuse 07/01/2016  . Monilial esophagitis ([place]) 07/01/2016  . Tobacco abuse 07/01/2016  . Confusion 06/29/2016  . Iron deficiency anemia 06/29/2016  . Coagulopathy ([place]) 06/29/2016    Past Surgical Hx Past Surgical History:  Procedure Laterality Date  . COLONOSCOPY WITH PROPOFOL N/A 03/06/2019   Procedure: COLONOSCOPY WITH PROPOFOL;  Surgeon: Lucilla Lame, MD;  Location: [facility] ENDOSCOPY;  Service: Endoscopy;  Laterality: N/A;  . DIALYSIS/PERMA CATHETER INSERTION N/A 04/10/2019   Procedure: DIALYSIS/PERMA CATHETER INSERTION;  Surgeon: Serafina Mitchell, MD;  Location: [place] CV LAB;  Service: Cardiovascular;  Laterality: N/A;  . ESOPHAGOGASTRODUODENOSCOPY (EGD) WITH PROPOFOL N/A 07/01/2016   Procedure: ESOPHAGOGASTRODUODENOSCOPY (EGD) WITH PROPOFOL;  Surgeon: San Jetty, MD;  Location: [facility] ENDOSCOPY;  Service: General;  Laterality: N/A;    Medications Prior to Admission medications  Medication Sig Start Date End Date Taking? Authorizing Provider  amLODipine (NORVASC) 5 MG tablet Take by mouth. 06/17/19   [provider]  aspirin EC 81 MG EC tablet Take 1 tablet  (81 mg total) by mouth daily. 06/10/19   Jennye Boroughs, MD  folic acid (FOLVITE) 1 MG tablet Take 1 mg by mouth daily. 03/12/19   [provider]  hydrocortisone 2.5 % cream Apply 1 application topically 2 (two) times daily as needed (rash).  04/08/19   [provider]  insulin glargine (LANTUS) 100 UNIT/ML injection Inject 0.1 mLs (10 Units total) into the skin daily. 06/09/19   Jennye Boroughs, MD  Melatonin 3 MG TABS Take 1 tablet by mouth at bedtime. 05/23/19   [provider]  metoprolol tartrate (LOPRESSOR) 50 MG tablet Take by mouth. 06/17/19   [provider]  pantoprazole (PROTONIX) 40 MG tablet Take by mouth. 06/17/19   [provider]  sertraline (ZOLOFT) 100 MG tablet Take by mouth. 06/17/19   [provider]    Allergies Ferrous gluconate  Family Hx Family History  Problem Relation Age of Onset  . CAD Brother   . Dementia Mother   . Renal Disease Father     Social Hx Social History   Tobacco Use  . Smoking status: Current Every Day Smoker    Packs/day: 0.25    Years: 5.00    Pack years: 1.25    Types: Cigars  . Smokeless tobacco: Never Used  Substance Use Topics  . Alcohol use: Yes    Alcohol/week: 16.0 standard drinks    Types: 2 Shots of liquor, 14 Cans of beer per week  . Drug use: No     Review of Systems Unable to reliably obtain due to AMS   Physical Exam  Vital Signs: ED Triage Vitals  Enc Vitals Group     BP 07/05/19 2129 (!) 198/106     Pulse Rate 07/05/19 2129 (!) 103     Resp --      Temp 07/05/19 2129 98.2 F (36.8 C)     Temp Source 07/05/19 2129 Axillary     SpO2 07/05/19 2129 95 %     Weight 07/05/19 2131 142 lb (64.4 kg)     Height 07/05/19 2131 5\' 11"  (1.803 m)     Head Circumference --      Peak Flow --      Pain Score --      Pain Loc --      Pain Edu? --      Excl. in [place]? --     Constitutional: Eyes open to voice. Does not follow commands. Protecting airway.  Head:  Normocephalic. Atraumatic. Eyes: Conjunctivae clear. Sclera anicteric. Nose: No congestion. No rhinorrhea. Mouth/Throat: Wearing mask.  Neck: No stridor.   Cardiovascular: Normal rate, regular rhythm. Extremities well perfused. Dialysis catheter in right upper chest. Respiratory: Normal respiratory effort.  Lungs CTAB. Gastrointestinal: Soft. Non-tender. Non-distended.  Musculoskeletal: Contracted RUE, swelling to the dorsum of the R hand. Neurologic:  Contracted RUE. Eyes open to voice otherwise does not follow commands or answer questions. Seems to move BLE equally. Skin: Swelling to dorsum of R hand Psychiatric: Unable to assess due to presentation  EKG  Personally reviewed.   Rate: 98 Rhythm: sinus Axis: normal Intervals: WNL No peaked T waves Non-specific ST/T changes inferior Significant artifact and wander    Radiology  CT head: IMPRESSION:  Imaging quality is significantly degraded by patient motion artifact  particularly towards the inferior temporal lobes, skull base and  posterior fossa.   No definite acute intracranial abnormality.   Stable regions of encephalomalacia in the frontal lobes, insula,  caudate and basal ganglia.   Chronic microvascular angiopathy changes and parenchymal volume  loss, similar to prior.   XR hand:  IMPRESSION:  1. Soft tissue edema. No acute bony abnormality   CXR:  IMPRESSION:  1. Vascular congestion and increased interstitial prominence  consistent with fluid overload.    Procedures  Procedure(s) performed (including critical care):  .Critical Care Performed by: Lilia Pro., MD Authorized by: Lilia Pro., MD   Critical care provider statement:    Critical care time (minutes):  45   Critical care was necessary to treat or prevent imminent or life-threatening deterioration of the following conditions:  Endocrine crisis   Critical care was time spent personally by me on the following activities:   Discussions with consultants, evaluation of patient's response to treatment, examination of patient, ordering and performing treatments and interventions, ordering and review of laboratory studies, ordering and review of radiographic studies, pulse oximetry, re-evaluation of patient's condition, obtaining history from patient or surrogate and review of old charts     Initial Impression / Assessment and Plan / ED Course  59 y.o. male who presents to the ED for AMS, possible seizure  Ddx: electrolyte abnormality, uremia, HHS/DKA, intracranial, HTN emergency  Will evaluate with labs, imaging.   Work up reveals marked hyperglycemia, glucose 1059. Presentation most consistent with HHS, likely etiology of his AMS. CT head negative. Will initiate insulin drip and plan for admission. Remains hypertensive, will give dose of IV antihypertensives and CTM. At this time, does not require oxygen and K is within normal limits, therefore anticipate dialysis tomorrow would be appropriate unless an acute changes occurs. Discussed with hospitalist for admission.    Final Clinical Impression(s) / ED Diagnosis  Final diagnoses:  Hyperosmolar hyperglycemic state (HHS) [facility])       Note:  This document was prepared using Dragon voice recognition software and may include unintentional dictation errors.   Lilia Pro., MD 07/06/19 Laureen Abrahams

## 2019-07-06 ENCOUNTER — Inpatient Hospital Stay: Payer: Medicare Other

## 2019-07-06 ENCOUNTER — Ambulatory Visit: Payer: Medicare Other | Admitting: Gastroenterology

## 2019-07-06 ENCOUNTER — Encounter: Payer: Self-pay | Admitting: Emergency Medicine

## 2019-07-06 DIAGNOSIS — G9340 Encephalopathy, unspecified: Secondary | ICD-10-CM | POA: Diagnosis not present

## 2019-07-06 DIAGNOSIS — R4182 Altered mental status, unspecified: Secondary | ICD-10-CM | POA: Diagnosis present

## 2019-07-06 DIAGNOSIS — R569 Unspecified convulsions: Secondary | ICD-10-CM | POA: Diagnosis present

## 2019-07-06 DIAGNOSIS — G9341 Metabolic encephalopathy: Secondary | ICD-10-CM

## 2019-07-06 DIAGNOSIS — N2581 Secondary hyperparathyroidism of renal origin: Secondary | ICD-10-CM | POA: Diagnosis present

## 2019-07-06 DIAGNOSIS — E43 Unspecified severe protein-calorie malnutrition: Secondary | ICD-10-CM | POA: Diagnosis present

## 2019-07-06 DIAGNOSIS — N186 End stage renal disease: Secondary | ICD-10-CM

## 2019-07-06 DIAGNOSIS — E1165 Type 2 diabetes mellitus with hyperglycemia: Secondary | ICD-10-CM

## 2019-07-06 DIAGNOSIS — E876 Hypokalemia: Secondary | ICD-10-CM | POA: Diagnosis not present

## 2019-07-06 DIAGNOSIS — F1010 Alcohol abuse, uncomplicated: Secondary | ICD-10-CM | POA: Diagnosis present

## 2019-07-06 DIAGNOSIS — D631 Anemia in chronic kidney disease: Secondary | ICD-10-CM | POA: Diagnosis present

## 2019-07-06 DIAGNOSIS — I120 Hypertensive chronic kidney disease with stage 5 chronic kidney disease or end stage renal disease: Secondary | ICD-10-CM | POA: Diagnosis present

## 2019-07-06 DIAGNOSIS — I161 Hypertensive emergency: Secondary | ICD-10-CM | POA: Diagnosis present

## 2019-07-06 DIAGNOSIS — K219 Gastro-esophageal reflux disease without esophagitis: Secondary | ICD-10-CM | POA: Diagnosis present

## 2019-07-06 DIAGNOSIS — I6783 Posterior reversible encephalopathy syndrome: Secondary | ICD-10-CM | POA: Diagnosis present

## 2019-07-06 DIAGNOSIS — E889 Metabolic disorder, unspecified: Secondary | ICD-10-CM | POA: Diagnosis not present

## 2019-07-06 DIAGNOSIS — Z992 Dependence on renal dialysis: Secondary | ICD-10-CM | POA: Diagnosis not present

## 2019-07-06 DIAGNOSIS — E1100 Type 2 diabetes mellitus with hyperosmolarity without nonketotic hyperglycemic-hyperosmolar coma (NKHHC): Principal | ICD-10-CM

## 2019-07-06 DIAGNOSIS — Z86010 Personal history of colonic polyps: Secondary | ICD-10-CM | POA: Diagnosis not present

## 2019-07-06 DIAGNOSIS — E1122 Type 2 diabetes mellitus with diabetic chronic kidney disease: Secondary | ICD-10-CM | POA: Diagnosis present

## 2019-07-06 DIAGNOSIS — I69351 Hemiplegia and hemiparesis following cerebral infarction affecting right dominant side: Secondary | ICD-10-CM | POA: Diagnosis not present

## 2019-07-06 DIAGNOSIS — Z9181 History of falling: Secondary | ICD-10-CM | POA: Diagnosis not present

## 2019-07-06 DIAGNOSIS — Z681 Body mass index (BMI) 19 or less, adult: Secondary | ICD-10-CM | POA: Diagnosis not present

## 2019-07-06 DIAGNOSIS — F331 Major depressive disorder, recurrent, moderate: Secondary | ICD-10-CM | POA: Diagnosis present

## 2019-07-06 DIAGNOSIS — Z20822 Contact with and (suspected) exposure to covid-19: Secondary | ICD-10-CM | POA: Diagnosis present

## 2019-07-06 DIAGNOSIS — I674 Hypertensive encephalopathy: Secondary | ICD-10-CM | POA: Diagnosis present

## 2019-07-06 DIAGNOSIS — D509 Iron deficiency anemia, unspecified: Secondary | ICD-10-CM | POA: Diagnosis present

## 2019-07-06 DIAGNOSIS — F17290 Nicotine dependence, other tobacco product, uncomplicated: Secondary | ICD-10-CM | POA: Diagnosis present

## 2019-07-06 LAB — GLUCOSE, CAPILLARY
Glucose-Capillary: 113 mg/dL — ABNORMAL HIGH (ref 70–99)
Glucose-Capillary: 114 mg/dL — ABNORMAL HIGH (ref 70–99)
Glucose-Capillary: 116 mg/dL — ABNORMAL HIGH (ref 70–99)
Glucose-Capillary: 121 mg/dL — ABNORMAL HIGH (ref 70–99)
Glucose-Capillary: 132 mg/dL — ABNORMAL HIGH (ref 70–99)
Glucose-Capillary: 138 mg/dL — ABNORMAL HIGH (ref 70–99)
Glucose-Capillary: 170 mg/dL — ABNORMAL HIGH (ref 70–99)
Glucose-Capillary: 170 mg/dL — ABNORMAL HIGH (ref 70–99)
Glucose-Capillary: 312 mg/dL — ABNORMAL HIGH (ref 70–99)
Glucose-Capillary: 424 mg/dL — ABNORMAL HIGH (ref 70–99)
Glucose-Capillary: 562 mg/dL (ref 70–99)
Glucose-Capillary: 65 mg/dL — ABNORMAL LOW (ref 70–99)
Glucose-Capillary: 94 mg/dL (ref 70–99)
Glucose-Capillary: >600 mg/dL (ref 70–99)
Glucose-Capillary: >600 mg/dL (ref 70–99)

## 2019-07-06 LAB — URINE DRUG SCREEN, QUALITATIVE (ARMC ONLY)
Amphetamines, Ur Screen: NOT DETECTED
Barbiturates, Ur Screen: NOT DETECTED
Benzodiazepine, Ur Scrn: NOT DETECTED
Cannabinoid 50 Ng, Ur ~~LOC~~: NOT DETECTED
Cocaine Metabolite,Ur ~~LOC~~: NOT DETECTED
MDMA (Ecstasy)Ur Screen: NOT DETECTED
Methadone Scn, Ur: NOT DETECTED
Opiate, Ur Screen: NOT DETECTED
Phencyclidine (PCP) Ur S: NOT DETECTED
Tricyclic, Ur Screen: NOT DETECTED

## 2019-07-06 LAB — ETHANOL: Alcohol, Ethyl (B): <10 mg/dL (ref <10)

## 2019-07-06 LAB — CBC
HCT: 28.7 % — ABNORMAL LOW (ref 39.0–52.0)
Hemoglobin: 9.8 g/dL — ABNORMAL LOW (ref 13.0–17.0)
MCH: 29.4 pg (ref 26.0–34.0)
MCHC: 34.1 g/dL (ref 30.0–36.0)
MCV: 86.2 fL (ref 80.0–100.0)
Platelets: 210 10*3/uL (ref 150–400)
RBC: 3.33 MIL/uL — ABNORMAL LOW (ref 4.22–5.81)
RDW: 14 % (ref 11.5–15.5)
WBC: 12.5 10*3/uL — ABNORMAL HIGH (ref 4.0–10.5)
nRBC: 0 % (ref 0.0–0.2)

## 2019-07-06 LAB — URINALYSIS, COMPLETE (UACMP) WITH MICROSCOPIC
Bilirubin Urine: NEGATIVE
Glucose, UA: >=500 mg/dL — AB
Ketones, ur: NEGATIVE mg/dL
Leukocytes,Ua: NEGATIVE
Nitrite: NEGATIVE
Protein, ur: >=300 mg/dL — AB
Specific Gravity, Urine: 1.017 (ref 1.005–1.030)
pH: 7 (ref 5.0–8.0)

## 2019-07-06 LAB — COMPREHENSIVE METABOLIC PANEL
ALT: 18 U/L (ref 0–44)
AST: 30 U/L (ref 15–41)
Albumin: 2.6 g/dL — ABNORMAL LOW (ref 3.5–5.0)
Alkaline Phosphatase: 241 U/L — ABNORMAL HIGH (ref 38–126)
Anion gap: 14 (ref 5–15)
BUN: 19 mg/dL (ref 6–20)
CO2: 23 mmol/L (ref 22–32)
Calcium: 8.5 mg/dL — ABNORMAL LOW (ref 8.9–10.3)
Chloride: 98 mmol/L (ref 98–111)
Creatinine, Ser: 3.44 mg/dL — ABNORMAL HIGH (ref 0.61–1.24)
GFR calc Af Amer: 21 mL/min — ABNORMAL LOW (ref >60)
GFR calc non Af Amer: 18 mL/min — ABNORMAL LOW (ref >60)
Glucose, Bld: 453 mg/dL — ABNORMAL HIGH (ref 70–99)
Potassium: 2.9 mmol/L — ABNORMAL LOW (ref 3.5–5.1)
Sodium: 135 mmol/L (ref 135–145)
Total Bilirubin: 0.5 mg/dL (ref 0.3–1.2)
Total Protein: 6.4 g/dL — ABNORMAL LOW (ref 6.5–8.1)

## 2019-07-06 LAB — RESPIRATORY PANEL BY RT PCR (FLU A&B, COVID)
Influenza A by PCR: NEGATIVE
Influenza B by PCR: NEGATIVE
SARS Coronavirus 2 by RT PCR: NEGATIVE

## 2019-07-06 LAB — SALICYLATE LEVEL: Salicylate Lvl: <7 mg/dL — ABNORMAL LOW (ref 7.0–30.0)

## 2019-07-06 LAB — PHOSPHORUS: Phosphorus: 3.4 mg/dL (ref 2.5–4.6)

## 2019-07-06 LAB — ACETAMINOPHEN LEVEL: Acetaminophen (Tylenol), Serum: <10 ug/mL — ABNORMAL LOW (ref 10–30)

## 2019-07-06 LAB — MAGNESIUM: Magnesium: 1.7 mg/dL (ref 1.7–2.4)

## 2019-07-06 LAB — OSMOLALITY: Osmolality: 304 mOsm/kg — ABNORMAL HIGH (ref 275–295)

## 2019-07-06 LAB — MRSA PCR SCREENING: MRSA by PCR: NEGATIVE

## 2019-07-06 MED ORDER — LEVETIRACETAM IN NACL 1000 MG/100ML IV SOLN
1000.0000 mg | Freq: Once | INTRAVENOUS | Status: AC
  Administered 2019-07-06: 1000 mg via INTRAVENOUS
  Filled 2019-07-06: qty 100

## 2019-07-06 MED ORDER — METOPROLOL TARTRATE 50 MG PO TABS
50.0000 mg | ORAL_TABLET | Freq: Two times a day (BID) | ORAL | Status: DC
  Administered 2019-07-06 – 2019-07-08 (×4): 50 mg via ORAL
  Filled 2019-07-06 (×4): qty 1

## 2019-07-06 MED ORDER — LEVETIRACETAM IN NACL 500 MG/100ML IV SOLN
500.0000 mg | Freq: Two times a day (BID) | INTRAVENOUS | Status: DC
  Administered 2019-07-06 – 2019-07-08 (×5): 500 mg via INTRAVENOUS
  Filled 2019-07-06 (×6): qty 100

## 2019-07-06 MED ORDER — THIAMINE HCL 100 MG/ML IJ SOLN
500.0000 mg | Freq: Every day | INTRAVENOUS | Status: DC
  Administered 2019-07-06: 500 mg via INTRAVENOUS
  Filled 2019-07-06 (×2): qty 5

## 2019-07-06 MED ORDER — LORAZEPAM 2 MG/ML IJ SOLN
1.0000 mg | Freq: Once | INTRAMUSCULAR | Status: AC
  Administered 2019-07-06: 1 mg via INTRAVENOUS
  Filled 2019-07-06: qty 1

## 2019-07-06 MED ORDER — AMLODIPINE BESYLATE 10 MG PO TABS
10.0000 mg | ORAL_TABLET | Freq: Every day | ORAL | Status: DC
  Administered 2019-07-06 – 2019-07-08 (×3): 10 mg via ORAL
  Filled 2019-07-06 (×3): qty 1

## 2019-07-06 MED ORDER — SODIUM CHLORIDE 0.9% FLUSH
3.0000 mL | Freq: Two times a day (BID) | INTRAVENOUS | Status: DC
  Administered 2019-07-06 – 2019-07-08 (×6): 3 mL via INTRAVENOUS

## 2019-07-06 MED ORDER — SODIUM CHLORIDE 0.9% FLUSH: 3.0000 mL | INTRAVENOUS | Status: DC | PRN

## 2019-07-06 MED ORDER — LABETALOL HCL 5 MG/ML IV SOLN: 10.0000 mg | INTRAVENOUS | Status: DC | PRN

## 2019-07-06 MED ORDER — ONDANSETRON HCL 4 MG PO TABS: 4.0000 mg | ORAL_TABLET | Freq: Four times a day (QID) | ORAL | Status: DC | PRN

## 2019-07-06 MED ORDER — SODIUM CHLORIDE 0.9% FLUSH
3.0000 mL | Freq: Two times a day (BID) | INTRAVENOUS | Status: DC
  Administered 2019-07-06 – 2019-07-08 (×5): 3 mL via INTRAVENOUS

## 2019-07-06 MED ORDER — HYDRALAZINE HCL 20 MG/ML IJ SOLN
10.0000 mg | INTRAMUSCULAR | Status: DC | PRN
  Administered 2019-07-06 – 2019-07-08 (×5): 10 mg via INTRAVENOUS
  Filled 2019-07-06 (×4): qty 1

## 2019-07-06 MED ORDER — SODIUM CHLORIDE 0.9 % IV SOLN: INTRAVENOUS | Status: DC

## 2019-07-06 MED ORDER — LABETALOL HCL 5 MG/ML IV SOLN
10.0000 mg | INTRAVENOUS | Status: DC | PRN
  Administered 2019-07-06 – 2019-07-08 (×3): 10 mg via INTRAVENOUS
  Filled 2019-07-06 (×4): qty 4

## 2019-07-06 MED ORDER — INSULIN ASPART 100 UNIT/ML ~~LOC~~ SOLN
0.0000 [IU] | Freq: Three times a day (TID) | SUBCUTANEOUS | Status: DC
  Administered 2019-07-06: 2 [IU] via SUBCUTANEOUS
  Administered 2019-07-06 – 2019-07-07 (×2): 1 [IU] via SUBCUTANEOUS
  Administered 2019-07-07: 5 [IU] via SUBCUTANEOUS
  Administered 2019-07-08: 2 [IU] via SUBCUTANEOUS
  Administered 2019-07-08: 7 [IU] via SUBCUTANEOUS
  Administered 2019-07-08: 9 [IU] via SUBCUTANEOUS
  Filled 2019-07-06 (×7): qty 1

## 2019-07-06 MED ORDER — CHLORHEXIDINE GLUCONATE CLOTH 2 % EX PADS
6.0000 | MEDICATED_PAD | Freq: Every day | CUTANEOUS | Status: DC
  Administered 2019-07-06 – 2019-07-08 (×3): 6 via TOPICAL

## 2019-07-06 MED ORDER — LABETALOL HCL 5 MG/ML IV SOLN
10.0000 mg | Freq: Once | INTRAVENOUS | Status: AC
  Administered 2019-07-06: 10 mg via INTRAVENOUS
  Filled 2019-07-06: qty 4

## 2019-07-06 MED ORDER — AMLODIPINE BESYLATE 5 MG PO TABS: 5.0000 mg | ORAL_TABLET | Freq: Every day | ORAL | Status: DC

## 2019-07-06 MED ORDER — SODIUM CHLORIDE 0.9 % IV SOLN: 250.0000 mL | INTRAVENOUS | Status: DC | PRN

## 2019-07-06 MED ORDER — ONDANSETRON HCL 4 MG/2ML IJ SOLN: 4.0000 mg | Freq: Four times a day (QID) | INTRAMUSCULAR | Status: DC | PRN

## 2019-07-06 MED ORDER — HYDRALAZINE HCL 20 MG/ML IJ SOLN
10.0000 mg | INTRAMUSCULAR | Status: DC | PRN
  Administered 2019-07-06: 10 mg via INTRAVENOUS
  Filled 2019-07-06 (×2): qty 1

## 2019-07-06 MED ORDER — HEPARIN SODIUM (PORCINE) 5000 UNIT/ML IJ SOLN
5000.0000 [IU] | Freq: Three times a day (TID) | INTRAMUSCULAR | Status: DC
  Administered 2019-07-06 – 2019-07-08 (×8): 5000 [IU] via SUBCUTANEOUS
  Filled 2019-07-06 (×8): qty 1

## 2019-07-06 MED ORDER — LORAZEPAM 2 MG/ML IJ SOLN: 1.0000 mg | Freq: Once | INTRAMUSCULAR | Status: DC | PRN

## 2019-07-06 NOTE — Progress Notes (Signed)
[facility]  ROUNDING NOTE   Subjective:   [name] admitted to [facility] on 07/05/2019 for Acute encephalopathy [G93.40] Hyperosmolar hyperglycemic state (HHS) ([place]) [E11.00, E11.65]  Last hemodialysis on 2/20.   Objective:  Vital signs in last 24 hours:  Temp:  [98.2 F (36.8 C)-99.8 F (37.7 C)] 98.6 F (37 C) (02/22 0800) Pulse Rate:  [78-106] 82 (02/22 0900) Resp:  [15-26] 17 (02/22 0900) BP: (158-248)/(85-205) 168/97 (02/22 0900) SpO2:  [86 %-100 %] 99 % (02/22 0900) Weight:  [62.6 kg-64.4 kg] 62.6 kg (02/22 0138)  Weight change:  Filed Weights   07/05/19 2131 07/06/19 0138  Weight: 64.4 kg 62.6 kg    Intake/Output: I/O last 3 completed shifts: In: 438.3 [I.V.:338.3; IV Piggyback:100] Out: -    Intake/Output this shift:  No intake/output data recorded.  Physical Exam: General: NAD, laying in bed  Head: Normocephalic, atraumatic. Moist oral mucosal membranes  Eyes: Anicteric, PERRL  Neck: Supple, trachea midline  Lungs:  Clear to auscultation  Heart: Regular rate and rhythm  Abdomen:  Soft, nontender,   Extremities: no peripheral edema.  Neurologic: Verbal, not alert or oriented.   Skin: No lesions  Access: RIJ permcath    Basic Metabolic Panel: Recent Labs  Lab 07/05/19 2156 07/06/19 0244  NA 128* 135  K 3.5 2.9*  CL 90* 98  CO2 27 23  GLUCOSE 1,059* 453*  BUN 17 19  CREATININE 3.37* 3.44*  CALCIUM 8.1* 8.5*  MG  --  1.7  PHOS  --  3.4    Liver Function Tests: Recent Labs  Lab 07/05/19 2156 07/06/19 0244  AST 27 30  ALT 20 18  ALKPHOS 267* 241*  BILITOT 0.7 0.5  PROT 6.6 6.4*  ALBUMIN 2.7* 2.6*   No results for input(s): LIPASE, AMYLASE in the last 168 hours. No results for input(s): AMMONIA in the last 168 hours.  CBC: Recent Labs  Lab 07/05/19 2156 07/06/19 0244  WBC 10.4 12.5*  NEUTROABS 8.9*  --   HGB 11.3* 9.8*  HCT 34.9* 28.7*  MCV 89.5 86.2  PLT 206 210    Cardiac Enzymes: No results for  input(s): CKTOTAL, CKMB, CKMBINDEX, TROPONINI in the last 168 hours.  BNP: Invalid input(s): POCBNP  CBG: Recent Labs  Lab 07/06/19 0449 07/06/19 0552 07/06/19 0645 07/06/19 0748 07/06/19 0902  GLUCAP 170* 94 65* 114* 113*    Microbiology: Results for orders placed or performed during the hospital encounter of 07/05/19  Respiratory Panel by RT PCR (Flu A&B, Covid) - Nasopharyngeal Swab     Status: None   Collection Time: 07/05/19 11:14 PM   Specimen: Nasopharyngeal Swab  Result Value Ref Range Status   SARS Coronavirus 2 by RT PCR NEGATIVE NEGATIVE Final    Comment: (NOTE) SARS-CoV-2 target nucleic acids are NOT DETECTED. The SARS-CoV-2 RNA is generally detectable in upper respiratoy specimens during the acute phase of infection. The lowest concentration of SARS-CoV-2 viral copies this assay can detect is 131 copies/mL. A negative result does not preclude SARS-Cov-2 infection and should not be used as the sole basis for treatment or other patient management decisions. A negative result may occur with  improper specimen collection/handling, submission of specimen other than nasopharyngeal swab, presence of viral mutation(s) within the areas targeted by this assay, and inadequate number of viral copies (<131 copies/mL). A negative result must be combined with clinical observations, patient history, and epidemiological information. The expected result is Negative. Fact Sheet for Patients:  PinkCheek.be Fact  Sheet for Healthcare Providers:  GravelBags.it This test is not yet ap proved or cleared by the [place] FDA and  has been authorized for detection and/or diagnosis of SARS-CoV-2 by FDA under an Emergency Use Authorization (EUA). This EUA will remain  in effect (meaning this test can be used) for the duration of the COVID-19 declaration under Section 564(b)(1) of the Act, 21 U.S.C. section 360bbb-3(b)(1),  unless the authorization is terminated or revoked sooner.    Influenza A by PCR NEGATIVE NEGATIVE Final   Influenza B by PCR NEGATIVE NEGATIVE Final    Comment: (NOTE) The Xpert Xpress SARS-CoV-2/FLU/RSV assay is intended as an aid in  the diagnosis of influenza from Nasopharyngeal swab specimens and  should not be used as a sole basis for treatment. Nasal washings and  aspirates are unacceptable for Xpert Xpress SARS-CoV-2/FLU/RSV  testing. Fact Sheet for Patients: PinkCheek.be Fact Sheet for Healthcare Providers: GravelBags.it This test is not yet approved or cleared by the [place] FDA and  has been authorized for detection and/or diagnosis of SARS-CoV-2 by  FDA under an Emergency Use Authorization (EUA). This EUA will remain  in effect (meaning this test can be used) for the duration of the  Covid-19 declaration under Section 564(b)(1) of the Act, 21  U.S.C. section 360bbb-3(b)(1), unless the authorization is  terminated or revoked. Performed at [facility], [address] [place], [place] [address]   MRSA PCR Screening     Status: None   Collection Time: 07/06/19  1:44 AM   Specimen: Nasal Mucosa; Nasopharyngeal  Result Value Ref Range Status   MRSA by PCR NEGATIVE NEGATIVE Final    Comment:        The GeneXpert MRSA Assay (FDA approved for NASAL specimens only), is one component of a comprehensive MRSA colonization surveillance program. It is not intended to diagnose MRSA infection nor to guide or monitor treatment for MRSA infections. Performed at [facility], [address] [place], [place] [address]     Coagulation Studies: No results for input(s): LABPROT, INR in the last 72 hours.  Urinalysis: Recent Labs    07/06/19 0144  COLORURINE YELLOW*  LABSPEC 1.017  PHURINE 7.0  GLUCOSEU >=500*  HGBUR SMALL*  BILIRUBINUR NEGATIVE  KETONESUR NEGATIVE  PROTEINUR >=300*   NITRITE NEGATIVE  LEUKOCYTESUR NEGATIVE      Imaging: CT Head Wo Contrast  Result Date: 07/05/2019 CLINICAL DATA:  Altered mental status EXAM: CT HEAD WITHOUT CONTRAST TECHNIQUE: Contiguous axial images were obtained from the base of the skull through the vertex without intravenous contrast. COMPARISON:  CT head 06/04/2019 FINDINGS: Brain: Stable regions of encephalomalacia in the bilateral frontal lobes, insula and right caudate likely as a result of prior ischemic insult. Additional areas of remote lacunar infarct again seen in the basal ganglia. No convincing areas of acute CT evident infarction. Evaluation of the inferior temporal lobes and cerebellum are limited due to motion artifact. Patchy areas of white matter hypoattenuation are most compatible with chronic microvascular angiopathy. Symmetric prominence of the ventricles, cisterns and sulci compatible with parenchymal volume loss. Vascular: Atherosclerotic calcification of the carotid siphons and intradural vertebral arteries. No hyperdense vessel though evaluation of the skull base is limited due to motion. Skull: Motion artifact may limit detection of subtle osseous injuries. Sinuses/Orbits: Persistent chronic opacification of the left frontal sinus. No air-fluid levels. Remaining paranasal sinuses and mastoid air cells are grossly clear. Orbital structures are difficult to evaluate in the setting of motion. Other: None  IMPRESSION: Imaging quality is significantly degraded by patient motion artifact particularly towards the inferior temporal lobes, skull base and posterior fossa. No definite acute intracranial abnormality. Stable regions of encephalomalacia in the frontal lobes, insula, caudate and basal ganglia. Chronic microvascular angiopathy changes and parenchymal volume loss, similar to prior. Electronically Signed   By: Lovena Le M.D.   On: 07/05/2019 22:58   DG Hand 2 View Right  Result Date: 07/05/2019 CLINICAL DATA:  Found  down, right hand swelling and weakness EXAM: RIGHT HAND - 2 VIEW COMPARISON:  None. FINDINGS: Frontal and lateral views of the right hand are obtained. There are no acute displaced fractures alignment is grossly anatomic. Joint spaces are well preserved. Mild diffuse soft tissue edema is noted. IMPRESSION: 1. Soft tissue edema.  No acute bony abnormality Electronically Signed   By: Randa Ngo M.D.   On: 07/05/2019 22:37   DG Chest Port 1 View  Result Date: 07/05/2019 CLINICAL DATA:  Altered level of consciousness, found down, end-stage renal disease EXAM: PORTABLE CHEST 1 VIEW COMPARISON:  06/05/2019 FINDINGS: Two frontal views of the chest demonstrate right-sided dialysis catheter unchanged. Cardiac silhouette is stable. There is chronic central vascular congestion, with increased interstitial prominence likely representing fluid overload. No effusion or pneumothorax. No acute bony abnormalities. IMPRESSION: 1. Vascular congestion and increased interstitial prominence consistent with fluid overload. Electronically Signed   By: Randa Ngo M.D.   On: 07/05/2019 22:38     Medications:   . sodium chloride 50 mL/hr at 07/06/19 0209  . sodium chloride    . dextrose 5 % and 0.45% NaCl 50 mL/hr at 07/06/19 [id number]  . insulin 0.4 Units/hr (07/06/19 0800)   . heparin  5,000 Units Subcutaneous Q8H  . sodium chloride flush  3 mL Intravenous Q12H  . sodium chloride flush  3 mL Intravenous Q12H   sodium chloride, dextrose, hydrALAZINE, labetalol, ondansetron **OR** ondansetron (ZOFRAN) IV, sodium chloride flush  Assessment/ Plan:  [name] is a 59 y.o. black male with end stage renal disease on hemodialysis, diabetes mellitus type II, hypertension, CVA, hyperlipidemia, EtOH abuse who is admitted to [facility] on 07/05/2019 for Acute encephalopathy [G93.40] Hyperosmolar hyperglycemic state (HHS) ([place]) [E11.00, E11.65]  [facility] TTS [address] RIJ permcath  1. End Stage Renal Disease: last  hemodialysis treatment was Saturday 2/20.  Next treatment for tomorrow.  2. Hypertension: 167/97. Hypertensive urgency on admission. Home regimen of amlodipine and metoprolol.  Has not well controlled blood pressures as an outpatient.  - IV labetalol PRN  3. Anemia of chronic kidney disease: hemoglobin 9.8.  - EPO with HD treatment  4. Secondary Hyperparathyroidism: outpatient labs on 06/23/19: PTH 353, phos 4.8, calcium 8.1. Not currently on binders. Recently started on cinacalcet.    LOS: 0 Charlett Merkle 2/22/20219:30 AM

## 2019-07-06 NOTE — Consult Note (Addendum)
Requesting Physician: Dr. Mal Misty    Chief Complaint: Altered mental status  History obtained from: Patient and Chart    HPI:                                                                                                                                       [name] is a 59 y.o. male with past medical history significant for prior CVA with residual right-sided weakness, hypertension, hyperlipidemia, alcohol abuse, diabetes mellitus, CKD on dialysis presented to the emergency department yesterday with altered mental status in the setting of hyper osmolar hyperglycemic state.  There was questionable seizure activity with noted loss of bowel control for family prior to arrival.   Blood pressure in the emergency department was [id number] systolic.  Sugar was 1000.  UDS was negative and EtOH was less than 10.  Sodium was 128.  Patient was started on insulin drip for HHS.  He was given 1 g of Keppra empirically.  He has not had no further clinical seizures since arrival.  Neurology was consulted for further recommendations regarding altered mental status and seizure.  On assessment today, patient is significantly encephalopathic and speaking incoherently and not following any commands.    Past Medical History:  Diagnosis Date  . Chronic kidney disease   . Diabetes mellitus without complication ([place])   . ETOH abuse   . Hyperlipidemia   . Hypertension   . Stroke [facility])     Past Surgical History:  Procedure Laterality Date  . COLONOSCOPY WITH PROPOFOL N/A 03/06/2019   Procedure: COLONOSCOPY WITH PROPOFOL;  Surgeon: Lucilla Lame, MD;  Location: [facility] ENDOSCOPY;  Service: Endoscopy;  Laterality: N/A;  . DIALYSIS/PERMA CATHETER INSERTION N/A 04/10/2019   Procedure: DIALYSIS/PERMA CATHETER INSERTION;  Surgeon: Serafina Mitchell, MD;  Location: [facility] LAB;  Service: Cardiovascular;  Laterality: N/A;  . ESOPHAGOGASTRODUODENOSCOPY (EGD) WITH PROPOFOL N/A 07/01/2016   Procedure:  ESOPHAGOGASTRODUODENOSCOPY (EGD) WITH PROPOFOL;  Surgeon: San Jetty, MD;  Location: [facility] ENDOSCOPY;  Service: General;  Laterality: N/A;    Family History  Problem Relation Age of Onset  . CAD Brother   . Dementia Mother   . Renal Disease Father    Social History:  reports that he has been smoking cigars. He has a 1.25 pack-year smoking history. He has never used smokeless tobacco. He reports current alcohol use of about 16.0 standard drinks of alcohol per week. He reports that he does not use drugs.  Allergies:  Allergies  Allergen Reactions  . Ferrous Gluconate Nausea And Vomiting    Medications:  I reviewed home medications   ROS:                                                                                                                                     Unable to obtain due to altered mental status   Examination:                                                                                                      General: Appears well-developed and well-nourished.  Psych: Altered, unable to obtain  Eyes: No scleral injection HENT: No OP obstrucion Head: Normocephalic.  Cardiovascular: Normal rate and regular rhythm.  Respiratory: Effort normal and breath sounds normal to anterior ascultation GI: Soft.  No distension. There is no tenderness.  Skin: WDI    Neurological Examination Mental Status: Patient is alert, his speech is word salad.  Unable to state his name or age. Cranial Nerves: II: Visual fields : Reduced blink to threat bilaterally III,IV, VI: ptosis not present, extra-ocular motions intact bilaterally, pupils equal, round, reactive to light and accommodation V,VII: Face appears symmetric , unable to assess facial sensation VIII: hearing normal bilaterally IX,X: uvula rises symmetrically XI: bilateral shoulder shrug XII: midline  tongue extension Motor: Moves all 4 extremities antigravity Tone and bulk:normal tone throughout; no atrophy noted Sensory: Withdraws to noxious stimulus bilaterally Deep Tendon Reflexes: 2+ and symmetric throughout Plantars: Right: downgoing   Left: downgoing Cerebellar: normal finger-to-nose      Lab Results: Basic Metabolic Panel: Recent Labs  Lab 07/05/19 2156 07/06/19 0244  NA 128* 135  K 3.5 2.9*  CL 90* 98  CO2 27 23  GLUCOSE 1,059* 453*  BUN 17 19  CREATININE 3.37* 3.44*  CALCIUM 8.1* 8.5*  MG  --  1.7  PHOS  --  3.4    CBC: Recent Labs  Lab 07/05/19 2156 07/06/19 0244  WBC 10.4 12.5*  NEUTROABS 8.9*  --   HGB 11.3* 9.8*  HCT 34.9* 28.7*  MCV 89.5 86.2  PLT 206 210    Coagulation Studies: No results for input(s): LABPROT, INR in the last 72 hours.  Imaging: CT Head Wo Contrast  Result Date: 07/05/2019 CLINICAL DATA:  Altered mental status EXAM: CT HEAD WITHOUT CONTRAST TECHNIQUE: Contiguous axial images were obtained from the base of the skull through the vertex without intravenous contrast. COMPARISON:  CT head 06/04/2019 FINDINGS: Brain: Stable regions of encephalomalacia in the bilateral frontal lobes, insula and right caudate likely as a result of prior ischemic  insult. Additional areas of remote lacunar infarct again seen in the basal ganglia. No convincing areas of acute CT evident infarction. Evaluation of the inferior temporal lobes and cerebellum are limited due to motion artifact. Patchy areas of white matter hypoattenuation are most compatible with chronic microvascular angiopathy. Symmetric prominence of the ventricles, cisterns and sulci compatible with parenchymal volume loss. Vascular: Atherosclerotic calcification of the carotid siphons and intradural vertebral arteries. No hyperdense vessel though evaluation of the skull base is limited due to motion. Skull: Motion artifact may limit detection of subtle osseous injuries. Sinuses/Orbits:  Persistent chronic opacification of the left frontal sinus. No air-fluid levels. Remaining paranasal sinuses and mastoid air cells are grossly clear. Orbital structures are difficult to evaluate in the setting of motion. Other: None IMPRESSION: Imaging quality is significantly degraded by patient motion artifact particularly towards the inferior temporal lobes, skull base and posterior fossa. No definite acute intracranial abnormality. Stable regions of encephalomalacia in the frontal lobes, insula, caudate and basal ganglia. Chronic microvascular angiopathy changes and parenchymal volume loss, similar to prior. Electronically Signed   By: Lovena Le M.D.   On: 07/05/2019 22:58   DG Hand 2 View Right  Result Date: 07/05/2019 CLINICAL DATA:  Found down, right hand swelling and weakness EXAM: RIGHT HAND - 2 VIEW COMPARISON:  None. FINDINGS: Frontal and lateral views of the right hand are obtained. There are no acute displaced fractures alignment is grossly anatomic. Joint spaces are well preserved. Mild diffuse soft tissue edema is noted. IMPRESSION: 1. Soft tissue edema.  No acute bony abnormality Electronically Signed   By: Randa Ngo M.D.   On: 07/05/2019 22:37   DG Chest Port 1 View  Result Date: 07/05/2019 CLINICAL DATA:  Altered level of consciousness, found down, end-stage renal disease EXAM: PORTABLE CHEST 1 VIEW COMPARISON:  06/05/2019 FINDINGS: Two frontal views of the chest demonstrate right-sided dialysis catheter unchanged. Cardiac silhouette is stable. There is chronic central vascular congestion, with increased interstitial prominence likely representing fluid overload. No effusion or pneumothorax. No acute bony abnormalities. IMPRESSION: 1. Vascular congestion and increased interstitial prominence consistent with fluid overload. Electronically Signed   By: Randa Ngo M.D.   On: 07/05/2019 22:38     I have reviewed the above imaging : CT Head unremarkable  MRI Brain: new area of  edema in the R [place]-occipital lobe concerning for PRES, less likely Seizure however    ASSESSMENT AND PLAN  59 y.o. male with past medical history significant for prior CVA with residual right-sided weakness, hypertension, hyperlipidemia, alcohol abuse, diabetes mellitus, CKD on dialysis presented to the emergency department yesterday with altered mental status and seizure in the setting of hyper osmolar hyperglycemic state.  Acute metabolic encephalopathy Hypertensive encephalopathy,  Posterior Reversible Encephalopathy Syndrome  Seizure-likely provoked   Recommendations BP goal less 140 -160 SBP, improving - consider cardene gtt if difficult to control Continue to treat electrolyte and metabolic conditions High dose thiamine and B1 level  MRI brain to look for PRES, stroke  EEG to r/o seizures Continue Keppra 500mg   BID Seizure precautions    CRITICAL CARE Performed by: Lanice Schwab Lord Lancour   Total critical care time: 45  minutes  Critical care time was exclusive of separately billable procedures and treating other patients.  Critical care was necessary to treat or prevent imminent or life-threatening deterioration.  Critical care was time spent personally by me on the following activities: development of treatment plan with patient and/or surrogate as well as nursing, discussions  with consultants, evaluation of patient's response to treatment, examination of patient, obtaining history from patient or surrogate, ordering and performing treatments and interventions, ordering and review of laboratory studies, ordering and review of radiographic studies, pulse oximetry and re-evaluation of patient's condition.  Giulia Hickey [facility] Pager Number [phone]

## 2019-07-06 NOTE — Progress Notes (Addendum)
Progress Note    [name]  [id number] DOB: [birth year]  DOA: 07/05/2019 PCP: [name], MD      Brief Narrative:    Medical records reviewed and are as summarized below:  [name] is an 59 y.o. 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  He was hospitalized from January 21 through June 09, 2019 and he was treated for pneumonia, fluid overload, severe hyperglycemia requiring insulin infusion, episodes of hypoglycemia requiring treatment and severe anemia requiring blood transfusion.  He was brought to the hospital because of altered mental status or seizure-like activity.  His blood pressure was severely elevated and blood glucose was also severely elevated in the thousands but he was not in DKA.  He was admitted to the hospital for suspected acute stroke versus seizure activity.      Assessment/Plan:   Principal Problem:   Hyperosmolar hyperglycemic state (HHS) ([place]) Active Problems:   Iron deficiency anemia   Uncontrolled diabetes mellitus (HCC)   Alcohol abuse   GERD (gastroesophageal reflux disease)   CVA (cerebral vascular accident) ([place])   ESRD on hemodialysis ([place])   History of CVA (cerebrovascular accident)   Moderate episode of recurrent major depressive disorder ([place])   Acute metabolic encephalopathy   Hypertensive emergency   Acute encephalopathy   Seizures due to metabolic disorder ([place])   Altered mental status/disorientation: Differential diagnosis include seizures with postictal state, metabolic encephalopathy, hypertensive encephalopathy and acute stroke. MRI brain has been ordered to rule out acute stroke.  Patient was loaded with Keppra in the emergency room.  Neurologist has been consulted for further evaluation.  Consult speech therapist for swallow  evaluation  Insulin-dependent type II brittle DM with severe hyperglycemia hyperosmolar hyperglycemic state: Discontinue IV insulin infusion.  NovoLog as needed for hyperglycemia.  Hold Lantus for now since patient is n.p.o. Patient has a history of going from very high glucose level to very low glucose level.  Monitor glucose levels closely.  Hypertensive emergency: Blood pressure is improving.  Monitor BP off of antihypertensives for now pending MRI brain.  ESRD on hemodialysis: Nephrologist has been consulted for hemodialysis.  Hyperglycemia-induced hyponatremia: Improved  Hypokalemia: Defer to nephrologist for repletion.  History of stroke: Patient was on aspirin at home.    Body mass index is 17.72 kg/m.   Family Communication/Anticipated D/C date and plan/Code Status   DVT prophylaxis: Heparin Code Status: Full code Family Communication: Plan discussed with his wife. Disposition Plan: Possible discharge to home in 2 to 3 days      Subjective:   Patient is confused and unable to provide any history  Objective:    Vitals:   07/06/19 0600 07/06/19 0700 07/06/19 0800 07/06/19 0900  BP: (!) 158/124 (!) 169/86 (!) 165/87 (!) 168/97  Pulse: 88 78 87 82  Resp: (!) 23 20 19 17   Temp:   98.6 F (37 C)   TempSrc:   Axillary   SpO2: 99% 99% 99% 99%  Weight:      Height:        Intake/Output Summary (Last 24 hours) at 07/06/2019 1100 Last data filed at 07/06/2019 [id number] Gross per 24 hour  Intake 438.29 ml  Output --  Net 438.29 ml   Filed Weights   07/05/19 2131 07/06/19 0138  Weight: 64.4 kg 62.6 kg    Exam:  GEN: NAD SKIN: No  rash EYES: EOMI ENT: MMM CV: RRR PULM: CTA B ABD: soft, ND, NT, +BS CNS: Alert but confused, he moves all extremities spontaneously.  Does not follow commands.  EXT: No edema or tenderness   Data Reviewed:   I have personally reviewed following labs and imaging studies:  Labs: Labs show the following:   Basic Metabolic  Panel: Recent Labs  Lab 07/05/19 2156 07/06/19 0244  NA 128* 135  K 3.5 2.9*  CL 90* 98  CO2 27 23  GLUCOSE 1,059* 453*  BUN 17 19  CREATININE 3.37* 3.44*  CALCIUM 8.1* 8.5*  MG  --  1.7  PHOS  --  3.4   GFR Estimated Creatinine Clearance: 20.5 mL/min (A) (by C-G formula based on SCr of 3.44 mg/dL (H)). Liver Function Tests: Recent Labs  Lab 07/05/19 2156 07/06/19 0244  AST 27 30  ALT 20 18  ALKPHOS 267* 241*  BILITOT 0.7 0.5  PROT 6.6 6.4*  ALBUMIN 2.7* 2.6*   No results for input(s): LIPASE, AMYLASE in the last 168 hours. No results for input(s): AMMONIA in the last 168 hours. Coagulation profile No results for input(s): INR, PROTIME in the last 168 hours.  CBC: Recent Labs  Lab 07/05/19 2156 07/06/19 0244  WBC 10.4 12.5*  NEUTROABS 8.9*  --   HGB 11.3* 9.8*  HCT 34.9* 28.7*  MCV 89.5 86.2  PLT 206 210   Cardiac Enzymes: No results for input(s): CKTOTAL, CKMB, CKMBINDEX, TROPONINI in the last 168 hours. BNP (last 3 results) No results for input(s): PROBNP in the last 8760 hours. CBG: Recent Labs  Lab 07/06/19 0552 07/06/19 0645 07/06/19 0748 07/06/19 0902 07/06/19 1001  GLUCAP 94 65* 114* 113* 116*   D-Dimer: No results for input(s): DDIMER in the last 72 hours. Hgb A1c: No results for input(s): HGBA1C in the last 72 hours. Lipid Profile: No results for input(s): CHOL, HDL, LDLCALC, TRIG, CHOLHDL, LDLDIRECT in the last 72 hours. Thyroid function studies: No results for input(s): TSH, T4TOTAL, T3FREE, THYROIDAB in the last 72 hours.  Invalid input(s): FREET3 Anemia work up: No results for input(s): VITAMINB12, FOLATE, FERRITIN, TIBC, IRON, RETICCTPCT in the last 72 hours. Sepsis Labs: Recent Labs  Lab 07/05/19 2156 07/06/19 0244  WBC 10.4 12.5*    Microbiology Recent Results (from the past 240 hour(s))  Respiratory Panel by RT PCR (Flu A&B, Covid) - Nasopharyngeal Swab     Status: None   Collection Time: 07/05/19 11:14 PM    Specimen: Nasopharyngeal Swab  Result Value Ref Range Status   SARS Coronavirus 2 by RT PCR NEGATIVE NEGATIVE Final    Comment: (NOTE) SARS-CoV-2 target nucleic acids are NOT DETECTED. The SARS-CoV-2 RNA is generally detectable in upper respiratoy specimens during the acute phase of infection. The lowest concentration of SARS-CoV-2 viral copies this assay can detect is 131 copies/mL. A negative result does not preclude SARS-Cov-2 infection and should not be used as the sole basis for treatment or other patient management decisions. A negative result may occur with  improper specimen collection/handling, submission of specimen other than nasopharyngeal swab, presence of viral mutation(s) within the areas targeted by this assay, and inadequate number of viral copies (<131 copies/mL). A negative result must be combined with clinical observations, patient history, and epidemiological information. The expected result is Negative. Fact Sheet for Patients:  PinkCheek.be Fact Sheet for Healthcare Providers:  GravelBags.it This test is not yet ap proved or cleared by the [place] and  has been authorized for  detection and/or diagnosis of SARS-CoV-2 by FDA under an Emergency Use Authorization (EUA). This EUA will remain  in effect (meaning this test can be used) for the duration of the COVID-19 declaration under Section 564(b)(1) of the Act, 21 U.S.C. section 360bbb-3(b)(1), unless the authorization is terminated or revoked sooner.    Influenza A by PCR NEGATIVE NEGATIVE Final   Influenza B by PCR NEGATIVE NEGATIVE Final    Comment: (NOTE) The Xpert Xpress SARS-CoV-2/FLU/RSV assay is intended as an aid in  the diagnosis of influenza from Nasopharyngeal swab specimens and  should not be used as a sole basis for treatment. Nasal washings and  aspirates are unacceptable for Xpert Xpress SARS-CoV-2/FLU/RSV  testing. Fact Sheet  for Patients: PinkCheek.be Fact Sheet for Healthcare Providers: GravelBags.it This test is not yet approved or cleared by the [place] FDA and  has been authorized for detection and/or diagnosis of SARS-CoV-2 by  FDA under an Emergency Use Authorization (EUA). This EUA will remain  in effect (meaning this test can be used) for the duration of the  Covid-19 declaration under Section 564(b)(1) of the Act, 21  U.S.C. section 360bbb-3(b)(1), unless the authorization is  terminated or revoked. Performed at [facility], [address] [place],  [address]   MRSA PCR Screening     Status: None   Collection Time: 07/06/19  1:44 AM   Specimen: Nasal Mucosa; Nasopharyngeal  Result Value Ref Range Status   MRSA by PCR NEGATIVE NEGATIVE Final    Comment:        The GeneXpert MRSA Assay (FDA approved for NASAL specimens only), is one component of a comprehensive MRSA colonization surveillance program. It is not intended to diagnose MRSA infection nor to guide or monitor treatment for MRSA infections. Performed at [facility], [address] [place],  [address]     Procedures and diagnostic studies:  CT Head Wo Contrast  Result Date: 07/05/2019 CLINICAL DATA:  Altered mental status EXAM: CT HEAD WITHOUT CONTRAST TECHNIQUE: Contiguous axial images were obtained from the base of the skull through the vertex without intravenous contrast. COMPARISON:  CT head 06/04/2019 FINDINGS: Brain: Stable regions of encephalomalacia in the bilateral frontal lobes, insula and right caudate likely as a result of prior ischemic insult. Additional areas of remote lacunar infarct again seen in the basal ganglia. No convincing areas of acute CT evident infarction. Evaluation of the inferior temporal lobes and cerebellum are limited due to motion artifact. Patchy areas of white matter hypoattenuation are most  compatible with chronic microvascular angiopathy. Symmetric prominence of the ventricles, cisterns and sulci compatible with parenchymal volume loss. Vascular: Atherosclerotic calcification of the carotid siphons and intradural vertebral arteries. No hyperdense vessel though evaluation of the skull base is limited due to motion. Skull: Motion artifact may limit detection of subtle osseous injuries. Sinuses/Orbits: Persistent chronic opacification of the left frontal sinus. No air-fluid levels. Remaining paranasal sinuses and mastoid air cells are grossly clear. Orbital structures are difficult to evaluate in the setting of motion. Other: None IMPRESSION: Imaging quality is significantly degraded by patient motion artifact particularly towards the inferior temporal lobes, skull base and posterior fossa. No definite acute intracranial abnormality. Stable regions of encephalomalacia in the frontal lobes, insula, caudate and basal ganglia. Chronic microvascular angiopathy changes and parenchymal volume loss, similar to prior. Electronically Signed   By: Lovena Le M.D.   On: 07/05/2019 22:58   DG Hand 2 View Right  Result Date: 07/05/2019 CLINICAL DATA:  Found  down, right hand swelling and weakness EXAM: RIGHT HAND - 2 VIEW COMPARISON:  None. FINDINGS: Frontal and lateral views of the right hand are obtained. There are no acute displaced fractures alignment is grossly anatomic. Joint spaces are well preserved. Mild diffuse soft tissue edema is noted. IMPRESSION: 1. Soft tissue edema.  No acute bony abnormality Electronically Signed   By: Randa Ngo M.D.   On: 07/05/2019 22:37   DG Chest Port 1 View  Result Date: 07/05/2019 CLINICAL DATA:  Altered level of consciousness, found down, end-stage renal disease EXAM: PORTABLE CHEST 1 VIEW COMPARISON:  06/05/2019 FINDINGS: Two frontal views of the chest demonstrate right-sided dialysis catheter unchanged. Cardiac silhouette is stable. There is chronic central  vascular congestion, with increased interstitial prominence likely representing fluid overload. No effusion or pneumothorax. No acute bony abnormalities. IMPRESSION: 1. Vascular congestion and increased interstitial prominence consistent with fluid overload. Electronically Signed   By: Randa Ngo M.D.   On: 07/05/2019 22:38    Medications:   . heparin  5,000 Units Subcutaneous Q8H  . insulin aspart  0-9 Units Subcutaneous TID WC  . LORazepam  1 mg Intravenous Once  . sodium chloride flush  3 mL Intravenous Q12H  . sodium chloride flush  3 mL Intravenous Q12H   Continuous Infusions: . sodium chloride    . thiamine injection       LOS: 0 days   Madelyn Tlatelpa  [facility]     07/06/2019, 11:00 AM

## 2019-07-06 NOTE — H&P (Signed)
History and Physical    DOUGAL PAOLILLO [id number] DOB: 1961/04/27 DOA: 07/05/2019  PCP: [name], MD    Patient coming from: Home   Chief Complaint: Fall and seizures   HPI: [name] is a 59 y.o. male with medical history significant of hypertension, end-stage renal disease on dialysis, diabetes mellitus type 2, depression, anemia, alcohol abuse, history of's CVA with right-sided weakness Who came with the emergency room with change in mental status possible seizures like activity Per family was found lying on the floor with seizures like activity, noted loss of bowel control.  No no history of prior seizures Unknown last dialysis session. ED Course:  In the emergency room patient is encephalopathic does not follow commands Open his eyes to painful stimulus Blood pressure 200/134 Blood sugar 1000 no anion gap CT of the head no acute pathology Urine drug screen, alcohol level, salicylate pending Patient started on Endo tool protocol by emergency room physician Patient end-stage renal disease on dialysis not sure when was the last dialysis Does not look in congestive heart failure congestive heart failure   Review of Systems: Unable to evaluate because of the patient mental status.   Past Medical History:  Diagnosis Date  . Chronic kidney disease   . Diabetes mellitus without complication ([place])   . ETOH abuse   . Hyperlipidemia   . Hypertension   . Stroke [facility])     Past Surgical History:  Procedure Laterality Date  . COLONOSCOPY WITH PROPOFOL N/A 03/06/2019   Procedure: COLONOSCOPY WITH PROPOFOL;  Surgeon: Lucilla Lame, MD;  Location: [facility] ENDOSCOPY;  Service: Endoscopy;  Laterality: N/A;  . DIALYSIS/PERMA CATHETER INSERTION N/A 04/10/2019   Procedure: DIALYSIS/PERMA CATHETER INSERTION;  Surgeon: Serafina Mitchell, MD;  Location: [place] CV LAB;  Service: Cardiovascular;  Laterality: N/A;  . ESOPHAGOGASTRODUODENOSCOPY (EGD) WITH PROPOFOL N/A  07/01/2016   Procedure: ESOPHAGOGASTRODUODENOSCOPY (EGD) WITH PROPOFOL;  Surgeon: San Jetty, MD;  Location: [facility] ENDOSCOPY;  Service: General;  Laterality: N/A;     reports that he has been smoking cigars. He has a 1.25 pack-year smoking history. He has never used smokeless tobacco. He reports current alcohol use of about 16.0 standard drinks of alcohol per week. He reports that he does not use drugs.  Allergies  Allergen Reactions  . Ferrous Gluconate Nausea And Vomiting    Family History  Problem Relation Age of Onset  . CAD Brother   . Dementia Mother   . Renal Disease Father      Prior to Admission medications   Medication Sig Start Date End Date Taking? Authorizing Provider  amLODipine (NORVASC) 5 MG tablet Take by mouth. 06/17/19   [provider]  aspirin EC 81 MG EC tablet Take 1 tablet (81 mg total) by mouth daily. 06/10/19   Jennye Boroughs, MD  folic acid (FOLVITE) 1 MG tablet Take 1 mg by mouth daily. 03/12/19   [provider]  hydrocortisone 2.5 % cream Apply 1 application topically 2 (two) times daily as needed (rash).  04/08/19   [provider]  insulin glargine (LANTUS) 100 UNIT/ML injection Inject 0.1 mLs (10 Units total) into the skin daily. 06/09/19   Jennye Boroughs, MD  Melatonin 3 MG TABS Take 1 tablet by mouth at bedtime. 05/23/19   [provider]  metoprolol tartrate (LOPRESSOR) 50 MG tablet Take by mouth. 06/17/19   [provider]  pantoprazole (PROTONIX) 40 MG tablet Take by mouth. 06/17/19   [provider]  sertraline (ZOLOFT)  100 MG tablet Take by mouth. 06/17/19   [provider]    Physical Exam: Vitals:   07/06/19 0000 07/06/19 0015 07/06/19 0030 07/06/19 0045  BP: (!) 201/133 (!) 194/125 (!) 216/135 (!) 248/205  Pulse: 99 100 (!) 104 (!) 102  Resp: (!) 23 19 (!) 22 (!) 23  Temp:      TempSrc:      SpO2: 95% 95% 98% 99%  Weight:      Height:        Constitutional: NAD, calm,  comfortable Vitals:   07/06/19 0000 07/06/19 0015 07/06/19 0030 07/06/19 0045  BP: (!) 201/133 (!) 194/125 (!) 216/135 (!) 248/205  Pulse: 99 100 (!) 104 (!) 102  Resp: (!) 23 19 (!) 22 (!) 23  Temp:      TempSrc:      SpO2: 95% 95% 98% 99%  Weight:      Height:       Eyes: PERRL, lids and conjunctivae normal ENMT: Mucous membranes are moist. Posterior pharynx clear of any exudate or lesions.Normal dentition.  Neck: normal, supple, no masses, no thyromegaly Respiratory: clear to auscultation bilaterally, no wheezing, no crackles. Normal respiratory effort. No accessory muscle use.  Cardiovascular: Regular rate and rhythm, no murmurs / rubs / gallops. No extremity edema. 2+ pedal pulses. No carotid bruits.  Abdomen: no tenderness, no masses palpated. No hepatosplenomegaly. Bowel sounds positive.  Musculoskeletal: no clubbing / cyanosis. No joint deformity upper and lower extremities. Good ROM, no contractures. Normal muscle tone.  Skin: no rashes, lesions, ulcers. No induration Neurologic: Unable to evaluate patient respond and open eyes with painful stimulus Psychiatry unable to evaluate   Labs on Admission: I have personally reviewed following labs and imaging studies  CBC: Recent Labs  Lab 07/05/19 2156  WBC 10.4  NEUTROABS 8.9*  HGB 11.3*  HCT 34.9*  MCV 89.5  PLT 99991111   Basic Metabolic Panel: Recent Labs  Lab 07/05/19 2156  NA 128*  K 3.5  CL 90*  CO2 27  GLUCOSE 1,059*  BUN 17  CREATININE 3.37*  CALCIUM 8.1*   GFR: Estimated Creatinine Clearance: 21.5 mL/min (A) (by C-G formula based on SCr of 3.37 mg/dL (H)). Liver Function Tests: Recent Labs  Lab 07/05/19 2156  AST 27  ALT 20  ALKPHOS 267*  BILITOT 0.7  PROT 6.6  ALBUMIN 2.7*   No results for input(s): LIPASE, AMYLASE in the last 168 hours. No results for input(s): AMMONIA in the last 168 hours. Coagulation Profile: No results for input(s): INR, PROTIME in the last 168 hours. Cardiac  Enzymes: No results for input(s): CKTOTAL, CKMB, CKMBINDEX, TROPONINI in the last 168 hours. BNP (last 3 results) No results for input(s): PROBNP in the last 8760 hours. HbA1C: No results for input(s): HGBA1C in the last 72 hours. CBG: Recent Labs  Lab 07/05/19 2135 07/06/19 0035 07/06/19 0106  GLUCAP >600* >600* >600*   Lipid Profile: No results for input(s): CHOL, HDL, LDLCALC, TRIG, CHOLHDL, LDLDIRECT in the last 72 hours. Thyroid Function Tests: No results for input(s): TSH, T4TOTAL, FREET4, T3FREE, THYROIDAB in the last 72 hours. Anemia Panel: No results for input(s): VITAMINB12, FOLATE, FERRITIN, TIBC, IRON, RETICCTPCT in the last 72 hours. Urine analysis:    Component Value Date/Time   COLORURINE YELLOW (A) 06/05/2019 1300   APPEARANCEUR HAZY (A) 06/05/2019 1300   APPEARANCEUR Clear 01/17/2014 1551   LABSPEC 1.015 06/05/2019 1300   LABSPEC 1.011 01/17/2014 1551   PHURINE 6.0 06/05/2019 1300   GLUCOSEU >=  500 (A) 06/05/2019 1300   GLUCOSEU Negative 01/17/2014 1551   HGBUR SMALL (A) 06/05/2019 1300   BILIRUBINUR NEGATIVE 06/05/2019 1300   BILIRUBINUR Negative 01/17/2014 1551   KETONESUR NEGATIVE 06/05/2019 1300   PROTEINUR 100 (A) 06/05/2019 1300   NITRITE NEGATIVE 06/05/2019 1300   LEUKOCYTESUR NEGATIVE 06/05/2019 1300   LEUKOCYTESUR 1+ 01/17/2014 1551    Radiological Exams on Admission: CT Head Wo Contrast  Result Date: 07/05/2019 CLINICAL DATA:  Altered mental status EXAM: CT HEAD WITHOUT CONTRAST TECHNIQUE: Contiguous axial images were obtained from the base of the skull through the vertex without intravenous contrast. COMPARISON:  CT head 06/04/2019 FINDINGS: Brain: Stable regions of encephalomalacia in the bilateral frontal lobes, insula and right caudate likely as a result of prior ischemic insult. Additional areas of remote lacunar infarct again seen in the basal ganglia. No convincing areas of acute CT evident infarction. Evaluation of the inferior temporal  lobes and cerebellum are limited due to motion artifact. Patchy areas of white matter hypoattenuation are most compatible with chronic microvascular angiopathy. Symmetric prominence of the ventricles, cisterns and sulci compatible with parenchymal volume loss. Vascular: Atherosclerotic calcification of the carotid siphons and intradural vertebral arteries. No hyperdense vessel though evaluation of the skull base is limited due to motion. Skull: Motion artifact may limit detection of subtle osseous injuries. Sinuses/Orbits: Persistent chronic opacification of the left frontal sinus. No air-fluid levels. Remaining paranasal sinuses and mastoid air cells are grossly clear. Orbital structures are difficult to evaluate in the setting of motion. Other: None IMPRESSION: Imaging quality is significantly degraded by patient motion artifact particularly towards the inferior temporal lobes, skull base and posterior fossa. No definite acute intracranial abnormality. Stable regions of encephalomalacia in the frontal lobes, insula, caudate and basal ganglia. Chronic microvascular angiopathy changes and parenchymal volume loss, similar to prior. Electronically Signed   By: Lovena Le M.D.   On: 07/05/2019 22:58   DG Hand 2 View Right  Result Date: 07/05/2019 CLINICAL DATA:  Found down, right hand swelling and weakness EXAM: RIGHT HAND - 2 VIEW COMPARISON:  None. FINDINGS: Frontal and lateral views of the right hand are obtained. There are no acute displaced fractures alignment is grossly anatomic. Joint spaces are well preserved. Mild diffuse soft tissue edema is noted. IMPRESSION: 1. Soft tissue edema.  No acute bony abnormality Electronically Signed   By: Randa Ngo M.D.   On: 07/05/2019 22:37   DG Chest Port 1 View  Result Date: 07/05/2019 CLINICAL DATA:  Altered level of consciousness, found down, end-stage renal disease EXAM: PORTABLE CHEST 1 VIEW COMPARISON:  06/05/2019 FINDINGS: Two frontal views of the chest  demonstrate right-sided dialysis catheter unchanged. Cardiac silhouette is stable. There is chronic central vascular congestion, with increased interstitial prominence likely representing fluid overload. No effusion or pneumothorax. No acute bony abnormalities. IMPRESSION: 1. Vascular congestion and increased interstitial prominence consistent with fluid overload. Electronically Signed   By: Randa Ngo M.D.   On: 07/05/2019 22:38    EKG: Independently reviewed.  Normal sinus no acute ST-T changes  Acute encephalopathy Patien with  change in mental status respond to painful stimulus  Possible seizures at home, postictal per family history And differential diagnosis seizure secondary to HHS, hypertensive encephalopathy, postictal No history of seizures  CT scan of the head negative for acute pathology Patient with history of CVA may need an MRI Plan treat HSS on insulin drips, control blood pressure, neurology consult, EEG, empiric 1 g of Keppra  Hyperosmolar hyperglycemic state  Blood sugar 1059, potassium 3.5 sodium 128 BUN 17 creatinine 3.3 anion gap 11 Plan Endo tool protocol will be different than her regular person patient is on dialysis I suggest when blood sugar around 400s change to sliding scale BMP every 4 hours, Accu-Cheks every hour  Hypertensive emergency with encephalopathy Labetalol hydralazine IV as needed If not controlled change to Cardene drips  Seizures New onset Possible secondary to hypertension, HHS, new CVA, CT scan of the head negative for acute pathology History of alcohol abuse Not sure when had the last drink Ativan IV as needed I will hold the withdrawal alcohol protocol Keppra 1000 mg 1 dose Neurology consult EEG if he does not get better needs MRI, patient  possible intubation potential  End-stage renal dialysis Not sure when she was dialyzed last time Plan nephrology consult evaluation for dialysis in the morning No need for emergency dialysis  tonight  History of CVA Resume home medication when patient can take p.o. On aspirin 81 mg p.o.  GERD PPI  Anemia secondary to end-stage renal disease and iron deficiency Resume home medication I do not see any listed medication  Depression Zoloft  Assessment/Plan Principal Problem:   Hyperosmolar hyperglycemic state (HHS) ([place]) Active Problems:   Iron deficiency anemia   Uncontrolled diabetes mellitus (H[facility])   Alcohol abuse   GERD (gastroesophageal reflux disease)   CVA (cerebral vascular accident) ([place])   ESRD on hemodialysis ([place])   History of CVA (cerebrovascular accident)   Moderate episode of recurrent major depressive disorder ([place])   Acute metabolic encephalopathy   Hypertensive emergency   Acute encephalopathy      DVT prophylaxis: Heparin subcu Code Status: Full code Family Communication: Needs to be contacted in the morning Disposition Plan: Home Consults called: Left a message for dialysis, needs to consult neurology Admission status: Full admission   Lilygrace Rodick G Lashae Wollenberg MD [facility]  If 7PM-7AM, please contact night-coverage [email or web address]   07/06/2019, 1:17 AM

## 2019-07-06 NOTE — Progress Notes (Signed)
Chart reviewed. Bedside swallow eval completed but limited secondary to lethargy. Full report to follow.Pt was able to stay awake long enough for 1 bite of applesauce and 3 sips of water via straw with no s/s of aspiration. Rec continue NPO except meds crushed in applesauce when alert enough to take. If more alert tomorrow, can hopefully start a dysphagia 1 diet with thin liquids. ST to further assess through treatment session.

## 2019-07-06 NOTE — Evaluation (Signed)
Clinical/Bedside Swallow Evaluation Patient Details  Name: [name] MRN: [id number] Date of Birth: [birth year]  Today's Date: 07/06/2019 Time: SLP Start Time (ACUTE ONLY): [id number] SLP Stop Time (ACUTE ONLY): 1429 SLP Time Calculation (min) (ACUTE ONLY): 56 min  Past Medical History:  Past Medical History:  Diagnosis Date  . Chronic kidney disease   . Diabetes mellitus without complication ([place])   . ETOH abuse   . Hyperlipidemia   . Hypertension   . Stroke [facility])    Past Surgical History:  Past Surgical History:  Procedure Laterality Date  . COLONOSCOPY WITH PROPOFOL N/A 03/06/2019   Procedure: COLONOSCOPY WITH PROPOFOL;  Surgeon: Lucilla Lame, MD;  Location: [facility] ENDOSCOPY;  Service: Endoscopy;  Laterality: N/A;  . DIALYSIS/PERMA CATHETER INSERTION N/A 04/10/2019   Procedure: DIALYSIS/PERMA CATHETER INSERTION;  Surgeon: Serafina Mitchell, MD;  Location: [place] CV LAB;  Service: Cardiovascular;  Laterality: N/A;  . ESOPHAGOGASTRODUODENOSCOPY (EGD) WITH PROPOFOL N/A 07/01/2016   Procedure: ESOPHAGOGASTRODUODENOSCOPY (EGD) WITH PROPOFOL;  Surgeon: San Jetty, MD;  Location: [facility] ENDOSCOPY;  Service: General;  Laterality: N/A;   HPI: Per admitting History and Physical"    "[name] is a 59 y.o. male with medical history significant of hypertension, end-stage renal disease on dialysis, diabetes mellitus type 2, depression, anemia, alcohol abuse, history of's CVA with right-sided weakness Who came with the emergency room with change in mental status possible seizures like activity Per family was found lying on the floor with seizures like activity, noted loss of bowel control.  No no history of prior seizures" Unknown last dialysis session.  Assessment / Plan / Recommendation Clinical Impression  Bedside swallow eval today was limited secondary to Pt lethargy. Noted Pt had mittens on for safety to prevent him from pulling at lines. Pt awakened long enough for 3 sips of water  via straw and one bite of applesauce. No s/s of aspiration with this limited amount of Po trials. Pt needed stimulation to stay awake and participate. Slow oral transit with the applesauce, but Pt. eventually swallowed without difficulty. Pt responded with a few intelligible single words but most attempts were unintelligible or inappropriate to event. Rec continue NPO except meds crushed when alert enough. Neurology noted current communication deficits related to hyperglycemic event. ST to reassess swallowing through treatment session as Pt becomes more alert and able to safely take Po's. SLP Visit Diagnosis: Dysphagia, oropharyngeal phase (R13.12)    Aspiration Risk  Moderate aspiration risk    Diet Recommendation NPO except meds   Medication Administration: Crushed with puree Postural Changes: Seated upright at 90 degrees;Remain upright for at least 30 minutes after po intake    Other  Recommendations     Follow up Recommendations  ST to reassess for PO readiness through treatment session      Frequency and Duration min 1 x/week  1 week       Prognosis Prognosis for Safe Diet Advancement: Good Barriers to Reach Goals: Behavior;Cognitive deficits      Swallow Study   General Date of Onset: 07/05/19 Type of Study: Bedside Swallow Evaluation Diet Prior to this Study: NPO Temperature Spikes Noted: No Respiratory Status: Room air History of Recent Intubation: No Behavior/Cognition: Confused;Lethargic/Drowsy;Requires cueing;Doesn't follow directions Oral Cavity Assessment: Dry Oral Care Completed by SLP: No Patient Positioning: Upright in bed Baseline Vocal Quality: Breathy;Hoarse;Low vocal intensity    Oral/Motor/Sensory Function     Ice Chips Ice chips: Within functional limits Presentation: Spoon   Thin Liquid Thin Liquid: Within  functional limits Presentation: Straw    Nectar Thick     Honey Thick     Puree Puree: Impaired Presentation: Spoon Oral Phase Impairments:  Poor awareness of bolus;Reduced lingual movement/coordination Oral Phase Functional Implications: Prolonged oral transit   Solid     Solid: Not tested      [name] 07/06/2019,2:30 PM

## 2019-07-06 NOTE — Progress Notes (Addendum)
Inpatient Diabetes Program Recommendations  AACE/ADA: New Consensus Statement on Inpatient Glycemic Control (2015)  Target Ranges:  Prepandial:   less than 140 mg/dL      Peak postprandial:   less than 180 mg/dL (1-2 hours)      Critically ill patients:  140 - 180 mg/dL   Results for BLADIMIR, YAPP (MRN [id number]) as of 07/06/2019 07:28  Ref. Range 07/05/2019 21:35 07/06/2019 00:35 07/06/2019 01:06 07/06/2019 02:47 07/06/2019 03:46 07/06/2019 04:49 07/06/2019 05:52 07/06/2019 06:45  Glucose-Capillary Latest Ref Range: 70 - 99 mg/dL >600 (HH) >600 (HH)  IV Insulin Drip Started >600 (HH)  IV Insulin Drip 424 (H)  IV Insulin Drip 312 (H)  IV Insulin Drip 170 (H)  IV Insulin Drip 94  IV Insulin Drip Stopped 65 (L)   Results for ELDRIDGE, RIELLY (MRN [id number]) as of 07/06/2019 07:28  Ref. Range 07/05/2019 21:56  Glucose Latest Ref Range: 70 - 99 mg/dL 1,[facility])   Results for UEL, FRUIT (MRN [id number]) as of 07/06/2019 07:28  Ref. Range 03/04/2019 21:12 05/28/2019 03:00  Hemoglobin A1C Latest Ref Range: 4.8 - 5.6 % 6.9 (H) 12.3 (H)  (306 mg/dl)    Admit with: AMS/ Seizures/ Hyperosmolar hyperglycemic state/ HTN urgency  History: DM, ESRD, CVA, ETOH Abuse  Home DM Meds: Lantus 10 units Daily   Current Orders: IV Insulin Drip  PCP: Dr. Ginette Pitman with [facility]       Note 2:44am BMET shows the following: Glucose 453 mg/dl Anion Gap 14 CO2 level 23  CBGs down to 94 at 6am and then 65 at 6:45am.  IV Insulin Drip paused at 6:45am due to Hypo event.    MD- Please consider starting the following this AM:  1. Lantus 10 units Daily--Please start this AM (home dose)  2. Novolog Sensitive Correction Scale/ SSI (0-9 units) Q4 hours    Addendum 11am- Went by pt's room to attempt to speak w/ him about his elevated A1c of 12.3%.  Pt confused and speaking incoherently upon my entry into room.  Attempted to call pt's spouse listed on demographics page.  No one answered and unable to  leave voicemail.  Will attempt to call pt's spouse again later.     --Will follow patient during hospitalization--  Wyn Quaker RN, MSN, CDE Diabetes Coordinator Inpatient Glycemic Control Team Team Pager: [phone] (8a-5p)

## 2019-07-06 NOTE — Progress Notes (Signed)
Dressing changed on vas cath.

## 2019-07-06 NOTE — Progress Notes (Signed)
Giving 1 unit per sliding scale, stopping insulin drip and D50. Per MD and recommendation  From DM coordinator.

## 2019-07-06 NOTE — Procedures (Signed)
Patient Name: [name]  MRN: [id number]  Epilepsy Attending: Lora Havens  Referring Physician/Provider: Dr Binnie Kand Date: 07/06/2019 Duration: 23.28 minutes  Patient history: 59 year old male with past medical history of CVA with residual right-sided weakness, alcohol abuse, diabetes, CKD on dialysis who presented to the emergency department with altered mental status and seizure in the setting of hyperosmolar hyperglycemic state.  EEG to evaluate for seizures.  Level of alertness: Lethargic  AEDs during EEG study: Keppra  Technical aspects: This EEG study was done with scalp electrodes positioned according to the 10-20 International system of electrode placement. Electrical activity was acquired at a sampling rate of 500Hz  and reviewed with a high frequency filter of 70Hz  and a low frequency filter of 1Hz . EEG data were recorded continuously and digitally stored.   Description: No clear posterior dominant rhythm was seen.  EEG showed continuous generalized 2 to 3 Hz delta slowing.  Of note, study was technically difficult due to significant myogenic and electrode artifact. Hyperventilation and photic stimulation were not performed.  Abnormality Continued slow, generalized  IMPRESSION: This technically difficult study is suggestive of moderate to severe diffuse encephalopathy, nonspecific to etiology.  No seizures or epileptiform discharges were seen throughout the recording.  If concern for interictal activity remains, can consider repeat study with sleep.  [name] [name]

## 2019-07-06 NOTE — Progress Notes (Signed)
eeg completed ° °

## 2019-07-07 DIAGNOSIS — E889 Metabolic disorder, unspecified: Secondary | ICD-10-CM

## 2019-07-07 LAB — COMPREHENSIVE METABOLIC PANEL
ALT: 17 U/L (ref 0–44)
AST: 27 U/L (ref 15–41)
Albumin: 2.2 g/dL — ABNORMAL LOW (ref 3.5–5.0)
Alkaline Phosphatase: 200 U/L — ABNORMAL HIGH (ref 38–126)
Anion gap: 10 (ref 5–15)
BUN: 19 mg/dL (ref 6–20)
CO2: 26 mmol/L (ref 22–32)
Calcium: 8.2 mg/dL — ABNORMAL LOW (ref 8.9–10.3)
Chloride: 100 mmol/L (ref 98–111)
Creatinine, Ser: 3.99 mg/dL — ABNORMAL HIGH (ref 0.61–1.24)
GFR calc Af Amer: 18 mL/min — ABNORMAL LOW (ref >60)
GFR calc non Af Amer: 15 mL/min — ABNORMAL LOW (ref >60)
Glucose, Bld: 112 mg/dL — ABNORMAL HIGH (ref 70–99)
Potassium: 3 mmol/L — ABNORMAL LOW (ref 3.5–5.1)
Sodium: 136 mmol/L (ref 135–145)
Total Bilirubin: 0.8 mg/dL (ref 0.3–1.2)
Total Protein: 5.5 g/dL — ABNORMAL LOW (ref 6.5–8.1)

## 2019-07-07 LAB — CBC
HCT: 32.4 % — ABNORMAL LOW (ref 39.0–52.0)
HCT: 29 % — ABNORMAL LOW (ref 39.0–52.0)
Hemoglobin: 10.6 g/dL — ABNORMAL LOW (ref 13.0–17.0)
Hemoglobin: 9.8 g/dL — ABNORMAL LOW (ref 13.0–17.0)
MCH: 28.6 pg (ref 26.0–34.0)
MCH: 29.3 pg (ref 26.0–34.0)
MCHC: 32.7 g/dL (ref 30.0–36.0)
MCHC: 33.8 g/dL (ref 30.0–36.0)
MCV: 87.3 fL (ref 80.0–100.0)
MCV: 86.6 fL (ref 80.0–100.0)
Platelets: 217 10*3/uL (ref 150–400)
Platelets: 175 10*3/uL (ref 150–400)
RBC: 3.71 MIL/uL — ABNORMAL LOW (ref 4.22–5.81)
RBC: 3.35 MIL/uL — ABNORMAL LOW (ref 4.22–5.81)
RDW: 14.7 % (ref 11.5–15.5)
RDW: 14.8 % (ref 11.5–15.5)
WBC: 8.7 10*3/uL (ref 4.0–10.5)
WBC: 8.4 10*3/uL (ref 4.0–10.5)
nRBC: 0 % (ref 0.0–0.2)
nRBC: 0 % (ref 0.0–0.2)

## 2019-07-07 LAB — GLUCOSE, CAPILLARY
Glucose-Capillary: 114 mg/dL — ABNORMAL HIGH (ref 70–99)
Glucose-Capillary: 137 mg/dL — ABNORMAL HIGH (ref 70–99)
Glucose-Capillary: 204 mg/dL — ABNORMAL HIGH (ref 70–99)
Glucose-Capillary: 260 mg/dL — ABNORMAL HIGH (ref 70–99)
Glucose-Capillary: 79 mg/dL (ref 70–99)

## 2019-07-07 LAB — PHOSPHORUS: Phosphorus: 3.3 mg/dL (ref 2.5–4.6)

## 2019-07-07 LAB — MAGNESIUM: Magnesium: 1.7 mg/dL (ref 1.7–2.4)

## 2019-07-07 MED ORDER — POTASSIUM CHLORIDE CRYS ER 20 MEQ PO TBCR
40.0000 meq | EXTENDED_RELEASE_TABLET | ORAL | Status: AC
  Administered 2019-07-07: 40 meq via ORAL
  Filled 2019-07-07: qty 2

## 2019-07-07 MED ORDER — THIAMINE HCL 100 MG/ML IJ SOLN
100.0000 mg | Freq: Every day | INTRAMUSCULAR | Status: DC
  Administered 2019-07-08: 100 mg via INTRAVENOUS
  Filled 2019-07-07: qty 2

## 2019-07-07 MED ORDER — MAGNESIUM SULFATE 2 GM/50ML IV SOLN
2.0000 g | Freq: Once | INTRAVENOUS | Status: AC
  Administered 2019-07-07: 2 g via INTRAVENOUS
  Filled 2019-07-07: qty 50

## 2019-07-07 MED ORDER — POTASSIUM CHLORIDE CRYS ER 20 MEQ PO TBCR
40.0000 meq | EXTENDED_RELEASE_TABLET | Freq: Once | ORAL | Status: AC
  Administered 2019-07-07: 06:00:00 40 meq via ORAL
  Filled 2019-07-07: qty 2

## 2019-07-07 NOTE — TOC Initial Note (Signed)
Transition of Care [facility]) - Initial/Assessment Note    Patient Details  Name: [name] MRN: [id number] Date of Birth: 1960-06-06  Transition of Care [facility]) CM/SW Contact:    [name], [name] Phone Number: 07/07/2019, 1:23 PM  Clinical Narrative:        Patient not oriented. CSW called patient's wife, [name]. Explained CSW role. [name] reported patient lives at home with her. [name] said she works 12 hour shifts, so patient's sister provides assistance with transportation to appointments. [name] reported patient uses [facility] representative in [place] said at times they don't know if medicines will be covered by insurance or not, but that overall patient has no issues obtaining medicines. PCP is [name]. [name] said patient has a walker at home and denied equipment needs at this time. Patient is currently active with [facility] and they would like to continue these services upon discharge (IHA, PT, OT, and RN). CSW encouraged [name] to reach out with any needs. CSW will continue to follow.   Expected Discharge Plan: [place] Barriers to Discharge: Continued Medical Work up   Patient Goals and CMS Choice   CMS Medicare.gov Compare Post Acute Care list provided to:: (Wife would like patient to continue with services through [facility].)    Expected Discharge Plan and Services Expected Discharge Plan: [place], [place]     Post Acute Care Choice: Resumption of Svcs/PTA Provider                                        Prior Living Arrangements/Services   Lives with:: Spouse Patient language and need for interpreter reviewed:: Yes Do you feel safe going back to the place where you live?: Yes      Need for Family Participation in Patient Care: Yes (Comment) Care giver support system in place?: Yes (comment)(Wife and sister are involved.) Current home services: DME, Home OT, Home PT, Home RN,  Homehealth aide Criminal Activity/Legal Involvement Pertinent to Current Situation/Hospitalization: No - Comment as needed  Activities of Daily Living      Permission Sought/Granted                  Emotional Assessment         Alcohol / Substance Use: Not Applicable Psych Involvement: No (comment)  Admission diagnosis:  Acute encephalopathy [G93.40] Hyperosmolar hyperglycemic state (HHS) ([place]) [E11.00, E11.65] Patient Active Problem List   Diagnosis Date Noted  . Acute metabolic encephalopathy A999333  . Hypertensive emergency 07/06/2019  . Acute encephalopathy 07/06/2019  . Seizures due to metabolic disorder ([place]) [id number]  . Hyperosmolar hyperglycemic state (HHS) ([place]) 06/05/2019  . Anemia 06/04/2019  . Frequent falls 06/04/2019  . History of CVA (cerebrovascular accident) 06/04/2019  . HCAP (healthcare-associated pneumonia) 06/04/2019  . Acute blood loss anemia 06/04/2019  . Hyperglycemia 05/28/2019  . Fall at home, initial encounter 05/28/2019  . Rib fracture 05/28/2019  . Depression 05/28/2019  . Hypokalemia 05/28/2019  . Weakness 05/27/2019  . ESRD on hemodialysis ([place])   . Acute renal failure (ARF) ([place]) 04/09/2019  . Polyp of ascending colon   . Diarrhea   . AKI (acute kidney injury) ([place]) 03/04/2019  . Acute kidney injury ([place]) 07/26/2018  . ARF (acute renal failure) ([place]) 07/25/2018  . Bilateral leg numbness 03/19/2018  . Numbness and tingling of both feet 03/19/2018  . CVA (cerebral  vascular accident) ([place]) 03/17/2018  . Moderate episode of recurrent major depressive disorder ([place]) 03/11/2018  . UTI (urinary tract infection) 02/27/2018  . Hypoglycemia 01/15/2018  . Type 2 diabetes mellitus without complication, with long-term current use of insulin ([place]) 08/27/2017  . Protein-calorie malnutrition, severe 08/19/2017  . Pancreatitis, acute 08/16/2017  . DKA (diabetic ketoacidoses) ([place]) 08/16/2017  . HTN (hypertension) 08/16/2017  . HLD  (hyperlipidemia) 08/16/2017  . Carotid stenosis 08/02/2016  . GERD (gastroesophageal reflux disease) 08/02/2016  . History of esophagogastroduodenoscopy (EGD) 07/01/2016  . History of recent blood transfusion 07/01/2016  . Acute renal failure superimposed on stage 4 chronic kidney disease ([place]) 07/01/2016  . Hyponatremia 07/01/2016  . Uncontrolled diabetes mellitus ([place]) 07/01/2016  . Alcohol abuse 07/01/2016  . Monilial esophagitis ([place]) 07/01/2016  . Tobacco abuse 07/01/2016  . Confusion 06/29/2016  . Iron deficiency anemia 06/29/2016  . Coagulopathy ([place]) 06/29/2016   PCP:  Reginald Harrier, MD Pharmacy:   [facility] [phone] - [address], [address] AT [address] [place] [address] Phone: [phone] Fax: [phone]     Social Determinants of Health (SDOH) Interventions    Readmission Risk Interventions Readmission Risk Prevention Plan 07/07/2019 06/09/2019 06/05/2019  Transportation Screening Complete - Complete  PCP or Specialist Appt within 3-5 Days Complete Complete Complete  HRI or Home Care Consult Complete Complete Complete  Palliative Care Screening - - Not Applicable  Medication Review (RN Care Manager) Complete - Complete  Some recent data might be hidden

## 2019-07-07 NOTE — Progress Notes (Signed)
Reason for consult: Altered mental status, hypertensive encephalopathy  Subjective: Patient is awake and alert.  Comfortably sitting in bed and having breakfast.  Visual fields are intact   ROS: negative except above  Examination  Vital signs in last 24 hours: Temp:  [98.2 F (36.8 C)-98.7 F (37.1 C)] 98.7 F (37.1 C) (02/23 0930) Pulse Rate:  [65-89] 86 (02/23 1130) Resp:  [8-24] 20 (02/23 1045) BP: (120-185)/(80-107) 139/85 (02/23 1130) SpO2:  [94 %-100 %] 99 % (02/23 1130) Weight:  [62.6 kg] 62.6 kg (02/23 0930)  General: lying in bed CVS: pulse-normal rate and rhythm RS: breathing comfortably Extremities: normal   Neuro: MS: Alert, oriented, follows commands CN: pupils equal and reactive,  EOMI, face symmetric, tongue midline, normal sensation over face, Motor: moves all 4 extremities antigravity  Reflexes: 2+ bilaterally over patella, biceps, plantars: flexor Coordination: normal Gait: not tested  Basic Metabolic Panel: Recent Labs  Lab 07/05/19 2156 07/06/19 0244 07/07/19 0441  NA 128* 135 136  K 3.5 2.9* 3.0*  CL 90* 98 100  CO2 27 23 26   GLUCOSE 1,059* 453* 112*  BUN 17 19 19   CREATININE 3.37* 3.44* 3.99*  CALCIUM 8.1* 8.5* 8.2*  MG  --  1.7 1.7  PHOS  --  3.4 3.3    CBC: Recent Labs  Lab 07/05/19 2156 07/06/19 0244 07/07/19 0441 07/07/19 1100  WBC 10.4 12.5* 8.7 8.4  NEUTROABS 8.9*  --   --   --   HGB 11.3* 9.8* 10.6* 9.8*  HCT 34.9* 28.7* 32.4* 29.0*  MCV 89.5 86.2 87.3 86.6  PLT 206 210 217 175     Coagulation Studies: No results for input(s): LABPROT, INR in the last 72 hours.  Imaging I have reviewed the above imaging : CT Head unremarkable  MRI Brain: new area of edema in the R [place]-occipital lobe concerning for PRES, less likely Seizure however    EEG on 2/22 IMPRESSION: This technically difficult study is suggestive of moderate to severe diffuse encephalopathy, nonspecific to etiology.  No seizures or epileptiform  discharges were seen throughout the recording.  If concern for interictal activity remains, can consider repeat study with sleep.   ASSESSMENT AND PLAN  59 y.o. male with past medical history significant for prior CVA with residual right-sided weakness, hypertension, hyperlipidemia, alcohol abuse, diabetes mellitus, CKD on dialysis presented to the emergency department yesterday with altered mental status and seizure in the setting of hyper osmolar hyperglycemic state as well elevated HTN.  BP intially greater than A999333 systolic, has improved with medications. MRI brain shows some edema without infarct in right parietal occipital lobe concerning for PRES.  Acute metabolic encephalopathy in the setting of hyperosmolar hyperglycemic state and hypertension Hypertensive encephalopathy with  Posterior Reversible Encephalopathy Syndrome  Seizures- provoked   Recommendations BP goal less 140/90 mmHg, improving Continue to treat electrolyte and metabolic conditions EEG showed no epileptiform discharges Continue Keppra 500mg   BID x 2 weeks and then discontinue, seizure likely provoked in the setting of PRES/HTN emergency and HHS Seizure precautions including no driving x 87months F/U B1 level, if normal d/c Thiamine, will reduce dose to 100mg  daily  PT OT evaluation  Per [facility] statutes, patients with seizures are not allowed to drive until they have been seizure-free for six months. Use caution when using heavy equipment or power tools. Avoid working on ladders or at heights. Take showers instead of baths. Ensure the water temperature is not too high on the home water heater. Do  not go swimming alone. Do not lock yourself in a room alone (i.e. bathroom). When caring for infants or small children, sit down when holding, feeding, or changing them to minimize risk of injury to the child in the event you have a seizure. Maintain good sleep hygiene. Avoid alcohol.    If GIOACCHINO LAESSIG has  another seizure, call 911 and bring them back to the ED if:       A.  The seizure lasts longer than 5 minutes.            B.  The patient doesn't wake shortly after the seizure or has new problems such as difficulty seeing, speaking or moving following the seizure       C.  The patient was injured during the seizure       D.  The patient has a temperature over 102 F (39C)       E.  The patient vomited during the seizure and now is having trouble breathing    Karena Addison Sadye Kiernan [facility] Pager Number [phone] For questions after 7pm please refer to AMION to reach the Neurologist on call

## 2019-07-07 NOTE — Progress Notes (Signed)
[facility]  ROUNDING NOTE   Subjective:   Seen and examined on hemodialysis treatment.     HEMODIALYSIS FLOWSHEET:  Blood Flow Rate (mL/min): 400 mL/min Arterial Pressure (mmHg): -170 mmHg Venous Pressure (mmHg): 150 mmHg Transmembrane Pressure (mmHg): 70 mmHg Ultrafiltration Rate (mL/min): 670 mL/min Dialysate Flow Rate (mL/min): 600 ml/min Conductivity: Machine : 13.9 Conductivity: Machine : 13.9 Dialysis Fluid Bolus: Normal Saline Bolus Amount (mL): 250 mL    Objective:  Vital signs in last 24 hours:  Temp:  [98.2 F (36.8 C)-98.7 F (37.1 C)] 98.7 F (37.1 C) (02/23 0930) Pulse Rate:  [65-89] 86 (02/23 1130) Resp:  [8-24] 20 (02/23 1045) BP: (120-182)/(80-107) 139/85 (02/23 1130) SpO2:  [94 %-100 %] 99 % (02/23 1130) Weight:  [62.6 kg] 62.6 kg (02/23 0930)  Weight change:  Filed Weights   07/05/19 2131 07/06/19 0138 07/07/19 0930  Weight: 64.4 kg 62.6 kg 62.6 kg    Intake/Output: I/O last 3 completed shifts: In: 438.3 [I.V.:338.3; IV Piggyback:100] Out: 450 [Urine:450]   Intake/Output this shift:  No intake/output data recorded.  Physical Exam: General: NAD, laying in bed  Head: Normocephalic, atraumatic. Moist oral mucosal membranes  Eyes: Anicteric, PERRL  Neck: Supple, trachea midline  Lungs:  Clear to auscultation  Heart: Regular rate and rhythm  Abdomen:  Soft, nontender,   Extremities: no peripheral edema.  Neurologic: Oriented to self and place  Skin: No lesions  Access: RIJ permcath    Basic Metabolic Panel: Recent Labs  Lab 07/05/19 2156 07/06/19 0244 07/07/19 0441  NA 128* 135 136  K 3.5 2.9* 3.0*  CL 90* 98 100  CO2 27 23 26   GLUCOSE 1,059* 453* 112*  BUN 17 19 19   CREATININE 3.37* 3.44* 3.99*  CALCIUM 8.1* 8.5* 8.2*  MG  --  1.7 1.7  PHOS  --  3.4 3.3    Liver Function Tests: Recent Labs  Lab 07/05/19 2156 07/06/19 0244 07/07/19 0441  AST 27 30 27   ALT 20 18 17   ALKPHOS 267* 241* 200*  BILITOT 0.7  0.5 0.8  PROT 6.6 6.4* 5.5*  ALBUMIN 2.7* 2.6* 2.2*   No results for input(s): LIPASE, AMYLASE in the last 168 hours. No results for input(s): AMMONIA in the last 168 hours.  CBC: Recent Labs  Lab 07/05/19 2156 07/06/19 0244 07/07/19 0441 07/07/19 1100  WBC 10.4 12.5* 8.7 8.4  NEUTROABS 8.9*  --   --   --   HGB 11.3* 9.8* 10.6* 9.8*  HCT 34.9* 28.7* 32.4* 29.0*  MCV 89.5 86.2 87.3 86.6  PLT 206 210 217 175    Cardiac Enzymes: No results for input(s): CKTOTAL, CKMB, CKMBINDEX, TROPONINI in the last 168 hours.  BNP: Invalid input(s): POCBNP  CBG: Recent Labs  Lab 07/06/19 1218 07/06/19 1404 07/06/19 1822 07/07/19 0044 07/07/19 0523  GLUCAP 138* 170* 121* 79 114*    Microbiology: Results for orders placed or performed during the hospital encounter of 07/05/19  Respiratory Panel by RT PCR (Flu A&B, Covid) - Nasopharyngeal Swab     Status: None   Collection Time: 07/05/19 11:14 PM   Specimen: Nasopharyngeal Swab  Result Value Ref Range Status   SARS Coronavirus 2 by RT PCR NEGATIVE NEGATIVE Final    Comment: (NOTE) SARS-CoV-2 target nucleic acids are NOT DETECTED. The SARS-CoV-2 RNA is generally detectable in upper respiratoy specimens during the acute phase of infection. The lowest concentration of SARS-CoV-2 viral copies this assay can detect is 131 copies/mL. A negative result does not  preclude SARS-Cov-2 infection and should not be used as the sole basis for treatment or other patient management decisions. A negative result may occur with  improper specimen collection/handling, submission of specimen other than nasopharyngeal swab, presence of viral mutation(s) within the areas targeted by this assay, and inadequate number of viral copies (<131 copies/mL). A negative result must be combined with clinical observations, patient history, and epidemiological information. The expected result is Negative. Fact Sheet for Patients:   PinkCheek.be Fact Sheet for Healthcare Providers:  GravelBags.it This test is not yet ap proved or cleared by the [place] FDA and  has been authorized for detection and/or diagnosis of SARS-CoV-2 by FDA under an Emergency Use Authorization (EUA). This EUA will remain  in effect (meaning this test can be used) for the duration of the COVID-19 declaration under Section 564(b)(1) of the Act, 21 U.S.C. section 360bbb-3(b)(1), unless the authorization is terminated or revoked sooner.    Influenza A by PCR NEGATIVE NEGATIVE Final   Influenza B by PCR NEGATIVE NEGATIVE Final    Comment: (NOTE) The Xpert Xpress SARS-CoV-2/FLU/RSV assay is intended as an aid in  the diagnosis of influenza from Nasopharyngeal swab specimens and  should not be used as a sole basis for treatment. Nasal washings and  aspirates are unacceptable for Xpert Xpress SARS-CoV-2/FLU/RSV  testing. Fact Sheet for Patients: PinkCheek.be Fact Sheet for Healthcare Providers: GravelBags.it This test is not yet approved or cleared by the [place] FDA and  has been authorized for detection and/or diagnosis of SARS-CoV-2 by  FDA under an Emergency Use Authorization (EUA). This EUA will remain  in effect (meaning this test can be used) for the duration of the  Covid-19 declaration under Section 564(b)(1) of the Act, 21  U.S.C. section 360bbb-3(b)(1), unless the authorization is  terminated or revoked. Performed at [facility], [address] [place], [place] [address]   MRSA PCR Screening     Status: None   Collection Time: 07/06/19  1:44 AM   Specimen: Nasal Mucosa; Nasopharyngeal  Result Value Ref Range Status   MRSA by PCR NEGATIVE NEGATIVE Final    Comment:        The GeneXpert MRSA Assay (FDA approved for NASAL specimens only), is one component of a comprehensive MRSA  colonization surveillance program. It is not intended to diagnose MRSA infection nor to guide or monitor treatment for MRSA infections. Performed at [facility], [address] [place], [place] [address]     Coagulation Studies: No results for input(s): LABPROT, INR in the last 72 hours.  Urinalysis: Recent Labs    07/06/19 0144  COLORURINE YELLOW*  LABSPEC 1.017  PHURINE 7.0  GLUCOSEU >=500*  HGBUR SMALL*  BILIRUBINUR NEGATIVE  KETONESUR NEGATIVE  PROTEINUR >=300*  NITRITE NEGATIVE  LEUKOCYTESUR NEGATIVE      Imaging: EEG  Result Date: 07/06/2019 Lora Havens, MD     07/06/2019  3:49 PM Patient Name: [name] MRN: [id number] Epilepsy Attending: Lora Havens Referring Physician/Provider: Dr Binnie Kand Date: 07/06/2019 Duration: 23.28 minutes Patient history: 59 year old male with past medical history of CVA with residual right-sided weakness, alcohol abuse, diabetes, CKD on dialysis who presented to the emergency department with altered mental status and seizure in the setting of hyperosmolar hyperglycemic state.  EEG to evaluate for seizures. Level of alertness: Lethargic AEDs during EEG study: Keppra Technical aspects: This EEG study was done with scalp electrodes positioned according to the 10-20 International system of electrode placement. Dealer  activity was acquired at a sampling rate of 500Hz  and reviewed with a high frequency filter of 70Hz  and a low frequency filter of 1Hz . EEG data were recorded continuously and digitally stored. Description: No clear posterior dominant rhythm was seen.  EEG showed continuous generalized 2 to 3 Hz delta slowing.  Of note, study was technically difficult due to significant myogenic and electrode artifact. Hyperventilation and photic stimulation were not performed. Abnormality Continued slow, generalized IMPRESSION: This technically difficult study is suggestive of moderate to severe diffuse encephalopathy,  nonspecific to etiology.  No seizures or epileptiform discharges were seen throughout the recording. If concern for interictal activity remains, can consider repeat study with sleep. Lora Havens   CT Head Wo Contrast  Result Date: 07/05/2019 CLINICAL DATA:  Altered mental status EXAM: CT HEAD WITHOUT CONTRAST TECHNIQUE: Contiguous axial images were obtained from the base of the skull through the vertex without intravenous contrast. COMPARISON:  CT head 06/04/2019 FINDINGS: Brain: Stable regions of encephalomalacia in the bilateral frontal lobes, insula and right caudate likely as a result of prior ischemic insult. Additional areas of remote lacunar infarct again seen in the basal ganglia. No convincing areas of acute CT evident infarction. Evaluation of the inferior temporal lobes and cerebellum are limited due to motion artifact. Patchy areas of white matter hypoattenuation are most compatible with chronic microvascular angiopathy. Symmetric prominence of the ventricles, cisterns and sulci compatible with parenchymal volume loss. Vascular: Atherosclerotic calcification of the carotid siphons and intradural vertebral arteries. No hyperdense vessel though evaluation of the skull base is limited due to motion. Skull: Motion artifact may limit detection of subtle osseous injuries. Sinuses/Orbits: Persistent chronic opacification of the left frontal sinus. No air-fluid levels. Remaining paranasal sinuses and mastoid air cells are grossly clear. Orbital structures are difficult to evaluate in the setting of motion. Other: None IMPRESSION: Imaging quality is significantly degraded by patient motion artifact particularly towards the inferior temporal lobes, skull base and posterior fossa. No definite acute intracranial abnormality. Stable regions of encephalomalacia in the frontal lobes, insula, caudate and basal ganglia. Chronic microvascular angiopathy changes and parenchymal volume loss, similar to prior.  Electronically Signed   By: Lovena Le M.D.   On: 07/05/2019 22:58   MR BRAIN WO CONTRAST  Result Date: 07/06/2019 CLINICAL DATA:  Seizure, history of stroke EXAM: MRI HEAD WITHOUT CONTRAST TECHNIQUE: Multiplanar, multiecho pulse sequences of the brain and surrounding structures were obtained without intravenous contrast. COMPARISON:  2017 FINDINGS: Motion artifact is present. Brain: There is no acute infarction or hemorrhage. Encephalomalacia is present in the left frontal lobe and insula including involvement of the precentral gyrus with associated volume loss. Smaller chronic infarcts are present in the right frontal lobe. There is a chronic small vessel infarct of the right caudate. New T2 hyperintensity in the right parieto-occipital region without diffusion abnormality appears to be primarily subcortical. Additional patchy T2 hyperintensity in the supratentorial white matter is nonspecific but probably reflects stable chronic microvascular ischemic changes. Susceptibility weighted imaging is degraded by motion. No evidence of hemorrhage. There is no intracranial mass or mass effect. Prominence of the ventricles and sulci reflects similar generalized parenchymal volume loss. Vascular: As before, there is loss of the normal flow voids of the visualized internal carotid arteries. Skull and upper cervical spine: Normal marrow signal is preserved. Sinuses/Orbits: Chronic left frontal sinus opacification. Orbits are unremarkable. Other: Mastoid air cells are clear.  Sella is unremarkable. IMPRESSION: Suboptimal evaluation due to motion artifact. No acute infarction or  hemorrhage. Multiple chronic infarcts involving bilateral cerebral hemispheres. New subcortical T2 hyperintensity in the right parietooccipital region without definite cortical involvement. This could reflect atypical unilateral PRES. Seizure related edema secondary to occipital seizure in the setting of hyperosmolar hyperglycemic state is a  possibility but imaging appearance is not convincing for this. Electronically Signed   By: Macy Mis M.D.   On: 07/06/2019 12:33   DG Hand 2 View Right  Result Date: 07/05/2019 CLINICAL DATA:  Found down, right hand swelling and weakness EXAM: RIGHT HAND - 2 VIEW COMPARISON:  None. FINDINGS: Frontal and lateral views of the right hand are obtained. There are no acute displaced fractures alignment is grossly anatomic. Joint spaces are well preserved. Mild diffuse soft tissue edema is noted. IMPRESSION: 1. Soft tissue edema.  No acute bony abnormality Electronically Signed   By: Randa Ngo M.D.   On: 07/05/2019 22:37   DG Chest Port 1 View  Result Date: 07/05/2019 CLINICAL DATA:  Altered level of consciousness, found down, end-stage renal disease EXAM: PORTABLE CHEST 1 VIEW COMPARISON:  06/05/2019 FINDINGS: Two frontal views of the chest demonstrate right-sided dialysis catheter unchanged. Cardiac silhouette is stable. There is chronic central vascular congestion, with increased interstitial prominence likely representing fluid overload. No effusion or pneumothorax. No acute bony abnormalities. IMPRESSION: 1. Vascular congestion and increased interstitial prominence consistent with fluid overload. Electronically Signed   By: Randa Ngo M.D.   On: 07/05/2019 22:38     Medications:   . sodium chloride    . levETIRAcetam 500 mg (07/07/19 0404)   . amLODipine  10 mg Oral Daily  . Chlorhexidine Gluconate Cloth  6 each Topical Daily  . heparin  5,000 Units Subcutaneous Q8H  . insulin aspart  0-9 Units Subcutaneous TID WC  . metoprolol tartrate  50 mg Oral BID  . potassium chloride  40 mEq Oral Q4H  . sodium chloride flush  3 mL Intravenous Q12H  . sodium chloride flush  3 mL Intravenous Q12H  . [START ON 07/08/2019] thiamine injection  100 mg Intravenous Daily   sodium chloride, dextrose, hydrALAZINE, labetalol, LORazepam, ondansetron **OR** ondansetron (ZOFRAN) IV, sodium chloride  flush  Assessment/ Plan:  [name] is a 59 y.o. black male with end stage renal disease on hemodialysis, diabetes mellitus type II, hypertension, CVA, hyperlipidemia, EtOH abuse who is admitted to [facility] on 07/05/2019 for Acute encephalopathy [G93.40] Hyperosmolar hyperglycemic state (HHS) ([place]) [E11.00, E11.65]  [facility] TTS [address] RIJ permcath  1. End Stage Renal Disease:  Seen and examined on hemodialysis treatment.   2. Hypertension: Hypertensive urgency on admission. Home regimen of amlodipine and metoprolol.  Has not well controlled blood pressures as an outpatient.  - IV labetalol PRN  3. Anemia of chronic kidney disease:   - EPO with HD treatment  4. Secondary Hyperparathyroidism: outpatient labs on 06/23/19: PTH 353, phos 4.8, calcium 8.1. Not currently on binders. Recently started on cinacalcet.   5. Hypokalemia - PO replacement - 3K bath   LOS: 1 Marcin Holte 2/23/202112:18 PM

## 2019-07-07 NOTE — Progress Notes (Signed)
PROGRESS NOTE    [name]  [id number] DOB: [birth year] DOA: 07/05/2019  PCP: [name], MD    LOS - 1   Brief Narrative:  59 year old male with past medical history of CVA's with residual right-sided weakness, alcohol abuse, type 2 diabetes, alcohol abuse, ESRD on dialysis who presented to the emergency department with altered mental status and seizure in the setting of hyperosmolar hyperglycemic state and hypertensive emergency.  In the ED, BP was 200/134 (max reading 246/105), blood glucose >1000 without elevated anion gap.  Head CT negative for acute findings.  Patient placed on DKA/HHS protocol with [facility].  Admitted to hospitalist service with neurology and nephrology consulted.      Subjective 2/23: Patient seen in stepdown after dialysis today.  He reports feeling well and denies any symptoms.  States he feels just fine.  Denies fevers or chills, chest pain, shortness of breath, nausea vomiting or diarrhea.  Patient was disappointed to learn he is not being discharged today.  Advised patient of need for titrating blood pressure medications for adequate control to prevent recurrence of the issue that brought him to the hospital.  He does not verbalize agreement and continues to state he wants to go home.  Assessment & Plan:   Principal Problem:   Hyperosmolar hyperglycemic state (HHS) ([place]) Active Problems:   Iron deficiency anemia   Uncontrolled diabetes mellitus (HCC)   Alcohol abuse   GERD (gastroesophageal reflux disease)   CVA (cerebral vascular accident) ([place])   ESRD on hemodialysis ([place])   History of CVA (cerebrovascular accident)   Moderate episode of recurrent major depressive disorder ([place])   Acute metabolic encephalopathy   Hypertensive emergency   Acute encephalopathy   Seizures due to metabolic disorder ([place])   Acute metabolic encephalopathy -present on admission, resolved.  Suspect this was hypertensive encephalopathy with PRES versus postictal  state in the setting of seizure.  Loaded on Keppra in the ED.  MRI brain showed multiple chronic infarcts and a new T2 hyperintensity of the right parieto-occipital area consistent with an atypical presentation of PRES.  Hypertensive emergency -resolved .  Present on admission as evidenced by severely uncontrolled blood pressure, acute encephalopathy and MRI brain with findings consistent with atypical PRES. Patient has history of poorly controlled hypertension as outpatient. --Continue home amlodipine and metoprolol --IV labetalol and/or hydralazine as needed   Seizures -provoked in the setting of hypertensive emergency and HHS --Neurology following --Continue Keppra 500 mg p.o. twice daily x2 weeks, then discontinue since this was provoked --Seizure precautions --Ativan as needed seizure activity --No driving for 6 months --Precautions per neurology: "Per [facility] statutes, patients with seizures are not allowed to drive until they have been seizure-free for six months. Use caution when using heavy equipment or power tools. Avoid working on ladders or at heights. Take showers instead of baths. Ensure the water temperature is not too high on the home water heater. Do not go swimming alone. Do not lock yourself in a room alone (i.e. bathroom). When caring for infants or small children, sit down when holding, feeding, or changing them to minimize risk of injury to the child in the event you have a seizure. Maintain good sleep hygiene. Avoid alcohol."  Hyperosmolar hyperglycemic state -present on admission, resolved.    Alcohol use disorder -continue thiamine --Strongly advised cessation of alcohol   DVT prophylaxis: Heparin   Code Status: Full Code  Family Communication: None at bedside during encounter  Disposition Plan: Anticipate discharge tomorrow  to prior home environment, pending therapy evaluations and clearance by consultants. Coming From home Exp DC Date 2/24 Barriers  monitor blood pressure and blood glucose overnight Medically Stable for Discharge?  No  Consultants:   Neurology  Nephrology  Procedures:   EEG 2/22  Antimicrobials:   None   Objective: Vitals:   07/07/19 1215 07/07/19 1230 07/07/19 1245 07/07/19 1252  BP: (!) 127/92 137/79 128/87 131/84  Pulse: 83 86 82 80  Resp:    18  Temp:    98.4 F (36.9 C)  TempSrc:    Oral  SpO2: 99% 99% 98% 100%  Weight:      Height:        Intake/Output Summary (Last 24 hours) at 07/07/2019 1505 Last data filed at 07/07/2019 1252 Gross per 24 hour  Intake --  Output 1865 ml  Net -1865 ml   Filed Weights   07/05/19 2131 07/06/19 0138 07/07/19 0930  Weight: 64.4 kg 62.6 kg 62.6 kg    Examination:  General exam: awake, alert, no acute distress HEENT: moist mucus membranes, hearing grossly normal  Respiratory system: CTAB, no wheezes, rales or rhonchi, normal respiratory effort. Cardiovascular system: normal S1/S2, RRR, no pedal edema.   Central nervous system: A&O x3. no gross focal neurologic deficits, normal speech Extremities: moves all, no edema, normal tone Skin: dry, intact, normal temperature, normal color Psychiatry: normal mood, congruent affect, abnormal judgement and insight     Data Reviewed: I have personally reviewed following labs and imaging studies  CBC: Recent Labs  Lab 07/05/19 2156 07/06/19 0244 07/07/19 0441 07/07/19 1100  WBC 10.4 12.5* 8.7 8.4  NEUTROABS 8.9*  --   --   --   HGB 11.3* 9.8* 10.6* 9.8*  HCT 34.9* 28.7* 32.4* 29.0*  MCV 89.5 86.2 87.3 86.6  PLT 206 210 217 0000000   Basic Metabolic Panel: Recent Labs  Lab 07/05/19 2156 07/06/19 0244 07/07/19 0441  NA 128* 135 136  K 3.5 2.9* 3.0*  CL 90* 98 100  CO2 27 23 26   GLUCOSE 1,059* 453* 112*  BUN 17 19 19   CREATININE 3.37* 3.44* 3.99*  CALCIUM 8.1* 8.5* 8.2*  MG  --  1.7 1.7  PHOS  --  3.4 3.3   GFR: Estimated Creatinine Clearance: 17.7 mL/min (A) (by C-G formula based on SCr of  3.99 mg/dL (H)). Liver Function Tests: Recent Labs  Lab 07/05/19 2156 07/06/19 0244 07/07/19 0441  AST 27 30 27   ALT 20 18 17   ALKPHOS 267* 241* 200*  BILITOT 0.7 0.5 0.8  PROT 6.6 6.4* 5.5*  ALBUMIN 2.7* 2.6* 2.2*   No results for input(s): LIPASE, AMYLASE in the last 168 hours. No results for input(s): AMMONIA in the last 168 hours. Coagulation Profile: No results for input(s): INR, PROTIME in the last 168 hours. Cardiac Enzymes: No results for input(s): CKTOTAL, CKMB, CKMBINDEX, TROPONINI in the last 168 hours. BNP (last 3 results) No results for input(s): PROBNP in the last 8760 hours. HbA1C: No results for input(s): HGBA1C in the last 72 hours. CBG: Recent Labs  Lab 07/06/19 1404 07/06/19 1822 07/07/19 0044 07/07/19 0523 07/07/19 1319  GLUCAP 170* 121* 79 114* 137*   Lipid Profile: No results for input(s): CHOL, HDL, LDLCALC, TRIG, CHOLHDL, LDLDIRECT in the last 72 hours. Thyroid Function Tests: No results for input(s): TSH, T4TOTAL, FREET4, T3FREE, THYROIDAB in the last 72 hours. Anemia Panel: No results for input(s): VITAMINB12, FOLATE, FERRITIN, TIBC, IRON, RETICCTPCT in the last 72 hours. Sepsis  Labs: No results for input(s): PROCALCITON, LATICACIDVEN in the last 168 hours.  Recent Results (from the past 240 hour(s))  Respiratory Panel by RT PCR (Flu A&B, Covid) - Nasopharyngeal Swab     Status: None   Collection Time: 07/05/19 11:14 PM   Specimen: Nasopharyngeal Swab  Result Value Ref Range Status   SARS Coronavirus 2 by RT PCR NEGATIVE NEGATIVE Final    Comment: (NOTE) SARS-CoV-2 target nucleic acids are NOT DETECTED. The SARS-CoV-2 RNA is generally detectable in upper respiratoy specimens during the acute phase of infection. The lowest concentration of SARS-CoV-2 viral copies this assay can detect is 131 copies/mL. A negative result does not preclude SARS-Cov-2 infection and should not be used as the sole basis for treatment or other patient  management decisions. A negative result may occur with  improper specimen collection/handling, submission of specimen other than nasopharyngeal swab, presence of viral mutation(s) within the areas targeted by this assay, and inadequate number of viral copies (<131 copies/mL). A negative result must be combined with clinical observations, patient history, and epidemiological information. The expected result is Negative. Fact Sheet for Patients:  PinkCheek.be Fact Sheet for Healthcare Providers:  GravelBags.it This test is not yet ap proved or cleared by the [place] FDA and  has been authorized for detection and/or diagnosis of SARS-CoV-2 by FDA under an Emergency Use Authorization (EUA). This EUA will remain  in effect (meaning this test can be used) for the duration of the COVID-19 declaration under Section 564(b)(1) of the Act, 21 U.S.C. section 360bbb-3(b)(1), unless the authorization is terminated or revoked sooner.    Influenza A by PCR NEGATIVE NEGATIVE Final   Influenza B by PCR NEGATIVE NEGATIVE Final    Comment: (NOTE) The Xpert Xpress SARS-CoV-2/FLU/RSV assay is intended as an aid in  the diagnosis of influenza from Nasopharyngeal swab specimens and  should not be used as a sole basis for treatment. Nasal washings and  aspirates are unacceptable for Xpert Xpress SARS-CoV-2/FLU/RSV  testing. Fact Sheet for Patients: PinkCheek.be Fact Sheet for Healthcare Providers: GravelBags.it This test is not yet approved or cleared by the [place] FDA and  has been authorized for detection and/or diagnosis of SARS-CoV-2 by  FDA under an Emergency Use Authorization (EUA). This EUA will remain  in effect (meaning this test can be used) for the duration of the  Covid-19 declaration under Section 564(b)(1) of the Act, 21  U.S.C. section 360bbb-3(b)(1), unless the  authorization is  terminated or revoked. Performed at [facility], [address] [place], [place] [address]   MRSA PCR Screening     Status: None   Collection Time: 07/06/19  1:44 AM   Specimen: Nasal Mucosa; Nasopharyngeal  Result Value Ref Range Status   MRSA by PCR NEGATIVE NEGATIVE Final    Comment:        The GeneXpert MRSA Assay (FDA approved for NASAL specimens only), is one component of a comprehensive MRSA colonization surveillance program. It is not intended to diagnose MRSA infection nor to guide or monitor treatment for MRSA infections. Performed at [facility], [address] [place],  [address]          Radiology Studies: EEG  Result Date: 07/06/2019 Lora Havens, MD     07/06/2019  3:49 PM Patient Name: [name] MRN: [id number] Epilepsy Attending: Lora Havens Referring Physician/Provider: Dr Binnie Kand Date: 07/06/2019 Duration: 23.28 minutes Patient history: 59 year old male with past medical history of CVA with residual  right-sided weakness, alcohol abuse, diabetes, CKD on dialysis who presented to the emergency department with altered mental status and seizure in the setting of hyperosmolar hyperglycemic state.  EEG to evaluate for seizures. Level of alertness: Lethargic AEDs during EEG study: Keppra Technical aspects: This EEG study was done with scalp electrodes positioned according to the 10-20 International system of electrode placement. Electrical activity was acquired at a sampling rate of 500Hz  and reviewed with a high frequency filter of 70Hz  and a low frequency filter of 1Hz . EEG data were recorded continuously and digitally stored. Description: No clear posterior dominant rhythm was seen.  EEG showed continuous generalized 2 to 3 Hz delta slowing.  Of note, study was technically difficult due to significant myogenic and electrode artifact. Hyperventilation and photic stimulation were not performed.  Abnormality Continued slow, generalized IMPRESSION: This technically difficult study is suggestive of moderate to severe diffuse encephalopathy, nonspecific to etiology.  No seizures or epileptiform discharges were seen throughout the recording. If concern for interictal activity remains, can consider repeat study with sleep. Lora Havens   CT Head Wo Contrast  Result Date: 07/05/2019 CLINICAL DATA:  Altered mental status EXAM: CT HEAD WITHOUT CONTRAST TECHNIQUE: Contiguous axial images were obtained from the base of the skull through the vertex without intravenous contrast. COMPARISON:  CT head 06/04/2019 FINDINGS: Brain: Stable regions of encephalomalacia in the bilateral frontal lobes, insula and right caudate likely as a result of prior ischemic insult. Additional areas of remote lacunar infarct again seen in the basal ganglia. No convincing areas of acute CT evident infarction. Evaluation of the inferior temporal lobes and cerebellum are limited due to motion artifact. Patchy areas of white matter hypoattenuation are most compatible with chronic microvascular angiopathy. Symmetric prominence of the ventricles, cisterns and sulci compatible with parenchymal volume loss. Vascular: Atherosclerotic calcification of the carotid siphons and intradural vertebral arteries. No hyperdense vessel though evaluation of the skull base is limited due to motion. Skull: Motion artifact may limit detection of subtle osseous injuries. Sinuses/Orbits: Persistent chronic opacification of the left frontal sinus. No air-fluid levels. Remaining paranasal sinuses and mastoid air cells are grossly clear. Orbital structures are difficult to evaluate in the setting of motion. Other: None IMPRESSION: Imaging quality is significantly degraded by patient motion artifact particularly towards the inferior temporal lobes, skull base and posterior fossa. No definite acute intracranial abnormality. Stable regions of encephalomalacia in  the frontal lobes, insula, caudate and basal ganglia. Chronic microvascular angiopathy changes and parenchymal volume loss, similar to prior. Electronically Signed   By: Lovena Le M.D.   On: 07/05/2019 22:58   MR BRAIN WO CONTRAST  Result Date: 07/06/2019 CLINICAL DATA:  Seizure, history of stroke EXAM: MRI HEAD WITHOUT CONTRAST TECHNIQUE: Multiplanar, multiecho pulse sequences of the brain and surrounding structures were obtained without intravenous contrast. COMPARISON:  2017 FINDINGS: Motion artifact is present. Brain: There is no acute infarction or hemorrhage. Encephalomalacia is present in the left frontal lobe and insula including involvement of the precentral gyrus with associated volume loss. Smaller chronic infarcts are present in the right frontal lobe. There is a chronic small vessel infarct of the right caudate. New T2 hyperintensity in the right parieto-occipital region without diffusion abnormality appears to be primarily subcortical. Additional patchy T2 hyperintensity in the supratentorial white matter is nonspecific but probably reflects stable chronic microvascular ischemic changes. Susceptibility weighted imaging is degraded by motion. No evidence of hemorrhage. There is no intracranial mass or mass effect. Prominence of the ventricles and sulci  reflects similar generalized parenchymal volume loss. Vascular: As before, there is loss of the normal flow voids of the visualized internal carotid arteries. Skull and upper cervical spine: Normal marrow signal is preserved. Sinuses/Orbits: Chronic left frontal sinus opacification. Orbits are unremarkable. Other: Mastoid air cells are clear.  Sella is unremarkable. IMPRESSION: Suboptimal evaluation due to motion artifact. No acute infarction or hemorrhage. Multiple chronic infarcts involving bilateral cerebral hemispheres. New subcortical T2 hyperintensity in the right parietooccipital region without definite cortical involvement. This could  reflect atypical unilateral PRES. Seizure related edema secondary to occipital seizure in the setting of hyperosmolar hyperglycemic state is a possibility but imaging appearance is not convincing for this. Electronically Signed   By: Macy Mis M.D.   On: 07/06/2019 12:33   DG Hand 2 View Right  Result Date: 07/05/2019 CLINICAL DATA:  Found down, right hand swelling and weakness EXAM: RIGHT HAND - 2 VIEW COMPARISON:  None. FINDINGS: Frontal and lateral views of the right hand are obtained. There are no acute displaced fractures alignment is grossly anatomic. Joint spaces are well preserved. Mild diffuse soft tissue edema is noted. IMPRESSION: 1. Soft tissue edema.  No acute bony abnormality Electronically Signed   By: Randa Ngo M.D.   On: 07/05/2019 22:37   DG Chest Port 1 View  Result Date: 07/05/2019 CLINICAL DATA:  Altered level of consciousness, found down, end-stage renal disease EXAM: PORTABLE CHEST 1 VIEW COMPARISON:  06/05/2019 FINDINGS: Two frontal views of the chest demonstrate right-sided dialysis catheter unchanged. Cardiac silhouette is stable. There is chronic central vascular congestion, with increased interstitial prominence likely representing fluid overload. No effusion or pneumothorax. No acute bony abnormalities. IMPRESSION: 1. Vascular congestion and increased interstitial prominence consistent with fluid overload. Electronically Signed   By: Randa Ngo M.D.   On: 07/05/2019 22:38        Scheduled Meds: . amLODipine  10 mg Oral Daily  . Chlorhexidine Gluconate Cloth  6 each Topical Daily  . heparin  5,000 Units Subcutaneous Q8H  . insulin aspart  0-9 Units Subcutaneous TID WC  . metoprolol tartrate  50 mg Oral BID  . potassium chloride  40 mEq Oral Q4H  . sodium chloride flush  3 mL Intravenous Q12H  . sodium chloride flush  3 mL Intravenous Q12H  . [START ON 07/08/2019] thiamine injection  100 mg Intravenous Daily   Continuous Infusions: . sodium chloride     . levETIRAcetam 500 mg (07/07/19 0404)     LOS: 1 day    Time spent: 40 to 89 minutes    Ezekiel Slocumb, DO [facility]   If 7PM-7AM, please contact night-coverage [email or web address] 07/07/2019, 3:05 PM

## 2019-07-07 NOTE — Plan of Care (Signed)
  Problem: Clinical Measurements: Goal: Ability to maintain clinical measurements within normal limits will improve Outcome: Progressing Goal: Will remain free from infection Outcome: Progressing Goal: Diagnostic test results will improve Outcome: Progressing Goal: Respiratory complications will improve Outcome: Progressing Goal: Cardiovascular complication will be avoided Outcome: Progressing  HD Tx Completed. 2L OF UF removed

## 2019-07-07 NOTE — Progress Notes (Signed)
Speech Language Pathology Treatment: Dysphagia  Patient Details Name: [name] MRN: [id number] DOB: 06-16-1960 Today's Date: 07/07/2019 Time: [id number] SLP Time Calculation (min) (ACUTE ONLY): 40 min  Assessment / Plan / Recommendation Clinical Impression  Pt seen for ongoing assessment of swallowing; toleration of oral diet. Pt has improved in his alertness over night. He now has an oral diet ordered by MD and was feeding himself upon entering room. Pt was reclined in bed and needed education and cues/support to sit more upright for safer oral intake and to lessen risk for aspiration/choking. Pt was verbal w/ SLP; he requested certain food items. Noted intermittent, min-mod expressive language deficits during conversation -- pt seemed aware of his expressive mistakes and attempted to correct them. Per MRI history: "Chronic left greater than right MCA territory infarcts with Wallerian degeneration; chronic BILATERAL ICA Occlusion; new hyperintensity in the right parietooccipital region". Unsure of pt's Baseline Cognitive-linguistic status/abilities; recommend f/u at discharge to determine if New changes/decline, and any needs w/ ADLs. Unsure of pt's discharge plan at this time.  Pt required positioning more upright in bed. Tray setup had been done for pt. Pt was feeding self. Noted min+ impulsive feeding behaviors -- reduced monitoring of how much he was putting in mouth at times. Pt was not fully clearing mouth b/t bites as well. This all resulted in "cheeked" foods on the R side, increased mashing/gumming time also d/t pt's Edentulous status. A mild cough was noted during a lengthier time of mashing/gumming and swallowing a mouthful of foods. Education and instruction given on the needs to take ONE bite at a time, mash well and swallow to clear b/f putting more food or another bite in his mouth. Pt continued to take 1-2 bites at a time; instructed him and gave verbal/visual cues to use Finger  Sweeping to clear the R cheek. Pt followed this instruction as the meal continued and alternated at times w/ a sip of liquid to aid clearing. No other clinical s/s of aspiration noted w/ sips of liquids. Oral phase appeared hampered by Edentulous status and decreased attention/awareness to oral clearing b/t bites -- unsure if Baseline for pt.  Recommend more of a Mech Soft diet w/ meats well-cut w/ gravies to moisten, Thin liquids. Recommend general aspiration precautions. Education and verbal/visual/tactile cues given to pt to instruct/encourage pt to clear the R side of his cheek/mouth of the pocketed foods when eating meals; also to take Smaller bites and Alternate foods/liquids to aid oral clearing. Recommend Pills in puree if easier for swallowing and/or if any difficulty swallowing w/ liquids. ST services will continue to monitor pt's status while admitted and provide education. Precautions posted; NSG updated.       HPI HPI: Pt is a 59 y.o. male with Multiple medical history and issues including ETOH abuse, GERD, Monilial esophagitis, Protein-calorie malnutrition - severe,  hypertension, end-stage renal disease on dialysis, AKI, ARF, UTIs, Falls, diabetes mellitus type 2, depression, anemia, ETOH/alcohol abuse, Confusion, history of CVA with right-sided weakness. Pt came to the ED with change in mental status possible seizures like activity. Last Dialysis unknown. Pt admitted w/ ADM in setting of hyperosmolar hyperglycemic state; Blood Sugar was 1000. Sodium was 128. Encephalopathy present.       SLP Plan  Continue with current plan of care       Recommendations  Diet recommendations: Dysphagia 3 (mechanical soft);Thin liquid(cut meats, moistened foods d/t Edentulous status) Liquids provided via: Cup;Straw Medication Administration: Whole meds with puree(IF needed for  easier/safer swallowing) Supervision: Patient able to self feed;Intermittent supervision to cue for compensatory  strategies(cues) Compensations: Minimize environmental distractions;Slow rate;Small sips/bites;Lingual sweep for clearance of pocketing;Multiple dry swallows after each bite/sip;Follow solids with liquid(Finger Sweep to clear R cheek) Postural Changes and/or Swallow Maneuvers: Seated upright 90 degrees;Upright 30-60 min after meal                General recommendations: (Dietician f/u as needed) Oral Care Recommendations: Oral care BID;Oral care before and after PO;Staff/trained caregiver to provide oral care Follow up Recommendations: None SLP Visit Diagnosis: Dysphagia, oral phase (R13.11)(old CVA w/ R sided weakness) Plan: Continue with current plan of care       GO                 [name], MS, [name] Duante Arocho 07/07/2019, 10:09 AM

## 2019-07-07 NOTE — Progress Notes (Signed)
PosT HD Tx Assessment   07/07/19 1245  Neurological  Level of Consciousness Alert  Orientation Level Disoriented to time  Respiratory  Respiratory Pattern Regular;Unlabored  Chest Assessment Chest expansion symmetrical  Bilateral Breath Sounds Clear;Diminished  Cardiac  Pulse Regular  Heart Sounds S1, S2;No adventitious heart sounds  Jugular Venous Distention (JVD) No  ECG Monitor Yes  Cardiac Rhythm NSR  Vascular  R Radial Pulse +1  L Radial Pulse +1  R Dorsalis Pedis Pulse +1  L Dorsalis Pedis Pulse +1  Edema Right upper extremity  RUE Edema Non-pitting  Integumentary  Integumentary (WDL) X  Skin Integrity Abrasion  Musculoskeletal  Musculoskeletal (WDL) X  Generalized Weakness Yes  Gastrointestinal  Bowel Sounds Assessment Active  Last BM Date 07/07/19  GU Assessment  Genitourinary (WDL) X  Genitourinary Symptoms External catheter  Psychosocial  Psychosocial (WDL) X  Patient Behaviors Restless

## 2019-07-07 NOTE — Progress Notes (Signed)
Post HD Tx Note   Pt tolerated well the Tx and reports no chest pain or SOB.    07/07/19 1252  Hand-Off documentation  Report given to (Full Name) Cameron Proud RN   Report received from (Full Name) Newt Minion   Vital Signs  Temp 98.4 F (36.9 C)  Temp Source Oral  Pulse Rate 80  Pulse Rate Source Monitor  Resp 18  BP 131/84  BP Location Right Arm  BP Method Automatic  Patient Position (if appropriate) Lying  Oxygen Therapy  SpO2 100 %  O2 Device Room Air  Pain Assessment  Pain Scale 0-10  Pain Score 0  Post-Hemodialysis Assessment  Rinseback Volume (mL) 250 mL  KECN 58 V  Dialyzer Clearance Lightly streaked  Duration of HD Treatment -hour(s) 3 hour(s)  Hemodialysis Intake (mL) 500 mL  UF Total -Machine (mL) 2015 mL  Net UF (mL) 1515 mL  Tolerated HD Treatment Yes  Hemodialysis Catheter Right Internal jugular Double lumen Permanent (Tunneled)  Placement Date/Time: 04/10/19 1440   Placed prior to admission: Yes  Time Out: Correct patient;Correct site;Correct procedure  Maximum sterile barrier precautions: Hand hygiene;Cap;Sterile gown;Mask;Sterile gloves;Large sterile sheet  Site Prep: Chlor...  Site Condition No complications  Blue Lumen Status Heparin locked  Red Lumen Status Heparin locked  Purple Lumen Status N/A  Catheter fill solution Heparin 1000 units/ml  Catheter fill volume (Arterial) 1.6 cc  Catheter fill volume (Venous) 1.6  Dressing Type Biopatch  Dressing Status Clean;Dry;Intact  Drainage Description None  Dressing Change Due 07/09/19  Post treatment catheter status Capped and Clamped

## 2019-07-07 NOTE — Progress Notes (Signed)
Pre HD Tx Note  Pt arrived from CCU to receive routine HD. Pt is Alert*3. On RA on SPO2 100%. Pt reports no chest pain or SOB. BP WDL. CVC WDL   07/07/19 0930  Hand-Off documentation  Report given to (Full Name) Newt Minion Rn   Report received from (Full Name) Cameron Proud RN   Vital Signs  Temp 98.7 F (37.1 C)  Temp Source Oral  Pulse Rate 88  Pulse Rate Source Monitor  Resp 17  BP (!) 140/96  Oxygen Therapy  SpO2 98 %  O2 Device Room Air  Pain Assessment  Pain Scale 0-10  Pain Score 0  Dialysis Weight  Weight 62.6 kg  Type of Weight Pre-Dialysis  Time-Out for Hemodialysis  What Procedure? HD  Pt Identifiers(min of two) First/Last Name;MRN/Account#  Correct Site? Yes  Correct Side? Yes  Correct Procedure? Yes  Consents Verified? Yes  Safety Precautions Reviewed? Yes  Engineer, civil (consulting) Number 6  Station Number 4  UF/Alarm Test Passed  Conductivity: Meter 14  Conductivity: Machine  14  pH 7.4  Reverse Osmosis Main  Normal Saline Lot Number [id number]  Dialyzer Lot Number [id number]  Disposable Set Lot Number [id number]  Machine Temperature 98.6 F (37 C)  Musician and Audible Yes  Blood Lines Intact and Secured Yes  Pre Treatment Patient Checks  Vascular access used during treatment Catheter  HD catheter dressing before treatment WDL  Patient is receiving dialysis in a chair  (In Bed)  Hepatitis B Surface Antigen Results Negative  Date Hepatitis B Surface Antigen Drawn 04/13/19  Hepatitis B Surface Antibody  (Immune)  Date Hepatitis B Surface Antibody Drawn 04/13/19  Hemodialysis Consent Verified Yes  Hemodialysis Standing Orders Initiated Yes  ECG (Telemetry) Monitor On Yes  Prime Ordered Normal Saline  Length of  DialysisTreatment -hour(s) 3 Hour(s)  Dialysis Treatment Comments  (Na140)  Dialyzer Elisio 17H NR  Dialysate 3K;2.5 Ca  Dialysis Anticoagulant None  Dialysate Flow Ordered 600  Blood Flow Rate Ordered 400 mL/min  Ultrafiltration  Goal 1.5 Liters  Pre Treatment Labs CBC  Dialysis Blood Pressure Support Ordered Albumin  Education / Care Plan  Dialysis Education Provided Yes  Documented Education in Care Plan Yes  Hemodialysis Catheter Right Internal jugular Double lumen Permanent (Tunneled)  Placement Date/Time: 04/10/19 1440   Placed prior to admission: Yes  Time Out: Correct patient;Correct site;Correct procedure  Maximum sterile barrier precautions: Hand hygiene;Cap;Sterile gown;Mask;Sterile gloves;Large sterile sheet  Site Prep: Chlor...  Site Condition No complications  Blue Lumen Status Infusing;Flushed;Blood return noted  Red Lumen Status Infusing;Flushed;Blood return noted  Purple Lumen Status N/A  Dressing Type Biopatch  Dressing Status Clean;Dry;Intact  Drainage Description None

## 2019-07-07 NOTE — Progress Notes (Signed)
Pt is sitting up in bed alert and oriented to self and place only. Pt is somewhat agitated but easily calmed and redirected. Bedside swallow study completed by RN with passing results. Will notify MD. Systolic BP 864. Gave PRN Hydralazine to met BP goal per MD.  Will continue to monitor.

## 2019-07-07 NOTE — Progress Notes (Signed)
PT Cancellation Note  Patient Details Name: [name] MRN: [id number] DOB: 06-15-1960   Cancelled Treatment:    Reason Eval/Treat Not Completed: Medical issues which prohibited therapy;Patient at procedure or test/unavailable(Chart reviewed. Pt off floor for HD. Will attempt PT evaluation at later date/time.)  12:42 PM, 07/07/19 Etta Grandchild, PT, DPT Physical Therapist - [facility]  [phone] ([place])    Toyah C 07/07/2019, 12:42 PM

## 2019-07-07 NOTE — Progress Notes (Signed)
OT Cancellation Note  Patient Details Name: [name] MRN: [id number] DOB: 01-Oct-1960   Cancelled Treatment:    Reason Eval/Treat Not Completed: Patient at procedure or test/ unavailable(Thank you for the OT consult. Order recieved and chart reviewed. Pt noted to be at HD at this time. Will hold OT evaluation and initiate services as available and pt medically appropriate for OT eval.)   Shara Blazing, M.S., OTR/L Ascom: [phone] 07/07/19, 10:44 AM

## 2019-07-07 NOTE — Progress Notes (Signed)
HD Tx Completed W/out complications   [id number]  Vital Signs  Pulse Rate 82  BP 128/87  Oxygen Therapy  SpO2 98 %  O2 Device Room Air  Pain Assessment  Pain Scale 0-10  Pain Score 0  During Hemodialysis Assessment  Blood Flow Rate (mL/min) 400 mL/min  Arterial Pressure (mmHg) -150 mmHg  Venous Pressure (mmHg) 150 mmHg  Transmembrane Pressure (mmHg) 70 mmHg  Ultrafiltration Rate (mL/min) 870 mL/min  Dialysate Flow Rate (mL/min) 600 ml/min  Conductivity: Machine  13.9  HD Safety Checks Performed Yes  Intra-Hemodialysis Comments Progressing as prescribed

## 2019-07-07 NOTE — Progress Notes (Signed)
HD TX Started W/Out complications    [id number]  Vital Signs  Pulse Rate 81  Resp 13  BP (!) 142/90  Oxygen Therapy  SpO2 99 %  O2 Device Room Air  Pain Assessment  Pain Scale 0-10  Pain Score 0  During Hemodialysis Assessment  Blood Flow Rate (mL/min) 400 mL/min  Arterial Pressure (mmHg) -180 mmHg  Venous Pressure (mmHg) 180 mmHg  Transmembrane Pressure (mmHg) 70 mmHg  Ultrafiltration Rate (mL/min) 660 mL/min  Dialysate Flow Rate (mL/min) 600 ml/min  Conductivity: Machine  13.8  HD Safety Checks Performed Yes  Dialysis Fluid Bolus Normal Saline  Bolus Amount (mL) 250 mL  Intra-Hemodialysis Comments Tx initiated

## 2019-07-08 DIAGNOSIS — I161 Hypertensive emergency: Secondary | ICD-10-CM

## 2019-07-08 LAB — GLUCOSE, CAPILLARY
Glucose-Capillary: 369 mg/dL — ABNORMAL HIGH (ref 70–99)
Glucose-Capillary: 305 mg/dL — ABNORMAL HIGH (ref 70–99)
Glucose-Capillary: 169 mg/dL — ABNORMAL HIGH (ref 70–99)

## 2019-07-08 LAB — COMPREHENSIVE METABOLIC PANEL
ALT: 18 U/L (ref 0–44)
AST: 34 U/L (ref 15–41)
Albumin: 2.2 g/dL — ABNORMAL LOW (ref 3.5–5.0)
Alkaline Phosphatase: 206 U/L — ABNORMAL HIGH (ref 38–126)
Anion gap: 10 (ref 5–15)
BUN: 14 mg/dL (ref 6–20)
CO2: 27 mmol/L (ref 22–32)
Calcium: 7.8 mg/dL — ABNORMAL LOW (ref 8.9–10.3)
Chloride: 99 mmol/L (ref 98–111)
Creatinine, Ser: 2.92 mg/dL — ABNORMAL HIGH (ref 0.61–1.24)
GFR calc Af Amer: 26 mL/min — ABNORMAL LOW (ref >60)
GFR calc non Af Amer: 22 mL/min — ABNORMAL LOW (ref >60)
Glucose, Bld: 294 mg/dL — ABNORMAL HIGH (ref 70–99)
Potassium: 3.2 mmol/L — ABNORMAL LOW (ref 3.5–5.1)
Sodium: 136 mmol/L (ref 135–145)
Total Bilirubin: 1 mg/dL (ref 0.3–1.2)
Total Protein: 5.4 g/dL — ABNORMAL LOW (ref 6.5–8.1)

## 2019-07-08 LAB — CBC
HCT: 30.3 % — ABNORMAL LOW (ref 39.0–52.0)
Hemoglobin: 9.9 g/dL — ABNORMAL LOW (ref 13.0–17.0)
MCH: 28.9 pg (ref 26.0–34.0)
MCHC: 32.7 g/dL (ref 30.0–36.0)
MCV: 88.6 fL (ref 80.0–100.0)
Platelets: 181 10*3/uL (ref 150–400)
RBC: 3.42 MIL/uL — ABNORMAL LOW (ref 4.22–5.81)
RDW: 15 % (ref 11.5–15.5)
WBC: 7.3 10*3/uL (ref 4.0–10.5)
nRBC: 0 % (ref 0.0–0.2)

## 2019-07-08 LAB — PHOSPHORUS: Phosphorus: 2.8 mg/dL (ref 2.5–4.6)

## 2019-07-08 LAB — MAGNESIUM: Magnesium: 1.9 mg/dL (ref 1.7–2.4)

## 2019-07-08 MED ORDER — HYDRALAZINE HCL 50 MG PO TABS
25.0000 mg | ORAL_TABLET | Freq: Three times a day (TID) | ORAL | Status: DC
  Administered 2019-07-08 (×2): 25 mg via ORAL
  Filled 2019-07-08 (×2): qty 1

## 2019-07-08 MED ORDER — METOPROLOL TARTRATE 50 MG PO TABS: 50.0000 mg | ORAL_TABLET | Freq: Two times a day (BID) | ORAL | 1 refills | Status: DC

## 2019-07-08 MED ORDER — INSULIN GLARGINE 100 UNIT/ML ~~LOC~~ SOLN
15.0000 [IU] | Freq: Every day | SUBCUTANEOUS | Status: DC
  Filled 2019-07-08: qty 0.15

## 2019-07-08 MED ORDER — INSULIN ASPART 100 UNIT/ML ~~LOC~~ SOLN
7.0000 [IU] | Freq: Three times a day (TID) | SUBCUTANEOUS | Status: DC
  Administered 2019-07-08: 7 [IU] via SUBCUTANEOUS

## 2019-07-08 MED ORDER — RENA-VITE PO TABS: 1.0000 | ORAL_TABLET | Freq: Every day | ORAL | Status: DC

## 2019-07-08 MED ORDER — THIAMINE HCL 100 MG PO TABS: 100.0000 mg | ORAL_TABLET | Freq: Every day | ORAL | 1 refills | Status: DC

## 2019-07-08 MED ORDER — HYDRALAZINE HCL 25 MG PO TABS: 25.0000 mg | ORAL_TABLET | Freq: Three times a day (TID) | ORAL | 1 refills | Status: DC

## 2019-07-08 MED ORDER — AMLODIPINE BESYLATE 10 MG PO TABS: 10.0000 mg | ORAL_TABLET | Freq: Every day | ORAL | 1 refills | Status: DC

## 2019-07-08 MED ORDER — NEPRO/CARBSTEADY PO LIQD: 237.0000 mL | Freq: Three times a day (TID) | ORAL | 0 refills | Status: AC

## 2019-07-08 MED ORDER — RENA-VITE PO TABS: 1.0000 | ORAL_TABLET | Freq: Every day | ORAL | 1 refills | Status: DC

## 2019-07-08 MED ORDER — LEVETIRACETAM 500 MG PO TABS: 500.0000 mg | ORAL_TABLET | Freq: Two times a day (BID) | ORAL | 0 refills | Status: DC

## 2019-07-08 MED ORDER — NEPRO/CARBSTEADY PO LIQD: 237.0000 mL | Freq: Three times a day (TID) | ORAL | Status: DC

## 2019-07-08 MED ORDER — POTASSIUM CHLORIDE CRYS ER 20 MEQ PO TBCR
40.0000 meq | EXTENDED_RELEASE_TABLET | ORAL | Status: AC
  Administered 2019-07-08 (×2): 40 meq via ORAL
  Filled 2019-07-08 (×2): qty 2

## 2019-07-08 MED ORDER — INSULIN LISPRO (1 UNIT DIAL) 100 UNIT/ML (KWIKPEN): 0.0000 [IU] | PEN_INJECTOR | Freq: Three times a day (TID) | SUBCUTANEOUS | 11 refills | Status: DC

## 2019-07-08 NOTE — Progress Notes (Signed)
[facility]  ROUNDING NOTE   Subjective:   More alert and oriented this morning.   Hemodialysis treatment yesterday. Tolerated treatment well. Uf of 1536mL.   Objective:  Vital signs in last 24 hours:  Temp:  [98.2 F (36.8 C)-98.7 F (37.1 C)] 98.2 F (36.8 C) (02/24 0000) Pulse Rate:  [73-94] 79 (02/23 2202) Resp:  [8-21] 18 (02/23 1800) BP: (120-183)/(79-166) 165/93 (02/24 0320) SpO2:  [94 %-100 %] 97 % (02/23 1800) Weight:  [62.6 kg] 62.6 kg (02/23 0930)  Weight change:  Filed Weights   07/05/19 2131 07/06/19 0138 07/07/19 0930  Weight: 64.4 kg 62.6 kg 62.6 kg    Intake/Output: I/O last 3 completed shifts: In: 410 [I.V.:10; IV Piggyback:400] Out: [id number] [Urine:350; Other:1515]   Intake/Output this shift:  No intake/output data recorded.  Physical Exam: General: NAD, laying in bed  Head: Normocephalic, atraumatic. Moist oral mucosal membranes  Eyes: Anicteric, PERRL  Neck: Supple, trachea midline  Lungs:  Clear to auscultation  Heart: Regular rate and rhythm  Abdomen:  Soft, nontender,   Extremities: no peripheral edema.  Neurologic: Oriented to self and place  Skin: No lesions  Access: RIJ permcath    Basic Metabolic Panel: Recent Labs  Lab 07/05/19 2156 07/05/19 2156 07/06/19 0244 07/07/19 0441 07/08/19 0516  NA 128*  --  135 136 136  K 3.5  --  2.9* 3.0* 3.2*  CL 90*  --  98 100 99  CO2 27  --  23 26 27   GLUCOSE 1,059*  --  453* 112* 294*  BUN 17  --  19 19 14   CREATININE 3.37*  --  3.44* 3.99* 2.92*  CALCIUM 8.1*   < > 8.5* 8.2* 7.8*  MG  --   --  1.7 1.7 1.9  PHOS  --   --  3.4 3.3 2.8   < > = values in this interval not displayed.    Liver Function Tests: Recent Labs  Lab 07/05/19 2156 07/06/19 0244 07/07/19 0441 07/08/19 0516  AST 27 30 27  34  ALT 20 18 17 18   ALKPHOS 267* 241* 200* 206*  BILITOT 0.7 0.5 0.8 1.0  PROT 6.6 6.4* 5.5* 5.4*  ALBUMIN 2.7* 2.6* 2.2* 2.2*   No results for input(s): LIPASE, AMYLASE in  the last 168 hours. No results for input(s): AMMONIA in the last 168 hours.  CBC: Recent Labs  Lab 07/05/19 2156 07/06/19 0244 07/07/19 0441 07/07/19 1100 07/08/19 0516  WBC 10.4 12.5* 8.7 8.4 7.3  NEUTROABS 8.9*  --   --   --   --   HGB 11.3* 9.8* 10.6* 9.8* 9.9*  HCT 34.9* 28.7* 32.4* 29.0* 30.3*  MCV 89.5 86.2 87.3 86.6 88.6  PLT 206 210 217 175 181    Cardiac Enzymes: No results for input(s): CKTOTAL, CKMB, CKMBINDEX, TROPONINI in the last 168 hours.  BNP: Invalid input(s): POCBNP  CBG: Recent Labs  Lab 07/07/19 0044 07/07/19 0523 07/07/19 1319 07/07/19 1824 07/07/19 2342  GLUCAP 79 114* 137* 260* 204*    Microbiology: Results for orders placed or performed during the hospital encounter of 07/05/19  Respiratory Panel by RT PCR (Flu A&B, Covid) - Nasopharyngeal Swab     Status: None   Collection Time: 07/05/19 11:14 PM   Specimen: Nasopharyngeal Swab  Result Value Ref Range Status   SARS Coronavirus 2 by RT PCR NEGATIVE NEGATIVE Final    Comment: (NOTE) SARS-CoV-2 target nucleic acids are NOT DETECTED. The SARS-CoV-2 RNA is generally detectable in  upper respiratoy specimens during the acute phase of infection. The lowest concentration of SARS-CoV-2 viral copies this assay can detect is 131 copies/mL. A negative result does not preclude SARS-Cov-2 infection and should not be used as the sole basis for treatment or other patient management decisions. A negative result may occur with  improper specimen collection/handling, submission of specimen other than nasopharyngeal swab, presence of viral mutation(s) within the areas targeted by this assay, and inadequate number of viral copies (<131 copies/mL). A negative result must be combined with clinical observations, patient history, and epidemiological information. The expected result is Negative. Fact Sheet for Patients:  PinkCheek.be Fact Sheet for Healthcare Providers:   GravelBags.it This test is not yet ap proved or cleared by the [place] FDA and  has been authorized for detection and/or diagnosis of SARS-CoV-2 by FDA under an Emergency Use Authorization (EUA). This EUA will remain  in effect (meaning this test can be used) for the duration of the COVID-19 declaration under Section 564(b)(1) of the Act, 21 U.S.C. section 360bbb-3(b)(1), unless the authorization is terminated or revoked sooner.    Influenza A by PCR NEGATIVE NEGATIVE Final   Influenza B by PCR NEGATIVE NEGATIVE Final    Comment: (NOTE) The Xpert Xpress SARS-CoV-2/FLU/RSV assay is intended as an aid in  the diagnosis of influenza from Nasopharyngeal swab specimens and  should not be used as a sole basis for treatment. Nasal washings and  aspirates are unacceptable for Xpert Xpress SARS-CoV-2/FLU/RSV  testing. Fact Sheet for Patients: PinkCheek.be Fact Sheet for Healthcare Providers: GravelBags.it This test is not yet approved or cleared by the [place] FDA and  has been authorized for detection and/or diagnosis of SARS-CoV-2 by  FDA under an Emergency Use Authorization (EUA). This EUA will remain  in effect (meaning this test can be used) for the duration of the  Covid-19 declaration under Section 564(b)(1) of the Act, 21  U.S.C. section 360bbb-3(b)(1), unless the authorization is  terminated or revoked. Performed at [facility], [address] [place], [place] [address]   MRSA PCR Screening     Status: None   Collection Time: 07/06/19  1:44 AM   Specimen: Nasal Mucosa; Nasopharyngeal  Result Value Ref Range Status   MRSA by PCR NEGATIVE NEGATIVE Final    Comment:        The GeneXpert MRSA Assay (FDA approved for NASAL specimens only), is one component of a comprehensive MRSA colonization surveillance program. It is not intended to diagnose MRSA infection nor to  guide or monitor treatment for MRSA infections. Performed at [facility], [address] [place], [place] [address]     Coagulation Studies: No results for input(s): LABPROT, INR in the last 72 hours.  Urinalysis: Recent Labs    07/06/19 0144  COLORURINE YELLOW*  LABSPEC 1.017  PHURINE 7.0  GLUCOSEU >=500*  HGBUR SMALL*  BILIRUBINUR NEGATIVE  KETONESUR NEGATIVE  PROTEINUR >=300*  NITRITE NEGATIVE  LEUKOCYTESUR NEGATIVE      Imaging: EEG  Result Date: 07/06/2019 Lora Havens, MD     07/06/2019  3:49 PM Patient Name: [name] MRN: [id number] Epilepsy Attending: Lora Havens Referring Physician/Provider: Dr Binnie Kand Date: 07/06/2019 Duration: 23.28 minutes Patient history: 59 year old male with past medical history of CVA with residual right-sided weakness, alcohol abuse, diabetes, CKD on dialysis who presented to the emergency department with altered mental status and seizure in the setting of hyperosmolar hyperglycemic state.  EEG to evaluate for seizures. Level of  alertness: Lethargic AEDs during EEG study: Keppra Technical aspects: This EEG study was done with scalp electrodes positioned according to the 10-20 International system of electrode placement. Electrical activity was acquired at a sampling rate of 500Hz  and reviewed with a high frequency filter of 70Hz  and a low frequency filter of 1Hz . EEG data were recorded continuously and digitally stored. Description: No clear posterior dominant rhythm was seen.  EEG showed continuous generalized 2 to 3 Hz delta slowing.  Of note, study was technically difficult due to significant myogenic and electrode artifact. Hyperventilation and photic stimulation were not performed. Abnormality Continued slow, generalized IMPRESSION: This technically difficult study is suggestive of moderate to severe diffuse encephalopathy, nonspecific to etiology.  No seizures or epileptiform discharges were seen throughout the  recording. If concern for interictal activity remains, can consider repeat study with sleep. Lora Havens   MR BRAIN WO CONTRAST  Result Date: 07/06/2019 CLINICAL DATA:  Seizure, history of stroke EXAM: MRI HEAD WITHOUT CONTRAST TECHNIQUE: Multiplanar, multiecho pulse sequences of the brain and surrounding structures were obtained without intravenous contrast. COMPARISON:  2017 FINDINGS: Motion artifact is present. Brain: There is no acute infarction or hemorrhage. Encephalomalacia is present in the left frontal lobe and insula including involvement of the precentral gyrus with associated volume loss. Smaller chronic infarcts are present in the right frontal lobe. There is a chronic small vessel infarct of the right caudate. New T2 hyperintensity in the right parieto-occipital region without diffusion abnormality appears to be primarily subcortical. Additional patchy T2 hyperintensity in the supratentorial white matter is nonspecific but probably reflects stable chronic microvascular ischemic changes. Susceptibility weighted imaging is degraded by motion. No evidence of hemorrhage. There is no intracranial mass or mass effect. Prominence of the ventricles and sulci reflects similar generalized parenchymal volume loss. Vascular: As before, there is loss of the normal flow voids of the visualized internal carotid arteries. Skull and upper cervical spine: Normal marrow signal is preserved. Sinuses/Orbits: Chronic left frontal sinus opacification. Orbits are unremarkable. Other: Mastoid air cells are clear.  Sella is unremarkable. IMPRESSION: Suboptimal evaluation due to motion artifact. No acute infarction or hemorrhage. Multiple chronic infarcts involving bilateral cerebral hemispheres. New subcortical T2 hyperintensity in the right parietooccipital region without definite cortical involvement. This could reflect atypical unilateral PRES. Seizure related edema secondary to occipital seizure in the setting of  hyperosmolar hyperglycemic state is a possibility but imaging appearance is not convincing for this. Electronically Signed   By: Macy Mis M.D.   On: 07/06/2019 12:33     Medications:   . sodium chloride    . levETIRAcetam 500 mg (07/08/19 0351)   . amLODipine  10 mg Oral Daily  . Chlorhexidine Gluconate Cloth  6 each Topical Daily  . heparin  5,000 Units Subcutaneous Q8H  . hydrALAZINE  25 mg Oral Q8H  . insulin aspart  0-9 Units Subcutaneous TID WC  . metoprolol tartrate  50 mg Oral BID  . potassium chloride  40 mEq Oral Q4H  . sodium chloride flush  3 mL Intravenous Q12H  . sodium chloride flush  3 mL Intravenous Q12H  . thiamine injection  100 mg Intravenous Daily   sodium chloride, dextrose, hydrALAZINE, labetalol, LORazepam, ondansetron **OR** ondansetron (ZOFRAN) IV, sodium chloride flush  Assessment/ Plan:  Mr. DORN KARP is a 59 y.o. black male with end stage renal disease on hemodialysis, diabetes mellitus type II, hypertension, CVA, hyperlipidemia, EtOH abuse who is admitted to [facility] on 07/05/2019 for Acute encephalopathy [  G93.40] Hyperosmolar hyperglycemic state (HHS) ([place]) [E11.00, E11.65]  [facility] TTS [address] RIJ permcath  1. End Stage Renal Disease: TTS schedule.  Dialysis for tomorrow.   2. Hypertension: Hypertensive urgency on admission. PRES possibly on MRI. Home regimen of amlodipine and metoprolol. Resumed.   3. Anemia of chronic kidney disease: hemoglobin 9.9 - EPO with HD treatment  4. Secondary Hyperparathyroidism: outpatient labs on 06/23/19: PTH 353, phos 4.8, calcium 8.1. Not currently on binders. Holding cinacalcet due to hypophosphatemia.   5. Hypokalemia - PO replacement - 3K bath with HD treatments.    LOS: 2 Zondra Lawlor 2/24/20219:04 AM

## 2019-07-08 NOTE — Evaluation (Signed)
Occupational Therapy Evaluation Patient Details Name: [name] MRN: [id number] DOB: 04/28/1961 Today's Date: 07/08/2019    History of Present Illness [name] is a 59 y/o M with PMH ESRD on HD TTS (started in December 2020), T2DM, ETOH abuse, HLD, HTN, and stroke with some residual R sided weakness. Pt presented to the emergency department with altered mental status and seizure in the setting of hyperosmolar hyperglycemic state and hypertensive emergency.  In the ED, BP was 200/134 (max reading 246/105), blood glucose >1000 without elevated anion gap.  Head CT negative for acute findings.   Clinical Impression   [name] was seen for OT evaluation this date. Prior to hospital admission ~1 month ago, pt endorses he was independent for ADL/IADL management using a SPC for community mobility. Since his recent admissions, pt states he has required increased assistance for bathing, dressing, and feeding. He states he has had "at least 3 falls" in the past week, and that his wife works "7 days a week" and is unable to provide consistent supervision/assist needed. Pt states he would, "just like to be able to sit on my porch and waive at my neighbors as they go by". Currently pt demonstrates impairments as described below (See OT problem list) which functionally limit his ability to perform ADL/self-care tasks. Pt currently requires moderate to max assistance for LB bathing and dressing tasks in a seated position as well as moderate assist for bed mobility and min assist for functional transfers this date.  Pt would benefit from skilled OT services to address noted impairments and functional limitations (see below for any additional details) in order to maximize safety and independence while minimizing falls risk and caregiver burden. Upon hospital discharge, recommend HHOT to maximize pt safety and return to functional independence during meaningful occupations of daily life.      Follow Up  Recommendations  Home health OT;Supervision/Assistance - 24 hour    Equipment Recommendations  3 in 1 bedside commode    Recommendations for Other Services       Precautions / Restrictions Precautions Precautions: Fall Precaution Comments: High fall Restrictions Weight Bearing Restrictions: No      Mobility Bed Mobility Overal bed mobility: Needs Assistance Bed Mobility: Supine to Sit;Sit to Supine     Supine to sit: Mod assist Sit to supine: Mod assist   General bed mobility comments: Pt requires assist to come to sitting at EOB and to maintain balance. Is unable to perform bed mobility without moderate assist from therapist for mgt of trunk/BLE this date.  Transfers Overall transfer level: Needs assistance Equipment used: 1 person hand held assist Transfers: Sit to/from Stand Sit to Stand: Min guard;Min assist         General transfer comment: Pt able to stand/side step at EOB given 1 person handheld assist this date. Signficant posterior lean noted. Pt returns to sitting with decreased safety awareness. Requires cueing t/o for safety.    Balance Overall balance assessment: Needs assistance Sitting-balance support: Feet supported;Single extremity supported Sitting balance-Leahy Scale: Poor Sitting balance - Comments: Pt unable to maintain balance while seated at EOB. Requires physical assist from therapist for support. Briefly able to maintain upright position w/ 1 UE and both feet supported. Does not maintain. Postural control: Posterior lean;Left lateral lean Standing balance support: Bilateral upper extremity supported Standing balance-Leahy Scale: Fair Standing balance comment: Pt stands at EOB, takes small side-steps to re-position, requires BUE support from therapist. Posterior lean.  ADL either performed or assessed with clinical judgement   ADL Overall ADL's : Needs assistance/impaired                                        General ADL Comments: Pt functionally limited by cognition, generalized weakness, poor activity tolerance, decreased balance, and pain this date. Pt requires moderate assist with bed mobility/functional transfers this date. He is unable to sit at EOB w/o significant support from therapist to maintain balance. Mod/max assist for LB bathing and dressing in a seated position. May require min a for self-feeding 2/2 residual R sided weakness from past strokes.     Vision Baseline Vision/History: No visual deficits Patient Visual Report: Diplopia Vision Assessment?: Vision impaired- to be further tested in functional context     Perception     Praxis      Pertinent Vitals/Pain Pain Assessment: Faces Faces Pain Scale: Hurts little more Pain Location: Pt endorses generalized R-sided pain. Pain Descriptors / Indicators: Discomfort;Sore Pain Intervention(s): Limited activity within patient's tolerance;Repositioned;Monitored during session     Hand Dominance Left   Extremity/Trunk Assessment Upper Extremity Assessment Upper Extremity Assessment: Generalized weakness   Lower Extremity Assessment Lower Extremity Assessment: Generalized weakness       Communication Communication Communication: No difficulties   Cognition Arousal/Alertness: Awake/alert Behavior During Therapy: WFL for tasks assessed/performed Overall Cognitive Status: No family/caregiver present to determine baseline cognitive functioning                                 General Comments: Pt endorses seeing "two people up there" and states "When I close my eyes and say a little prayer to God they go away". Pt also asks this author "How did you get up there?" appears to believe Pryor Curia is floating rather than standing at his bedside. No family/caregiver present to determine baseline. This Pryor Curia utilizes therapeutic use of self t/o session to re-assure pt. Pt primary RN notified.   General  Comments       Exercises Other Exercises Other Exercises: Pt educated on falls prevention strategies for home/hosptial, bed mobility techniques including log-roll technique for return to supine (pt unable to return demonstrate at time of eval), and safe transfer techniques this date. Pt would benfit from review.   Shoulder Instructions      Home Living Family/patient expects to be discharged to:: Private residence Living Arrangements: Spouse/significant other Available Help at Discharge: Family;Available PRN/intermittently Type of Home: House Home Access: Level entry     Home Layout: One level     Bathroom Shower/Tub: Occupational psychologist: Handicapped height Bathroom Accessibility: Yes   Home Equipment: Grab bars - tub/shower;Grab bars - toilet;Cane - single point;Walker - 2 wheels          Prior Functioning/Environment Level of Independence: Needs assistance  Gait / Transfers Assistance Needed: Pt endorses using RW in the home, however, he also endorses multiple falls since past admission (at least 3 that he can remember) generally in his living room. ADL's / Homemaking Assistance Needed: Pt states he requires assistance for nearly all ADLs including stating "I need a nurse to feed me". Pt unable to state if he has been set up with [facility] therapy since past admission.   Comments: Pt reports independent with all ADLS/IADLs, using a SPC for ambulation prior to  initial admission ~ 1 month ago.        OT Problem List: Decreased strength;Decreased coordination;Decreased activity tolerance;Decreased safety awareness;Decreased knowledge of use of DME or AE;Impaired balance (sitting and/or standing);Decreased cognition;Impaired UE functional use      OT Treatment/Interventions: Self-care/ADL training;Therapeutic exercise;Therapeutic activities;DME and/or AE instruction;Patient/family education;Balance training;Energy conservation;Cognitive remediation/compensation    OT  Goals(Current goals can be found in the care plan section) Acute Rehab OT Goals Patient Stated Goal: To be able to sit on my porch and waive at my neighbors. OT Goal Formulation: With patient Time For Goal Achievement: 07/22/19 Potential to Achieve Goals: Good ADL Goals Pt Will Perform Grooming: standing;with set-up;with supervision(With LRAD PRN for improved safety and functional independence.) Pt Will Perform Upper Body Dressing: sitting;with set-up;with supervision(With LRAD PRN for improved safety and functional independence.) Pt Will Perform Lower Body Dressing: sit to/from stand;with min guard assist;with supervision;with adaptive equipment(With LRAD PRN for improved safety and functional independence.) Additional ADL Goal #1: Pt will independently verbalize a plan to implement at least 3 learned energy conservation strategies into his daily routines/home enviornment for improved safety and functional independence upon hospital DC.  OT Frequency: Min 2X/week   Barriers to D/C: Inaccessible home environment;Decreased caregiver support          Co-evaluation              AM-PAC OT "6 Clicks" Daily Activity     Outcome Measure Help from another person eating meals?: None Help from another person taking care of personal grooming?: None Help from another person toileting, which includes using toliet, bedpan, or urinal?: A Lot Help from another person bathing (including washing, rinsing, drying)?: A Lot Help from another person to put on and taking off regular upper body clothing?: A Little Help from another person to put on and taking off regular lower body clothing?: A Lot 6 Click Score: 17   End of Session Equipment Utilized During Treatment: Gait belt Nurse Communication: Other (comment)(Pt comments on seeing "two people" in room and seeing therapist "up there".)  Activity Tolerance: Patient tolerated treatment well Patient left: in bed;with call bell/phone within reach;with  bed alarm set;Other (comment)(With hospital staff in room to see pt.)  OT Visit Diagnosis: Other abnormalities of gait and mobility (R26.89);History of falling (Z91.81);Muscle weakness (generalized) (M62.81)                Time: [id number] OT Time Calculation (min): 25 min Charges:  OT General Charges $OT Visit: 1 Visit OT Evaluation $OT Eval Moderate Complexity: 1 Mod OT Treatments $Self Care/Home Management : 8-22 mins  Shara Blazing, M.S., OTR/L Ascom: [phone] 07/08/19, 10:48 AM

## 2019-07-08 NOTE — Progress Notes (Signed)
Initial Nutrition Assessment  DOCUMENTATION CODES:   Severe malnutrition in context of chronic illness, Underweight  INTERVENTION:  Recommend liberalizing diet to carbohydrate modified.  Provide Nepro Shake po TID, each supplement provides 425 kcal and 19 grams protein.  Provide Rena-vite QHS.  NUTRITION DIAGNOSIS:   Severe Malnutrition related to chronic illness(ESRD on HD, EtOH use disorder, inadequate oral intake) as evidenced by severe fat depletion, severe muscle depletion.  GOAL:   Patient will meet greater than or equal to 90% of their needs  MONITOR:   PO intake, Supplement acceptance, Labs, Weight trends, I & O's  REASON FOR ASSESSMENT:   Consult Assessment of nutrition requirement/status  ASSESSMENT:   59 year old male with PMHx of DM, HTN, HLD, EtOH use disorder, hx CVA, ESRD on HD admitted with acute metabolic encephalopathy, hypertensive emergency, seizures, hyperosmolar hyperglycemic state.   Met with patient at bedside. He is known to this RD from previous admission in 08/2017. Patient also met criteria for severe chronic malnutrition at that time. Patient reports his appetite is decreased but he is unable to provide details of how he typically eats at home. Patient is eating well here and is finishing about 75% of his meals according to chart. He is willing to drink oral nutrition shakes to help meet calorie/protein needs.  Spoke with patient's wife over the phone. She reports that patient does not eat well at home. When she is at home she prepares meals for him and he will sometimes eat well but sometimes will not eat much at the meal. When she is at work he will not get up to prepare sandwiches/leftovers the wife leaves. She also leaves sugar-free drinks for him in the fridge but she reports he somehow gets sodas and drinks a lot of diet soda during the day.  Patient's UBW was 170 lbs. He was 171.6 lbs on 06/29/2016 and lost 45 lbs (27.4% body weight) over one  year. Since then his weight fluctuates but has remained low. He is currently 62.6 kg (138.01 lbs).  Medications reviewed and include: Novolog 0-9 units TID, potassium chloride 40 mEq Q4hrs, thiamine 100 mg daily IV, Keppra.  Labs reviewed: CBG 204-369, Potassium 3.2, Creatinine 2.92.  Discussed with RN.  NUTRITION - FOCUSED PHYSICAL EXAM:    Most Recent Value  Orbital Region  Moderate depletion  Upper Arm Region  Severe depletion  Thoracic and Lumbar Region  Severe depletion  Buccal Region  Moderate depletion  Temple Region  Severe depletion  Clavicle Bone Region  Severe depletion  Clavicle and Acromion Bone Region  Severe depletion  Scapular Bone Region  Severe depletion  Dorsal Hand  Severe depletion  Patellar Region  Severe depletion  Anterior Thigh Region  Severe depletion  Posterior Calf Region  Severe depletion  Edema (RD Assessment)  None  Hair  Reviewed  Eyes  Reviewed  Mouth  Reviewed  Skin  Reviewed  Nails  Reviewed     Diet Order:   Diet Order            Diet renal/carb modified with fluid restriction Diet-HS Snack? Nothing; Fluid restriction: 1200 mL Fluid; Room service appropriate? Yes with Assist; Fluid consistency: Thin  Diet effective now             EDUCATION NEEDS:   Education needs have been addressed  Skin:  Skin Assessment: Reviewed RN Assessment  Last BM:  07/08/2019 medium type 5  Height:   Ht Readings from Last 1 Encounters:  07/06/19 6'  2" (1.88 m)   Weight:   Wt Readings from Last 1 Encounters:  07/07/19 62.6 kg   Ideal Body Weight:  86.4 kg  BMI:  Body mass index is 17.72 kg/m.  Estimated Nutritional Needs:   Kcal:  1900-2100  Protein:  95-105 grams  Fluid:  UOP + 1 L  Jacklynn Barnacle, MS, RD, LDN Pager number available on Amion

## 2019-07-08 NOTE — Progress Notes (Signed)
  Speech Language Pathology Treatment: Dysphagia  Patient Details Name: [name] MRN: [id number] DOB: 08-10-1960 Today's Date: 07/08/2019 Time: 1210-1242 SLP Time Calculation (min) (ACUTE ONLY): 32 min  Assessment / Plan / Recommendation Clinical Impression  Pt seen for ongoing assessment of swallowing; toleration of oral diet recommended and follow through w/ general aspiration precautions recommended. Pt was seen by NSG to begin feeding himself lying reclined in bed. NSG/SLP assisted in sitting pt upright for Lunch meal. Pt could not verbalize why he chose to lie back to eat. W/ min setup, he fed himself the Lunch meal. Reviewed chart notes, labs.  Pt consumed po trials of thin liquids, purees and soft solids w/ no overt s/s of aspiration noted; no decline in O2 sats or respiratory effort during/post trials. O2 sats 97%. A mild cough was noted x1 w/ lengthy mastication/gumming/mashing of the Large solid foods trial -- pt had put too much in his mouth significantly increasing oral phase time for bolus management and clearing. No other coughing noted during foods/liquids. Oral phase c/b grossly adequate bolus management of liquids and purees; timely A-P transfer and oral clearing. W/ trials of increased texture, pt exhibited decreased awareness of need to take small bites and to search R buccal/cheek area for any pocketing. W/ a Large bolus he put in his mouth, oral phase time for management/clearing increased significantly. Pt was given verbal cues to use Finger Sweeping to clear R cheek -- good follow through w/ cues/monitoring. Pt is Edentulous which impacts effective mastication of solids. NSG present and given update on instruction/monitoring needed w/ pt d/t declined Cognitive awareness (suspect impact from old CVA and ETOH abuse).  Recommend continue w/ current diet w/ MINCED meats w/ gravies added; Thin liquids. General aspiration precautions. Monitoring during meals for follow through w/  precautions; setup. Recommend Pills in Puree - Crushed as needed. NSG to reconsult if any new needs arise while admitted. NSG agreed. Precautions posted.      HPI HPI: Pt is a 59 y.o. male with Multiple medical history and issues including ETOH abuse, GERD, Monilial esophagitis, Protein-calorie malnutrition - severe,  hypertension, end-stage renal disease on dialysis, AKI, ARF, UTIs, Falls, diabetes mellitus type 2, depression, anemia, ETOH/alcohol abuse, Confusion, history of CVA with right-sided weakness. Pt came to the ED with change in mental status possible seizures like activity. Last Dialysis unknown.       SLP Plan  All goals met       Recommendations  Diet recommendations: Dysphagia 3 (mechanical soft);Thin liquid(Minced Meats w/ gravies) Liquids provided via: Cup;Straw Medication Administration: Whole meds with puree(vs need for Crushed meds w/ puree) Supervision: Patient able to self feed;Intermittent supervision to cue for compensatory strategies Compensations: Minimize environmental distractions;Slow rate;Small sips/bites;Lingual sweep for clearance of pocketing;Multiple dry swallows after each bite/sip;Follow solids with liquid Postural Changes and/or Swallow Maneuvers: Seated upright 90 degrees;Upright 30-60 min after meal                General recommendations: (Dietician f/u) Oral Care Recommendations: Oral care BID;Oral care before and after PO;Patient independent with oral care;Staff/trained caregiver to provide oral care Follow up Recommendations: None SLP Visit Diagnosis: Dysphagia, oral phase (R13.11)(Cognitive decline baseline) Plan: All goals met       GO                 Reginald Kenner, MS, [name] [name] 07/08/2019, 12:41 PM

## 2019-07-08 NOTE — Discharge Summary (Addendum)
Physician Discharge Summary  [name] [id number] DOB: July 10, 1960 DOA: 07/05/2019  PCP: [name], MD   Addendum - patient not being discharged today.  Requires SNF placement.  See progress note of today 2/24.  ADDENDUM 2 - patient's wife returned later in the evening and picked up the patient.  She had previously indicated patient would require SNF due to not having 24 hour supervision/assistance for patient at home.   Admit date: 07/05/2019 Discharge date: 07/08/2019  Admitted From: Home Disposition:  Home  Recommendations for Outpatient Follow-up:  1. Follow up with PCP in 1-2 weeks 2. Please obtain BMP/CBC in one week 3. Please follow up with nephrology and attend dialysis as scheduled. 4. Follow up with neurology in 2-3 weeks  Home Health: Yes - resume  Equipment/Devices: none   Discharge Condition: stable  CODE STATUS: full  Diet recommendation: Renal   Brief/Interim Summary:  59 year old male with past medical history of CVA's with residual right-sided weakness, alcohol abuse, type 2 diabetes,alcohol abuse, ESRD on dialysis who presented to the emergency department with altered mental status and seizure in the setting of hyperosmolar hyperglycemic state and hypertensive emergency.  In the ED, BP was 200/134 (max reading 246/105), blood glucose >1000 without elevated anion gap.  Head CT negative for acute findings.  Patient placed on DKA/HHS protocol with [facility].  Admitted to hospitalist service with neurology and nephrology consulted.   Loaded on Keppra in the ED.  MRI brain showed multiple chronic infarcts and a new T2 hyperintensity of the right parieto-occipital area consistent with an atypical presentation of PRES.   Hypertensive emergency with encephalopathy and seizure -resolved .  Present on admission as evidenced by severely uncontrolled blood pressure, acute encephalopathy and MRI brain with findings consistent with atypical PRES. Patient has history  of poorly controlled hypertension as outpatient. --Continue home amlodipine and metoprolol --IV labetalol and/or hydralazine as needed   Seizures -provoked in the setting of hypertensive emergency and HHS --Neurology following --Continue Keppra 500 mg p.o. twice daily x2 weeks, then discontinue since this was provoked --Seizure precautions --Ativan as needed seizure activity --No driving for 6 months --Precautions per neurology: "Per [facility] statutes, patients with seizures are not allowed to drive until they have been seizure-free for six months. Use caution when using heavy equipment or power tools. Avoid working on ladders or at heights. Take showers instead of baths. Ensure the water temperature is not too high on the home water heater. Do not go swimming alone. Do not lock yourself in a room alone (i.e. bathroom). When caring for infants or small children, sit down when holding, feeding, or changing them to minimize risk of injury to the child in the event you have a seizure. Maintain good sleep hygiene. Avoid alcohol."  Hyperosmolar hyperglycemic state -present on admission, resolved.    Alcohol use disorder -continue thiamine --Strongly advised cessation of alcohol    Discharge Diagnoses: Principal Problem:   Hyperosmolar hyperglycemic state (HHS) ([place]) Active Problems:   Iron deficiency anemia   Uncontrolled diabetes mellitus (HCC)   Alcohol abuse   GERD (gastroesophageal reflux disease)   CVA (cerebral vascular accident) ([place])   ESRD on hemodialysis ([place])   History of CVA (cerebrovascular accident)   Moderate episode of recurrent major depressive disorder ([place])   Acute metabolic encephalopathy   Hypertensive emergency   Acute encephalopathy   Seizures due to metabolic disorder [facility])    Discharge Instructions   Discharge Instructions    Call MD for:  Complete by: As directed    Uncontrolled blood pressure or blood sugars. If any seizure activity,  call 911.   Call MD for:  temperature >100.4   Complete by: As directed    Diet - low sodium heart healthy   Complete by: As directed    Renal diet   Discharge instructions   Complete by: As directed    Please monitor your blood sugar closely and see your primary care doctor within one week of discharge. Write down your blood sugar readings and # units of sliding scale Humalog.  Bring this to doctors appointments.  Take Keppra twice daily for two weeks, then can stop it.  This is to prevent seizure.  The seizure you had was most likely due to your BP and blood sugar being very uncontrolled.  Neurologist does not expect you will need to remain on seizure medicine long term.   Increase activity slowly   Complete by: As directed      Allergies as of 07/08/2019      Reactions   Ferrous Gluconate Nausea And Vomiting      Medication List    TAKE these medications   amLODipine 10 MG tablet Commonly known as: NORVASC Take 1 tablet (10 mg total) by mouth daily. Start taking on: July 09, 2019 What changed:   medication strength  how much to take  when to take this   aspirin 81 MG EC tablet Take 1 tablet (81 mg total) by mouth daily.   feeding supplement (NEPRO CARB STEADY) Liqd Take 237 mLs by mouth 3 (three) times daily between meals.   folic acid 1 MG tablet Commonly known as: FOLVITE Take 1 mg by mouth daily.   hydrALAZINE 25 MG tablet Commonly known as: APRESOLINE Take 1 tablet (25 mg total) by mouth every 8 (eight) hours.   hydrocortisone 2.5 % cream Apply 1 application topically 2 (two) times daily as needed (rash).   insulin glargine 100 UNIT/ML injection Commonly known as: LANTUS Inject 0.1 mLs (10 Units total) into the skin daily.   insulin lispro 100 UNIT/ML KwikPen Commonly known as: HumaLOG KwikPen Inject 0-0.09 mLs (0-9 Units total) into the skin 3 (three) times daily with meals. According to sliding scale   levETIRAcetam 500 MG tablet Commonly  known as: Keppra Take 1 tablet (500 mg total) by mouth 2 (two) times daily for 14 days.   Melatonin 3 MG Tabs Take 1 tablet by mouth at bedtime.   metoprolol tartrate 50 MG tablet Commonly known as: LOPRESSOR Take 1 tablet (50 mg total) by mouth 2 (two) times daily. What changed:   how much to take  when to take this   multivitamin Tabs tablet Take 1 tablet by mouth at bedtime.   pantoprazole 40 MG tablet Commonly known as: PROTONIX Take by mouth.   sertraline 100 MG tablet Commonly known as: ZOLOFT Take by mouth.   thiamine 100 MG tablet Take 1 tablet (100 mg total) by mouth daily.       Allergies  Allergen Reactions  . Ferrous Gluconate Nausea And Vomiting    Consultations:  Neurology  Nephrology    Procedures/Studies: EEG  Result Date: 07/06/2019 Lora Havens, MD     07/06/2019  3:49 PM Patient Name: THADIS BRODIE MRN: [id number] Epilepsy Attending: Lora Havens Referring Physician/Provider: Dr Binnie Kand Date: 07/06/2019 Duration: 23.28 minutes Patient history: 59 year old male with past medical history of CVA with residual right-sided weakness, alcohol abuse, diabetes, CKD on dialysis  who presented to the emergency department with altered mental status and seizure in the setting of hyperosmolar hyperglycemic state.  EEG to evaluate for seizures. Level of alertness: Lethargic AEDs during EEG study: Keppra Technical aspects: This EEG study was done with scalp electrodes positioned according to the 10-20 International system of electrode placement. Electrical activity was acquired at a sampling rate of 500Hz  and reviewed with a high frequency filter of 70Hz  and a low frequency filter of 1Hz . EEG data were recorded continuously and digitally stored. Description: No clear posterior dominant rhythm was seen.  EEG showed continuous generalized 2 to 3 Hz delta slowing.  Of note, study was technically difficult due to significant myogenic and electrode artifact.  Hyperventilation and photic stimulation were not performed. Abnormality Continued slow, generalized IMPRESSION: This technically difficult study is suggestive of moderate to severe diffuse encephalopathy, nonspecific to etiology.  No seizures or epileptiform discharges were seen throughout the recording. If concern for interictal activity remains, can consider repeat study with sleep. Lora Havens   CT Head Wo Contrast  Result Date: 07/05/2019 CLINICAL DATA:  Altered mental status EXAM: CT HEAD WITHOUT CONTRAST TECHNIQUE: Contiguous axial images were obtained from the base of the skull through the vertex without intravenous contrast. COMPARISON:  CT head 06/04/2019 FINDINGS: Brain: Stable regions of encephalomalacia in the bilateral frontal lobes, insula and right caudate likely as a result of prior ischemic insult. Additional areas of remote lacunar infarct again seen in the basal ganglia. No convincing areas of acute CT evident infarction. Evaluation of the inferior temporal lobes and cerebellum are limited due to motion artifact. Patchy areas of white matter hypoattenuation are most compatible with chronic microvascular angiopathy. Symmetric prominence of the ventricles, cisterns and sulci compatible with parenchymal volume loss. Vascular: Atherosclerotic calcification of the carotid siphons and intradural vertebral arteries. No hyperdense vessel though evaluation of the skull base is limited due to motion. Skull: Motion artifact may limit detection of subtle osseous injuries. Sinuses/Orbits: Persistent chronic opacification of the left frontal sinus. No air-fluid levels. Remaining paranasal sinuses and mastoid air cells are grossly clear. Orbital structures are difficult to evaluate in the setting of motion. Other: None IMPRESSION: Imaging quality is significantly degraded by patient motion artifact particularly towards the inferior temporal lobes, skull base and posterior fossa. No definite acute  intracranial abnormality. Stable regions of encephalomalacia in the frontal lobes, insula, caudate and basal ganglia. Chronic microvascular angiopathy changes and parenchymal volume loss, similar to prior. Electronically Signed   By: Lovena Le M.D.   On: 07/05/2019 22:58   MR BRAIN WO CONTRAST  Result Date: 07/06/2019 CLINICAL DATA:  Seizure, history of stroke EXAM: MRI HEAD WITHOUT CONTRAST TECHNIQUE: Multiplanar, multiecho pulse sequences of the brain and surrounding structures were obtained without intravenous contrast. COMPARISON:  2017 FINDINGS: Motion artifact is present. Brain: There is no acute infarction or hemorrhage. Encephalomalacia is present in the left frontal lobe and insula including involvement of the precentral gyrus with associated volume loss. Smaller chronic infarcts are present in the right frontal lobe. There is a chronic small vessel infarct of the right caudate. New T2 hyperintensity in the right parieto-occipital region without diffusion abnormality appears to be primarily subcortical. Additional patchy T2 hyperintensity in the supratentorial white matter is nonspecific but probably reflects stable chronic microvascular ischemic changes. Susceptibility weighted imaging is degraded by motion. No evidence of hemorrhage. There is no intracranial mass or mass effect. Prominence of the ventricles and sulci reflects similar generalized parenchymal volume loss. Vascular: As  before, there is loss of the normal flow voids of the visualized internal carotid arteries. Skull and upper cervical spine: Normal marrow signal is preserved. Sinuses/Orbits: Chronic left frontal sinus opacification. Orbits are unremarkable. Other: Mastoid air cells are clear.  Sella is unremarkable. IMPRESSION: Suboptimal evaluation due to motion artifact. No acute infarction or hemorrhage. Multiple chronic infarcts involving bilateral cerebral hemispheres. New subcortical T2 hyperintensity in the right  parietooccipital region without definite cortical involvement. This could reflect atypical unilateral PRES. Seizure related edema secondary to occipital seizure in the setting of hyperosmolar hyperglycemic state is a possibility but imaging appearance is not convincing for this. Electronically Signed   By: Macy Mis M.D.   On: 07/06/2019 12:33   DG Hand 2 View Right  Result Date: 07/05/2019 CLINICAL DATA:  Found down, right hand swelling and weakness EXAM: RIGHT HAND - 2 VIEW COMPARISON:  None. FINDINGS: Frontal and lateral views of the right hand are obtained. There are no acute displaced fractures alignment is grossly anatomic. Joint spaces are well preserved. Mild diffuse soft tissue edema is noted. IMPRESSION: 1. Soft tissue edema.  No acute bony abnormality Electronically Signed   By: Randa Ngo M.D.   On: 07/05/2019 22:37   DG Chest Port 1 View  Result Date: 07/05/2019 CLINICAL DATA:  Altered level of consciousness, found down, end-stage renal disease EXAM: PORTABLE CHEST 1 VIEW COMPARISON:  06/05/2019 FINDINGS: Two frontal views of the chest demonstrate right-sided dialysis catheter unchanged. Cardiac silhouette is stable. There is chronic central vascular congestion, with increased interstitial prominence likely representing fluid overload. No effusion or pneumothorax. No acute bony abnormalities. IMPRESSION: 1. Vascular congestion and increased interstitial prominence consistent with fluid overload. Electronically Signed   By: Randa Ngo M.D.   On: 07/05/2019 22:38   VAS [place] UPPER EXTREMITY ARTERIAL DUPLEX  Result Date: 06/15/2019 UPPER EXTREMITY DUPLEX STUDY Performing Technologist: Almira Coaster RVS  Examination Guidelines: A complete evaluation includes B-mode imaging, spectral Doppler, color Doppler, and power Doppler as needed of all accessible portions of each vessel. Bilateral testing is considered an integral part of a complete examination. Limited examinations for  reoccurring indications may be performed as noted.  Right Pre-Dialysis Findings: +-----------------------+----------+--------------------+---------+--------+ Location               PSV (cm/s)Intralum. Diam. (cm)Waveform Comments +-----------------------+----------+--------------------+---------+--------+ Brachial Antecub. fossa98        0.45                triphasic         +-----------------------+----------+--------------------+---------+--------+ Radial Art at Wrist    66        0.15                triphasic         +-----------------------+----------+--------------------+---------+--------+ Ulnar Art at Wrist     23        19.00               triphasic         +-----------------------+----------+--------------------+---------+--------+ Left Pre-Dialysis Findings: +-----------------------+----------+--------------------+---------+--------+ Location               PSV (cm/s)Intralum. Diam. (cm)Waveform Comments +-----------------------+----------+--------------------+---------+--------+ Brachial Antecub. fossa105       0.51                triphasic         +-----------------------+----------+--------------------+---------+--------+ Radial Art at Wrist    43        0.28  triphasic         +-----------------------+----------+--------------------+---------+--------+ Ulnar Art at Wrist     80        0.13                triphasic         +-----------------------+----------+--------------------+---------+--------+  Summary:  Right: Triphasic Waveforms obtained in the Rt Brachial Artery,        Radial Artery and Ulnar Artery. Left: Triphasic Waveforms obtained in the Lt Brachial Artery, Radial       Artery and Ulnar Artery. *See table(s) above for measurements and observations. Electronically signed by Hortencia Pilar MD on 06/15/2019 at 4:43:43 PM.    Final    VAS [place] UPPER EXT VEIN MAPPING (PRE-OP AVF)  Result Date: 06/15/2019 UPPER EXTREMITY VEIN MAPPING   Indications: Pre-access. Performing Technologist: Almira Coaster RVS  Examination Guidelines: A complete evaluation includes B-mode imaging, spectral Doppler, color Doppler, and power Doppler as needed of all accessible portions of each vessel. Bilateral testing is considered an integral part of a complete examination. Limited examinations for reoccurring indications may be performed as noted. +-----------------+-------------+----------+--------+ Right Cephalic   Diameter (cm)Depth (cm)Findings +-----------------+-------------+----------+--------+ Prox upper arm       0.13                        +-----------------+-------------+----------+--------+ Mid upper arm        0.10                        +-----------------+-------------+----------+--------+ Dist upper arm       0.11                        +-----------------+-------------+----------+--------+ Antecubital fossa    0.16                        +-----------------+-------------+----------+--------+ Prox forearm         0.20                        +-----------------+-------------+----------+--------+ Mid forearm          0.14                        +-----------------+-------------+----------+--------+ Dist forearm         0.11                        +-----------------+-------------+----------+--------+ +-----------------+-------------+----------+--------+ Right Basilic    Diameter (cm)Depth (cm)Findings +-----------------+-------------+----------+--------+ Mid upper arm        0.19                        +-----------------+-------------+----------+--------+ Dist upper arm       0.15                        +-----------------+-------------+----------+--------+ Antecubital fossa    0.14                        +-----------------+-------------+----------+--------+ Prox forearm         0.12                        +-----------------+-------------+----------+--------+  +-----------------+-------------+----------+--------+ Left Cephalic    Diameter (cm)Depth (cm)Findings +-----------------+-------------+----------+--------+ Prox upper arm  0.11                        +-----------------+-------------+----------+--------+ Mid upper arm        0.12                        +-----------------+-------------+----------+--------+ Dist upper arm       0.11                        +-----------------+-------------+----------+--------+ Antecubital fossa    0.12                        +-----------------+-------------+----------+--------+ Prox forearm         0.10                        +-----------------+-------------+----------+--------+ Mid forearm          0.10                        +-----------------+-------------+----------+--------+ Dist forearm         0.17                        +-----------------+-------------+----------+--------+ +-----------------+-------------+----------+--------+ Left Basilic     Diameter (cm)Depth (cm)Findings +-----------------+-------------+----------+--------+ Mid upper arm        0.23                        +-----------------+-------------+----------+--------+ Dist upper arm       0.17                        +-----------------+-------------+----------+--------+ Antecubital fossa    0.13                        +-----------------+-------------+----------+--------+ Prox forearm         0.12                        +-----------------+-------------+----------+--------+ Summary: Right: Mapping done of the Right Basilic vein and Cephalic vein;        Vessels appear to be very small. Left: Mapping done of the Left Basilic vein and Cephalic vein;       Vessels appear to be very small. *See table(s) above for measurements and observations.  Diagnosing physician: Hortencia Pilar MD Electronically signed by Hortencia Pilar MD on 06/15/2019 at 4:43:45 PM.    Final        Subjective: Patient seen this  AM at bedside after breakfast.  Reports feeling well.  Only complaint is an intermittent cough which is stable. No fever/chills, chest pain, SOB, N/V/D or other complaints.  Asks about going home today.   Discharge Exam: Vitals:   07/08/19 1100 07/08/19 1130  BP: (!) 150/95 (!) 147/76  Pulse: 74 76  Resp: 14 20  Temp:    SpO2: 100% 100%   Vitals:   07/08/19 1000 07/08/19 1030 07/08/19 1100 07/08/19 1130  BP: (!) 160/97 (!) 164/94 (!) 150/95 (!) 147/76  Pulse: 76 76 74 76  Resp: 13 (!) 0 14 20  Temp:      TempSrc:      SpO2: 100% 100% 100% 100%  Weight:      Height:        General:  Pt is alert, awake, not in acute distress Cardiovascular: RRR, S1/S2 +, no rubs, no gallops Respiratory: CTA bilaterally, no wheezing, no rhonchi Abdominal: Soft, NT, ND, bowel sounds + Extremities: no edema, no cyanosis    The results of significant diagnostics from this hospitalization (including imaging, microbiology, ancillary and laboratory) are listed below for reference.     Microbiology: Recent Results (from the past 240 hour(s))  Respiratory Panel by RT PCR (Flu A&B, Covid) - Nasopharyngeal Swab     Status: None   Collection Time: 07/05/19 11:14 PM   Specimen: Nasopharyngeal Swab  Result Value Ref Range Status   SARS Coronavirus 2 by RT PCR NEGATIVE NEGATIVE Final    Comment: (NOTE) SARS-CoV-2 target nucleic acids are NOT DETECTED. The SARS-CoV-2 RNA is generally detectable in upper respiratoy specimens during the acute phase of infection. The lowest concentration of SARS-CoV-2 viral copies this assay can detect is 131 copies/mL. A negative result does not preclude SARS-Cov-2 infection and should not be used as the sole basis for treatment or other patient management decisions. A negative result may occur with  improper specimen collection/handling, submission of specimen other than nasopharyngeal swab, presence of viral mutation(s) within the areas targeted by this assay, and  inadequate number of viral copies (<131 copies/mL). A negative result must be combined with clinical observations, patient history, and epidemiological information. The expected result is Negative. Fact Sheet for Patients:  PinkCheek.be Fact Sheet for Healthcare Providers:  GravelBags.it This test is not yet ap proved or cleared by the [place] FDA and  has been authorized for detection and/or diagnosis of SARS-CoV-2 by FDA under an Emergency Use Authorization (EUA). This EUA will remain  in effect (meaning this test can be used) for the duration of the COVID-19 declaration under Section 564(b)(1) of the Act, 21 U.S.C. section 360bbb-3(b)(1), unless the authorization is terminated or revoked sooner.    Influenza A by PCR NEGATIVE NEGATIVE Final   Influenza B by PCR NEGATIVE NEGATIVE Final    Comment: (NOTE) The Xpert Xpress SARS-CoV-2/FLU/RSV assay is intended as an aid in  the diagnosis of influenza from Nasopharyngeal swab specimens and  should not be used as a sole basis for treatment. Nasal washings and  aspirates are unacceptable for Xpert Xpress SARS-CoV-2/FLU/RSV  testing. Fact Sheet for Patients: PinkCheek.be Fact Sheet for Healthcare Providers: GravelBags.it This test is not yet approved or cleared by the [place] FDA and  has been authorized for detection and/or diagnosis of SARS-CoV-2 by  FDA under an Emergency Use Authorization (EUA). This EUA will remain  in effect (meaning this test can be used) for the duration of the  Covid-19 declaration under Section 564(b)(1) of the Act, 21  U.S.C. section 360bbb-3(b)(1), unless the authorization is  terminated or revoked. Performed at [facility], [address] [place], [place] [address]   MRSA PCR Screening     Status: None   Collection Time: 07/06/19  1:44 AM   Specimen: Nasal Mucosa;  Nasopharyngeal  Result Value Ref Range Status   MRSA by PCR NEGATIVE NEGATIVE Final    Comment:        The GeneXpert MRSA Assay (FDA approved for NASAL specimens only), is one component of a comprehensive MRSA colonization surveillance program. It is not intended to diagnose MRSA infection nor to guide or monitor treatment for MRSA infections. Performed at [facility], [address] [place], [place] [address]      Labs: BNP (last 3 results) Recent Labs  07/05/19 2156  BNP [id number]*   Basic Metabolic Panel: Recent Labs  Lab 07/05/19 2156 07/06/19 0244 07/07/19 0441 07/08/19 0516  NA 128* 135 136 136  K 3.5 2.9* 3.0* 3.2*  CL 90* 98 100 99  CO2 27 23 26 27   GLUCOSE 1,059* 453* 112* 294*  BUN 17 19 19 14   CREATININE 3.37* 3.44* 3.99* 2.92*  CALCIUM 8.1* 8.5* 8.2* 7.8*  MG  --  1.7 1.7 1.9  PHOS  --  3.4 3.3 2.8   Liver Function Tests: Recent Labs  Lab 07/05/19 2156 07/06/19 0244 07/07/19 0441 07/08/19 0516  AST 27 30 27  34  ALT 20 18 17 18   ALKPHOS 267* 241* 200* 206*  BILITOT 0.7 0.5 0.8 1.0  PROT 6.6 6.4* 5.5* 5.4*  ALBUMIN 2.7* 2.6* 2.2* 2.2*   No results for input(s): LIPASE, AMYLASE in the last 168 hours. No results for input(s): AMMONIA in the last 168 hours. CBC: Recent Labs  Lab 07/05/19 2156 07/06/19 0244 07/07/19 0441 07/07/19 1100 07/08/19 0516  WBC 10.4 12.5* 8.7 8.4 7.3  NEUTROABS 8.9*  --   --   --   --   HGB 11.3* 9.8* 10.6* 9.8* 9.9*  HCT 34.9* 28.7* 32.4* 29.0* 30.3*  MCV 89.5 86.2 87.3 86.6 88.6  PLT 206 210 217 175 181   Cardiac Enzymes: No results for input(s): CKTOTAL, CKMB, CKMBINDEX, TROPONINI in the last 168 hours. BNP: Invalid input(s): POCBNP CBG: Recent Labs  Lab 07/07/19 1319 07/07/19 1824 07/07/19 2342 07/08/19 0948 07/08/19 1123  GLUCAP 137* 260* 204* 369* 305*   D-Dimer No results for input(s): DDIMER in the last 72 hours. Hgb A1c No results for input(s): HGBA1C in the last 72  hours. Lipid Profile No results for input(s): CHOL, HDL, LDLCALC, TRIG, CHOLHDL, LDLDIRECT in the last 72 hours. Thyroid function studies No results for input(s): TSH, T4TOTAL, T3FREE, THYROIDAB in the last 72 hours.  Invalid input(s): FREET3 Anemia work up No results for input(s): VITAMINB12, FOLATE, FERRITIN, TIBC, IRON, RETICCTPCT in the last 72 hours. Urinalysis    Component Value Date/Time   COLORURINE YELLOW (A) 07/06/2019 0144   APPEARANCEUR CLOUDY (A) 07/06/2019 0144   APPEARANCEUR Clear 01/17/2014 1551   LABSPEC 1.017 07/06/2019 0144   LABSPEC 1.011 01/17/2014 1551   PHURINE 7.0 07/06/2019 0144   GLUCOSEU >=500 (A) 07/06/2019 0144   GLUCOSEU Negative 01/17/2014 1551   HGBUR SMALL (A) 07/06/2019 0144   BILIRUBINUR NEGATIVE 07/06/2019 0144   BILIRUBINUR Negative 01/17/2014 1551   KETONESUR NEGATIVE 07/06/2019 0144   PROTEINUR >=300 (A) 07/06/2019 0144   NITRITE NEGATIVE 07/06/2019 0144   LEUKOCYTESUR NEGATIVE 07/06/2019 0144   LEUKOCYTESUR 1+ 01/17/2014 1551   Sepsis Labs Invalid input(s): PROCALCITONIN,  WBC,  LACTICIDVEN Microbiology Recent Results (from the past 240 hour(s))  Respiratory Panel by RT PCR (Flu A&B, Covid) - Nasopharyngeal Swab     Status: None   Collection Time: 07/05/19 11:14 PM   Specimen: Nasopharyngeal Swab  Result Value Ref Range Status   SARS Coronavirus 2 by RT PCR NEGATIVE NEGATIVE Final    Comment: (NOTE) SARS-CoV-2 target nucleic acids are NOT DETECTED. The SARS-CoV-2 RNA is generally detectable in upper respiratoy specimens during the acute phase of infection. The lowest concentration of SARS-CoV-2 viral copies this assay can detect is 131 copies/mL. A negative result does not preclude SARS-Cov-2 infection and should not be used as the sole basis for treatment or other patient management decisions. A negative result may occur with  improper specimen  collection/handling, submission of specimen other than nasopharyngeal swab, presence  of viral mutation(s) within the areas targeted by this assay, and inadequate number of viral copies (<131 copies/mL). A negative result must be combined with clinical observations, patient history, and epidemiological information. The expected result is Negative. Fact Sheet for Patients:  PinkCheek.be Fact Sheet for Healthcare Providers:  GravelBags.it This test is not yet ap proved or cleared by the [place] FDA and  has been authorized for detection and/or diagnosis of SARS-CoV-2 by FDA under an Emergency Use Authorization (EUA). This EUA will remain  in effect (meaning this test can be used) for the duration of the COVID-19 declaration under Section 564(b)(1) of the Act, 21 U.S.C. section 360bbb-3(b)(1), unless the authorization is terminated or revoked sooner.    Influenza A by PCR NEGATIVE NEGATIVE Final   Influenza B by PCR NEGATIVE NEGATIVE Final    Comment: (NOTE) The Xpert Xpress SARS-CoV-2/FLU/RSV assay is intended as an aid in  the diagnosis of influenza from Nasopharyngeal swab specimens and  should not be used as a sole basis for treatment. Nasal washings and  aspirates are unacceptable for Xpert Xpress SARS-CoV-2/FLU/RSV  testing. Fact Sheet for Patients: PinkCheek.be Fact Sheet for Healthcare Providers: GravelBags.it This test is not yet approved or cleared by the [place] FDA and  has been authorized for detection and/or diagnosis of SARS-CoV-2 by  FDA under an Emergency Use Authorization (EUA). This EUA will remain  in effect (meaning this test can be used) for the duration of the  Covid-19 declaration under Section 564(b)(1) of the Act, 21  U.S.C. section 360bbb-3(b)(1), unless the authorization is  terminated or revoked. Performed at [facility], [address] [place], [place] [address]   MRSA PCR Screening     Status: None    Collection Time: 07/06/19  1:44 AM   Specimen: Nasal Mucosa; Nasopharyngeal  Result Value Ref Range Status   MRSA by PCR NEGATIVE NEGATIVE Final    Comment:        The GeneXpert MRSA Assay (FDA approved for NASAL specimens only), is one component of a comprehensive MRSA colonization surveillance program. It is not intended to diagnose MRSA infection nor to guide or monitor treatment for MRSA infections. Performed at [facility], [address] [place], [place] [address]      Time coordinating discharge: Over 30 minutes  SIGNED:   Ezekiel Slocumb, DO [facility] 07/08/2019, 12:27 PM   If 7PM-7AM, please contact night-coverage [email or web address]

## 2019-07-08 NOTE — Progress Notes (Signed)
Spoke with patient's wife regarding patient being discharged today. The wife was under the impression the patient was an ICU patient instead of a step-down patient. Once it was explained to the wife, she stated she would be coming to pick him  up. Hassan Rowan, NP notified. Wife arrived to facility, made aware of discharge instructions & follow-up appointments. Patient left facility in stable condition.

## 2019-07-08 NOTE — TOC Transition Note (Signed)
Transition of Care [facility]) - CM/SW Discharge Note   Patient Details  Name: [name] MRN: [id number] Date of Birth: 1960-09-18  Transition of Care [facility]) CM/SW Contact:  Candie Chroman, LCSW Phone Number: 07/08/2019, 1:27 PM   Clinical Narrative: Patient has orders to discharge home today. [facility] is aware. OT recommending a 3-in-1. Wife is agreeable. Sent secure email to [place] representative to order. Patient's wife will pick him up. Sent secure chat to RN asking him to call her and coordinate a time for pick up. No further concerns. CSW signing off.     Final next level of care: Home w Home Health Services Barriers to Discharge: Barriers Resolved   Patient Goals and CMS Choice   CMS Medicare.gov Compare Post Acute Care list provided to:: (Wife would like patient to continue with services through [facility].) Choice offered to / list presented to : NA  Discharge Placement                Patient to be transferred to facility by: Wife will pick him up. Name of family member notified: Kirklyn Cuoco Patient and family notified of of transfer: 07/08/19  Discharge Plan and Services     Post Acute Care Choice: Resumption of Svcs/PTA Provider          DME Arranged: 3-N-1 DME Agency: [employer] Date DME Agency Contacted: 07/08/19   Representative spoke with at DME Agency: [place]: RN, PT, OT, Nurse's Aide [facility] Agency: [place] Date [facility] Agency Contacted: 07/08/19   Representative spoke with at [place]: [place] (SDOH) Interventions     Readmission Risk Interventions Readmission Risk Prevention Plan 07/07/2019 06/09/2019 06/05/2019  Transportation Screening Complete - Complete  PCP or Specialist Appt within 3-5 Days Complete Complete Complete  HRI or Home Care Consult Complete Complete Complete  Palliative Care Screening - - Not Applicable  Medication Review (RN Care Manager) Complete -  Complete  Some recent data might be hidden

## 2019-07-08 NOTE — Progress Notes (Signed)
Reason for consult: Encephalopathy  Subjective: Patient is awake, alert and oriented and sitting comfortably in the bed.  He has no visual field deficits.   ROS: negative except above  Examination  Vital signs in last 24 hours: Temp:  [97.7 F (36.5 C)-98.4 F (36.9 C)] 97.7 F (36.5 C) (02/24 1200) Pulse Rate:  [73-93] 76 (02/24 1200) Resp:  [0-24] 16 (02/24 1200) BP: (128-183)/(74-166) 156/97 (02/24 1200) SpO2:  [94 %-100 %] 100 % (02/24 1200)  General: lying in bed CVS: pulse-normal rate and rhythm RS: breathing comfortably Extremities: normal   Neuro: MS: Alert, oriented, follows commands CN: pupils equal and reactive,  EOMI, face symmetric, tongue midline, normal sensation over face, Motor: 5/5 strength in all 4 extremities Reflexes: 2+ bilaterally over patella, biceps, plantars: flexor Coordination: normal Gait: not tested  Basic Metabolic Panel: Recent Labs  Lab 07/05/19 2156 07/05/19 2156 07/06/19 0244 07/07/19 0441 07/08/19 0516  NA 128*  --  135 136 136  K 3.5  --  2.9* 3.0* 3.2*  CL 90*  --  98 100 99  CO2 27  --  23 26 27   GLUCOSE 1,059*  --  453* 112* 294*  BUN 17  --  19 19 14   CREATININE 3.37*  --  3.44* 3.99* 2.92*  CALCIUM 8.1*   < > 8.5* 8.2* 7.8*  MG  --   --  1.7 1.7 1.9  PHOS  --   --  3.4 3.3 2.8   < > = values in this interval not displayed.    CBC: Recent Labs  Lab 07/05/19 2156 07/06/19 0244 07/07/19 0441 07/07/19 1100 07/08/19 0516  WBC 10.4 12.5* 8.7 8.4 7.3  NEUTROABS 8.9*  --   --   --   --   HGB 11.3* 9.8* 10.6* 9.8* 9.9*  HCT 34.9* 28.7* 32.4* 29.0* 30.3*  MCV 89.5 86.2 87.3 86.6 88.6  PLT 206 210 217 175 181     Coagulation Studies: No results for input(s): LABPROT, INR in the last 72 hours.      ASSESSMENT AND PLAN  59 y.o.malewith past medical history significant for prior CVA with residual right-sided weakness, hypertension, hyperlipidemia, alcohol abuse, diabetes mellitus, CKD ondialysis presented to  the emergency department yesterday with altered mental statusand seizure in the setting ofhyper osmolar hyperglycemic state as well elevated HTN.  BP intially greater than [id number] systolic, has improved with medications. MRI brain shows some edema without infarct in right parietal occipital lobe concerning for PRES.  Acute metabolic encephalopathy in the setting of hyperosmolar hyperglycemic state and hypertension : resolved  Hypertensive encephalopathy with Posterior Reversible Encephalopathy Syndrome:  Seizures-provoked  Recommendations BP goal less 140/90 mmHg, improving Continue to treat electrolyteand metabolic conditions EEG showed no epileptiform discharges Continue Keppra 500mg  BID x 2 weeks and then discontinue, seizure likely provoked in the setting of PRES/HTN emergency and HHS Seizure precautions including no driving x 69months F/U B1 level, if normal d/c Thiamine, will reduce dose to 100mg  daily  PT OT evaluation  Per [facility] statutes, patients with seizures are not allowed to drive until they have been seizure-free for six months. Use caution when using heavy equipment or power tools. Avoid working on ladders or at heights. Take showers instead of baths. Ensure the water temperature is not too high on the home water heater. Do not go swimming alone. Do not lock yourself in a room alone (i.e. bathroom). When caring for infants or small children, sit down when holding, feeding,  or changing them to minimize risk of injury to the child in the event you have a seizure. Maintain good sleep hygiene. Avoid alcohol.    If ALEIX KARMEL has another seizure, call 911 and bring them back to the ED if: A. The seizure lasts longer than 5 minutes.  B. The patient doesn't wake shortly after the seizure or has new problems such as difficulty seeing, speaking or moving following the seizure C. The patient was injured during the seizure D. The  patient has a temperature over 102 F (39C) E. The patient vomited during the seizure and now is having trouble breathing   Neurology will be available as needed.  Thanks for the consult.   Karena Addison Gerhard Rappaport [facility] Pager Number [phone] For questions after 7pm please refer to AMION to reach the Neurologist on call

## 2019-07-08 NOTE — Evaluation (Addendum)
Physical Therapy Evaluation Patient Details Name: [name] MRN: [id number] DOB: 1961-01-20 Today's Date: 07/08/2019   History of Present Illness  [name] is a 22yoM who comes [facility] on 2/[facility] after being found on floor by family apparent seizure like activity. upon arrivla BG: 1059, pressures as high as 248/205. PMH: ESRD on HD TTS, DM2, multiple strokes c Rt weakness, EtOH abuse, HTN, recently here for ABLA. Pt has a RW at home and is active with [facility] for [facility] services PTA.  Clinical Impression  Pt admitted with above diagnosis. Pt currently with functional limitations due to the deficits listed below (see "PT Problem List"). Upon entry, pt in bed, awake and agreeable to participate. The pt is alert and oriented x4, but somewhat drowsy (intoxicated appearing), but pleasant, conversational, and generally a good historian. Pt is familiar to author from prior admissions, pt recognizes Chief Strategy Officer. Pt reports gradual improvement in strength at home since prior DC, but never saw HHPT. Supervision for labored bed mobility and minA for rise to standing with RW. Pt able to AMB ~127ft c RW, but has strong leftward deviation, making a straight line of progression impossible, alternating leftward drift with right correction. This deviation improves in time, but of note is a new phenomenon to Chief Strategy Officer. Pt has capacity to tolerate more ambulatory activity, but needs close supervision for safety. Pt also reporting 4 falls prior to admission. Functional mobility assessment demonstrates increased effort/time requirements, poor tolerance, and need for physical assistance, whereas the patient performed these at a higher level of independence PTA. Pt will benefit from skilled PT intervention to increase independence and safety with basic mobility in preparation for discharge to the venue listed below.       Follow Up Recommendations Home health PT;Supervision/Assistance - 24 hour;Supervision for mobility/OOB;Other  (comment)(reports was never set up with HHPT at prior DC.)    Equipment Recommendations  None recommended by PT    Recommendations for Other Services       Precautions / Restrictions Precautions Precautions: Fall Precaution Comments: High fall Restrictions Weight Bearing Restrictions: No      Mobility  Bed Mobility Overal bed mobility: Needs Assistance Bed Mobility: Supine to Sit;Sit to Supine     Supine to sit: Supervision Sit to supine: Supervision   General bed mobility comments: Pt requires assist to come to sitting at EOB and to maintain balance. Is unable to perform bed mobility without moderate assist from therapist for mgt of trunk/BLE this date.  Transfers Overall transfer level: Needs assistance Equipment used: Rolling walker (2 wheeled) Transfers: Sit to/from Stand Sit to Stand: Min assist         General transfer comment: Pt able to stand/side step at EOB given 1 person handheld assist this date. Signficant posterior lean noted. Pt returns to sitting with decreased safety awareness. Requires cueing t/o for safety.  Ambulation/Gait Ambulation/Gait assistance: Min guard Gait Distance (Feet): 159 Feet Assistive device: Rolling walker (2 wheeled) Gait Pattern/deviations: Drifts right/left     General Gait Details: heavy drifiting to the left with difficulty correcting, slight improvement over time.  Stairs            Wheelchair Mobility    Modified Rankin (Stroke Patients Only)       Balance Overall balance assessment: History of Falls;Modified Independent;Mild deficits observed, not formally tested Sitting-balance support: Feet supported;Single extremity supported Sitting balance-Leahy Scale: Good Sitting balance - Comments: sits at eOB unsupported for 2-3 minutes Postural control: Posterior lean;Left lateral lean  Standing balance support: Bilateral upper extremity supported;During functional activity Standing balance-Leahy Scale:  Poor Standing balance comment: Pt stands at EOB, takes small side-steps to re-position, requires BUE support from therapist. Posterior lean.                             Pertinent Vitals/Pain Pain Assessment: Faces Faces Pain Scale: No hurt Pain Location: Pt endorses generalized R-sided pain. Pain Descriptors / Indicators: Discomfort;Sore Pain Intervention(s): Limited activity within patient's tolerance;Repositioned;Monitored during session    [place] expects to be discharged to:: Private residence Living Arrangements: Spouse/significant other Available Help at Discharge: Family;Available PRN/intermittently Type of Home: House Home Access: Level entry     Home Layout: One level Home Equipment: Grab bars - tub/shower;Grab bars - toilet;Cane - single point;Walker - 2 wheels      Prior Function Level of Independence: Needs assistance   Gait / Transfers Assistance Needed: Pt endorses using RW in the home, however, he also endorses multiple falls since past admission (at least 3 that he can remember) generally in his living room.  ADL's / Homemaking Assistance Needed: Pt states he requires assistance for nearly all ADLs including stating "I need a nurse to feed me". Pt unable to state if he has been set up with [facility] therapy since past admission.  Comments: Pt reports independent with all ADLS/IADLs, using a SPC for ambulation prior to initial admission ~ 1 month ago.     Hand Dominance   Dominant Hand: Left    Extremity/Trunk Assessment   Upper Extremity Assessment Upper Extremity Assessment: Generalized weakness;Overall [facility] for tasks assessed    Lower Extremity Assessment Lower Extremity Assessment: Generalized weakness;Overall [facility] for tasks assessed    Cervical / Trunk Assessment Cervical / Trunk Assessment: Normal  Communication   Communication: No difficulties  Cognition Arousal/Alertness: Awake/alert(drowsy) Behavior During Therapy: WFL  for tasks assessed/performed Overall Cognitive Status: Within Functional Limits for tasks assessed                                 General Comments: Pt endorses seeing "two people up there" and states "When I close my eyes and say a little prayer to God they go away". Pt also asks this author "How did you get up there?" appears to believe Pryor Curia is floating rather than standing at his bedside. No family/caregiver present to determine baseline. This Pryor Curia utilizes therapeutic use of self t/o session to re-assure pt. Pt primary RN notified.      General Comments      Exercises Other Exercises Other Exercises: Pt educated on falls prevention strategies for home/hosptial, bed mobility techniques including log-roll technique for return to supine (pt unable to return demonstrate at time of eval), and safe transfer techniques this date. Pt would benfit from review.   Assessment/Plan    PT Assessment    PT Problem List Decreased strength;Decreased range of motion;Decreased activity tolerance;Decreased balance;Decreased mobility;Decreased cognition;Decreased coordination;Decreased safety awareness       PT Treatment Interventions Balance training;Gait training;Functional mobility training;Therapeutic activities;Therapeutic exercise;Stair training;Neuromuscular re-education;Patient/family education    PT Goals (Current goals can be found in the Care Plan section)  Acute Rehab PT Goals Patient Stated Goal: To be able to sit on my porch and wave at my neighbors. PT Goal Formulation: With patient Time For Goal Achievement: 07/08/19 Potential to Achieve Goals: Fair    Frequency Min 2X/week  Barriers to discharge Decreased caregiver support would need 24/7 assistance to return home safely    Co-evaluation               AM-PAC PT "6 Clicks" Mobility  Outcome Measure Help needed turning from your back to your side while in a flat bed without using bedrails?: A  Little Help needed moving from lying on your back to sitting on the side of a flat bed without using bedrails?: A Little Help needed moving to and from a bed to a chair (including a wheelchair)?: A Little Help needed standing up from a chair using your arms (e.g., wheelchair or bedside chair)?: A Little Help needed to walk in hospital room?: A Little Help needed climbing 3-5 steps with a railing? : A Little 6 Click Score: 18    End of Session Equipment Utilized During Treatment: Gait belt Activity Tolerance: Patient tolerated treatment well;No increased pain;Patient limited by lethargy Patient left: in bed;with bed alarm set Nurse Communication: Mobility status PT Visit Diagnosis: Unsteadiness on feet (R26.81);Other abnormalities of gait and mobility (R26.89);Difficulty in walking, not elsewhere classified (R26.2);Repeated falls (R29.6)    Time: [id number] PT Time Calculation (min) (ACUTE ONLY): 20 min   Charges:   PT Evaluation $PT Eval Low Complexity: 1 Low PT Treatments $Gait Training: 8-22 mins        12:39 PM, 07/08/19 Etta Grandchild, PT, DPT Physical Therapist - [facility]  [phone] ([place])    Tiphany Fayson C 07/08/2019, 12:34 PM

## 2019-07-08 NOTE — Progress Notes (Signed)
Patient is AO to self and situation.  He is very weak in his grip and extremities movement.  He can track with his eyes.  His lung sounds are diminished.  He has S1S2 upon auscultation.  His SBP was above 145mmHg around 0600 and labetalol was delivered SBP then returned to below 130's.  He pulses are weak.  He is very confused and during his PT therapy patient stated that he saw multiple people in the room and that he was disoriented to spatial awareness.  He had a large bowel movement this morning and was given a CHG bath with wipes.  Peri and foley care was completed.  PT ambulated with patient using front wheel walker around the unit and stated patient is balancing more than usual. Patient states at home in his living room he fell down multiple times in the past month.  He has some excoriation to his right antecubital. Covered with gauze and paper tape.  Cardiologist was at bedside for assessment and stated she will coordinate with nephrology concerning possible discharge.

## 2019-07-08 NOTE — Discharge Instructions (Signed)
Per [facility] statutes, patients with seizures are not allowed to drive until they have been seizure-free for six months. Use caution when using heavy equipment or power tools. Avoid working on ladders or at heights. Take showers instead of baths. Ensure the water temperature is not too high on the home water heater. Do not go swimming alone. Do not lock yourself in a room alone (i.e. bathroom). When caring for infants or small children, sit down when holding, feeding, or changing them to minimize risk of injury to the child in the event you have a seizure. Maintain good sleep hygiene. Avoid alcohol.   If [name] has another seizure, call 911 and bring them back to the ED if: A. The seizure lasts longer than 5 minutes.  B. The patient doesn't wake shortly after the seizure or has new problems such as difficulty seeing, speaking or moving following the seizure C. The patient was injured during the seizure D. The patient has a temperature over 102 F (39C) E. The patient vomited during the seizure and now is having trouble breathing

## 2019-07-08 NOTE — Progress Notes (Addendum)
PROGRESS NOTE    [name]  [id number] DOB: [birth year] DOA: 07/05/2019  PCP: [name], MD    LOS - 2   Brief Narrative:  59 year old male with past medical history of CVA'swith residual right-sided weakness, alcohol abuse, type 2diabetes,alcohol abuse, ESRDon dialysis who presented to the emergency department with altered mental status and seizure in the setting of hyperosmolar hyperglycemic stateand hypertensive emergency.In the ED, BP was 200/134 (max reading 246/105), blood glucose >1000 without elevated anion gap. Head CT negative for acute findings. Patient placed on DKA/HHS protocol with [facility]. Admitted to hospitalist service with neurology and nephrology consulted. Loaded on Keppra in the ED. MRI brain showed multiple chronic infarcts and a new T2 hyperintensity of the right parieto-occipital area consistent with an atypical presentation of PRES.  Subjective 2/24: Patient seen this AM at bedside after breakfast.  Reports feeling well.  Only complaint is an intermittent cough which is stable. No fever/chills, chest pain, SOB, N/V/D or other complaints.  Asks about going home today.  Assessment & Plan:   Principal Problem:   Hyperosmolar hyperglycemic state (HHS) ([place]) Active Problems:   Iron deficiency anemia   Uncontrolled diabetes mellitus (HCC)   Alcohol abuse   GERD (gastroesophageal reflux disease)   CVA (cerebral vascular accident) ([place])   ESRD on hemodialysis ([place])   History of CVA (cerebrovascular accident)   Moderate episode of recurrent major depressive disorder ([place])   Acute metabolic encephalopathy   Hypertensive emergency   Acute encephalopathy   Seizures due to metabolic disorder [facility])   Hypertensive emergency with encephalopathy and seizure -resolved.Present on admission as evidenced by severely uncontrolled blood pressure, acute encephalopathy and MRI brain with findings consistent with atypical PRES. Patient has history of  poorly controlled hypertension as outpatient. --Continue home amlodipine and metoprolol --IV labetalol and/or hydralazine as needed --added hydralazine 25 mg PO TID as BP remained uncontrolled   Seizures-provoked in the setting of hypertensive emergency and HHS --Neurology following --Continue Keppra 500 mg p.o. twice daily x2 weeks, then discontinue since this was provoked --Seizure precautions --Ativan as needed seizure activity --No driving for 6 months --Precautions per neurology: "Per [facility] statutes, patients with seizures are not allowed to drive until they have been seizure-free for six months. Use caution when using heavy equipment or power tools. Avoid working on ladders or at heights. Take showers instead of baths. Ensure the water temperature is not too high on the home water heater. Do not go swimming alone. Do not lock yourself in a room alone (i.e. bathroom). When caring for infants or small children, sit down when holding, feeding, or changing them to minimize risk of injury to the child in the event you have a seizure. Maintain good sleep hygiene. Avoid alcohol."  Hyperosmolar hyperglycemic state-present on admission, resolved.   Uncontrolled Type 2 Diabetes - Hbg A1c 12.3% in Jan 2021 --Lantus 15 units qHS --Novolog 7 units TID WC --sensitive sliding scale --adjust insulin for goal 140-180 while inpatient  Alcohol use disorder-continue thiamine --Strongly advised cessation of alcohol   DVT prophylaxis: heparin   Code Status: Full Code  Family Communication: wife updated by phone this afternoon.  She states they do not have anyone that can stay with patient at home while she is at work.  Requests SNF placement.   Disposition Plan:  To SNF pending bed Coming From [place] Date 2/25  Barriers SNF placement Medically Stable for Discharge? Yes   Consultants:   Neurology  Nephrology  Procedures:   Dialysis  Antimicrobials:   None     Objective: Vitals:   07/08/19 1200 07/08/19 1300 07/08/19 1400 07/08/19 1500  BP: (!) 156/97 (!) 145/84 (!) 146/94 (!) 157/102  Pulse: 76 69 69 73  Resp: 16 10 18 14   Temp: 97.7 F (36.5 C)     TempSrc: Oral     SpO2: 100% 99% 100% 98%  Weight:      Height:        Intake/Output Summary (Last 24 hours) at 07/08/2019 1633 Last data filed at 07/08/2019 1220 Gross per 24 hour  Intake 410 ml  Output 300 ml  Net 110 ml   Filed Weights   07/05/19 2131 07/06/19 0138 07/07/19 0930  Weight: 64.4 kg 62.6 kg 62.6 kg    Examination:  General exam: awake, alert, no acute distress Respiratory system: CTAB, no wheezes, rales or rhonchi, normal respiratory effort. Cardiovascular system: normal S1/S2, RRR, no pedal edema.   Central nervous system: A&O x3. no gross focal neurologic deficits, normal speech Extremities: moves all, no edema, normal tone Skin: dry, intact, normal temperature Psychiatry: normal mood, congruent affect    Data Reviewed: I have personally reviewed following labs and imaging studies  CBC: Recent Labs  Lab 07/05/19 2156 07/06/19 0244 07/07/19 0441 07/07/19 1100 07/08/19 0516  WBC 10.4 12.5* 8.7 8.4 7.3  NEUTROABS 8.9*  --   --   --   --   HGB 11.3* 9.8* 10.6* 9.8* 9.9*  HCT 34.9* 28.7* 32.4* 29.0* 30.3*  MCV 89.5 86.2 87.3 86.6 88.6  PLT 206 210 217 175 0000000   Basic Metabolic Panel: Recent Labs  Lab 07/05/19 2156 07/06/19 0244 07/07/19 0441 07/08/19 0516  NA 128* 135 136 136  K 3.5 2.9* 3.0* 3.2*  CL 90* 98 100 99  CO2 27 23 26 27   GLUCOSE 1,059* 453* 112* 294*  BUN 17 19 19 14   CREATININE 3.37* 3.44* 3.99* 2.92*  CALCIUM 8.1* 8.5* 8.2* 7.8*  MG  --  1.7 1.7 1.9  PHOS  --  3.4 3.3 2.8   GFR: Estimated Creatinine Clearance: 24.1 mL/min (A) (by C-G formula based on SCr of 2.92 mg/dL (H)). Liver Function Tests: Recent Labs  Lab 07/05/19 2156 07/06/19 0244 07/07/19 0441 07/08/19 0516  AST 27 30 27  34  ALT 20 18 17 18   ALKPHOS  267* 241* 200* 206*  BILITOT 0.7 0.5 0.8 1.0  PROT 6.6 6.4* 5.5* 5.4*  ALBUMIN 2.7* 2.6* 2.2* 2.2*   No results for input(s): LIPASE, AMYLASE in the last 168 hours. No results for input(s): AMMONIA in the last 168 hours. Coagulation Profile: No results for input(s): INR, PROTIME in the last 168 hours. Cardiac Enzymes: No results for input(s): CKTOTAL, CKMB, CKMBINDEX, TROPONINI in the last 168 hours. BNP (last 3 results) No results for input(s): PROBNP in the last 8760 hours. HbA1C: No results for input(s): HGBA1C in the last 72 hours. CBG: Recent Labs  Lab 07/07/19 1824 07/07/19 2342 07/08/19 0948 07/08/19 1123 07/08/19 1534  GLUCAP 260* 204* 369* 305* 169*   Lipid Profile: No results for input(s): CHOL, HDL, LDLCALC, TRIG, CHOLHDL, LDLDIRECT in the last 72 hours. Thyroid Function Tests: No results for input(s): TSH, T4TOTAL, FREET4, T3FREE, THYROIDAB in the last 72 hours. Anemia Panel: No results for input(s): VITAMINB12, FOLATE, FERRITIN, TIBC, IRON, RETICCTPCT in the last 72 hours. Sepsis Labs: No results for input(s): PROCALCITON, LATICACIDVEN in the last 168 hours.  Recent Results (from the past 240 hour(s))  Respiratory Panel by RT PCR (Flu A&B, Covid) - Nasopharyngeal Swab     Status: None   Collection Time: 07/05/19 11:14 PM   Specimen: Nasopharyngeal Swab  Result Value Ref Range Status   SARS Coronavirus 2 by RT PCR NEGATIVE NEGATIVE Final    Comment: (NOTE) SARS-CoV-2 target nucleic acids are NOT DETECTED. The SARS-CoV-2 RNA is generally detectable in upper respiratoy specimens during the acute phase of infection. The lowest concentration of SARS-CoV-2 viral copies this assay can detect is 131 copies/mL. A negative result does not preclude SARS-Cov-2 infection and should not be used as the sole basis for treatment or other patient management decisions. A negative result may occur with  improper specimen collection/handling, submission of specimen other than  nasopharyngeal swab, presence of viral mutation(s) within the areas targeted by this assay, and inadequate number of viral copies (<131 copies/mL). A negative result must be combined with clinical observations, patient history, and epidemiological information. The expected result is Negative. Fact Sheet for Patients:  PinkCheek.be Fact Sheet for Healthcare Providers:  GravelBags.it This test is not yet ap proved or cleared by the [place] FDA and  has been authorized for detection and/or diagnosis of SARS-CoV-2 by FDA under an Emergency Use Authorization (EUA). This EUA will remain  in effect (meaning this test can be used) for the duration of the COVID-19 declaration under Section 564(b)(1) of the Act, 21 U.S.C. section 360bbb-3(b)(1), unless the authorization is terminated or revoked sooner.    Influenza A by PCR NEGATIVE NEGATIVE Final   Influenza B by PCR NEGATIVE NEGATIVE Final    Comment: (NOTE) The Xpert Xpress SARS-CoV-2/FLU/RSV assay is intended as an aid in  the diagnosis of influenza from Nasopharyngeal swab specimens and  should not be used as a sole basis for treatment. Nasal washings and  aspirates are unacceptable for Xpert Xpress SARS-CoV-2/FLU/RSV  testing. Fact Sheet for Patients: PinkCheek.be Fact Sheet for Healthcare Providers: GravelBags.it This test is not yet approved or cleared by the [place] FDA and  has been authorized for detection and/or diagnosis of SARS-CoV-2 by  FDA under an Emergency Use Authorization (EUA). This EUA will remain  in effect (meaning this test can be used) for the duration of the  Covid-19 declaration under Section 564(b)(1) of the Act, 21  U.S.C. section 360bbb-3(b)(1), unless the authorization is  terminated or revoked. Performed at [facility], [address] [place], [place] [address]   MRSA  PCR Screening     Status: None   Collection Time: 07/06/19  1:44 AM   Specimen: Nasal Mucosa; Nasopharyngeal  Result Value Ref Range Status   MRSA by PCR NEGATIVE NEGATIVE Final    Comment:        The GeneXpert MRSA Assay (FDA approved for NASAL specimens only), is one component of a comprehensive MRSA colonization surveillance program. It is not intended to diagnose MRSA infection nor to guide or monitor treatment for MRSA infections. Performed at [facility], [address], [place], [place] [address]          Radiology Studies: No results found.      Scheduled Meds: . amLODipine  10 mg Oral Daily  . Chlorhexidine Gluconate Cloth  6 each Topical Daily  . feeding supplement (NEPRO CARB STEADY)  237 mL Oral TID BM  . heparin  5,000 Units Subcutaneous Q8H  . hydrALAZINE  25 mg Oral Q8H  . insulin aspart  0-9 Units Subcutaneous TID WC  . metoprolol tartrate  50  mg Oral BID  . multivitamin  1 tablet Oral QHS  . sodium chloride flush  3 mL Intravenous Q12H  . sodium chloride flush  3 mL Intravenous Q12H  . thiamine injection  100 mg Intravenous Daily   Continuous Infusions: . sodium chloride    . levETIRAcetam 500 mg (07/08/19 1558)     LOS: 2 days    Time spent: 35 minutes    Ezekiel Slocumb, DO [facility]   If 7PM-7AM, please contact night-coverage [email or web address] 07/08/2019, 4:33 PM

## 2019-07-08 NOTE — Progress Notes (Signed)
Inpatient Diabetes Program Recommendations  AACE/ADA: New Consensus Statement on Inpatient Glycemic Control (2015)  Target Ranges:  Prepandial:   less than 140 mg/dL      Peak postprandial:   less than 180 mg/dL (1-2 hours)      Critically ill patients:  140 - 180 mg/dL   Lab Results  Component Value Date   GLUCAP 369 (H) 07/08/2019   HGBA1C 12.3 (H) 05/28/2019    Review of Glycemic Control  Diabetes history: DM 2 Outpatient Diabetes medications: Lantus 10 units Current orders for Inpatient glycemic control:  Novolog 0-9 units tid   Inpatient Diabetes Program Recommendations:    Consider adding Lantus 5 units (1/2 of home dose).    Pt working with OT when I arrived. Pt didn't know what she was standing on not realizing she was standing on the floor. Pt also seeing people who are not there in the room. According to pt's wife whom I spoke with after I visited pt, said pt would get confused like that at home as well. Pt having difficulty sleeping at night due to sleeping during the day and would get confused.  Pt reports his wife helps him with his medications at home. Pt reports glucose checks at least once a day and would usually run in the 200 range. Got permission to speak with wife.  Spoke with pt's wife over the phone. Wife reports ever since pt started HD he glucose has been >700. She mentioned he has been on Lantus since the last visit to the hospital (2weeks ago) and his glucose has still been 200-300 and would sometimes read high. She gives his insulin everyday (Maybe missed 2 doses). Wife reports pt has other family members and friends that brings junk food and snack and regular sodas to him when she is at work. She tries to have healthy meals for him but he will not stick to the things she has prepared.  Discussed with wife when to call their doctor for titration of insulin doses.  Wife is concerned over weight loss this admission and would like a dietitian to see pt to  evaluate. Consult placed.  At time of d/c pt needs:  Insulin pen needles Order # [id number] Lancets   Order # [id number]  Thanks,  Tama Headings RN, MSN, BC-ADM Inpatient Diabetes Coordinator Team Pager [phone] (8a-5p)

## 2019-07-10 ENCOUNTER — Telehealth (INDEPENDENT_AMBULATORY_CARE_PROVIDER_SITE_OTHER): Payer: Self-pay

## 2019-07-10 LAB — VITAMIN B1: Vitamin B1 (Thiamine): 122.7 nmol/L (ref 66.5–200.0)

## 2019-07-10 NOTE — Telephone Encounter (Signed)
Spoke with the patient's sister and we have a tentative date of 07/22/19 for the patient's surgery. She stated she would speak with the patient and call me back.

## 2019-07-13 ENCOUNTER — Encounter (INDEPENDENT_AMBULATORY_CARE_PROVIDER_SITE_OTHER): Payer: Self-pay

## 2019-07-13 ENCOUNTER — Other Ambulatory Visit: Payer: Self-pay | Admitting: Internal Medicine

## 2019-07-13 DIAGNOSIS — R7989 Other specified abnormal findings of blood chemistry: Secondary | ICD-10-CM

## 2019-07-13 NOTE — Telephone Encounter (Signed)
I attempted to contact the patient's sister Rise Paganini and a message was left for return call. Patient is scheduled with Dr. Delana Meyer for a right brachial axillary graft on 07/22/19. Patient will do a phone pre-op on 07/15/19 between 1-5 pm and covid testing on 07/20/19 between 12:30-2:30 pm at the [facility]. Pre-surgical information will be mailed to the patient.

## 2019-07-15 ENCOUNTER — Encounter
Admission: RE | Admit: 2019-07-15 | Discharge: 2019-07-15 | Disposition: A | Discharge status: Home / Self Care | Payer: Medicare Other | Source: Ambulatory Visit | Attending: Vascular Surgery | Admitting: Vascular Surgery

## 2019-07-15 ENCOUNTER — Other Ambulatory Visit: Payer: Self-pay

## 2019-07-15 ENCOUNTER — Other Ambulatory Visit (INDEPENDENT_AMBULATORY_CARE_PROVIDER_SITE_OTHER): Payer: Self-pay | Admitting: Nurse Practitioner

## 2019-07-15 HISTORY — DX: Gastro-esophageal reflux disease without esophagitis: K21.9

## 2019-07-15 NOTE — Patient Instructions (Signed)
Your procedure is scheduled on: Wednesday 07/22/19.  Report to DAY SURGERY DEPARTMENT LOCATED ON 2ND FLOOR MEDICAL MALL ENTRANCE. To find out your arrival time please call [phone] between 1PM - 3PM on Tuesday 07/21/19.   Remember: Instructions that are not followed completely may result in serious medical risk, up to and including death, or upon the discretion of your surgeon and anesthesiologist your surgery may need to be rescheduled.      _X__ 1. Do not eat food after midnight the night before your procedure.                 No gum chewing or hard candies. You may drink SUGAR FREE clear liquids such as water or Gatorade ZERO up to 2 hours                 before you are scheduled to arrive for your surgery- DO NOT drink clear                 liquids within 2 hours of the start of your surgery.                   __X__2.  On the morning of surgery brush your teeth with toothpaste and water, you may rinse your mouth with mouthwash if you wish.  Do not swallow any toothpaste or mouthwash.       _X__ 3.  No Alcohol for 24 hours before or after surgery.     _X__ 4.  Do Not Smoke or use e-cigarettes For 24 Hours Prior to Your Surgery.                 Do not use any chewable tobacco products for at least 6 hours prior to                 surgery.   __X__5.  Notify your doctor if there is any change in your medical condition      (cold, fever, infections).      Do not wear jewelry, make-up, hairpins, clips or nail polish. Do not wear lotions, powders, or perfumes.  Do not shave 48 hours prior to surgery. Men may shave face and neck. Do not bring valuables to the hospital.    [facility] is not responsible for any belongings or valuables.   Contacts, dentures/partials or body piercings may not be worn into surgery. Bring a case for your contacts, glasses or hearing aids, a denture cup will be supplied.    Patients discharged the day of surgery will not be allowed to drive  home.     __X__ Take these medicines the morning of surgery with A SIP OF WATER:   IF ANY OF THESE MEDICATIONS ARE USUALLY TAKEN AT NIGHT PLEASE DO NOT REPEAT THE DOSE ON THE MORNING OF SURGERY    1. amLODipine (NORVASC)   2. ARIPiprazole (ABILIFY)   3. hydrALAZINE (APRESOLINE)  4. levETIRAcetam (KEPPRA)  5. metoprolol tartrate (LOPRESSOR)  6. pantoprazole (PROTONIX)   7. sertraline (ZOLOFT)    __X__ Use CHG SAGE wipes as directed    __X__ Take 1/2 of usual insulin dose the night before surgery. No insulin the morning          of surgery.   __X__ Please continue to take Aspirin daily until the day of surgery.   __X__ Stop Anti-inflammatories 7 days before surgery such as Advil, Ibuprofen, Motrin, BC or Goodies Powder, Naprosyn, Naproxen, Aleve, Meloxicam. May take Tylenol if  needed for pain or discomfort.    __X__ Don't start taking any new herbal supplements prior to your procedure.

## 2019-07-17 ENCOUNTER — Ambulatory Visit
Admission: RE | Admit: 2019-07-17 | Discharge: 2019-07-17 | Disposition: A | Discharge status: Home / Self Care | Payer: Medicare Other | Source: Ambulatory Visit | Attending: Internal Medicine | Admitting: Internal Medicine

## 2019-07-17 ENCOUNTER — Encounter: Payer: Self-pay | Admitting: Anesthesiology

## 2019-07-17 ENCOUNTER — Other Ambulatory Visit: Payer: Self-pay

## 2019-07-17 ENCOUNTER — Encounter
Admission: RE | Admit: 2019-07-17 | Discharge: 2019-07-17 | Disposition: A | Discharge status: Home / Self Care | Payer: Medicare Other | Source: Ambulatory Visit | Attending: Vascular Surgery | Admitting: Vascular Surgery

## 2019-07-17 DIAGNOSIS — K8689 Other specified diseases of pancreas: Secondary | ICD-10-CM | POA: Insufficient documentation

## 2019-07-17 DIAGNOSIS — R9431 Abnormal electrocardiogram [ECG] [EKG]: Secondary | ICD-10-CM | POA: Diagnosis not present

## 2019-07-17 DIAGNOSIS — I700 Atherosclerosis of aorta: Secondary | ICD-10-CM | POA: Insufficient documentation

## 2019-07-17 DIAGNOSIS — Z01818 Encounter for other preprocedural examination: Secondary | ICD-10-CM | POA: Insufficient documentation

## 2019-07-17 DIAGNOSIS — R7989 Other specified abnormal findings of blood chemistry: Secondary | ICD-10-CM | POA: Insufficient documentation

## 2019-07-17 DIAGNOSIS — J90 Pleural effusion, not elsewhere classified: Secondary | ICD-10-CM | POA: Insufficient documentation

## 2019-07-17 DIAGNOSIS — I517 Cardiomegaly: Secondary | ICD-10-CM | POA: Diagnosis not present

## 2019-07-17 LAB — BASIC METABOLIC PANEL
Anion gap: 9 (ref 5–15)
BUN: 22 mg/dL — ABNORMAL HIGH (ref 6–20)
CO2: 26 mmol/L (ref 22–32)
Calcium: 8.4 mg/dL — ABNORMAL LOW (ref 8.9–10.3)
Chloride: 98 mmol/L (ref 98–111)
Creatinine, Ser: 2.88 mg/dL — ABNORMAL HIGH (ref 0.61–1.24)
GFR calc Af Amer: 26 mL/min — ABNORMAL LOW (ref >60)
GFR calc non Af Amer: 23 mL/min — ABNORMAL LOW (ref >60)
Glucose, Bld: 355 mg/dL — ABNORMAL HIGH (ref 70–99)
Potassium: 4.6 mmol/L (ref 3.5–5.1)
Sodium: 133 mmol/L — ABNORMAL LOW (ref 135–145)

## 2019-07-17 LAB — CBC WITH DIFFERENTIAL/PLATELET
Abs Immature Granulocytes: 0.03 10*3/uL (ref 0.00–0.07)
Basophils Absolute: 0.1 10*3/uL (ref 0.0–0.1)
Basophils Relative: 1 %
Eosinophils Absolute: 0.2 10*3/uL (ref 0.0–0.5)
Eosinophils Relative: 2 %
HCT: 27.4 % — ABNORMAL LOW (ref 39.0–52.0)
Hemoglobin: 8.8 g/dL — ABNORMAL LOW (ref 13.0–17.0)
Immature Granulocytes: 0 %
Lymphocytes Relative: 23 %
Lymphs Abs: 1.9 10*3/uL (ref 0.7–4.0)
MCH: 29.8 pg (ref 26.0–34.0)
MCHC: 32.1 g/dL (ref 30.0–36.0)
MCV: 92.9 fL (ref 80.0–100.0)
Monocytes Absolute: 0.9 10*3/uL (ref 0.1–1.0)
Monocytes Relative: 11 %
Neutro Abs: 5.2 10*3/uL (ref 1.7–7.7)
Neutrophils Relative %: 63 %
Platelets: 258 10*3/uL (ref 150–400)
RBC: 2.95 MIL/uL — ABNORMAL LOW (ref 4.22–5.81)
RDW: 15.4 % (ref 11.5–15.5)
WBC: 8.1 10*3/uL (ref 4.0–10.5)
nRBC: 0 % (ref 0.0–0.2)

## 2019-07-17 LAB — APTT: aPTT: 37 seconds — ABNORMAL HIGH (ref 24–36)

## 2019-07-17 LAB — PROTIME-INR
INR: 1 (ref 0.8–1.2)
Prothrombin Time: 13.3 seconds (ref 11.4–15.2)

## 2019-07-20 ENCOUNTER — Other Ambulatory Visit
Admission: RE | Admit: 2019-07-20 | Payer: Medicare Other | Source: Ambulatory Visit | Attending: Neurology | Admitting: Neurology

## 2019-07-21 ENCOUNTER — Telehealth (INDEPENDENT_AMBULATORY_CARE_PROVIDER_SITE_OTHER): Payer: Self-pay

## 2019-07-21 ENCOUNTER — Encounter: Payer: Self-pay | Admitting: Gastroenterology

## 2019-07-21 ENCOUNTER — Other Ambulatory Visit: Payer: Self-pay

## 2019-07-21 ENCOUNTER — Ambulatory Visit (INDEPENDENT_AMBULATORY_CARE_PROVIDER_SITE_OTHER): Payer: Medicare Other | Admitting: Gastroenterology

## 2019-07-21 VITALS — BP 165/79 | HR 83 | Ht 71.0 in | Wt 145.0 lb

## 2019-07-21 DIAGNOSIS — R197 Diarrhea, unspecified: Secondary | ICD-10-CM | POA: Diagnosis not present

## 2019-07-21 DIAGNOSIS — N186 End stage renal disease: Secondary | ICD-10-CM | POA: Insufficient documentation

## 2019-07-21 DIAGNOSIS — Z992 Dependence on renal dialysis: Secondary | ICD-10-CM | POA: Insufficient documentation

## 2019-07-21 LAB — TYPE AND SCREEN
ABO/RH(D): A POS
Antibody Screen: NEGATIVE
Extend sample reason: TRANSFUSED

## 2019-07-21 NOTE — Telephone Encounter (Signed)
A call was received from pre-admission stating the patient had an abnormal EKG and it was sent to his PCP. Per Caryl Pina the PCP would not clear the patient for surgery (07/22/19), the patient would need to see a cardiologist. I spoke with the patient's sister and let her know what was going on and then called [facility] and spoke with the receptionist and she will call the patient's sister and try and get him scheduled for his surgery on 07/24/19 with Dr. Clayborn Bigness. The patient's surgery has been canceled for now.

## 2019-07-21 NOTE — Progress Notes (Signed)
Primary Care Physician: [name], MD  Primary Gastroenterologist:  Dr. Lucilla Lame  Chief Complaint  Patient presents with  . Rectal Bleeding    HPI: [name] is a 59 y.o. male here for diarrhea. The patient had a colonoscopy with 6 polyps and 2 were reported to have dysplasia. He now comes with his sister who reports that the patient has had diarrhea for the last year with nocturnal soiling. The patient does have diary in his diet. He does not know what makes the diarrhea better or worse. There is no weight loss and no blood in his stool.   Past Medical History:  Diagnosis Date  . Chronic kidney disease   . Diabetes mellitus without complication ([place])   . ETOH abuse   . GERD (gastroesophageal reflux disease)   . Hyperlipidemia   . Hypertension   . Stroke [facility])     Current Outpatient Medications  Medication Sig Dispense Refill  . amLODipine (NORVASC) 10 MG tablet Take 1 tablet (10 mg total) by mouth daily. 30 tablet 1  . ARIPiprazole (ABILIFY) 2 MG tablet Take 2 mg by mouth daily.    [name] aspirin EC 81 MG EC tablet Take 1 tablet (81 mg total) by mouth daily.    . folic acid (FOLVITE) 1 MG tablet Take 1 mg by mouth daily.    . hydrALAZINE (APRESOLINE) 25 MG tablet Take 1 tablet (25 mg total) by mouth every 8 (eight) hours. 90 tablet 1  . hydrocortisone 2.5 % cream Apply 1 application topically 2 (two) times daily as needed (rash).     . insulin glargine (LANTUS) 100 UNIT/ML injection Inject 0.1 mLs (10 Units total) into the skin daily. 10 mL 11  . insulin lispro (HUMALOG KWIKPEN) 100 UNIT/ML KwikPen Inject 0-0.09 mLs (0-9 Units total) into the skin 3 (three) times daily with meals. According to sliding scale 15 mL 11  . levETIRAcetam (KEPPRA) 500 MG tablet Take 1 tablet (500 mg total) by mouth 2 (two) times daily for 14 days. 28 tablet 0  . Melatonin 3 MG TABS Take 1 tablet by mouth at bedtime.    . metoprolol tartrate (LOPRESSOR) 50 MG tablet Take 1 tablet (50 mg  total) by mouth 2 (two) times daily. 60 tablet 1  . multivitamin (RENA-VIT) TABS tablet Take 1 tablet by mouth at bedtime. 30 tablet 1  . Nutritional Supplements (FEEDING SUPPLEMENT, NEPRO CARB STEADY,) LIQD Take 237 mLs by mouth 3 (three) times daily between meals.  0  . pantoprazole (PROTONIX) 40 MG tablet Take by mouth.    . sertraline (ZOLOFT) 100 MG tablet Take by mouth.    . thiamine 100 MG tablet Take 1 tablet (100 mg total) by mouth daily. 30 tablet 1   No current facility-administered medications for this visit.    Allergies as of 07/21/2019 - Review Complete 07/21/2019  Allergen Reaction Noted  . Ferrous gluconate Nausea And Vomiting 02/17/2014    ROS:  General: Negative for anorexia, weight loss, fever, chills, fatigue, weakness. ENT: Negative for hoarseness, difficulty swallowing , nasal congestion. CV: Negative for chest pain, angina, palpitations, dyspnea on exertion, peripheral edema.  Respiratory: Negative for dyspnea at rest, dyspnea on exertion, cough, sputum, wheezing.  GI: See history of present illness. GU:  Negative for dysuria, hematuria, urinary incontinence, urinary frequency, nocturnal urination.  Endo: Negative for unusual weight change.    Physical Examination:   BP (!) 165/79   Pulse 83   Ht 5\' 11"  (1.803  m)   Wt 145 lb (65.8 kg)   BMI 20.22 kg/m   General: Well-nourished, well-developed in no acute distress.  Eyes: No icterus. Conjunctivae pink. Neuro: Alert and oriented x 3.  Grossly intact. Skin: Warm and dry, no jaundice.   Psych: Alert and cooperative, normal mood and affect.  Labs:    Imaging Studies: EEG  Result Date: 07/06/2019 Lora Havens, MD     07/06/2019  3:49 PM Patient Name: [name] MRN: [id number] Epilepsy Attending: Lora Havens Referring Physician/Provider: Dr Binnie Kand Date: 07/06/2019 Duration: 23.28 minutes Patient history: 59 year old male with past medical history of CVA with residual right-sided  weakness, alcohol abuse, diabetes, CKD on dialysis who presented to the emergency department with altered mental status and seizure in the setting of hyperosmolar hyperglycemic state.  EEG to evaluate for seizures. Level of alertness: Lethargic AEDs during EEG study: Keppra Technical aspects: This EEG study was done with scalp electrodes positioned according to the 10-20 International system of electrode placement. Electrical activity was acquired at a sampling rate of 500Hz  and reviewed with a high frequency filter of 70Hz  and a low frequency filter of 1Hz . EEG data were recorded continuously and digitally stored. Description: No clear posterior dominant rhythm was seen.  EEG showed continuous generalized 2 to 3 Hz delta slowing.  Of note, study was technically difficult due to significant myogenic and electrode artifact. Hyperventilation and photic stimulation were not performed. Abnormality Continued slow, generalized IMPRESSION: This technically difficult study is suggestive of moderate to severe diffuse encephalopathy, nonspecific to etiology.  No seizures or epileptiform discharges were seen throughout the recording. If concern for interictal activity remains, can consider repeat study with sleep. Priyanka [name]   CT ABDOMEN PELVIS WO CONTRAST  Result Date: 07/17/2019 CLINICAL DATA:  Elevated LFTs, mid abdominal pain with nausea and diarrhea EXAM: CT ABDOMEN AND PELVIS WITHOUT CONTRAST TECHNIQUE: Multidetector CT imaging of the abdomen and pelvis was performed following the standard protocol without IV contrast. COMPARISON:  CT 05/27/2019 FINDINGS: Lower chest: Extensive interlobular septal thickening noted in the lung bases with small pleural effusions and adjacent areas of passive atelectasis. There is cardiomegaly with trace pericardial fluid as well. Hypoattenuation of the cardiac blood pool relative to the myocardium is typically compatible with anemia on this non contrast CT study Hepatobiliary: No  focal liver abnormality is seen. No gallstones, gallbladder wall thickening, or biliary dilatation. Pancreas: Diffuse pancreatic calcifications without focal peripancreatic inflammation or ductal dilatation. Spleen: Normal in size without focal abnormality. Adrenals/Urinary Tract: Normal adrenal glands. Stable mild bilateral symmetric perinephric stranding, a nonspecific finding though may correlate with either age or decreased renal function. Kidneys are otherwise unremarkable, without renal calculi, suspicious lesion, or hydronephrosis. Urinary bladder is largely decompressed at the time of exam and therefore poorly evaluated by CT imaging. Stomach/Bowel: Distal esophagus, stomach and duodenum are unremarkable. No small bowel dilatation or wall thickening. Enteric contrast media traverses part way through the small bowel. There is fecalization of the distal small bowel contents which is nonspecific but can be seen with slowed intestinal transit. No obstruction is evident. Postsurgical changes at the tip of the cecum likely reflect prior appendectomy. No colonic dilatation or wall thickening. Vascular/Lymphatic: Extensive atherosclerotic plaque within the normal caliber aorta and branch vessels. Evaluation for adenopathy limited in the absence of contrast media. No enlarged abdominopelvic nodes are clearly evident. Reproductive: Prostate is unremarkable. Other: Circumferential body wall edema. Hazy edematous changes of the mid mesentery is noted as well.  No free abdominopelvic fluid or free air. No bowel containing hernias. Musculoskeletal: Multilevel degenerative changes are present in the imaged portions of the spine. No acute osseous abnormality or suspicious osseous lesion. No suspicious or worrisome osseous lesions. IMPRESSION: 1. Features may suggest heart failure with cardiomegaly, pulmonary edema and bilateral effusions. Additional features of more generalized anasarca present as well with circumferential  body wall edema and edematous mesenteric changes. Given elevated LFTs, could consider possible hepatic congestion as an underlying etiology. 2. Pancreatic calcifications compatible with sequela of chronic pancreatitis. No acute peripancreatic inflammation. 3. Fecalization of the distal small bowel contents is nonspecific but can be seen with slowed intestinal transit. No obstruction is evident. 4. Extensive atherosclerotic plaque within the normal caliber aorta and branch vessels. 5. Hypoattenuation of the cardiac blood pool relative to the myocardium is typically compatible with anemia on this non contrast CT study. 6. Stable mild bilateral symmetric perinephric stranding, a nonspecific finding though may correlate with either age or decreased renal function. 7. Aortic Atherosclerosis (ICD10-I70.0). Electronically Signed   By: Lovena Le M.D.   On: 07/17/2019 23:30   CT Head Wo Contrast  Result Date: 07/05/2019 CLINICAL DATA:  Altered mental status EXAM: CT HEAD WITHOUT CONTRAST TECHNIQUE: Contiguous axial images were obtained from the base of the skull through the vertex without intravenous contrast. COMPARISON:  CT head 06/04/2019 FINDINGS: Brain: Stable regions of encephalomalacia in the bilateral frontal lobes, insula and right caudate likely as a result of prior ischemic insult. Additional areas of remote lacunar infarct again seen in the basal ganglia. No convincing areas of acute CT evident infarction. Evaluation of the inferior temporal lobes and cerebellum are limited due to motion artifact. Patchy areas of white matter hypoattenuation are most compatible with chronic microvascular angiopathy. Symmetric prominence of the ventricles, cisterns and sulci compatible with parenchymal volume loss. Vascular: Atherosclerotic calcification of the carotid siphons and intradural vertebral arteries. No hyperdense vessel though evaluation of the skull base is limited due to motion. Skull: Motion artifact may  limit detection of subtle osseous injuries. Sinuses/Orbits: Persistent chronic opacification of the left frontal sinus. No air-fluid levels. Remaining paranasal sinuses and mastoid air cells are grossly clear. Orbital structures are difficult to evaluate in the setting of motion. Other: None IMPRESSION: Imaging quality is significantly degraded by patient motion artifact particularly towards the inferior temporal lobes, skull base and posterior fossa. No definite acute intracranial abnormality. Stable regions of encephalomalacia in the frontal lobes, insula, caudate and basal ganglia. Chronic microvascular angiopathy changes and parenchymal volume loss, similar to prior. Electronically Signed   By: Lovena Le M.D.   On: 07/05/2019 22:58   MR BRAIN WO CONTRAST  Result Date: 07/06/2019 CLINICAL DATA:  Seizure, history of stroke EXAM: MRI HEAD WITHOUT CONTRAST TECHNIQUE: Multiplanar, multiecho pulse sequences of the brain and surrounding structures were obtained without intravenous contrast. COMPARISON:  2017 FINDINGS: Motion artifact is present. Brain: There is no acute infarction or hemorrhage. Encephalomalacia is present in the left frontal lobe and insula including involvement of the precentral gyrus with associated volume loss. Smaller chronic infarcts are present in the right frontal lobe. There is a chronic small vessel infarct of the right caudate. New T2 hyperintensity in the right parieto-occipital region without diffusion abnormality appears to be primarily subcortical. Additional patchy T2 hyperintensity in the supratentorial white matter is nonspecific but probably reflects stable chronic microvascular ischemic changes. Susceptibility weighted imaging is degraded by motion. No evidence of hemorrhage. There is no intracranial mass or mass  effect. Prominence of the ventricles and sulci reflects similar generalized parenchymal volume loss. Vascular: As before, there is loss of the normal flow voids of  the visualized internal carotid arteries. Skull and upper cervical spine: Normal marrow signal is preserved. Sinuses/Orbits: Chronic left frontal sinus opacification. Orbits are unremarkable. Other: Mastoid air cells are clear.  Sella is unremarkable. IMPRESSION: Suboptimal evaluation due to motion artifact. No acute infarction or hemorrhage. Multiple chronic infarcts involving bilateral cerebral hemispheres. New subcortical T2 hyperintensity in the right parietooccipital region without definite cortical involvement. This could reflect atypical unilateral PRES. Seizure related edema secondary to occipital seizure in the setting of hyperosmolar hyperglycemic state is a possibility but imaging appearance is not convincing for this. Electronically Signed   By: Macy Mis M.D.   On: 07/06/2019 12:33   DG Hand 2 View Right  Result Date: 07/05/2019 CLINICAL DATA:  Found down, right hand swelling and weakness EXAM: RIGHT HAND - 2 VIEW COMPARISON:  None. FINDINGS: Frontal and lateral views of the right hand are obtained. There are no acute displaced fractures alignment is grossly anatomic. Joint spaces are well preserved. Mild diffuse soft tissue edema is noted. IMPRESSION: 1. Soft tissue edema.  No acute bony abnormality Electronically Signed   By: Randa Ngo M.D.   On: 07/05/2019 22:37   DG Chest Port 1 View  Result Date: 07/05/2019 CLINICAL DATA:  Altered level of consciousness, found down, end-stage renal disease EXAM: PORTABLE CHEST 1 VIEW COMPARISON:  06/05/2019 FINDINGS: Two frontal views of the chest demonstrate right-sided dialysis catheter unchanged. Cardiac silhouette is stable. There is chronic central vascular congestion, with increased interstitial prominence likely representing fluid overload. No effusion or pneumothorax. No acute bony abnormalities. IMPRESSION: 1. Vascular congestion and increased interstitial prominence consistent with fluid overload. Electronically Signed   By: Randa Ngo M.D.   On: 07/05/2019 22:38    Assessment and Plan:   [name] is a 59 y.o. y/o male with diarrhea and a colonoscopy with polyps showing dysplasia. The patient was suppose to have a dialysis catheter placed tomorrow but it was cancelled due to cardiac concerned. The patient is having a cardiology evaluation pending. The patient will avoid milk to see if it makes a difference and it not will the start Imodium daily to help the diarrhea. After the cardia workup is complete and if he is deemed stable then he will need a repeat colonoscopy due to the polyp and for biopsies if the diarrhea has not resolved.       Lucilla Lame, MD. Marval Regal    Note: This dictation was prepared with Dragon dictation along with smaller phrase technology. Any transcriptional errors that result from this process are unintentional.

## 2019-07-22 ENCOUNTER — Encounter: Admission: RE | Payer: Self-pay | Source: Home / Self Care

## 2019-07-22 ENCOUNTER — Ambulatory Visit: Admission: RE | Admit: 2019-07-22 | Payer: Medicare Other | Source: Home / Self Care | Admitting: Vascular Surgery

## 2019-07-22 SURGERY — INSERTION OF ARTERIOVENOUS (AV) GORE-TEX GRAFT ARM
Anesthesia: General | Laterality: Right

## 2019-08-05 ENCOUNTER — Ambulatory Visit: Payer: Medicare Other | Admitting: Gastroenterology

## 2019-08-06 ENCOUNTER — Ambulatory Visit: Payer: Medicare Other | Admitting: Gastroenterology

## 2019-08-17 ENCOUNTER — Telehealth (INDEPENDENT_AMBULATORY_CARE_PROVIDER_SITE_OTHER): Payer: Self-pay | Admitting: Vascular Surgery

## 2019-08-17 ENCOUNTER — Other Ambulatory Visit (INDEPENDENT_AMBULATORY_CARE_PROVIDER_SITE_OTHER): Payer: Self-pay | Admitting: Vascular Surgery

## 2019-08-17 DIAGNOSIS — M7989 Other specified soft tissue disorders: Secondary | ICD-10-CM

## 2019-08-17 NOTE — Telephone Encounter (Signed)
Sister of patient called stating that his right arm is severely swollen and the case worker advised her to call us to see if we can get him in to be seen.

## 2019-08-17 NOTE — Telephone Encounter (Signed)
Please advise 

## 2019-08-18 ENCOUNTER — Encounter (INDEPENDENT_AMBULATORY_CARE_PROVIDER_SITE_OTHER): Payer: Medicare Other

## 2019-08-18 ENCOUNTER — Ambulatory Visit (INDEPENDENT_AMBULATORY_CARE_PROVIDER_SITE_OTHER): Payer: Medicare Other | Admitting: Nurse Practitioner

## 2019-08-24 ENCOUNTER — Observation Stay
Admission: EM | Admit: 2019-08-24 | Discharge: 2019-08-25 | Disposition: A | Discharge status: Home / Self Care | Payer: Medicare Other | Attending: Internal Medicine | Admitting: Internal Medicine

## 2019-08-24 ENCOUNTER — Other Ambulatory Visit: Payer: Self-pay

## 2019-08-24 ENCOUNTER — Encounter: Payer: Self-pay | Admitting: Emergency Medicine

## 2019-08-24 ENCOUNTER — Emergency Department: Payer: Medicare Other

## 2019-08-24 DIAGNOSIS — X58XXXA Exposure to other specified factors, initial encounter: Secondary | ICD-10-CM | POA: Insufficient documentation

## 2019-08-24 DIAGNOSIS — S2231XA Fracture of one rib, right side, initial encounter for closed fracture: Secondary | ICD-10-CM | POA: Insufficient documentation

## 2019-08-24 DIAGNOSIS — E162 Hypoglycemia, unspecified: Secondary | ICD-10-CM

## 2019-08-24 DIAGNOSIS — N186 End stage renal disease: Secondary | ICD-10-CM | POA: Diagnosis not present

## 2019-08-24 DIAGNOSIS — Z888 Allergy status to other drugs, medicaments and biological substances status: Secondary | ICD-10-CM | POA: Insufficient documentation

## 2019-08-24 DIAGNOSIS — M6281 Muscle weakness (generalized): Secondary | ICD-10-CM | POA: Diagnosis not present

## 2019-08-24 DIAGNOSIS — J810 Acute pulmonary edema: Secondary | ICD-10-CM | POA: Diagnosis not present

## 2019-08-24 DIAGNOSIS — E785 Hyperlipidemia, unspecified: Secondary | ICD-10-CM | POA: Diagnosis not present

## 2019-08-24 DIAGNOSIS — N179 Acute kidney failure, unspecified: Secondary | ICD-10-CM | POA: Insufficient documentation

## 2019-08-24 DIAGNOSIS — Z794 Long term (current) use of insulin: Secondary | ICD-10-CM | POA: Insufficient documentation

## 2019-08-24 DIAGNOSIS — R2689 Other abnormalities of gait and mobility: Secondary | ICD-10-CM | POA: Insufficient documentation

## 2019-08-24 DIAGNOSIS — E1122 Type 2 diabetes mellitus with diabetic chronic kidney disease: Secondary | ICD-10-CM | POA: Insufficient documentation

## 2019-08-24 DIAGNOSIS — Z79899 Other long term (current) drug therapy: Secondary | ICD-10-CM | POA: Diagnosis not present

## 2019-08-24 DIAGNOSIS — Z7982 Long term (current) use of aspirin: Secondary | ICD-10-CM | POA: Diagnosis not present

## 2019-08-24 DIAGNOSIS — Z8673 Personal history of transient ischemic attack (TIA), and cerebral infarction without residual deficits: Secondary | ICD-10-CM | POA: Diagnosis not present

## 2019-08-24 DIAGNOSIS — Z841 Family history of disorders of kidney and ureter: Secondary | ICD-10-CM | POA: Diagnosis not present

## 2019-08-24 DIAGNOSIS — F17290 Nicotine dependence, other tobacco product, uncomplicated: Secondary | ICD-10-CM | POA: Insufficient documentation

## 2019-08-24 DIAGNOSIS — Z20822 Contact with and (suspected) exposure to covid-19: Secondary | ICD-10-CM | POA: Diagnosis not present

## 2019-08-24 DIAGNOSIS — K219 Gastro-esophageal reflux disease without esophagitis: Secondary | ICD-10-CM | POA: Insufficient documentation

## 2019-08-24 DIAGNOSIS — Z992 Dependence on renal dialysis: Secondary | ICD-10-CM | POA: Diagnosis not present

## 2019-08-24 DIAGNOSIS — E11649 Type 2 diabetes mellitus with hypoglycemia without coma: Secondary | ICD-10-CM | POA: Diagnosis not present

## 2019-08-24 DIAGNOSIS — I160 Hypertensive urgency: Secondary | ICD-10-CM | POA: Insufficient documentation

## 2019-08-24 DIAGNOSIS — Z8249 Family history of ischemic heart disease and other diseases of the circulatory system: Secondary | ICD-10-CM | POA: Insufficient documentation

## 2019-08-24 DIAGNOSIS — E871 Hypo-osmolality and hyponatremia: Secondary | ICD-10-CM | POA: Insufficient documentation

## 2019-08-24 DIAGNOSIS — G9340 Encephalopathy, unspecified: Secondary | ICD-10-CM | POA: Diagnosis not present

## 2019-08-24 DIAGNOSIS — J9601 Acute respiratory failure with hypoxia: Secondary | ICD-10-CM | POA: Diagnosis not present

## 2019-08-24 DIAGNOSIS — I120 Hypertensive chronic kidney disease with stage 5 chronic kidney disease or end stage renal disease: Secondary | ICD-10-CM | POA: Diagnosis not present

## 2019-08-24 LAB — CBC WITH DIFFERENTIAL/PLATELET
Abs Immature Granulocytes: 0.01 10*3/uL (ref 0.00–0.07)
Basophils Absolute: 0.1 10*3/uL (ref 0.0–0.1)
Basophils Relative: 1 %
Eosinophils Absolute: 0.1 10*3/uL (ref 0.0–0.5)
Eosinophils Relative: 2 %
HCT: 46.9 % (ref 39.0–52.0)
Hemoglobin: 15.1 g/dL (ref 13.0–17.0)
Immature Granulocytes: 0 %
Lymphocytes Relative: 28 %
Lymphs Abs: 1.5 10*3/uL (ref 0.7–4.0)
MCH: 29.2 pg (ref 26.0–34.0)
MCHC: 32.2 g/dL (ref 30.0–36.0)
MCV: 90.7 fL (ref 80.0–100.0)
Monocytes Absolute: 0.4 10*3/uL (ref 0.1–1.0)
Monocytes Relative: 8 %
Neutro Abs: 3.5 10*3/uL (ref 1.7–7.7)
Neutrophils Relative %: 61 %
Platelets: 333 10*3/uL (ref 150–400)
RBC: 5.17 MIL/uL (ref 4.22–5.81)
RDW: 13.4 % (ref 11.5–15.5)
WBC: 5.6 10*3/uL (ref 4.0–10.5)
nRBC: 0 % (ref 0.0–0.2)

## 2019-08-24 LAB — TROPONIN I (HIGH SENSITIVITY): Troponin I (High Sensitivity): 14 ng/L (ref <18)

## 2019-08-24 LAB — RESPIRATORY PANEL BY RT PCR (FLU A&B, COVID)
Influenza A by PCR: NEGATIVE
Influenza B by PCR: NEGATIVE
SARS Coronavirus 2 by RT PCR: NEGATIVE

## 2019-08-24 LAB — BASIC METABOLIC PANEL
Anion gap: 11 (ref 5–15)
BUN: 25 mg/dL — ABNORMAL HIGH (ref 6–20)
CO2: 21 mmol/L — ABNORMAL LOW (ref 22–32)
Calcium: 8.6 mg/dL — ABNORMAL LOW (ref 8.9–10.3)
Chloride: 99 mmol/L (ref 98–111)
Creatinine, Ser: 3.94 mg/dL — ABNORMAL HIGH (ref 0.61–1.24)
GFR calc Af Amer: 18 mL/min — ABNORMAL LOW (ref >60)
GFR calc non Af Amer: 16 mL/min — ABNORMAL LOW (ref >60)
Glucose, Bld: 254 mg/dL — ABNORMAL HIGH (ref 70–99)
Potassium: 3.9 mmol/L (ref 3.5–5.1)
Sodium: 131 mmol/L — ABNORMAL LOW (ref 135–145)

## 2019-08-24 LAB — GLUCOSE, CAPILLARY
Glucose-Capillary: 95 mg/dL (ref 70–99)
Glucose-Capillary: 71 mg/dL (ref 70–99)
Glucose-Capillary: 137 mg/dL — ABNORMAL HIGH (ref 70–99)
Glucose-Capillary: 116 mg/dL — ABNORMAL HIGH (ref 70–99)

## 2019-08-24 LAB — LACTIC ACID, PLASMA: Lactic Acid, Venous: 1.3 mmol/L (ref 0.5–1.9)

## 2019-08-24 MED ORDER — AMLODIPINE BESYLATE 10 MG PO TABS
10.0000 mg | ORAL_TABLET | Freq: Every day | ORAL | Status: DC
  Administered 2019-08-25: 10 mg via ORAL
  Filled 2019-08-24: qty 1

## 2019-08-24 MED ORDER — SERTRALINE HCL 50 MG PO TABS
25.0000 mg | ORAL_TABLET | Freq: Every day | ORAL | Status: DC
  Administered 2019-08-24: 25 mg via ORAL
  Filled 2019-08-24: qty 1

## 2019-08-24 MED ORDER — SACUBITRIL-VALSARTAN 24-26 MG PO TABS
1.0000 | ORAL_TABLET | Freq: Two times a day (BID) | ORAL | Status: DC
  Administered 2019-08-25: 1 via ORAL
  Filled 2019-08-24 (×3): qty 1

## 2019-08-24 MED ORDER — HYDRALAZINE HCL 20 MG/ML IJ SOLN
10.0000 mg | Freq: Four times a day (QID) | INTRAMUSCULAR | Status: DC | PRN
  Administered 2019-08-24: 10 mg via INTRAVENOUS
  Filled 2019-08-24: qty 1
  Filled 2019-08-24: qty 0.5

## 2019-08-24 MED ORDER — INSULIN ASPART 100 UNIT/ML ~~LOC~~ SOLN
0.0000 [IU] | SUBCUTANEOUS | Status: DC
  Administered 2019-08-25: 2 [IU] via SUBCUTANEOUS
  Administered 2019-08-25: 1 [IU] via SUBCUTANEOUS
  Filled 2019-08-24 (×2): qty 1

## 2019-08-24 MED ORDER — THIAMINE HCL 100 MG PO TABS
100.0000 mg | ORAL_TABLET | Freq: Every day | ORAL | Status: DC
  Administered 2019-08-25: 100 mg via ORAL
  Filled 2019-08-24: qty 1

## 2019-08-24 MED ORDER — MELATONIN 5 MG PO TABS: 2.5000 mg | ORAL_TABLET | Freq: Every day | ORAL | Status: DC

## 2019-08-24 MED ORDER — RENA-VITE PO TABS
1.0000 | ORAL_TABLET | Freq: Every day | ORAL | Status: DC
  Filled 2019-08-24: qty 1

## 2019-08-24 MED ORDER — ASPIRIN EC 81 MG PO TBEC
81.0000 mg | DELAYED_RELEASE_TABLET | Freq: Every day | ORAL | Status: DC
  Administered 2019-08-25: 08:00:00 81 mg via ORAL
  Filled 2019-08-24: qty 1

## 2019-08-24 MED ORDER — FUROSEMIDE 10 MG/ML IJ SOLN
80.0000 mg | Freq: Once | INTRAMUSCULAR | Status: AC
  Administered 2019-08-24: 80 mg via INTRAVENOUS
  Filled 2019-08-24: qty 8

## 2019-08-24 MED ORDER — FOLIC ACID 1 MG PO TABS
1.0000 mg | ORAL_TABLET | Freq: Every day | ORAL | Status: DC
  Administered 2019-08-25: 1 mg via ORAL
  Filled 2019-08-24: qty 1

## 2019-08-24 MED ORDER — HYDRALAZINE HCL 25 MG PO TABS
25.0000 mg | ORAL_TABLET | Freq: Three times a day (TID) | ORAL | Status: DC
  Administered 2019-08-24 – 2019-08-25 (×3): 25 mg via ORAL
  Filled 2019-08-24 (×3): qty 1

## 2019-08-24 MED ORDER — METOPROLOL TARTRATE 50 MG PO TABS
50.0000 mg | ORAL_TABLET | Freq: Two times a day (BID) | ORAL | Status: DC
  Administered 2019-08-24 – 2019-08-25 (×2): 50 mg via ORAL
  Filled 2019-08-24 (×2): qty 1

## 2019-08-24 MED ORDER — HEPARIN SODIUM (PORCINE) 5000 UNIT/ML IJ SOLN
5000.0000 [IU] | Freq: Three times a day (TID) | INTRAMUSCULAR | Status: DC
  Administered 2019-08-24 – 2019-08-25 (×3): 5000 [IU] via SUBCUTANEOUS
  Filled 2019-08-24 (×3): qty 1

## 2019-08-24 MED ORDER — SODIUM CHLORIDE 0.9 % IV BOLUS: 500.0000 mL | Freq: Once | INTRAVENOUS | Status: DC

## 2019-08-24 MED ORDER — LEVETIRACETAM 500 MG PO TABS
500.0000 mg | ORAL_TABLET | Freq: Two times a day (BID) | ORAL | Status: DC
  Administered 2019-08-24 – 2019-08-25 (×2): 500 mg via ORAL
  Filled 2019-08-24 (×3): qty 1

## 2019-08-24 MED ORDER — PANTOPRAZOLE SODIUM 40 MG PO TBEC
40.0000 mg | DELAYED_RELEASE_TABLET | Freq: Every day | ORAL | Status: DC
  Administered 2019-08-25: 40 mg via ORAL
  Filled 2019-08-24: qty 1

## 2019-08-24 NOTE — ED Provider Notes (Signed)
[facility] Emergency Department Provider Note ____________________________________________   First MD Initiated Contact with Patient 08/24/19 1811     (approximate)  I have reviewed the triage vital signs and the nursing notes.   HISTORY  Chief Complaint Hypoglycemia  Level 5 caveat: History of present illness limited to poor historian  HPI [name] is a 59 y.o. male with PMH as noted below who presents with an episode of unresponsiveness and altered mental status.  Per EMS, the patient's family found him and began CPR.  When EMS arrived the glucose was unreadable (low).  The patient was given D10 with an improvement in the glucose and his mental status.  The patient himself denies any acute complaints.  Past Medical History:  Diagnosis Date  . Chronic kidney disease   . Diabetes mellitus without complication ([place])   . ETOH abuse   . GERD (gastroesophageal reflux disease)   . Hyperlipidemia   . Hypertension   . Stroke [facility])     Patient Active Problem List   Diagnosis Date Noted  . Acute metabolic encephalopathy 37/16/9678  . Hypertensive emergency 07/06/2019  . Acute encephalopathy 07/06/2019  . Seizures due to metabolic disorder ([place]) 93/81/0175  . Hyperosmolar hyperglycemic state (HHS) ([place]) 06/05/2019  . Anemia 06/04/2019  . Frequent falls 06/04/2019  . History of CVA (cerebrovascular accident) 06/04/2019  . HCAP (healthcare-associated pneumonia) 06/04/2019  . Acute blood loss anemia 06/04/2019  . Hyperglycemia 05/28/2019  . Fall at home, initial encounter 05/28/2019  . Rib fracture 05/28/2019  . Depression 05/28/2019  . Hypokalemia 05/28/2019  . Weakness 05/27/2019  . ESRD on hemodialysis ([place])   . Acute renal failure (ARF) ([place]) 04/09/2019  . Polyp of ascending colon   . Diarrhea   . AKI (acute kidney injury) ([place]) 03/04/2019  . Acute kidney injury ([place]) 07/26/2018  . ARF (acute renal failure) ([place]) 07/25/2018  . Bilateral  leg numbness 03/19/2018  . Numbness and tingling of both feet 03/19/2018  . CVA (cerebral vascular accident) ([place]) 03/17/2018  . Moderate episode of recurrent major depressive disorder ([place]) 03/11/2018  . UTI (urinary tract infection) 02/27/2018  . Hypoglycemia 01/15/2018  . Type 2 diabetes mellitus without complication, with long-term current use of insulin ([place]) 08/27/2017  . Protein-calorie malnutrition, severe 08/19/2017  . Pancreatitis, acute 08/16/2017  . DKA (diabetic ketoacidoses) ([place]) 08/16/2017  . HTN (hypertension) 08/16/2017  . HLD (hyperlipidemia) 08/16/2017  . Carotid stenosis 08/02/2016  . GERD (gastroesophageal reflux disease) 08/02/2016  . History of esophagogastroduodenoscopy (EGD) 07/01/2016  . History of recent blood transfusion 07/01/2016  . Acute renal failure superimposed on stage 4 chronic kidney disease ([place]) 07/01/2016  . Hyponatremia 07/01/2016  . Uncontrolled diabetes mellitus ([place]) 07/01/2016  . Alcohol abuse 07/01/2016  . Monilial esophagitis ([place]) 07/01/2016  . Tobacco abuse 07/01/2016  . Confusion 06/29/2016  . Iron deficiency anemia 06/29/2016  . Coagulopathy ([place]) 06/29/2016    Past Surgical History:  Procedure Laterality Date  . COLONOSCOPY WITH PROPOFOL N/A 03/06/2019   Procedure: COLONOSCOPY WITH PROPOFOL;  Surgeon: Lucilla Lame, MD;  Location: [facility] ENDOSCOPY;  Service: Endoscopy;  Laterality: N/A;  . DIALYSIS/PERMA CATHETER INSERTION N/A 04/10/2019   Procedure: DIALYSIS/PERMA CATHETER INSERTION;  Surgeon: Serafina Mitchell, MD;  Location: [place] CV LAB;  Service: Cardiovascular;  Laterality: N/A;  . ESOPHAGOGASTRODUODENOSCOPY (EGD) WITH PROPOFOL N/A 07/01/2016   Procedure: ESOPHAGOGASTRODUODENOSCOPY (EGD) WITH PROPOFOL;  Surgeon: San Jetty, MD;  Location: [facility] ENDOSCOPY;  Service: General;  Laterality: N/A;  Prior to Admission medications   Medication Sig Start Date End Date Taking? Authorizing Provider  amLODipine (NORVASC) 10  MG tablet Take 1 tablet (10 mg total) by mouth daily. 07/09/19   Nicole Kindred A, DO  ARIPiprazole (ABILIFY) 2 MG tablet Take 2 mg by mouth daily. 06/24/19   [provider]  aspirin EC 81 MG EC tablet Take 1 tablet (81 mg total) by mouth daily. 06/10/19   Jennye Boroughs, MD  folic acid (FOLVITE) 1 MG tablet Take 1 mg by mouth daily. 03/12/19   [provider]  hydrALAZINE (APRESOLINE) 25 MG tablet Take 1 tablet (25 mg total) by mouth every 8 (eight) hours. 07/08/19   Ezekiel Slocumb, DO  hydrocortisone 2.5 % cream Apply 1 application topically 2 (two) times daily as needed (rash).  04/08/19   [provider]  insulin glargine (LANTUS) 100 UNIT/ML injection Inject 0.1 mLs (10 Units total) into the skin daily. 06/09/19   Jennye Boroughs, MD  insulin lispro (HUMALOG KWIKPEN) 100 UNIT/ML KwikPen Inject 0-0.09 mLs (0-9 Units total) into the skin 3 (three) times daily with meals. According to sliding scale 07/08/19   Nicole Kindred A, DO  levETIRAcetam (KEPPRA) 500 MG tablet Take 1 tablet (500 mg total) by mouth 2 (two) times daily for 14 days. 07/08/19 07/22/19  Ezekiel Slocumb, DO  Melatonin 3 MG TABS Take 1 tablet by mouth at bedtime. 05/23/19   [provider]  metoprolol tartrate (LOPRESSOR) 50 MG tablet Take 1 tablet (50 mg total) by mouth 2 (two) times daily. 07/08/19   Ezekiel Slocumb, DO  multivitamin (RENA-VIT) TABS tablet Take 1 tablet by mouth at bedtime. 07/08/19   Ezekiel Slocumb, DO  Nutritional Supplements (FEEDING SUPPLEMENT, NEPRO CARB STEADY,) LIQD Take 237 mLs by mouth 3 (three) times daily between meals. 07/08/19   Nicole Kindred A, DO  pantoprazole (PROTONIX) 40 MG tablet Take by mouth. 06/17/19   [provider]  sertraline (ZOLOFT) 100 MG tablet Take by mouth. 06/17/19   [provider]  thiamine 100 MG tablet Take 1 tablet (100 mg total) by mouth daily. 07/08/19   Ezekiel Slocumb, DO    Allergies Ferrous gluconate  Family  History  Problem Relation Age of Onset  . CAD Brother   . Dementia Mother   . Renal Disease Father     Social History Social History   Tobacco Use  . Smoking status: Current Every Day Smoker    Packs/day: 0.25    Years: 5.00    Pack years: 1.25    Types: Cigars  . Smokeless tobacco: Never Used  Substance Use Topics  . Alcohol use: Yes    Alcohol/week: 16.0 standard drinks    Types: 14 Cans of beer, 2 Shots of liquor per week  . Drug use: No    Review of Systems Level 5 caveat: Unable to obtain review of systems due to poor historian    ____________________________________________   PHYSICAL EXAM:  VITAL SIGNS: ED Triage Vitals [08/24/19 1841]  Enc Vitals Group     BP (!) 180/98     Pulse Rate 74     Resp 12     Temp 98.6 F (37 C)     Temp Source Oral     SpO2 (!) 82 %     Weight 145 lb 1 oz (65.8 kg)     Height 5\' 11"  (1.803 m)     Head Circumference      Peak Flow  Pain Score 0     Pain Loc      Pain Edu?      Excl. in [place]?     Constitutional: Alert, somewhat lethargic appearing.  Able to answer some questions. Eyes: Conjunctivae are normal.  EOMI.  [place]. Head: Atraumatic. Nose: No congestion/rhinnorhea. Mouth/Throat: Mucous membranes are slightly dry.   Neck: Normal range of motion.  Cardiovascular: Normal rate, regular rhythm. Grossly normal heart sounds.  Good peripheral circulation. Respiratory: Normal respiratory effort.  No retractions. Lungs CTAB. Gastrointestinal: Soft and nontender. No distention.  Genitourinary: No flank tenderness. Musculoskeletal: No lower extremity edema.  Extremities warm and well perfused.  Neurologic: Motor intact in all extremities. Skin:  Skin is warm and dry. No rash noted. Psychiatric: Calm and cooperative.  ____________________________________________   LABS (all labs ordered are listed, but only abnormal results are displayed)  Labs Reviewed  BASIC METABOLIC PANEL - Abnormal; Notable for the  following components:      Result Value   Sodium 131 (*)    CO2 21 (*)    Glucose, Bld 254 (*)    BUN 25 (*)    Creatinine, Ser 3.94 (*)    Calcium 8.6 (*)    GFR calc non Af Amer 16 (*)    GFR calc Af Amer 18 (*)    All other components within normal limits  GLUCOSE, CAPILLARY - Abnormal; Notable for the following components:   Glucose-Capillary 137 (*)    All other components within normal limits  RESPIRATORY PANEL BY RT PCR (FLU A&B, COVID)  GLUCOSE, CAPILLARY  CBC WITH DIFFERENTIAL/PLATELET  LACTIC ACID, PLASMA  GLUCOSE, CAPILLARY  LACTIC ACID, PLASMA  URINALYSIS, COMPLETE (UACMP) WITH MICROSCOPIC  TROPONIN I (HIGH SENSITIVITY)  TROPONIN I (HIGH SENSITIVITY)   ____________________________________________  EKG  ED ECG REPORT I, Arta Silence, the attending physician, personally viewed and interpreted this ECG.  Date: 08/24/2019 EKG Time: 1812 Rate: 69 Rhythm: normal sinus rhythm QRS Axis: normal Intervals: LAFB, prolonged QTc ST/T Wave abnormalities: normal Narrative Interpretation: no evidence of acute ischemia  ____________________________________________  RADIOLOGY  CXR: Bilateral interstitial opacities consistent with edema  ____________________________________________   PROCEDURES  Procedure(s) performed: No  Procedures  Critical Care performed: No ____________________________________________   INITIAL IMPRESSION / ASSESSMENT AND PLAN / ED COURSE  Pertinent labs & imaging results that were available during my care of the patient were reviewed by me and considered in my medical decision making (see chart for details).  59 year old male with history of diabetes, ESRD on dialysis, and other PMH as noted above presents after he was found unresponsive by family who started CPR.  When EMS arrived, the patient was found to have a pulse and stable vital signs, but was hypoglycemic and lethargic and received D10 in the field.  I reviewed the past  medical records in [place].  The patient was most recently admitted in February due to altered mental status and seizure in the setting of HHS and severe hypertension.  Per the prior notes, the patient's past medical history also includes CVA with residual right-sided weakness and alcohol abuse.  On exam today, the patient is awake but somewhat lethargic appearing.  He is able to follow commands and answer some questions but cannot give much useful history.  He denies any acute complaints.  He has hypertensive and hypoxic to the low 80s on room air.  Neurologic exam is nonfocal.  The rest of the exam is as described above.  Differential includes hypoglycemia related to his  medications and decreased p.o. intake, electrolyte abnormality or other metabolic cause, infection, seizure or other CNS etiology, aspiration pneumonia, fluid overload related to ESRD, or possible alcohol intoxication.  We will obtain chest x-ray, lab work-up, and reassess.  ----------------------------------------- 8:55 PM on 08/24/2019 -----------------------------------------  Lab work-up so far is reassuring with no significant electrolyte abnormalities and a glucose in the 200s.  Chest x-ray shows bilateral interstitial opacity consistent with edema.  The patient reports that he still does make urine, so I have ordered Lasix for diuresis.  Given the patient's significant hypoxia and the need to observe further for hypoglycemia, we will admit.  I discussed this case with hospitalist.  ____________________________________________   FINAL CLINICAL IMPRESSION(S) / ED DIAGNOSES  Final diagnoses:  Acute respiratory failure with hypoxia ([place])  Hypoglycemia      NEW MEDICATIONS STARTED DURING THIS VISIT:  New Prescriptions   No medications on file     Note:  This document was prepared using Dragon voice recognition software and may include unintentional dictation errors.    Arta Silence, MD 08/24/19 2056

## 2019-08-24 NOTE — ED Triage Notes (Signed)
Pt presents from home via acems with c/o hypoglycemia. EMS reports family found pt unresponsive and began cpr. When ems arrived pt glucose was unreadable. Pt given 500cc bag of d10. Blood sugar increased to 144 for ems. Upon arrival to ed pt lethargic, but answering questions appropriately. Pt blood sugar 95 on arrival. Pt is dialysis pt, goes tues, thurs, Saturday.

## 2019-08-24 NOTE — ED Notes (Signed)
Pt appears to be upset. This RN asked why and patient states he's been waiting on his food for 30 minutes. This RN gets food for the patient and warms it up. Pt denies needs at this time, will continue to monitor.

## 2019-08-24 NOTE — H&P (Signed)
Chief Complaint: Patient found down and unresponsive HPI: NOTE THAT PATIENT IS A POOR HISTORIAN   [name] is a 59 y.o. male with medical history significant forESRD on HD Tu/Thurs/Sat, Type 2 diabetes with recurrent hypoglycemia and hyperglycemia crisis, likely due to non compliance,CVA w/ right sided weakness, chronic bilateral ICA occlusion, hypertension and hyperlipidemia.  He also has a history of alcohol abuse and dependence in the past.  Unclear if he has had recent exposure.  He was last admitted in February on account of acute encephalopathy and was presumed secondary to PRES,Hypertensive urgency and ? Seizure disorder. Patient was initiated on keppra during that admission but unclear if patient has been compliant with medication(Medication not on MED REC). He was brought to the hospital because of altered mental status after patient being found down by family. Patient is unable to recall how he got to the ED or the events prior to this admission.  When EMS arrived, blood glucose was noted to be hypoglycemic and was given D10 with gradual improvement of mentation but a time patient arrived in the ED.     Past Medical History:  Diagnosis Date  . Chronic kidney disease   . Diabetes mellitus without complication ([place])   . ETOH abuse   . GERD (gastroesophageal reflux disease)   . Hyperlipidemia   . Hypertension   . Stroke [facility])     Past Surgical History:  Procedure Laterality Date  . COLONOSCOPY WITH PROPOFOL N/A 03/06/2019   Procedure: COLONOSCOPY WITH PROPOFOL;  Surgeon: Lucilla Lame, MD;  Location: [facility] ENDOSCOPY;  Service: Endoscopy;  Laterality: N/A;  . DIALYSIS/PERMA CATHETER INSERTION N/A 04/10/2019   Procedure: DIALYSIS/PERMA CATHETER INSERTION;  Surgeon: Serafina Mitchell, MD;  Location: [place] CV LAB;  Service: Cardiovascular;  Laterality: N/A;  . ESOPHAGOGASTRODUODENOSCOPY (EGD) WITH PROPOFOL N/A 07/01/2016   Procedure: ESOPHAGOGASTRODUODENOSCOPY (EGD) WITH  PROPOFOL;  Surgeon: San Jetty, MD;  Location: [facility] ENDOSCOPY;  Service: General;  Laterality: N/A;    Family History  Problem Relation Age of Onset  . CAD Brother   . Dementia Mother   . Renal Disease Father    Social History:  reports that he has been smoking cigars. He has a 1.25 pack-year smoking history. He has never used smokeless tobacco. He reports current alcohol use of about 16.0 standard drinks of alcohol per week. He reports that he does not use drugs.  Allergies:  Allergies  Allergen Reactions  . Ferrous Gluconate Nausea And Vomiting    (Not in a hospital admission)   Results for orders placed or performed during the hospital encounter of 08/24/19 (from the past 48 hour(s))  Basic metabolic panel     Status: Abnormal   Collection Time: 08/24/19  6:13 PM  Result Value Ref Range   Sodium 131 (L) 135 - 145 mmol/L   Potassium 3.9 3.5 - 5.1 mmol/L    Comment: HEMOLYSIS AT THIS LEVEL MAY AFFECT RESULT   Chloride 99 98 - 111 mmol/L   CO2 21 (L) 22 - 32 mmol/L   Glucose, Bld 254 (H) 70 - 99 mg/dL    Comment: Glucose reference range applies only to samples taken after fasting for at least 8 hours.   BUN 25 (H) 6 - 20 mg/dL   Creatinine, Ser 3.94 (H) 0.61 - 1.24 mg/dL   Calcium 8.6 (L) 8.9 - 10.3 mg/dL   GFR calc non Af Amer 16 (L) >60 mL/min   GFR calc Af Amer 18 (L) >60 mL/min  Anion gap 11 5 - 15    Comment: Performed at [facility], [address] [place], [place] [address]  CBC with Differential     Status: None   Collection Time: 08/24/19  6:13 PM  Result Value Ref Range   WBC 5.6 4.0 - 10.5 K/uL   RBC 5.17 4.22 - 5.81 MIL/uL   Hemoglobin 15.1 13.0 - 17.0 g/dL   HCT 46.9 39.0 - 52.0 %   MCV 90.7 80.0 - 100.0 fL   MCH 29.2 26.0 - 34.0 pg   MCHC 32.2 30.0 - 36.0 g/dL   RDW 13.4 11.5 - 15.5 %   Platelets 333 150 - 400 K/uL   nRBC 0.0 0.0 - 0.2 %   Neutrophils Relative % 61 %   Neutro Abs 3.5 1.7 - 7.7 K/uL   Lymphocytes Relative 28 %   Lymphs  Abs 1.5 0.7 - 4.0 K/uL   Monocytes Relative 8 %   Monocytes Absolute 0.4 0.1 - 1.0 K/uL   Eosinophils Relative 2 %   Eosinophils Absolute 0.1 0.0 - 0.5 K/uL   Basophils Relative 1 %   Basophils Absolute 0.1 0.0 - 0.1 K/uL   Immature Granulocytes 0 %   Abs Immature Granulocytes 0.01 0.00 - 0.07 K/uL    Comment: Performed at [facility], [place], [place] [address]  Troponin I (High Sensitivity)     Status: None   Collection Time: 08/24/19  6:13 PM  Result Value Ref Range   Troponin I (High Sensitivity) 14 <18 ng/L    Comment: (NOTE) Elevated high sensitivity troponin I (hsTnI) values and significant  changes across serial measurements may suggest ACS but many other  chronic and acute conditions are known to elevate hsTnI results.  Refer to the "Links" section for chest pain algorithms and additional  guidance. Performed at [facility], [address] [place], [place] [address]   Lactic acid, plasma     Status: None   Collection Time: 08/24/19  6:13 PM  Result Value Ref Range   Lactic Acid, Venous 1.3 0.5 - 1.9 mmol/L    Comment: Performed at [facility], [address] [place], [place] [address]  Respiratory Panel by RT PCR (Flu A&B, Covid) - Nasopharyngeal Swab     Status: None   Collection Time: 08/24/19  6:13 PM   Specimen: Nasopharyngeal Swab  Result Value Ref Range   SARS Coronavirus 2 by RT PCR NEGATIVE NEGATIVE    Comment: (NOTE) SARS-CoV-2 target nucleic acids are NOT DETECTED. The SARS-CoV-2 RNA is generally detectable in upper respiratoy specimens during the acute phase of infection. The lowest concentration of SARS-CoV-2 viral copies this assay can detect is 131 copies/mL. A negative result does not preclude SARS-Cov-2 infection and should not be used as the sole basis for treatment or other patient management decisions. A negative result may occur with  improper specimen collection/handling, submission of specimen  other than nasopharyngeal swab, presence of viral mutation(s) within the areas targeted by this assay, and inadequate number of viral copies (<131 copies/mL). A negative result must be combined with clinical observations, patient history, and epidemiological information. The expected result is Negative. Fact Sheet for Patients:  PinkCheek.be Fact Sheet for Healthcare Providers:  GravelBags.it This test is not yet ap proved or cleared by the [place] FDA and  has been authorized for detection and/or diagnosis of SARS-CoV-2 by FDA under an Emergency Use Authorization (EUA). This EUA will remain  in effect (  meaning this test can be used) for the duration of the COVID-19 declaration under Section 564(b)(1) of the Act, 21 U.S.C. section 360bbb-3(b)(1), unless the authorization is terminated or revoked sooner.    Influenza A by PCR NEGATIVE NEGATIVE   Influenza B by PCR NEGATIVE NEGATIVE    Comment: (NOTE) The Xpert Xpress SARS-CoV-2/FLU/RSV assay is intended as an aid in  the diagnosis of influenza from Nasopharyngeal swab specimens and  should not be used as a sole basis for treatment. Nasal washings and  aspirates are unacceptable for Xpert Xpress SARS-CoV-2/FLU/RSV  testing. Fact Sheet for Patients: PinkCheek.be Fact Sheet for Healthcare Providers: GravelBags.it This test is not yet approved or cleared by the [place] FDA and  has been authorized for detection and/or diagnosis of SARS-CoV-2 by  FDA under an Emergency Use Authorization (EUA). This EUA will remain  in effect (meaning this test can be used) for the duration of the  Covid-19 declaration under Section 564(b)(1) of the Act, 21  U.S.C. section 360bbb-3(b)(1), unless the authorization is  terminated or revoked. Performed at [facility], [address] [place], [place] [address]    Glucose, capillary     Status: None   Collection Time: 08/24/19  6:20 PM  Result Value Ref Range   Glucose-Capillary 95 70 - 99 mg/dL    Comment: Glucose reference range applies only to samples taken after fasting for at least 8 hours.  Glucose, capillary     Status: None   Collection Time: 08/24/19  7:01 PM  Result Value Ref Range   Glucose-Capillary 71 70 - 99 mg/dL    Comment: Glucose reference range applies only to samples taken after fasting for at least 8 hours.  Glucose, capillary     Status: Abnormal   Collection Time: 08/24/19  8:29 PM  Result Value Ref Range   Glucose-Capillary 137 (H) 70 - 99 mg/dL    Comment: Glucose reference range applies only to samples taken after fasting for at least 8 hours.   DG Chest Portable 1 View  Result Date: 08/24/2019 CLINICAL DATA:  Hypoxia EXAM: PORTABLE CHEST 1 VIEW COMPARISON:  07/05/2019 FINDINGS: Right-sided central venous catheter with tip over the proximal right atrium. Cardiomegaly with vascular congestion and diffuse interstitial opacity probably representing mild edema. No large effusion. Aortic atherosclerosis. Acute to subacute fracture involving the right eighth lateral rib. IMPRESSION: Cardiomegaly with vascular congestion and mild diffuse interstitial opacity suspect for pulmonary edema. Acute to subacute appearing right eighth rib fracture Electronically Signed   By: Donavan Foil M.D.   On: 08/24/2019 19:36    Review of Systems  Unable to perform ROS: Mental status change  Constitutional: Negative.   HENT: Negative.   Eyes: Negative.   Respiratory: Negative.   Gastrointestinal: Negative.   Endocrine: Negative.   Genitourinary: Negative.   Hematological: Negative.   Psychiatric/Behavioral: Negative.   All other systems reviewed and are negative.   Blood pressure (!) 180/98, pulse 74, temperature 98.6 F (37 C), temperature source Oral, resp. rate 12, height 5\' 11"  (1.803 m), weight 65.8 kg, SpO2 (!) 82 %. Physical Exam   Constitutional: He appears well-developed and well-nourished.  HENT:  Head: Normocephalic.  Eyes: Pupils are equal, round, and reactive to light.  Cardiovascular: Normal rate and regular rhythm.  Murmur heard.  Systolic murmur is present with a grade of 2/6. Respiratory: Effort normal and breath sounds normal.  GI: Soft. Bowel sounds are normal.  Musculoskeletal:        General:  Normal range of motion.     Cervical back: Normal range of motion and neck supple.  Neurological: He is alert.  Skin: Skin is dry.  Psychiatric: Cognition and memory are impaired.     Assessment/Plan #1. Acute Encephalopathy-likely secondary to hypoglycemia.  Other possible causes may include breakthrough seizure.  Patient's mentation is back to baseline at this time after dextrose was given.  Will monitor fingerstick glucose every 4 hourly.  Replete as appropriate.  #2. Hypertensive urgency -.Present on admission as evidenced by severely uncontrolled blood pressure.Patient has end-stage renal disease with history of poorly controlled hypertension.  Poor compliance with medication may likely be contributory.  Patient will be optimized with hydralazine as needed.  Resume medications on amlodipine and metoprolol. - #3. Hypoglycemia: Patient responded to D10.  Blood glucose will be monitored as appropriate.  Fingerstick glucose as per protocol.Urinalysis pending to rule out UTI  #4. [name] disorder-we will send for alcohol levels.  Alcohol cessation counseling will be offered prior to discharge.  Will be continued on thiamine  #5. ESRD on hemodialysis on Tuesday,Thursdays and Saturdays.  Nephrology will be consulted as appropriate.  Renal diet advised.  #6. Hyponatremia: Likely due to hypervolemia-  #7.  Acute pulmonary edema, present admission likely due to malignant hypertension.  Patient was given IV Lasix at 80 mg x 1.  Anticipated dialysis schedule for a.m.  #8.  VT prophylaxis with  heparin [place], MD 08/24/2019, 9:54 PM

## 2019-08-25 DIAGNOSIS — E162 Hypoglycemia, unspecified: Secondary | ICD-10-CM | POA: Diagnosis not present

## 2019-08-25 DIAGNOSIS — J9601 Acute respiratory failure with hypoxia: Secondary | ICD-10-CM | POA: Diagnosis not present

## 2019-08-25 LAB — CBC
HCT: 33 % — ABNORMAL LOW (ref 39.0–52.0)
Hemoglobin: 11.3 g/dL — ABNORMAL LOW (ref 13.0–17.0)
MCH: 29.6 pg (ref 26.0–34.0)
MCHC: 34.2 g/dL (ref 30.0–36.0)
MCV: 86.4 fL (ref 80.0–100.0)
Platelets: 256 10*3/uL (ref 150–400)
RBC: 3.82 MIL/uL — ABNORMAL LOW (ref 4.22–5.81)
RDW: 13.2 % (ref 11.5–15.5)
WBC: 7.5 10*3/uL (ref 4.0–10.5)
nRBC: 0 % (ref 0.0–0.2)

## 2019-08-25 LAB — BASIC METABOLIC PANEL
Anion gap: 8 (ref 5–15)
BUN: 29 mg/dL — ABNORMAL HIGH (ref 6–20)
CO2: 23 mmol/L (ref 22–32)
Calcium: 8.1 mg/dL — ABNORMAL LOW (ref 8.9–10.3)
Chloride: 103 mmol/L (ref 98–111)
Creatinine, Ser: 4.3 mg/dL — ABNORMAL HIGH (ref 0.61–1.24)
GFR calc Af Amer: 16 mL/min — ABNORMAL LOW (ref >60)
GFR calc non Af Amer: 14 mL/min — ABNORMAL LOW (ref >60)
Glucose, Bld: 150 mg/dL — ABNORMAL HIGH (ref 70–99)
Potassium: 3.2 mmol/L — ABNORMAL LOW (ref 3.5–5.1)
Sodium: 134 mmol/L — ABNORMAL LOW (ref 135–145)

## 2019-08-25 LAB — GLUCOSE, CAPILLARY
Glucose-Capillary: 53 mg/dL — ABNORMAL LOW (ref 70–99)
Glucose-Capillary: 183 mg/dL — ABNORMAL HIGH (ref 70–99)
Glucose-Capillary: 145 mg/dL — ABNORMAL HIGH (ref 70–99)
Glucose-Capillary: 150 mg/dL — ABNORMAL HIGH (ref 70–99)
Glucose-Capillary: 250 mg/dL — ABNORMAL HIGH (ref 70–99)
Glucose-Capillary: 170 mg/dL — ABNORMAL HIGH (ref 70–99)

## 2019-08-25 LAB — PHOSPHORUS: Phosphorus: 3.4 mg/dL (ref 2.5–4.6)

## 2019-08-25 LAB — TSH: TSH: 3.657 u[IU]/mL (ref 0.350–4.500)

## 2019-08-25 MED ORDER — DEXTROSE 50 % IV SOLN
1.0000 | Freq: Once | INTRAVENOUS | Status: AC
  Administered 2019-08-25: 50 mL via INTRAVENOUS

## 2019-08-25 MED ORDER — DEXTROSE 10 % IV SOLN: INTRAVENOUS | Status: DC

## 2019-08-25 MED ORDER — HEPARIN SODIUM (PORCINE) 1000 UNIT/ML DIALYSIS
20.0000 [IU]/kg | INTRAMUSCULAR | Status: DC | PRN
  Filled 2019-08-25: qty 2

## 2019-08-25 MED ORDER — CHLORHEXIDINE GLUCONATE CLOTH 2 % EX PADS
6.0000 | MEDICATED_PAD | Freq: Every day | CUTANEOUS | Status: DC
  Administered 2019-08-25: 6 via TOPICAL

## 2019-08-25 NOTE — Discharge Summary (Signed)
Physician Discharge Summary  [name] XBD:[id number] DOB: [birth year] DOA: 08/24/2019  PCP: [name], MD  Admit date: 08/24/2019 Discharge date: 08/25/2019  Admitted From: Home Disposition:  Home  Recommendations for Outpatient Follow-up:  1. Follow up with PCP in 1-2 weeks 2. Please obtain BMP/CBC in one week 3. Please follow up on the following pending results:None  Home Health:Yes Equipment/Devices: Rolling walker Discharge Condition: Fair CODE STATUS: Full Diet recommendation: Heart Healthy / Carb Modified    Brief/Interim Summary: [name] is a 59 y.o. male with medical history significant forESRD on HD Tu/Thurs/Sat, Type 2 diabeteswith recurrent hypoglycemia and hyperglycemia crisis, likely due to non compliance,CVA w/ right sided weakness, chronic bilateral ICA occlusion, hypertension and hyperlipidemia.He also has a history of alcohol abuse and dependence in the past.  Unclear if he has had recent exposure.  He was last admitted in February on account of acute encephalopathy and was presumed secondary to PRES,Hypertensive urgency and ? Seizure disorder. Patient was initiated on keppra during that admission but unclear if patient has been compliant with medication(Medication not on MED REC). He was brought to the hospital because of altered mental status after patient being found down by family. Patient is unable to recall how he got to the ED or the events prior to this admission.  When EMS arrived, blood glucose was noted to be hypoglycemic and was given D10 with gradual improvement of mentation. Patient had another episode of hypoglycemia overnight requiring intervention.  When asked next morning he does not remember any incidents.  Stating that he did took his insulin and preparing his meal and does not remember anything else. Patient lives with his wife and sister who helps with his medications and taking care of him.  Appears to be some noncompliance issues  and medical mismanagement.  He is unable to take care of himself or his medications without any help.  We ordered home health services including RN and aide to keep an eye on his medications to avoid unnecessary hospital visits.  Patient was hypertensive on arrival with some pulmonary edema most likely secondary to hypertensive urgency.  Symptoms resolved with Lasix.  He was restarted on his home meds.  Blood pressure was within goal.  He was also taken for dialysis today according to his schedule days. Has Coreg, metoprolol, lisinopril and Entresto listed in his chart.  Not sure what he was really taking.  Coreg and lisinopril was discontinued due to duplication with metoprolol and Entresto.  He needs to have a close follow-up with PCP and home health nurses to monitor his medications.  He will continue with rest of his home meds and dialysis according to his schedule.  Discharge Diagnoses:  Active Problems:   Hypoglycemia associated with diabetes [facility])  Discharge Instructions  Discharge Instructions    Diet - low sodium heart healthy   Complete by: As directed    Discharge instructions   Complete by: As directed    It was pleasure taking care of you. Please take all of your medications as directed and follow-up with your PCP closely to keep your blood glucose and blood pressure under good control. We ordered some home health services to help you become regular with your medications and avoid unnecessary hospital visits.   Increase activity slowly   Complete by: As directed      Allergies as of 08/25/2019      Reactions   Ferrous Gluconate Nausea And Vomiting      Medication List  STOP taking these medications   carvedilol 6.25 MG tablet Commonly known as: COREG   lisinopril 30 MG tablet Commonly known as: ZESTRIL     TAKE these medications   amLODipine 10 MG tablet Commonly known as: NORVASC Take 1 tablet (10 mg total) by mouth daily.   aspirin 81 MG EC tablet Take 1  tablet (81 mg total) by mouth daily.   Entresto 24-26 MG Generic drug: sacubitril-valsartan Take 1 tablet by mouth 2 (two) times daily.   feeding supplement (NEPRO CARB STEADY) Liqd Take 237 mLs by mouth 3 (three) times daily between meals.   folic acid 1 MG tablet Commonly known as: FOLVITE Take 1 mg by mouth daily.   hydrALAZINE 25 MG tablet Commonly known as: APRESOLINE Take 1 tablet (25 mg total) by mouth every 8 (eight) hours.   hydrocortisone 2.5 % cream Apply 1 application topically 2 (two) times daily as needed (rash).   insulin glargine 100 UNIT/ML injection Commonly known as: LANTUS Inject 0.1 mLs (10 Units total) into the skin daily.   insulin lispro 100 UNIT/ML KwikPen Commonly known as: HumaLOG KwikPen Inject 0-0.09 mLs (0-9 Units total) into the skin 3 (three) times daily with meals. According to sliding scale What changed: how much to take   melatonin 3 MG Tabs tablet Take 1 tablet by mouth at bedtime.   metoprolol tartrate 50 MG tablet Commonly known as: LOPRESSOR Take 1 tablet (50 mg total) by mouth 2 (two) times daily.   multivitamin Tabs tablet Take 1 tablet by mouth at bedtime.   pantoprazole 40 MG tablet Commonly known as: PROTONIX Take 40 mg by mouth daily.   Prosol 20 % Soln   sertraline 100 MG tablet Commonly known as: ZOLOFT Take by mouth.   thiamine 100 MG tablet Take 1 tablet (100 mg total) by mouth daily.       Allergies  Allergen Reactions  . Ferrous Gluconate Nausea And Vomiting    Consultations:  Nephrology  Procedures/Studies: DG Chest Portable 1 View  Result Date: 08/24/2019 CLINICAL DATA:  Hypoxia EXAM: PORTABLE CHEST 1 VIEW COMPARISON:  07/05/2019 FINDINGS: Right-sided central venous catheter with tip over the proximal right atrium. Cardiomegaly with vascular congestion and diffuse interstitial opacity probably representing mild edema. No large effusion. Aortic atherosclerosis. Acute to subacute fracture involving  the right eighth lateral rib. IMPRESSION: Cardiomegaly with vascular congestion and mild diffuse interstitial opacity suspect for pulmonary edema. Acute to subacute appearing right eighth rib fracture Electronically Signed   By: Donavan Foil M.D.   On: 08/24/2019 19:36     Subjective: Patient was feeling better when seen today.  No new complaints.  He does not remember what happened which brought him to the hospital.  Stating he took his insulin and then preparing his meal and could not remember anything after that. We discussed that he should be having meals in front of him before using insulin to avoid unnecessary hypoglycemia.  Denies any extra dose of insulin.  Discharge Exam: Vitals:   08/25/19 1147 08/25/19 1151  BP: 138/86 (!) 141/80  Pulse: 65 61  Resp: 12   Temp:    SpO2: 98% 99%   Vitals:   08/25/19 0615 08/25/19 0741 08/25/19 1147 08/25/19 1151  BP: (!) 158/102 (!) 161/96 138/86 (!) 141/80  Pulse: 73 70 65 61  Resp: 18  12   Temp: 98.6 F (37 C) 99.2 F (37.3 C)    TempSrc: Oral Oral    SpO2: 98% 94% 98% 99%  Weight:      Height:        General: Pt is alert, awake, not in acute distress Cardiovascular: RRR, S1/S2 +, no rubs, no gallops Respiratory: CTA bilaterally, no wheezing, no rhonchi Abdominal: Soft, NT, ND, bowel sounds + Extremities: no edema, no cyanosis   The results of significant diagnostics from this hospitalization (including imaging, microbiology, ancillary and laboratory) are listed below for reference.    Microbiology: Recent Results (from the past 240 hour(s))  Respiratory Panel by RT PCR (Flu A&B, Covid) - Nasopharyngeal Swab     Status: None   Collection Time: 08/24/19  6:13 PM   Specimen: Nasopharyngeal Swab  Result Value Ref Range Status   SARS Coronavirus 2 by RT PCR NEGATIVE NEGATIVE Final    Comment: (NOTE) SARS-CoV-2 target nucleic acids are NOT DETECTED. The SARS-CoV-2 RNA is generally detectable in upper respiratoy specimens  during the acute phase of infection. The lowest concentration of SARS-CoV-2 viral copies this assay can detect is 131 copies/mL. A negative result does not preclude SARS-Cov-2 infection and should not be used as the sole basis for treatment or other patient management decisions. A negative result may occur with  improper specimen collection/handling, submission of specimen other than nasopharyngeal swab, presence of viral mutation(s) within the areas targeted by this assay, and inadequate number of viral copies (<131 copies/mL). A negative result must be combined with clinical observations, patient history, and epidemiological information. The expected result is Negative. Fact Sheet for Patients:  PinkCheek.be Fact Sheet for Healthcare Providers:  GravelBags.it This test is not yet ap proved or cleared by the [place] FDA and  has been authorized for detection and/or diagnosis of SARS-CoV-2 by FDA under an Emergency Use Authorization (EUA). This EUA will remain  in effect (meaning this test can be used) for the duration of the COVID-19 declaration under Section 564(b)(1) of the Act, 21 U.S.C. section 360bbb-3(b)(1), unless the authorization is terminated or revoked sooner.    Influenza A by PCR NEGATIVE NEGATIVE Final   Influenza B by PCR NEGATIVE NEGATIVE Final    Comment: (NOTE) The Xpert Xpress SARS-CoV-2/FLU/RSV assay is intended as an aid in  the diagnosis of influenza from Nasopharyngeal swab specimens and  should not be used as a sole basis for treatment. Nasal washings and  aspirates are unacceptable for Xpert Xpress SARS-CoV-2/FLU/RSV  testing. Fact Sheet for Patients: PinkCheek.be Fact Sheet for Healthcare Providers: GravelBags.it This test is not yet approved or cleared by the [place] FDA and  has been authorized for detection and/or diagnosis of  SARS-CoV-2 by  FDA under an Emergency Use Authorization (EUA). This EUA will remain  in effect (meaning this test can be used) for the duration of the  Covid-19 declaration under Section 564(b)(1) of the Act, 21  U.S.C. section 360bbb-3(b)(1), unless the authorization is  terminated or revoked. Performed at [facility], [address] [place], [place] [address]      Labs: BNP (last 3 results) Recent Labs    07/05/19 2156  BNP 8,832.5*   Basic Metabolic Panel: Recent Labs  Lab 08/24/19 1813 08/25/19 0708  NA 131* 134*  K 3.9 3.2*  CL 99 103  CO2 21* 23  GLUCOSE 254* 150*  BUN 25* 29*  CREATININE 3.94* 4.30*  CALCIUM 8.6* 8.1*   Liver Function Tests: No results for input(s): AST, ALT, ALKPHOS, BILITOT, PROT, ALBUMIN in the last 168 hours. No results for input(s): LIPASE, AMYLASE in the last 168 hours. No results  for input(s): AMMONIA in the last 168 hours. CBC: Recent Labs  Lab 08/24/19 1813 08/25/19 0708  WBC 5.6 7.5  NEUTROABS 3.5  --   HGB 15.1 11.3*  HCT 46.9 33.0*  MCV 90.7 86.4  PLT 333 256   Cardiac Enzymes: No results for input(s): CKTOTAL, CKMB, CKMBINDEX, TROPONINI in the last 168 hours. BNP: Invalid input(s): POCBNP CBG: Recent Labs  Lab 08/25/19 0426 08/25/19 0514 08/25/19 0615 08/25/19 0745 08/25/19 1021  GLUCAP 53* 183* 145* 150* 250*   D-Dimer No results for input(s): DDIMER in the last 72 hours. Hgb A1c No results for input(s): HGBA1C in the last 72 hours. Lipid Profile No results for input(s): CHOL, HDL, LDLCALC, TRIG, CHOLHDL, LDLDIRECT in the last 72 hours. Thyroid function studies Recent Labs    08/24/19 1813  TSH 3.657   Anemia work up No results for input(s): VITAMINB12, FOLATE, FERRITIN, TIBC, IRON, RETICCTPCT in the last 72 hours. Urinalysis    Component Value Date/Time   COLORURINE YELLOW (A) 07/06/2019 0144   APPEARANCEUR CLOUDY (A) 07/06/2019 0144   APPEARANCEUR Clear 01/17/2014 1551   LABSPEC 1.017  07/06/2019 0144   LABSPEC 1.011 01/17/2014 1551   PHURINE 7.0 07/06/2019 0144   GLUCOSEU >=500 (A) 07/06/2019 0144   GLUCOSEU Negative 01/17/2014 1551   HGBUR SMALL (A) 07/06/2019 0144   BILIRUBINUR NEGATIVE 07/06/2019 0144   BILIRUBINUR Negative 01/17/2014 1551   KETONESUR NEGATIVE 07/06/2019 0144   PROTEINUR >=300 (A) 07/06/2019 0144   NITRITE NEGATIVE 07/06/2019 0144   LEUKOCYTESUR NEGATIVE 07/06/2019 0144   LEUKOCYTESUR 1+ 01/17/2014 1551   Sepsis Labs Invalid input(s): PROCALCITONIN,  WBC,  LACTICIDVEN Microbiology Recent Results (from the past 240 hour(s))  Respiratory Panel by RT PCR (Flu A&B, Covid) - Nasopharyngeal Swab     Status: None   Collection Time: 08/24/19  6:13 PM   Specimen: Nasopharyngeal Swab  Result Value Ref Range Status   SARS Coronavirus 2 by RT PCR NEGATIVE NEGATIVE Final    Comment: (NOTE) SARS-CoV-2 target nucleic acids are NOT DETECTED. The SARS-CoV-2 RNA is generally detectable in upper respiratoy specimens during the acute phase of infection. The lowest concentration of SARS-CoV-2 viral copies this assay can detect is 131 copies/mL. A negative result does not preclude SARS-Cov-2 infection and should not be used as the sole basis for treatment or other patient management decisions. A negative result may occur with  improper specimen collection/handling, submission of specimen other than nasopharyngeal swab, presence of viral mutation(s) within the areas targeted by this assay, and inadequate number of viral copies (<131 copies/mL). A negative result must be combined with clinical observations, patient history, and epidemiological information. The expected result is Negative. Fact Sheet for Patients:  PinkCheek.be Fact Sheet for Healthcare Providers:  GravelBags.it This test is not yet ap proved or cleared by the [place] FDA and  has been authorized for detection and/or diagnosis of  SARS-CoV-2 by FDA under an Emergency Use Authorization (EUA). This EUA will remain  in effect (meaning this test can be used) for the duration of the COVID-19 declaration under Section 564(b)(1) of the Act, 21 U.S.C. section 360bbb-3(b)(1), unless the authorization is terminated or revoked sooner.    Influenza A by PCR NEGATIVE NEGATIVE Final   Influenza B by PCR NEGATIVE NEGATIVE Final    Comment: (NOTE) The Xpert Xpress SARS-CoV-2/FLU/RSV assay is intended as an aid in  the diagnosis of influenza from Nasopharyngeal swab specimens and  should not be used as a sole basis for treatment. Nasal  washings and  aspirates are unacceptable for Xpert Xpress SARS-CoV-2/FLU/RSV  testing. Fact Sheet for Patients: PinkCheek.be Fact Sheet for Healthcare Providers: GravelBags.it This test is not yet approved or cleared by the [place] FDA and  has been authorized for detection and/or diagnosis of SARS-CoV-2 by  FDA under an Emergency Use Authorization (EUA). This EUA will remain  in effect (meaning this test can be used) for the duration of the  Covid-19 declaration under Section 564(b)(1) of the Act, 21  U.S.C. section 360bbb-3(b)(1), unless the authorization is  terminated or revoked. Performed at [facility], [address] [place], [place] [address]     Time coordinating discharge: Over 30 minutes  SIGNED:  Lorella Nimrod, MD  [facility] 08/25/2019, 12:08 PM  If 7PM-7AM, please contact night-coverage [email or web address]  This record has been created using Systems analyst. Errors have been sought and corrected,but may not always be located. Such creation errors do not reflect on the standard of care.

## 2019-08-25 NOTE — Progress Notes (Signed)
HD started. 

## 2019-08-25 NOTE — ED Notes (Signed)
Pt incont mod amount soft/liquid brown stool; pt clensed, attends change; pt assisted into hosp gown, yellow slipper socks/bracelet and repos in bed for comfort; pt currently drinking 8oz OJ for FSBS 53; message sent to Sharion Settler, NP regarding such

## 2019-08-25 NOTE — Evaluation (Signed)
Occupational Therapy Evaluation Patient Details Name: [name] MRN: [id number] DOB: Jul 17, 1960 Today's Date: 08/25/2019    History of Present Illness 59 y.o. male found on the floor by family with altered mental status.  Medical history significant for  ESRD on HD Tu/Thurs/Sat, Type 2 diabetes with recurrent hypoglycemia and hyperglycemia crisis, likely due to non compliance,CVA w/ right sided weakness, chronic bilateral ICA occlusion, hypertension and hyperlipidemia.  He also has a history of alcohol abuse and dependence    Clinical Impression   Pt was seen for OT evaluation this date. Prior to hospital admission, pt was requiring increasing assist for ADL, particularly LB ADL and was having increasing difficulty with medication mgt (wife sets up using medication pill box per pt). Pt lives with his spouse who works 7 days/wk so has limited ability to assist the pt. Pt unable to drive, perform meal prep, and housekeeping tasks. Currently pt demonstrates impairments as described below (See OT problem list) which functionally limit his ability to perform ADL/self-care tasks. Pt currently requires mod assist for LB ADL tasks.  Pt would benefit from skilled OT to address noted impairments and functional limitations (see below for any additional details) in order to maximize safety and independence while minimizing falls risk and caregiver burden. Upon hospital discharge, recommend HHOT to maximize pt safety and return to functional independence during meaningful occupations of daily life.     Follow Up Recommendations  Home health OT    Equipment Recommendations  3 in 1 bedside commode    Recommendations for Other Services       Precautions / Restrictions Precautions Precautions: Fall Restrictions Weight Bearing Restrictions: No      Mobility Bed Mobility Overal bed mobility: Modified Independent             General bed mobility comments: Pt able to get to sitting w/o direct  assist  Transfers Overall transfer level: Modified independent Equipment used: Rolling walker (2 wheeled)             General transfer comment: Pt was able to rise to standing w/o hesitation, minimal reliance on the walker    Balance Overall balance assessment: Needs assistance   Sitting balance-Leahy Scale: Good Sitting balance - Comments: good safety and confidence sitting EOB   Standing balance support: Bilateral upper extremity supported Standing balance-Leahy Scale: Fair Standing balance comment: pt with occasional unsteadiness and 1 small LOB in the first few steps of walking.  Reliant on the walker during dynamic tasks.                           ADL either performed or assessed with clinical judgement   ADL Overall ADL's : Needs assistance/impaired                                       General ADL Comments: Pt requires mod assist for LB ADL, generally able to manage UB ADL     Vision Baseline Vision/History: (pt reports he needs glasses but doesn't have any) Patient Visual Report: No change from baseline       Perception     Praxis      Pertinent Vitals/Pain Pain Assessment: No/denies pain     Hand Dominance Left   Extremity/Trunk Assessment Upper Extremity Assessment Upper Extremity Assessment: Generalized weakness(baseline R weakness, functional t/o)   Lower Extremity Assessment Lower  Extremity Assessment: Generalized weakness(baseline R weakness, functional t/o)       Communication Communication Communication: No difficulties   Cognition Arousal/Alertness: Awake/alert Behavior During Therapy: WFL for tasks assessed/performed Overall Cognitive Status: Within Functional Limits for tasks assessed                                     General Comments       Exercises     Shoulder Instructions      Home Living Family/patient expects to be discharged to:: Private residence Living Arrangements:  Spouse/significant other Available Help at Discharge: Family;Available PRN/intermittently(sister also stops by QD) Type of Home: House Home Access: Level entry     Home Layout: One level     Bathroom Shower/Tub: Occupational psychologist: Standard Bathroom Accessibility: Yes   Home Equipment: Grab bars - tub/shower;Grab bars - toilet;Cane - single point;Walker - 2 wheels          Prior Functioning/Environment Level of Independence: Needs assistance  Gait / Transfers Assistance Needed: Pt endorses using RW in the home, however, he also endorses multiple falls - wife and sister help with all meals ADL's / Homemaking Assistance Needed: Pt reports he needs significant assist for LB ADL tasks from spouse and unable to perform meal prep, housekeeping tasks, and has difficulty with medication mgt (wife puts in weekly pill box but pt still has trouble)            OT Problem List: Decreased strength;Decreased knowledge of use of DME or AE;Impaired balance (sitting and/or standing)      OT Treatment/Interventions: Self-care/ADL training;Therapeutic exercise;Therapeutic activities;DME and/or AE instruction;Patient/family education;Balance training    OT Goals(Current goals can be found in the care plan section) Acute Rehab OT Goals Patient Stated Goal: go home ASAP OT Goal Formulation: With patient Time For Goal Achievement: 09/08/19 Potential to Achieve Goals: Good ADL Goals Pt Will Perform Lower Body Dressing: with mod assist;sit to/from stand Pt Will Transfer to Toilet: with supervision(BSC over toilet)  OT Frequency: Min 1X/week   Barriers to D/C: Decreased caregiver support          Co-evaluation              AM-PAC OT "6 Clicks" Daily Activity     Outcome Measure Help from another person eating meals?: None Help from another person taking care of personal grooming?: None Help from another person toileting, which includes using toliet, bedpan, or urinal?:  A Little Help from another person bathing (including washing, rinsing, drying)?: A Lot Help from another person to put on and taking off regular upper body clothing?: None Help from another person to put on and taking off regular lower body clothing?: A Lot 6 Click Score: 19   End of Session    Activity Tolerance: Patient tolerated treatment well Patient left: in bed;with call bell/phone within reach;with bed alarm set  OT Visit Diagnosis: Other abnormalities of gait and mobility (R26.89);Muscle weakness (generalized) (M62.81)                Time: 5366-4403 OT Time Calculation (min): 10 min Charges:  OT General Charges $OT Visit: 1 Visit OT Evaluation $OT Eval Moderate Complexity: 1 Mod  Jeni Salles, MPH, MS, OTR/L ascom [phone] 08/25/19, 1:33 PM

## 2019-08-25 NOTE — Progress Notes (Signed)
/  additional hypoglycemic event. Patient treated with D50 and started on D10 at 30. Serial cbg every 2 hours

## 2019-08-25 NOTE — Progress Notes (Signed)
Patient is ready for discharge. Patient is stable and had HD without issues. Patient has all belongings packed and with him (shirt/socks and wearing wedding ring and gold necklace). IV removed. Patient is discharging home with H/H services for therapy. Patient wheeled out to private car via [facility] picked up by sister. Writer went over discharge papers and he verbalized understanding.

## 2019-08-25 NOTE — Progress Notes (Signed)
Post HD  

## 2019-08-25 NOTE — Evaluation (Signed)
Physical Therapy Evaluation Patient Details Name: [name] MRN: [id number] DOB: 14-Jan-1961 Today's Date: 08/25/2019   History of Present Illness  59 y.o. male found on the floor by family with altered mental status.  Medical history significant for  ESRD on HD Tu/Thurs/Sat, Type 2 diabetes with recurrent hypoglycemia and hyperglycemia crisis, likely due to non compliance,CVA w/ right sided weakness, chronic bilateral ICA occlusion, hypertension and hyperlipidemia.  He also has a history of alcohol abuse and dependence   Clinical Impression  Pt showed good confidence and safety with bed mobility and transfer to standing, however he did have a LOBs (significant lean to the R in the first few steps of ambulation - similar noted in PT notes on previous admissions) though he did not need heavy assist to maintain upright.  Cuing t/o ambulation to insure appropriate and even [place] of UEs on walker, pt did not have any other LOBs but did show some general unsteadiness with no overt safety concerns.  Pt does not feel too far from his baseline and is eager to go home as soon as possible.  Overall good effort, should be able to transition back home safely with the DME and assistance that he currently has, suggest continued PT likely in the home initially for safety.    Follow Up Recommendations Home health PT;Outpatient PT(per progress and availability of transportation)    Equipment Recommendations  None recommended by PT    Recommendations for Other Services       Precautions / Restrictions Precautions Precautions: Fall Restrictions Weight Bearing Restrictions: No      Mobility  Bed Mobility Overal bed mobility: Modified Independent             General bed mobility comments: Pt able to get to sitting w/o direct assist  Transfers Overall transfer level: Modified independent Equipment used: Rolling walker (2 wheeled)             General transfer comment: Pt was able to rise to  standing w/o hesitation, minimal reliance on the walker  Ambulation/Gait Ambulation/Gait assistance: Min guard Gait Distance (Feet): 200 Feet Assistive device: Rolling walker (2 wheeled)       General Gait Details: Pt with a loss of balance to the R (L side of walker off ground) that pt did give min assist to insure he maintained upright.  No overt LOBs the remainder of the effort; frequent cuing to insure even UE weight on walker and to encourage speed that is safe and managable.  Stairs            Wheelchair Mobility    Modified Rankin (Stroke Patients Only)       Balance Overall balance assessment: Needs assistance   Sitting balance-Leahy Scale: Good Sitting balance - Comments: good safety and confidence sitting EOB   Standing balance support: Bilateral upper extremity supported Standing balance-Leahy Scale: Fair Standing balance comment: pt with occasional unsteadiness and 1 small LOB in the first few steps of walking.  Reliant on the walker during dynamic tasks.                             Pertinent Vitals/Pain Pain Assessment: No/denies pain    Home Living Family/patient expects to be discharged to:: Private residence Living Arrangements: Spouse/significant other Available Help at Discharge: Family;Available PRN/intermittently(sister also stops by QD) Type of Home: House Home Access: Level entry     Home Layout: One level Home Equipment: Grab  bars - tub/shower;Grab bars - toilet;Cane - single point;Walker - 2 wheels      Prior Function Level of Independence: Needs assistance   Gait / Transfers Assistance Needed: Pt endorses using RW in the home, however, he also endorses multiple falls - wife and sister help with all meals           Hand Dominance        Extremity/Trunk Assessment   Upper Extremity Assessment Upper Extremity Assessment: Generalized weakness(baseline R weakness, functional t/o)    Lower Extremity Assessment Lower  Extremity Assessment: Generalized weakness(functional t/o)       Communication   Communication: No difficulties  Cognition Arousal/Alertness: Awake/alert Behavior During Therapy: WFL for tasks assessed/performed Overall Cognitive Status: Within Functional Limits for tasks assessed                                        General Comments      Exercises     Assessment/Plan    PT Assessment Patient needs continued PT services  PT Problem List Decreased strength;Decreased range of motion;Decreased activity tolerance;Decreased balance;Decreased mobility;Decreased knowledge of use of DME;Decreased safety awareness;Decreased coordination       PT Treatment Interventions DME instruction;Gait training;Functional mobility training;Therapeutic activities;Balance training;Therapeutic exercise;Neuromuscular re-education;Patient/family education    PT Goals (Current goals can be found in the Care Plan section)  Acute Rehab PT Goals Patient Stated Goal: go home ASAP PT Goal Formulation: With patient Time For Goal Achievement: 09/08/19 Potential to Achieve Goals: Good    Frequency Min 2X/week   Barriers to discharge        Co-evaluation               AM-PAC PT "6 Clicks" Mobility  Outcome Measure Help needed turning from your back to your side while in a flat bed without using bedrails?: None Help needed moving from lying on your back to sitting on the side of a flat bed without using bedrails?: None Help needed moving to and from a bed to a chair (including a wheelchair)?: None Help needed standing up from a chair using your arms (e.g., wheelchair or bedside chair)?: None Help needed to walk in hospital room?: A Little Help needed climbing 3-5 steps with a railing? : A Little 6 Click Score: 22    End of Session Equipment Utilized During Treatment: Gait belt Activity Tolerance: Patient tolerated treatment well Patient left: with call bell/phone within  reach;with chair alarm set Nurse Communication: Mobility status PT Visit Diagnosis: Muscle weakness (generalized) (M62.81);Difficulty in walking, not elsewhere classified (R26.2);Unsteadiness on feet (R26.81)    Time: 0998-3382 PT Time Calculation (min) (ACUTE ONLY): 21 min   Charges:   PT Evaluation $PT Eval Low Complexity: 1 Low PT Treatments $Gait Training: 8-22 mins        Kreg Shropshire, DPT 08/25/2019, 11:50 AM

## 2019-08-25 NOTE — Progress Notes (Signed)
Inpatient Diabetes Program Recommendations  AACE/ADA: New Consensus Statement on Inpatient Glycemic Control  Target Ranges:  Prepandial:   less than 140 mg/dL      Peak postprandial:   less than 180 mg/dL (1-2 hours)      Critically ill patients:  140 - 180 mg/dL   Results for [name], [name] (MRN [id number]) as of 08/25/2019 10:44  Ref. Range 08/24/2019 18:20 08/24/2019 19:01 08/24/2019 20:29 08/24/2019 23:18 08/25/2019 04:26 08/25/2019 05:14 08/25/2019 06:15 08/25/2019 07:45 08/25/2019 10:21  Glucose-Capillary Latest Ref Range: 70 - 99 mg/dL 95 71 137 (H) 116 (H) 53 (L) 183 (H) 145 (H) 150 (H) 250 (H)  Results for [name], [name] (MRN [id number]) as of 08/25/2019 10:44  Ref. Range 03/04/2019 21:12 05/28/2019 03:00  Hemoglobin A1C Latest Ref Range: 4.8 - 5.6 % 6.9 (H) 12.3 (H)   Review of Glycemic Control  Diabetes history: DM2 Outpatient Diabetes medications: Lantus 10 units daily, Humalog 0-15 units TID with meals Current orders for Inpatient glycemic control: Novolog 0-6 units Q4H  Inpatient Diabetes Program Recommendations:   Outpatient DM medications: Will likely need to decrease outpatient Humalog scale(would recommend using Humalog 0-6 units TID (same scale as currently ordered) and may also need to adjust outpatient Lantus dose to prevent further issues with hypoglycemia.  NOTE: Noted consult; chart reviewed. Per chart, patient was inpatient 07/06/19-07/08/19 and when discharged on 07/08/19 was prescribed Lantus 10 units daily, Humalog 0-9 units TID with meals per sliding scale. Patient was seen by Inpatient Diabetes Coordinator on 07/08/19 and coordinator also spoke with patient's wife over the phone on 07/08/19.  Patient was found down by family and presented to the hospital on 08/24/19 via EMS. Per notes, glucose was unreadable (low) when EMS arrived and patient was treated with D10. Patient continued to experience hypoglycemia several times since arriving at the hospital and current glucose up to 250  mg/dl at 10:21 am today and Novolog 2 units given for correction. Agree with current orders for glycemic control. Will likely need to decrease outpatient Humalog scale (would recommend using Humalog 0-6 units TID (same scale as currently ordered) and may also need to adjust outpatient Lantus dose to prevent further issues with hypoglycemia. Will continue to follow while inpatient.  Thanks, Barnie Alderman, RN, MSN, CDE Diabetes Coordinator Inpatient Diabetes Program [phone] (Team Pager from 8am to 5pm)

## 2019-08-25 NOTE — Progress Notes (Signed)
[facility], [place] 08/25/19  Subjective:   LOS: 0 No intake/output data recorded.  Patient known to our practice from outpatient dialysis. States that he became unresponsive at home due to low blood sugar and had to have resuscitation At present he is able to eat a normal meal and feels like he is back to baseline.  He does not remember the events from yesterday completely. Denies any acute complaints   Objective:  Vital signs in last 24 hours:  Temp:  [98.6 F (37 C)-99.2 F (37.3 C)] 99.2 F (37.3 C) (04/13 0741) Pulse Rate:  [66-79] 70 (04/13 0741) Resp:  [12-20] 18 (04/13 0615) BP: (158-206)/(85-134) 161/96 (04/13 0741) SpO2:  [82 %-98 %] 94 % (04/13 0741) Weight:  [65.8 kg] 65.8 kg (04/12 1841)  Weight change:  Filed Weights   08/24/19 1841  Weight: 65.8 kg    Intake/Output:    Intake/Output Summary (Last 24 hours) at 08/25/2019 1052 Last data filed at 08/25/2019 1000 Gross per 24 hour  Intake --  Output 100 ml  Net -100 ml     Physical Exam: General:  No acute distress, laying in the bed  HEENT  anicteric, moist oral mucous membranes  Pulm/lungs  normal breathing effort, clear to auscultation  CVS/Heart  no rub or gallop  Abdomen:   Soft, nontender  Extremities:  Trace edema  Neurologic:  Alert, able to answer questions appropriately  Skin:  No acute rashes   Access:  PermCath right IJ       Basic Metabolic Panel:  Recent Labs  Lab 08/24/19 1813 08/25/19 0708  NA 131* 134*  K 3.9 3.2*  CL 99 103  CO2 21* 23  GLUCOSE 254* 150*  BUN 25* 29*  CREATININE 3.94* 4.30*  CALCIUM 8.6* 8.1*     CBC: Recent Labs  Lab 08/24/19 1813 08/25/19 0708  WBC 5.6 7.5  NEUTROABS 3.5  --   HGB 15.1 11.3*  HCT 46.9 33.0*  MCV 90.7 86.4  PLT 333 256      Lab Results  Component Value Date   HEPBSAG NON REACTIVE 04/11/2019   HEPBSAB NON REACTIVE 04/10/2019      Microbiology:  Recent Results (from the past 240  hour(s))  Respiratory Panel by RT PCR (Flu A&B, Covid) - Nasopharyngeal Swab     Status: None   Collection Time: 08/24/19  6:13 PM   Specimen: Nasopharyngeal Swab  Result Value Ref Range Status   SARS Coronavirus 2 by RT PCR NEGATIVE NEGATIVE Final    Comment: (NOTE) SARS-CoV-2 target nucleic acids are NOT DETECTED. The SARS-CoV-2 RNA is generally detectable in upper respiratoy specimens during the acute phase of infection. The lowest concentration of SARS-CoV-2 viral copies this assay can detect is 131 copies/mL. A negative result does not preclude SARS-Cov-2 infection and should not be used as the sole basis for treatment or other patient management decisions. A negative result may occur with  improper specimen collection/handling, submission of specimen other than nasopharyngeal swab, presence of viral mutation(s) within the areas targeted by this assay, and inadequate number of viral copies (<131 copies/mL). A negative result must be combined with clinical observations, patient history, and epidemiological information. The expected result is Negative. Fact Sheet for Patients:  PinkCheek.be Fact Sheet for Healthcare Providers:  GravelBags.it This test is not yet ap proved or cleared by the [place] FDA and  has been authorized for detection and/or diagnosis of SARS-CoV-2 by FDA under an Emergency Use Authorization (EUA).  This EUA will remain  in effect (meaning this test can be used) for the duration of the COVID-19 declaration under Section 564(b)(1) of the Act, 21 U.S.C. section 360bbb-3(b)(1), unless the authorization is terminated or revoked sooner.    Influenza A by PCR NEGATIVE NEGATIVE Final   Influenza B by PCR NEGATIVE NEGATIVE Final    Comment: (NOTE) The Xpert Xpress SARS-CoV-2/FLU/RSV assay is intended as an aid in  the diagnosis of influenza from Nasopharyngeal swab specimens and  should not be used as  a sole basis for treatment. Nasal washings and  aspirates are unacceptable for Xpert Xpress SARS-CoV-2/FLU/RSV  testing. Fact Sheet for Patients: PinkCheek.be Fact Sheet for Healthcare Providers: GravelBags.it This test is not yet approved or cleared by the [place] FDA and  has been authorized for detection and/or diagnosis of SARS-CoV-2 by  FDA under an Emergency Use Authorization (EUA). This EUA will remain  in effect (meaning this test can be used) for the duration of the  Covid-19 declaration under Section 564(b)(1) of the Act, 21  U.S.C. section 360bbb-3(b)(1), unless the authorization is  terminated or revoked. Performed at [facility], [address] [place], [place] [address]     Coagulation Studies: No results for input(s): LABPROT, INR in the last 72 hours.  Urinalysis: No results for input(s): COLORURINE, LABSPEC, PHURINE, GLUCOSEU, HGBUR, BILIRUBINUR, KETONESUR, PROTEINUR, UROBILINOGEN, NITRITE, LEUKOCYTESUR in the last 72 hours.  Invalid input(s): APPERANCEUR    Imaging: DG Chest Portable 1 View  Result Date: 08/24/2019 CLINICAL DATA:  Hypoxia EXAM: PORTABLE CHEST 1 VIEW COMPARISON:  07/05/2019 FINDINGS: Right-sided central venous catheter with tip over the proximal right atrium. Cardiomegaly with vascular congestion and diffuse interstitial opacity probably representing mild edema. No large effusion. Aortic atherosclerosis. Acute to subacute fracture involving the right eighth lateral rib. IMPRESSION: Cardiomegaly with vascular congestion and mild diffuse interstitial opacity suspect for pulmonary edema. Acute to subacute appearing right eighth rib fracture Electronically Signed   By: Donavan Foil M.D.   On: 08/24/2019 19:36     Medications:   . dextrose 30 mL/hr at 08/25/19 0455   . amLODipine  10 mg Oral Daily  . aspirin EC  81 mg Oral Daily  . Chlorhexidine Gluconate Cloth  6 each  Topical Daily  . folic acid  1 mg Oral Daily  . heparin  5,000 Units Subcutaneous Q8H  . hydrALAZINE  25 mg Oral Q8H  . insulin aspart  0-6 Units Subcutaneous Q4H  . levETIRAcetam  500 mg Oral BID  . melatonin  2.5 mg Oral QHS  . metoprolol tartrate  50 mg Oral BID  . multivitamin  1 tablet Oral QHS  . pantoprazole  40 mg Oral Daily  . sacubitril-valsartan  1 tablet Oral BID  . sertraline  25 mg Oral QHS  . thiamine  100 mg Oral Daily   hydrALAZINE  Assessment/ Plan:  59 y.o. male with  end stage renal disease on hemodialysis, insulin-dependent diabetes mellitus type II, hypertension, CVA right-sided weakness, chronic ICA occlusion, hyperlipidemia, EtOH abuse  Active Problems:   Hypoglycemia associated with diabetes ([place])  [facility] TTS [address] RIJ permcath  #Hypoglycemia Insulin education provided by hospitalist team  #. ESRD Routine hemodialysis treatment today Acceptable for discharge after dialysis  #. Anemia of CKD  Lab Results  Component Value Date   HGB 11.3 (L) 08/25/2019  Continue outpatient management of anemia  #. Diabetes type 2 with CKD Hemoglobin A1C (%)  Date Value  01/17/2014 <  3.5 (L)   Hgb A1c MFr Bld (%)  Date Value  05/28/2019 12.3 (H)  Poorly controlled    LOS: 0 Ben Habermann [name] 4/13/202110:52 [place], [place]

## 2019-08-25 NOTE — Progress Notes (Signed)
HD ended 

## 2019-08-25 NOTE — Progress Notes (Signed)
PRE HD   

## 2019-08-25 NOTE — ED Notes (Signed)
Pt incont large amount liquid brown stool; clensed, attends changed and repos in bed for comfort

## 2019-08-25 NOTE — TOC Transition Note (Signed)
Transition of Care [facility]) - CM/SW Discharge Note   Patient Details  Name: [name] MRN: [id number] Date of Birth: 1961-02-29  [facility]) CM/SW Contact:  Su Hilt, RN Phone Number: 08/25/2019, 10:52 AM   Clinical Narrative:     Met with the patient to discuss DC plan and needs He lives at home with his wife and sister, He has DME at home including a RW and [facility] He is open with [facility], he will be getting RN, PT, OT, Aide and SW The patient has transportation with his family, he is up to date with his PCP, no additional needs  Final next level of care: Home w Home Health Services Barriers to Discharge: Continued Medical Work up   Patient Goals and CMS Choice        Discharge Placement                       Discharge Plan and Services   Discharge Planning Services: CM Consult Post Acute Care Choice: Home Health          DME Arranged: N/A         HH Arranged: RN, PT, OT, Nurse's Aide, Social Work [employer] Agency: [place] ([place]) Date [place]: 08/25/19 Time [place]: [place] Representative spoke with at [place]: [place] ([place]) Interventions     Readmission Risk Interventions Readmission Risk Prevention Plan 07/07/2019 06/09/2019 06/05/2019  Transportation Screening Complete - Complete  PCP or Specialist Appt within 3-5 Days Complete Complete Complete  HRI or [place] Complete Complete Complete  Palliative Care Screening - - Not Applicable  Medication Review (RN Care Manager) Complete - Complete  Some recent data might be hidden
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
## 2019-08-26 LAB — HEMOGLOBIN A1C
Hgb A1c MFr Bld: 12.2 % — ABNORMAL HIGH (ref 4.8–5.6)
Mean Plasma Glucose: 303 mg/dL

## 2019-09-15 ENCOUNTER — Other Ambulatory Visit: Payer: Self-pay

## 2019-09-15 ENCOUNTER — Other Ambulatory Visit: Payer: Self-pay | Admitting: Internal Medicine

## 2019-09-15 ENCOUNTER — Ambulatory Visit
Admission: RE | Admit: 2019-09-15 | Discharge: 2019-09-15 | Disposition: A | Discharge status: Home / Self Care | Payer: Medicare Other | Source: Ambulatory Visit | Attending: Internal Medicine | Admitting: Internal Medicine

## 2019-09-15 ENCOUNTER — Other Ambulatory Visit (HOSPITAL_COMMUNITY): Payer: Self-pay | Admitting: Internal Medicine

## 2019-09-15 DIAGNOSIS — M7989 Other specified soft tissue disorders: Secondary | ICD-10-CM

## 2019-09-15 DIAGNOSIS — E1100 Type 2 diabetes mellitus with hyperosmolarity without nonketotic hyperglycemic-hyperosmolar coma (NKHHC): Secondary | ICD-10-CM | POA: Diagnosis not present

## 2019-09-15 DIAGNOSIS — R739 Hyperglycemia, unspecified: Secondary | ICD-10-CM | POA: Diagnosis not present

## 2019-09-16 ENCOUNTER — Ambulatory Visit: Payer: Medicare Other

## 2019-09-16 ENCOUNTER — Inpatient Hospital Stay
Admission: EM | Admit: 2019-09-16 | Discharge: 2019-09-17 | DRG: 637 | Discharge status: Against Medical Advice | Payer: Medicare Other | Attending: Internal Medicine | Admitting: Internal Medicine

## 2019-09-16 ENCOUNTER — Other Ambulatory Visit: Payer: Self-pay | Admitting: Internal Medicine

## 2019-09-16 ENCOUNTER — Emergency Department: Payer: Medicare Other

## 2019-09-16 DIAGNOSIS — Z5329 Procedure and treatment not carried out because of patient's decision for other reasons: Secondary | ICD-10-CM | POA: Diagnosis present

## 2019-09-16 DIAGNOSIS — E1122 Type 2 diabetes mellitus with diabetic chronic kidney disease: Secondary | ICD-10-CM | POA: Diagnosis present

## 2019-09-16 DIAGNOSIS — Z20822 Contact with and (suspected) exposure to covid-19: Secondary | ICD-10-CM | POA: Diagnosis present

## 2019-09-16 DIAGNOSIS — R739 Hyperglycemia, unspecified: Secondary | ICD-10-CM | POA: Diagnosis present

## 2019-09-16 DIAGNOSIS — F17210 Nicotine dependence, cigarettes, uncomplicated: Secondary | ICD-10-CM | POA: Diagnosis present

## 2019-09-16 DIAGNOSIS — R600 Localized edema: Secondary | ICD-10-CM

## 2019-09-16 DIAGNOSIS — Z794 Long term (current) use of insulin: Secondary | ICD-10-CM | POA: Diagnosis not present

## 2019-09-16 DIAGNOSIS — E1100 Type 2 diabetes mellitus with hyperosmolarity without nonketotic hyperglycemic-hyperosmolar coma (NKHHC): Principal | ICD-10-CM | POA: Diagnosis present

## 2019-09-16 DIAGNOSIS — Z992 Dependence on renal dialysis: Secondary | ICD-10-CM

## 2019-09-16 DIAGNOSIS — F329 Major depressive disorder, single episode, unspecified: Secondary | ICD-10-CM | POA: Diagnosis present

## 2019-09-16 DIAGNOSIS — N2581 Secondary hyperparathyroidism of renal origin: Secondary | ICD-10-CM | POA: Diagnosis present

## 2019-09-16 DIAGNOSIS — Z7982 Long term (current) use of aspirin: Secondary | ICD-10-CM

## 2019-09-16 DIAGNOSIS — I69351 Hemiplegia and hemiparesis following cerebral infarction affecting right dominant side: Secondary | ICD-10-CM | POA: Diagnosis not present

## 2019-09-16 DIAGNOSIS — I10 Essential (primary) hypertension: Secondary | ICD-10-CM

## 2019-09-16 DIAGNOSIS — N186 End stage renal disease: Secondary | ICD-10-CM | POA: Diagnosis present

## 2019-09-16 DIAGNOSIS — E785 Hyperlipidemia, unspecified: Secondary | ICD-10-CM | POA: Diagnosis present

## 2019-09-16 DIAGNOSIS — Z8249 Family history of ischemic heart disease and other diseases of the circulatory system: Secondary | ICD-10-CM

## 2019-09-16 DIAGNOSIS — E118 Type 2 diabetes mellitus with unspecified complications: Secondary | ICD-10-CM | POA: Diagnosis present

## 2019-09-16 DIAGNOSIS — I160 Hypertensive urgency: Secondary | ICD-10-CM | POA: Diagnosis present

## 2019-09-16 DIAGNOSIS — Z841 Family history of disorders of kidney and ureter: Secondary | ICD-10-CM

## 2019-09-16 DIAGNOSIS — E876 Hypokalemia: Secondary | ICD-10-CM | POA: Diagnosis present

## 2019-09-16 DIAGNOSIS — E871 Hypo-osmolality and hyponatremia: Secondary | ICD-10-CM | POA: Diagnosis present

## 2019-09-16 DIAGNOSIS — F1011 Alcohol abuse, in remission: Secondary | ICD-10-CM | POA: Diagnosis present

## 2019-09-16 DIAGNOSIS — I120 Hypertensive chronic kidney disease with stage 5 chronic kidney disease or end stage renal disease: Secondary | ICD-10-CM | POA: Diagnosis present

## 2019-09-16 DIAGNOSIS — E1165 Type 2 diabetes mellitus with hyperglycemia: Secondary | ICD-10-CM

## 2019-09-16 DIAGNOSIS — K219 Gastro-esophageal reflux disease without esophagitis: Secondary | ICD-10-CM | POA: Diagnosis present

## 2019-09-16 DIAGNOSIS — Z888 Allergy status to other drugs, medicaments and biological substances status: Secondary | ICD-10-CM | POA: Diagnosis not present

## 2019-09-16 LAB — BASIC METABOLIC PANEL
Anion gap: 10 (ref 5–15)
BUN: 25 mg/dL — ABNORMAL HIGH (ref 6–20)
CO2: 21 mmol/L — ABNORMAL LOW (ref 22–32)
Calcium: 8.1 mg/dL — ABNORMAL LOW (ref 8.9–10.3)
Chloride: 88 mmol/L — ABNORMAL LOW (ref 98–111)
Creatinine, Ser: 4.22 mg/dL — ABNORMAL HIGH (ref 0.61–1.24)
GFR calc Af Amer: 17 mL/min — ABNORMAL LOW (ref >60)
GFR calc non Af Amer: 14 mL/min — ABNORMAL LOW (ref >60)
Glucose, Bld: 1150 mg/dL (ref 70–99)
Potassium: 4.8 mmol/L (ref 3.5–5.1)
Sodium: 119 mmol/L — CL (ref 135–145)

## 2019-09-16 LAB — CBC
HCT: 43.9 % (ref 39.0–52.0)
Hemoglobin: 13.8 g/dL (ref 13.0–17.0)
MCH: 29.3 pg (ref 26.0–34.0)
MCHC: 31.4 g/dL (ref 30.0–36.0)
MCV: 93.2 fL (ref 80.0–100.0)
Platelets: 189 10*3/uL (ref 150–400)
RBC: 4.71 MIL/uL (ref 4.22–5.81)
RDW: 15 % (ref 11.5–15.5)
WBC: 3.7 10*3/uL — ABNORMAL LOW (ref 4.0–10.5)
nRBC: 0 % (ref 0.0–0.2)

## 2019-09-16 LAB — BLOOD GAS, VENOUS
Acid-base deficit: 10.6 mmol/L — ABNORMAL HIGH (ref 0.0–2.0)
Bicarbonate: 15 mmol/L — ABNORMAL LOW (ref 20.0–28.0)
O2 Saturation: 90.2 %
Patient temperature: 37
pCO2, Ven: 32 mmHg — ABNORMAL LOW (ref 44.0–60.0)
pH, Ven: 7.28 (ref 7.250–7.430)
pO2, Ven: 67 mmHg — ABNORMAL HIGH (ref 32.0–45.0)

## 2019-09-16 LAB — GLUCOSE, CAPILLARY
Glucose-Capillary: >600 mg/dL (ref 70–99)
Glucose-Capillary: >600 mg/dL (ref 70–99)
Glucose-Capillary: >600 mg/dL (ref 70–99)
Glucose-Capillary: >600 mg/dL (ref 70–99)

## 2019-09-16 LAB — BRAIN NATRIURETIC PEPTIDE: B Natriuretic Peptide: 1162 pg/mL — ABNORMAL HIGH (ref 0.0–100.0)

## 2019-09-16 LAB — TROPONIN I (HIGH SENSITIVITY): Troponin I (High Sensitivity): 8 ng/L (ref <18)

## 2019-09-16 LAB — OSMOLALITY
Osmolality: 338 mOsm/kg (ref 275–295)
Osmolality: 320 mOsm/kg — ABNORMAL HIGH (ref 275–295)

## 2019-09-16 LAB — RESPIRATORY PANEL BY RT PCR (FLU A&B, COVID)
Influenza A by PCR: NEGATIVE
Influenza B by PCR: NEGATIVE
SARS Coronavirus 2 by RT PCR: NEGATIVE

## 2019-09-16 MED ORDER — SODIUM CHLORIDE 0.9 % IV BOLUS
1000.0000 mL | Freq: Once | INTRAVENOUS | Status: AC
  Administered 2019-09-16: 1000 mL via INTRAVENOUS

## 2019-09-16 MED ORDER — PANTOPRAZOLE SODIUM 40 MG PO TBEC
40.0000 mg | DELAYED_RELEASE_TABLET | Freq: Every day | ORAL | Status: DC
  Filled 2019-09-16: qty 1

## 2019-09-16 MED ORDER — INSULIN REGULAR(HUMAN) IN NACL 100-0.9 UT/100ML-% IV SOLN
INTRAVENOUS | Status: DC
  Administered 2019-09-16: 21:00:00 8.5 [IU]/h via INTRAVENOUS
  Filled 2019-09-16: qty 100

## 2019-09-16 MED ORDER — POTASSIUM CHLORIDE 10 MEQ/100ML IV SOLN: 10.0000 meq | INTRAVENOUS | Status: DC

## 2019-09-16 MED ORDER — THIAMINE HCL 100 MG PO TABS
100.0000 mg | ORAL_TABLET | Freq: Every day | ORAL | Status: DC
  Filled 2019-09-16: qty 1

## 2019-09-16 MED ORDER — DEXTROSE-NACL 5-0.45 % IV SOLN: INTRAVENOUS | Status: DC

## 2019-09-16 MED ORDER — SACUBITRIL-VALSARTAN 24-26 MG PO TABS: 1.0000 | ORAL_TABLET | Freq: Two times a day (BID) | ORAL | Status: DC

## 2019-09-16 MED ORDER — INSULIN REGULAR(HUMAN) IN NACL 100-0.9 UT/100ML-% IV SOLN: INTRAVENOUS | Status: DC

## 2019-09-16 MED ORDER — METOPROLOL TARTRATE 50 MG PO TABS
50.0000 mg | ORAL_TABLET | Freq: Two times a day (BID) | ORAL | Status: DC
  Administered 2019-09-17: 50 mg via ORAL
  Filled 2019-09-16 (×2): qty 1

## 2019-09-16 MED ORDER — SERTRALINE HCL 50 MG PO TABS
100.0000 mg | ORAL_TABLET | Freq: Every day | ORAL | Status: DC
  Filled 2019-09-16: qty 2

## 2019-09-16 MED ORDER — DEXTROSE 50 % IV SOLN: 0.0000 mL | INTRAVENOUS | Status: DC | PRN

## 2019-09-16 MED ORDER — AMLODIPINE BESYLATE 5 MG PO TABS
10.0000 mg | ORAL_TABLET | Freq: Every day | ORAL | Status: DC
  Filled 2019-09-16: qty 2

## 2019-09-16 MED ORDER — SODIUM CHLORIDE 0.9 % IV SOLN: INTRAVENOUS | Status: DC

## 2019-09-16 MED ORDER — ENOXAPARIN SODIUM 40 MG/0.4ML ~~LOC~~ SOLN: 40.0000 mg | SUBCUTANEOUS | Status: DC

## 2019-09-16 MED ORDER — FOLIC ACID 1 MG PO TABS
1.0000 mg | ORAL_TABLET | Freq: Every day | ORAL | Status: DC
  Filled 2019-09-16: qty 1

## 2019-09-16 MED ORDER — RENA-VITE PO TABS
1.0000 | ORAL_TABLET | Freq: Every day | ORAL | Status: DC
  Filled 2019-09-16 (×2): qty 1

## 2019-09-16 MED ORDER — NEPRO/CARBSTEADY PO LIQD: 237.0000 mL | Freq: Three times a day (TID) | ORAL | Status: DC

## 2019-09-16 MED ORDER — HEPARIN SODIUM (PORCINE) 5000 UNIT/ML IJ SOLN
5000.0000 [IU] | Freq: Three times a day (TID) | INTRAMUSCULAR | Status: DC
  Filled 2019-09-16: qty 1

## 2019-09-16 MED ORDER — HYDRALAZINE HCL 50 MG PO TABS
25.0000 mg | ORAL_TABLET | Freq: Three times a day (TID) | ORAL | Status: DC
  Administered 2019-09-17: 25 mg via ORAL
  Filled 2019-09-16 (×2): qty 1

## 2019-09-16 MED ORDER — ASPIRIN EC 81 MG PO TBEC
81.0000 mg | DELAYED_RELEASE_TABLET | Freq: Every day | ORAL | Status: DC
  Filled 2019-09-16: qty 1

## 2019-09-16 MED ORDER — SODIUM CHLORIDE 0.9 % IV BOLUS
1000.0000 mL | INTRAVENOUS | Status: AC
  Administered 2019-09-16: 1000 mL via INTRAVENOUS

## 2019-09-16 NOTE — ED Provider Notes (Signed)
[facility] Emergency Department Provider Note ____________________________________________   First MD Initiated Contact with Patient 09/16/19 1945     (approximate)  I have reviewed the triage vital signs and the nursing notes.   HISTORY  Chief Complaint Hyperglycemia    HPI [name] is a 59 y.o. male with PMH as noted below including history of ESRD on dialysis and diabetes who presents with hyperglycemia.  He was seen at his doctor's office today and sent over.  The patient states he feels generally well.  He states he had dialysis yesterday.  He denies vomiting or diarrhea, fever chills, weakness, urinary symptoms, or difficulty breathing.  He has some swelling to the right arm which has been present for a few months.  He states he recently had an ultrasound for this.  Past Medical History:  Diagnosis Date  . Chronic kidney disease   . Diabetes mellitus without complication ([place])   . ETOH abuse   . GERD (gastroesophageal reflux disease)   . Hyperlipidemia   . Hypertension   . Stroke [facility])     Patient Active Problem List   Diagnosis Date Noted  . Hypoglycemia associated with diabetes ([place]) 08/24/2019  . Acute respiratory failure with hypoxia ([place])   . Acute metabolic encephalopathy 29/51/8841  . Hypertensive emergency 07/06/2019  . Acute encephalopathy 07/06/2019  . Seizures due to metabolic disorder ([place]) 66/10/3014  . Hyperosmolar hyperglycemic state (HHS) ([place]) 06/05/2019  . Anemia 06/04/2019  . Frequent falls 06/04/2019  . History of CVA (cerebrovascular accident) 06/04/2019  . HCAP (healthcare-associated pneumonia) 06/04/2019  . Acute blood loss anemia 06/04/2019  . Hyperglycemia 05/28/2019  . Fall at home, initial encounter 05/28/2019  . Rib fracture 05/28/2019  . Depression 05/28/2019  . Hypokalemia 05/28/2019  . Weakness 05/27/2019  . ESRD on hemodialysis ([place])   . Acute renal failure (ARF) ([place]) 04/09/2019  . Polyp of  ascending colon   . Diarrhea   . AKI (acute kidney injury) ([place]) 03/04/2019  . Acute kidney injury ([place]) 07/26/2018  . ARF (acute renal failure) ([place]) 07/25/2018  . Bilateral leg numbness 03/19/2018  . Numbness and tingling of both feet 03/19/2018  . CVA (cerebral vascular accident) ([place]) 03/17/2018  . Moderate episode of recurrent major depressive disorder ([place]) 03/11/2018  . UTI (urinary tract infection) 02/27/2018  . Hypoglycemia 01/15/2018  . Type 2 diabetes mellitus without complication, with long-term current use of insulin ([place]) 08/27/2017  . Protein-calorie malnutrition, severe 08/19/2017  . Pancreatitis, acute 08/16/2017  . DKA (diabetic ketoacidoses) ([place]) 08/16/2017  . HTN (hypertension) 08/16/2017  . HLD (hyperlipidemia) 08/16/2017  . Carotid stenosis 08/02/2016  . GERD (gastroesophageal reflux disease) 08/02/2016  . History of esophagogastroduodenoscopy (EGD) 07/01/2016  . History of recent blood transfusion 07/01/2016  . Acute renal failure superimposed on stage 4 chronic kidney disease ([place]) 07/01/2016  . Hyponatremia 07/01/2016  . Uncontrolled diabetes mellitus ([place]) 07/01/2016  . Alcohol abuse 07/01/2016  . Monilial esophagitis ([place]) 07/01/2016  . Tobacco abuse 07/01/2016  . Confusion 06/29/2016  . Iron deficiency anemia 06/29/2016  . Coagulopathy ([place]) 06/29/2016    Past Surgical History:  Procedure Laterality Date  . COLONOSCOPY WITH PROPOFOL N/A 03/06/2019   Procedure: COLONOSCOPY WITH PROPOFOL;  Surgeon: Lucilla Lame, MD;  Location: [facility] ENDOSCOPY;  Service: Endoscopy;  Laterality: N/A;  . DIALYSIS/PERMA CATHETER INSERTION N/A 04/10/2019   Procedure: DIALYSIS/PERMA CATHETER INSERTION;  Surgeon: Serafina Mitchell, MD;  Location: [facility];  Service: Cardiovascular;  Laterality: N/A;  .  ESOPHAGOGASTRODUODENOSCOPY (EGD) WITH PROPOFOL N/A 07/01/2016   Procedure: ESOPHAGOGASTRODUODENOSCOPY (EGD) WITH PROPOFOL;  Surgeon: San Jetty, MD;  Location: [facility]  ENDOSCOPY;  Service: General;  Laterality: N/A;    Prior to Admission medications   Medication Sig Start Date End Date Taking? Authorizing Provider  Amino Acid Infusion (PROSOL) 20 % SOLN  08/22/19   [provider]  amLODipine (NORVASC) 10 MG tablet Take 1 tablet (10 mg total) by mouth daily. 07/09/19   Ezekiel Slocumb, DO  aspirin EC 81 MG EC tablet Take 1 tablet (81 mg total) by mouth daily. 06/10/19   Jennye Boroughs, MD  ENTRESTO 24-26 MG Take 1 tablet by mouth 2 (two) times daily. 07/29/19   [provider]  folic acid (FOLVITE) 1 MG tablet Take 1 mg by mouth daily. 03/12/19   [provider]  hydrALAZINE (APRESOLINE) 25 MG tablet Take 1 tablet (25 mg total) by mouth every 8 (eight) hours. 07/08/19   Ezekiel Slocumb, DO  hydrocortisone 2.5 % cream Apply 1 application topically 2 (two) times daily as needed (rash).  04/08/19   [provider]  insulin glargine (LANTUS) 100 UNIT/ML injection Inject 0.1 mLs (10 Units total) into the skin daily. 06/09/19   Jennye Boroughs, MD  insulin lispro (HUMALOG KWIKPEN) 100 UNIT/ML KwikPen Inject 0-0.09 mLs (0-9 Units total) into the skin 3 (three) times daily with meals. According to sliding scale Patient taking differently: Inject 0-15 Units into the skin 3 (three) times daily with meals. According to sliding scale 07/08/19   Nicole Kindred A, DO  Melatonin 3 MG TABS Take 1 tablet by mouth at bedtime. 05/23/19   [provider]  metoprolol tartrate (LOPRESSOR) 50 MG tablet Take 1 tablet (50 mg total) by mouth 2 (two) times daily. 07/08/19   Ezekiel Slocumb, DO  multivitamin (RENA-VIT) TABS tablet Take 1 tablet by mouth at bedtime. 07/08/19   Ezekiel Slocumb, DO  Nutritional Supplements (FEEDING SUPPLEMENT, NEPRO CARB STEADY,) LIQD Take 237 mLs by mouth 3 (three) times daily between meals. 07/08/19   Ezekiel Slocumb, DO  pantoprazole (PROTONIX) 40 MG tablet Take 40 mg by mouth daily.  06/17/19   [provider]  sertraline (ZOLOFT) 100 MG tablet Take by mouth. 06/17/19   [provider]  thiamine 100 MG tablet Take 1 tablet (100 mg total) by mouth daily. 07/08/19   Ezekiel Slocumb, DO    Allergies Ferrous gluconate  Family History  Problem Relation Age of Onset  . CAD Brother   . Dementia Mother   . Renal Disease Father     Social History Social History   Tobacco Use  . Smoking status: Current Every Day Smoker    Packs/day: 0.25    Years: 5.00    Pack years: 1.25    Types: Cigars  . Smokeless tobacco: Never Used  Substance Use Topics  . Alcohol use: Yes    Alcohol/week: 16.0 standard drinks    Types: 14 Cans of beer, 2 Shots of liquor per week  . Drug use: No    Review of Systems  Constitutional: No fever. Eyes: No redness. ENT: No sore throat. Cardiovascular: Denies chest pain. Respiratory: Denies shortness of breath. Gastrointestinal: No vomiting or diarrhea.  Genitourinary: Negative for dysuria.  Musculoskeletal: Negative for back pain. Skin: Negative for rash. Neurological: Negative for headache.   ____________________________________________   PHYSICAL EXAM:  VITAL SIGNS: ED Triage Vitals [09/16/19 1924]  Enc Vitals Group     BP [name])  164/104     Pulse Rate 78     Resp 18     Temp 98.1 F (36.7 C)     Temp Source Oral     SpO2 97 %     Weight      Height      Head Circumference      Peak Flow      Pain Score      Pain Loc      Pain Edu?      Excl. in [place]?     Constitutional: Alert and oriented.  Chronically ill-appearing but in no acute distress. Eyes: Conjunctivae are normal.  Head: Atraumatic. Nose: No congestion/rhinnorhea. Mouth/Throat: Mucous membranes are slightly dry.   Neck: Normal range of motion.  Cardiovascular: Normal rate, regular rhythm. Grossly normal heart sounds.  Good peripheral circulation. Respiratory: Normal respiratory effort.  No retractions. Lungs CTAB. Gastrointestinal: Soft and nontender. No  distention.  Genitourinary: No flank tenderness. Musculoskeletal: 1+ bilateral lower extremity edema.  Extremities warm and well perfused.  Nonpitting swelling to right distal arm with no tenderness, erythema, or induration. Neurologic:  Normal speech and language. No gross focal neurologic deficits are appreciated.  Skin:  Skin is warm and dry. No rash noted. Psychiatric: Mood and affect are normal. Speech and behavior are normal.  ____________________________________________   LABS (all labs ordered are listed, but only abnormal results are displayed)  Labs Reviewed  BASIC METABOLIC PANEL - Abnormal; Notable for the following components:      Result Value   Sodium 119 (*)    Chloride 88 (*)    CO2 21 (*)    Glucose, Bld 1,150 (*)    BUN 25 (*)    Creatinine, Ser 4.22 (*)    Calcium 8.1 (*)    GFR calc non Af Amer 14 (*)    GFR calc Af Amer 17 (*)    All other components within normal limits  CBC - Abnormal; Notable for the following components:   WBC 3.7 (*)    All other components within normal limits  GLUCOSE, CAPILLARY - Abnormal; Notable for the following components:   Glucose-Capillary >600 (*)    All other components within normal limits  GLUCOSE, CAPILLARY - Abnormal; Notable for the following components:   Glucose-Capillary >600 (*)    All other components within normal limits  RESPIRATORY PANEL BY RT PCR (FLU A&B, COVID)  URINALYSIS, COMPLETE (UACMP) WITH MICROSCOPIC  OSMOLALITY  LACTIC ACID, PLASMA  LACTIC ACID, PLASMA  BLOOD GAS, VENOUS  BRAIN NATRIURETIC PEPTIDE  CBG MONITORING, ED  CBG MONITORING, ED  TROPONIN I (HIGH SENSITIVITY)   ____________________________________________  EKG  ED ECG REPORT I, Arta Silence, the attending physician, personally viewed and interpreted this ECG.  Date: 09/16/2019 EKG Time: 1940 Rate: 79 Rhythm: sinus rhythm with possible ectopic atrial beats QRS Axis: Left axis Intervals: normal ST/T Wave abnormalities:  Nonspecific ST abnormalities Narrative Interpretation: no evidence of acute ischemia  ____________________________________________  RADIOLOGY  CXR: No focal infiltrate or other acute abnormality  ____________________________________________   PROCEDURES  Procedure(s) performed: No  Procedures  Critical Care performed: Yes  CRITICAL CARE Performed by: Arta Silence   Total critical care time: 15 minutes  Critical care time was exclusive of separately billable procedures and treating other patients.  Critical care was necessary to treat or prevent imminent or life-threatening deterioration.  Critical care was time spent personally by me on the following activities: development of treatment plan with patient and/or surrogate as  well as nursing, discussions with consultants, evaluation of patient's response to treatment, examination of patient, obtaining history from patient or surrogate, ordering and performing treatments and interventions, ordering and review of laboratory studies, ordering and review of radiographic studies, pulse oximetry and re-evaluation of patient's condition. ____________________________________________   INITIAL IMPRESSION / ASSESSMENT AND PLAN / ED COURSE  Pertinent labs & imaging results that were available during my care of the patient were reviewed by me and considered in my medical decision making (see chart for details).  59 year old male with a history of ESRD on dialysis, diabetes, and other PMH as noted above presents with hyperglycemia over 600, noted on labs at his doctor's office today.  I reviewed the past medical records in [place].  The patient was admitted last month with hypoglycemia and altered mental status.  He has a history of recurrent hypoglycemia and hyperglycemia likely due to medication noncompliance as well as a CVA with right-sided weakness and a history of alcohol abuse.  On exam, the patient is chronically ill and weak  appearing but in no acute distress.  His vital signs are normal except for hypertension.  He has no increased work of breathing or respiratory distress and his lungs are clear.  The abdomen is soft and nontender.  Mucous membranes are somewhat dry.  He has nonpitting edema to the right arm which is subacute.  Overall presentation is consistent with HHS versus DKA.  The patient has no other focal symptoms, there is no evidence of precipitating infection.  We will start fluids and insulin via Endo tool, obtain labs for further work-up, and plan for admission.  ----------------------------------------- 9:40 PM on 09/16/2019 -----------------------------------------  Lab work-up reveals glucose of over 1000 with no anion gap.  This is consistent with hyperosmolar hyperglycemic state.  The patient is receiving fluids and an insulin infusion.  I discussed the case with hospitalist Dr. Sidney Ace for admission.   ____________________________________________   FINAL CLINICAL IMPRESSION(S) / ED DIAGNOSES  Final diagnoses:  Hyperosmolar hyperglycemic state (HHS) ([place])      NEW MEDICATIONS STARTED DURING THIS VISIT:  New Prescriptions   No medications on file     Note:  This document was prepared using Dragon voice recognition software and may include unintentional dictation errors.    Arta Silence, MD 09/16/19 2140

## 2019-09-16 NOTE — ED Triage Notes (Signed)
Pt seen at PCP and was directed to come straight to ED due to blood sugar reading over 1000 and dehydration. Pt is dialysis [facility].

## 2019-09-16 NOTE — ED Notes (Signed)
See triage note, pt reports being told to come to ED from PCP due to blood sugar being over 1000.  Pt also c/o pain to right arm/swelling noted. States no injury and arm swells regularly.

## 2019-09-16 NOTE — ED Notes (Signed)
Pt reports increasing weakness for 3 months

## 2019-09-16 NOTE — ED Notes (Signed)
Pt reports stroke x1 month ago with residual right sided weakness.

## 2019-09-16 NOTE — ED Notes (Addendum)
Per pt, left arm has been 2 months  Last dialysis 5/4, right arm access

## 2019-09-16 NOTE — ED Notes (Signed)
Pt reports taking "3 ibuprofen" at her work

## 2019-09-16 NOTE — H&P (Addendum)
[place] [facility] [place] NAME: [name]    MR#:  [id number]  DATE OF BIRTH:  09/07/1960  DATE OF ADMISSION:  09/16/2019  PRIMARY CARE PHYSICIAN: [name], MD   REQUESTING/REFERRING PHYSICIAN: Arta Silence, MD CHIEF COMPLAINT:   Chief Complaint  Patient presents with  . Hyperglycemia    HISTORY OF PRESENT ILLNESS:  Sameul Baker  is a 59 y.o. African-American male with a known history of end-stage renal disease on hemodialysis, type 2 diabetes mellitus, dyslipidemia, hypertension and CVA, who presented to the emergency room with acute onset of hyperglycemia.  He was seen at his primary care physician's office and sent to the emergency room.  He was slightly somnolent during my interview with was arousable.  He denies any nausea vomiting or diarrhea or abdominal pain.  No chest pain or dyspnea or cough or wheezing.  He has chronic right hand and wrist swelling.  No fever or chills.  He denied any polyuria and polydipsia.  He denied missing any of his insulin.  Upon presentation to the emergency room, Blood pressure was 164/104 and later 156/90 with otherwise normal vital signs.  Revealed venous blood gas with pH 7.28 with bicarbonate of 15.  CMP revealed sodium of 119 with chloride of 88, potassium 4.8, blood glucose of 1150, BUN of 25 and creatinine 4.22 and anion gap of 10 with a CO2 of 21.  BNP was 1162 and serum osmolality was 338.  CBC showed minimal leukopenia.  COVID-19 PCR is currently pending.  The patient was started on IV insulin drip per Endo tool was given to her a bolus of IV normal saline.  He will be admitted to air stepdown unit bed for further evaluation and management. PAST MEDICAL HISTORY:   Past Medical History:  Diagnosis Date  . Chronic kidney disease   . Diabetes mellitus without complication ([place])   . ETOH abuse   . GERD (gastroesophageal reflux disease)   . Hyperlipidemia   . Hypertension   . Stroke [facility])     PAST  SURGICAL HISTORY:   Past Surgical History:  Procedure Laterality Date  . COLONOSCOPY WITH PROPOFOL N/A 03/06/2019   Procedure: COLONOSCOPY WITH PROPOFOL;  Surgeon: Lucilla Lame, MD;  Location: [facility] ENDOSCOPY;  Service: Endoscopy;  Laterality: N/A;  . DIALYSIS/PERMA CATHETER INSERTION N/A 04/10/2019   Procedure: DIALYSIS/PERMA CATHETER INSERTION;  Surgeon: Serafina Mitchell, MD;  Location: [place] CV LAB;  Service: Cardiovascular;  Laterality: N/A;  . ESOPHAGOGASTRODUODENOSCOPY (EGD) WITH PROPOFOL N/A 07/01/2016   Procedure: ESOPHAGOGASTRODUODENOSCOPY (EGD) WITH PROPOFOL;  Surgeon: San Jetty, MD;  Location: [facility] ENDOSCOPY;  Service: General;  Laterality: N/A;    SOCIAL HISTORY:   Social History   Tobacco Use  . Smoking status: Current Every Day Smoker    Packs/day: 0.25    Years: 5.00    Pack years: 1.25    Types: Cigars  . Smokeless tobacco: Never Used  Substance Use Topics  . Alcohol use: Yes    Alcohol/week: 16.0 standard drinks    Types: 14 Cans of beer, 2 Shots of liquor per week    FAMILY HISTORY:   Family History  Problem Relation Age of Onset  . CAD Brother   . Dementia Mother   . Renal Disease Father     DRUG ALLERGIES:   Allergies  Allergen Reactions  . Ferrous Gluconate Nausea And Vomiting    REVIEW OF SYSTEMS:   ROS As per history of present illness. All pertinent  systems were reviewed above. Constitutional,  HEENT, cardiovascular, respiratory, GI, GU, musculoskeletal, neuro, psychiatric, endocrine,  integumentary and hematologic systems were reviewed and are otherwise  negative/unremarkable except for positive findings mentioned above in the HPI.   MEDICATIONS AT HOME:   Prior to Admission medications   Medication Sig Start Date End Date Taking? Authorizing Provider  Amino Acid Infusion (PROSOL) 20 % SOLN  08/22/19   [provider]  amLODipine (NORVASC) 10 MG tablet Take 1 tablet (10 mg total) by mouth daily. 07/09/19   Ezekiel Slocumb, DO  aspirin EC 81 MG EC tablet Take 1 tablet (81 mg total) by mouth daily. 06/10/19   Jennye Boroughs, MD  ENTRESTO 24-26 MG Take 1 tablet by mouth 2 (two) times daily. 07/29/19   [provider]  folic acid (FOLVITE) 1 MG tablet Take 1 mg by mouth daily. 03/12/19   [provider]  hydrALAZINE (APRESOLINE) 25 MG tablet Take 1 tablet (25 mg total) by mouth every 8 (eight) hours. 07/08/19   Ezekiel Slocumb, DO  hydrocortisone 2.5 % cream Apply 1 application topically 2 (two) times daily as needed (rash).  04/08/19   [provider]  insulin glargine (LANTUS) 100 UNIT/ML injection Inject 0.1 mLs (10 Units total) into the skin daily. 06/09/19   Jennye Boroughs, MD  insulin lispro (HUMALOG KWIKPEN) 100 UNIT/ML KwikPen Inject 0-0.09 mLs (0-9 Units total) into the skin 3 (three) times daily with meals. According to sliding scale Patient taking differently: Inject 0-15 Units into the skin 3 (three) times daily with meals. According to sliding scale 07/08/19   Nicole Kindred A, DO  Melatonin 3 MG TABS Take 1 tablet by mouth at bedtime. 05/23/19   [provider]  metoprolol tartrate (LOPRESSOR) 50 MG tablet Take 1 tablet (50 mg total) by mouth 2 (two) times daily. 07/08/19   Ezekiel Slocumb, DO  multivitamin (RENA-VIT) TABS tablet Take 1 tablet by mouth at bedtime. 07/08/19   Ezekiel Slocumb, DO  Nutritional Supplements (FEEDING SUPPLEMENT, NEPRO CARB STEADY,) LIQD Take 237 mLs by mouth 3 (three) times daily between meals. 07/08/19   Ezekiel Slocumb, DO  pantoprazole (PROTONIX) 40 MG tablet Take 40 mg by mouth daily.  06/17/19   [provider]  sertraline (ZOLOFT) 100 MG tablet Take by mouth. 06/17/19   [provider]  thiamine 100 MG tablet Take 1 tablet (100 mg total) by mouth daily. 07/08/19   Ezekiel Slocumb, DO      VITAL SIGNS:  Blood pressure (!) 156/90, pulse 78, temperature 98.1 F (36.7 C), temperature source Oral, resp. rate 18, SpO2  97 %.  PHYSICAL EXAMINATION:  Physical Exam  GENERAL:  59 y.o.-year-old patient lying in the bed with no acute distress.  EYES: Pupils equal, round, reactive to light and accommodation. No scleral icterus. Extraocular muscles intact.  HEENT: Head atraumatic, normocephalic. Oropharynx and nasopharynx clear.  NECK:  Supple, no jugular venous distention. No thyroid enlargement, no tenderness.  LUNGS: Normal breath sounds bilaterally, no wheezing, rales,rhonchi or crepitation. No use of accessory muscles of respiration.  CARDIOVASCULAR: Regular rate and rhythm, S1, S2 normal. No murmurs, rubs, or gallops.  ABDOMEN: Soft, nondistended, nontender. Bowel sounds present. No organomegaly or mass.  EXTREMITIES: 1+ bilateral lower extremity pitting edema, with no cyanosis, or clubbing.  Right wrist and hand swelling with decreased range of motion secondary to pain. NEUROLOGIC: Cranial nerves II through XII are intact. Muscle strength 5/5 in all extremities. Sensation intact. Gait  not checked.  PSYCHIATRIC: The patient is alert and oriented x 3.  Normal affect and good eye contact. SKIN: No obvious rash, lesion, or ulcer.   LABORATORY PANEL:   CBC Recent Labs  Lab 09/16/19 1935  WBC 3.7*  HGB 13.8  HCT 43.9  PLT 189   ------------------------------------------------------------------------------------------------------------------  Chemistries  Recent Labs  Lab 09/16/19 1935  NA 119*  K 4.8  CL 88*  CO2 21*  GLUCOSE 1,150*  BUN 25*  CREATININE 4.22*  CALCIUM 8.1*   ------------------------------------------------------------------------------------------------------------------  Cardiac Enzymes No results for input(s): TROPONINI in the last 168 hours. ------------------------------------------------------------------------------------------------------------------  RADIOLOGY:  US Venous Img Upper Uni Right(DVT)  Result Date: 09/15/2019 CLINICAL DATA:  Right upper extremity  swelling. EXAM: Right UPPER EXTREMITY VENOUS DOPPLER ULTRASOUND TECHNIQUE: Gray-scale sonography with graded compression, as well as color Doppler and duplex ultrasound were performed to evaluate the upper extremity deep venous system from the level of the subclavian vein and including the jugular, axillary, basilic, radial, ulnar and upper cephalic vein. Spectral Doppler was utilized to evaluate flow at rest and with distal augmentation maneuvers. COMPARISON:  None. FINDINGS: Contralateral Subclavian Vein: Respiratory phasicity is normal and symmetric with the symptomatic side. No evidence of thrombus. Normal compressibility. Internal Jugular Vein: No evidence of thrombus. Normal compressibility, respiratory phasicity and response to augmentation. Subclavian Vein: No evidence of thrombus. Normal compressibility, respiratory phasicity and response to augmentation. Axillary Vein: No evidence of thrombus. Normal compressibility, respiratory phasicity and response to augmentation. Cephalic Vein: Not visualized. Basilic Vein: No evidence of thrombus. Normal compressibility, respiratory phasicity and response to augmentation. Brachial Veins: No evidence of thrombus. Normal compressibility, respiratory phasicity and response to augmentation. Radial Veins: No evidence of thrombus. Normal compressibility, respiratory phasicity and response to augmentation. Ulnar Veins: No evidence of thrombus. Normal compressibility, respiratory phasicity and response to augmentation. Venous Reflux:  None visualized. Other Findings:  None visualized. IMPRESSION: No definite evidence of DVT within the right upper extremity. Electronically Signed   By: Marijo Conception M.D.   On: 09/15/2019 16:29   DG Chest Portable 1 View  Result Date: 09/16/2019 CLINICAL DATA:  End-stage renal disease, weakness, hyperglycemia EXAM: PORTABLE CHEST 1 VIEW COMPARISON:  08/24/2019 FINDINGS: Single frontal view of the chest demonstrates stable right-sided  dialysis catheter. Cardiac silhouette is unchanged. Chronic central vascular congestion without airspace disease, effusion, or pneumothorax. Right posterior eighth rib fracture unchanged. IMPRESSION: 1. Stable chronic central vascular congestion.  No acute process. Electronically Signed   By: Randa Ngo M.D.   On: 09/16/2019 20:28      IMPRESSION AND PLAN:   1.  Uncontrolled type 2 diabetes mellitus with hyperosmolar nonketotic hyperglycemia.  The patient initially had a CO2 of 21 over his ABG showed mild acidosis with bicarbonate of 15.  The patient has hyponatremia likely pseudohyponatremia secondary to hyperglycemia. -The patient will be admitted to a stepdown unit. -He will be continued on IV insulin drip per Endo tool. -Cautious hydration will be provided given his end-stage renal disease on hemodialysis.  2.  End stage renal disease  on hemodialysis on Tuesday Thursday and Saturday. -Nephrology consultation will be obtained for follow-up.  3.  Uncontrolled hypertension -We will continue his antihypertensives. -We will place him on as needed IV labetalol  4.  Depression. -We will continue Zoloft.  5.  GERD. -We will continue PPI therapy.  6.  DVT prophylaxis. -Subcutaneous heparin.  All the records are reviewed and case discussed with ED provider. The plan of care was  discussed in details with the patient (and family). I answered all questions. The patient agreed to proceed with the above mentioned plan. Further management will depend upon hospital course.   CODE STATUS: Full code  Status is: Inpatient  Remains inpatient appropriate because:Persistent severe electrolyte disturbances, Unsafe d/c plan, IV treatments appropriate due to intensity of illness or inability to take PO and Inpatient level of care appropriate due to severity of illness   Dispo: The patient is from: Home              Anticipated d/c is to: Home              Anticipated d/c date is: 2 days               Patient currently is not medically stable to d/c.       TOTAL TIME TAKING CARE OF THIS PATIENT: 55 minutes.    Christel Mormon M.D on 09/16/2019 at 10:24 PM  [facility]   From 7 PM-7 AM, contact night-coverage [email or web address]  CC: Primary care physician; [name], MD   Note: This dictation was prepared with Dragon dictation along with smaller phrase technology. Any transcriptional errors that result from this process are unintentional.

## 2019-09-17 ENCOUNTER — Ambulatory Visit: Payer: Medicare Other

## 2019-09-17 DIAGNOSIS — E1165 Type 2 diabetes mellitus with hyperglycemia: Secondary | ICD-10-CM | POA: Diagnosis not present

## 2019-09-17 DIAGNOSIS — E1100 Type 2 diabetes mellitus with hyperosmolarity without nonketotic hyperglycemic-hyperosmolar coma (NKHHC): Secondary | ICD-10-CM | POA: Diagnosis not present

## 2019-09-17 LAB — GLUCOSE, CAPILLARY
Glucose-Capillary: 109 mg/dL — ABNORMAL HIGH (ref 70–99)
Glucose-Capillary: 185 mg/dL — ABNORMAL HIGH (ref 70–99)
Glucose-Capillary: 290 mg/dL — ABNORMAL HIGH (ref 70–99)
Glucose-Capillary: 367 mg/dL — ABNORMAL HIGH (ref 70–99)
Glucose-Capillary: 476 mg/dL — ABNORMAL HIGH (ref 70–99)
Glucose-Capillary: >600 mg/dL (ref 70–99)

## 2019-09-17 LAB — BASIC METABOLIC PANEL
Anion gap: 10 (ref 5–15)
Anion gap: 7 (ref 5–15)
Anion gap: 6 (ref 5–15)
BUN: 25 mg/dL — ABNORMAL HIGH (ref 6–20)
BUN: 22 mg/dL — ABNORMAL HIGH (ref 6–20)
BUN: 17 mg/dL (ref 6–20)
CO2: 20 mmol/L — ABNORMAL LOW (ref 22–32)
CO2: 19 mmol/L — ABNORMAL LOW (ref 22–32)
CO2: 26 mmol/L (ref 22–32)
Calcium: 7.5 mg/dL — ABNORMAL LOW (ref 8.9–10.3)
Calcium: 6.6 mg/dL — ABNORMAL LOW (ref 8.9–10.3)
Calcium: 7.7 mg/dL — ABNORMAL LOW (ref 8.9–10.3)
Chloride: 94 mmol/L — ABNORMAL LOW (ref 98–111)
Chloride: 107 mmol/L (ref 98–111)
Chloride: 102 mmol/L (ref 98–111)
Creatinine, Ser: 4.25 mg/dL — ABNORMAL HIGH (ref 0.61–1.24)
Creatinine, Ser: 3.82 mg/dL — ABNORMAL HIGH (ref 0.61–1.24)
Creatinine, Ser: 2.83 mg/dL — ABNORMAL HIGH (ref 0.61–1.24)
GFR calc Af Amer: 17 mL/min — ABNORMAL LOW (ref >60)
GFR calc Af Amer: 19 mL/min — ABNORMAL LOW (ref >60)
GFR calc Af Amer: 27 mL/min — ABNORMAL LOW (ref >60)
GFR calc non Af Amer: 14 mL/min — ABNORMAL LOW (ref >60)
GFR calc non Af Amer: 16 mL/min — ABNORMAL LOW (ref >60)
GFR calc non Af Amer: 23 mL/min — ABNORMAL LOW (ref >60)
Glucose, Bld: 900 mg/dL (ref 70–99)
Glucose, Bld: 323 mg/dL — ABNORMAL HIGH (ref 70–99)
Glucose, Bld: 170 mg/dL — ABNORMAL HIGH (ref 70–99)
Potassium: 3.5 mmol/L (ref 3.5–5.1)
Potassium: 2.4 mmol/L — CL (ref 3.5–5.1)
Potassium: 3.2 mmol/L — ABNORMAL LOW (ref 3.5–5.1)
Sodium: 124 mmol/L — ABNORMAL LOW (ref 135–145)
Sodium: 133 mmol/L — ABNORMAL LOW (ref 135–145)
Sodium: 134 mmol/L — ABNORMAL LOW (ref 135–145)

## 2019-09-17 LAB — LACTIC ACID, PLASMA: Lactic Acid, Venous: 3.1 mmol/L (ref 0.5–1.9)

## 2019-09-17 LAB — MAGNESIUM: Magnesium: 1.6 mg/dL — ABNORMAL LOW (ref 1.7–2.4)

## 2019-09-17 MED ORDER — INSULIN GLARGINE 100 UNIT/ML ~~LOC~~ SOLN
10.0000 [IU] | SUBCUTANEOUS | Status: DC
  Filled 2019-09-17: qty 0.1

## 2019-09-17 MED ORDER — INSULIN ASPART 100 UNIT/ML ~~LOC~~ SOLN: 0.0000 [IU] | Freq: Every day | SUBCUTANEOUS | Status: DC

## 2019-09-17 MED ORDER — INSULIN ASPART 100 UNIT/ML ~~LOC~~ SOLN: 3.0000 [IU] | Freq: Three times a day (TID) | SUBCUTANEOUS | Status: DC

## 2019-09-17 MED ORDER — INSULIN ASPART 100 UNIT/ML ~~LOC~~ SOLN
0.0000 [IU] | SUBCUTANEOUS | Status: DC
Start: 2019-09-17 — End: 2019-09-17

## 2019-09-17 MED ORDER — INSULIN ASPART 100 UNIT/ML ~~LOC~~ SOLN: 0.0000 [IU] | Freq: Three times a day (TID) | SUBCUTANEOUS | Status: DC

## 2019-09-17 MED ORDER — POTASSIUM CHLORIDE 10 MEQ/100ML IV SOLN
10.0000 meq | INTRAVENOUS | Status: AC
  Administered 2019-09-17: 10 meq via INTRAVENOUS
  Filled 2019-09-17 (×2): qty 100

## 2019-09-17 MED ORDER — CHLORHEXIDINE GLUCONATE CLOTH 2 % EX PADS
6.0000 | MEDICATED_PAD | Freq: Every day | CUTANEOUS | Status: DC
  Filled 2019-09-17 (×2): qty 6

## 2019-09-17 MED ORDER — CALCIUM CARBONATE 1250 (500 CA) MG PO TABS: 1250.0000 mg | ORAL_TABLET | Freq: Two times a day (BID) | ORAL | Status: DC

## 2019-09-17 MED ORDER — INSULIN GLARGINE 100 UNIT/ML ~~LOC~~ SOLN: 5.0000 [IU] | SUBCUTANEOUS | Status: DC

## 2019-09-17 MED ORDER — POTASSIUM CHLORIDE 20 MEQ PO PACK: 40.0000 meq | PACK | Freq: Once | ORAL | Status: DC

## 2019-09-17 NOTE — ED Notes (Signed)
Per Dr Priscella Mann, d/c endotool and insulin drip

## 2019-09-17 NOTE — ED Notes (Signed)
Pt given [place] sandwich tray at this time per request and okayed per Olen Cordial, Therapist, sports.

## 2019-09-17 NOTE — ED Notes (Signed)
Diet order placed

## 2019-09-17 NOTE — Progress Notes (Addendum)
Inpatient Diabetes Program Recommendations  AACE/ADA: New Consensus Statement on Inpatient Glycemic Control   Target Ranges:  Prepandial:   less than 140 mg/dL      Peak postprandial:   less than 180 mg/dL (1-2 hours)      Critically ill patients:  140 - 180 mg/dL   Results for EROS, MONTOUR (MRN [id number]) as of 09/17/2019 07:56  Ref. Range 09/16/2019 19:34 09/16/2019 21:19 09/16/2019 22:22 09/16/2019 23:17 09/17/2019 00:42 09/17/2019 02:43 09/17/2019 03:41 09/17/2019 04:50 09/17/2019 06:04 09/17/2019 07:35  Glucose-Capillary Latest Ref Range: 70 - 99 mg/dL >600 (HH) >600 (HH) >600 (HH) >600 (HH) >600 (HH) 476 (H) 367 (H) 290 (H) 185 (H) 109 (H)  Results for JAROME, TRULL (MRN [id number]) as of 09/17/2019 07:56  Ref. Range 09/16/2019 19:35  Glucose Latest Ref Range: 70 - 99 mg/dL 1,150 [facility])  Results for RAFAEL, SALWAY (MRN [id number]) as of 09/17/2019 07:56  Ref. Range 05/28/2019 03:00 08/24/2019 18:13  Hemoglobin A1C Latest Ref Range: 4.8 - 5.6 % 12.3 (H) 12.2 (H)   Review of Glycemic Control  Diabetes history: DM2 Outpatient Diabetes medications: Lantus 10 units daily, Humalog 0-9 units TID with meals Current orders for Inpatient glycemic control: IV insulin  Inpatient Diabetes Program Recommendations:    Insulin at Transition from IV to SQ: Once MD is ready to transition from IV to SQ insulin, please consider ordering Lantus 5 units Q24H, Novolog 0-9 units Q4H.   HbgA1C: A1C 12.2% on 08/24/19 indicating an average glucose of 303 mg/dl over the past 2-3 months.  NOTE: In reviewing the chart, noted patient was admitted 08/24/19-08/25/19 with hypoglycemia and was discharged on 08/25/19 on Lantus 10 units daily and Humalog 0-9 units TID with meals. Prior A1C was 12.3% on 05/28/19 and 12.2% on 08/24/19. Will plan to speak with patient today.  Addendum 09/17/19@11 :00-Spoke with patient while he was still in the Emergency Department. Patient lying on stretcher with eyes closed and ball hat over his face. Patient took his  ball cap off his face and put on his head, opened eyes some during the conversation, and engaged in conversation. Patient states that he sees PCP for DM management. Patient states he is taking Lantus 15 units QAM for DM. Patient reports that he is not taking Humalog insulin at all at home.  Inquired about taking Lantus consistently and patient states that he takes Lantus when his glucose is high. Patient initially stated that he did not check glucose consistently; so inquired about how he knows when to take the Lantus if he is not consistently checking glucose. Patient then stated he checked his glucose at home and takes his Lantus if his glucose was elevated. When asked about specific glucose ranges over the past 1-2 weeks, patient stated he did not know what they have been . Patient appeared aggravated (turning head, closing eyes, and some times not answering question at all) with questions being asked. Patient stated he took his insulin like his doctor has told him to. Discussed A1C of 12.2% on 08/24/19 and explained that A1C indicates an average glucose of 303 mg/dl. Also noted that patient was recently in the hospital in April with hypoglycemia. Discussed glucose and A1C targets. Explained to patient that he needs to be consistently checking glucose at home 3-4 times a day and taking insulin consistently as MD prescribes. Patient states he knows what to do to take care of his diabetes and didn't answer any other questions asked.   Thanks, Barnie Alderman, RN, MSN,  CDE Diabetes Coordinator Inpatient Diabetes Program [phone] (Team Pager from 8am to 5pm)

## 2019-09-17 NOTE — Discharge Summary (Signed)
Received page from bedside RN at approximately 1730 this afternoon.  RN stated patient was desiring to leave [facility].  Patient currently under treatment for resolving HHS as well as hypertensive urgency in the setting of stage renal disease on hemodialysis.  Patient successfully underwent hemodialysis today with 2 L ultrafiltration.  Blood pressure still not at goal.  Glycemic control improving.  Patient has not treatment goal for discharge stability.  As such if the wishes to leave it would be AGAINST MEDICAL ADVICE.  I instructed the bedside nurse to assess the patient's decision-making capabilities and that if he understood that he was leaving AMA that he would be allowed to leave.  Patient with multiple medical comorbidities.  He is high risk for decompensation readmission.  Ralene Muskrat MD

## 2019-09-17 NOTE — ED Notes (Signed)
Patient to dialysis.

## 2019-09-17 NOTE — Progress Notes (Signed)
PT TOLERATED hd TX NO ISSUES UFG ACHIEVED 2L CVC WDL

## 2019-09-17 NOTE — ED Notes (Addendum)
Pt provided meal tray by nutrition services- offered to reposition pt- left room to ask medic student to help boost pt- back to room and pt had thrown tray in floor- pt repositioned in bed- pt asking about dialysis and informed pt that he would be getting it later today- pt requesting to order his lunch- offered to bring pt a menu

## 2019-09-17 NOTE — ED Notes (Signed)
Pt resting to leave AMA at this time. This RN spoke with admitting MD Dr. Priscella Mann to make him aware of patient's decision. DR. Priscella Mann stated that was fine, to get patient to sign AMA papers.

## 2019-09-17 NOTE — Progress Notes (Signed)
[place], [place] 09/17/19  Subjective:   Hospital day # 1  Patient known to our practice from outpatient dialysis.  He dialyzes in [facility] on Tuesday Thursday Saturday shift.  Outpatient dry weight of 62 kg Last dialysis on May 4 for 240 minutes.  Postdialysis weight was 64.7 kg.  Patient has large intradialytic weight gains. This time he presented from PCP office for blood sugar over 1000 He was treated with IV insulin drip.  Blood sugars have improved    Objective:  Vital signs in last 24 hours:  Temp:  [98.1 F (36.7 C)] 98.1 F (36.7 C) (05/05 1924) Pulse Rate:  [70-81] 73 (05/06 1141) Resp:  [14-22] 16 (05/06 1141) BP: (146-171)/(83-104) 164/98 (05/06 1141) SpO2:  [91 %-100 %] 98 % (05/06 1141) Weight:  [68.6 kg] 68.6 kg (05/05 2300)  Weight change:  Filed Weights   09/16/19 2300  Weight: 68.6 kg    Intake/Output:    Intake/Output Summary (Last 24 hours) at 09/17/2019 1320 Last data filed at 09/16/2019 2318 Gross per 24 hour  Intake --  Output 200 ml  Net -200 ml     Physical Exam: General:  Lethargic, no acute distress, laying in the bed  HEENT  moist oral mucous membranes  Pulm/lungs  normal breathing effort on room air  CVS/Heart  regular, no rub  Abdomen:   Soft, nontender  Extremities:  2+ pitting edema bilaterally  Neurologic:  Lethargic, able to answer few simple questions  Skin:  No acute rashes  Access:  Right IJ PermCath       Basic Metabolic Panel:  Recent Labs  Lab 09/16/19 1935 09/16/19 2325 09/17/19 0348  NA 119* 124* 133*  K 4.8 3.5 2.4*  CL 88* 94* 107  CO2 21* 20* 19*  GLUCOSE 1,150* 900* 323*  BUN 25* 25* 22*  CREATININE 4.22* 4.25* 3.82*  CALCIUM 8.1* 7.5* 6.6*     CBC: Recent Labs  Lab 09/16/19 1935  WBC 3.7*  HGB 13.8  HCT 43.9  MCV 93.2  PLT 189      Lab Results  Component Value Date   HEPBSAG NON REACTIVE 04/11/2019   HEPBSAB NON REACTIVE 04/10/2019       Microbiology:  Recent Results (from the past 240 hour(s))  Respiratory Panel by RT PCR (Flu A&B, Covid) - Nasopharyngeal Swab     Status: None   Collection Time: 09/16/19 10:20 PM   Specimen: Nasopharyngeal Swab  Result Value Ref Range Status   SARS Coronavirus 2 by RT PCR NEGATIVE NEGATIVE Final    Comment: (NOTE) SARS-CoV-2 target nucleic acids are NOT DETECTED. The SARS-CoV-2 RNA is generally detectable in upper respiratoy specimens during the acute phase of infection. The lowest concentration of SARS-CoV-2 viral copies this assay can detect is 131 copies/mL. A negative result does not preclude SARS-Cov-2 infection and should not be used as the sole basis for treatment or other patient management decisions. A negative result may occur with  improper specimen collection/handling, submission of specimen other than nasopharyngeal swab, presence of viral mutation(s) within the areas targeted by this assay, and inadequate number of viral copies (<131 copies/mL). A negative result must be combined with clinical observations, patient history, and epidemiological information. The expected result is Negative. Fact Sheet for Patients:  PinkCheek.be Fact Sheet for Healthcare Providers:  GravelBags.it This test is not yet ap proved or cleared by the [place] FDA and  has been authorized for detection and/or diagnosis of SARS-CoV-2  by FDA under an Emergency Use Authorization (EUA). This EUA will remain  in effect (meaning this test can be used) for the duration of the COVID-19 declaration under Section 564(b)(1) of the Act, 21 U.S.C. section 360bbb-3(b)(1), unless the authorization is terminated or revoked sooner.    Influenza A by PCR NEGATIVE NEGATIVE Final   Influenza B by PCR NEGATIVE NEGATIVE Final    Comment: (NOTE) The Xpert Xpress SARS-CoV-2/FLU/RSV assay is intended as an aid in  the diagnosis of influenza  from Nasopharyngeal swab specimens and  should not be used as a sole basis for treatment. Nasal washings and  aspirates are unacceptable for Xpert Xpress SARS-CoV-2/FLU/RSV  testing. Fact Sheet for Patients: PinkCheek.be Fact Sheet for Healthcare Providers: GravelBags.it This test is not yet approved or cleared by the [place] FDA and  has been authorized for detection and/or diagnosis of SARS-CoV-2 by  FDA under an Emergency Use Authorization (EUA). This EUA will remain  in effect (meaning this test can be used) for the duration of the  Covid-19 declaration under Section 564(b)(1) of the Act, 21  U.S.C. section 360bbb-3(b)(1), unless the authorization is  terminated or revoked. Performed at [facility], [address] [place],  [address]     Coagulation Studies: No results for input(s): LABPROT, INR in the last 72 hours.  Urinalysis: No results for input(s): COLORURINE, LABSPEC, PHURINE, GLUCOSEU, HGBUR, BILIRUBINUR, KETONESUR, PROTEINUR, UROBILINOGEN, NITRITE, LEUKOCYTESUR in the last 72 hours.  Invalid input(s): APPERANCEUR    Imaging: US Venous Img Upper Uni Right(DVT)  Result Date: 09/15/2019 CLINICAL DATA:  Right upper extremity swelling. EXAM: Right UPPER EXTREMITY VENOUS DOPPLER ULTRASOUND TECHNIQUE: Gray-scale sonography with graded compression, as well as color Doppler and duplex ultrasound were performed to evaluate the upper extremity deep venous system from the level of the subclavian vein and including the jugular, axillary, basilic, radial, ulnar and upper cephalic vein. Spectral Doppler was utilized to evaluate flow at rest and with distal augmentation maneuvers. COMPARISON:  None. FINDINGS: Contralateral Subclavian Vein: Respiratory phasicity is normal and symmetric with the symptomatic side. No evidence of thrombus. Normal compressibility. Internal Jugular Vein: No evidence of thrombus.  Normal compressibility, respiratory phasicity and response to augmentation. Subclavian Vein: No evidence of thrombus. Normal compressibility, respiratory phasicity and response to augmentation. Axillary Vein: No evidence of thrombus. Normal compressibility, respiratory phasicity and response to augmentation. Cephalic Vein: Not visualized. Basilic Vein: No evidence of thrombus. Normal compressibility, respiratory phasicity and response to augmentation. Brachial Veins: No evidence of thrombus. Normal compressibility, respiratory phasicity and response to augmentation. Radial Veins: No evidence of thrombus. Normal compressibility, respiratory phasicity and response to augmentation. Ulnar Veins: No evidence of thrombus. Normal compressibility, respiratory phasicity and response to augmentation. Venous Reflux:  None visualized. Other Findings:  None visualized. IMPRESSION: No definite evidence of DVT within the right upper extremity. Electronically Signed   By: Marijo Conception M.D.   On: 09/15/2019 16:29   DG Chest Portable 1 View  Result Date: 09/16/2019 CLINICAL DATA:  End-stage renal disease, weakness, hyperglycemia EXAM: PORTABLE CHEST 1 VIEW COMPARISON:  08/24/2019 FINDINGS: Single frontal view of the chest demonstrates stable right-sided dialysis catheter. Cardiac silhouette is unchanged. Chronic central vascular congestion without airspace disease, effusion, or pneumothorax. Right posterior eighth rib fracture unchanged. IMPRESSION: 1. Stable chronic central vascular congestion.  No acute process. Electronically Signed   By: Randa Ngo M.D.   On: 09/16/2019 20:28     Medications:   . sodium chloride 75 mL/hr at  09/17/19 0920  . dextrose 5 % and 0.45% NaCl 75 mL/hr at 09/17/19 0613  . insulin Stopped (09/17/19 0737)   . amLODipine  10 mg Oral Daily  . aspirin EC  81 mg Oral Daily  . Chlorhexidine Gluconate Cloth  6 each Topical Q0600  . feeding supplement (NEPRO CARB STEADY)  237 mL Oral TID BM   . folic acid  1 mg Oral Daily  . heparin injection (subcutaneous)  5,000 Units Subcutaneous Q8H  . hydrALAZINE  25 mg Oral Q8H  . insulin aspart  0-15 Units Subcutaneous TID WC  . insulin aspart  0-5 Units Subcutaneous QHS  . insulin aspart  3 Units Subcutaneous TID WC  . insulin glargine  10 Units Subcutaneous Q24H  . metoprolol tartrate  50 mg Oral BID  . multivitamin  1 tablet Oral QHS  . pantoprazole  40 mg Oral Daily  . sacubitril-valsartan  1 tablet Oral BID  . sertraline  100 mg Oral Daily  . thiamine  100 mg Oral Daily   dextrose  Assessment/ Plan:  59 y.o. male with   End-stage renal disease on hemodialysis Diabetes type 2 insulin-dependent Hypertension Stroke with right-sided weakness Chronic ICA occlusion Hyperlipidemia History of alcohol abuse   admitted on 09/16/2019 for Uncontrolled type 2 diabetes mellitus with hyperosmolar nonketotic hyperglycemia (HCC) [E11.00] Hyperosmolar hyperglycemic state (HHS) ([place]) [E11.00, E11.65]  [facility] TTS [address] RIJ permcath  #End-stage renal disease #Lower extremity edema #Diabetes type 2 with CKD, poorly controlled, insulin-dependent #Secondary hyperparathyroidism of renal origin N 25.81  Plan for routine dialysis today with volume removal as tolerated Management of hyperglycemia as per hospitalist team Avoid large volume IV fluids.  May use D10 for hypoglycemia while transitioning to subcu insulin Monitor calcium and phosphorus during admission.  Low calcium noted.  Supplement calcium with vitamin D  hypokalemia noted.  Should improve with liberal diet   LOS: [place] 5/6/20211:20 PM  [place], [place]  Note: This note was prepared with Dragon dictation. Any transcription errors are unintentional

## 2019-09-17 NOTE — Progress Notes (Signed)
PROGRESS NOTE    [name]  TTS:[id number] DOB: [birth year] DOA: 09/16/2019 PCP: [name], MD   Brief Narrative:   59 y.o. African-American male with a known history of end-stage renal disease on hemodialysis, type 2 diabetes mellitus, dyslipidemia, hypertension and CVA, who presented to the emergency room with acute onset of hyperglycemia.  He was seen at his primary care physician's office and sent to the emergency room.  He was slightly somnolent during my interview with was arousable.  He denies any nausea vomiting or diarrhea or abdominal pain.  No chest pain or dyspnea or cough or wheezing.  He has chronic right hand and wrist swelling.  No fever or chills.  He denied any polyuria and polydipsia.  He denied missing any of his insulin.  Upon presentation to the emergency room, Blood pressure was 164/104 and later 156/90 with otherwise normal vital signs.  Revealed venous blood gas with pH 7.28 with bicarbonate of 15.  CMP revealed sodium of 119 with chloride of 88, potassium 4.8, blood glucose of 1150, BUN of 25 and creatinine 4.22 and anion gap of 10 with a CO2 of 21.  BNP was 1162 and serum osmolality was 338.  CBC showed minimal leukopenia.  COVID-19 PCR is currently pending.  5/6: Patient seen and examined in the emergency department.  Lethargic but hemodynamically stable.  Sugars have improved.  Able to titrate off Endo tool protocol.  Started subcutaneous insulin regimen.  Patient can dispo to MedSurg floor.  Nephrology consulted.  Hemodialysis today.   Assessment & Plan:   Active Problems:   Hyperosmolar hyperglycemic state (HHS) ([place])   Uncontrolled type 2 diabetes mellitus with hyperosmolar nonketotic hyperglycemia (HCC)  Hyperosmolar nonketotic hyperglycemia Uncontrolled type 2 diabetes mellitus Pseudohyponatremia No acidosis noted on initial ABG or BMP Hyponatremia likely driven by hyperglycemia as initial blood glucose greater than 900 Titrated off Endo tool  protocol in emergency department Plan: Transfer to MedSurg status DC insulin protocol DC maintenance IV fluids Carb controlled diet Subcutaneous insulin regimen, basal bolus Diabetes coordinator consult CBG before meals and at bedtime  End-stage renal disease on hemodialysis TTS schedule  Hypertension, poor controlled Continue home antihypertensives As needed IV labetalol  Depression On Zoloft  GERD PPI    DVT prophylaxis: Heparin subcu Code Status: Full Family Communication: None today Disposition Plan: Status is: Inpatient  Remains inpatient appropriate because:Inpatient level of care appropriate due to severity of illness   Dispo: The patient is from: Home              Anticipated d/c is to: Home              Anticipated d/c date is: 2 days              Patient currently is not medically stable to d/c.  Patient with resolving HHS.  Unclear trigger.  Compliance to home insulin regimen is unclear.  Transition to subcutaneous insulin regimen today.    Consultants:   Nephrology- kidney  Procedures:   None  Antimicrobials:   None   Subjective: Seen and examined Lethargic but answers all questions appropriately  Objective: Vitals:   09/17/19 0643 09/17/19 0747 09/17/19 0756 09/17/19 1141  BP:    (!) 164/98  Pulse: 70 77 73 73  Resp: 15 20 19 16   Temp:      TempSrc:      SpO2: 95% 97% 96% 98%  Weight:        Intake/Output Summary (Last 24 hours)  at 09/17/2019 1404 Last data filed at 09/16/2019 2318 Gross per 24 hour  Intake --  Output 200 ml  Net -200 ml   Filed Weights   09/16/19 2300  Weight: 68.6 kg    Examination:  General exam: Lethargic, appears chronically ill Respiratory system: Clear to auscultation. Respiratory effort normal. Cardiovascular system: S1 & S2 heard, RRR. No JVD, murmurs, rubs, gallops or clicks. No pedal edema. Gastrointestinal system: Abdomen is nondistended, soft and nontender. No organomegaly or masses  felt. Normal bowel sounds heard. Central nervous system: Alert and oriented. No focal neurological deficits. Extremities: Symmetric 5 x 5 power. Skin: No rashes, lesions or ulcers Psychiatry: Lethargic    Data Reviewed: I have personally reviewed following labs and imaging studies  CBC: Recent Labs  Lab 09/16/19 1935  WBC 3.7*  HGB 13.8  HCT 43.9  MCV 93.2  PLT 671   Basic Metabolic Panel: Recent Labs  Lab 09/16/19 1935 09/16/19 2325 09/17/19 0348  NA 119* 124* 133*  K 4.8 3.5 2.4*  CL 88* 94* 107  CO2 21* 20* 19*  GLUCOSE 1,150* 900* 323*  BUN 25* 25* 22*  CREATININE 4.22* 4.25* 3.82*  CALCIUM 8.1* 7.5* 6.6*   GFR: Estimated Creatinine Clearance: 20.2 mL/min (A) (by C-G formula based on SCr of 3.82 mg/dL (H)). Liver Function Tests: No results for input(s): AST, ALT, ALKPHOS, BILITOT, PROT, ALBUMIN in the last 168 hours. No results for input(s): LIPASE, AMYLASE in the last 168 hours. No results for input(s): AMMONIA in the last 168 hours. Coagulation Profile: No results for input(s): INR, PROTIME in the last 168 hours. Cardiac Enzymes: No results for input(s): CKTOTAL, CKMB, CKMBINDEX, TROPONINI in the last 168 hours. BNP (last 3 results) No results for input(s): PROBNP in the last 8760 hours. HbA1C: No results for input(s): HGBA1C in the last 72 hours. CBG: Recent Labs  Lab 09/17/19 0243 09/17/19 0341 09/17/19 0450 09/17/19 0604 09/17/19 0735  GLUCAP 476* 367* 290* 185* 109*   Lipid Profile: No results for input(s): CHOL, HDL, LDLCALC, TRIG, CHOLHDL, LDLDIRECT in the last 72 hours. Thyroid Function Tests: No results for input(s): TSH, T4TOTAL, FREET4, T3FREE, THYROIDAB in the last 72 hours. Anemia Panel: No results for input(s): VITAMINB12, FOLATE, FERRITIN, TIBC, IRON, RETICCTPCT in the last 72 hours. Sepsis Labs: Recent Labs  Lab 09/16/19 2318  LATICACIDVEN 3.1*    Recent Results (from the past 240 hour(s))  Respiratory Panel by RT PCR (Flu  A&B, Covid) - Nasopharyngeal Swab     Status: None   Collection Time: 09/16/19 10:20 PM   Specimen: Nasopharyngeal Swab  Result Value Ref Range Status   SARS Coronavirus 2 by RT PCR NEGATIVE NEGATIVE Final    Comment: (NOTE) SARS-CoV-2 target nucleic acids are NOT DETECTED. The SARS-CoV-2 RNA is generally detectable in upper respiratoy specimens during the acute phase of infection. The lowest concentration of SARS-CoV-2 viral copies this assay can detect is 131 copies/mL. A negative result does not preclude SARS-Cov-2 infection and should not be used as the sole basis for treatment or other patient management decisions. A negative result may occur with  improper specimen collection/handling, submission of specimen other than nasopharyngeal swab, presence of viral mutation(s) within the areas targeted by this assay, and inadequate number of viral copies (<131 copies/mL). A negative result must be combined with clinical observations, patient history, and epidemiological information. The expected result is Negative. Fact Sheet for Patients:  PinkCheek.be Fact Sheet for Healthcare Providers:  GravelBags.it This test is not yet  ap proved or cleared by the [place] and  has been authorized for detection and/or diagnosis of SARS-CoV-2 by FDA under an Emergency Use Authorization (EUA). This EUA will remain  in effect (meaning this test can be used) for the duration of the COVID-19 declaration under Section 564(b)(1) of the Act, 21 U.S.C. section 360bbb-3(b)(1), unless the authorization is terminated or revoked sooner.    Influenza A by PCR NEGATIVE NEGATIVE Final   Influenza B by PCR NEGATIVE NEGATIVE Final    Comment: (NOTE) The Xpert Xpress SARS-CoV-2/FLU/RSV assay is intended as an aid in  the diagnosis of influenza from Nasopharyngeal swab specimens and  should not be used as a sole basis for treatment. Nasal washings  and  aspirates are unacceptable for Xpert Xpress SARS-CoV-2/FLU/RSV  testing. Fact Sheet for Patients: PinkCheek.be Fact Sheet for Healthcare Providers: GravelBags.it This test is not yet approved or cleared by the [place] FDA and  has been authorized for detection and/or diagnosis of SARS-CoV-2 by  FDA under an Emergency Use Authorization (EUA). This EUA will remain  in effect (meaning this test can be used) for the duration of the  Covid-19 declaration under Section 564(b)(1) of the Act, 21  U.S.C. section 360bbb-3(b)(1), unless the authorization is  terminated or revoked. Performed at [facility], [address], [place], [place] [address]          Radiology Studies: US Venous Img Upper Uni Right(DVT)  Result Date: 09/15/2019 CLINICAL DATA:  Right upper extremity swelling. EXAM: Right UPPER EXTREMITY VENOUS DOPPLER ULTRASOUND TECHNIQUE: Gray-scale sonography with graded compression, as well as color Doppler and duplex ultrasound were performed to evaluate the upper extremity deep venous system from the level of the subclavian vein and including the jugular, axillary, basilic, radial, ulnar and upper cephalic vein. Spectral Doppler was utilized to evaluate flow at rest and with distal augmentation maneuvers. COMPARISON:  None. FINDINGS: Contralateral Subclavian Vein: Respiratory phasicity is normal and symmetric with the symptomatic side. No evidence of thrombus. Normal compressibility. Internal Jugular Vein: No evidence of thrombus. Normal compressibility, respiratory phasicity and response to augmentation. Subclavian Vein: No evidence of thrombus. Normal compressibility, respiratory phasicity and response to augmentation. Axillary Vein: No evidence of thrombus. Normal compressibility, respiratory phasicity and response to augmentation. Cephalic Vein: Not visualized. Basilic Vein: No evidence of thrombus. Normal  compressibility, respiratory phasicity and response to augmentation. Brachial Veins: No evidence of thrombus. Normal compressibility, respiratory phasicity and response to augmentation. Radial Veins: No evidence of thrombus. Normal compressibility, respiratory phasicity and response to augmentation. Ulnar Veins: No evidence of thrombus. Normal compressibility, respiratory phasicity and response to augmentation. Venous Reflux:  None visualized. Other Findings:  None visualized. IMPRESSION: No definite evidence of DVT within the right upper extremity. Electronically Signed   By: Marijo Conception M.D.   On: 09/15/2019 16:29   DG Chest Portable 1 View  Result Date: 09/16/2019 CLINICAL DATA:  End-stage renal disease, weakness, hyperglycemia EXAM: PORTABLE CHEST 1 VIEW COMPARISON:  08/24/2019 FINDINGS: Single frontal view of the chest demonstrates stable right-sided dialysis catheter. Cardiac silhouette is unchanged. Chronic central vascular congestion without airspace disease, effusion, or pneumothorax. Right posterior eighth rib fracture unchanged. IMPRESSION: 1. Stable chronic central vascular congestion.  No acute process. Electronically Signed   By: Randa Ngo M.D.   On: 09/16/2019 20:28        Scheduled Meds: . amLODipine  10 mg Oral Daily  . aspirin EC  81 mg Oral Daily  . calcium carbonate  1,250 mg Oral BID WC  . Chlorhexidine Gluconate Cloth  6 each Topical Q0600  . feeding supplement (NEPRO CARB STEADY)  237 mL Oral TID BM  . folic acid  1 mg Oral Daily  . heparin injection (subcutaneous)  5,000 Units Subcutaneous Q8H  . hydrALAZINE  25 mg Oral Q8H  . insulin aspart  0-15 Units Subcutaneous TID WC  . insulin aspart  0-5 Units Subcutaneous QHS  . insulin aspart  3 Units Subcutaneous TID WC  . insulin glargine  10 Units Subcutaneous Q24H  . metoprolol tartrate  50 mg Oral BID  . multivitamin  1 tablet Oral QHS  . pantoprazole  40 mg Oral Daily  . sacubitril-valsartan  1 tablet Oral  BID  . sertraline  100 mg Oral Daily  . thiamine  100 mg Oral Daily   Continuous Infusions: . insulin Stopped (09/17/19 0737)     LOS: 1 day    Time spent: 35 minutes    Sidney Ace, MD [facility] Pager [phone]  If 7PM-7AM, please contact night-coverage 09/17/2019, 2:04 PM

## 2019-09-17 NOTE — ED Notes (Signed)
Pt given warm blanket; CBG rechecked: 290. Lights dimmed and pt eating food.

## 2019-09-17 NOTE — ED Notes (Signed)
Pt to room 33 from dialysis requesting to order dinner

## 2019-09-17 NOTE — Progress Notes (Signed)
PT TO UNIT FROM ED FOR HD TX VITALS STABLE , PT STABLE [facility] WDL UFG 2L

## 2019-09-21 ENCOUNTER — Ambulatory Visit: Payer: Medicare Other | Admitting: Podiatry

## 2019-10-22 ENCOUNTER — Ambulatory Visit: Payer: Medicare Other | Admitting: Podiatry

## 2019-10-26 ENCOUNTER — Ambulatory Visit: Payer: Medicare Other | Admitting: Podiatry

## 2019-11-09 ENCOUNTER — Ambulatory Visit (INDEPENDENT_AMBULATORY_CARE_PROVIDER_SITE_OTHER): Payer: Medicare Other

## 2019-11-09 ENCOUNTER — Ambulatory Visit (INDEPENDENT_AMBULATORY_CARE_PROVIDER_SITE_OTHER): Payer: Medicare Other | Admitting: Podiatry

## 2019-11-09 ENCOUNTER — Other Ambulatory Visit: Payer: Self-pay

## 2019-11-09 ENCOUNTER — Encounter: Payer: Self-pay | Admitting: Podiatry

## 2019-11-09 DIAGNOSIS — B351 Tinea unguium: Secondary | ICD-10-CM

## 2019-11-09 DIAGNOSIS — M79675 Pain in left toe(s): Secondary | ICD-10-CM

## 2019-11-09 DIAGNOSIS — M86672 Other chronic osteomyelitis, left ankle and foot: Secondary | ICD-10-CM

## 2019-11-09 DIAGNOSIS — M79674 Pain in right toe(s): Secondary | ICD-10-CM | POA: Diagnosis not present

## 2019-11-09 DIAGNOSIS — L03032 Cellulitis of left toe: Secondary | ICD-10-CM | POA: Diagnosis not present

## 2019-11-09 DIAGNOSIS — E1142 Type 2 diabetes mellitus with diabetic polyneuropathy: Secondary | ICD-10-CM | POA: Diagnosis not present

## 2019-11-09 MED ORDER — DOXYCYCLINE HYCLATE 100 MG PO TABS
100.0000 mg | ORAL_TABLET | Freq: Two times a day (BID) | ORAL | 0 refills | Status: DC
Start: 2019-11-09 — End: 2019-11-19

## 2019-11-09 NOTE — Progress Notes (Signed)
This patient returns to my office for at risk foot care.  This patient requires this care by a professional since this patient will be at risk due to having uncontrolled diabetes and ESRD. He presents to the office with his brother.  He lives at home with his wife.  Upon exam he has a swollen painful second toenail left foot.   It appears to have been injured the nail..   This patient is unable to cut nails himself since the patient cannot reach his nails.These nails are painful walking and wearing shoes.  His second toe left foot is extremely painful and swollen. This patient presents for at risk foot care today.  General Appearance  Alert, conversant and in no acute stress.  Vascular  Dorsalis pedis and posterior tibial  pulses are weakly  palpable  bilaterally.  Capillary return is within normal limits  bilaterally. Cold feet noted.   bilaterally.  Neurologic  Senn-Weinstein monofilament wire test diminished   bilaterally. Muscle power within normal limits bilaterally.  Nails Thick disfigured discolored nails with subungual debris  from hallux to fifth toes bilaterally. No evidence of bacterial infection or drainage bilaterally. Subungual pus under second nail plate left foot.    Orthopedic  No limitations of motion  feet .  No crepitus or effusions noted.  No bony pathology or digital deformities noted. HAV 1st MPJ left foot.  Skin  normotropic skin with no porokeratosis noted bilaterally.  No signs of infections or ulcers noted.   Swelling and blackened skin noted proximally second toenail left foot.  Onychomycosis  Pain in right toes  Pain in left toes  Abscess/Cellulitis second toe left foot.  Osteomyelitis second toe left foot.  Consent was obtained for treatment procedures.   Mechanical debridement of nails 1-5  bilaterally performed with a nail nipper.  Filed with dremel without incident.  Incision and drainage second toe left foot.   Neosporin/DSD.  X-ray was taken revealing the absence of  the distal phalanx second toe left.  Absent 3,4 metatarsal head left foot.Bony changes at the base of proximal phalanx 3,4 left  Foot.  Calcification of the artery between 1,2 metatatsal left foot.  Upon returning from the x-ray,  Additional  pus was noted on the medial aspect of second toe at the level of DIPJ.  A culture was taken of this drainage to be sent to the lab.  Neosporin/DSD.   Home instructions given for home soaks.  Prescribe doxycycline  100 mg.  One BID /15 days..  Vascular studies to be ordered upon office return.     Return office visit    10 days                  Told patient to return for examination of chronic osteomyelitis second toe left foot.   Gardiner Barefoot DPM

## 2019-11-10 NOTE — Addendum Note (Signed)
Addended byDeidre Ala, Mayuri Staples L on: 11/10/2019 03:10 PM   Modules accepted: Orders

## 2019-11-17 ENCOUNTER — Telehealth (INDEPENDENT_AMBULATORY_CARE_PROVIDER_SITE_OTHER): Payer: Self-pay

## 2019-11-17 NOTE — Telephone Encounter (Signed)
Spoke with the patient and he is scheduled with Dr. Delana Meyer for a right brachial axillary graft placement on 11/25/19. Patient will do a phone call pre-op on 11/23/19 between 8-1 pm and covid testing on 11/24/19 between 8-1 pm at the [facility]. Pre-surgical instructions were discussed and will be mailed.

## 2019-11-19 ENCOUNTER — Ambulatory Visit: Payer: Medicare Other | Admitting: Podiatry

## 2019-11-19 ENCOUNTER — Other Ambulatory Visit: Payer: Self-pay

## 2019-11-19 ENCOUNTER — Encounter: Payer: Self-pay | Admitting: Podiatry

## 2019-11-19 ENCOUNTER — Ambulatory Visit (INDEPENDENT_AMBULATORY_CARE_PROVIDER_SITE_OTHER): Payer: Medicare Other | Admitting: Podiatry

## 2019-11-19 DIAGNOSIS — M86672 Other chronic osteomyelitis, left ankle and foot: Secondary | ICD-10-CM

## 2019-11-19 DIAGNOSIS — M79674 Pain in right toe(s): Secondary | ICD-10-CM | POA: Diagnosis not present

## 2019-11-19 DIAGNOSIS — B351 Tinea unguium: Secondary | ICD-10-CM | POA: Diagnosis not present

## 2019-11-19 DIAGNOSIS — E1142 Type 2 diabetes mellitus with diabetic polyneuropathy: Secondary | ICD-10-CM

## 2019-11-19 DIAGNOSIS — M79675 Pain in left toe(s): Secondary | ICD-10-CM

## 2019-11-19 MED ORDER — DOXYCYCLINE HYCLATE 100 MG PO TABS: 100.0000 mg | ORAL_TABLET | Freq: Two times a day (BID) | ORAL | 0 refills | Status: DC

## 2019-11-19 NOTE — Progress Notes (Signed)
This patient returns to my office for at risk foot care.  This patient requires this care by a professional since this patient will be at risk due to having uncontrolled diabetes and ESRD. He presents to the office with his wife..  .  This  lives at home with his wife.  Upon exam he states he has been taking his antibiotics and his second  toe has no drainage and is not painful.  He presents to the office wearing a bandage on his second toe left foot..   This patient is unable to cut nails himself since the patient cannot reach his nails.These nails are painful walking and wearing shoes.  This patient presents for at risk foot care today.  General Appearance  Alert, conversant and in no acute stress.  Vascular  Dorsalis pedis and posterior tibial  pulses are weakly  palpable  bilaterally.  Capillary return is within normal limits  bilaterally. Cold feet noted.   bilaterally.  Neurologic  Senn-Weinstein monofilament wire test diminished   bilaterally. Muscle power within normal limits bilaterally.  Nails Thick disfigured discolored nails with subungual debris  from hallux to fifth toes bilaterally. No evidence of bacterial infection or drainage bilaterally.  Orthopedic  No limitations of motion  feet .  No crepitus or effusions noted.  No bony pathology or digital deformities noted. HAV 1st MPJ left foot.  Skin  normotropic skin with no porokeratosis noted bilaterally.  No signs of infections or ulcers noted.   His second toe has blackened dorsally but the infection appears to be improving.    Onychomycosis  Pain in right toes  Pain in left toes    Osteomyelitis second toe left foot.  Consent was obtained for treatment procedures.   Mechanical debridement of nails 1-5  bilaterally performed with a nail nipper.  Filed with dremel without incident.       Neosporin/DSD second toe left foot..   Prescribe doxycycline  100 mg.  One BID /15 days..  Vascular studies to be ordered upon office return.  Patient  was seen with Dr.  Posey Pronto who recommends we continue to follow toe and let it declare itself.   Patient also relates stepping on a nail on the bottom of his right foot.  No evidence of pus or  Drainage from the open wound on the bottom of right foot. Patient to return to the office in 10 days to be followed by Dr.  Posey Pronto.     Return office visit    10 days                     Gardiner Barefoot DPM

## 2019-11-23 ENCOUNTER — Other Ambulatory Visit
Admission: RE | Admit: 2019-11-23 | Discharge: 2019-11-23 | Disposition: A | Discharge status: Home / Self Care | Payer: Medicare Other | Source: Ambulatory Visit | Attending: Vascular Surgery | Admitting: Vascular Surgery

## 2019-11-23 ENCOUNTER — Ambulatory Visit: Payer: Medicare Other | Admitting: Podiatry

## 2019-11-23 ENCOUNTER — Other Ambulatory Visit (INDEPENDENT_AMBULATORY_CARE_PROVIDER_SITE_OTHER): Payer: Self-pay | Admitting: Nurse Practitioner

## 2019-11-23 NOTE — Pre-Procedure Instructions (Signed)
EKG  ED ECG REPORT I, Arta Silence, the attending physician, personally viewed and interpreted this ECG.  Date: 09/16/2019 EKG Time: 1940 Rate: 79 Rhythm: sinus rhythm with possible ectopic atrial beats QRS Axis: Left axis Intervals: normal ST/T Wave abnormalities: Nonspecific ST abnormalities Narrative Interpretation: no evidence of acute ischemia  ____________________________________________  RADIOLOGY  CXR: No focal infiltrate or other acute abnormality  ____________________________________________   PROCEDURES  Procedure(s) performed: No  Procedures  Critical Care performed: Yes  CRITICAL CARE Performed by: Arta Silence   Total critical care time: 15 minutes  Critical care time was exclusive of separately billable procedures and treating other patients.  Critical care was necessary to treat or prevent imminent or life-threatening deterioration.  Critical care was time spent personally by me on the following activities: development of treatment plan with patient and/or surrogate as well as nursing, discussions with consultants, evaluation of patient's response to treatment, examination of patient, obtaining history from patient or surrogate, ordering and performing treatments and interventions, ordering and review of laboratory studies, ordering and review of radiographic studies, pulse oximetry and re-evaluation of patient's condition. ____________________________________________   INITIAL IMPRESSION / ASSESSMENT AND PLAN / ED COURSE  Pertinent labs & imaging results that were available during my care of the patient were reviewed by me and considered in my medical decision making (see chart for details).  59 year old male with a history of ESRD on dialysis, diabetes, and other PMH as noted above presents with hyperglycemia over 600, noted on labs at his doctor's office today.  I reviewed the past medical records in [place].  The  patient was admitted last month with hypoglycemia and altered mental status.  He has a history of recurrent hypoglycemia and hyperglycemia likely due to medication noncompliance as well as a CVA with right-sided weakness and a history of alcohol abuse.  On exam, the patient is chronically ill and weak appearing but in no acute distress.  His vital signs are normal except for hypertension.  He has no increased work of breathing or respiratory distress and his lungs are clear.  The abdomen is soft and nontender.  Mucous membranes are somewhat dry.  He has nonpitting edema to the right arm which is subacute.  Overall presentation is consistent with HHS versus DKA.  The patient has no other focal symptoms, there is no evidence of precipitating infection.  We will start fluids and insulin via Endo tool, obtain labs for further work-up, and plan for admission.  ----------------------------------------- 9:40 PM on 09/16/2019 -----------------------------------------  Lab work-up reveals glucose of over 1000 with no anion gap.  This is consistent with hyperosmolar hyperglycemic state.  The patient is receiving fluids and an insulin infusion.  I discussed the case with hospitalist Dr. Sidney Ace for admission.   ____________________________________________   FINAL CLINICAL IMPRESSION(S) / ED DIAGNOSES  Final diagnoses:  Hyperosmolar hyperglycemic state (HHS) ([place])      NEW MEDICATIONS STARTED DURING THIS VISIT:     New Prescriptions   No medications on file     Note:  This document was prepared using Dragon voice recognition software and may include unintentional dictation errors.    Arta Silence, MD 09/16/19 2140         Electronically signed by Arta Silence, MD at 09/16/2019 9:40 PM  ED on 09/16/2019   ED on 09/16/2019     Detailed Report    Note shared with patient

## 2019-11-24 ENCOUNTER — Inpatient Hospital Stay
Admission: RE | Admit: 2019-11-24 | Discharge: 2019-11-24 | Disposition: A | Discharge status: Home / Self Care | Payer: Medicare Other | Source: Ambulatory Visit

## 2019-11-24 ENCOUNTER — Other Ambulatory Visit
Admission: RE | Admit: 2019-11-24 | Discharge: 2019-11-24 | Disposition: A | Discharge status: Home / Self Care | Payer: Medicare Other | Source: Ambulatory Visit | Attending: Vascular Surgery | Admitting: Vascular Surgery

## 2019-11-24 MED ORDER — CEFAZOLIN SODIUM-DEXTROSE 1-4 GM/50ML-% IV SOLN
1.0000 g | INTRAVENOUS | Status: AC
  Administered 2019-11-25: 1 g via INTRAVENOUS

## 2019-11-24 NOTE — Pre-Procedure Instructions (Signed)
Secure chat to Dr.Schnier to notify him that we have been unable to contact the patient for his PAT phone call.  He did not show up for his labs and covid test today.  We will need a H&P also if we proceed with the surgery tomorrow.

## 2019-11-24 NOTE — Pre-Procedure Instructions (Signed)
PAT phone call attempt this am at 10:00.  No return call.  Instructions and soap left in Covid test area incase patient arrives for covid test.

## 2019-11-24 NOTE — Patient Instructions (Addendum)
Your procedure is scheduled on: Wed. 7/14 Report to Day Surgery. To find out your arrival time please call [phone] between 1PM - 3PM on Tues 7/13.  Remember: Instructions that are not followed completely may result in serious medical risk,  up to and including death, or upon the discretion of your surgeon and anesthesiologist your  surgery may need to be rescheduled.     _X__ 1. Do not eat food after midnight the night before your procedure.                 No gum chewing or hard candies. You may drink clear liquids up to 2 hours                 before you are scheduled to arrive for your surgery- DO not drink clear                 liquids within 2 hours of the start of your surgery.                 Clear Liquids include:  water,  G2 or                  Gatorade Zero (avoid Red/Purple/Blue), Black Coffee or Tea (Do not add                 anything to coffee or tea). _____2.   Complete the carbohydrate drink provided to you, 2 hours before arrival.  __X__2.  On the morning of surgery brush your teeth with toothpaste and water, you                may rinse your mouth with mouthwash if you wish.  Do not swallow any toothpaste of mouthwash.     _X__ 3.  No Alcohol for 24 hours before or after surgery.   _X__ 4.  Do Not Smoke or use e-cigarettes For 24 Hours Prior to Your Surgery.                 Do not use any chewable tobacco products for at least 6 hours prior to                 Surgery.  _X__  5.  Do not use any recreational drugs (marijuana, cocaine, heroin, ecstacy, MDMA or other)                For at least one week prior to your surgery.  Combination of these drugs with anesthesia                May have life threatening results.  ____  6.  Bring all medications with you on the day of surgery if instructed.   __x__  7.  Notify your doctor if there is any change in your medical condition      (cold, fever, infections).     Do not wear jewelry,  Do  not wear lotions,  Do not shave 48 hours prior to surgery. Men may shave face and neck. Do not bring valuables to the hospital.    [facility] is not responsible for any belongings or valuables.  Contacts, dentures or bridgework may not be worn into surgery. Leave your suitcase in the car. After surgery it may be brought to your room. For patients admitted to the hospital, discharge time is determined by your treatment team.   Patients discharged the day of surgery will not be allowed to drive home.  Make arrangements for someone to be with you for the first 24 hours of your Same Day Discharge.    Please read over the following fact sheets that you were given:    _x___ Take these medicines the morning of surgery with A SIP OF WATER:    1. amLODipine (NORVASC) 10 MG tablet  2. metoprolol tartrate (LOPRESSOR) 50 MG tablet  3. pantoprazole (PROTONIX) 40 MG tablet  Night before and morning of surgery  4.hydrALAZINE (APRESOLINE) 25 MG tablet  5.  6.  ____ Fleet Enema (as directed)   _x___ Use CHG Soap (or wipes) as directed  ____ Use Benzoyl Peroxide Gel as instructed  ____ Use inhalers on the day of surgery  ____ Stop metformin 2 days prior to surgery    __x__ Take 1/2 of usual insulin dose the night before surgery. No insulin the morning          of surgery.   ____ Stop Coumadin/Plavix/aspirin on   ____ Stop Anti-inflammatories on    _x___ Stop supplements until after surgery.  Melatonin 3 MG TABS  ____ Bring C-Pap to the hospital.

## 2019-11-24 NOTE — Pre-Procedure Instructions (Signed)
Attempted to contact patient via phone x 3 to remind him of his covid test appointment for today.  I was unable to make contact with patient but did let Dr. Hal Morales' office aware of situation.

## 2019-11-25 ENCOUNTER — Ambulatory Visit: Payer: Medicare Other | Admitting: Anesthesiology

## 2019-11-25 ENCOUNTER — Encounter: Payer: Self-pay | Admitting: Vascular Surgery

## 2019-11-25 ENCOUNTER — Encounter
Admission: RE | Disposition: A | Discharge status: Home / Self Care | Payer: Self-pay | Source: Home / Self Care | Attending: Vascular Surgery

## 2019-11-25 ENCOUNTER — Other Ambulatory Visit: Payer: Self-pay

## 2019-11-25 ENCOUNTER — Ambulatory Visit
Admission: RE | Admit: 2019-11-25 | Discharge: 2019-11-25 | Disposition: A | Discharge status: Home / Self Care | Payer: Medicare Other | Attending: Vascular Surgery | Admitting: Vascular Surgery

## 2019-11-25 DIAGNOSIS — Z8673 Personal history of transient ischemic attack (TIA), and cerebral infarction without residual deficits: Secondary | ICD-10-CM | POA: Diagnosis not present

## 2019-11-25 DIAGNOSIS — I2510 Atherosclerotic heart disease of native coronary artery without angina pectoris: Secondary | ICD-10-CM

## 2019-11-25 DIAGNOSIS — E1122 Type 2 diabetes mellitus with diabetic chronic kidney disease: Secondary | ICD-10-CM | POA: Diagnosis not present

## 2019-11-25 DIAGNOSIS — I120 Hypertensive chronic kidney disease with stage 5 chronic kidney disease or end stage renal disease: Secondary | ICD-10-CM | POA: Insufficient documentation

## 2019-11-25 DIAGNOSIS — Z794 Long term (current) use of insulin: Secondary | ICD-10-CM | POA: Diagnosis not present

## 2019-11-25 DIAGNOSIS — T82898A Other specified complication of vascular prosthetic devices, implants and grafts, initial encounter: Secondary | ICD-10-CM

## 2019-11-25 DIAGNOSIS — F17210 Nicotine dependence, cigarettes, uncomplicated: Secondary | ICD-10-CM | POA: Diagnosis not present

## 2019-11-25 DIAGNOSIS — K219 Gastro-esophageal reflux disease without esophagitis: Secondary | ICD-10-CM | POA: Insufficient documentation

## 2019-11-25 DIAGNOSIS — Z20822 Contact with and (suspected) exposure to covid-19: Secondary | ICD-10-CM | POA: Insufficient documentation

## 2019-11-25 DIAGNOSIS — Z992 Dependence on renal dialysis: Secondary | ICD-10-CM

## 2019-11-25 DIAGNOSIS — N186 End stage renal disease: Secondary | ICD-10-CM

## 2019-11-25 DIAGNOSIS — E785 Hyperlipidemia, unspecified: Secondary | ICD-10-CM | POA: Diagnosis not present

## 2019-11-25 HISTORY — PX: AV FISTULA PLACEMENT: SHX1204

## 2019-11-25 LAB — CBC WITH DIFFERENTIAL/PLATELET
Abs Immature Granulocytes: 0.01 10*3/uL (ref 0.00–0.07)
Basophils Absolute: 0 10*3/uL (ref 0.0–0.1)
Basophils Relative: 0 %
Eosinophils Absolute: 0.1 10*3/uL (ref 0.0–0.5)
Eosinophils Relative: 2 %
HCT: 28 % — ABNORMAL LOW (ref 39.0–52.0)
Hemoglobin: 9.1 g/dL — ABNORMAL LOW (ref 13.0–17.0)
Immature Granulocytes: 0 %
Lymphocytes Relative: 25 %
Lymphs Abs: 1.2 10*3/uL (ref 0.7–4.0)
MCH: 30.2 pg (ref 26.0–34.0)
MCHC: 32.5 g/dL (ref 30.0–36.0)
MCV: 93 fL (ref 80.0–100.0)
Monocytes Absolute: 0.3 10*3/uL (ref 0.1–1.0)
Monocytes Relative: 5 %
Neutro Abs: 3.1 10*3/uL (ref 1.7–7.7)
Neutrophils Relative %: 68 %
Platelets: 157 10*3/uL (ref 150–400)
RBC: 3.01 MIL/uL — ABNORMAL LOW (ref 4.22–5.81)
RDW: 15.3 % (ref 11.5–15.5)
WBC: 4.6 10*3/uL (ref 4.0–10.5)
nRBC: 0 % (ref 0.0–0.2)

## 2019-11-25 LAB — BASIC METABOLIC PANEL
Anion gap: 12 (ref 5–15)
BUN: 37 mg/dL — ABNORMAL HIGH (ref 6–20)
CO2: 26 mmol/L (ref 22–32)
Calcium: 8.2 mg/dL — ABNORMAL LOW (ref 8.9–10.3)
Chloride: 99 mmol/L (ref 98–111)
Creatinine, Ser: 4.01 mg/dL — ABNORMAL HIGH (ref 0.61–1.24)
GFR calc Af Amer: 18 mL/min — ABNORMAL LOW (ref >60)
GFR calc non Af Amer: 15 mL/min — ABNORMAL LOW (ref >60)
Glucose, Bld: 351 mg/dL — ABNORMAL HIGH (ref 70–99)
Potassium: 3 mmol/L — ABNORMAL LOW (ref 3.5–5.1)
Sodium: 137 mmol/L (ref 135–145)

## 2019-11-25 LAB — TYPE AND SCREEN
ABO/RH(D): A POS
Antibody Screen: NEGATIVE

## 2019-11-25 LAB — PROTIME-INR
INR: 1.1 (ref 0.8–1.2)
Prothrombin Time: 13.4 seconds (ref 11.4–15.2)

## 2019-11-25 LAB — GLUCOSE, CAPILLARY
Glucose-Capillary: 341 mg/dL — ABNORMAL HIGH (ref 70–99)
Glucose-Capillary: 287 mg/dL — ABNORMAL HIGH (ref 70–99)
Glucose-Capillary: 129 mg/dL — ABNORMAL HIGH (ref 70–99)

## 2019-11-25 LAB — SARS CORONAVIRUS 2 BY RT PCR (HOSPITAL ORDER, PERFORMED IN ~~LOC~~ HOSPITAL LAB): SARS Coronavirus 2: NEGATIVE

## 2019-11-25 LAB — APTT: aPTT: 24 seconds (ref 24–36)

## 2019-11-25 SURGERY — INSERTION OF ARTERIOVENOUS (AV) GORE-TEX GRAFT ARM
Anesthesia: General | Site: Arm Upper | Laterality: Right

## 2019-11-25 MED ORDER — MIDAZOLAM HCL 2 MG/2ML IJ SOLN
INTRAMUSCULAR | Status: AC
  Filled 2019-11-25: qty 2

## 2019-11-25 MED ORDER — PHENYLEPHRINE HCL (PRESSORS) 10 MG/ML IV SOLN
INTRAVENOUS | Status: DC | PRN
  Administered 2019-11-25: 150 ug via INTRAVENOUS
  Administered 2019-11-25 (×3): 100 ug via INTRAVENOUS

## 2019-11-25 MED ORDER — HYDROCODONE-ACETAMINOPHEN 5-325 MG PO TABS: 1.0000 | ORAL_TABLET | Freq: Four times a day (QID) | ORAL | 0 refills | Status: DC | PRN

## 2019-11-25 MED ORDER — ONDANSETRON HCL 4 MG/2ML IJ SOLN: 4.0000 mg | Freq: Once | INTRAMUSCULAR | Status: DC | PRN

## 2019-11-25 MED ORDER — HEMOSTATIC AGENTS (NO CHARGE) OPTIME
TOPICAL | Status: DC | PRN
  Administered 2019-11-25: 1 via TOPICAL

## 2019-11-25 MED ORDER — ORAL CARE MOUTH RINSE: 15.0000 mL | Freq: Once | OROMUCOSAL | Status: AC

## 2019-11-25 MED ORDER — SODIUM CHLORIDE 0.9 % IV SOLN
INTRAVENOUS | Status: DC
  Administered 2019-11-25: 10 mL/h via INTRAVENOUS

## 2019-11-25 MED ORDER — INSULIN REGULAR HUMAN 100 UNIT/ML IJ SOLN
10.0000 [IU] | Freq: Once | INTRAMUSCULAR | Status: AC
  Administered 2019-11-25: 10 [IU] via SUBCUTANEOUS
  Filled 2019-11-25: qty 10

## 2019-11-25 MED ORDER — CEFAZOLIN SODIUM-DEXTROSE 1-4 GM/50ML-% IV SOLN
INTRAVENOUS | Status: AC
  Filled 2019-11-25: qty 50

## 2019-11-25 MED ORDER — LIDOCAINE HCL (CARDIAC) PF 100 MG/5ML IV SOSY
PREFILLED_SYRINGE | INTRAVENOUS | Status: DC | PRN
  Administered 2019-11-25: 80 mg via INTRAVENOUS

## 2019-11-25 MED ORDER — FENTANYL CITRATE (PF) 100 MCG/2ML IJ SOLN
INTRAMUSCULAR | Status: DC | PRN
  Administered 2019-11-25: 25 ug via INTRAVENOUS
  Administered 2019-11-25: 50 ug via INTRAVENOUS
  Administered 2019-11-25: 25 ug via INTRAVENOUS

## 2019-11-25 MED ORDER — FENTANYL CITRATE (PF) 100 MCG/2ML IJ SOLN
INTRAMUSCULAR | Status: AC
  Filled 2019-11-25: qty 2

## 2019-11-25 MED ORDER — FENTANYL CITRATE (PF) 100 MCG/2ML IJ SOLN: 25.0000 ug | INTRAMUSCULAR | Status: DC | PRN

## 2019-11-25 MED ORDER — CHLORHEXIDINE GLUCONATE CLOTH 2 % EX PADS: 6.0000 | MEDICATED_PAD | Freq: Once | CUTANEOUS | Status: DC

## 2019-11-25 MED ORDER — PROPOFOL 10 MG/ML IV BOLUS
INTRAVENOUS | Status: DC | PRN
  Administered 2019-11-25: 100 mg via INTRAVENOUS

## 2019-11-25 MED ORDER — HYDROMORPHONE HCL 1 MG/ML IJ SOLN: 1.0000 mg | Freq: Once | INTRAMUSCULAR | Status: DC | PRN

## 2019-11-25 MED ORDER — INSULIN ASPART 100 UNIT/ML ~~LOC~~ SOLN
SUBCUTANEOUS | Status: AC
  Filled 2019-11-25: qty 1

## 2019-11-25 MED ORDER — HEPARIN SODIUM (PORCINE) 5000 UNIT/ML IJ SOLN
INTRAMUSCULAR | Status: AC
  Filled 2019-11-25: qty 1

## 2019-11-25 MED ORDER — ONDANSETRON HCL 4 MG/2ML IJ SOLN
4.0000 mg | Freq: Four times a day (QID) | INTRAMUSCULAR | Status: DC | PRN
  Administered 2019-11-25: 4 mg via INTRAVENOUS

## 2019-11-25 MED ORDER — CHLORHEXIDINE GLUCONATE 0.12 % MT SOLN: 15.0000 mL | Freq: Once | OROMUCOSAL | Status: AC

## 2019-11-25 MED ORDER — MIDAZOLAM HCL 2 MG/2ML IJ SOLN
INTRAMUSCULAR | Status: DC | PRN
  Administered 2019-11-25 (×2): 1 mg via INTRAVENOUS

## 2019-11-25 MED ORDER — CHLORHEXIDINE GLUCONATE 0.12 % MT SOLN
OROMUCOSAL | Status: AC
  Administered 2019-11-25: 15 mL via OROMUCOSAL
  Filled 2019-11-25: qty 15

## 2019-11-25 MED ORDER — SODIUM CHLORIDE 0.9 % IV SOLN
INTRAVENOUS | Status: DC | PRN
  Administered 2019-11-25: 50 mL via INTRAMUSCULAR

## 2019-11-25 MED ORDER — BUPIVACAINE HCL (PF) 0.5 % IJ SOLN
INTRAMUSCULAR | Status: AC
  Filled 2019-11-25: qty 30

## 2019-11-25 MED ORDER — DEXAMETHASONE SODIUM PHOSPHATE 10 MG/ML IJ SOLN
INTRAMUSCULAR | Status: DC | PRN
  Administered 2019-11-25: 10 mg via INTRAVENOUS

## 2019-11-25 MED ORDER — BUPIVACAINE LIPOSOME 1.3 % IJ SUSP
INTRAMUSCULAR | Status: AC
  Filled 2019-11-25: qty 20

## 2019-11-25 SURGICAL SUPPLY — 63 items
ADH SKN CLS APL DERMABOND .7 (GAUZE/BANDAGES/DRESSINGS) ×2
APPLIER CLIP 11 MED OPEN (CLIP)
APPLIER CLIP 9.375 SM OPEN (CLIP)
APR CLP MED 11 20 MLT OPN (CLIP)
APR CLP SM 9.3 20 MLT OPN (CLIP)
BAG COUNTER SPONGE EZ (MISCELLANEOUS) IMPLANT
BAG DECANTER FOR FLEXI CONT (MISCELLANEOUS) ×3 IMPLANT
BLADE SURG SZ11 CARB STEEL (BLADE) ×3 IMPLANT
BOOT SUTURE AID YELLOW STND (SUTURE) ×3 IMPLANT
BRUSH SCRUB EZ  4% CHG (MISCELLANEOUS) ×3
BRUSH SCRUB EZ 4% CHG (MISCELLANEOUS) ×1 IMPLANT
CANISTER SUCT 1200ML W/VALVE (MISCELLANEOUS) ×3 IMPLANT
CHLORAPREP W/TINT 26 (MISCELLANEOUS) ×3 IMPLANT
CLIP APPLIE 11 MED OPEN (CLIP) IMPLANT
CLIP APPLIE 9.375 SM OPEN (CLIP) IMPLANT
COUNTER SPONGE BAG EZ (MISCELLANEOUS)
COVER WAND RF STERILE (DRAPES) ×3 IMPLANT
DERMABOND ADVANCED (GAUZE/BANDAGES/DRESSINGS) ×4
DERMABOND ADVANCED .7 DNX12 (GAUZE/BANDAGES/DRESSINGS) ×2 IMPLANT
DRESSING SURGICEL FIBRLLR 1X2 (HEMOSTASIS) ×1 IMPLANT
DRSG SURGICEL FIBRILLAR 1X2 (HEMOSTASIS) ×3
ELECT CAUTERY BLADE 6.4 (BLADE) ×3 IMPLANT
ELECT REM PT RETURN 9FT ADLT (ELECTROSURGICAL) ×3
ELECTRODE REM PT RTRN 9FT ADLT (ELECTROSURGICAL) ×1 IMPLANT
GLOVE BIO SURGEON STRL SZ7 (GLOVE) ×12 IMPLANT
GLOVE INDICATOR 7.5 STRL GRN (GLOVE) ×12 IMPLANT
GLOVE SURG SYN 8.0 (GLOVE) ×3 IMPLANT
GOWN STRL REUS W/ TWL LRG LVL3 (GOWN DISPOSABLE) ×3 IMPLANT
GOWN STRL REUS W/ TWL XL LVL3 (GOWN DISPOSABLE) ×1 IMPLANT
GOWN STRL REUS W/TWL LRG LVL3 (GOWN DISPOSABLE) ×9
GOWN STRL REUS W/TWL XL LVL3 (GOWN DISPOSABLE) ×3
GRAFT PROPATEN STD WALL 4 7X45 (Vascular Products) ×3 IMPLANT
IV NS 500ML (IV SOLUTION) ×3
IV NS 500ML BAXH (IV SOLUTION) ×1 IMPLANT
KIT TURNOVER KIT A (KITS) ×3 IMPLANT
LABEL OR SOLS (LABEL) ×3 IMPLANT
LOOP RED MAXI  1X406MM (MISCELLANEOUS) ×4
LOOP VESSEL MAXI  1X406 RED (MISCELLANEOUS) ×2
LOOP VESSEL MAXI 1X406 RED (MISCELLANEOUS) ×2 IMPLANT
LOOP VESSEL MINI 0.8X406 BLUE (MISCELLANEOUS) ×2 IMPLANT
LOOPS BLUE MINI 0.8X406MM (MISCELLANEOUS) ×4
NEEDLE FILTER BLUNT 18X 1/2SAF (NEEDLE) ×2
NEEDLE FILTER BLUNT 18X1 1/2 (NEEDLE) ×1 IMPLANT
NS IRRIG 500ML POUR BTL (IV SOLUTION) ×3 IMPLANT
PACK EXTREMITY (MISCELLANEOUS) ×3 IMPLANT
PAD PREP 24X41 OB/GYN DISP (PERSONAL CARE ITEMS) ×3 IMPLANT
STOCKINETTE STRL 4IN 9604848 (GAUZE/BANDAGES/DRESSINGS) ×3 IMPLANT
SUT GTX CV-6 30 (SUTURE) ×12 IMPLANT
SUT MNCRL+ 5-0 UNDYED PC-3 (SUTURE) ×1 IMPLANT
SUT MONOCRYL 5-0 (SUTURE) ×3
SUT PROLENE 6 0 BV (SUTURE) ×6 IMPLANT
SUT PROLENE BV 1 BLUE 7-0 30IN (SUTURE) ×3 IMPLANT
SUT SILK 2 0 (SUTURE) ×3
SUT SILK 2 0 SH (SUTURE) ×3 IMPLANT
SUT SILK 2-0 18XBRD TIE 12 (SUTURE) ×1 IMPLANT
SUT SILK 3 0 (SUTURE) ×3
SUT SILK 3-0 18XBRD TIE 12 (SUTURE) ×1 IMPLANT
SUT SILK 4 0 (SUTURE) ×3
SUT SILK 4-0 18XBRD TIE 12 (SUTURE) ×1 IMPLANT
SUT VIC AB 3-0 SH 27 (SUTURE) ×6
SUT VIC AB 3-0 SH 27X BRD (SUTURE) ×2 IMPLANT
SYR 20ML LL LF (SYRINGE) ×3 IMPLANT
SYR 3ML LL SCALE MARK (SYRINGE) ×3 IMPLANT

## 2019-11-25 NOTE — OR Nursing (Addendum)
Pt.'s 2 gold colored necklaces and gold colored wedding band.  given to wife per pt. Request.

## 2019-11-25 NOTE — Op Note (Signed)
OPERATIVE NOTE   PROCEDURE: right brachial axillary arteriovenous graft placement  PRE-OPERATIVE DIAGNOSIS: End Stage Renal Disease  POST-OPERATIVE DIAGNOSIS: End Stage Renal Disease  SURGEON: Hortencia Pilar  ASSISTANT(S): Ms. Hezzie Bump  ANESTHESIA: general  ESTIMATED BLOOD LOSS: <50 cc  FINDING(S): 4 mm brachial artery 7 to 8 mm axillary vein  SPECIMEN(S):  none  INDICATIONS:   [name] is a 59 y.o. male who presents with end stage renal disease.  The patient is scheduled for right brachial axillary AV graft placement.  The patient is aware the risks include but are not limited to: bleeding, infection, steal syndrome, nerve damage, ischemic monomelic neuropathy, failure to mature, and need for additional procedures.  The patient is aware of the risks of the procedure and elects to proceed forward.  DESCRIPTION: After full informed written consent was obtained from the patient, the patient was brought back to the operating room and placed supine upon the operating table.  Prior to induction, the patient received IV antibiotics.   After obtaining adequate anesthesia, the patient was then prepped and draped in the standard fashion for a right arm access procedure.   A first assistant was required to provide a safe and appropriate environment for executing the surgery.  The assistant was integral in providing retraction, exposure, running suture providing suction and in the closing process.   A linear incision was then created along the medial border of the biceps muscle just proximal to the antecubital crease and the brachial artery which was exposed through. The brachial artery was then looped proximally and distally with Silastic Vesseloops. Side branches were controlled with 4-0 silk ties.  Attention was then turned to the exposure of the axillary vein. Linear incision was then created medial to the proximal portion of the biceps at the level of the anterior  axillary crease. The axillary vein was exposed and again looped proximally and distally with Silastic vessel loops. Associated tributaries were also controlled with Silastic Vesseloops.  The Gore tunneler was then delivered onto the field and a subcutaneous path was made from the arterial incision to the venous incision. A 4-7 tapered PTFE propatent graft by Simeon Craft was then pulled through the subcutaneous tunnel. The arterial 4 mm portion was then approximated to the brachial artery. Brachial artery was controlled proximally and distally with the Silastic Vesseloops. Arteriotomy was made with an 11 blade scalpel and extended with Potts scissors and a 6-0 Prolene stay suture was placed. End graft to side brachial artery anastomosis was then fashioned with running CV 6 suture. Flushing maneuvers were performed suture line was hemostatic and the graft was then assessed for proper position and ease of future cannulation. Heparinized saline was infused into the vein and the graft was clamped with a vascular clamp. With the graft pressurized it was approximated to the axillary vein in its native bed and then marked with a surgical marker. The vein was then delivered into the surgical field and controlled with the Silastic vessel loops. Venotomy was then made with an 11 blade scalpel and extended with Potts scissors and a 6-0 Prolene suture was used as stay suture. The the graft was then sewn to the vein in an end graft to side vein fashion using running CV 6 suture.  Flushing maneuvers were performed and the artery was allowed to forward and back bleed.  Flow was then established through the AV graft  There was good  thrill in the venous outflow, and there was 1+  palpable radial pulse.  At this point, I irrigated out the surgical wounds.  There was no further active bleeding.  The subcutaneous tissue was reapproximated with a running stitch of 3-0 Vicryl.  The skin was then reapproximated with a running subcuticular  stitch of 4-0 Vicryl.  The skin was then cleaned, dried, and reinforced with Dermabond.    The patient tolerated this procedure well.   COMPLICATIONS: None  CONDITION: [name]  Office: [phone]   11/25/2019, 1:18 PM

## 2019-11-25 NOTE — Anesthesia Postprocedure Evaluation (Signed)
Anesthesia Post Note  [name]  Procedure(s) Performed: INSERTION OF ARTERIOVENOUS (AV) GORE-TEX GRAFT ARM (Right Arm Upper)  Patient location during evaluation: PACU Anesthesia Type: General Level of consciousness: awake and alert and oriented Pain management: pain level controlled Vital Signs Assessment: post-procedure vital signs reviewed and stable Respiratory status: spontaneous breathing, nonlabored ventilation and respiratory function stable Cardiovascular status: blood pressure returned to baseline and stable Postop Assessment: no signs of nausea or vomiting Anesthetic complications: no   No complications documented.   Last Vitals:  Vitals:   11/25/19 1358 11/25/19 1416  BP: 126/78 121/71  Pulse: 64 65  Resp: 15 16  Temp:  (!) 36 C  SpO2: 100% 98%    Last Pain:  Vitals:   11/25/19 1416  TempSrc: Temporal  PainSc: 0-No pain                 Nayomi Tabron

## 2019-11-25 NOTE — Transfer of Care (Signed)
Immediate Anesthesia Transfer of Care Note  [name]  Procedure(s) Performed: INSERTION OF ARTERIOVENOUS (AV) GORE-TEX GRAFT ARM (Right Arm Upper)  Patient Location: PACU  Anesthesia Type:General  Level of Consciousness: drowsy  Airway & Oxygen Therapy: Patient Spontanous Breathing and Patient connected to face mask oxygen  Post-op Assessment: Report given to RN and Post -op Vital signs reviewed and stable  Post vital signs: Reviewed and stable  Last Vitals:  Vitals Value Taken Time  BP 113/65 11/25/19 1328  Temp    Pulse 51 11/25/19 1329  Resp 16 11/25/19 1329  SpO2 100 % 11/25/19 1329  Vitals shown include unvalidated device data.  Last Pain:  Vitals:   11/25/19 0901  TempSrc: Oral  PainSc: 0-No pain         Complications: No complications documented.

## 2019-11-25 NOTE — Anesthesia Procedure Notes (Signed)
Procedure Name: LMA Insertion Date/Time: 11/25/2019 11:18 AM Performed by: Justus Memory, CRNA Pre-anesthesia Checklist: Patient identified, Patient being monitored, Timeout performed, Emergency Drugs available and Suction available Patient Re-evaluated:Patient Re-evaluated prior to induction Oxygen Delivery Method: Circle system utilized Preoxygenation: Pre-oxygenation with 100% oxygen Induction Type: IV induction Ventilation: Mask ventilation without difficulty LMA: LMA inserted LMA Size: 4.5 Number of attempts: 1 Placement Confirmation: positive ETCO2 and breath sounds checked- equal and bilateral Tube secured with: Tape Dental Injury: Teeth and Oropharynx as per pre-operative assessment  Comments: AirQ LMA

## 2019-11-25 NOTE — Anesthesia Preprocedure Evaluation (Addendum)
Anesthesia Evaluation  Patient identified by MRN, date of birth, ID band Patient awake    Reviewed: Allergy & Precautions, NPO status , Patient's Chart, lab work & pertinent test results  History of Anesthesia Complications Negative for: history of anesthetic complications  Airway Mallampati: II  TM Distance: >3 FB Neck ROM: Full    Dental  (+) Edentulous Upper, Edentulous Lower   Pulmonary neg sleep apnea, neg COPD, Current Smoker and Patient abstained from smoking.,    breath sounds clear to auscultation- rhonchi (-) wheezing      Cardiovascular hypertension, Pt. on medications (-) CAD, (-) Past MI, (-) Cardiac Stents and (-) CABG  Rhythm:Regular Rate:Normal - Systolic murmurs and - Diastolic murmurs Echo 7/41/42: MODERATE LV SYSTOLIC DYSFUNCTION (See above)  WITH MILD LVH  NORMAL RIGHT VENTRICULAR SYSTOLIC FUNCTION  MILD VALVULAR REGURGITATION (See above)  NO VALVULAR STENOSIS  MILD to MODERATE MR  MILD AR, TR, PR  EF 35-40%     Neuro/Psych neg Seizures PSYCHIATRIC DISORDERS Depression CVA    GI/Hepatic Neg liver ROS, GERD  ,  Endo/Other  diabetes, Insulin Dependent  Renal/GU ESRF and DialysisRenal disease     Musculoskeletal negative musculoskeletal ROS (+)   Abdominal (+) - obese,   Peds  Hematology  (+) anemia ,   Anesthesia Other Findings Past Medical History: No date: Chronic kidney disease No date: Diabetes mellitus without complication (HCC) No date: ETOH abuse No date: GERD (gastroesophageal reflux disease) No date: Hyperlipidemia No date: Hypertension No date: Stroke [facility])   Reproductive/Obstetrics                            Anesthesia Physical Anesthesia Plan  ASA: IV  Anesthesia Plan: General   Post-op Pain Management:    Induction: Intravenous  PONV Risk Score and Plan: 0 and Ondansetron  Airway Management Planned: LMA  Additional Equipment:    Intra-op Plan:   Post-operative Plan:   Informed Consent: I have reviewed the patients History and Physical, chart, labs and discussed the procedure including the risks, benefits and alternatives for the proposed anesthesia with the patient or authorized representative who has indicated his/her understanding and acceptance.     Dental advisory given  Plan Discussed with: CRNA and Anesthesiologist  Anesthesia Plan Comments:         Anesthesia Quick Evaluation

## 2019-11-25 NOTE — Discharge Instructions (Signed)

## 2019-11-25 NOTE — H&P (Signed)
[facility] Admission History & Physical  MRN : [id number]  [name] is a 59 y.o. (1961-02-05) male who presents with chief complaint of No chief complaint on file. [name]  History of Present Illness:   The patient is seen for evaluation of dialysis access.  The patient has a history of multiple failed accesses.  This is his first arm access.  He is left handed  Current access is via a catheter which is functioning poorly.  There have not been any episodes of catheter infection.  The patient denies amaurosis fugax or recent TIA symptoms. There are no recent neurological changes noted. The patient denies claudication symptoms or rest pain symptoms. The patient denies history of DVT, PE or superficial thrombophlebitis.  The patient denies recent episodes of angina or shortness of breath. He was recently seen by Dr Clayborn Bigness and is felt to be stable.    Current Facility-Administered Medications  Medication Dose Route Frequency Provider Last Rate Last Admin  . 0.9 %  sodium chloride infusion   Intravenous Continuous Arita Miss, MD 10 mL/hr at 11/25/19 0933 10 mL/hr at 11/25/19 0933  . ceFAZolin (ANCEF) 1-4 GM/50ML-% IVPB           . ceFAZolin (ANCEF) IVPB 1 g/50 mL premix  1 g Intravenous On Call to [place], [employer], NP      . Chlorhexidine Gluconate Cloth 2 % PADS 6 each  6 each Topical Once Kris Hartmann, NP       And  . Chlorhexidine Gluconate Cloth 2 % PADS 6 each  6 each Topical Once Eulogio Ditch E, NP      . HYDROmorphone (DILAUDID) injection 1 mg  1 mg Intravenous Once PRN Eulogio Ditch E, NP      . insulin aspart (novoLOG) 100 UNIT/ML injection           . ondansetron (ZOFRAN) injection 4 mg  4 mg Intravenous Q6H PRN Kris Hartmann, NP        Past Medical History:  Diagnosis Date  . Chronic kidney disease   . Diabetes mellitus without complication ([place])   . ETOH abuse   . GERD (gastroesophageal reflux disease)   . Hyperlipidemia   .  Hypertension   . Stroke [facility])     Past Surgical History:  Procedure Laterality Date  . COLONOSCOPY WITH PROPOFOL N/A 03/06/2019   Procedure: COLONOSCOPY WITH PROPOFOL;  Surgeon: Lucilla Lame, MD;  Location: [facility] ENDOSCOPY;  Service: Endoscopy;  Laterality: N/A;  . DIALYSIS/PERMA CATHETER INSERTION N/A 04/10/2019   Procedure: DIALYSIS/PERMA CATHETER INSERTION;  Surgeon: Serafina Mitchell, MD;  Location: [place] CV LAB;  Service: Cardiovascular;  Laterality: N/A;  . ESOPHAGOGASTRODUODENOSCOPY (EGD) WITH PROPOFOL N/A 07/01/2016   Procedure: ESOPHAGOGASTRODUODENOSCOPY (EGD) WITH PROPOFOL;  Surgeon: San Jetty, MD;  Location: [facility] ENDOSCOPY;  Service: General;  Laterality: N/A;    Social History Social History   Tobacco Use  . Smoking status: Current Every Day Smoker    Packs/day: 0.25    Years: 5.00    Pack years: 1.25    Types: Cigars  . Smokeless tobacco: Never Used  Vaping Use  . Vaping Use: Never used  Substance Use Topics  . Alcohol use: Yes    Alcohol/week: 16.0 standard drinks    Types: 14 Cans of beer, 2 Shots of liquor per week  . Drug use: No    Family History Family History  Problem Relation Age of Onset  . CAD Brother   .  Dementia Mother   . Renal Disease Father     No family history of bleeding or clotting disorders, autoimmune disease or porphyria  Allergies  Allergen Reactions  . Ferrous Gluconate Nausea And Vomiting  . Other      REVIEW OF SYSTEMS (Negative unless checked)  Constitutional: [] Weight loss  [] Fever  [] Chills Cardiac: [] Chest pain   [] Chest pressure   [] Palpitations   [] Shortness of breath when laying flat   [] Shortness of breath at rest   [x] Shortness of breath with exertion. Vascular:  [] Pain in legs with walking   [] Pain in legs at rest   [] Pain in legs when laying flat   [] Claudication   [] Pain in feet when walking  [] Pain in feet at rest  [] Pain in feet when laying flat   [] History of DVT   [] Phlebitis   [] Swelling in legs    [] Varicose veins   [] Non-healing ulcers Pulmonary:   [] Uses home oxygen   [] Productive cough   [] Hemoptysis   [] Wheeze  [] COPD   [] Asthma Neurologic:  [] Dizziness  [] Blackouts   [] Seizures   [] History of stroke   [] History of TIA  [] Aphasia   [] Temporary blindness   [] Dysphagia   [] Weakness or numbness in arms   [] Weakness or numbness in legs Musculoskeletal:  [] Arthritis   [] Joint swelling   [] Joint pain   [] Low back pain Hematologic:  [] Easy bruising  [] Easy bleeding   [] Hypercoagulable state   [] Anemic  [] Hepatitis Gastrointestinal:  [] Blood in stool   [] Vomiting blood  [] Gastroesophageal reflux/heartburn   [] Difficulty swallowing. Genitourinary:  [x] Chronic kidney disease   [] Difficult urination  [] Frequent urination  [] Burning with urination   [] Blood in urine Skin:  [] Rashes   [] Ulcers   [] Wounds Psychological:  [] History of anxiety   []  History of major depression.  Physical Examination  Vitals:   11/25/19 0901  BP: (!) 153/95  Resp: 15  Temp: 97.8 F (36.6 C)  TempSrc: Oral  SpO2: 94%  Weight: 72.6 kg  Height: 5\' 11"  (1.803 m)   Body mass index is 22.32 kg/m. Gen: WD/WN, NAD Head: [place]/AT, No temporalis wasting. Prominent temp pulse not noted. Ear/Nose/Throat: Hearing grossly intact, nares w/o erythema or drainage, oropharynx w/o Erythema/Exudate,  Eyes: Conjunctiva clear, sclera non-icteric Neck: Trachea midline.  No JVD.  Pulmonary:  Good air movement, respirations not labored, no use of accessory muscles.  Cardiac: RRR, normal S1, S2. Vascular: right IJ catheter CD&I Vessel Right Left  Radial Palpable Palpable  Ulnar Not Palpable Not Palpable  Brachial Palpable Palpable  Carotid Palpable, without bruit Palpable, without bruit  Gastrointestinal: soft, non-tender/non-distended. No guarding/reflex.  Musculoskeletal: M/S 5/5 throughout.  Extremities without ischemic changes.  No deformity or atrophy.  Neurologic: Sensation grossly intact in extremities.  Symmetrical.   Speech is fluent. Motor exam as listed above. Psychiatric: Judgment intact, Mood & affect appropriate for pt's clinical situation. Dermatologic: No rashes or ulcers noted.  No cellulitis or open wounds. Lymph : No Cervical, Axillary, or Inguinal lymphadenopathy.   CBC Lab Results  Component Value Date   WBC 4.6 11/25/2019   HGB 9.1 (L) 11/25/2019   HCT 28.0 (L) 11/25/2019   MCV 93.0 11/25/2019   PLT 157 11/25/2019    BMET    Component Value Date/Time   NA 137 11/25/2019 1003   NA 143 01/20/2014 0443   K 3.0 (L) 11/25/2019 1003   K 4.6 01/20/2014 0443   CL 99 11/25/2019 1003   CL 116 (H) 01/20/2014 0443   CO2  26 11/25/2019 1003   CO2 22 01/20/2014 0443   GLUCOSE 351 (H) 11/25/2019 1003   GLUCOSE 94 01/20/2014 0443   BUN 37 (H) 11/25/2019 1003   BUN 18 01/20/2014 0443   CREATININE 4.01 (H) 11/25/2019 1003   CREATININE 1.32 (H) 01/20/2014 0443   CALCIUM 8.2 (L) 11/25/2019 1003   CALCIUM 7.3 (L) 01/20/2014 0443   GFRNONAA 15 (L) 11/25/2019 1003   GFRNONAA >60 01/20/2014 0443   GFRAA 18 (L) 11/25/2019 1003   GFRAA >60 01/20/2014 0443   Estimated Creatinine Clearance: 20.4 mL/min (A) (by C-G formula based on SCr of 4.01 mg/dL (H)).  COAG Lab Results  Component Value Date   INR 1.1 11/25/2019   INR 1.0 07/17/2019   INR 0.93 02/26/2018    Radiology DG Foot Complete Left  Result Date: 11/09/2019 Please see detailed radiograph report in office note.   Assessment/Plan 1.  Complication dialysis device with thrombosis AV access:  Patient's catheter is his sole access. The patient will undergo creation of a right brachial axillary AV graft as he is left handed.  The risks and benefits were described to the patient.  All questions were answered.  The patient agrees to proceed with angiography and intervention. Potassium will be drawn to ensure that it is an appropriate level prior to performing intervention. 2.  End-stage renal disease requiring hemodialysis:  Patient will  continue dialysis therapy without further interruption if a successful intervention is not achieved then a tunneled catheter will be placed. Dialysis has already been arranged. 3.  Hypertension:  Patient will continue medical management; nephrology is following no changes in oral medications. 4. Diabetes mellitus:  Glucose will be monitored and oral medications been held this morning once the patient has undergone the patient's procedure po intake will be reinitiated and again Accu-Cheks will be used to assess the blood glucose level and treat as needed. The patient will be restarted on the patient's usual hypoglycemic regime 5.  Coronary artery disease:  EKG will be monitored. Nitrates will be used if needed. The patient's oral cardiac medications will be continued.    Hortencia Pilar, MD  11/25/2019 11:04 AM

## 2019-11-26 ENCOUNTER — Encounter: Payer: Self-pay | Admitting: Vascular Surgery

## 2019-11-27 ENCOUNTER — Telehealth: Payer: Self-pay

## 2019-11-27 ENCOUNTER — Ambulatory Visit: Payer: Self-pay | Admitting: *Deleted

## 2019-11-27 NOTE — Telephone Encounter (Signed)
PC to pt.  Unable to reach at this time.  Left vm to return call to Arbie Cookey, RN at [facility], @ [phone], re: matter related to recent procedure at [facility].  When pt. Returns call, please transfer to Triage Nurse.

## 2019-11-27 NOTE — Telephone Encounter (Signed)
Patient returned call from Arbie Cookey, Therapist, sports for advisement of water issues in city of [place]. Precautions reviewed with patient after recent procedure on 11/25/19. Instructed patient to follow up with PCP if developing symptoms N/V/D occur or to seek help at urgent care or ED if symptoms worsen. Patient verbalized understanding of advise.

## 2019-12-06 ENCOUNTER — Inpatient Hospital Stay
Admission: EM | Admit: 2019-12-06 | Discharge: 2019-12-13 | DRG: 252 | Disposition: A | Discharge status: Home / Self Care | Payer: Medicare Other | Attending: Internal Medicine | Admitting: Internal Medicine

## 2019-12-06 ENCOUNTER — Other Ambulatory Visit: Payer: Self-pay

## 2019-12-06 ENCOUNTER — Emergency Department: Payer: Medicare Other

## 2019-12-06 ENCOUNTER — Encounter: Payer: Self-pay | Admitting: Emergency Medicine

## 2019-12-06 DIAGNOSIS — I10 Essential (primary) hypertension: Secondary | ICD-10-CM | POA: Diagnosis not present

## 2019-12-06 DIAGNOSIS — J9601 Acute respiratory failure with hypoxia: Secondary | ICD-10-CM | POA: Diagnosis present

## 2019-12-06 DIAGNOSIS — I69351 Hemiplegia and hemiparesis following cerebral infarction affecting right dominant side: Secondary | ICD-10-CM

## 2019-12-06 DIAGNOSIS — Z794 Long term (current) use of insulin: Secondary | ICD-10-CM

## 2019-12-06 DIAGNOSIS — I132 Hypertensive heart and chronic kidney disease with heart failure and with stage 5 chronic kidney disease, or end stage renal disease: Secondary | ICD-10-CM | POA: Diagnosis present

## 2019-12-06 DIAGNOSIS — L97529 Non-pressure chronic ulcer of other part of left foot with unspecified severity: Secondary | ICD-10-CM | POA: Diagnosis present

## 2019-12-06 DIAGNOSIS — I639 Cerebral infarction, unspecified: Secondary | ICD-10-CM | POA: Diagnosis present

## 2019-12-06 DIAGNOSIS — E1122 Type 2 diabetes mellitus with diabetic chronic kidney disease: Secondary | ICD-10-CM | POA: Diagnosis present

## 2019-12-06 DIAGNOSIS — D631 Anemia in chronic kidney disease: Secondary | ICD-10-CM | POA: Diagnosis present

## 2019-12-06 DIAGNOSIS — E1165 Type 2 diabetes mellitus with hyperglycemia: Secondary | ICD-10-CM | POA: Diagnosis present

## 2019-12-06 DIAGNOSIS — N2581 Secondary hyperparathyroidism of renal origin: Secondary | ICD-10-CM | POA: Diagnosis present

## 2019-12-06 DIAGNOSIS — E11628 Type 2 diabetes mellitus with other skin complications: Secondary | ICD-10-CM | POA: Diagnosis present

## 2019-12-06 DIAGNOSIS — Z992 Dependence on renal dialysis: Secondary | ICD-10-CM | POA: Diagnosis not present

## 2019-12-06 DIAGNOSIS — K219 Gastro-esophageal reflux disease without esophagitis: Secondary | ICD-10-CM | POA: Diagnosis present

## 2019-12-06 DIAGNOSIS — N186 End stage renal disease: Secondary | ICD-10-CM

## 2019-12-06 DIAGNOSIS — M86672 Other chronic osteomyelitis, left ankle and foot: Secondary | ICD-10-CM | POA: Diagnosis present

## 2019-12-06 DIAGNOSIS — L089 Local infection of the skin and subcutaneous tissue, unspecified: Secondary | ICD-10-CM

## 2019-12-06 DIAGNOSIS — F329 Major depressive disorder, single episode, unspecified: Secondary | ICD-10-CM | POA: Diagnosis present

## 2019-12-06 DIAGNOSIS — J810 Acute pulmonary edema: Secondary | ICD-10-CM

## 2019-12-06 DIAGNOSIS — E11649 Type 2 diabetes mellitus with hypoglycemia without coma: Secondary | ICD-10-CM | POA: Diagnosis present

## 2019-12-06 DIAGNOSIS — F1010 Alcohol abuse, uncomplicated: Secondary | ICD-10-CM | POA: Diagnosis present

## 2019-12-06 DIAGNOSIS — Z79899 Other long term (current) drug therapy: Secondary | ICD-10-CM

## 2019-12-06 DIAGNOSIS — E11621 Type 2 diabetes mellitus with foot ulcer: Secondary | ICD-10-CM | POA: Diagnosis present

## 2019-12-06 DIAGNOSIS — E43 Unspecified severe protein-calorie malnutrition: Secondary | ICD-10-CM | POA: Diagnosis present

## 2019-12-06 DIAGNOSIS — E1152 Type 2 diabetes mellitus with diabetic peripheral angiopathy with gangrene: Principal | ICD-10-CM | POA: Diagnosis present

## 2019-12-06 DIAGNOSIS — E1169 Type 2 diabetes mellitus with other specified complication: Secondary | ICD-10-CM | POA: Diagnosis present

## 2019-12-06 DIAGNOSIS — J439 Emphysema, unspecified: Secondary | ICD-10-CM | POA: Diagnosis present

## 2019-12-06 DIAGNOSIS — IMO0002 Reserved for concepts with insufficient information to code with codable children: Secondary | ICD-10-CM | POA: Diagnosis present

## 2019-12-06 DIAGNOSIS — Z20822 Contact with and (suspected) exposure to covid-19: Secondary | ICD-10-CM | POA: Diagnosis present

## 2019-12-06 DIAGNOSIS — I509 Heart failure, unspecified: Secondary | ICD-10-CM | POA: Diagnosis present

## 2019-12-06 DIAGNOSIS — F17290 Nicotine dependence, other tobacco product, uncomplicated: Secondary | ICD-10-CM | POA: Diagnosis present

## 2019-12-06 DIAGNOSIS — I70262 Atherosclerosis of native arteries of extremities with gangrene, left leg: Secondary | ICD-10-CM | POA: Diagnosis not present

## 2019-12-06 DIAGNOSIS — G9341 Metabolic encephalopathy: Secondary | ICD-10-CM | POA: Diagnosis present

## 2019-12-06 DIAGNOSIS — K863 Pseudocyst of pancreas: Secondary | ICD-10-CM | POA: Diagnosis present

## 2019-12-06 DIAGNOSIS — Z7982 Long term (current) use of aspirin: Secondary | ICD-10-CM

## 2019-12-06 DIAGNOSIS — E785 Hyperlipidemia, unspecified: Secondary | ICD-10-CM | POA: Diagnosis present

## 2019-12-06 DIAGNOSIS — Z681 Body mass index (BMI) 19 or less, adult: Secondary | ICD-10-CM

## 2019-12-06 DIAGNOSIS — I96 Gangrene, not elsewhere classified: Secondary | ICD-10-CM | POA: Diagnosis not present

## 2019-12-06 DIAGNOSIS — Z841 Family history of disorders of kidney and ureter: Secondary | ICD-10-CM

## 2019-12-06 DIAGNOSIS — E8779 Other fluid overload: Secondary | ICD-10-CM

## 2019-12-06 LAB — COMPREHENSIVE METABOLIC PANEL
ALT: 38 U/L (ref 0–44)
AST: 43 U/L — ABNORMAL HIGH (ref 15–41)
Albumin: 3.4 g/dL — ABNORMAL LOW (ref 3.5–5.0)
Alkaline Phosphatase: 294 U/L — ABNORMAL HIGH (ref 38–126)
Anion gap: 12 (ref 5–15)
BUN: 46 mg/dL — ABNORMAL HIGH (ref 6–20)
CO2: 30 mmol/L (ref 22–32)
Calcium: 8.9 mg/dL (ref 8.9–10.3)
Chloride: 97 mmol/L — ABNORMAL LOW (ref 98–111)
Creatinine, Ser: 3.85 mg/dL — ABNORMAL HIGH (ref 0.61–1.24)
GFR calc Af Amer: 18 mL/min — ABNORMAL LOW (ref >60)
GFR calc non Af Amer: 16 mL/min — ABNORMAL LOW (ref >60)
Glucose, Bld: 53 mg/dL — ABNORMAL LOW (ref 70–99)
Potassium: 3.8 mmol/L (ref 3.5–5.1)
Sodium: 139 mmol/L (ref 135–145)
Total Bilirubin: 0.7 mg/dL (ref 0.3–1.2)
Total Protein: 7.4 g/dL (ref 6.5–8.1)

## 2019-12-06 LAB — GLUCOSE, CAPILLARY
Glucose-Capillary: <10 mg/dL — CL (ref 70–99)
Glucose-Capillary: 128 mg/dL — ABNORMAL HIGH (ref 70–99)
Glucose-Capillary: 90 mg/dL (ref 70–99)
Glucose-Capillary: 88 mg/dL (ref 70–99)
Glucose-Capillary: 21 mg/dL — CL (ref 70–99)
Glucose-Capillary: 91 mg/dL (ref 70–99)
Glucose-Capillary: 105 mg/dL — ABNORMAL HIGH (ref 70–99)
Glucose-Capillary: 106 mg/dL — ABNORMAL HIGH (ref 70–99)
Glucose-Capillary: 116 mg/dL — ABNORMAL HIGH (ref 70–99)
Glucose-Capillary: 106 mg/dL — ABNORMAL HIGH (ref 70–99)
Glucose-Capillary: 93 mg/dL (ref 70–99)

## 2019-12-06 LAB — CBC
HCT: 29.9 % — ABNORMAL LOW (ref 39.0–52.0)
Hemoglobin: 9.5 g/dL — ABNORMAL LOW (ref 13.0–17.0)
MCH: 31.3 pg (ref 26.0–34.0)
MCHC: 31.8 g/dL (ref 30.0–36.0)
MCV: 98.4 fL (ref 80.0–100.0)
Platelets: 230 10*3/uL (ref 150–400)
RBC: 3.04 MIL/uL — ABNORMAL LOW (ref 4.22–5.81)
RDW: 16 % — ABNORMAL HIGH (ref 11.5–15.5)
WBC: 7 10*3/uL (ref 4.0–10.5)
nRBC: 0 % (ref 0.0–0.2)

## 2019-12-06 LAB — SARS CORONAVIRUS 2 BY RT PCR (HOSPITAL ORDER, PERFORMED IN ~~LOC~~ HOSPITAL LAB): SARS Coronavirus 2: NEGATIVE

## 2019-12-06 LAB — BLOOD GAS, VENOUS
Acid-Base Excess: 3 mmol/L — ABNORMAL HIGH (ref 0.0–2.0)
Bicarbonate: 30.5 mmol/L — ABNORMAL HIGH (ref 20.0–28.0)
O2 Saturation: 42.6 %
Patient temperature: 37
pCO2, Ven: 62 mmHg — ABNORMAL HIGH (ref 44.0–60.0)
pH, Ven: 7.3 (ref 7.250–7.430)
pO2, Ven: <31 mmHg — CL (ref 32.0–45.0)

## 2019-12-06 LAB — LACTIC ACID, PLASMA
Lactic Acid, Venous: 2.3 mmol/L (ref 0.5–1.9)
Lactic Acid, Venous: 1.4 mmol/L (ref 0.5–1.9)

## 2019-12-06 LAB — PROCALCITONIN: Procalcitonin: 0.68 ng/mL

## 2019-12-06 MED ORDER — HYDRALAZINE HCL 50 MG PO TABS
25.0000 mg | ORAL_TABLET | Freq: Three times a day (TID) | ORAL | Status: DC
  Administered 2019-12-07 – 2019-12-13 (×16): 25 mg via ORAL
  Filled 2019-12-06 (×17): qty 1

## 2019-12-06 MED ORDER — SODIUM CHLORIDE 0.9 % IV SOLN
250.0000 mL | INTRAVENOUS | Status: DC | PRN
  Administered 2019-12-10 – 2019-12-12 (×5): 250 mL via INTRAVENOUS

## 2019-12-06 MED ORDER — SODIUM CHLORIDE 0.9 % IV SOLN
500.0000 mg | Freq: Once | INTRAVENOUS | Status: AC
  Administered 2019-12-06: 500 mg via INTRAVENOUS
  Filled 2019-12-06: qty 500

## 2019-12-06 MED ORDER — SODIUM CHLORIDE 0.9 % IV SOLN
2.0000 g | Freq: Once | INTRAVENOUS | Status: AC
  Administered 2019-12-06: 2 g via INTRAVENOUS
  Filled 2019-12-06: qty 20

## 2019-12-06 MED ORDER — DEXTROSE 50 % IV SOLN: 1.0000 | Freq: Once | INTRAVENOUS | Status: AC

## 2019-12-06 MED ORDER — MELATONIN 5 MG PO TABS
2.5000 mg | ORAL_TABLET | Freq: Every day | ORAL | Status: DC
  Administered 2019-12-07 – 2019-12-12 (×7): 2.5 mg via ORAL
  Filled 2019-12-06 (×9): qty 0.5

## 2019-12-06 MED ORDER — IOHEXOL 350 MG/ML SOLN
75.0000 mL | Freq: Once | INTRAVENOUS | Status: AC | PRN
  Administered 2019-12-06: 75 mL via INTRAVENOUS

## 2019-12-06 MED ORDER — ENOXAPARIN SODIUM 40 MG/0.4ML ~~LOC~~ SOLN: 40.0000 mg | SUBCUTANEOUS | Status: DC

## 2019-12-06 MED ORDER — DEXTROSE 50 % IV SOLN
1.0000 | Freq: Once | INTRAVENOUS | Status: AC
  Administered 2019-12-06: 50 mL via INTRAVENOUS

## 2019-12-06 MED ORDER — SODIUM CHLORIDE 0.9% FLUSH: 3.0000 mL | INTRAVENOUS | Status: DC | PRN

## 2019-12-06 MED ORDER — INSULIN ASPART 100 UNIT/ML ~~LOC~~ SOLN
0.0000 [IU] | Freq: Three times a day (TID) | SUBCUTANEOUS | Status: DC
  Administered 2019-12-07 (×3): 4 [IU] via SUBCUTANEOUS
  Administered 2019-12-08 – 2019-12-09 (×2): 3 [IU] via SUBCUTANEOUS
  Administered 2019-12-09: 16:00:00 5 [IU] via SUBCUTANEOUS
  Administered 2019-12-11: 6 [IU] via SUBCUTANEOUS
  Administered 2019-12-11: 12:00:00 4 [IU] via SUBCUTANEOUS
  Administered 2019-12-13: 09:00:00 1 [IU] via SUBCUTANEOUS
  Filled 2019-12-06 (×9): qty 1

## 2019-12-06 MED ORDER — DEXTROSE 50 % IV SOLN
INTRAVENOUS | Status: AC
  Administered 2019-12-06: 50 mL via INTRAVENOUS
  Filled 2019-12-06: qty 50

## 2019-12-06 MED ORDER — SACUBITRIL-VALSARTAN 24-26 MG PO TABS
1.0000 | ORAL_TABLET | Freq: Two times a day (BID) | ORAL | Status: DC
  Administered 2019-12-07 – 2019-12-13 (×12): 1 via ORAL
  Filled 2019-12-06 (×16): qty 1

## 2019-12-06 MED ORDER — PANTOPRAZOLE SODIUM 40 MG PO TBEC
40.0000 mg | DELAYED_RELEASE_TABLET | Freq: Every day | ORAL | Status: DC
  Administered 2019-12-07 – 2019-12-13 (×6): 40 mg via ORAL
  Filled 2019-12-06 (×6): qty 1

## 2019-12-06 MED ORDER — NEPRO/CARBSTEADY PO LIQD
237.0000 mL | Freq: Three times a day (TID) | ORAL | Status: DC
  Administered 2019-12-06 – 2019-12-12 (×10): 237 mL via ORAL

## 2019-12-06 MED ORDER — FUROSEMIDE 10 MG/ML IJ SOLN
80.0000 mg | Freq: Once | INTRAMUSCULAR | Status: AC
  Administered 2019-12-06: 80 mg via INTRAVENOUS
  Filled 2019-12-06: qty 8

## 2019-12-06 MED ORDER — CARVEDILOL 3.125 MG PO TABS
6.2500 mg | ORAL_TABLET | Freq: Two times a day (BID) | ORAL | Status: DC
  Administered 2019-12-06 – 2019-12-13 (×10): 6.25 mg via ORAL
  Filled 2019-12-06 (×3): qty 2
  Filled 2019-12-06: qty 1
  Filled 2019-12-06 (×2): qty 2
  Filled 2019-12-06: qty 1
  Filled 2019-12-06 (×3): qty 2

## 2019-12-06 MED ORDER — IPRATROPIUM-ALBUTEROL 0.5-2.5 (3) MG/3ML IN SOLN
3.0000 mL | Freq: Four times a day (QID) | RESPIRATORY_TRACT | Status: AC
  Administered 2019-12-06 – 2019-12-07 (×2): 3 mL via RESPIRATORY_TRACT
  Filled 2019-12-06 (×2): qty 3

## 2019-12-06 MED ORDER — SERTRALINE HCL 50 MG PO TABS
100.0000 mg | ORAL_TABLET | Freq: Every day | ORAL | Status: DC
  Administered 2019-12-07 – 2019-12-13 (×6): 100 mg via ORAL
  Filled 2019-12-06 (×6): qty 2

## 2019-12-06 MED ORDER — AMLODIPINE BESYLATE 5 MG PO TABS
5.0000 mg | ORAL_TABLET | Freq: Every day | ORAL | Status: DC
  Administered 2019-12-07 – 2019-12-13 (×4): 5 mg via ORAL
  Filled 2019-12-06 (×4): qty 1

## 2019-12-06 MED ORDER — ASPIRIN EC 81 MG PO TBEC
81.0000 mg | DELAYED_RELEASE_TABLET | Freq: Every day | ORAL | Status: DC
  Administered 2019-12-07 – 2019-12-13 (×5): 81 mg via ORAL
  Filled 2019-12-06 (×5): qty 1

## 2019-12-06 MED ORDER — ENOXAPARIN SODIUM 30 MG/0.3ML ~~LOC~~ SOLN
30.0000 mg | SUBCUTANEOUS | Status: DC
  Filled 2019-12-06: qty 0.3

## 2019-12-06 MED ORDER — SODIUM CHLORIDE 0.9% FLUSH
3.0000 mL | Freq: Two times a day (BID) | INTRAVENOUS | Status: DC
  Administered 2019-12-07 – 2019-12-13 (×11): 3 mL via INTRAVENOUS

## 2019-12-06 MED ORDER — ACETAMINOPHEN 325 MG PO TABS
650.0000 mg | ORAL_TABLET | Freq: Four times a day (QID) | ORAL | Status: DC | PRN
  Administered 2019-12-07 – 2019-12-11 (×2): 650 mg via ORAL
  Filled 2019-12-06 (×2): qty 2

## 2019-12-06 NOTE — H&P (Addendum)
History and Physical:    [name]   TGG:[id number] DOB: [birth year] DOA: 12/06/2019  Referring MD/provider: Dr. Joni Fears PCP: [name], MD   Patient coming from: Home  Chief Complaint: Altered mental status. History is per patient as well as his wife.  History of Present Illness:   [name] is an 59 y.o. male with PMH significant for uncontrolled DM, poorly controlled HTN, ESRD on HD, previous alcohol use no use x1 year who was brought into the ED today with altered mental status. Patient was found by his wife and was unable to be woken up. In the ED he was noted to have blood sugar of 26 and treated with D50 and regained normal mental status. However while he was being evaluated patient was noted to be very hypoxic with O2 saturations of 80% on room air. Patient did admit to shortness of breath and was not sure whether he has had a cough or not. Patient specifically denies any chest pain or pressure. Patient is generally not very compliant with providing a history.  Per patient's wife, patient left dialysis early yesterday (he is normally dialyzed Tuesday Thursday and Saturday) because of diarrhea. Patient has had intermittent diarrhea for several years but decreased lately because it was diagnosed as lactose intolerance. Patient's wife is not sure whether he has had any milk products lately. He has not had any fevers or chills. No new cough as far she is aware however she does note that he has had some wheezing intermittently for the past year or so. He does not have a diagnosis of COPD and does not use inhalers however used to be a significant smoker in his younger days. Patient denies orthopnea or PND. His wife thinks that he may have "a touch of heart failure". Of note patient is on Entresto.   ED Course:  The patient was initially noticed to be profoundly hypoglycemic with blood sugar 26 which responded well to D50 with resumption of normal mental status. Its possible  patient took too much insulin earlier today. Patient was also noted to be significantly hypoxic. CT angiogram revealed cardiomegaly with changes consistent with pulmonary edema, CHF. No PE.  ROS:   ROS   Review of Systems: Patient did not cooperate with review of systems  Past Medical History:   Past Medical History:  Diagnosis Date  . Chronic kidney disease   . Diabetes mellitus without complication ([place])   . ETOH abuse   . GERD (gastroesophageal reflux disease)   . Hyperlipidemia   . Hypertension   . Stroke [facility])     Past Surgical History:   Past Surgical History:  Procedure Laterality Date  . AV FISTULA PLACEMENT Right 11/25/2019   Procedure: INSERTION OF ARTERIOVENOUS (AV) GORE-TEX GRAFT ARM;  Surgeon: Katha Cabal, MD;  Location: [facility] ORS;  Service: Vascular;  Laterality: Right;  . COLONOSCOPY WITH PROPOFOL N/A 03/06/2019   Procedure: COLONOSCOPY WITH PROPOFOL;  Surgeon: Lucilla Lame, MD;  Location: [facility] ENDOSCOPY;  Service: Endoscopy;  Laterality: N/A;  . DIALYSIS/PERMA CATHETER INSERTION N/A 04/10/2019   Procedure: DIALYSIS/PERMA CATHETER INSERTION;  Surgeon: Serafina Mitchell, MD;  Location: [place] CV LAB;  Service: Cardiovascular;  Laterality: N/A;  . ESOPHAGOGASTRODUODENOSCOPY (EGD) WITH PROPOFOL N/A 07/01/2016   Procedure: ESOPHAGOGASTRODUODENOSCOPY (EGD) WITH PROPOFOL;  Surgeon: San Jetty, MD;  Location: [facility] ENDOSCOPY;  Service: General;  Laterality: N/A;    Social History:   Social History   Socioeconomic History  . Marital status: Married  Spouse name: Not on file  . Number of children: Not on file  . Years of education: Not on file  . Highest education level: Not on file  Occupational History  . Not on file  Tobacco Use  . Smoking status: Current Every Day Smoker    Packs/day: 0.25    Years: 5.00    Pack years: 1.25    Types: Cigars  . Smokeless tobacco: Never Used  Vaping Use  . Vaping Use: Never used  Substance and Sexual  Activity  . Alcohol use: Yes    Alcohol/week: 16.0 standard drinks    Types: 14 Cans of beer, 2 Shots of liquor per week  . Drug use: No  . Sexual activity: Yes  Other Topics Concern  . Not on file  Social History Narrative   Lives with wife, works at [employer] Strain:   . Difficulty of Paying Living Expenses:   Food Insecurity:   . Worried About Charity fundraiser in the Last Year:   . Arboriculturist in the Last Year:   Transportation Needs:   . Film/video editor (Medical):   [name] Lack of Transportation (Non-Medical):   Physical Activity:   . Days of Exercise per Week:   . Minutes of Exercise per Session:   Stress:   . Feeling of Stress :   Social Connections:   . Frequency of Communication with Friends and Family:   . Frequency of Social Gatherings with Friends and Family:   . Attends Religious Services:   . Active Member of Clubs or Organizations:   . Attends Archivist Meetings:   [name] Marital Status:   Intimate Partner Violence:   . Fear of Current or Ex-Partner:   . Emotionally Abused:   [name] Physically Abused:   . Sexually Abused:     Allergies   Ferrous gluconate and Other  Family history:   Family History  Problem Relation Age of Onset  . CAD Brother   . Dementia Mother   . Renal Disease Father     Current Medications:   Prior to Admission medications   Medication Sig Start Date End Date Taking? Authorizing Provider  Amino Acid Infusion (PROSOL) 20 % SOLN Inject into the vein. 11/28/19  Yes [provider]  amLODipine (NORVASC) 2.5 MG tablet Take 2.5 mg by mouth daily.   Yes [provider]  aspirin EC 81 MG EC tablet Take 1 tablet (81 mg total) by mouth daily. 06/10/19  Yes Jennye Boroughs, MD  carvedilol (COREG) 6.25 MG tablet Take 6.25 mg by mouth 2 (two) times daily with a meal.  10/28/19  Yes [provider]  ENTRESTO 24-26 MG Take 1 tablet by mouth 2 (two)  times daily. 07/29/19  Yes [provider]  folic acid (FOLVITE) 1 MG tablet Take 1 mg by mouth daily. 03/12/19  Yes [provider]  hydrALAZINE (APRESOLINE) 25 MG tablet Take 1 tablet (25 mg total) by mouth every 8 (eight) hours. Patient taking differently: Take 25 mg by mouth in the morning and at bedtime.  07/08/19  Yes Ezekiel Slocumb, DO  HYDROcodone-acetaminophen (NORCO) 5-325 MG tablet Take 1-2 tablets by mouth every 6 (six) hours as needed for moderate pain or severe pain. 11/25/19  Yes Schnier, Dolores Lory, MD  insulin glargine (LANTUS) 100 UNIT/ML injection Inject 0.1 mLs (10 Units total) into the skin daily. Patient taking differently: Inject 20 Units  into the skin daily.  06/09/19  Yes Jennye Boroughs, MD  insulin lispro (HUMALOG KWIKPEN) 100 UNIT/ML KwikPen Inject 0-0.09 mLs (0-9 Units total) into the skin 3 (three) times daily with meals. According to sliding scale Patient taking differently: Inject 0-15 Units into the skin 3 (three) times daily with meals. According to sliding scale 07/08/19  Yes Nicole Kindred A, DO  lisinopril (ZESTRIL) 30 MG tablet Take by mouth. 10/28/19  Yes [provider]  Melatonin 3 MG TABS Take 1 tablet by mouth at bedtime. 05/23/19  Yes [provider]  metoprolol tartrate (LOPRESSOR) 50 MG tablet Take 1 tablet (50 mg total) by mouth 2 (two) times daily. 07/08/19  Yes Nicole Kindred A, DO  multivitamin (RENA-VIT) TABS tablet Take 1 tablet by mouth at bedtime. 07/08/19  Yes Nicole Kindred A, DO  pantoprazole (PROTONIX) 40 MG tablet Take 40 mg by mouth daily.  06/17/19  Yes [provider]  sertraline (ZOLOFT) 100 MG tablet Take 100 mg by mouth daily.  06/17/19  Yes [provider]  thiamine (VITAMIN B-1) 100 MG tablet Take 100 mg by mouth daily.   Yes [provider]  amLODipine (NORVASC) 5 MG tablet Take 5 mg by mouth daily at 6 (six) AM.  10/28/19   [provider]  Nutritional Supplements  (FEEDING SUPPLEMENT, NEPRO CARB STEADY,) LIQD Take 237 mLs by mouth 3 (three) times daily between meals. 07/08/19   Ezekiel Slocumb, DO    Physical Exam:   Vitals:   12/06/19 1518 12/06/19 1531 12/06/19 1600 12/06/19 1630  BP:  (!) 138/117    Pulse: 56 55 59 62  Resp: 13 13 14 15   Temp:      TempSrc:      SpO2: 95% 98% 100% 96%  Weight:      Height:         Physical Exam: Blood pressure (!) 138/117, pulse 62, temperature 97.8 F (36.6 C), temperature source Oral, resp. rate 15, height 5\' 11"  (1.803 m), weight 72.6 kg, SpO2 96 %. Gen: Appearing much older than stated age lying flat in bed in no acute distress. Patient does not seem to want to talk states he would like to go home. Eyes: sclera anicteric, conjuctiva mildly injected bilaterally CVS: S1-S2, regulary, no gallops Respiratory: Reasonable air entry with rales at bases, he does have a rare low pitched wheeze GI: NABS, soft, NT  LE: No edema. No cyanosis Neuro: A/O x 3, Moving all extremities equally with normal strength, CN 3-12 intact, grossly nonfocal.  Psych: patient is logical and coherent, judgement and insight appear normal, mood and affect appropriate to situation. Skin: no rashes or lesions or ulcers,    Data Review:    Labs: Basic Metabolic Panel: Recent Labs  Lab 12/06/19 1248  NA 139  K 3.8  CL 97*  CO2 30  GLUCOSE 53*  BUN 46*  CREATININE 3.85*  CALCIUM 8.9   Liver Function Tests: Recent Labs  Lab 12/06/19 1248  AST 43*  ALT 38  ALKPHOS 294*  BILITOT 0.7  PROT 7.4  ALBUMIN 3.4*   No results for input(s): LIPASE, AMYLASE in the last 168 hours. No results for input(s): AMMONIA in the last 168 hours. CBC: Recent Labs  Lab 12/06/19 1248  WBC 7.0  HGB 9.5*  HCT 29.9*  MCV 98.4  PLT 230   Cardiac Enzymes: No results for input(s): CKTOTAL, CKMB, CKMBINDEX, TROPONINI in the last 168 hours.  BNP (last 3 results) No results  for input(s): PROBNP in the last 8760 hours. CBG: Recent  Labs  Lab 12/06/19 1232 12/06/19 1352 12/06/19 1518 12/06/19 1653  GLUCAP <10* 128* 90 88    Urinalysis    Component Value Date/Time   COLORURINE YELLOW (A) 07/06/2019 0144   APPEARANCEUR CLOUDY (A) 07/06/2019 0144   APPEARANCEUR Clear 01/17/2014 1551   LABSPEC 1.017 07/06/2019 0144   LABSPEC 1.011 01/17/2014 1551   PHURINE 7.0 07/06/2019 0144   GLUCOSEU >=500 (A) 07/06/2019 0144   GLUCOSEU Negative 01/17/2014 1551   HGBUR SMALL (A) 07/06/2019 0144   BILIRUBINUR NEGATIVE 07/06/2019 0144   BILIRUBINUR Negative 01/17/2014 1551   KETONESUR NEGATIVE 07/06/2019 0144   PROTEINUR >=300 (A) 07/06/2019 0144   NITRITE NEGATIVE 07/06/2019 0144   LEUKOCYTESUR NEGATIVE 07/06/2019 0144   LEUKOCYTESUR 1+ 01/17/2014 1551      Radiographic Studies: CT Angio Chest PE W and/or Wo Contrast  Result Date: 12/06/2019 CLINICAL DATA:  High clinical suspicion of pulmonary embolism. End-stage renal disease on hemodialysis for 2 years. EXAM: CT ANGIOGRAPHY CHEST WITH CONTRAST TECHNIQUE: Multidetector CT imaging of the chest was performed using the standard protocol during bolus administration of intravenous contrast. Multiplanar CT image reconstructions and MIPs were obtained to evaluate the vascular anatomy. CONTRAST:  42mL OMNIPAQUE IOHEXOL 350 MG/ML SOLN COMPARISON:  Noncontrast chest CT 05/27/2019. Abdominal CT 07/17/2019. FINDINGS: Cardiovascular: The pulmonary arteries are well opacified with contrast to the level of the subsegmental branches. There is no evidence of acute pulmonary embolism. Atherosclerosis of the aorta, great vessels and coronary arteries. There is limited opacification of the systemic vessels, but no acute vascular findings are seen. The heart is mildly enlarged. There is a small pericardial effusion. Right IJ hemodialysis catheter extends to the superior cavoatrial junction. Mediastinum/Nodes: Mildly progressive mediastinal and hilar adenopathy, largest a right paratracheal node  measuring 16 mm on image 21/4. No axillary adenopathy. The thyroid gland, trachea and esophagus demonstrate no significant findings. Lungs/Pleura: Moderate-sized dependent pleural effusions bilaterally with associated bibasilar atelectasis. In addition, there are diffusely increased septal markings throughout both lungs, suspicious for edema superimposed on emphysema. No confluent airspace opacity or suspicious pulmonary nodularity. Upper abdomen: Enlarging, incompletely visualized fluid collection in the left upper quadrant of the abdomen, measuring up to 8.0 x 5.2 cm on image 94/4. This lies between the stomach and spleen and has enlarged compared with previous abdominal CT where there was a much smaller collection adjacent to a calcified pancreatic tail. Appearance suggests a pancreatic pseudocyst. Generalized soft tissue edema throughout the upper abdomen without other focal fluid collection. Musculoskeletal/Chest wall: There is no chest wall mass or suspicious osseous finding. Generalized soft tissue edema consistent with anasarca/volume overload. Review of the MIP images confirms the above findings. IMPRESSION: 1. No evidence of acute pulmonary embolism. 2. Cardiomegaly, bilateral pleural effusions and diffuse septal thickening in both lungs most consistent with congestive heart failure/volume overload. Associated generalized soft tissue edema. 3. Mildly progressive mediastinal and hilar adenopathy, likely reactive. 4. Enlarging, incompletely visualized fluid collection in the left upper quadrant of the abdomen, suspicious for a pancreatic pseudocyst. 5. Aortic Atherosclerosis (ICD10-I70.0) and Emphysema (ICD10-J43.9). Electronically Signed   By: Richardean Sale M.D.   On: 12/06/2019 15:37   DG Chest Portable 1 View  Result Date: 12/06/2019 CLINICAL DATA:  49-year-old male with history of cough and hypoxia. EXAM: PORTABLE CHEST 1 VIEW COMPARISON:  Chest x-ray 09/16/2019. FINDINGS: Right-sided PermCath with  tip terminating at the superior cavoatrial junction. There is cephalization of the pulmonary vasculature and  slight indistinctness of the interstitial markings suggestive of mild pulmonary edema. Small left pleural effusion. No right pleural effusion. No pneumothorax. Heart size is mildly enlarged. Upper mediastinal contours are within normal limits. Aortic atherosclerosis. IMPRESSION: 1. The appearance of the chest suggests mild congestive heart failure, as above. 2. Aortic atherosclerosis. Electronically Signed   By: Vinnie Langton M.D.   On: 12/06/2019 13:22    EKG: Has not been done,  will order now.   Assessment/Plan:   Principal Problem:   Acute respiratory failure with hypoxia (HCC) Active Problems:   Uncontrolled diabetes mellitus (HCC)   Alcohol abuse   HTN (hypertension)   CVA (cerebral vascular accident) ([place])   Chronic osteomyelitis of toe of left foot ([place])  60 year old man with poorly controlled diabetes, poorly controlled HTN who signed out AMA from his last admission is admitted with altered mental status improved with D50 for glucose of 26. Patient was noted to be hypoxic and CT scan shows cardiomegaly with pulmonary edema. Patient did not complete his hemodialysis yesterday.  Pulmonary edema Patient did not complete his dialysis yesterday, will consult renal for repeat dialysis as warranted Patient has cardiomegaly and is on Entresto but I do not see an echocardiogram in our system Certainly he has no noncompliance with his antihypertensive so may well have diastolic dysfunction. Will order echocardiogram, BNP in a.m. Lasix 80 mg IV x1 ordered  ESRD on HD Consult placed for nephrology Secure chat for Dr. [name] and Dr. Zollie Scale placed.  Wheezing Patient may well have wheezing from his pulmonary edema However will treat with duo nebs x2. Patient apparently is to be quite a smoker when he was younger, but has cut down significantly lately  HTN Is unclear to me  exactly what meds patient is on despite med rec He seems to be taking both carvedilol and metoprolol It is unclear if he is taking his amlodipine, is apparently written for both 2.5 and 5 mg. He seems to be taking both lisinopril and Entresto And he is taking hydralazine twice daily I will continue patient's carvedilol, Entresto, amlodipine 5 mg and hydralazine He has recently been admitted for hypertensive urgency, will need to follow blood pressure closely  Hypoglycemia/DM2 Given profound hypoglycemia, will hold off on glargine and lispro Will place patient on SSI AC at bedtime and follow fingersticks Glargine can be started depending on how his fingersticks run.  Alcohol use and possible pseudocyst on CT Patient used to have significant alcohol use however according to his wife has stopped drinking since he has been on HD for the past year. Patient has not had a think drink x1 year However patient does seem to have a possible pancreatic pseudocyst on CT, this can be followed up on as an inpatient or outpatient as warranted.  Depression Continue sertraline  Protein calorie malnutrition Continue nutrition supplements    Other information:   DVT prophylaxis: Lovenox ordered. Code Status: Full Family Communication: Patient's wife was at bedside throughout Disposition Plan: Home Consults called: Renal Admission status: Inpatient  Fantasia Jinkins Tublu Akanksha Bellmore [facility]  If 7PM-7AM, please contact night-coverage [email or web address] Password [email or web address] 12/06/2019, 5:25 PM

## 2019-12-06 NOTE — ED Notes (Signed)
Pt to dialysis at this time

## 2019-12-06 NOTE — ED Provider Notes (Signed)
[facility] Emergency Department Provider Note  ____________________________________________  Time seen: Approximately 12:58 PM  I have reviewed the triage vital signs and the nursing notes.   HISTORY  Chief Complaint Hypoglycemia  Level 5 Caveat: Portions of the History and Physical including HPI and review of systems are unable to be completely obtained due to patient acute illness and decreased level of consciousness   HPI [name] is a 59 y.o. male with a history of CKD, diabetes, GERD, hypertension  was brought to the ED by EMS due to hypoglycemia.  Patient woke up this morning, took 3 units of insulin, ate some oatmeal and then felt very tired and went to lay down.  Wife found the patient unable to wake up later on and called EMS.  Initial blood sugar by fire department was 26.  Patient reports that he has had cough and fatigue for the past several days, decreased appetite and decreased oral intake.  Also endorses diarrhea.    Past Medical History:  Diagnosis Date  . Chronic kidney disease   . Diabetes mellitus without complication ([place])   . ETOH abuse   . GERD (gastroesophageal reflux disease)   . Hyperlipidemia   . Hypertension   . Stroke [facility])      Patient Active Problem List   Diagnosis Date Noted  . Paronychia of second toe of left foot 11/09/2019  . Chronic osteomyelitis of toe of left foot ([place]) 11/09/2019  . Uncontrolled type 2 diabetes mellitus with hyperosmolar nonketotic hyperglycemia ([place]) 09/16/2019  . Hypoglycemia associated with diabetes ([place]) 08/24/2019  . Acute respiratory failure with hypoxia ([place])   . Acute metabolic encephalopathy 33/29/5188  . Hypertensive emergency 07/06/2019  . Acute encephalopathy 07/06/2019  . Seizures due to metabolic disorder ([place]) 41/66/0630  . Hyperosmolar hyperglycemic state (HHS) ([place]) 06/05/2019  . Anemia 06/04/2019  . Frequent falls 06/04/2019  . History of CVA (cerebrovascular  accident) 06/04/2019  . HCAP (healthcare-associated pneumonia) 06/04/2019  . Acute blood loss anemia 06/04/2019  . Hyperglycemia 05/28/2019  . Fall at home, initial encounter 05/28/2019  . Rib fracture 05/28/2019  . Depression 05/28/2019  . Hypokalemia 05/28/2019  . Weakness 05/27/2019  . ESRD on hemodialysis ([place])   . Acute renal failure (ARF) ([place]) 04/09/2019  . Polyp of ascending colon   . Diarrhea   . AKI (acute kidney injury) ([place]) 03/04/2019  . Acute kidney injury ([place]) 07/26/2018  . ARF (acute renal failure) ([place]) 07/25/2018  . Bilateral leg numbness 03/19/2018  . Numbness and tingling of both feet 03/19/2018  . CVA (cerebral vascular accident) ([place]) 03/17/2018  . Moderate episode of recurrent major depressive disorder ([place]) 03/11/2018  . UTI (urinary tract infection) 02/27/2018  . Hypoglycemia 01/15/2018  . Type 2 diabetes mellitus without complication, with long-term current use of insulin ([place]) 08/27/2017  . Protein-calorie malnutrition, severe 08/19/2017  . Pancreatitis, acute 08/16/2017  . DKA (diabetic ketoacidoses) ([place]) 08/16/2017  . HTN (hypertension) 08/16/2017  . HLD (hyperlipidemia) 08/16/2017  . Carotid stenosis 08/02/2016  . GERD (gastroesophageal reflux disease) 08/02/2016  . History of esophagogastroduodenoscopy (EGD) 07/01/2016  . History of recent blood transfusion 07/01/2016  . Acute renal failure superimposed on stage 4 chronic kidney disease ([place]) 07/01/2016  . Hyponatremia 07/01/2016  . Uncontrolled diabetes mellitus ([place]) 07/01/2016  . Alcohol abuse 07/01/2016  . Monilial esophagitis ([place]) 07/01/2016  . Tobacco abuse 07/01/2016  . Confusion 06/29/2016  . Iron deficiency anemia 06/29/2016  . Coagulopathy ([place]) 06/29/2016     Past  Surgical History:  Procedure Laterality Date  . AV FISTULA PLACEMENT Right 11/25/2019   Procedure: INSERTION OF ARTERIOVENOUS (AV) GORE-TEX GRAFT ARM;  Surgeon: Katha Cabal, MD;  Location: [facility] ORS;  Service:  Vascular;  Laterality: Right;  . COLONOSCOPY WITH PROPOFOL N/A 03/06/2019   Procedure: COLONOSCOPY WITH PROPOFOL;  Surgeon: Lucilla Lame, MD;  Location: [facility] ENDOSCOPY;  Service: Endoscopy;  Laterality: N/A;  . DIALYSIS/PERMA CATHETER INSERTION N/A 04/10/2019   Procedure: DIALYSIS/PERMA CATHETER INSERTION;  Surgeon: Serafina Mitchell, MD;  Location: [facility] CV LAB;  Service: Cardiovascular;  Laterality: N/A;  . ESOPHAGOGASTRODUODENOSCOPY (EGD) WITH PROPOFOL N/A 07/01/2016   Procedure: ESOPHAGOGASTRODUODENOSCOPY (EGD) WITH PROPOFOL;  Surgeon: San Jetty, MD;  Location: [facility] ENDOSCOPY;  Service: General;  Laterality: N/A;     Prior to Admission medications   Medication Sig Start Date End Date Taking? Authorizing Provider  Amino Acid Infusion (PROSOL) 20 % SOLN Inject into the vein. 11/28/19  Yes [provider]  amLODipine (NORVASC) 2.5 MG tablet Take 2.5 mg by mouth daily.   Yes [provider]  aspirin EC 81 MG EC tablet Take 1 tablet (81 mg total) by mouth daily. 06/10/19  Yes Jennye Boroughs, MD  carvedilol (COREG) 6.25 MG tablet Take 6.25 mg by mouth 2 (two) times daily with a meal.  10/28/19  Yes [provider]  ENTRESTO 24-26 MG Take 1 tablet by mouth 2 (two) times daily. 07/29/19  Yes [provider]  folic acid (FOLVITE) 1 MG tablet Take 1 mg by mouth daily. 03/12/19  Yes [provider]  hydrALAZINE (APRESOLINE) 25 MG tablet Take 1 tablet (25 mg total) by mouth every 8 (eight) hours. Patient taking differently: Take 25 mg by mouth in the morning and at bedtime.  07/08/19  Yes Ezekiel Slocumb, DO  HYDROcodone-acetaminophen (NORCO) 5-325 MG tablet Take 1-2 tablets by mouth every 6 (six) hours as needed for moderate pain or severe pain. 11/25/19  Yes Schnier, Dolores Lory, MD  insulin glargine (LANTUS) 100 UNIT/ML injection Inject 0.1 mLs (10 Units total) into the skin daily. Patient taking differently: Inject 20 Units into the skin daily.   06/09/19  Yes Jennye Boroughs, MD  insulin lispro (HUMALOG KWIKPEN) 100 UNIT/ML KwikPen Inject 0-0.09 mLs (0-9 Units total) into the skin 3 (three) times daily with meals. According to sliding scale Patient taking differently: Inject 0-15 Units into the skin 3 (three) times daily with meals. According to sliding scale 07/08/19  Yes Nicole Kindred A, DO  lisinopril (ZESTRIL) 30 MG tablet Take by mouth. 10/28/19  Yes [provider]  Melatonin 3 MG TABS Take 1 tablet by mouth at bedtime. 05/23/19  Yes [provider]  metoprolol tartrate (LOPRESSOR) 50 MG tablet Take 1 tablet (50 mg total) by mouth 2 (two) times daily. 07/08/19  Yes Nicole Kindred A, DO  multivitamin (RENA-VIT) TABS tablet Take 1 tablet by mouth at bedtime. 07/08/19  Yes Nicole Kindred A, DO  pantoprazole (PROTONIX) 40 MG tablet Take 40 mg by mouth daily.  06/17/19  Yes [provider]  sertraline (ZOLOFT) 100 MG tablet Take 100 mg by mouth daily.  06/17/19  Yes [provider]  thiamine (VITAMIN B-1) 100 MG tablet Take 100 mg by mouth daily.   Yes [provider]  amLODipine (NORVASC) 5 MG tablet Take 5 mg by mouth daily at 6 (six) AM.  10/28/19   [provider]  Nutritional Supplements (FEEDING SUPPLEMENT, NEPRO CARB STEADY,) LIQD Take 237 mLs by mouth 3 (  three) times daily between meals. 07/08/19   Ezekiel Slocumb, DO     Allergies Ferrous gluconate and Other   Family History  Problem Relation Age of Onset  . CAD Brother   . Dementia Mother   . Renal Disease Father     Social History Social History   Tobacco Use  . Smoking status: Current Every Day Smoker    Packs/day: 0.25    Years: 5.00    Pack years: 1.25    Types: Cigars  . Smokeless tobacco: Never Used  Vaping Use  . Vaping Use: Never used  Substance Use Topics  . Alcohol use: Yes    Alcohol/week: 16.0 standard drinks    Types: 14 Cans of beer, 2 Shots of liquor per week  . Drug use: No    Review of  Systems Level 5 Caveat: Portions of the History and Physical including HPI and review of systems are unable to be completely obtained due to patient acute illness and decreased level of consciousness  Constitutional:   No fever or chills.  ENT:   No sore throat. No rhinorrhea. Cardiovascular:   No chest pain or syncope. Respiratory:   Positive shortness of breath and cough. Gastrointestinal:   Negative for abdominal pain, vomiting and diarrhea.  Musculoskeletal:   Negative for focal pain or swelling All other systems reviewed and are negative except as documented above in ROS and HPI.  ____________________________________________   PHYSICAL EXAM:  VITAL SIGNS: ED Triage Vitals [12/06/19 1241]  Enc Vitals Group     BP      Pulse      Resp      Temp      Temp src      SpO2      Weight 160 lb 0.9 oz (72.6 kg)     Height 5\' 11"  (1.803 m)     Head Circumference      Peak Flow      Pain Score 0     Pain Loc      Pain Edu?      Excl. in [place]?     Vital signs reviewed, nursing assessments reviewed.   Constitutional:   Somnolent, arousable.  Oriented to person and place.  Ill-appearing.   Eyes:   Conjunctivae are normal. EOMI. PERRL. ENT      Head:   Normocephalic and atraumatic.      Nose: Normal.      Mouth/Throat:   Dry mucous membranes      Neck:   No meningismus. Full ROM. Hematological/Lymphatic/Immunilogical:   No cervical lymphadenopathy. Cardiovascular:   RRR, heart rate 70. Symmetric bilateral radial and DP pulses.  No murmurs. Cap refill less than 2 seconds. Respiratory:   Normal respiratory effort without tachypnea/retractions.  Room air oxygen saturation of 79%.  Congested sounding cough.  Breath sounds clear bilaterally without crackles or wheezing. Gastrointestinal:   Soft and nontender. Non distended. There is no CVA tenderness.  No rebound, rigidity, or guarding.  Musculoskeletal:   Normal range of motion in all extremities. No joint effusions.  No lower  extremity tenderness.  No edema. Neurologic:   Normal speech and language.  Motor grossly intact. No acute focal neurologic deficits are appreciated.  Skin:    Skin is warm, dry and intact. No rash noted.  No petechiae, purpura, or bullae.  ____________________________________________    LABS (pertinent positives/negatives) (all labs ordered are listed, but only abnormal results are displayed) Labs Reviewed  BLOOD GAS, VENOUS -  Abnormal; Notable for the following components:      Result Value   pCO2, Ven 62 (*)    pO2, Ven <31.0 (*)    Bicarbonate 30.5 (*)    Acid-Base Excess 3.0 (*)    All other components within normal limits  COMPREHENSIVE METABOLIC PANEL - Abnormal; Notable for the following components:   Chloride 97 (*)    Glucose, Bld 53 (*)    BUN 46 (*)    Creatinine, Ser 3.85 (*)    Albumin 3.4 (*)    AST 43 (*)    Alkaline Phosphatase 294 (*)    GFR calc non Af Amer 16 (*)    GFR calc Af Amer 18 (*)    All other components within normal limits  CBC - Abnormal; Notable for the following components:   RBC 3.04 (*)    Hemoglobin 9.5 (*)    HCT 29.9 (*)    RDW 16.0 (*)    All other components within normal limits  LACTIC ACID, PLASMA - Abnormal; Notable for the following components:   Lactic Acid, Venous 2.3 (*)    All other components within normal limits  GLUCOSE, CAPILLARY - Abnormal; Notable for the following components:   Glucose-Capillary <10 (*)    All other components within normal limits  GLUCOSE, CAPILLARY - Abnormal; Notable for the following components:   Glucose-Capillary 128 (*)    All other components within normal limits  SARS CORONAVIRUS 2 BY RT PCR (HOSPITAL ORDER, [facility])  CULTURE, BLOOD (ROUTINE X 2)  CULTURE, BLOOD (ROUTINE X 2)  GLUCOSE, CAPILLARY  PROCALCITONIN  LACTIC ACID, PLASMA  CBG MONITORING, ED    ____________________________________________   EKG    ____________________________________________    RADIOLOGY  CT Angio Chest PE W and/or Wo Contrast  Result Date: 12/06/2019 CLINICAL DATA:  High clinical suspicion of pulmonary embolism. End-stage renal disease on hemodialysis for 2 years. EXAM: CT ANGIOGRAPHY CHEST WITH CONTRAST TECHNIQUE: Multidetector CT imaging of the chest was performed using the standard protocol during bolus administration of intravenous contrast. Multiplanar CT image reconstructions and MIPs were obtained to evaluate the vascular anatomy. CONTRAST:  64mL OMNIPAQUE IOHEXOL 350 MG/ML SOLN COMPARISON:  Noncontrast chest CT 05/27/2019. Abdominal CT 07/17/2019. FINDINGS: Cardiovascular: The pulmonary arteries are well opacified with contrast to the level of the subsegmental branches. There is no evidence of acute pulmonary embolism. Atherosclerosis of the aorta, great vessels and coronary arteries. There is limited opacification of the systemic vessels, but no acute vascular findings are seen. The heart is mildly enlarged. There is a small pericardial effusion. Right IJ hemodialysis catheter extends to the superior cavoatrial junction. Mediastinum/Nodes: Mildly progressive mediastinal and hilar adenopathy, largest a right paratracheal node measuring 16 mm on image 21/4. No axillary adenopathy. The thyroid gland, trachea and esophagus demonstrate no significant findings. Lungs/Pleura: Moderate-sized dependent pleural effusions bilaterally with associated bibasilar atelectasis. In addition, there are diffusely increased septal markings throughout both lungs, suspicious for edema superimposed on emphysema. No confluent airspace opacity or suspicious pulmonary nodularity. Upper abdomen: Enlarging, incompletely visualized fluid collection in the left upper quadrant of the abdomen, measuring up to 8.0 x 5.2 cm on image 94/4. This lies between the stomach and spleen and has enlarged  compared with previous abdominal CT where there was a much smaller collection adjacent to a calcified pancreatic tail. Appearance suggests a pancreatic pseudocyst. Generalized soft tissue edema throughout the upper abdomen without other focal fluid collection. Musculoskeletal/Chest wall: There is no  chest wall mass or suspicious osseous finding. Generalized soft tissue edema consistent with anasarca/volume overload. Review of the MIP images confirms the above findings. IMPRESSION: 1. No evidence of acute pulmonary embolism. 2. Cardiomegaly, bilateral pleural effusions and diffuse septal thickening in both lungs most consistent with congestive heart failure/volume overload. Associated generalized soft tissue edema. 3. Mildly progressive mediastinal and hilar adenopathy, likely reactive. 4. Enlarging, incompletely visualized fluid collection in the left upper quadrant of the abdomen, suspicious for a pancreatic pseudocyst. 5. Aortic Atherosclerosis (ICD10-I70.0) and Emphysema (ICD10-J43.9). Electronically Signed   By: Richardean Sale M.D.   On: 12/06/2019 15:37   DG Chest Portable 1 View  Result Date: 12/06/2019 CLINICAL DATA:  26-year-old male with history of cough and hypoxia. EXAM: PORTABLE CHEST 1 VIEW COMPARISON:  Chest x-ray 09/16/2019. FINDINGS: Right-sided PermCath with tip terminating at the superior cavoatrial junction. There is cephalization of the pulmonary vasculature and slight indistinctness of the interstitial markings suggestive of mild pulmonary edema. Small left pleural effusion. No right pleural effusion. No pneumothorax. Heart size is mildly enlarged. Upper mediastinal contours are within normal limits. Aortic atherosclerosis. IMPRESSION: 1. The appearance of the chest suggests mild congestive heart failure, as above. 2. Aortic atherosclerosis. Electronically Signed   By: Vinnie Langton M.D.   On: 12/06/2019 13:22    ____________________________________________   PROCEDURES .Critical  Care Performed by: Carrie Mew, MD Authorized by: Carrie Mew, MD   Critical care provider statement:    Critical care time (minutes):  35   Critical care time was exclusive of:  Separately billable procedures and treating other patients   Critical care was necessary to treat or prevent imminent or life-threatening deterioration of the following conditions:  Respiratory failure, CNS failure or compromise and endocrine crisis   Critical care was time spent personally by me on the following activities:  Development of treatment plan with patient or surrogate, discussions with consultants, evaluation of patient's response to treatment, examination of patient, obtaining history from patient or surrogate, ordering and performing treatments and interventions, ordering and review of laboratory studies, ordering and review of radiographic studies, pulse oximetry, re-evaluation of patient's condition and review of old charts Comments:        Angiocath insertion  Date/Time: 12/06/2019 12:58 PM Performed by: Carrie Mew, MD Authorized by: Carrie Mew, MD  Preparation: Patient was prepped and draped in the usual sterile fashion. Local anesthesia used: no  Anesthesia: Local anesthesia used: no  Sedation: Patient sedated: no  Patient tolerance: patient tolerated the procedure well with no immediate complications Comments: L AC, attempt. Continuous [place] visualization     ____________________________________________  DIFFERENTIAL DIAGNOSIS   Pneumonia, pleural effusion, pulmonary embolism, viral illness, Covid, cystitis, electrolyte normality, dehydration  CLINICAL IMPRESSION / ASSESSMENT AND PLAN / ED COURSE  Medications ordered in the ED: Medications  cefTRIAXone (ROCEPHIN) 2 g in sodium chloride 0.9 % 100 mL IVPB (2 g Intravenous New Bag/Given 12/06/19 1544)  azithromycin (ZITHROMAX) 500 mg in sodium chloride 0.9 % 250 mL IVPB (has no administration in time range)   dextrose 50 % solution 50 mL (50 mLs Intravenous Given 12/06/19 1245)  iohexol (OMNIPAQUE) 350 MG/ML injection 75 mL (75 mLs Intravenous Contrast Given 12/06/19 1455)    Pertinent labs & imaging results that were available during my care of the patient were reviewed by me and considered in my medical decision making (see chart for details).  AIZIK REH was evaluated in Emergency Department on 12/06/2019 for the symptoms described in the history of  present illness. He was evaluated in the context of the global COVID-19 pandemic, which necessitated consideration that the patient might be at risk for infection with the SARS-CoV-2 virus that causes COVID-19. Institutional protocols and algorithms that pertain to the evaluation of patients at risk for COVID-19 are in a state of rapid change based on information released by regulatory bodies including the CDC and federal and state organizations. These policies and algorithms were followed during the patient's care in the ED.   Patient brought to ED with profound hypoglycemia.  IV started immediately upon arrival by me, IV D50 given with improvement of mental status.  Patient still appears low energy, but interacting appropriately, maintaining airway.  He is hypoxic to 79% on room air, so he will be placed on supplemental oxygen, obtain lactate blood cultures and chest x-ray.  ----------------------------------------- 3:59 PM on 12/06/2019 -----------------------------------------  Chest x-ray shows some pulmonary edema, but overall not very impressive.  CT angiogram obtained which was negative for PE, confirms pulmonary edema and signs of volume overload.  Will cover with antibiotics due to severe comorbidities, but doubt sepsis.  Case discussed with the hospitalist.      ____________________________________________   FINAL CLINICAL IMPRESSION(S) / ED DIAGNOSES    Final diagnoses:  Acute respiratory failure with hypoxia ([place])  Other  hypervolemia  ESRD on hemodialysis [facility])     ED Discharge Orders    None      Portions of this note were generated with dragon dictation software. Dictation errors may occur despite best attempts at proofreading.   Carrie Mew, MD 12/06/19 1600

## 2019-12-06 NOTE — ED Notes (Signed)
Pt given dextrose and 75mL apple juice.

## 2019-12-06 NOTE — ED Notes (Signed)
Pt able to drink 4 more mL apple juice. This RN offered peanut butter and crackers but pt states "I can't eat nothin".

## 2019-12-06 NOTE — ED Notes (Signed)
Date and time results received: 12/06/19 2:45 PM (use smartphrase ".now" to insert current time)  Test: Lactic  Critical Value: 2.3  Name of Provider Notified: Dr. Joni Fears   Orders Received? Or Actions Taken?: No new orders at this time

## 2019-12-06 NOTE — Progress Notes (Signed)
PHARMACIST - PHYSICIAN COMMUNICATION  CONCERNING:  Enoxaparin (Lovenox) for DVT Prophylaxis    RECOMMENDATION: Patient was prescribed enoxaprin 40mg  q24 hours for VTE prophylaxis.   Filed Weights   12/06/19 1241  Weight: 72.6 kg (160 lb 0.9 oz)    Body mass index is 22.32 kg/m.  Estimated Creatinine Clearance: 21 mL/min (A) (by C-G formula based on SCr of 3.85 mg/dL (H)).  Patient is candidate for enoxaparin 30mg  every 24 hours based on CrCl <28ml/min    DESCRIPTION: Pharmacy has adjusted enoxaparin dose per [facility] policy.   Patient is now receiving enoxaparin 30mg  every 24 hours.  Lu Duffel, PharmD, BCPS Clinical Pharmacist 12/06/2019 6:24 PM

## 2019-12-06 NOTE — ED Notes (Signed)
NP messaged about CBG of 21. Orders placed, will continue to monitor CBGs.

## 2019-12-06 NOTE — ED Triage Notes (Signed)
Arrives [facility] for c/o hypoglycemia.  Per report, patient was awake this morning at around 0700, took 3 units of insulin.  Patient ate oatmeal and then c/o being tired and went to lay down.  Wife went to wake patient up and was unable.  Fire Department responded and CBG 26.  EMS unable to establish IV access and glucagon given.  On arrival, CBG:  LO.  Attempted to start PIV x 1, unsuccessful.  Dr. Joni Fears attempting to start PIV with [place].  Patient responsive.  Wakes and talks.  Speech clear.

## 2019-12-07 ENCOUNTER — Inpatient Hospital Stay: Payer: Medicare Other

## 2019-12-07 ENCOUNTER — Other Ambulatory Visit: Payer: Self-pay

## 2019-12-07 ENCOUNTER — Inpatient Hospital Stay
Admit: 2019-12-07 | Discharge: 2019-12-07 | Disposition: A | Discharge status: Home / Self Care | Payer: Medicare Other | Attending: Internal Medicine | Admitting: Internal Medicine

## 2019-12-07 DIAGNOSIS — J810 Acute pulmonary edema: Secondary | ICD-10-CM

## 2019-12-07 DIAGNOSIS — L089 Local infection of the skin and subcutaneous tissue, unspecified: Secondary | ICD-10-CM

## 2019-12-07 DIAGNOSIS — E11628 Type 2 diabetes mellitus with other skin complications: Secondary | ICD-10-CM

## 2019-12-07 LAB — GLUCOSE, CAPILLARY
Glucose-Capillary: 69 mg/dL — ABNORMAL LOW (ref 70–99)
Glucose-Capillary: 105 mg/dL — ABNORMAL HIGH (ref 70–99)
Glucose-Capillary: 177 mg/dL — ABNORMAL HIGH (ref 70–99)
Glucose-Capillary: 267 mg/dL — ABNORMAL HIGH (ref 70–99)
Glucose-Capillary: 334 mg/dL — ABNORMAL HIGH (ref 70–99)
Glucose-Capillary: 363 mg/dL — ABNORMAL HIGH (ref 70–99)
Glucose-Capillary: 306 mg/dL — ABNORMAL HIGH (ref 70–99)
Glucose-Capillary: 444 mg/dL — ABNORMAL HIGH (ref 70–99)

## 2019-12-07 LAB — BASIC METABOLIC PANEL
Anion gap: 11 (ref 5–15)
BUN: 26 mg/dL — ABNORMAL HIGH (ref 6–20)
CO2: 29 mmol/L (ref 22–32)
Calcium: 7.8 mg/dL — ABNORMAL LOW (ref 8.9–10.3)
Chloride: 101 mmol/L (ref 98–111)
Creatinine, Ser: 2.62 mg/dL — ABNORMAL HIGH (ref 0.61–1.24)
GFR calc Af Amer: 29 mL/min — ABNORMAL LOW (ref >60)
GFR calc non Af Amer: 25 mL/min — ABNORMAL LOW (ref >60)
Glucose, Bld: 288 mg/dL — ABNORMAL HIGH (ref 70–99)
Potassium: 3.1 mmol/L — ABNORMAL LOW (ref 3.5–5.1)
Sodium: 141 mmol/L (ref 135–145)

## 2019-12-07 LAB — CBC
HCT: 24.8 % — ABNORMAL LOW (ref 39.0–52.0)
Hemoglobin: 7.9 g/dL — ABNORMAL LOW (ref 13.0–17.0)
MCH: 30.6 pg (ref 26.0–34.0)
MCHC: 31.9 g/dL (ref 30.0–36.0)
MCV: 96.1 fL (ref 80.0–100.0)
Platelets: 173 10*3/uL (ref 150–400)
RBC: 2.58 MIL/uL — ABNORMAL LOW (ref 4.22–5.81)
RDW: 15.9 % — ABNORMAL HIGH (ref 11.5–15.5)
WBC: 4.4 10*3/uL (ref 4.0–10.5)
nRBC: 0 % (ref 0.0–0.2)

## 2019-12-07 LAB — ECHOCARDIOGRAM COMPLETE
AR max vel: 1.92 cm2
AV Area VTI: 2.49 cm2
AV Area mean vel: 2.04 cm2
AV Mean grad: 4 mmHg
AV Peak grad: 6.8 mmHg
Ao pk vel: 1.31 m/s
Area-P 1/2: 5.34 cm2
Calc EF: 49.4 %
Height: 71 in
S' Lateral: 4.03 cm
Single Plane A2C EF: 44.7 %
Single Plane A4C EF: 53.5 %
Weight: 2022.4 oz

## 2019-12-07 LAB — HEMOGLOBIN A1C
Hgb A1c MFr Bld: 7.6 % — ABNORMAL HIGH (ref 4.8–5.6)
Mean Plasma Glucose: 171.42 mg/dL

## 2019-12-07 LAB — BRAIN NATRIURETIC PEPTIDE: B Natriuretic Peptide: >4500 pg/mL — ABNORMAL HIGH (ref 0.0–100.0)

## 2019-12-07 LAB — HEPATITIS B SURFACE ANTIGEN: Hepatitis B Surface Ag: NONREACTIVE

## 2019-12-07 LAB — PROCALCITONIN: Procalcitonin: 8.65 ng/mL

## 2019-12-07 MED ORDER — INSULIN GLARGINE 100 UNIT/ML ~~LOC~~ SOLN
5.0000 [IU] | Freq: Every day | SUBCUTANEOUS | Status: DC
  Administered 2019-12-07 – 2019-12-08 (×2): 5 [IU] via SUBCUTANEOUS
  Filled 2019-12-07 (×3): qty 0.05

## 2019-12-07 MED ORDER — HEPARIN SODIUM (PORCINE) 5000 UNIT/ML IJ SOLN
5000.0000 [IU] | Freq: Three times a day (TID) | INTRAMUSCULAR | Status: DC
  Administered 2019-12-07 – 2019-12-13 (×13): 5000 [IU] via SUBCUTANEOUS
  Filled 2019-12-07 (×13): qty 1

## 2019-12-07 MED ORDER — INSULIN ASPART 100 UNIT/ML ~~LOC~~ SOLN
0.0000 [IU] | Freq: Every day | SUBCUTANEOUS | Status: DC
  Administered 2019-12-07: 5 [IU] via SUBCUTANEOUS
  Administered 2019-12-08 – 2019-12-09 (×2): 3 [IU] via SUBCUTANEOUS
  Administered 2019-12-10: 2 [IU] via SUBCUTANEOUS
  Administered 2019-12-11: 22:00:00 5 [IU] via SUBCUTANEOUS
  Filled 2019-12-07 (×5): qty 1

## 2019-12-07 MED ORDER — ORAL CARE MOUTH RINSE
15.0000 mL | Freq: Two times a day (BID) | OROMUCOSAL | Status: DC
  Administered 2019-12-07 – 2019-12-13 (×9): 15 mL via OROMUCOSAL

## 2019-12-07 MED ORDER — CHLORHEXIDINE GLUCONATE CLOTH 2 % EX PADS
6.0000 | MEDICATED_PAD | Freq: Every day | CUTANEOUS | Status: DC
  Administered 2019-12-07 – 2019-12-09 (×3): 6 via TOPICAL

## 2019-12-07 NOTE — Progress Notes (Signed)
Inpatient Diabetes Program Recommendations  AACE/ADA: New Consensus Statement on Inpatient Glycemic Control   Target Ranges:  Prepandial:   less than 140 mg/dL      Peak postprandial:   less than 180 mg/dL (1-2 hours)      Critically ill patients:  140 - 180 mg/dL   Results for [name], [name] (MRN [id number]) as of 12/07/2019 08:44  Ref. Range 12/07/2019 01:17 12/07/2019 01:53 12/07/2019 02:52 12/07/2019 06:46 12/07/2019 08:32  Glucose-Capillary Latest Ref Range: 70 - 99 mg/dL 69 (L) 105 (H) 177 (H) 267 (H) 334 (H)  Results for [name], [name] (MRN [id number]) as of 12/07/2019 08:44  Ref. Range 12/06/2019 12:32 12/06/2019 13:52 12/06/2019 15:18 12/06/2019 16:53 12/06/2019 20:02 12/06/2019 20:21 12/06/2019 20:39 12/06/2019 21:00 12/06/2019 21:12 12/06/2019 21:45 12/06/2019 22:50  Glucose-Capillary Latest Ref Range: 70 - 99 mg/dL <10 (LL) 128 (H) 90 88 21 (LL) 91 105 (H) 116 (H) 106 (H) 106 (H) 93  Results for [name], [name] (MRN [id number]) as of 12/07/2019 08:44  Ref. Range 05/28/2019 03:00 08/24/2019 18:13  Hemoglobin A1C Latest Ref Range: 4.8 - 5.6 % 12.3 (H) 12.2 (H)   Review of Glycemic Control  Diabetes history: DM2 Outpatient Diabetes medications: Lantus 20 units daily, Humalog 0-15 units TID with meals Current orders for Inpatient glycemic control: Novolog 0-6 units TID with meals  Inpatient Diabetes Program Recommendations:   Insulin - Basal: Current glucose up to 334 mg/dl. Please consider ordering Lantus 5 units Q24H.  Outpatient DM medications: Would recommend decreasing Lantus dose outpatient and decreasing Humalog correction to very sensitive scale (0-6 units TID with meals).  NOTE: In reviewing chart, noted patient inpatient 07/06/19-07/08/19 with [facility], 08/24/19-08/25/19 with hypoglycemia, and 09/16/19-09/17/19 with HHNK (left AMA on 09/17/19). Patient presented to Emergency Room on 12/06/19 with hypoglycemia. Diabetes coordinator spoke with patient last on 09/17/19 during prior admission. Patient has wide  fluctuations in glucose control and noted to have HHNK as well as significant hypoglycemia over the past 5 months. Initial glucose on 12/06/19 <10 mg/dl and patient has been treated for hypoglycemia multiple times since arriving in Emergency Room on 12/06/19.Current glucose up to 334 mg/dl and patient was given Novolog 4 units for correction at 8:38 am. Would recommend ordering low dose basal insulin as hypoglycemia appears to be resolved. At time of discharge, MD may want to consider adjusting outpatient Lantus dose as well as Humalog scale to prevent hyperglycemia as well as hypoglycemia.   Thanks, Reginald Alderman, RN, MSN, CDE Diabetes Coordinator Inpatient Diabetes Program [phone] (Team Pager from 8am to 5pm)

## 2019-12-07 NOTE — Progress Notes (Signed)
PROGRESS NOTE    [name]  IPJ:[id number] DOB: 1960-10-18 DOA: 12/06/2019 PCP: [name], MD   Brief Narrative: [name] is a 59 y.o. male with a history of uncontrolled DM, poorly controlled HTN, ESRD on HD, previous alcohol use no use x1 year. Patient presented secondary to altered mental status and found to be hypoglycemic and severely fluid overloaded requiring emergent HD.   Assessment & Plan:   Principal Problem:   Acute respiratory failure with hypoxia (HCC) Active Problems:   Uncontrolled diabetes mellitus (HCC)   Alcohol abuse   HTN (hypertension)   CVA (cerebral vascular accident) ([place])   Chronic osteomyelitis of toe of left foot (HCC)   Diabetic foot infection ([place])   Acute pulmonary edema (HCC)   Acute respiratory failure with hypoxia Secondary to acute pulmonary edema from missed HD. Patient received emergent HD on the night of admission with improvement of symptoms and hypoxia. Patient also received lasix IV -Wean oxygen to room air as able -HD per nephrology  Acute pulmonary edema Secondary to fluid overload. Improved with HD. -Nephrology recommendations for HD  ESRD on HD -HD per nephrology  Diabetic foot infection Left second digit with purulent drainage and concern for possible osteomyelitis. Feet also with poor pulses -X-ray of bilateral feet; if negative, MRI of left foot -ABI of bilateral feet -Patient is stable so will hold off on antibiotics for now  Wheezing Given Duonebs. Resolved.  Essential hypertension Patient is on amlodipine, Coreg, Entresto, Hydralazine -Continue Coreg, Entresto, amlodipine, hydralazine  Hypoglycemia Patient with a blood sugar of 21 on admission.  Diabetes mellitus, type 2 Patient is on Humalog TID with meals, Lantus 20 units daily as an outpatient. Lantus held secondary to hypoglycemia -Continue SSI  Acute metabolic encephalopathy Secondary to hypoglycemia. Resolved.  Depression -Continue  sertraline  Malnutrition Unknown severity -Continue nutrition supplements  [name] Pancreatic pseudocyst -Outpatient follow-up  DVT prophylaxis: heparin subq Code Status:   Code Status: Full Code Family Communication: None at bedside Disposition Plan: Discharge likely home in 2-3 days pending improvement of pulmonary edema and workup for foot infection   Consultants:   Nephrology  Procedures:   Hemodialysis  Antimicrobials:  None    Subjective: No concerns today. States his left foot infection has been present for the last month and has improved. He mentions that he had the infection drained.  Objective: Vitals:   12/07/19 1130 12/07/19 1133 12/07/19 1200 12/07/19 1230  BP: 126/72  (!) 138/65 (!) 143/67  Pulse: 77  77 (!) 106  Resp: 15  21 21   Temp:      TempSrc:      SpO2: 91%  100% 99%  Weight:  57.3 kg    Height:        Intake/Output Summary (Last 24 hours) at 12/07/2019 1345 Last data filed at 12/07/2019 0259 Gross per 24 hour  Intake 615 ml  Output 2500 ml  Net -1885 ml   Filed Weights   12/06/19 1241 12/07/19 1133  Weight: 72.6 kg 57.3 kg    Examination:  General exam: Appears calm and comfortable Respiratory system: Clear to auscultation. Respiratory effort normal. Cardiovascular system: S1 & S2 heard, RRR. No murmurs, rubs, gallops or clicks. 0+ PD/DP pulses Gastrointestinal system: Abdomen is nondistended, soft and nontender. No organomegaly or masses felt. Normal bowel sounds heard. Central nervous system: Alert and oriented. No focal neurological deficits. Musculoskeletal: No edema. No calf tenderness. Left second toe with purulent drainage and dark colored  toe. Right plantar surface of foot with pinpoint lesion without surrounding erythema or mass Skin: No cyanosis. Psychiatry: Judgement and insight appear normal. Mood & affect appropriate.     Data Reviewed: I have personally reviewed following labs and imaging  studies  CBC Lab Results  Component Value Date   WBC 4.4 12/07/2019   RBC 2.58 (L) 12/07/2019   HGB 7.9 (L) 12/07/2019   HCT 24.8 (L) 12/07/2019   MCV 96.1 12/07/2019   MCH 30.6 12/07/2019   PLT 173 12/07/2019   MCHC 31.9 12/07/2019   RDW 15.9 (H) 12/07/2019   LYMPHSABS 1.2 11/25/2019   MONOABS 0.3 11/25/2019   EOSABS 0.1 11/25/2019   BASOSABS 0.0 53/66/4403     Last metabolic panel Lab Results  Component Value Date   NA 141 12/07/2019   K 3.1 (L) 12/07/2019   CL 101 12/07/2019   CO2 29 12/07/2019   BUN 26 (H) 12/07/2019   CREATININE 2.62 (H) 12/07/2019   GLUCOSE 288 (H) 12/07/2019   GFRNONAA 25 (L) 12/07/2019   GFRAA 29 (L) 12/07/2019   CALCIUM 7.8 (L) 12/07/2019   PHOS 3.4 08/25/2019   PROT 7.4 12/06/2019   ALBUMIN 3.4 (L) 12/06/2019   LABGLOB 2.7 07/26/2018   AGRATIO 0.9 07/26/2018   BILITOT 0.7 12/06/2019   ALKPHOS 294 (H) 12/06/2019   AST 43 (H) 12/06/2019   ALT 38 12/06/2019   ANIONGAP 11 12/07/2019    CBG (last 3)  Recent Labs    12/07/19 0646 12/07/19 0832 12/07/19 1128  GLUCAP 267* 334* 363*     GFR: Estimated Creatinine Clearance: 24.3 mL/min (A) (by C-G formula based on SCr of 2.62 mg/dL (H)).  Coagulation Profile: No results for input(s): INR, PROTIME in the last 168 hours.  Recent Results (from the past 240 hour(s))  Culture, blood (routine x 2)     Status: None (Preliminary result)   Collection Time: 12/06/19  1:54 PM   Specimen: BLOOD  Result Value Ref Range Status   Specimen Description BLOOD LAC  Final   Special Requests   Final    BOTTLES DRAWN AEROBIC AND ANAEROBIC Blood Culture results may not be optimal due to an excessive volume of blood received in culture bottles   Culture   Final    NO GROWTH < 12 HOURS Performed at [facility], [address], [place], [place] [address]    Report Status PENDING  Incomplete  Culture, blood (routine x 2)     Status: None (Preliminary result)   Collection Time: 12/06/19  1:54  PM   Specimen: BLOOD  Result Value Ref Range Status   Specimen Description BLOOD L BICEP  Final   Special Requests   Final    BOTTLES DRAWN AEROBIC AND ANAEROBIC Blood Culture results may not be optimal due to an excessive volume of blood received in culture bottles   Culture   Final    NO GROWTH < 12 HOURS Performed at [facility], [address] [place], [place] [address]    Report Status PENDING  Incomplete  SARS Coronavirus 2 by RT PCR (hospital order, performed in [place] hospital lab) Nasopharyngeal Nasopharyngeal Swab     Status: None   Collection Time: 12/06/19  1:54 PM   Specimen: Nasopharyngeal Swab  Result Value Ref Range Status   SARS Coronavirus 2 NEGATIVE NEGATIVE Final    Comment: (NOTE) SARS-CoV-2 target nucleic acids are NOT DETECTED.  The SARS-CoV-2 RNA is generally detectable in upper and lower respiratory  specimens during the acute phase of infection. The lowest concentration of SARS-CoV-2 viral copies this assay can detect is 250 copies / mL. A negative result does not preclude SARS-CoV-2 infection and should not be used as the sole basis for treatment or other patient management decisions.  A negative result may occur with improper specimen collection / handling, submission of specimen other than nasopharyngeal swab, presence of viral mutation(s) within the areas targeted by this assay, and inadequate number of viral copies (<250 copies / mL). A negative result must be combined with clinical observations, patient history, and epidemiological information.  Fact Sheet for Patients:   StrictlyIdeas.no  Fact Sheet for Healthcare Providers: BankingDealers.co.za  This test is not yet approved or  cleared by the [place] FDA and has been authorized for detection and/or diagnosis of SARS-CoV-2 by FDA under an Emergency Use Authorization (EUA).  This EUA will remain in effect (meaning this test can  be used) for the duration of the COVID-19 declaration under Section 564(b)(1) of the Act, 21 U.S.C. section 360bbb-3(b)(1), unless the authorization is terminated or revoked sooner.  Performed at [facility], [address], [place], [place] [address]         Radiology Studies: CT Angio Chest PE W and/or Wo Contrast  Result Date: 12/06/2019 CLINICAL DATA:  High clinical suspicion of pulmonary embolism. End-stage renal disease on hemodialysis for 2 years. EXAM: CT ANGIOGRAPHY CHEST WITH CONTRAST TECHNIQUE: Multidetector CT imaging of the chest was performed using the standard protocol during bolus administration of intravenous contrast. Multiplanar CT image reconstructions and MIPs were obtained to evaluate the vascular anatomy. CONTRAST:  59mL OMNIPAQUE IOHEXOL 350 MG/ML SOLN COMPARISON:  Noncontrast chest CT 05/27/2019. Abdominal CT 07/17/2019. FINDINGS: Cardiovascular: The pulmonary arteries are well opacified with contrast to the level of the subsegmental branches. There is no evidence of acute pulmonary embolism. Atherosclerosis of the aorta, great vessels and coronary arteries. There is limited opacification of the systemic vessels, but no acute vascular findings are seen. The heart is mildly enlarged. There is a small pericardial effusion. Right IJ hemodialysis catheter extends to the superior cavoatrial junction. Mediastinum/Nodes: Mildly progressive mediastinal and hilar adenopathy, largest a right paratracheal node measuring 16 mm on image 21/4. No axillary adenopathy. The thyroid gland, trachea and esophagus demonstrate no significant findings. Lungs/Pleura: Moderate-sized dependent pleural effusions bilaterally with associated bibasilar atelectasis. In addition, there are diffusely increased septal markings throughout both lungs, suspicious for edema superimposed on emphysema. No confluent airspace opacity or suspicious pulmonary nodularity. Upper abdomen: Enlarging,  incompletely visualized fluid collection in the left upper quadrant of the abdomen, measuring up to 8.0 x 5.2 cm on image 94/4. This lies between the stomach and spleen and has enlarged compared with previous abdominal CT where there was a much smaller collection adjacent to a calcified pancreatic tail. Appearance suggests a pancreatic pseudocyst. Generalized soft tissue edema throughout the upper abdomen without other focal fluid collection. Musculoskeletal/Chest wall: There is no chest wall mass or suspicious osseous finding. Generalized soft tissue edema consistent with anasarca/volume overload. Review of the MIP images confirms the above findings. IMPRESSION: 1. No evidence of acute pulmonary embolism. 2. Cardiomegaly, bilateral pleural effusions and diffuse septal thickening in both lungs most consistent with congestive heart failure/volume overload. Associated generalized soft tissue edema. 3. Mildly progressive mediastinal and hilar adenopathy, likely reactive. 4. Enlarging, incompletely visualized fluid collection in the left upper quadrant of the abdomen, suspicious for a pancreatic pseudocyst. 5. Aortic Atherosclerosis (ICD10-I70.0) and Emphysema (ICD10-J43.9). Electronically  Signed   By: Richardean Sale M.D.   On: 12/06/2019 15:37   DG Chest Portable 1 View  Result Date: 12/06/2019 CLINICAL DATA:  24-year-old male with history of cough and hypoxia. EXAM: PORTABLE CHEST 1 VIEW COMPARISON:  Chest x-ray 09/16/2019. FINDINGS: Right-sided PermCath with tip terminating at the superior cavoatrial junction. There is cephalization of the pulmonary vasculature and slight indistinctness of the interstitial markings suggestive of mild pulmonary edema. Small left pleural effusion. No right pleural effusion. No pneumothorax. Heart size is mildly enlarged. Upper mediastinal contours are within normal limits. Aortic atherosclerosis. IMPRESSION: 1. The appearance of the chest suggests mild congestive heart failure, as  above. 2. Aortic atherosclerosis. Electronically Signed   By: Vinnie Langton M.D.   On: 12/06/2019 13:22        Scheduled Meds: . amLODipine  5 mg Oral Q0600  . aspirin EC  81 mg Oral Daily  . carvedilol  6.25 mg Oral BID WC  . enoxaparin (LOVENOX) injection  30 mg Subcutaneous Q24H  . feeding supplement (NEPRO CARB STEADY)  237 mL Oral TID BM  . hydrALAZINE  25 mg Oral Q8H  . insulin aspart  0-6 Units Subcutaneous TID WC  . insulin glargine  5 Units Subcutaneous Daily  . melatonin  2.5 mg Oral QHS  . pantoprazole  40 mg Oral Daily  . sacubitril-valsartan  1 tablet Oral BID  . sertraline  100 mg Oral Daily  . sodium chloride flush  3 mL Intravenous Q12H   Continuous Infusions: . sodium chloride       LOS: 1 day     Cordelia Poche, MD [facility] 12/07/2019, 1:45 PM  If 7PM-7AM, please contact night-coverage [email or web address]

## 2019-12-07 NOTE — Progress Notes (Signed)
*  PRELIMINARY RESULTS* Echocardiogram 2D Echocardiogram has been performed.  Sherrie Sport 12/07/2019, 3:01 PM

## 2019-12-07 NOTE — ED Notes (Signed)
Pt cleaned of incontinent diarrhea episode by Nicki Reaper, NT.

## 2019-12-07 NOTE — ED Notes (Signed)
Pt resting quietly in room, given cup of orange juice and graham crackers. Pt reports he knows when he needs to be cleaned up and states he is dry at this time.

## 2019-12-07 NOTE — Progress Notes (Signed)
[facility], [place] 12/07/19  Subjective:   LOS: 1  Patient presented to ER for decreased responsiveness.  Found to have low blood sugar of 26. Patient cut his dialysis treatment chart on Saturday because of diarrhea.  In the ER, he was also found to be significantly hypoxic.  CT angiogram showed cardiomegaly with changes of pulmonary edema and CHF. Patient was dialyzed emergently yesterday and 2500 cc of volume was removed Today, he sitting comfortably in the gurney in the ER.  Still requiring oxygen supplementation Breathing is better.  Able to eat without nausea or vomiting  Objective:  Vital signs in last 24 hours:  Temp:  [97.8 F (36.6 C)-98 F (36.7 C)] 98 F (36.7 C) (07/26 0043) Pulse Rate:  [55-171] 78 (07/26 0800) Resp:  [7-27] 22 (07/26 0800) BP: (105-173)/(57-121) 147/70 (07/26 0800) SpO2:  [87 %-100 %] 97 % (07/26 0800) Weight:  [72.6 kg] 72.6 kg (07/25 1241)  Weight change:  Filed Weights   12/06/19 1241  Weight: 72.6 kg    Intake/Output:    Intake/Output Summary (Last 24 hours) at 12/07/2019 0839 Last data filed at 12/07/2019 0259 Gross per 24 hour  Intake 615 ml  Output 2500 ml  Net -1885 ml     Physical Exam: General:  Appears comfortable, laying in the bed  HEENT  moist oral mucous membranes  Pulm/lungs  decreased breath sounds at bases, [place] O2  CVS/Heart  no rub  Abdomen:   Soft, nontender  Extremities:  1+ pitting edema  Neurologic:  Alert, able to answer questions  Skin:  Warm, dry          Basic Metabolic Panel:  Recent Labs  Lab 12/06/19 1248 12/07/19 0639  NA 139 141  K 3.8 3.1*  CL 97* 101  CO2 30 29  GLUCOSE 53* 288*  BUN 46* 26*  CREATININE 3.85* 2.62*  CALCIUM 8.9 7.8*     CBC: Recent Labs  Lab 12/06/19 1248 12/07/19 0639  WBC 7.0 4.4  HGB 9.5* 7.9*  HCT 29.9* 24.8*  MCV 98.4 96.1  PLT 230 173      Lab Results  Component Value Date   HEPBSAG NON REACTIVE 04/11/2019   HEPBSAB  NON REACTIVE 04/10/2019      Microbiology:  Recent Results (from the past 240 hour(s))  Culture, blood (routine x 2)     Status: None (Preliminary result)   Collection Time: 12/06/19  1:54 PM   Specimen: BLOOD  Result Value Ref Range Status   Specimen Description BLOOD LAC  Final   Special Requests   Final    BOTTLES DRAWN AEROBIC AND ANAEROBIC Blood Culture results may not be optimal due to an excessive volume of blood received in culture bottles   Culture   Final    NO GROWTH < 12 HOURS Performed at [facility], [address], [place], [place] [address]    Report Status PENDING  Incomplete  Culture, blood (routine x 2)     Status: None (Preliminary result)   Collection Time: 12/06/19  1:54 PM   Specimen: BLOOD  Result Value Ref Range Status   Specimen Description BLOOD L BICEP  Final   Special Requests   Final    BOTTLES DRAWN AEROBIC AND ANAEROBIC Blood Culture results may not be optimal due to an excessive volume of blood received in culture bottles   Culture   Final    NO GROWTH < 12 HOURS Performed at [facility], [address] [place], [place] [address]    Report Status PENDING  Incomplete  SARS Coronavirus 2 by RT PCR (hospital order, performed in [facility] hospital lab) Nasopharyngeal Nasopharyngeal Swab     Status: None   Collection Time: 12/06/19  1:54 PM   Specimen: Nasopharyngeal Swab  Result Value Ref Range Status   SARS Coronavirus 2 NEGATIVE NEGATIVE Final    Comment: (NOTE) SARS-CoV-2 target nucleic acids are NOT DETECTED.  The SARS-CoV-2 RNA is generally detectable in upper and lower respiratory specimens during the acute phase of infection. The lowest concentration of SARS-CoV-2 viral copies this assay can detect is 250 copies / mL. A negative result does not preclude SARS-CoV-2 infection and should not be used as the sole basis for treatment or other patient management decisions.  A negative result may occur with improper  specimen collection / handling, submission of specimen other than nasopharyngeal swab, presence of viral mutation(s) within the areas targeted by this assay, and inadequate number of viral copies (<250 copies / mL). A negative result must be combined with clinical observations, patient history, and epidemiological information.  Fact Sheet for Patients:   StrictlyIdeas.no  Fact Sheet for Healthcare Providers: BankingDealers.co.za  This test is not yet approved or  cleared by the [place] FDA and has been authorized for detection and/or diagnosis of SARS-CoV-2 by FDA under an Emergency Use Authorization (EUA).  This EUA will remain in effect (meaning this test can be used) for the duration of the COVID-19 declaration under Section 564(b)(1) of the Act, 21 U.S.C. section 360bbb-3(b)(1), unless the authorization is terminated or revoked sooner.  Performed at [facility], [address] [place], [place] [address]     Coagulation Studies: No results for input(s): LABPROT, INR in the last 72 hours.  Urinalysis: No results for input(s): COLORURINE, LABSPEC, PHURINE, GLUCOSEU, HGBUR, BILIRUBINUR, KETONESUR, PROTEINUR, UROBILINOGEN, NITRITE, LEUKOCYTESUR in the last 72 hours.  Invalid input(s): APPERANCEUR    Imaging: CT Angio Chest PE W and/or Wo Contrast  Result Date: 12/06/2019 CLINICAL DATA:  High clinical suspicion of pulmonary embolism. End-stage renal disease on hemodialysis for 2 years. EXAM: CT ANGIOGRAPHY CHEST WITH CONTRAST TECHNIQUE: Multidetector CT imaging of the chest was performed using the standard protocol during bolus administration of intravenous contrast. Multiplanar CT image reconstructions and MIPs were obtained to evaluate the vascular anatomy. CONTRAST:  73mL OMNIPAQUE IOHEXOL 350 MG/ML SOLN COMPARISON:  Noncontrast chest CT 05/27/2019. Abdominal CT 07/17/2019. FINDINGS: Cardiovascular: The pulmonary  arteries are well opacified with contrast to the level of the subsegmental branches. There is no evidence of acute pulmonary embolism. Atherosclerosis of the aorta, great vessels and coronary arteries. There is limited opacification of the systemic vessels, but no acute vascular findings are seen. The heart is mildly enlarged. There is a small pericardial effusion. Right IJ hemodialysis catheter extends to the superior cavoatrial junction. Mediastinum/Nodes: Mildly progressive mediastinal and hilar adenopathy, largest a right paratracheal node measuring 16 mm on image 21/4. No axillary adenopathy. The thyroid gland, trachea and esophagus demonstrate no significant findings. Lungs/Pleura: Moderate-sized dependent pleural effusions bilaterally with associated bibasilar atelectasis. In addition, there are diffusely increased septal markings throughout both lungs, suspicious for edema superimposed on emphysema. No confluent airspace opacity or suspicious pulmonary nodularity. Upper abdomen: Enlarging, incompletely visualized fluid collection in the left upper quadrant of the abdomen, measuring up to 8.0 x 5.2 cm on image 94/4. This lies between the stomach and spleen and has enlarged compared with previous abdominal CT where there  was a much smaller collection adjacent to a calcified pancreatic tail. Appearance suggests a pancreatic pseudocyst. Generalized soft tissue edema throughout the upper abdomen without other focal fluid collection. Musculoskeletal/Chest wall: There is no chest wall mass or suspicious osseous finding. Generalized soft tissue edema consistent with anasarca/volume overload. Review of the MIP images confirms the above findings. IMPRESSION: 1. No evidence of acute pulmonary embolism. 2. Cardiomegaly, bilateral pleural effusions and diffuse septal thickening in both lungs most consistent with congestive heart failure/volume overload. Associated generalized soft tissue edema. 3. Mildly progressive  mediastinal and hilar adenopathy, likely reactive. 4. Enlarging, incompletely visualized fluid collection in the left upper quadrant of the abdomen, suspicious for a pancreatic pseudocyst. 5. Aortic Atherosclerosis (ICD10-I70.0) and Emphysema (ICD10-J43.9). Electronically Signed   By: Richardean Sale M.D.   On: 12/06/2019 15:37   DG Chest Portable 1 View  Result Date: 12/06/2019 CLINICAL DATA:  64-year-old male with history of cough and hypoxia. EXAM: PORTABLE CHEST 1 VIEW COMPARISON:  Chest x-ray 09/16/2019. FINDINGS: Right-sided PermCath with tip terminating at the superior cavoatrial junction. There is cephalization of the pulmonary vasculature and slight indistinctness of the interstitial markings suggestive of mild pulmonary edema. Small left pleural effusion. No right pleural effusion. No pneumothorax. Heart size is mildly enlarged. Upper mediastinal contours are within normal limits. Aortic atherosclerosis. IMPRESSION: 1. The appearance of the chest suggests mild congestive heart failure, as above. 2. Aortic atherosclerosis. Electronically Signed   By: Vinnie Langton M.D.   On: 12/06/2019 13:22     Medications:   . sodium chloride     . amLODipine  5 mg Oral Q0600  . aspirin EC  81 mg Oral Daily  . carvedilol  6.25 mg Oral BID WC  . enoxaparin (LOVENOX) injection  30 mg Subcutaneous Q24H  . feeding supplement (NEPRO CARB STEADY)  237 mL Oral TID BM  . hydrALAZINE  25 mg Oral Q8H  . insulin aspart  0-6 Units Subcutaneous TID WC  . melatonin  2.5 mg Oral QHS  . pantoprazole  40 mg Oral Daily  . sacubitril-valsartan  1 tablet Oral BID  . sertraline  100 mg Oral Daily  . sodium chloride flush  3 mL Intravenous Q12H   sodium chloride, acetaminophen, sodium chloride flush  Assessment/ Plan:  59 y.o. male with diabetes, hypertension, history of stroke with right-sided weakness, history of alcohol abuse, aortic atherosclerosis, emphysema was admitted on 12/06/2019 for  Principal  Problem:   Acute respiratory failure with hypoxia (HCC) Active Problems:   Uncontrolled diabetes mellitus (HCC)   Alcohol abuse   HTN (hypertension)   CVA (cerebral vascular accident) ([place])   Chronic osteomyelitis of toe of left foot ([place])  Acute respiratory failure with hypoxia ([place]) [J96.01]  #. ESRD with volume overload Patient underwent emergent hemodialysis Sunday night.  2500 cc of volume was removed We will plan for routine hemodialysis tomorrow (Tuesday)  #. Anemia of CKD  Lab Results  Component Value Date   HGB 7.9 (L) 12/07/2019   Low dose EPO with HD  #. Secondary hyperparathyroidism of renal origin N 25.81   No results found for: PTH Lab Results  Component Value Date   PHOS 3.4 08/25/2019   Monitor calcium and phos level during this admission   #. Diabetes type 2 with CKD Hemoglobin A1C (%)  Date Value  01/17/2014 < 3.5 (L)   Hgb A1c MFr Bld (%)  Date Value  08/24/2019 12.2 (H)  Poorly controlled Recent admission last month for hyperosmolar hyperglycemic  state.  Hypoglycemic this time.   LOS: 1 Renso Swett 7/26/20218:39 AM  [facility] [place], [place]

## 2019-12-07 NOTE — Progress Notes (Signed)
This note also relates to the following rows which could not be included: Pulse Rate - Cannot attach notes to unvalidated device data Resp - Cannot attach notes to unvalidated device data BP - Cannot attach notes to unvalidated device data SpO2 - Cannot attach notes to unvalidated device data  HD assessment.

## 2019-12-07 NOTE — ED Notes (Signed)
Pt returned from HD. HD nurse reports BP stable throghout.162/85 last BP.  HD nurse reports 2.5L fluids pulled off.  Pt resting in stretcher placed on bedpan by this RN.

## 2019-12-07 NOTE — ED Notes (Signed)
Pt assisted to toilet 

## 2019-12-07 NOTE — ED Notes (Signed)
Called for nepro drink

## 2019-12-07 NOTE — ED Notes (Signed)
Pt sitting in stretcher, pt has pleasant attitude and states feeling better after dialysis. Pt given food and is reporting being hungry again.

## 2019-12-07 NOTE — ED Notes (Signed)
Report received, care assumed.

## 2019-12-07 NOTE — Progress Notes (Signed)
This note also relates to the following rows which could not be included: Pulse Rate - Cannot attach notes to unvalidated device data SpO2 - Cannot attach notes to unvalidated device data  HD Started.

## 2019-12-08 ENCOUNTER — Inpatient Hospital Stay: Payer: Medicare Other

## 2019-12-08 DIAGNOSIS — F1010 Alcohol abuse, uncomplicated: Secondary | ICD-10-CM

## 2019-12-08 DIAGNOSIS — M86672 Other chronic osteomyelitis, left ankle and foot: Secondary | ICD-10-CM

## 2019-12-08 DIAGNOSIS — I10 Essential (primary) hypertension: Secondary | ICD-10-CM

## 2019-12-08 LAB — GLUCOSE, CAPILLARY
Glucose-Capillary: 113 mg/dL — ABNORMAL HIGH (ref 70–99)
Glucose-Capillary: 113 mg/dL — ABNORMAL HIGH (ref 70–99)
Glucose-Capillary: 199 mg/dL — ABNORMAL HIGH (ref 70–99)
Glucose-Capillary: 208 mg/dL — ABNORMAL HIGH (ref 70–99)
Glucose-Capillary: 27 mg/dL — CL (ref 70–99)
Glucose-Capillary: 277 mg/dL — ABNORMAL HIGH (ref 70–99)
Glucose-Capillary: 376 mg/dL — ABNORMAL HIGH (ref 70–99)

## 2019-12-08 LAB — PROCALCITONIN: Procalcitonin: 14.98 ng/mL

## 2019-12-08 LAB — HEPATITIS B SURFACE ANTIBODY, QUANTITATIVE: Hep B S AB Quant (Post): <3.1 m[IU]/mL — ABNORMAL LOW (ref >9.9)

## 2019-12-08 MED ORDER — EPOETIN ALFA 4000 UNIT/ML IJ SOLN
4000.0000 [IU] | INTRAMUSCULAR | Status: DC
  Administered 2019-12-10 – 2019-12-12 (×2): 4000 [IU] via INTRAVENOUS
  Filled 2019-12-08 (×2): qty 1

## 2019-12-08 MED ORDER — RENA-VITE PO TABS
1.0000 | ORAL_TABLET | Freq: Every day | ORAL | Status: DC
  Administered 2019-12-08 – 2019-12-12 (×5): 1 via ORAL
  Filled 2019-12-08 (×6): qty 1

## 2019-12-08 NOTE — Progress Notes (Addendum)
PROGRESS NOTE    [name]  AJO:[id number] DOB: 1961-03-18 DOA: 12/06/2019 PCP: [name], MD   Brief Narrative: [name] is a 59 y.o. male with a history of uncontrolled DM, poorly controlled HTN, ESRD on HD, previous alcohol use no use x1 year. Patient presented secondary to altered mental status and found to be hypoglycemic and severely fluid overloaded requiring emergent HD.   Assessment & Plan:   Principal Problem:   Acute respiratory failure with hypoxia (HCC) Active Problems:   Uncontrolled diabetes mellitus (HCC)   Alcohol abuse   HTN (hypertension)   CVA (cerebral vascular accident) ([place])   Chronic osteomyelitis of toe of left foot (HCC)   Diabetic foot infection ([place])   Acute pulmonary edema (HCC)   Acute respiratory failure with hypoxia Secondary to acute pulmonary edema from missed HD. Patient received emergent HD on the night of admission with improvement of symptoms and hypoxia. Patient also received lasix IV -Wean oxygen to room air as able -HD per nephrology  Acute pulmonary edema Secondary to fluid overload. Improved with HD. -Nephrology recommendations for HD  ESRD on HD -HD per nephrology  Diabetic foot infection Per patient, he was followed as an outpatient for management of this foot infection for the last month. He was previously on antibiotics for which he has completed. Left second digit with purulent drainage and concern for possible osteomyelitis. Feet also with poor pulses. Procalcitonin rising; no leukocytosis or fevers. Blood cultures with no growth to date. X-ray concerning for osteomyelitis -MRI of left foot -ABI of bilateral feet still pending; if arterial insufficiency, will consult Vascular Surgery prior to ortho/podiatry (patient seen at [place] office initially for infection) -Patient is stable so will hold off on antibiotics for now  Addendum: Osteomyelitis confirmed on MRI. Infectious disease and Vascular Surgery  (abnormal ABIs) consults placed 7/27 for 7/28. Pending vascular surgery recommendations, possible need for podiatry vs orthopedic surgery consult.  Wheezing Given Duonebs. Resolved.  Essential hypertension Patient is on amlodipine, Coreg, Entresto, Hydralazine -Continue Coreg, Entresto, amlodipine, hydralazine  Hypoglycemia Patient with a blood sugar of 21 on admission.  Diabetes mellitus, type 2 Patient is on Humalog TID with meals, Lantus 20 units daily as an outpatient. Lantus held secondary to hypoglycemia -Continue SSI  Acute metabolic encephalopathy Secondary to hypoglycemia. Resolved.  Depression -Continue sertraline  Malnutrition Unknown severity -Continue nutrition supplements  [name] Pancreatic pseudocyst -Outpatient follow-up  DVT prophylaxis: heparin subq Code Status:   Code Status: Full Code Family Communication: None at bedside. Wife on telephone Disposition Plan: Discharge likely home in 2-3 days pending improvement of pulmonary edema and workup for foot infection   Consultants:   Nephrology  Procedures:   Hemodialysis  Antimicrobials:  None    Subjective: No issues overnight.  Objective: Vitals:   12/08/19 1230 12/08/19 1245 12/08/19 1300 12/08/19 1349  BP: (!) 146/98 (!) 131/78 123/83   Pulse: (!) 111 (!) 130 (!) 111 82  Resp: (!) 27 (!) 30 (!) 26 18  Temp:   97.6 F (36.4 C) 98.5 F (36.9 C)  TempSrc:   Oral Oral  SpO2:   100%   Weight:      Height:        Intake/Output Summary (Last 24 hours) at 12/08/2019 1357 Last data filed at 12/08/2019 1300 Gross per 24 hour  Intake --  Output 1500 ml  Net -1500 ml   Filed Weights   12/06/19 1241 12/07/19 1133  Weight: 72.6 kg 57.3  kg    Examination:  General exam: Appears calm and comfortable Respiratory system: Diminished. Respiratory effort normal. Cardiovascular system: S1 & S2 heard, RRR. No murmurs, rubs, gallops or clicks. Right arm AV graft with palpable  thrill and audible bruit Gastrointestinal system: Abdomen is nondistended, soft and nontender. No organomegaly or masses felt. Normal bowel sounds heard. Central nervous system: Alert and oriented. No focal neurological deficits. Musculoskeletal: No edema. No calf tenderness Skin: No cyanosis. No rashes Psychiatry: Judgement and insight appear normal. Mood & affect appropriate.     Data Reviewed: I have personally reviewed following labs and imaging studies  CBC Lab Results  Component Value Date   WBC 4.4 12/07/2019   RBC 2.58 (L) 12/07/2019   HGB 7.9 (L) 12/07/2019   HCT 24.8 (L) 12/07/2019   MCV 96.1 12/07/2019   MCH 30.6 12/07/2019   PLT 173 12/07/2019   MCHC 31.9 12/07/2019   RDW 15.9 (H) 12/07/2019   LYMPHSABS 1.2 11/25/2019   MONOABS 0.3 11/25/2019   EOSABS 0.1 11/25/2019   BASOSABS 0.0 25/42/7062     Last metabolic panel Lab Results  Component Value Date   NA 141 12/07/2019   K 3.1 (L) 12/07/2019   CL 101 12/07/2019   CO2 29 12/07/2019   BUN 26 (H) 12/07/2019   CREATININE 2.62 (H) 12/07/2019   GLUCOSE 288 (H) 12/07/2019   GFRNONAA 25 (L) 12/07/2019   GFRAA 29 (L) 12/07/2019   CALCIUM 7.8 (L) 12/07/2019   PHOS 3.4 08/25/2019   PROT 7.4 12/06/2019   ALBUMIN 3.4 (L) 12/06/2019   LABGLOB 2.7 07/26/2018   AGRATIO 0.9 07/26/2018   BILITOT 0.7 12/06/2019   ALKPHOS 294 (H) 12/06/2019   AST 43 (H) 12/06/2019   ALT 38 12/06/2019   ANIONGAP 11 12/07/2019    CBG (last 3)  Recent Labs    12/08/19 0057 12/08/19 0806 12/08/19 0956  GLUCAP 199* 113* 208*     GFR: Estimated Creatinine Clearance: 24.3 mL/min (A) (by C-G formula based on SCr of 2.62 mg/dL (H)).  Coagulation Profile: No results for input(s): INR, PROTIME in the last 168 hours.  Recent Results (from the past 240 hour(s))  Culture, blood (routine x 2)     Status: None (Preliminary result)   Collection Time: 12/06/19  1:54 PM   Specimen: BLOOD  Result Value Ref Range Status   Specimen  Description BLOOD LAC  Final   Special Requests   Final    BOTTLES DRAWN AEROBIC AND ANAEROBIC Blood Culture results may not be optimal due to an excessive volume of blood received in culture bottles   Culture   Final    NO GROWTH 2 DAYS Performed at [facility], [address] [place], [place] [address]    Report Status PENDING  Incomplete  Culture, blood (routine x 2)     Status: None (Preliminary result)   Collection Time: 12/06/19  1:54 PM   Specimen: BLOOD  Result Value Ref Range Status   Specimen Description BLOOD L BICEP  Final   Special Requests   Final    BOTTLES DRAWN AEROBIC AND ANAEROBIC Blood Culture results may not be optimal due to an excessive volume of blood received in culture bottles   Culture   Final    NO GROWTH 2 DAYS Performed at [facility], [address], [place], [place] [address]    Report Status PENDING  Incomplete  SARS Coronavirus 2 by RT PCR (hospital order, performed in [facility] hospital lab)  Nasopharyngeal Nasopharyngeal Swab     Status: None   Collection Time: 12/06/19  1:54 PM   Specimen: Nasopharyngeal Swab  Result Value Ref Range Status   SARS Coronavirus 2 NEGATIVE NEGATIVE Final    Comment: (NOTE) SARS-CoV-2 target nucleic acids are NOT DETECTED.  The SARS-CoV-2 RNA is generally detectable in upper and lower respiratory specimens during the acute phase of infection. The lowest concentration of SARS-CoV-2 viral copies this assay can detect is 250 copies / mL. A negative result does not preclude SARS-CoV-2 infection and should not be used as the sole basis for treatment or other patient management decisions.  A negative result may occur with improper specimen collection / handling, submission of specimen other than nasopharyngeal swab, presence of viral mutation(s) within the areas targeted by this assay, and inadequate number of viral copies (<250 copies / mL). A negative result must be combined with  clinical observations, patient history, and epidemiological information.  Fact Sheet for Patients:   StrictlyIdeas.no  Fact Sheet for Healthcare Providers: BankingDealers.co.za  This test is not yet approved or  cleared by the [place] FDA and has been authorized for detection and/or diagnosis of SARS-CoV-2 by FDA under an Emergency Use Authorization (EUA).  This EUA will remain in effect (meaning this test can be used) for the duration of the COVID-19 declaration under Section 564(b)(1) of the Act, 21 U.S.C. section 360bbb-3(b)(1), unless the authorization is terminated or revoked sooner.  Performed at [facility], [address] [place], [place] [address]         Radiology Studies: CT Angio Chest PE W and/or Wo Contrast  Result Date: 12/06/2019 CLINICAL DATA:  High clinical suspicion of pulmonary embolism. End-stage renal disease on hemodialysis for 2 years. EXAM: CT ANGIOGRAPHY CHEST WITH CONTRAST TECHNIQUE: Multidetector CT imaging of the chest was performed using the standard protocol during bolus administration of intravenous contrast. Multiplanar CT image reconstructions and MIPs were obtained to evaluate the vascular anatomy. CONTRAST:  53mL OMNIPAQUE IOHEXOL 350 MG/ML SOLN COMPARISON:  Noncontrast chest CT 05/27/2019. Abdominal CT 07/17/2019. FINDINGS: Cardiovascular: The pulmonary arteries are well opacified with contrast to the level of the subsegmental branches. There is no evidence of acute pulmonary embolism. Atherosclerosis of the aorta, great vessels and coronary arteries. There is limited opacification of the systemic vessels, but no acute vascular findings are seen. The heart is mildly enlarged. There is a small pericardial effusion. Right IJ hemodialysis catheter extends to the superior cavoatrial junction. Mediastinum/Nodes: Mildly progressive mediastinal and hilar adenopathy, largest a right paratracheal  node measuring 16 mm on image 21/4. No axillary adenopathy. The thyroid gland, trachea and esophagus demonstrate no significant findings. Lungs/Pleura: Moderate-sized dependent pleural effusions bilaterally with associated bibasilar atelectasis. In addition, there are diffusely increased septal markings throughout both lungs, suspicious for edema superimposed on emphysema. No confluent airspace opacity or suspicious pulmonary nodularity. Upper abdomen: Enlarging, incompletely visualized fluid collection in the left upper quadrant of the abdomen, measuring up to 8.0 x 5.2 cm on image 94/4. This lies between the stomach and spleen and has enlarged compared with previous abdominal CT where there was a much smaller collection adjacent to a calcified pancreatic tail. Appearance suggests a pancreatic pseudocyst. Generalized soft tissue edema throughout the upper abdomen without other focal fluid collection. Musculoskeletal/Chest wall: There is no chest wall mass or suspicious osseous finding. Generalized soft tissue edema consistent with anasarca/volume overload. Review of the MIP images confirms the above findings. IMPRESSION: 1. No evidence of acute pulmonary embolism.  2. Cardiomegaly, bilateral pleural effusions and diffuse septal thickening in both lungs most consistent with congestive heart failure/volume overload. Associated generalized soft tissue edema. 3. Mildly progressive mediastinal and hilar adenopathy, likely reactive. 4. Enlarging, incompletely visualized fluid collection in the left upper quadrant of the abdomen, suspicious for a pancreatic pseudocyst. 5. Aortic Atherosclerosis (ICD10-I70.0) and Emphysema (ICD10-J43.9). Electronically Signed   By: Richardean Sale M.D.   On: 12/06/2019 15:37   US ARTERIAL ABI (SCREENING LOWER EXTREMITY)  Result Date: 12/08/2019 CLINICAL DATA:  Left second toe infection. Diabetes, hypertension, hyperlipidemia, renal failure, history of tobacco abuse EXAM: NONINVASIVE  PHYSIOLOGIC VASCULAR STUDY OF BILATERAL LOWER EXTREMITIES TECHNIQUE: Evaluation of both lower extremities were performed at rest, including calculation of ankle-brachial indices with single level Doppler, pressure and pulse volume recording. COMPARISON:  None available FINDINGS: Right ABI: Non calculable due to vascular noncompressibility. Toe brachial index 0.5 Left ABI: Non calculable due to vascular noncompressibility. Toe brachial index 0.45 Right Lower Extremity:  Biphasic distal arterial waveforms Left Lower Extremity:  Biphasic distal arterial waveforms IMPRESSION: ABIs are non diagnostic secondary to incompressible vessel calcifications (medial arterial sclerosis of [place]). Decreased toe brachial indices consistent with at least moderate bilateral arterial occlusive disease at rest. Electronically Signed   By: Lucrezia Europe M.D.   On: 12/08/2019 13:13   DG Foot Complete Left  Result Date: 12/07/2019 CLINICAL DATA:  59 year old male with diabetic foot infection. EXAM: LEFT FOOT - COMPLETE 3+ VIEW COMPARISON:  Left ankle radiograph dated 11/09/2019. FINDINGS: There is no acute fracture or dislocation. The bones are osteopenic. Erosive changes of the middle and distal phalanx of the second toe, progressed since the prior radiograph and concerning for osteomyelitis. Clinical correlation is recommended. There are erosive changes versus osteotomy of the third and fourth MTP joints similar to prior radiograph. There is soft tissue swelling and small pockets of soft tissue air in the distal second toe in keeping with ulcer. Vascular calcifications noted. IMPRESSION: 1. No acute fracture or dislocation. 2. Ulceration of the distal aspect of the second toe with findings concerning for osteomyelitis of the middle and distal phalanges of the second toe. MRI or a WBC nuclear scan may provide better evaluation. Electronically Signed   By: Anner Crete M.D.   On: 12/07/2019 19:05   DG Foot Complete  Right  Result Date: 12/07/2019 CLINICAL DATA:  Bilateral diabetic foot infection. Bilateral foot abscess. EXAM: RIGHT FOOT COMPLETE - 3+ VIEW COMPARISON:  Remote right foot exam 03/01/2018 FINDINGS: No bony destruction, periosteal reaction, or abnormal bone density to suggest osteomyelitis. No visualized large soft tissue defects or soft tissue air. Osteoarthritis of the first metatarsal phalangeal joint again seen, slight progression from 2019. slight hammertoe deformity of the fourth digit. Alignment otherwise maintained. There are vascular calcifications. IMPRESSION: 1. No radiographic evidence of osteomyelitis. No visualized large soft tissue defects/ulcer or soft tissue air. 2. Osteoarthritis of the first metatarsophalangeal joint, slightly progressed from 2019. 3. Vascular calcifications. Electronically Signed   By: Keith Rake M.D.   On: 12/07/2019 19:01   ECHOCARDIOGRAM COMPLETE  Result Date: 12/07/2019    ECHOCARDIOGRAM REPORT   Patient Name:   [name] Date of Exam: 12/07/2019 Medical Rec #:  [id number]     Height:       71.0 in Accession #:    [id number]    Weight:       126.4 lb Date of Birth:  11-Mar-1961     BSA:  1.735 m Patient Age:    59 years      BP:           136/75 mmHg Patient Gender: M             HR:           85 bpm. Exam Location:  [facility] Procedure: 2D Echo, Color Doppler and Cardiac Doppler Indications:     CHF 428.0  History:         Patient has no prior history of Echocardiogram examinations.                  Stroke; Risk Factors:Hypertension, Diabetes and Dyslipidemia.                  CKD, ETOH abuse.  Sonographer:     Sherrie Sport RDCS (AE) Referring Phys:  [id number] [place] Diagnosing Phys: Isaias Cowman MD IMPRESSIONS  1. Left ventricular ejection fraction, by estimation, is 45 to 50%. The left ventricle has mildly decreased function. The left ventricle has no regional wall motion abnormalities. Left ventricular diastolic parameters were normal.   2. Right ventricular systolic function is normal. The right ventricular size is normal. There is severely elevated pulmonary artery systolic pressure.  3. Left atrial size was moderately dilated.  4. The mitral valve is normal in structure. Moderate to severe mitral valve regurgitation. No evidence of mitral stenosis.  5. Tricuspid valve regurgitation is moderate.  6. The aortic valve is normal in structure. Aortic valve regurgitation is mild. No aortic stenosis is present.  7. The inferior vena cava is normal in size with greater than 50% respiratory variability, suggesting right atrial pressure of 3 mmHg. FINDINGS  Left Ventricle: Left ventricular ejection fraction, by estimation, is 45 to 50%. The left ventricle has mildly decreased function. The left ventricle has no regional wall motion abnormalities. The left ventricular internal cavity size was normal in size. There is no left ventricular hypertrophy. Left ventricular diastolic parameters were normal. Right Ventricle: The right ventricular size is normal. No increase in right ventricular wall thickness. Right ventricular systolic function is normal. There is severely elevated pulmonary artery systolic pressure. The tricuspid regurgitant velocity is 3.56 m/s, and with an assumed right atrial pressure of 10 mmHg, the estimated right ventricular systolic pressure is 81.8 mmHg. Left Atrium: Left atrial size was moderately dilated. Right Atrium: Right atrial size was normal in size. Pericardium: There is no evidence of pericardial effusion. Mitral Valve: The mitral valve is normal in structure. Normal mobility of the mitral valve leaflets. Moderate to severe mitral valve regurgitation. No evidence of mitral valve stenosis. Tricuspid Valve: The tricuspid valve is normal in structure. Tricuspid valve regurgitation is moderate . No evidence of tricuspid stenosis. Aortic Valve: The aortic valve is normal in structure. Aortic valve regurgitation is mild. No aortic  stenosis is present. Aortic valve mean gradient measures 4.0 mmHg. Aortic valve peak gradient measures 6.8 mmHg. Aortic valve area, by VTI measures 2.49 cm. Pulmonic Valve: The pulmonic valve was normal in structure. Pulmonic valve regurgitation is not visualized. No evidence of pulmonic stenosis. Aorta: The aortic root is normal in size and structure. Venous: The inferior vena cava is normal in size with greater than 50% respiratory variability, suggesting right atrial pressure of 3 mmHg. IAS/Shunts: No atrial level shunt detected by color flow Doppler.  LEFT VENTRICLE PLAX 2D LVIDd:         5.30 cm      Diastology LVIDs:  4.03 cm      LV e' lateral:   10.10 cm/s LV PW:         1.13 cm      LV E/e' lateral: 10.1 LV IVS:        1.07 cm      LV e' medial:    6.74 cm/s LVOT diam:     2.10 cm      LV E/e' medial:  15.1 LV SV:         58 LV SV Index:   33 LVOT Area:     3.46 cm  LV Volumes (MOD) LV vol d, MOD A2C: 111.0 ml LV vol d, MOD A4C: 129.0 ml LV vol s, MOD A2C: 61.4 ml LV vol s, MOD A4C: 60.0 ml LV SV MOD A2C:     49.6 ml LV SV MOD A4C:     129.0 ml LV SV MOD BP:      59.1 ml RIGHT VENTRICLE RV Basal diam:  3.39 cm RV S prime:     11.30 cm/s TAPSE (M-mode): 4.5 cm LEFT ATRIUM            Index       RIGHT ATRIUM           Index LA diam:      4.50 cm  2.59 cm/m  RA Area:     15.20 cm LA Vol (A2C): 130.0 ml 74.93 ml/m RA Volume:   34.90 ml  20.12 ml/m LA Vol (A4C): 38.6 ml  22.25 ml/m  AORTIC VALVE                   PULMONIC VALVE AV Area (Vmax):    1.92 cm    PV Vmax:        0.47 m/s AV Area (Vmean):   2.04 cm    PV Peak grad:   0.9 mmHg AV Area (VTI):     2.49 cm    RVOT Peak grad: 3 mmHg AV Vmax:           130.50 cm/s AV Vmean:          92.850 cm/s AV VTI:            0.232 m AV Peak Grad:      6.8 mmHg AV Mean Grad:      4.0 mmHg LVOT Vmax:         72.50 cm/s LVOT Vmean:        54.800 cm/s LVOT VTI:          0.167 m LVOT/AV VTI ratio: 0.72  AORTA Ao Root diam: 2.60 cm MITRAL VALVE                 TRICUSPID VALVE MV Area (PHT): 5.34 cm     TR Peak grad:   50.7 mmHg MV Decel Time: 142 msec     TR Vmax:        356.00 cm/s MV E velocity: 102.00 cm/s MV A velocity: 41.10 cm/s   SHUNTS MV E/A ratio:  2.48         Systemic VTI:  0.17 m                             Systemic Diam: 2.10 cm Isaias Cowman MD Electronically signed by Isaias Cowman MD Signature Date/Time: 12/07/2019/4:48:11 PM    Final         Scheduled Meds: . amLODipine  5 mg Oral Q0600  . aspirin EC  81 mg Oral Daily  . carvedilol  6.25 mg Oral BID WC  . Chlorhexidine Gluconate Cloth  6 each Topical Daily  . feeding supplement (NEPRO CARB STEADY)  237 mL Oral TID BM  . heparin  5,000 Units Subcutaneous Q8H  . hydrALAZINE  25 mg Oral Q8H  . insulin aspart  0-5 Units Subcutaneous QHS  . insulin aspart  0-6 Units Subcutaneous TID WC  . insulin glargine  5 Units Subcutaneous Daily  . mouth rinse  15 mL Mouth Rinse BID  . melatonin  2.5 mg Oral QHS  . multivitamin  1 tablet Oral QHS  . pantoprazole  40 mg Oral Daily  . sacubitril-valsartan  1 tablet Oral BID  . sertraline  100 mg Oral Daily  . sodium chloride flush  3 mL Intravenous Q12H   Continuous Infusions: . sodium chloride       LOS: 2 days     Cordelia Poche, MD [facility] 12/08/2019, 1:57 PM  If 7PM-7AM, please contact night-coverage [email or web address]

## 2019-12-08 NOTE — Progress Notes (Signed)
[facility], [place] 12/08/19  Subjective:   LOS: 2  Patient initially presented to ER for decreased responsiveness.  Found to have low blood sugar of 26. Patient cut his dialysis treatment chart on Saturday because of diarrhea.  In the ER, he was also found to be significantly hypoxic.  CT angiogram showed cardiomegaly with changes of pulmonary edema and CHF. Patient was dialyzed emergently yesterday and 2500 cc of volume was removed    HEMODIALYSIS FLOWSHEET:  Blood Flow Rate (mL/min): 200 mL/min Arterial Pressure (mmHg): -30 mmHg Venous Pressure (mmHg): 80 mmHg Transmembrane Pressure (mmHg): 60 mmHg Ultrafiltration Rate (mL/min): 300 mL/min Dialysate Flow Rate (mL/min): 600 ml/min Conductivity: Machine : 14.1 Conductivity: Machine : 14.1 Dialysis Fluid Bolus: Normal Saline Bolus Amount (mL): 250 mL    Objective:  Vital signs in last 24 hours:  Temp:  [97.6 F (36.4 C)-98.5 F (36.9 C)] 98.5 F (36.9 C) (07/27 1349) Pulse Rate:  [65-130] 82 (07/27 1349) Resp:  [15-30] 18 (07/27 1349) BP: (116-147)/(67-100) 123/83 (07/27 1300) SpO2:  [82 %-100 %] 100 % (07/27 1300)  Weight change: -15.3 kg Filed Weights   12/06/19 1241 12/07/19 1133  Weight: 72.6 kg 57.3 kg    Intake/Output:    Intake/Output Summary (Last 24 hours) at 12/08/2019 1443 Last data filed at 12/08/2019 1300 Gross per 24 hour  Intake --  Output 1500 ml  Net -1500 ml     Physical Exam: General:  Appears comfortable, laying in the bed  HEENT  moist oral mucous membranes  Pulm/lungs  decreased breath sounds at bases,  CVS/Heart  no rub  Abdomen:   Soft, nontender  Extremities:  1+ pitting edema  Neurologic:  Alert, able to answer questions  Skin:  Warm, dry   Right IJ PermCath   Right arm AV graft    Basic Metabolic Panel:  Recent Labs  Lab 12/06/19 1248 12/07/19 0639  NA 139 141  K 3.8 3.1*  CL 97* 101  CO2 30 29  GLUCOSE 53* 288*  BUN 46* 26*   CREATININE 3.85* 2.62*  CALCIUM 8.9 7.8*     CBC: Recent Labs  Lab 12/06/19 1248 12/07/19 0639  WBC 7.0 4.4  HGB 9.5* 7.9*  HCT 29.9* 24.8*  MCV 98.4 96.1  PLT 230 173      Lab Results  Component Value Date   HEPBSAG NON REACTIVE 12/06/2019   HEPBSAB NON REACTIVE 04/10/2019      Microbiology:  Recent Results (from the past 240 hour(s))  Culture, blood (routine x 2)     Status: None (Preliminary result)   Collection Time: 12/06/19  1:54 PM   Specimen: BLOOD  Result Value Ref Range Status   Specimen Description BLOOD LAC  Final   Special Requests   Final    BOTTLES DRAWN AEROBIC AND ANAEROBIC Blood Culture results may not be optimal due to an excessive volume of blood received in culture bottles   Culture   Final    NO GROWTH 2 DAYS Performed at [facility], [address] [place], [place] [address]    Report Status PENDING  Incomplete  Culture, blood (routine x 2)     Status: None (Preliminary result)   Collection Time: 12/06/19  1:54 PM   Specimen: BLOOD  Result Value Ref Range Status   Specimen Description BLOOD L BICEP  Final   Special Requests   Final    BOTTLES DRAWN AEROBIC AND ANAEROBIC Blood Culture results may not be optimal  due to an excessive volume of blood received in culture bottles   Culture   Final    NO GROWTH 2 DAYS Performed at [facility], [address] [place], [place] [address]    Report Status PENDING  Incomplete  SARS Coronavirus 2 by RT PCR (hospital order, performed in [facility] hospital lab) Nasopharyngeal Nasopharyngeal Swab     Status: None   Collection Time: 12/06/19  1:54 PM   Specimen: Nasopharyngeal Swab  Result Value Ref Range Status   SARS Coronavirus 2 NEGATIVE NEGATIVE Final    Comment: (NOTE) SARS-CoV-2 target nucleic acids are NOT DETECTED.  The SARS-CoV-2 RNA is generally detectable in upper and lower respiratory specimens during the acute phase of infection. The lowest concentration of  SARS-CoV-2 viral copies this assay can detect is 250 copies / mL. A negative result does not preclude SARS-CoV-2 infection and should not be used as the sole basis for treatment or other patient management decisions.  A negative result may occur with improper specimen collection / handling, submission of specimen other than nasopharyngeal swab, presence of viral mutation(s) within the areas targeted by this assay, and inadequate number of viral copies (<250 copies / mL). A negative result must be combined with clinical observations, patient history, and epidemiological information.  Fact Sheet for Patients:   StrictlyIdeas.no  Fact Sheet for Healthcare Providers: BankingDealers.co.za  This test is not yet approved or  cleared by the [place] FDA and has been authorized for detection and/or diagnosis of SARS-CoV-2 by FDA under an Emergency Use Authorization (EUA).  This EUA will remain in effect (meaning this test can be used) for the duration of the COVID-19 declaration under Section 564(b)(1) of the Act, 21 U.S.C. section 360bbb-3(b)(1), unless the authorization is terminated or revoked sooner.  Performed at [facility], [address] [place], [place] [address]     Coagulation Studies: No results for input(s): LABPROT, INR in the last 72 hours.  Urinalysis: No results for input(s): COLORURINE, LABSPEC, PHURINE, GLUCOSEU, HGBUR, BILIRUBINUR, KETONESUR, PROTEINUR, UROBILINOGEN, NITRITE, LEUKOCYTESUR in the last 72 hours.  Invalid input(s): APPERANCEUR    Imaging: CT Angio Chest PE W and/or Wo Contrast  Result Date: 12/06/2019 CLINICAL DATA:  High clinical suspicion of pulmonary embolism. End-stage renal disease on hemodialysis for 2 years. EXAM: CT ANGIOGRAPHY CHEST WITH CONTRAST TECHNIQUE: Multidetector CT imaging of the chest was performed using the standard protocol during bolus administration of intravenous  contrast. Multiplanar CT image reconstructions and MIPs were obtained to evaluate the vascular anatomy. CONTRAST:  17mL OMNIPAQUE IOHEXOL 350 MG/ML SOLN COMPARISON:  Noncontrast chest CT 05/27/2019. Abdominal CT 07/17/2019. FINDINGS: Cardiovascular: The pulmonary arteries are well opacified with contrast to the level of the subsegmental branches. There is no evidence of acute pulmonary embolism. Atherosclerosis of the aorta, great vessels and coronary arteries. There is limited opacification of the systemic vessels, but no acute vascular findings are seen. The heart is mildly enlarged. There is a small pericardial effusion. Right IJ hemodialysis catheter extends to the superior cavoatrial junction. Mediastinum/Nodes: Mildly progressive mediastinal and hilar adenopathy, largest a right paratracheal node measuring 16 mm on image 21/4. No axillary adenopathy. The thyroid gland, trachea and esophagus demonstrate no significant findings. Lungs/Pleura: Moderate-sized dependent pleural effusions bilaterally with associated bibasilar atelectasis. In addition, there are diffusely increased septal markings throughout both lungs, suspicious for edema superimposed on emphysema. No confluent airspace opacity or suspicious pulmonary nodularity. Upper abdomen: Enlarging, incompletely visualized fluid collection in the left upper quadrant  of the abdomen, measuring up to 8.0 x 5.2 cm on image 94/4. This lies between the stomach and spleen and has enlarged compared with previous abdominal CT where there was a much smaller collection adjacent to a calcified pancreatic tail. Appearance suggests a pancreatic pseudocyst. Generalized soft tissue edema throughout the upper abdomen without other focal fluid collection. Musculoskeletal/Chest wall: There is no chest wall mass or suspicious osseous finding. Generalized soft tissue edema consistent with anasarca/volume overload. Review of the MIP images confirms the above findings. IMPRESSION:  1. No evidence of acute pulmonary embolism. 2. Cardiomegaly, bilateral pleural effusions and diffuse septal thickening in both lungs most consistent with congestive heart failure/volume overload. Associated generalized soft tissue edema. 3. Mildly progressive mediastinal and hilar adenopathy, likely reactive. 4. Enlarging, incompletely visualized fluid collection in the left upper quadrant of the abdomen, suspicious for a pancreatic pseudocyst. 5. Aortic Atherosclerosis (ICD10-I70.0) and Emphysema (ICD10-J43.9). Electronically Signed   By: Richardean Sale M.D.   On: 12/06/2019 15:37   US ARTERIAL ABI (SCREENING LOWER EXTREMITY)  Result Date: 12/08/2019 CLINICAL DATA:  Left second toe infection. Diabetes, hypertension, hyperlipidemia, renal failure, history of tobacco abuse EXAM: NONINVASIVE PHYSIOLOGIC VASCULAR STUDY OF BILATERAL LOWER EXTREMITIES TECHNIQUE: Evaluation of both lower extremities were performed at rest, including calculation of ankle-brachial indices with single level Doppler, pressure and pulse volume recording. COMPARISON:  None available FINDINGS: Right ABI: Non calculable due to vascular noncompressibility. Toe brachial index 0.5 Left ABI: Non calculable due to vascular noncompressibility. Toe brachial index 0.45 Right Lower Extremity:  Biphasic distal arterial waveforms Left Lower Extremity:  Biphasic distal arterial waveforms IMPRESSION: ABIs are non diagnostic secondary to incompressible vessel calcifications (medial arterial sclerosis of [place]cke[place]). Decreased toe brachial indices consistent with at least moderate bilateral arterial occlusive disease at rest. Electronically Signed   By: Lucrezia Europe M.D.   On: 12/08/2019 13:13   DG Foot Complete Left  Result Date: 12/07/2019 CLINICAL DATA:  59 year old male with diabetic foot infection. EXAM: LEFT FOOT - COMPLETE 3+ VIEW COMPARISON:  Left ankle radiograph dated 11/09/2019. FINDINGS: There is no acute fracture or dislocation. The bones  are osteopenic. Erosive changes of the middle and distal phalanx of the second toe, progressed since the prior radiograph and concerning for osteomyelitis. Clinical correlation is recommended. There are erosive changes versus osteotomy of the third and fourth MTP joints similar to prior radiograph. There is soft tissue swelling and small pockets of soft tissue air in the distal second toe in keeping with ulcer. Vascular calcifications noted. IMPRESSION: 1. No acute fracture or dislocation. 2. Ulceration of the distal aspect of the second toe with findings concerning for osteomyelitis of the middle and distal phalanges of the second toe. MRI or a WBC nuclear scan may provide better evaluation. Electronically Signed   By: Anner Crete M.D.   On: 12/07/2019 19:05   DG Foot Complete Right  Result Date: 12/07/2019 CLINICAL DATA:  Bilateral diabetic foot infection. Bilateral foot abscess. EXAM: RIGHT FOOT COMPLETE - 3+ VIEW COMPARISON:  Remote right foot exam 03/01/2018 FINDINGS: No bony destruction, periosteal reaction, or abnormal bone density to suggest osteomyelitis. No visualized large soft tissue defects or soft tissue air. Osteoarthritis of the first metatarsal phalangeal joint again seen, slight progression from 2019. slight hammertoe deformity of the fourth digit. Alignment otherwise maintained. There are vascular calcifications. IMPRESSION: 1. No radiographic evidence of osteomyelitis. No visualized large soft tissue defects/ulcer or soft tissue air. 2. Osteoarthritis of the first metatarsophalangeal joint, slightly progressed from 2019.  3. Vascular calcifications. Electronically Signed   By: Keith Rake M.D.   On: 12/07/2019 19:01   ECHOCARDIOGRAM COMPLETE  Result Date: 12/07/2019    ECHOCARDIOGRAM REPORT   Patient Name:   [name] Date of Exam: 12/07/2019 Medical Rec #:  [id number]     Height:       71.0 in Accession #:    [id number]    Weight:       126.4 lb Date of Birth:  08-17-1960      BSA:          1.735 m Patient Age:    59 years      BP:           136/75 mmHg Patient Gender: M             HR:           85 bpm. Exam Location:  [facility] Procedure: 2D Echo, Color Doppler and Cardiac Doppler Indications:     CHF 428.0  History:         Patient has no prior history of Echocardiogram examinations.                  Stroke; Risk Factors:Hypertension, Diabetes and Dyslipidemia.                  CKD, ETOH abuse.  Sonographer:     Sherrie Sport RDCS (AE) Referring Phys:  [id number] Joes Diagnosing Phys: Isaias Cowman MD IMPRESSIONS  1. Left ventricular ejection fraction, by estimation, is 45 to 50%. The left ventricle has mildly decreased function. The left ventricle has no regional wall motion abnormalities. Left ventricular diastolic parameters were normal.  2. Right ventricular systolic function is normal. The right ventricular size is normal. There is severely elevated pulmonary artery systolic pressure.  3. Left atrial size was moderately dilated.  4. The mitral valve is normal in structure. Moderate to severe mitral valve regurgitation. No evidence of mitral stenosis.  5. Tricuspid valve regurgitation is moderate.  6. The aortic valve is normal in structure. Aortic valve regurgitation is mild. No aortic stenosis is present.  7. The inferior vena cava is normal in size with greater than 50% respiratory variability, suggesting right atrial pressure of 3 mmHg. FINDINGS  Left Ventricle: Left ventricular ejection fraction, by estimation, is 45 to 50%. The left ventricle has mildly decreased function. The left ventricle has no regional wall motion abnormalities. The left ventricular internal cavity size was normal in size. There is no left ventricular hypertrophy. Left ventricular diastolic parameters were normal. Right Ventricle: The right ventricular size is normal. No increase in right ventricular wall thickness. Right ventricular systolic function is normal. There is severely elevated  pulmonary artery systolic pressure. The tricuspid regurgitant velocity is 3.56 m/s, and with an assumed right atrial pressure of 10 mmHg, the estimated right ventricular systolic pressure is 94.8 mmHg. Left Atrium: Left atrial size was moderately dilated. Right Atrium: Right atrial size was normal in size. Pericardium: There is no evidence of pericardial effusion. Mitral Valve: The mitral valve is normal in structure. Normal mobility of the mitral valve leaflets. Moderate to severe mitral valve regurgitation. No evidence of mitral valve stenosis. Tricuspid Valve: The tricuspid valve is normal in structure. Tricuspid valve regurgitation is moderate . No evidence of tricuspid stenosis. Aortic Valve: The aortic valve is normal in structure. Aortic valve regurgitation is mild. No aortic stenosis is present. Aortic valve mean gradient measures 4.0 mmHg. Aortic valve peak  gradient measures 6.8 mmHg. Aortic valve area, by VTI measures 2.49 cm. Pulmonic Valve: The pulmonic valve was normal in structure. Pulmonic valve regurgitation is not visualized. No evidence of pulmonic stenosis. Aorta: The aortic root is normal in size and structure. Venous: The inferior vena cava is normal in size with greater than 50% respiratory variability, suggesting right atrial pressure of 3 mmHg. IAS/Shunts: No atrial level shunt detected by color flow Doppler.  LEFT VENTRICLE PLAX 2D LVIDd:         5.30 cm      Diastology LVIDs:         4.03 cm      LV e' lateral:   10.10 cm/s LV PW:         1.13 cm      LV E/e' lateral: 10.1 LV IVS:        1.07 cm      LV e' medial:    6.74 cm/s LVOT diam:     2.10 cm      LV E/e' medial:  15.1 LV SV:         58 LV SV Index:   33 LVOT Area:     3.46 cm  LV Volumes (MOD) LV vol d, MOD A2C: 111.0 ml LV vol d, MOD A4C: 129.0 ml LV vol s, MOD A2C: 61.4 ml LV vol s, MOD A4C: 60.0 ml LV SV MOD A2C:     49.6 ml LV SV MOD A4C:     129.0 ml LV SV MOD BP:      59.1 ml RIGHT VENTRICLE RV Basal diam:  3.39 cm RV S  prime:     11.30 cm/s TAPSE (M-mode): 4.5 cm LEFT ATRIUM            Index       RIGHT ATRIUM           Index LA diam:      4.50 cm  2.59 cm/m  RA Area:     15.20 cm LA Vol (A2C): 130.0 ml 74.93 ml/m RA Volume:   34.90 ml  20.12 ml/m LA Vol (A4C): 38.6 ml  22.25 ml/m  AORTIC VALVE                   PULMONIC VALVE AV Area (Vmax):    1.92 cm    PV Vmax:        0.47 m/s AV Area (Vmean):   2.04 cm    PV Peak grad:   0.9 mmHg AV Area (VTI):     2.49 cm    RVOT Peak grad: 3 mmHg AV Vmax:           130.50 cm/s AV Vmean:          92.850 cm/s AV VTI:            0.232 m AV Peak Grad:      6.8 mmHg AV Mean Grad:      4.0 mmHg LVOT Vmax:         72.50 cm/s LVOT Vmean:        54.800 cm/s LVOT VTI:          0.167 m LVOT/AV VTI ratio: 0.72  AORTA Ao Root diam: 2.60 cm MITRAL VALVE                TRICUSPID VALVE MV Area (PHT): 5.34 cm     TR Peak grad:   50.7 mmHg MV Decel Time: 142 msec  TR Vmax:        356.00 cm/s MV E velocity: 102.00 cm/s MV A velocity: 41.10 cm/s   SHUNTS MV E/A ratio:  2.48         Systemic VTI:  0.17 m                             Systemic Diam: 2.10 cm Isaias Cowman MD Electronically signed by Isaias Cowman MD Signature Date/Time: 12/07/2019/4:48:11 PM    Final      Medications:    sodium chloride      amLODipine  5 mg Oral Q0600   aspirin EC  81 mg Oral Daily   carvedilol  6.25 mg Oral BID WC   Chlorhexidine Gluconate Cloth  6 each Topical Daily   feeding supplement (NEPRO CARB STEADY)  237 mL Oral TID BM   heparin  5,000 Units Subcutaneous Q8H   hydrALAZINE  25 mg Oral Q8H   insulin aspart  0-5 Units Subcutaneous QHS   insulin aspart  0-6 Units Subcutaneous TID WC   insulin glargine  5 Units Subcutaneous Daily   mouth rinse  15 mL Mouth Rinse BID   melatonin  2.5 mg Oral QHS   multivitamin  1 tablet Oral QHS   pantoprazole  40 mg Oral Daily   sacubitril-valsartan  1 tablet Oral BID   sertraline  100 mg Oral Daily   sodium chloride flush  3 mL  Intravenous Q12H   sodium chloride, acetaminophen, sodium chloride flush  Assessment/ Plan:  58 y.o. male with diabetes, hypertension, history of stroke with right-sided weakness, history of alcohol abuse, aortic atherosclerosis, emphysema was admitted on 12/06/2019 for  Principal Problem:   Acute respiratory failure with hypoxia (HCC) Active Problems:   Uncontrolled diabetes mellitus (HCC)   Alcohol abuse   HTN (hypertension)   CVA (cerebral vascular accident) ([place])   Chronic osteomyelitis of toe of left foot (HCC)   Diabetic foot infection ([place])   Acute pulmonary edema ([place])  Acute respiratory failure with hypoxia ([place]) [J96.01] ESRD on hemodialysis ([place]) [N18.6, Z99.2] Other hypervolemia [E87.79]  #. ESRD with volume overload Patient underwent emergent hemodialysis Sunday night.  2500 cc of volume was removed Patient underwent routine hemodialysis today.  Tolerated well. Right arm AV graft placed by Dr. Delana Meyer November 25, 2019.  #. Anemia of CKD  Lab Results  Component Value Date   HGB 7.9 (L) 12/07/2019   Low dose EPO with HD  #. Secondary hyperparathyroidism of renal origin N 25.81   No results found for: PTH Lab Results  Component Value Date   PHOS 3.4 08/25/2019   Monitor calcium and phos level during this admission   #. Diabetes type 2 with CKD Hemoglobin A1C (%)  Date Value  01/17/2014 < 3.5 (L)   Hgb A1c MFr Bld (%)  Date Value  12/07/2019 7.6 (H)  Poorly controlled Recent admission last month for hyperosmolar hyperglycemic state.  Hypoglycemic this time.   LOS: 2 Maisee Vollman 7/27/20212:43 PM  [facility] [place], [place]

## 2019-12-09 ENCOUNTER — Other Ambulatory Visit (INDEPENDENT_AMBULATORY_CARE_PROVIDER_SITE_OTHER): Payer: Self-pay | Admitting: Vascular Surgery

## 2019-12-09 DIAGNOSIS — I96 Gangrene, not elsewhere classified: Secondary | ICD-10-CM

## 2019-12-09 DIAGNOSIS — J9601 Acute respiratory failure with hypoxia: Secondary | ICD-10-CM

## 2019-12-09 DIAGNOSIS — N186 End stage renal disease: Secondary | ICD-10-CM

## 2019-12-09 LAB — GLUCOSE, CAPILLARY
Glucose-Capillary: 149 mg/dL — ABNORMAL HIGH (ref 70–99)
Glucose-Capillary: 298 mg/dL — ABNORMAL HIGH (ref 70–99)
Glucose-Capillary: 384 mg/dL — ABNORMAL HIGH (ref 70–99)
Glucose-Capillary: 241 mg/dL — ABNORMAL HIGH (ref 70–99)
Glucose-Capillary: 275 mg/dL — ABNORMAL HIGH (ref 70–99)

## 2019-12-09 LAB — CBC
HCT: 25.2 % — ABNORMAL LOW (ref 39.0–52.0)
Hemoglobin: 8.4 g/dL — ABNORMAL LOW (ref 13.0–17.0)
MCH: 31.7 pg (ref 26.0–34.0)
MCHC: 33.3 g/dL (ref 30.0–36.0)
MCV: 95.1 fL (ref 80.0–100.0)
Platelets: 154 10*3/uL (ref 150–400)
RBC: 2.65 MIL/uL — ABNORMAL LOW (ref 4.22–5.81)
RDW: 15.7 % — ABNORMAL HIGH (ref 11.5–15.5)
WBC: 5.3 10*3/uL (ref 4.0–10.5)
nRBC: 0 % (ref 0.0–0.2)

## 2019-12-09 MED ORDER — INSULIN GLARGINE 100 UNIT/ML ~~LOC~~ SOLN
10.0000 [IU] | Freq: Every day | SUBCUTANEOUS | Status: DC
  Administered 2019-12-09: 23:00:00 10 [IU] via SUBCUTANEOUS
  Filled 2019-12-09 (×2): qty 0.1

## 2019-12-09 MED ORDER — PIPERACILLIN-TAZOBACTAM 3.375 G IVPB
3.3750 g | Freq: Three times a day (TID) | INTRAVENOUS | Status: DC
  Administered 2019-12-09 – 2019-12-10 (×2): 3.375 g via INTRAVENOUS
  Filled 2019-12-09 (×4): qty 50

## 2019-12-09 NOTE — Progress Notes (Signed)
Initial Nutrition Assessment  DOCUMENTATION CODES:   Severe malnutrition in context of chronic illness, Underweight  INTERVENTION:  Provide double protein portions at meals.  Continue Nepro Shake po TID, each supplement provides 425 kcal and 19 grams protein.  Continue Rena-vite QHS.  NUTRITION DIAGNOSIS:   Severe Malnutrition related to chronic illness (ESRD on HD) as evidenced by severe fat depletion, severe muscle depletion.  GOAL:   Patient will meet greater than or equal to 90% of their needs  MONITOR:   PO intake, Supplement acceptance, Labs, Weight trends, I & O's, Skin  REASON FOR ASSESSMENT:   Consult Assessment of nutrition requirement/status  ASSESSMENT:   59 year old male with PMHx of HTN, HLD, GERD, hx CVA, DM, hx EtOH abuse (no EtOH use x 1 year), ESRD on HD admitted with acute pulmonary edema secondary to fluid overload, diabetic foot infection.   Met with patient at bedside. He reports he has a good appetite and intake now and at home. He is eating 100% of his meals here and reports he is still hungry. He reports that at home he eats 3 meals per day and meals are usually prepared by his wife. He is unable to describe exact details of usual intake but reports he eats well at meals. He does not drink oral nutrition supplements at home. He is drinking Nepro TID here and encouraged patient to add oral nutrition supplement in at home, as well. He reports he has been on dialysis for 10 years now.  Patient reports his UBW was 185 lbs and he has lost weight over time. Weights in chart appear to fluctuate significantly, likely from fluid status, so unable to trend. Patient is currently 57.3 kg (126.4 lbs).  Medications reviewed and include: Epogen 4000 units during HD, Novolog 0-6 units TID, Novolog 0-5 units QHS, Lantus 10 units daily, Rena-vite QHS.  Labs reviewed: CBG [id number].  NUTRITION - FOCUSED PHYSICAL EXAM:    Most Recent Value  Orbital Region Severe  depletion  Upper Arm Region Severe depletion  Thoracic and Lumbar Region Severe depletion  Buccal Region Severe depletion  Temple Region Severe depletion  Clavicle Bone Region Severe depletion  Clavicle and Acromion Bone Region Severe depletion  Scapular Bone Region Unable to assess  Dorsal Hand Severe depletion  Patellar Region Severe depletion  Anterior Thigh Region Severe depletion  Posterior Calf Region Severe depletion  Edema (RD Assessment) None  Hair Reviewed  Eyes Reviewed  Mouth Reviewed  Skin Reviewed  Nails Reviewed     Diet Order:   Diet Order            Diet NPO time specified  Diet effective midnight           Diet Carb Modified Fluid consistency: Thin; Room service appropriate? Yes  Diet effective now                EDUCATION NEEDS:   No education needs have been identified at this time  Skin:  Skin Assessment: Skin Integrity Issues: Skin Integrity Issues:: Diabetic Ulcer Diabetic Ulcer: right second toe (0.1cm x 0.1cm)  Last BM:  12/09/2019 - medium type 5  Height:   Ht Readings from Last 1 Encounters:  12/06/19 _0  (1.803 m)   Weight:   Wt Readings from Last 1 Encounters:  12/07/19 57.3 kg   BMI:  Body mass index is 17.63 kg/m.  Estimated Nutritional Needs:   Kcal:  1800-2000  Protein:  90-100 grams  Fluid:  UOP +  1 L  Jacklynn Barnacle, MS, RD, LDN Pager number available on Amion

## 2019-12-09 NOTE — Consult Note (Signed)
[facility] Vascular Consult Note  MRN : [id number]  CORRION STIREWALT is a 59 y.o. (November 20, 1960) male who presents with chief complaint of  Chief Complaint  Patient presents with  . Hypoglycemia   History of Present Illness: DAVARI LOPES is a 59 year old male with a history of CKD, diabetes, GERD, hypertensionbrought to the [facility] by [facility] due to hypoglycemia.  Patient endorses a history of waking up yesterday morning at his baseline, took 3 units of insulin, ate some oatmeal and then felt very tired and went to lay down.  A few hours later the patient is wife was unable to "wake him up" later and called EMS.  Initial blood sugar by fire department was 26.  The patient was treated with D50 in the emergency department regain normal mental status. However while he was being evaluated patient was noted to be very hypoxic with O2 saturations of 80% on room air. Patient did admit to shortness of breath and was not sure whether he has had a cough or not. Patient specifically denies any chest pain or pressure. Per patient's wife, patient left dialysis early yesterday (he is normally dialyzed Tuesday Thursday and Saturday) because of diarrhea.  The patient was found to be severely fluid overloaded requiring emergent HD.  We have seen the patient in our clinic as recently as November 25, 2019.  During that time, the patient underwent vein mapping which was amenable to creation of a right brachial axillary AV graft as he is left handed.  The patient dorsal history of a chronic wound to the left second toe.  There is some purulent drainage.  Denies any claudication or rest pain symptoms.  MRI of the left foot was positive for osteomyelitis.  Vascular surgery was consulted by Dr. Lonny Prude for possible endovascular intervention.  Current Facility-Administered Medications  Medication Dose Route Frequency Provider Last Rate Last Admin  . 0.9 %   sodium chloride infusion  250 mL Intravenous PRN Bonnell Public Tublu, MD      . acetaminophen (TYLENOL) tablet 650 mg  650 mg Oral Q6H PRN Vashti Hey, MD   650 mg at 12/07/19 0247  . amLODipine (NORVASC) tablet 5 mg  5 mg Oral Q0600 Bonnell Public Tublu, MD   5 mg at 12/09/19 0540  . aspirin EC tablet 81 mg  81 mg Oral Daily Bonnell Public Tublu, MD   81 mg at 12/09/19 7035  . carvedilol (COREG) tablet 6.25 mg  6.25 mg Oral BID WC Bonnell Public Tublu, MD   6.25 mg at 12/09/19 0936  . Chlorhexidine Gluconate Cloth 2 % PADS 6 each  6 each Topical Daily Mariel Aloe, MD   6 each at 12/08/19 1450  . [START ON 12/10/2019] epoetin alfa (EPOGEN) injection 4,000 Units  4,000 Units Intravenous Q T,Th,Sa-HD Singh, Harmeet, MD      . feeding supplement (NEPRO CARB STEADY) liquid 237 mL  237 mL Oral TID BM Bonnell Public Tublu, MD 0 mL/hr at 12/07/19 0259 237 mL at 12/09/19 0937  . heparin injection 5,000 Units  5,000 Units Subcutaneous Q8H Mariel Aloe, MD   5,000 Units at 12/09/19 0540  . hydrALAZINE (APRESOLINE) tablet 25 mg  25 mg Oral Q8H Bonnell Public Tublu, MD   25 mg at 12/09/19 0540  . insulin aspart (novoLOG) injection 0-5 Units  0-5 Units Subcutaneous QHS Lang Snow, NP   3 Units at 12/08/19 2221  . insulin aspart (  novoLOG) injection 0-6 Units  0-6 Units Subcutaneous TID WC Vashti Hey, MD   3 Units at 12/08/19 1649  . insulin glargine (LANTUS) injection 5 Units  5 Units Subcutaneous Daily Mariel Aloe, MD   5 Units at 12/08/19 2222  . MEDLINE mouth rinse  15 mL Mouth Rinse BID Mariel Aloe, MD   15 mL at 12/09/19 4098  . melatonin tablet 2.5 mg  2.5 mg Oral QHS Bonnell Public Tublu, MD   2.5 mg at 12/08/19 2221  . multivitamin (RENA-VIT) tablet 1 tablet  1 tablet Oral QHS Mariel Aloe, MD   1 tablet at 12/08/19 2221  . pantoprazole (PROTONIX) EC tablet 40 mg  40 mg Oral Daily Bonnell Public Tublu, MD    40 mg at 12/09/19 0937  . sacubitril-valsartan (ENTRESTO) 24-26 mg per tablet  1 tablet Oral BID Vashti Hey, MD   1 tablet at 12/09/19 1191  . sertraline (ZOLOFT) tablet 100 mg  100 mg Oral Daily Bonnell Public Tublu, MD   100 mg at 12/09/19 0944  . sodium chloride flush (NS) 0.9 % injection 3 mL  3 mL Intravenous Q12H Bonnell Public Tublu, MD   3 mL at 12/09/19 0938  . sodium chloride flush (NS) 0.9 % injection 3 mL  3 mL Intravenous PRN Jamse Arn Kyra Searles, MD       Past Medical History:  Diagnosis Date  . Chronic kidney disease   . Diabetes mellitus without complication ([place])   . ETOH abuse   . GERD (gastroesophageal reflux disease)   . Hyperlipidemia   . Hypertension   . Stroke [facility])    Past Surgical History:  Procedure Laterality Date  . AV FISTULA PLACEMENT Right 11/25/2019   Procedure: INSERTION OF ARTERIOVENOUS (AV) GORE-TEX GRAFT ARM;  Surgeon: Katha Cabal, MD;  Location: [facility] ORS;  Service: Vascular;  Laterality: Right;  . COLONOSCOPY WITH PROPOFOL N/A 03/06/2019   Procedure: COLONOSCOPY WITH PROPOFOL;  Surgeon: Lucilla Lame, MD;  Location: [facility] ENDOSCOPY;  Service: Endoscopy;  Laterality: N/A;  . DIALYSIS/PERMA CATHETER INSERTION N/A 04/10/2019   Procedure: DIALYSIS/PERMA CATHETER INSERTION;  Surgeon: Serafina Mitchell, MD;  Location: [facility] CV LAB;  Service: Cardiovascular;  Laterality: N/A;  . ESOPHAGOGASTRODUODENOSCOPY (EGD) WITH PROPOFOL N/A 07/01/2016   Procedure: ESOPHAGOGASTRODUODENOSCOPY (EGD) WITH PROPOFOL;  Surgeon: San Jetty, MD;  Location: [facility] ENDOSCOPY;  Service: General;  Laterality: N/A;   Social History Social History   Tobacco Use  . Smoking status: Current Every Day Smoker    Packs/day: 0.25    Years: 5.00    Pack years: 1.25    Types: Cigars  . Smokeless tobacco: Never Used  Vaping Use  . Vaping Use: Never used  Substance Use Topics  . Alcohol use: Yes    Alcohol/week: 16.0 standard drinks    Types:  14 Cans of beer, 2 Shots of liquor per week  . Drug use: No   Family History Family History  Problem Relation Age of Onset  . CAD Brother   . Dementia Mother   . Renal Disease Father   Denies family history of peripheral artery disease or venous disease.  Positive family history of renal disease.  Allergies  Allergen Reactions  . Ferrous Gluconate Nausea And Vomiting  . Other    REVIEW OF SYSTEMS (Negative unless checked)  Constitutional: [] Weight loss  [] Fever  [] Chills Cardiac: [] Chest pain   [] Chest pressure   [] Palpitations   [] Shortness of breath when laying flat   []   Shortness of breath at rest   [x] Shortness of breath with exertion. Vascular:  [] Pain in legs with walking   [] Pain in legs at rest   [] Pain in legs when laying flat   [] Claudication   [] Pain in feet when walking  [] Pain in feet at rest  [] Pain in feet when laying flat   [] History of DVT   [] Phlebitis   [] Swelling in legs   [] Varicose veins   [x] Non-healing ulcers Pulmonary:   [] Uses home oxygen   [] Productive cough   [] Hemoptysis   [] Wheeze  [] COPD   [] Asthma Neurologic:  [] Dizziness  [] Blackouts   [] Seizures   [] History of stroke   [] History of TIA  [] Aphasia   [] Temporary blindness   [] Dysphagia   [] Weakness or numbness in arms   [] Weakness or numbness in legs Musculoskeletal:  [] Arthritis   [] Joint swelling   [] Joint pain   [] Low back pain Hematologic:  [] Easy bruising  [] Easy bleeding   [] Hypercoagulable state   [] Anemic  [] Hepatitis Gastrointestinal:  [] Blood in stool   [] Vomiting blood  [] Gastroesophageal reflux/heartburn   [] Difficulty swallowing. Genitourinary:  [x] Chronic kidney disease   [] Difficult urination  [] Frequent urination  [] Burning with urination   [] Blood in urine Skin:  [] Rashes   [x] Ulcers   [x] Wounds Psychological:  [] History of anxiety   []  History of major depression.  Physical Examination  Vitals:   12/09/19 0357 12/09/19 0500 12/09/19 0546 12/09/19 0801  BP: (!) 130/81  (!) 151/75 (!)  151/80  Pulse: 90   97  Resp: 18  15 17   Temp: 98.6 F (37 C)   97.6 F (36.4 C)  TempSrc: Oral     SpO2: (!) 89% 94% 94% 100%  Weight:      Height:       Body mass index is 17.63 kg/m. Gen:  WD/WN, NAD Head: [place]/AT, No temporalis wasting. Prominent temp pulse not noted. Ear/Nose/Throat: Hearing grossly intact, nares w/o erythema or drainage, oropharynx w/o Erythema/Exudate Eyes: Sclera non-icteric, conjunctiva clear Neck: Trachea midline.  No JVD.  Pulmonary:  Good air movement, respirations not labored, equal bilaterally.  Cardiac: RRR, normal S1, S2. Vascular:  Vessel Right Left  Radial Palpable Palpable  Ulnar Palpable Palpable  Brachial Palpable Palpable  Carotid Palpable, without bruit Palpable, without bruit  Aorta Not palpable N/A  Femoral Palpable Palpable  Popliteal Palpable Palpable  PT Non-Palpable Non-Palpable  DP Non-Palpable Non-Palpable   Left lower extremity: Thigh soft.  Calf soft.  Extremities warm distally to toes.  Wound noted to second toe.  Hard to palpate pedal pulses.  Gastrointestinal: soft, non-tender/non-distended. No guarding/reflex.  Musculoskeletal: M/S 5/5 throughout.  Extremities without ischemic changes.  No deformity or atrophy. No edema. Neurologic: Sensation grossly intact in extremities.  Symmetrical.  Speech is fluent. Motor exam as listed above. Psychiatric: Judgment intact, Mood & affect appropriate for pt's clinical situation. Dermatologic: As above Lymph : No Cervical, Axillary, or Inguinal lymphadenopathy.  CBC Lab Results  Component Value Date   WBC 5.3 12/09/2019   HGB 8.4 (L) 12/09/2019   HCT 25.2 (L) 12/09/2019   MCV 95.1 12/09/2019   PLT 154 12/09/2019   BMET    Component Value Date/Time   NA 141 12/07/2019 0639   NA 143 01/20/2014 0443   K 3.1 (L) 12/07/2019 0639   K 4.6 01/20/2014 0443   CL 101 12/07/2019 0639   CL 116 (H) 01/20/2014 0443   CO2 29 12/07/2019 0639   CO2 22 01/20/2014 0443   GLUCOSE 288 (  H)  12/07/2019 0639   GLUCOSE 94 01/20/2014 0443   BUN 26 (H) 12/07/2019 0639   BUN 18 01/20/2014 0443   CREATININE 2.62 (H) 12/07/2019 0639   CREATININE 1.32 (H) 01/20/2014 0443   CALCIUM 7.8 (L) 12/07/2019 0639   CALCIUM 7.3 (L) 01/20/2014 0443   GFRNONAA 25 (L) 12/07/2019 0639   GFRNONAA >60 01/20/2014 0443   GFRAA 29 (L) 12/07/2019 0639   GFRAA >60 01/20/2014 0443   Estimated Creatinine Clearance: 24.3 mL/min (A) (by C-G formula based on SCr of 2.62 mg/dL (H)).  COAG Lab Results  Component Value Date   INR 1.1 11/25/2019   INR 1.0 07/17/2019   INR 0.93 02/26/2018   Radiology CT Angio Chest PE W and/or Wo Contrast  Result Date: 12/06/2019 CLINICAL DATA:  High clinical suspicion of pulmonary embolism. End-stage renal disease on hemodialysis for 2 years. EXAM: CT ANGIOGRAPHY CHEST WITH CONTRAST TECHNIQUE: Multidetector CT imaging of the chest was performed using the standard protocol during bolus administration of intravenous contrast. Multiplanar CT image reconstructions and MIPs were obtained to evaluate the vascular anatomy. CONTRAST:  49mL OMNIPAQUE IOHEXOL 350 MG/ML SOLN COMPARISON:  Noncontrast chest CT 05/27/2019. Abdominal CT 07/17/2019. FINDINGS: Cardiovascular: The pulmonary arteries are well opacified with contrast to the level of the subsegmental branches. There is no evidence of acute pulmonary embolism. Atherosclerosis of the aorta, great vessels and coronary arteries. There is limited opacification of the systemic vessels, but no acute vascular findings are seen. The heart is mildly enlarged. There is a small pericardial effusion. Right IJ hemodialysis catheter extends to the superior cavoatrial junction. Mediastinum/Nodes: Mildly progressive mediastinal and hilar adenopathy, largest a right paratracheal node measuring 16 mm on image 21/4. No axillary adenopathy. The thyroid gland, trachea and esophagus demonstrate no significant findings. Lungs/Pleura: Moderate-sized dependent  pleural effusions bilaterally with associated bibasilar atelectasis. In addition, there are diffusely increased septal markings throughout both lungs, suspicious for edema superimposed on emphysema. No confluent airspace opacity or suspicious pulmonary nodularity. Upper abdomen: Enlarging, incompletely visualized fluid collection in the left upper quadrant of the abdomen, measuring up to 8.0 x 5.2 cm on image 94/4. This lies between the stomach and spleen and has enlarged compared with previous abdominal CT where there was a much smaller collection adjacent to a calcified pancreatic tail. Appearance suggests a pancreatic pseudocyst. Generalized soft tissue edema throughout the upper abdomen without other focal fluid collection. Musculoskeletal/Chest wall: There is no chest wall mass or suspicious osseous finding. Generalized soft tissue edema consistent with anasarca/volume overload. Review of the MIP images confirms the above findings. IMPRESSION: 1. No evidence of acute pulmonary embolism. 2. Cardiomegaly, bilateral pleural effusions and diffuse septal thickening in both lungs most consistent with congestive heart failure/volume overload. Associated generalized soft tissue edema. 3. Mildly progressive mediastinal and hilar adenopathy, likely reactive. 4. Enlarging, incompletely visualized fluid collection in the left upper quadrant of the abdomen, suspicious for a pancreatic pseudocyst. 5. Aortic Atherosclerosis (ICD10-I70.0) and Emphysema (ICD10-J43.9). Electronically Signed   By: Richardean Sale M.D.   On: 12/06/2019 15:37   MR FOOT LEFT WO CONTRAST  Result Date: 12/08/2019 CLINICAL DATA:  Question of osteomyelitis of the second toe diabetic foot infection EXAM: MRI OF THE LEFT FOOT WITHOUT CONTRAST TECHNIQUE: Multiplanar, multisequence MR imaging of the left was performed. No intravenous contrast was administered. COMPARISON:  December 07, 2019 radiograph FINDINGS: Bones/Joint/Cartilage There is cortical  irregularity with erosive type changes seen through the distal second phalanx. There is T2 hyperintense signal with T1  hypointensity seen throughout the distal phalanx. There is increased T2 hyperintense signal seen within the middle phalanx of the second digit without T1 hypointensity or cortical irregularity. Erosive type change or prior osteotomies are seen at the fourth and third MTP joints. There is also mildly increased signal seen within the metatarsals and midfoot without associated T1 hypointensity. Ligaments The Lisfranc ligaments appear to be intact. Muscles and Tendons Fatty atrophy with increased signal seen throughout the muscles of the forefoot. The flexor and extensor tendons appear to be grossly intact. Soft tissues Area of ulceration seen overlying distal second phalanx with diffuse soft tissue edema and skin thickening. No loculated fluid collection or sinus tract is seen. There is mild dorsal soft tissue swelling seen over the forefoot. IMPRESSION: 1. Area of ulceration over the distal second digit with findings of acute osteomyelitis involving the distal phalanx. No loculated fluid collections or sinus tract. 2. Reactive marrow edema within the middle phalanx of the second digit and midfoot. Electronically Signed   By: Prudencio Pair M.D.   On: 12/08/2019 21:54   US ARTERIAL ABI (SCREENING LOWER EXTREMITY)  Result Date: 12/08/2019 CLINICAL DATA:  Left second toe infection. Diabetes, hypertension, hyperlipidemia, renal failure, history of tobacco abuse EXAM: NONINVASIVE PHYSIOLOGIC VASCULAR STUDY OF BILATERAL LOWER EXTREMITIES TECHNIQUE: Evaluation of both lower extremities were performed at rest, including calculation of ankle-brachial indices with single level Doppler, pressure and pulse volume recording. COMPARISON:  None available FINDINGS: Right ABI: Non calculable due to vascular noncompressibility. Toe brachial index 0.5 Left ABI: Non calculable due to vascular noncompressibility. Toe  brachial index 0.45 Right Lower Extremity:  Biphasic distal arterial waveforms Left Lower Extremity:  Biphasic distal arterial waveforms IMPRESSION: ABIs are non diagnostic secondary to incompressible vessel calcifications (medial arterial sclerosis of [place]). Decreased toe brachial indices consistent with at least moderate bilateral arterial occlusive disease at rest. Electronically Signed   By: Lucrezia Europe M.D.   On: 12/08/2019 13:13   DG Chest Portable 1 View  Result Date: 12/06/2019 CLINICAL DATA:  58-year-old male with history of cough and hypoxia. EXAM: PORTABLE CHEST 1 VIEW COMPARISON:  Chest x-ray 09/16/2019. FINDINGS: Right-sided PermCath with tip terminating at the superior cavoatrial junction. There is cephalization of the pulmonary vasculature and slight indistinctness of the interstitial markings suggestive of mild pulmonary edema. Small left pleural effusion. No right pleural effusion. No pneumothorax. Heart size is mildly enlarged. Upper mediastinal contours are within normal limits. Aortic atherosclerosis. IMPRESSION: 1. The appearance of the chest suggests mild congestive heart failure, as above. 2. Aortic atherosclerosis. Electronically Signed   By: Vinnie Langton M.D.   On: 12/06/2019 13:22   DG Foot Complete Left  Result Date: 12/07/2019 CLINICAL DATA:  59 year old male with diabetic foot infection. EXAM: LEFT FOOT - COMPLETE 3+ VIEW COMPARISON:  Left ankle radiograph dated 11/09/2019. FINDINGS: There is no acute fracture or dislocation. The bones are osteopenic. Erosive changes of the middle and distal phalanx of the second toe, progressed since the prior radiograph and concerning for osteomyelitis. Clinical correlation is recommended. There are erosive changes versus osteotomy of the third and fourth MTP joints similar to prior radiograph. There is soft tissue swelling and small pockets of soft tissue air in the distal second toe in keeping with ulcer. Vascular calcifications  noted. IMPRESSION: 1. No acute fracture or dislocation. 2. Ulceration of the distal aspect of the second toe with findings concerning for osteomyelitis of the middle and distal phalanges of the second toe. MRI or a WBC nuclear  scan may provide better evaluation. Electronically Signed   By: Anner Crete M.D.   On: 12/07/2019 19:05   DG Foot Complete Left  Result Date: 11/09/2019 Please see detailed radiograph report in office note.  DG Foot Complete Right  Result Date: 12/07/2019 CLINICAL DATA:  Bilateral diabetic foot infection. Bilateral foot abscess. EXAM: RIGHT FOOT COMPLETE - 3+ VIEW COMPARISON:  Remote right foot exam 03/01/2018 FINDINGS: No bony destruction, periosteal reaction, or abnormal bone density to suggest osteomyelitis. No visualized large soft tissue defects or soft tissue air. Osteoarthritis of the first metatarsal phalangeal joint again seen, slight progression from 2019. slight hammertoe deformity of the fourth digit. Alignment otherwise maintained. There are vascular calcifications. IMPRESSION: 1. No radiographic evidence of osteomyelitis. No visualized large soft tissue defects/ulcer or soft tissue air. 2. Osteoarthritis of the first metatarsophalangeal joint, slightly progressed from 2019. 3. Vascular calcifications. Electronically Signed   By: Keith Rake M.D.   On: 12/07/2019 19:01   ECHOCARDIOGRAM COMPLETE  Result Date: 12/07/2019    ECHOCARDIOGRAM REPORT   Patient Name:   ADA WOODBURY Date of Exam: 12/07/2019 Medical Rec #:  [id number]     Height:       71.0 in Accession #:    [id number]    Weight:       126.4 lb Date of Birth:  03-Dec-1960     BSA:          1.735 m Patient Age:    40 years      BP:           136/75 mmHg Patient Gender: M             HR:           85 bpm. Exam Location:  [facility] Procedure: 2D Echo, Color Doppler and Cardiac Doppler Indications:     CHF 428.0  History:         Patient has no prior history of Echocardiogram examinations.                   Stroke; Risk Factors:Hypertension, Diabetes and Dyslipidemia.                  CKD, ETOH abuse.  Sonographer:     Sherrie Sport RDCS (AE) Referring Phys:  [id number] [place] Diagnosing Phys: Isaias Cowman MD IMPRESSIONS  1. Left ventricular ejection fraction, by estimation, is 45 to 50%. The left ventricle has mildly decreased function. The left ventricle has no regional wall motion abnormalities. Left ventricular diastolic parameters were normal.  2. Right ventricular systolic function is normal. The right ventricular size is normal. There is severely elevated pulmonary artery systolic pressure.  3. Left atrial size was moderately dilated.  4. The mitral valve is normal in structure. Moderate to severe mitral valve regurgitation. No evidence of mitral stenosis.  5. Tricuspid valve regurgitation is moderate.  6. The aortic valve is normal in structure. Aortic valve regurgitation is mild. No aortic stenosis is present.  7. The inferior vena cava is normal in size with greater than 50% respiratory variability, suggesting right atrial pressure of 3 mmHg. FINDINGS  Left Ventricle: Left ventricular ejection fraction, by estimation, is 45 to 50%. The left ventricle has mildly decreased function. The left ventricle has no regional wall motion abnormalities. The left ventricular internal cavity size was normal in size. There is no left ventricular hypertrophy. Left ventricular diastolic parameters were normal. Right Ventricle: The right ventricular size is normal. No increase  in right ventricular wall thickness. Right ventricular systolic function is normal. There is severely elevated pulmonary artery systolic pressure. The tricuspid regurgitant velocity is 3.56 m/s, and with an assumed right atrial pressure of 10 mmHg, the estimated right ventricular systolic pressure is 37.9 mmHg. Left Atrium: Left atrial size was moderately dilated. Right Atrium: Right atrial size was normal in size. Pericardium: There  is no evidence of pericardial effusion. Mitral Valve: The mitral valve is normal in structure. Normal mobility of the mitral valve leaflets. Moderate to severe mitral valve regurgitation. No evidence of mitral valve stenosis. Tricuspid Valve: The tricuspid valve is normal in structure. Tricuspid valve regurgitation is moderate . No evidence of tricuspid stenosis. Aortic Valve: The aortic valve is normal in structure. Aortic valve regurgitation is mild. No aortic stenosis is present. Aortic valve mean gradient measures 4.0 mmHg. Aortic valve peak gradient measures 6.8 mmHg. Aortic valve area, by VTI measures 2.49 cm. Pulmonic Valve: The pulmonic valve was normal in structure. Pulmonic valve regurgitation is not visualized. No evidence of pulmonic stenosis. Aorta: The aortic root is normal in size and structure. Venous: The inferior vena cava is normal in size with greater than 50% respiratory variability, suggesting right atrial pressure of 3 mmHg. IAS/Shunts: No atrial level shunt detected by color flow Doppler.  LEFT VENTRICLE PLAX 2D LVIDd:         5.30 cm      Diastology LVIDs:         4.03 cm      LV e' lateral:   10.10 cm/s LV PW:         1.13 cm      LV E/e' lateral: 10.1 LV IVS:        1.07 cm      LV e' medial:    6.74 cm/s LVOT diam:     2.10 cm      LV E/e' medial:  15.1 LV SV:         58 LV SV Index:   33 LVOT Area:     3.46 cm  LV Volumes (MOD) LV vol d, MOD A2C: 111.0 ml LV vol d, MOD A4C: 129.0 ml LV vol s, MOD A2C: 61.4 ml LV vol s, MOD A4C: 60.0 ml LV SV MOD A2C:     49.6 ml LV SV MOD A4C:     129.0 ml LV SV MOD BP:      59.1 ml RIGHT VENTRICLE RV Basal diam:  3.39 cm RV S prime:     11.30 cm/s TAPSE (M-mode): 4.5 cm LEFT ATRIUM            Index       RIGHT ATRIUM           Index LA diam:      4.50 cm  2.59 cm/m  RA Area:     15.20 cm LA Vol (A2C): 130.0 ml 74.93 ml/m RA Volume:   34.90 ml  20.12 ml/m LA Vol (A4C): 38.6 ml  22.25 ml/m  AORTIC VALVE                   PULMONIC VALVE AV Area  (Vmax):    1.92 cm    PV Vmax:        0.47 m/s AV Area (Vmean):   2.04 cm    PV Peak grad:   0.9 mmHg AV Area (VTI):     2.49 cm    RVOT Peak grad: 3 mmHg AV Vmax:  130.50 cm/s AV Vmean:          92.850 cm/s AV VTI:            0.232 m AV Peak Grad:      6.8 mmHg AV Mean Grad:      4.0 mmHg LVOT Vmax:         72.50 cm/s LVOT Vmean:        54.800 cm/s LVOT VTI:          0.167 m LVOT/AV VTI ratio: 0.72  AORTA Ao Root diam: 2.60 cm MITRAL VALVE                TRICUSPID VALVE MV Area (PHT): 5.34 cm     TR Peak grad:   50.7 mmHg MV Decel Time: 142 msec     TR Vmax:        356.00 cm/s MV E velocity: 102.00 cm/s MV A velocity: 41.10 cm/s   SHUNTS MV E/A ratio:  2.48         Systemic VTI:  0.17 m                             Systemic Diam: 2.10 cm Isaias Cowman MD Electronically signed by Isaias Cowman MD Signature Date/Time: 12/07/2019/4:48:11 PM    Final    Assessment/Plan [name] is a 59 year old male with a history of CKD, diabetes, GERD, [facility]br[facility].  Patient known history of end-stage renal disease was found to be fluid overloaded underwent emergent dialysis.  Patient also found to have chronic wound to the left foot with osteomyelitis on MRI.  1.  Atherosclerotic disease with ulceration: Patient with multiple risk factors for atherosclerotic disease.  Presents with nonhealing chronic wound to the second toe on the left foot with purulent discharge.  MRI positive for osteomyelitis.  Recommend undergoing a left lower extremity angiogram with possible intervention and attempt assess the patient's anatomy and contributing degree of atherosclerotic disease.  If appropriate, an attempt to revascularize leg made at that time.  Procedure, risks and benefits were explained to the patient and his wife at the bedside.  All questions were answered.  Both the patient and the wife would like to  proceed.  2.  Chronic toe wound: We will place consult to podiatry for surgical recommendations. Appreciate WOC recommendations in regard to wound care  3. ESRD: Patient with history of end-stage renal disease on hemodialysis Currently maintained via PermCath Last seen in our clinic on November 25, 2019.  During that time, the patient underwent vein mapping which was amenable to creation of a right brachial axillary AV graft as he is left handed.  Discussed with Dr. Mayme Genta, PA-C  12/09/2019 10:39 AM  This note was created with Dragon medical transcription system.  Any error is purely unintentional.

## 2019-12-09 NOTE — Progress Notes (Signed)
[facility] [place], [place] 12/09/19  Subjective:   LOS: 3  Patient initially presented to ER for decreased responsiveness.  Found to have low blood sugar of 26. Patient cut his dialysis treatment chart on Saturday because of diarrhea.  In the ER, he was also found to be significantly hypoxic.  CT angiogram showed cardiomegaly with changes of pulmonary edema and CHF. Patient was dialyzed emergently at admission and 2500 cc of volume was removed  Overall doing fair today Able to eat without nausea or vomiting Still requiring oxygen by nasal cannula   Objective:  Vital signs in last 24 hours:  Temp:  [97.6 F (36.4 C)-98.6 F (37 C)] 98 F (36.7 C) (07/28 1548) Pulse Rate:  [87-97] 88 (07/28 1548) Resp:  [15-20] 18 (07/28 1548) BP: (130-151)/(74-86) 150/86 (07/28 1548) SpO2:  [89 %-100 %] 98 % (07/28 1548)  Weight change:  Filed Weights   12/06/19 1241 12/07/19 1133  Weight: 72.6 kg 57.3 kg    Intake/Output:    Intake/Output Summary (Last 24 hours) at 12/09/2019 1556 Last data filed at 12/09/2019 0950 Gross per 24 hour  Intake 480 ml  Output --  Net 480 ml     Physical Exam: General:  Appears comfortable, laying in the bed  HEENT  moist oral mucous membranes  Pulm/lungs  decreased breath sounds at bases, [place] O2  CVS/Heart  no rub, irregular rhythm  Abdomen:   Soft, nontender  Extremities:  1+ pitting edema  Neurologic:  Alert, able to answer questions  Skin:  Warm, dry   Right IJ PermCath   Right arm AV graft    Basic Metabolic Panel:  Recent Labs  Lab 12/06/19 1248 12/07/19 0639  NA 139 141  K 3.8 3.1*  CL 97* 101  CO2 30 29  GLUCOSE 53* 288*  BUN 46* 26*  CREATININE 3.85* 2.62*  CALCIUM 8.9 7.8*     CBC: Recent Labs  Lab 12/06/19 1248 12/07/19 0639 12/09/19 0535  WBC 7.0 4.4 5.3  HGB 9.5* 7.9* 8.4*  HCT 29.9* 24.8* 25.2*  MCV 98.4 96.1 95.1  PLT 230 173 154      Lab Results  Component Value Date   HEPBSAG NON  REACTIVE 12/06/2019   HEPBSAB NON REACTIVE 04/10/2019      Microbiology:  Recent Results (from the past 240 hour(s))  Culture, blood (routine x 2)     Status: None (Preliminary result)   Collection Time: 12/06/19  1:54 PM   Specimen: BLOOD  Result Value Ref Range Status   Specimen Description BLOOD LAC  Final   Special Requests   Final    BOTTLES DRAWN AEROBIC AND ANAEROBIC Blood Culture results may not be optimal due to an excessive volume of blood received in culture bottles   Culture   Final    NO GROWTH 3 DAYS Performed at [facility], [address], [place], [place] [address]    Report Status PENDING  Incomplete  Culture, blood (routine x 2)     Status: None (Preliminary result)   Collection Time: 12/06/19  1:54 PM   Specimen: BLOOD  Result Value Ref Range Status   Specimen Description BLOOD L BICEP  Final   Special Requests   Final    BOTTLES DRAWN AEROBIC AND ANAEROBIC Blood Culture results may not be optimal due to an excessive volume of blood received in culture bottles   Culture   Final    NO GROWTH 3 DAYS Performed at [facility]  Lab, [place], [place] [address]    Report Status PENDING  Incomplete  SARS Coronavirus 2 by RT PCR (hospital order, performed in [facility] hospital lab) Nasopharyngeal Nasopharyngeal Swab     Status: None   Collection Time: 12/06/19  1:54 PM   Specimen: Nasopharyngeal Swab  Result Value Ref Range Status   SARS Coronavirus 2 NEGATIVE NEGATIVE Final    Comment: (NOTE) SARS-CoV-2 target nucleic acids are NOT DETECTED.  The SARS-CoV-2 RNA is generally detectable in upper and lower respiratory specimens during the acute phase of infection. The lowest concentration of SARS-CoV-2 viral copies this assay can detect is 250 copies / mL. A negative result does not preclude SARS-CoV-2 infection and should not be used as the sole basis for treatment or other patient management decisions.  A negative result may  occur with improper specimen collection / handling, submission of specimen other than nasopharyngeal swab, presence of viral mutation(s) within the areas targeted by this assay, and inadequate number of viral copies (<250 copies / mL). A negative result must be combined with clinical observations, patient history, and epidemiological information.  Fact Sheet for Patients:   StrictlyIdeas.no  Fact Sheet for Healthcare Providers: BankingDealers.co.za  This test is not yet approved or  cleared by the [place] FDA and has been authorized for detection and/or diagnosis of SARS-CoV-2 by FDA under an Emergency Use Authorization (EUA).  This EUA will remain in effect (meaning this test can be used) for the duration of the COVID-19 declaration under Section 564(b)(1) of the Act, 21 U.S.C. section 360bbb-3(b)(1), unless the authorization is terminated or revoked sooner.  Performed at [facility], [address] [place], [place] [address]     Coagulation Studies: No results for input(s): LABPROT, INR in the last 72 hours.  Urinalysis: No results for input(s): COLORURINE, LABSPEC, PHURINE, GLUCOSEU, HGBUR, BILIRUBINUR, KETONESUR, PROTEINUR, UROBILINOGEN, NITRITE, LEUKOCYTESUR in the last 72 hours.  Invalid input(s): APPERANCEUR    Imaging: MR FOOT LEFT WO CONTRAST  Result Date: 12/08/2019 CLINICAL DATA:  Question of osteomyelitis of the second toe diabetic foot infection EXAM: MRI OF THE LEFT FOOT WITHOUT CONTRAST TECHNIQUE: Multiplanar, multisequence MR imaging of the left was performed. No intravenous contrast was administered. COMPARISON:  December 07, 2019 radiograph FINDINGS: Bones/Joint/Cartilage There is cortical irregularity with erosive type changes seen through the distal second phalanx. There is T2 hyperintense signal with T1 hypointensity seen throughout the distal phalanx. There is increased T2 hyperintense signal seen  within the middle phalanx of the second digit without T1 hypointensity or cortical irregularity. Erosive type change or prior osteotomies are seen at the fourth and third MTP joints. There is also mildly increased signal seen within the metatarsals and midfoot without associated T1 hypointensity. Ligaments The Lisfranc ligaments appear to be intact. Muscles and Tendons Fatty atrophy with increased signal seen throughout the muscles of the forefoot. The flexor and extensor tendons appear to be grossly intact. Soft tissues Area of ulceration seen overlying distal second phalanx with diffuse soft tissue edema and skin thickening. No loculated fluid collection or sinus tract is seen. There is mild dorsal soft tissue swelling seen over the forefoot. IMPRESSION: 1. Area of ulceration over the distal second digit with findings of acute osteomyelitis involving the distal phalanx. No loculated fluid collections or sinus tract. 2. Reactive marrow edema within the middle phalanx of the second digit and midfoot. Electronically Signed   By: Prudencio Pair M.D.   On: 12/08/2019 21:54   US  ARTERIAL ABI (SCREENING LOWER EXTREMITY)  Result Date: 12/08/2019 CLINICAL DATA:  Left second toe infection. Diabetes, hypertension, hyperlipidemia, renal failure, history of tobacco abuse EXAM: NONINVASIVE PHYSIOLOGIC VASCULAR STUDY OF BILATERAL LOWER EXTREMITIES TECHNIQUE: Evaluation of both lower extremities were performed at rest, including calculation of ankle-brachial indices with single level Doppler, pressure and pulse volume recording. COMPARISON:  None available FINDINGS: Right ABI: Non calculable due to vascular noncompressibility. Toe brachial index 0.5 Left ABI: Non calculable due to vascular noncompressibility. Toe brachial index 0.45 Right Lower Extremity:  Biphasic distal arterial waveforms Left Lower Extremity:  Biphasic distal arterial waveforms IMPRESSION: ABIs are non diagnostic secondary to incompressible vessel  calcifications (medial arterial sclerosis of [place]). Decreased toe brachial indices consistent with at least moderate bilateral arterial occlusive disease at rest. Electronically Signed   By: Lucrezia Europe M.D.   On: 12/08/2019 13:13   DG Foot Complete Left  Result Date: 12/07/2019 CLINICAL DATA:  59 year old male with diabetic foot infection. EXAM: LEFT FOOT - COMPLETE 3+ VIEW COMPARISON:  Left ankle radiograph dated 11/09/2019. FINDINGS: There is no acute fracture or dislocation. The bones are osteopenic. Erosive changes of the middle and distal phalanx of the second toe, progressed since the prior radiograph and concerning for osteomyelitis. Clinical correlation is recommended. There are erosive changes versus osteotomy of the third and fourth MTP joints similar to prior radiograph. There is soft tissue swelling and small pockets of soft tissue air in the distal second toe in keeping with ulcer. Vascular calcifications noted. IMPRESSION: 1. No acute fracture or dislocation. 2. Ulceration of the distal aspect of the second toe with findings concerning for osteomyelitis of the middle and distal phalanges of the second toe. MRI or a WBC nuclear scan may provide better evaluation. Electronically Signed   By: Anner Crete M.D.   On: 12/07/2019 19:05   DG Foot Complete Right  Result Date: 12/07/2019 CLINICAL DATA:  Bilateral diabetic foot infection. Bilateral foot abscess. EXAM: RIGHT FOOT COMPLETE - 3+ VIEW COMPARISON:  Remote right foot exam 03/01/2018 FINDINGS: No bony destruction, periosteal reaction, or abnormal bone density to suggest osteomyelitis. No visualized large soft tissue defects or soft tissue air. Osteoarthritis of the first metatarsal phalangeal joint again seen, slight progression from 2019. slight hammertoe deformity of the fourth digit. Alignment otherwise maintained. There are vascular calcifications. IMPRESSION: 1. No radiographic evidence of osteomyelitis. No visualized large soft  tissue defects/ulcer or soft tissue air. 2. Osteoarthritis of the first metatarsophalangeal joint, slightly progressed from 2019. 3. Vascular calcifications. Electronically Signed   By: Keith Rake M.D.   On: 12/07/2019 19:01     Medications:   . sodium chloride     . amLODipine  5 mg Oral Q0600  . aspirin EC  81 mg Oral Daily  . carvedilol  6.25 mg Oral BID WC  . Chlorhexidine Gluconate Cloth  6 each Topical Daily  . [START ON 12/10/2019] epoetin (EPOGEN/PROCRIT) injection  4,000 Units Intravenous Q T,Th,Sa-HD  . feeding supplement (NEPRO CARB STEADY)  237 mL Oral TID BM  . heparin  5,000 Units Subcutaneous Q8H  . hydrALAZINE  25 mg Oral Q8H  . insulin aspart  0-5 Units Subcutaneous QHS  . insulin aspart  0-6 Units Subcutaneous TID WC  . insulin glargine  10 Units Subcutaneous Daily  . mouth rinse  15 mL Mouth Rinse BID  . melatonin  2.5 mg Oral QHS  . multivitamin  1 tablet Oral QHS  . pantoprazole  40 mg Oral Daily  .  sacubitril-valsartan  1 tablet Oral BID  . sertraline  100 mg Oral Daily  . sodium chloride flush  3 mL Intravenous Q12H   sodium chloride, acetaminophen, sodium chloride flush  Assessment/ Plan:  59 y.o. male with diabetes, hypertension, history of stroke with right-sided weakness, history of alcohol abuse, aortic atherosclerosis, emphysema was admitted on 12/06/2019 for  Principal Problem:   Acute respiratory failure with hypoxia (HCC) Active Problems:   Uncontrolled diabetes mellitus (HCC)   Alcohol abuse   HTN (hypertension)   CVA (cerebral vascular accident) ([place])   Chronic osteomyelitis of toe of left foot ([place])   Diabetic foot infection ([place])   Acute pulmonary edema ([place])  Acute respiratory failure with hypoxia ([name]) [J96.01] ESRD on hemodialysis ([place]) [N18.6, Z99.2] Other hypervolemia [E87.79]  #. ESRD with volume overload Patient underwent emergent hemodialysis Sunday night.  2500 cc of volume was removed Routine hemodialysis planned for  tomorrow morning Right arm AV graft placed by Dr. Delana [place] November 25, 2019.  #. Anemia of CKD  Lab Results  Component Value Date   HGB 8.4 (L) 12/09/2019   Low dose EPO with HD  #. Secondary hyperparathyroidism of renal origin N 25.81   No results found for: PTH Lab Results  Component Value Date   PHOS 3.4 08/25/2019   Monitor calcium and phos level during this admission   #. Diabetes type 2 with CKD Hemoglobin A1C (%)  Date Value  01/17/2014 < 3.5 (L)   Hgb A1c MFr Bld (%)  Date Value  12/07/2019 7.6 (H)  Poorly controlled Recent admission last month for hyperosmolar hyperglycemic state.  Hypoglycemic this time.  #Peripheral vascular disease Nonhealing toe ulcers.  MRI positive for osteomyelitis. Angiogram planned for tomorrow Vascular and podiatry team are following.   LOS: 3 Dalayah Deahl 7/28/20213:56 PM  [facility] [place], [place]

## 2019-12-09 NOTE — Consult Note (Addendum)
Pharmacy Antibiotic Note  IVANN TRIMARCO is a 59 y.o. male admitted on 12/06/2019 with gangrene.  Pharmacy has been consulted for pip/tazo dosing.  Plan: Zosyn 3.375g IV q8h (4 hour infusion).  Height: 5\' 11"  (180.3 cm) Weight: 57.3 kg (126 lb 6.4 oz) IBW/kg (Calculated) : 75.3  Temp (24hrs), Avg:98.1 F (36.7 C), Min:97.6 F (36.4 C), Max:98.6 F (37 C)  Recent Labs  Lab 12/06/19 1248 12/06/19 1354 12/06/19 2100 12/07/19 0639 12/09/19 0535  WBC 7.0  --   --  4.4 5.3  CREATININE 3.85*  --   --  2.62*  --   LATICACIDVEN  --  2.3* 1.4  --   --     Estimated Creatinine Clearance: 24.3 mL/min (A) (by C-G formula based on SCr of 2.62 mg/dL (H)).    Allergies  Allergen Reactions  . Ferrous Gluconate Nausea And Vomiting  . Other     Antimicrobials this admission: 7/28 pip/tazo >>   Dose adjustments this admission: None  Microbiology results: 7/25 BCx: pending  Thank you for allowing pharmacy to be a part of this patient's care.  Oswald Hillock, PharmD, BCPS 12/09/2019 7:42 PM

## 2019-12-09 NOTE — Consult Note (Signed)
[facility] Nurse Consult Note: Reason for Consult: patient with known osteomyelitis to left foot, second (2nd) digit. Has been seen by podiatry for toenail debridement. ABI has been performed and hospitalist is determining consultation with Vascular Surgery with orthopedics vs podiatry.  [place] nursing is consulted to provide topical care guidance for Nursing staff. Wound type: infectious Pressure Injury POA: N/A Measurement:Minute scabbing noted about the second digit. Wound bed: N/A Drainage (amount, consistency, odor) None Periwound: darkened discoloration of second digit on left foot Dressing procedure/placement/frequency: I will provide Nursing with conservative guidance for the care of the left foot and affected digit to include daily washing with soap and water, rinsing and thorough drying, particularly between the toes. Nursing will paint any scabbed areas with a betadine swabstick and allow to air-dry. No dressing. Protection will be provided via  Prevalon soft boot while in bed.  [facility] nursing team will not follow, but will remain available to this patient, the nursing and medical teams.  Please re-consult if needed. Thanks, Maudie Flakes, MSN, RN, [place], Arther Abbott  Pager# [phone]

## 2019-12-09 NOTE — Consult Note (Addendum)
NAME: [name]  DOB: [birth year]  MRN: [id number]  Date/Time: 12/09/2019 3:40 PM  REQUESTING PROVIDER: nettey Subjective:  REASON FOR CONSULT: gangrene toe ? [name] is a 59 y.o. with a history of ESRD, DM, HTN presents from home with altered mental status . Pt was found by his wife unable to be woken up and called 911 on 12/06/19 .[name] Fire dept found his blood glucose to be 26.  He had taken insulin and eaten oatmeal but went to sleep.  EMS could not get IV line and hence gave Im glucagon and he was brought to the ED and received D50. Vitals in the ED was temp of 98.7, HR 65, pulse ox 87%, BP 160/101 Pt also was found to be in acute resp failure due to pulmonary edema.  His mental status responded with D50 Pt has a wound left toe and has been taking doxy from his PCP. I am asked to see him for infection He is not on any antibiotics now No fever Pt was seen by vascular and angio  Past Medical History:  Diagnosis Date  . Chronic kidney disease   . Diabetes mellitus without complication ([place])   . ETOH abuse   . GERD (gastroesophageal reflux disease)   . Hyperlipidemia   . Hypertension   . Stroke [facility])     Past Surgical History:  Procedure Laterality Date  . AV FISTULA PLACEMENT Right 11/25/2019   Procedure: INSERTION OF ARTERIOVENOUS (AV) GORE-TEX GRAFT ARM;  Surgeon: Katha Cabal, MD;  Location: [facility] ORS;  Service: Vascular;  Laterality: Right;  . COLONOSCOPY WITH PROPOFOL N/A 03/06/2019   Procedure: COLONOSCOPY WITH PROPOFOL;  Surgeon: Lucilla Lame, MD;  Location: [facility] ENDOSCOPY;  Service: Endoscopy;  Laterality: N/A;  . DIALYSIS/PERMA CATHETER INSERTION N/A 04/10/2019   Procedure: DIALYSIS/PERMA CATHETER INSERTION;  Surgeon: Serafina Mitchell, MD;  Location: [facility] CV LAB;  Service: Cardiovascular;  Laterality: N/A;  . ESOPHAGOGASTRODUODENOSCOPY (EGD) WITH PROPOFOL N/A 07/01/2016   Procedure: ESOPHAGOGASTRODUODENOSCOPY (EGD) WITH PROPOFOL;  Surgeon: San Jetty,  MD;  Location: [facility] ENDOSCOPY;  Service: General;  Laterality: N/A;    Social History   Socioeconomic History  . Marital status: Married    Spouse name: Not on file  . Number of children: Not on file  . Years of education: Not on file  . Highest education level: Not on file  Occupational History  . Not on file  Tobacco Use  . Smoking status: Current Every Day Smoker    Packs/day: 0.25    Years: 5.00    Pack years: 1.25    Types: Cigars  . Smokeless tobacco: Never Used  Vaping Use  . Vaping Use: Never used  Substance and Sexual Activity  . Alcohol use: Yes    Alcohol/week: 16.0 standard drinks    Types: 14 Cans of beer, 2 Shots of liquor per week  . Drug use: No  . Sexual activity: Yes  Other Topics Concern  . Not on file  Social History Narrative   Lives with wife, works at [employer] Strain:   . Difficulty of Paying Living Expenses:   Food Insecurity:   . Worried About Charity fundraiser in the Last Year:   . Arboriculturist in the Last Year:   Transportation Needs:   . Film/video editor (Medical):   [name] Lack of Transportation (Non-Medical):   Physical Activity:   . Days of Exercise  per Week:   . Minutes of Exercise per Session:   Stress:   . Feeling of Stress :   Social Connections:   . Frequency of Communication with Friends and Family:   . Frequency of Social Gatherings with Friends and Family:   . Attends Religious Services:   . Active Member of Clubs or Organizations:   . Attends Archivist Meetings:   [name] Marital Status:   Intimate Partner Violence:   . Fear of Current or Ex-Partner:   . Emotionally Abused:   [name] Physically Abused:   . Sexually Abused:     Family History  Problem Relation Age of Onset  . CAD Brother   . Dementia Mother   . Renal Disease Father    Allergies  Allergen Reactions  . Ferrous Gluconate Nausea And Vomiting  . Other   HYDROcodone-acetaminophen (NORCO)  5-325 mg tablet Take by mouth  . amLODIPine (NORVASC) 5 MG tablet Take 1 tablet (5 mg total) by mouth once daily 30 tablet 0  . aspirin 81 MG EC tablet Take 81 mg by mouth once daily  . b complex multivitamin (RENA-VITE) 0.8 mg Take 1 tablet by mouth nightly for 180 days 90 tablet 1  . doxycycline (VIBRA-TABS) 100 MG tablet Take 100 mg by mouth 2 (two) times daily  . folic acid (FOLVITE) 1 MG tablet TAKE 1 TABLET(1 MG) BY MOUTH EVERY DAY 90 tablet 1  . hydrALAZINE (APRESOLINE) 25 MG tablet Take 1 tablet (25 mg total) by mouth 2 (two) times daily for 180 days 180 tablet 1  . insulin GLARGINE (LANTUS SOLOSTAR U-100 INSULIN) pen injector (concentration 100 units/mL) Inject 10 Units subcutaneously once daily 15 mL 0  . insulin LISPRO (HUMALOG KWIKPEN) pen injector (concentration 100 units/mL) Take only before meals. Check blood sugar, give insulin then eat. If sugar 150 - 200 then give 2 units. If sugar 201-300 give 4 units. If sugar over 301, give 6 units. 15 mL 0  . melatonin 3 mg tablet Take by mouth (Patient not taking: Reported on 11/18/2019 )  . metoprolol tartrate (LOPRESSOR) 50 MG tablet Take 1 tablet (50 mg total) by mouth 2 (two) times daily  . pen needle, diabetic 31 gauge x 5/16" needle Use daily with insulin as directed. diagnosis code: E11.9 (Patient not taking: Reported on 09/23/2019 ) 100 each 5  . sacubitriL-valsartan (ENTRESTO) 24-26 mg tablet Take 1 tablet by mouth 2 (two) times daily 60 tablet 3  . sertraline (ZOLOFT) 100 MG tablet Take 1 tablet (100 mg total) by mouth once daily 90 tablet 1  . thiamine (VITAMIN B-1) 100 MG tablet Take 1 tablet (100 mg total) by mouth once daily for 180 days 90 tablet 1  ? Current Facility-Administered Medications  Medication Dose Route Frequency Provider Last Rate Last Admin  . 0.9 %  sodium chloride infusion  250 mL Intravenous PRN Bonnell Public Tublu, MD      . acetaminophen (TYLENOL) tablet 650 mg  650 mg Oral Q6H PRN Vashti Hey, MD   650 mg at 12/07/19 0247  . amLODipine (NORVASC) tablet 5 mg  5 mg Oral Q0600 Bonnell Public Tublu, MD   5 mg at 12/09/19 0540  . aspirin EC tablet 81 mg  81 mg Oral Daily Bonnell Public Tublu, MD   81 mg at 12/09/19 1884  . carvedilol (COREG) tablet 6.25 mg  6.25 mg Oral BID WC Bonnell Public Tublu, MD   6.25 mg at 12/09/19 0936  .  Chlorhexidine Gluconate Cloth 2 % PADS 6 each  6 each Topical Daily Mariel Aloe, MD   6 each at 12/08/19 1450  . [START ON 12/10/2019] epoetin alfa (EPOGEN) injection 4,000 Units  4,000 Units Intravenous Q T,Th,Sa-HD Singh, Harmeet, MD      . feeding supplement (NEPRO CARB STEADY) liquid 237 mL  237 mL Oral TID BM Bonnell Public Tublu, MD 0 mL/hr at 12/07/19 0259 237 mL at 12/09/19 1415  . heparin injection 5,000 Units  5,000 Units Subcutaneous Q8H Mariel Aloe, MD   5,000 Units at 12/09/19 1415  . hydrALAZINE (APRESOLINE) tablet 25 mg  25 mg Oral Q8H Bonnell Public Tublu, MD   25 mg at 12/09/19 1415  . insulin aspart (novoLOG) injection 0-5 Units  0-5 Units Subcutaneous QHS Lang Snow, NP   3 Units at 12/08/19 2221  . insulin aspart (novoLOG) injection 0-6 Units  0-6 Units Subcutaneous TID WC Vashti Hey, MD   3 Units at 12/09/19 1148  . insulin glargine (LANTUS) injection 10 Units  10 Units Subcutaneous Daily Georgette Shell, MD      . MEDLINE mouth rinse  15 mL Mouth Rinse BID Mariel Aloe, MD   15 mL at 12/09/19 0938  . melatonin tablet 2.5 mg  2.5 mg Oral QHS Bonnell Public Tublu, MD   2.5 mg at 12/08/19 2221  . multivitamin (RENA-VIT) tablet 1 tablet  1 tablet Oral QHS Mariel Aloe, MD   1 tablet at 12/08/19 2221  . pantoprazole (PROTONIX) EC tablet 40 mg  40 mg Oral Daily Bonnell Public Tublu, MD   40 mg at 12/09/19 0937  . sacubitril-valsartan (ENTRESTO) 24-26 mg per tablet  1 tablet Oral BID Vashti Hey, MD   1 tablet at 12/09/19 8315  . sertraline (ZOLOFT)  tablet 100 mg  100 mg Oral Daily Bonnell Public Tublu, MD   100 mg at 12/09/19 0944  . sodium chloride flush (NS) 0.9 % injection 3 mL  3 mL Intravenous Q12H Bonnell Public Tublu, MD   3 mL at 12/09/19 0938  . sodium chloride flush (NS) 0.9 % injection 3 mL  3 mL Intravenous PRN Jamse Arn Kyra Searles, MD         Abtx:  Anti-infectives (From admission, onward)   Start     Dose/Rate Route Frequency Ordered Stop   12/06/19 1530  cefTRIAXone (ROCEPHIN) 2 g in sodium chloride 0.9 % 100 mL IVPB        2 g 200 mL/hr over 30 Minutes Intravenous  Once 12/06/19 1515 12/06/19 1633   12/06/19 1530  azithromycin (ZITHROMAX) 500 mg in sodium chloride 0.9 % 250 mL IVPB        500 mg 250 mL/hr over 60 Minutes Intravenous  Once 12/06/19 1515 12/06/19 1806      REVIEW OF SYSTEMS:  Const: negative fever, negative chills, negative weight loss Eyes: negative diplopia or visual changes, negative eye pain ENT: negative coryza, negative sore throat Resp: negative cough, hemoptysis, had dyspnea Cards: negative for chest pain, palpitations, lower extremity edema GU: negative for frequency, dysuria and hematuria GI: Negative for abdominal pain, diarrhea, bleeding, constipation Skin: negative for rash and pruritus Heme: negative for easy bruising and gum/nose bleeding MS: general weakness Neurolo:mental status back to normal negative for headaches, dizziness, vertigo, memory problems  Psych: negative for feelings of anxiety, depression  Endocrine:  diabetes Allergy/Immunology- as above Objective:  VITALS:  BP (!) 145/75 (BP Location: Left Arm)  Pulse 91   Temp 97.8 F (36.6 C)   Resp 15   Ht 5\' 11"  (1.803 m)   Wt 57.3 kg   SpO2 94%   BMI 17.63 kg/m  PHYSICAL EXAM:  General: awake, chronically ill, no distress, Head: Normocephalic, without obvious abnormality, atraumatic. Eyes: Conjunctivae clear, anicteric sclerae. Pupils are equal ENT Nares normal. No drainage or sinus  tenderness. Lips, mucosa, and tongue normal. No Thrush Neck: Supple, symmetrical, no adenopathy, thyroid: non tender no carotid bruit and no JVD. Back: No CVA tenderness. Lungs:b/l air entry- few basal crepts Heart: s1s2 Abdomen: Soft,  Rt dialysis cath on the chest wall Rt upper extremity AV fistula  Extremities: left 2nd toe - dark, dry gangrene   No obvious discharge  Skin: No rashes or lesions. Or bruising Lymph: Cervical, supraclavicular normal. Neurologic: rt sided weakness Pertinent Labs Lab Results CBC    Component Value Date/Time   WBC 5.3 12/09/2019 0535   RBC 2.65 (L) 12/09/2019 0535   HGB 8.4 (L) 12/09/2019 0535   HGB 13.1 02/12/2014 0801   HCT 25.2 (L) 12/09/2019 0535   HCT 41.7 02/12/2014 0801   PLT 154 12/09/2019 0535   PLT 322 02/12/2014 0801   MCV 95.1 12/09/2019 0535   MCV 95 02/12/2014 0801   MCH 31.7 12/09/2019 0535   MCHC 33.3 12/09/2019 0535   RDW 15.7 (H) 12/09/2019 0535   RDW 16.8 (H) 02/12/2014 0801   LYMPHSABS 1.2 11/25/2019 1003   LYMPHSABS 1.6 02/12/2014 0801   MONOABS 0.3 11/25/2019 1003   MONOABS 0.7 02/12/2014 0801   EOSABS 0.1 11/25/2019 1003   EOSABS 0.1 02/12/2014 0801   BASOSABS 0.0 11/25/2019 1003   BASOSABS 0.1 02/12/2014 0801    CMP Latest Ref Rng & Units 12/07/2019 12/06/2019 11/25/2019  Glucose 70 - 99 mg/dL 288(H) 53(L) 351(H)  BUN 6 - 20 mg/dL 26(H) 46(H) 37(H)  Creatinine 0.61 - 1.24 mg/dL 2.62(H) 3.85(H) 4.01(H)  Sodium 135 - 145 mmol/L 141 139 137  Potassium 3.5 - 5.1 mmol/L 3.1(L) 3.8 3.0(L)  Chloride 98 - 111 mmol/L 101 97(L) 99  CO2 22 - 32 mmol/L 29 30 26   Calcium 8.9 - 10.3 mg/dL 7.8(L) 8.9 8.2(L)  Total Protein 6.5 - 8.1 g/dL - 7.4 -  Total Bilirubin 0.3 - 1.2 mg/dL - 0.7 -  Alkaline Phos 38 - 126 U/L - 294(H) -  AST 15 - 41 U/L - 43(H) -  ALT 0 - 44 U/L - 38 -      Microbiology: Recent Results (from the past 240 hour(s))  Culture, blood (routine x 2)     Status: None (Preliminary result)   Collection  Time: 12/06/19  1:54 PM   Specimen: BLOOD  Result Value Ref Range Status   Specimen Description BLOOD LAC  Final   Special Requests   Final    BOTTLES DRAWN AEROBIC AND ANAEROBIC Blood Culture results may not be optimal due to an excessive volume of blood received in culture bottles   Culture   Final    NO GROWTH 3 DAYS Performed at [facility], [address] [place], [place] [address]    Report Status PENDING  Incomplete  Culture, blood (routine x 2)     Status: None (Preliminary result)   Collection Time: 12/06/19  1:54 PM   Specimen: BLOOD  Result Value Ref Range Status   Specimen Description BLOOD L BICEP  Final   Special Requests   Final    BOTTLES DRAWN AEROBIC AND  ANAEROBIC Blood Culture results may not be optimal due to an excessive volume of blood received in culture bottles   Culture   Final    NO GROWTH 3 DAYS Performed at [facility], [address] [place], [place] [address]    Report Status PENDING  Incomplete  SARS Coronavirus 2 by RT PCR (hospital order, performed in [facility] hospital lab) Nasopharyngeal Nasopharyngeal Swab     Status: None   Collection Time: 12/06/19  1:54 PM   Specimen: Nasopharyngeal Swab  Result Value Ref Range Status   SARS Coronavirus 2 NEGATIVE NEGATIVE Final    Comment: (NOTE) SARS-CoV-2 target nucleic acids are NOT DETECTED.  The SARS-CoV-2 RNA is generally detectable in upper and lower respiratory specimens during the acute phase of infection. The lowest concentration of SARS-CoV-2 viral copies this assay can detect is 250 copies / mL. A negative result does not preclude SARS-CoV-2 infection and should not be used as the sole basis for treatment or other patient management decisions.  A negative result may occur with improper specimen collection / handling, submission of specimen other than nasopharyngeal swab, presence of viral mutation(s) within the areas targeted by this assay, and inadequate number of  viral copies (<250 copies / mL). A negative result must be combined with clinical observations, patient history, and epidemiological information.  Fact Sheet for Patients:   StrictlyIdeas.no  Fact Sheet for Healthcare Providers: BankingDealers.co.za  This test is not yet approved or  cleared by the [place] FDA and has been authorized for detection and/or diagnosis of SARS-CoV-2 by FDA under an Emergency Use Authorization (EUA).  This EUA will remain in effect (meaning this test can be used) for the duration of the COVID-19 declaration under Section 564(b)(1) of the Act, 21 U.S.C. section 360bbb-3(b)(1), unless the authorization is terminated or revoked sooner.  Performed at [facility], [address] [place], [place] [address]     IMAGING RESULTS:  I have personally reviewed the films ?Enlarging, incompletely visualized fluid collection in the left upper quadrant of the abdomen, measuring up to 8.0 x 5.2 cm on image 94/4. This lies between the stomach and spleen and has enlarged compared with previous abdominal CT where there was a much smaller collection adjacent to a calcified pancreatic tail. Appearance suggests a pancreatic pseudocyst ECHo EF 45-50% MRI foot Area of ulceration over the distal second digit with findings of acute osteomyelitis involving the distal phalanx. No loculated fluid collections or sinus tract. 2. Reactive marrow edema within the middle phalanx of the second digit and midfoot.   Impression/Recommendation ?  Encephalopathy due to hypoglycemia resolved Was the hypoglycemia due to insulin and poor intake or does he have an underlying infection Blood culture neg CXR b/l Pleural effusion but no pneumonia Upper abdomen demonstrated a fluid collection between spleen and stomach- suspicion for pseudo cyst recommend  a dedicated abdomen imaging  DM on insulin   Gangrene of th 2 nd toe  with full thickness ulceration Because of increased procal and above findings will start pip-tazo Plan for amputation awaiting angio  ESRD -on dialysis by catheter Has AV fistula -not being used yet  HTN  Pulmonary edema -resolved management as per primary team  H/o CVA- residual weakness rt side ? ?Dr.Fitzgerald will follow him the next 2 days ___________________________________________________ Discussed with patient, requesting provider Note:  This document was prepared using Dragon voice recognition software and may include unintentional dictation errors.

## 2019-12-09 NOTE — Progress Notes (Addendum)
PROGRESS NOTE    [name]  OHY:[id number] DOB: 28-Dec-1960 DOA: 12/06/2019 PCP: [name], MD   Brief Narrative: [name] is a 59 y.o. male with a history of uncontrolled DM, poorly controlled HTN, ESRD on HD, previous alcohol use no use x1 year. Patient presented secondary to altered mental status and found to be hypoglycemic and severely fluid overloaded requiring emergent HD.   Assessment & Plan:   Principal Problem:   Acute respiratory failure with hypoxia (HCC) Active Problems:   Uncontrolled diabetes mellitus (HCC)   Alcohol abuse   HTN (hypertension)   CVA (cerebral vascular accident) ([place])   Chronic osteomyelitis of toe of left foot (HCC)   Diabetic foot infection ([place])   Acute pulmonary edema (HCC)   Acute respiratory failure with hypoxia Secondary to acute pulmonary edema from missed HD. Patient received emergent HD on the night of admission with improvement of symptoms and hypoxia. Patient also received lasix IV -Wean oxygen to room air as able -HD per nephrology  Acute pulmonary edema Secondary to fluid overload. Improved with HD. -Nephrology recommendations for HD  ESRD on HD -HD per nephrology  Diabetic foot infection Per patient, he was followed as an outpatient for management of this foot infection for the last month. He was previously on antibiotics for which he has completed. Left second digit with purulent drainage and concern for possible osteomyelitis. Feet also with poor pulses. Procalcitonin rising; no leukocytosis or fevers. Blood cultures with no growth to date. X-ray concerning for osteomyelitis -MRI of left foot-area of ulceration over the distal second digit with findings of acute osteomyelitis involving the distal phalanx.  Reactive marrow edema within the middle phalanx of the second digit in the midfoot. -ABI of bilateral feet -nondiagnostic ABI secondary to incompressible vascular calcifications.  Decreased toe brachial indicis  consistent with at least moderate bilateral arterial occlusive disease at rest.  Patient with no fever or leukocytosis we will hold off on antibiotics for now. Await input from vascular surgery and infectious disease.  Wheezing Given Duonebs. Resolved.  Essential hypertension Patient is on amlodipine, Coreg, Entresto, Hydralazine  Hypoglycemia Patient with a blood sugar of 21 on admission. CBG (last 3)  Recent Labs    12/08/19 1545 12/08/19 2201 12/09/19 0802  GLUCAP 277* 376* 149*     Diabetes mellitus, type 2 Patient is on Humalog TID with meals, Lantus 20 units daily as an outpatient. Lantus held secondary to hypoglycemia, restart Lantus. -Continue SSI  Acute metabolic encephalopathy Secondary to hypoglycemia. Resolved.  Depression -Continue sertraline  Malnutrition Unknown severity -Continue nutrition supplements  [name] Pancreatic pseudocyst -Outpatient follow-up  DVT prophylaxis: heparin subq Code Status:   Code Status: Full Code Family Communication: None at bedside. Wife on telephone Disposition Plan: Unknown patient now has osteomyelitis of the left second toe waiting on ID and vascular surgery evaluation.   Consultants:   Nephrology, vascular surgery  Procedures:   Hemodialysis  Antimicrobials:  None    Subjective: He is resting in bed asking when he can go home discussed with wife  Objective: Vitals:   12/09/19 0357 12/09/19 0500 12/09/19 0546 12/09/19 0801  BP: (!) 130/81  (!) 151/75 (!) 151/80  Pulse: 90   97  Resp: 18  15 17   Temp: 98.6 F (37 C)   97.6 F (36.4 C)  TempSrc: Oral     SpO2: (!) 89% 94% 94% 100%  Weight:      Height:  Intake/Output Summary (Last 24 hours) at 12/09/2019 1045 Last data filed at 12/08/2019 1906 Gross per 24 hour  Intake 240 ml  Output 1500 ml  Net -1260 ml   Filed Weights   12/06/19 1241 12/07/19 1133  Weight: 72.6 kg 57.3 kg    Examination:  General exam: Appears  calm and comfortable Respiratory system: Diminished. Respiratory effort normal. Cardiovascular system: S1 & S2 heard, RRR. No murmurs, rubs, gallops or clicks. Right arm AV graft with palpable thrill and audible bruit Gastrointestinal system: Abdomen is nondistended, soft and nontender. No organomegaly or masses felt. Normal bowel sounds heard. Central nervous system: Alert and oriented. No focal neurological deficits. Musculoskeletal: Toe covered with dressing Skin: No cyanosis. No rashes Psychiatry: Judgement and insight appear normal. Mood & affect appropriate.     Data Reviewed: I have personally reviewed following labs and imaging studies  CBC Lab Results  Component Value Date   WBC 5.3 12/09/2019   RBC 2.65 (L) 12/09/2019   HGB 8.4 (L) 12/09/2019   HCT 25.2 (L) 12/09/2019   MCV 95.1 12/09/2019   MCH 31.7 12/09/2019   PLT 154 12/09/2019   MCHC 33.3 12/09/2019   RDW 15.7 (H) 12/09/2019   LYMPHSABS 1.2 11/25/2019   MONOABS 0.3 11/25/2019   EOSABS 0.1 11/25/2019   BASOSABS 0.0 22/48/2500     Last metabolic panel Lab Results  Component Value Date   NA 141 12/07/2019   K 3.1 (L) 12/07/2019   CL 101 12/07/2019   CO2 29 12/07/2019   BUN 26 (H) 12/07/2019   CREATININE 2.62 (H) 12/07/2019   GLUCOSE 288 (H) 12/07/2019   GFRNONAA 25 (L) 12/07/2019   GFRAA 29 (L) 12/07/2019   CALCIUM 7.8 (L) 12/07/2019   PHOS 3.4 08/25/2019   PROT 7.4 12/06/2019   ALBUMIN 3.4 (L) 12/06/2019   LABGLOB 2.7 07/26/2018   AGRATIO 0.9 07/26/2018   BILITOT 0.7 12/06/2019   ALKPHOS 294 (H) 12/06/2019   AST 43 (H) 12/06/2019   ALT 38 12/06/2019   ANIONGAP 11 12/07/2019    CBG (last 3)  Recent Labs    12/08/19 1545 12/08/19 2201 12/09/19 0802  GLUCAP 277* 376* 149*     GFR: Estimated Creatinine Clearance: 24.3 mL/min (A) (by C-G formula based on SCr of 2.62 mg/dL (H)).  Coagulation Profile: No results for input(s): INR, PROTIME in the last 168 hours.  Recent Results (from the  past 240 hour(s))  Culture, blood (routine x 2)     Status: None (Preliminary result)   Collection Time: 12/06/19  1:54 PM   Specimen: BLOOD  Result Value Ref Range Status   Specimen Description BLOOD LAC  Final   Special Requests   Final    BOTTLES DRAWN AEROBIC AND ANAEROBIC Blood Culture results may not be optimal due to an excessive volume of blood received in culture bottles   Culture   Final    NO GROWTH 3 DAYS Performed at [facility], [address] [place], [place] [address]    Report Status PENDING  Incomplete  Culture, blood (routine x 2)     Status: None (Preliminary result)   Collection Time: 12/06/19  1:54 PM   Specimen: BLOOD  Result Value Ref Range Status   Specimen Description BLOOD L BICEP  Final   Special Requests   Final    BOTTLES DRAWN AEROBIC AND ANAEROBIC Blood Culture results may not be optimal due to an excessive volume of blood received in culture bottles   Culture  Final    NO GROWTH 3 DAYS Performed at [facility], [address] [place], [place] [address]    Report Status PENDING  Incomplete  SARS Coronavirus 2 by RT PCR (hospital order, performed in [facility] hospital lab) Nasopharyngeal Nasopharyngeal Swab     Status: None   Collection Time: 12/06/19  1:54 PM   Specimen: Nasopharyngeal Swab  Result Value Ref Range Status   SARS Coronavirus 2 NEGATIVE NEGATIVE Final    Comment: (NOTE) SARS-CoV-2 target nucleic acids are NOT DETECTED.  The SARS-CoV-2 RNA is generally detectable in upper and lower respiratory specimens during the acute phase of infection. The lowest concentration of SARS-CoV-2 viral copies this assay can detect is 250 copies / mL. A negative result does not preclude SARS-CoV-2 infection and should not be used as the sole basis for treatment or other patient management decisions.  A negative result may occur with improper specimen collection / handling, submission of specimen other than nasopharyngeal  swab, presence of viral mutation(s) within the areas targeted by this assay, and inadequate number of viral copies (<250 copies / mL). A negative result must be combined with clinical observations, patient history, and epidemiological information.  Fact Sheet for Patients:   StrictlyIdeas.no  Fact Sheet for Healthcare Providers: BankingDealers.co.za  This test is not yet approved or  cleared by the [place] FDA and has been authorized for detection and/or diagnosis of SARS-CoV-2 by FDA under an Emergency Use Authorization (EUA).  This EUA will remain in effect (meaning this test can be used) for the duration of the COVID-19 declaration under Section 564(b)(1) of the Act, 21 U.S.C. section 360bbb-3(b)(1), unless the authorization is terminated or revoked sooner.  Performed at [facility], [address] [place], [place] [address]         Radiology Studies: MR FOOT LEFT WO CONTRAST  Result Date: 12/08/2019 CLINICAL DATA:  Question of osteomyelitis of the second toe diabetic foot infection EXAM: MRI OF THE LEFT FOOT WITHOUT CONTRAST TECHNIQUE: Multiplanar, multisequence MR imaging of the left was performed. No intravenous contrast was administered. COMPARISON:  December 07, 2019 radiograph FINDINGS: Bones/Joint/Cartilage There is cortical irregularity with erosive type changes seen through the distal second phalanx. There is T2 hyperintense signal with T1 hypointensity seen throughout the distal phalanx. There is increased T2 hyperintense signal seen within the middle phalanx of the second digit without T1 hypointensity or cortical irregularity. Erosive type change or prior osteotomies are seen at the fourth and third MTP joints. There is also mildly increased signal seen within the metatarsals and midfoot without associated T1 hypointensity. Ligaments The Lisfranc ligaments appear to be intact. Muscles and Tendons Fatty atrophy  with increased signal seen throughout the muscles of the forefoot. The flexor and extensor tendons appear to be grossly intact. Soft tissues Area of ulceration seen overlying distal second phalanx with diffuse soft tissue edema and skin thickening. No loculated fluid collection or sinus tract is seen. There is mild dorsal soft tissue swelling seen over the forefoot. IMPRESSION: 1. Area of ulceration over the distal second digit with findings of acute osteomyelitis involving the distal phalanx. No loculated fluid collections or sinus tract. 2. Reactive marrow edema within the middle phalanx of the second digit and midfoot. Electronically Signed   By: Prudencio Pair M.D.   On: 12/08/2019 21:54   US ARTERIAL ABI (SCREENING LOWER EXTREMITY)  Result Date: 12/08/2019 CLINICAL DATA:  Left second toe infection. Diabetes, hypertension, hyperlipidemia, renal failure, history of tobacco abuse  EXAM: NONINVASIVE PHYSIOLOGIC VASCULAR STUDY OF BILATERAL LOWER EXTREMITIES TECHNIQUE: Evaluation of both lower extremities were performed at rest, including calculation of ankle-brachial indices with single level Doppler, pressure and pulse volume recording. COMPARISON:  None available FINDINGS: Right ABI: Non calculable due to vascular noncompressibility. Toe brachial index 0.5 Left ABI: Non calculable due to vascular noncompressibility. Toe brachial index 0.45 Right Lower Extremity:  Biphasic distal arterial waveforms Left Lower Extremity:  Biphasic distal arterial waveforms IMPRESSION: ABIs are non diagnostic secondary to incompressible vessel calcifications (medial arterial sclerosis of [place]). Decreased toe brachial indices consistent with at least moderate bilateral arterial occlusive disease at rest. Electronically Signed   By: Lucrezia Europe M.D.   On: 12/08/2019 13:13   DG Foot Complete Left  Result Date: 12/07/2019 CLINICAL DATA:  59 year old male with diabetic foot infection. EXAM: LEFT FOOT - COMPLETE 3+ VIEW  COMPARISON:  Left ankle radiograph dated 11/09/2019. FINDINGS: There is no acute fracture or dislocation. The bones are osteopenic. Erosive changes of the middle and distal phalanx of the second toe, progressed since the prior radiograph and concerning for osteomyelitis. Clinical correlation is recommended. There are erosive changes versus osteotomy of the third and fourth MTP joints similar to prior radiograph. There is soft tissue swelling and small pockets of soft tissue air in the distal second toe in keeping with ulcer. Vascular calcifications noted. IMPRESSION: 1. No acute fracture or dislocation. 2. Ulceration of the distal aspect of the second toe with findings concerning for osteomyelitis of the middle and distal phalanges of the second toe. MRI or a WBC nuclear scan may provide better evaluation. Electronically Signed   By: Anner Crete M.D.   On: 12/07/2019 19:05   DG Foot Complete Right  Result Date: 12/07/2019 CLINICAL DATA:  Bilateral diabetic foot infection. Bilateral foot abscess. EXAM: RIGHT FOOT COMPLETE - 3+ VIEW COMPARISON:  Remote right foot exam 03/01/2018 FINDINGS: No bony destruction, periosteal reaction, or abnormal bone density to suggest osteomyelitis. No visualized large soft tissue defects or soft tissue air. Osteoarthritis of the first metatarsal phalangeal joint again seen, slight progression from 2019. slight hammertoe deformity of the fourth digit. Alignment otherwise maintained. There are vascular calcifications. IMPRESSION: 1. No radiographic evidence of osteomyelitis. No visualized large soft tissue defects/ulcer or soft tissue air. 2. Osteoarthritis of the first metatarsophalangeal joint, slightly progressed from 2019. 3. Vascular calcifications. Electronically Signed   By: Keith Rake M.D.   On: 12/07/2019 19:01   ECHOCARDIOGRAM COMPLETE  Result Date: 12/07/2019    ECHOCARDIOGRAM REPORT   Patient Name:   [name] Date of Exam: 12/07/2019 Medical Rec #:   [id number]     Height:       71.0 in Accession #:    [id number]    Weight:       126.4 lb Date of Birth:  15-Jun-1960     BSA:          1.735 m Patient Age:    21 years      BP:           136/75 mmHg Patient Gender: M             HR:           85 bpm. Exam Location:  [facility] Procedure: 2D Echo, Color Doppler and Cardiac Doppler Indications:     CHF 428.0  History:         Patient has no prior history of Echocardiogram examinations.  Stroke; Risk Factors:Hypertension, Diabetes and Dyslipidemia.                  CKD, ETOH abuse.  Sonographer:     Sherrie Sport RDCS (AE) Referring Phys:  [id number] [place] Diagnosing Phys: Isaias Cowman MD IMPRESSIONS  1. Left ventricular ejection fraction, by estimation, is 45 to 50%. The left ventricle has mildly decreased function. The left ventricle has no regional wall motion abnormalities. Left ventricular diastolic parameters were normal.  2. Right ventricular systolic function is normal. The right ventricular size is normal. There is severely elevated pulmonary artery systolic pressure.  3. Left atrial size was moderately dilated.  4. The mitral valve is normal in structure. Moderate to severe mitral valve regurgitation. No evidence of mitral stenosis.  5. Tricuspid valve regurgitation is moderate.  6. The aortic valve is normal in structure. Aortic valve regurgitation is mild. No aortic stenosis is present.  7. The inferior vena cava is normal in size with greater than 50% respiratory variability, suggesting right atrial pressure of 3 mmHg. FINDINGS  Left Ventricle: Left ventricular ejection fraction, by estimation, is 45 to 50%. The left ventricle has mildly decreased function. The left ventricle has no regional wall motion abnormalities. The left ventricular internal cavity size was normal in size. There is no left ventricular hypertrophy. Left ventricular diastolic parameters were normal. Right Ventricle: The right ventricular size is normal. No  increase in right ventricular wall thickness. Right ventricular systolic function is normal. There is severely elevated pulmonary artery systolic pressure. The tricuspid regurgitant velocity is 3.56 m/s, and with an assumed right atrial pressure of 10 mmHg, the estimated right ventricular systolic pressure is 09.6 mmHg. Left Atrium: Left atrial size was moderately dilated. Right Atrium: Right atrial size was normal in size. Pericardium: There is no evidence of pericardial effusion. Mitral Valve: The mitral valve is normal in structure. Normal mobility of the mitral valve leaflets. Moderate to severe mitral valve regurgitation. No evidence of mitral valve stenosis. Tricuspid Valve: The tricuspid valve is normal in structure. Tricuspid valve regurgitation is moderate . No evidence of tricuspid stenosis. Aortic Valve: The aortic valve is normal in structure. Aortic valve regurgitation is mild. No aortic stenosis is present. Aortic valve mean gradient measures 4.0 mmHg. Aortic valve peak gradient measures 6.8 mmHg. Aortic valve area, by VTI measures 2.49 cm. Pulmonic Valve: The pulmonic valve was normal in structure. Pulmonic valve regurgitation is not visualized. No evidence of pulmonic stenosis. Aorta: The aortic root is normal in size and structure. Venous: The inferior vena cava is normal in size with greater than 50% respiratory variability, suggesting right atrial pressure of 3 mmHg. IAS/Shunts: No atrial level shunt detected by color flow Doppler.  LEFT VENTRICLE PLAX 2D LVIDd:         5.30 cm      Diastology LVIDs:         4.03 cm      LV e' lateral:   10.10 cm/s LV PW:         1.13 cm      LV E/e' lateral: 10.1 LV IVS:        1.07 cm      LV e' medial:    6.74 cm/s LVOT diam:     2.10 cm      LV E/e' medial:  15.1 LV SV:         58 LV SV Index:   33 LVOT Area:     3.46 cm  LV Volumes (MOD) LV vol d, MOD A2C: 111.0 ml LV vol d, MOD A4C: 129.0 ml LV vol s, MOD A2C: 61.4 ml LV vol s, MOD A4C: 60.0 ml LV SV MOD  A2C:     49.6 ml LV SV MOD A4C:     129.0 ml LV SV MOD BP:      59.1 ml RIGHT VENTRICLE RV Basal diam:  3.39 cm RV S prime:     11.30 cm/s TAPSE (M-mode): 4.5 cm LEFT ATRIUM            Index       RIGHT ATRIUM           Index LA diam:      4.50 cm  2.59 cm/m  RA Area:     15.20 cm LA Vol (A2C): 130.0 ml 74.93 ml/m RA Volume:   34.90 ml  20.12 ml/m LA Vol (A4C): 38.6 ml  22.25 ml/m  AORTIC VALVE                   PULMONIC VALVE AV Area (Vmax):    1.92 cm    PV Vmax:        0.47 m/s AV Area (Vmean):   2.04 cm    PV Peak grad:   0.9 mmHg AV Area (VTI):     2.49 cm    RVOT Peak grad: 3 mmHg AV Vmax:           130.50 cm/s AV Vmean:          92.850 cm/s AV VTI:            0.232 m AV Peak Grad:      6.8 mmHg AV Mean Grad:      4.0 mmHg LVOT Vmax:         72.50 cm/s LVOT Vmean:        54.800 cm/s LVOT VTI:          0.167 m LVOT/AV VTI ratio: 0.72  AORTA Ao Root diam: 2.60 cm MITRAL VALVE                TRICUSPID VALVE MV Area (PHT): 5.34 cm     TR Peak grad:   50.7 mmHg MV Decel Time: 142 msec     TR Vmax:        356.00 cm/s MV E velocity: 102.00 cm/s MV A velocity: 41.10 cm/s   SHUNTS MV E/A ratio:  2.48         Systemic VTI:  0.17 m                             Systemic Diam: 2.10 cm Isaias Cowman MD Electronically signed by Isaias Cowman MD Signature Date/Time: 12/07/2019/4:48:11 PM    Final         Scheduled Meds: . amLODipine  5 mg Oral Q0600  . aspirin EC  81 mg Oral Daily  . carvedilol  6.25 mg Oral BID WC  . Chlorhexidine Gluconate Cloth  6 each Topical Daily  . [START ON 12/10/2019] epoetin (EPOGEN/PROCRIT) injection  4,000 Units Intravenous Q T,Th,Sa-HD  . feeding supplement (NEPRO CARB STEADY)  237 mL Oral TID BM  . heparin  5,000 Units Subcutaneous Q8H  . hydrALAZINE  25 mg Oral Q8H  . insulin aspart  0-5 Units Subcutaneous QHS  . insulin aspart  0-6 Units Subcutaneous TID WC  . insulin glargine  5 Units Subcutaneous  Daily  . mouth rinse  15 mL Mouth Rinse BID  . melatonin   2.5 mg Oral QHS  . multivitamin  1 tablet Oral QHS  . pantoprazole  40 mg Oral Daily  . sacubitril-valsartan  1 tablet Oral BID  . sertraline  100 mg Oral Daily  . sodium chloride flush  3 mL Intravenous Q12H   Continuous Infusions: . sodium chloride       LOS: 3 days   Landis Gandy MD [facility] 12/09/2019, 10:45 AM  If 7PM-7AM, please contact night-coverage [email or web address]

## 2019-12-09 NOTE — TOC Initial Note (Signed)
Transition of Care [facility]) - Initial/Assessment Note    Patient Details  Name: [name] MRN: [id number] Date of Birth: [birth year]  Transition of Care [facility]) CM/SW Contact:    Shelbie Hutching, RN Phone Number: 12/09/2019, 11:17 AM  Clinical Narrative:                 Patient admitted to the hospital with acute respiratory failure with hypoxia.  Patient has ESRD and is on hemodialysis.  Patient's wife is sitting at the bedside and patient is sleeping.  Wife, [name] reports that patient sleeps a lot at home and mostly stays in the house, he does not drive.  Dialysis is on T,Th, Sat.  [facility] transports patient on Tue and Thursday and his wife carries him on Saturdays.  Patient walks with a walker.  Patient is current with his PCP Dr. Ginette Pitman and uses [facility] in [place] for pharmacy.   Patient's wife would like to have home health services at discharge, referral given to [facility] for RN, PT, OT, and aide.  Floydene Flock will check to see if [facility] can accept the referral.   Expected Discharge Plan: [place] Barriers to Discharge: Continued Medical Work up   Patient Goals and CMS Choice   CMS Medicare.gov Compare Post Acute Care list provided to:: Patient Represenative (must comment) Choice offered to / list presented to : Spouse  Expected Discharge Plan and Services Expected Discharge Plan: [place]   Discharge Planning Services: CM Consult Post Acute Care Choice: [place] arrangements for the past 2 months: Single Family Home                           HH Arranged: RN, PT, OT, Nurse's Aide, Social Work [employer] Agency: [place] ([place]) Date [place]: 12/09/19 Time [place]: 50 Representative spoke with at [place]: Floydene Flock  Prior Living Arrangements/Services Living arrangements for the past 2 months: [place] with:: Spouse Patient language and need for interpreter  reviewed:: Yes Do you feel safe going back to the place where you live?: Yes      Need for Family Participation in Patient Care: Yes (Comment) (dialysis patient, fluid overload) Care giver support system in place?: Yes (comment) (wife and sister) Current home services: DME (walker) Criminal Activity/Legal Involvement Pertinent to Current Situation/Hospitalization: No - Comment as needed  Activities of Daily Living Home Assistive Devices/Equipment: None ADL Screening (condition at time of admission) Patient's cognitive ability adequate to safely complete daily activities?: Yes Is the patient deaf or have difficulty hearing?: No Does the patient have difficulty seeing, even when wearing glasses/contacts?: No Does the patient have difficulty concentrating, remembering, or making decisions?: No Patient able to express need for assistance with ADLs?: Yes Does the patient have difficulty dressing or bathing?: No Independently performs ADLs?: Yes (appropriate for developmental age) Does the patient have difficulty walking or climbing stairs?: Yes Weakness of Legs: Both Weakness of Arms/Hands: None  Permission Sought/Granted Permission sought to share information with : Case Manager, Family Supports, Other (comment) Permission granted to share information with : Yes, Verbal Permission Granted  Share Information with NAME: [name]  Permission granted to share info w AGENCY: [place] granted to share info w Relationship: wife     Emotional Assessment Appearance:: Appears older than stated age Attitude/Demeanor/Rapport: Lethargic Affect (typically observed): Withdrawn Orientation: : Oriented to Self, Oriented  to Place, Oriented to  Time, Oriented to Situation Alcohol / Substance Use: Not Applicable Psych Involvement: No (comment)  Admission diagnosis:  Acute respiratory failure with hypoxia (HCC) [J96.01] ESRD on hemodialysis ([place]) [N18.6, Z99.2] Other hypervolemia  [E87.79] Patient Active Problem List   Diagnosis Date Noted  . Diabetic foot infection ([place]) 12/07/2019  . Acute pulmonary edema ([place]) 12/07/2019  . Paronychia of second toe of left foot 11/09/2019  . Chronic osteomyelitis of toe of left foot ([place]) 11/09/2019  . Uncontrolled type 2 diabetes mellitus with hyperosmolar nonketotic hyperglycemia ([place]) 09/16/2019  . Hypoglycemia associated with diabetes ([place]) 08/24/2019  . Acute respiratory failure with hypoxia ([place])   . Acute metabolic encephalopathy 37/85/8850  . Hypertensive emergency 07/06/2019  . Acute encephalopathy 07/06/2019  . Seizures due to metabolic disorder ([place]) 27/74/1287  . Hyperosmolar hyperglycemic state (HHS) ([place]) 06/05/2019  . Anemia 06/04/2019  . Frequent falls 06/04/2019  . History of CVA (cerebrovascular accident) 06/04/2019  . HCAP (healthcare-associated pneumonia) 06/04/2019  . Acute blood loss anemia 06/04/2019  . Hyperglycemia 05/28/2019  . Fall at home, initial encounter 05/28/2019  . Rib fracture 05/28/2019  . Depression 05/28/2019  . Hypokalemia 05/28/2019  . Weakness 05/27/2019  . ESRD on hemodialysis ([place])   . Acute renal failure (ARF) ([place]) 04/09/2019  . Polyp of ascending colon   . Diarrhea   . AKI (acute kidney injury) ([place]) 03/04/2019  . Acute kidney injury ([place]) 07/26/2018  . ARF (acute renal failure) ([place]) 07/25/2018  . Bilateral leg numbness 03/19/2018  . Numbness and tingling of both feet 03/19/2018  . CVA (cerebral vascular accident) ([place]) 03/17/2018  . Moderate episode of recurrent major depressive disorder ([place]) 03/11/2018  . UTI (urinary tract infection) 02/27/2018  . Hypoglycemia 01/15/2018  . Type 2 diabetes mellitus without complication, with long-term current use of insulin ([place]) 08/27/2017  . Protein-calorie malnutrition, severe 08/19/2017  . Pancreatitis, acute 08/16/2017  . DKA (diabetic ketoacidoses) ([place]) 08/16/2017  . HTN (hypertension) 08/16/2017  . HLD (hyperlipidemia)  08/16/2017  . Carotid stenosis 08/02/2016  . GERD (gastroesophageal reflux disease) 08/02/2016  . History of esophagogastroduodenoscopy (EGD) 07/01/2016  . History of recent blood transfusion 07/01/2016  . Acute renal failure superimposed on stage 4 chronic kidney disease ([place]) 07/01/2016  . Hyponatremia 07/01/2016  . Uncontrolled diabetes mellitus ([place]) 07/01/2016  . Alcohol abuse 07/01/2016  . Monilial esophagitis ([place]) 07/01/2016  . Tobacco abuse 07/01/2016  . Confusion 06/29/2016  . Iron deficiency anemia 06/29/2016  . Coagulopathy ([place]) 06/29/2016   PCP:  [name], MD Pharmacy:   [facility] [id number] - [address], [address] [place] [address] Phone: [phone] Fax: [phone]     Social Determinants of Health (SDOH) Interventions    Readmission Risk Interventions Readmission Risk Prevention Plan 07/07/2019 06/09/2019 06/05/2019  Transportation Screening Complete - Complete  PCP or Specialist Appt within 3-5 Days Complete Complete Complete  HRI or Home Care Consult Complete Complete Complete  Palliative Care Screening - - Not Applicable  Medication Review (RN Care Manager) Complete - Complete  Some recent data might be hidden

## 2019-12-09 NOTE — Progress Notes (Signed)
Inpatient Diabetes Program Recommendations  AACE/ADA: New Consensus Statement on Inpatient Glycemic Control   Target Ranges:  Prepandial:   less than 140 mg/dL      Peak postprandial:   less than 180 mg/dL (1-2 hours)      Critically ill patients:  140 - 180 mg/dL   Results for [name], [name] (MRN [id number]) as of 12/09/2019 12:24  Ref. Range 12/08/2019 08:06 12/08/2019 09:56 12/08/2019 14:03 12/08/2019 15:45 12/08/2019 22:01 12/09/2019 08:02 12/09/2019 11:21  Glucose-Capillary Latest Ref Range: 70 - 99 mg/dL 113 (H) 208 (H) 113 (H) 277 (H) 376 (H) 149 (H) 298 (H)   Review of Glycemic Control  Diabetes history: DM2 Outpatient Diabetes medications: Lantus 20 units daily, Humalog 0-15 units TID with meals Current orders for Inpatient glycemic control: Lantus 10 units daily, Novolog 0-6 units TID with meals, Novolog 0-5 units QHS  Inpatient Diabetes Program Recommendations:   Insulin - Basal: Fasting glucose 149 mg/dl today (113 mg/dl yesterday). Please consider decreasing Lantus to 5 units QHS.  Insulin-meal coverage: Please consider ordering Novolog 2 units TID with meals for meal coverage if patient eats at least 50% of meals.  Outpatient DM medications: Would recommend decreasing Lantus dose outpatient and decreasing Humalog correction to very sensitive scale (0-6 units TID with meals).  Thanks, Barnie Alderman, RN, MSN, CDE Diabetes Coordinator Inpatient Diabetes Program [phone] (Team Pager from 8am to 5pm)

## 2019-12-09 NOTE — Consult Note (Signed)
Reason for Consult: Osteomyelitis left second toe. Referring Physician: Hiran Baker is an 59 y.o. male.  HPI: This is a 59 year old male with a history of osteomyelitis on his left second toe for at least the last month with history of diabetes and peripheral vascular disease as well.  He has been managed outpatient by another podiatry group and presented to our hospital for continued progressive problems with bone infection.  Admitted for vascular work-up as well as amputation of the second toe.  Past Medical History:  Diagnosis Date  . Chronic kidney disease   . Diabetes mellitus without complication ([place])   . ETOH abuse   . GERD (gastroesophageal reflux disease)   . Hyperlipidemia   . Hypertension   . Stroke [facility])     Past Surgical History:  Procedure Laterality Date  . AV FISTULA PLACEMENT Right 11/25/2019   Procedure: INSERTION OF ARTERIOVENOUS (AV) GORE-TEX GRAFT ARM;  Surgeon: Katha Cabal, MD;  Location: [facility] ORS;  Service: Vascular;  Laterality: Right;  . COLONOSCOPY WITH PROPOFOL N/A 03/06/2019   Procedure: COLONOSCOPY WITH PROPOFOL;  Surgeon: Lucilla Lame, MD;  Location: [facility] ENDOSCOPY;  Service: Endoscopy;  Laterality: N/A;  . DIALYSIS/PERMA CATHETER INSERTION N/A 04/10/2019   Procedure: DIALYSIS/PERMA CATHETER INSERTION;  Surgeon: Serafina Mitchell, MD;  Location: [place] CV LAB;  Service: Cardiovascular;  Laterality: N/A;  . ESOPHAGOGASTRODUODENOSCOPY (EGD) WITH PROPOFOL N/A 07/01/2016   Procedure: ESOPHAGOGASTRODUODENOSCOPY (EGD) WITH PROPOFOL;  Surgeon: San Jetty, MD;  Location: [facility] ENDOSCOPY;  Service: General;  Laterality: N/A;    Family History  Problem Relation Age of Onset  . CAD Brother   . Dementia Mother   . Renal Disease Father     Social History:  reports that he has been smoking cigars. He has a 1.25 pack-year smoking history. He has never used smokeless tobacco. He reports current alcohol use of about 16.0 standard drinks of  alcohol per week. He reports that he does not use drugs.  Allergies:  Allergies  Allergen Reactions  . Ferrous Gluconate Nausea And Vomiting  . Other     Medications:  Scheduled: . amLODipine  5 mg Oral Q0600  . aspirin EC  81 mg Oral Daily  . carvedilol  6.25 mg Oral BID WC  . Chlorhexidine Gluconate Cloth  6 each Topical Daily  . [START ON 12/10/2019] epoetin (EPOGEN/PROCRIT) injection  4,000 Units Intravenous Q T,Th,Sa-HD  . feeding supplement (NEPRO CARB STEADY)  237 mL Oral TID BM  . heparin  5,000 Units Subcutaneous Q8H  . hydrALAZINE  25 mg Oral Q8H  . insulin aspart  0-5 Units Subcutaneous QHS  . insulin aspart  0-6 Units Subcutaneous TID WC  . insulin glargine  10 Units Subcutaneous Daily  . mouth rinse  15 mL Mouth Rinse BID  . melatonin  2.5 mg Oral QHS  . multivitamin  1 tablet Oral QHS  . pantoprazole  40 mg Oral Daily  . sacubitril-valsartan  1 tablet Oral BID  . sertraline  100 mg Oral Daily  . sodium chloride flush  3 mL Intravenous Q12H    Results for orders placed or performed during the hospital encounter of 12/06/19 (from the past 48 hour(s))  Glucose, capillary     Status: Abnormal   Collection Time: 12/07/19  6:21 PM  Result Value Ref Range   Glucose-Capillary 306 (H) 70 - 99 mg/dL    Comment: Glucose reference range applies only to samples taken after fasting for at least 8  hours.  Glucose, capillary     Status: Abnormal   Collection Time: 12/07/19  9:17 PM  Result Value Ref Range   Glucose-Capillary 444 (H) 70 - 99 mg/dL    Comment: Glucose reference range applies only to samples taken after fasting for at least 8 hours.  Glucose, capillary     Status: Abnormal   Collection Time: 12/08/19 12:57 AM  Result Value Ref Range   Glucose-Capillary 199 (H) 70 - 99 mg/dL    Comment: Glucose reference range applies only to samples taken after fasting for at least 8 hours.  Procalcitonin     Status: None   Collection Time: 12/08/19  4:17 AM  Result Value  Ref Range   Procalcitonin 14.98 ng/mL    Comment:        Interpretation: PCT >= 10 ng/mL: Important systemic inflammatory response, almost exclusively due to severe bacterial sepsis or septic shock. (NOTE)       Sepsis PCT Algorithm           Lower Respiratory Tract                                      Infection PCT Algorithm    ----------------------------     ----------------------------         PCT < 0.25 ng/mL                PCT < 0.10 ng/mL          Strongly encourage             Strongly discourage   discontinuation of antibiotics    initiation of antibiotics    ----------------------------     -----------------------------       PCT 0.25 - 0.50 ng/mL            PCT 0.10 - 0.25 ng/mL               OR       >80% decrease in PCT            Discourage initiation of                                            antibiotics      Encourage discontinuation           of antibiotics    ----------------------------     -----------------------------         PCT >= 0.50 ng/mL              PCT 0.26 - 0.50 ng/mL                AND       <80% decrease in PCT             Encourage initiation of                                             antibiotics       Encourage continuation           of antibiotics    ----------------------------     -----------------------------        PCT >= 0.50 ng/mL  PCT > 0.50 ng/mL               AND         increase in PCT                  Strongly encourage                                      initiation of antibiotics    Strongly encourage escalation           of antibiotics                                     -----------------------------                                           PCT <= 0.25 ng/mL                                                 OR                                        > 80% decrease in PCT                                      Discontinue / Do not initiate                                             antibiotics  Performed  at [facility], [address] [place], [place] [address]   Glucose, capillary     Status: Abnormal   Collection Time: 12/08/19  8:06 AM  Result Value Ref Range   Glucose-Capillary 113 (H) 70 - 99 mg/dL    Comment: Glucose reference range applies only to samples taken after fasting for at least 8 hours.  Glucose, capillary     Status: Abnormal   Collection Time: 12/08/19  9:56 AM  Result Value Ref Range   Glucose-Capillary 208 (H) 70 - 99 mg/dL    Comment: Glucose reference range applies only to samples taken after fasting for at least 8 hours.  Glucose, capillary     Status: Abnormal   Collection Time: 12/08/19  2:03 PM  Result Value Ref Range   Glucose-Capillary 113 (H) 70 - 99 mg/dL    Comment: Glucose reference range applies only to samples taken after fasting for at least 8 hours.  Glucose, capillary     Status: Abnormal   Collection Time: 12/08/19  3:45 PM  Result Value Ref Range   Glucose-Capillary 277 (H) 70 - 99 mg/dL    Comment: Glucose reference range applies only to samples taken after fasting for at least 8 hours.  Glucose, capillary     Status: Abnormal   Collection Time: 12/08/19 10:01  PM  Result Value Ref Range   Glucose-Capillary 376 (H) 70 - 99 mg/dL    Comment: Glucose reference range applies only to samples taken after fasting for at least 8 hours.  CBC     Status: Abnormal   Collection Time: 12/09/19  5:35 AM  Result Value Ref Range   WBC 5.3 4.0 - 10.5 K/uL   RBC 2.65 (L) 4.22 - 5.81 MIL/uL   Hemoglobin 8.4 (L) 13.0 - 17.0 g/dL   HCT 25.2 (L) 39 - 52 %   MCV 95.1 80.0 - 100.0 fL   MCH 31.7 26.0 - 34.0 pg   MCHC 33.3 30.0 - 36.0 g/dL   RDW 15.7 (H) 11.5 - 15.5 %   Platelets 154 150 - 400 K/uL   nRBC 0.0 0.0 - 0.2 %    Comment: Performed at [facility], [address] [place], [place] [address]  Glucose, capillary     Status: Abnormal   Collection Time: 12/09/19  8:02 AM  Result Value Ref Range   Glucose-Capillary 149 (H) 70 -  99 mg/dL    Comment: Glucose reference range applies only to samples taken after fasting for at least 8 hours.  Glucose, capillary     Status: Abnormal   Collection Time: 12/09/19 11:21 AM  Result Value Ref Range   Glucose-Capillary 298 (H) 70 - 99 mg/dL    Comment: Glucose reference range applies only to samples taken after fasting for at least 8 hours.    MR FOOT LEFT WO CONTRAST  Result Date: 12/08/2019 CLINICAL DATA:  Question of osteomyelitis of the second toe diabetic foot infection EXAM: MRI OF THE LEFT FOOT WITHOUT CONTRAST TECHNIQUE: Multiplanar, multisequence MR imaging of the left was performed. No intravenous contrast was administered. COMPARISON:  December 07, 2019 radiograph FINDINGS: Bones/Joint/Cartilage There is cortical irregularity with erosive type changes seen through the distal second phalanx. There is T2 hyperintense signal with T1 hypointensity seen throughout the distal phalanx. There is increased T2 hyperintense signal seen within the middle phalanx of the second digit without T1 hypointensity or cortical irregularity. Erosive type change or prior osteotomies are seen at the fourth and third MTP joints. There is also mildly increased signal seen within the metatarsals and midfoot without associated T1 hypointensity. Ligaments The Lisfranc ligaments appear to be intact. Muscles and Tendons Fatty atrophy with increased signal seen throughout the muscles of the forefoot. The flexor and extensor tendons appear to be grossly intact. Soft tissues Area of ulceration seen overlying distal second phalanx with diffuse soft tissue edema and skin thickening. No loculated fluid collection or sinus tract is seen. There is mild dorsal soft tissue swelling seen over the forefoot. IMPRESSION: 1. Area of ulceration over the distal second digit with findings of acute osteomyelitis involving the distal phalanx. No loculated fluid collections or sinus tract. 2. Reactive marrow edema within the middle  phalanx of the second digit and midfoot. Electronically Signed   By: Prudencio Pair M.D.   On: 12/08/2019 21:54   US ARTERIAL ABI (SCREENING LOWER EXTREMITY)  Result Date: 12/08/2019 CLINICAL DATA:  Left second toe infection. Diabetes, hypertension, hyperlipidemia, renal failure, history of tobacco abuse EXAM: NONINVASIVE PHYSIOLOGIC VASCULAR STUDY OF BILATERAL LOWER EXTREMITIES TECHNIQUE: Evaluation of both lower extremities were performed at rest, including calculation of ankle-brachial indices with single level Doppler, pressure and pulse volume recording. COMPARISON:  None available FINDINGS: Right ABI: Non calculable due to vascular noncompressibility. Toe brachial index 0.5 Left ABI: Non calculable due to  vascular noncompressibility. Toe brachial index 0.45 Right Lower Extremity:  Biphasic distal arterial waveforms Left Lower Extremity:  Biphasic distal arterial waveforms IMPRESSION: ABIs are non diagnostic secondary to incompressible vessel calcifications (medial arterial sclerosis of [place]cke[place]). Decreased toe brachial indices consistent with at least moderate bilateral arterial occlusive disease at rest. Electronically Signed   By: Lucrezia Europe M.D.   On: 12/08/2019 13:13   DG Foot Complete Left  Result Date: 12/07/2019 CLINICAL DATA:  59 year old male with diabetic foot infection. EXAM: LEFT FOOT - COMPLETE 3+ VIEW COMPARISON:  Left ankle radiograph dated 11/09/2019. FINDINGS: There is no acute fracture or dislocation. The bones are osteopenic. Erosive changes of the middle and distal phalanx of the second toe, progressed since the prior radiograph and concerning for osteomyelitis. Clinical correlation is recommended. There are erosive changes versus osteotomy of the third and fourth MTP joints similar to prior radiograph. There is soft tissue swelling and small pockets of soft tissue air in the distal second toe in keeping with ulcer. Vascular calcifications noted. IMPRESSION: 1. No acute fracture or  dislocation. 2. Ulceration of the distal aspect of the second toe with findings concerning for osteomyelitis of the middle and distal phalanges of the second toe. MRI or a WBC nuclear scan may provide better evaluation. Electronically Signed   By: Anner Crete M.D.   On: 12/07/2019 19:05   DG Foot Complete Right  Result Date: 12/07/2019 CLINICAL DATA:  Bilateral diabetic foot infection. Bilateral foot abscess. EXAM: RIGHT FOOT COMPLETE - 3+ VIEW COMPARISON:  Remote right foot exam 03/01/2018 FINDINGS: No bony destruction, periosteal reaction, or abnormal bone density to suggest osteomyelitis. No visualized large soft tissue defects or soft tissue air. Osteoarthritis of the first metatarsal phalangeal joint again seen, slight progression from 2019. slight hammertoe deformity of the fourth digit. Alignment otherwise maintained. There are vascular calcifications. IMPRESSION: 1. No radiographic evidence of osteomyelitis. No visualized large soft tissue defects/ulcer or soft tissue air. 2. Osteoarthritis of the first metatarsophalangeal joint, slightly progressed from 2019. 3. Vascular calcifications. Electronically Signed   By: Keith Rake M.D.   On: 12/07/2019 19:01   ECHOCARDIOGRAM COMPLETE  Result Date: 12/07/2019    ECHOCARDIOGRAM REPORT   Patient Name:   [name] Date of Exam: 12/07/2019 Medical Rec #:  [id number]     Height:       71.0 in Accession #:    [id number]    Weight:       126.4 lb Date of Birth:  02/16/1961     BSA:          1.735 m Patient Age:    38 years      BP:           136/75 mmHg Patient Gender: M             HR:           85 bpm. Exam Location:  [facility] Procedure: 2D Echo, Color Doppler and Cardiac Doppler Indications:     CHF 428.0  History:         Patient has no prior history of Echocardiogram examinations.                  Stroke; Risk Factors:Hypertension, Diabetes and Dyslipidemia.                  CKD, ETOH abuse.  Sonographer:     Sherrie Sport RDCS (AE) Referring Phys:   [id number] [place] Diagnosing Phys: Sheppard Coil  Paraschos MD IMPRESSIONS  1. Left ventricular ejection fraction, by estimation, is 45 to 50%. The left ventricle has mildly decreased function. The left ventricle has no regional wall motion abnormalities. Left ventricular diastolic parameters were normal.  2. Right ventricular systolic function is normal. The right ventricular size is normal. There is severely elevated pulmonary artery systolic pressure.  3. Left atrial size was moderately dilated.  4. The mitral valve is normal in structure. Moderate to severe mitral valve regurgitation. No evidence of mitral stenosis.  5. Tricuspid valve regurgitation is moderate.  6. The aortic valve is normal in structure. Aortic valve regurgitation is mild. No aortic stenosis is present.  7. The inferior vena cava is normal in size with greater than 50% respiratory variability, suggesting right atrial pressure of 3 mmHg. FINDINGS  Left Ventricle: Left ventricular ejection fraction, by estimation, is 45 to 50%. The left ventricle has mildly decreased function. The left ventricle has no regional wall motion abnormalities. The left ventricular internal cavity size was normal in size. There is no left ventricular hypertrophy. Left ventricular diastolic parameters were normal. Right Ventricle: The right ventricular size is normal. No increase in right ventricular wall thickness. Right ventricular systolic function is normal. There is severely elevated pulmonary artery systolic pressure. The tricuspid regurgitant velocity is 3.56 m/s, and with an assumed right atrial pressure of 10 mmHg, the estimated right ventricular systolic pressure is 06.2 mmHg. Left Atrium: Left atrial size was moderately dilated. Right Atrium: Right atrial size was normal in size. Pericardium: There is no evidence of pericardial effusion. Mitral Valve: The mitral valve is normal in structure. Normal mobility of the mitral valve leaflets. Moderate to  severe mitral valve regurgitation. No evidence of mitral valve stenosis. Tricuspid Valve: The tricuspid valve is normal in structure. Tricuspid valve regurgitation is moderate . No evidence of tricuspid stenosis. Aortic Valve: The aortic valve is normal in structure. Aortic valve regurgitation is mild. No aortic stenosis is present. Aortic valve mean gradient measures 4.0 mmHg. Aortic valve peak gradient measures 6.8 mmHg. Aortic valve area, by VTI measures 2.49 cm. Pulmonic Valve: The pulmonic valve was normal in structure. Pulmonic valve regurgitation is not visualized. No evidence of pulmonic stenosis. Aorta: The aortic root is normal in size and structure. Venous: The inferior vena cava is normal in size with greater than 50% respiratory variability, suggesting right atrial pressure of 3 mmHg. IAS/Shunts: No atrial level shunt detected by color flow Doppler.  LEFT VENTRICLE PLAX 2D LVIDd:         5.30 cm      Diastology LVIDs:         4.03 cm      LV e' lateral:   10.10 cm/s LV PW:         1.13 cm      LV E/e' lateral: 10.1 LV IVS:        1.07 cm      LV e' medial:    6.74 cm/s LVOT diam:     2.10 cm      LV E/e' medial:  15.1 LV SV:         58 LV SV Index:   33 LVOT Area:     3.46 cm  LV Volumes (MOD) LV vol d, MOD A2C: 111.0 ml LV vol d, MOD A4C: 129.0 ml LV vol s, MOD A2C: 61.4 ml LV vol s, MOD A4C: 60.0 ml LV SV MOD A2C:     49.6 ml LV SV MOD A4C:  129.0 ml LV SV MOD BP:      59.1 ml RIGHT VENTRICLE RV Basal diam:  3.39 cm RV S prime:     11.30 cm/s TAPSE (M-mode): 4.5 cm LEFT ATRIUM            Index       RIGHT ATRIUM           Index LA diam:      4.50 cm  2.59 cm/m  RA Area:     15.20 cm LA Vol (A2C): 130.0 ml 74.93 ml/m RA Volume:   34.90 ml  20.12 ml/m LA Vol (A4C): 38.6 ml  22.25 ml/m  AORTIC VALVE                   PULMONIC VALVE AV Area (Vmax):    1.92 cm    PV Vmax:        0.47 m/s AV Area (Vmean):   2.04 cm    PV Peak grad:   0.9 mmHg AV Area (VTI):     2.49 cm    RVOT Peak grad: 3 mmHg  AV Vmax:           130.50 cm/s AV Vmean:          92.850 cm/s AV VTI:            0.232 m AV Peak Grad:      6.8 mmHg AV Mean Grad:      4.0 mmHg LVOT Vmax:         72.50 cm/s LVOT Vmean:        54.800 cm/s LVOT VTI:          0.167 m LVOT/AV VTI ratio: 0.72  AORTA Ao Root diam: 2.60 cm MITRAL VALVE                TRICUSPID VALVE MV Area (PHT): 5.34 cm     TR Peak grad:   50.7 mmHg MV Decel Time: 142 msec     TR Vmax:        356.00 cm/s MV E velocity: 102.00 cm/s MV A velocity: 41.10 cm/s   SHUNTS MV E/A ratio:  2.48         Systemic VTI:  0.17 m                             Systemic Diam: 2.10 cm Isaias Cowman MD Electronically signed by Isaias Cowman MD Signature Date/Time: 12/07/2019/4:48:11 PM    Final     Review of Systems  Constitutional: Negative for chills and fever.  HENT: Negative for sinus pain and sore throat.   Respiratory: Negative for cough and shortness of breath.   Cardiovascular: Negative for chest pain and palpitations.  Gastrointestinal: Negative for nausea and vomiting.  Endocrine: Negative for polydipsia and polyuria.  Genitourinary:       Chronic kidney disease.  On dialysis.  Musculoskeletal:       Patient relates some significant pain with his left second toe.  Skin:       Full-thickness ulceration on the medial aspect of the left second toe with some early cyanotic and discussed with the patient will consider gangrenous discoloration.  Neurological:       Denies numbness or paresthesias.  Psychiatric/Behavioral: Negative for confusion. The patient is not nervous/anxious.    Blood pressure (!) 145/75, pulse 91, temperature 97.8 F (36.6 C), resp. rate 15, height 5\' 11"  (1.803 m),  weight 57.3 kg, SpO2 94 %. Physical Exam Cardiovascular:     Comments: DP and PT pulses could not be palpated bilateral. Musculoskeletal:     Comments: Adequate range of motion of the pedal joints.  Some hammertoe contracture in the left second toe.  Skin:    Comments: The skin  is warm dry and atrophic with absent hair growth.  Discoloration in the left second toe with full-thickness ulceration medially.  Some apparent early gangrenous changes to the second toe.  Thick dystrophic toenails and all of his toes.  Neurological:     Comments: Protective threshold with a monofilament wire intact and symmetric bilateral to all digits.     Assessment/Plan: Assessment: 1.  Osteomyelitis left second toe. 2.  Full-thickness ulceration left second toe. 3.  Peripheral vascular disease. 4.  Early gangrenous changes left second toe.  Plan: Discussed with the patient the need for amputation of his left second toe due to the infected bone.  Discussed with the patient the plan at this point is for vascular intervention tomorrow to try to increase his circulation.  Discussed possible risks and complications of the procedure for amputation of the second toe including inability to heal due to infection or poor circulation.  No guarantees could be given as to the outcome of his procedure.  Questions were invited and answered.  At this point plan for vascular intervention tomorrow and amputation of the left second toe on Friday.  [name] 12/09/2019, 1:20 PM

## 2019-12-10 ENCOUNTER — Encounter: Payer: Self-pay | Admitting: Internal Medicine

## 2019-12-10 ENCOUNTER — Encounter: Payer: Self-pay | Admitting: Certified Registered"

## 2019-12-10 ENCOUNTER — Encounter
Admission: EM | Disposition: A | Discharge status: Home / Self Care | Payer: Self-pay | Source: Home / Self Care | Attending: Internal Medicine

## 2019-12-10 DIAGNOSIS — I70262 Atherosclerosis of native arteries of extremities with gangrene, left leg: Secondary | ICD-10-CM

## 2019-12-10 DIAGNOSIS — Z992 Dependence on renal dialysis: Secondary | ICD-10-CM

## 2019-12-10 DIAGNOSIS — N186 End stage renal disease: Secondary | ICD-10-CM

## 2019-12-10 HISTORY — PX: LOWER EXTREMITY ANGIOGRAPHY: CATH118251

## 2019-12-10 LAB — BASIC METABOLIC PANEL
Anion gap: 10 (ref 5–15)
BUN: 29 mg/dL — ABNORMAL HIGH (ref 6–20)
CO2: 26 mmol/L (ref 22–32)
Calcium: 8.1 mg/dL — ABNORMAL LOW (ref 8.9–10.3)
Chloride: 102 mmol/L (ref 98–111)
Creatinine, Ser: 4.03 mg/dL — ABNORMAL HIGH (ref 0.61–1.24)
GFR calc Af Amer: 18 mL/min — ABNORMAL LOW (ref >60)
GFR calc non Af Amer: 15 mL/min — ABNORMAL LOW (ref >60)
Glucose, Bld: 67 mg/dL — ABNORMAL LOW (ref 70–99)
Potassium: 3.8 mmol/L (ref 3.5–5.1)
Sodium: 138 mmol/L (ref 135–145)

## 2019-12-10 LAB — GLUCOSE, CAPILLARY
Glucose-Capillary: 30 mg/dL — CL (ref 70–99)
Glucose-Capillary: 30 mg/dL — CL (ref 70–99)
Glucose-Capillary: 87 mg/dL (ref 70–99)
Glucose-Capillary: 149 mg/dL — ABNORMAL HIGH (ref 70–99)
Glucose-Capillary: 42 mg/dL — CL (ref 70–99)
Glucose-Capillary: 44 mg/dL — CL (ref 70–99)
Glucose-Capillary: 102 mg/dL — ABNORMAL HIGH (ref 70–99)
Glucose-Capillary: 137 mg/dL — ABNORMAL HIGH (ref 70–99)
Glucose-Capillary: 109 mg/dL — ABNORMAL HIGH (ref 70–99)
Glucose-Capillary: 229 mg/dL — ABNORMAL HIGH (ref 70–99)

## 2019-12-10 LAB — GLUCOSE, RANDOM: Glucose, Bld: 74 mg/dL (ref 70–99)

## 2019-12-10 LAB — HEMOGLOBIN A1C
Hgb A1c MFr Bld: 7.4 % — ABNORMAL HIGH (ref 4.8–5.6)
Mean Plasma Glucose: 165.68 mg/dL

## 2019-12-10 SURGERY — LOWER EXTREMITY ANGIOGRAPHY
Anesthesia: Moderate Sedation | Laterality: Left

## 2019-12-10 MED ORDER — HEPARIN SODIUM (PORCINE) 1000 UNIT/ML IJ SOLN
INTRAMUSCULAR | Status: DC | PRN
  Administered 2019-12-10: 5000 [IU] via INTRAVENOUS

## 2019-12-10 MED ORDER — DEXTROSE 50 % IV SOLN
INTRAVENOUS | Status: AC
  Administered 2019-12-10: 25 g via INTRAVENOUS
  Filled 2019-12-10: qty 50

## 2019-12-10 MED ORDER — FENTANYL CITRATE (PF) 100 MCG/2ML IJ SOLN
INTRAMUSCULAR | Status: AC
  Filled 2019-12-10: qty 2

## 2019-12-10 MED ORDER — METHYLPREDNISOLONE SODIUM SUCC 125 MG IJ SOLR: 125.0000 mg | Freq: Once | INTRAMUSCULAR | Status: DC | PRN

## 2019-12-10 MED ORDER — FAMOTIDINE 20 MG PO TABS: 40.0000 mg | ORAL_TABLET | Freq: Once | ORAL | Status: DC | PRN

## 2019-12-10 MED ORDER — IODIXANOL 320 MG/ML IV SOLN
INTRAVENOUS | Status: DC | PRN
  Administered 2019-12-10: 50 mL

## 2019-12-10 MED ORDER — MIDAZOLAM HCL 2 MG/ML PO SYRP: 8.0000 mg | ORAL_SOLUTION | Freq: Once | ORAL | Status: DC | PRN

## 2019-12-10 MED ORDER — DEXTROSE 10 % IV SOLN: INTRAVENOUS | Status: DC

## 2019-12-10 MED ORDER — PIPERACILLIN-TAZOBACTAM IN DEX 2-0.25 GM/50ML IV SOLN
2.2500 g | Freq: Three times a day (TID) | INTRAVENOUS | Status: DC
  Administered 2019-12-10 – 2019-12-13 (×8): 2.25 g via INTRAVENOUS
  Filled 2019-12-10 (×11): qty 50

## 2019-12-10 MED ORDER — MIDAZOLAM HCL 2 MG/2ML IJ SOLN
INTRAMUSCULAR | Status: DC | PRN
  Administered 2019-12-10: 2 mg via INTRAVENOUS

## 2019-12-10 MED ORDER — DIPHENHYDRAMINE HCL 50 MG/ML IJ SOLN: 50.0000 mg | Freq: Once | INTRAMUSCULAR | Status: DC | PRN

## 2019-12-10 MED ORDER — DEXTROSE-NACL 5-0.9 % IV SOLN: INTRAVENOUS | Status: DC

## 2019-12-10 MED ORDER — ONDANSETRON HCL 4 MG/2ML IJ SOLN: 4.0000 mg | Freq: Four times a day (QID) | INTRAMUSCULAR | Status: DC | PRN

## 2019-12-10 MED ORDER — HYDROMORPHONE HCL 1 MG/ML IJ SOLN: 1.0000 mg | Freq: Once | INTRAMUSCULAR | Status: DC | PRN

## 2019-12-10 MED ORDER — HEPARIN SODIUM (PORCINE) 1000 UNIT/ML IJ SOLN
INTRAMUSCULAR | Status: AC
  Filled 2019-12-10: qty 1

## 2019-12-10 MED ORDER — DEXTROSE 50 % IV SOLN
1.0000 | Freq: Once | INTRAVENOUS | Status: AC
  Administered 2019-12-10: 50 mL via INTRAVENOUS

## 2019-12-10 MED ORDER — CEFAZOLIN SODIUM-DEXTROSE 1-4 GM/50ML-% IV SOLN
INTRAVENOUS | Status: AC
  Filled 2019-12-10: qty 50

## 2019-12-10 MED ORDER — SODIUM CHLORIDE 0.9 % IV SOLN: INTRAVENOUS | Status: DC

## 2019-12-10 MED ORDER — FENTANYL CITRATE (PF) 100 MCG/2ML IJ SOLN
INTRAMUSCULAR | Status: DC | PRN
  Administered 2019-12-10: 50 ug via INTRAVENOUS

## 2019-12-10 MED ORDER — DEXTROSE 50 % IV SOLN: 25.0000 g | INTRAVENOUS | Status: AC

## 2019-12-10 MED ORDER — DEXTROSE 50 % IV SOLN
INTRAVENOUS | Status: AC
  Filled 2019-12-10: qty 50

## 2019-12-10 MED ORDER — MIDAZOLAM HCL 5 MG/5ML IJ SOLN
INTRAMUSCULAR | Status: AC
  Filled 2019-12-10: qty 5

## 2019-12-10 MED ORDER — CEFAZOLIN SODIUM-DEXTROSE 1-4 GM/50ML-% IV SOLN
1.0000 g | Freq: Once | INTRAVENOUS | Status: AC
  Filled 2019-12-10: qty 50

## 2019-12-10 SURGICAL SUPPLY — 17 items
BALLN LUTONIX 018 5X220X130 (BALLOONS) ×3
BALLN ULTRVRSE 018 2.5X150X150 (BALLOONS) ×3
BALLOON LUTONIX 018 5X220X130 (BALLOONS) IMPLANT
BALLOON ULTRVS 018 2.5X150X150 (BALLOONS) IMPLANT
CATH ANGIO 5F PIGTAIL 65CM (CATHETERS) ×2 IMPLANT
CATH VERT 5FR 125CM (CATHETERS) ×2 IMPLANT
DEVICE PRESTO INFLATION (MISCELLANEOUS) ×2 IMPLANT
DEVICE STARCLOSE SE CLOSURE (Vascular Products) ×2 IMPLANT
GLIDEWIRE ADV .035X260CM (WIRE) ×2 IMPLANT
PACK ANGIOGRAPHY (CUSTOM PROCEDURE TRAY) ×3 IMPLANT
SHEATH ANL2 6FRX45 HC (SHEATH) ×2 IMPLANT
SHEATH BRITE TIP 5FRX11 (SHEATH) ×4 IMPLANT
STENT VIABAHN 6X150X120 (Permanent Stent) ×2 IMPLANT
SYR MEDRAD MARK 7 150ML (SYRINGE) ×2 IMPLANT
TUBING CONTRAST HIGH PRESS 72 (TUBING) ×2 IMPLANT
WIRE G V18X300CM (WIRE) ×2 IMPLANT
WIRE J 3MM .035X145CM (WIRE) ×2 IMPLANT

## 2019-12-10 NOTE — H&P (Signed)
[facility] History & Physical Update  The patient was interviewed and re-examined.  The patient's previous History and Physical has been reviewed and is unchanged.  There is no change in the plan of care. We plan to proceed with the scheduled procedure.  Leotis Pain, MD  12/10/2019, 11:13 AM

## 2019-12-10 NOTE — Progress Notes (Signed)
Day of Surgery   Subjective/Chief Complaint: Patient seen.  In dialysis.  Still some pain in the toe.   Objective: Vital signs in last 24 hours: Temp:  [97.4 F (36.3 C)-98.6 F (37 C)] 97.9 F (36.6 C) (07/29 1800) Pulse Rate:  [71-92] 84 (07/29 1715) Resp:  [13-23] 22 (07/29 1545) BP: (114-160)/(68-89) 145/77 (07/29 1545) SpO2:  [90 %-100 %] 100 % (07/29 1715) Last BM Date: 12/10/19  Intake/Output from previous day: 07/28 0701 - 07/29 0700 In: 268.3 [P.O.:240; IV Piggyback:28.3] Out: -  Intake/Output this shift: No intake/output data recorded.  Light bandage on the second toe.  Wound not assessed today.  Lab Results:  Recent Labs    12/09/19 0535  WBC 5.3  HGB 8.4*  HCT 25.2*  PLT 154   BMET Recent Labs    12/10/19 0427 12/10/19 0921  NA 138  --   K 3.8  --   CL 102  --   CO2 26  --   GLUCOSE 67* 74  BUN 29*  --   CREATININE 4.03*  --   CALCIUM 8.1*  --    PT/INR No results for input(s): LABPROT, INR in the last 72 hours. ABG No results for input(s): PHART, HCO3 in the last 72 hours.  Invalid input(s): PCO2, PO2  Studies/Results: MR FOOT LEFT WO CONTRAST  Result Date: 12/08/2019 CLINICAL DATA:  Question of osteomyelitis of the second toe diabetic foot infection EXAM: MRI OF THE LEFT FOOT WITHOUT CONTRAST TECHNIQUE: Multiplanar, multisequence MR imaging of the left was performed. No intravenous contrast was administered. COMPARISON:  December 07, 2019 radiograph FINDINGS: Bones/Joint/Cartilage There is cortical irregularity with erosive type changes seen through the distal second phalanx. There is T2 hyperintense signal with T1 hypointensity seen throughout the distal phalanx. There is increased T2 hyperintense signal seen within the middle phalanx of the second digit without T1 hypointensity or cortical irregularity. Erosive type change or prior osteotomies are seen at the fourth and third MTP joints. There is also mildly increased signal seen within the  metatarsals and midfoot without associated T1 hypointensity. Ligaments The Lisfranc ligaments appear to be intact. Muscles and Tendons Fatty atrophy with increased signal seen throughout the muscles of the forefoot. The flexor and extensor tendons appear to be grossly intact. Soft tissues Area of ulceration seen overlying distal second phalanx with diffuse soft tissue edema and skin thickening. No loculated fluid collection or sinus tract is seen. There is mild dorsal soft tissue swelling seen over the forefoot. IMPRESSION: 1. Area of ulceration over the distal second digit with findings of acute osteomyelitis involving the distal phalanx. No loculated fluid collections or sinus tract. 2. Reactive marrow edema within the middle phalanx of the second digit and midfoot. Electronically Signed   By: Prudencio Pair M.D.   On: 12/08/2019 21:54   PERIPHERAL VASCULAR CATHETERIZATION  Result Date: 12/10/2019 See op note   Anti-infectives: Anti-infectives (From admission, onward)   Start     Dose/Rate Route Frequency Ordered Stop   12/10/19 1400  piperacillin-tazobactam (ZOSYN) IVPB 2.25 g     Discontinue     2.25 g 100 mL/hr over 30 Minutes Intravenous Every 8 hours 12/10/19 1056     12/10/19 1045  ceFAZolin (ANCEF) IVPB 1 g/50 mL premix       Note to Pharmacy: To be given in specials   1 g 100 mL/hr over 30 Minutes Intravenous  Once 12/10/19 1032 12/10/19 1247   12/09/19 2200  piperacillin-tazobactam (ZOSYN) IVPB 3.375 g  Status:  Discontinued        3.375 g 12.5 mL/hr over 240 Minutes Intravenous Every 8 hours 12/09/19 1944 12/10/19 1056   12/06/19 1530  cefTRIAXone (ROCEPHIN) 2 g in sodium chloride 0.9 % 100 mL IVPB        2 g 200 mL/hr over 30 Minutes Intravenous  Once 12/06/19 1515 12/06/19 1633   12/06/19 1530  azithromycin (ZITHROMAX) 500 mg in sodium chloride 0.9 % 250 mL IVPB        500 mg 250 mL/hr over 60 Minutes Intravenous  Once 12/06/19 1515 12/06/19 1806      Assessment/Plan: s/p  Procedure(s): Lower Extremity Angiography (Left) Assessment: Osteomyelitis left second toe with severe peripheral vascular disease.   Plan: Discussed with the patient the results of his vascular procedure performed earlier today.  Discussed that there is minimal blood flow making its way down into his foot even though some of the larger vessels in the leg were able to be opened up some.  Discussed that at this point I still think we need to proceed with amputation of the second toe to try to get rid of the infection but that no guarantees could be given as to healing of the amputation and he is still at significant risk for the need for a higher level amputation up into the leg.  The same conversation was also carried out by phone with his wife.  Questions were invited and answered.  At this point we will plan for amputation of the left second toe tomorrow morning.  Patient will be n.p.o. after midnight.  Consent form for amputation left second toe.  Patient's wife said she would be coming by the hospital later tonight and would be able to discuss this more with him.  Plan for surgery tomorrow morning for amputation of the left second toe.  LOS: 4 days    [name] 12/10/2019

## 2019-12-10 NOTE — Progress Notes (Signed)
Returned from dialysis in stable condition.

## 2019-12-10 NOTE — Progress Notes (Signed)
Pt off unit for procedure in stable condition.

## 2019-12-10 NOTE — Progress Notes (Signed)
PROGRESS NOTE    [name]  MWN:[id number] DOB: 11-07-1960 DOA: 12/06/2019 PCP: [name], MD   Brief Narrative: [name] is a 59 y.o. male with a history of uncontrolled DM, poorly controlled HTN, ESRD on HD, previous alcohol use no use x1 year. Patient presented secondary to altered mental status and found to be hypoglycemic and severely fluid overloaded requiring emergent HD.   Assessment & Plan:   Principal Problem:   Acute respiratory failure with hypoxia (HCC) Active Problems:   Uncontrolled diabetes mellitus (HCC)   Alcohol abuse   HTN (hypertension)   CVA (cerebral vascular accident) ([place])   Chronic osteomyelitis of toe of left foot (HCC)   Diabetic foot infection ([place])   Acute pulmonary edema (HCC)   Acute respiratory failure with hypoxia Secondary to acute pulmonary edema from missed HD. Patient received emergent HD on the night of admission with improvement of symptoms and hypoxia. Patient also received lasix IV -Wean oxygen to room air as able -HD per nephrology -Patient to get dialysis again today 12/10/2019  Acute pulmonary edema Secondary to fluid overload. Improved with HD. -Nephrology recommendations for HD  ESRD on HD -HD per nephrology  Diabetic foot infection Per patient, he was followed as an outpatient for management of this foot infection for the last month. He was previously on antibiotics for which he has completed. Left second digit with purulent drainage and concern for possible osteomyelitis. Feet also with poor pulses. Procalcitonin rising; no leukocytosis or fevers. Blood cultures with no growth to date. X-ray concerning for osteomyelitis -MRI of left foot-area of ulceration over the distal second digit with findings of acute osteomyelitis involving the distal phalanx.  Reactive marrow edema within the middle phalanx of the second digit in the midfoot. -ABI of bilateral feet -nondiagnostic ABI secondary to incompressible vascular  calcifications.  Decreased toe brachial indicis consistent with at least moderate bilateral arterial occlusive disease at rest.  He was started on IV antibiotics yesterday by infectious disease as his procalcitonin's were high and he was scheduled to have toe amputation today.  Wheezing-patient to have dialysis today. Given Duonebs. Resolved.  Essential hypertension Patient is on amlodipine, Coreg, Entresto, Hydralazine  Hypoglycemia Patient with a blood sugar of 21 on admission.  Patient is n.p.o. for the procedure.  I have ordered D10 and hold Lantus.  Will reassess in a.m. CBG (last 3)  Recent Labs    12/10/19 1132 12/10/19 1157 12/10/19 1321  GLUCAP 44*  42* 149* 102*    Diabetes mellitus, type 2 Patient is on Humalog TID with meals, Lantus 20 units daily as an outpatient which is on hold due to hypoglycemia and patient being n.p.o.  Will reassess in the morning and restart Lantus.   Acute metabolic encephalopathy Secondary to hypoglycemia. Resolved.  Depression -Continue sertraline  Malnutrition Unknown severity -Continue nutrition supplements  [name] Pancreatic pseudocyst -Outpatient follow-up  DVT prophylaxis: heparin subq Code Status:   Code Status: Full Code Family Communication: None at bedside. Wife on telephone Disposition Plan: Unknown patient now has osteomyelitis of the left second toe waiting on ID and vascular surgery evaluation.   Consultants:   Nephrology, vascular surgery  Procedures:   Hemodialysis  Antimicrobials:  None    Subjective: He is awake alert asking for food He denied any shortness of breath or cough to me when I saw him this morning Objective: Vitals:   12/10/19 1317 12/10/19 1330 12/10/19 1345 12/10/19 1351  BP: (!) 143/74 [name])  146/89 (!) 148/86 (!) 145/84  Pulse: 73 73 72 79  Resp: 18 19 21 18   Temp:      TempSrc:      SpO2: 90% 97% 100% 97%  Weight:      Height:        Intake/Output Summary  (Last 24 hours) at 12/10/2019 1427 Last data filed at 12/10/2019 0413 Gross per 24 hour  Intake 28.25 ml  Output --  Net 28.25 ml   Filed Weights   12/06/19 1241 12/07/19 1133  Weight: 72.6 kg 57.3 kg    Examination:  General exam: Appears calm and comfortable Respiratory system: Scattered rhonchi in both lung fields respiratory effort normal. Cardiovascular system: S1 & S2 heard, RRR. No murmurs, rubs, gallops or clicks. Right arm AV graft with palpable thrill and audible bruit Gastrointestinal system: Abdomen is nondistended, soft and nontender. No organomegaly or masses felt. Normal bowel sounds heard. Central nervous system: Alert and oriented. No focal neurological deficits. Musculoskeletal: Toe covered with dressing pictures noted with the left second toe gangrene Skin: No cyanosis. No rashes Psychiatry: Judgement and insight appear normal. Mood & affect appropriate.     Data Reviewed: I have personally reviewed following labs and imaging studies  CBC Lab Results  Component Value Date   WBC 5.3 12/09/2019   RBC 2.65 (L) 12/09/2019   HGB 8.4 (L) 12/09/2019   HCT 25.2 (L) 12/09/2019   MCV 95.1 12/09/2019   MCH 31.7 12/09/2019   PLT 154 12/09/2019   MCHC 33.3 12/09/2019   RDW 15.7 (H) 12/09/2019   LYMPHSABS 1.2 11/25/2019   MONOABS 0.3 11/25/2019   EOSABS 0.1 11/25/2019   BASOSABS 0.0 86/57/8469     Last metabolic panel Lab Results  Component Value Date   NA 138 12/10/2019   K 3.8 12/10/2019   CL 102 12/10/2019   CO2 26 12/10/2019   BUN 29 (H) 12/10/2019   CREATININE 4.03 (H) 12/10/2019   GLUCOSE 74 12/10/2019   GFRNONAA 15 (L) 12/10/2019   GFRAA 18 (L) 12/10/2019   CALCIUM 8.1 (L) 12/10/2019   PHOS 3.4 08/25/2019   PROT 7.4 12/06/2019   ALBUMIN 3.4 (L) 12/06/2019   LABGLOB 2.7 07/26/2018   AGRATIO 0.9 07/26/2018   BILITOT 0.7 12/06/2019   ALKPHOS 294 (H) 12/06/2019   AST 43 (H) 12/06/2019   ALT 38 12/06/2019   ANIONGAP 10 12/10/2019    CBG  (last 3)  Recent Labs    12/10/19 1132 12/10/19 1157 12/10/19 1321  GLUCAP 44*  42* 149* 102*     GFR: Estimated Creatinine Clearance: 15.8 mL/min (A) (by C-G formula based on SCr of 4.03 mg/dL (H)).  Coagulation Profile: No results for input(s): INR, PROTIME in the last 168 hours.  Recent Results (from the past 240 hour(s))  Culture, blood (routine x 2)     Status: None (Preliminary result)   Collection Time: 12/06/19  1:54 PM   Specimen: BLOOD  Result Value Ref Range Status   Specimen Description BLOOD LAC  Final   Special Requests   Final    BOTTLES DRAWN AEROBIC AND ANAEROBIC Blood Culture results may not be optimal due to an excessive volume of blood received in culture bottles   Culture   Final    NO GROWTH 4 DAYS Performed at [facility], [address] [place], [place] [address]    Report Status PENDING  Incomplete  Culture, blood (routine x 2)     Status: None (Preliminary result)  Collection Time: 12/06/19  1:54 PM   Specimen: BLOOD  Result Value Ref Range Status   Specimen Description BLOOD L BICEP  Final   Special Requests   Final    BOTTLES DRAWN AEROBIC AND ANAEROBIC Blood Culture results may not be optimal due to an excessive volume of blood received in culture bottles   Culture   Final    NO GROWTH 4 DAYS Performed at [facility], [address] [place], [place] [address]    Report Status PENDING  Incomplete  SARS Coronavirus 2 by RT PCR (hospital order, performed in [place] hospital lab) Nasopharyngeal Nasopharyngeal Swab     Status: None   Collection Time: 12/06/19  1:54 PM   Specimen: Nasopharyngeal Swab  Result Value Ref Range Status   SARS Coronavirus 2 NEGATIVE NEGATIVE Final    Comment: (NOTE) SARS-CoV-2 target nucleic acids are NOT DETECTED.  The SARS-CoV-2 RNA is generally detectable in upper and lower respiratory specimens during the acute phase of infection. The lowest concentration of SARS-CoV-2 viral copies  this assay can detect is 250 copies / mL. A negative result does not preclude SARS-CoV-2 infection and should not be used as the sole basis for treatment or other patient management decisions.  A negative result may occur with improper specimen collection / handling, submission of specimen other than nasopharyngeal swab, presence of viral mutation(s) within the areas targeted by this assay, and inadequate number of viral copies (<250 copies / mL). A negative result must be combined with clinical observations, patient history, and epidemiological information.  Fact Sheet for Patients:   StrictlyIdeas.no  Fact Sheet for Healthcare Providers: BankingDealers.co.za  This test is not yet approved or  cleared by the [place] FDA and has been authorized for detection and/or diagnosis of SARS-CoV-2 by FDA under an Emergency Use Authorization (EUA).  This EUA will remain in effect (meaning this test can be used) for the duration of the COVID-19 declaration under Section 564(b)(1) of the Act, 21 U.S.C. section 360bbb-3(b)(1), unless the authorization is terminated or revoked sooner.  Performed at [facility], [address] [place], [place] [address]         Radiology Studies: MR FOOT LEFT WO CONTRAST  Result Date: 12/08/2019 CLINICAL DATA:  Question of osteomyelitis of the second toe diabetic foot infection EXAM: MRI OF THE LEFT FOOT WITHOUT CONTRAST TECHNIQUE: Multiplanar, multisequence MR imaging of the left was performed. No intravenous contrast was administered. COMPARISON:  December 07, 2019 radiograph FINDINGS: Bones/Joint/Cartilage There is cortical irregularity with erosive type changes seen through the distal second phalanx. There is T2 hyperintense signal with T1 hypointensity seen throughout the distal phalanx. There is increased T2 hyperintense signal seen within the middle phalanx of the second digit without T1  hypointensity or cortical irregularity. Erosive type change or prior osteotomies are seen at the fourth and third MTP joints. There is also mildly increased signal seen within the metatarsals and midfoot without associated T1 hypointensity. Ligaments The Lisfranc ligaments appear to be intact. Muscles and Tendons Fatty atrophy with increased signal seen throughout the muscles of the forefoot. The flexor and extensor tendons appear to be grossly intact. Soft tissues Area of ulceration seen overlying distal second phalanx with diffuse soft tissue edema and skin thickening. No loculated fluid collection or sinus tract is seen. There is mild dorsal soft tissue swelling seen over the forefoot. IMPRESSION: 1. Area of ulceration over the distal second digit with findings of acute osteomyelitis involving the distal phalanx.  No loculated fluid collections or sinus tract. 2. Reactive marrow edema within the middle phalanx of the second digit and midfoot. Electronically Signed   By: Prudencio Pair M.D.   On: 12/08/2019 21:54   PERIPHERAL VASCULAR CATHETERIZATION  Result Date: 12/10/2019 See op note       Scheduled Meds: . [MAR Hold] amLODipine  5 mg Oral Q0600  . [MAR Hold] aspirin EC  81 mg Oral Daily  . [MAR Hold] carvedilol  6.25 mg Oral BID WC  . [MAR Hold] Chlorhexidine Gluconate Cloth  6 each Topical Daily  . dextrose      . [MAR Hold] epoetin (EPOGEN/PROCRIT) injection  4,000 Units Intravenous Q T,Th,Sa-HD  . [MAR Hold] feeding supplement (NEPRO CARB STEADY)  237 mL Oral TID BM  . fentaNYL      . [MAR Hold] heparin  5,000 Units Subcutaneous Q8H  . heparin sodium (porcine)      . [MAR Hold] hydrALAZINE  25 mg Oral Q8H  . [MAR Hold] insulin aspart  0-5 Units Subcutaneous QHS  . [MAR Hold] insulin aspart  0-6 Units Subcutaneous TID WC  . [MAR Hold] mouth rinse  15 mL Mouth Rinse BID  . [MAR Hold] melatonin  2.5 mg Oral QHS  . midazolam      . [MAR Hold] multivitamin  1 tablet Oral QHS  . [MAR  Hold] pantoprazole  40 mg Oral Daily  . [MAR Hold] sacubitril-valsartan  1 tablet Oral BID  . [MAR Hold] sertraline  100 mg Oral Daily  . [MAR Hold] sodium chloride flush  3 mL Intravenous Q12H   Continuous Infusions: . [MAR Hold] sodium chloride    . sodium chloride    . dextrose 50 mL/hr at 12/10/19 1216  . dextrose 5 % and 0.9% NaCl Stopped (12/10/19 1216)  . [MAR Hold] piperacillin-tazobactam (ZOSYN)  IV       LOS: 4 days   Landis Gandy MD [facility] 12/10/2019, 2:27 PM  If 7PM-7AM, please contact night-coverage [email or web address]

## 2019-12-10 NOTE — Progress Notes (Signed)
Inpatient Diabetes Program Recommendations  AACE/ADA: New Consensus Statement on Inpatient Glycemic Control   Target Ranges:  Prepandial:   less than 140 mg/dL      Peak postprandial:   less than 180 mg/dL (1-2 hours)      Critically ill patients:  140 - 180 mg/dL  Results for BRODRIC, SCHAUER (MRN [id number]) as of 12/10/2019 09:59  Ref. Range 12/09/2019 08:02 12/09/2019 11:21 12/09/2019 15:48 12/09/2019 20:48 12/09/2019 22:24 12/10/2019 07:51 12/10/2019 07:53 12/10/2019 09:12  Glucose-Capillary Latest Ref Range: 70 - 99 mg/dL 149 (H) 298 (H) 384 (H) 241 (H) 275 (H) 30 (LL) 30 (LL) 87    Review of Glycemic Control  Diabetes history:DM2 Outpatient Diabetes medications:Lantus 20 units daily, Humalog 0-15 units TID with meals Current orders for Inpatient glycemic control:Lantus 10 units daily, Novolog 0-6 units TID with meals, Novolog 0-5 units QHS  Inpatient Diabetes Program Recommendations:  Insulin - Basal: Fasting glucose 30 mg/dl today. Please consider decreasing Lantus to 5 units QHS.  Insulin-meal coverage: Once diet is resumed, please consider ordering Novolog 3 units TID with meals for meal coverage if patient eats at least 50% of meals.  Outpatient DM medications:Would recommend decreasing Lantus dose outpatient and decreasing Humalog correction to very sensitive scale (0-6 units TID with meals).  Thanks, Barnie Alderman, RN, MSN, CDE Diabetes Coordinator Inpatient Diabetes Program [phone] (Team Pager from 8am to 5pm)

## 2019-12-10 NOTE — Op Note (Signed)
[name]  Percutaneous Study/Intervention Procedural Note   Date of Surgery: 12/10/2019  Surgeon(s):Perel Hauschild    Assistants:none  Pre-operative Diagnosis: PAD with gangrene left foot  Post-operative diagnosis:  Same  Procedure(s) Performed:             1.  Ultrasound guidance for vascular access right femoral artery             2.  Catheter placement into left common femoral artery from right femoral approach             3.  Aortogram and selective left lower extremity angiogram occluding selective imaging of the anterior tibial artery and the tibioperoneal trunk to evaluate the peroneal artery and posterior tibial arteries             4.  Percutaneous transluminal angioplasty of left peroneal artery and tibioperoneal trunk with 2.5 mm diameter angioplasty balloon             5.   Percutaneous transluminal angioplasty of the left posterior tibial artery and tibioperoneal trunk with 2.5 mm diameter angioplasty balloon  6.  Percutaneous transluminal angioplasty of the left SFA and proximal popliteal artery with 5 mm diameter by 22 cm length Lutonix drug-coated angioplasty balloon  7.  Viabahn stent placement to the left SFA and proximal popliteal artery with 6 mm diameter by 15 cm length stent for greater than 50% residual stenosis after angioplasty             8.  StarClose closure device right femoral artery  EBL: 5 cc  Contrast: 50 cc  Fluoro Time: 8 minutes  Moderate Conscious Sedation Time: approximately 40 minutes using 2 mg of Versed and 50 mcg of Fentanyl              Indications:  Patient is a 59 y.o.male with gangrenous toes on the left foot. The patient has noninvasive study showing reduced toe brachial index with noncompressible ABIs. The patient is brought in for angiography for further evaluation and potential treatment.  Due to the limb threatening nature of the situation, angiogram was performed for attempted limb salvage. The patient is aware  that if the procedure fails, amputation would be expected.  The patient also understands that even with successful revascularization, amputation may still be required due to the severity of the situation.  Risks and benefits are discussed and informed consent is obtained.   Procedure:  The patient was identified and appropriate procedural time out was performed.  The patient was then placed supine on the table and prepped and draped in the usual sterile fashion. Moderate conscious sedation was administered during a face to face encounter with the patient throughout the procedure with my supervision of the RN administering medicines and monitoring the patient's vital signs, pulse oximetry, telemetry and mental status throughout from the start of the procedure until the patient was taken to the recovery room. Ultrasound was used to evaluate the right common femoral artery.  It was patent .  A digital ultrasound image was acquired.  A Seldinger needle was used to access the right common femoral artery under direct ultrasound guidance and a permanent image was performed.  A 0.035 J wire was advanced without resistance and a 5Fr sheath was placed.  Pigtail catheter was placed into the aorta and an AP aortogram was performed. The aorta and iliac arteries were calcific but not stenotic.  There is no stenosis then either iliac system with the aorta.  The  renal arteries had very sluggish flow with what appeared to be at least moderate disease on the right and mild to moderate disease on the left but was a little difficult to opacify due to the very sluggish flow in a patient with end-stage renal disease. I then crossed the aortic bifurcation and advanced to the left femoral head. Selective left lower extremity angiogram was then performed. This demonstrated that the mid and distal SFA had very calcific significant disease with 3 areas of greater than 70% stenosis over about a 15 cm span.  The proximal SFA and common  femoral artery were calcific but not stenotic.  The popliteal artery normalized in the mid segment and there was a typical tibial trifurcation.  On initial imaging, the tibial vessels were difficult to opacify.  Advancing a Kumpe catheter down to the origin of the anterior tibial artery selective imaging demonstrated this to be occluded about 5 to 6 cm beyond its origin with no significant distal reconstitution.  The catheter was then advanced into the tibioperoneal trunk over the advantage wire where selective imaging showed a high-grade calcific stenosis at the origins of both the peroneal artery and posterior tibial arteries as well as the distal tibioperoneal trunk which was greater than 90%.  The peroneal artery then had areas of disease over the first 8 to 10 cm creating greater than 70% stenosis.  The posterior tibial artery had 2 areas of greater than 80% stenosis 1 in the proximal to mid segment and then 1 in the mid to distal segment.  This was the dominant runoff to the foot with the peroneal artery providing some collateral flow into the foot as well. It was felt that it was in the patient's best interest to proceed with intervention after these images to avoid a second procedure and a larger amount of contrast and fluoroscopy based off of the findings from the initial angiogram. The patient was systemically heparinized and a 6 [place] [employer] sheath was then placed over the [employer] wire. I then used a Kumpe catheter and the advantage wire to navigate down into the tibioperoneal trunk and then exchanged for a V 18 wire and initially cross the tibioperoneal trunk and proximal peroneal artery stenoses.  The wire was parked distally.  A 2.5 mm diameter by 15 cm length angioplasty balloon was then inflated to 10 atm for 1 minute in the proximal peroneal artery and tibioperoneal trunk.  Completion imaging showed marked improvement with less than 20% residual stenosis.  I then turned my attention to the  posterior tibial artery.  Using the Kumpe catheter and the V 18 wire was able to navigate down into the posterior tibial artery and across the multiple areas of stenosis without difficulty.  2 inflations with a 2.5 mm diameter by 15 cm length angioplasty balloon were then performed to encompass the multiple lesions in the posterior tibial artery up to the tibioperoneal trunk.  Both inflations were 10 to 12 atm for a minute.  Completion imaging showed markedly improved flow with less than 30% residual stenosis in the posterior tibial artery and tibioperoneal trunk.  I then turned my attention of the distal SFA and proximal popliteal artery.  A 5 mm diameter by 22 cm length Lutonix drug-coated angioplasty balloon was inflated to 12 atm for 1 minute in this location.  Completion imaging showed 2 areas of greater than 50% residual stenosis and so I elected to place a stent.  A 6 mm diameter by 15 cm length  Viabahn stent was deployed from Hunter's canal up to the proximal to mid SFA.  This is postdilated with 5 mm balloon with excellent angiographic completion result and less than 10% residual stenosis. I elected to terminate the procedure. The sheath was removed and StarClose closure device was deployed in the right femoral artery with excellent hemostatic result. The patient was taken to the recovery room in stable condition having tolerated the procedure well.  Findings:               Aortogram:  The aorta and iliac arteries were calcific but not stenotic.  There is no stenosis then either iliac system with the aorta.  The renal arteries had very sluggish flow with what appeared to be at least moderate disease on the right and mild to moderate disease on the left but was a little difficult to opacify due to the very sluggish flow in a patient with end-stage renal disease.             Left lower Extremity:  This demonstrated that the mid and distal SFA had very calcific significant disease with 3 areas of greater  than 70% stenosis over about a 15 cm span.  The proximal SFA and common femoral artery were calcific but not stenotic.  The popliteal artery normalized in the mid segment and there was a typical tibial trifurcation.  On initial imaging, the tibial vessels were difficult to opacify.  Advancing a Kumpe catheter down to the origin of the anterior tibial artery selective imaging demonstrated this to be occluded about 5 to 6 cm beyond its origin with no significant distal reconstitution.  The catheter was then advanced into the tibioperoneal trunk over the advantage wire where selective imaging showed a high-grade calcific stenosis at the origins of both the peroneal artery and posterior tibial arteries as well as the distal tibioperoneal trunk which was greater than 90%.  The peroneal artery then had areas of disease over the first 8 to 10 cm creating greater than 70% stenosis.  The posterior tibial artery had 2 areas of greater than 80% stenosis 1 in the proximal to mid segment and then 1 in the mid to distal segment.  This was the dominant runoff to the foot with the peroneal artery providing some collateral flow into the foot as well.   Disposition: Patient was taken to the recovery room in stable condition having tolerated the procedure well.  Complications: None  Leotis Pain 12/10/2019 1:05 PM   This note was created with Dragon Medical transcription system. Any errors in dictation are purely unintentional.

## 2019-12-10 NOTE — Care Management Important Message (Signed)
Important Message  Patient Details  Name: [name] MRN: [id number] Date of Birth: [birth year]   Medicare Important Message Given:  Yes     [name] [name] 12/10/2019, 11:06 AM

## 2019-12-10 NOTE — Progress Notes (Signed)
Patient presented to the specials recovery area complaining of a cough. Upon auscultation of the lungs patient was found to have rhonchi in all lung fields. RR was 16-22, and oxygen saturation was 96% on 2L. CBG was also done per protocol and it was found to be in the low 40's range. The patient was responsive and did not appear to be struggling for his breath. Dr. Lucky Cowboy was informed at the bedside, 1 amp of dextrose was ordered and D5 fluids was increased to 75 ml/hr. Hospitalist was also informed of the new findings as this was not reported to myself. Per Dr. Rodena Piety fluids will be changed to D10 and decreased to 50 ml/hr to limit fluid overload. CPAP will be placed on the patient during the procedure to optimize breathing during.  Vaughan Sine, RN SPR 12/10/2019

## 2019-12-10 NOTE — Consult Note (Signed)
Pharmacy Antibiotic Note  [name] is a 59 y.o. male admitted on 12/06/2019 with gangrene.  Pharmacy has been consulted for pip/tazo dosing. Patient with h/o ESRD on dialysis 3 times a week.  Plan: Will decrease Zosyn to 2.25g IV q8h for hemodialysis dosing  Height: 5\' 11"  (180.3 cm) Weight: 57.3 kg (126 lb 6.4 oz) IBW/kg (Calculated) : 75.3  Temp (24hrs), Avg:98 F (36.7 C), Min:97.4 F (36.3 C), Max:98.6 F (37 C)  Recent Labs  Lab 12/06/19 1248 12/06/19 1354 12/06/19 2100 12/07/19 0639 12/09/19 0535 12/10/19 0427  WBC 7.0  --   --  4.4 5.3  --   CREATININE 3.85*  --   --  2.62*  --  4.03*  LATICACIDVEN  --  2.3* 1.4  --   --   --     Estimated Creatinine Clearance: 15.8 mL/min (A) (by C-G formula based on SCr of 4.03 mg/dL (H)).    Allergies  Allergen Reactions  . Ferrous Gluconate Nausea And Vomiting  . Other     Antimicrobials this admission: 7/28 pip/tazo >>   Dose adjustments this admission: 7/29 Zosyn 3.375g IV q8h EI > 2.25g IV q8h  Microbiology results: 7/25 BCx: pending  Thank you for allowing pharmacy to be a part of this patient's care.  Paulina Fusi, PharmD, BCPS 12/10/2019 11:05 AM

## 2019-12-10 NOTE — Progress Notes (Signed)
[facility], [place] 12/10/19  Subjective:   LOS: 4  Patient initially presented to ER for decreased responsiveness.  Found to have low blood sugar of 26. Patient cut his dialysis treatment chart on Saturday because of diarrhea.  In the ER, he was also found to be significantly hypoxic.  CT angiogram showed cardiomegaly with changes of pulmonary edema and CHF. Patient was dialyzed emergently at admission and 2500 cc of volume was removed  12/10/2019: underwent angiogram and angioplasty of LLE  Blood sugar low; required D 10 infusion    HEMODIALYSIS FLOWSHEET:  Blood Flow Rate (mL/min): 400 mL/min Arterial Pressure (mmHg): -150 mmHg Venous Pressure (mmHg): 110 mmHg Transmembrane Pressure (mmHg): 50 mmHg Ultrafiltration Rate (mL/min): 340 mL/min Dialysate Flow Rate (mL/min): 600 ml/min Conductivity: Machine : 14.1 Conductivity: Machine : 14.1 Dialysis Fluid Bolus: Normal Saline Bolus Amount (mL): 200 mL     Objective:  Vital signs in last 24 hours:  Temp:  [97.4 F (36.3 C)-98.6 F (37 C)] 97.5 F (36.4 C) (07/29 1415) Pulse Rate:  [71-92] 71 (07/29 1415) Resp:  [13-23] 18 (07/29 1415) BP: (114-160)/(68-89) 160/88 (07/29 1415) SpO2:  [90 %-100 %] 100 % (07/29 1445)  Weight change:  Filed Weights   12/06/19 1241 12/07/19 1133  Weight: 72.6 kg 57.3 kg    Intake/Output:    Intake/Output Summary (Last 24 hours) at 12/10/2019 1552 Last data filed at 12/10/2019 0413 Gross per 24 hour  Intake 28.25 ml  Output --  Net 28.25 ml     Physical Exam: General:  Appears comfortable, laying in the bed  HEENT  moist oral mucous membranes  Pulm/lungs  decreased breath sounds at bases, [place] O2  CVS/Heart  no rub, irregular rhythm  Abdomen:   Soft, nontender  Extremities:  1+ pitting edema  Neurologic:  Alert, resting quietly  Skin:  Warm, dry   Right IJ PermCath   Right arm AV graft placed 11/25/19; Dr Delana Meyer    Basic Metabolic  Panel:  Recent Labs  Lab 12/06/19 1248 12/07/19 0639 12/10/19 0427 12/10/19 0921  NA 139 141 138  --   K 3.8 3.1* 3.8  --   CL 97* 101 102  --   CO2 30 29 26   --   GLUCOSE 53* 288* 67* 74  BUN 46* 26* 29*  --   CREATININE 3.85* 2.62* 4.03*  --   CALCIUM 8.9 7.8* 8.1*  --      CBC: Recent Labs  Lab 12/06/19 1248 12/07/19 0639 12/09/19 0535  WBC 7.0 4.4 5.3  HGB 9.5* 7.9* 8.4*  HCT 29.9* 24.8* 25.2*  MCV 98.4 96.1 95.1  PLT 230 173 154      Lab Results  Component Value Date   HEPBSAG NON REACTIVE 12/06/2019   HEPBSAB NON REACTIVE 04/10/2019      Microbiology:  Recent Results (from the past 240 hour(s))  Culture, blood (routine x 2)     Status: None (Preliminary result)   Collection Time: 12/06/19  1:54 PM   Specimen: BLOOD  Result Value Ref Range Status   Specimen Description BLOOD LAC  Final   Special Requests   Final    BOTTLES DRAWN AEROBIC AND ANAEROBIC Blood Culture results may not be optimal due to an excessive volume of blood received in culture bottles   Culture   Final    NO GROWTH 4 DAYS Performed at [facility], [address] [place], [place] [address]    Report Status PENDING  Incomplete  Culture, blood (routine x 2)     Status: None (Preliminary result)   Collection Time: 12/06/19  1:54 PM   Specimen: BLOOD  Result Value Ref Range Status   Specimen Description BLOOD L BICEP  Final   Special Requests   Final    BOTTLES DRAWN AEROBIC AND ANAEROBIC Blood Culture results may not be optimal due to an excessive volume of blood received in culture bottles   Culture   Final    NO GROWTH 4 DAYS Performed at [facility], [address], [place], [place] [address]    Report Status PENDING  Incomplete  SARS Coronavirus 2 by RT PCR (hospital order, performed in [place] hospital lab) Nasopharyngeal Nasopharyngeal Swab     Status: None   Collection Time: 12/06/19  1:54 PM   Specimen: Nasopharyngeal Swab  Result Value Ref  Range Status   SARS Coronavirus 2 NEGATIVE NEGATIVE Final    Comment: (NOTE) SARS-CoV-2 target nucleic acids are NOT DETECTED.  The SARS-CoV-2 RNA is generally detectable in upper and lower respiratory specimens during the acute phase of infection. The lowest concentration of SARS-CoV-2 viral copies this assay can detect is 250 copies / mL. A negative result does not preclude SARS-CoV-2 infection and should not be used as the sole basis for treatment or other patient management decisions.  A negative result may occur with improper specimen collection / handling, submission of specimen other than nasopharyngeal swab, presence of viral mutation(s) within the areas targeted by this assay, and inadequate number of viral copies (<250 copies / mL). A negative result must be combined with clinical observations, patient history, and epidemiological information.  Fact Sheet for Patients:   StrictlyIdeas.no  Fact Sheet for Healthcare Providers: BankingDealers.co.za  This test is not yet approved or  cleared by the [place] FDA and has been authorized for detection and/or diagnosis of SARS-CoV-2 by FDA under an Emergency Use Authorization (EUA).  This EUA will remain in effect (meaning this test can be used) for the duration of the COVID-19 declaration under Section 564(b)(1) of the Act, 21 U.S.C. section 360bbb-3(b)(1), unless the authorization is terminated or revoked sooner.  Performed at [facility], [address] [place], [place] [address]     Coagulation Studies: No results for input(s): LABPROT, INR in the last 72 hours.  Urinalysis: No results for input(s): COLORURINE, LABSPEC, PHURINE, GLUCOSEU, HGBUR, BILIRUBINUR, KETONESUR, PROTEINUR, UROBILINOGEN, NITRITE, LEUKOCYTESUR in the last 72 hours.  Invalid input(s): APPERANCEUR    Imaging: MR FOOT LEFT WO CONTRAST  Result Date: 12/08/2019 CLINICAL DATA:   Question of osteomyelitis of the second toe diabetic foot infection EXAM: MRI OF THE LEFT FOOT WITHOUT CONTRAST TECHNIQUE: Multiplanar, multisequence MR imaging of the left was performed. No intravenous contrast was administered. COMPARISON:  December 07, 2019 radiograph FINDINGS: Bones/Joint/Cartilage There is cortical irregularity with erosive type changes seen through the distal second phalanx. There is T2 hyperintense signal with T1 hypointensity seen throughout the distal phalanx. There is increased T2 hyperintense signal seen within the middle phalanx of the second digit without T1 hypointensity or cortical irregularity. Erosive type change or prior osteotomies are seen at the fourth and third MTP joints. There is also mildly increased signal seen within the metatarsals and midfoot without associated T1 hypointensity. Ligaments The Lisfranc ligaments appear to be intact. Muscles and Tendons Fatty atrophy with increased signal seen throughout the muscles of the forefoot. The flexor and extensor tendons appear to be grossly intact. Soft tissues Area  of ulceration seen overlying distal second phalanx with diffuse soft tissue edema and skin thickening. No loculated fluid collection or sinus tract is seen. There is mild dorsal soft tissue swelling seen over the forefoot. IMPRESSION: 1. Area of ulceration over the distal second digit with findings of acute osteomyelitis involving the distal phalanx. No loculated fluid collections or sinus tract. 2. Reactive marrow edema within the middle phalanx of the second digit and midfoot. Electronically Signed   By: Prudencio Pair M.D.   On: 12/08/2019 21:54   PERIPHERAL VASCULAR CATHETERIZATION  Result Date: 12/10/2019 See op note    Medications:   . sodium chloride    . dextrose 50 mL/hr at 12/10/19 1216  . dextrose 5 % and 0.9% NaCl Stopped (12/10/19 1216)  . piperacillin-tazobactam (ZOSYN)  IV     . amLODipine  5 mg Oral Q0600  . aspirin EC  81 mg Oral Daily  .  carvedilol  6.25 mg Oral BID WC  . Chlorhexidine Gluconate Cloth  6 each Topical Daily  . dextrose      . epoetin (EPOGEN/PROCRIT) injection  4,000 Units Intravenous Q T,Th,Sa-HD  . feeding supplement (NEPRO CARB STEADY)  237 mL Oral TID BM  . fentaNYL      . heparin  5,000 Units Subcutaneous Q8H  . heparin sodium (porcine)      . hydrALAZINE  25 mg Oral Q8H  . insulin aspart  0-5 Units Subcutaneous QHS  . insulin aspart  0-6 Units Subcutaneous TID WC  . mouth rinse  15 mL Mouth Rinse BID  . melatonin  2.5 mg Oral QHS  . midazolam      . multivitamin  1 tablet Oral QHS  . pantoprazole  40 mg Oral Daily  . sacubitril-valsartan  1 tablet Oral BID  . sertraline  100 mg Oral Daily  . sodium chloride flush  3 mL Intravenous Q12H   sodium chloride, acetaminophen, HYDROmorphone (DILAUDID) injection, ondansetron (ZOFRAN) IV, sodium chloride flush  Assessment/ Plan:  59 y.o. male with diabetes, hypertension, history of stroke with right-sided weakness, history of alcohol abuse, aortic atherosclerosis, emphysema was admitted on 12/06/2019 for  Principal Problem:   Acute respiratory failure with hypoxia (HCC) Active Problems:   Uncontrolled diabetes mellitus (HCC)   Alcohol abuse   HTN (hypertension)   CVA (cerebral vascular accident) ([place])   Chronic osteomyelitis of toe of left foot ([place])   Diabetic foot infection ([place])   Acute pulmonary edema ([place])  Acute respiratory failure with hypoxia ([place]) [J96.01] ESRD on hemodialysis ([place]) [N18.6, Z99.2] Other hypervolemia [E87.79]  #. ESRD with volume overload Patient underwent emergent hemodialysis Sunday night.  2500 cc of volume was removed Right arm AV graft placed by Dr. Delana Meyer November 25, 2019. Patient seen during dialysis Tolerating well   #. Anemia of CKD  Lab Results  Component Value Date   HGB 8.4 (L) 12/09/2019   Low dose EPO with HD  #. Secondary hyperparathyroidism of renal origin N 25.81   No results found for:  PTH Lab Results  Component Value Date   PHOS 3.4 08/25/2019   Monitor calcium and phos level during this admission   #. Diabetes type 2 with CKD Hemoglobin A1C (%)  Date Value  01/17/2014 < 3.5 (L)   Hgb A1c MFr Bld (%)  Date Value  12/10/2019 7.4 (H)  Poorly controlled Recent admission last month for hyperosmolar hyperglycemic state.  Hypoglycemic this time.  #Peripheral vascular disease Nonhealing toe ulcers.  MRI positive for  osteomyelitis. Angiogram done 12/10/19 Vascular and podiatry team are following.   LOS: 4 [name] 7/29/20213:52 PM  [facility] [place], [place]

## 2019-12-10 NOTE — H&P (View-Only) (Signed)
Day of Surgery   Subjective/Chief Complaint: Patient seen.  In dialysis.  Still some pain in the toe.   Objective: Vital signs in last 24 hours: Temp:  [97.4 F (36.3 C)-98.6 F (37 C)] 97.9 F (36.6 C) (07/29 1800) Pulse Rate:  [71-92] 84 (07/29 1715) Resp:  [13-23] 22 (07/29 1545) BP: (114-160)/(68-89) 145/77 (07/29 1545) SpO2:  [90 %-100 %] 100 % (07/29 1715) Last BM Date: 12/10/19  Intake/Output from previous day: 07/28 0701 - 07/29 0700 In: 268.3 [P.O.:240; IV Piggyback:28.3] Out: -  Intake/Output this shift: No intake/output data recorded.  Light bandage on the second toe.  Wound not assessed today.  Lab Results:  Recent Labs    12/09/19 0535  WBC 5.3  HGB 8.4*  HCT 25.2*  PLT 154   BMET Recent Labs    12/10/19 0427 12/10/19 0921  NA 138  --   K 3.8  --   CL 102  --   CO2 26  --   GLUCOSE 67* 74  BUN 29*  --   CREATININE 4.03*  --   CALCIUM 8.1*  --    PT/INR No results for input(s): LABPROT, INR in the last 72 hours. ABG No results for input(s): PHART, HCO3 in the last 72 hours.  Invalid input(s): PCO2, PO2  Studies/Results: MR FOOT LEFT WO CONTRAST  Result Date: 12/08/2019 CLINICAL DATA:  Question of osteomyelitis of the second toe diabetic foot infection EXAM: MRI OF THE LEFT FOOT WITHOUT CONTRAST TECHNIQUE: Multiplanar, multisequence MR imaging of the left was performed. No intravenous contrast was administered. COMPARISON:  December 07, 2019 radiograph FINDINGS: Bones/Joint/Cartilage There is cortical irregularity with erosive type changes seen through the distal second phalanx. There is T2 hyperintense signal with T1 hypointensity seen throughout the distal phalanx. There is increased T2 hyperintense signal seen within the middle phalanx of the second digit without T1 hypointensity or cortical irregularity. Erosive type change or prior osteotomies are seen at the fourth and third MTP joints. There is also mildly increased signal seen within the  metatarsals and midfoot without associated T1 hypointensity. Ligaments The Lisfranc ligaments appear to be intact. Muscles and Tendons Fatty atrophy with increased signal seen throughout the muscles of the forefoot. The flexor and extensor tendons appear to be grossly intact. Soft tissues Area of ulceration seen overlying distal second phalanx with diffuse soft tissue edema and skin thickening. No loculated fluid collection or sinus tract is seen. There is mild dorsal soft tissue swelling seen over the forefoot. IMPRESSION: 1. Area of ulceration over the distal second digit with findings of acute osteomyelitis involving the distal phalanx. No loculated fluid collections or sinus tract. 2. Reactive marrow edema within the middle phalanx of the second digit and midfoot. Electronically Signed   By: Prudencio Pair M.D.   On: 12/08/2019 21:54   PERIPHERAL VASCULAR CATHETERIZATION  Result Date: 12/10/2019 See op note   Anti-infectives: Anti-infectives (From admission, onward)   Start     Dose/Rate Route Frequency Ordered Stop   12/10/19 1400  piperacillin-tazobactam (ZOSYN) IVPB 2.25 g     Discontinue     2.25 g 100 mL/hr over 30 Minutes Intravenous Every 8 hours 12/10/19 1056     12/10/19 1045  ceFAZolin (ANCEF) IVPB 1 g/50 mL premix       Note to Pharmacy: To be given in specials   1 g 100 mL/hr over 30 Minutes Intravenous  Once 12/10/19 1032 12/10/19 1247   12/09/19 2200  piperacillin-tazobactam (ZOSYN) IVPB 3.375 g  Status:  Discontinued        3.375 g 12.5 mL/hr over 240 Minutes Intravenous Every 8 hours 12/09/19 1944 12/10/19 1056   12/06/19 1530  cefTRIAXone (ROCEPHIN) 2 g in sodium chloride 0.9 % 100 mL IVPB        2 g 200 mL/hr over 30 Minutes Intravenous  Once 12/06/19 1515 12/06/19 1633   12/06/19 1530  azithromycin (ZITHROMAX) 500 mg in sodium chloride 0.9 % 250 mL IVPB        500 mg 250 mL/hr over 60 Minutes Intravenous  Once 12/06/19 1515 12/06/19 1806      Assessment/Plan: s/p  Procedure(s): Lower Extremity Angiography (Left) Assessment: Osteomyelitis left second toe with severe peripheral vascular disease.   Plan: Discussed with the patient the results of his vascular procedure performed earlier today.  Discussed that there is minimal blood flow making its way down into his foot even though some of the larger vessels in the leg were able to be opened up some.  Discussed that at this point I still think we need to proceed with amputation of the second toe to try to get rid of the infection but that no guarantees could be given as to healing of the amputation and he is still at significant risk for the need for a higher level amputation up into the leg.  The same conversation was also carried out by phone with his wife.  Questions were invited and answered.  At this point we will plan for amputation of the left second toe tomorrow morning.  Patient will be n.p.o. after midnight.  Consent form for amputation left second toe.  Patient's wife said she would be coming by the hospital later tonight and would be able to discuss this more with him.  Plan for surgery tomorrow morning for amputation of the left second toe.  LOS: 4 days    [name] 12/10/2019

## 2019-12-11 ENCOUNTER — Inpatient Hospital Stay: Payer: Medicare Other | Admitting: Anesthesiology

## 2019-12-11 ENCOUNTER — Encounter
Admission: EM | Disposition: A | Discharge status: Home / Self Care | Payer: Self-pay | Source: Home / Self Care | Attending: Internal Medicine

## 2019-12-11 ENCOUNTER — Encounter: Payer: Self-pay | Admitting: Internal Medicine

## 2019-12-11 HISTORY — PX: AMPUTATION TOE: SHX6595

## 2019-12-11 LAB — CULTURE, BLOOD (ROUTINE X 2)
Culture: NO GROWTH
Culture: NO GROWTH

## 2019-12-11 LAB — POCT I-STAT, CHEM 8
BUN: 16 mg/dL (ref 6–20)
Calcium, Ion: 1.15 mmol/L (ref 1.15–1.40)
Chloride: 101 mmol/L (ref 98–111)
Creatinine, Ser: 3.1 mg/dL — ABNORMAL HIGH (ref 0.61–1.24)
Glucose, Bld: 359 mg/dL — ABNORMAL HIGH (ref 70–99)
HCT: 26 % — ABNORMAL LOW (ref 39.0–52.0)
Hemoglobin: 8.8 g/dL — ABNORMAL LOW (ref 13.0–17.0)
Potassium: 4.4 mmol/L (ref 3.5–5.1)
Sodium: 139 mmol/L (ref 135–145)
TCO2: 24 mmol/L (ref 22–32)

## 2019-12-11 LAB — GLUCOSE, CAPILLARY
Glucose-Capillary: 335 mg/dL — ABNORMAL HIGH (ref 70–99)
Glucose-Capillary: 328 mg/dL — ABNORMAL HIGH (ref 70–99)
Glucose-Capillary: 309 mg/dL — ABNORMAL HIGH (ref 70–99)
Glucose-Capillary: 470 mg/dL — ABNORMAL HIGH (ref 70–99)
Glucose-Capillary: 389 mg/dL — ABNORMAL HIGH (ref 70–99)

## 2019-12-11 SURGERY — AMPUTATION, TOE
Anesthesia: General | Site: Toe | Laterality: Left

## 2019-12-11 MED ORDER — HYDROCODONE-ACETAMINOPHEN 5-325 MG PO TABS
1.0000 | ORAL_TABLET | ORAL | Status: DC | PRN
  Administered 2019-12-11 – 2019-12-13 (×5): 1 via ORAL
  Filled 2019-12-11 (×5): qty 1

## 2019-12-11 MED ORDER — FENTANYL CITRATE (PF) 100 MCG/2ML IJ SOLN: 25.0000 ug | INTRAMUSCULAR | Status: DC | PRN

## 2019-12-11 MED ORDER — PROPOFOL 10 MG/ML IV BOLUS
INTRAVENOUS | Status: DC | PRN
  Administered 2019-12-11: 40 mg via INTRAVENOUS

## 2019-12-11 MED ORDER — MIDAZOLAM HCL 2 MG/2ML IJ SOLN
INTRAMUSCULAR | Status: DC | PRN
  Administered 2019-12-11: 2 mg via INTRAVENOUS

## 2019-12-11 MED ORDER — PROPOFOL 500 MG/50ML IV EMUL
INTRAVENOUS | Status: DC | PRN
  Administered 2019-12-11: 65 ug/kg/min via INTRAVENOUS

## 2019-12-11 MED ORDER — BUPIVACAINE HCL 0.5 % IJ SOLN
INTRAMUSCULAR | Status: DC | PRN
  Administered 2019-12-11: 10 mL

## 2019-12-11 MED ORDER — ONDANSETRON HCL 4 MG/2ML IJ SOLN: 4.0000 mg | Freq: Once | INTRAMUSCULAR | Status: DC | PRN

## 2019-12-11 MED ORDER — FENTANYL CITRATE (PF) 100 MCG/2ML IJ SOLN
INTRAMUSCULAR | Status: AC
  Filled 2019-12-11: qty 2

## 2019-12-11 MED ORDER — FENTANYL CITRATE (PF) 100 MCG/2ML IJ SOLN
INTRAMUSCULAR | Status: DC | PRN
  Administered 2019-12-11: 25 ug via INTRAVENOUS

## 2019-12-11 MED ORDER — PROPOFOL 500 MG/50ML IV EMUL
INTRAVENOUS | Status: AC
  Filled 2019-12-11: qty 50

## 2019-12-11 MED ORDER — INSULIN GLARGINE 100 UNIT/ML ~~LOC~~ SOLN
4.0000 [IU] | Freq: Once | SUBCUTANEOUS | Status: AC
  Administered 2019-12-11: 12:00:00 4 [IU] via SUBCUTANEOUS
  Filled 2019-12-11: qty 0.04

## 2019-12-11 MED ORDER — INSULIN GLARGINE 100 UNIT/ML ~~LOC~~ SOLN
5.0000 [IU] | Freq: Every day | SUBCUTANEOUS | Status: DC
  Administered 2019-12-11 – 2019-12-12 (×2): 5 [IU] via SUBCUTANEOUS
  Filled 2019-12-11 (×3): qty 0.05

## 2019-12-11 MED ORDER — MIDAZOLAM HCL 2 MG/2ML IJ SOLN
INTRAMUSCULAR | Status: AC
  Filled 2019-12-11: qty 2

## 2019-12-11 SURGICAL SUPPLY — 48 items
BLADE MED AGGRESSIVE (BLADE) ×1 IMPLANT
BLADE OSC/SAGITTAL MD 5.5X18 (BLADE) ×3 IMPLANT
BLADE SURG 15 STRL LF DISP TIS (BLADE) ×2 IMPLANT
BLADE SURG 15 STRL SS (BLADE) ×6
BLADE SURG MINI STRL (BLADE) ×3 IMPLANT
BNDG CONFORM 2 STRL LF (GAUZE/BANDAGES/DRESSINGS) ×3 IMPLANT
BNDG ELASTIC 4X5.8 VLCR STR LF (GAUZE/BANDAGES/DRESSINGS) ×3 IMPLANT
BNDG ESMARK 4X12 TAN STRL LF (GAUZE/BANDAGES/DRESSINGS) ×3 IMPLANT
BNDG GAUZE 4.5X4.1 6PLY STRL (MISCELLANEOUS) ×3 IMPLANT
CANISTER SUCT 1200ML W/VALVE (MISCELLANEOUS) ×3 IMPLANT
CLOSURE WOUND 1/4X4 (GAUZE/BANDAGES/DRESSINGS)
COVER WAND RF STERILE (DRAPES) ×3 IMPLANT
CUFF TOURN 18 STER (MISCELLANEOUS) ×1 IMPLANT
CUFF TOURN DUAL PL 12 NO SLV (MISCELLANEOUS) ×1 IMPLANT
DRAPE EXTREMITY 106X87X128.5 (DRAPES) ×2 IMPLANT
DRAPE FLUOR MINI C-ARM 54X84 (DRAPES) ×1 IMPLANT
DURAPREP 26ML APPLICATOR (WOUND CARE) ×3 IMPLANT
ELECT REM PT RETURN 9FT ADLT (ELECTROSURGICAL) ×3
ELECTRODE REM PT RTRN 9FT ADLT (ELECTROSURGICAL) ×1 IMPLANT
GAUZE SPONGE 4X4 12PLY STRL (GAUZE/BANDAGES/DRESSINGS) ×3 IMPLANT
GAUZE XEROFORM 1X8 LF (GAUZE/BANDAGES/DRESSINGS) ×3 IMPLANT
GLOVE BIO SURGEON STRL SZ7.5 (GLOVE) ×3 IMPLANT
GLOVE INDICATOR 8.0 STRL GRN (GLOVE) ×3 IMPLANT
GOWN STRL REUS W/ TWL LRG LVL3 (GOWN DISPOSABLE) ×2 IMPLANT
GOWN STRL REUS W/TWL LRG LVL3 (GOWN DISPOSABLE) ×6
HANDPIECE VERSAJET DEBRIDEMENT (MISCELLANEOUS) ×1 IMPLANT
KIT TURNOVER KIT A (KITS) ×3 IMPLANT
LABEL OR SOLS (LABEL) ×3 IMPLANT
NDL FILTER BLUNT 18X1 1/2 (NEEDLE) ×1 IMPLANT
NDL HYPO 25X1 1.5 SAFETY (NEEDLE) ×2 IMPLANT
NEEDLE FILTER BLUNT 18X 1/2SAF (NEEDLE) ×2
NEEDLE FILTER BLUNT 18X1 1/2 (NEEDLE) ×1 IMPLANT
NEEDLE HYPO 25X1 1.5 SAFETY (NEEDLE) ×6 IMPLANT
NS IRRIG 500ML POUR BTL (IV SOLUTION) ×3 IMPLANT
PACK EXTREMITY (MISCELLANEOUS) ×3 IMPLANT
SOL .9 NS 3000ML IRR  AL (IV SOLUTION) ×3
SOL .9 NS 3000ML IRR UROMATIC (IV SOLUTION) ×1 IMPLANT
SOL PREP PVP 2OZ (MISCELLANEOUS) ×3
SOLUTION PREP PVP 2OZ (MISCELLANEOUS) ×1 IMPLANT
STOCKINETTE STRL 6IN 960660 (GAUZE/BANDAGES/DRESSINGS) ×3 IMPLANT
STRIP CLOSURE SKIN 1/4X4 (GAUZE/BANDAGES/DRESSINGS) ×1 IMPLANT
SUT ETHILON 3-0 FS-10 30 BLK (SUTURE) ×3
SUT ETHILON 4-0 (SUTURE)
SUT ETHILON 4-0 FS2 18XMFL BLK (SUTURE)
SUTURE EHLN 3-0 FS-10 30 BLK (SUTURE) ×1 IMPLANT
SUTURE ETHLN 4-0 FS2 18XMF BLK (SUTURE) IMPLANT
SWAB DUAL CULTURE TRANS RED ST (MISCELLANEOUS) ×1 IMPLANT
SYR 10ML LL (SYRINGE) ×3 IMPLANT

## 2019-12-11 NOTE — Consult Note (Signed)
Pharmacy Antibiotic Note  [name] is a 59 y.o. male admitted on 12/06/2019 with gangrene.  Pharmacy has been consulted for pip/tazo dosing. Patient with h/o ESRD on dialysis 3 times a week.  Plan: Day 3 of Zosyn Will continue Zosyn to 2.25g IV q8h, adjusted for hemodialysis  Height: 5\' 11"  (180.3 cm) Weight: 57.3 kg (126 lb 6.4 oz) IBW/kg (Calculated) : 75.3  Temp (24hrs), Avg:98.2 F (36.8 C), Min:97.2 F (36.2 C), Max:98.9 F (37.2 C)  Recent Labs  Lab 12/06/19 1248 12/06/19 1354 12/06/19 2100 12/07/19 0639 12/09/19 0535 12/10/19 0427 12/11/19 0933  WBC 7.0  --   --  4.4 5.3  --   --   CREATININE 3.85*  --   --  2.62*  --  4.03* 3.10*  LATICACIDVEN  --  2.3* 1.4  --   --   --   --     Estimated Creatinine Clearance: 20.5 mL/min (A) (by C-G formula based on SCr of 3.1 mg/dL (H)).    Allergies  Allergen Reactions  . Ferrous Gluconate Nausea And Vomiting  . Other     Antimicrobials this admission: 7/28 pip/tazo >>   Dose adjustments this admission: 7/29 Zosyn 3.375g IV q8h EI > 2.25g IV q8h  Microbiology results: 7/25 BCx: pending  Thank you for allowing pharmacy to be a part of this patient's care.  Paulina Fusi, PharmD, BCPS 12/11/2019 3:32 PM

## 2019-12-11 NOTE — Op Note (Signed)
Date of operation: 12/11/2019.  Surgeon: [name] D.P.M.  Preoperative diagnosis: Osteomyelitis left second toe.  Postoperative diagnosis: Same.  Procedure: Amputation left second toe.  Anesthesia: Local MAC.  Hemostasis: None.  Estimated blood loss: 5 cc.  Pathology: Left second toe.  Cultures: Bone culture distal phalanx left second toe.  Complications: None apparent.  Operative indications: This is a 59 year old male with diabetes and peripheral vascular disease with development of osteomyelitis in the second toe confirmed by x-ray and MRI.  Decision made for amputation of the second toe.  Operative procedure: Patient was taken to the operating room and placed on the table in the supine position.  Following satisfactory sedation the left second ray was anesthetized with 10 cc of 0.5% Marcaine plain.  The left foot was then prepped and draped in usual sterile fashion.  Attention was then directed to the distal aspect where an elliptical incision was made from dorsal to plantar around the medial and lateral base of the second toe.  Incision carried down to the level of the bone and then proximally back to the metatarsal phalangeal joint where the toe was disarticulated and removed in toto.  The wound was flushed with copious amounts of sterile saline and closed using 4-0 nylon vertical mattress and simple interrupted sutures.  Xeroform 4 x 4's Kerlix ABD and a second Kerlix and Ace wrap applied to the left foot.  Patient tolerated the procedure and anesthesia well was transported to the PACU with vital signs stable and in good condition.

## 2019-12-11 NOTE — Progress Notes (Signed)
[place] INFECTIOUS DISEASE PROGRESS NOTE Date of Admission:  12/06/2019     ID: [name] is a 59 y.o. male with osteomyelitis Principal Problem:   Acute respiratory failure with hypoxia ([place]) Active Problems:   Uncontrolled diabetes mellitus ([place])   Alcohol abuse   HTN (hypertension)   CVA (cerebral vascular accident) ([place])   Chronic osteomyelitis of toe of left foot ([place])   Diabetic foot infection ([place])   Acute pulmonary edema ([facility])  Subjective: S/p amputation L 2nd toe today, Angio yest. No fevers. Denies foot pain  ROS  Eleven systems are reviewed and negative except per hpi  Medications:  Antibiotics Given (last 72 hours)    Date/Time Action Medication Dose Rate   12/09/19 2053 New Bag/Given   piperacillin-tazobactam (ZOSYN) IVPB 3.375 g 3.375 g 12.5 mL/hr   12/10/19 0456 New Bag/Given   piperacillin-tazobactam (ZOSYN) IVPB 3.375 g 3.375 g 12.5 mL/hr   12/10/19 1217 New Bag/Given   ceFAZolin (ANCEF) IVPB 1 g/50 mL premix     12/10/19 2202 New Bag/Given   piperacillin-tazobactam (ZOSYN) IVPB 2.25 g 2.25 g 100 mL/hr   12/11/19 0552 New Bag/Given   piperacillin-tazobactam (ZOSYN) IVPB 2.25 g 2.25 g 100 mL/hr   12/11/19 1358 New Bag/Given   piperacillin-tazobactam (ZOSYN) IVPB 2.25 g 2.25 g 100 mL/hr     . amLODipine  5 mg Oral Q0600  . aspirin EC  81 mg Oral Daily  . carvedilol  6.25 mg Oral BID WC  . Chlorhexidine Gluconate Cloth  6 each Topical Daily  . epoetin (EPOGEN/PROCRIT) injection  4,000 Units Intravenous Q T,Th,Sa-HD  . feeding supplement (NEPRO CARB STEADY)  237 mL Oral TID BM  . heparin  5,000 Units Subcutaneous Q8H  . hydrALAZINE  25 mg Oral Q8H  . insulin aspart  0-5 Units Subcutaneous QHS  . insulin aspart  0-6 Units Subcutaneous TID WC  . insulin glargine  5 Units Subcutaneous QHS  . mouth rinse  15 mL Mouth Rinse BID  . melatonin  2.5 mg Oral QHS  . multivitamin  1 tablet Oral QHS  . pantoprazole  40 mg Oral Daily  .  sacubitril-valsartan  1 tablet Oral BID  . sertraline  100 mg Oral Daily  . sodium chloride flush  3 mL Intravenous Q12H    Objective: Vital signs in last 24 hours: Temp:  [97.2 F (36.2 C)-98.9 F (37.2 C)] 98 F (36.7 C) (07/30 1209) Pulse Rate:  [71-92] 84 (07/30 1209) Resp:  [14-30] 18 (07/30 1209) BP: (127-153)/(58-109) 153/86 (07/30 1354) SpO2:  [81 %-100 %] 97 % (07/30 1209) Physical Exam  Constitutional: He is oriented to person, place, and time. Thin HENT: anicteric Mouth/Throat: Oropharynx is clear and moist. Poor dentition Cardiovascular: Normal rate, regular rhythm Pulmonary/Chest: Effort normal and breath sounds normal. No respiratory distress. He has no wheezes.  Abdominal: Soft. Bowel sounds are normal. Neurological: He is alert and oriented to person, place, and time.  Skin: Skin is warm and dry. L foot wrapped Psychiatric: He has a normal mood and affect. His behavior is normal.     Lab Results Recent Labs    12/09/19 0535 12/10/19 0427 12/11/19 0933  WBC 5.3  --   --   HGB 8.4*  --  8.8*  HCT 25.2*  --  26.0*  NA  --  138 139  K  --  3.8 4.4  CL  --  102 101  CO2  --  26  --   BUN  --  29* 16  CREATININE  --  4.03* 3.10*    Microbiology: Results for orders placed or performed during the hospital encounter of 12/06/19  Culture, blood (routine x 2)     Status: None   Collection Time: 12/06/19  1:54 PM   Specimen: BLOOD  Result Value Ref Range Status   Specimen Description BLOOD LAC  Final   Special Requests   Final    BOTTLES DRAWN AEROBIC AND ANAEROBIC Blood Culture results may not be optimal due to an excessive volume of blood received in culture bottles   Culture   Final    NO GROWTH 5 DAYS Performed at [facility], [address] [place], [place] [address]    Report Status 12/11/2019 FINAL  Final  Culture, blood (routine x 2)     Status: None   Collection Time: 12/06/19  1:54 PM   Specimen: BLOOD  Result Value Ref Range  Status   Specimen Description BLOOD L BICEP  Final   Special Requests   Final    BOTTLES DRAWN AEROBIC AND ANAEROBIC Blood Culture results may not be optimal due to an excessive volume of blood received in culture bottles   Culture   Final    NO GROWTH 5 DAYS Performed at [facility], [address] [place], [place] [address]    Report Status 12/11/2019 FINAL  Final  SARS Coronavirus 2 by RT PCR (hospital order, performed in [place] hospital lab) Nasopharyngeal Nasopharyngeal Swab     Status: None   Collection Time: 12/06/19  1:54 PM   Specimen: Nasopharyngeal Swab  Result Value Ref Range Status   SARS Coronavirus 2 NEGATIVE NEGATIVE Final    Comment: (NOTE) SARS-CoV-2 target nucleic acids are NOT DETECTED.  The SARS-CoV-2 RNA is generally detectable in upper and lower respiratory specimens during the acute phase of infection. The lowest concentration of SARS-CoV-2 viral copies this assay can detect is 250 copies / mL. A negative result does not preclude SARS-CoV-2 infection and should not be used as the sole basis for treatment or other patient management decisions.  A negative result may occur with improper specimen collection / handling, submission of specimen other than nasopharyngeal swab, presence of viral mutation(s) within the areas targeted by this assay, and inadequate number of viral copies (<250 copies / mL). A negative result must be combined with clinical observations, patient history, and epidemiological information.  Fact Sheet for Patients:   StrictlyIdeas.no  Fact Sheet for Healthcare Providers: BankingDealers.co.za  This test is not yet approved or  cleared by the [place] FDA and has been authorized for detection and/or diagnosis of SARS-CoV-2 by FDA under an Emergency Use Authorization (EUA).  This EUA will remain in effect (meaning this test can be used) for the duration of the COVID-19  declaration under Section 564(b)(1) of the Act, 21 U.S.C. section 360bbb-3(b)(1), unless the authorization is terminated or revoked sooner.  Performed at [facility], [address] [place], [place] [address]     Studies/Results: PERIPHERAL VASCULAR CATHETERIZATION  Result Date: 12/10/2019 See op note   Assessment/Plan: 49 with a history of ESRD, DM, HTN presents from home with altered mental status. Found to have osteo and now s/p angio and amputation Culture and path pending - final recs based on these Continue zosyn pending above results Dr Delaine Lame will see Monday.  Thank you very much for the consult. Will follow with you.  Leonel Ramsay   12/11/2019, 3:16 PM

## 2019-12-11 NOTE — Progress Notes (Signed)
Patient off unit via bed in stable condition to OR

## 2019-12-11 NOTE — Progress Notes (Signed)
Inpatient Diabetes Program Recommendations  AACE/ADA: New Consensus Statement on Inpatient Glycemic Control (2015)  Target Ranges:  Prepandial:   less than 140 mg/dL      Peak postprandial:   less than 180 mg/dL (1-2 hours)      Critically ill patients:  140 - 180 mg/dL   Results for BRENNEN, CAMPER (MRN [id number]) as of 12/11/2019 07:42  Ref. Range 12/09/2019 08:02 12/09/2019 11:21 12/09/2019 15:48 12/09/2019 20:48 12/09/2019 22:24  Glucose-Capillary Latest Ref Range: 70 - 99 mg/dL 149 (H) 298 (H)  3 units NOVOLOG  384 (H)  5 units NOVOLOG  241 (H)    275 (H)  3 units NOVOLOG +  10 units LANTUS   Results for ISAID, SALVIA (MRN [id number]) as of 12/11/2019 07:42  Ref. Range 12/10/2019 07:53 12/10/2019 09:12 12/10/2019 11:32 12/10/2019 11:32 12/10/2019 11:57 12/10/2019 13:21 12/10/2019 14:56 12/10/2019 18:29 12/10/2019 21:13  Glucose-Capillary Latest Ref Range: 70 - 99 mg/dL 30 (LL) 87 42 (LL) 44 (LL) 149 (H) 102 (H) 137 (H) 109 (H) 229 (H)  2 units NOVOLOG    Results for JANIS, CUFFE (MRN [id number]) as of 12/11/2019 07:42  Ref. Range 12/11/2019 07:34  Glucose-Capillary Latest Ref Range: 70 - 99 mg/dL 335 (H)     Home DM Meds: Lantus 20 units Daily       Humalog 0-15 units TID  Current Orders: Novolog 0-6 units TID ac + hs     MD- Note patient had Severe Hypoglycemia yesterday AM after receiving 10 units Lantus the night prior.  Note Lantus discontinued.    CBG 335 this AM.  Please consider adding back Lantus 5 units daily    --Will follow patient during hospitalization--  Wyn Quaker RN, MSN, CDE Diabetes Coordinator Inpatient Glycemic Control Team Team Pager: [phone] (8a-5p)

## 2019-12-11 NOTE — Progress Notes (Signed)
Returned from [employer] in stable condition.

## 2019-12-11 NOTE — Anesthesia Preprocedure Evaluation (Signed)
Anesthesia Evaluation  Patient identified by MRN, date of birth, ID band Patient awake    Reviewed: Allergy & Precautions, NPO status , Patient's Chart, lab work & pertinent test results  History of Anesthesia Complications Negative for: history of anesthetic complications  Airway Mallampati: II  TM Distance: >3 FB Neck ROM: Full    Dental  (+) Edentulous Upper, Edentulous Lower   Pulmonary neg sleep apnea, neg COPD, Current Smoker and Patient abstained from smoking.,    breath sounds clear to auscultation- rhonchi (-) wheezing      Cardiovascular hypertension, Pt. on medications + Peripheral Vascular Disease  (-) CAD, (-) Past MI, (-) Cardiac Stents and (-) CABG  Rhythm:Regular Rate:Normal - Systolic murmurs and - Diastolic murmurs Echo 08/23/85: MODERATE LV SYSTOLIC DYSFUNCTION (See above)  WITH MILD LVH  NORMAL RIGHT VENTRICULAR SYSTOLIC FUNCTION  MILD VALVULAR REGURGITATION (See above)  NO VALVULAR STENOSIS  MILD to MODERATE MR  MILD AR, TR, PR  EF 35-40%     Neuro/Psych neg Seizures PSYCHIATRIC DISORDERS Depression CVA    GI/Hepatic GERD  ,(+)     substance abuse  alcohol use,   Endo/Other  diabetes, Insulin Dependent  Renal/GU ESRF and DialysisRenal disease     Musculoskeletal negative musculoskeletal ROS (+)   Abdominal (+) - obese,   Peds negative pediatric ROS (+)  Hematology  (+) Blood dyscrasia, anemia ,   Anesthesia Other Findings Past Medical History: No date: Chronic kidney disease No date: Diabetes mellitus without complication (HCC) No date: ETOH abuse No date: GERD (gastroesophageal reflux disease) No date: Hyperlipidemia No date: Hypertension No date: Stroke [facility])   Reproductive/Obstetrics                             Anesthesia Physical  Anesthesia Plan  ASA: IV  Anesthesia Plan: General   Post-op Pain Management:    Induction: Intravenous  PONV  Risk Score and Plan: 0 and Ondansetron and TIVA  Airway Management Planned: Nasal Cannula  Additional Equipment:   Intra-op Plan:   Post-operative Plan:   Informed Consent: I have reviewed the patients History and Physical, chart, labs and discussed the procedure including the risks, benefits and alternatives for the proposed anesthesia with the patient or authorized representative who has indicated his/her understanding and acceptance.     Dental advisory given  Plan Discussed with: CRNA and Anesthesiologist  Anesthesia Plan Comments:         Anesthesia Quick Evaluation

## 2019-12-11 NOTE — Progress Notes (Signed)
PROGRESS NOTE    [name]  DTO:[id number] DOB: 02/18/1961 DOA: 12/06/2019 PCP: [name], MD   Brief Narrative: [name] is a 59 y.o. male with a history of uncontrolled DM, poorly controlled HTN, ESRD on HD, previous alcohol use no use x1 year. Patient presented secondary to altered mental status and found to be hypoglycemic and severely fluid overloaded requiring emergent HD.   Assessment & Plan:   Principal Problem:   Acute respiratory failure with hypoxia (HCC) Active Problems:   Uncontrolled diabetes mellitus (HCC)   Alcohol abuse   HTN (hypertension)   CVA (cerebral vascular accident) ([place])   Chronic osteomyelitis of toe of left foot (HCC)   Diabetic foot infection ([place])   Acute pulmonary edema (HCC)   Acute respiratory failure with hypoxia Secondary to acute pulmonary edema from missed HD. Patient received emergent HD on the night of admission with improvement of symptoms and hypoxia. Patient also received lasix IV -Wean oxygen to room air as able -HD per nephrology -Patient to get dialysis again today 12/10/2019  Acute pulmonary edema Secondary to fluid overload. Improved with HD. -Nephrology recommendations for HD  ESRD on HD -HD per nephrology  Diabetic foot infection-status post amputation of the left second toe 12/11/2019. Per patient, he was followed as an outpatient for management of this foot infection for the last month. He was previously on antibiotics for which he has completed. Left second digit with purulent drainage and concern for possible osteomyelitis. Feet also with poor pulses. Procalcitonin rising; no leukocytosis or fevers. Blood cultures with no growth to date. X-ray concerning for osteomyelitis -MRI of left foot-area of ulceration over the distal second digit with findings of acute osteomyelitis involving the distal phalanx.  Reactive marrow edema within the middle phalanx of the second digit in the midfoot. -ABI of bilateral feet  -nondiagnostic ABI secondary to incompressible vascular calcifications.  Decreased toe brachial indicis consistent with at least moderate bilateral arterial occlusive disease at rest.  He was started on IV antibiotics yesterday by infectious disease as his procalcitonin's were high and he was scheduled to have toe amputation today.  Wheezing-patient to have dialysis today. Given Duonebs. Resolved.  Essential hypertension Patient is on amlodipine, Coreg, Entresto, Hydralazine  Hypoglycemia Patient with a blood sugar of 21 on admission.  Patient is n.p.o. for the procedure.  I have ordered D10 and hold Lantus.  Will reassess in a.m. CBG (last 3)  Recent Labs    12/11/19 0734 12/11/19 1107 12/11/19 1208  GLUCAP 335* 328* 309*      Diabetes mellitus, type 2 Patient is on Humalog TID with meals, Lantus 20 units daily as an outpatient which is on hold due to hypoglycemia and patient being n.p.o.  Will reassess in the morning and restart Lantus.   Acute metabolic encephalopathy Secondary to hypoglycemia. Resolved.  Depression -Continue sertraline  Malnutrition Unknown severity -Continue nutrition supplements  [name] Pancreatic pseudocyst -Outpatient follow-up  DVT prophylaxis: heparin subq Code Status:   Code Status: Full Code Family Communication: None at bedside. Wife on telephone Disposition Plan: Unknown patient now has osteomyelitis of the left second toe waiting on ID and vascular surgery evaluation.   Consultants:   Nephrology, vascular surgery  Procedures:   Hemodialysis  Antimicrobials:  None    Subjective: He is awake alert asking for food He denied any shortness of breath or cough to me when I saw him this morning Objective: Vitals:   12/11/19 1134 12/11/19 1150 12/11/19  1209 12/11/19 1354  BP: (!) 144/74 (!) 144/84 (!) 145/67 (!) 153/86  Pulse: 89 82 84   Resp: (!) 30 16 18    Temp:  97.9 F (36.6 C) 98 F (36.7 C)   TempSrc:    Oral   SpO2: 96% 98% 97%   Weight:      Height:        Intake/Output Summary (Last 24 hours) at 12/11/2019 1443 Last data filed at 12/11/2019 1434 Gross per 24 hour  Intake 534.59 ml  Output 1007 ml  Net -472.41 ml   Filed Weights   12/06/19 1241 12/07/19 1133  Weight: 72.6 kg 57.3 kg    Examination:  General exam: Appears calm and comfortable Respiratory system: Scattered rhonchi in both lung fields respiratory effort normal. Cardiovascular system: S1 & S2 heard, RRR. No murmurs, rubs, gallops or clicks. Right arm AV graft with palpable thrill and audible bruit Gastrointestinal system: Abdomen is nondistended, soft and nontender. No organomegaly or masses felt. Normal bowel sounds heard. Central nervous system: Alert and oriented. No focal neurological deficits. Musculoskeletal: Toe covered with dressing pictures noted with the left second toe gangrene Skin: No cyanosis. No rashes Psychiatry: Judgement and insight appear normal. Mood & affect appropriate.     Data Reviewed: I have personally reviewed following labs and imaging studies  CBC Lab Results  Component Value Date   WBC 5.3 12/09/2019   RBC 2.65 (L) 12/09/2019   HGB 8.8 (L) 12/11/2019   HCT 26.0 (L) 12/11/2019   MCV 95.1 12/09/2019   MCH 31.7 12/09/2019   PLT 154 12/09/2019   MCHC 33.3 12/09/2019   RDW 15.7 (H) 12/09/2019   LYMPHSABS 1.2 11/25/2019   MONOABS 0.3 11/25/2019   EOSABS 0.1 11/25/2019   BASOSABS 0.0 66/44/0347     Last metabolic panel Lab Results  Component Value Date   NA 139 12/11/2019   K 4.4 12/11/2019   CL 101 12/11/2019   CO2 26 12/10/2019   BUN 16 12/11/2019   CREATININE 3.10 (H) 12/11/2019   GLUCOSE 359 (H) 12/11/2019   GFRNONAA 15 (L) 12/10/2019   GFRAA 18 (L) 12/10/2019   CALCIUM 8.1 (L) 12/10/2019   PHOS 3.4 08/25/2019   PROT 7.4 12/06/2019   ALBUMIN 3.4 (L) 12/06/2019   LABGLOB 2.7 07/26/2018   AGRATIO 0.9 07/26/2018   BILITOT 0.7 12/06/2019   ALKPHOS 294 (H)  12/06/2019   AST 43 (H) 12/06/2019   ALT 38 12/06/2019   ANIONGAP 10 12/10/2019    CBG (last 3)  Recent Labs    12/11/19 0734 12/11/19 1107 12/11/19 1208  GLUCAP 335* 328* 309*     GFR: Estimated Creatinine Clearance: 20.5 mL/min (A) (by C-G formula based on SCr of 3.1 mg/dL (H)).  Coagulation Profile: No results for input(s): INR, PROTIME in the last 168 hours.  Recent Results (from the past 240 hour(s))  Culture, blood (routine x 2)     Status: None   Collection Time: 12/06/19  1:54 PM   Specimen: BLOOD  Result Value Ref Range Status   Specimen Description BLOOD LAC  Final   Special Requests   Final    BOTTLES DRAWN AEROBIC AND ANAEROBIC Blood Culture results may not be optimal due to an excessive volume of blood received in culture bottles   Culture   Final    NO GROWTH 5 DAYS Performed at [facility], [address], [place], [place] [address]    Report Status 12/11/2019 FINAL  Final  Culture, blood (  routine x 2)     Status: None   Collection Time: 12/06/19  1:54 PM   Specimen: BLOOD  Result Value Ref Range Status   Specimen Description BLOOD L BICEP  Final   Special Requests   Final    BOTTLES DRAWN AEROBIC AND ANAEROBIC Blood Culture results may not be optimal due to an excessive volume of blood received in culture bottles   Culture   Final    NO GROWTH 5 DAYS Performed at [facility], [address], [place], [place] [address]    Report Status 12/11/2019 FINAL  Final  SARS Coronavirus 2 by RT PCR (hospital order, performed in [place] hospital lab) Nasopharyngeal Nasopharyngeal Swab     Status: None   Collection Time: 12/06/19  1:54 PM   Specimen: Nasopharyngeal Swab  Result Value Ref Range Status   SARS Coronavirus 2 NEGATIVE NEGATIVE Final    Comment: (NOTE) SARS-CoV-2 target nucleic acids are NOT DETECTED.  The SARS-CoV-2 RNA is generally detectable in upper and lower respiratory specimens during the acute phase of infection.  The lowest concentration of SARS-CoV-2 viral copies this assay can detect is 250 copies / mL. A negative result does not preclude SARS-CoV-2 infection and should not be used as the sole basis for treatment or other patient management decisions.  A negative result may occur with improper specimen collection / handling, submission of specimen other than nasopharyngeal swab, presence of viral mutation(s) within the areas targeted by this assay, and inadequate number of viral copies (<250 copies / mL). A negative result must be combined with clinical observations, patient history, and epidemiological information.  Fact Sheet for Patients:   StrictlyIdeas.no  Fact Sheet for Healthcare Providers: BankingDealers.co.za  This test is not yet approved or  cleared by the [place] FDA and has been authorized for detection and/or diagnosis of SARS-CoV-2 by FDA under an Emergency Use Authorization (EUA).  This EUA will remain in effect (meaning this test can be used) for the duration of the COVID-19 declaration under Section 564(b)(1) of the Act, 21 U.S.C. section 360bbb-3(b)(1), unless the authorization is terminated or revoked sooner.  Performed at [facility], [address] [place], [place] [address]         Radiology Studies: PERIPHERAL VASCULAR CATHETERIZATION  Result Date: 12/10/2019 See op note       Scheduled Meds: . amLODipine  5 mg Oral Q0600  . aspirin EC  81 mg Oral Daily  . carvedilol  6.25 mg Oral BID WC  . Chlorhexidine Gluconate Cloth  6 each Topical Daily  . epoetin (EPOGEN/PROCRIT) injection  4,000 Units Intravenous Q T,Th,Sa-HD  . feeding supplement (NEPRO CARB STEADY)  237 mL Oral TID BM  . heparin  5,000 Units Subcutaneous Q8H  . hydrALAZINE  25 mg Oral Q8H  . insulin aspart  0-5 Units Subcutaneous QHS  . insulin aspart  0-6 Units Subcutaneous TID WC  . mouth rinse  15 mL Mouth Rinse BID  .  melatonin  2.5 mg Oral QHS  . multivitamin  1 tablet Oral QHS  . pantoprazole  40 mg Oral Daily  . sacubitril-valsartan  1 tablet Oral BID  . sertraline  100 mg Oral Daily  . sodium chloride flush  3 mL Intravenous Q12H   Continuous Infusions: . sodium chloride 10 mL/hr at 12/11/19 1012  . piperacillin-tazobactam (ZOSYN)  IV 2.25 g (12/11/19 1358)     LOS: 5 days   Landis Gandy MD [facility] 12/11/2019, 2:43  PM  If 7PM-7AM, please contact night-coverage [email or web address]

## 2019-12-11 NOTE — Interval H&P Note (Signed)
History and Physical Interval Note:  12/11/2019 10:00 AM  [name]  has presented today for surgery, with the diagnosis of Osteomyelitis.  The various methods of treatment have been discussed with the patient and family. After consideration of risks, benefits and other options for treatment, the patient has consented to  Procedure(s): AMPUTATION LEFT 2nd TOE (Left) as a surgical intervention.  The patient's history has been reviewed, patient examined, no change in status, stable for surgery.  I have reviewed the patient's chart and labs.  Questions were answered to the patient's satisfaction.     [name]

## 2019-12-11 NOTE — Transfer of Care (Signed)
Immediate Anesthesia Transfer of Care Note  [name]  Procedure(s) Performed: AMPUTATION LEFT 2nd TOE (Left Toe)  Patient Location: PACU  Anesthesia Type:General  Level of Consciousness: awake  Airway & Oxygen Therapy: Patient Spontanous Breathing  Post-op Assessment: Report given to RN and Post -op Vital signs reviewed and stable  Post vital signs: Reviewed and stable  Last Vitals:  Vitals Value Taken Time  BP 129/67 12/11/19 1104  Temp    Pulse 92 12/11/19 1106  Resp 23 12/11/19 1106  SpO2 100 % 12/11/19 1106  Vitals shown include unvalidated device data.  Last Pain:  Vitals:   12/11/19 0921  TempSrc: Temporal  PainSc: 0-No pain         Complications: No complications documented.

## 2019-12-12 ENCOUNTER — Encounter: Payer: Self-pay | Admitting: Podiatry

## 2019-12-12 LAB — GLUCOSE, CAPILLARY
Glucose-Capillary: 119 mg/dL — ABNORMAL HIGH (ref 70–99)
Glucose-Capillary: 90 mg/dL (ref 70–99)
Glucose-Capillary: 94 mg/dL (ref 70–99)
Glucose-Capillary: 421 mg/dL — ABNORMAL HIGH (ref 70–99)

## 2019-12-12 LAB — GLUCOSE, RANDOM: Glucose, Bld: 496 mg/dL — ABNORMAL HIGH (ref 70–99)

## 2019-12-12 MED ORDER — DIPHENHYDRAMINE HCL 25 MG PO CAPS
25.0000 mg | ORAL_CAPSULE | Freq: Four times a day (QID) | ORAL | Status: AC | PRN
  Administered 2019-12-12: 25 mg via ORAL
  Filled 2019-12-12: qty 1

## 2019-12-12 MED ORDER — INSULIN ASPART 100 UNIT/ML ~~LOC~~ SOLN
6.0000 [IU] | Freq: Once | SUBCUTANEOUS | Status: AC
  Administered 2019-12-12: 23:00:00 6 [IU] via SUBCUTANEOUS
  Filled 2019-12-12: qty 1

## 2019-12-12 NOTE — Progress Notes (Signed)
[name]  MRN: [id number]  DOB/AGE: [birth year] 60 y.o.  Primary Care Physician:Hande, Cherlyn Labella, MD  Admit date: 12/06/2019  Chief Complaint:  Chief Complaint  Patient presents with   Hypoglycemia    S-Pt presented on  12/06/2019 with  Chief Complaint  Patient presents with   Hypoglycemia  .    Pt today feels better.  Patient offers no specific complaints.  Patient tolerating dialysis well  Medication  amLODipine  5 mg Oral Q0600   aspirin EC  81 mg Oral Daily   carvedilol  6.25 mg Oral BID WC   Chlorhexidine Gluconate Cloth  6 each Topical Daily   epoetin (EPOGEN/PROCRIT) injection  4,000 Units Intravenous Q T,Th,Sa-HD   feeding supplement (NEPRO CARB STEADY)  237 mL Oral TID BM   heparin  5,000 Units Subcutaneous Q8H   hydrALAZINE  25 mg Oral Q8H   insulin aspart  0-5 Units Subcutaneous QHS   insulin aspart  0-6 Units Subcutaneous TID WC   insulin glargine  5 Units Subcutaneous QHS   mouth rinse  15 mL Mouth Rinse BID   melatonin  2.5 mg Oral QHS   multivitamin  1 tablet Oral QHS   pantoprazole  40 mg Oral Daily   sacubitril-valsartan  1 tablet Oral BID   sertraline  100 mg Oral Daily   sodium chloride flush  3 mL Intravenous Q12H         XAJ:OINOM from the symptoms mentioned above,there are no other symptoms referable to all systems reviewed.  Physical Exam: Vital signs in last 24 hours: Temp:  [97.7 F (36.5 C)-98.5 F (36.9 C)] 98.5 F (36.9 C) (07/31 1409) Pulse Rate:  [86-96] 96 (07/31 1409) Resp:  [16-24] 17 (07/31 1409) BP: (131-160)/(74-92) 159/83 (07/31 1409) SpO2:  [88 %-100 %] 95 % (07/31 1409) Weight change:  Last BM Date: 12/12/19 (per pt)  Intake/Output from previous day: 07/30 0701 - 07/31 0700 In: 720 [P.O.:720] Out: 5 [Blood:5] Total I/O In: 377.8 [I.V.:159.6; IV Piggyback:218.2] Out: 1001 [Other:1001]   Physical Exam: General- pt is awake,alert, oriented to time place and person Resp- No acute REsp  distress, CTA B/L NO Rhonchi CVS- S1S2 regular in rate and rhythm GIT- BS+, soft, NT, ND EXT- NO LE Edema, Cyanosis Foot in bandages Access patient has permacath in situ Patient has right AV graft  Lab Results: CBC Recent Labs    12/11/19 0933  HGB 8.8*  HCT 26.0*    BMET Recent Labs    12/10/19 0427 12/10/19 0427 12/10/19 0921 12/11/19 0933  NA 138  --   --  139  K 3.8  --   --  4.4  CL 102  --   --  101  CO2 26  --   --   --   GLUCOSE 67*   < > 74 359*  BUN 29*  --   --  16  CREATININE 4.03*  --   --  3.10*  CALCIUM 8.1*  --   --   --    < > = values in this interval not displayed.    MICRO Recent Results (from the past 240 hour(s))  Culture, blood (routine x 2)     Status: None   Collection Time: 12/06/19  1:54 PM   Specimen: BLOOD  Result Value Ref Range Status   Specimen Description BLOOD LAC  Final   Special Requests   Final    BOTTLES DRAWN AEROBIC AND ANAEROBIC Blood Culture results may not be  optimal due to an excessive volume of blood received in culture bottles   Culture   Final    NO GROWTH 5 DAYS Performed at [facility], [address] [place], [place] [address]    Report Status 12/11/2019 FINAL  Final  Culture, blood (routine x 2)     Status: None   Collection Time: 12/06/19  1:54 PM   Specimen: BLOOD  Result Value Ref Range Status   Specimen Description BLOOD L BICEP  Final   Special Requests   Final    BOTTLES DRAWN AEROBIC AND ANAEROBIC Blood Culture results may not be optimal due to an excessive volume of blood received in culture bottles   Culture   Final    NO GROWTH 5 DAYS Performed at [facility], [address] [place], [place] [address]    Report Status 12/11/2019 FINAL  Final  SARS Coronavirus 2 by RT PCR (hospital order, performed in [place] hospital lab) Nasopharyngeal Nasopharyngeal Swab     Status: None   Collection Time: 12/06/19  1:54 PM   Specimen: Nasopharyngeal Swab  Result Value Ref Range  Status   SARS Coronavirus 2 NEGATIVE NEGATIVE Final    Comment: (NOTE) SARS-CoV-2 target nucleic acids are NOT DETECTED.  The SARS-CoV-2 RNA is generally detectable in upper and lower respiratory specimens during the acute phase of infection. The lowest concentration of SARS-CoV-2 viral copies this assay can detect is 250 copies / mL. A negative result does not preclude SARS-CoV-2 infection and should not be used as the sole basis for treatment or other patient management decisions.  A negative result may occur with improper specimen collection / handling, submission of specimen other than nasopharyngeal swab, presence of viral mutation(s) within the areas targeted by this assay, and inadequate number of viral copies (<250 copies / mL). A negative result must be combined with clinical observations, patient history, and epidemiological information.  Fact Sheet for Patients:   StrictlyIdeas.no  Fact Sheet for Healthcare Providers: BankingDealers.co.za  This test is not yet approved or  cleared by the [place] FDA and has been authorized for detection and/or diagnosis of SARS-CoV-2 by FDA under an Emergency Use Authorization (EUA).  This EUA will remain in effect (meaning this test can be used) for the duration of the COVID-19 declaration under Section 564(b)(1) of the Act, 21 U.S.C. section 360bbb-3(b)(1), unless the authorization is terminated or revoked sooner.  Performed at [facility], [address], [place], [place] [address]   Aerobic/Anaerobic Culture (surgical/deep wound)     Status: None (Preliminary result)   Collection Time: 12/11/19 10:51 AM   Specimen: Wound  Result Value Ref Range Status   Specimen Description   Final    WOUND Performed at [facility], [address], [place], [place] [address]    Special Requests   Final    LEFT 2ND TOE BONE BX Performed at [facility], [address] [place], [place] [address]    Gram Stain   Final    FEW WBC PRESENT, PREDOMINANTLY PMN RARE GRAM POSITIVE RODS    Culture   Final    NO GROWTH < 24 HOURS Performed at [facility], [place] [address] [place], [place] [address]    Report Status PENDING  Incomplete      Lab Results  Component Value Date   CALCIUM 8.1 (L) 12/10/2019   CAION 1.15 12/11/2019   PHOS 3.4 08/25/2019  Impression:   Patient is a 59 year old African-American male with a past medical history of ESRD, diabetes mellitus, hypertension, CVA-with right-sided weakness, history of alcohol abuse, emphysema who was admitted on December 06, 2019 with chief complaint of acute respiratory failure with hypoxia, uncontrolled diabetes mellitus, alcohol abuse, hypertension, chronic osteomyelitis of left foot, acute pulmonary edema and hyperkalemia  1)Renal ESRD. Patient is on hemodialysis.   Patient was emergently dialyzed on Sunday night. Patient will be dialyzed today  2)HTN Blood pressure is at goal   3)Anemia of chronic disease  HGb is not at goal (9--11) Patient is on Epogen during dialysis  4) secondary hyperparathyroidism -CKD Mineral-Bone Disorder   Secondary Hyperparathyroidism present Patient has history of hyperparathyroidism with PTH being higher as an outpatient  Phosphorus at goal.   5) peripheral vascular disease Patient MRI done showed osteomyelitis Patient had angiogram done on July 29 Vascular surgery and podiatry team are following closely with the hospitalist   6) electrolytes   sodium Normonatremic   potassium Normokalemic    7)Acid base Co2 at goal     Plan:  We will continue the current treatment plan Patient patient being dialyzed today    Jakyra Kenealy s [facility] 12/12/2019, 4:23 PM

## 2019-12-12 NOTE — Progress Notes (Signed)
Inpatient Diabetes Program Recommendations  AACE/ADA: New Consensus Statement on Inpatient Glycemic Control (2015)  Target Ranges:  Prepandial:   less than 140 mg/dL      Peak postprandial:   less than 180 mg/dL (1-2 hours)      Critically ill patients:  140 - 180 mg/dL   Lab Results  Component Value Date   GLUCAP 119 (H) 12/12/2019   HGBA1C 7.4 (H) 12/10/2019    Review of Glycemic Control Results for DURAND, WITTMEYER (MRN [id number]) as of 12/12/2019 08:33  Ref. Range 12/11/2019 12:08 12/11/2019 16:23 12/11/2019 22:16 12/12/2019 08:32  Glucose-Capillary Latest Ref Range: 70 - 99 mg/dL 309 (H) 470 (H) 389 (H) 119 (H)   Diabetes history: Type 2 Dm Home DM Meds: Lantus 20 units Daily                             Humalog 0-15 units TID  Current Orders: Novolog 0-6 units TID ac + hs   Inpatient Diabetes Program Recommendations:   Noted patient with ESRD and high risk for hypoglycemia. Would consider Novolog 2 units TID (assuming patient is consuming >50% of meal).   Thanks, Bronson Curb, MSN, RNC-OB Diabetes Coordinator [phone] (8a-5p)

## 2019-12-12 NOTE — Progress Notes (Signed)
Pt back from HD via orderly in hospital bed

## 2019-12-12 NOTE — Progress Notes (Signed)
Attempted to call [name] at [phone] back (she had called earlier this morning) received voicemail message; no message left at this time

## 2019-12-12 NOTE — Progress Notes (Signed)
PROGRESS NOTE    [name]  WGN:[id number] DOB: 1961-05-08 DOA: 12/06/2019 PCP: [name], MD   Brief Narrative: [name] is a 59 y.o. male with a history of uncontrolled DM, poorly controlled HTN, ESRD on HD, previous alcohol use no use x1 year. Patient presented secondary to altered mental status and found to be hypoglycemic and severely fluid overloaded requiring emergent HD.   Assessment & Plan:   Principal Problem:   Acute respiratory failure with hypoxia (HCC) Active Problems:   Uncontrolled diabetes mellitus (HCC)   Alcohol abuse   HTN (hypertension)   CVA (cerebral vascular accident) ([place])   Chronic osteomyelitis of toe of left foot (HCC)   Diabetic foot infection ([place])   Acute pulmonary edema (HCC)   Acute respiratory failure with hypoxia- resolved .Secondary to acute pulmonary edema from missed HD. Patient received emergent HD on the night of admission with improvement of symptoms and hypoxia. Patient also received lasix IV -Wean oxygen to room air as able -HD per nephrology  Acute pulmonary edema Secondary to fluid overload. Improved with HD. -Nephrology recommendations for HD  ESRD on HD -HD per nephrology  Diabetic foot infection-status post amputation of the left second toe 12/11/2019.  Culture pending on Zosyn.  ID following. PT consult  Essential hypertension blood pressure 137/80.  Continue amlodipine Coreg and restart hydralazine.  Hypoglycemia-resolved with type 2 diabetes Patient with a blood sugar of 21 on admission. CBG (last 3)  Recent Labs    12/11/19 1623 12/11/19 2216 12/12/19 0832  GLUCAP 470* 389* 119*   Continue Lantus 5 units nightly with NovoLog prior to meals.  Lantus has been restarted yesterday.  Acute metabolic encephalopathy Secondary to hypoglycemia. Resolved.  Depression -Continue sertraline  Malnutrition Unknown severity -Continue nutrition supplements  [name] Pancreatic  pseudocyst -Outpatient follow-up  DVT prophylaxis: heparin subq Code Status:   Code Status: Full Code Family Communication: None at bedside. Wife on telephone Disposition Plan: Unknown patient now has osteomyelitis of the left second toe waiting on ID and vascular surgery evaluation.  Status post amputation of the left second toe culture pending. Consultants:   Nephrology, vascular surgery and infectious disease  Procedures:   Hemodialysis  Antimicrobials:  None    Subjective: He is resting in bed in no acute distress denies any chest pain or shortness of breath or nausea vomiting.   Asking for breakfast.   Objective: Vitals:   12/12/19 1030 12/12/19 1045 12/12/19 1115 12/12/19 1130  BP:      Pulse: 89 91 96 94  Resp:      Temp:      TempSrc:      SpO2:      Weight:      Height:        Intake/Output Summary (Last 24 hours) at 12/12/2019 1300 Last data filed at 12/12/2019 0849 Gross per 24 hour  Intake 1097.79 ml  Output --  Net 1097.79 ml   Filed Weights   12/06/19 1241 12/07/19 1133  Weight: 72.6 kg 57.3 kg    Examination:  General exam: Appears calm and comfortable Respiratory system: Scattered rhonchi in both lung fields respiratory effort normal. Cardiovascular system: S1 & S2 heard, RRR. No murmurs, rubs, gallops or clicks. Right arm AV graft with palpable thrill and audible bruit Gastrointestinal system: Abdomen is nondistended, soft and nontender. No organomegaly or masses felt. Normal bowel sounds heard. Central nervous system: Alert and oriented. No focal neurological deficits. Musculoskeletal: Toe covered with dressing  pictures noted with the left second toe gangrene Skin: No cyanosis. No rashes Psychiatry: Judgement and insight appear normal. Mood & affect appropriate.     Data Reviewed: I have personally reviewed following labs and imaging studies  CBC Lab Results  Component Value Date   WBC 5.3 12/09/2019   RBC 2.65 (L) 12/09/2019   HGB  8.8 (L) 12/11/2019   HCT 26.0 (L) 12/11/2019   MCV 95.1 12/09/2019   MCH 31.7 12/09/2019   PLT 154 12/09/2019   MCHC 33.3 12/09/2019   RDW 15.7 (H) 12/09/2019   LYMPHSABS 1.2 11/25/2019   MONOABS 0.3 11/25/2019   EOSABS 0.1 11/25/2019   BASOSABS 0.0 62/56/3893     Last metabolic panel Lab Results  Component Value Date   NA 139 12/11/2019   K 4.4 12/11/2019   CL 101 12/11/2019   CO2 26 12/10/2019   BUN 16 12/11/2019   CREATININE 3.10 (H) 12/11/2019   GLUCOSE 359 (H) 12/11/2019   GFRNONAA 15 (L) 12/10/2019   GFRAA 18 (L) 12/10/2019   CALCIUM 8.1 (L) 12/10/2019   PHOS 3.4 08/25/2019   PROT 7.4 12/06/2019   ALBUMIN 3.4 (L) 12/06/2019   LABGLOB 2.7 07/26/2018   AGRATIO 0.9 07/26/2018   BILITOT 0.7 12/06/2019   ALKPHOS 294 (H) 12/06/2019   AST 43 (H) 12/06/2019   ALT 38 12/06/2019   ANIONGAP 10 12/10/2019    CBG (last 3)  Recent Labs    12/11/19 1623 12/11/19 2216 12/12/19 0832  GLUCAP 470* 389* 119*     GFR: Estimated Creatinine Clearance: 20.5 mL/min (A) (by C-G formula based on SCr of 3.1 mg/dL (H)).  Coagulation Profile: No results for input(s): INR, PROTIME in the last 168 hours.  Recent Results (from the past 240 hour(s))  Culture, blood (routine x 2)     Status: None   Collection Time: 12/06/19  1:54 PM   Specimen: BLOOD  Result Value Ref Range Status   Specimen Description BLOOD LAC  Final   Special Requests   Final    BOTTLES DRAWN AEROBIC AND ANAEROBIC Blood Culture results may not be optimal due to an excessive volume of blood received in culture bottles   Culture   Final    NO GROWTH 5 DAYS Performed at [facility], [address] [place], [place] [address]    Report Status 12/11/2019 FINAL  Final  Culture, blood (routine x 2)     Status: None   Collection Time: 12/06/19  1:54 PM   Specimen: BLOOD  Result Value Ref Range Status   Specimen Description BLOOD L BICEP  Final   Special Requests   Final    BOTTLES DRAWN AEROBIC AND  ANAEROBIC Blood Culture results may not be optimal due to an excessive volume of blood received in culture bottles   Culture   Final    NO GROWTH 5 DAYS Performed at [facility], [address] [place], [place] [address]    Report Status 12/11/2019 FINAL  Final  SARS Coronavirus 2 by RT PCR (hospital order, performed in [place] hospital lab) Nasopharyngeal Nasopharyngeal Swab     Status: None   Collection Time: 12/06/19  1:54 PM   Specimen: Nasopharyngeal Swab  Result Value Ref Range Status   SARS Coronavirus 2 NEGATIVE NEGATIVE Final    Comment: (NOTE) SARS-CoV-2 target nucleic acids are NOT DETECTED.  The SARS-CoV-2 RNA is generally detectable in upper and lower respiratory specimens during the acute phase of infection. The lowest concentration  of SARS-CoV-2 viral copies this assay can detect is 250 copies / mL. A negative result does not preclude SARS-CoV-2 infection and should not be used as the sole basis for treatment or other patient management decisions.  A negative result may occur with improper specimen collection / handling, submission of specimen other than nasopharyngeal swab, presence of viral mutation(s) within the areas targeted by this assay, and inadequate number of viral copies (<250 copies / mL). A negative result must be combined with clinical observations, patient history, and epidemiological information.  Fact Sheet for Patients:   StrictlyIdeas.no  Fact Sheet for Healthcare Providers: BankingDealers.co.za  This test is not yet approved or  cleared by the [place] FDA and has been authorized for detection and/or diagnosis of SARS-CoV-2 by FDA under an Emergency Use Authorization (EUA).  This EUA will remain in effect (meaning this test can be used) for the duration of the COVID-19 declaration under Section 564(b)(1) of the Act, 21 U.S.C. section 360bbb-3(b)(1), unless the authorization is  terminated or revoked sooner.  Performed at [facility], [address], [place], [place] [address]   Aerobic/Anaerobic Culture (surgical/deep wound)     Status: None (Preliminary result)   Collection Time: 12/11/19 10:51 AM   Specimen: Wound  Result Value Ref Range Status   Specimen Description   Final    WOUND Performed at [facility], [address] [place], [place] [address]    Special Requests   Final    LEFT 2ND TOE BONE BX Performed at [facility], [address] [place], [place] [address]    Gram Stain   Final    FEW WBC PRESENT, PREDOMINANTLY PMN RARE GRAM POSITIVE RODS    Culture   Final    NO GROWTH < 24 HOURS Performed at [facility], [place] [address] [place], [place] [address]    Report Status PENDING  Incomplete        Radiology Studies: No results found.      Scheduled Meds: . amLODipine  5 mg Oral Q0600  . aspirin EC  81 mg Oral Daily  . carvedilol  6.25 mg Oral BID WC  . Chlorhexidine Gluconate Cloth  6 each Topical Daily  . epoetin (EPOGEN/PROCRIT) injection  4,000 Units Intravenous Q T,Th,Sa-HD  . feeding supplement (NEPRO CARB STEADY)  237 mL Oral TID BM  . heparin  5,000 Units Subcutaneous Q8H  . hydrALAZINE  25 mg Oral Q8H  . insulin aspart  0-5 Units Subcutaneous QHS  . insulin aspart  0-6 Units Subcutaneous TID WC  . insulin glargine  5 Units Subcutaneous QHS  . mouth rinse  15 mL Mouth Rinse BID  . melatonin  2.5 mg Oral QHS  . multivitamin  1 tablet Oral QHS  . pantoprazole  40 mg Oral Daily  . sacubitril-valsartan  1 tablet Oral BID  . sertraline  100 mg Oral Daily  . sodium chloride flush  3 mL Intravenous Q12H   Continuous Infusions: . sodium chloride 10 mL/hr at 12/12/19 0849  . piperacillin-tazobactam (ZOSYN)  IV Stopped (12/12/19 9326)     LOS: 6 days   Landis Gandy MD [facility] 12/12/2019, 1:00 PM  If 7PM-7AM, please contact night-coverage [email or web address]

## 2019-12-12 NOTE — Progress Notes (Signed)
1 Day Post-Op   Subjective/Chief Complaint: Patient seen.  Still relates some significant pain with his left foot.   Objective: Vital signs in last 24 hours: Temp:  [97.7 F (36.5 C)-98.5 F (36.9 C)] 98.5 F (36.9 C) (07/31 1409) Pulse Rate:  [86-96] 96 (07/31 1409) Resp:  [16-19] 17 (07/31 1409) BP: (131-160)/(79-92) 159/83 (07/31 1409) SpO2:  [88 %-100 %] 95 % (07/31 1409) Last BM Date: 12/12/19 (per pt)  Intake/Output from previous day: 07/30 0701 - 07/31 0700 In: 720 [P.O.:720] Out: 5 [Blood:5] Intake/Output this shift: Total I/O In: 377.8 [I.V.:159.6; IV Piggyback:218.2] Out: -   The bandage on the left foot is dry and intact.  Upon removal some mild dried blood dorsally.  The incision is well coapted with viable skin edges.  No cellulitis or sign of infection.    Lab Results:  Recent Labs    12/11/19 0933  HGB 8.8*  HCT 26.0*   BMET Recent Labs    12/10/19 0427 12/10/19 0427 12/10/19 0921 12/11/19 0933  NA 138  --   --  139  K 3.8  --   --  4.4  CL 102  --   --  101  CO2 26  --   --   --   GLUCOSE 67*   < > 74 359*  BUN 29*  --   --  16  CREATININE 4.03*  --   --  3.10*  CALCIUM 8.1*  --   --   --    < > = values in this interval not displayed.   PT/INR No results for input(s): LABPROT, INR in the last 72 hours. ABG No results for input(s): PHART, HCO3 in the last 72 hours.  Invalid input(s): PCO2, PO2  Studies/Results: No results found.  Anti-infectives: Anti-infectives (From admission, onward)   Start     Dose/Rate Route Frequency Ordered Stop   12/10/19 1400  piperacillin-tazobactam (ZOSYN) IVPB 2.25 g     Discontinue     2.25 g 100 mL/hr over 30 Minutes Intravenous Every 8 hours 12/10/19 1056     12/10/19 1045  ceFAZolin (ANCEF) IVPB 1 g/50 mL premix       Note to Pharmacy: To be given in specials   1 g 100 mL/hr over 30 Minutes Intravenous  Once 12/10/19 1032 12/10/19 1247   12/09/19 2200  piperacillin-tazobactam (ZOSYN) IVPB  3.375 g  Status:  Discontinued        3.375 g 12.5 mL/hr over 240 Minutes Intravenous Every 8 hours 12/09/19 1944 12/10/19 1056   12/06/19 1530  cefTRIAXone (ROCEPHIN) 2 g in sodium chloride 0.9 % 100 mL IVPB        2 g 200 mL/hr over 30 Minutes Intravenous  Once 12/06/19 1515 12/06/19 1633   12/06/19 1530  azithromycin (ZITHROMAX) 500 mg in sodium chloride 0.9 % 250 mL IVPB        500 mg 250 mL/hr over 60 Minutes Intravenous  Once 12/06/19 1515 12/06/19 1806      Assessment/Plan: s/p Procedure(s): AMPUTATION LEFT 2nd TOE (Left) Assessment: Stable status post amputation left second toe.   Plan: Betadine and a sterile bandage reapplied to the left foot.  Discussed with the patient that he will keep the bandage clean, dry, and do not remove.  Discussed weightbearing on the left foot with pressure only on the heel in the surgical shoe.  From podiatry standpoint patient is stable for discharge whenever medically appropriate and I think he should be fine  on oral antibiotics as the amputation should have been therapeutic with the infection distally in the toe.  I will plan for follow-up midweek this coming week outpatient.  If still in the hospital on Monday most likely will change the bandage prior to discharge.  LOS: 6 days    [name] 12/12/2019

## 2019-12-13 LAB — GLUCOSE, CAPILLARY: Glucose-Capillary: 164 mg/dL — ABNORMAL HIGH (ref 70–99)

## 2019-12-13 MED ORDER — AMOXICILLIN-POT CLAVULANATE 500-125 MG PO TABS
1.0000 | ORAL_TABLET | Freq: Two times a day (BID) | ORAL | Status: DC
  Filled 2019-12-13 (×2): qty 1

## 2019-12-13 MED ORDER — HYDROCODONE-ACETAMINOPHEN 5-325 MG PO TABS: 1.0000 | ORAL_TABLET | ORAL | 0 refills | Status: DC | PRN

## 2019-12-13 MED ORDER — AMOXICILLIN-POT CLAVULANATE 500-125 MG PO TABS: 1.0000 | ORAL_TABLET | Freq: Two times a day (BID) | ORAL | 0 refills | Status: DC

## 2019-12-13 NOTE — Discharge Summary (Signed)
Physician Discharge Summary  [name] CZY:[id number] DOB: 05/07/1961 DOA: 12/06/2019  PCP: [name], MD  Admit date: 12/06/2019 Discharge date: 12/13/2019  Admitted From: Home Disposition: Home Recommendations for Outpatient Follow-up:  1. Follow up with PCP in 1-2 weeks/patient has a pancreatic pseudocyst which needs follow-up as an outpatient. 2. Please obtain BMP/CBC in one week 3. Please follow up for dialysis, podiatry, vascular surgeon  Home Health: None Equipment/Devices: None Discharge Condition: Stable and improved CODE STATUS full code Diet recommendation: Cardiac diet Brief/Interim Summary:[name] is a 59 y.o. male with a history of uncontrolledDM, poorly controlledHTN, ESRD onHD,previous alcohol use no use x1 year. Patient presented secondary to altered mental status and found to be hypoglycemic and severely fluid overloaded requiring emergent HD.    Discharge Diagnoses:  Principal Problem:   Acute respiratory failure with hypoxia (HCC) Active Problems:   Uncontrolled diabetes mellitus ([place])   Alcohol abuse   HTN (hypertension)   CVA (cerebral vascular accident) ([place])   Chronic osteomyelitis of toe of left foot (HCC)   Diabetic foot infection ([place])   Acute pulmonary edema (HCC)  Acute respiratory failure with hypoxia- resolved .Secondary to acute pulmonary edema from missed HD. Patient received emergent HD on the night of admission with improvement of symptoms and hypoxia. Patient also received lasix IV  Acute pulmonary edema Secondary to fluid overload. Improved with HD.  ESRD on HD -HD per nephrology  Diabetic foot infection-status post amputation of the left second toe 12/11/2019.  Culture no growth on dc. D/W dr Cleda Mccreedy source control was obtained with amputation.he will be discharged on augmentin for 5 days.  Essential hypertension blood pressure 137/80.  Continue  Coreg entresto and hydralazine.  Hypoglycemia-resolved with type 2  diabetes Patient with a blood sugar of 21 on admission. CBG (last 3)  Recent Labs (last 2 labs)        Recent Labs    12/11/19 1623 12/11/19 2216 12/12/19 0832  GLUCAP 470* 389* 119*     Continue Lantus   Acute metabolic encephalopathy Secondary to hypoglycemia. Resolved.  Depression -Continue sertraline  Malnutrition Unknown severity -Continue nutrition supplements   Nutrition Problem: Severe Malnutrition Etiology: chronic illness (ESRD on HD)    Signs/Symptoms: severe fat depletion, severe muscle depletion     Interventions: Refer to RD note for recommendations  Estimated body mass index is 17.63 kg/m as calculated from the following:   Height as of this encounter: 5\' 11"  (1.803 m).   Weight as of this encounter: 57.3 kg.  Discharge Instructions  Discharge Instructions    Diet - low sodium heart healthy   Complete by: As directed    Discharge wound care:   Complete by: As directed    Wash foot with soap and water rinse and dry thoroughly paying attention to dry well between the toes.  Apply Betadine to scabbed areas on discolored second digit and allowed to air dry.  Please put in the Prevalon boot for protection.   Increase activity slowly   Complete by: As directed      Allergies as of 12/13/2019      Reactions   Ferrous Gluconate Nausea And Vomiting   Other       Medication List    STOP taking these medications   amLODipine 2.5 MG tablet Commonly known as: NORVASC   amLODipine 5 MG tablet Commonly known as: NORVASC   hydrALAZINE 25 MG tablet Commonly known as: APRESOLINE   lisinopril 30 MG tablet Commonly known  as: ZESTRIL   metoprolol tartrate 50 MG tablet Commonly known as: LOPRESSOR     TAKE these medications   amoxicillin-clavulanate 500-125 MG tablet Commonly known as: AUGMENTIN Take 1 tablet (500 mg total) by mouth 2 (two) times daily.   aspirin 81 MG EC tablet Take 1 tablet (81 mg total) by mouth daily.   carvedilol  6.25 MG tablet Commonly known as: COREG Take 6.25 mg by mouth 2 (two) times daily with a meal.   Entresto 24-26 MG Generic drug: sacubitril-valsartan Take 1 tablet by mouth 2 (two) times daily.   feeding supplement (NEPRO CARB STEADY) Liqd Take 237 mLs by mouth 3 (three) times daily between meals.   folic acid 1 MG tablet Commonly known as: FOLVITE Take 1 mg by mouth daily.   HYDROcodone-acetaminophen 5-325 MG tablet Commonly known as: NORCO/VICODIN Take 1 tablet by mouth every 4 (four) hours as needed for moderate pain. What changed:   how much to take  when to take this  reasons to take this   insulin glargine 100 UNIT/ML injection Commonly known as: LANTUS Inject 0.1 mLs (10 Units total) into the skin daily. What changed: how much to take   insulin lispro 100 UNIT/ML KwikPen Commonly known as: HumaLOG KwikPen Inject 0-0.09 mLs (0-9 Units total) into the skin 3 (three) times daily with meals. According to sliding scale What changed: how much to take   melatonin 3 MG Tabs tablet Take 1 tablet by mouth at bedtime.   multivitamin Tabs tablet Take 1 tablet by mouth at bedtime.   pantoprazole 40 MG tablet Commonly known as: PROTONIX Take 40 mg by mouth daily.   Prosol 20 % Soln Inject into the vein.   sertraline 100 MG tablet Commonly known as: ZOLOFT Take 100 mg by mouth daily.   thiamine 100 MG tablet Commonly known as: VITAMIN B-1 Take 100 mg by mouth daily.            Discharge Care Instructions  (From admission, onward)         Start     Ordered   12/13/19 0000  Discharge wound care:       Comments: Wash foot with soap and water rinse and dry thoroughly paying attention to dry well between the toes.  Apply Betadine to scabbed areas on discolored second digit and allowed to air dry.  Please put in the Prevalon boot for protection.   12/13/19 0913          Follow-up Information    Dew, Erskine Squibb, MD Follow up in 1 month(s).   Specialties:  Vascular Surgery, Radiology, Interventional Cardiology Why: Can see Dew or Arna Medici. Seen as consult. Will need ABI with visit.  Contact information: [place] [address] [phone]        [name], MD Follow up.   Specialty: Internal Medicine Contact information: [address] [place] [address] [phone]        Sharlotte Alamo, DPM Follow up.   Specialty: Podiatry Contact information: [place] [address] [phone]              Allergies  Allergen Reactions  . Ferrous Gluconate Nausea And Vomiting  . Other     Consultations: Podiatry nephrology  Procedures/Studies: CT Angio Chest PE W and/or Wo Contrast  Result Date: 12/06/2019 CLINICAL DATA:  High clinical suspicion of pulmonary embolism. End-stage renal disease on hemodialysis for 2 years. EXAM: CT ANGIOGRAPHY CHEST WITH  CONTRAST TECHNIQUE: Multidetector CT imaging of the chest was performed using the standard protocol during bolus administration of intravenous contrast. Multiplanar CT image reconstructions and MIPs were obtained to evaluate the vascular anatomy. CONTRAST:  59mL OMNIPAQUE IOHEXOL 350 MG/ML SOLN COMPARISON:  Noncontrast chest CT 05/27/2019. Abdominal CT 07/17/2019. FINDINGS: Cardiovascular: The pulmonary arteries are well opacified with contrast to the level of the subsegmental branches. There is no evidence of acute pulmonary embolism. Atherosclerosis of the aorta, great vessels and coronary arteries. There is limited opacification of the systemic vessels, but no acute vascular findings are seen. The heart is mildly enlarged. There is a small pericardial effusion. Right IJ hemodialysis catheter extends to the superior cavoatrial junction. Mediastinum/Nodes: Mildly progressive mediastinal and hilar adenopathy, largest a right paratracheal node measuring 16 mm on image 21/4. No axillary adenopathy. The thyroid gland, trachea  and esophagus demonstrate no significant findings. Lungs/Pleura: Moderate-sized dependent pleural effusions bilaterally with associated bibasilar atelectasis. In addition, there are diffusely increased septal markings throughout both lungs, suspicious for edema superimposed on emphysema. No confluent airspace opacity or suspicious pulmonary nodularity. Upper abdomen: Enlarging, incompletely visualized fluid collection in the left upper quadrant of the abdomen, measuring up to 8.0 x 5.2 cm on image 94/4. This lies between the stomach and spleen and has enlarged compared with previous abdominal CT where there was a much smaller collection adjacent to a calcified pancreatic tail. Appearance suggests a pancreatic pseudocyst. Generalized soft tissue edema throughout the upper abdomen without other focal fluid collection. Musculoskeletal/Chest wall: There is no chest wall mass or suspicious osseous finding. Generalized soft tissue edema consistent with anasarca/volume overload. Review of the MIP images confirms the above findings. IMPRESSION: 1. No evidence of acute pulmonary embolism. 2. Cardiomegaly, bilateral pleural effusions and diffuse septal thickening in both lungs most consistent with congestive heart failure/volume overload. Associated generalized soft tissue edema. 3. Mildly progressive mediastinal and hilar adenopathy, likely reactive. 4. Enlarging, incompletely visualized fluid collection in the left upper quadrant of the abdomen, suspicious for a pancreatic pseudocyst. 5. Aortic Atherosclerosis (ICD10-I70.0) and Emphysema (ICD10-J43.9). Electronically Signed   By: Richardean Sale M.D.   On: 12/06/2019 15:37   MR FOOT LEFT WO CONTRAST  Result Date: 12/08/2019 CLINICAL DATA:  Question of osteomyelitis of the second toe diabetic foot infection EXAM: MRI OF THE LEFT FOOT WITHOUT CONTRAST TECHNIQUE: Multiplanar, multisequence MR imaging of the left was performed. No intravenous contrast was administered.  COMPARISON:  December 07, 2019 radiograph FINDINGS: Bones/Joint/Cartilage There is cortical irregularity with erosive type changes seen through the distal second phalanx. There is T2 hyperintense signal with T1 hypointensity seen throughout the distal phalanx. There is increased T2 hyperintense signal seen within the middle phalanx of the second digit without T1 hypointensity or cortical irregularity. Erosive type change or prior osteotomies are seen at the fourth and third MTP joints. There is also mildly increased signal seen within the metatarsals and midfoot without associated T1 hypointensity. Ligaments The Lisfranc ligaments appear to be intact. Muscles and Tendons Fatty atrophy with increased signal seen throughout the muscles of the forefoot. The flexor and extensor tendons appear to be grossly intact. Soft tissues Area of ulceration seen overlying distal second phalanx with diffuse soft tissue edema and skin thickening. No loculated fluid collection or sinus tract is seen. There is mild dorsal soft tissue swelling seen over the forefoot. IMPRESSION: 1. Area of ulceration over the distal second digit with findings of acute osteomyelitis involving the distal phalanx. No loculated fluid collections or sinus tract.  2. Reactive marrow edema within the middle phalanx of the second digit and midfoot. Electronically Signed   By: Prudencio Pair M.D.   On: 12/08/2019 21:54   PERIPHERAL VASCULAR CATHETERIZATION  Result Date: 12/10/2019 See op note  US ARTERIAL ABI (SCREENING LOWER EXTREMITY)  Result Date: 12/08/2019 CLINICAL DATA:  Left second toe infection. Diabetes, hypertension, hyperlipidemia, renal failure, history of tobacco abuse EXAM: NONINVASIVE PHYSIOLOGIC VASCULAR STUDY OF BILATERAL LOWER EXTREMITIES TECHNIQUE: Evaluation of both lower extremities were performed at rest, including calculation of ankle-brachial indices with single level Doppler, pressure and pulse volume recording. COMPARISON:  None  available FINDINGS: Right ABI: Non calculable due to vascular noncompressibility. Toe brachial index 0.5 Left ABI: Non calculable due to vascular noncompressibility. Toe brachial index 0.45 Right Lower Extremity:  Biphasic distal arterial waveforms Left Lower Extremity:  Biphasic distal arterial waveforms IMPRESSION: ABIs are non diagnostic secondary to incompressible vessel calcifications (medial arterial sclerosis of [place]ckeberg). Decreased toe brachial indices consistent with at least moderate bilateral arterial occlusive disease at rest. Electronically Signed   By: Lucrezia Europe M.D.   On: 12/08/2019 13:13   DG Chest Portable 1 View  Result Date: 12/06/2019 CLINICAL DATA:  12-year-old male with history of cough and hypoxia. EXAM: PORTABLE CHEST 1 VIEW COMPARISON:  Chest x-ray 09/16/2019. FINDINGS: Right-sided PermCath with tip terminating at the superior cavoatrial junction. There is cephalization of the pulmonary vasculature and slight indistinctness of the interstitial markings suggestive of mild pulmonary edema. Small left pleural effusion. No right pleural effusion. No pneumothorax. Heart size is mildly enlarged. Upper mediastinal contours are within normal limits. Aortic atherosclerosis. IMPRESSION: 1. The appearance of the chest suggests mild congestive heart failure, as above. 2. Aortic atherosclerosis. Electronically Signed   By: Vinnie Langton M.D.   On: 12/06/2019 13:22   DG Foot Complete Left  Result Date: 12/07/2019 CLINICAL DATA:  59 year old male with diabetic foot infection. EXAM: LEFT FOOT - COMPLETE 3+ VIEW COMPARISON:  Left ankle radiograph dated 11/09/2019. FINDINGS: There is no acute fracture or dislocation. The bones are osteopenic. Erosive changes of the middle and distal phalanx of the second toe, progressed since the prior radiograph and concerning for osteomyelitis. Clinical correlation is recommended. There are erosive changes versus osteotomy of the third and fourth MTP joints  similar to prior radiograph. There is soft tissue swelling and small pockets of soft tissue air in the distal second toe in keeping with ulcer. Vascular calcifications noted. IMPRESSION: 1. No acute fracture or dislocation. 2. Ulceration of the distal aspect of the second toe with findings concerning for osteomyelitis of the middle and distal phalanges of the second toe. MRI or a WBC nuclear scan may provide better evaluation. Electronically Signed   By: Anner Crete M.D.   On: 12/07/2019 19:05   DG Foot Complete Right  Result Date: 12/07/2019 CLINICAL DATA:  Bilateral diabetic foot infection. Bilateral foot abscess. EXAM: RIGHT FOOT COMPLETE - 3+ VIEW COMPARISON:  Remote right foot exam 03/01/2018 FINDINGS: No bony destruction, periosteal reaction, or abnormal bone density to suggest osteomyelitis. No visualized large soft tissue defects or soft tissue air. Osteoarthritis of the first metatarsal phalangeal joint again seen, slight progression from 2019. slight hammertoe deformity of the fourth digit. Alignment otherwise maintained. There are vascular calcifications. IMPRESSION: 1. No radiographic evidence of osteomyelitis. No visualized large soft tissue defects/ulcer or soft tissue air. 2. Osteoarthritis of the first metatarsophalangeal joint, slightly progressed from 2019. 3. Vascular calcifications. Electronically Signed   By: Aurther Loft.D.  On: 12/07/2019 19:01   ECHOCARDIOGRAM COMPLETE  Result Date: 12/07/2019    ECHOCARDIOGRAM REPORT   Patient Name:   [name] Date of Exam: 12/07/2019 Medical Rec #:  [id number]     Height:       71.0 in Accession #:    [id number]    Weight:       126.4 lb Date of Birth:  02/07/1961     BSA:          1.735 m Patient Age:    27 years      BP:           136/75 mmHg Patient Gender: M             HR:           85 bpm. Exam Location:  [facility] Procedure: 2D Echo, Color Doppler and Cardiac Doppler Indications:     CHF 428.0  History:         Patient has no prior  history of Echocardiogram examinations.                  Stroke; Risk Factors:Hypertension, Diabetes and Dyslipidemia.                  CKD, ETOH abuse.  Sonographer:     Sherrie Sport RDCS (AE) Referring Phys:  [id number] [place] Diagnosing Phys: Isaias Cowman MD IMPRESSIONS  1. Left ventricular ejection fraction, by estimation, is 45 to 50%. The left ventricle has mildly decreased function. The left ventricle has no regional wall motion abnormalities. Left ventricular diastolic parameters were normal.  2. Right ventricular systolic function is normal. The right ventricular size is normal. There is severely elevated pulmonary artery systolic pressure.  3. Left atrial size was moderately dilated.  4. The mitral valve is normal in structure. Moderate to severe mitral valve regurgitation. No evidence of mitral stenosis.  5. Tricuspid valve regurgitation is moderate.  6. The aortic valve is normal in structure. Aortic valve regurgitation is mild. No aortic stenosis is present.  7. The inferior vena cava is normal in size with greater than 50% respiratory variability, suggesting right atrial pressure of 3 mmHg. FINDINGS  Left Ventricle: Left ventricular ejection fraction, by estimation, is 45 to 50%. The left ventricle has mildly decreased function. The left ventricle has no regional wall motion abnormalities. The left ventricular internal cavity size was normal in size. There is no left ventricular hypertrophy. Left ventricular diastolic parameters were normal. Right Ventricle: The right ventricular size is normal. No increase in right ventricular wall thickness. Right ventricular systolic function is normal. There is severely elevated pulmonary artery systolic pressure. The tricuspid regurgitant velocity is 3.56 m/s, and with an assumed right atrial pressure of 10 mmHg, the estimated right ventricular systolic pressure is 41.6 mmHg. Left Atrium: Left atrial size was moderately dilated. Right Atrium:  Right atrial size was normal in size. Pericardium: There is no evidence of pericardial effusion. Mitral Valve: The mitral valve is normal in structure. Normal mobility of the mitral valve leaflets. Moderate to severe mitral valve regurgitation. No evidence of mitral valve stenosis. Tricuspid Valve: The tricuspid valve is normal in structure. Tricuspid valve regurgitation is moderate . No evidence of tricuspid stenosis. Aortic Valve: The aortic valve is normal in structure. Aortic valve regurgitation is mild. No aortic stenosis is present. Aortic valve mean gradient measures 4.0 mmHg. Aortic valve peak gradient measures 6.8 mmHg. Aortic valve area, by VTI measures 2.49 cm. Pulmonic Valve:  The pulmonic valve was normal in structure. Pulmonic valve regurgitation is not visualized. No evidence of pulmonic stenosis. Aorta: The aortic root is normal in size and structure. Venous: The inferior vena cava is normal in size with greater than 50% respiratory variability, suggesting right atrial pressure of 3 mmHg. IAS/Shunts: No atrial level shunt detected by color flow Doppler.  LEFT VENTRICLE PLAX 2D LVIDd:         5.30 cm      Diastology LVIDs:         4.03 cm      LV e' lateral:   10.10 cm/s LV PW:         1.13 cm      LV E/e' lateral: 10.1 LV IVS:        1.07 cm      LV e' medial:    6.74 cm/s LVOT diam:     2.10 cm      LV E/e' medial:  15.1 LV SV:         58 LV SV Index:   33 LVOT Area:     3.46 cm  LV Volumes (MOD) LV vol d, MOD A2C: 111.0 ml LV vol d, MOD A4C: 129.0 ml LV vol s, MOD A2C: 61.4 ml LV vol s, MOD A4C: 60.0 ml LV SV MOD A2C:     49.6 ml LV SV MOD A4C:     129.0 ml LV SV MOD BP:      59.1 ml RIGHT VENTRICLE RV Basal diam:  3.39 cm RV S prime:     11.30 cm/s TAPSE (M-mode): 4.5 cm LEFT ATRIUM            Index       RIGHT ATRIUM           Index LA diam:      4.50 cm  2.59 cm/m  RA Area:     15.20 cm LA Vol (A2C): 130.0 ml 74.93 ml/m RA Volume:   34.90 ml  20.12 ml/m LA Vol (A4C): 38.6 ml  22.25 ml/m   AORTIC VALVE                   PULMONIC VALVE AV Area (Vmax):    1.92 cm    PV Vmax:        0.47 m/s AV Area (Vmean):   2.04 cm    PV Peak grad:   0.9 mmHg AV Area (VTI):     2.49 cm    RVOT Peak grad: 3 mmHg AV Vmax:           130.50 cm/s AV Vmean:          92.850 cm/s AV VTI:            0.232 m AV Peak Grad:      6.8 mmHg AV Mean Grad:      4.0 mmHg LVOT Vmax:         72.50 cm/s LVOT Vmean:        54.800 cm/s LVOT VTI:          0.167 m LVOT/AV VTI ratio: 0.72  AORTA Ao Root diam: 2.60 cm MITRAL VALVE                TRICUSPID VALVE MV Area (PHT): 5.34 cm     TR Peak grad:   50.7 mmHg MV Decel Time: 142 msec     TR Vmax:        356.00 cm/s MV  E velocity: 102.00 cm/s MV A velocity: 41.10 cm/s   SHUNTS MV E/A ratio:  2.48         Systemic VTI:  0.17 m                             Systemic Diam: 2.10 cm Isaias Cowman MD Electronically signed by Isaias Cowman MD Signature Date/Time: 12/07/2019/4:48:11 PM    Final     (Echo, Carotid, EGD, Colonoscopy, ERCP)    Subjective: Patient resting in bed awake and alert anxious to go home denies any new complaints asking for some pain pills to take home  Discharge Exam: Vitals:   12/13/19 0409 12/13/19 0735  BP: (!) 133/80 (!) 140/76  Pulse: 91 85  Resp: 16 16  Temp: 98.6 F (37 C) 98.1 F (36.7 C)  SpO2: 100% 99%   Vitals:   12/13/19 0033 12/13/19 0052 12/13/19 0409 12/13/19 0735  BP: (!) 148/85  (!) 133/80 (!) 140/76  Pulse: 96  91 85  Resp:   16 16  Temp: 98 F (36.7 C)  98.6 F (37 C) 98.1 F (36.7 C)  TempSrc: Oral  Oral   SpO2: (!) 88% 93% 100% 99%  Weight:      Height:        General: Pt is alert, awake, not in acute distress Cardiovascular: RRR, S1/S2 +, no rubs, no gallops Respiratory: CTA bilaterally, no wheezing, no rhonchi Abdominal: Soft, NT, ND, bowel sounds + Extremities left foot is covered with a dressing  The results of significant diagnostics from this hospitalization (including imaging, microbiology,  ancillary and laboratory) are listed below for reference.     Microbiology: Recent Results (from the past 240 hour(s))  Culture, blood (routine x 2)     Status: None   Collection Time: 12/06/19  1:54 PM   Specimen: BLOOD  Result Value Ref Range Status   Specimen Description BLOOD LAC  Final   Special Requests   Final    BOTTLES DRAWN AEROBIC AND ANAEROBIC Blood Culture results may not be optimal due to an excessive volume of blood received in culture bottles   Culture   Final    NO GROWTH 5 DAYS Performed at [facility], [address] [place], [place] [address]    Report Status 12/11/2019 FINAL  Final  Culture, blood (routine x 2)     Status: None   Collection Time: 12/06/19  1:54 PM   Specimen: BLOOD  Result Value Ref Range Status   Specimen Description BLOOD L BICEP  Final   Special Requests   Final    BOTTLES DRAWN AEROBIC AND ANAEROBIC Blood Culture results may not be optimal due to an excessive volume of blood received in culture bottles   Culture   Final    NO GROWTH 5 DAYS Performed at [facility], [address] [place], [place] [address]    Report Status 12/11/2019 FINAL  Final  SARS Coronavirus 2 by RT PCR (hospital order, performed in [place] hospital lab) Nasopharyngeal Nasopharyngeal Swab     Status: None   Collection Time: 12/06/19  1:54 PM   Specimen: Nasopharyngeal Swab  Result Value Ref Range Status   SARS Coronavirus 2 NEGATIVE NEGATIVE Final    Comment: (NOTE) SARS-CoV-2 target nucleic acids are NOT DETECTED.  The SARS-CoV-2 RNA is generally detectable in upper and lower respiratory specimens during the acute phase of infection. The lowest concentration of SARS-CoV-2  viral copies this assay can detect is 250 copies / mL. A negative result does not preclude SARS-CoV-2 infection and should not be used as the sole basis for treatment or other patient management decisions.  A negative result may occur with improper specimen  collection / handling, submission of specimen other than nasopharyngeal swab, presence of viral mutation(s) within the areas targeted by this assay, and inadequate number of viral copies (<250 copies / mL). A negative result must be combined with clinical observations, patient history, and epidemiological information.  Fact Sheet for Patients:   StrictlyIdeas.no  Fact Sheet for Healthcare Providers: BankingDealers.co.za  This test is not yet approved or  cleared by the [place] FDA and has been authorized for detection and/or diagnosis of SARS-CoV-2 by FDA under an Emergency Use Authorization (EUA).  This EUA will remain in effect (meaning this test can be used) for the duration of the COVID-19 declaration under Section 564(b)(1) of the Act, 21 U.S.C. section 360bbb-3(b)(1), unless the authorization is terminated or revoked sooner.  Performed at [facility], [address] [place], [place] [address]   Aerobic/Anaerobic Culture (surgical/deep wound)     Status: None (Preliminary result)   Collection Time: 12/11/19 10:51 AM   Specimen: Wound  Result Value Ref Range Status   Specimen Description   Final    WOUND Performed at [facility], [address] [place], [place] [address]    Special Requests   Final    LEFT 2ND TOE BONE BX Performed at [facility], [address] [place], [place] [address]    Gram Stain   Final    FEW WBC PRESENT, PREDOMINANTLY PMN RARE GRAM POSITIVE RODS    Culture   Final    NO GROWTH < 24 HOURS Performed at [facility], [place] [address] [place],  [address]    Report Status PENDING  Incomplete     Labs: BNP (last 3 results) Recent Labs    07/05/19 2156 09/16/19 1939 12/07/19 0639  BNP 2,097.0* 1,162.0* >0,272.5*   Basic Metabolic Panel: Recent Labs  Lab 12/06/19 1248 12/06/19 1248 12/07/19 0639 12/10/19 0427 12/10/19 0921  12/11/19 0933 12/12/19 2313  NA 139  --  141 138  --  139  --   K 3.8  --  3.1* 3.8  --  4.4  --   CL 97*  --  101 102  --  101  --   CO2 30  --  29 26  --   --   --   GLUCOSE 53*   < > 288* 67* 74 359* 496*  BUN 46*  --  26* 29*  --  16  --   CREATININE 3.85*  --  2.62* 4.03*  --  3.10*  --   CALCIUM 8.9  --  7.8* 8.1*  --   --   --    < > = values in this interval not displayed.   Liver Function Tests: Recent Labs  Lab 12/06/19 1248  AST 43*  ALT 38  ALKPHOS 294*  BILITOT 0.7  PROT 7.4  ALBUMIN 3.4*   No results for input(s): LIPASE, AMYLASE in the last 168 hours. No results for input(s): AMMONIA in the last 168 hours. CBC: Recent Labs  Lab 12/06/19 1248 12/07/19 0639 12/09/19 0535 12/11/19 0933  WBC 7.0 4.4 5.3  --   HGB 9.5* 7.9* 8.4* 8.8*  HCT 29.9* 24.8* 25.2* 26.0*  MCV 98.4 96.1 95.1  --  PLT 230 173 154  --    Cardiac Enzymes: No results for input(s): CKTOTAL, CKMB, CKMBINDEX, TROPONINI in the last 168 hours. BNP: Invalid input(s): POCBNP CBG: Recent Labs  Lab 12/12/19 0832 12/12/19 1417 12/12/19 1623 12/12/19 2228 12/13/19 0829  GLUCAP 119* 90 94 421* 164*   D-Dimer No results for input(s): DDIMER in the last 72 hours. Hgb A1c No results for input(s): HGBA1C in the last 72 hours. Lipid Profile No results for input(s): CHOL, HDL, LDLCALC, TRIG, CHOLHDL, LDLDIRECT in the last 72 hours. Thyroid function studies No results for input(s): TSH, T4TOTAL, T3FREE, THYROIDAB in the last 72 hours.  Invalid input(s): FREET3 Anemia work up No results for input(s): VITAMINB12, FOLATE, FERRITIN, TIBC, IRON, RETICCTPCT in the last 72 hours. Urinalysis    Component Value Date/Time   COLORURINE YELLOW (A) 07/06/2019 0144   APPEARANCEUR CLOUDY (A) 07/06/2019 0144   APPEARANCEUR Clear 01/17/2014 1551   LABSPEC 1.017 07/06/2019 0144   LABSPEC 1.011 01/17/2014 1551   PHURINE 7.0 07/06/2019 0144   GLUCOSEU >=500 (A) 07/06/2019 0144   GLUCOSEU Negative  01/17/2014 1551   HGBUR SMALL (A) 07/06/2019 0144   BILIRUBINUR NEGATIVE 07/06/2019 0144   BILIRUBINUR Negative 01/17/2014 1551   KETONESUR NEGATIVE 07/06/2019 0144   PROTEINUR >=300 (A) 07/06/2019 0144   NITRITE NEGATIVE 07/06/2019 0144   LEUKOCYTESUR NEGATIVE 07/06/2019 0144   LEUKOCYTESUR 1+ 01/17/2014 1551   Sepsis Labs Invalid input(s): PROCALCITONIN,  WBC,  LACTICIDVEN Microbiology Recent Results (from the past 240 hour(s))  Culture, blood (routine x 2)     Status: None   Collection Time: 12/06/19  1:54 PM   Specimen: BLOOD  Result Value Ref Range Status   Specimen Description BLOOD LAC  Final   Special Requests   Final    BOTTLES DRAWN AEROBIC AND ANAEROBIC Blood Culture results may not be optimal due to an excessive volume of blood received in culture bottles   Culture   Final    NO GROWTH 5 DAYS Performed at [facility], [address] [place], [place] [address]    Report Status 12/11/2019 FINAL  Final  Culture, blood (routine x 2)     Status: None   Collection Time: 12/06/19  1:54 PM   Specimen: BLOOD  Result Value Ref Range Status   Specimen Description BLOOD L BICEP  Final   Special Requests   Final    BOTTLES DRAWN AEROBIC AND ANAEROBIC Blood Culture results may not be optimal due to an excessive volume of blood received in culture bottles   Culture   Final    NO GROWTH 5 DAYS Performed at [facility], [address] [place], [place] [address]    Report Status 12/11/2019 FINAL  Final  SARS Coronavirus 2 by RT PCR (hospital order, performed in [facility] hospital lab) Nasopharyngeal Nasopharyngeal Swab     Status: None   Collection Time: 12/06/19  1:54 PM   Specimen: Nasopharyngeal Swab  Result Value Ref Range Status   SARS Coronavirus 2 NEGATIVE NEGATIVE Final    Comment: (NOTE) SARS-CoV-2 target nucleic acids are NOT DETECTED.  The SARS-CoV-2 RNA is generally detectable in upper and lower respiratory specimens during the acute  phase of infection. The lowest concentration of SARS-CoV-2 viral copies this assay can detect is 250 copies / mL. A negative result does not preclude SARS-CoV-2 infection and should not be used as the sole basis for treatment or other patient management decisions.  A negative result may occur with improper  specimen collection / handling, submission of specimen other than nasopharyngeal swab, presence of viral mutation(s) within the areas targeted by this assay, and inadequate number of viral copies (<250 copies / mL). A negative result must be combined with clinical observations, patient history, and epidemiological information.  Fact Sheet for Patients:   StrictlyIdeas.no  Fact Sheet for Healthcare Providers: BankingDealers.co.za  This test is not yet approved or  cleared by the [place] FDA and has been authorized for detection and/or diagnosis of SARS-CoV-2 by FDA under an Emergency Use Authorization (EUA).  This EUA will remain in effect (meaning this test can be used) for the duration of the COVID-19 declaration under Section 564(b)(1) of the Act, 21 U.S.C. section 360bbb-3(b)(1), unless the authorization is terminated or revoked sooner.  Performed at [facility], [address] [place], [place] [address]   Aerobic/Anaerobic Culture (surgical/deep wound)     Status: None (Preliminary result)   Collection Time: 12/11/19 10:51 AM   Specimen: Wound  Result Value Ref Range Status   Specimen Description   Final    WOUND Performed at [facility], [address] [place], [place] [address]    Special Requests   Final    LEFT 2ND TOE BONE BX Performed at [facility], [address] [place], [place] [address]    Gram Stain   Final    FEW WBC PRESENT, PREDOMINANTLY PMN RARE GRAM POSITIVE RODS    Culture   Final    NO GROWTH < 24 HOURS Performed at [facility], [place] [address],  [place], [place] [address]    Report Status PENDING  Incomplete     Time coordinating discharge:  38 minutes  SIGNED:   Georgette Shell, MD  [facility] 12/13/2019, 9:18 AM

## 2019-12-13 NOTE — Evaluation (Signed)
Physical Therapy Evaluation Patient Details Name: [name] MRN: [id number] DOB: [birth year] Today's Date: 12/13/2019   History of Present Illness  59 year old male admitted due to osteomylitis. S/P amputation of 2nd toe. PMH includes: DM, ETOH, HTN, CVA  Clinical Impression  Patient received in bed, agreeable to PT session. Reports mod pain in left foot. Instructed patient and wife in WB status and to wear shoe when walking. Patient performed bed mobility independently. Transfers with min guard. Ambulated 25 feet with RW and min guard. Slightly unsteady with mobility. He is planning to discharge home today with wife, would benefit from HHPT to follow patient and increase safety and strength.     Follow Up Recommendations Home health PT    Equipment Recommendations  None recommended by PT    Recommendations for Other Services       Precautions / Restrictions Precautions Precautions: Fall Required Braces or Orthoses: Other Brace Other Brace: post op shoe Restrictions Weight Bearing Restrictions: Yes LLE Weight Bearing: Weight bearing as tolerated LLE Partial Weight Bearing Percentage or Pounds: WBAT through heel of left foot.      Mobility  Bed Mobility Overal bed mobility: Independent                Transfers Overall transfer level: Needs assistance Equipment used: Rolling walker (2 wheeled) Transfers: Sit to/from Stand Sit to Stand: Min guard            Ambulation/Gait Ambulation/Gait assistance: Min guard Gait Distance (Feet): 25 Feet Assistive device: Rolling walker (2 wheeled) Gait Pattern/deviations: Step-to pattern;Decreased weight shift to left Gait velocity: WFL   General Gait Details: patient ambulating with RW and post op shoe donned. Min guard for Scientist, research (medical)    Modified Rankin (Stroke Patients Only)       Balance Overall balance assessment: Modified Independent                                            Pertinent Vitals/Pain Pain Assessment: 0-10 Pain Score: 4  Pain Descriptors / Indicators: Sore Pain Intervention(s): Monitored during session;Repositioned    Home Living Family/patient expects to be discharged to:: Private residence Living Arrangements: Spouse/significant other Available Help at Discharge: Family;Available PRN/intermittently Type of Home: House Home Access: Level entry     Home Layout: One level Home Equipment: Grab bars - tub/shower;Grab bars - toilet;Cane - single point;Walker - 2 wheels      Prior Function Level of Independence: Needs assistance   Gait / Transfers Assistance Needed: Pt endorses using RW in the home, however, he also endorses multiple falls - wife and sister help with all meals           Hand Dominance   Dominant Hand: Left    Extremity/Trunk Assessment   Upper Extremity Assessment Upper Extremity Assessment: Overall WFL for tasks assessed    Lower Extremity Assessment Lower Extremity Assessment: Overall WFL for tasks assessed    Cervical / Trunk Assessment Cervical / Trunk Assessment: Normal  Communication   Communication: No difficulties  Cognition Arousal/Alertness: Awake/alert Behavior During Therapy: Agitated Overall Cognitive Status: Within Functional Limits for tasks assessed  General Comments: Patient swearing, not very cooperative      General Comments      Exercises     Assessment/Plan    PT Assessment Patient needs continued PT services  PT Problem List Decreased mobility;Decreased strength;Decreased activity tolerance;Decreased balance;Pain       PT Treatment Interventions Therapeutic activities;Gait training;Therapeutic exercise;Functional mobility training;Balance training;Patient/family education    PT Goals (Current goals can be found in the Care Plan section)  Acute Rehab PT Goals Patient Stated Goal: to return home PT  Goal Formulation: With family Time For Goal Achievement: 12/19/19 Potential to Achieve Goals: Good    Frequency Min 2X/week   Barriers to discharge Decreased caregiver support wife works during the day    Co-evaluation               AM-PAC PT "6 Clicks" Mobility  Outcome Measure Help needed turning from your back to your side while in a flat bed without using bedrails?: None Help needed moving from lying on your back to sitting on the side of a flat bed without using bedrails?: A Little Help needed moving to and from a bed to a chair (including a wheelchair)?: A Little Help needed standing up from a chair using your arms (e.g., wheelchair or bedside chair)?: A Little Help needed to walk in hospital room?: A Little Help needed climbing 3-5 steps with a railing? : A Little 6 Click Score: 19    End of Session Equipment Utilized During Treatment: Gait belt;Other (comment) (post op shoe) Activity Tolerance: Patient tolerated treatment well Patient left: in bed;with call bell/phone within reach;with family/visitor present Nurse Communication: Mobility status PT Visit Diagnosis: Unsteadiness on feet (R26.81);Difficulty in walking, not elsewhere classified (R26.2);Pain Pain - Right/Left: Left Pain - part of body: Ankle and joints of foot    Time: 8032-1224 PT Time Calculation (min) (ACUTE ONLY): 15 min   Charges:   PT Evaluation $PT Eval Moderate Complexity: 1 Mod          Kess Mcilwain, PT, GCS 12/13/19,11:26 AM

## 2019-12-13 NOTE — Plan of Care (Signed)
  Problem: Education: Goal: Knowledge of General Education information will improve Description: Including pain rating scale, medication(s)/side effects and non-pharmacologic comfort measures Outcome: Adequate for Discharge   Problem: Health Behavior/Discharge Planning: Goal: Ability to manage health-related needs will improve Outcome: Adequate for Discharge   Problem: Clinical Measurements: Goal: Ability to maintain clinical measurements within normal limits will improve Outcome: Adequate for Discharge Goal: Will remain free from infection Outcome: Adequate for Discharge Goal: Diagnostic test results will improve Outcome: Adequate for Discharge Goal: Respiratory complications will improve Outcome: Adequate for Discharge Goal: Cardiovascular complication will be avoided Outcome: Adequate for Discharge   Problem: Activity: Goal: Risk for activity intolerance will decrease Outcome: Adequate for Discharge   Problem: Nutrition: Goal: Adequate nutrition will be maintained Outcome: Adequate for Discharge   Problem: Coping: Goal: Level of anxiety will decrease Outcome: Adequate for Discharge   Problem: Elimination: Goal: Will not experience complications related to bowel motility Outcome: Adequate for Discharge Goal: Will not experience complications related to urinary retention Outcome: Adequate for Discharge   Problem: Pain Managment: Goal: General experience of comfort will improve Outcome: Adequate for Discharge   Problem: Safety: Goal: Ability to remain free from injury will improve Outcome: Adequate for Discharge   Problem: Skin Integrity: Goal: Risk for impaired skin integrity will decrease Outcome: Adequate for Discharge   Problem: Education: Goal: Knowledge of General Education information will improve Description: Including pain rating scale, medication(s)/side effects and non-pharmacologic comfort measures Outcome: Adequate for Discharge   Problem: Health  Behavior/Discharge Planning: Goal: Ability to manage health-related needs will improve Outcome: Adequate for Discharge   Problem: Clinical Measurements: Goal: Ability to maintain clinical measurements within normal limits will improve Outcome: Adequate for Discharge Goal: Will remain free from infection Outcome: Adequate for Discharge Goal: Diagnostic test results will improve Outcome: Adequate for Discharge Goal: Respiratory complications will improve Outcome: Adequate for Discharge Goal: Cardiovascular complication will be avoided Outcome: Adequate for Discharge   Problem: Activity: Goal: Risk for activity intolerance will decrease Outcome: Adequate for Discharge   Problem: Nutrition: Goal: Adequate nutrition will be maintained Outcome: Adequate for Discharge   Problem: Coping: Goal: Level of anxiety will decrease Outcome: Adequate for Discharge   Problem: Elimination: Goal: Will not experience complications related to bowel motility Outcome: Adequate for Discharge Goal: Will not experience complications related to urinary retention Outcome: Adequate for Discharge   Problem: Pain Managment: Goal: General experience of comfort will improve Outcome: Adequate for Discharge   Problem: Safety: Goal: Ability to remain free from injury will improve Outcome: Adequate for Discharge   Problem: Skin Integrity: Goal: Risk for impaired skin integrity will decrease Outcome: Adequate for Discharge   Problem: Education: Goal: Ability to demonstrate management of disease process will improve Outcome: Adequate for Discharge Goal: Ability to verbalize understanding of medication therapies will improve Outcome: Adequate for Discharge Goal: Individualized Educational Video(s) Outcome: Adequate for Discharge

## 2019-12-13 NOTE — Progress Notes (Signed)
MD order received in [facility] to discharge pt home today; verbally reviewed AVS with the pt and his wife; Dr Cleda Mccreedy previously updated the discharge instructions verbally with said RN indicating that the dressing should not get wet nor changed until the pt is seen in the office on Wednesday, 12/16/2019, reinforce the dressing if necessary; call Dr Sammuel Bailiff office on Monday, 12/14/2019 to schedule appt time for 12/16/2019; Rxs for Augmentin and Norco escribed to [facility] in [place], [place]; no questions voiced at this time; pt discharged via wheelchair by nursing to the [place] entrance

## 2019-12-14 NOTE — Anesthesia Postprocedure Evaluation (Signed)
Anesthesia Post Note  [name]  Procedure(s) Performed: AMPUTATION LEFT 2nd TOE (Left Toe)  Patient location during evaluation: PACU Anesthesia Type: General Level of consciousness: awake and alert and oriented Pain management: pain level controlled Vital Signs Assessment: post-procedure vital signs reviewed and stable Respiratory status: spontaneous breathing Cardiovascular status: blood pressure returned to baseline Anesthetic complications: no   No complications documented.   Last Vitals:  Vitals:   12/13/19 0409 12/13/19 0735  BP: (!) 133/80 (!) 140/76  Pulse: 91 85  Resp: 16 16  Temp: 37 C 36.7 C  SpO2: 100% 99%    Last Pain:  Vitals:   12/13/19 0849  TempSrc:   PainSc: 0-No pain                 Argel Pablo

## 2019-12-15 LAB — SURGICAL PATHOLOGY

## 2019-12-16 ENCOUNTER — Encounter (INDEPENDENT_AMBULATORY_CARE_PROVIDER_SITE_OTHER): Payer: Medicare Other

## 2019-12-16 ENCOUNTER — Ambulatory Visit (INDEPENDENT_AMBULATORY_CARE_PROVIDER_SITE_OTHER): Payer: Medicare Other | Admitting: Nurse Practitioner

## 2019-12-16 ENCOUNTER — Other Ambulatory Visit (INDEPENDENT_AMBULATORY_CARE_PROVIDER_SITE_OTHER): Payer: Self-pay | Admitting: Vascular Surgery

## 2019-12-16 DIAGNOSIS — I70262 Atherosclerosis of native arteries of extremities with gangrene, left leg: Secondary | ICD-10-CM

## 2019-12-16 DIAGNOSIS — Z95820 Peripheral vascular angioplasty status with implants and grafts: Secondary | ICD-10-CM

## 2019-12-16 DIAGNOSIS — Z95828 Presence of other vascular implants and grafts: Secondary | ICD-10-CM

## 2019-12-16 DIAGNOSIS — N186 End stage renal disease: Secondary | ICD-10-CM

## 2019-12-16 LAB — AEROBIC/ANAEROBIC CULTURE W GRAM STAIN (SURGICAL/DEEP WOUND): Culture: NO GROWTH

## 2019-12-17 ENCOUNTER — Encounter: Payer: Self-pay | Admitting: *Deleted

## 2019-12-17 ENCOUNTER — Ambulatory Visit: Payer: Medicare Other | Admitting: Podiatry

## 2019-12-18 ENCOUNTER — Encounter: Payer: Self-pay | Admitting: Podiatry

## 2019-12-24 ENCOUNTER — Observation Stay: Payer: Medicare Other

## 2019-12-24 ENCOUNTER — Other Ambulatory Visit: Payer: Self-pay

## 2019-12-24 ENCOUNTER — Inpatient Hospital Stay
Admission: EM | Admit: 2019-12-24 | Discharge: 2020-01-04 | DRG: 682 | Disposition: A | Discharge status: Home Health | Payer: Medicare Other | Source: Ambulatory Visit | Attending: Hospitalist | Admitting: Hospitalist

## 2019-12-24 ENCOUNTER — Encounter: Payer: Self-pay | Admitting: Emergency Medicine

## 2019-12-24 DIAGNOSIS — Z992 Dependence on renal dialysis: Secondary | ICD-10-CM

## 2019-12-24 DIAGNOSIS — D509 Iron deficiency anemia, unspecified: Secondary | ICD-10-CM | POA: Diagnosis not present

## 2019-12-24 DIAGNOSIS — R05 Cough: Secondary | ICD-10-CM

## 2019-12-24 DIAGNOSIS — K219 Gastro-esophageal reflux disease without esophagitis: Secondary | ICD-10-CM | POA: Diagnosis present

## 2019-12-24 DIAGNOSIS — R0902 Hypoxemia: Secondary | ICD-10-CM

## 2019-12-24 DIAGNOSIS — Z89422 Acquired absence of other left toe(s): Secondary | ICD-10-CM

## 2019-12-24 DIAGNOSIS — F1010 Alcohol abuse, uncomplicated: Secondary | ICD-10-CM | POA: Diagnosis present

## 2019-12-24 DIAGNOSIS — E1122 Type 2 diabetes mellitus with diabetic chronic kidney disease: Secondary | ICD-10-CM | POA: Diagnosis present

## 2019-12-24 DIAGNOSIS — F17290 Nicotine dependence, other tobacco product, uncomplicated: Secondary | ICD-10-CM | POA: Diagnosis present

## 2019-12-24 DIAGNOSIS — J441 Chronic obstructive pulmonary disease with (acute) exacerbation: Secondary | ICD-10-CM

## 2019-12-24 DIAGNOSIS — E11649 Type 2 diabetes mellitus with hypoglycemia without coma: Secondary | ICD-10-CM | POA: Diagnosis not present

## 2019-12-24 DIAGNOSIS — I639 Cerebral infarction, unspecified: Secondary | ICD-10-CM | POA: Diagnosis present

## 2019-12-24 DIAGNOSIS — E1165 Type 2 diabetes mellitus with hyperglycemia: Secondary | ICD-10-CM | POA: Diagnosis present

## 2019-12-24 DIAGNOSIS — IMO0002 Reserved for concepts with insufficient information to code with codable children: Secondary | ICD-10-CM | POA: Diagnosis present

## 2019-12-24 DIAGNOSIS — J9621 Acute and chronic respiratory failure with hypoxia: Secondary | ICD-10-CM

## 2019-12-24 DIAGNOSIS — Z79899 Other long term (current) drug therapy: Secondary | ICD-10-CM

## 2019-12-24 DIAGNOSIS — E785 Hyperlipidemia, unspecified: Secondary | ICD-10-CM | POA: Diagnosis present

## 2019-12-24 DIAGNOSIS — J690 Pneumonitis due to inhalation of food and vomit: Secondary | ICD-10-CM

## 2019-12-24 DIAGNOSIS — Z7982 Long term (current) use of aspirin: Secondary | ICD-10-CM

## 2019-12-24 DIAGNOSIS — R531 Weakness: Secondary | ICD-10-CM | POA: Diagnosis not present

## 2019-12-24 DIAGNOSIS — N186 End stage renal disease: Secondary | ICD-10-CM

## 2019-12-24 DIAGNOSIS — Z8673 Personal history of transient ischemic attack (TIA), and cerebral infarction without residual deficits: Secondary | ICD-10-CM

## 2019-12-24 DIAGNOSIS — E440 Moderate protein-calorie malnutrition: Secondary | ICD-10-CM | POA: Diagnosis present

## 2019-12-24 DIAGNOSIS — D649 Anemia, unspecified: Secondary | ICD-10-CM | POA: Diagnosis not present

## 2019-12-24 DIAGNOSIS — E119 Type 2 diabetes mellitus without complications: Secondary | ICD-10-CM

## 2019-12-24 DIAGNOSIS — Z9981 Dependence on supplemental oxygen: Secondary | ICD-10-CM

## 2019-12-24 DIAGNOSIS — J9801 Acute bronchospasm: Secondary | ICD-10-CM | POA: Diagnosis not present

## 2019-12-24 DIAGNOSIS — I120 Hypertensive chronic kidney disease with stage 5 chronic kidney disease or end stage renal disease: Secondary | ICD-10-CM | POA: Diagnosis not present

## 2019-12-24 DIAGNOSIS — E876 Hypokalemia: Secondary | ICD-10-CM | POA: Diagnosis present

## 2019-12-24 DIAGNOSIS — R0602 Shortness of breath: Secondary | ICD-10-CM

## 2019-12-24 DIAGNOSIS — Z794 Long term (current) use of insulin: Secondary | ICD-10-CM

## 2019-12-24 DIAGNOSIS — R197 Diarrhea, unspecified: Secondary | ICD-10-CM

## 2019-12-24 DIAGNOSIS — N2581 Secondary hyperparathyroidism of renal origin: Secondary | ICD-10-CM | POA: Diagnosis present

## 2019-12-24 DIAGNOSIS — Z20822 Contact with and (suspected) exposure to covid-19: Secondary | ICD-10-CM | POA: Diagnosis present

## 2019-12-24 DIAGNOSIS — R059 Cough, unspecified: Secondary | ICD-10-CM

## 2019-12-24 DIAGNOSIS — D631 Anemia in chronic kidney disease: Secondary | ICD-10-CM | POA: Diagnosis present

## 2019-12-24 LAB — GLUCOSE, CAPILLARY
Glucose-Capillary: 257 mg/dL — ABNORMAL HIGH (ref 70–99)
Glucose-Capillary: 302 mg/dL — ABNORMAL HIGH (ref 70–99)
Glucose-Capillary: 256 mg/dL — ABNORMAL HIGH (ref 70–99)

## 2019-12-24 LAB — FOLATE: Folate: 80 ng/mL (ref >5.9)

## 2019-12-24 LAB — BASIC METABOLIC PANEL
Anion gap: 11 (ref 5–15)
BUN: 20 mg/dL (ref 6–20)
CO2: 32 mmol/L (ref 22–32)
Calcium: 7.9 mg/dL — ABNORMAL LOW (ref 8.9–10.3)
Chloride: 97 mmol/L — ABNORMAL LOW (ref 98–111)
Creatinine, Ser: 2.54 mg/dL — ABNORMAL HIGH (ref 0.61–1.24)
GFR calc Af Amer: 31 mL/min — ABNORMAL LOW (ref >60)
GFR calc non Af Amer: 26 mL/min — ABNORMAL LOW (ref >60)
Glucose, Bld: 277 mg/dL — ABNORMAL HIGH (ref 70–99)
Potassium: 3.4 mmol/L — ABNORMAL LOW (ref 3.5–5.1)
Sodium: 140 mmol/L (ref 135–145)

## 2019-12-24 LAB — IRON AND TIBC
Iron: 42 ug/dL — ABNORMAL LOW (ref 45–182)
Saturation Ratios: 16 % — ABNORMAL LOW (ref 17.9–39.5)
TIBC: 269 ug/dL (ref 250–450)
UIBC: 227 ug/dL

## 2019-12-24 LAB — CBC
HCT: 20.8 % — ABNORMAL LOW (ref 39.0–52.0)
Hemoglobin: 6.6 g/dL — ABNORMAL LOW (ref 13.0–17.0)
MCH: 30.4 pg (ref 26.0–34.0)
MCHC: 31.7 g/dL (ref 30.0–36.0)
MCV: 95.9 fL (ref 80.0–100.0)
Platelets: 257 10*3/uL (ref 150–400)
RBC: 2.17 MIL/uL — ABNORMAL LOW (ref 4.22–5.81)
RDW: 15.4 % (ref 11.5–15.5)
WBC: 4.9 10*3/uL (ref 4.0–10.5)
nRBC: 0 % (ref 0.0–0.2)

## 2019-12-24 LAB — RETICULOCYTES
Immature Retic Fract: 25.9 % — ABNORMAL HIGH (ref 2.3–15.9)
RBC.: 2.26 MIL/uL — ABNORMAL LOW (ref 4.22–5.81)
Retic Count, Absolute: 132 10*3/uL (ref 19.0–186.0)
Retic Ct Pct: 5.8 % — ABNORMAL HIGH (ref 0.4–3.1)

## 2019-12-24 LAB — PREPARE RBC (CROSSMATCH)

## 2019-12-24 LAB — FERRITIN: Ferritin: 136 ng/mL (ref 24–336)

## 2019-12-24 MED ORDER — PANTOPRAZOLE SODIUM 40 MG IV SOLR
40.0000 mg | Freq: Two times a day (BID) | INTRAVENOUS | Status: DC
  Administered 2019-12-24 – 2020-01-04 (×20): 40 mg via INTRAVENOUS
  Filled 2019-12-24 (×19): qty 40

## 2019-12-24 MED ORDER — SODIUM CHLORIDE 0.9 % IV SOLN: 10.0000 mL/h | Freq: Once | INTRAVENOUS | Status: DC

## 2019-12-24 MED ORDER — ONDANSETRON HCL 4 MG PO TABS: 4.0000 mg | ORAL_TABLET | Freq: Four times a day (QID) | ORAL | Status: DC | PRN

## 2019-12-24 MED ORDER — INSULIN ASPART 100 UNIT/ML ~~LOC~~ SOLN
0.0000 [IU] | Freq: Three times a day (TID) | SUBCUTANEOUS | Status: DC
  Administered 2019-12-25: 1 [IU] via SUBCUTANEOUS
  Administered 2019-12-26: 9 [IU] via SUBCUTANEOUS
  Administered 2019-12-26: 3 [IU] via SUBCUTANEOUS
  Administered 2019-12-27: 9 [IU] via SUBCUTANEOUS
  Administered 2019-12-27: 5 [IU] via SUBCUTANEOUS
  Administered 2019-12-28: 9 [IU] via SUBCUTANEOUS
  Administered 2019-12-29: 3 [IU] via SUBCUTANEOUS
  Administered 2019-12-29: 5 [IU] via SUBCUTANEOUS
  Administered 2019-12-30: 2 [IU] via SUBCUTANEOUS
  Administered 2019-12-30: 3 [IU] via SUBCUTANEOUS
  Administered 2020-01-01: 5 [IU] via SUBCUTANEOUS
  Filled 2019-12-24 (×13): qty 1

## 2019-12-24 MED ORDER — INSULIN ASPART 100 UNIT/ML ~~LOC~~ SOLN
0.0000 [IU] | Freq: Every day | SUBCUTANEOUS | Status: DC
  Administered 2019-12-24: 3 [IU] via SUBCUTANEOUS
  Administered 2019-12-26: 5 [IU] via SUBCUTANEOUS
  Administered 2019-12-27: 3 [IU] via SUBCUTANEOUS
  Administered 2019-12-28: 4 [IU] via SUBCUTANEOUS
  Administered 2019-12-29: 2 [IU] via SUBCUTANEOUS
  Filled 2019-12-24 (×4): qty 1

## 2019-12-24 MED ORDER — ACETAMINOPHEN 325 MG PO TABS
650.0000 mg | ORAL_TABLET | Freq: Four times a day (QID) | ORAL | Status: DC | PRN
  Administered 2019-12-27 – 2019-12-30 (×3): 650 mg via ORAL
  Filled 2019-12-24 (×3): qty 2

## 2019-12-24 MED ORDER — INSULIN GLARGINE 100 UNIT/ML ~~LOC~~ SOLN
10.0000 [IU] | Freq: Every day | SUBCUTANEOUS | Status: DC
  Administered 2019-12-24: 10 [IU] via SUBCUTANEOUS
  Filled 2019-12-24 (×2): qty 0.1

## 2019-12-24 MED ORDER — ONDANSETRON HCL 4 MG/2ML IJ SOLN
4.0000 mg | Freq: Four times a day (QID) | INTRAMUSCULAR | Status: DC | PRN
  Administered 2019-12-29: 4 mg via INTRAVENOUS
  Filled 2019-12-24: qty 2

## 2019-12-24 MED ORDER — ACETAMINOPHEN 650 MG RE SUPP: 650.0000 mg | Freq: Four times a day (QID) | RECTAL | Status: DC | PRN

## 2019-12-24 NOTE — ED Notes (Signed)
EDP at [facility]. Pt st diarrhea w/o melena. Pt answering question to [place] with this Rn at bedside.

## 2019-12-24 NOTE — ED Notes (Signed)
[facility] t beside performing a hemoccult  With this Rn at bedside. [name]

## 2019-12-24 NOTE — ED Notes (Signed)
Pt to xray

## 2019-12-24 NOTE — ED Triage Notes (Signed)
First Nurse Note: Arrives from dialysis for blood transfusion.  Per wife, HGB:  6.6.  Patient is AAOx3.  Skin warm and dry. NAD

## 2019-12-24 NOTE — ED Notes (Addendum)
Pt provided with a dinner meal tray at beside and fluids.

## 2019-12-24 NOTE — ED Notes (Signed)
Oxygen 99% on 2L [place].

## 2019-12-24 NOTE — ED Triage Notes (Signed)
Pt here with c/o weakness, fatigue, shob worsening over the past few days, states he was at dialysis and was unable to finish, went home, then his wife brought him here. States his blood level (hgb) is low 6.6. Pt is diabetic. NAD.

## 2019-12-24 NOTE — ED Provider Notes (Signed)
[facility] Emergency Department Provider Note   ____________________________________________   First MD Initiated Contact with Patient 12/24/19 1512     (approximate)  I have reviewed the triage vital signs and the nursing notes.   HISTORY  Chief Complaint Weakness and Shortness of Breath    HPI [name] is a 59 y.o. male who comes in complaining of feeling very weak.  He reports he has had diarrhea at least daily for 6 months.  Was unsure why.  Diarrhea is brown and watery.  Patient gets dialysis.  He had dialysis today.  He is again very weak.  H&H is low.  He denies any chest tightness shortness of breath or other problems.         Past Medical History:  Diagnosis Date  . Chronic kidney disease   . Diabetes mellitus without complication ([place])   . ETOH abuse   . GERD (gastroesophageal reflux disease)   . Hyperlipidemia   . Hypertension   . Stroke [facility])     Patient Active Problem List   Diagnosis Date Noted  . Diabetic foot infection ([place]) 12/07/2019  . Acute pulmonary edema ([place]) 12/07/2019  . Paronychia of second toe of left foot 11/09/2019  . Chronic osteomyelitis of toe of left foot ([place]) 11/09/2019  . Uncontrolled type 2 diabetes mellitus with hyperosmolar nonketotic hyperglycemia ([place]) 09/16/2019  . Hypoglycemia associated with diabetes ([place]) 08/24/2019  . Acute respiratory failure with hypoxia ([place])   . Acute metabolic encephalopathy 25/42/7062  . Hypertensive emergency 07/06/2019  . Acute encephalopathy 07/06/2019  . Seizures due to metabolic disorder ([place]) 37/62/8315  . Hyperosmolar hyperglycemic state (HHS) ([place]) 06/05/2019  . Anemia 06/04/2019  . Frequent falls 06/04/2019  . History of CVA (cerebrovascular accident) 06/04/2019  . HCAP (healthcare-associated pneumonia) 06/04/2019  . Acute blood loss anemia 06/04/2019  . Hyperglycemia 05/28/2019  . Fall at home, initial encounter 05/28/2019  . Rib fracture 05/28/2019   . Depression 05/28/2019  . Hypokalemia 05/28/2019  . Weakness 05/27/2019  . ESRD on hemodialysis ([place])   . Acute renal failure (ARF) ([place]) 04/09/2019  . Polyp of ascending colon   . Diarrhea   . AKI (acute kidney injury) ([place]) 03/04/2019  . Acute kidney injury ([place]) 07/26/2018  . ARF (acute renal failure) ([place]) 07/25/2018  . Bilateral leg numbness 03/19/2018  . Numbness and tingling of both feet 03/19/2018  . CVA (cerebral vascular accident) ([place]) 03/17/2018  . Moderate episode of recurrent major depressive disorder ([place]) 03/11/2018  . UTI (urinary tract infection) 02/27/2018  . Hypoglycemia 01/15/2018  . Type 2 diabetes mellitus without complication, with long-term current use of insulin ([place]) 08/27/2017  . Protein-calorie malnutrition, severe 08/19/2017  . Pancreatitis, acute 08/16/2017  . DKA (diabetic ketoacidoses) ([place]) 08/16/2017  . HTN (hypertension) 08/16/2017  . HLD (hyperlipidemia) 08/16/2017  . Carotid stenosis 08/02/2016  . GERD (gastroesophageal reflux disease) 08/02/2016  . History of esophagogastroduodenoscopy (EGD) 07/01/2016  . History of recent blood transfusion 07/01/2016  . Acute renal failure superimposed on stage 4 chronic kidney disease ([place]) 07/01/2016  . Hyponatremia 07/01/2016  . Uncontrolled diabetes mellitus ([place]) 07/01/2016  . Alcohol abuse 07/01/2016  . Monilial esophagitis ([place]) 07/01/2016  . Tobacco abuse 07/01/2016  . Confusion 06/29/2016  . Iron deficiency anemia 06/29/2016  . Coagulopathy ([place]) 06/29/2016    Past Surgical History:  Procedure Laterality Date  . AMPUTATION TOE Left 12/11/2019   Procedure: AMPUTATION LEFT 2nd TOE;  Surgeon: Sharlotte Alamo, DPM;  Location: [facility] ORS;  Service: Podiatry;  Laterality: Left;  . AV FISTULA PLACEMENT Right 11/25/2019   Procedure: INSERTION OF ARTERIOVENOUS (AV) GORE-TEX GRAFT ARM;  Surgeon: Katha Cabal, MD;  Location: [facility] ORS;  Service: Vascular;  Laterality: Right;  . COLONOSCOPY WITH PROPOFOL  N/A 03/06/2019   Procedure: COLONOSCOPY WITH PROPOFOL;  Surgeon: Lucilla Lame, MD;  Location: [facility] ENDOSCOPY;  Service: Endoscopy;  Laterality: N/A;  . DIALYSIS/PERMA CATHETER INSERTION N/A 04/10/2019   Procedure: DIALYSIS/PERMA CATHETER INSERTION;  Surgeon: Serafina Mitchell, MD;  Location: [place] CV LAB;  Service: Cardiovascular;  Laterality: N/A;  . ESOPHAGOGASTRODUODENOSCOPY (EGD) WITH PROPOFOL N/A 07/01/2016   Procedure: ESOPHAGOGASTRODUODENOSCOPY (EGD) WITH PROPOFOL;  Surgeon: San Jetty, MD;  Location: [facility] ENDOSCOPY;  Service: General;  Laterality: N/A;  . LOWER EXTREMITY ANGIOGRAPHY Left 12/10/2019   Procedure: Lower Extremity Angiography;  Surgeon: Algernon Huxley, MD;  Location: [place] CV LAB;  Service: Cardiovascular;  Laterality: Left;    Prior to Admission medications   Medication Sig Start Date End Date Taking? Authorizing Provider  Amino Acid Infusion (PROSOL) 20 % SOLN Inject into the vein. 11/28/19   [provider]  amoxicillin-clavulanate (AUGMENTIN) 500-125 MG tablet Take 1 tablet (500 mg total) by mouth 2 (two) times daily. 12/13/19   Georgette Shell, MD  aspirin EC 81 MG EC tablet Take 1 tablet (81 mg total) by mouth daily. 06/10/19   Jennye Boroughs, MD  carvedilol (COREG) 6.25 MG tablet Take 6.25 mg by mouth 2 (two) times daily with a meal.  10/28/19   [provider]  ENTRESTO 24-26 MG Take 1 tablet by mouth 2 (two) times daily. 07/29/19   [provider]  folic acid (FOLVITE) 1 MG tablet Take 1 mg by mouth daily. 03/12/19   [provider]  hydrALAZINE (APRESOLINE) 25 MG tablet Take 1 tablet (25 mg total) by mouth every 8 (eight) hours. Patient taking differently: Take 25 mg by mouth in the morning and at bedtime.  07/08/19   Ezekiel Slocumb, DO  HYDROcodone-acetaminophen (NORCO/VICODIN) 5-325 MG tablet Take 1 tablet by mouth every 4 (four) hours as needed for moderate pain. 12/13/19   Georgette Shell, MD  insulin  glargine (LANTUS) 100 UNIT/ML injection Inject 0.1 mLs (10 Units total) into the skin daily. Patient taking differently: Inject 20 Units into the skin daily.  06/09/19   Jennye Boroughs, MD  insulin lispro (HUMALOG KWIKPEN) 100 UNIT/ML KwikPen Inject 0-0.09 mLs (0-9 Units total) into the skin 3 (three) times daily with meals. According to sliding scale Patient taking differently: Inject 0-15 Units into the skin 3 (three) times daily with meals. According to sliding scale 07/08/19   Nicole Kindred A, DO  Melatonin 3 MG TABS Take 1 tablet by mouth at bedtime. 05/23/19   [provider]  multivitamin (RENA-VIT) TABS tablet Take 1 tablet by mouth at bedtime. 07/08/19   Ezekiel Slocumb, DO  Nutritional Supplements (FEEDING SUPPLEMENT, NEPRO CARB STEADY,) LIQD Take 237 mLs by mouth 3 (three) times daily between meals. 07/08/19   Ezekiel Slocumb, DO  pantoprazole (PROTONIX) 40 MG tablet Take 40 mg by mouth daily.  06/17/19   [provider]  sertraline (ZOLOFT) 100 MG tablet Take 100 mg by mouth daily.  06/17/19   [provider]  thiamine (VITAMIN B-1) 100 MG tablet Take 100 mg by mouth daily.    [provider]    Allergies Ferrous gluconate and Other  Family History  Problem Relation Age of Onset  .  CAD Brother   . Dementia Mother   . Renal Disease Father     Social History Social History   Tobacco Use  . Smoking status: Current Every Day Smoker    Packs/day: 0.25    Years: 5.00    Pack years: 1.25    Types: Cigars  . Smokeless tobacco: Never Used  Vaping Use  . Vaping Use: Never used  Substance Use Topics  . Alcohol use: Yes    Alcohol/week: 16.0 standard drinks    Types: 14 Cans of beer, 2 Shots of liquor per week  . Drug use: No    Review of Systems  Constitutional: No fever/chills Eyes: No visual changes. ENT: No sore throat. Cardiovascular: Denies chest pain. Respiratory: Denies shortness of breath. Gastrointestinal: No abdominal pain.   No nausea, no vomiting.   diarrhea.  No constipation. Genitourinary: Negative for dysuria. Musculoskeletal: Negative for back pain. Skin: Negative for rash. Neurological: Negative for headaches, focal weakness    ____________________________________________   PHYSICAL EXAM:  VITAL SIGNS: ED Triage Vitals  Enc Vitals Group     BP 12/24/19 1205 130/83     Pulse Rate 12/24/19 1205 93     Resp 12/24/19 1205 17     Temp 12/24/19 1205 98.7 F (37.1 C)     Temp Source 12/24/19 1205 Oral     SpO2 12/24/19 1205 94 %     Weight 12/24/19 1208 135 lb (61.2 kg)     Height 12/24/19 1208 5\' 11"  (1.803 m)     Head Circumference --      Peak Flow --      Pain Score 12/24/19 1206 0     Pain Loc --      Pain Edu? --      Excl. in [place]? --     Constitutional: Alert and oriented.  Looks tired and weak and skinny Eyes: Conjunctivae are normal.  Head: Atraumatic. Nose: No congestion/rhinnorhea. Mouth/Throat: Mucous membranes are moist.  Oropharynx non-erythematous. Neck: No stridor.  Cardiovascular: Normal rate, regular rhythm. Grossly normal heart sounds.  Good peripheral circulation. Respiratory: Normal respiratory effort.  No retractions. Lungs CTAB. Gastrointestinal: Soft and nontender. No distention. No abdominal bruits. No CVA tenderness. Musculoskeletal: No lower extremity tenderness nor edema.  Left foot with partial amputation of toes.  Not the great toe Neurologic:  Normal speech and language. No gross focal neurologic deficits are appreciated. No gait instability. Skin:  Skin is warm, dry and intact. No rash noted. Rectal: No masses no palpable hemorrhoids patient does have slight trace positive Hemoccult.  ____________________________________________   LABS (all labs ordered are listed, but only abnormal results are displayed)  Labs Reviewed  BASIC METABOLIC PANEL - Abnormal; Notable for the following components:      Result Value   Potassium 3.4 (*)    Chloride 97 (*)     Glucose, Bld 277 (*)    Creatinine, Ser 2.54 (*)    Calcium 7.9 (*)    GFR calc non Af Amer 26 (*)    GFR calc Af Amer 31 (*)    All other components within normal limits  CBC - Abnormal; Notable for the following components:   RBC 2.17 (*)    Hemoglobin 6.6 (*)    HCT 20.8 (*)    All other components within normal limits  GLUCOSE, CAPILLARY - Abnormal; Notable for the following components:   Glucose-Capillary 257 (*)    All other components within normal limits  GLUCOSE, CAPILLARY -  Abnormal; Notable for the following components:   Glucose-Capillary 302 (*)    All other components within normal limits  GASTROINTESTINAL PANEL BY PCR, STOOL (REPLACES STOOL CULTURE)  C DIFFICILE QUICK SCREEN W PCR REFLEX  URINALYSIS, COMPLETE (UACMP) WITH MICROSCOPIC  CBG MONITORING, ED  PREPARE RBC (CROSSMATCH)  TYPE AND SCREEN   ____________________________________________  EKG   ____________________________________________  RADIOLOGY  ED MD interpretation:  Official radiology report(s): No results found.  ____________________________________________   PROCEDURES  Procedure(s) performed (including Critical Care):  Procedures   ____________________________________________   INITIAL IMPRESSION / ASSESSMENT AND PLAN / ED COURSE  Patient with symptomatic anemia.  He also has a slightly positive stool for blood.  His history of diarrhea for 6 months will need investigation from GI.  Could be related to a bleed or parasitic infection of some sort like Giardia or possibly to a malabsorptive process like celiac disease.            ____________________________________________   FINAL CLINICAL IMPRESSION(S) / ED DIAGNOSES  Final diagnoses:  Symptomatic anemia  Weakness  Diarrhea, unspecified type     ED Discharge Orders    None       Note:  This document was prepared using Dragon voice recognition software and may include unintentional dictation errors.     Nena Polio, MD 12/25/19 Shelah Lewandowsky

## 2019-12-24 NOTE — ED Notes (Signed)
Pt provided status update to pt.

## 2019-12-24 NOTE — H&P (Signed)
History and Physical  [name]:[id number] DOB: 02-26-1961 DOA: 12/24/2019  PCP: [name], MD Patient coming from: Home  I have personally briefly reviewed patient's old medical records in [employer]   Chief Complaint: Abnormal lab, Low Hb at 6 and SOB.   HPI: [name] is a 59 y.o. male past medical history significant for diabetes, stroke, hypertension, history of alcohol use, [name], Thursday and Saturday, [name]  Hemoglobin was at 6.  Patient reports a history of shortness of breath on exertion for the last 3 to 4 months.  He also reported history of episode of  black stool a week prior to admission.  He denies abdominal pain, hematochezia,  hematemesis.  He also report a history of chronic diarrhea, loose stool worse over the last -week.  He report watery stool 2 times a day for the last week.  He report productive cough for the last couple of months. Off Note patient had left toe amputation 2 weeks prior to admission.  Evaluation in the ED; sodium 140, potassium 3.4, glucose 277, creatinine 2.5, calcium 7.9, hemoglobin 6.6, platelet 257, 2 unit of packed red blood cell has been ordered.  Occult blood positive per ED  Review of Systems: All systems reviewed and apart from history of presenting illness, are negative.  Past Medical History:  Diagnosis Date  . Chronic kidney disease   . Diabetes mellitus without complication ([place])   . ETOH abuse   . GERD (gastroesophageal reflux disease)   . Hyperlipidemia   . Hypertension   . Stroke [facility])    Past Surgical History:  Procedure Laterality Date  . AMPUTATION TOE Left 12/11/2019   Procedure: AMPUTATION LEFT 2nd TOE;  Surgeon: Sharlotte Alamo, DPM;  Location: [facility] ORS;  Service: Podiatry;  Laterality: Left;  . AV FISTULA PLACEMENT Right 11/25/2019    Procedure: INSERTION OF ARTERIOVENOUS (AV) GORE-TEX GRAFT ARM;  Surgeon: Katha Cabal, MD;  Location: [facility] ORS;  Service: Vascular;  Laterality: Right;  . COLONOSCOPY WITH PROPOFOL N/A 03/06/2019   Procedure: COLONOSCOPY WITH PROPOFOL;  Surgeon: Lucilla Lame, MD;  Location: [facility] ENDOSCOPY;  Service: Endoscopy;  Laterality: N/A;  . DIALYSIS/PERMA CATHETER INSERTION N/A 04/10/2019   Procedure: DIALYSIS/PERMA CATHETER INSERTION;  Surgeon: Serafina Mitchell, MD;  Location: [place] CV LAB;  Service: Cardiovascular;  Laterality: N/A;  . ESOPHAGOGASTRODUODENOSCOPY (EGD) WITH PROPOFOL N/A 07/01/2016   Procedure: ESOPHAGOGASTRODUODENOSCOPY (EGD) WITH PROPOFOL;  Surgeon: San Jetty, MD;  Location: [facility] ENDOSCOPY;  Service: General;  Laterality: N/A;  . LOWER EXTREMITY ANGIOGRAPHY Left 12/10/2019   Procedure: Lower Extremity Angiography;  Surgeon: Algernon Huxley, MD;  Location: [place] CV LAB;  Service: Cardiovascular;  Laterality: Left;   Social History:  reports that he has been smoking cigars. He has a 1.25 pack-year smoking history. He has never used smokeless tobacco. He reports current alcohol use of about 16.0 standard drinks of alcohol per week. He reports that he does not use drugs.   Allergies  Allergen Reactions  . Ferrous Gluconate Nausea And Vomiting  . Other     Family History  Problem Relation Age of Onset  . CAD Brother   . Dementia Mother   . Renal Disease Father      Prior to Admission medications   Medication Sig Start Date End Date Taking? Authorizing Provider  Amino Acid Infusion (PROSOL) 20 % SOLN Inject into the vein. 11/28/19   [provider]  amoxicillin-clavulanate (AUGMENTIN) 500-125 MG tablet Take 1 tablet (500 mg total) by mouth 2 (two) times daily. 12/13/19   Georgette Shell, MD  aspirin EC 81 MG EC tablet Take 1 tablet (81 mg total) by mouth daily. 06/10/19   Jennye Boroughs, MD  carvedilol (COREG) 6.25 MG tablet Take 6.25 mg by mouth 2  (two) times daily with a meal.  10/28/19   [provider]  ENTRESTO 24-26 MG Take 1 tablet by mouth 2 (two) times daily. 07/29/19   [provider]  folic acid (FOLVITE) 1 MG tablet Take 1 mg by mouth daily. 03/12/19   [provider]  hydrALAZINE (APRESOLINE) 25 MG tablet Take 1 tablet (25 mg total) by mouth every 8 (eight) hours. Patient taking differently: Take 25 mg by mouth in the morning and at bedtime.  07/08/19   Ezekiel Slocumb, DO  HYDROcodone-acetaminophen (NORCO/VICODIN) 5-325 MG tablet Take 1 tablet by mouth every 4 (four) hours as needed for moderate pain. 12/13/19   Georgette Shell, MD  insulin glargine (LANTUS) 100 UNIT/ML injection Inject 0.1 mLs (10 Units total) into the skin daily. Patient taking differently: Inject 20 Units into the skin daily.  06/09/19   Jennye Boroughs, MD  insulin lispro (HUMALOG KWIKPEN) 100 UNIT/ML KwikPen Inject 0-0.09 mLs (0-9 Units total) into the skin 3 (three) times daily with meals. According to sliding scale Patient taking differently: Inject 0-15 Units into the skin 3 (three) times daily with meals. According to sliding scale 07/08/19   Nicole Kindred A, DO  Melatonin 3 MG TABS Take 1 tablet by mouth at bedtime. 05/23/19   [provider]  multivitamin (RENA-VIT) TABS tablet Take 1 tablet by mouth at bedtime. 07/08/19   Ezekiel Slocumb, DO  Nutritional Supplements (FEEDING SUPPLEMENT, NEPRO CARB STEADY,) LIQD Take 237 mLs by mouth 3 (three) times daily between meals. 07/08/19   Ezekiel Slocumb, DO  pantoprazole (PROTONIX) 40 MG tablet Take 40 mg by mouth daily.  06/17/19   [provider]  sertraline (ZOLOFT) 100 MG tablet Take 100 mg by mouth daily.  06/17/19   [provider]  thiamine (VITAMIN B-1) 100 MG tablet Take 100 mg by mouth daily.    [provider]   Physical Exam: Vitals:   12/24/19 1213 12/24/19 1458 12/24/19 1530 12/24/19 1659  BP:  117/79 131/69 128/79  Pulse:  84 82 93   Resp:  18    Temp:      TempSrc:      SpO2: 99% 100% 100% 100%  Weight:      Height:         General exam: Chronic ill-appearing, lying comfortably supine on the gurney in no obvious distress.  Head, eyes and ENT: Nontraumatic and normocephalic. Pupils equally reacting to light and accommodation. Oral mucosa moist.  Neck: Supple. No JVD, carotid bruit or thyromegaly.  Lymphatics: No lymphadenopathy.  Respiratory system: Bilateral rhonchorous, no increased work of breathing.  Cardiovascular system: S1 and S2 heard, RRR. No JVD, murmurs, gallops, clicks or pedal edema.  Gastrointestinal system: Abdomen is nondistended, soft and nontender. Normal bowel sounds heard. No organomegaly or masses appreciated.  Central nervous system: Alert and oriented.   Extremities: Left lower extremity with dressing  Skin: No rashes or acute findings.  Musculoskeletal system: Negative exam.  Psychiatry: Pleasant and cooperative.   Labs on Admission:  Basic Metabolic  Panel: Recent Labs  Lab 12/24/19 1229  NA 140  K 3.4*  CL 97*  CO2 32  GLUCOSE 277*  BUN 20  CREATININE 2.54*  CALCIUM 7.9*   Liver Function Tests: No results for input(s): AST, ALT, ALKPHOS, BILITOT, PROT, ALBUMIN in the last 168 hours. No results for input(s): LIPASE, AMYLASE in the last 168 hours. No results for input(s): AMMONIA in the last 168 hours. CBC: Recent Labs  Lab 12/24/19 1229  WBC 4.9  HGB 6.6*  HCT 20.8*  MCV 95.9  PLT 257   Cardiac Enzymes: No results for input(s): CKTOTAL, CKMB, CKMBINDEX, TROPONINI in the last 168 hours.  BNP (last 3 results) No results for input(s): PROBNP in the last 8760 hours. CBG: Recent Labs  Lab 12/24/19 1228 12/24/19 1553  GLUCAP 257* 302*    Radiological Exams on Admission: No results found.  EKG: Independently reviewed.  Sinus rhythm left axis deviation  Assessment/Plan Active Problems:   Iron deficiency anemia   Uncontrolled diabetes mellitus  (HCC)   CVA (cerebral vascular accident) ([place])   Type 2 diabetes mellitus without complication, with long-term current use of insulin (HCC)   ESRD (end stage renal disease) ([place])   Anemia   1-Severe anemia, Symptomatic:  Presented with a hemoglobin of 6, reports shortness of breath on exertion. Concern for GI bleed.  Report of melena 2 unit of packed red blood cell has been ordered. IV Protonix twice daily. GI has been consulted. Anemia panel Ordered.   2-Episode of melena: Patient reported episode of black stool last week. Check anemia panel GI consulted Occult blood positive per ED physician IV Protonix twice daily  3-Diarrhea: GI pathogen and C diff ordered.   4-ESRD; on HD Tues, Thu, Sat; Had Hd today.  Nephrology consultation tomorrow  5-Mild hypokalemia. Monitor.   6-Cough: check chest x ray.  7-Diabetes type 2; uncontrolled. Hyperglycemia;  Resume lantus. SSI.      DVT Prophylaxis: SCDs, no anticoagulation due to low hemoglobin and concern for GI bleed Code Status: Full code Family Communication: Care discussed with patient Disposition Plan: Admit for blood transfusion, further evaluation of GI bleed.  Time spent: 75 minutes.   Elmarie Shiley MD [facility]   12/24/2019, 6:05 PM

## 2019-12-24 NOTE — ED Notes (Signed)
IV team at bedside 

## 2019-12-24 NOTE — ED Notes (Signed)
Pt states he has chronic diarrhea, has for 6 months now, states his dr isn't giving him anything for it.

## 2019-12-24 NOTE — ED Notes (Signed)
Pt st dialysis Tuesday, Thrusday and Saturday. [facility] for "months" with a cough for 6 months.  Amputation of left toe "2 weeks ago".  St using oxygen "for smoking".

## 2019-12-25 ENCOUNTER — Observation Stay: Payer: Medicare Other | Admitting: Anesthesiology

## 2019-12-25 ENCOUNTER — Encounter
Admission: EM | Disposition: A | Discharge status: Home Health | Payer: Self-pay | Source: Ambulatory Visit | Attending: Hospitalist

## 2019-12-25 DIAGNOSIS — D509 Iron deficiency anemia, unspecified: Secondary | ICD-10-CM | POA: Diagnosis present

## 2019-12-25 DIAGNOSIS — D508 Other iron deficiency anemias: Secondary | ICD-10-CM | POA: Diagnosis not present

## 2019-12-25 DIAGNOSIS — F17290 Nicotine dependence, other tobacco product, uncomplicated: Secondary | ICD-10-CM | POA: Diagnosis present

## 2019-12-25 DIAGNOSIS — Z20822 Contact with and (suspected) exposure to covid-19: Secondary | ICD-10-CM | POA: Diagnosis present

## 2019-12-25 DIAGNOSIS — J690 Pneumonitis due to inhalation of food and vomit: Secondary | ICD-10-CM | POA: Diagnosis not present

## 2019-12-25 DIAGNOSIS — Z794 Long term (current) use of insulin: Secondary | ICD-10-CM | POA: Diagnosis not present

## 2019-12-25 DIAGNOSIS — E1122 Type 2 diabetes mellitus with diabetic chronic kidney disease: Secondary | ICD-10-CM | POA: Diagnosis present

## 2019-12-25 DIAGNOSIS — E119 Type 2 diabetes mellitus without complications: Secondary | ICD-10-CM | POA: Diagnosis not present

## 2019-12-25 DIAGNOSIS — J9621 Acute and chronic respiratory failure with hypoxia: Secondary | ICD-10-CM | POA: Diagnosis not present

## 2019-12-25 DIAGNOSIS — Z9981 Dependence on supplemental oxygen: Secondary | ICD-10-CM | POA: Diagnosis not present

## 2019-12-25 DIAGNOSIS — E876 Hypokalemia: Secondary | ICD-10-CM | POA: Diagnosis present

## 2019-12-25 DIAGNOSIS — E440 Moderate protein-calorie malnutrition: Secondary | ICD-10-CM | POA: Diagnosis present

## 2019-12-25 DIAGNOSIS — F1010 Alcohol abuse, uncomplicated: Secondary | ICD-10-CM | POA: Diagnosis present

## 2019-12-25 DIAGNOSIS — J9601 Acute respiratory failure with hypoxia: Secondary | ICD-10-CM | POA: Diagnosis not present

## 2019-12-25 DIAGNOSIS — E11649 Type 2 diabetes mellitus with hypoglycemia without coma: Secondary | ICD-10-CM | POA: Diagnosis not present

## 2019-12-25 DIAGNOSIS — D631 Anemia in chronic kidney disease: Secondary | ICD-10-CM | POA: Diagnosis present

## 2019-12-25 DIAGNOSIS — Z89422 Acquired absence of other left toe(s): Secondary | ICD-10-CM | POA: Diagnosis not present

## 2019-12-25 DIAGNOSIS — N2581 Secondary hyperparathyroidism of renal origin: Secondary | ICD-10-CM | POA: Diagnosis present

## 2019-12-25 DIAGNOSIS — Z79899 Other long term (current) drug therapy: Secondary | ICD-10-CM | POA: Diagnosis not present

## 2019-12-25 DIAGNOSIS — K921 Melena: Secondary | ICD-10-CM | POA: Diagnosis not present

## 2019-12-25 DIAGNOSIS — K219 Gastro-esophageal reflux disease without esophagitis: Secondary | ICD-10-CM | POA: Diagnosis present

## 2019-12-25 DIAGNOSIS — E08641 Diabetes mellitus due to underlying condition with hypoglycemia with coma: Secondary | ICD-10-CM | POA: Diagnosis not present

## 2019-12-25 DIAGNOSIS — Z7982 Long term (current) use of aspirin: Secondary | ICD-10-CM | POA: Diagnosis not present

## 2019-12-25 DIAGNOSIS — R531 Weakness: Secondary | ICD-10-CM | POA: Diagnosis present

## 2019-12-25 DIAGNOSIS — Z8673 Personal history of transient ischemic attack (TIA), and cerebral infarction without residual deficits: Secondary | ICD-10-CM | POA: Diagnosis not present

## 2019-12-25 DIAGNOSIS — I120 Hypertensive chronic kidney disease with stage 5 chronic kidney disease or end stage renal disease: Secondary | ICD-10-CM | POA: Diagnosis present

## 2019-12-25 DIAGNOSIS — R197 Diarrhea, unspecified: Secondary | ICD-10-CM | POA: Diagnosis not present

## 2019-12-25 DIAGNOSIS — D649 Anemia, unspecified: Secondary | ICD-10-CM | POA: Diagnosis not present

## 2019-12-25 DIAGNOSIS — N186 End stage renal disease: Secondary | ICD-10-CM | POA: Diagnosis present

## 2019-12-25 DIAGNOSIS — E785 Hyperlipidemia, unspecified: Secondary | ICD-10-CM | POA: Diagnosis present

## 2019-12-25 DIAGNOSIS — Z992 Dependence on renal dialysis: Secondary | ICD-10-CM | POA: Diagnosis not present

## 2019-12-25 DIAGNOSIS — E1165 Type 2 diabetes mellitus with hyperglycemia: Secondary | ICD-10-CM | POA: Diagnosis present

## 2019-12-25 HISTORY — PX: ESOPHAGOGASTRODUODENOSCOPY (EGD) WITH PROPOFOL: SHX5813

## 2019-12-25 LAB — GASTROINTESTINAL PANEL BY PCR, STOOL (REPLACES STOOL CULTURE)

## 2019-12-25 LAB — HEMOGLOBIN AND HEMATOCRIT, BLOOD
HCT: 28.8 % — ABNORMAL LOW (ref 39.0–52.0)
Hemoglobin: 8.9 g/dL — ABNORMAL LOW (ref 13.0–17.0)

## 2019-12-25 LAB — GLUCOSE, CAPILLARY
Glucose-Capillary: 102 mg/dL — ABNORMAL HIGH (ref 70–99)
Glucose-Capillary: 26 mg/dL — CL (ref 70–99)
Glucose-Capillary: 94 mg/dL (ref 70–99)
Glucose-Capillary: 136 mg/dL — ABNORMAL HIGH (ref 70–99)
Glucose-Capillary: 44 mg/dL — CL (ref 70–99)
Glucose-Capillary: 238 mg/dL — ABNORMAL HIGH (ref 70–99)

## 2019-12-25 LAB — POCT I-STAT, CHEM 8
BUN: 26 mg/dL — ABNORMAL HIGH (ref 6–20)
Calcium, Ion: 1 mmol/L — ABNORMAL LOW (ref 1.15–1.40)
Chloride: 101 mmol/L (ref 98–111)
Creatinine, Ser: 3.5 mg/dL — ABNORMAL HIGH (ref 0.61–1.24)
Glucose, Bld: 77 mg/dL (ref 70–99)
HCT: 25 % — ABNORMAL LOW (ref 39.0–52.0)
Hemoglobin: 8.5 g/dL — ABNORMAL LOW (ref 13.0–17.0)
Potassium: 3.5 mmol/L (ref 3.5–5.1)
Sodium: 142 mmol/L (ref 135–145)
TCO2: 29 mmol/L (ref 22–32)

## 2019-12-25 LAB — TYPE AND SCREEN
ABO/RH(D): A POS
Antibody Screen: NEGATIVE
Unit division: 0

## 2019-12-25 LAB — C DIFFICILE QUICK SCREEN W PCR REFLEX
C Diff antigen: NEGATIVE
C Diff interpretation: NOT DETECTED
C Diff toxin: NEGATIVE

## 2019-12-25 LAB — BPAM RBC
Blood Product Expiration Date: 202109092359
ISSUE DATE / TIME: 202108122153
Unit Type and Rh: 6200

## 2019-12-25 LAB — SARS CORONAVIRUS 2 BY RT PCR (HOSPITAL ORDER, PERFORMED IN ~~LOC~~ HOSPITAL LAB): SARS Coronavirus 2: NEGATIVE

## 2019-12-25 LAB — VITAMIN B12: Vitamin B-12: 1163 pg/mL — ABNORMAL HIGH (ref 180–914)

## 2019-12-25 SURGERY — ESOPHAGOGASTRODUODENOSCOPY (EGD) WITH PROPOFOL
Anesthesia: General

## 2019-12-25 MED ORDER — SERTRALINE HCL 50 MG PO TABS
100.0000 mg | ORAL_TABLET | Freq: Every day | ORAL | Status: DC
  Administered 2019-12-27 – 2020-01-04 (×9): 100 mg via ORAL
  Filled 2019-12-25 (×9): qty 2

## 2019-12-25 MED ORDER — ASPIRIN EC 81 MG PO TBEC
81.0000 mg | DELAYED_RELEASE_TABLET | Freq: Every day | ORAL | Status: DC
  Administered 2019-12-27 – 2020-01-04 (×9): 81 mg via ORAL
  Filled 2019-12-25 (×9): qty 1

## 2019-12-25 MED ORDER — CHLORHEXIDINE GLUCONATE CLOTH 2 % EX PADS
6.0000 | MEDICATED_PAD | Freq: Every day | CUTANEOUS | Status: DC
  Administered 2019-12-27 – 2020-01-02 (×5): 6 via TOPICAL

## 2019-12-25 MED ORDER — MELATONIN 5 MG PO TABS
2.5000 mg | ORAL_TABLET | Freq: Every day | ORAL | Status: DC
  Administered 2019-12-25 – 2020-01-03 (×10): 2.5 mg via ORAL
  Filled 2019-12-25 (×10): qty 1

## 2019-12-25 MED ORDER — DEXTROSE 50 % IV SOLN
INTRAVENOUS | Status: AC
  Administered 2019-12-25: 50 mL via INTRAVENOUS
  Filled 2019-12-25: qty 50

## 2019-12-25 MED ORDER — PROPOFOL 500 MG/50ML IV EMUL
INTRAVENOUS | Status: AC
  Filled 2019-12-25: qty 50

## 2019-12-25 MED ORDER — GLYCOPYRROLATE 0.2 MG/ML IJ SOLN
INTRAMUSCULAR | Status: DC | PRN
  Administered 2019-12-25: .2 mg via INTRAVENOUS

## 2019-12-25 MED ORDER — DEXTROSE 50 % IV SOLN
INTRAVENOUS | Status: AC
  Filled 2019-12-25: qty 50

## 2019-12-25 MED ORDER — HYDRALAZINE HCL 25 MG PO TABS
25.0000 mg | ORAL_TABLET | Freq: Two times a day (BID) | ORAL | Status: DC
  Administered 2019-12-25 – 2020-01-04 (×17): 25 mg via ORAL
  Filled 2019-12-25 (×18): qty 1

## 2019-12-25 MED ORDER — PROPOFOL 10 MG/ML IV BOLUS
INTRAVENOUS | Status: DC | PRN
  Administered 2019-12-25: 30 mg via INTRAVENOUS

## 2019-12-25 MED ORDER — ALBUTEROL SULFATE HFA 108 (90 BASE) MCG/ACT IN AERS
INHALATION_SPRAY | RESPIRATORY_TRACT | Status: AC
  Filled 2019-12-25: qty 6.7

## 2019-12-25 MED ORDER — DEXTROSE 50 % IV SOLN: 1.0000 | Freq: Once | INTRAVENOUS | Status: AC

## 2019-12-25 MED ORDER — THIAMINE HCL 100 MG PO TABS
100.0000 mg | ORAL_TABLET | Freq: Every day | ORAL | Status: DC
  Administered 2019-12-27 – 2020-01-04 (×9): 100 mg via ORAL
  Filled 2019-12-25 (×11): qty 1

## 2019-12-25 MED ORDER — LIDOCAINE HCL (PF) 2 % IJ SOLN
INTRAMUSCULAR | Status: AC
  Filled 2019-12-25: qty 5

## 2019-12-25 MED ORDER — NICOTINE 7 MG/24HR TD PT24
7.0000 mg | MEDICATED_PATCH | Freq: Every day | TRANSDERMAL | Status: DC
  Administered 2019-12-28 – 2020-01-04 (×8): 7 mg via TRANSDERMAL
  Filled 2019-12-25 (×10): qty 1

## 2019-12-25 MED ORDER — SODIUM CHLORIDE 0.9 % IV SOLN: INTRAVENOUS | Status: DC

## 2019-12-25 MED ORDER — CARVEDILOL 6.25 MG PO TABS
6.2500 mg | ORAL_TABLET | Freq: Two times a day (BID) | ORAL | Status: DC
  Administered 2019-12-25 – 2020-01-04 (×15): 6.25 mg via ORAL
  Filled 2019-12-25 (×16): qty 1

## 2019-12-25 MED ORDER — PROPOFOL 10 MG/ML IV BOLUS
INTRAVENOUS | Status: AC
  Filled 2019-12-25: qty 20

## 2019-12-25 MED ORDER — INSULIN GLARGINE 100 UNIT/ML ~~LOC~~ SOLN
6.0000 [IU] | Freq: Every day | SUBCUTANEOUS | Status: DC
  Filled 2019-12-25 (×3): qty 0.06

## 2019-12-25 MED ORDER — FOLIC ACID 1 MG PO TABS
1.0000 mg | ORAL_TABLET | Freq: Every day | ORAL | Status: DC
  Administered 2019-12-27 – 2020-01-04 (×9): 1 mg via ORAL
  Filled 2019-12-25 (×9): qty 1

## 2019-12-25 MED ORDER — RENA-VITE PO TABS
1.0000 | ORAL_TABLET | Freq: Every day | ORAL | Status: DC
  Administered 2019-12-27 – 2020-01-04 (×9): 1 via ORAL
  Filled 2019-12-25 (×9): qty 1

## 2019-12-25 MED ORDER — DEXTROSE 50 % IV SOLN
1.0000 | Freq: Once | INTRAVENOUS | Status: AC
  Administered 2019-12-25: 50 mL via INTRAVENOUS

## 2019-12-25 MED ORDER — PROPOFOL 500 MG/50ML IV EMUL
INTRAVENOUS | Status: DC | PRN
  Administered 2019-12-25: 150 ug/kg/min via INTRAVENOUS

## 2019-12-25 NOTE — Transfer of Care (Signed)
Immediate Anesthesia Transfer of Care Note  [name]  Procedure(s) Performed: ESOPHAGOGASTRODUODENOSCOPY (EGD) WITH PROPOFOL (N/A )  Patient Location: PACU  Anesthesia Type:General  Level of Consciousness: awake and oriented  Airway & Oxygen Therapy: Patient Spontanous Breathing and Patient connected to nasal cannula oxygen  Post-op Assessment: Report given to RN and Post -op Vital signs reviewed and stable  Post vital signs: Reviewed and stable  Last Vitals:  Vitals Value Taken Time  BP 130/78 12/25/19 1452  Temp 36.4 C 12/25/19 1452  Pulse 101 12/25/19 1454  Resp 29 12/25/19 1454  SpO2 89 % 12/25/19 1454  Vitals shown include unvalidated device data.  Last Pain:  Vitals:   12/25/19 1452  TempSrc: Temporal  PainSc: Asleep         Complications: No complications documented.

## 2019-12-25 NOTE — Op Note (Signed)
[facility] Gastroenterology Patient Name: [name] Procedure Date: 12/25/2019 2:00 PM MRN: [id number] Account #: [id number] Date of Birth: 01/18/1961 Admit Type: Outpatient Age: 59 Room: [facility] ENDO ROOM 1 Gender: Male Note Status: Finalized Procedure:             Upper GI endoscopy Indications:           Melena Providers:             Lin Landsman MD, MD Medicines:             Monitored Anesthesia Care Complications:         No immediate complications. Estimated blood loss: None. Procedure:             Pre-Anesthesia Assessment:                        - Prior to the procedure, a History and Physical was                         performed, and patient medications and allergies were                         reviewed. The patient is competent. The risks and                         benefits of the procedure and the sedation options and                         risks were discussed with the patient. All questions                         were answered and informed consent was obtained.                         Patient identification and proposed procedure were                         verified by the physician, the nurse, the                         anesthesiologist, the anesthetist and the technician                         in the pre-procedure area in the procedure room in the                         endoscopy suite. Mental Status Examination: alert and                         oriented. Airway Examination: normal oropharyngeal                         airway and neck mobility. Respiratory Examination:                         clear to auscultation. CV Examination: normal.                         Prophylactic Antibiotics: The patient does not require  prophylactic antibiotics. Prior Anticoagulants: The                         patient has taken no previous anticoagulant or                         antiplatelet agents. ASA Grade Assessment: III - A                          patient with severe systemic disease. After reviewing                         the risks and benefits, the patient was deemed in                         satisfactory condition to undergo the procedure. The                         anesthesia plan was to use monitored anesthesia care                         (MAC). Immediately prior to administration of                         medications, the patient was re-assessed for adequacy                         to receive sedatives. The heart rate, respiratory                         rate, oxygen saturations, blood pressure, adequacy of                         pulmonary ventilation, and response to care were                         monitored throughout the procedure. The physical                         status of the patient was re-assessed after the                         procedure.                        After obtaining informed consent, the endoscope was                         passed under direct vision. Throughout the procedure,                         the patient's blood pressure, pulse, and oxygen                         saturations were monitored continuously. The Endoscope                         was introduced through the mouth, and advanced to the  second part of duodenum. The upper GI endoscopy was                         accomplished without difficulty. The patient tolerated                         the procedure well. Findings:      The esophagus was normal.      The stomach was normal.      The examined duodenum was normal. Impression:            - Normal esophagus.                        - Normal stomach.                        - Normal examined duodenum.                        - No specimens collected. Recommendation:        - Return patient to hospital ward for possible                         discharge same day.                        - Resume previous diet today. Procedure  Code(s):     --- Professional ---                        [phone], Esophagogastroduodenoscopy, flexible,                         transoral; diagnostic, including collection of                         specimen(s) by brushing or washing, when performed                         (separate procedure) Diagnosis Code(s):     --- Professional ---                        K92.1, Melena (includes Hematochezia) CPT copyright 2019 American Medical Association. All rights reserved. The codes documented in this report are preliminary and upon coder review may  be revised to meet current compliance requirements. Dr. Ulyess Mort Lin Landsman MD, MD 12/25/2019 2:30:36 PM This report has been signed electronically. Number of Addenda: 0 Note Initiated On: 12/25/2019 2:00 PM Estimated Blood Loss:  Estimated blood loss: none.      [facility]

## 2019-12-25 NOTE — Progress Notes (Signed)
[facility]  ROUNDING NOTE   Subjective:   [name] was admitted to [facility] on 12/24/2019 for Cough [R05] Weakness [R53.1] Anemia [D64.9] Symptomatic anemia [D64.9] Diarrhea, unspecified type [R19.7]  Patient was found to have a hemoglobin of 6.6 and was asked to be evaluated in the ED. He again was found to be anemia and with heme positive stools.   Wife is on the phone.   Objective:  Vital signs in last 24 hours:  Temp:  [97.3 F (36.3 C)-98.7 F (37.1 C)] 98.7 F (37.1 C) (08/13 0147) Pulse Rate:  [80-93] 85 (08/12 2315) Resp:  [17-20] 20 (08/12 2228) BP: (117-140)/(67-84) 127/83 (08/13 0147) SpO2:  [89 %-100 %] 100 % (08/13 0147) Weight:  [61.2 kg] 61.2 kg (08/12 1208)  Weight change:  Filed Weights   12/24/19 1208  Weight: 61.2 kg    Intake/Output: I/O last 3 completed shifts: In: 1136 [P.O.:720; Blood:416] Out: -    Intake/Output this shift:  Total I/O In: 360 [P.O.:360] Out: -   Physical Exam: General: NAD,   Head: Normocephalic, atraumatic. Moist oral mucosal membranes  Eyes: Anicteric, PERRL  Neck: Supple, trachea midline  Lungs:  Clear to auscultation  Heart: Regular rate and rhythm  Abdomen:  Soft, nontender,   Extremities: no peripheral edema.  Neurologic: Nonfocal, moving all four extremities  Skin: No lesions  Access: RIJ permcath    Basic Metabolic Panel: Recent Labs  Lab 12/24/19 1229  NA 140  K 3.4*  CL 97*  CO2 32  GLUCOSE 277*  BUN 20  CREATININE 2.54*  CALCIUM 7.9*    Liver Function Tests: No results for input(s): AST, ALT, ALKPHOS, BILITOT, PROT, ALBUMIN in the last 168 hours. No results for input(s): LIPASE, AMYLASE in the last 168 hours. No results for input(s): AMMONIA in the last 168 hours.  CBC: Recent Labs  Lab 12/24/19 1229 12/25/19 0216  WBC 4.9  --   HGB 6.6* 8.9*  HCT 20.8* 28.8*  MCV 95.9  --   PLT 257  --     Cardiac Enzymes: No results for input(s): CKTOTAL, CKMB, CKMBINDEX,  TROPONINI in the last 168 hours.  BNP: Invalid input(s): POCBNP  CBG: Recent Labs  Lab 12/24/19 1228 12/24/19 1553 12/24/19 2159 12/25/19 0736 12/25/19 0804  GLUCAP 257* 302* 256* 26* 102*    Microbiology: Results for orders placed or performed during the hospital encounter of 12/24/19  Gastrointestinal Panel by PCR , Stool     Status: None   Collection Time: 12/24/19  5:12 PM   Specimen: Stool  Result Value Ref Range Status   Campylobacter species NOT DETECTED NOT DETECTED Final   Plesimonas shigelloides NOT DETECTED NOT DETECTED Final   Salmonella species NOT DETECTED NOT DETECTED Final   Yersinia enterocolitica NOT DETECTED NOT DETECTED Final   Vibrio species NOT DETECTED NOT DETECTED Final   Vibrio cholerae NOT DETECTED NOT DETECTED Final   Enteroaggregative E coli (EAEC) NOT DETECTED NOT DETECTED Final   Enteropathogenic E coli (EPEC) NOT DETECTED NOT DETECTED Final   Enterotoxigenic E coli (ETEC) NOT DETECTED NOT DETECTED Final   Shiga like toxin producing E coli (STEC) NOT DETECTED NOT DETECTED Final   Shigella/Enteroinvasive E coli (EIEC) NOT DETECTED NOT DETECTED Final   Cryptosporidium NOT DETECTED NOT DETECTED Final   Cyclospora cayetanensis NOT DETECTED NOT DETECTED Final   Entamoeba histolytica NOT DETECTED NOT DETECTED Final   Giardia lamblia NOT DETECTED NOT DETECTED Final   Adenovirus F40/41 NOT DETECTED  NOT DETECTED Final   Astrovirus NOT DETECTED NOT DETECTED Final   Norovirus GI/GII NOT DETECTED NOT DETECTED Final   Rotavirus A NOT DETECTED NOT DETECTED Final   Sapovirus (I, II, IV, and V) NOT DETECTED NOT DETECTED Final    Comment: Performed at [facility], [address], [place], [place] [address]  C Difficile Quick Screen w PCR reflex     Status: None   Collection Time: 12/24/19  5:12 PM   Specimen: STOOL  Result Value Ref Range Status   C Diff antigen NEGATIVE NEGATIVE Final   C Diff toxin NEGATIVE NEGATIVE Final   C Diff  interpretation No C. difficile detected.  Final    Comment: Performed at [facility], [address] [place], [place] [address]    Coagulation Studies: No results for input(s): LABPROT, INR in the last 72 hours.  Urinalysis: No results for input(s): COLORURINE, LABSPEC, PHURINE, GLUCOSEU, HGBUR, BILIRUBINUR, KETONESUR, PROTEINUR, UROBILINOGEN, NITRITE, LEUKOCYTESUR in the last 72 hours.  Invalid input(s): APPERANCEUR    Imaging: DG Chest 2 View  Result Date: 12/24/2019 CLINICAL DATA:  Weakness, fatigue, shortness of breath EXAM: CHEST - 2 VIEW COMPARISON:  12/06/2019 FINDINGS: Right dialysis catheter remains in place, unchanged. Cardiomegaly. Diffuse interstitial opacities throughout the lungs with small effusions. Findings compatible with edema/CHF. No acute bony abnormality. IMPRESSION: Mild-to-moderate interstitial edema.  Small bilateral effusions. Electronically Signed   By: Rolm Baptise M.D.   On: 12/24/2019 18:16     Medications:    . aspirin EC  81 mg Oral Daily  . carvedilol  6.25 mg Oral BID WC  . Chlorhexidine Gluconate Cloth  6 each Topical Daily  . folic acid  1 mg Oral Daily  . hydrALAZINE  25 mg Oral BID  . insulin aspart  0-5 Units Subcutaneous QHS  . insulin aspart  0-9 Units Subcutaneous TID WC  . insulin glargine  6 Units Subcutaneous QHS  . melatonin  2.5 mg Oral QHS  . multivitamin  1 tablet Oral Daily  . pantoprazole (PROTONIX) IV  40 mg Intravenous Q12H  . sertraline  100 mg Oral Daily  . thiamine  100 mg Oral Daily   acetaminophen **OR** acetaminophen, ondansetron **OR** ondansetron (ZOFRAN) IV  Assessment/ Plan:  [name] is a 59 y.o. black male with end stage renal disease on hemodialysis, diabetes mellitus type II, hypertension, CVA, hyperlipidemia, history of EtOH who is admitted to [facility] on 12/24/2019 for Cough [R05] Weakness [R53.1] Anemia [D64.9] Symptomatic anemia [D64.9] Diarrhea, unspecified type [R19.7]  [facility] TTS  [place]. RIJ permcath 61.5kg  1. End Stage Renal Disease: last hemodialysis treatment yesterday. Treatment scheduled for tomorrow. TTS schedule.  2. Hypertension: home regimen of carvedilol, hydralazine and Entresto. Currently on carvedilol only.   3. Anemia of chronic kidney disease and iron deficiency. Hemoccult positive stools - Appreciate GI input.   4. Secondary Hyperparathyroidism with hyperphosphatemia and hypocalcemia: outpatient labs on 8/10: PTH 490, phos 8.4, calcium of 7.8. Currently not on phos binders.    LOS: 0 Diahann Guajardo 8/13/202110:50 AM

## 2019-12-25 NOTE — Progress Notes (Signed)
[place] [facility] [place] NAME: [name]    MR#:  [id number]  DATE OF BIRTH:  Mar 07, 1961  SUBJECTIVE:  patient just got back from EGD. Feeling hungry. He has ordered food.  REVIEW OF SYSTEMS:   Review of Systems  Constitutional: Positive for malaise/fatigue. Negative for chills, fever and weight loss.  HENT: Negative for ear discharge, ear pain and nosebleeds.   Eyes: Negative for blurred vision, pain and discharge.  Respiratory: Negative for sputum production, shortness of breath, wheezing and stridor.   Cardiovascular: Negative for chest pain, palpitations, orthopnea and PND.  Gastrointestinal: Negative for abdominal pain, diarrhea, nausea and vomiting.  Genitourinary: Negative for frequency and urgency.  Musculoskeletal: Negative for back pain and joint pain.  Neurological: Positive for weakness. Negative for sensory change, speech change and focal weakness.  Psychiatric/Behavioral: Negative for depression and hallucinations. The patient is not nervous/anxious.    Tolerating Diet:yes Tolerating PT:   DRUG ALLERGIES:   Allergies  Allergen Reactions  . Ferrous Gluconate Nausea And Vomiting  . Other     VITALS:  Blood pressure (!) 151/92, pulse (!) 106, temperature 97.8 F (36.6 C), resp. rate (!) 29, height 5\' 11"  (1.803 m), weight 62.5 kg, SpO2 92 %.  PHYSICAL EXAMINATION:   Physical Exam  GENERAL:  59 y.o.-year-old patient lying in the bed with no acute distress.  EYES: Pupils equal, round, reactive to light and accommodation. No scleral icterus.  Pallor+ HEENT: Head atraumatic, normocephalic. Oropharynx and nasopharynx clear.  LUNGS: Normal breath sounds bilaterally, no wheezing, rales, rhonchi. No use of accessory muscles of respiration.  CARDIOVASCULAR: S1, S2 normal. No murmurs, rubs, or gallops.  ABDOMEN: Soft, nontender, nondistended. Bowel sounds present. No organomegaly or mass.  EXTREMITIES: No cyanosis, clubbing or  edema b/l.    NEUROLOGIC: Cranial nerves II through XII are intact. No focal Motor or sensory deficits b/l.   PSYCHIATRIC:  patient is alert and oriented x 2 SKIN: No obvious rash, lesion, or ulcer.   LABORATORY PANEL:  CBC Recent Labs  Lab 12/24/19 1229 12/25/19 0216 12/25/19 1320  WBC 4.9  --   --   HGB 6.6*   < > 8.5*  HCT 20.8*   < > 25.0*  PLT 257  --   --    < > = values in this interval not displayed.    Chemistries  Recent Labs  Lab 12/24/19 1229 12/24/19 1229 12/25/19 1320  NA 140   < > 142  K 3.4*   < > 3.5  CL 97*   < > 101  CO2 32  --   --   GLUCOSE 277*   < > 77  BUN 20   < > 26*  CREATININE 2.54*   < > 3.50*  CALCIUM 7.9*  --   --    < > = values in this interval not displayed.   Cardiac Enzymes No results for input(s): TROPONINI in the last 168 hours. RADIOLOGY:  DG Chest 2 View  Result Date: 12/24/2019 CLINICAL DATA:  Weakness, fatigue, shortness of breath EXAM: CHEST - 2 VIEW COMPARISON:  12/06/2019 FINDINGS: Right dialysis catheter remains in place, unchanged. Cardiomegaly. Diffuse interstitial opacities throughout the lungs with small effusions. Findings compatible with edema/CHF. No acute bony abnormality. IMPRESSION: Mild-to-moderate interstitial edema.  Small bilateral effusions. Electronically Signed   By: Rolm Baptise M.D.   On: 12/24/2019 18:16   ASSESSMENT AND PLAN:   [name] is a 59  y.o. male past medical history significant for diabetes, stroke, hypertension, history of alcohol use, [name], Thursday and Saturday, [name]  Hemoglobin was at 6.  Patient reports a history of shortness of breath on exertion for the last 3 to 4 months.  He also reported history of episode of  black stool a week prior to admission.    1-Severe anemia, Symptomatic:  Presented with a hemoglobin of 6,  reports shortness of breath on exertion. Concern for GI bleed.  Report of melena -hgb 6.6--2 unit of packed red blood cell--8.9--8.6 --IV Protonix twice daily-- change to oral --GI Dr. Kathyrn Sheriff performed EGD which has been normal. -- Patient has had colonoscopy in 2020 had biopsies of multiple polyps which were benign.   2-hypoglycemia -noted to her blood sugar of 26 this morning -hold insulin -sliding scale insulin for now -depending on sugar adjust home meds -continue to monitor sugars for one more night  3-Diarrhea: GI pathogen and C diff -- negative  4-ESRD; on HD Tues, Thu, Sat; Had HD yesterday Nephrology consultation with Dr. Juleen China appreciated  5-Mild hypokalemia.  -repeated    DVT Prophylaxis: SCDs, no anticoagulation due to low hemoglobin and concern for GI bleed Code Status: Full code Family Communication: Care discussed with patient Disposition Plan:  will observe overnight for patient sugars. Overall he is improving.  :  Status is: Inpatient  Remains inpatient appropriate because:Persistent severe electrolyte disturbances   Dispo: The patient is from: Home              Anticipated d/c is to: Home              Anticipated d/c date is: 1 day              Patient currently is not medically stable to d/c.  TOTAL TIME TAKING CARE OF THIS PATIENT: *25* minutes.  >50% time spent on counselling and coordination of care  Note: This dictation was prepared with Dragon dictation along with smaller phrase technology. Any transcriptional errors that result from this process are unintentional.  Fritzi Mandes M.D    [facility]   CC: Primary care physician; [name], MDPatient ID: [name], male   DOB: 1960/08/07, 59 y.o.   MRN: [id number]

## 2019-12-25 NOTE — Progress Notes (Signed)
CBG rechecked at 15 minutes. 102 after 50 ml dextrose.

## 2019-12-25 NOTE — Anesthesia Preprocedure Evaluation (Signed)
Anesthesia Evaluation  Patient identified by MRN, date of birth, ID band Patient awake    Reviewed: Allergy & Precautions, NPO status , Patient's Chart, lab work & pertinent test results  History of Anesthesia Complications Negative for: history of anesthetic complications  Airway Mallampati: II  TM Distance: >3 FB Neck ROM: Full    Dental  (+) Poor Dentition   Pulmonary COPD,  oxygen dependent, Current Smoker and Patient abstained from smoking.,    breath sounds clear to auscultation- rhonchi (-) wheezing      Cardiovascular hypertension, Pt. on medications (-) CAD, (-) Past MI, (-) Cardiac Stents and (-) CABG  Rhythm:Regular Rate:Normal - Systolic murmurs and - Diastolic murmurs    Neuro/Psych PSYCHIATRIC DISORDERS Depression CVA    GI/Hepatic Neg liver ROS, GERD  ,  Endo/Other  diabetes, Insulin Dependent  Renal/GU ESRF and DialysisRenal disease     Musculoskeletal negative musculoskeletal ROS (+)   Abdominal (+) - obese,   Peds  Hematology  (+) anemia ,   Anesthesia Other Findings Past Medical History: No date: Chronic kidney disease No date: Diabetes mellitus without complication (HCC) No date: ETOH abuse No date: GERD (gastroesophageal reflux disease) No date: Hyperlipidemia No date: Hypertension No date: Stroke [facility])   Reproductive/Obstetrics                             Anesthesia Physical Anesthesia Plan  ASA: IV  Anesthesia Plan: General   Post-op Pain Management:    Induction: Intravenous  PONV Risk Score and Plan: 0 and Propofol infusion  Airway Management Planned: Natural Airway  Additional Equipment:   Intra-op Plan:   Post-operative Plan:   Informed Consent: I have reviewed the patients History and Physical, chart, labs and discussed the procedure including the risks, benefits and alternatives for the proposed anesthesia with the patient or authorized  representative who has indicated his/her understanding and acceptance.     Dental advisory given  Plan Discussed with: CRNA and Anesthesiologist  Anesthesia Plan Comments:         Anesthesia Quick Evaluation

## 2019-12-25 NOTE — Progress Notes (Signed)
Glucose 44, 1 amp dextrose given. He is A&O x 4. Asymptomatic.

## 2019-12-25 NOTE — Progress Notes (Addendum)
Inpatient Diabetes Program Recommendations  AACE/ADA: New Consensus Statement on Inpatient Glycemic Control (2015)  Target Ranges:  Prepandial:   less than 140 mg/dL      Peak postprandial:   less than 180 mg/dL (1-2 hours)      Critically ill patients:  140 - 180 mg/dL   Results for CHARLS, CUSTER (MRN [id number]) as of 12/25/2019 08:06  Ref. Range 12/24/2019 12:28 12/24/2019 15:53 12/24/2019 21:59 12/25/2019 07:36  Glucose-Capillary Latest Ref Range: 70 - 99 mg/dL 257 (H) 302 (H) 256 (H)  3 units NOVOLOG +  10 units LANTUS 26 (LL)    Admit with: Severe anemia, Symptomatic (Hemoglobin 6.6)  History: DM, CVA, ETOH Abuse, ESRD  Home DM Meds: Lantus 20 units Daily (per ENDO notes 10 units QHS)       Humalog 0-15 units TID per SSI (per ENDO notes, 2-6 units BID with Lunch and Dinner only)  Current Orders: Novolog Sensitive Correction Scale/ SSI (0-9 units) TID AC + HS     Lantus 10 units Daily    MD- Note patient received 10 units Lantus last PM around 8pm.  Had Severe Hypoglycemia this AM (CBG 26)  Please consider reducing Lantus to 6 units and change timing to bedtime (10 pm).   Endocrinologist: Dr, Gabriel Carina with [facility] seen 11/18/2019--Was told to take the following: Lantus 10 units Daily Humalog 2-6 units BID with Lunch and Dinner only   NOTE: In reviewing chart, noted patient inpatient 07/06/19-07/08/19 with [place], 08/24/19-08/25/19 with hypoglycemia, and 09/16/19-09/17/19 with [place] (left AMA on 09/17/19). Diabetes coordinator spoke with patient last on 09/17/19 during prior admission. Patient has wide fluctuations in glucose control and noted to have HHNK as well as significant hypoglycemia over the past 5 months.     --Will follow patient during hospitalization--  Wyn Quaker RN, MSN, CDE Diabetes Coordinator Inpatient Glycemic Control Team Team Pager: [phone] (8a-5p)

## 2019-12-25 NOTE — Progress Notes (Signed)
CBG 26. 25g dextrose given. CN notified.

## 2019-12-25 NOTE — Consult Note (Signed)
Reginald Darby, MD [address]  [place]  [place], [place] [address]  Main: [phone]  Fax: [phone] Pager: [phone]   Consultation  Referring Provider:     No ref. provider found Primary Care Physician:  [name], MD Primary Gastroenterologist:  Dr. Lucilla Lame         Reason for Consultation:     Melena  Date of Admission:  12/24/2019 Date of Consultation:  12/25/2019         HPI:   [name] is a 59 y.o. male with history of end-stage renal disease on hemodialysis, metabolic syndrome who is admitted from dialysis center secondary to low hemoglobin needing blood transfusion.  His hemoglobin was 6.6.  He does report exertional dyspnea for last 3 to 4 months.  He does report black stool a week ago.  He denies abdominal pain, nausea or vomiting.  When I went to see the patient, he was irritable and asking if he can eat right away.  Patient received blood transfusion overnight and responded appropriately. Studies are consistent with anemia of chronic disease.  NSAIDs: None  Antiplts/Anticoagulants/Anti thrombotics: None  GI Procedures:  Colonoscopy 03/06/2019  DIAGNOSIS:  A. COLON POLYP, DESCENDING; COLD SNARE:  - FECAL MATERIAL.  - NO COLONIC MUCOSA IDENTIFIED.   B. COLON POLYP, ASCENDING; COLD BIOPSY:  - TUBULAR ADENOMA.  - NEGATIVE FOR HIGH-GRADE DYSPLASIA AND MALIGNANCY.   C. COLON POLYPS X3, ASCENDING; COLD SNARE (X1) AND COLD BIOPSIES (X2):  - FRAGMENTS OF SESSILE SERRATED POLYPS WITH DYSPLASIA.  - FRAGMENTS OF TUBULAR ADENOMAS; NEGATIVE FOR HIGH GRADE DYSPLASIA.  - NEGATIVE FOR MALIGNANCY.   D. COLON, RANDOM; COLD BIOPSY:  - BENIGN COLONIC TISSUE WITH NO SIGNIFICANT HISTOPATHOLOGIC CHANGE.  - NEGATIVE FOR FEATURES OF MICROSCOPIC COLITIS.  - NEGATIVE FOR DYSPLASIA AND MALIGNANCY.   E. COLON POLYP, TRANSVERSE; COLD SNARE:  - FRAGMENTS OF SESSILE SERRATED POLYP WITH DYSPLASIA.  - FRAGMENTS OF TUBULAR ADENOMA; NEGATIVE FOR HIGH  GRADE DYSPLASIA.  - NEGATIVE FOR MALIGNANCY.    Upper endoscopy 07/01/2016  Normal hypopharynx. - Monilial esophagitis. - Gastritis. - Normal duodenal bulb, second portion of the duodenum and third portion of the duodenum. - No specimens collected.  Past Medical History:  Diagnosis Date  . Chronic kidney disease   . Diabetes mellitus without complication ([place])   . ETOH abuse   . GERD (gastroesophageal reflux disease)   . Hyperlipidemia   . Hypertension   . Stroke [facility])     Past Surgical History:  Procedure Laterality Date  . AMPUTATION TOE Left 12/11/2019   Procedure: AMPUTATION LEFT 2nd TOE;  Surgeon: Sharlotte Alamo, DPM;  Location: [facility] ORS;  Service: Podiatry;  Laterality: Left;  . AV FISTULA PLACEMENT Right 11/25/2019   Procedure: INSERTION OF ARTERIOVENOUS (AV) GORE-TEX GRAFT ARM;  Surgeon: Katha Cabal, MD;  Location: [facility] ORS;  Service: Vascular;  Laterality: Right;  . COLONOSCOPY WITH PROPOFOL N/A 03/06/2019   Procedure: COLONOSCOPY WITH PROPOFOL;  Surgeon: Lucilla Lame, MD;  Location[facility] ENDOSCOPY;  Service: Endoscopy;  Laterality: N/A;  . DIALYSIS/PERMA CATHETER INSERTION N/A 04/10/2019   Procedure: DIALYSIS/PERMA CATHETER INSERTION;  Surgeon: Serafina Mitchell, MD;  Location: [place] CV LAB;  Service: Cardiovascular;  Laterality: N/A;  . ESOPHAGOGASTRODUODENOSCOPY (EGD) WITH PROPOFOL N/A 07/01/2016   Procedure: ESOPHAGOGASTRODUODENOSCOPY (EGD) WITH PROPOFOL;  Surgeon: San Jetty, MD;  Location: [facility] ENDOSCOPY;  Service: General;  Laterality: N/A;  . LOWER EXTREMITY ANGIOGRAPHY Left 12/10/2019   Procedure: Lower Extremity  Angiography;  Surgeon: Algernon Huxley, MD;  Location: [place] CV LAB;  Service: Cardiovascular;  Laterality: Left;    Prior to Admission medications   Medication Sig Start Date End Date Taking? Authorizing Provider  Amino Acid Infusion (PROSOL) 20 % SOLN Inject into the vein. 11/28/19  Yes [provider]  aspirin EC 81 MG EC  tablet Take 1 tablet (81 mg total) by mouth daily. 06/10/19  Yes Jennye Boroughs, MD  carvedilol (COREG) 6.25 MG tablet Take 6.25 mg by mouth 2 (two) times daily with a meal.  10/28/19  Yes [provider]  ENTRESTO 24-26 MG Take 1 tablet by mouth 2 (two) times daily. 07/29/19  Yes [provider]  folic acid (FOLVITE) 1 MG tablet Take 1 mg by mouth daily. 03/12/19  Yes [provider]  hydrALAZINE (APRESOLINE) 25 MG tablet Take 1 tablet (25 mg total) by mouth every 8 (eight) hours. Patient taking differently: Take 25 mg by mouth in the morning and at bedtime.  07/08/19  Yes Ezekiel Slocumb, DO  HYDROcodone-acetaminophen (NORCO/VICODIN) 5-325 MG tablet Take 1 tablet by mouth every 4 (four) hours as needed for moderate pain. 12/13/19  Yes Georgette Shell, MD  insulin glargine (LANTUS) 100 UNIT/ML injection Inject 10 Units into the skin daily.    Yes [provider]  insulin lispro (HUMALOG KWIKPEN) 100 UNIT/ML KwikPen Inject 0-0.09 mLs (0-9 Units total) into the skin 3 (three) times daily with meals. According to sliding scale Patient taking differently: Inject 0-15 Units into the skin 3 (three) times daily with meals. According to sliding scale 07/08/19  Yes Ezekiel Slocumb, DO  Melatonin 3 MG TABS Take 1 tablet by mouth at bedtime. 05/23/19  Yes [provider]  multivitamin (RENA-VIT) TABS tablet Take 1 tablet by mouth daily.   Yes [provider]  Nutritional Supplements (FEEDING SUPPLEMENT, NEPRO CARB STEADY,) LIQD Take 237 mLs by mouth 3 (three) times daily between meals. 07/08/19  Yes Nicole Kindred A, DO  sertraline (ZOLOFT) 100 MG tablet Take 100 mg by mouth daily.  06/17/19  Yes [provider]  thiamine (VITAMIN B-1) 100 MG tablet Take 100 mg by mouth daily.   Yes [provider]    Current Facility-Administered Medications:  .  0.9 %  sodium chloride infusion, , Intravenous, Continuous, Naveah Brave, Tally Due, MD .  Doug Sou  Hold] acetaminophen (TYLENOL) tablet 650 mg, 650 mg, Oral, Q6H PRN **OR** [MAR Hold] acetaminophen (TYLENOL) suppository 650 mg, 650 mg, Rectal, Q6H PRN, Regalado, Belkys A, MD .  Doug Sou Hold] aspirin EC tablet 81 mg, 81 mg, Oral, Daily, Fritzi Mandes, MD .  Doug Sou Hold] carvedilol (COREG) tablet 6.25 mg, 6.25 mg, Oral, BID WC, Fritzi Mandes, MD .  Doug Sou Hold] Chlorhexidine Gluconate Cloth 2 % PADS 6 each, 6 each, Topical, Daily, Regalado, Belkys A, MD .  Doug Sou Hold] folic acid (FOLVITE) tablet 1 mg, 1 mg, Oral, Daily, Fritzi Mandes, MD .  Doug Sou Hold] hydrALAZINE (APRESOLINE) tablet 25 mg, 25 mg, Oral, BID, Fritzi Mandes, MD .  Doug Sou Hold] insulin aspart (novoLOG) injection 0-5 Units, 0-5 Units, Subcutaneous, QHS, Regalado, Belkys A, MD, 3 Units at 12/24/19 2234 .  [MAR Hold] insulin aspart (novoLOG) injection 0-9 Units, 0-9 Units, Subcutaneous, TID WC, Regalado, Belkys A, MD .  Doug Sou Hold] insulin glargine (LANTUS) injection 6 Units, 6 Units, Subcutaneous, QHS, Fritzi Mandes, MD .  Doug Sou Hold] melatonin tablet 2.5 mg, 2.5 mg, Oral, QHS, Fritzi Mandes, MD .  Doug Sou Hold]  multivitamin (RENA-VIT) tablet 1 tablet, 1 tablet, Oral, Daily, Fritzi Mandes, MD .  Doug Sou Hold] ondansetron [facility]) tablet 4 mg, 4 mg, Oral, Q6H PRN **OR** [MAR Hold] ondansetron (ZOFRAN) injection 4 mg, 4 mg, Intravenous, Q6H PRN, Regalado, Belkys A, MD .  Doug Sou Hold] pantoprazole (PROTONIX) injection 40 mg, 40 mg, Intravenous, Q12H, Regalado, Belkys A, MD, 40 mg at 12/24/19 2154 .  [MAR Hold] sertraline (ZOLOFT) tablet 100 mg, 100 mg, Oral, Daily, Fritzi Mandes, MD .  Doug Sou Hold] thiamine tablet 100 mg, 100 mg, Oral, Daily, Fritzi Mandes, MD  Family History  Problem Relation Age of Onset  . CAD Brother   . Dementia Mother   . Renal Disease Father      Social History   Tobacco Use  . Smoking status: Current Every Day Smoker    Packs/day: 0.25    Years: 5.00    Pack years: 1.25    Types: Cigars  . Smokeless tobacco: Never Used  Vaping Use  .  Vaping Use: Never used  Substance Use Topics  . Alcohol use: Yes    Alcohol/week: 16.0 standard drinks    Types: 14 Cans of beer, 2 Shots of liquor per week  . Drug use: No    Allergies as of 12/24/2019 - Review Complete 12/24/2019  Allergen Reaction Noted  . Ferrous gluconate Nausea And Vomiting 02/17/2014  . Other  11/19/2019    Review of Systems:    All systems reviewed and negative except where noted in HPI.   Physical Exam:  Vital signs in last 24 hours: Temp:  [97.3 F (36.3 C)-98.7 F (37.1 C)] 97.5 F (36.4 C) (08/13 1328) Pulse Rate:  [80-94] 94 (08/13 1328) Resp:  [16-20] 16 (08/13 1328) BP: (117-146)/(67-90) 146/90 (08/13 1328) SpO2:  [90 %-100 %] 96 % (08/13 1328) Weight:  [62.5 kg] 62.5 kg (08/13 1328) Last BM Date: 12/24/19 General:   Pleasant, cooperative in NAD Head:  Normocephalic and atraumatic. Eyes:   No icterus.   Conjunctiva pink. PERRLA. Ears:  Normal auditory acuity. Neck:  Supple; no masses or thyroidomegaly Lungs: Respirations even and unlabored. Lungs clear to auscultation bilaterally.   No wheezes, crackles, or rhonchi.  Heart:  Regular rate and rhythm;  Without murmur, clicks, rubs or gallops Abdomen:  Soft, nondistended, nontender. Normal bowel sounds. No appreciable masses or hepatomegaly.  No rebound or guarding.  Rectal:  Not performed. Msk:  Symmetrical without gross deformities.  Strength generalized weakness Extremities:  Without edema, cyanosis or clubbing. Neurologic:  Alert and oriented x3;  grossly normal neurologically. Skin:  Intact without significant lesions or rashes. Psych:  Alert and cooperative. Normal affect.  LAB RESULTS: CBC Latest Ref Rng & Units 12/25/2019 12/25/2019 12/24/2019  WBC 4.0 - 10.5 K/uL - - 4.9  Hemoglobin 13.0 - 17.0 g/dL 8.5(L) 8.9(L) 6.6(L)  Hematocrit 39 - 52 % 25.0(L) 28.8(L) 20.8(L)  Platelets 150 - 400 K/uL - - 257    BMET BMP Latest Ref Rng & Units 12/25/2019 12/24/2019 12/12/2019  Glucose 70 - 99  mg/dL 77 277(H) 496(H)  BUN 6 - 20 mg/dL 26(H) 20 -  Creatinine 0.61 - 1.24 mg/dL 3.50(H) 2.54(H) -  Sodium 135 - 145 mmol/L 142 140 -  Potassium 3.5 - 5.1 mmol/L 3.5 3.4(L) -  Chloride 98 - 111 mmol/L 101 97(L) -  CO2 22 - 32 mmol/L - 32 -  Calcium 8.9 - 10.3 mg/dL - 7.9(L) -    LFT Hepatic Function Latest Ref Rng & Units 12/06/2019 07/08/2019  07/07/2019  Total Protein 6.5 - 8.1 g/dL 7.4 5.4(L) 5.5(L)  Albumin 3.5 - 5.0 g/dL 3.4(L) 2.2(L) 2.2(L)  AST 15 - 41 U/L 43(H) 34 27  ALT 0 - 44 U/L 38 18 17  Alk Phosphatase 38 - 126 U/L 294(H) 206(H) 200(H)  Total Bilirubin 0.3 - 1.2 mg/dL 0.7 1.0 0.8     STUDIES: DG Chest 2 View  Result Date: 12/24/2019 CLINICAL DATA:  Weakness, fatigue, shortness of breath EXAM: CHEST - 2 VIEW COMPARISON:  12/06/2019 FINDINGS: Right dialysis catheter remains in place, unchanged. Cardiomegaly. Diffuse interstitial opacities throughout the lungs with small effusions. Findings compatible with edema/CHF. No acute bony abnormality. IMPRESSION: Mild-to-moderate interstitial edema.  Small bilateral effusions. Electronically Signed   By: Rolm Baptise M.D.   On: 12/24/2019 18:16      Impression / Plan:   [name] is a 59 y.o. male with end-stage renal disease on hemodialysis, diabetes, hypertension, hyperlipidemia, anemia of chronic disease, history of melena about a week ago, admitted from outpatient dialysis center secondary to low hemoglobin which responded appropriately to blood transfusion  Given history of melena, with history of ESRD, recommend upper endoscopy to evaluate for any peptic ulcer disease, small bowel AVMs or erosive esophagitis If this is unremarkable, patient can go home today and follow-up with GI as outpatient Continue PPI as outpatient  Thank you for involving me in the care of this patient.      LOS: 0 days   Sherri Sear, MD  12/25/2019, 2:17 PM   Note: This dictation was prepared with Dragon dictation along with smaller  phrase technology. Any transcriptional errors that result from this process are unintentional.

## 2019-12-26 DIAGNOSIS — N186 End stage renal disease: Secondary | ICD-10-CM

## 2019-12-26 DIAGNOSIS — E119 Type 2 diabetes mellitus without complications: Secondary | ICD-10-CM

## 2019-12-26 DIAGNOSIS — D508 Other iron deficiency anemias: Secondary | ICD-10-CM

## 2019-12-26 DIAGNOSIS — Z794 Long term (current) use of insulin: Secondary | ICD-10-CM

## 2019-12-26 LAB — CBC
HCT: 24.9 % — ABNORMAL LOW (ref 39.0–52.0)
Hemoglobin: 8 g/dL — ABNORMAL LOW (ref 13.0–17.0)
MCH: 30.5 pg (ref 26.0–34.0)
MCHC: 32.1 g/dL (ref 30.0–36.0)
MCV: 95 fL (ref 80.0–100.0)
Platelets: 272 10*3/uL (ref 150–400)
RBC: 2.62 MIL/uL — ABNORMAL LOW (ref 4.22–5.81)
RDW: 17.4 % — ABNORMAL HIGH (ref 11.5–15.5)
WBC: 5.7 10*3/uL (ref 4.0–10.5)
nRBC: 0 % (ref 0.0–0.2)

## 2019-12-26 LAB — RENAL FUNCTION PANEL
Albumin: 2.5 g/dL — ABNORMAL LOW (ref 3.5–5.0)
Anion gap: 13 (ref 5–15)
BUN: 34 mg/dL — ABNORMAL HIGH (ref 6–20)
CO2: 26 mmol/L (ref 22–32)
Calcium: 8 mg/dL — ABNORMAL LOW (ref 8.9–10.3)
Chloride: 98 mmol/L (ref 98–111)
Creatinine, Ser: 4.56 mg/dL — ABNORMAL HIGH (ref 0.61–1.24)
GFR calc Af Amer: 15 mL/min — ABNORMAL LOW (ref >60)
GFR calc non Af Amer: 13 mL/min — ABNORMAL LOW (ref >60)
Glucose, Bld: 492 mg/dL — ABNORMAL HIGH (ref 70–99)
Phosphorus: 6.6 mg/dL — ABNORMAL HIGH (ref 2.5–4.6)
Potassium: 4.1 mmol/L (ref 3.5–5.1)
Sodium: 137 mmol/L (ref 135–145)

## 2019-12-26 LAB — GLUCOSE, CAPILLARY
Glucose-Capillary: 431 mg/dL — ABNORMAL HIGH (ref 70–99)
Glucose-Capillary: 442 mg/dL — ABNORMAL HIGH (ref 70–99)
Glucose-Capillary: 449 mg/dL — ABNORMAL HIGH (ref 70–99)
Glucose-Capillary: 226 mg/dL — ABNORMAL HIGH (ref 70–99)
Glucose-Capillary: 376 mg/dL — ABNORMAL HIGH (ref 70–99)

## 2019-12-26 LAB — HEPATIC FUNCTION PANEL
ALT: 30 U/L (ref 0–44)
AST: 43 U/L — ABNORMAL HIGH (ref 15–41)
Albumin: 2.5 g/dL — ABNORMAL LOW (ref 3.5–5.0)
Alkaline Phosphatase: 178 U/L — ABNORMAL HIGH (ref 38–126)
Bilirubin, Direct: <0.1 mg/dL (ref 0.0–0.2)
Total Bilirubin: 0.7 mg/dL (ref 0.3–1.2)
Total Protein: 6.1 g/dL — ABNORMAL LOW (ref 6.5–8.1)

## 2019-12-26 LAB — LACTATE DEHYDROGENASE: LDH: 166 U/L (ref 98–192)

## 2019-12-26 LAB — HEMOGLOBIN: Hemoglobin: 7.7 g/dL — ABNORMAL LOW (ref 13.0–17.0)

## 2019-12-26 MED ORDER — EPOETIN ALFA 10000 UNIT/ML IJ SOLN
10000.0000 [IU] | INTRAMUSCULAR | Status: DC
  Administered 2019-12-26: 10000 [IU] via INTRAVENOUS

## 2019-12-26 MED ORDER — DIPHENHYDRAMINE HCL 12.5 MG/5ML PO ELIX
12.5000 mg | ORAL_SOLUTION | Freq: Three times a day (TID) | ORAL | Status: DC | PRN
  Administered 2019-12-26 – 2020-01-03 (×8): 12.5 mg via ORAL
  Filled 2019-12-26 (×11): qty 5

## 2019-12-26 MED ORDER — SODIUM CHLORIDE 0.9 % IV SOLN
200.0000 mg | Freq: Once | INTRAVENOUS | Status: AC
  Administered 2019-12-26: 200 mg via INTRAVENOUS
  Filled 2019-12-26: qty 10

## 2019-12-26 MED ORDER — GLUCERNA SHAKE PO LIQD
237.0000 mL | Freq: Two times a day (BID) | ORAL | Status: DC
  Administered 2019-12-27 – 2020-01-04 (×10): 237 mL via ORAL

## 2019-12-26 MED ORDER — INSULIN GLARGINE 100 UNIT/ML ~~LOC~~ SOLN
6.0000 [IU] | Freq: Every day | SUBCUTANEOUS | Status: DC
  Administered 2019-12-26 – 2019-12-30 (×5): 6 [IU] via SUBCUTANEOUS
  Filled 2019-12-26 (×6): qty 0.06

## 2019-12-26 NOTE — Progress Notes (Signed)
[name]  MRN: [id number]  DOB/AGE: January 10, 1961 59 y.o.  Primary Care Physician:Hande, Cherlyn Labella, MD  Admit date: 12/24/2019  Chief Complaint:  Chief Complaint  Patient presents with  . Weakness  . Shortness of Breath    S-Pt presented on  12/24/2019 with  Chief Complaint  Patient presents with  . Weakness  . Shortness of Breath  .    Pt today feels better.  Patient seen on dialysis.  Patient main complaint in today visit was when can I go home Patient offers no other specific complaints  Medications . aspirin EC  81 mg Oral Daily  . carvedilol  6.25 mg Oral BID WC  . Chlorhexidine Gluconate Cloth  6 each Topical Daily  . epoetin (EPOGEN/PROCRIT) injection  10,000 Units Intravenous Q T,Th,Sa-HD  . folic acid  1 mg Oral Daily  . hydrALAZINE  25 mg Oral BID  . insulin aspart  0-5 Units Subcutaneous QHS  . insulin aspart  0-9 Units Subcutaneous TID WC  . insulin glargine  6 Units Subcutaneous Daily  . melatonin  2.5 mg Oral QHS  . multivitamin  1 tablet Oral Daily  . nicotine  7 mg Transdermal Daily  . pantoprazole (PROTONIX) IV  40 mg Intravenous Q12H  . sertraline  100 mg Oral Daily  . thiamine  100 mg Oral Daily         TLX:BWIOM from the symptoms mentioned above,there are no other symptoms referable to all systems reviewed.  Physical Exam: Vital signs in last 24 hours: Temp:  [97.5 F (36.4 C)-98.2 F (36.8 C)] 98.2 F (36.8 C) (08/14 0415) Pulse Rate:  [86-106] 99 (08/14 0415) Resp:  [16-29] 19 (08/14 0415) BP: (126-151)/(75-92) 144/79 (08/14 0415) SpO2:  [86 %-99 %] 90 % (08/14 0600) Weight:  [62.5 kg] 62.5 kg (08/13 1328) Weight change: 1.264 kg Last BM Date: 12/25/19  Intake/Output from previous day: 08/13 0701 - 08/14 0700 In: 435 [P.O.:360; I.V.:75] Out: 1 [Stool:1] No intake/output data recorded.   Physical Exam: General- pt is awake,alert, oriented to time place and person Resp- No acute REsp distress, CTA B/L NO Rhonchi CVS- S1S2  regular in rate and rhythm GIT- BS+, soft, NT, ND EXT- NO LE Edema, Cyanosis Access patient has permacath in situ  Lab Results: CBC Recent Labs    12/24/19 1229 12/24/19 1229 12/25/19 0216 12/25/19 1320  WBC 4.9  --   --   --   HGB 6.6*   < > 8.9* 8.5*  HCT 20.8*   < > 28.8* 25.0*  PLT 257  --   --   --    < > = values in this interval not displayed.    BMET Recent Labs    12/24/19 1229 12/25/19 1320  NA 140 142  K 3.4* 3.5  CL 97* 101  CO2 32  --   GLUCOSE 277* 77  BUN 20 26*  CREATININE 2.54* 3.50*  CALCIUM 7.9*  --     MICRO Recent Results (from the past 240 hour(s))  Gastrointestinal Panel by PCR , Stool     Status: None   Collection Time: 12/24/19  5:12 PM   Specimen: Stool  Result Value Ref Range Status   Campylobacter species NOT DETECTED NOT DETECTED Final   Plesimonas shigelloides NOT DETECTED NOT DETECTED Final   Salmonella species NOT DETECTED NOT DETECTED Final   Yersinia enterocolitica NOT DETECTED NOT DETECTED Final   Vibrio species NOT DETECTED NOT DETECTED Final   Vibrio cholerae NOT  DETECTED NOT DETECTED Final   Enteroaggregative E coli (EAEC) NOT DETECTED NOT DETECTED Final   Enteropathogenic E coli (EPEC) NOT DETECTED NOT DETECTED Final   Enterotoxigenic E coli (ETEC) NOT DETECTED NOT DETECTED Final   Shiga like toxin producing E coli (STEC) NOT DETECTED NOT DETECTED Final   Shigella/Enteroinvasive E coli (EIEC) NOT DETECTED NOT DETECTED Final   Cryptosporidium NOT DETECTED NOT DETECTED Final   Cyclospora cayetanensis NOT DETECTED NOT DETECTED Final   Entamoeba histolytica NOT DETECTED NOT DETECTED Final   Giardia lamblia NOT DETECTED NOT DETECTED Final   Adenovirus F40/41 NOT DETECTED NOT DETECTED Final   Astrovirus NOT DETECTED NOT DETECTED Final   Norovirus GI/GII NOT DETECTED NOT DETECTED Final   Rotavirus A NOT DETECTED NOT DETECTED Final   Sapovirus (I, II, IV, and V) NOT DETECTED NOT DETECTED Final    Comment: Performed at  [facility], [address], [place], [place] [address]  C Difficile Quick Screen w PCR reflex     Status: None   Collection Time: 12/24/19  5:12 PM   Specimen: STOOL  Result Value Ref Range Status   C Diff antigen NEGATIVE NEGATIVE Final   C Diff toxin NEGATIVE NEGATIVE Final   C Diff interpretation No C. difficile detected.  Final    Comment: Performed at [facility], [address] [place], [place] [address]  SARS Coronavirus 2 by RT PCR (hospital order, performed in [facility] hospital lab) Nasopharyngeal Nasopharyngeal Swab     Status: None   Collection Time: 12/24/19  5:12 PM   Specimen: Nasopharyngeal Swab  Result Value Ref Range Status   SARS Coronavirus 2 NEGATIVE NEGATIVE Final    Comment: (NOTE) SARS-CoV-2 target nucleic acids are NOT DETECTED.  The SARS-CoV-2 RNA is generally detectable in upper and lower respiratory specimens during the acute phase of infection. The lowest concentration of SARS-CoV-2 viral copies this assay can detect is 250 copies / mL. A negative result does not preclude SARS-CoV-2 infection and should not be used as the sole basis for treatment or other patient management decisions.  A negative result may occur with improper specimen collection / handling, submission of specimen other than nasopharyngeal swab, presence of viral mutation(s) within the areas targeted by this assay, and inadequate number of viral copies (<250 copies / mL). A negative result must be combined with clinical observations, patient history, and epidemiological information.  Fact Sheet for Patients:   StrictlyIdeas.no  Fact Sheet for Healthcare Providers: BankingDealers.co.za  This test is not yet approved or  cleared by the [place] FDA and has been authorized for detection and/or diagnosis of SARS-CoV-2 by FDA under an Emergency Use Authorization (EUA).  This EUA will remain in effect (meaning  this test can be used) for the duration of the COVID-19 declaration under Section 564(b)(1) of the Act, 21 U.S.C. section 360bbb-3(b)(1), unless the authorization is terminated or revoked sooner.  Performed at [facility], [address] [place], [place] [address]       Lab Results  Component Value Date   CALCIUM 7.9 (L) 12/24/2019   CAION 1.00 (L) 12/25/2019   PHOS 3.4 08/25/2019               Impression:  Patient is a 59 year old African-American male with a past medical history of end-stage renal disease on hemodialysis, diabetes mellitus type 2, hypertension, history of CVA, hyperlipidemia, history of alcohol abuse who was admitted on December 24, 2019 with chief complaint of Cough  Weakness Anemia Diarrhea  1)Renal end-stage renal disease Patient has history of ESRD, patient is on Tuesday Thursday Saturday schedule. Patient undergoes dialysis at [facility].  Patient is under [place] care.   2)HTN Blood pressure is at goal  3)Anemia of chronic disease with a component of GI bleed Patient did receive PRBC during the admission. Patient did under go EGD as well as colonoscopy  Hemoglobin was not at goal We will keep patient on Epogen during dialysis    4) secondary hyperparathyroidism -CKD Mineral-Bone Disorder  Patient has secondary hyperparathyroidism with hyperphosphatemia and hypocalcemia Patient outpatient lab was reviewed the patient had PTH of 490 pg/mL phosphorus of 8.4 with a calcium of 7.8  We will follow up on phosphorus and start binders outpatient   5) diarrhea Patient being closely followed by primary team as well as GI  6) electrolytes   sodium Normonatremic   potassium Hypokalemia Now better    7)Acid base Co2 at goal     Plan:   Patient will be dialyzed today  Addendum Patient seen on dialysis Patient tolerating treatment well  Edessa Jakubowicz s [facility] 12/26/2019, 9:04 AM

## 2019-12-26 NOTE — Progress Notes (Signed)
PROGRESS NOTE    [name]  LKJ:[id number] DOB: October 25, 1960 DOA: 12/24/2019 PCP: [name], MD   Follow-up on severe anemia Brief Narrative: (Start on day 1 of progress note - keep it brief and live) [name] a 59 y.o.malepast medical history significant for diabetes, stroke, hypertension, history of alcohol use, [name],Thursday and Saturday,[name] Hemoglobin was at 6. Patient reports a history of shortness of breath on exertion for the last 3 to 4 months. He also reported history of episode ofblack stool a week prior to admission.  8/14.  Hemoglobin continued drop to 7.7.  We will keep patient for 1 more day.  Assessment & Plan:   Active Problems:   Iron deficiency anemia   Uncontrolled diabetes mellitus (HCC)   CVA (cerebral vascular accident) ([place])   Type 2 diabetes mellitus without complication, with long-term current use of insulin (HCC)   ESRD (end stage renal disease) (HCC)   Anemia  #1.  Severe symptomatic anemia. EGD did not show any active bleeding.  Colonoscopy in 2020 and showed benign polyps.  Patient has been seen by GI. Hemoglobin dropped down to 7.7 today, I will keep him for 1 more day until he is more stable hemoglobin.  He had a normal B12 level at admission, iron level slightly low.  I will give a dose IV iron.  2.  Uncontrolled type 2 diabetes with hypoglycemia alternating with hyperglycemia. Glucose running high again.  Lantus started, continue sliding scale insulin.  3.  End-stage renal disease. Continue hemodialysis.  4.  Hypokalemia. Per nephrology.  5.  Moderate protein calorie malnutrition. Protein supplement   DVT prophylaxis: SCDs Code Status: Full Family Communication: None Disposition Plan:  . Patient came from:            . Anticipated d/c  place: . Barriers to d/c OR conditions which need to be met to effect a safe d/c:   Consultants:   Nephrology  Procedures: HD  Antimicrobials:None  Subjective: Patient doing better today.  No additional hypoglycemia.  No nausea vomiting. Not short of breath or cough. No confusion.  Objective: Vitals:   12/26/19 1005 12/26/19 1015 12/26/19 1030 12/26/19 1045  BP: (!) 143/88 (!) 141/89 (!) 143/86 (!) 152/84  Pulse: 87 89 86 81  Resp: 18 11 12  (!) 22  Temp: 98.4 F (36.9 C)     TempSrc: Oral     SpO2: 98%     Weight:      Height:        Intake/Output Summary (Last 24 hours) at 12/26/2019 1413 Last data filed at 12/26/2019 1345 Gross per 24 hour  Intake 315 ml  Output 2000 ml  Net -1685 ml   Filed Weights   12/24/19 1208 12/25/19 1328  Weight: 61.2 kg 62.5 kg    Examination:  General exam: Appears calm and comfortable  Respiratory system: Clear to auscultation. Respiratory effort normal. Cardiovascular system: Irregular. No JVD, murmurs, rubs, gallops or clicks. No pedal edema. Gastrointestinal system: Abdomen is nondistended, soft and nontender. No organomegaly or masses felt. Normal bowel sounds heard. Central nervous system: Alert and oriented. No focal neurological deficits. Extremities: Symmetric  Skin: No rashes, lesions or ulcers Psychiatry: Mood & affect appropriate.     Data Reviewed: I have personally reviewed following labs and imaging studies  CBC: Recent Labs  Lab 12/24/19 1229  12/25/19 0216 12/25/19 1320 12/26/19 0938 12/26/19 1345  WBC 4.9  --   --  5.7  --   HGB 6.6* 8.9* 8.5* 8.0* 7.7*  HCT 20.8* 28.8* 25.0* 24.9*  --   MCV 95.9  --   --  95.0  --   PLT 257  --   --  272  --    Basic Metabolic Panel: Recent Labs  Lab 12/24/19 1229 12/25/19 1320 12/26/19 0938  NA 140 142 137  K 3.4* 3.5 4.1  CL 97* 101 98  CO2 32  --  26  GLUCOSE 277* 77 492*  BUN 20 26* 34*  CREATININE 2.54* 3.50* 4.56*  CALCIUM 7.9*  --  8.0*  PHOS  --    --  6.6*   GFR: Estimated Creatinine Clearance: 15.2 mL/min (A) (by C-G formula based on SCr of 4.56 mg/dL (H)). Liver Function Tests: Recent Labs  Lab 12/26/19 0938  AST 43*  ALT 30  ALKPHOS 178*  BILITOT 0.7  PROT 6.1*  ALBUMIN 2.5*  2.5*   No results for input(s): LIPASE, AMYLASE in the last 168 hours. No results for input(s): AMMONIA in the last 168 hours. Coagulation Profile: No results for input(s): INR, PROTIME in the last 168 hours. Cardiac Enzymes: No results for input(s): CKTOTAL, CKMB, CKMBINDEX, TROPONINI in the last 168 hours. BNP (last 3 results) No results for input(s): PROBNP in the last 8760 hours. HbA1C: No results for input(s): HGBA1C in the last 72 hours. CBG: Recent Labs  Lab 12/25/19 1724 12/25/19 2102 12/26/19 0750 12/26/19 0816 12/26/19 0902  GLUCAP 136* 238* 431* 442* 449*   Lipid Profile: No results for input(s): CHOL, HDL, LDLCALC, TRIG, CHOLHDL, LDLDIRECT in the last 72 hours. Thyroid Function Tests: No results for input(s): TSH, T4TOTAL, FREET4, T3FREE, THYROIDAB in the last 72 hours. Anemia Panel: Recent Labs    12/24/19 1712  VITAMINB12 1,163*  FOLATE 80.0  FERRITIN 136  TIBC 269  IRON 42*  RETICCTPCT 5.8*   Sepsis Labs: No results for input(s): PROCALCITON, LATICACIDVEN in the last 168 hours.  Recent Results (from the past 240 hour(s))  Gastrointestinal Panel by PCR , Stool     Status: None   Collection Time: 12/24/19  5:12 PM   Specimen: Stool  Result Value Ref Range Status   Campylobacter species NOT DETECTED NOT DETECTED Final   Plesimonas shigelloides NOT DETECTED NOT DETECTED Final   Salmonella species NOT DETECTED NOT DETECTED Final   Yersinia enterocolitica NOT DETECTED NOT DETECTED Final   Vibrio species NOT DETECTED NOT DETECTED Final   Vibrio cholerae NOT DETECTED NOT DETECTED Final   Enteroaggregative E coli (EAEC) NOT DETECTED NOT DETECTED Final   Enteropathogenic E coli (EPEC) NOT DETECTED NOT DETECTED  Final   Enterotoxigenic E coli (ETEC) NOT DETECTED NOT DETECTED Final   Shiga like toxin producing E coli (STEC) NOT DETECTED NOT DETECTED Final   Shigella/Enteroinvasive E coli (EIEC) NOT DETECTED NOT DETECTED Final   Cryptosporidium NOT DETECTED NOT DETECTED Final   Cyclospora cayetanensis NOT DETECTED NOT DETECTED Final   Entamoeba histolytica NOT DETECTED NOT DETECTED Final   Giardia lamblia NOT DETECTED NOT DETECTED Final   Adenovirus F40/41 NOT DETECTED NOT DETECTED Final   Astrovirus NOT DETECTED NOT DETECTED Final   Norovirus GI/GII NOT DETECTED NOT DETECTED Final   Rotavirus A NOT DETECTED NOT DETECTED Final   Sapovirus (I, II, IV, and V) NOT DETECTED NOT DETECTED Final    Comment: Performed at [facility],  [place], [place] [address]  C Difficile Quick Screen w PCR reflex     Status: None   Collection Time: 12/24/19  5:12 PM   Specimen: STOOL  Result Value Ref Range Status   C Diff antigen NEGATIVE NEGATIVE Final   C Diff toxin NEGATIVE NEGATIVE Final   C Diff interpretation No C. difficile detected.  Final    Comment: Performed at [facility], [address] [place], [place] [address]  SARS Coronavirus 2 by RT PCR (hospital order, performed in [facility] hospital lab) Nasopharyngeal Nasopharyngeal Swab     Status: None   Collection Time: 12/24/19  5:12 PM   Specimen: Nasopharyngeal Swab  Result Value Ref Range Status   SARS Coronavirus 2 NEGATIVE NEGATIVE Final    Comment: (NOTE) SARS-CoV-2 target nucleic acids are NOT DETECTED.  The SARS-CoV-2 RNA is generally detectable in upper and lower respiratory specimens during the acute phase of infection. The lowest concentration of SARS-CoV-2 viral copies this assay can detect is 250 copies / mL. A negative result does not preclude SARS-CoV-2 infection and should not be used as the sole basis for treatment or other patient management decisions.  A negative result may occur with improper  specimen collection / handling, submission of specimen other than nasopharyngeal swab, presence of viral mutation(s) within the areas targeted by this assay, and inadequate number of viral copies (<250 copies / mL). A negative result must be combined with clinical observations, patient history, and epidemiological information.  Fact Sheet for Patients:   StrictlyIdeas.no  Fact Sheet for Healthcare Providers: BankingDealers.co.za  This test is not yet approved or  cleared by the [place] FDA and has been authorized for detection and/or diagnosis of SARS-CoV-2 by FDA under an Emergency Use Authorization (EUA).  This EUA will remain in effect (meaning this test can be used) for the duration of the COVID-19 declaration under Section 564(b)(1) of the Act, 21 U.S.C. section 360bbb-3(b)(1), unless the authorization is terminated or revoked sooner.  Performed at [facility], [address], [place], [place] [address]          Radiology Studies: DG Chest 2 View  Result Date: 12/24/2019 CLINICAL DATA:  Weakness, fatigue, shortness of breath EXAM: CHEST - 2 VIEW COMPARISON:  12/06/2019 FINDINGS: Right dialysis catheter remains in place, unchanged. Cardiomegaly. Diffuse interstitial opacities throughout the lungs with small effusions. Findings compatible with edema/CHF. No acute bony abnormality. IMPRESSION: Mild-to-moderate interstitial edema.  Small bilateral effusions. Electronically Signed   By: Rolm Baptise M.D.   On: 12/24/2019 18:16        Scheduled Meds: . aspirin EC  81 mg Oral Daily  . carvedilol  6.25 mg Oral BID WC  . Chlorhexidine Gluconate Cloth  6 each Topical Daily  . epoetin (EPOGEN/PROCRIT) injection  10,000 Units Intravenous Q T,Th,Sa-HD  . folic acid  1 mg Oral Daily  . hydrALAZINE  25 mg Oral BID  . insulin aspart  0-5 Units Subcutaneous QHS  . insulin aspart  0-9 Units Subcutaneous TID WC  .  insulin glargine  6 Units Subcutaneous Daily  . melatonin  2.5 mg Oral QHS  . multivitamin  1 tablet Oral Daily  . nicotine  7 mg Transdermal Daily  . pantoprazole (PROTONIX) IV  40 mg Intravenous Q12H  . sertraline  100 mg Oral Daily  . thiamine  100 mg Oral Daily   Continuous Infusions:   LOS: 1 day    Time spent: 28 minutes  Sharen Hones, MD [facility]   To contact the attending provider between 7A-7P or the covering provider during after hours 7P-7A, please log into the web site [email or web address] and access using universal [place] password for that web site. If you do not have the password, please call the hospital operator.  12/26/2019, 2:13 PM

## 2019-12-26 NOTE — Progress Notes (Signed)
CBG this morning 493 per NT, stat recheck 442. MD notified, will give lantus and 9 units novolog per MD order.

## 2019-12-26 NOTE — Progress Notes (Signed)
Paged Hospitalist re: patient experiencing increase in SOB. Increased O2 to 4L and repositioned in bed. Patient stated the repositioning made it better. O2 SAT at 90%. Supposed to go for Dialysis today. Continuing to monitor.

## 2019-12-27 ENCOUNTER — Inpatient Hospital Stay: Payer: Medicare Other

## 2019-12-27 DIAGNOSIS — J9601 Acute respiratory failure with hypoxia: Secondary | ICD-10-CM

## 2019-12-27 DIAGNOSIS — E08641 Diabetes mellitus due to underlying condition with hypoglycemia with coma: Secondary | ICD-10-CM

## 2019-12-27 LAB — CBC WITH DIFFERENTIAL/PLATELET
Abs Immature Granulocytes: 0.01 10*3/uL (ref 0.00–0.07)
Basophils Absolute: 0 10*3/uL (ref 0.0–0.1)
Basophils Relative: 0 %
Eosinophils Absolute: 0.1 10*3/uL (ref 0.0–0.5)
Eosinophils Relative: 3 %
HCT: 24.2 % — ABNORMAL LOW (ref 39.0–52.0)
Hemoglobin: 7.9 g/dL — ABNORMAL LOW (ref 13.0–17.0)
Immature Granulocytes: 0 %
Lymphocytes Relative: 28 %
Lymphs Abs: 1.3 10*3/uL (ref 0.7–4.0)
MCH: 30.4 pg (ref 26.0–34.0)
MCHC: 32.6 g/dL (ref 30.0–36.0)
MCV: 93.1 fL (ref 80.0–100.0)
Monocytes Absolute: 0.4 10*3/uL (ref 0.1–1.0)
Monocytes Relative: 9 %
Neutro Abs: 2.8 10*3/uL (ref 1.7–7.7)
Neutrophils Relative %: 60 %
Platelets: 244 10*3/uL (ref 150–400)
RBC: 2.6 MIL/uL — ABNORMAL LOW (ref 4.22–5.81)
RDW: 16.8 % — ABNORMAL HIGH (ref 11.5–15.5)
WBC: 4.7 10*3/uL (ref 4.0–10.5)
nRBC: 0 % (ref 0.0–0.2)

## 2019-12-27 LAB — PROCALCITONIN: Procalcitonin: 0.48 ng/mL

## 2019-12-27 LAB — GLUCOSE, CAPILLARY
Glucose-Capillary: 130 mg/dL — ABNORMAL HIGH (ref 70–99)
Glucose-Capillary: 255 mg/dL — ABNORMAL HIGH (ref 70–99)
Glucose-Capillary: 351 mg/dL — ABNORMAL HIGH (ref 70–99)
Glucose-Capillary: 292 mg/dL — ABNORMAL HIGH (ref 70–99)

## 2019-12-27 MED ORDER — IPRATROPIUM-ALBUTEROL 0.5-2.5 (3) MG/3ML IN SOLN
3.0000 mL | Freq: Four times a day (QID) | RESPIRATORY_TRACT | Status: DC
  Administered 2019-12-27 – 2019-12-28 (×7): 3 mL via RESPIRATORY_TRACT
  Filled 2019-12-27 (×7): qty 3

## 2019-12-27 MED ORDER — AZITHROMYCIN 250 MG PO TABS: 500.0000 mg | ORAL_TABLET | Freq: Every day | ORAL | Status: DC

## 2019-12-27 MED ORDER — PREDNISONE 20 MG PO TABS: 50.0000 mg | ORAL_TABLET | Freq: Every day | ORAL | Status: DC

## 2019-12-27 NOTE — Progress Notes (Signed)
[name]  MRN: [id number]  DOB/AGE: [birth year] 59 y.o.  Primary Care Physician:Hande, Cherlyn Labella, MD  Admit date: 12/24/2019  Chief Complaint:  Chief Complaint  Patient presents with  . Weakness  . Shortness of Breath    S-Pt presented on  12/24/2019 with  Chief Complaint  Patient presents with  . Weakness  . Shortness of Breath  .  Patient offers no specific complaints.  On further questioning patient mention I need this oxygen to breathe.  Patient offers no complaint of fever cough or chills  Medications . aspirin EC  81 mg Oral Daily  . carvedilol  6.25 mg Oral BID WC  . Chlorhexidine Gluconate Cloth  6 each Topical Daily  . feeding supplement (GLUCERNA SHAKE)  237 mL Oral BID BM  . folic acid  1 mg Oral Daily  . hydrALAZINE  25 mg Oral BID  . insulin aspart  0-5 Units Subcutaneous QHS  . insulin aspart  0-9 Units Subcutaneous TID WC  . insulin glargine  6 Units Subcutaneous Daily  . melatonin  2.5 mg Oral QHS  . multivitamin  1 tablet Oral Daily  . nicotine  7 mg Transdermal Daily  . pantoprazole (PROTONIX) IV  40 mg Intravenous Q12H  . sertraline  100 mg Oral Daily  . thiamine  100 mg Oral Daily         NLG:XQJJH from the symptoms mentioned above,there are no other symptoms referable to all systems reviewed.  Physical Exam: Vital signs in last 24 hours: Temp:  [97.5 F (36.4 C)-98.4 F (36.9 C)] 97.5 F (36.4 C) (08/15 0317) Pulse Rate:  [81-93] 83 (08/15 0317) Resp:  [11-22] 20 (08/15 0317) BP: (141-165)/(84-95) 141/95 (08/15 0317) SpO2:  [94 %-98 %] 95 % (08/15 0317) Weight change:  Last BM Date: 12/26/19  Intake/Output from previous day: 08/14 0701 - 08/15 0700 In: 350.8 [P.O.:240; IV Piggyback:110.8] Out: 2000  No intake/output data recorded.   Physical Exam: General- pt is awake,alert, oriented to time place and person Resp- No acute REsp distress, decreased breath sound at bases, crackles present CVS- S1S2 regular in rate and  rhythm GIT- BS+, soft, NT, ND EXT- NO LE Edema, Cyanosis Access patient has permacath in situ  Lab Results: CBC Recent Labs    12/26/19 0938 12/26/19 0938 12/26/19 1345 12/27/19 0503  WBC 5.7  --   --  4.7  HGB 8.0*   < > 7.7* 7.9*  HCT 24.9*  --   --  24.2*  PLT 272  --   --  244   < > = values in this interval not displayed.    BMET Recent Labs    12/24/19 1229 12/24/19 1229 12/25/19 1320 12/26/19 0938  NA 140   < > 142 137  K 3.4*   < > 3.5 4.1  CL 97*   < > 101 98  CO2 32  --   --  26  GLUCOSE 277*   < > 77 492*  BUN 20   < > 26* 34*  CREATININE 2.54*   < > 3.50* 4.56*  CALCIUM 7.9*  --   --  8.0*   < > = values in this interval not displayed.    MICRO Recent Results (from the past 240 hour(s))  Gastrointestinal Panel by PCR , Stool     Status: None   Collection Time: 12/24/19  5:12 PM   Specimen: Stool  Result Value Ref Range Status   Campylobacter species NOT DETECTED NOT  DETECTED Final   Plesimonas shigelloides NOT DETECTED NOT DETECTED Final   Salmonella species NOT DETECTED NOT DETECTED Final   Yersinia enterocolitica NOT DETECTED NOT DETECTED Final   Vibrio species NOT DETECTED NOT DETECTED Final   Vibrio cholerae NOT DETECTED NOT DETECTED Final   Enteroaggregative E coli (EAEC) NOT DETECTED NOT DETECTED Final   Enteropathogenic E coli (EPEC) NOT DETECTED NOT DETECTED Final   Enterotoxigenic E coli (ETEC) NOT DETECTED NOT DETECTED Final   Shiga like toxin producing E coli (STEC) NOT DETECTED NOT DETECTED Final   Shigella/Enteroinvasive E coli (EIEC) NOT DETECTED NOT DETECTED Final   Cryptosporidium NOT DETECTED NOT DETECTED Final   Cyclospora cayetanensis NOT DETECTED NOT DETECTED Final   Entamoeba histolytica NOT DETECTED NOT DETECTED Final   Giardia lamblia NOT DETECTED NOT DETECTED Final   Adenovirus F40/41 NOT DETECTED NOT DETECTED Final   Astrovirus NOT DETECTED NOT DETECTED Final   Norovirus GI/GII NOT DETECTED NOT DETECTED Final    Rotavirus A NOT DETECTED NOT DETECTED Final   Sapovirus (I, II, IV, and V) NOT DETECTED NOT DETECTED Final    Comment: Performed at [facility], [address] [place], [place] [address]  C Difficile Quick Screen w PCR reflex     Status: None   Collection Time: 12/24/19  5:12 PM   Specimen: STOOL  Result Value Ref Range Status   C Diff antigen NEGATIVE NEGATIVE Final   C Diff toxin NEGATIVE NEGATIVE Final   C Diff interpretation No C. difficile detected.  Final    Comment: Performed at [facility], [address] [place], [place] [address]  SARS Coronavirus 2 by RT PCR (hospital order, performed in [facility] hospital lab) Nasopharyngeal Nasopharyngeal Swab     Status: None   Collection Time: 12/24/19  5:12 PM   Specimen: Nasopharyngeal Swab  Result Value Ref Range Status   SARS Coronavirus 2 NEGATIVE NEGATIVE Final    Comment: (NOTE) SARS-CoV-2 target nucleic acids are NOT DETECTED.  The SARS-CoV-2 RNA is generally detectable in upper and lower respiratory specimens during the acute phase of infection. The lowest concentration of SARS-CoV-2 viral copies this assay can detect is 250 copies / mL. A negative result does not preclude SARS-CoV-2 infection and should not be used as the sole basis for treatment or other patient management decisions.  A negative result may occur with improper specimen collection / handling, submission of specimen other than nasopharyngeal swab, presence of viral mutation(s) within the areas targeted by this assay, and inadequate number of viral copies (<250 copies / mL). A negative result must be combined with clinical observations, patient history, and epidemiological information.  Fact Sheet for Patients:   StrictlyIdeas.no  Fact Sheet for Healthcare Providers: BankingDealers.co.za  This test is not yet approved or  cleared by the [place] FDA and has been authorized for  detection and/or diagnosis of SARS-CoV-2 by FDA under an Emergency Use Authorization (EUA).  This EUA will remain in effect (meaning this test can be used) for the duration of the COVID-19 declaration under Section 564(b)(1) of the Act, 21 U.S.C. section 360bbb-3(b)(1), unless the authorization is terminated or revoked sooner.  Performed at [facility], [address] [place],  [address]       Lab Results  Component Value Date   CALCIUM 8.0 (L) 12/26/2019   CAION 1.00 (L) 12/25/2019   PHOS 6.6 (H) 12/26/2019  Impression:  Patient is a 59 year old African-American male with a past medical history of end-stage renal disease on hemodialysis, diabetes mellitus type 2, hypertension, history of CVA, hyperlipidemia, history of alcohol abuse who was admitted on December 24, 2019 with chief complaint of Cough Weakness Anemia Diarrhea  1)Renal end-stage renal disease Patient has history of ESRD, patient is on Tuesday Thursday Saturday schedule. Patient undergoes dialysis at [facility].  Patient is under [place] care.  Patient was last dialyzed yesterday No need for renal replacement therapy today  we will dialyze patient again in the morning to help with the fluid removal   2)HTN Blood pressure is at goal  3)Anemia of chronic disease with a component of GI bleed Patient did receive PRBC during the admission. Patient did under go EGD as well as colonoscopy  Hemoglobin was not at goal We will keep patient on Epogen during dialysis    4) secondary hyperparathyroidism -CKD Mineral-Bone Disorder  Patient has secondary hyperparathyroidism with hyperphosphatemia and hypocalcemia Patient outpatient lab was reviewed the patient had PTH of 490 pg/mL phosphorus of 8.4 with a calcium of 7.8  We will follow up on phosphorus and start binders outpatient   5) fluid overload Patient chest x-ray was reviewed which showed patient has  pleural effusion andPulmonary edema  IMPRESSION: Chest x-ray done on August 15 Changes consistent with congestive failure with effusions and interstitial edema. The overall appearance is similar to that seen on the prior exam.   6) electrolytes   sodium Normonatremic   potassium Hypokalemia Now better    7)Acid base Co2 at goal     Plan:  No need for dialysis today We will dialyze patient in the morning  Journey Castonguay s [facility] 12/27/2019, 7:17 AM

## 2019-12-27 NOTE — Progress Notes (Addendum)
PROGRESS NOTE    [name]  TDS:[id number] DOB: December 01, 1960 DOA: 12/24/2019 PCP: [name], MD   Chief complaint.  Shortness of breath.  Brief Narrative:  [name] a 59 y.o.malepast medical history significant for diabetes, stroke, hypertension, history of alcohol use, [name],Thursday and Saturday,[name] Hemoglobin was at 6. Patient reports a history of shortness of breath on exertion for the last 3 to 4 months. He also reported history of episode ofblack stool a week prior to admission.  8/14.  Hemoglobin continued drop to 7.7.  We will keep patient for 1 more day.  8/15.  Patient developed significant bronchospasm overnight, was placed on high flow oxygen.  Treated with DuoNeb for bronchospasm.  Procalcitonin level not significantly elevated.     Assessment & Plan:   Active Problems:   Iron deficiency anemia   Uncontrolled diabetes mellitus (HCC)   CVA (cerebral vascular accident) ([place])   Type 2 diabetes mellitus without complication, with long-term current use of insulin (HCC)   ESRD (end stage renal disease) (HCC)   Anemia  #1. Acute hypoxemic respiratory failure.  Seem to be secondary to volume overload.  Reviewed the patient chest x-ray, evidence of interstitial edema.  Procalcitonin level is higher, but can be explained by end-stage renal disease.  I do not feel patient has infection.  Started DuoNeb for bronchospasm.  Discussed with Dr. Theador Hawthorne, he has evaluated patient, patient will get an extra dialysis on Monday.  #2.  Severe iron deficient anemia. EGD did not show any evidence of bleeding.  Received IV iron yesterday.  Hemoglobin is more stable.  Recheck a CBC tomorrow.  #3.  Uncontrolled type 2 diabetes with hypoglycemia alternating with hyperglycemia. Continue current Lantus and  sliding scale insulin.  4.  End-stage renal disease. Followed by nephrology.  5.  Moderate protein calorie malnutrition. Continue supplement.     DVT prophylaxis: SCDs Code Status: Full Family Communication:  Talked with wife on the phone. Disposition Plan:   Patient came from:                                                                                                                          Anticipated d/c place:  Barriers to d/c OR conditions which need to be met to effect a safe d/c:   Consultants:   Nephrology  Procedures: HD  Antimicrobials:None   Subjective: Patient had increased oxygen requirement overnight, increased bronchospasm and some short of breath this morning.  Cough, nonproductive. No fever or chills. No abdominal pain nausea vomiting.  Had multiple soft stools.    Objective: Vitals:   12/26/19 1045 12/26/19 2040 12/27/19 0317 12/27/19 0838  BP: (!) 152/84 (!) 165/84 (!) 141/95 (!) 142/94  Pulse: 81 93 83 100  Resp: (!) _0 Temp:  97.8 F (36.6 C) (!)  97.5 F (36.4 C)   TempSrc:  Oral Oral   SpO2:  94% 95% 99%  Weight:      Height:        Intake/Output Summary (Last 24 hours) at 12/27/2019 0843 Last data filed at 12/26/2019 1900 Gross per 24 hour  Intake 350.84 ml  Output 2000 ml  Net -1649.16 ml   Filed Weights   12/24/19 1208 12/25/19 1328  Weight: 61.2 kg 62.5 kg    Examination:  General exam: Appears calm and comfortable  Respiratory system: Significant rhonchi in the base. Respiratory effort normal. Cardiovascular system: S1 & S2 heard, RRR. No JVD, murmurs, rubs, gallops or clicks. No pedal edema. Gastrointestinal system: Abdomen is nondistended, soft and nontender. No organomegaly or masses felt. Normal bowel sounds heard. Central nervous system: Alert and oriented. No focal neurological deficits. Extremities: Symmetric  Skin: No rashes, lesions or ulcers Psychiatry: Judgement and insight appear normal.  Mood & affect appropriate.     Data Reviewed: I have personally reviewed following labs and imaging studies  CBC: Recent Labs  Lab 12/24/19 1229 12/24/19 1229 12/25/19 0216 12/25/19 1320 12/26/19 0938 12/26/19 1345 12/27/19 0503  WBC 4.9  --   --   --  5.7  --  4.7  NEUTROABS  --   --   --   --   --   --  2.8  HGB 6.6*   < > 8.9* 8.5* 8.0* 7.7* 7.9*  HCT 20.8*  --  28.8* 25.0* 24.9*  --  24.2*  MCV 95.9  --   --   --  95.0  --  93.1  PLT 257  --   --   --  272  --  244   < > = values in this interval not displayed.   Basic Metabolic Panel: Recent Labs  Lab 12/24/19 1229 12/25/19 1320 12/26/19 0938  NA 140 142 137  K 3.4* 3.5 4.1  CL 97* 101 98  CO2 32  --  26  GLUCOSE 277* 77 492*  BUN 20 26* 34*  CREATININE 2.54* 3.50* 4.56*  CALCIUM 7.9*  --  8.0*  PHOS  --   --  6.6*   GFR: Estimated Creatinine Clearance: 15.2 mL/min (A) (by C-G formula based on SCr of 4.56 mg/dL (H)). Liver Function Tests: Recent Labs  Lab 12/26/19 0938  AST 43*  ALT 30  ALKPHOS 178*  BILITOT 0.7  PROT 6.1*  ALBUMIN 2.5*  2.5*   No results for input(s): LIPASE, AMYLASE in the last 168 hours. No results for input(s): AMMONIA in the last 168 hours. Coagulation Profile: No results for input(s): INR, PROTIME in the last 168 hours. Cardiac Enzymes: No results for input(s): CKTOTAL, CKMB, CKMBINDEX, TROPONINI in the last 168 hours. BNP (last 3 results) No results for input(s): PROBNP in the last 8760 hours. HbA1C: No results for input(s): HGBA1C in the last 72 hours. CBG: Recent Labs  Lab 12/26/19 0816 12/26/19 0902 12/26/19 1444 12/26/19 2125 12/27/19 0748  GLUCAP 442* 449* 226* 376* 130*   Lipid Profile: No results for input(s): CHOL, HDL, LDLCALC, TRIG, CHOLHDL, LDLDIRECT in the last 72 hours. Thyroid Function Tests: No results for input(s): TSH, T4TOTAL, FREET4, T3FREE, THYROIDAB in the last 72 hours. Anemia Panel: Recent Labs    12/24/19 1712  VITAMINB12 1,163*   FOLATE 80.0  FERRITIN 136  TIBC 269  IRON 42*  RETICCTPCT 5.8*   Sepsis Labs: Recent Labs  Lab 12/27/19 0503  PROCALCITON 0.48  Recent Results (from the past 240 hour(s))  Gastrointestinal Panel by PCR , Stool     Status: None   Collection Time: 12/24/19  5:12 PM   Specimen: Stool  Result Value Ref Range Status   Campylobacter species NOT DETECTED NOT DETECTED Final   Plesimonas shigelloides NOT DETECTED NOT DETECTED Final   Salmonella species NOT DETECTED NOT DETECTED Final   Yersinia enterocolitica NOT DETECTED NOT DETECTED Final   Vibrio species NOT DETECTED NOT DETECTED Final   Vibrio cholerae NOT DETECTED NOT DETECTED Final   Enteroaggregative E coli (EAEC) NOT DETECTED NOT DETECTED Final   Enteropathogenic E coli (EPEC) NOT DETECTED NOT DETECTED Final   Enterotoxigenic E coli (ETEC) NOT DETECTED NOT DETECTED Final   Shiga like toxin producing E coli (STEC) NOT DETECTED NOT DETECTED Final   Shigella/Enteroinvasive E coli (EIEC) NOT DETECTED NOT DETECTED Final   Cryptosporidium NOT DETECTED NOT DETECTED Final   Cyclospora cayetanensis NOT DETECTED NOT DETECTED Final   Entamoeba histolytica NOT DETECTED NOT DETECTED Final   Giardia lamblia NOT DETECTED NOT DETECTED Final   Adenovirus F40/41 NOT DETECTED NOT DETECTED Final   Astrovirus NOT DETECTED NOT DETECTED Final   Norovirus GI/GII NOT DETECTED NOT DETECTED Final   Rotavirus A NOT DETECTED NOT DETECTED Final   Sapovirus (I, II, IV, and V) NOT DETECTED NOT DETECTED Final    Comment: Performed at [facility], [address] [place], [place] [address]  C Difficile Quick Screen w PCR reflex     Status: None   Collection Time: 12/24/19  5:12 PM   Specimen: STOOL  Result Value Ref Range Status   C Diff antigen NEGATIVE NEGATIVE Final   C Diff toxin NEGATIVE NEGATIVE Final   C Diff interpretation No C. difficile detected.  Final    Comment: Performed at [facility], [address]  [place], [place] [address]  SARS Coronavirus 2 by RT PCR (hospital order, performed in [facility] hospital lab) Nasopharyngeal Nasopharyngeal Swab     Status: None   Collection Time: 12/24/19  5:12 PM   Specimen: Nasopharyngeal Swab  Result Value Ref Range Status   SARS Coronavirus 2 NEGATIVE NEGATIVE Final    Comment: (NOTE) SARS-CoV-2 target nucleic acids are NOT DETECTED.  The SARS-CoV-2 RNA is generally detectable in upper and lower respiratory specimens during the acute phase of infection. The lowest concentration of SARS-CoV-2 viral copies this assay can detect is 250 copies / mL. A negative result does not preclude SARS-CoV-2 infection and should not be used as the sole basis for treatment or other patient management decisions.  A negative result may occur with improper specimen collection / handling, submission of specimen other than nasopharyngeal swab, presence of viral mutation(s) within the areas targeted by this assay, and inadequate number of viral copies (<250 copies / mL). A negative result must be combined with clinical observations, patient history, and epidemiological information.  Fact Sheet for Patients:   StrictlyIdeas.no  Fact Sheet for Healthcare Providers: BankingDealers.co.za  This test is not yet approved or  cleared by the [place] FDA and has been authorized for detection and/or diagnosis of SARS-CoV-2 by FDA under an Emergency Use Authorization (EUA).  This EUA will remain in effect (meaning this test can be used) for the duration of the COVID-19 declaration under Section 564(b)(1) of the Act, 21 U.S.C. section 360bbb-3(b)(1), unless the authorization is terminated or revoked sooner.  Performed at [facility], [address], [place], [place] [address]  Radiology Studies: DG Chest 2 View  Result Date: 12/27/2019 CLINICAL DATA:  COPD, shortness of breath EXAM: CHEST - 2 VIEW  COMPARISON:  12/24/2019 FINDINGS: Cardiac shadow is again enlarged. Aortic calcifications are seen. Dialysis catheter is again noted. Diffuse interstitial edema is again identified. Effusions have increased slightly in the interval from the prior exam. No bony abnormality is noted. IMPRESSION: Changes consistent with congestive failure with effusions and interstitial edema. The overall appearance is similar to that seen on the prior exam. Electronically Signed   By: Inez Catalina M.D.   On: 12/27/2019 08:34        Scheduled Meds: . aspirin EC  81 mg Oral Daily  . azithromycin  500 mg Oral Daily  . carvedilol  6.25 mg Oral BID WC  . Chlorhexidine Gluconate Cloth  6 each Topical Daily  . feeding supplement (GLUCERNA SHAKE)  237 mL Oral BID BM  . folic acid  1 mg Oral Daily  . hydrALAZINE  25 mg Oral BID  . insulin aspart  0-5 Units Subcutaneous QHS  . insulin aspart  0-9 Units Subcutaneous TID WC  . insulin glargine  6 Units Subcutaneous Daily  . ipratropium-albuterol  3 mL Nebulization Q6H  . melatonin  2.5 mg Oral QHS  . multivitamin  1 tablet Oral Daily  . nicotine  7 mg Transdermal Daily  . pantoprazole (PROTONIX) IV  40 mg Intravenous Q12H  . predniSONE  50 mg Oral Q breakfast  . sertraline  100 mg Oral Daily  . thiamine  100 mg Oral Daily   Continuous Infusions:   LOS: 2 days    Time spent: 35 minutes    Sharen Hones, MD [facility]   To contact the attending provider between 7A-7P or the covering provider during after hours 7P-7A, please log into the web site [email or web address] and access using universal [place] password for that web site. If you do not have the password, please call the hospital operator.  12/27/2019, 8:43 AM

## 2019-12-28 ENCOUNTER — Encounter: Payer: Self-pay | Admitting: Gastroenterology

## 2019-12-28 ENCOUNTER — Inpatient Hospital Stay: Payer: Medicare Other

## 2019-12-28 LAB — CBC WITH DIFFERENTIAL/PLATELET
Abs Immature Granulocytes: 0.02 10*3/uL (ref 0.00–0.07)
Basophils Absolute: 0 10*3/uL (ref 0.0–0.1)
Basophils Relative: 0 %
Eosinophils Absolute: 0.2 10*3/uL (ref 0.0–0.5)
Eosinophils Relative: 3 %
HCT: 25.5 % — ABNORMAL LOW (ref 39.0–52.0)
Hemoglobin: 8.4 g/dL — ABNORMAL LOW (ref 13.0–17.0)
Immature Granulocytes: 0 %
Lymphocytes Relative: 23 %
Lymphs Abs: 1.4 10*3/uL (ref 0.7–4.0)
MCH: 30.7 pg (ref 26.0–34.0)
MCHC: 32.9 g/dL (ref 30.0–36.0)
MCV: 93.1 fL (ref 80.0–100.0)
Monocytes Absolute: 0.6 10*3/uL (ref 0.1–1.0)
Monocytes Relative: 9 %
Neutro Abs: 3.9 10*3/uL (ref 1.7–7.7)
Neutrophils Relative %: 65 %
Platelets: 246 10*3/uL (ref 150–400)
RBC: 2.74 MIL/uL — ABNORMAL LOW (ref 4.22–5.81)
RDW: 16.4 % — ABNORMAL HIGH (ref 11.5–15.5)
WBC: 6.1 10*3/uL (ref 4.0–10.5)
nRBC: 0 % (ref 0.0–0.2)

## 2019-12-28 LAB — GLUCOSE, CAPILLARY
Glucose-Capillary: 116 mg/dL — ABNORMAL HIGH (ref 70–99)
Glucose-Capillary: 354 mg/dL — ABNORMAL HIGH (ref 70–99)
Glucose-Capillary: 350 mg/dL — ABNORMAL HIGH (ref 70–99)

## 2019-12-28 LAB — CREATININE, SERUM
Creatinine, Ser: 4.27 mg/dL — ABNORMAL HIGH (ref 0.61–1.24)
GFR calc Af Amer: 16 mL/min — ABNORMAL LOW (ref >60)
GFR calc non Af Amer: 14 mL/min — ABNORMAL LOW (ref >60)

## 2019-12-28 MED ORDER — HEPARIN SODIUM (PORCINE) 5000 UNIT/ML IJ SOLN
5000.0000 [IU] | Freq: Three times a day (TID) | INTRAMUSCULAR | Status: DC
  Administered 2019-12-28 – 2020-01-04 (×18): 5000 [IU] via SUBCUTANEOUS
  Filled 2019-12-28 (×18): qty 1

## 2019-12-28 MED ORDER — IPRATROPIUM-ALBUTEROL 0.5-2.5 (3) MG/3ML IN SOLN
3.0000 mL | Freq: Four times a day (QID) | RESPIRATORY_TRACT | Status: DC | PRN
  Administered 2019-12-30 – 2020-01-02 (×2): 3 mL via RESPIRATORY_TRACT
  Filled 2019-12-28 (×2): qty 3

## 2019-12-28 MED ORDER — SODIUM CHLORIDE 0.9 % IV SOLN
3.0000 g | Freq: Two times a day (BID) | INTRAVENOUS | Status: DC
  Administered 2019-12-28 – 2019-12-30 (×4): 3 g via INTRAVENOUS
  Filled 2019-12-28 (×2): qty 8
  Filled 2019-12-28: qty 3
  Filled 2019-12-28: qty 8
  Filled 2019-12-28: qty 3
  Filled 2019-12-28: qty 8

## 2019-12-28 MED ORDER — IOHEXOL 350 MG/ML SOLN
75.0000 mL | Freq: Once | INTRAVENOUS | Status: AC | PRN
  Administered 2019-12-28: 75 mL via INTRAVENOUS

## 2019-12-28 NOTE — Progress Notes (Addendum)
PROGRESS NOTE    HA PLACERES  RJJ:[id number] DOB: 07-17-1960 DOA: 12/24/2019 PCP: [name], MD   Chief complaint.  Shortness of breath and hypoxemia.  Brief Narrative:  [name] a 59 y.o.malepast medical history significant for diabetes, stroke, hypertension, history of alcohol use, [name],Thursday and Saturday,[name] Hemoglobin was at 6. Patient reports a history of shortness of breath on exertion for the last 3 to 4 months. He also reported history of episode ofblack stool a week prior to admission.  8/14.Hemoglobin continued drop to 7.7. We will keep patient for 1 more day.  8/15.  Patient developed significant bronchospasm overnight, was placed on high flow oxygen.  Treated with DuoNeb for bronchospasm.  Procalcitonin level not significantly elevated.   8/16.  Patient still has significant hypoxemia after hemodialysis.  Consider aspiration pneumonia.  Start Unasyn.   Assessment & Plan:   Active Problems:   Iron deficiency anemia   Uncontrolled diabetes mellitus (HCC)   CVA (cerebral vascular accident) ([place])   Type 2 diabetes mellitus without complication, with long-term current use of insulin (HCC)   ESRD (end stage renal disease) (HCC)   Anemia  #1.  Acute on chronic hypoxemic respiratory failure. Hypoxemia was not getting better after dialysis.  Condition most likely due to aspiration pneumonia as patient had a sudden onset of wheezing and hypoxemia overnight.  He is a procalcitonin level is not significant elevated; level compatible with end-stage renal disease.  Will cover with Unasyn for aspiration pneumonia.  Obtain CT angiogram to rule out a PE. Patient was chronically on oxygen 2.5 L.  2.  Uncontrolled type 2 diabetes with hypoglycemia and hyperglycemia. Continue current Lantus and  sliding scale insulin.  3.  Severe iron deficient anemia. Hemoglobin finally gets better.  4.  End-stage renal disease. Followed by nephrology.  5.  Moderate protein calorie malnutrition.  Continue supplement.   DVT prophylaxis:heparin Code Status:Full Family Communication: Talked with wife on the phone. Disposition Plan:  Patient came from:  Anticipated d/c place:  Barriers to d/c OR conditions which need to be met to effect a safe d/c:   Consultants:  Nephrology  Procedures:HD  Antimicrobials:None   Subjective: Seen patient after dialysis.  Patient feels well, wheezing is better.  However, patient still has significant hypoxemia, currently on 5 L oxygen.  Cannot wean down oxygen.  He has a cough, nonproductive.  He does not have fever or chills.  He does not have chest pain. No nausea vomiting.  No abdominal pain.  Objective: Vitals:   12/28/19 1230 12/28/19 1245 12/28/19 1250 12/28/19 1357  BP: (!) 142/68 (!) 151/74 136/71 (!) 150/71  Pulse: 81 77 78 82  Resp: 19 16 13 18   Temp:   98.2 F (36.8 C) 98.2 F (36.8 C)  TempSrc:   Oral Oral  SpO2:   100% 91%  Weight:      Height:        Intake/Output Summary (Last 24 hours) at 12/28/2019 1441 Last data filed at 12/28/2019 1250 Gross per 24 hour  Intake 480 ml  Output 2000 ml  Net -1520 ml   Filed Weights   12/24/19 1208 12/25/19 1328  Weight: 61.2 kg 62.5 kg    Examination:  General exam: Appears calm and comfortable  Respiratory system: Clear to auscultation. Respiratory effort normal. Cardiovascular system: S1 & S2 heard, RRR.  No JVD, murmurs, rubs, gallops or clicks. No pedal edema. Gastrointestinal system: Abdomen is nondistended, soft and nontender. No organomegaly or masses felt. Normal bowel sounds heard. Central nervous system: Alert and oriented. No focal  neurological deficits. Extremities: Symmetric  Skin: No rashes, lesions or ulcers Psychiatry:  Mood & affect appropriate.     Data Reviewed: I have personally reviewed following labs and imaging studies  CBC: Recent Labs  Lab 12/24/19 1229 12/24/19 1229 12/25/19 0216 12/25/19 0216 12/25/19 1320 12/26/19 0938 12/26/19 1345 12/27/19 0503 12/28/19 0621  WBC 4.9  --   --   --   --  5.7  --  4.7 6.1  NEUTROABS  --   --   --   --   --   --   --  2.8 3.9  HGB 6.6*   < > 8.9*   < > 8.5* 8.0* 7.7* 7.9* 8.4*  HCT 20.8*   < > 28.8*  --  25.0* 24.9*  --  24.2* 25.5*  MCV 95.9  --   --   --   --  95.0  --  93.1 93.1  PLT 257  --   --   --   --  272  --  244 246   < > = values in this interval not displayed.   Basic Metabolic Panel: Recent Labs  Lab 12/24/19 1229 12/25/19 1320 12/26/19 0938  NA 140 142 137  K 3.4* 3.5 4.1  CL 97* 101 98  CO2 32  --  26  GLUCOSE 277* 77 492*  BUN 20 26* 34*  CREATININE 2.54* 3.50* 4.56*  CALCIUM 7.9*  --  8.0*  PHOS  --   --  6.6*   GFR: Estimated Creatinine Clearance: 15.2 mL/min (A) (by C-G formula based on SCr of 4.56 mg/dL (H)). Liver Function Tests: Recent Labs  Lab 12/26/19 0938  AST 43*  ALT 30  ALKPHOS 178*  BILITOT 0.7  PROT 6.1*  ALBUMIN 2.5*  2.5*   No results for input(s): LIPASE, AMYLASE in the last 168 hours. No results for input(s): AMMONIA in the last 168 hours. Coagulation Profile: No results for input(s): INR, PROTIME in the last 168 hours. Cardiac Enzymes: No results for input(s): CKTOTAL, CKMB, CKMBINDEX, TROPONINI in the last 168 hours. BNP (last 3 results) No results for input(s): PROBNP in the last 8760 hours. HbA1C: No results for input(s): HGBA1C in the last 72 hours. CBG: Recent Labs  Lab 12/27/19 0748 12/27/19 1117 12/27/19 1656 12/27/19 2131 12/28/19 0746  GLUCAP 130* 255* 351* 292* 116*   Lipid Profile: No results for input(s): CHOL, HDL, LDLCALC, TRIG, CHOLHDL, LDLDIRECT in the last 72  hours. Thyroid Function Tests: No results for input(s): TSH, T4TOTAL, FREET4, T3FREE, THYROIDAB in the last 72 hours. Anemia Panel: No results for input(s): VITAMINB12, FOLATE, FERRITIN, TIBC, IRON, RETICCTPCT in the last 72 hours. Sepsis Labs: Recent Labs  Lab 12/27/19 0503  PROCALCITON 0.48    Recent Results (from the past 240 hour(s))  Gastrointestinal Panel by PCR , Stool     Status: None   Collection Time: 12/24/19  5:12 PM   Specimen: Stool  Result Value Ref Range Status   Campylobacter species NOT DETECTED NOT DETECTED Final   Plesimonas shigelloides NOT DETECTED NOT DETECTED Final   Salmonella species NOT DETECTED NOT DETECTED Final   Yersinia enterocolitica NOT DETECTED NOT DETECTED Final   Vibrio species NOT DETECTED NOT DETECTED Final   Vibrio cholerae NOT DETECTED NOT DETECTED Final  Enteroaggregative E coli (EAEC) NOT DETECTED NOT DETECTED Final   Enteropathogenic E coli (EPEC) NOT DETECTED NOT DETECTED Final   Enterotoxigenic E coli (ETEC) NOT DETECTED NOT DETECTED Final   Shiga like toxin producing E coli (STEC) NOT DETECTED NOT DETECTED Final   Shigella/Enteroinvasive E coli (EIEC) NOT DETECTED NOT DETECTED Final   Cryptosporidium NOT DETECTED NOT DETECTED Final   Cyclospora cayetanensis NOT DETECTED NOT DETECTED Final   Entamoeba histolytica NOT DETECTED NOT DETECTED Final   Giardia lamblia NOT DETECTED NOT DETECTED Final   Adenovirus F40/41 NOT DETECTED NOT DETECTED Final   Astrovirus NOT DETECTED NOT DETECTED Final   Norovirus GI/GII NOT DETECTED NOT DETECTED Final   Rotavirus A NOT DETECTED NOT DETECTED Final   Sapovirus (I, II, IV, and V) NOT DETECTED NOT DETECTED Final    Comment: Performed at [facility], [address] [place], [place] [address]  C Difficile Quick Screen w PCR reflex     Status: None   Collection Time: 12/24/19  5:12 PM   Specimen: STOOL  Result Value Ref Range Status   C Diff antigen NEGATIVE NEGATIVE Final   C Diff  toxin NEGATIVE NEGATIVE Final   C Diff interpretation No C. difficile detected.  Final    Comment: Performed at [facility], [address] [place], [place] [address]  SARS Coronavirus 2 by RT PCR (hospital order, performed in [facility] hospital lab) Nasopharyngeal Nasopharyngeal Swab     Status: None   Collection Time: 12/24/19  5:12 PM   Specimen: Nasopharyngeal Swab  Result Value Ref Range Status   SARS Coronavirus 2 NEGATIVE NEGATIVE Final    Comment: (NOTE) SARS-CoV-2 target nucleic acids are NOT DETECTED.  The SARS-CoV-2 RNA is generally detectable in upper and lower respiratory specimens during the acute phase of infection. The lowest concentration of SARS-CoV-2 viral copies this assay can detect is 250 copies / mL. A negative result does not preclude SARS-CoV-2 infection and should not be used as the sole basis for treatment or other patient management decisions.  A negative result may occur with improper specimen collection / handling, submission of specimen other than nasopharyngeal swab, presence of viral mutation(s) within the areas targeted by this assay, and inadequate number of viral copies (<250 copies / mL). A negative result must be combined with clinical observations, patient history, and epidemiological information.  Fact Sheet for Patients:   StrictlyIdeas.no  Fact Sheet for Healthcare Providers: BankingDealers.co.za  This test is not yet approved or  cleared by the [place] FDA and has been authorized for detection and/or diagnosis of SARS-CoV-2 by FDA under an Emergency Use Authorization (EUA).  This EUA will remain in effect (meaning this test can be used) for the duration of the COVID-19 declaration under Section 564(b)(1) of the Act, 21 U.S.C. section 360bbb-3(b)(1), unless the authorization is terminated or revoked sooner.  Performed at [facility], [address],  [place], [place] [address]          Radiology Studies: DG Chest 2 View  Result Date: 12/27/2019 CLINICAL DATA:  COPD, shortness of breath EXAM: CHEST - 2 VIEW COMPARISON:  12/24/2019 FINDINGS: Cardiac shadow is again enlarged. Aortic calcifications are seen. Dialysis catheter is again noted. Diffuse interstitial edema is again identified. Effusions have increased slightly in the interval from the prior exam. No bony abnormality is noted. IMPRESSION: Changes consistent with congestive failure with effusions and interstitial edema. The overall appearance is similar to that seen on the prior  exam. Electronically Signed   By: Inez Catalina M.D.   On: 12/27/2019 08:34        Scheduled Meds: . aspirin EC  81 mg Oral Daily  . carvedilol  6.25 mg Oral BID WC  . Chlorhexidine Gluconate Cloth  6 each Topical Daily  . feeding supplement (GLUCERNA SHAKE)  237 mL Oral BID BM  . folic acid  1 mg Oral Daily  . hydrALAZINE  25 mg Oral BID  . insulin aspart  0-5 Units Subcutaneous QHS  . insulin aspart  0-9 Units Subcutaneous TID WC  . insulin glargine  6 Units Subcutaneous Daily  . ipratropium-albuterol  3 mL Nebulization Q6H  . melatonin  2.5 mg Oral QHS  . multivitamin  1 tablet Oral Daily  . nicotine  7 mg Transdermal Daily  . pantoprazole (PROTONIX) IV  40 mg Intravenous Q12H  . sertraline  100 mg Oral Daily  . thiamine  100 mg Oral Daily   Continuous Infusions: . ampicillin-sulbactam (UNASYN) IV       LOS: 3 days    Time spent: 35 minutes    Sharen Hones, MD [facility]   To contact the attending provider between 7A-7P or the covering provider during after hours 7P-7A, please log into the web site [email or web address] and access using universal [place] password for that web site. If you do not have the password, please call the hospital operator.  12/28/2019, 2:41 PM

## 2019-12-28 NOTE — Progress Notes (Signed)
Inpatient Diabetes Program Recommendations  AACE/ADA: New Consensus Statement on Inpatient Glycemic Control (2015)  Target Ranges:  Prepandial:   less than 140 mg/dL      Peak postprandial:   less than 180 mg/dL (1-2 hours)      Critically ill patients:  140 - 180 mg/dL   Results for [name], [name] (MRN [id number]) as of 12/28/2019 09:52  Ref. Range 12/27/2019 07:48 12/27/2019 11:17 12/27/2019 16:56 12/27/2019 21:31  Glucose-Capillary Latest Ref Range: 70 - 99 mg/dL 130 (H) 255 (H)  5 units NOVOLOG +  6 units LANTUS 351 (H)  9 units NOVOLOG  292 (H)  3 units NOVOLOG    Results for [name], [name] (MRN [id number]) as of 12/28/2019 09:52  Ref. Range 12/28/2019 07:46  Glucose-Capillary Latest Ref Range: 70 - 99 mg/dL 116 (H)    Home DM Meds: Lantus 20 units Daily        Humalog 0-15 units TID per SSI       (per ENDO notes: Lantus 10 units QHS and Humalog 2-6 units BID with Lunch and Dinner only)    Current Orders: Lantus 6 units daily      Novolog Sensitive Correction Scale/ SSI (0-9 units) TID AC + HS    ENDO: Dr. Quentin Cornwall seen 11/18/2019--Was told to take the following: Lantus 10 QD + Humalog 2-6 BID with Lunch and Dinner only  Per Chart Review: Inpatient 07/06/19-07/08/19 with [place], 08/24/19-08/25/19 with [facility], and 09/16/19-09/17/19 with [facility] (left AMA on 09/17/19).    MD- AM CBGs have been good on current dose of Lantus.  Having elevated CBGs in the afternoon likely due to meals.  Please consider adding low dose Novolog Meal Coverage: Novolog 3 units TID with meals to start  (Please add the following Hold Parameters: Hold if pt eats <50% of meal, Hold if pt NPO)    --Will follow patient during hospitalization--  Wyn Quaker RN, MSN, CDE Diabetes Coordinator Inpatient Glycemic Control Team Team Pager: [phone] (8a-5p)

## 2019-12-28 NOTE — Progress Notes (Signed)
Patient seen for respiratory treatment. Patient immediately became anger about having to receive treatment and started cussing at RT.  Treatment changed to as needed. Asked patient to have RN to call if he wants svn.

## 2019-12-28 NOTE — Progress Notes (Signed)
3 Days Post-Op   Subjective/Chief Complaint: Patient seen.  No complaints with his foot.   Objective: Vital signs in last 24 hours: Temp:  [97.5 F (36.4 C)-98.2 F (36.8 C)] 98.2 F (36.8 C) (08/16 1357) Pulse Rate:  [77-99] 82 (08/16 1357) Resp:  [13-24] 18 (08/16 1357) BP: (136-161)/(68-93) 150/71 (08/16 1357) SpO2:  [90 %-100 %] 91 % (08/16 1357) Last BM Date: 12/27/19  Intake/Output from previous day: 08/15 0701 - 08/16 0700 In: 720 [P.O.:720] Out: -  Intake/Output this shift: Total I/O In: 120 [P.O.:120] Out: 2000 [Other:2000]  The bandage is dry and intact on the foot.  The incision is well coapted with no drainage.  Healing well.  Lab Results:  Recent Labs    12/27/19 0503 12/28/19 0621  WBC 4.7 6.1  HGB 7.9* 8.4*  HCT 24.2* 25.5*  PLT 244 246   BMET Recent Labs    12/26/19 0938  NA 137  K 4.1  CL 98  CO2 26  GLUCOSE 492*  BUN 34*  CREATININE 4.56*  CALCIUM 8.0*   PT/INR No results for input(s): LABPROT, INR in the last 72 hours. ABG No results for input(s): PHART, HCO3 in the last 72 hours.  Invalid input(s): PCO2, PO2  Studies/Results: DG Chest 2 View  Result Date: 12/27/2019 CLINICAL DATA:  COPD, shortness of breath EXAM: CHEST - 2 VIEW COMPARISON:  12/24/2019 FINDINGS: Cardiac shadow is again enlarged. Aortic calcifications are seen. Dialysis catheter is again noted. Diffuse interstitial edema is again identified. Effusions have increased slightly in the interval from the prior exam. No bony abnormality is noted. IMPRESSION: Changes consistent with congestive failure with effusions and interstitial edema. The overall appearance is similar to that seen on the prior exam. Electronically Signed   By: Inez Catalina M.D.   On: 12/27/2019 08:34    Anti-infectives: Anti-infectives (From admission, onward)   Start     Dose/Rate Route Frequency Ordered Stop   12/27/19 1000  azithromycin (ZITHROMAX) tablet 500 mg  Status:  Discontinued        500  mg Oral Daily 12/27/19 0843 12/27/19 0851      Assessment/Plan: s/p Procedure(s): ESOPHAGOGASTRODUODENOSCOPY (EGD) WITH PROPOFOL (N/A) Assessment: Good progress status post amputation second toe   Plan: Sutures removed from the incision line.  Tincture of benzoin and Steri-Strips applied across the incision.  Patient may begin showering.  Follow-up outpatient in the clinic in 2 weeks for reevaluation or sooner if any problems.  LOS: 3 days    [name] 12/28/2019

## 2019-12-28 NOTE — Progress Notes (Signed)
[facility], [place] 12/28/19  Subjective:   LOS: 3    HEMODIALYSIS FLOWSHEET:  Blood Flow Rate (mL/min): 400 mL/min Arterial Pressure (mmHg): -160 mmHg Venous Pressure (mmHg): 140 mmHg Transmembrane Pressure (mmHg): 70 mmHg Ultrafiltration Rate (mL/min): 710 mL/min Dialysate Flow Rate (mL/min): 800 ml/min Conductivity: Machine : 13.6 Conductivity: Machine : 13.6 Dialysis Fluid Bolus: Normal Saline Bolus Amount (mL): 250 mL  States he came in for Shortness of breath which is better Also noted to have low Hgb at admission    Objective:  Vital signs in last 24 hours:  Temp:  [97.5 F (36.4 C)-98.4 F (36.9 C)] 98.2 F (36.8 C) (08/16 0920) Pulse Rate:  [88-99] 88 (08/16 1000) Resp:  [14-24] 17 (08/16 1000) BP: (125-161)/(73-93) 149/85 (08/16 1000) SpO2:  [87 %-97 %] 97 % (08/16 0920)  Weight change:  Filed Weights   12/24/19 1208 12/25/19 1328  Weight: 61.2 kg 62.5 kg    Intake/Output:    Intake/Output Summary (Last 24 hours) at 12/28/2019 1015 Last data filed at 12/28/2019 0953 Gross per 24 hour  Intake 600 ml  Output --  Net 600 ml   Physical Exam: General:  No acute distress, laying in the bed  HEENT  anicteric, moist oral mucous membrane  Pulm/lungs  normal breathing effort, lungs are clear to auscultation  CVS/Heart  regular rhythm, no rub or gallop  Abdomen:   Soft, nontender  Extremities:  No peripheral edema  Neurologic:  Alert, oriented, able to follow commands  Skin:  No acute rashes  Rt IJ PC  Basic Metabolic Panel:  Recent Labs  Lab 12/24/19 1229 12/25/19 1320 12/26/19 0938  NA 140 142 137  K 3.4* 3.5 4.1  CL 97* 101 98  CO2 32  --  26  GLUCOSE 277* 77 492*  BUN 20 26* 34*  CREATININE 2.54* 3.50* 4.56*  CALCIUM 7.9*  --  8.0*  PHOS  --   --  6.6*     CBC: Recent Labs  Lab 12/24/19 1229 12/24/19 1229 12/25/19 0216 12/25/19 0216 12/25/19 1320 12/26/19 0938 12/26/19 1345 12/27/19 0503  12/28/19 0621  WBC 4.9  --   --   --   --  5.7  --  4.7 6.1  NEUTROABS  --   --   --   --   --   --   --  2.8 3.9  HGB 6.6*   < > 8.9*   < > 8.5* 8.0* 7.7* 7.9* 8.4*  HCT 20.8*   < > 28.8*  --  25.0* 24.9*  --  24.2* 25.5*  MCV 95.9  --   --   --   --  95.0  --  93.1 93.1  PLT 257  --   --   --   --  272  --  244 246   < > = values in this interval not displayed.      Lab Results  Component Value Date   HEPBSAG NON REACTIVE 12/06/2019   HEPBSAB NON REACTIVE 04/10/2019      Microbiology:  Recent Results (from the past 240 hour(s))  Gastrointestinal Panel by PCR , Stool     Status: None   Collection Time: 12/24/19  5:12 PM   Specimen: Stool  Result Value Ref Range Status   Campylobacter species NOT DETECTED NOT DETECTED Final   Plesimonas shigelloides NOT DETECTED NOT DETECTED Final   Salmonella species NOT DETECTED NOT DETECTED Final   Yersinia enterocolitica NOT DETECTED  NOT DETECTED Final   Vibrio species NOT DETECTED NOT DETECTED Final   Vibrio cholerae NOT DETECTED NOT DETECTED Final   Enteroaggregative E coli (EAEC) NOT DETECTED NOT DETECTED Final   Enteropathogenic E coli (EPEC) NOT DETECTED NOT DETECTED Final   Enterotoxigenic E coli (ETEC) NOT DETECTED NOT DETECTED Final   Shiga like toxin producing E coli (STEC) NOT DETECTED NOT DETECTED Final   Shigella/Enteroinvasive E coli (EIEC) NOT DETECTED NOT DETECTED Final   Cryptosporidium NOT DETECTED NOT DETECTED Final   Cyclospora cayetanensis NOT DETECTED NOT DETECTED Final   Entamoeba histolytica NOT DETECTED NOT DETECTED Final   Giardia lamblia NOT DETECTED NOT DETECTED Final   Adenovirus F40/41 NOT DETECTED NOT DETECTED Final   Astrovirus NOT DETECTED NOT DETECTED Final   Norovirus GI/GII NOT DETECTED NOT DETECTED Final   Rotavirus A NOT DETECTED NOT DETECTED Final   Sapovirus (I, II, IV, and V) NOT DETECTED NOT DETECTED Final    Comment: Performed at [facility], [address] [name], [place]  [address]  C Difficile Quick Screen w PCR reflex     Status: None   Collection Time: 12/24/19  5:12 PM   Specimen: STOOL  Result Value Ref Range Status   C Diff antigen NEGATIVE NEGATIVE Final   C Diff toxin NEGATIVE NEGATIVE Final   C Diff interpretation No C. difficile detected.  Final    Comment: Performed at [facility], [address] [place], [place] [address]  SARS Coronavirus 2 by RT PCR (hospital order, performed in [facility] hospital lab) Nasopharyngeal Nasopharyngeal Swab     Status: None   Collection Time: 12/24/19  5:12 PM   Specimen: Nasopharyngeal Swab  Result Value Ref Range Status   SARS Coronavirus 2 NEGATIVE NEGATIVE Final    Comment: (NOTE) SARS-CoV-2 target nucleic acids are NOT DETECTED.  The SARS-CoV-2 RNA is generally detectable in upper and lower respiratory specimens during the acute phase of infection. The lowest concentration of SARS-CoV-2 viral copies this assay can detect is 250 copies / mL. A negative result does not preclude SARS-CoV-2 infection and should not be used as the sole basis for treatment or other patient management decisions.  A negative result may occur with improper specimen collection / handling, submission of specimen other than nasopharyngeal swab, presence of viral mutation(s) within the areas targeted by this assay, and inadequate number of viral copies (<250 copies / mL). A negative result must be combined with clinical observations, patient history, and epidemiological information.  Fact Sheet for Patients:   StrictlyIdeas.no  Fact Sheet for Healthcare Providers: BankingDealers.co.za  This test is not yet approved or  cleared by the [place] FDA and has been authorized for detection and/or diagnosis of SARS-CoV-2 by FDA under an Emergency Use Authorization (EUA).  This EUA will remain in effect (meaning this test can be used) for the duration of the COVID-19  declaration under Section 564(b)(1) of the Act, 21 U.S.C. section 360bbb-3(b)(1), unless the authorization is terminated or revoked sooner.  Performed at [facility], [address] [place], [place] [address]     Coagulation Studies: No results for input(s): LABPROT, INR in the last 72 hours.  Urinalysis: No results for input(s): COLORURINE, LABSPEC, PHURINE, GLUCOSEU, HGBUR, BILIRUBINUR, KETONESUR, PROTEINUR, UROBILINOGEN, NITRITE, LEUKOCYTESUR in the last 72 hours.  Invalid input(s): APPERANCEUR    Imaging: DG Chest 2 View  Result Date: 12/27/2019 CLINICAL DATA:  COPD, shortness of breath EXAM: CHEST - 2 VIEW COMPARISON:  12/24/2019  FINDINGS: Cardiac shadow is again enlarged. Aortic calcifications are seen. Dialysis catheter is again noted. Diffuse interstitial edema is again identified. Effusions have increased slightly in the interval from the prior exam. No bony abnormality is noted. IMPRESSION: Changes consistent with congestive failure with effusions and interstitial edema. The overall appearance is similar to that seen on the prior exam. Electronically Signed   By: Inez Catalina M.D.   On: 12/27/2019 08:34     Medications:    . aspirin EC  81 mg Oral Daily  . carvedilol  6.25 mg Oral BID WC  . Chlorhexidine Gluconate Cloth  6 each Topical Daily  . feeding supplement (GLUCERNA SHAKE)  237 mL Oral BID BM  . folic acid  1 mg Oral Daily  . hydrALAZINE  25 mg Oral BID  . insulin aspart  0-5 Units Subcutaneous QHS  . insulin aspart  0-9 Units Subcutaneous TID WC  . insulin glargine  6 Units Subcutaneous Daily  . ipratropium-albuterol  3 mL Nebulization Q6H  . melatonin  2.5 mg Oral QHS  . multivitamin  1 tablet Oral Daily  . nicotine  7 mg Transdermal Daily  . pantoprazole (PROTONIX) IV  40 mg Intravenous Q12H  . sertraline  100 mg Oral Daily  . thiamine  100 mg Oral Daily   acetaminophen **OR** acetaminophen, diphenhydrAMINE, ondansetron **OR** ondansetron  (ZOFRAN) IV  Assessment/ Plan:  59 y.o. male with  was admitted on 12/24/2019 for  Active Problems:   Iron deficiency anemia   Uncontrolled diabetes mellitus ([place])   CVA (cerebral vascular accident) ([place])   Type 2 diabetes mellitus without complication, with long-term current use of insulin ([place])   ESRD (end stage renal disease) ([place])   Anemia  Cough [R05] Weakness [R53.1] Anemia [D64.9] Symptomatic anemia [D64.9] Diarrhea, unspecified type [R19.7]  [facility] /TuThSa-1// TW//61.5kg  #. ESRD Patient seen during dialysis Tolerating well Goal UF 2000 cc as tolerated Patient states his breathing is at baseline  #. Anemia of CKD  Lab Results  Component Value Date   HGB 8.4 (L) 12/28/2019   Low dose EPO with HD  #. Secondary hyperparathyroidism of renal origin N 25.81   No results found for: PTH Lab Results  Component Value Date   PHOS 6.6 (H) 12/26/2019   Monitor calcium and phos level during this admission   #. Diabetes type 2 with CKD Hemoglobin A1C (%)  Date Value  01/17/2014 < 3.5 (L)   Hgb A1c MFr Bld (%)  Date Value  12/10/2019 7.4 (H)    # Anemia from blood loss and Gi Bleed/melena Received blood transfusion this admission Upper endoscopy (12/25/19)- Normal  hgb trend appears to be stabilizing     LOS: [place] 8/16/202110:15 AM  [place], [place]

## 2019-12-28 NOTE — Care Management Important Message (Signed)
Important Message  Patient Details  Name: [name] MRN: [id number] Date of Birth: [birth year]   Medicare Important Message Given:  Yes     Loann Quill 12/28/2019, 11:47 AM

## 2019-12-29 DIAGNOSIS — J9621 Acute and chronic respiratory failure with hypoxia: Secondary | ICD-10-CM

## 2019-12-29 DIAGNOSIS — J690 Pneumonitis due to inhalation of food and vomit: Secondary | ICD-10-CM

## 2019-12-29 LAB — CBC WITH DIFFERENTIAL/PLATELET
Abs Immature Granulocytes: 0.01 10*3/uL (ref 0.00–0.07)
Basophils Absolute: 0 10*3/uL (ref 0.0–0.1)
Basophils Relative: 1 %
Eosinophils Absolute: 0.1 10*3/uL (ref 0.0–0.5)
Eosinophils Relative: 2 %
HCT: 24 % — ABNORMAL LOW (ref 39.0–52.0)
Hemoglobin: 7.9 g/dL — ABNORMAL LOW (ref 13.0–17.0)
Immature Granulocytes: 0 %
Lymphocytes Relative: 19 %
Lymphs Abs: 1.1 10*3/uL (ref 0.7–4.0)
MCH: 30.7 pg (ref 26.0–34.0)
MCHC: 32.9 g/dL (ref 30.0–36.0)
MCV: 93.4 fL (ref 80.0–100.0)
Monocytes Absolute: 0.5 10*3/uL (ref 0.1–1.0)
Monocytes Relative: 9 %
Neutro Abs: 3.9 10*3/uL (ref 1.7–7.7)
Neutrophils Relative %: 69 %
Platelets: 219 10*3/uL (ref 150–400)
RBC: 2.57 MIL/uL — ABNORMAL LOW (ref 4.22–5.81)
RDW: 16.3 % — ABNORMAL HIGH (ref 11.5–15.5)
WBC: 5.7 10*3/uL (ref 4.0–10.5)
nRBC: 0 % (ref 0.0–0.2)

## 2019-12-29 LAB — BASIC METABOLIC PANEL
Anion gap: 12 (ref 5–15)
BUN: 21 mg/dL — ABNORMAL HIGH (ref 6–20)
CO2: 28 mmol/L (ref 22–32)
Calcium: 7.9 mg/dL — ABNORMAL LOW (ref 8.9–10.3)
Chloride: 95 mmol/L — ABNORMAL LOW (ref 98–111)
Creatinine, Ser: 3.11 mg/dL — ABNORMAL HIGH (ref 0.61–1.24)
GFR calc Af Amer: 24 mL/min — ABNORMAL LOW (ref >60)
GFR calc non Af Amer: 21 mL/min — ABNORMAL LOW (ref >60)
Glucose, Bld: 117 mg/dL — ABNORMAL HIGH (ref 70–99)
Potassium: 3.4 mmol/L — ABNORMAL LOW (ref 3.5–5.1)
Sodium: 135 mmol/L (ref 135–145)

## 2019-12-29 LAB — GLUCOSE, CAPILLARY
Glucose-Capillary: 81 mg/dL (ref 70–99)
Glucose-Capillary: 222 mg/dL — ABNORMAL HIGH (ref 70–99)
Glucose-Capillary: 250 mg/dL — ABNORMAL HIGH (ref 70–99)

## 2019-12-29 MED ORDER — EPOETIN ALFA 10000 UNIT/ML IJ SOLN
4000.0000 [IU] | INTRAMUSCULAR | Status: DC
  Administered 2019-12-31 – 2020-01-02 (×2): 4000 [IU] via INTRAVENOUS

## 2019-12-29 MED ORDER — INSULIN ASPART 100 UNIT/ML ~~LOC~~ SOLN
2.0000 [IU] | Freq: Three times a day (TID) | SUBCUTANEOUS | Status: DC
  Administered 2019-12-29 – 2019-12-30 (×3): 2 [IU] via SUBCUTANEOUS
  Filled 2019-12-29 (×3): qty 1

## 2019-12-29 MED ORDER — POLYSACCHARIDE IRON COMPLEX 150 MG PO CAPS
150.0000 mg | ORAL_CAPSULE | Freq: Every day | ORAL | Status: DC
  Administered 2019-12-29 – 2020-01-04 (×7): 150 mg via ORAL
  Filled 2019-12-29 (×8): qty 1

## 2019-12-29 NOTE — Evaluation (Signed)
Clinical/Bedside Swallow Evaluation Patient Details  Name: [name] MRN: [id number] Date of Birth: 08-04-1960  Today's Date: 12/29/2019 Time: SLP Start Time (ACUTE ONLY): 0815 SLP Stop Time (ACUTE ONLY): 0840 SLP Time Calculation (min) (ACUTE ONLY): 25 min  Past Medical History:  Past Medical History:  Diagnosis Date  . Chronic kidney disease   . Diabetes mellitus without complication ([place])   . ETOH abuse   . GERD (gastroesophageal reflux disease)   . Hyperlipidemia   . Hypertension   . Stroke [facility])    Past Surgical History:  Past Surgical History:  Procedure Laterality Date  . AMPUTATION TOE Left 12/11/2019   Procedure: AMPUTATION LEFT 2nd TOE;  Surgeon: Sharlotte Alamo, DPM;  Location: [facility] ORS;  Service: Podiatry;  Laterality: Left;  . AV FISTULA PLACEMENT Right 11/25/2019   Procedure: INSERTION OF ARTERIOVENOUS (AV) GORE-TEX GRAFT ARM;  Surgeon: Katha Cabal, MD;  Location: [facility] ORS;  Service: Vascular;  Laterality: Right;  . COLONOSCOPY WITH PROPOFOL N/A 03/06/2019   Procedure: COLONOSCOPY WITH PROPOFOL;  Surgeon: Lucilla Lame, MD;  Location: [facility] ENDOSCOPY;  Service: Endoscopy;  Laterality: N/A;  . DIALYSIS/PERMA CATHETER INSERTION N/A 04/10/2019   Procedure: DIALYSIS/PERMA CATHETER INSERTION;  Surgeon: Serafina Mitchell, MD;  Location: [facility] CV LAB;  Service: Cardiovascular;  Laterality: N/A;  . ESOPHAGOGASTRODUODENOSCOPY (EGD) WITH PROPOFOL N/A 07/01/2016   Procedure: ESOPHAGOGASTRODUODENOSCOPY (EGD) WITH PROPOFOL;  Surgeon: San Jetty, MD;  Location: [facility] ENDOSCOPY;  Service: General;  Laterality: N/A;  . ESOPHAGOGASTRODUODENOSCOPY (EGD) WITH PROPOFOL N/A 12/25/2019   Procedure: ESOPHAGOGASTRODUODENOSCOPY (EGD) WITH PROPOFOL;  Surgeon: Lin Landsman, MD;  Location: [facility] ENDOSCOPY;  Service: Gastroenterology;  Laterality: N/A;  . LOWER EXTREMITY ANGIOGRAPHY Left 12/10/2019   Procedure: Lower Extremity Angiography;  Surgeon: Algernon Huxley, MD;  Location: [facility] CV LAB;  Service: Cardiovascular;  Laterality: Left;   HPI:  [name] is a 59 y.o. male past medical history significant for DM c multiple hyperglycemia admissions, multiple CVA, HTN, ETOH abuse, 2nd toe amputation (12/11/19), ESRD on HD TTS, [name] 12/29/2019 - CT angiogram of the chest to rule out PE, consistent with bilateral lower lobe pneumonia. Currently on 5liters O2.    Assessment / Plan / Recommendation Clinical Impression  Pt was somnolent and mildly confused throughout bedside swallow evaluation. When consuming regular diet breakfast items (toast/bacon) pt with extended mastication and very prolonged oral phase. When textures reduced to dysphagia 3, pt with decreased mastication and more timely oral phase. When consuming thin liquids (~6 oz) via cup, pt had the appearance of a timely swallow, no vocal wetness post swallow and vitals remained at baseline. Although pt reports continued coughing with consumption (solids and thin liquids) he didn't exhibit any coughing throughout today's session. At this time, recommend downgrading diet to dysphagia 3 for energy conservation and easier mastication, continue thin liquids via cup and medicine whole with thin liquids or puree. ST will follow pt from a distance.  SLP Visit Diagnosis: Dysphagia, unspecified (R13.10)    Aspiration Risk  Mild aspiration risk    Diet Recommendation   dysphagia 3 with thin liquids via cup  Medication Administration: Whole meds with liquid (or with puree)    Other  Recommendations Oral Care Recommendations: Oral care BID   Follow up Recommendations None      Frequency and Duration min 2x/week  2 weeks  Prognosis Prognosis for Safe Diet Advancement:  (dysphagia 3 was previous;y recommended diet (06/2019)) Barriers to Reach Goals: Cognitive deficits;Severity of  deficits;Behavior;Motivation      Swallow Study   General Date of Onset: 12/25/19 HPI: [name] is a 59 y.o. male past medical history significant for DM c multiple hyperglycemia admissions, multiple CVA, HTN, ETOH abuse, 2nd toe amputation (12/11/19), ESRD on HD TTS, [name] 12/29/2019 - CT angiogram of the chest to rule out PE, consistent with bilateral lower lobe pneumonia. Currently on 5liters O2.  Type of Study: Bedside Swallow Evaluation Previous Swallow Assessment: 06/2019 - recommmended dysphagia 3 with thin liquids Diet Prior to this Study: Regular;Thin liquids Temperature Spikes Noted: Yes Respiratory Status: Nasal cannula (5liters) History of Recent Intubation: No Behavior/Cognition: Alert;Confused;Distractible Oral Cavity Assessment: Within Functional Limits Oral Care Completed by SLP: No Oral Cavity - Dentition: Poor condition;Missing dentition Vision: Functional for self-feeding Self-Feeding Abilities: Able to feed self Patient Positioning: Upright in bed Baseline Vocal Quality: Normal Volitional Cough: Strong Volitional Swallow: Able to elicit    Oral/Motor/Sensory Function Overall Oral Motor/Sensory Function: Within functional limits   Ice Chips Ice chips: Not tested   Thin Liquid Thin Liquid: Within functional limits Presentation: Cup;Self Fed    Nectar Thick Nectar Thick Liquid: Not tested   Honey Thick Honey Thick Liquid: Not tested   Puree Puree: Within functional limits Presentation: Self Fed;Spoon   Solid     Solid: Impaired Presentation: Self Fed Oral Phase Impairments: Impaired mastication Oral Phase Functional Implications: Impaired mastication;Prolonged oral transit     Shiniqua Groseclose B. Rutherford Nail M.S., [name], [place] Pathologist Rehabilitation Services Office [phone]  Tekia [place] 12/29/2019,5:18 PM

## 2019-12-29 NOTE — Evaluation (Signed)
Physical Therapy Evaluation Patient Details Name: [name] MRN: [id number] DOB: 25-Oct-1960 Today's Date: 12/29/2019   History of Present Illness  [name] is a 59 y.o. male past medical history significant for DM c multiple hyperglycemia admissions, multiple CVA, HTN, ETOH abuse, 2nd toe amputation (12/11/19), ESRD on HD TTS, [name]  Clinical Impression  Pt admitted with above diagnosis. Pt currently with functional limitations due to the deficits listed below (see "PT Problem List"). Upon entry, pt in bed, asleep. Pt remains lethargic once awake, prefers to defer PT at this time due to not feeling well, reports very tired. Pt reports he has been compliant with RW and postop shoe since DC to home last week but that household mobility has been difficult, denies working with any PT. Pt assisted with scoot up in bed as he has slid down considerably, requires minA. Pt received on 5L/min O2 (2L chronic), noted to be hypoxic at 83%, appears to improve with repositioning in bed, increase to 6L, cues for breathing. Functional mobility assessment demonstrates increased effort/time requirements, fair tolerance, and minimal need for physical assistance, whereas the patient performed these at a higher level of independence PTA. Pt is familiar to author from prior episodes, appears to be slightly weaker than baseline, also more somnolent. Pt will benefit from skilled PT intervention to increase independence and safety with basic mobility in preparation for discharge to the venue listed below.       Follow Up Recommendations Home health PT;Supervision for mobility/OOB    Equipment Recommendations  None recommended by PT    Recommendations for Other Services       Precautions / Restrictions Precautions Precautions: Fall Required Braces or Orthoses: Other Brace Other Brace: post op  shoe Restrictions Weight Bearing Restrictions: Yes LLE Weight Bearing: Weight bearing as tolerated LLE Partial Weight Bearing Percentage or Pounds: heel weight bearing in postopshoe      Mobility  Bed Mobility Overal bed mobility: Needs Assistance             General bed mobility comments: able to scoot up in bed with minA from a bridge position.  Transfers Overall transfer level:  (deferred, pt declines, noted to be hypoxic)               General transfer comment: Pt deferred  Ambulation/Gait                Stairs            Wheelchair Mobility    Modified Rankin (Stroke Patients Only)       Balance                                             Pertinent Vitals/Pain Pain Assessment: No/denies pain Faces Pain Scale: Hurts a little bit Pain Location: headache Pain Descriptors / Indicators: Dull;Discomfort Pain Intervention(s): Limited activity within patient's tolerance    Home Living Family/patient expects to be discharged to:: Private residence Living Arrangements: Spouse/significant other Available Help at Discharge: Family;Available PRN/intermittently Type of Home: House Home Access: Level entry     Home Layout: One level Home Equipment: Grab bars - tub/shower;Grab bars - toilet;Cane - single point;Walker - 2 wheels      Prior Function Level of Independence: Needs  assistance   Gait / Transfers Assistance Needed: Pt reports MIN A /CGA + RW for household mobility  ADL's / Homemaking Assistance Needed: Reports aid 5hrs/day who assists c bathing, LB dressing, and meal setup  Comments: Pt reports independent with all ADLS/IADLs, using a SPC for ambulation prior to initial admission ~ 1 month ago.     Hand Dominance   Dominant Hand: Left    Extremity/Trunk Assessment   Upper Extremity Assessment Upper Extremity Assessment: Generalized weakness (R (3/5 grossly) weaker than L (4-/5 grossly) - hx of stroke)     Lower Extremity Assessment Lower Extremity Assessment: Generalized weakness       Communication   Communication: No difficulties  Cognition Arousal/Alertness: Lethargic Behavior During Therapy: WFL for tasks assessed/performed Overall Cognitive Status: No family/caregiver present to determine baseline cognitive functioning                                 General Comments: Disinterested in activity this date. A&O x4 however pt demonstrated intermittent STM deficits       General Comments General comments (skin integrity, edema, etc.): Stable on 5L humidified [place]     Exercises Other Exercises Other Exercises: Pt educated re: OT role, d/c recs, importanc eof mobility for functional strengthening Other Exercises: Rolling, bed mobility, self-drinking   Assessment/Plan    PT Assessment Patient needs continued PT services  PT Problem List Decreased mobility;Decreased strength;Decreased activity tolerance;Decreased balance;Pain;Cardiopulmonary status limiting activity       PT Treatment Interventions Therapeutic activities;Gait training;Therapeutic exercise;Functional mobility training;Balance training;Patient/family education    PT Goals (Current goals can be found in the Care Plan section)  Acute Rehab PT Goals Patient Stated Goal: to return home PT Goal Formulation: With family Time For Goal Achievement: 01/12/20 Potential to Achieve Goals: Good    Frequency Min 2X/week   Barriers to discharge Decreased caregiver support      Co-evaluation               AM-PAC PT "6 Clicks" Mobility  Outcome Measure Help needed turning from your back to your side while in a flat bed without using bedrails?: A Little Help needed moving from lying on your back to sitting on the side of a flat bed without using bedrails?: A Little Help needed moving to and from a bed to a chair (including a wheelchair)?: A Little Help needed standing up from a chair using your arms  (e.g., wheelchair or bedside chair)?: A Little Help needed to walk in hospital room?: A Lot Help needed climbing 3-5 steps with a railing? : A Lot 6 Click Score: 16    End of Session   Activity Tolerance: Treatment limited secondary to medical complications (Comment);Patient limited by fatigue;Patient limited by lethargy Patient left: in bed;with call bell/phone within reach;with bed alarm set Nurse Communication: Mobility status PT Visit Diagnosis: Unsteadiness on feet (R26.81);Difficulty in walking, not elsewhere classified (R26.2);Other abnormalities of gait and mobility (R26.89)    Time: 1300-1320 PT Time Calculation (min) (ACUTE ONLY): 20 min   Charges:   PT Evaluation $PT Eval Moderate Complexity: 1 Mod          3:37 PM, 12/29/19 Etta Grandchild, PT, DPT Physical Therapist - [facility]  [phone] ([place])   Sevastian Witczak C 12/29/2019, 3:33 PM

## 2019-12-29 NOTE — Progress Notes (Signed)
[facility], [place] 12/29/19  Subjective:   LOS: 4    Also noted to have low Hgb at admission,Hb 7.9 today No peripheral edema,SOB Patient states his breathing is at baseline   Objective:  Vital signs in last 24 hours:  Temp:  [98.2 F (36.8 C)-98.7 F (37.1 C)] 98.7 F (37.1 C) (08/17 0358) Pulse Rate:  [82-95] 95 (08/17 0358) Resp:  [17-18] 17 (08/17 0358) BP: (116-150)/(71-85) 116/72 (08/17 0358) SpO2:  [91 %-98 %] 98 % (08/17 0358)  Weight change:  Filed Weights   12/24/19 1208 12/25/19 1328  Weight: 61.2 kg 62.5 kg    Intake/Output:    Intake/Output Summary (Last 24 hours) at 12/29/2019 1259 Last data filed at 12/29/2019 0900 Gross per 24 hour  Intake 797.49 ml  Output --  Net 797.49 ml   Physical Exam: General:  No acute distress, resting in bed  HEENT Oral mucosa moist  Pulm/lungs  normal breathing effort, lungs : CTABL  CVS/Heart  RRR, no rub or gallop  Abdomen:   Soft, nontender  Extremities:  No peripheral edema  Neurologic:  Alert, oriented  Skin:  No rashes,warm,no signs of dehydration  Rt IJ PC  Basic Metabolic Panel:  Recent Labs  Lab 12/24/19 1229 12/25/19 1320 12/26/19 0938 12/28/19 0621 12/29/19 0430  NA 140 142 137  --  135  K 3.4* 3.5 4.1  --  3.4*  CL 97* 101 98  --  95*  CO2 32  --  26  --  28  GLUCOSE 277* 77 492*  --  117*  BUN 20 26* 34*  --  21*  CREATININE 2.54* 3.50* 4.56* 4.27* 3.11*  CALCIUM 7.9*  --  8.0*  --  7.9*  PHOS  --   --  6.6*  --   --      CBC: Recent Labs  Lab 12/24/19 1229 12/25/19 0216 12/25/19 1320 12/25/19 1320 12/26/19 0938 12/26/19 1345 12/27/19 0503 12/28/19 0621 12/29/19 0430  WBC 4.9  --   --   --  5.7  --  4.7 6.1 5.7  NEUTROABS  --   --   --   --   --   --  2.8 3.9 3.9  HGB 6.6*   < > 8.5*   < > 8.0* 7.7* 7.9* 8.4* 7.9*  HCT 20.8*   < > 25.0*  --  24.9*  --  24.2* 25.5* 24.0*  MCV 95.9  --   --   --  95.0  --  93.1 93.1 93.4  PLT 257  --   --   --   272  --  244 246 219   < > = values in this interval not displayed.      Lab Results  Component Value Date   HEPBSAG NON REACTIVE 12/06/2019   HEPBSAB NON REACTIVE 04/10/2019      Microbiology:  Recent Results (from the past 240 hour(s))  Gastrointestinal Panel by PCR , Stool     Status: None   Collection Time: 12/24/19  5:12 PM   Specimen: Stool  Result Value Ref Range Status   Campylobacter species NOT DETECTED NOT DETECTED Final   Plesimonas shigelloides NOT DETECTED NOT DETECTED Final   Salmonella species NOT DETECTED NOT DETECTED Final   Yersinia enterocolitica NOT DETECTED NOT DETECTED Final   Vibrio species NOT DETECTED NOT DETECTED Final   Vibrio cholerae NOT DETECTED NOT DETECTED Final   Enteroaggregative E coli (EAEC) NOT DETECTED NOT DETECTED  Final   Enteropathogenic E coli (EPEC) NOT DETECTED NOT DETECTED Final   Enterotoxigenic E coli (ETEC) NOT DETECTED NOT DETECTED Final   Shiga like toxin producing E coli (STEC) NOT DETECTED NOT DETECTED Final   Shigella/Enteroinvasive E coli (EIEC) NOT DETECTED NOT DETECTED Final   Cryptosporidium NOT DETECTED NOT DETECTED Final   Cyclospora cayetanensis NOT DETECTED NOT DETECTED Final   Entamoeba histolytica NOT DETECTED NOT DETECTED Final   Giardia lamblia NOT DETECTED NOT DETECTED Final   Adenovirus F40/41 NOT DETECTED NOT DETECTED Final   Astrovirus NOT DETECTED NOT DETECTED Final   Norovirus GI/GII NOT DETECTED NOT DETECTED Final   Rotavirus A NOT DETECTED NOT DETECTED Final   Sapovirus (I, II, IV, and V) NOT DETECTED NOT DETECTED Final    Comment: Performed at [facility], [address] [place], [place] [address]  C Difficile Quick Screen w PCR reflex     Status: None   Collection Time: 12/24/19  5:12 PM   Specimen: STOOL  Result Value Ref Range Status   C Diff antigen NEGATIVE NEGATIVE Final   C Diff toxin NEGATIVE NEGATIVE Final   C Diff interpretation No C. difficile detected.  Final    Comment:  Performed at [facility], [address] [place], [place] [address]  SARS Coronavirus 2 by RT PCR (hospital order, performed in [facility] hospital lab) Nasopharyngeal Nasopharyngeal Swab     Status: None   Collection Time: 12/24/19  5:12 PM   Specimen: Nasopharyngeal Swab  Result Value Ref Range Status   SARS Coronavirus 2 NEGATIVE NEGATIVE Final    Comment: (NOTE) SARS-CoV-2 target nucleic acids are NOT DETECTED.  The SARS-CoV-2 RNA is generally detectable in upper and lower respiratory specimens during the acute phase of infection. The lowest concentration of SARS-CoV-2 viral copies this assay can detect is 250 copies / mL. A negative result does not preclude SARS-CoV-2 infection and should not be used as the sole basis for treatment or other patient management decisions.  A negative result may occur with improper specimen collection / handling, submission of specimen other than nasopharyngeal swab, presence of viral mutation(s) within the areas targeted by this assay, and inadequate number of viral copies (<250 copies / mL). A negative result must be combined with clinical observations, patient history, and epidemiological information.  Fact Sheet for Patients:   StrictlyIdeas.no  Fact Sheet for Healthcare Providers: BankingDealers.co.za  This test is not yet approved or  cleared by the [place] FDA and has been authorized for detection and/or diagnosis of SARS-CoV-2 by FDA under an Emergency Use Authorization (EUA).  This EUA will remain in effect (meaning this test can be used) for the duration of the COVID-19 declaration under Section 564(b)(1) of the Act, 21 U.S.C. section 360bbb-3(b)(1), unless the authorization is terminated or revoked sooner.  Performed at [facility], [address] [place], [place] [address]     Coagulation Studies: No results for input(s): LABPROT, INR in the last 72  hours.  Urinalysis: No results for input(s): COLORURINE, LABSPEC, PHURINE, GLUCOSEU, HGBUR, BILIRUBINUR, KETONESUR, PROTEINUR, UROBILINOGEN, NITRITE, LEUKOCYTESUR in the last 72 hours.  Invalid input(s): APPERANCEUR    Imaging: CT ANGIO CHEST PE W OR WO CONTRAST  Result Date: 12/28/2019 CLINICAL DATA:  59 year old male with concern for pulmonary embolism. EXAM: CT ANGIOGRAPHY CHEST WITH CONTRAST TECHNIQUE: Multidetector CT imaging of the chest was performed using the standard protocol during bolus administration of intravenous contrast. Multiplanar CT image reconstructions and MIPs were  obtained to evaluate the vascular anatomy. CONTRAST:  16mL OMNIPAQUE IOHEXOL 350 MG/ML SOLN COMPARISON:  Chest CT dated 12/06/2019. FINDINGS: Evaluation of this exam is limited due to respiratory motion artifact. Cardiovascular: There is moderate cardiomegaly. Small pericardial effusion measuring 11 mm in thickness anterior to the heart. There is coronary vascular calcification primarily involving the LAD and left coronary artery. There is mild atherosclerotic calcification of the thoracic aorta. No aneurysmal dilatation. There is dilatation the main pulmonary trunk suggestive of a degree of pulmonary hypertension. Evaluation of the pulmonary arteries is limited due to respiratory motion artifact. No pulmonary artery embolus identified. Mediastinum/Nodes: Mildly enlarged right hilar lymph node measured 2 cm. Additional mediastinal adenopathy noted but suboptimally visualized. There is nodular thickening of the anterior mediastinal border in the prevascular space (83/5), likely related to underlying adenopathy. The esophagus is poorly visualized. A right IJ catheter with tip at the cavoatrial junction noted. Lungs/Pleura: Moderate bilateral pleural effusions with associated partial compressive atelectasis of the lower lobes. Pneumonia is not excluded. Clinical correlation is recommended. There is diffuse interstitial and  interlobular septal prominence consistent with edema. There is no pneumothorax. Secretions noted within the trachea. The central airways are patent. Upper Abdomen: Diffuse pancreatic calcification consistent with chronic pancreatitis. Musculoskeletal: No chest wall abnormality. No acute or significant osseous findings. Review of the MIP images confirms the above findings. IMPRESSION: 1. No CT evidence of pulmonary artery embolus. 2. Moderate cardiomegaly with findings of CHF and moderate bilateral pleural effusions. 3. Bilateral lower lobe partial compressive atelectasis. Pneumonia is not excluded. Clinical correlation is recommended. 4. Aortic Atherosclerosis (ICD10-I70.0). Electronically Signed   By: Anner Crete M.D.   On: 12/28/2019 17:21     Medications:   . ampicillin-sulbactam (UNASYN) IV Stopped (12/29/19 0527)   . aspirin EC  81 mg Oral Daily  . carvedilol  6.25 mg Oral BID WC  . Chlorhexidine Gluconate Cloth  6 each Topical Daily  . feeding supplement (GLUCERNA SHAKE)  237 mL Oral BID BM  . folic acid  1 mg Oral Daily  . heparin  5,000 Units Subcutaneous Q8H  . hydrALAZINE  25 mg Oral BID  . insulin aspart  0-5 Units Subcutaneous QHS  . insulin aspart  0-9 Units Subcutaneous TID WC  . insulin glargine  6 Units Subcutaneous Daily  . iron polysaccharides  150 mg Oral Daily  . melatonin  2.5 mg Oral QHS  . multivitamin  1 tablet Oral Daily  . nicotine  7 mg Transdermal Daily  . pantoprazole (PROTONIX) IV  40 mg Intravenous Q12H  . sertraline  100 mg Oral Daily  . thiamine  100 mg Oral Daily   acetaminophen **OR** acetaminophen, diphenhydrAMINE, ipratropium-albuterol, ondansetron **OR** ondansetron (ZOFRAN) IV  Assessment/ Plan:  59 y.o. male with  was admitted on 12/24/2019 for  Active Problems:   Iron deficiency anemia   Uncontrolled diabetes mellitus ([place])   CVA (cerebral vascular accident) ([place])   Type 2 diabetes mellitus without complication, with long-term current  use of insulin ([place])   ESRD (end stage renal disease) ([place])   Anemia   Acute on chronic respiratory failure with hypoxemia ([facility])   Aspiration pneumonia of both lower lobes due to gastric secretions ([facility])  Cough [R05] Weakness [R53.1] Anemia [D64.9] Symptomatic anemia [D64.9] Diarrhea, unspecified type [R19.7]  [facility] /TuThSa-1// TW//61.5kg  #. ESRD Next dialysis on Thursday Electrolytes and Volume status are acceptable No acute indication for Dialysis at present Underwent HD yesterday, 2000 cc was removed CT angiogram  of chest 8/16 - moderate cardiomegaly and pleural effusions Still requiring 5 L O2  #. Anemia of CKD  Lab Results  Component Value Date   HGB 7.9 (L) 12/29/2019   Low dose EPO with HD  #. Secondary hyperparathyroidism of renal origin N 25.81   No results found for: PTH Lab Results  Component Value Date   PHOS 6.6 (H) 12/26/2019   Monitor calcium and phos level during this admission   #. Diabetes type 2 with CKD Hemoglobin A1C (%)  Date Value  01/17/2014 < 3.5 (L)   Hgb A1c MFr Bld (%)  Date Value  12/10/2019 7.4 (H)    # Anemia from blood loss and Gi Bleed/melena Received blood transfusion this admission Upper endoscopy (12/25/19)- Normal  Hgb 7.9      LOS: [place] 8/17/202112:59 PM  [place], [place]

## 2019-12-29 NOTE — Progress Notes (Addendum)
Inpatient Diabetes Program Recommendations  AACE/ADA: New Consensus Statement on Inpatient Glycemic Control (2015)  Target Ranges:  Prepandial:   less than 140 mg/dL      [place] postprandial:   less than 180 mg/dL (1-2 hours)      Critically ill patients:  140 - 180 mg/dL   Lab Results  Component Value Date   GLUCAP 222 (H) 12/29/2019   HGBA1C 7.4 (H) 12/10/2019    Review of Glycemic Control Results for SABIN, GIBEAULT (MRN [id number]) as of 12/29/2019 15:51  Ref. Range 12/29/2019 07:42 12/29/2019 11:53  Glucose-Capillary Latest Ref Range: 70 - 99 mg/dL 81 222 (H)  Home DM Meds: Lantus 20 units Daily                              Humalog 0-15 units TID per SSI                             (per ENDO notes: Lantus 10 units QHS and Humalog 2-6 units BID with Lunch and Dinner only)    Current Orders: Lantus 6 units daily                           Novolog Sensitive Correction Scale/ SSI (0-9 units) TID AC + HS Inpatient Diabetes Program Recommendations:    May consider adding Novolog 2 units tid with meals (hold if patient eats less than 50% or NPO).   Thanks,  Adah Perl, RN, BC-ADM Inpatient Diabetes Coordinator Pager [phone] (8a-5p)

## 2019-12-29 NOTE — Progress Notes (Signed)
Hemodialysis patient known at [place] 5:15am. Patient stated that he rides [employer] Tuesday and Thursday and catches a ride on with someone on Saturdays. Please contact me with dialysis placement concerns.  Elvera Bicker Dialysis Coordinator [phone]

## 2019-12-29 NOTE — Evaluation (Signed)
Occupational Therapy Evaluation Patient Details Name: [name] MRN: [id number] DOB: [birth year] Today's Date: 12/29/2019    History of Present Illness [name] is a 59 y.o. male past medical history significant for diabetes, stroke, hypertension, history of alcohol use, 2nd toe amputation (12/11/19), [name], Thursday and Saturday, [name]   Clinical Impression   Mr Mormile was seen for OT evaluation this date. Prior to hospital admission, pt required MIN A-CGA + RW for household mobility, reports use of personal aid 5hrs/day who assists c bathing, LB dressing, transfers, and meal setup. Pt lives c spouse who works. Pt presents to acute OT demonstrating impaired ADL performance and functional mobility 2/2 decreased activity tolerance, functional strength/ROM deficits, and short term memory deficits. Pt currently requires SETUP self-drinking at bed level. MOD I for toilet t/f at bed level - anticipate assist for perihygiene. Pt deferred further mobility stating fatigue. Pt would benefit from skilled OT to address noted impairments and functional limitations (see below for any additional details) in order to maximize safety and independence while minimizing falls risk and caregiver burden. Upon hospital discharge, recommend HHOT to maximize pt safety and return to functional independence during meaningful occupations of daily life.     Follow Up Recommendations  Home health OT    Equipment Recommendations  3 in 1 bedside commode    Recommendations for Other Services       Precautions / Restrictions Precautions Precautions: Fall Restrictions Weight Bearing Restrictions: Yes LLE Weight Bearing: Weight bearing as tolerated      Mobility Bed Mobility Overal bed mobility: Modified Independent   General bed mobility comments: MOD  I for rolling and scooting higher in bed. Pt deferred further mobility testing.   Transfers General transfer comment: Pt deferred     ADL either performed or assessed with clinical judgement   ADL Overall ADL's : Needs assistance/impaired      General ADL Comments: SETUP self-drinking at bed level. MOD I for toilet t/f at bed level - anticipate assist for perihygiene.       Pertinent Vitals/Pain Pain Assessment: Faces Faces Pain Scale: Hurts a little bit Pain Location: headache Pain Descriptors / Indicators: Dull;Discomfort Pain Intervention(s): Limited activity within patient's tolerance     Hand Dominance Left   Extremity/Trunk Assessment Upper Extremity Assessment Upper Extremity Assessment: Generalized weakness (R (3/5 grossly) weaker than L (4-/5 grossly) - hx of stroke)   Lower Extremity Assessment Lower Extremity Assessment: Generalized weakness       Communication Communication Communication: No difficulties   Cognition Arousal/Alertness: Awake/alert Behavior During Therapy: Flat affect Overall Cognitive Status: No family/caregiver present to determine baseline cognitive functioning       General Comments: Disinterested in activity this date. A&O x4 however pt demonstrated intermittent STM deficits    General Comments  Stable on 5L humidified [place]     Exercises Exercises: Other exercises Other Exercises Other Exercises: Pt educated re: OT role, d/c recs, importanc eof mobility for functional strengthening Other Exercises: Rolling, bed mobility, self-drinking   Shoulder Instructions      Home Living Family/patient expects to be discharged to:: Private residence Living Arrangements: Spouse/significant other Available Help at Discharge: Family;Available PRN/intermittently Type of Home: House Home Access: Level entry     Home Layout: One level     Bathroom Shower/Tub: Occupational psychologist: Standard  Home Equipment: Grab bars -  tub/shower;Grab bars - toilet;Cane - single point;Walker - 2 wheels          Prior Functioning/Environment Level of Independence: Needs assistance  Gait / Transfers Assistance Needed: Pt reports MIN A /CGA + RW for household mobility ADL's / Homemaking Assistance Needed: Reports aid 5hrs/day who assists c bathing, LB dressing, and meal setup            OT Problem List: Decreased strength;Decreased range of motion;Decreased activity tolerance;Decreased safety awareness;Impaired UE functional use      OT Treatment/Interventions: Self-care/ADL training;Therapeutic exercise;Energy conservation;DME and/or AE instruction;Therapeutic activities;Patient/family education;Balance training    OT Goals(Current goals can be found in the care plan section) Acute Rehab OT Goals Patient Stated Goal: to return home OT Goal Formulation: With patient Time For Goal Achievement: 01/12/20 Potential to Achieve Goals: Good ADL Goals Pt Will Perform Eating: with set-up;sitting Pt Will Perform Upper Body Bathing: with set-up;with supervision;sitting Pt Will Transfer to Toilet: with min assist;ambulating;bedside commode (c LRAD PRN)  OT Frequency: Min 1X/week   Barriers to D/C: Inaccessible home environment;Decreased caregiver support          AM-PAC OT "6 Clicks" Daily Activity     Outcome Measure Help from another person eating meals?: None Help from another person taking care of personal grooming?: A Little Help from another person toileting, which includes using toliet, bedpan, or urinal?: A Lot Help from another person bathing (including washing, rinsing, drying)?: A Lot Help from another person to put on and taking off regular upper body clothing?: A Little Help from another person to put on and taking off regular lower body clothing?: None 6 Click Score: 18   End of Session Equipment Utilized During Treatment: Oxygen (5L [place])  Activity Tolerance: Patient limited by fatigue Patient left:  in bed;with call bell/phone within reach;with bed alarm set;with nursing/sitter in room (NT in room at end of session)  OT Visit Diagnosis: Other abnormalities of gait and mobility (R26.89);Muscle weakness (generalized) (M62.81)                Time: 1410-3013 OT Time Calculation (min): 17 min Charges:  OT General Charges $OT Visit: 1 Visit OT Evaluation $OT Eval Low Complexity: 1 Low OT Treatments $Self Care/Home Management : 8-22 mins  Dessie Coma, M.S. OTR/L  12/29/19, 2:03 PM  ascom [phone]

## 2019-12-29 NOTE — TOC Initial Note (Addendum)
Transition of Care [facility]) - Initial/Assessment Note    Patient Details  Name: [name] MRN: [id number] Date of Birth: 1960-12-02  Transition of Care [facility] CM/SW Contact:    Candie Chroman, LCSW Phone Number: 12/29/2019, 10:10 AM  Clinical Narrative: Readmission prevention screen complete. CSW met with patient. No supports at bedside. CSW introduced role and explained that discharge planning would be discussed. Patient's PCP is Dr. Ginette Pitman. Patient reports using public transportation to get to appointments. Per last readmission screen, [facility] takes patient to HD on Tuesday and Thursdays and wife transport him on Saturday. Pharmacy is [facility] in [place]. No issues obtaining medications. Patient did not have home health prior to admission but states he needs it. MD has entered PT and OT orders. Patient has a walker and cane that he uses at home. He is on 2 L chronic oxygen at home. He is unsure the name of the company that provides it. No further concerns. CSW encouraged patient to contact CSW as needed. CSW will continue to follow patient for support and facilitate return home when stable.     3:48 pm: PT and OT are recommending home health. [facility] be able to accept. [facility] and [facility] are unable to accept. [facility] might be able to accept for PT only. Left messages for [facility], [facility], and [facility].         4:19 pm: [facility] and [facility] are unable to accept. [facility] does not have nursing in [place] right now.       Expected Discharge Plan: [place] Barriers to Discharge: Continued Medical Work up   Patient Goals and CMS Choice        Expected Discharge Plan and Services Expected Discharge Plan: [place] Acute Care Choice: Shelly arrangements for the past 2 months: Single Family Home                                      Prior Living Arrangements/Services Living arrangements  for the past 2 months: Single Family Home Lives with:: Spouse Patient language and need for interpreter reviewed:: Yes Do you feel safe going back to the place where you live?: Yes      Need for Family Participation in Patient Care: Yes (Comment) Care giver support system in place?: Yes (comment) Current home services: DME Criminal Activity/Legal Involvement Pertinent to Current Situation/Hospitalization: No - Comment as needed  Activities of Daily Living Home Assistive Devices/Equipment: Dentures (specify type), Walker (specify type) ADL Screening (condition at time of admission) Patient's cognitive ability adequate to safely complete daily activities?: Yes Is the patient deaf or have difficulty hearing?: No Does the patient have difficulty seeing, even when wearing glasses/contacts?: No Does the patient have difficulty concentrating, remembering, or making decisions?: No Patient able to express need for assistance with ADLs?: Yes Does the patient have difficulty dressing or bathing?: Yes Independently performs ADLs?: No Communication: Independent Dressing (OT): Needs assistance Grooming: Needs assistance Feeding: Independent Bathing: Needs assistance Toileting: Needs assistance In/Out Bed: Needs assistance Walks in Home: Needs assistance Does the patient have difficulty walking or climbing stairs?: Yes Weakness of Legs: Both Weakness of Arms/Hands: None  Permission Sought/Granted                  Emotional Assessment Appearance:: Appears stated age Attitude/Demeanor/Rapport: Engaged Affect (typically observed): Flat Orientation: :  Oriented to Self, Oriented to Place, Oriented to  Time, Oriented to Situation Alcohol / Substance Use: Not Applicable Psych Involvement: No (comment)  Admission diagnosis:  Cough [R05] Weakness [R53.1] Anemia [D64.9] Symptomatic anemia [D64.9] Diarrhea, unspecified type [R19.7] Patient Active Problem List   Diagnosis Date Noted  .  Acute on chronic respiratory failure with hypoxemia ([place]) 12/29/2019  . Aspiration pneumonia of both lower lobes due to gastric secretions ([place]) 12/29/2019  . Diabetic foot infection ([place]) 12/07/2019  . Acute pulmonary edema ([place]) 12/07/2019  . Paronychia of second toe of left foot 11/09/2019  . Chronic osteomyelitis of toe of left foot ([place]) 11/09/2019  . Uncontrolled type 2 diabetes mellitus with hyperosmolar nonketotic hyperglycemia ([place]) 09/16/2019  . Hypoglycemia associated with diabetes ([place]) 08/24/2019  . Acute respiratory failure with hypoxia ([place])   . Acute metabolic encephalopathy 63/89/3734  . Hypertensive emergency 07/06/2019  . Acute encephalopathy 07/06/2019  . Seizures due to metabolic disorder ([place]) 28/76/8115  . Hyperosmolar hyperglycemic state (HHS) ([place]) 06/05/2019  . Anemia 06/04/2019  . Frequent falls 06/04/2019  . History of CVA (cerebrovascular accident) 06/04/2019  . HCAP (healthcare-associated pneumonia) 06/04/2019  . Acute blood loss anemia 06/04/2019  . Hyperglycemia 05/28/2019  . Fall at home, initial encounter 05/28/2019  . Rib fracture 05/28/2019  . Depression 05/28/2019  . Hypokalemia 05/28/2019  . Weakness 05/27/2019  . ESRD (end stage renal disease) ([place])   . Acute renal failure (ARF) ([place]) 04/09/2019  . Polyp of ascending colon   . Diarrhea   . AKI (acute kidney injury) ([place]) 03/04/2019  . Acute kidney injury ([place]) 07/26/2018  . ARF (acute renal failure) ([place]) 07/25/2018  . Bilateral leg numbness 03/19/2018  . Numbness and tingling of both feet 03/19/2018  . CVA (cerebral vascular accident) ([place]) 03/17/2018  . Moderate episode of recurrent major depressive disorder ([place]) 03/11/2018  . UTI (urinary tract infection) 02/27/2018  . Hypoglycemia 01/15/2018  . Type 2 diabetes mellitus without complication, with long-term current use of insulin ([place]) 08/27/2017  . Protein-calorie malnutrition, severe 08/19/2017  . Pancreatitis, acute 08/16/2017  .  DKA (diabetic ketoacidoses) ([place]) 08/16/2017  . HTN (hypertension) 08/16/2017  . HLD (hyperlipidemia) 08/16/2017  . Carotid stenosis 08/02/2016  . GERD (gastroesophageal reflux disease) 08/02/2016  . History of esophagogastroduodenoscopy (EGD) 07/01/2016  . History of recent blood transfusion 07/01/2016  . Acute renal failure superimposed on stage 4 chronic kidney disease ([place]) 07/01/2016  . Hyponatremia 07/01/2016  . Uncontrolled diabetes mellitus ([place]) 07/01/2016  . Alcohol abuse 07/01/2016  . Monilial esophagitis ([place]) 07/01/2016  . Tobacco abuse 07/01/2016  . Confusion 06/29/2016  . Iron deficiency anemia 06/29/2016  . Coagulopathy ([place]) 06/29/2016   PCP:  [name], MD Pharmacy:   [facility] [phone] - [address] [place] [address] Phone: [phone] Fax: [phone]     Social Determinants of Health (SDOH) Interventions    Readmission Risk Interventions Readmission Risk Prevention Plan 12/29/2019 12/09/2019 07/07/2019  Transportation Screening Complete Complete Complete  PCP or Specialist Appt within 3-5 Days - - Complete  HRI or Home Care Consult - - Complete  Palliative Care Screening - - -  Medication Review (RN Care Manager) Complete Complete Complete  PCP or Specialist appointment within 3-5 days of discharge Complete Complete -  [place] or Home Care Consult Complete Complete -  SW Recovery Care/Counseling Consult Complete Complete -  Palliative Care Screening Not Applicable Not  Applicable -  [place] Not Applicable Not Applicable -  Some recent data might be hidden

## 2019-12-29 NOTE — Progress Notes (Addendum)
PROGRESS NOTE    [name]  OKH:[id number] DOB: 1960-07-01 DOA: 12/24/2019 PCP: [name], MD   Chief complaint.  Shortness of breath.  Brief Narrative: [name] a 59 y.o.malepast medical history significant for diabetes, stroke, hypertension, history of alcohol use, [name],Thursday and Saturday,[name] Hemoglobin was at 6. Patient reports a history of shortness of breath on exertion for the last 3 to 4 months. He also reported history of episode ofblack stool a week prior to admission.  8/14.Hemoglobin continued drop to 7.7. We will keep patient for 1 more day .  EGD performed on 8/13 did not show any abnormality or active bleeding.  8/15.Patient developed significant bronchospasm overnight,was placed on high flow oxygen. Treated with DuoNeb for bronchospasm. Procalcitonin level not significantly elevated.   Scheduled for extra hemodialysis tomorrow.  8/16.  Patient still has significant hypoxemia after hemodialysis.  Consider aspiration pneumonia.  Start Unasyn.  Start prophylactic heparin.  8/17.  CT angiogram of the chest to rule out PE, consistent with bilateral lower lobe pneumonia.  Probable aspiration pneumonia, obtain speech therapy evaluation, continue Unasyn.  Still on 5 L oxygen today.    Assessment & Plan:   Active Problems:   Iron deficiency anemia   Uncontrolled diabetes mellitus (HCC)   CVA (cerebral vascular accident) ([place])   Type 2 diabetes mellitus without complication, with long-term current use of insulin (HCC)   ESRD (end stage renal disease) (HCC)   Anemia  #1.  Acute on chronic hypoxemic respiratory failure. Initially thought was due to volume overload.  Procalcitonin level was not significant elevated.  Condition did not improve after dialysis.  CT angiogram ruled  out PE, consistent with aspiration pneumonia.  Patient was on 2 L oxygen before admission.  Continue oxygen treatment for now.  2.  Aspiration pneumonia at the bilateral lower lobes ruled in. Patient appeared to have some difficulty clear up upper airway.  Obtain speech therapy evaluation.  Continue Unasyn.  3.  Severe iron deficient anemia. Condition has been stable for the last few days.  Received IV iron, continue oral iron supplement. EGD did not show any active bleeding.  4.  End-stage renal disease. Followed by nephrology.  5.  Moderate protein calorie malnutrition.  Continue supplement.  6.  Uncontrolled type 2 diabetes with hyperglycemia with occasional hypoglycemia. On lower dose Lantus at 6 units and sliding scale insulin.  7.  Recent left second toe amputation. Evaluated by podiatry, no infection.    DVT prophylaxis:heparin Code Status:Full Family Communication:Spoke with wife on the phone. Disposition Plan:  Patient came from:  Anticipated d/c place:  Barriers to d/c OR conditions which need to be met to effect a safe d/c:   Consultants:  Nephrology  Procedures:HD  Antimicrobials:None     Subjective: Patient slept better last night without secondary short of breath.  However, he is still on 5 L oxygen, he also has been short of breath with exertion.  He appeared to have increased upper airway secretion. No fever or chills.  No abdominal pain, nausea vomiting or diarrhea.  Objective: Vitals:   12/28/19 1357 12/28/19 1520 12/28/19 1925 12/29/19 0358  BP: (!) 150/71  135/85 116/72  Pulse: 82  91 95  Resp: _0 Temp: 98.2 F (36.8 C)  98.4 F (36.9 C) 98.7 F (37.1 C)  TempSrc: Oral  Oral Oral  SpO2: 91% 91% 96% 98%  Weight:      Height:        Intake/Output Summary (Last 24 hours) at 12/29/2019 0834 Last data  filed at 12/29/2019 3267 Gross per 24 hour  Intake 677.49 ml  Output 2000 ml  Net -1322.51 ml   Filed Weights   12/24/19 1208 12/25/19 1328  Weight: 61.2 kg 62.5 kg    Examination:  General exam: Appears calm and comfortable  Respiratory system: Decreased breathing sounds without crackles or wheezes. Respiratory effort normal. Cardiovascular system: S1 & S2 heard, RRR. No JVD, murmurs, rubs, gallops or clicks. No pedal edema. Gastrointestinal system: Abdomen is nondistended, soft and nontender. No organomegaly or masses felt. Normal bowel sounds heard. Central nervous system: Alert and oriented. No focal neurological deficits. Extremities: Symmetric. Skin: No rashes, lesions or ulcers Psychiatry:Mood & affect appropriate.     Data Reviewed: I have personally reviewed following labs and imaging studies  CBC: Recent Labs  Lab 12/24/19 1229 12/25/19 0216 12/25/19 1320 12/25/19 1320 12/26/19 0938 12/26/19 1345 12/27/19 0503 12/28/19 0621 12/29/19 0430  WBC 4.9  --   --   --  5.7  --  4.7 6.1 5.7  NEUTROABS  --   --   --   --   --   --  2.8 3.9 3.9  HGB 6.6*   < > 8.5*   < > 8.0* 7.7* 7.9* 8.4* 7.9*  HCT 20.8*   < > 25.0*  --  24.9*  --  24.2* 25.5* 24.0*  MCV 95.9  --   --   --  95.0  --  93.1 93.1 93.4  PLT 257  --   --   --  272  --  244 246 219   < > = values in this interval not displayed.   Basic Metabolic Panel: Recent Labs  Lab 12/24/19 1229 12/25/19 1320 12/26/19 0938 12/28/19 0621 12/29/19 0430  NA 140 142 137  --  135  K 3.4* 3.5 4.1  --  3.4*  CL 97* 101 98  --  95*  CO2 32  --  26  --  28  GLUCOSE 277* 77 492*  --  117*  BUN 20 26* 34*  --  21*  CREATININE 2.54* 3.50* 4.56* 4.27* 3.11*  CALCIUM 7.9*  --  8.0*  --  7.9*  PHOS  --   --  6.6*  --   --    GFR: Estimated Creatinine Clearance: 22.3 mL/min (A) (by C-G formula based on SCr of 3.11 mg/dL (H)). Liver Function Tests: Recent Labs  Lab 12/26/19 0938  AST 43*  ALT 30  ALKPHOS 178*   BILITOT 0.7  PROT 6.1*  ALBUMIN 2.5*  2.5*   No results for input(s): LIPASE, AMYLASE in the last 168 hours. No results for input(s): AMMONIA in the last 168 hours. Coagulation Profile: No results for input(s): INR, PROTIME in the last 168 hours. Cardiac Enzymes: No results for input(s): CKTOTAL, CKMB, CKMBINDEX, TROPONINI in the last 168 hours. BNP (last 3 results) No results for input(s): PROBNP in the last 8760 hours. HbA1C: No results for input(s): HGBA1C in the last 72 hours. CBG: Recent Labs  Lab 12/27/19 2131 12/28/19 0746 12/28/19 1643 12/28/19 2045 12/29/19 0742  GLUCAP 292* 116* 354* 350* 81   Lipid Profile: No results for input(s): CHOL, HDL, LDLCALC, TRIG, CHOLHDL, LDLDIRECT in the last 72 hours. Thyroid Function Tests: No results for input(s): TSH, T4TOTAL, FREET4, T3FREE,  THYROIDAB in the last 72 hours. Anemia Panel: No results for input(s): VITAMINB12, FOLATE, FERRITIN, TIBC, IRON, RETICCTPCT in the last 72 hours. Sepsis Labs: Recent Labs  Lab 12/27/19 0503  PROCALCITON 0.48    Recent Results (from the past 240 hour(s))  Gastrointestinal Panel by PCR , Stool     Status: None   Collection Time: 12/24/19  5:12 PM   Specimen: Stool  Result Value Ref Range Status   Campylobacter species NOT DETECTED NOT DETECTED Final   Plesimonas shigelloides NOT DETECTED NOT DETECTED Final   Salmonella species NOT DETECTED NOT DETECTED Final   Yersinia enterocolitica NOT DETECTED NOT DETECTED Final   Vibrio species NOT DETECTED NOT DETECTED Final   Vibrio cholerae NOT DETECTED NOT DETECTED Final   Enteroaggregative E coli (EAEC) NOT DETECTED NOT DETECTED Final   Enteropathogenic E coli (EPEC) NOT DETECTED NOT DETECTED Final   Enterotoxigenic E coli (ETEC) NOT DETECTED NOT DETECTED Final   Shiga like toxin producing E coli (STEC) NOT DETECTED NOT DETECTED Final   Shigella/Enteroinvasive E coli (EIEC) NOT DETECTED NOT DETECTED Final   Cryptosporidium NOT DETECTED NOT  DETECTED Final   Cyclospora cayetanensis NOT DETECTED NOT DETECTED Final   Entamoeba histolytica NOT DETECTED NOT DETECTED Final   Giardia lamblia NOT DETECTED NOT DETECTED Final   Adenovirus F40/41 NOT DETECTED NOT DETECTED Final   Astrovirus NOT DETECTED NOT DETECTED Final   Norovirus GI/GII NOT DETECTED NOT DETECTED Final   Rotavirus A NOT DETECTED NOT DETECTED Final   Sapovirus (I, II, IV, and V) NOT DETECTED NOT DETECTED Final    Comment: Performed at [facility] [address] [place], [place] [address]  C Difficile Quick Screen w PCR reflex     Status: None   Collection Time: 12/24/19  5:12 PM   Specimen: STOOL  Result Value Ref Range Status   C Diff antigen NEGATIVE NEGATIVE Final   C Diff toxin NEGATIVE NEGATIVE Final   C Diff interpretation No C. difficile detected.  Final    Comment: Performed at [facility], [address] [place], [place] [address]  SARS Coronavirus 2 by RT PCR (hospital order, performed in [facility] hospital lab) Nasopharyngeal Nasopharyngeal Swab     Status: None   Collection Time: 12/24/19  5:12 PM   Specimen: Nasopharyngeal Swab  Result Value Ref Range Status   SARS Coronavirus 2 NEGATIVE NEGATIVE Final    Comment: (NOTE) SARS-CoV-2 target nucleic acids are NOT DETECTED.  The SARS-CoV-2 RNA is generally detectable in upper and lower respiratory specimens during the acute phase of infection. The lowest concentration of SARS-CoV-2 viral copies this assay can detect is 250 copies / mL. A negative result does not preclude SARS-CoV-2 infection and should not be used as the sole basis for treatment or other patient management decisions.  A negative result may occur with improper specimen collection / handling, submission of specimen other than nasopharyngeal swab, presence of viral mutation(s) within the areas targeted by this assay, and inadequate number of viral copies (<250 copies / mL). A negative result must be combined  with clinical observations, patient history, and epidemiological information.  Fact Sheet for Patients:   StrictlyIdeas.no  Fact Sheet for Healthcare Providers: BankingDealers.co.za  This test is not yet approved or  cleared by the [place] FDA and has been authorized for detection and/or diagnosis of SARS-CoV-2 by FDA under an Emergency Use Authorization (EUA).  This EUA will remain in effect (meaning this test can be used)  for the duration of the COVID-19 declaration under Section 564(b)(1) of the Act, 21 U.S.C. section 360bbb-3(b)(1), unless the authorization is terminated or revoked sooner.  Performed at [facility], [address], [place],  [address]          Radiology Studies: CT ANGIO CHEST PE W OR WO CONTRAST  Result Date: 12/28/2019 CLINICAL DATA:  59 year old male with concern for pulmonary embolism. EXAM: CT ANGIOGRAPHY CHEST WITH CONTRAST TECHNIQUE: Multidetector CT imaging of the chest was performed using the standard protocol during bolus administration of intravenous contrast. Multiplanar CT image reconstructions and MIPs were obtained to evaluate the vascular anatomy. CONTRAST:  61m OMNIPAQUE IOHEXOL 350 MG/ML SOLN COMPARISON:  Chest CT dated 12/06/2019. FINDINGS: Evaluation of this exam is limited due to respiratory motion artifact. Cardiovascular: There is moderate cardiomegaly. Small pericardial effusion measuring 11 mm in thickness anterior to the heart. There is coronary vascular calcification primarily involving the LAD and left coronary artery. There is mild atherosclerotic calcification of the thoracic aorta. No aneurysmal dilatation. There is dilatation the main pulmonary trunk suggestive of a degree of pulmonary hypertension. Evaluation of the pulmonary arteries is limited due to respiratory motion artifact. No pulmonary artery embolus identified. Mediastinum/Nodes: Mildly enlarged right  hilar lymph node measured 2 cm. Additional mediastinal adenopathy noted but suboptimally visualized. There is nodular thickening of the anterior mediastinal border in the prevascular space (83/5), likely related to underlying adenopathy. The esophagus is poorly visualized. A right IJ catheter with tip at the cavoatrial junction noted. Lungs/Pleura: Moderate bilateral pleural effusions with associated partial compressive atelectasis of the lower lobes. Pneumonia is not excluded. Clinical correlation is recommended. There is diffuse interstitial and interlobular septal prominence consistent with edema. There is no pneumothorax. Secretions noted within the trachea. The central airways are patent. Upper Abdomen: Diffuse pancreatic calcification consistent with chronic pancreatitis. Musculoskeletal: No chest wall abnormality. No acute or significant osseous findings. Review of the MIP images confirms the above findings. IMPRESSION: 1. No CT evidence of pulmonary artery embolus. 2. Moderate cardiomegaly with findings of CHF and moderate bilateral pleural effusions. 3. Bilateral lower lobe partial compressive atelectasis. Pneumonia is not excluded. Clinical correlation is recommended. 4. Aortic Atherosclerosis (ICD10-I70.0). Electronically Signed   By: AAnner CreteM.D.   On: 12/28/2019 17:21        Scheduled Meds: . aspirin EC  81 mg Oral Daily  . carvedilol  6.25 mg Oral BID WC  . Chlorhexidine Gluconate Cloth  6 each Topical Daily  . feeding supplement (GLUCERNA SHAKE)  237 mL Oral BID BM  . folic acid  1 mg Oral Daily  . heparin  5,000 Units Subcutaneous Q8H  . hydrALAZINE  25 mg Oral BID  . insulin aspart  0-5 Units Subcutaneous QHS  . insulin aspart  0-9 Units Subcutaneous TID WC  . insulin glargine  6 Units Subcutaneous Daily  . melatonin  2.5 mg Oral QHS  . multivitamin  1 tablet Oral Daily  . nicotine  7 mg Transdermal Daily  . pantoprazole (PROTONIX) IV  40 mg Intravenous Q12H  .  sertraline  100 mg Oral Daily  . thiamine  100 mg Oral Daily   Continuous Infusions: . ampicillin-sulbactam (UNASYN) IV Stopped (12/29/19 0527)     LOS: 4 days    Time spent: 28 minutes    DSharen Hones MD [facility]   To contact the attending provider between 7A-7P or the covering provider during after hours 7P-7A, please log into the web site [email or web address]  and access using universal [place] password for that web site. If you do not have the password, please call the hospital operator.  12/29/2019, 8:34 AM

## 2019-12-30 DIAGNOSIS — J690 Pneumonitis due to inhalation of food and vomit: Secondary | ICD-10-CM

## 2019-12-30 LAB — BASIC METABOLIC PANEL
Anion gap: 12 (ref 5–15)
BUN: 31 mg/dL — ABNORMAL HIGH (ref 6–20)
CO2: 28 mmol/L (ref 22–32)
Calcium: 8.3 mg/dL — ABNORMAL LOW (ref 8.9–10.3)
Chloride: 99 mmol/L (ref 98–111)
Creatinine, Ser: 4.42 mg/dL — ABNORMAL HIGH (ref 0.61–1.24)
GFR calc Af Amer: 16 mL/min — ABNORMAL LOW (ref >60)
GFR calc non Af Amer: 14 mL/min — ABNORMAL LOW (ref >60)
Glucose, Bld: 64 mg/dL — ABNORMAL LOW (ref 70–99)
Potassium: 4.1 mmol/L (ref 3.5–5.1)
Sodium: 139 mmol/L (ref 135–145)

## 2019-12-30 LAB — GLUCOSE, CAPILLARY
Glucose-Capillary: 62 mg/dL — ABNORMAL LOW (ref 70–99)
Glucose-Capillary: 200 mg/dL — ABNORMAL HIGH (ref 70–99)
Glucose-Capillary: 205 mg/dL — ABNORMAL HIGH (ref 70–99)
Glucose-Capillary: 59 mg/dL — ABNORMAL LOW (ref 70–99)
Glucose-Capillary: 107 mg/dL — ABNORMAL HIGH (ref 70–99)

## 2019-12-30 LAB — CBC WITH DIFFERENTIAL/PLATELET
Abs Immature Granulocytes: 0.02 10*3/uL (ref 0.00–0.07)
Basophils Absolute: 0 10*3/uL (ref 0.0–0.1)
Basophils Relative: 0 %
Eosinophils Absolute: 0.1 10*3/uL (ref 0.0–0.5)
Eosinophils Relative: 2 %
HCT: 25.1 % — ABNORMAL LOW (ref 39.0–52.0)
Hemoglobin: 7.8 g/dL — ABNORMAL LOW (ref 13.0–17.0)
Immature Granulocytes: 0 %
Lymphocytes Relative: 19 %
Lymphs Abs: 1 10*3/uL (ref 0.7–4.0)
MCH: 30.4 pg (ref 26.0–34.0)
MCHC: 31.1 g/dL (ref 30.0–36.0)
MCV: 97.7 fL (ref 80.0–100.0)
Monocytes Absolute: 0.5 10*3/uL (ref 0.1–1.0)
Monocytes Relative: 9 %
Neutro Abs: 3.7 10*3/uL (ref 1.7–7.7)
Neutrophils Relative %: 70 %
Platelets: 219 10*3/uL (ref 150–400)
RBC: 2.57 MIL/uL — ABNORMAL LOW (ref 4.22–5.81)
RDW: 16.5 % — ABNORMAL HIGH (ref 11.5–15.5)
WBC: 5.4 10*3/uL (ref 4.0–10.5)
nRBC: 0 % (ref 0.0–0.2)

## 2019-12-30 MED ORDER — SODIUM CHLORIDE 0.9 % IV SOLN
3.0000 g | INTRAVENOUS | Status: DC
  Administered 2019-12-30 – 2019-12-31 (×2): 3 g via INTRAVENOUS
  Filled 2019-12-30 (×2): qty 3
  Filled 2019-12-30: qty 8

## 2019-12-30 NOTE — Progress Notes (Signed)
  Speech Language Pathology Treatment: Dysphagia  Patient Details Name: [name] MRN: [id number] DOB: [birth year] Today's Date: 12/30/2019 Time: 1203-1220 SLP Time Calculation (min) (ACUTE ONLY): 17 min  Assessment / Plan / Recommendation Clinical Impression  Skilled treatment session targeted pt's ongoing dysphagia goals. Pt has not complained about dysphagia 3 food items. SLP facilitated session by providing skilled observation of pt consuming dysphagia 3 and thin liquids via cup sips. Pt continues to be free of overt s/s of aspiration or dysphagia. While silent aspiration cannot be ruled out at bedside, current diet continues to be appropriate. Given pt's history of GERD, his pneumonia may be attributed to aspiration of stomach acid. Pt needs to adhere to strict reflux precautions (sitting upright with PO intake and remaining upright for 30-45 minutes after PO intake). Pt is unlikely to follow these directions given his current mentation. ST intervention is no longer indicated as pt is currently consuming baseline diet.    HPI HPI: [name] is a 59 y.o. male past medical history significant for DM c multiple hyperglycemia admissions, multiple CVA, HTN, ETOH abuse, 2nd toe amputation (12/11/19), ESRD on HD TTS, [name] 12/29/2019 - CT angiogram of the chest to rule out PE, consistent with bilateral lower lobe pneumonia. Currently on 5liters O2.       SLP Plan  All goals met       Recommendations  Diet recommendations: Dysphagia 3 (mechanical soft);Thin liquid Liquids provided via: Cup Medication Administration: Whole meds with liquid Supervision: Patient able to self feed Compensations: Minimize environmental distractions;Slow rate;Small sips/bites Postural Changes and/or Swallow Maneuvers: Seated upright 90 degrees;Upright 30-60 min after meal                 Oral Care Recommendations: Oral care BID Follow up Recommendations: None SLP Visit Diagnosis: Dysphagia, unspecified (R13.10) Plan: All goals met       GO               Ohm Dentler B. Rutherford Nail M.S., [name], [place] Office [phone]  Stormy Fabian 12/30/2019, 8:21 PM

## 2019-12-30 NOTE — Care Management Important Message (Signed)
Important Message  Patient Details  Name: [name] MRN: [id number] Date of Birth: 1960/12/01   Medicare Important Message Given:  Yes     Loann Quill 12/30/2019, 11:44 AM

## 2019-12-30 NOTE — Progress Notes (Signed)
Mobility Specialist - Progress Note   12/30/19 1327  Mobility  Activity Refused mobility  Mobility performed by Mobility specialist     Pt refused mobility session. No specific reason given. Will attempt again at a later time.    Lively Haberman Mobility Specialist  12/30/19, 1:28 PM

## 2019-12-30 NOTE — Progress Notes (Signed)
PHARMACY NOTE:  ANTIMICROBIAL RENAL DOSAGE ADJUSTMENT  Current antimicrobial regimen includes a mismatch between antimicrobial dosage and estimated renal function.  As per policy approved by the Pharmacy & Therapeutics and Medical Executive Committees, the antimicrobial dosage will be adjusted accordingly.  Current antimicrobial dosage:  Unasyn 3 g q12h   Indication: aspiration pneumonia   Renal Function:   Estimated Creatinine Clearance: 15.7 mL/min (A) (by C-G formula based on SCr of 4.42 mg/dL (H)). [x]      On intermittent HD, scheduled: []      On CRRT    Antimicrobial dosage has been changed to:  Unasyn 3 g q24h  Additional comments: Changed to q24h for lower dosing frequency  Thank you for allowing pharmacy to be a part of this patient's care.  Benn Moulder, PharmD Pharmacy Resident  12/30/2019 1:11 PM

## 2019-12-30 NOTE — Progress Notes (Signed)
PROGRESS NOTE    LEGEND TUMMINELLO  INO:[id number] DOB: 10/19/1960 DOA: 12/24/2019 PCP: [name], MD   Chief complaint.  Shortness of breath.  Brief Narrative: [name], stroke, hypertension, history of alcohol use, [name],Thursday and Saturday,[name] Hemoglobin was at 6. Patient reports a history of shortness of breath on exertion for the last 3 to 4 months. He also reported history of episode ofblack stool a week prior to admission.  8/14.Hemoglobin continued drop to 7.7. We will keep patient for 1 more day .  EGD performed on 8/13 did not show any abnormality or active bleeding.  8/15.Patient developed significant bronchospasm overnight,was placed on high flow oxygen. Treated with DuoNeb for bronchospasm. Procalcitonin level not significantly elevated.   Scheduled for extra hemodialysis tomorrow.  8/16.  Patient still has significant hypoxemia after hemodialysis.  Consider aspiration pneumonia.  Start Unasyn.  Start prophylactic heparin.  8/17.  CT angiogram of the chest to rule out PE, consistent with bilateral lower lobe pneumonia.  Probable aspiration pneumonia, obtain speech therapy evaluation, continue Unasyn.  Still on 5 L oxygen today.    Assessment & Plan:   Active Problems:   Iron deficiency anemia   Uncontrolled diabetes mellitus (HCC)   CVA (cerebral vascular accident) ([place])   Type 2 diabetes mellitus without complication, with long-term current use of insulin ([place])   ESRD (end stage renal disease) ([place])   Anemia   Acute on chronic respiratory failure with hypoxemia (HCC)   Aspiration pneumonia of both lower lobes due to gastric secretions ([place])  #1.  Acute on chronic hypoxemic respiratory failure. Initially thought  was due to volume overload.  Procalcitonin level was not significant elevated.  Condition did not improve after dialysis.  CT angiogram ruled out PE, consistent with aspiration pneumonia.  Patient was on 2 L oxygen before admission.   --Wean O2 as able  2.  Aspiration pneumonia at the bilateral lower lobes ruled in. Patient appeared to have some difficulty clear up upper airway.   --SLP rec dysphagia 3 with thin liquids via cup PLAN: --continue Unasyn  3.  Severe iron deficient anemia. Condition has been stable for the last few days.  Received IV iron.  EGD did not show any active bleeding. --continue oral iron suppl  4.  End-stage renal disease. --iHD per nephrology  5.  Moderate protein calorie malnutrition.  Continue supplement.  6.  Uncontrolled type 2 diabetes with hyperglycemia with occasional hypoglycemia. On lower dose Lantus at 6 units  --d/c bed-time SSI  7.  Recent left second toe amputation. Evaluated by podiatry, no infection.   DVT prophylaxis: Heparin SQ Code Status: Full code  Family Communication:  Status is: inpatient Dispo:   The patient is from: home Anticipated d/c is to: home with [facility] Anticipated d/c date is: 1-2 days Patient currently is not medically stable to d/c due to: increased O2 requirement, need to wean down.  On IV Unasyn for aspiration PNA.    Consultants:  Nephrology  Procedures:HD  Antimicrobials:None     Subjective: Pt was awake, chewing on some crackers, but didn't answer any questions.     Objective: Vitals:   12/29/19 1451 12/29/19 2010 12/30/19 0416 12/30/19 1133  BP: 127/77 130/78 122/80 134/87  Pulse: 91 95 92 (!) 103  Resp:  18 20  Temp: 97.8 F (36.6 C) 98.7 F (37.1 C) 98.4 F (36.9 C)   TempSrc: Oral Oral Oral   SpO2: 95% 95% 98% 97%  Weight:      Height:        Intake/Output Summary (Last 24 hours) at 12/30/2019 1930 Last data filed at 12/30/2019 1700 Gross per 24 hour  Intake 980 ml  Output  --  Net 980 ml   Filed Weights   12/24/19 1208 12/25/19 1328  Weight: 61.2 kg 62.5 kg    Examination:  Constitutional: NAD, alert, not communicative  HEENT: conjunctivae and lids normal, EOMI CV: RRR no M,R,G. Distal pulses +2.  No cyanosis.   RESP: mild wheezes, normal respiratory effort, on 5L GI: +BS, NTND Extremities: No effusions, edema, or tenderness in BLE SKIN: warm, dry and intact Neuro: II - XII grossly intact.     Data Reviewed: I have personally reviewed following labs and imaging studies  CBC: Recent Labs  Lab 12/26/19 0938 12/26/19 0938 12/26/19 1345 12/27/19 0503 12/28/19 0621 12/29/19 0430 12/30/19 0436  WBC 5.7  --   --  4.7 6.1 5.7 5.4  NEUTROABS  --   --   --  2.8 3.9 3.9 3.7  HGB 8.0*   < > 7.7* 7.9* 8.4* 7.9* 7.8*  HCT 24.9*  --   --  24.2* 25.5* 24.0* 25.1*  MCV 95.0  --   --  93.1 93.1 93.4 97.7  PLT 272  --   --  244 246 219 219   < > = values in this interval not displayed.   Basic Metabolic Panel: Recent Labs  Lab 12/24/19 1229 12/24/19 1229 12/25/19 1320 12/26/19 0938 12/28/19 0621 12/29/19 0430 12/30/19 0436  NA 140  --  142 137  --  135 139  K 3.4*  --  3.5 4.1  --  3.4* 4.1  CL 97*  --  101 98  --  95* 99  CO2 32  --   --  26  --  28 28  GLUCOSE 277*  --  77 492*  --  117* 64*  BUN 20  --  26* 34*  --  21* 31*  CREATININE 2.54*   < > 3.50* 4.56* 4.27* 3.11* 4.42*  CALCIUM 7.9*  --   --  8.0*  --  7.9* 8.3*  PHOS  --   --   --  6.6*  --   --   --    < > = values in this interval not displayed.   GFR: Estimated Creatinine Clearance: 15.7 mL/min (A) (by C-G formula based on SCr of 4.42 mg/dL (H)). Liver Function Tests: Recent Labs  Lab 12/26/19 0938  AST 43*  ALT 30  ALKPHOS 178*  BILITOT 0.7  PROT 6.1*  ALBUMIN 2.5*  2.5*   No results for input(s): LIPASE, AMYLASE in the last 168 hours. No results for input(s): AMMONIA in the last 168 hours. Coagulation Profile: No results for input(s): INR, PROTIME in the last  168 hours. Cardiac Enzymes: No results for input(s): CKTOTAL, CKMB, CKMBINDEX, TROPONINI in the last 168 hours. BNP (last 3 results) No results for input(s): PROBNP in the last 8760 hours. HbA1C: No results for input(s): HGBA1C in the last 72 hours. CBG: Recent Labs  Lab 12/29/19 1153 12/29/19 2128 12/30/19 0800 12/30/19 1133 12/30/19 1702  GLUCAP 222* 250* 62* 200* 205*   Lipid Profile: No results for input(s): CHOL, HDL, LDLCALC, TRIG, CHOLHDL, LDLDIRECT in the last 72 hours. Thyroid  Function Tests: No results for input(s): TSH, T4TOTAL, FREET4, T3FREE, THYROIDAB in the last 72 hours. Anemia Panel: No results for input(s): VITAMINB12, FOLATE, FERRITIN, TIBC, IRON, RETICCTPCT in the last 72 hours. Sepsis Labs: Recent Labs  Lab 12/27/19 0503  PROCALCITON 0.48    Recent Results (from the past 240 hour(s))  Gastrointestinal Panel by PCR , Stool     Status: None   Collection Time: 12/24/19  5:12 PM   Specimen: Stool  Result Value Ref Range Status   Campylobacter species NOT DETECTED NOT DETECTED Final   Plesimonas shigelloides NOT DETECTED NOT DETECTED Final   Salmonella species NOT DETECTED NOT DETECTED Final   Yersinia enterocolitica NOT DETECTED NOT DETECTED Final   Vibrio species NOT DETECTED NOT DETECTED Final   Vibrio cholerae NOT DETECTED NOT DETECTED Final   Enteroaggregative E coli (EAEC) NOT DETECTED NOT DETECTED Final   Enteropathogenic E coli (EPEC) NOT DETECTED NOT DETECTED Final   Enterotoxigenic E coli (ETEC) NOT DETECTED NOT DETECTED Final   Shiga like toxin producing E coli (STEC) NOT DETECTED NOT DETECTED Final   Shigella/Enteroinvasive E coli (EIEC) NOT DETECTED NOT DETECTED Final   Cryptosporidium NOT DETECTED NOT DETECTED Final   Cyclospora cayetanensis NOT DETECTED NOT DETECTED Final   Entamoeba histolytica NOT DETECTED NOT DETECTED Final   Giardia lamblia NOT DETECTED NOT DETECTED Final   Adenovirus F40/41 NOT DETECTED NOT DETECTED Final    Astrovirus NOT DETECTED NOT DETECTED Final   Norovirus GI/GII NOT DETECTED NOT DETECTED Final   Rotavirus A NOT DETECTED NOT DETECTED Final   Sapovirus (I, II, IV, and V) NOT DETECTED NOT DETECTED Final    Comment: Performed at [facility], [address] [place], [place] [address]  C Difficile Quick Screen w PCR reflex     Status: None   Collection Time: 12/24/19  5:12 PM   Specimen: STOOL  Result Value Ref Range Status   C Diff antigen NEGATIVE NEGATIVE Final   C Diff toxin NEGATIVE NEGATIVE Final   C Diff interpretation No C. difficile detected.  Final    Comment: Performed at [facility], [address] [place],  [address]  SARS Coronavirus 2 by RT PCR (hospital order, performed in [facility] hospital lab) Nasopharyngeal Nasopharyngeal Swab     Status: None   Collection Time: 12/24/19  5:12 PM   Specimen: Nasopharyngeal Swab  Result Value Ref Range Status   SARS Coronavirus 2 NEGATIVE NEGATIVE Final    Comment: (NOTE) SARS-CoV-2 target nucleic acids are NOT DETECTED.  The SARS-CoV-2 RNA is generally detectable in upper and lower respiratory specimens during the acute phase of infection. The lowest concentration of SARS-CoV-2 viral copies this assay can detect is 250 copies / mL. A negative result does not preclude SARS-CoV-2 infection and should not be used as the sole basis for treatment or other patient management decisions.  A negative result may occur with improper specimen collection / handling, submission of specimen other than nasopharyngeal swab, presence of viral mutation(s) within the areas targeted by this assay, and inadequate number of viral copies (<250 copies / mL). A negative result must be combined with clinical observations, patient history, and epidemiological information.  Fact Sheet for Patients:   StrictlyIdeas.no  Fact Sheet for Healthcare  Providers: BankingDealers.co.za  This test is not yet approved or  cleared by the [place] FDA and has been authorized for detection and/or diagnosis of SARS-CoV-2 by FDA under an Emergency Use Authorization (EUA).  This EUA  will remain in effect (meaning this test can be used) for the duration of the COVID-19 declaration under Section 564(b)(1) of the Act, 21 U.S.C. section 360bbb-3(b)(1), unless the authorization is terminated or revoked sooner.  Performed at [facility], [address] [place], [place] [address]          Radiology Studies: No results found.      Scheduled Meds: . aspirin EC  81 mg Oral Daily  . carvedilol  6.25 mg Oral BID WC  . Chlorhexidine Gluconate Cloth  6 each Topical Daily  . [START ON 12/31/2019] epoetin (EPOGEN/PROCRIT) injection  4,000 Units Intravenous Q T,Th,Sa-HD  . feeding supplement (GLUCERNA SHAKE)  237 mL Oral BID BM  . folic acid  1 mg Oral Daily  . heparin  5,000 Units Subcutaneous Q8H  . hydrALAZINE  25 mg Oral BID  . insulin aspart  0-9 Units Subcutaneous TID WC  . insulin aspart  2 Units Subcutaneous TID WC  . insulin glargine  6 Units Subcutaneous Daily  . iron polysaccharides  150 mg Oral Daily  . melatonin  2.5 mg Oral QHS  . multivitamin  1 tablet Oral Daily  . nicotine  7 mg Transdermal Daily  . pantoprazole (PROTONIX) IV  40 mg Intravenous Q12H  . sertraline  100 mg Oral Daily  . thiamine  100 mg Oral Daily   Continuous Infusions: . ampicillin-sulbactam (UNASYN) IV 3 g (12/30/19 1700)     LOS: 5 days     Enzo Bi, MD [facility]   To contact the attending provider between 7A-7P or the covering provider during after hours 7P-7A, please log into the web site [email or web address] and access using universal  password for that web site. If you do not have the password, please call the hospital operator.  12/30/2019, 7:30 PM

## 2019-12-30 NOTE — Progress Notes (Signed)
Inpatient Diabetes Program Recommendations  AACE/ADA: New Consensus Statement on Inpatient Glycemic Control (2015)  Target Ranges:  Prepandial:   less than 140 mg/dL      Peak postprandial:   less than 180 mg/dL (1-2 hours)      Critically ill patients:  140 - 180 mg/dL   Lab Results  Component Value Date   GLUCAP 200 (H) 12/30/2019   HGBA1C 7.4 (H) 12/10/2019    Review of Glycemic Control Results for [name], [name] (MRN [id number]) as of 12/30/2019 12:00  Ref. Range 12/29/2019 07:42 12/29/2019 11:53 12/29/2019 21:28 12/30/2019 08:00 12/30/2019 11:33  Glucose-Capillary Latest Ref Range: 70 - 99 mg/dL 81 222 (H) 250 (H) 62 (L) 200 (H)  HomeDM Meds: Lantus 20units Daily Humalog 0-15 units TID per SSI  (per ENDO notes: Lantus 10 units QHS and Humalog 2-6 units BID with Lunch and Dinner only)    Current Orders: Lantus 6 units daily Novolog Sensitive Correction Scale/ SSI (0-9 units) TID AC + HS                            Novolog 2 units tid with meals   Inpatient Diabetes Program Recommendations:    Note mild low blood sugar this morning.  Patient did receive 2 units of Novolog at bedtime due to blood sugar of 250 mg/dL.  Consider d/c of Bedtime Novolog correction and reduce Lantus to 5 units daily.   Thanks  Adah Perl, RN, BC-ADM Inpatient Diabetes Coordinator Pager [phone] (8a-5p)

## 2019-12-30 NOTE — Progress Notes (Signed)
[facility], [place] 12/30/19  Subjective:   LOS: 5    Also noted to have low Hgb at admission,  No peripheral edema, Some SOB; requiring 4-5 L O2 Patient states his breathing is at baseline Able to eat without nausea or vomiting  Objective:  Vital signs in last 24 hours:  Temp:  [97.8 F (36.6 C)-98.7 F (37.1 C)] 98.4 F (36.9 C) (08/18 0416) Pulse Rate:  [91-103] 103 (08/18 1133) Resp:  [18-20] 20 (08/18 0416) BP: (122-134)/(77-87) 134/87 (08/18 1133) SpO2:  [95 %-98 %] 97 % (08/18 1133)  Weight change:  Filed Weights   12/24/19 1208 12/25/19 1328  Weight: 61.2 kg 62.5 kg    Intake/Output:    Intake/Output Summary (Last 24 hours) at 12/30/2019 1145 Last data filed at 12/30/2019 1018 Gross per 24 hour  Intake 800 ml  Output 100 ml  Net 700 ml   Physical Exam: General:  No acute distress, resting in bed  HEENT  Oral mucosa moist  Pulm/lungs  normal breathing effort, b/l fine crackles; 4-5 L O2 [place]  CVS/Heart   no rub or gallop  Abdomen:   Soft, nontender  Extremities:  No peripheral edema  Neurologic:  Alert, oriented  Skin:  No rashes,warm,   Rt IJ PC  Basic Metabolic Panel:  Recent Labs  Lab 12/24/19 1229 12/24/19 1229 12/25/19 1320 12/26/19 0938 12/28/19 0621 12/29/19 0430 12/30/19 0436  NA 140  --  142 137  --  135 139  K 3.4*  --  3.5 4.1  --  3.4* 4.1  CL 97*  --  101 98  --  95* 99  CO2 32  --   --  26  --  28 28  GLUCOSE 277*  --  77 492*  --  117* 64*  BUN 20  --  26* 34*  --  21* 31*  CREATININE 2.54*   < > 3.50* 4.56* 4.27* 3.11* 4.42*  CALCIUM 7.9*   < >  --  8.0*  --  7.9* 8.3*  PHOS  --   --   --  6.6*  --   --   --    < > = values in this interval not displayed.     CBC: Recent Labs  Lab 12/26/19 0938 12/26/19 0938 12/26/19 1345 12/27/19 0503 12/28/19 0621 12/29/19 0430 12/30/19 0436  WBC 5.7  --   --  4.7 6.1 5.7 5.4  NEUTROABS  --   --   --  2.8 3.9 3.9 3.7  HGB 8.0*   < > 7.7* 7.9* 8.4*  7.9* 7.8*  HCT 24.9*  --   --  24.2* 25.5* 24.0* 25.1*  MCV 95.0  --   --  93.1 93.1 93.4 97.7  PLT 272  --   --  244 246 219 219   < > = values in this interval not displayed.      Lab Results  Component Value Date   HEPBSAG NON REACTIVE 12/06/2019   HEPBSAB NON REACTIVE 04/10/2019      Microbiology:  Recent Results (from the past 240 hour(s))  Gastrointestinal Panel by PCR , Stool     Status: None   Collection Time: 12/24/19  5:12 PM   Specimen: Stool  Result Value Ref Range Status   Campylobacter species NOT DETECTED NOT DETECTED Final   Plesimonas shigelloides NOT DETECTED NOT DETECTED Final   Salmonella species NOT DETECTED NOT DETECTED Final   Yersinia enterocolitica NOT DETECTED NOT  DETECTED Final   Vibrio species NOT DETECTED NOT DETECTED Final   Vibrio cholerae NOT DETECTED NOT DETECTED Final   Enteroaggregative E coli (EAEC) NOT DETECTED NOT DETECTED Final   Enteropathogenic E coli (EPEC) NOT DETECTED NOT DETECTED Final   Enterotoxigenic E coli (ETEC) NOT DETECTED NOT DETECTED Final   Shiga like toxin producing E coli (STEC) NOT DETECTED NOT DETECTED Final   Shigella/Enteroinvasive E coli (EIEC) NOT DETECTED NOT DETECTED Final   Cryptosporidium NOT DETECTED NOT DETECTED Final   Cyclospora cayetanensis NOT DETECTED NOT DETECTED Final   Entamoeba histolytica NOT DETECTED NOT DETECTED Final   Giardia lamblia NOT DETECTED NOT DETECTED Final   Adenovirus F40/41 NOT DETECTED NOT DETECTED Final   Astrovirus NOT DETECTED NOT DETECTED Final   Norovirus GI/GII NOT DETECTED NOT DETECTED Final   Rotavirus A NOT DETECTED NOT DETECTED Final   Sapovirus (I, II, IV, and V) NOT DETECTED NOT DETECTED Final    Comment: Performed at [facility], [address] [place], [place] [address]  C Difficile Quick Screen w PCR reflex     Status: None   Collection Time: 12/24/19  5:12 PM   Specimen: STOOL  Result Value Ref Range Status   C Diff antigen NEGATIVE NEGATIVE Final    C Diff toxin NEGATIVE NEGATIVE Final   C Diff interpretation No C. difficile detected.  Final    Comment: Performed at [facility], [address] [place], [place] [address]  SARS Coronavirus 2 by RT PCR (hospital order, performed in [facility] hospital lab) Nasopharyngeal Nasopharyngeal Swab     Status: None   Collection Time: 12/24/19  5:12 PM   Specimen: Nasopharyngeal Swab  Result Value Ref Range Status   SARS Coronavirus 2 NEGATIVE NEGATIVE Final    Comment: (NOTE) SARS-CoV-2 target nucleic acids are NOT DETECTED.  The SARS-CoV-2 RNA is generally detectable in upper and lower respiratory specimens during the acute phase of infection. The lowest concentration of SARS-CoV-2 viral copies this assay can detect is 250 copies / mL. A negative result does not preclude SARS-CoV-2 infection and should not be used as the sole basis for treatment or other patient management decisions.  A negative result may occur with improper specimen collection / handling, submission of specimen other than nasopharyngeal swab, presence of viral mutation(s) within the areas targeted by this assay, and inadequate number of viral copies (<250 copies / mL). A negative result must be combined with clinical observations, patient history, and epidemiological information.  Fact Sheet for Patients:   StrictlyIdeas.no  Fact Sheet for Healthcare Providers: BankingDealers.co.za  This test is not yet approved or  cleared by the [place] FDA and has been authorized for detection and/or diagnosis of SARS-CoV-2 by FDA under an Emergency Use Authorization (EUA).  This EUA will remain in effect (meaning this test can be used) for the duration of the COVID-19 declaration under Section 564(b)(1) of the Act, 21 U.S.C. section 360bbb-3(b)(1), unless the authorization is terminated or revoked sooner.  Performed at [facility], [address] [place], [place] [address]     Coagulation Studies: No results for input(s): LABPROT, INR in the last 72 hours.  Urinalysis: No results for input(s): COLORURINE, LABSPEC, PHURINE, GLUCOSEU, HGBUR, BILIRUBINUR, KETONESUR, PROTEINUR, UROBILINOGEN, NITRITE, LEUKOCYTESUR in the last 72 hours.  Invalid input(s): APPERANCEUR    Imaging: CT ANGIO CHEST PE W OR WO CONTRAST  Result Date: 12/28/2019 CLINICAL DATA:  59 year old male with concern for pulmonary embolism. EXAM: CT  ANGIOGRAPHY CHEST WITH CONTRAST TECHNIQUE: Multidetector CT imaging of the chest was performed using the standard protocol during bolus administration of intravenous contrast. Multiplanar CT image reconstructions and MIPs were obtained to evaluate the vascular anatomy. CONTRAST:  52mL OMNIPAQUE IOHEXOL 350 MG/ML SOLN COMPARISON:  Chest CT dated 12/06/2019. FINDINGS: Evaluation of this exam is limited due to respiratory motion artifact. Cardiovascular: There is moderate cardiomegaly. Small pericardial effusion measuring 11 mm in thickness anterior to the heart. There is coronary vascular calcification primarily involving the LAD and left coronary artery. There is mild atherosclerotic calcification of the thoracic aorta. No aneurysmal dilatation. There is dilatation the main pulmonary trunk suggestive of a degree of pulmonary hypertension. Evaluation of the pulmonary arteries is limited due to respiratory motion artifact. No pulmonary artery embolus identified. Mediastinum/Nodes: Mildly enlarged right hilar lymph node measured 2 cm. Additional mediastinal adenopathy noted but suboptimally visualized. There is nodular thickening of the anterior mediastinal border in the prevascular space (83/5), likely related to underlying adenopathy. The esophagus is poorly visualized. A right IJ catheter with tip at the cavoatrial junction noted. Lungs/Pleura: Moderate bilateral pleural effusions with associated partial compressive atelectasis of the  lower lobes. Pneumonia is not excluded. Clinical correlation is recommended. There is diffuse interstitial and interlobular septal prominence consistent with edema. There is no pneumothorax. Secretions noted within the trachea. The central airways are patent. Upper Abdomen: Diffuse pancreatic calcification consistent with chronic pancreatitis. Musculoskeletal: No chest wall abnormality. No acute or significant osseous findings. Review of the MIP images confirms the above findings. IMPRESSION: 1. No CT evidence of pulmonary artery embolus. 2. Moderate cardiomegaly with findings of CHF and moderate bilateral pleural effusions. 3. Bilateral lower lobe partial compressive atelectasis. Pneumonia is not excluded. Clinical correlation is recommended. 4. Aortic Atherosclerosis (ICD10-I70.0). Electronically Signed   By: Anner Crete M.D.   On: 12/28/2019 17:21     Medications:   . ampicillin-sulbactam (UNASYN) IV 3 g (12/30/19 0527)   . aspirin EC  81 mg Oral Daily  . carvedilol  6.25 mg Oral BID WC  . Chlorhexidine Gluconate Cloth  6 each Topical Daily  . [START ON 12/31/2019] epoetin (EPOGEN/PROCRIT) injection  4,000 Units Intravenous Q T,Th,Sa-HD  . feeding supplement (GLUCERNA SHAKE)  237 mL Oral BID BM  . folic acid  1 mg Oral Daily  . heparin  5,000 Units Subcutaneous Q8H  . hydrALAZINE  25 mg Oral BID  . insulin aspart  0-9 Units Subcutaneous TID WC  . insulin aspart  2 Units Subcutaneous TID WC  . insulin glargine  6 Units Subcutaneous Daily  . iron polysaccharides  150 mg Oral Daily  . melatonin  2.5 mg Oral QHS  . multivitamin  1 tablet Oral Daily  . nicotine  7 mg Transdermal Daily  . pantoprazole (PROTONIX) IV  40 mg Intravenous Q12H  . sertraline  100 mg Oral Daily  . thiamine  100 mg Oral Daily   acetaminophen **OR** acetaminophen, diphenhydrAMINE, ipratropium-albuterol, ondansetron **OR** ondansetron (ZOFRAN) IV  Assessment/ Plan:  59 y.o. male with  was admitted on 12/24/2019  for  Active Problems:   Iron deficiency anemia   Uncontrolled diabetes mellitus ([place])   CVA (cerebral vascular accident) ([place])   Type 2 diabetes mellitus without complication, with long-term current use of insulin ([place])   ESRD (end stage renal disease) ([place])   Anemia   Acute on chronic respiratory failure with hypoxemia (HCC)   Aspiration pneumonia of both lower lobes due to gastric secretions (HCC)  Cough [  R05] Weakness [R53.1] Anemia [D64.9] Symptomatic anemia [D64.9] Diarrhea, unspecified type [R19.7]  [facility] /TuThSa-1// TW//61.5kg  #. ESRD Next dialysis on Thursday Electrolytes and Volume status are acceptable No acute indication for Dialysis at present   CT angiogram of chest 8/16 - moderate cardiomegaly and pleural effusions Still requiring 4-5 L O2  #. Anemia of CKD  Lab Results  Component Value Date   HGB 7.8 (L) 12/30/2019   Low dose EPO with HD  #. Secondary hyperparathyroidism of renal origin N 25.81   No results found for: PTH Lab Results  Component Value Date   PHOS 6.6 (H) 12/26/2019   Monitor calcium and phos level during this admission   #. Diabetes type 2 with CKD Hemoglobin A1C (%)  Date Value  01/17/2014 < 3.5 (L)   Hgb A1c MFr Bld (%)  Date Value  12/10/2019 7.4 (H)    # Anemia from blood loss and Gi Bleed/melena Received blood transfusion this admission Upper endoscopy (12/25/19)- Normal   hgb trend       LOS: [place] 8/18/202111:[id number] [place], [place]

## 2019-12-31 ENCOUNTER — Inpatient Hospital Stay: Payer: Medicare Other

## 2019-12-31 LAB — GLUCOSE, CAPILLARY
Glucose-Capillary: 61 mg/dL — ABNORMAL LOW (ref 70–99)
Glucose-Capillary: 94 mg/dL (ref 70–99)
Glucose-Capillary: 65 mg/dL — ABNORMAL LOW (ref 70–99)
Glucose-Capillary: 132 mg/dL — ABNORMAL HIGH (ref 70–99)
Glucose-Capillary: 358 mg/dL — ABNORMAL HIGH (ref 70–99)

## 2019-12-31 LAB — CBC
HCT: 26.2 % — ABNORMAL LOW (ref 39.0–52.0)
Hemoglobin: 8.5 g/dL — ABNORMAL LOW (ref 13.0–17.0)
MCH: 30.8 pg (ref 26.0–34.0)
MCHC: 32.4 g/dL (ref 30.0–36.0)
MCV: 94.9 fL (ref 80.0–100.0)
Platelets: 225 10*3/uL (ref 150–400)
RBC: 2.76 MIL/uL — ABNORMAL LOW (ref 4.22–5.81)
RDW: 16.6 % — ABNORMAL HIGH (ref 11.5–15.5)
WBC: 5.5 10*3/uL (ref 4.0–10.5)
nRBC: 0 % (ref 0.0–0.2)

## 2019-12-31 LAB — PROCALCITONIN: Procalcitonin: 1.77 ng/mL

## 2019-12-31 LAB — BASIC METABOLIC PANEL
Anion gap: 13 (ref 5–15)
BUN: 37 mg/dL — ABNORMAL HIGH (ref 6–20)
CO2: 25 mmol/L (ref 22–32)
Calcium: 8.4 mg/dL — ABNORMAL LOW (ref 8.9–10.3)
Chloride: 103 mmol/L (ref 98–111)
Creatinine, Ser: 5.11 mg/dL — ABNORMAL HIGH (ref 0.61–1.24)
GFR calc Af Amer: 13 mL/min — ABNORMAL LOW (ref >60)
GFR calc non Af Amer: 11 mL/min — ABNORMAL LOW (ref >60)
Glucose, Bld: 56 mg/dL — ABNORMAL LOW (ref 70–99)
Potassium: 4.8 mmol/L (ref 3.5–5.1)
Sodium: 141 mmol/L (ref 135–145)

## 2019-12-31 LAB — MAGNESIUM: Magnesium: 2.4 mg/dL (ref 1.7–2.4)

## 2019-12-31 NOTE — TOC Progression Note (Signed)
Transition of Care [facility]) - Progression Note    Patient Details  Name: [name] MRN: [id number] Date of Birth: 1960/12/01  Transition of Care [facility]) CM/SW [place], LCSW Phone Number: 12/31/2019, 8:35 AM  Clinical Narrative:    CSW continued the search for a [facility] provider without success. Discussed in LOS meeting, Zack asked to be notified if still no HH available by 10:00am 8/19.   Expected Discharge Plan: [place] Barriers to Discharge: Continued Medical Work up  Expected Discharge Plan and Services Expected Discharge Plan: [place]: [place] arrangements for the past 2 months: Single Family Home                                       Social Determinants of Health (SDOH) Interventions    Readmission Risk Interventions Readmission Risk Prevention Plan 12/29/2019 12/09/2019 07/07/2019  Transportation Screening Complete Complete Complete  PCP or Specialist Appt within 3-5 Days - - Complete  HRI or Home Care Consult - - Complete  Palliative Care Screening - - -  Medication Review (RN Care Manager) Complete Complete Complete  PCP or Specialist appointment within 3-5 days of discharge Complete Complete -  [place] or Home Care Consult Complete Complete -  SW Recovery Care/Counseling Consult Complete Complete -  Palliative Care Screening Not Applicable Not Applicable -  [place] Not Applicable Not Applicable -  Some recent data might be hidden

## 2019-12-31 NOTE — Progress Notes (Signed)
PT Cancellation Note  Patient Details Name: [name] MRN: [id number] DOB: 1961-01-21   Cancelled Treatment:    Reason Eval/Treat Not Completed: Fatigue/lethargy limiting ability to participate (First attempt, pt out of room for HD. 2nd attempt, pt reports too lethargic in his post dialysis state to participate in PT. Will attempt again later date/time.)  4:35 PM, 12/31/19 Etta Grandchild, PT, DPT Physical Therapist - [facility]  [phone] ([place])    [place] C 12/31/2019, 4:35 PM

## 2019-12-31 NOTE — Progress Notes (Signed)
Mobility Specialist - Progress Note   12/31/19 1347  Mobility  Activity  (Cancellation)  Mobility performed by Mobility specialist     Pt unavailable to see for mobility session d/t HD treatment. Will attempt session at a later date/time.    [name] Mobility Specialist  12/31/19, 1:48 PM

## 2019-12-31 NOTE — Progress Notes (Signed)
Inpatient Diabetes Program Recommendations  AACE/ADA: New Consensus Statement on Inpatient Glycemic Control (2015)  Target Ranges:  Prepandial:   less than 140 mg/dL      Peak postprandial:   less than 180 mg/dL (1-2 hours)      Critically ill patients:  140 - 180 mg/dL   Results for [name], [name] (MRN [id number]) as of 12/31/2019 08:37  Ref. Range 12/30/2019 08:00 12/30/2019 11:33 12/30/2019 17:02 12/30/2019 20:58 12/30/2019 22:03  Glucose-Capillary Latest Ref Range: 70 - 99 mg/dL 62 (L)    6 units LANTUS 200 (H)  4 units NOVOLOG  205 (H)  5 units NOVOLOG  59 (L) 107 (H)   Results for [name], [name] (MRN [id number]) as of 12/31/2019 08:37  Ref. Range 12/31/2019 07:47  Glucose-Capillary Latest Ref Range: 70 - 99 mg/dL 61 (L)     Home DM Meds: Lantus 20 units Daily                              Humalog 0-15 units TID per SSI                             (per ENDO notes: Lantus 10 units QHS and Humalog 2-6 units BID with Lunch and Dinner only)    Current Orders: Novolog Sensitive Correction Scale/ SSI (0-9 units) TID AC     MD- Note patient having issues with Hypoglycemia.  Note that Lantus and Novolog Meal Coverage stopped this AM.  Concern that patient could have Hyperglycemia without any basal insulin.  Would you consider adding back Lantus 4 units QHS?    --Will follow patient during hospitalization--  Wyn Quaker RN, MSN, CDE Diabetes Coordinator Inpatient Glycemic Control Team Team Pager: [phone] (8a-5p)

## 2019-12-31 NOTE — Progress Notes (Signed)
[facility], [place] 12/31/19  Subjective:   LOS: 6  Seen in dilalysis today Dyspnea on exertion and Low Hgb at admission,  No peripheral edema, Some SOB; requiring 4-5 L O2 Patient states his breathing is at baseline Able to eat without nausea or vomiting    HEMODIALYSIS FLOWSHEET:  Blood Flow Rate (mL/min): 200 mL/min Arterial Pressure (mmHg): -60 mmHg Venous Pressure (mmHg): 10 mmHg Transmembrane Pressure (mmHg): 30 mmHg Ultrafiltration Rate (mL/min): 300 mL/min Dialysate Flow Rate (mL/min): 600 ml/min Conductivity: Machine : 13.8 Conductivity: Machine : 13.8 Dialysis Fluid Bolus: Normal Saline Bolus Amount (mL): 250 mL    Objective:  Vital signs in last 24 hours:  Temp:  [97.6 F (36.4 C)-98.6 F (37 C)] 97.6 F (36.4 C) (08/19 1025) Pulse Rate:  [90-108] 92 (08/19 1330) Resp:  [18-27] 24 (08/19 1330) BP: (134-167)/(71-94) 147/71 (08/19 1330) SpO2:  [90 %-97 %] 91 % (08/19 1025)  Weight change:  Filed Weights   12/24/19 1208 12/25/19 1328  Weight: 61.2 kg 62.5 kg    Intake/Output:    Intake/Output Summary (Last 24 hours) at 12/31/2019 1342 Last data filed at 12/31/2019 0300 Gross per 24 hour  Intake 580 ml  Output --  Net 580 ml   Physical Exam: General:  Resting in bed, receiving dialysis treatment, tolerating well  HEENT  Oral mucosa moist  Pulm/lungs  Dyspnea on exertion requiring 4-5 L O2 [place], Lungs with bilateral fine crackles  CVS/Heart   S1S2,no rub or gallop  Abdomen:   Soft, nontender  Extremities:  No peripheral edema  Neurologic:  Alert, oriented  Skin:  No rashes or lesions noted  Rt IJ PC  Basic Metabolic Panel:  Recent Labs  Lab 12/25/19 1320 12/25/19 1320 12/26/19 0938 12/26/19 0938 12/28/19 0621 12/29/19 0430 12/30/19 0436 12/31/19 0403  NA 142  --  137  --   --  135 139 141  K 3.5  --  4.1  --   --  3.4* 4.1 4.8  CL 101  --  98  --   --  95* 99 103  CO2  --   --  26  --   --  28 28 25    GLUCOSE 77  --  492*  --   --  117* 64* 56*  BUN 26*  --  34*  --   --  21* 31* 37*  CREATININE 3.50*   < > 4.56*  --  4.27* 3.11* 4.42* 5.11*  CALCIUM  --   --  8.0*   < >  --  7.9* 8.3* 8.4*  MG  --   --   --   --   --   --   --  2.4  PHOS  --   --  6.6*  --   --   --   --   --    < > = values in this interval not displayed.     CBC: Recent Labs  Lab 12/27/19 0503 12/28/19 0621 12/29/19 0430 12/30/19 0436 12/31/19 0403  WBC 4.7 6.1 5.7 5.4 5.5  NEUTROABS 2.8 3.9 3.9 3.7  --   HGB 7.9* 8.4* 7.9* 7.8* 8.5*  HCT 24.2* 25.5* 24.0* 25.1* 26.2*  MCV 93.1 93.1 93.4 97.7 94.9  PLT 244 246 219 219 225      Lab Results  Component Value Date   HEPBSAG NON REACTIVE 12/06/2019   HEPBSAB NON REACTIVE 04/10/2019      Microbiology:  Recent Results (from  the past 240 hour(s))  Gastrointestinal Panel by PCR , Stool     Status: None   Collection Time: 12/24/19  5:12 PM   Specimen: Stool  Result Value Ref Range Status   Campylobacter species NOT DETECTED NOT DETECTED Final   Plesimonas shigelloides NOT DETECTED NOT DETECTED Final   Salmonella species NOT DETECTED NOT DETECTED Final   Yersinia enterocolitica NOT DETECTED NOT DETECTED Final   Vibrio species NOT DETECTED NOT DETECTED Final   Vibrio cholerae NOT DETECTED NOT DETECTED Final   Enteroaggregative E coli (EAEC) NOT DETECTED NOT DETECTED Final   Enteropathogenic E coli (EPEC) NOT DETECTED NOT DETECTED Final   Enterotoxigenic E coli (ETEC) NOT DETECTED NOT DETECTED Final   Shiga like toxin producing E coli (STEC) NOT DETECTED NOT DETECTED Final   Shigella/Enteroinvasive E coli (EIEC) NOT DETECTED NOT DETECTED Final   Cryptosporidium NOT DETECTED NOT DETECTED Final   Cyclospora cayetanensis NOT DETECTED NOT DETECTED Final   Entamoeba histolytica NOT DETECTED NOT DETECTED Final   Giardia lamblia NOT DETECTED NOT DETECTED Final   Adenovirus F40/41 NOT DETECTED NOT DETECTED Final   Astrovirus NOT DETECTED NOT DETECTED  Final   Norovirus GI/GII NOT DETECTED NOT DETECTED Final   Rotavirus A NOT DETECTED NOT DETECTED Final   Sapovirus (I, II, IV, and V) NOT DETECTED NOT DETECTED Final    Comment: Performed at [facility], [address] [place], [place] [address]  C Difficile Quick Screen w PCR reflex     Status: None   Collection Time: 12/24/19  5:12 PM   Specimen: STOOL  Result Value Ref Range Status   C Diff antigen NEGATIVE NEGATIVE Final   C Diff toxin NEGATIVE NEGATIVE Final   C Diff interpretation No C. difficile detected.  Final    Comment: Performed at [facility], [address] [place], [place] [address]  SARS Coronavirus 2 by RT PCR (hospital order, performed in [facility] hospital lab) Nasopharyngeal Nasopharyngeal Swab     Status: None   Collection Time: 12/24/19  5:12 PM   Specimen: Nasopharyngeal Swab  Result Value Ref Range Status   SARS Coronavirus 2 NEGATIVE NEGATIVE Final    Comment: (NOTE) SARS-CoV-2 target nucleic acids are NOT DETECTED.  The SARS-CoV-2 RNA is generally detectable in upper and lower respiratory specimens during the acute phase of infection. The lowest concentration of SARS-CoV-2 viral copies this assay can detect is 250 copies / mL. A negative result does not preclude SARS-CoV-2 infection and should not be used as the sole basis for treatment or other patient management decisions.  A negative result may occur with improper specimen collection / handling, submission of specimen other than nasopharyngeal swab, presence of viral mutation(s) within the areas targeted by this assay, and inadequate number of viral copies (<250 copies / mL). A negative result must be combined with clinical observations, patient history, and epidemiological information.  Fact Sheet for Patients:   StrictlyIdeas.no  Fact Sheet for Healthcare Providers: BankingDealers.co.za  This test is not yet approved or   cleared by the [place] FDA and has been authorized for detection and/or diagnosis of SARS-CoV-2 by FDA under an Emergency Use Authorization (EUA).  This EUA will remain in effect (meaning this test can be used) for the duration of the COVID-19 declaration under Section 564(b)(1) of the Act, 21 U.S.C. section 360bbb-3(b)(1), unless the authorization is terminated or revoked sooner.  Performed at [facility], [address], [place], [place] [address]  Coagulation Studies: No results for input(s): LABPROT, INR in the last 72 hours.  Urinalysis: No results for input(s): COLORURINE, LABSPEC, PHURINE, GLUCOSEU, HGBUR, BILIRUBINUR, KETONESUR, PROTEINUR, UROBILINOGEN, NITRITE, LEUKOCYTESUR in the last 72 hours.  Invalid input(s): APPERANCEUR    Imaging: No results found.   Medications:   . ampicillin-sulbactam (UNASYN) IV Stopped (12/30/19 1803)   . aspirin EC  81 mg Oral Daily  . carvedilol  6.25 mg Oral BID WC  . Chlorhexidine Gluconate Cloth  6 each Topical Daily  . epoetin (EPOGEN/PROCRIT) injection  4,000 Units Intravenous Q T,Th,Sa-HD  . feeding supplement (GLUCERNA SHAKE)  237 mL Oral BID BM  . folic acid  1 mg Oral Daily  . heparin  5,000 Units Subcutaneous Q8H  . hydrALAZINE  25 mg Oral BID  . insulin aspart  0-9 Units Subcutaneous TID WC  . iron polysaccharides  150 mg Oral Daily  . melatonin  2.5 mg Oral QHS  . multivitamin  1 tablet Oral Daily  . nicotine  7 mg Transdermal Daily  . pantoprazole (PROTONIX) IV  40 mg Intravenous Q12H  . sertraline  100 mg Oral Daily  . thiamine  100 mg Oral Daily   acetaminophen **OR** acetaminophen, diphenhydrAMINE, ipratropium-albuterol, ondansetron **OR** ondansetron (ZOFRAN) IV  Assessment/ Plan:  59 y.o. male with  was admitted on 12/24/2019 for  Active Problems:   Iron deficiency anemia   Uncontrolled diabetes mellitus ([place])   CVA (cerebral vascular accident) ([place])   Type 2 diabetes mellitus without  complication, with long-term current use of insulin ([place])   ESRD (end stage renal disease) ([place])   Anemia   Acute on chronic respiratory failure with hypoxemia (HCC)   Aspiration pneumonia of both lower lobes due to gastric secretions (HCC)  Cough [R05] Weakness [R53.1] Anemia [D64.9] Symptomatic anemia [D64.9] Diarrhea, unspecified type [R19.7]  [facility] /TuThSa-1// TW//61.5kg  #. ESRD Dialysis today Electrolytes and Volume status are acceptable Patient seen during dialysis Tolerating well  UF goal 2-2.5 kg as tolerated   CT angiogram of chest 8/16 - moderate cardiomegaly and pleural effusions Still requiring 4-5 L O2  #. Anemia of CKD  Lab Results  Component Value Date   HGB 8.5 (L) 12/31/2019   Low dose EPO with HD  #. Secondary hyperparathyroidism of renal origin N 25.81   No results found for: PTH Lab Results  Component Value Date   PHOS 6.6 (H) 12/26/2019   Monitor calcium and phos level during this admission   #. Diabetes type 2 with CKD Hemoglobin A1C (%)  Date Value  01/17/2014 < 3.5 (L)   Hgb A1c MFr Bld (%)  Date Value  12/10/2019 7.4 (H)    # Anemia from blood loss and Gi Bleed/melena Received blood transfusion this admission Upper endoscopy (12/25/19)- Normal   Monitor Hgb . Lab Results  Component Value Date   HGB 8.5 (L) 12/31/2019      LOS: [place] 8/19/20211:42 PM  [facility] [address] [place], [place]

## 2019-12-31 NOTE — Progress Notes (Signed)
OT Cancellation Note  Patient Details Name: DEPAUL ARIZPE MRN: [id number] DOB: 08-Apr-1961   Cancelled Treatment:    Reason Eval/Treat Not Completed: Patient at procedure or test/ unavailable. Pt currently at dialysis. OT will follow up when next available.  Darleen Crocker, MS, OTR/L , CBIS ascom [phone]  12/31/19, 1:36 PM    12/31/2019, 1:35 PM

## 2019-12-31 NOTE — Progress Notes (Signed)
PROGRESS NOTE    [name]  DDU:[id number] DOB: 09-04-1960 DOA: 12/24/2019 PCP: [name], MD   Chief complaint.  Shortness of breath.  Brief Narrative: [name] a 59 y.o.AA malepast medical history significant for diabetes, stroke, hypertension, history of alcohol use, [name],Thursday and Saturday,[name] Hemoglobin was at 6. Patient reports a history of shortness of breath on exertion for the last 3 to 4 months. He also reported history of episode ofblack stool a week prior to admission.  8/14.Hemoglobin continued drop to 7.7. We will keep patient for 1 more day .  EGD performed on 8/13 did not show any abnormality or active bleeding.  8/15.Patient developed significant bronchospasm overnight,was placed on high flow oxygen. Treated with DuoNeb for bronchospasm. Procalcitonin level not significantly elevated.   Scheduled for extra hemodialysis tomorrow.  8/16.  Patient still has significant hypoxemia after hemodialysis.  Consider aspiration pneumonia.  Start Unasyn.  Start prophylactic heparin.  8/17.  CT angiogram of the chest to rule out PE, consistent with bilateral lower lobe pneumonia.  Probable aspiration pneumonia, obtain speech therapy evaluation, continue Unasyn.  Still on 5 L oxygen today.    Assessment & Plan:   Active Problems:   Iron deficiency anemia   Uncontrolled diabetes mellitus (HCC)   CVA (cerebral vascular accident) ([place])   Type 2 diabetes mellitus without complication, with long-term current use of insulin ([place])   ESRD (end stage renal disease) ([place])   Anemia   Acute on chronic respiratory failure with hypoxemia (HCC)   Aspiration pneumonia of both lower lobes due to gastric secretions ([place])  #1.  Acute on chronic hypoxemic respiratory failure. Initially thought  was due to volume overload.  Procalcitonin level was not significant elevated.  Condition did not improve after dialysis.  CT angiogram ruled out PE, consistent with aspiration pneumonia.  Patient was on 2 L oxygen before admission.   --O2 up to 5L. --Wean O2 as able --treat underlying causes  # Pulm edema --repeat CXR today showed worsening "vascular congestion, extensive interstitial and ground-glass disease most likely pulmonary edema. There are at least small bilateral effusions."  --dialysis for fluid removal  2.  Aspiration pneumonia at the bilateral lower lobes ruled in. Patient appeared to have some difficulty clear up upper airway.   PLAN: --continue Unasyn --dysphagia 3 with thin liquids, per SLP  3.  Severe iron deficient anemia. Condition has been stable for the last few days.  Received IV iron.  EGD did not show any active bleeding. --continue oral iron suppl  4.  End-stage renal disease. --requested extra volume removal  5.  Moderate protein calorie malnutrition.  Continue supplement.  6.  Uncontrolled type 2 diabetes with hyperglycemia with frequent hypoglycemia. --very labile BG  --Hold insulin Lantus and meal-time insulin 2/2 recurrent severe hypoglycemia  7.  Recent left second toe amputation. Evaluated by podiatry, no infection.   DVT prophylaxis: Heparin SQ Code Status: Full code  Family Communication:  Status is: inpatient Dispo:   The patient is from: home Anticipated d/c is to: home with [facility] Anticipated d/c date is: undertermined Patient currently is not medically stable to d/c due to: increased O2 requirement, worsening pulm edema in a dialysis pt.    Consultants:  Nephrology  Procedures:HD  Antimicrobials:None     Subjective: Pt didn't answer questions, just said he wanted to go  home.    Later, pt was found to drop O2 sat to 70's while on 5L O2, and reported feeling dizzy.  CXR showed increased pulm  edema.   Objective: Vitals:   12/31/19 1330 12/31/19 1345 12/31/19 1400 12/31/19 1744  BP: (!) 147/71 (!) 143/78 (!) 149/75 128/71  Pulse: 92 98 93 92  Resp: (!) 24 (!) 23 (!) 25 20  Temp:   98.2 F (36.8 C) 98 F (36.7 C)  TempSrc:   Oral   SpO2:   92% (!) 76%  Weight:      Height:        Intake/Output Summary (Last 24 hours) at 12/31/2019 1751 Last data filed at 12/31/2019 1400 Gross per 24 hour  Intake 580 ml  Output 2500 ml  Net -1920 ml   Filed Weights   12/24/19 1208 12/25/19 1328  Weight: 61.2 kg 62.5 kg    Examination:  Constitutional: NAD, alert, not communicative  HEENT: conjunctivae and lids normal, EOMI CV: RRR no M,R,G. Distal pulses +2.  No cyanosis.   RESP: normal respiratory effort, on 5L GI: +BS, NTND Extremities: No effusions, edema, or tenderness in BLE SKIN: warm, dry and intact Neuro: II - XII grossly intact.     Data Reviewed: I have personally reviewed following labs and imaging studies  CBC: Recent Labs  Lab 12/27/19 0503 12/28/19 0621 12/29/19 0430 12/30/19 0436 12/31/19 0403  WBC 4.7 6.1 5.7 5.4 5.5  NEUTROABS 2.8 3.9 3.9 3.7  --   HGB 7.9* 8.4* 7.9* 7.8* 8.5*  HCT 24.2* 25.5* 24.0* 25.1* 26.2*  MCV 93.1 93.1 93.4 97.7 94.9  PLT 244 246 219 219 086   Basic Metabolic Panel: Recent Labs  Lab 12/25/19 1320 12/25/19 1320 12/26/19 0938 12/28/19 0621 12/29/19 0430 12/30/19 0436 12/31/19 0403  NA 142  --  137  --  135 139 141  K 3.5  --  4.1  --  3.4* 4.1 4.8  CL 101  --  98  --  95* 99 103  CO2  --   --  26  --  28 28 25   GLUCOSE 77  --  492*  --  117* 64* 56*  BUN 26*  --  34*  --  21* 31* 37*  CREATININE 3.50*   < > 4.56* 4.27* 3.11* 4.42* 5.11*  CALCIUM  --   --  8.0*  --  7.9* 8.3* 8.4*  MG  --   --   --   --   --   --  2.4  PHOS  --   --  6.6*  --   --   --   --    < > = values in this interval not displayed.   GFR: Estimated Creatinine Clearance: 13.6 mL/min (A) (by C-G formula based on SCr of 5.11 mg/dL  (H)). Liver Function Tests: Recent Labs  Lab 12/26/19 0938  AST 43*  ALT 30  ALKPHOS 178*  BILITOT 0.7  PROT 6.1*  ALBUMIN 2.5*  2.5*   No results for input(s): LIPASE, AMYLASE in the last 168 hours. No results for input(s): AMMONIA in the last 168 hours. Coagulation Profile: No results for input(s): INR, PROTIME in the last 168 hours. Cardiac Enzymes: No results for input(s): CKTOTAL, CKMB, CKMBINDEX, TROPONINI in the last 168 hours. BNP (last 3 results) No results for input(s): PROBNP in the last 8760 hours. HbA1C: No results for input(s): HGBA1C in the last 72 hours. CBG: Recent Labs  Lab 12/30/19 2203  12/31/19 0747 12/31/19 0844 12/31/19 1625 12/31/19 1736  GLUCAP 107* 61* 94 65* 132*   Lipid Profile: No results for input(s): CHOL, HDL, LDLCALC, TRIG, CHOLHDL, LDLDIRECT in the last 72 hours. Thyroid Function Tests: No results for input(s): TSH, T4TOTAL, FREET4, T3FREE, THYROIDAB in the last 72 hours. Anemia Panel: No results for input(s): VITAMINB12, FOLATE, FERRITIN, TIBC, IRON, RETICCTPCT in the last 72 hours. Sepsis Labs: Recent Labs  Lab 12/27/19 0503 12/31/19 0403  PROCALCITON 0.48 1.77    Recent Results (from the past 240 hour(s))  Gastrointestinal Panel by PCR , Stool     Status: None   Collection Time: 12/24/19  5:12 PM   Specimen: Stool  Result Value Ref Range Status   Campylobacter species NOT DETECTED NOT DETECTED Final   Plesimonas shigelloides NOT DETECTED NOT DETECTED Final   Salmonella species NOT DETECTED NOT DETECTED Final   Yersinia enterocolitica NOT DETECTED NOT DETECTED Final   Vibrio species NOT DETECTED NOT DETECTED Final   Vibrio cholerae NOT DETECTED NOT DETECTED Final   Enteroaggregative E coli (EAEC) NOT DETECTED NOT DETECTED Final   Enteropathogenic E coli (EPEC) NOT DETECTED NOT DETECTED Final   Enterotoxigenic E coli (ETEC) NOT DETECTED NOT DETECTED Final   Shiga like toxin producing E coli (STEC) NOT DETECTED NOT DETECTED  Final   Shigella/Enteroinvasive E coli (EIEC) NOT DETECTED NOT DETECTED Final   Cryptosporidium NOT DETECTED NOT DETECTED Final   Cyclospora cayetanensis NOT DETECTED NOT DETECTED Final   Entamoeba histolytica NOT DETECTED NOT DETECTED Final   Giardia lamblia NOT DETECTED NOT DETECTED Final   Adenovirus F40/41 NOT DETECTED NOT DETECTED Final   Astrovirus NOT DETECTED NOT DETECTED Final   Norovirus GI/GII NOT DETECTED NOT DETECTED Final   Rotavirus A NOT DETECTED NOT DETECTED Final   Sapovirus (I, II, IV, and V) NOT DETECTED NOT DETECTED Final    Comment: Performed at [facility], [address] [place], [place] [address]  C Difficile Quick Screen w PCR reflex     Status: None   Collection Time: 12/24/19  5:12 PM   Specimen: STOOL  Result Value Ref Range Status   C Diff antigen NEGATIVE NEGATIVE Final   C Diff toxin NEGATIVE NEGATIVE Final   C Diff interpretation No C. difficile detected.  Final    Comment: Performed at [facility], [address] [place], [place] [address]  SARS Coronavirus 2 by RT PCR (hospital order, performed in [facility] hospital lab) Nasopharyngeal Nasopharyngeal Swab     Status: None   Collection Time: 12/24/19  5:12 PM   Specimen: Nasopharyngeal Swab  Result Value Ref Range Status   SARS Coronavirus 2 NEGATIVE NEGATIVE Final    Comment: (NOTE) SARS-CoV-2 target nucleic acids are NOT DETECTED.  The SARS-CoV-2 RNA is generally detectable in upper and lower respiratory specimens during the acute phase of infection. The lowest concentration of SARS-CoV-2 viral copies this assay can detect is 250 copies / mL. A negative result does not preclude SARS-CoV-2 infection and should not be used as the sole basis for treatment or other patient management decisions.  A negative result may occur with improper specimen collection / handling, submission of specimen other than nasopharyngeal swab, presence of viral mutation(s) within the areas  targeted by this assay, and inadequate number of viral copies (<250 copies / mL). A negative result must be combined with clinical observations, patient history, and epidemiological information.  Fact Sheet for Patients:   StrictlyIdeas.no  Fact Sheet for Healthcare  Providers: BankingDealers.co.za  This test is not yet approved or  cleared by the [place] and has been authorized for detection and/or diagnosis of SARS-CoV-2 by FDA under an Emergency Use Authorization (EUA).  This EUA will remain in effect (meaning this test can be used) for the duration of the COVID-19 declaration under Section 564(b)(1) of the Act, 21 U.S.C. section 360bbb-3(b)(1), unless the authorization is terminated or revoked sooner.  Performed at [facility], [address], [place], [place] [address]          Radiology Studies: No results found.      Scheduled Meds: . aspirin EC  81 mg Oral Daily  . carvedilol  6.25 mg Oral BID WC  . Chlorhexidine Gluconate Cloth  6 each Topical Daily  . epoetin (EPOGEN/PROCRIT) injection  4,000 Units Intravenous Q T,Th,Sa-HD  . feeding supplement (GLUCERNA SHAKE)  237 mL Oral BID BM  . folic acid  1 mg Oral Daily  . heparin  5,000 Units Subcutaneous Q8H  . hydrALAZINE  25 mg Oral BID  . insulin aspart  0-9 Units Subcutaneous TID WC  . iron polysaccharides  150 mg Oral Daily  . melatonin  2.5 mg Oral QHS  . multivitamin  1 tablet Oral Daily  . nicotine  7 mg Transdermal Daily  . pantoprazole (PROTONIX) IV  40 mg Intravenous Q12H  . sertraline  100 mg Oral Daily  . thiamine  100 mg Oral Daily   Continuous Infusions: . ampicillin-sulbactam (UNASYN) IV 3 g (12/31/19 1542)     LOS: 6 days     Enzo Bi, MD [facility]   To contact the attending provider between 7A-7P or the covering provider during after hours 7P-7A, please log into the web site [email or web address] and access using  universal [place] password for that web site. If you do not have the password, please call the hospital operator.  12/31/2019, 5:51 PM

## 2019-12-31 NOTE — TOC Progression Note (Signed)
Transition of Care [facility]) - Progression Note    Patient Details  Name: [name] MRN: [id number] Date of Birth: March 05, 1961  Transition of Care [facility]) CM/SW [place], LCSW Phone Number: 12/31/2019, 8:58 AM  Clinical Narrative:  [place] is able to accept patient for home health services as long as he does not discharge on home IV antibiotics.   Expected Discharge Plan: [place] Barriers to Discharge: Continued Medical Work up  Expected Discharge Plan and Services Expected Discharge Plan: [place] Choice: [place] arrangements for the past 2 months: Single Family Home                                       Social Determinants of Health (SDOH) Interventions    Readmission Risk Interventions Readmission Risk Prevention Plan 12/29/2019 12/09/2019 07/07/2019  Transportation Screening Complete Complete Complete  PCP or Specialist Appt within 3-5 Days - - Complete  HRI or Home Care Consult - - Complete  Palliative Care Screening - - -  Medication Review (RN Care Manager) Complete Complete Complete  PCP or Specialist appointment within 3-5 days of discharge Complete Complete -  [place] or Home Care Consult Complete Complete -  SW Recovery Care/Counseling Consult Complete Complete -  Palliative Care Screening Not Applicable Not Applicable -  [place] Not Applicable Not Applicable -  Some recent data might be hidden

## 2020-01-01 LAB — CBC
HCT: 26.4 % — ABNORMAL LOW (ref 39.0–52.0)
Hemoglobin: 8.2 g/dL — ABNORMAL LOW (ref 13.0–17.0)
MCH: 30.5 pg (ref 26.0–34.0)
MCHC: 31.1 g/dL (ref 30.0–36.0)
MCV: 98.1 fL (ref 80.0–100.0)
Platelets: 194 10*3/uL (ref 150–400)
RBC: 2.69 MIL/uL — ABNORMAL LOW (ref 4.22–5.81)
RDW: 16.7 % — ABNORMAL HIGH (ref 11.5–15.5)
WBC: 4.8 10*3/uL (ref 4.0–10.5)
nRBC: 0 % (ref 0.0–0.2)

## 2020-01-01 LAB — BASIC METABOLIC PANEL
Anion gap: 12 (ref 5–15)
BUN: 19 mg/dL (ref 6–20)
CO2: 28 mmol/L (ref 22–32)
Calcium: 8.1 mg/dL — ABNORMAL LOW (ref 8.9–10.3)
Chloride: 96 mmol/L — ABNORMAL LOW (ref 98–111)
Creatinine, Ser: 3.56 mg/dL — ABNORMAL HIGH (ref 0.61–1.24)
GFR calc Af Amer: 20 mL/min — ABNORMAL LOW (ref >60)
GFR calc non Af Amer: 18 mL/min — ABNORMAL LOW (ref >60)
Glucose, Bld: 327 mg/dL — ABNORMAL HIGH (ref 70–99)
Potassium: 4.1 mmol/L (ref 3.5–5.1)
Sodium: 136 mmol/L (ref 135–145)

## 2020-01-01 LAB — GLUCOSE, CAPILLARY
Glucose-Capillary: 322 mg/dL — ABNORMAL HIGH (ref 70–99)
Glucose-Capillary: 297 mg/dL — ABNORMAL HIGH (ref 70–99)
Glucose-Capillary: 505 mg/dL (ref 70–99)
Glucose-Capillary: 11 mg/dL — CL (ref 70–99)
Glucose-Capillary: 259 mg/dL — ABNORMAL HIGH (ref 70–99)
Glucose-Capillary: 93 mg/dL (ref 70–99)
Glucose-Capillary: 180 mg/dL — ABNORMAL HIGH (ref 70–99)

## 2020-01-01 LAB — GLUCOSE, RANDOM: Glucose, Bld: 158 mg/dL — ABNORMAL HIGH (ref 70–99)

## 2020-01-01 LAB — PROCALCITONIN: Procalcitonin: 1.44 ng/mL

## 2020-01-01 LAB — MAGNESIUM: Magnesium: 2 mg/dL (ref 1.7–2.4)

## 2020-01-01 MED ORDER — INSULIN ASPART 100 UNIT/ML ~~LOC~~ SOLN
3.0000 [IU] | Freq: Three times a day (TID) | SUBCUTANEOUS | Status: DC
  Administered 2020-01-02 – 2020-01-04 (×4): 3 [IU] via SUBCUTANEOUS
  Filled 2020-01-01 (×4): qty 1

## 2020-01-01 MED ORDER — INSULIN ASPART 100 UNIT/ML ~~LOC~~ SOLN: 0.0000 [IU] | Freq: Every day | SUBCUTANEOUS | Status: DC

## 2020-01-01 MED ORDER — INSULIN ASPART 100 UNIT/ML ~~LOC~~ SOLN
0.0000 [IU] | Freq: Three times a day (TID) | SUBCUTANEOUS | Status: DC
  Administered 2020-01-02: 3 [IU] via SUBCUTANEOUS
  Administered 2020-01-02: 4 [IU] via SUBCUTANEOUS
  Administered 2020-01-03 (×2): 2 [IU] via SUBCUTANEOUS
  Filled 2020-01-01 (×4): qty 1

## 2020-01-01 MED ORDER — INSULIN ASPART 100 UNIT/ML ~~LOC~~ SOLN
10.0000 [IU] | Freq: Three times a day (TID) | SUBCUTANEOUS | Status: DC
  Administered 2020-01-01: 10 [IU] via SUBCUTANEOUS

## 2020-01-01 MED ORDER — INSULIN ASPART 100 UNIT/ML ~~LOC~~ SOLN
0.0000 [IU] | Freq: Three times a day (TID) | SUBCUTANEOUS | Status: DC
  Administered 2020-01-01: 15 [IU] via SUBCUTANEOUS
  Filled 2020-01-01: qty 1

## 2020-01-01 MED ORDER — INSULIN GLARGINE 100 UNIT/ML ~~LOC~~ SOLN
4.0000 [IU] | Freq: Every day | SUBCUTANEOUS | Status: DC
  Administered 2020-01-03 – 2020-01-04 (×2): 4 [IU] via SUBCUTANEOUS
  Filled 2020-01-01 (×4): qty 0.04

## 2020-01-01 NOTE — Progress Notes (Signed)
This note also relates to the following rows which could not be included: Pulse Rate - Cannot attach notes to unvalidated device data Resp - Cannot attach notes to unvalidated device data BP - Cannot attach notes to unvalidated device data SpO2 - Cannot attach notes to unvalidated device data  Hd started  

## 2020-01-01 NOTE — Progress Notes (Signed)
Mobility Specialist - Progress Note   01/01/20 1416  Mobility  Activity  (Cancellation)  Mobility performed by Mobility specialist     Pt medically inappropriate to be seen for mobility d/t glucose level standing at 505 this afternoon. Will attempt session again at a later/date when medically appropriate.    Dorell Gatlin Mobility Specialist  01/01/20, 2:18 PM

## 2020-01-01 NOTE — Progress Notes (Signed)
Inpatient Diabetes Program Recommendations  AACE/ADA: New Consensus Statement on Inpatient Glycemic Control (2015)  Target Ranges:  Prepandial:   less than 140 mg/dL      Peak postprandial:   less than 180 mg/dL (1-2 hours)      Critically ill patients:  140 - 180 mg/dL   Lab Results  Component Value Date   FGBMSX 115 ([place]) 01/01/2020   HGBA1C 7.4 (H) 12/10/2019    Review of Glycemic Control Results for DONTAVIUS, KEIM (MRN [id number]) as of 01/01/2020 13:48  Ref. Range 12/31/2019 21:34 01/01/2020 01:43 01/01/2020 07:43 01/01/2020 11:52  Glucose-Capillary Latest Ref Range: 70 - 99 mg/dL 358 (H) 322 (H) 297 (H) 505 (HH)  Home DM Meds:Lantus 20units Daily Humalog 0-15 units TID per SSI  (per ENDO notes: Lantus 10 units QHS and Humalog 2-6 units BID with Lunch and Dinner only)  Current Orders: Novolog 10 units tid with meals Novolog moderate tid with meals and HS  Inpatient Diabetes Program Recommendations:    Note that CBG up to 505 mg/dL. Patient is sensitive to insulin doses and therefore recommend reducing Novolog to  Very sensitive (0-6 units) and reduce Novolog meal coverage to 3 units tid with meals.  Also please restart Lantus 4 units daily.   Thanks,  Adah Perl, RN, BC-ADM Inpatient Diabetes Coordinator Pager [phone] (8a-5p)

## 2020-01-01 NOTE — Progress Notes (Signed)
[facility], [place] 01/01/20  Subjective:   LOS: 7    Dyspnea on exertion and Low Hgb at admission,  No peripheral edema, Some SOB; requiring 4-5 L O2 Patient states his breathing is at baseline Able to eat without nausea or vomiting Extra HD requested today due to failure to wean off O2     Objective:  Vital signs in last 24 hours:  Temp:  [97.6 F (36.4 C)-98.4 F (36.9 C)] 97.6 F (36.4 C) (08/20 1155) Pulse Rate:  [79-94] 79 (08/20 1155) Resp:  [16-22] 16 (08/20 1155) BP: (124-147)/(71-85) 140/78 (08/20 1155) SpO2:  [76 %-100 %] 90 % (08/20 1155)  Weight change:  Filed Weights   12/24/19 1208 12/25/19 1328  Weight: 61.2 kg 62.5 kg    Intake/Output:    Intake/Output Summary (Last 24 hours) at 01/01/2020 1453 Last data filed at 01/01/2020 1000 Gross per 24 hour  Intake 560 ml  Output --  Net 560 ml   Physical Exam: General:  Sitting up in recliner chair  HEENT  Oral mucosa moist  Pulm/lungs   requiring 4-5 L O2 [place], decreased breath sounds at bases  CVS/Heart   S1S2,no rub or gallop  Abdomen:   Soft, nontender  Extremities:  No peripheral edema  Neurologic:  Alert, oriented  Skin:  No rashes or lesions noted  Rt IJ PC  Basic Metabolic Panel:  Recent Labs  Lab 12/26/19 0938 12/26/19 0938 12/28/19 0621 12/29/19 0430 12/29/19 0430 12/30/19 0436 12/31/19 0403 01/01/20 0436  NA 137  --   --  135  --  139 141 136  K 4.1  --   --  3.4*  --  4.1 4.8 4.1  CL 98  --   --  95*  --  99 103 96*  CO2 26  --   --  28  --  28 25 28   GLUCOSE 492*  --   --  117*  --  64* 56* 327*  BUN 34*  --   --  21*  --  31* 37* 19  CREATININE 4.56*   < > 4.27* 3.11*  --  4.42* 5.11* 3.56*  CALCIUM 8.0*   < >  --  7.9*   < > 8.3* 8.4* 8.1*  MG  --   --   --   --   --   --  2.4 2.0  PHOS 6.6*  --   --   --   --   --   --   --    < > = values in this interval not displayed.     CBC: Recent Labs  Lab 12/27/19 0503 12/27/19 0503  12/28/19 5631 12/29/19 0430 12/30/19 0436 12/31/19 0403 01/01/20 0436  WBC 4.7   < > 6.1 5.7 5.4 5.5 4.8  NEUTROABS 2.8  --  3.9 3.9 3.7  --   --   HGB 7.9*   < > 8.4* 7.9* 7.8* 8.5* 8.2*  HCT 24.2*   < > 25.5* 24.0* 25.1* 26.2* 26.4*  MCV 93.1   < > 93.1 93.4 97.7 94.9 98.1  PLT 244   < > 246 219 219 225 194   < > = values in this interval not displayed.      Lab Results  Component Value Date   HEPBSAG NON REACTIVE 12/06/2019   HEPBSAB NON REACTIVE 04/10/2019      Microbiology:  Recent Results (from the past 240 hour(s))  Gastrointestinal Panel by PCR ,  Stool     Status: None   Collection Time: 12/24/19  5:12 PM   Specimen: Stool  Result Value Ref Range Status   Campylobacter species NOT DETECTED NOT DETECTED Final   Plesimonas shigelloides NOT DETECTED NOT DETECTED Final   Salmonella species NOT DETECTED NOT DETECTED Final   Yersinia enterocolitica NOT DETECTED NOT DETECTED Final   Vibrio species NOT DETECTED NOT DETECTED Final   Vibrio cholerae NOT DETECTED NOT DETECTED Final   Enteroaggregative E coli (EAEC) NOT DETECTED NOT DETECTED Final   Enteropathogenic E coli (EPEC) NOT DETECTED NOT DETECTED Final   Enterotoxigenic E coli (ETEC) NOT DETECTED NOT DETECTED Final   Shiga like toxin producing E coli (STEC) NOT DETECTED NOT DETECTED Final   Shigella/Enteroinvasive E coli (EIEC) NOT DETECTED NOT DETECTED Final   Cryptosporidium NOT DETECTED NOT DETECTED Final   Cyclospora cayetanensis NOT DETECTED NOT DETECTED Final   Entamoeba histolytica NOT DETECTED NOT DETECTED Final   Giardia lamblia NOT DETECTED NOT DETECTED Final   Adenovirus F40/41 NOT DETECTED NOT DETECTED Final   Astrovirus NOT DETECTED NOT DETECTED Final   Norovirus GI/GII NOT DETECTED NOT DETECTED Final   Rotavirus A NOT DETECTED NOT DETECTED Final   Sapovirus (I, II, IV, and V) NOT DETECTED NOT DETECTED Final    Comment: Performed at [facility], [address] [place], [place] [address]   C Difficile Quick Screen w PCR reflex     Status: None   Collection Time: 12/24/19  5:12 PM   Specimen: STOOL  Result Value Ref Range Status   C Diff antigen NEGATIVE NEGATIVE Final   C Diff toxin NEGATIVE NEGATIVE Final   C Diff interpretation No C. difficile detected.  Final    Comment: Performed at [facility], [address] [place], [place] [address]  SARS Coronavirus 2 by RT PCR (hospital order, performed in [facility] hospital lab) Nasopharyngeal Nasopharyngeal Swab     Status: None   Collection Time: 12/24/19  5:12 PM   Specimen: Nasopharyngeal Swab  Result Value Ref Range Status   SARS Coronavirus 2 NEGATIVE NEGATIVE Final    Comment: (NOTE) SARS-CoV-2 target nucleic acids are NOT DETECTED.  The SARS-CoV-2 RNA is generally detectable in upper and lower respiratory specimens during the acute phase of infection. The lowest concentration of SARS-CoV-2 viral copies this assay can detect is 250 copies / mL. A negative result does not preclude SARS-CoV-2 infection and should not be used as the sole basis for treatment or other patient management decisions.  A negative result may occur with improper specimen collection / handling, submission of specimen other than nasopharyngeal swab, presence of viral mutation(s) within the areas targeted by this assay, and inadequate number of viral copies (<250 copies / mL). A negative result must be combined with clinical observations, patient history, and epidemiological information.  Fact Sheet for Patients:   StrictlyIdeas.no  Fact Sheet for Healthcare Providers: BankingDealers.co.za  This test is not yet approved or  cleared by the [place] FDA and has been authorized for detection and/or diagnosis of SARS-CoV-2 by FDA under an Emergency Use Authorization (EUA).  This EUA will remain in effect (meaning this test can be used) for the duration of the COVID-19 declaration  under Section 564(b)(1) of the Act, 21 U.S.C. section 360bbb-3(b)(1), unless the authorization is terminated or revoked sooner.  Performed at [facility], [address] [place],  [address]     Coagulation Studies: No results for input(s): LABPROT, INR  in the last 72 hours.  Urinalysis: No results for input(s): COLORURINE, LABSPEC, PHURINE, GLUCOSEU, HGBUR, BILIRUBINUR, KETONESUR, PROTEINUR, UROBILINOGEN, NITRITE, LEUKOCYTESUR in the last 72 hours.  Invalid input(s): APPERANCEUR    Imaging: DG Chest Port 1 View  Result Date: 12/31/2019 CLINICAL DATA:  Hypoxia EXAM: PORTABLE CHEST 1 VIEW COMPARISON:  12/27/2018, CT chest 12/28/2018 FINDINGS: Right-sided central venous catheter tip over the distal SVC. Cardiomegaly with at least small bilateral effusions. Vascular congestion with extensive interstitial and ground-glass disease most likely pulmonary edema. Bibasilar consolidations. Aortic atherosclerosis. No pneumothorax. IMPRESSION: Cardiomegaly with vascular congestion, extensive interstitial and ground-glass disease most likely pulmonary edema. There are at least small bilateral effusions. Bibasilar consolidations. Electronically Signed   By: Donavan Foil M.D.   On: 12/31/2019 19:49     Medications:    . aspirin EC  81 mg Oral Daily  . carvedilol  6.25 mg Oral BID WC  . Chlorhexidine Gluconate Cloth  6 each Topical Daily  . epoetin (EPOGEN/PROCRIT) injection  4,000 Units Intravenous Q T,Th,Sa-HD  . feeding supplement (GLUCERNA SHAKE)  237 mL Oral BID BM  . folic acid  1 mg Oral Daily  . heparin  5,000 Units Subcutaneous Q8H  . hydrALAZINE  25 mg Oral BID  . insulin aspart  0-15 Units Subcutaneous TID WC  . insulin aspart  0-5 Units Subcutaneous QHS  . insulin aspart  10 Units Subcutaneous TID WC  . iron polysaccharides  150 mg Oral Daily  . melatonin  2.5 mg Oral QHS  . multivitamin  1 tablet Oral Daily  . nicotine  7 mg Transdermal Daily  . pantoprazole  (PROTONIX) IV  40 mg Intravenous Q12H  . sertraline  100 mg Oral Daily  . thiamine  100 mg Oral Daily   acetaminophen **OR** acetaminophen, diphenhydrAMINE, ipratropium-albuterol, ondansetron **OR** ondansetron (ZOFRAN) IV  Assessment/ Plan:  59 y.o. male with  was admitted on 12/24/2019 for  Active Problems:   Iron deficiency anemia   Uncontrolled diabetes mellitus ([place])   CVA (cerebral vascular accident) ([place])   Type 2 diabetes mellitus without complication, with long-term current use of insulin ([place])   ESRD (end stage renal disease) ([place])   Anemia   Acute on chronic respiratory failure with hypoxemia (HCC)   Aspiration pneumonia of both lower lobes due to gastric secretions (HCC)  Cough [R05] Weakness [R53.1] Anemia [D64.9] Symptomatic anemia [D64.9] Diarrhea, unspecified type [R19.7]  [facility] /TuThSa-1// TW//61.5kg  #. ESRD with shortness of breath Extra UF only treatment today as patient is Still requiring 4-5 L O2 UF goal 2-2.5 kg as tolerated Routine hemodialysis planned for tomorrow   CT angiogram of chest 8/16 - moderate cardiomegaly and pleural effusions   #. Anemia of CKD  Lab Results  Component Value Date   HGB 8.2 (L) 01/01/2020   Low dose EPO with HD  #. Secondary hyperparathyroidism of renal origin N 25.81   No results found for: PTH Lab Results  Component Value Date   PHOS 6.6 (H) 12/26/2019   Monitor calcium and phos level during this admission   #. Diabetes type 2 with CKD Hemoglobin A1C (%)  Date Value  01/17/2014 < 3.5 (L)   Hgb A1c MFr Bld (%)  Date Value  12/10/2019 7.4 (H)    # Anemia from blood loss and Gi Bleed/melena Received blood transfusion this admission Upper endoscopy (12/25/19)- Normal   Monitor Hgb . Lab Results  Component Value Date   HGB 8.2 (L) 01/01/2020  LOS: 7 Soma Lizak 8/20/20212:53 PM  [facility] [place], [place]

## 2020-01-01 NOTE — Progress Notes (Signed)
Apple juice given to patient, FSBS of 11.

## 2020-01-01 NOTE — Progress Notes (Addendum)
Inpatient Diabetes Program Recommendations  AACE/ADA: New Consensus Statement on Inpatient Glycemic Control (2015)  Target Ranges:  Prepandial:   less than 140 mg/dL      Peak postprandial:   less than 180 mg/dL (1-2 hours)      Critically ill patients:  140 - 180 mg/dL   Lab Results  Component Value Date   GLUCAP 297 (H) 01/01/2020   HGBA1C 7.4 (H) 12/10/2019    Review of Glycemic Control Results for [name], [name] (MRN [id number]) as of 01/01/2020 09:21  Ref. Range 12/31/2019 16:25 12/31/2019 17:36 12/31/2019 21:34 01/01/2020 01:43 01/01/2020 07:43  Glucose-Capillary Latest Ref Range: 70 - 99 mg/dL 65 (L) 132 (H) 358 (H) 322 (H) 297 (H)  Home DM Meds: Lantus 20units Daily Humalog 0-15 units TID per SSI  (per ENDO notes: Lantus 10 units QHS and Humalog 2-6 units BID with Lunch and Dinner only)    Current Orders: Novolog Sensitive Correction Scale/ SSI (0-9 units) TID [facility]  Inpatient Diabetes Program Recommendations:   Please reduce Novolog correction to very sensitive (0-6 untis) tid with meals. Also please restart Lantus 4 units daily.   Thanks,  Adah Perl, RN, BC-ADM Inpatient Diabetes Coordinator Pager [phone] (8a-5p)

## 2020-01-01 NOTE — Progress Notes (Signed)
This note also relates to the following rows which could not be included: Pulse Rate - Cannot attach notes to unvalidated device data Resp - Cannot attach notes to unvalidated device data BP - Cannot attach notes to unvalidated device data SpO2 - Cannot attach notes to unvalidated device data  Hd completed  

## 2020-01-01 NOTE — Care Management Important Message (Signed)
Important Message  Patient Details  Name: [name] MRN: [id number] Date of Birth: 12-31-1960   Medicare Important Message Given:  Yes     Dannette Barbara 01/01/2020, 12:30 PM

## 2020-01-01 NOTE — Progress Notes (Signed)
Physical Therapy Treatment Patient Details Name: [name] MRN: [id number] DOB: Jan 26, 1961 Today's Date: 01/01/2020    History of Present Illness [name] is a 59 y.o. male past medical history significant for DM c multiple hyperglycemia admissions, multiple CVA, HTN, ETOH abuse, 2nd toe amputation (12/11/19), ESRD on HD TTS, [name]    PT Comments    Pt in bed, breakfast finished, awake, agreeable to participate. Pt more awake this date, reports to feel closer to baseline. Pt minimally interactive throughout session, seems only minimally motivated, but willing to participate to a small degree. He does well with AMB on RW, maintained on 5L/min, finishing at 91% SpO2. Pt wears 2L/min at baseline. Pt declines additional AMB. Very hesitant to go to recliner, but encouraged to be OOB for 2 hours daily, educated on risks of prolonged bed rest.     Follow Up Recommendations  Home health PT;Supervision for mobility/OOB     Equipment Recommendations  None recommended by PT    Recommendations for Other Services       Precautions / Restrictions Precautions Precautions: Fall Required Braces or Orthoses: Other Brace Other Brace: post op shoe Restrictions LLE Weight Bearing: Weight bearing as tolerated LLE Partial Weight Bearing Percentage or Pounds: heel weightbearing in postop shoe    Mobility  Bed Mobility Overal bed mobility: Modified Independent Bed Mobility: Supine to Sit     Supine to sit: Modified independent (Device/Increase time)        Transfers Overall transfer level: Modified independent Equipment used: Rolling walker (2 wheeled) Transfers: Sit to/from Stand Sit to Stand: Modified independent (Device/Increase time)         General transfer comment: no difficulty with balance  Ambulation/Gait Ambulation/Gait assistance: Min guard Gait  Distance (Feet): 80 Feet Assistive device: Rolling walker (2 wheeled) Gait Pattern/deviations: WFL(Within Functional Limits)     General Gait Details: patient ambulating with RW and post op shoe donned. Min guard for safety; maintained on 5L/min as received. 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, community Exercises - Lower Extremity Long Arc Quad: AROM;Both;15 reps;Seated;Limitations Long [employer] Limitations: ROM fairly limited bilat Hip Flexion/Marching: AROM;Both;Strengthening;15 reps;Seated;Limitations Hip Flexion/Marching Limitations: ROM somewhat limited, pt reports s chronic    General Comments        Pertinent Vitals/Pain Pain Assessment: No/denies pain    Home Living                      Prior Function  PT Goals (current goals can now be found in the care plan section) Acute Rehab PT Goals Patient Stated Goal: to return home PT Goal Formulation: With family Time For Goal Achievement: 01/12/20 Potential to Achieve Goals: Fair Progress towards PT goals: Progressing toward goals    Frequency    Min 2X/week      PT Plan Current plan remains appropriate    Co-evaluation              AM-PAC PT "6 Clicks" Mobility   Outcome Measure  Help needed turning from your back to your side while in a flat bed without using bedrails?: A Little Help needed moving from lying on your back to sitting  on the side of a flat bed without using bedrails?: A Little Help needed moving to and from a bed to a chair (including a wheelchair)?: A Little Help needed standing up from a chair using your arms (e.g., wheelchair or bedside chair)?: A Little Help needed to walk in hospital room?: A Lot Help needed climbing 3-5 steps with a railing? : A Lot 6 Click Score: 16    End of Session Equipment Utilized During Treatment: Gait belt;Other (comment) Activity Tolerance: Patient limited by fatigue;Patient tolerated treatment well;No increased pain Patient left: in chair;with chair alarm set;with call bell/phone within reach Nurse Communication: Mobility status PT Visit Diagnosis: Unsteadiness on feet (R26.81);Difficulty in walking, not elsewhere classified (R26.2);Other abnormalities of gait and mobility (R26.89) Pain - Right/Left: Left Pain - part of body: Ankle and joints of foot     Time: 6269-4854 PT Time Calculation (min) (ACUTE ONLY): 24 min  Charges:  $Therapeutic Exercise: 23-37 mins                     1:26 PM, 01/01/20 [name], PT, DPT Physical Therapist - [facility]  [phone] ([place])     [place] C 01/01/2020, 1:24 PM

## 2020-01-01 NOTE — Anesthesia Postprocedure Evaluation (Signed)
Anesthesia Post Note  Patient: MAURI TOLEN  Procedure(s) Performed: ESOPHAGOGASTRODUODENOSCOPY (EGD) WITH PROPOFOL (N/A )  Patient location during evaluation: Endoscopy Anesthesia Type: General Level of consciousness: awake and alert and oriented Pain management: pain level controlled Vital Signs Assessment: post-procedure vital signs reviewed and stable Respiratory status: spontaneous breathing, nonlabored ventilation and respiratory function stable Cardiovascular status: blood pressure returned to baseline and stable Postop Assessment: no signs of nausea or vomiting Anesthetic complications: no   No complications documented.   Last Vitals:  Vitals:   12/31/19 2100 01/01/20 0506  BP:  140/79  Pulse:  85  Resp:  20  Temp:  36.7 C  SpO2: 91% 92%    Last Pain:  Vitals:   12/31/19 2100  TempSrc:   PainSc: 0-No pain                 Adrin Julian

## 2020-01-01 NOTE — Progress Notes (Signed)
PROGRESS NOTE    [name]  GDJ:[id number] DOB: 1960/09/22 DOA: 12/24/2019 PCP: [name], MD   Chief complaint.  Shortness of breath.  Brief Narrative: [name] a 59 y.o.AA malepast medical history significant for diabetes, stroke, hypertension, history of alcohol use, [name],Thursday and Saturday,[name] Hemoglobin was at 6. Patient reports a history of shortness of breath on exertion for the last 3 to 4 months. He also reported history of episode ofblack stool a week prior to admission.  8/14.Hemoglobin continued drop to 7.7. We will keep patient for 1 more day .  EGD performed on 8/13 did not show any abnormality or active bleeding.  8/15.Patient developed significant bronchospasm overnight,was placed on high flow oxygen. Treated with DuoNeb for bronchospasm. Procalcitonin level not significantly elevated.   Scheduled for extra hemodialysis tomorrow.  8/16.  Patient still has significant hypoxemia after hemodialysis.  Consider aspiration pneumonia.  Start Unasyn.  Start prophylactic heparin.  8/17.  CT angiogram of the chest to rule out PE, consistent with bilateral lower lobe pneumonia.  Probable aspiration pneumonia, obtain speech therapy evaluation, continue Unasyn.  Still on 5 L oxygen today.    Assessment & Plan:   Active Problems:   Iron deficiency anemia   Uncontrolled diabetes mellitus (HCC)   CVA (cerebral vascular accident) ([place])   Type 2 diabetes mellitus without complication, with long-term current use of insulin ([place])   ESRD (end stage renal disease) ([place])   Anemia   Acute on chronic respiratory failure with hypoxemia (HCC)   Aspiration pneumonia of both lower lobes due to gastric secretions ([place])  #1.  Acute on chronic hypoxemic respiratory failure. Initially thought  was due to volume overload.  Procalcitonin level was not significant elevated.  Condition did not improve after dialysis.  CT angiogram ruled out PE, consistent with aspiration pneumonia.  Patient was on 2 L oxygen before admission.   --O2 up to 5L. --Wean O2 as able --treat underlying causes  # Pulm edema --repeat CXR 8/19 showed worsening "vascular congestion, extensive interstitial and ground-glass disease most likely pulmonary edema. There are at least small bilateral effusions."  --requesting extra dialysis session today for fluid removal  2.  Aspiration pneumonia at the bilateral lower lobes ruled in. Patient appeared to have some difficulty clear up upper airway.   --completed 5 days of Unasyn --dysphagia 3 with thin liquids, per SLP  3.  Severe iron deficient anemia. Condition has been stable for the last few days.  Received IV iron.  EGD did not show any active bleeding. --continue oral iron suppl  4.  End-stage renal disease. --requested extra volume removal  5.  Moderate protein calorie malnutrition.   --continue Glucerna  6.  Uncontrolled type 2 diabetes with hyperglycemia with frequent hypoglycemia. --very labile BG with extreme highs and lows.  Even SSI appeared to persist longer than usual. --change to very sensitive SSI, no bed-time coverage, reduce meal-time to 3u TID and resume Lantus at lower dose of 4u daily.  7.  Recent left second toe amputation. Evaluated by podiatry, no infection.   DVT prophylaxis: Heparin SQ Code Status: Full code  Family Communication:  Status is: inpatient Dispo:   The patient is from: home Anticipated d/c is to: home with [facility] Anticipated d/c date is: undertermined Patient currently is not medically stable to d/c due to: increased O2 requirement  to 5L, worsening pulm edema in a dialysis pt.    Consultants:  Nephrology  Procedures:HD  Antimicrobials:None     Subjective: Pt had no complaints.  BG extremely  labile.  Requesting extra dialysis session for fluid removal.   Objective: Vitals:   01/01/20 1715 01/01/20 1730 01/01/20 1745 01/01/20 1800  BP: (!) 144/81 (!) 150/80  (!) 142/79  Pulse: 85 89 85 82  Resp: (!) 21 18 17 16   Temp:      TempSrc:      SpO2:  97% (!) 86% 91%  Weight:      Height:        Intake/Output Summary (Last 24 hours) at 01/01/2020 1817 Last data filed at 01/01/2020 1000 Gross per 24 hour  Intake 440 ml  Output --  Net 440 ml   Filed Weights   12/24/19 1208 12/25/19 1328  Weight: 61.2 kg 62.5 kg    Examination:  Constitutional: NAD, AAOx3, sitting on BSC HEENT: conjunctivae and lids normal, EOMI CV: RRR no M,R,G. Distal pulses +2.  No cyanosis.   RESP: reduced lung sounds at posterior bases, normal respiratory effort, on 5L GI: +BS, NTND Extremities: No effusions, edema, or tenderness in BLE SKIN: warm, dry and intact Neuro: II - XII grossly intact.      Data Reviewed: I have personally reviewed following labs and imaging studies  CBC: Recent Labs  Lab 12/27/19 0503 12/27/19 0503 12/28/19 3953 12/29/19 0430 12/30/19 0436 12/31/19 0403 01/01/20 0436  WBC 4.7   < > 6.1 5.7 5.4 5.5 4.8  NEUTROABS 2.8  --  3.9 3.9 3.7  --   --   HGB 7.9*   < > 8.4* 7.9* 7.8* 8.5* 8.2*  HCT 24.2*   < > 25.5* 24.0* 25.1* 26.2* 26.4*  MCV 93.1   < > 93.1 93.4 97.7 94.9 98.1  PLT 244   < > 246 219 219 225 194   < > = values in this interval not displayed.   Basic Metabolic Panel: Recent Labs  Lab 12/26/19 0938 12/26/19 0938 12/28/19 0621 12/29/19 0430 12/30/19 0436 12/31/19 0403 01/01/20 0436 01/01/20 1624  NA 137  --   --  135 139 141 136  --   K 4.1  --   --  3.4* 4.1 4.8 4.1  --   CL 98  --   --  95* 99 103 96*  --   CO2 26  --   --  28 28 25 28   --   GLUCOSE 492*   < >  --  117* 64* 56* 327* 158*  BUN 34*  --   --  21* 31* 37* 19  --   CREATININE 4.56*   < > 4.27* 3.11* 4.42* 5.11* 3.56*  --   CALCIUM 8.0*  --   --  7.9* 8.3* 8.4* 8.1*  --    MG  --   --   --   --   --  2.4 2.0  --   PHOS 6.6*  --   --   --   --   --   --   --    < > = values in this interval not displayed.   GFR: Estimated Creatinine Clearance: 19.5 mL/min (A) (by C-G formula based on SCr of 3.56 mg/dL (H)). Liver Function Tests: Recent Labs  Lab 12/26/19 0938  AST 43*  ALT 30  ALKPHOS 178*  BILITOT 0.7  PROT 6.1*  ALBUMIN 2.5*  2.5*  No results for input(s): LIPASE, AMYLASE in the last 168 hours. No results for input(s): AMMONIA in the last 168 hours. Coagulation Profile: No results for input(s): INR, PROTIME in the last 168 hours. Cardiac Enzymes: No results for input(s): CKTOTAL, CKMB, CKMBINDEX, TROPONINI in the last 168 hours. BNP (last 3 results) No results for input(s): PROBNP in the last 8760 hours. HbA1C: No results for input(s): HGBA1C in the last 72 hours. CBG: Recent Labs  Lab 12/31/19 1736 12/31/19 2134 01/01/20 0143 01/01/20 0743 01/01/20 1152  GLUCAP 132* 358* 322* 297* 505*   Lipid Profile: No results for input(s): CHOL, HDL, LDLCALC, TRIG, CHOLHDL, LDLDIRECT in the last 72 hours. Thyroid Function Tests: No results for input(s): TSH, T4TOTAL, FREET4, T3FREE, THYROIDAB in the last 72 hours. Anemia Panel: No results for input(s): VITAMINB12, FOLATE, FERRITIN, TIBC, IRON, RETICCTPCT in the last 72 hours. Sepsis Labs: Recent Labs  Lab 12/27/19 0503 12/31/19 0403 01/01/20 0436  PROCALCITON 0.48 1.77 1.44    Recent Results (from the past 240 hour(s))  Gastrointestinal Panel by PCR , Stool     Status: None   Collection Time: 12/24/19  5:12 PM   Specimen: Stool  Result Value Ref Range Status   Campylobacter species NOT DETECTED NOT DETECTED Final   Plesimonas shigelloides NOT DETECTED NOT DETECTED Final   Salmonella species NOT DETECTED NOT DETECTED Final   Yersinia enterocolitica NOT DETECTED NOT DETECTED Final   Vibrio species NOT DETECTED NOT DETECTED Final   Vibrio cholerae NOT DETECTED NOT DETECTED Final    Enteroaggregative E coli (EAEC) NOT DETECTED NOT DETECTED Final   Enteropathogenic E coli (EPEC) NOT DETECTED NOT DETECTED Final   Enterotoxigenic E coli (ETEC) NOT DETECTED NOT DETECTED Final   Shiga like toxin producing E coli (STEC) NOT DETECTED NOT DETECTED Final   Shigella/Enteroinvasive E coli (EIEC) NOT DETECTED NOT DETECTED Final   Cryptosporidium NOT DETECTED NOT DETECTED Final   Cyclospora cayetanensis NOT DETECTED NOT DETECTED Final   Entamoeba histolytica NOT DETECTED NOT DETECTED Final   Giardia lamblia NOT DETECTED NOT DETECTED Final   Adenovirus F40/41 NOT DETECTED NOT DETECTED Final   Astrovirus NOT DETECTED NOT DETECTED Final   Norovirus GI/GII NOT DETECTED NOT DETECTED Final   Rotavirus A NOT DETECTED NOT DETECTED Final   Sapovirus (I, II, IV, and V) NOT DETECTED NOT DETECTED Final    Comment: Performed at [facility], [address] [place], [place] [address]  C Difficile Quick Screen w PCR reflex     Status: None   Collection Time: 12/24/19  5:12 PM   Specimen: STOOL  Result Value Ref Range Status   C Diff antigen NEGATIVE NEGATIVE Final   C Diff toxin NEGATIVE NEGATIVE Final   C Diff interpretation No C. difficile detected.  Final    Comment: Performed at [facility], [address] [place], [place] [address]  SARS Coronavirus 2 by RT PCR (hospital order, performed in [facility] hospital lab) Nasopharyngeal Nasopharyngeal Swab     Status: None   Collection Time: 12/24/19  5:12 PM   Specimen: Nasopharyngeal Swab  Result Value Ref Range Status   SARS Coronavirus 2 NEGATIVE NEGATIVE Final    Comment: (NOTE) SARS-CoV-2 target nucleic acids are NOT DETECTED.  The SARS-CoV-2 RNA is generally detectable in upper and lower respiratory specimens during the acute phase of infection. The lowest concentration of SARS-CoV-2 viral copies this assay can detect is 250 copies / mL. A negative result does not preclude SARS-CoV-2 infection  and should not  be used as the sole basis for treatment or other patient management decisions.  A negative result may occur with improper specimen collection / handling, submission of specimen other than nasopharyngeal swab, presence of viral mutation(s) within the areas targeted by this assay, and inadequate number of viral copies (<250 copies / mL). A negative result must be combined with clinical observations, patient history, and epidemiological information.  Fact Sheet for Patients:   StrictlyIdeas.no  Fact Sheet for Healthcare Providers: BankingDealers.co.za  This test is not yet approved or  cleared by the [place] FDA and has been authorized for detection and/or diagnosis of SARS-CoV-2 by FDA under an Emergency Use Authorization (EUA).  This EUA will remain in effect (meaning this test can be used) for the duration of the COVID-19 declaration under Section 564(b)(1) of the Act, 21 U.S.C. section 360bbb-3(b)(1), unless the authorization is terminated or revoked sooner.  Performed at [facility], [address] [place], [place] [address]          Radiology Studies: DG Chest [place] 1 View  Result Date: 12/31/2019 CLINICAL DATA:  Hypoxia EXAM: PORTABLE CHEST 1 VIEW COMPARISON:  12/27/2018, CT chest 12/28/2018 FINDINGS: Right-sided central venous catheter tip over the distal SVC. Cardiomegaly with at least small bilateral effusions. Vascular congestion with extensive interstitial and ground-glass disease most likely pulmonary edema. Bibasilar consolidations. Aortic atherosclerosis. No pneumothorax. IMPRESSION: Cardiomegaly with vascular congestion, extensive interstitial and ground-glass disease most likely pulmonary edema. There are at least small bilateral effusions. Bibasilar consolidations. Electronically Signed   By: Donavan Foil M.D.   On: 12/31/2019 19:49        Scheduled Meds: . aspirin EC  81 mg Oral Daily  .  carvedilol  6.25 mg Oral BID WC  . Chlorhexidine Gluconate Cloth  6 each Topical Daily  . epoetin (EPOGEN/PROCRIT) injection  4,000 Units Intravenous Q T,Th,Sa-HD  . feeding supplement (GLUCERNA SHAKE)  237 mL Oral BID BM  . folic acid  1 mg Oral Daily  . heparin  5,000 Units Subcutaneous Q8H  . hydrALAZINE  25 mg Oral BID  . insulin aspart  0-15 Units Subcutaneous TID WC  . insulin aspart  0-5 Units Subcutaneous QHS  . insulin aspart  10 Units Subcutaneous TID WC  . iron polysaccharides  150 mg Oral Daily  . melatonin  2.5 mg Oral QHS  . multivitamin  1 tablet Oral Daily  . nicotine  7 mg Transdermal Daily  . pantoprazole (PROTONIX) IV  40 mg Intravenous Q12H  . sertraline  100 mg Oral Daily  . thiamine  100 mg Oral Daily   Continuous Infusions:    LOS: 7 days     Enzo Bi, MD [facility]   To contact the attending provider between 7A-7P or the covering provider during after hours 7P-7A, please log into the web site [email or web address] and access using universal [place] password for that web site. If you do not have the password, please call the hospital operator.  01/01/2020, 6:17 PM

## 2020-01-01 NOTE — Progress Notes (Signed)
FSBS now 259.

## 2020-01-02 ENCOUNTER — Inpatient Hospital Stay: Payer: Medicare Other

## 2020-01-02 LAB — GLUCOSE, CAPILLARY
Glucose-Capillary: 310 mg/dL — ABNORMAL HIGH (ref 70–99)
Glucose-Capillary: 42 mg/dL — CL (ref 70–99)
Glucose-Capillary: 128 mg/dL — ABNORMAL HIGH (ref 70–99)
Glucose-Capillary: 279 mg/dL — ABNORMAL HIGH (ref 70–99)
Glucose-Capillary: 365 mg/dL — ABNORMAL HIGH (ref 70–99)

## 2020-01-02 LAB — MAGNESIUM: Magnesium: 2 mg/dL (ref 1.7–2.4)

## 2020-01-02 LAB — CBC
HCT: 26.7 % — ABNORMAL LOW (ref 39.0–52.0)
Hemoglobin: 8.7 g/dL — ABNORMAL LOW (ref 13.0–17.0)
MCH: 30.7 pg (ref 26.0–34.0)
MCHC: 32.6 g/dL (ref 30.0–36.0)
MCV: 94.3 fL (ref 80.0–100.0)
Platelets: 202 10*3/uL (ref 150–400)
RBC: 2.83 MIL/uL — ABNORMAL LOW (ref 4.22–5.81)
RDW: 16.1 % — ABNORMAL HIGH (ref 11.5–15.5)
WBC: 4.6 10*3/uL (ref 4.0–10.5)
nRBC: 0 % (ref 0.0–0.2)

## 2020-01-02 LAB — BASIC METABOLIC PANEL
Anion gap: 15 (ref 5–15)
BUN: 24 mg/dL — ABNORMAL HIGH (ref 6–20)
CO2: 27 mmol/L (ref 22–32)
Calcium: 8.2 mg/dL — ABNORMAL LOW (ref 8.9–10.3)
Chloride: 98 mmol/L (ref 98–111)
Creatinine, Ser: 4.37 mg/dL — ABNORMAL HIGH (ref 0.61–1.24)
GFR calc Af Amer: 16 mL/min — ABNORMAL LOW (ref >60)
GFR calc non Af Amer: 14 mL/min — ABNORMAL LOW (ref >60)
Glucose, Bld: 315 mg/dL — ABNORMAL HIGH (ref 70–99)
Potassium: 4 mmol/L (ref 3.5–5.1)
Sodium: 140 mmol/L (ref 135–145)

## 2020-01-02 LAB — PROCALCITONIN: Procalcitonin: 1.63 ng/mL

## 2020-01-02 NOTE — Progress Notes (Signed)
PROGRESS NOTE    [name]  RFF:[id number] DOB: 1961-04-22 DOA: 12/24/2019 PCP: [name], MD   Chief complaint.  Shortness of breath.  Brief Narrative: [name] a 59 y.o.AA malepast medical history significant for diabetes, stroke, hypertension, history of alcohol use, [name],Thursday and Saturday,[name] Hemoglobin was at 6. Patient reports a history of shortness of breath on exertion for the last 3 to 4 months. He also reported history of episode ofblack stool a week prior to admission.  8/14.Hemoglobin continued drop to 7.7. We will keep patient for 1 more day .  EGD performed on 8/13 did not show any abnormality or active bleeding.  8/15.Patient developed significant bronchospasm overnight,was placed on high flow oxygen. Treated with DuoNeb for bronchospasm. Procalcitonin level not significantly elevated.   Scheduled for extra hemodialysis tomorrow.  8/16.  Patient still has significant hypoxemia after hemodialysis.  Consider aspiration pneumonia.  Start Unasyn.  Start prophylactic heparin.  8/17.  CT angiogram of the chest to rule out PE, consistent with bilateral lower lobe pneumonia.  Probable aspiration pneumonia, obtain speech therapy evaluation, continue Unasyn.  Still on 5 L oxygen today.    Assessment & Plan:   Active Problems:   Iron deficiency anemia   Uncontrolled diabetes mellitus (HCC)   CVA (cerebral vascular accident) ([place])   Type 2 diabetes mellitus without complication, with long-term current use of insulin ([place])   ESRD (end stage renal disease) ([place])   Anemia   Acute on chronic respiratory failure with hypoxemia (HCC)   Aspiration pneumonia of both lower lobes due to gastric secretions ([place])  #1.  Acute on chronic hypoxemic respiratory failure. Initially thought  was due to volume overload.  Procalcitonin level was not significant elevated.  Condition did not improve after dialysis.  CT angiogram ruled out PE, consistent with aspiration pneumonia.  Patient was on 2 L oxygen before admission.   --O2 requirement slightly improved, from 5L down to 4L --Wean O2 as able --treat underlying causes  # Pulm edema --repeat CXR 8/19 showed worsening "vascular congestion, extensive interstitial and ground-glass disease most likely pulmonary edema. There are at least small bilateral effusions."  --dialysis today for fluid removal  2.  Aspiration pneumonia at the bilateral lower lobes ruled in. Patient appeared to have some difficulty clear up upper airway.   --completed 5 days of Unasyn --dysphagia 3 with thin liquids, per SLP  3.  Severe iron deficient anemia. Condition has been stable for the last few days.  Received IV iron.  EGD did not show any active bleeding. --Hgb stable in 8's --continue oral iron suppl  4.  End-stage renal disease on HD TTS --dialysis today  5.  Moderate protein calorie malnutrition.   --continue Glucerna  6.  Uncontrolled type 2 diabetes with hyperglycemia with frequent hypoglycemia. --very labile BG with extreme highs and lows.  Even SSI appeared to persist longer than usual. --changed to very sensitive SSI, reduce meal-time to 3u TID and resume Lantus at lower dose of 4u daily, per diabetic educator rec PLAN: --continue current insulin regimen --No bed-time SSI  7.  Recent left second toe amputation. Evaluated by podiatry, no infection.   DVT prophylaxis: Heparin SQ Code Status: Full code  Family Communication: pt does not want me to contact his wife Status is: inpatient Dispo:   The patient is from: home Anticipated  d/c is to: home with [facility] Anticipated d/c date is: undertermined Patient currently is not medically stable to d/c due to: increased O2 requirement up to 5L, persistent pulm edema in a dialysis  pt.    Consultants:  Nephrology  Procedures:HD  Antimicrobials:None     Subjective: Pt reported breathing was better, appetite better.  Dialysis today.   Objective: Vitals:   01/01/20 2229 01/02/20 0527 01/02/20 0840 01/02/20 1407  BP: (!) 142/73 136/81 134/74 101/72  Pulse: 77 95  89  Resp: 20   18  Temp: 97.8 F (36.6 C) 97.8 F (36.6 C) 97.7 F (36.5 C)   TempSrc: Oral Oral Oral   SpO2: 94% 93%  100%  Weight:      Height:        Intake/Output Summary (Last 24 hours) at 01/02/2020 1705 Last data filed at 01/02/2020 1230 Gross per 24 hour  Intake --  Output 3910 ml  Net -3910 ml   Filed Weights   12/24/19 1208 12/25/19 1328  Weight: 61.2 kg 62.5 kg    Examination:  Constitutional: NAD, AAOx3 HEENT: conjunctivae and lids normal, EOMI CV: RRR no M,R,G. Distal pulses +2.  No cyanosis.   RESP: CTA B/L over anterior, normal respiratory effort, 4L GI: +BS, NTND Extremities: No effusions, edema, or tenderness in BLE SKIN: warm, dry and intact Neuro: II - XII grossly intact.  Sensation intact    Data Reviewed: I have personally reviewed following labs and imaging studies  CBC: Recent Labs  Lab 12/27/19 0503 12/27/19 0503 12/28/19 3500 12/28/19 9381 12/29/19 0430 12/30/19 0436 12/31/19 0403 01/01/20 0436 01/02/20 0450  WBC 4.7   < > 6.1   < > 5.7 5.4 5.5 4.8 4.6  NEUTROABS 2.8  --  3.9  --  3.9 3.7  --   --   --   HGB 7.9*   < > 8.4*   < > 7.9* 7.8* 8.5* 8.2* 8.7*  HCT 24.2*   < > 25.5*   < > 24.0* 25.1* 26.2* 26.4* 26.7*  MCV 93.1   < > 93.1   < > 93.4 97.7 94.9 98.1 94.3  PLT 244   < > 246   < > 219 219 225 194 202   < > = values in this interval not displayed.   Basic Metabolic Panel: Recent Labs  Lab 12/29/19 0430 12/29/19 0430 12/30/19 0436 12/31/19 0403 01/01/20 0436 01/01/20 1624 01/02/20 0450  NA 135  --  139 141 136  --  140  K 3.4*  --  4.1 4.8 4.1  --  4.0  CL 95*  --  99 103 96*  --  98  CO2 28  --  28 25 28   --   27  GLUCOSE 117*   < > 64* 56* 327* 158* 315*  BUN 21*  --  31* 37* 19  --  24*  CREATININE 3.11*  --  4.42* 5.11* 3.56*  --  4.37*  CALCIUM 7.9*  --  8.3* 8.4* 8.1*  --  8.2*  MG  --   --   --  2.4 2.0  --  2.0   < > = values in this interval not displayed.   GFR: Estimated Creatinine Clearance: 15.9 mL/min (A) (by C-G formula based on SCr of 4.37 mg/dL (H)). Liver Function Tests: No results for input(s): AST, ALT, ALKPHOS, BILITOT, PROT, ALBUMIN in the last 168 hours. No results for input(s): LIPASE, AMYLASE in the last 168 hours. No results  for input(s): AMMONIA in the last 168 hours. Coagulation Profile: No results for input(s): INR, PROTIME in the last 168 hours. Cardiac Enzymes: No results for input(s): CKTOTAL, CKMB, CKMBINDEX, TROPONINI in the last 168 hours. BNP (last 3 results) No results for input(s): PROBNP in the last 8760 hours. HbA1C: No results for input(s): HGBA1C in the last 72 hours. CBG: Recent Labs  Lab 01/01/20 2207 01/02/20 0801 01/02/20 1319 01/02/20 1406 01/02/20 1700  GLUCAP 180* 310* 42* 128* 279*   Lipid Profile: No results for input(s): CHOL, HDL, LDLCALC, TRIG, CHOLHDL, LDLDIRECT in the last 72 hours. Thyroid Function Tests: No results for input(s): TSH, T4TOTAL, FREET4, T3FREE, THYROIDAB in the last 72 hours. Anemia Panel: No results for input(s): VITAMINB12, FOLATE, FERRITIN, TIBC, IRON, RETICCTPCT in the last 72 hours. Sepsis Labs: Recent Labs  Lab 12/27/19 0503 12/31/19 0403 01/01/20 0436 01/02/20 0450  PROCALCITON 0.48 1.77 1.44 1.63    Recent Results (from the past 240 hour(s))  Gastrointestinal Panel by PCR , Stool     Status: None   Collection Time: 12/24/19  5:12 PM   Specimen: Stool  Result Value Ref Range Status   Campylobacter species NOT DETECTED NOT DETECTED Final   Plesimonas shigelloides NOT DETECTED NOT DETECTED Final   Salmonella species NOT DETECTED NOT DETECTED Final   Yersinia enterocolitica NOT DETECTED NOT  DETECTED Final   Vibrio species NOT DETECTED NOT DETECTED Final   Vibrio cholerae NOT DETECTED NOT DETECTED Final   Enteroaggregative E coli (EAEC) NOT DETECTED NOT DETECTED Final   Enteropathogenic E coli (EPEC) NOT DETECTED NOT DETECTED Final   Enterotoxigenic E coli (ETEC) NOT DETECTED NOT DETECTED Final   Shiga like toxin producing E coli (STEC) NOT DETECTED NOT DETECTED Final   Shigella/Enteroinvasive E coli (EIEC) NOT DETECTED NOT DETECTED Final   Cryptosporidium NOT DETECTED NOT DETECTED Final   Cyclospora cayetanensis NOT DETECTED NOT DETECTED Final   Entamoeba histolytica NOT DETECTED NOT DETECTED Final   Giardia lamblia NOT DETECTED NOT DETECTED Final   Adenovirus F40/41 NOT DETECTED NOT DETECTED Final   Astrovirus NOT DETECTED NOT DETECTED Final   Norovirus GI/GII NOT DETECTED NOT DETECTED Final   Rotavirus A NOT DETECTED NOT DETECTED Final   Sapovirus (I, II, IV, and V) NOT DETECTED NOT DETECTED Final    Comment: Performed at [facility], [address] [place], [place] [address]  C Difficile Quick Screen w PCR reflex     Status: None   Collection Time: 12/24/19  5:12 PM   Specimen: STOOL  Result Value Ref Range Status   C Diff antigen NEGATIVE NEGATIVE Final   C Diff toxin NEGATIVE NEGATIVE Final   C Diff interpretation No C. difficile detected.  Final    Comment: Performed at [facility], [address] [place], [place] [address]  SARS Coronavirus 2 by RT PCR (hospital order, performed in [facility] hospital lab) Nasopharyngeal Nasopharyngeal Swab     Status: None   Collection Time: 12/24/19  5:12 PM   Specimen: Nasopharyngeal Swab  Result Value Ref Range Status   SARS Coronavirus 2 NEGATIVE NEGATIVE Final    Comment: (NOTE) SARS-CoV-2 target nucleic acids are NOT DETECTED.  The SARS-CoV-2 RNA is generally detectable in upper and lower respiratory specimens during the acute phase of infection. The lowest concentration of SARS-CoV-2 viral  copies this assay can detect is 250 copies / mL. A negative result does not preclude SARS-CoV-2 infection and should not be used as the sole basis  for treatment or other patient management decisions.  A negative result may occur with improper specimen collection / handling, submission of specimen other than nasopharyngeal swab, presence of viral mutation(s) within the areas targeted by this assay, and inadequate number of viral copies (<250 copies / mL). A negative result must be combined with clinical observations, patient history, and epidemiological information.  Fact Sheet for Patients:   StrictlyIdeas.no  Fact Sheet for Healthcare Providers: BankingDealers.co.za  This test is not yet approved or  cleared by the [place] FDA and has been authorized for detection and/or diagnosis of SARS-CoV-2 by FDA under an Emergency Use Authorization (EUA).  This EUA will remain in effect (meaning this test can be used) for the duration of the COVID-19 declaration under Section 564(b)(1) of the Act, 21 U.S.C. section 360bbb-3(b)(1), unless the authorization is terminated or revoked sooner.  Performed at [facility] : [facility], [address] [place], [place] [address]          Radiology Studies: [facility] Chest [place] 1 View  Result Date: 01/02/2020 CLINICAL DATA:  Shortness of breath. EXAM: PORTABLE CHEST 1 VIEW COMPARISON:  12/31/2019 and prior studies FINDINGS: Cardiomegaly and diffuse bilateral airspace/interstitial opacities again noted. Small bilateral pleural effusions are unchanged. A RIGHT IJ central venous catheter is again noted with tip overlying the SUPERIOR cavoatrial junction. No pneumothorax noted. IMPRESSION: Unchanged appearance of the chest with diffuse bilateral airspace/interstitial opacities and small bilateral pleural effusions. Electronically Signed   By: Margarette Canada M.D.   On: 01/02/2020 11:28   DG Chest Port 1  View  Result Date: 12/31/2019 CLINICAL DATA:  Hypoxia EXAM: PORTABLE CHEST 1 VIEW COMPARISON:  12/27/2018, CT chest 12/28/2018 FINDINGS: Right-sided central venous catheter tip over the distal SVC. Cardiomegaly with at least small bilateral effusions. Vascular congestion with extensive interstitial and ground-glass disease most likely pulmonary edema. Bibasilar consolidations. Aortic atherosclerosis. No pneumothorax. IMPRESSION: Cardiomegaly with vascular congestion, extensive interstitial and ground-glass disease most likely pulmonary edema. There are at least small bilateral effusions. Bibasilar consolidations. Electronically Signed   By: Donavan Foil M.D.   On: 12/31/2019 19:49        Scheduled Meds: . aspirin EC  81 mg Oral Daily  . carvedilol  6.25 mg Oral BID WC  . Chlorhexidine Gluconate Cloth  6 each Topical Daily  . epoetin (EPOGEN/PROCRIT) injection  4,000 Units Intravenous Q T,Th,Sa-HD  . feeding supplement (GLUCERNA SHAKE)  237 mL Oral BID BM  . folic acid  1 mg Oral Daily  . heparin  5,000 Units Subcutaneous Q8H  . hydrALAZINE  25 mg Oral BID  . insulin aspart  0-6 Units Subcutaneous TID WC  . insulin aspart  3 Units Subcutaneous TID WC  . insulin glargine  4 Units Subcutaneous Daily  . iron polysaccharides  150 mg Oral Daily  . melatonin  2.5 mg Oral QHS  . multivitamin  1 tablet Oral Daily  . nicotine  7 mg Transdermal Daily  . pantoprazole (PROTONIX) IV  40 mg Intravenous Q12H  . sertraline  100 mg Oral Daily  . thiamine  100 mg Oral Daily   Continuous Infusions:    LOS: 8 days     Enzo Bi, MD [facility]   To contact the attending provider between 7A-7P or the covering provider during after hours 7P-7A, please log into the web site [email or web address] and access using universal [place] password for that web site. If you do not have the password, please call the hospital operator.  01/02/2020,  5:05 PM

## 2020-01-02 NOTE — Progress Notes (Signed)
[facility], [place] 01/02/20  Subjective:   LOS: 8    Dyspnea on exertion and Low Hgb at admission,  No peripheral edema, Some SOB; requiring 4-5 L O2 Able to eat without nausea or vomiting    HEMODIALYSIS FLOWSHEET:  Blood Flow Rate (mL/min): 350 mL/min Arterial Pressure (mmHg): -150 mmHg Venous Pressure (mmHg): 100 mmHg Transmembrane Pressure (mmHg): 70 mmHg Ultrafiltration Rate (mL/min): 710 mL/min Dialysate Flow Rate (mL/min): 800 ml/min Conductivity: Machine : 13.7 Conductivity: Machine : 13.7 Dialysis Fluid Bolus: Normal Saline Bolus Amount (mL): 250 mL     Objective:  Vital signs in last 24 hours:  Temp:  [97.6 F (36.4 C)-98.2 F (36.8 C)] 97.7 F (36.5 C) (08/21 0840) Pulse Rate:  [77-100] 95 (08/21 0527) Resp:  [15-28] 20 (08/20 2229) BP: (134-182)/(73-106) 134/74 (08/21 0840) SpO2:  [86 %-97 %] 93 % (08/21 0527)  Weight change:  Filed Weights   12/24/19 1208 12/25/19 1328  Weight: 61.2 kg 62.5 kg    Intake/Output:    Intake/Output Summary (Last 24 hours) at 01/02/2020 1117 Last data filed at 01/01/2020 1845 Gross per 24 hour  Intake --  Output 1910 ml  Net -1910 ml   Physical Exam: General:  laying in bed, NAD  HEENT  Oral mucosa moist  Pulm/lungs   requiring 4-5 L O2 [place], decreased breath sounds at bases  CVS/Heart   S1S2,no rub or gallop  Abdomen:   Soft, nontender  Extremities:  No peripheral edema  Neurologic:  Alert, oriented  Skin:  No rashes or lesions noted  Rt IJ PC  Basic Metabolic Panel:  Recent Labs  Lab 12/29/19 0430 12/29/19 0430 12/30/19 0436 12/30/19 0436 12/31/19 0403 01/01/20 0436 01/01/20 1624 01/02/20 0450  NA 135  --  139  --  141 136  --  140  K 3.4*  --  4.1  --  4.8 4.1  --  4.0  CL 95*  --  99  --  103 96*  --  98  CO2 28  --  28  --  25 28  --  27  GLUCOSE 117*   < > 64*  --  56* 327* 158* 315*  BUN 21*  --  31*  --  37* 19  --  24*  CREATININE 3.11*  --  4.42*  --   5.11* 3.56*  --  4.37*  CALCIUM 7.9*   < > 8.3*   < > 8.4* 8.1*  --  8.2*  MG  --   --   --   --  2.4 2.0  --  2.0   < > = values in this interval not displayed.     CBC: Recent Labs  Lab 12/27/19 0503 12/27/19 0503 12/28/19 3235 12/28/19 5732 12/29/19 0430 12/30/19 0436 12/31/19 0403 01/01/20 0436 01/02/20 0450  WBC 4.7   < > 6.1   < > 5.7 5.4 5.5 4.8 4.6  NEUTROABS 2.8  --  3.9  --  3.9 3.7  --   --   --   HGB 7.9*   < > 8.4*   < > 7.9* 7.8* 8.5* 8.2* 8.7*  HCT 24.2*   < > 25.5*   < > 24.0* 25.1* 26.2* 26.4* 26.7*  MCV 93.1   < > 93.1   < > 93.4 97.7 94.9 98.1 94.3  PLT 244   < > 246   < > 219 219 225 194 202   < > = values in this  interval not displayed.      Lab Results  Component Value Date   HEPBSAG NON REACTIVE 12/06/2019   HEPBSAB NON REACTIVE 04/10/2019      Microbiology:  Recent Results (from the past 240 hour(s))  Gastrointestinal Panel by PCR , Stool     Status: None   Collection Time: 12/24/19  5:12 PM   Specimen: Stool  Result Value Ref Range Status   Campylobacter species NOT DETECTED NOT DETECTED Final   Plesimonas shigelloides NOT DETECTED NOT DETECTED Final   Salmonella species NOT DETECTED NOT DETECTED Final   Yersinia enterocolitica NOT DETECTED NOT DETECTED Final   Vibrio species NOT DETECTED NOT DETECTED Final   Vibrio cholerae NOT DETECTED NOT DETECTED Final   Enteroaggregative E coli (EAEC) NOT DETECTED NOT DETECTED Final   Enteropathogenic E coli (EPEC) NOT DETECTED NOT DETECTED Final   Enterotoxigenic E coli (ETEC) NOT DETECTED NOT DETECTED Final   Shiga like toxin producing E coli (STEC) NOT DETECTED NOT DETECTED Final   Shigella/Enteroinvasive E coli (EIEC) NOT DETECTED NOT DETECTED Final   Cryptosporidium NOT DETECTED NOT DETECTED Final   Cyclospora cayetanensis NOT DETECTED NOT DETECTED Final   Entamoeba histolytica NOT DETECTED NOT DETECTED Final   Giardia lamblia NOT DETECTED NOT DETECTED Final   Adenovirus F40/41 NOT DETECTED  NOT DETECTED Final   Astrovirus NOT DETECTED NOT DETECTED Final   Norovirus GI/GII NOT DETECTED NOT DETECTED Final   Rotavirus A NOT DETECTED NOT DETECTED Final   Sapovirus (I, II, IV, and V) NOT DETECTED NOT DETECTED Final    Comment: Performed at [facility], [address] [place], [place] [address]  C Difficile Quick Screen w PCR reflex     Status: None   Collection Time: 12/24/19  5:12 PM   Specimen: STOOL  Result Value Ref Range Status   C Diff antigen NEGATIVE NEGATIVE Final   C Diff toxin NEGATIVE NEGATIVE Final   C Diff interpretation No C. difficile detected.  Final    Comment: Performed at [facility], [address] [place], [place] [address]  SARS Coronavirus 2 by RT PCR (hospital order, performed in [facility] hospital lab) Nasopharyngeal Nasopharyngeal Swab     Status: None   Collection Time: 12/24/19  5:12 PM   Specimen: Nasopharyngeal Swab  Result Value Ref Range Status   SARS Coronavirus 2 NEGATIVE NEGATIVE Final    Comment: (NOTE) SARS-CoV-2 target nucleic acids are NOT DETECTED.  The SARS-CoV-2 RNA is generally detectable in upper and lower respiratory specimens during the acute phase of infection. The lowest concentration of SARS-CoV-2 viral copies this assay can detect is 250 copies / mL. A negative result does not preclude SARS-CoV-2 infection and should not be used as the sole basis for treatment or other patient management decisions.  A negative result may occur with improper specimen collection / handling, submission of specimen other than nasopharyngeal swab, presence of viral mutation(s) within the areas targeted by this assay, and inadequate number of viral copies (<250 copies / mL). A negative result must be combined with clinical observations, patient history, and epidemiological information.  Fact Sheet for Patients:   StrictlyIdeas.no  Fact Sheet for Healthcare  Providers: BankingDealers.co.za  This test is not yet approved or  cleared by the [place] FDA and has been authorized for detection and/or diagnosis of SARS-CoV-2 by FDA under an Emergency Use Authorization (EUA).  This EUA will remain in effect (meaning this test can be used) for the duration of  the COVID-19 declaration under Section 564(b)(1) of the Act, 21 U.S.C. section 360bbb-3(b)(1), unless the authorization is terminated or revoked sooner.  Performed at [facility], [address] [place], [place] [address]     Coagulation Studies: No results for input(s): LABPROT, INR in the last 72 hours.  Urinalysis: No results for input(s): COLORURINE, LABSPEC, PHURINE, GLUCOSEU, HGBUR, BILIRUBINUR, KETONESUR, PROTEINUR, UROBILINOGEN, NITRITE, LEUKOCYTESUR in the last 72 hours.  Invalid input(s): APPERANCEUR    Imaging: DG Chest Port 1 View  Result Date: 12/31/2019 CLINICAL DATA:  Hypoxia EXAM: PORTABLE CHEST 1 VIEW COMPARISON:  12/27/2018, CT chest 12/28/2018 FINDINGS: Right-sided central venous catheter tip over the distal SVC. Cardiomegaly with at least small bilateral effusions. Vascular congestion with extensive interstitial and ground-glass disease most likely pulmonary edema. Bibasilar consolidations. Aortic atherosclerosis. No pneumothorax. IMPRESSION: Cardiomegaly with vascular congestion, extensive interstitial and ground-glass disease most likely pulmonary edema. There are at least small bilateral effusions. Bibasilar consolidations. Electronically Signed   By: Donavan Foil M.D.   On: 12/31/2019 19:49     Medications:    . aspirin EC  81 mg Oral Daily  . carvedilol  6.25 mg Oral BID WC  . Chlorhexidine Gluconate Cloth  6 each Topical Daily  . epoetin (EPOGEN/PROCRIT) injection  4,000 Units Intravenous Q T,Th,Sa-HD  . feeding supplement (GLUCERNA SHAKE)  237 mL Oral BID BM  . folic acid  1 mg Oral Daily  . heparin  5,000 Units  Subcutaneous Q8H  . hydrALAZINE  25 mg Oral BID  . insulin aspart  0-6 Units Subcutaneous TID WC  . insulin aspart  3 Units Subcutaneous TID WC  . insulin glargine  4 Units Subcutaneous Daily  . iron polysaccharides  150 mg Oral Daily  . melatonin  2.5 mg Oral QHS  . multivitamin  1 tablet Oral Daily  . nicotine  7 mg Transdermal Daily  . pantoprazole (PROTONIX) IV  40 mg Intravenous Q12H  . sertraline  100 mg Oral Daily  . thiamine  100 mg Oral Daily   acetaminophen **OR** acetaminophen, diphenhydrAMINE, ipratropium-albuterol, ondansetron **OR** ondansetron (ZOFRAN) IV  Assessment/ Plan:  59 y.o. male with  was admitted on 12/24/2019 for  Active Problems:   Iron deficiency anemia   Uncontrolled diabetes mellitus ([place])   CVA (cerebral vascular accident) ([place])   Type 2 diabetes mellitus without complication, with long-term current use of insulin ([place])   ESRD (end stage renal disease) ([place])   Anemia   Acute on chronic respiratory failure with hypoxemia (HCC)   Aspiration pneumonia of both lower lobes due to gastric secretions (HCC)  Cough [R05] Weakness [R53.1] Anemia [D64.9] Symptomatic anemia [D64.9] Diarrhea, unspecified type [R19.7]  [facility] /TuThSa-1// TW//61.5kg  #. ESRD with shortness of breath -Patient seen during dialysis Tolerating well  -1900 cc of fluid removed with HD yesterday -Wean down oxygen after fluid removal with dialysis today   CT angiogram of chest 8/16 - moderate cardiomegaly and pleural effusions   #. Anemia of CKD  Lab Results  Component Value Date   HGB 8.7 (L) 01/02/2020   Low dose EPO with HD  #. Secondary hyperparathyroidism of renal origin N 25.81   No results found for: PTH Lab Results  Component Value Date   PHOS 6.6 (H) 12/26/2019   Monitor calcium and phos level during this admission   #. Diabetes type 2 with CKD Hemoglobin A1C (%)  Date Value  01/17/2014 < 3.5 (L)   Hgb A1c MFr Bld (%)  Date  Value   12/10/2019 7.4 (H)    # Anemia from blood loss and Gi Bleed/melena Received blood transfusion this admission Upper endoscopy (12/25/19)- Normal   Monitor Hgb . Lab Results  Component Value Date   HGB 8.7 (L) 01/02/2020      LOS: [place] 8/21/202111:17 [place], [place]

## 2020-01-03 DIAGNOSIS — D509 Iron deficiency anemia, unspecified: Secondary | ICD-10-CM

## 2020-01-03 LAB — BASIC METABOLIC PANEL
Anion gap: 12 (ref 5–15)
BUN: 14 mg/dL (ref 6–20)
CO2: 27 mmol/L (ref 22–32)
Calcium: 8 mg/dL — ABNORMAL LOW (ref 8.9–10.3)
Chloride: 95 mmol/L — ABNORMAL LOW (ref 98–111)
Creatinine, Ser: 2.99 mg/dL — ABNORMAL HIGH (ref 0.61–1.24)
GFR calc Af Amer: 25 mL/min — ABNORMAL LOW (ref >60)
GFR calc non Af Amer: 22 mL/min — ABNORMAL LOW (ref >60)
Glucose, Bld: 290 mg/dL — ABNORMAL HIGH (ref 70–99)
Potassium: 3.7 mmol/L (ref 3.5–5.1)
Sodium: 134 mmol/L — ABNORMAL LOW (ref 135–145)

## 2020-01-03 LAB — CBC
HCT: 28.3 % — ABNORMAL LOW (ref 39.0–52.0)
Hemoglobin: 9.1 g/dL — ABNORMAL LOW (ref 13.0–17.0)
MCH: 30.4 pg (ref 26.0–34.0)
MCHC: 32.2 g/dL (ref 30.0–36.0)
MCV: 94.6 fL (ref 80.0–100.0)
Platelets: 185 10*3/uL (ref 150–400)
RBC: 2.99 MIL/uL — ABNORMAL LOW (ref 4.22–5.81)
RDW: 15.6 % — ABNORMAL HIGH (ref 11.5–15.5)
WBC: 5.5 10*3/uL (ref 4.0–10.5)
nRBC: 0 % (ref 0.0–0.2)

## 2020-01-03 LAB — GLUCOSE, CAPILLARY
Glucose-Capillary: 342 mg/dL — ABNORMAL HIGH (ref 70–99)
Glucose-Capillary: 246 mg/dL — ABNORMAL HIGH (ref 70–99)
Glucose-Capillary: 219 mg/dL — ABNORMAL HIGH (ref 70–99)
Glucose-Capillary: 135 mg/dL — ABNORMAL HIGH (ref 70–99)
Glucose-Capillary: 134 mg/dL — ABNORMAL HIGH (ref 70–99)

## 2020-01-03 LAB — MAGNESIUM: Magnesium: 1.9 mg/dL (ref 1.7–2.4)

## 2020-01-03 MED ORDER — INSULIN ASPART 100 UNIT/ML ~~LOC~~ SOLN: 0.0000 [IU] | Freq: Three times a day (TID) | SUBCUTANEOUS | Status: DC

## 2020-01-03 MED ORDER — IPRATROPIUM-ALBUTEROL 0.5-2.5 (3) MG/3ML IN SOLN
3.0000 mL | Freq: Four times a day (QID) | RESPIRATORY_TRACT | Status: DC
  Administered 2020-01-03 – 2020-01-04 (×2): 3 mL via RESPIRATORY_TRACT
  Filled 2020-01-03 (×4): qty 3

## 2020-01-03 NOTE — Progress Notes (Signed)
PROGRESS NOTE    [name]  QVZ:[id number] DOB: [birth year] DOA: 12/24/2019 PCP: Reginald Harrier, MD   Chief complaint.  Shortness of breath.  Brief Narrative: [name] a 59 y.o.AA malepast medical history significant for diabetes, stroke, hypertension, history of alcohol use, [name],Thursday and Saturday,[name] Hemoglobin was at 6. Patient reports a history of shortness of breath on exertion for the last 3 to 4 months. He also reported history of episode ofblack stool a week prior to admission.   Assessment & Plan:   Active Problems:   Iron deficiency anemia   Uncontrolled diabetes mellitus (HCC)   CVA (cerebral vascular accident) ([place])   Type 2 diabetes mellitus without complication, with long-term current use of insulin ([place])   ESRD (end stage renal disease) ([place])   Anemia   Acute on chronic respiratory failure with hypoxemia (HCC)   Aspiration pneumonia of both lower lobes due to gastric secretions ([place])  #1.  Acute on chronic hypoxemic respiratory failure. Initially thought was due to volume overload.  Procalcitonin level was not significant elevated.  Condition did not improve after dialysis.  CT angiogram ruled out PE, consistent with aspiration pneumonia.  Patient was on 2 L oxygen before admission.   --O2 requirement slightly improved, from 5L down to 4L --Wean O2 as able, unable to wean down from 4L today --dialysis for fluid removal --Add DuoNeb QID  # Pulm edema and bilateral pleural effusions --repeat CXR 8/19 showed worsening "vascular congestion, extensive interstitial and ground-glass disease most likely pulmonary edema. There are at least small bilateral effusions."  --dialysis for fluid removal  2.  Aspiration pneumonia at the bilateral lower lobes ruled in. Patient appeared to have  some difficulty clear up upper airway.   --completed 5 days of Unasyn --dysphagia 3 with thin liquids, per SLP  3.  Severe iron deficient anemia. Condition has been stable for the last few days.  Received IV iron.  EGD did not show any active bleeding.  Folate and Vit b12 wnl. --Hgb stable between 8-9 --continue oral iron suppl  4.  End-stage renal disease on HD TTS --dialysis per nephrology  5.  Moderate protein calorie malnutrition.   --continue Glucerna  6.  Uncontrolled type 2 diabetes with hyperglycemia with frequent hypoglycemia. --very labile BG with extreme highs and lows.  Even SSI appeared to persist longer than usual. --changed to very sensitive SSI, reduce meal-time to 3u TID and resume Lantus at lower dose of 4u daily, per diabetic educator rec PLAN: --continue current insulin regimen --Add back bed-time SSI  7.  Recent left second toe amputation. Evaluated by podiatry, no infection.   DVT prophylaxis: Heparin SQ Code Status: Full code  Family Communication: pt does not want me to contact his wife Status is: inpatient Dispo:   The patient is from: home Anticipated d/c is to: home with [facility] Anticipated d/c date is: 1-2 days Patient currently is not medically stable to d/c due to: increased O2 requirement, now 4L, persistent pulm edema in a dialysis pt.    Consultants:  Nephrology  Procedures:HD  Antimicrobials:None     Subjective: Pt had no complaints.  Having BM's.  Unable to wean down from 4L O2.    Objective: Vitals:   01/03/20 0826 01/03/20 1000 01/03/20 1152 01/03/20 1650  BP: (!) 147/81  132/82 (!) 148/82  Pulse:  87   Resp:      Temp:   97.9 F (36.6 C)   TempSrc:      SpO2:   96%   Weight:  59.4 kg    Height:        Intake/Output Summary (Last 24 hours) at 01/03/2020 1702 Last data filed at 01/02/2020 1815 Gross per 24 hour  Intake 0 ml  Output --  Net 0 ml   Filed Weights   12/24/19 1208 12/25/19 1328 01/03/20 1000   Weight: 61.2 kg 62.5 kg 59.4 kg    Examination:  Constitutional: NAD, AAOx3 HEENT: conjunctivae and lids normal, EOMI CV: RRR no M,R,G. Distal pulses +2.  No cyanosis.   RESP: CTA B/L over anterior, normal respiratory effort, on 4L GI: +BS, NTND Extremities: No effusions, edema, or tenderness in BLE SKIN: warm, dry and intact Neuro: II - XII grossly intact.  Sensation intact Psych: flat mood and affect.     Data Reviewed: I have personally reviewed following labs and imaging studies  CBC: Recent Labs  Lab 12/28/19 0621 12/28/19 0621 12/29/19 0430 12/29/19 0430 12/30/19 0436 12/31/19 0403 01/01/20 0436 01/02/20 0450 01/03/20 0433  WBC 6.1   < > 5.7   < > 5.4 5.5 4.8 4.6 5.5  NEUTROABS 3.9  --  3.9  --  3.7  --   --   --   --   HGB 8.4*   < > 7.9*   < > 7.8* 8.5* 8.2* 8.7* 9.1*  HCT 25.5*   < > 24.0*   < > 25.1* 26.2* 26.4* 26.7* 28.3*  MCV 93.1   < > 93.4   < > 97.7 94.9 98.1 94.3 94.6  PLT 246   < > 219   < > 219 225 194 202 185   < > = values in this interval not displayed.   Basic Metabolic Panel: Recent Labs  Lab 12/30/19 0436 12/30/19 0436 12/31/19 0403 01/01/20 0436 01/01/20 1624 01/02/20 0450 01/03/20 0433  NA 139  --  141 136  --  140 134*  K 4.1  --  4.8 4.1  --  4.0 3.7  CL 99  --  103 96*  --  98 95*  CO2 28  --  25 28  --  27 27  GLUCOSE 64*   < > 56* 327* 158* 315* 290*  BUN 31*  --  37* 19  --  24* 14  CREATININE 4.42*  --  5.11* 3.56*  --  4.37* 2.99*  CALCIUM 8.3*  --  8.4* 8.1*  --  8.2* 8.0*  MG  --   --  2.4 2.0  --  2.0 1.9   < > = values in this interval not displayed.   GFR: Estimated Creatinine Clearance: 22.1 mL/min (A) (by C-G formula based on SCr of 2.99 mg/dL (H)). Liver Function Tests: No results for input(s): AST, ALT, ALKPHOS, BILITOT, PROT, ALBUMIN in the last 168 hours. No results for input(s): LIPASE, AMYLASE in the last 168 hours. No results for input(s): AMMONIA in the last 168 hours. Coagulation Profile: No results  for input(s): INR, PROTIME in the last 168 hours. Cardiac Enzymes: No results for input(s): CKTOTAL, CKMB, CKMBINDEX, TROPONINI in the last 168 hours. BNP (last 3 results) No results for input(s): PROBNP in the last 8760 hours. HbA1C: No results for input(s): HGBA1C in the last 72 hours. CBG: Recent Labs  Lab 01/02/20 2122 01/03/20 0223 01/03/20 0754 01/03/20 1150 01/03/20 1637  GLUCAP  365* 342* 246* 219* 135*   Lipid Profile: No results for input(s): CHOL, HDL, LDLCALC, TRIG, CHOLHDL, LDLDIRECT in the last 72 hours. Thyroid Function Tests: No results for input(s): TSH, T4TOTAL, FREET4, T3FREE, THYROIDAB in the last 72 hours. Anemia Panel: No results for input(s): VITAMINB12, FOLATE, FERRITIN, TIBC, IRON, RETICCTPCT in the last 72 hours. Sepsis Labs: Recent Labs  Lab 12/31/19 0403 01/01/20 0436 01/02/20 0450  PROCALCITON 1.77 1.44 1.63    Recent Results (from the past 240 hour(s))  Gastrointestinal Panel by PCR , Stool     Status: None   Collection Time: 12/24/19  5:12 PM   Specimen: Stool  Result Value Ref Range Status   Campylobacter species NOT DETECTED NOT DETECTED Final   Plesimonas shigelloides NOT DETECTED NOT DETECTED Final   Salmonella species NOT DETECTED NOT DETECTED Final   Yersinia enterocolitica NOT DETECTED NOT DETECTED Final   Vibrio species NOT DETECTED NOT DETECTED Final   Vibrio cholerae NOT DETECTED NOT DETECTED Final   Enteroaggregative E coli (EAEC) NOT DETECTED NOT DETECTED Final   Enteropathogenic E coli (EPEC) NOT DETECTED NOT DETECTED Final   Enterotoxigenic E coli (ETEC) NOT DETECTED NOT DETECTED Final   Shiga like toxin producing E coli (STEC) NOT DETECTED NOT DETECTED Final   Shigella/Enteroinvasive E coli (EIEC) NOT DETECTED NOT DETECTED Final   Cryptosporidium NOT DETECTED NOT DETECTED Final   Cyclospora cayetanensis NOT DETECTED NOT DETECTED Final   Entamoeba histolytica NOT DETECTED NOT DETECTED Final   Giardia lamblia NOT DETECTED  NOT DETECTED Final   Adenovirus F40/41 NOT DETECTED NOT DETECTED Final   Astrovirus NOT DETECTED NOT DETECTED Final   Norovirus GI/GII NOT DETECTED NOT DETECTED Final   Rotavirus A NOT DETECTED NOT DETECTED Final   Sapovirus (I, II, IV, and V) NOT DETECTED NOT DETECTED Final    Comment: Performed at [facility], [address] [place], [place] [address]  C Difficile Quick Screen w PCR reflex     Status: None   Collection Time: 12/24/19  5:12 PM   Specimen: STOOL  Result Value Ref Range Status   C Diff antigen NEGATIVE NEGATIVE Final   C Diff toxin NEGATIVE NEGATIVE Final   C Diff interpretation No C. difficile detected.  Final    Comment: Performed at [facility], [address] [place], [place] [address]  SARS Coronavirus 2 by RT PCR (hospital order, performed in [facility] hospital lab) Nasopharyngeal Nasopharyngeal Swab     Status: None   Collection Time: 12/24/19  5:12 PM   Specimen: Nasopharyngeal Swab  Result Value Ref Range Status   SARS Coronavirus 2 NEGATIVE NEGATIVE Final    Comment: (NOTE) SARS-CoV-2 target nucleic acids are NOT DETECTED.  The SARS-CoV-2 RNA is generally detectable in upper and lower respiratory specimens during the acute phase of infection. The lowest concentration of SARS-CoV-2 viral copies this assay can detect is 250 copies / mL. A negative result does not preclude SARS-CoV-2 infection and should not be used as the sole basis for treatment or other patient management decisions.  A negative result may occur with improper specimen collection / handling, submission of specimen other than nasopharyngeal swab, presence of viral mutation(s) within the areas targeted by this assay, and inadequate number of viral copies (<250 copies / mL). A negative result must be combined with clinical observations, patient history, and epidemiological information.  Fact Sheet for Patients:   StrictlyIdeas.no  Fact Sheet  for Healthcare Providers: BankingDealers.co.za  This test is not  yet approved or  cleared by the [place] and has been authorized for detection and/or diagnosis of SARS-CoV-2 by FDA under an Emergency Use Authorization (EUA).  This EUA will remain in effect (meaning this test can be used) for the duration of the COVID-19 declaration under Section 564(b)(1) of the Act, 21 U.S.C. section 360bbb-3(b)(1), unless the authorization is terminated or revoked sooner.  Performed at [facility], [address], [place], [place] [address]          Radiology Studies: [facility] Chest [place] 1 View  Result Date: 01/02/2020 CLINICAL DATA:  Shortness of breath. EXAM: PORTABLE CHEST 1 VIEW COMPARISON:  12/31/2019 and prior studies FINDINGS: Cardiomegaly and diffuse bilateral airspace/interstitial opacities again noted. Small bilateral pleural effusions are unchanged. A RIGHT IJ central venous catheter is again noted with tip overlying the SUPERIOR cavoatrial junction. No pneumothorax noted. IMPRESSION: Unchanged appearance of the chest with diffuse bilateral airspace/interstitial opacities and small bilateral pleural effusions. Electronically Signed   By: Margarette Canada M.D.   On: 01/02/2020 11:28        Scheduled Meds:  aspirin EC  81 mg Oral Daily   carvedilol  6.25 mg Oral BID WC   Chlorhexidine Gluconate Cloth  6 each Topical Daily   epoetin (EPOGEN/PROCRIT) injection  4,000 Units Intravenous Q T,Th,Sa-HD   feeding supplement (GLUCERNA SHAKE)  237 mL Oral BID BM   folic acid  1 mg Oral Daily   heparin  5,000 Units Subcutaneous Q8H   hydrALAZINE  25 mg Oral BID   insulin aspart  0-6 Units Subcutaneous TID WC   insulin aspart  3 Units Subcutaneous TID WC   insulin glargine  4 Units Subcutaneous Daily   ipratropium-albuterol  3 mL Nebulization QID   iron polysaccharides  150 mg Oral Daily   melatonin  2.5 mg Oral QHS   multivitamin  1 tablet  Oral Daily   nicotine  7 mg Transdermal Daily   pantoprazole (PROTONIX) IV  40 mg Intravenous Q12H   sertraline  100 mg Oral Daily   thiamine  100 mg Oral Daily   Continuous Infusions:    LOS: 9 days     Enzo Bi, MD [facility]   To contact the attending provider between 7A-7P or the covering provider during after hours 7P-7A, please log into the web site [email or web address] and access using universal [place] password for that web site. If you do not have the password, please call the hospital operator.  01/03/2020, 5:02 PM

## 2020-01-03 NOTE — Progress Notes (Signed)
[facility], [place] 01/03/20  Subjective:   LOS: 9    Patient was originally admitted for dyspnea on exertion and Low Hgb  Denies acute shortness of breath but still requiring 4 L oxygen supplementation.  States that at baseline uses 2 L at home. Oxygen saturation decreases with minimal exertion 2 L removed at dialysis yesterday    Objective:  Vital signs in last 24 hours:  Temp:  [97.7 F (36.5 C)-97.9 F (36.6 C)] 97.9 F (36.6 C) (08/22 1152) Pulse Rate:  [87-89] 87 (08/22 1152) Resp:  [18-20] 20 (08/22 0425) BP: (132-147)/(76-82) 132/82 (08/22 1152) SpO2:  [95 %-98 %] 96 % (08/22 1152)  Weight change:  Filed Weights   12/24/19 1208 12/25/19 1328  Weight: 61.2 kg 62.5 kg    Intake/Output:    Intake/Output Summary (Last 24 hours) at 01/03/2020 1440 Last data filed at 01/02/2020 1815 Gross per 24 hour  Intake 0 ml  Output --  Net 0 ml   Physical Exam: General:  laying in bed, NAD  HEENT  Oral mucosa moist  Pulm/lungs   requiring 4-5 L O2 [place], decreased breath sounds at bases  CVS/Heart   S1S2,no rub or gallop  Abdomen:   Soft, nontender  Extremities:  No peripheral edema  Neurologic:  Alert, oriented  Skin:  No rashes or lesions noted  Rt IJ PC  Basic Metabolic Panel:  Recent Labs  Lab 12/30/19 0436 12/30/19 0436 12/31/19 0403 12/31/19 0403 01/01/20 0436 01/01/20 1624 01/02/20 0450 01/03/20 0433  NA 139  --  141  --  136  --  140 134*  K 4.1  --  4.8  --  4.1  --  4.0 3.7  CL 99  --  103  --  96*  --  98 95*  CO2 28  --  25  --  28  --  27 27  GLUCOSE 64*   < > 56*  --  327* 158* 315* 290*  BUN 31*  --  37*  --  19  --  24* 14  CREATININE 4.42*  --  5.11*  --  3.56*  --  4.37* 2.99*  CALCIUM 8.3*   < > 8.4*   < > 8.1*  --  8.2* 8.0*  MG  --   --  2.4  --  2.0  --  2.0 1.9   < > = values in this interval not displayed.     CBC: Recent Labs  Lab 12/28/19 0621 12/28/19 0621 12/29/19 0430 12/29/19 0430  12/30/19 0436 12/31/19 0403 01/01/20 0436 01/02/20 0450 01/03/20 0433  WBC 6.1   < > 5.7   < > 5.4 5.5 4.8 4.6 5.5  NEUTROABS 3.9  --  3.9  --  3.7  --   --   --   --   HGB 8.4*   < > 7.9*   < > 7.8* 8.5* 8.2* 8.7* 9.1*  HCT 25.5*   < > 24.0*   < > 25.1* 26.2* 26.4* 26.7* 28.3*  MCV 93.1   < > 93.4   < > 97.7 94.9 98.1 94.3 94.6  PLT 246   < > 219   < > 219 225 194 202 185   < > = values in this interval not displayed.      Lab Results  Component Value Date   HEPBSAG NON REACTIVE 12/06/2019   HEPBSAB NON REACTIVE 04/10/2019      Microbiology:  Recent Results (from  the past 240 hour(s))  Gastrointestinal Panel by PCR , Stool     Status: None   Collection Time: 12/24/19  5:12 PM   Specimen: Stool  Result Value Ref Range Status   Campylobacter species NOT DETECTED NOT DETECTED Final   Plesimonas shigelloides NOT DETECTED NOT DETECTED Final   Salmonella species NOT DETECTED NOT DETECTED Final   Yersinia enterocolitica NOT DETECTED NOT DETECTED Final   Vibrio species NOT DETECTED NOT DETECTED Final   Vibrio cholerae NOT DETECTED NOT DETECTED Final   Enteroaggregative E coli (EAEC) NOT DETECTED NOT DETECTED Final   Enteropathogenic E coli (EPEC) NOT DETECTED NOT DETECTED Final   Enterotoxigenic E coli (ETEC) NOT DETECTED NOT DETECTED Final   Shiga like toxin producing E coli (STEC) NOT DETECTED NOT DETECTED Final   Shigella/Enteroinvasive E coli (EIEC) NOT DETECTED NOT DETECTED Final   Cryptosporidium NOT DETECTED NOT DETECTED Final   Cyclospora cayetanensis NOT DETECTED NOT DETECTED Final   Entamoeba histolytica NOT DETECTED NOT DETECTED Final   Giardia lamblia NOT DETECTED NOT DETECTED Final   Adenovirus F40/41 NOT DETECTED NOT DETECTED Final   Astrovirus NOT DETECTED NOT DETECTED Final   Norovirus GI/GII NOT DETECTED NOT DETECTED Final   Rotavirus A NOT DETECTED NOT DETECTED Final   Sapovirus (I, II, IV, and V) NOT DETECTED NOT DETECTED Final    Comment: Performed at  [facility], [address] [place], [place] [address]  C Difficile Quick Screen w PCR reflex     Status: None   Collection Time: 12/24/19  5:12 PM   Specimen: STOOL  Result Value Ref Range Status   C Diff antigen NEGATIVE NEGATIVE Final   C Diff toxin NEGATIVE NEGATIVE Final   C Diff interpretation No C. difficile detected.  Final    Comment: Performed at [facility], [address] [place], [place] [address]  SARS Coronavirus 2 by RT PCR (hospital order, performed in [facility] hospital lab) Nasopharyngeal Nasopharyngeal Swab     Status: None   Collection Time: 12/24/19  5:12 PM   Specimen: Nasopharyngeal Swab  Result Value Ref Range Status   SARS Coronavirus 2 NEGATIVE NEGATIVE Final    Comment: (NOTE) SARS-CoV-2 target nucleic acids are NOT DETECTED.  The SARS-CoV-2 RNA is generally detectable in upper and lower respiratory specimens during the acute phase of infection. The lowest concentration of SARS-CoV-2 viral copies this assay can detect is 250 copies / mL. A negative result does not preclude SARS-CoV-2 infection and should not be used as the sole basis for treatment or other patient management decisions.  A negative result may occur with improper specimen collection / handling, submission of specimen other than nasopharyngeal swab, presence of viral mutation(s) within the areas targeted by this assay, and inadequate number of viral copies (<250 copies / mL). A negative result must be combined with clinical observations, patient history, and epidemiological information.  Fact Sheet for Patients:   StrictlyIdeas.no  Fact Sheet for Healthcare Providers: BankingDealers.co.za  This test is not yet approved or  cleared by the [place] FDA and has been authorized for detection and/or diagnosis of SARS-CoV-2 by FDA under an Emergency Use Authorization (EUA).  This EUA will remain in effect (meaning  this test can be used) for the duration of the COVID-19 declaration under Section 564(b)(1) of the Act, 21 U.S.C. section 360bbb-3(b)(1), unless the authorization is terminated or revoked sooner.  Performed at [facility], [address] [place], [place] [address]  Coagulation Studies: No results for input(s): LABPROT, INR in the last 72 hours.  Urinalysis: No results for input(s): COLORURINE, LABSPEC, PHURINE, GLUCOSEU, HGBUR, BILIRUBINUR, KETONESUR, PROTEINUR, UROBILINOGEN, NITRITE, LEUKOCYTESUR in the last 72 hours.  Invalid input(s): APPERANCEUR    Imaging: DG Chest Port 1 View  Result Date: 01/02/2020 CLINICAL DATA:  Shortness of breath. EXAM: PORTABLE CHEST 1 VIEW COMPARISON:  12/31/2019 and prior studies FINDINGS: Cardiomegaly and diffuse bilateral airspace/interstitial opacities again noted. Small bilateral pleural effusions are unchanged. A RIGHT IJ central venous catheter is again noted with tip overlying the SUPERIOR cavoatrial junction. No pneumothorax noted. IMPRESSION: Unchanged appearance of the chest with diffuse bilateral airspace/interstitial opacities and small bilateral pleural effusions. Electronically Signed   By: Margarette Canada M.D.   On: 01/02/2020 11:28     Medications:    . aspirin EC  81 mg Oral Daily  . carvedilol  6.25 mg Oral BID WC  . Chlorhexidine Gluconate Cloth  6 each Topical Daily  . epoetin (EPOGEN/PROCRIT) injection  4,000 Units Intravenous Q T,Th,Sa-HD  . feeding supplement (GLUCERNA SHAKE)  237 mL Oral BID BM  . folic acid  1 mg Oral Daily  . heparin  5,000 Units Subcutaneous Q8H  . hydrALAZINE  25 mg Oral BID  . insulin aspart  0-6 Units Subcutaneous TID WC  . insulin aspart  3 Units Subcutaneous TID WC  . insulin glargine  4 Units Subcutaneous Daily  . ipratropium-albuterol  3 mL Nebulization QID  . iron polysaccharides  150 mg Oral Daily  . melatonin  2.5 mg Oral QHS  . multivitamin  1 tablet Oral Daily  . nicotine  7  mg Transdermal Daily  . pantoprazole (PROTONIX) IV  40 mg Intravenous Q12H  . sertraline  100 mg Oral Daily  . thiamine  100 mg Oral Daily   acetaminophen **OR** acetaminophen, diphenhydrAMINE, ondansetron **OR** ondansetron (ZOFRAN) IV  Assessment/ Plan:  59 y.o. male with  was admitted on 12/24/2019 for  Active Problems:   Iron deficiency anemia   Uncontrolled diabetes mellitus ([place])   CVA (cerebral vascular accident) ([place])   Type 2 diabetes mellitus without complication, with long-term current use of insulin ([place])   ESRD (end stage renal disease) ([place])   Anemia   Acute on chronic respiratory failure with hypoxemia (HCC)   Aspiration pneumonia of both lower lobes due to gastric secretions (HCC)  Cough [R05] Weakness [R53.1] Anemia [D64.9] Symptomatic anemia [D64.9] Diarrhea, unspecified type [R19.7]  [facility] /TuThSa-1// TW//61.5kg  #. ESRD with shortness of breath -Continue routine hemodialysis on TTS schedule -Outpatient dry weight of 61.5 kg -In-hospital standing weight requested -Chest x-ray continues to show diffuse bilateral airspace disease/interstitial opacities. Next hemodialysis is planned for Tuesday   CT angiogram of chest 8/16 - moderate cardiomegaly and pleural effusions   #. Anemia of CKD  Lab Results  Component Value Date   HGB 9.1 (L) 01/03/2020   Low dose EPO with HD  #. Secondary hyperparathyroidism of renal origin N 25.81   No results found for: PTH Lab Results  Component Value Date   PHOS 6.6 (H) 12/26/2019   Monitor calcium and phos level during this admission   #. Diabetes type 2 with CKD Hemoglobin A1C (%)  Date Value  01/17/2014 < 3.5 (L)   Hgb A1c MFr Bld (%)  Date Value  12/10/2019 7.4 (H)    # Anemia from blood loss and Gi Bleed/melena Received blood transfusion this admission Upper endoscopy (12/25/19)- Normal   Monitor Hgb .  Lab Results  Component Value Date   HGB 9.1 (L) 01/03/2020      LOS:  [place] 8/22/20212:40 PM  [facility] [place], [place]

## 2020-01-04 LAB — GLUCOSE, CAPILLARY
Glucose-Capillary: 72 mg/dL (ref 70–99)
Glucose-Capillary: 270 mg/dL — ABNORMAL HIGH (ref 70–99)
Glucose-Capillary: 152 mg/dL — ABNORMAL HIGH (ref 70–99)

## 2020-01-04 LAB — CBC
HCT: 27.5 % — ABNORMAL LOW (ref 39.0–52.0)
Hemoglobin: 8.5 g/dL — ABNORMAL LOW (ref 13.0–17.0)
MCH: 30.4 pg (ref 26.0–34.0)
MCHC: 30.9 g/dL (ref 30.0–36.0)
MCV: 98.2 fL (ref 80.0–100.0)
Platelets: 186 10*3/uL (ref 150–400)
RBC: 2.8 MIL/uL — ABNORMAL LOW (ref 4.22–5.81)
RDW: 15.6 % — ABNORMAL HIGH (ref 11.5–15.5)
WBC: 4.7 10*3/uL (ref 4.0–10.5)
nRBC: 0 % (ref 0.0–0.2)

## 2020-01-04 LAB — BASIC METABOLIC PANEL
Anion gap: 12 (ref 5–15)
BUN: 26 mg/dL — ABNORMAL HIGH (ref 6–20)
CO2: 26 mmol/L (ref 22–32)
Calcium: 8.1 mg/dL — ABNORMAL LOW (ref 8.9–10.3)
Chloride: 101 mmol/L (ref 98–111)
Creatinine, Ser: 4.17 mg/dL — ABNORMAL HIGH (ref 0.61–1.24)
GFR calc Af Amer: 17 mL/min — ABNORMAL LOW (ref >60)
GFR calc non Af Amer: 14 mL/min — ABNORMAL LOW (ref >60)
Glucose, Bld: 119 mg/dL — ABNORMAL HIGH (ref 70–99)
Potassium: 4.2 mmol/L (ref 3.5–5.1)
Sodium: 139 mmol/L (ref 135–145)

## 2020-01-04 LAB — MAGNESIUM: Magnesium: 2.1 mg/dL (ref 1.7–2.4)

## 2020-01-04 MED ORDER — POLYSACCHARIDE IRON COMPLEX 150 MG PO CAPS: 150.0000 mg | ORAL_CAPSULE | Freq: Every day | ORAL | 2 refills | Status: AC

## 2020-01-04 MED ORDER — INSULIN GLARGINE 100 UNIT/ML ~~LOC~~ SOLN: 4.0000 [IU] | Freq: Every day | SUBCUTANEOUS | Status: DC

## 2020-01-04 MED ORDER — INSULIN LISPRO (1 UNIT DIAL) 100 UNIT/ML (KWIKPEN): 3.0000 [IU] | PEN_INJECTOR | Freq: Three times a day (TID) | SUBCUTANEOUS | Status: DC

## 2020-01-04 MED ORDER — EPOETIN ALFA 10000 UNIT/ML IJ SOLN
4000.0000 [IU] | INTRAMUSCULAR | Status: AC
Start: 2020-01-05 — End: ?

## 2020-01-04 MED ORDER — NICOTINE 7 MG/24HR TD PT24: 7.0000 mg | MEDICATED_PATCH | Freq: Every day | TRANSDERMAL | 2 refills | Status: AC

## 2020-01-04 NOTE — Progress Notes (Signed)
[name] A and O x4. VSS. Pt tolerating diet well. No complaints of nausea or vomiting. IV removed intact, prescriptions given. Pt voices understanding of discharge instructions with no further questions. Patient discharged via wheelchair with NT  Allergies as of 01/04/2020      Reactions   Ferrous Gluconate Nausea And Vomiting   Other       Medication List    STOP taking these medications   HYDROcodone-acetaminophen 5-325 MG tablet Commonly known as: NORCO/VICODIN   Prosol 20 % Soln     TAKE these medications   aspirin 81 MG EC tablet Take 1 tablet (81 mg total) by mouth daily.   carvedilol 6.25 MG tablet Commonly known as: COREG Take 6.25 mg by mouth 2 (two) times daily with a meal.   Entresto 24-26 MG Generic drug: sacubitril-valsartan Take 1 tablet by mouth 2 (two) times daily.   epoetin alfa 10000 UNIT/ML injection Commonly known as: EPOGEN Inject 0.4 mLs (4,000 Units total) into the vein Every Tuesday,Thursday,and Saturday with dialysis. Start taking on: January 05, 2020   feeding supplement (NEPRO CARB STEADY) Liqd Take 237 mLs by mouth 3 (three) times daily between meals.   folic acid 1 MG tablet Commonly known as: FOLVITE Take 1 mg by mouth daily.   hydrALAZINE 25 MG tablet Commonly known as: APRESOLINE Take 1 tablet (25 mg total) by mouth every 8 (eight) hours. What changed: when to take this   insulin glargine 100 UNIT/ML injection Commonly known as: LANTUS Inject 0.04 mLs (4 Units total) into the skin daily. What changed: how much to take   insulin lispro 100 UNIT/ML KwikPen Commonly known as: HumaLOG KwikPen Inject 0.03 mLs (3 Units total) into the skin 3 (three) times daily with meals. What changed:   how much to take  additional instructions   iron polysaccharides 150 MG capsule Commonly known as: NIFEREX Take 1 capsule (150 mg total) by mouth daily. Start taking on: January 05, 2020   melatonin 3 MG Tabs tablet Take 1 tablet by mouth  at bedtime.   multivitamin Tabs tablet Take 1 tablet by mouth daily.   nicotine 7 mg/24hr patch Commonly known as: NICODERM CQ - dosed in mg/24 hr Place 1 patch (7 mg total) onto the skin daily. Start taking on: January 05, 2020   sertraline 100 MG tablet Commonly known as: ZOLOFT Take 100 mg by mouth daily.   thiamine 100 MG tablet Commonly known as: VITAMIN B-1 Take 100 mg by mouth daily.            Durable Medical Equipment  (From admission, onward)         Start     Ordered   01/04/20 0957  For home use only DME oxygen  Once       Comments: liter flow change, up from 2L prior  Question Answer Comment  Length of Need 6 Months   Mode or (Route) Nasal cannula   Liters per Minute 4   Frequency Continuous (stationary and portable oxygen unit needed)   Oxygen delivery system Gas      01/04/20 0956          Vitals:   01/04/20 1530 01/04/20 1540  BP: 139/80 (!) 146/77  Pulse: 84 80  Resp: 19 18  Temp:  98.2 F (36.8 C)  SpO2:      [name]

## 2020-01-04 NOTE — Progress Notes (Signed)
[facility], [place] 01/04/20  Subjective:   LOS: 10    Patient was  admitted on 12/24/19 SOB and Low Hgb  Denies acute shortness of breath but still requiring 4 L oxygen supplementation.  His baseline home O2  requirement is 2 liters Oxygen saturation decreases with minimal exertion     Objective:  Vital signs in last 24 hours:  Temp:  [98.1 F (36.7 C)-98.3 F (36.8 C)] 98.2 F (36.8 C) (08/23 1540) Pulse Rate:  [78-84] 80 (08/23 1540) Resp:  [16-20] 18 (08/23 1540) BP: (126-148)/(70-86) 146/77 (08/23 1540) SpO2:  [95 %-100 %] 98 % (08/23 1210)  Weight change:  Filed Weights   12/24/19 1208 12/25/19 1328 01/03/20 1000  Weight: 61.2 kg 62.5 kg 59.4 kg    Intake/Output:    Intake/Output Summary (Last 24 hours) at 01/04/2020 1557 Last data filed at 01/04/2020 1540 Gross per 24 hour  Intake --  Output 1500 ml  Net -1500 ml   Physical Exam: General:  No acute distress  HEENT  Oral mucosa moist  Pulm/lungs  requiring 4L O2 [place], Lungs diminished at the bases  CVS/Heart  Regular,no rub or gallop  Abdomen:   Soft, nontender  Extremities:  No peripheral edema  Neurologic:  Alert, oriented  Skin:  No rashes or lesions noted  Rt IJ PC  Basic Metabolic Panel:  Recent Labs  Lab 12/31/19 0403 12/31/19 0403 01/01/20 0436 01/01/20 0436 01/01/20 1624 01/02/20 0450 01/03/20 0433 01/04/20 0923  NA 141  --  136  --   --  140 134* 139  K 4.8  --  4.1  --   --  4.0 3.7 4.2  CL 103  --  96*  --   --  98 95* 101  CO2 25  --  28  --   --  27 27 26   GLUCOSE 56*   < > 327*  --  158* 315* 290* 119*  BUN 37*  --  19  --   --  24* 14 26*  CREATININE 5.11*  --  3.56*  --   --  4.37* 2.99* 4.17*  CALCIUM 8.4*   < > 8.1*   < >  --  8.2* 8.0* 8.1*  MG 2.4  --  2.0  --   --  2.0 1.9 2.1   < > = values in this interval not displayed.     CBC: Recent Labs  Lab 12/29/19 0430 12/29/19 0430 12/30/19 0436 12/30/19 0436 12/31/19 0403 01/01/20 0436  01/02/20 0450 01/03/20 0433 01/04/20 0923  WBC 5.7   < > 5.4   < > 5.5 4.8 4.6 5.5 4.7  NEUTROABS 3.9  --  3.7  --   --   --   --   --   --   HGB 7.9*   < > 7.8*   < > 8.5* 8.2* 8.7* 9.1* 8.5*  HCT 24.0*   < > 25.1*   < > 26.2* 26.4* 26.7* 28.3* 27.5*  MCV 93.4   < > 97.7   < > 94.9 98.1 94.3 94.6 98.2  PLT 219   < > 219   < > 225 194 202 185 186   < > = values in this interval not displayed.      Lab Results  Component Value Date   HEPBSAG NON REACTIVE 12/06/2019   HEPBSAB NON REACTIVE 04/10/2019      Microbiology:  No results found for this or any previous visit (  from the past 240 hour(s)).  Coagulation Studies: No results for input(s): LABPROT, INR in the last 72 hours.  Urinalysis: No results for input(s): COLORURINE, LABSPEC, PHURINE, GLUCOSEU, HGBUR, BILIRUBINUR, KETONESUR, PROTEINUR, UROBILINOGEN, NITRITE, LEUKOCYTESUR in the last 72 hours.  Invalid input(s): APPERANCEUR    Imaging: No results found.   Medications:    . aspirin EC  81 mg Oral Daily  . carvedilol  6.25 mg Oral BID WC  . Chlorhexidine Gluconate Cloth  6 each Topical Daily  . epoetin (EPOGEN/PROCRIT) injection  4,000 Units Intravenous Q T,Th,Sa-HD  . feeding supplement (GLUCERNA SHAKE)  237 mL Oral BID BM  . folic acid  1 mg Oral Daily  . heparin  5,000 Units Subcutaneous Q8H  . hydrALAZINE  25 mg Oral BID  . insulin aspart  0-6 Units Subcutaneous TID WC & HS  . insulin aspart  3 Units Subcutaneous TID WC  . insulin glargine  4 Units Subcutaneous Daily  . ipratropium-albuterol  3 mL Nebulization QID  . iron polysaccharides  150 mg Oral Daily  . melatonin  2.5 mg Oral QHS  . multivitamin  1 tablet Oral Daily  . nicotine  7 mg Transdermal Daily  . pantoprazole (PROTONIX) IV  40 mg Intravenous Q12H  . sertraline  100 mg Oral Daily  . thiamine  100 mg Oral Daily   acetaminophen **OR** acetaminophen, diphenhydrAMINE, ondansetron **OR** ondansetron (ZOFRAN) IV  Assessment/ Plan:  59 y.o.  male with  was admitted on 12/24/2019 for  Active Problems:   Iron deficiency anemia   Uncontrolled diabetes mellitus ([place])   CVA (cerebral vascular accident) ([place])   Type 2 diabetes mellitus without complication, with long-term current use of insulin ([place])   ESRD (end stage renal disease) ([place])   Anemia   Acute on chronic respiratory failure with hypoxemia (HCC)   Aspiration pneumonia of both lower lobes due to gastric secretions (HCC)  Cough [R05] Weakness [R53.1] Anemia [D64.9] Symptomatic anemia [D64.9] Diarrhea, unspecified type [R19.7]  [facility] /TuThSa-1// TW//61.5kg  #. ESRD with shortness of breath -Continue routine hemodialysis on TTS schedule -Outpatient dry weight of 61.5 kg -In-hospital standing weight requested -Chest x-ray continues to show diffuse bilateral airspace disease/interstitial opacities. Next hemodialysis is planned for Tuesday   CT angiogram of chest 8/16 - moderate cardiomegaly and pleural effusions   #. Anemia of CKD  Lab Results  Component Value Date   HGB 8.5 (L) 01/04/2020   Low dose EPO with HD  #. Secondary hyperparathyroidism of renal origin N 25.81   No results found for: PTH Lab Results  Component Value Date   PHOS 6.6 (H) 12/26/2019   Phosphorus a bit high at 6.6.  We will continue to monitor bone mineral metabolism parameters.   #. Diabetes type 2 with CKD Hemoglobin A1C (%)  Date Value  01/17/2014 < 3.5 (L)   Hgb A1c MFr Bld (%)  Date Value  12/10/2019 7.4 (H)    # Anemia from blood loss and Gi Bleed/melena Received IV Iron.EGD showed no active bleeding,now on oral iron supplements. Monitor Hgb .Will continue Epogen with hemodialysis Lab Results  Component Value Date   HGB 8.5 (L) 01/04/2020      LOS: 10 Malyn Aytes 8/23/20213:57 PM  [facility] [place], [place]

## 2020-01-04 NOTE — Progress Notes (Signed)
[facility], [place] 01/04/20  Subjective:   LOS: 10    Patient was  admitted on 12/24/19 SOB and Low Hgb  Denies acute shortness of breath but still requiring 4 L oxygen supplementation.  His baseline home O2  requirement is 2 liters Oxygen saturation decreases with minimal exertion     Objective:  Vital signs in last 24 hours:  Temp:  [97.9 F (36.6 C)-98.3 F (36.8 C)] 98.1 F (36.7 C) (08/23 0508) Pulse Rate:  [78-87] 80 (08/23 0508) Resp:  [16] 16 (08/23 0508) BP: (126-148)/(70-86) 141/86 (08/23 0809) SpO2:  [95 %-100 %] 99 % (08/23 0754)  Weight change:  Filed Weights   12/24/19 1208 12/25/19 1328 01/03/20 1000  Weight: 61.2 kg 62.5 kg 59.4 kg    Intake/Output:   No intake or output data in the 24 hours ending 01/04/20 1114 Physical Exam: General:  No acute distress  HEENT  Oral mucosa moist  Pulm/lungs   requiring 4L O2 [place], Lungs diminished at the bases  CVS/Heart   Regular,no rub or gallop  Abdomen:   Soft, nontender  Extremities:  No peripheral edema  Neurologic:  Alert, oriented  Skin:  No rashes or lesions noted  Rt IJ PC  Basic Metabolic Panel:  Recent Labs  Lab 12/31/19 0403 12/31/19 0403 01/01/20 0436 01/01/20 0436 01/01/20 1624 01/02/20 0450 01/03/20 0433 01/04/20 0923  NA 141  --  136  --   --  140 134* 139  K 4.8  --  4.1  --   --  4.0 3.7 4.2  CL 103  --  96*  --   --  98 95* 101  CO2 25  --  28  --   --  27 27 26   GLUCOSE 56*   < > 327*  --  158* 315* 290* 119*  BUN 37*  --  19  --   --  24* 14 26*  CREATININE 5.11*  --  3.56*  --   --  4.37* 2.99* 4.17*  CALCIUM 8.4*   < > 8.1*   < >  --  8.2* 8.0* 8.1*  MG 2.4  --  2.0  --   --  2.0 1.9 2.1   < > = values in this interval not displayed.     CBC: Recent Labs  Lab 12/29/19 0430 12/29/19 0430 12/30/19 0436 12/30/19 0436 12/31/19 0403 01/01/20 0436 01/02/20 0450 01/03/20 0433 01/04/20 0923  WBC 5.7   < > 5.4   < > 5.5 4.8 4.6 5.5 4.7   NEUTROABS 3.9  --  3.7  --   --   --   --   --   --   HGB 7.9*   < > 7.8*   < > 8.5* 8.2* 8.7* 9.1* 8.5*  HCT 24.0*   < > 25.1*   < > 26.2* 26.4* 26.7* 28.3* 27.5*  MCV 93.4   < > 97.7   < > 94.9 98.1 94.3 94.6 98.2  PLT 219   < > 219   < > 225 194 202 185 186   < > = values in this interval not displayed.      Lab Results  Component Value Date   HEPBSAG NON REACTIVE 12/06/2019   HEPBSAB NON REACTIVE 04/10/2019      Microbiology:  No results found for this or any previous visit (from the past 240 hour(s)).  Coagulation Studies: No results for input(s): LABPROT, INR in the last 72  hours.  Urinalysis: No results for input(s): COLORURINE, LABSPEC, PHURINE, GLUCOSEU, HGBUR, BILIRUBINUR, KETONESUR, PROTEINUR, UROBILINOGEN, NITRITE, LEUKOCYTESUR in the last 72 hours.  Invalid input(s): APPERANCEUR    Imaging: No results found.   Medications:    . aspirin EC  81 mg Oral Daily  . carvedilol  6.25 mg Oral BID WC  . Chlorhexidine Gluconate Cloth  6 each Topical Daily  . epoetin (EPOGEN/PROCRIT) injection  4,000 Units Intravenous Q T,Th,Sa-HD  . feeding supplement (GLUCERNA SHAKE)  237 mL Oral BID BM  . folic acid  1 mg Oral Daily  . heparin  5,000 Units Subcutaneous Q8H  . hydrALAZINE  25 mg Oral BID  . insulin aspart  0-6 Units Subcutaneous TID WC & HS  . insulin aspart  3 Units Subcutaneous TID WC  . insulin glargine  4 Units Subcutaneous Daily  . ipratropium-albuterol  3 mL Nebulization QID  . iron polysaccharides  150 mg Oral Daily  . melatonin  2.5 mg Oral QHS  . multivitamin  1 tablet Oral Daily  . nicotine  7 mg Transdermal Daily  . pantoprazole (PROTONIX) IV  40 mg Intravenous Q12H  . sertraline  100 mg Oral Daily  . thiamine  100 mg Oral Daily   acetaminophen **OR** acetaminophen, diphenhydrAMINE, ondansetron **OR** ondansetron (ZOFRAN) IV  Assessment/ Plan:  59 y.o. male with  was admitted on 12/24/2019 for  Active Problems:   Iron deficiency anemia    Uncontrolled diabetes mellitus ([place])   CVA (cerebral vascular accident) ([place])   Type 2 diabetes mellitus without complication, with long-term current use of insulin ([place])   ESRD (end stage renal disease) ([place])   Anemia   Acute on chronic respiratory failure with hypoxemia (HCC)   Aspiration pneumonia of both lower lobes due to gastric secretions (HCC)  Cough [R05] Weakness [R53.1] Anemia [D64.9] Symptomatic anemia [D64.9] Diarrhea, unspecified type [R19.7]  [facility] /TuThSa-1// TW//61.5kg  #. ESRD with shortness of breath -Continue routine hemodialysis on TTS schedule -Outpatient dry weight of 61.5 kg -In-hospital standing weight requested -Chest x-ray continues to show diffuse bilateral airspace disease/interstitial opacities. Extra dialysis requested for today, will arrange.     #. Anemia of CKD  Lab Results  Component Value Date   HGB 8.5 (L) 01/04/2020   Continue epogen with dialysis sessions.   #. Secondary hyperparathyroidism of renal origin N 25.81   No results found for: PTH Lab Results  Component Value Date   PHOS 6.6 (H) 12/26/2019   Recheck serum phos as outpt.    #. Diabetes type 2 with CKD Hemoglobin A1C (%)  Date Value  01/17/2014 < 3.5 (L)   Hgb A1c MFr Bld (%)  Date Value  12/10/2019 7.4 (H)  Glycemic control per hospitalist.   # Anemia from blood loss and Gi Bleed/melena Received IV Iron.EGD showed no active bleeding,n ow on oral iron supplements. Monitor Hgb .Will continue Epogen with hemodialysis Lab Results  Component Value Date   HGB 8.5 (L) 01/04/2020      LOS: 10 Ferrell Flam 8/23/202111:14 AM  [facility] [place], [place]

## 2020-01-04 NOTE — Progress Notes (Signed)
Hd started  

## 2020-01-04 NOTE — TOC Progression Note (Addendum)
Transition of Care [facility]) - Progression Note    Patient Details  Name: BRODY BONNEAU MRN: [id number] Date of Birth: 18-May-1960  Transition of Care [facility]) CM/SW [place], LCSW Phone Number: 01/04/2020, 9:52 AM  Clinical Narrative: Patient gets his oxygen through [place]. CSW called and notified them of need for increase to 4 L chronic. They asked that [place] fax over DME order and that MD enter "liter flow change" in the comments section. CSW sent secure chat to MD to notify. Also requested home health orders for PT, OT, and RN.  10:08 am: Faxed oxygen order to [place]. They said patient will be fine to go home on current tank. Either patient can change the liter flow at home or [facility] can come to his home to do it. They said that patient has the larger tanks that can manage 4 L.  10:31 am: Went by room and updated patient. Patient agreeable to [facility] for home health services. He would prefer for [facility] to come to his home to change the liter flow. CSW offered to call his wife or sister to update but patient declined. He said his wife just left so he can update her later.  11:01 am: [facility] confirmed they received the order that was faxed over. CSW notified representative that patient would like for someone to come to the home to change the liter flow. Representative will notify staff and they will reach out to patient or wife to schedule.  Expected Discharge Plan: [place] Barriers to Discharge: Continued Medical Work up  Expected Discharge Plan and Services Expected Discharge Plan: [place] Choice: [place] arrangements for the past 2 months: Single Family Home                                       Social Determinants of Health (SDOH) Interventions    Readmission Risk Interventions Readmission Risk Prevention Plan 12/29/2019 12/09/2019 07/07/2019  Transportation Screening Complete Complete Complete   PCP or Specialist Appt within 3-5 Days - - Complete  HRI or Home Care Consult - - Complete  Palliative Care Screening - - -  Medication Review (RN Care Manager) Complete Complete Complete  PCP or Specialist appointment within 3-5 days of discharge Complete Complete -  [place] or Home Care Consult Complete Complete -  SW Recovery Care/Counseling Consult Complete Complete -  Palliative Care Screening Not Applicable Not Applicable -  [place] Not Applicable Not Applicable -  Some recent data might be hidden

## 2020-01-04 NOTE — Care Management Important Message (Signed)
Important Message  Patient Details  Name: [name] MRN: [id number] Date of Birth: 27-Dec-1960   Medicare Important Message Given:  Yes     Dannette Barbara 01/04/2020, 12:45 PM

## 2020-01-04 NOTE — Progress Notes (Signed)
PT Cancellation Note  Patient Details Name: [name] MRN: [id number] DOB: 02-Jan-1961   Cancelled Treatment:    Reason Eval/Treat Not Completed: Patient at procedure or test/unavailable (2 attempts to see pt this date; inititally getting a bath, then being taken for HD. Will attempt treatment again at later date/time.)  1:30 PM, 01/04/20 Etta Grandchild, PT, DPT Physical Therapist - [facility]  [phone] ([place])    [name] 01/04/2020, 1:30 PM

## 2020-01-04 NOTE — Discharge Summary (Signed)
Physician Discharge Summary   KAIREN Baker  male DOB: 29-Sep-1960  RCV:[id number]  PCP: [name], MD  Admit date: 12/24/2019 Discharge date:  01/04/2020  Admitted From: home Disposition:  home Home Health: Yes CODE STATUS: Full code  Discharge Instructions    Discharge instructions   Complete by: As directed    You have been on 4 liters of supplemental oxygen for 4 days before discharge.  I have prescribed 4 liters of oxygen for you to use at home.  Your blood sugars varied widely, from very high to very low.  To prevent you from dropping too low, we have modified your insulin regimen.  Please take long-acting Lantus 4 units daily and short-acting Humalog 3 units with each meals.   Dr. Enzo Bi - -   No wound care   Complete by: As directed        Hospital Course:  For full details, please see H&P, progress notes, consult notes and ancillary notes.  Briefly,  [name] a 59 y.o.AA malepast medical history significant for diabetes, stroke, hypertension, history of alcohol use, end-stage renal disease on hemodialysis TTS,[name]  Hemoglobin was at 6. Patient reported a history of shortness of breath on exertion for the last 3 to 4 months. He also reported history of episode ofblack stool a week prior to admission.  Acute on chronic hypoxemic respiratory failure Thought to be due to volume overload.  Procalcitonin level was not significant elevated.  Condition did not improve after dialysis.  CT angiogram ruled out PE, consistent with aspiration pneumonia.  Patient was on 2 L oxygen before admission, but required up to 5L during this hospitalization.  Extra dialysis was requested for more volume removal, and pt finished a course of IV abx, however, O2 requirement was difficult to wean down.  Pt remained at 4L O2 for 4 days prior to  discharge, but felt no significant dyspnea.  Pt was eventually discharged on 4L O2.  Pulm edema and bilateral pleural effusions Repeat CXR 8/19 showed worsening "vascular congestion, extensive interstitial and ground-glass disease most likely pulmonary edema. There are at least small bilateral effusions."  Extra dialysis was requested for volume removal, however, per nephrology, pt was already below his outpatient dry weight.  Aspiration pneumonia at the bilateral lower lobes Patient appeared to have some difficulty clear up upper airway.  Pt completed 5 days of Unasyn.  SLP recommended dysphagia 3 with thin liquids.  Severe iron deficient anemia. Received IV iron.  EGD did not show any active bleeding.  Folate and Vit b12 wnl.  Hgb stable between 8-9.  Pt continued on oral iron suppl.  End-stage renal disease on HD TTS Extra dialysis for volume removal requested.  Pt's dry weight may need to be adjusted.  Moderate protein calorie malnutrition.   continued Glucerna  Uncontrolled type 2 diabetes with hyperglycemia with frequent hypoglycemia Extremely labile BG with extreme highs and lows.  Even SSI appeared to persist longer than usual.  Pt required almost daily adjustment of insulin regimen.  Pt was discharged on long-acting Lantus 4 units daily and short-acting Humalog 3 units with each meals.  Recent left second toe amputation. Evaluated by podiatry, no infection.   Discharge Diagnoses:  Active Problems:   Iron deficiency anemia   Uncontrolled diabetes mellitus (HCC)   CVA (cerebral vascular accident) ([place])   Type 2 diabetes  mellitus without complication, with long-term current use of insulin (HCC)   ESRD (end stage renal disease) ([place])   Anemia   Acute on chronic respiratory failure with hypoxemia (HCC)   Aspiration pneumonia of both lower lobes due to gastric secretions [facility])    Discharge Instructions:  Allergies as of 01/04/2020      Reactions   Ferrous Gluconate  Nausea And Vomiting   Other       Medication List    STOP taking these medications   HYDROcodone-acetaminophen 5-325 MG tablet Commonly known as: NORCO/VICODIN   Prosol 20 % Soln     TAKE these medications   aspirin 81 MG EC tablet Take 1 tablet (81 mg total) by mouth daily.   carvedilol 6.25 MG tablet Commonly known as: COREG Take 6.25 mg by mouth 2 (two) times daily with a meal.   Entresto 24-26 MG Generic drug: sacubitril-valsartan Take 1 tablet by mouth 2 (two) times daily.   epoetin alfa 10000 UNIT/ML injection Commonly known as: EPOGEN Inject 0.4 mLs (4,000 Units total) into the vein Every Tuesday,Thursday,and Saturday with dialysis. Start taking on: January 05, 2020   feeding supplement (NEPRO CARB STEADY) Liqd Take 237 mLs by mouth 3 (three) times daily between meals.   folic acid 1 MG tablet Commonly known as: FOLVITE Take 1 mg by mouth daily.   hydrALAZINE 25 MG tablet Commonly known as: APRESOLINE Take 1 tablet (25 mg total) by mouth every 8 (eight) hours. What changed: when to take this   insulin glargine 100 UNIT/ML injection Commonly known as: LANTUS Inject 0.04 mLs (4 Units total) into the skin daily. What changed: how much to take   insulin lispro 100 UNIT/ML KwikPen Commonly known as: HumaLOG KwikPen Inject 0.03 mLs (3 Units total) into the skin 3 (three) times daily with meals. What changed:   how much to take  additional instructions   iron polysaccharides 150 MG capsule Commonly known as: NIFEREX Take 1 capsule (150 mg total) by mouth daily. Start taking on: January 05, 2020   melatonin 3 MG Tabs tablet Take 1 tablet by mouth at bedtime.   multivitamin Tabs tablet Take 1 tablet by mouth daily.   nicotine 7 mg/24hr patch Commonly known as: NICODERM CQ - dosed in mg/24 hr Place 1 patch (7 mg total) onto the skin daily. Start taking on: January 05, 2020   sertraline 100 MG tablet Commonly known as: ZOLOFT Take 100 mg by mouth  daily.   thiamine 100 MG tablet Commonly known as: VITAMIN B-1 Take 100 mg by mouth daily.            Durable Medical Equipment  (From admission, onward)         Start     Ordered   01/04/20 0957  For home use only DME oxygen  Once       Comments: liter flow change, up from 2L prior  Question Answer Comment  Length of Need 6 Months   Mode or (Route) Nasal cannula   Liters per Minute 4   Frequency Continuous (stationary and portable oxygen unit needed)   Oxygen delivery system Gas      01/04/20 0956           Follow-up Information    [name], MD Follow up.   Specialty: Internal Medicine Why: Please schedule hospital follow up appt within 3-5 days of discharge. Contact information: [address] [place] [address] [phone]  [facility]-Home Follow up.   Specialty: Home Health Services Why: They will follow up with you for your home health needs.              Allergies  Allergen Reactions  . Ferrous Gluconate Nausea And Vomiting  . Other      The results of significant diagnostics from this hospitalization (including imaging, microbiology, ancillary and laboratory) are listed below for reference.   Consultations:   Procedures/Studies: DG Chest 2 View  Result Date: 12/27/2019 CLINICAL DATA:  COPD, shortness of breath EXAM: CHEST - 2 VIEW COMPARISON:  12/24/2019 FINDINGS: Cardiac shadow is again enlarged. Aortic calcifications are seen. Dialysis catheter is again noted. Diffuse interstitial edema is again identified. Effusions have increased slightly in the interval from the prior exam. No bony abnormality is noted. IMPRESSION: Changes consistent with congestive failure with effusions and interstitial edema. The overall appearance is similar to that seen on the prior exam. Electronically Signed   By: Inez Catalina M.D.   On: 12/27/2019 08:34   DG Chest 2 View  Result Date:  12/24/2019 CLINICAL DATA:  Weakness, fatigue, shortness of breath EXAM: CHEST - 2 VIEW COMPARISON:  12/06/2019 FINDINGS: Right dialysis catheter remains in place, unchanged. Cardiomegaly. Diffuse interstitial opacities throughout the lungs with small effusions. Findings compatible with edema/CHF. No acute bony abnormality. IMPRESSION: Mild-to-moderate interstitial edema.  Small bilateral effusions. Electronically Signed   By: Rolm Baptise M.D.   On: 12/24/2019 18:16   CT ANGIO CHEST PE W OR WO CONTRAST  Result Date: 12/28/2019 CLINICAL DATA:  59 year old male with concern for pulmonary embolism. EXAM: CT ANGIOGRAPHY CHEST WITH CONTRAST TECHNIQUE: Multidetector CT imaging of the chest was performed using the standard protocol during bolus administration of intravenous contrast. Multiplanar CT image reconstructions and MIPs were obtained to evaluate the vascular anatomy. CONTRAST:  43mL OMNIPAQUE IOHEXOL 350 MG/ML SOLN COMPARISON:  Chest CT dated 12/06/2019. FINDINGS: Evaluation of this exam is limited due to respiratory motion artifact. Cardiovascular: There is moderate cardiomegaly. Small pericardial effusion measuring 11 mm in thickness anterior to the heart. There is coronary vascular calcification primarily involving the LAD and left coronary artery. There is mild atherosclerotic calcification of the thoracic aorta. No aneurysmal dilatation. There is dilatation the main pulmonary trunk suggestive of a degree of pulmonary hypertension. Evaluation of the pulmonary arteries is limited due to respiratory motion artifact. No pulmonary artery embolus identified. Mediastinum/Nodes: Mildly enlarged right hilar lymph node measured 2 cm. Additional mediastinal adenopathy noted but suboptimally visualized. There is nodular thickening of the anterior mediastinal border in the prevascular space (83/5), likely related to underlying adenopathy. The esophagus is poorly visualized. A right IJ catheter with tip at the  cavoatrial junction noted. Lungs/Pleura: Moderate bilateral pleural effusions with associated partial compressive atelectasis of the lower lobes. Pneumonia is not excluded. Clinical correlation is recommended. There is diffuse interstitial and interlobular septal prominence consistent with edema. There is no pneumothorax. Secretions noted within the trachea. The central airways are patent. Upper Abdomen: Diffuse pancreatic calcification consistent with chronic pancreatitis. Musculoskeletal: No chest wall abnormality. No acute or significant osseous findings. Review of the MIP images confirms the above findings. IMPRESSION: 1. No CT evidence of pulmonary artery embolus. 2. Moderate cardiomegaly with findings of CHF and moderate bilateral pleural effusions. 3. Bilateral lower lobe partial compressive atelectasis. Pneumonia is not excluded. Clinical correlation is recommended. 4. Aortic Atherosclerosis (ICD10-I70.0). Electronically Signed   By: Anner Crete M.D.   On: 12/28/2019 17:21   CT  Angio Chest PE W and/or Wo Contrast  Result Date: 12/06/2019 CLINICAL DATA:  High clinical suspicion of pulmonary embolism. End-stage renal disease on hemodialysis for 2 years. EXAM: CT ANGIOGRAPHY CHEST WITH CONTRAST TECHNIQUE: Multidetector CT imaging of the chest was performed using the standard protocol during bolus administration of intravenous contrast. Multiplanar CT image reconstructions and MIPs were obtained to evaluate the vascular anatomy. CONTRAST:  44mL OMNIPAQUE IOHEXOL 350 MG/ML SOLN COMPARISON:  Noncontrast chest CT 05/27/2019. Abdominal CT 07/17/2019. FINDINGS: Cardiovascular: The pulmonary arteries are well opacified with contrast to the level of the subsegmental branches. There is no evidence of acute pulmonary embolism. Atherosclerosis of the aorta, great vessels and coronary arteries. There is limited opacification of the systemic vessels, but no acute vascular findings are seen. The heart is mildly  enlarged. There is a small pericardial effusion. Right IJ hemodialysis catheter extends to the superior cavoatrial junction. Mediastinum/Nodes: Mildly progressive mediastinal and hilar adenopathy, largest a right paratracheal node measuring 16 mm on image 21/4. No axillary adenopathy. The thyroid gland, trachea and esophagus demonstrate no significant findings. Lungs/Pleura: Moderate-sized dependent pleural effusions bilaterally with associated bibasilar atelectasis. In addition, there are diffusely increased septal markings throughout both lungs, suspicious for edema superimposed on emphysema. No confluent airspace opacity or suspicious pulmonary nodularity. Upper abdomen: Enlarging, incompletely visualized fluid collection in the left upper quadrant of the abdomen, measuring up to 8.0 x 5.2 cm on image 94/4. This lies between the stomach and spleen and has enlarged compared with previous abdominal CT where there was a much smaller collection adjacent to a calcified pancreatic tail. Appearance suggests a pancreatic pseudocyst. Generalized soft tissue edema throughout the upper abdomen without other focal fluid collection. Musculoskeletal/Chest wall: There is no chest wall mass or suspicious osseous finding. Generalized soft tissue edema consistent with anasarca/volume overload. Review of the MIP images confirms the above findings. IMPRESSION: 1. No evidence of acute pulmonary embolism. 2. Cardiomegaly, bilateral pleural effusions and diffuse septal thickening in both lungs most consistent with congestive heart failure/volume overload. Associated generalized soft tissue edema. 3. Mildly progressive mediastinal and hilar adenopathy, likely reactive. 4. Enlarging, incompletely visualized fluid collection in the left upper quadrant of the abdomen, suspicious for a pancreatic pseudocyst. 5. Aortic Atherosclerosis (ICD10-I70.0) and Emphysema (ICD10-J43.9). Electronically Signed   By: Richardean Sale M.D.   On: 12/06/2019  15:37   MR FOOT LEFT WO CONTRAST  Result Date: 12/08/2019 CLINICAL DATA:  Question of osteomyelitis of the second toe diabetic foot infection EXAM: MRI OF THE LEFT FOOT WITHOUT CONTRAST TECHNIQUE: Multiplanar, multisequence MR imaging of the left was performed. No intravenous contrast was administered. COMPARISON:  December 07, 2019 radiograph FINDINGS: Bones/Joint/Cartilage There is cortical irregularity with erosive type changes seen through the distal second phalanx. There is T2 hyperintense signal with T1 hypointensity seen throughout the distal phalanx. There is increased T2 hyperintense signal seen within the middle phalanx of the second digit without T1 hypointensity or cortical irregularity. Erosive type change or prior osteotomies are seen at the fourth and third MTP joints. There is also mildly increased signal seen within the metatarsals and midfoot without associated T1 hypointensity. Ligaments The Lisfranc ligaments appear to be intact. Muscles and Tendons Fatty atrophy with increased signal seen throughout the muscles of the forefoot. The flexor and extensor tendons appear to be grossly intact. Soft tissues Area of ulceration seen overlying distal second phalanx with diffuse soft tissue edema and skin thickening. No loculated fluid collection or sinus tract is seen. There is mild dorsal  soft tissue swelling seen over the forefoot. IMPRESSION: 1. Area of ulceration over the distal second digit with findings of acute osteomyelitis involving the distal phalanx. No loculated fluid collections or sinus tract. 2. Reactive marrow edema within the middle phalanx of the second digit and midfoot. Electronically Signed   By: Prudencio Pair M.D.   On: 12/08/2019 21:54   PERIPHERAL VASCULAR CATHETERIZATION  Result Date: 12/10/2019 See op note  US ARTERIAL ABI (SCREENING LOWER EXTREMITY)  Result Date: 12/08/2019 CLINICAL DATA:  Left second toe infection. Diabetes, hypertension, hyperlipidemia, renal failure,  history of tobacco abuse EXAM: NONINVASIVE PHYSIOLOGIC VASCULAR STUDY OF BILATERAL LOWER EXTREMITIES TECHNIQUE: Evaluation of both lower extremities were performed at rest, including calculation of ankle-brachial indices with single level Doppler, pressure and pulse volume recording. COMPARISON:  None available FINDINGS: Right ABI: Non calculable due to vascular noncompressibility. Toe brachial index 0.5 Left ABI: Non calculable due to vascular noncompressibility. Toe brachial index 0.45 Right Lower Extremity:  Biphasic distal arterial waveforms Left Lower Extremity:  Biphasic distal arterial waveforms IMPRESSION: ABIs are non diagnostic secondary to incompressible vessel calcifications (medial arterial sclerosis of [place]ckeberg). Decreased toe brachial indices consistent with at least moderate bilateral arterial occlusive disease at rest. Electronically Signed   By: Lucrezia Europe M.D.   On: 12/08/2019 13:13   DG Chest Port 1 View  Result Date: 01/02/2020 CLINICAL DATA:  Shortness of breath. EXAM: PORTABLE CHEST 1 VIEW COMPARISON:  12/31/2019 and prior studies FINDINGS: Cardiomegaly and diffuse bilateral airspace/interstitial opacities again noted. Small bilateral pleural effusions are unchanged. A RIGHT IJ central venous catheter is again noted with tip overlying the SUPERIOR cavoatrial junction. No pneumothorax noted. IMPRESSION: Unchanged appearance of the chest with diffuse bilateral airspace/interstitial opacities and small bilateral pleural effusions. Electronically Signed   By: Margarette Canada M.D.   On: 01/02/2020 11:28   DG Chest Port 1 View  Result Date: 12/31/2019 CLINICAL DATA:  Hypoxia EXAM: PORTABLE CHEST 1 VIEW COMPARISON:  12/27/2018, CT chest 12/28/2018 FINDINGS: Right-sided central venous catheter tip over the distal SVC. Cardiomegaly with at least small bilateral effusions. Vascular congestion with extensive interstitial and ground-glass disease most likely pulmonary edema. Bibasilar  consolidations. Aortic atherosclerosis. No pneumothorax. IMPRESSION: Cardiomegaly with vascular congestion, extensive interstitial and ground-glass disease most likely pulmonary edema. There are at least small bilateral effusions. Bibasilar consolidations. Electronically Signed   By: Donavan Foil M.D.   On: 12/31/2019 19:49   DG Chest Portable 1 View  Result Date: 12/06/2019 CLINICAL DATA:  12-year-old male with history of cough and hypoxia. EXAM: PORTABLE CHEST 1 VIEW COMPARISON:  Chest x-ray 09/16/2019. FINDINGS: Right-sided PermCath with tip terminating at the superior cavoatrial junction. There is cephalization of the pulmonary vasculature and slight indistinctness of the interstitial markings suggestive of mild pulmonary edema. Small left pleural effusion. No right pleural effusion. No pneumothorax. Heart size is mildly enlarged. Upper mediastinal contours are within normal limits. Aortic atherosclerosis. IMPRESSION: 1. The appearance of the chest suggests mild congestive heart failure, as above. 2. Aortic atherosclerosis. Electronically Signed   By: Vinnie Langton M.D.   On: 12/06/2019 13:22   DG Foot Complete Left  Result Date: 12/07/2019 CLINICAL DATA:  59 year old male with diabetic foot infection. EXAM: LEFT FOOT - COMPLETE 3+ VIEW COMPARISON:  Left ankle radiograph dated 11/09/2019. FINDINGS: There is no acute fracture or dislocation. The bones are osteopenic. Erosive changes of the middle and distal phalanx of the second toe, progressed since the prior radiograph and concerning for osteomyelitis. Clinical correlation is  recommended. There are erosive changes versus osteotomy of the third and fourth MTP joints similar to prior radiograph. There is soft tissue swelling and small pockets of soft tissue air in the distal second toe in keeping with ulcer. Vascular calcifications noted. IMPRESSION: 1. No acute fracture or dislocation. 2. Ulceration of the distal aspect of the second toe with findings  concerning for osteomyelitis of the middle and distal phalanges of the second toe. MRI or a WBC nuclear scan may provide better evaluation. Electronically Signed   By: Anner Crete M.D.   On: 12/07/2019 19:05   DG Foot Complete Right  Result Date: 12/07/2019 CLINICAL DATA:  Bilateral diabetic foot infection. Bilateral foot abscess. EXAM: RIGHT FOOT COMPLETE - 3+ VIEW COMPARISON:  Remote right foot exam 03/01/2018 FINDINGS: No bony destruction, periosteal reaction, or abnormal bone density to suggest osteomyelitis. No visualized large soft tissue defects or soft tissue air. Osteoarthritis of the first metatarsal phalangeal joint again seen, slight progression from 2019. slight hammertoe deformity of the fourth digit. Alignment otherwise maintained. There are vascular calcifications. IMPRESSION: 1. No radiographic evidence of osteomyelitis. No visualized large soft tissue defects/ulcer or soft tissue air. 2. Osteoarthritis of the first metatarsophalangeal joint, slightly progressed from 2019. 3. Vascular calcifications. Electronically Signed   By: Keith Rake M.D.   On: 12/07/2019 19:01   ECHOCARDIOGRAM COMPLETE  Result Date: 12/07/2019    ECHOCARDIOGRAM REPORT   Patient Name:   [name] Date of Exam: 12/07/2019 Medical Rec #:  [id number]     Height:       71.0 in Accession #:    [id number]    Weight:       126.4 lb Date of Birth:  02-05-1961     BSA:          1.735 m Patient Age:    59 years      BP:           136/75 mmHg Patient Gender: M             HR:           85 bpm. Exam Location:  [facility] Procedure: 2D Echo, Color Doppler and Cardiac Doppler Indications:     CHF 428.0  History:         Patient has no prior history of Echocardiogram examinations.                  Stroke; Risk Factors:Hypertension, Diabetes and Dyslipidemia.                  CKD, ETOH abuse.  Sonographer:     Sherrie Sport RDCS (AE) Referring Phys:  [id number] [place] Diagnosing Phys: Isaias Cowman MD  IMPRESSIONS  1. Left ventricular ejection fraction, by estimation, is 45 to 50%. The left ventricle has mildly decreased function. The left ventricle has no regional wall motion abnormalities. Left ventricular diastolic parameters were normal.  2. Right ventricular systolic function is normal. The right ventricular size is normal. There is severely elevated pulmonary artery systolic pressure.  3. Left atrial size was moderately dilated.  4. The mitral valve is normal in structure. Moderate to severe mitral valve regurgitation. No evidence of mitral stenosis.  5. Tricuspid valve regurgitation is moderate.  6. The aortic valve is normal in structure. Aortic valve regurgitation is mild. No aortic stenosis is present.  7. The inferior vena cava is normal in size with greater than 50% respiratory variability, suggesting right atrial pressure of 3  mmHg. FINDINGS  Left Ventricle: Left ventricular ejection fraction, by estimation, is 45 to 50%. The left ventricle has mildly decreased function. The left ventricle has no regional wall motion abnormalities. The left ventricular internal cavity size was normal in size. There is no left ventricular hypertrophy. Left ventricular diastolic parameters were normal. Right Ventricle: The right ventricular size is normal. No increase in right ventricular wall thickness. Right ventricular systolic function is normal. There is severely elevated pulmonary artery systolic pressure. The tricuspid regurgitant velocity is 3.56 m/s, and with an assumed right atrial pressure of 10 mmHg, the estimated right ventricular systolic pressure is 97.3 mmHg. Left Atrium: Left atrial size was moderately dilated. Right Atrium: Right atrial size was normal in size. Pericardium: There is no evidence of pericardial effusion. Mitral Valve: The mitral valve is normal in structure. Normal mobility of the mitral valve leaflets. Moderate to severe mitral valve regurgitation. No evidence of mitral valve stenosis.  Tricuspid Valve: The tricuspid valve is normal in structure. Tricuspid valve regurgitation is moderate . No evidence of tricuspid stenosis. Aortic Valve: The aortic valve is normal in structure. Aortic valve regurgitation is mild. No aortic stenosis is present. Aortic valve mean gradient measures 4.0 mmHg. Aortic valve peak gradient measures 6.8 mmHg. Aortic valve area, by VTI measures 2.49 cm. Pulmonic Valve: The pulmonic valve was normal in structure. Pulmonic valve regurgitation is not visualized. No evidence of pulmonic stenosis. Aorta: The aortic root is normal in size and structure. Venous: The inferior vena cava is normal in size with greater than 50% respiratory variability, suggesting right atrial pressure of 3 mmHg. IAS/Shunts: No atrial level shunt detected by color flow Doppler.  LEFT VENTRICLE PLAX 2D LVIDd:         5.30 cm      Diastology LVIDs:         4.03 cm      LV e' lateral:   10.10 cm/s LV PW:         1.13 cm      LV E/e' lateral: 10.1 LV IVS:        1.07 cm      LV e' medial:    6.74 cm/s LVOT diam:     2.10 cm      LV E/e' medial:  15.1 LV SV:         58 LV SV Index:   33 LVOT Area:     3.46 cm  LV Volumes (MOD) LV vol d, MOD A2C: 111.0 ml LV vol d, MOD A4C: 129.0 ml LV vol s, MOD A2C: 61.4 ml LV vol s, MOD A4C: 60.0 ml LV SV MOD A2C:     49.6 ml LV SV MOD A4C:     129.0 ml LV SV MOD BP:      59.1 ml RIGHT VENTRICLE RV Basal diam:  3.39 cm RV S prime:     11.30 cm/s TAPSE (M-mode): 4.5 cm LEFT ATRIUM            Index       RIGHT ATRIUM           Index LA diam:      4.50 cm  2.59 cm/m  RA Area:     15.20 cm LA Vol (A2C): 130.0 ml 74.93 ml/m RA Volume:   34.90 ml  20.12 ml/m LA Vol (A4C): 38.6 ml  22.25 ml/m  AORTIC VALVE  PULMONIC VALVE AV Area (Vmax):    1.92 cm    PV Vmax:        0.47 m/s AV Area (Vmean):   2.04 cm    PV Peak grad:   0.9 mmHg AV Area (VTI):     2.49 cm    RVOT Peak grad: 3 mmHg AV Vmax:           130.50 cm/s AV Vmean:          92.850 cm/s AV VTI:             0.232 m AV Peak Grad:      6.8 mmHg AV Mean Grad:      4.0 mmHg LVOT Vmax:         72.50 cm/s LVOT Vmean:        54.800 cm/s LVOT VTI:          0.167 m LVOT/AV VTI ratio: 0.72  AORTA Ao Root diam: 2.60 cm MITRAL VALVE                TRICUSPID VALVE MV Area (PHT): 5.34 cm     TR Peak grad:   50.7 mmHg MV Decel Time: 142 msec     TR Vmax:        356.00 cm/s MV E velocity: 102.00 cm/s MV A velocity: 41.10 cm/s   SHUNTS MV E/A ratio:  2.48         Systemic VTI:  0.17 m                             Systemic Diam: 2.10 cm Isaias Cowman MD Electronically signed by Isaias Cowman MD Signature Date/Time: 12/07/2019/4:48:11 PM    Final       Labs: BNP (last 3 results) Recent Labs    07/05/19 2156 09/16/19 1939 12/07/19 0639  BNP 2,097.0* 1,162.0* >9,833.8*   Basic Metabolic Panel: Recent Labs  Lab 12/31/19 0403 12/31/19 0403 01/01/20 0436 01/01/20 1624 01/02/20 0450 01/03/20 0433 01/04/20 0923  NA 141  --  136  --  140 134* 139  K 4.8  --  4.1  --  4.0 3.7 4.2  CL 103  --  96*  --  98 95* 101  CO2 25  --  28  --  27 27 26   GLUCOSE 56*   < > 327* 158* 315* 290* 119*  BUN 37*  --  19  --  24* 14 26*  CREATININE 5.11*  --  3.56*  --  4.37* 2.99* 4.17*  CALCIUM 8.4*  --  8.1*  --  8.2* 8.0* 8.1*  MG 2.4  --  2.0  --  2.0 1.9 2.1   < > = values in this interval not displayed.   Liver Function Tests: No results for input(s): AST, ALT, ALKPHOS, BILITOT, PROT, ALBUMIN in the last 168 hours. No results for input(s): LIPASE, AMYLASE in the last 168 hours. No results for input(s): AMMONIA in the last 168 hours. CBC: Recent Labs  Lab 12/29/19 0430 12/29/19 0430 12/30/19 0436 12/30/19 0436 12/31/19 0403 01/01/20 0436 01/02/20 0450 01/03/20 0433 01/04/20 0923  WBC 5.7   < > 5.4   < > 5.5 4.8 4.6 5.5 4.7  NEUTROABS 3.9  --  3.7  --   --   --   --   --   --   HGB 7.9*   < > 7.8*   < > 8.5* 8.2*  8.7* 9.1* 8.5*  HCT 24.0*   < > 25.1*   < > 26.2* 26.4* 26.7* 28.3* 27.5*   MCV 93.4   < > 97.7   < > 94.9 98.1 94.3 94.6 98.2  PLT 219   < > 219   < > 225 194 202 185 186   < > = values in this interval not displayed.   Cardiac Enzymes: No results for input(s): CKTOTAL, CKMB, CKMBINDEX, TROPONINI in the last 168 hours. BNP: Invalid input(s): POCBNP CBG: Recent Labs  Lab 01/03/20 0754 01/03/20 1150 01/03/20 1637 01/03/20 2155 01/04/20 0747  GLUCAP 246* 219* 135* 134* 72   D-Dimer No results for input(s): DDIMER in the last 72 hours. Hgb A1c No results for input(s): HGBA1C in the last 72 hours. Lipid Profile No results for input(s): CHOL, HDL, LDLCALC, TRIG, CHOLHDL, LDLDIRECT in the last 72 hours. Thyroid function studies No results for input(s): TSH, T4TOTAL, T3FREE, THYROIDAB in the last 72 hours.  Invalid input(s): FREET3 Anemia work up No results for input(s): VITAMINB12, FOLATE, FERRITIN, TIBC, IRON, RETICCTPCT in the last 72 hours. Urinalysis    Component Value Date/Time   COLORURINE YELLOW (A) 07/06/2019 0144   APPEARANCEUR CLOUDY (A) 07/06/2019 0144   APPEARANCEUR Clear 01/17/2014 1551   LABSPEC 1.017 07/06/2019 0144   LABSPEC 1.011 01/17/2014 1551   PHURINE 7.0 07/06/2019 0144   GLUCOSEU >=500 (A) 07/06/2019 0144   GLUCOSEU Negative 01/17/2014 1551   HGBUR SMALL (A) 07/06/2019 0144   BILIRUBINUR NEGATIVE 07/06/2019 0144   BILIRUBINUR Negative 01/17/2014 1551   KETONESUR NEGATIVE 07/06/2019 0144   PROTEINUR >=300 (A) 07/06/2019 0144   NITRITE NEGATIVE 07/06/2019 0144   LEUKOCYTESUR NEGATIVE 07/06/2019 0144   LEUKOCYTESUR 1+ 01/17/2014 1551   Sepsis Labs Invalid input(s): PROCALCITONIN,  WBC,  LACTICIDVEN Microbiology No results found for this or any previous visit (from the past 240 hour(s)).   Total time spend on discharging this patient, including the last patient exam, discussing the hospital stay, instructions for ongoing care as it relates to all pertinent caregivers, as well as preparing the medical discharge records,  prescriptions, and/or referrals as applicable, is 35 minutes.    Enzo Bi, MD  [facility] 01/04/2020, 10:49 AM  If 7PM-7AM, please contact night-coverage

## 2020-01-04 NOTE — TOC Transition Note (Signed)
Transition of Care [facility]) - CM/SW Discharge Note   Patient Details  Name: [name] MRN: [id number] Date of Birth: 07/12/1960  Transition of [facility]) CM/SW Contact:  Candie Chroman, LCSW Phone Number: 01/04/2020, 11:28 AM   Clinical Narrative: Patient has orders to discharge home today after HD. Patient is aware and confirmed his sister would likely take him home. CSW called sister to update her. She will call wife about his oxygen. No further concerns. CSW signing off.  Final next level of care: [place] Barriers to Discharge: Barriers Resolved   Patient Goals and CMS Choice     Choice offered to / list presented to : Patient  Discharge Placement                Patient to be transferred to facility by: Sister will take him home Name of family member notified: [name] Patient and family notified of of transfer: 01/04/20  Discharge Plan and Services     Post Acute Care Choice: Home Health          DME Arranged: Oxygen DME Agency: [facility] Date DME Agency Contacted: 01/04/20     HH Arranged: RN, PT, OT [place] Agency: [place] ([place]) Date HH Agency Contacted: 01/04/20   Representative spoke with at [place]: Floydene Flock  Social Determinants of Health ([place]) Interventions     Readmission Risk Interventions Readmission Risk Prevention Plan 12/29/2019 12/09/2019 07/07/2019  Transportation Screening Complete Complete Complete  PCP or Specialist Appt within 3-5 Days - - Complete  HRI or [place] - - Complete  Palliative Care Screening - - -  Medication Review (RN Care Manager) Complete Complete Complete  PCP or Specialist appointment within 3-5 days of discharge Complete Complete -  HRI or Home Care Consult Complete Complete -  SW Recovery Care/Counseling Consult Complete Complete -  Palliative Care Screening Not Applicable Not Applicable -  [place] Not Applicable Not Applicable -  Some  recent data might be hidden

## 2020-01-04 NOTE — Progress Notes (Signed)
Hd completed 

## 2020-01-05 DIAGNOSIS — Z89422 Acquired absence of other left toe(s): Secondary | ICD-10-CM | POA: Insufficient documentation

## 2020-01-09 ENCOUNTER — Emergency Department: Payer: Medicare Other

## 2020-01-09 ENCOUNTER — Encounter: Payer: Self-pay | Admitting: Emergency Medicine

## 2020-01-09 ENCOUNTER — Inpatient Hospital Stay
Admission: EM | Admit: 2020-01-09 | Discharge: 2020-01-13 | DRG: 291 | Disposition: A | Discharge status: Home Health | Payer: Medicare Other | Attending: Internal Medicine | Admitting: Internal Medicine

## 2020-01-09 ENCOUNTER — Other Ambulatory Visit: Payer: Self-pay

## 2020-01-09 DIAGNOSIS — E785 Hyperlipidemia, unspecified: Secondary | ICD-10-CM | POA: Diagnosis present

## 2020-01-09 DIAGNOSIS — Z8673 Personal history of transient ischemic attack (TIA), and cerebral infarction without residual deficits: Secondary | ICD-10-CM

## 2020-01-09 DIAGNOSIS — K219 Gastro-esophageal reflux disease without esophagitis: Secondary | ICD-10-CM | POA: Diagnosis present

## 2020-01-09 DIAGNOSIS — Z8701 Personal history of pneumonia (recurrent): Secondary | ICD-10-CM | POA: Diagnosis not present

## 2020-01-09 DIAGNOSIS — Z992 Dependence on renal dialysis: Secondary | ICD-10-CM

## 2020-01-09 DIAGNOSIS — D631 Anemia in chronic kidney disease: Secondary | ICD-10-CM | POA: Diagnosis present

## 2020-01-09 DIAGNOSIS — I472 Ventricular tachycardia: Secondary | ICD-10-CM | POA: Diagnosis present

## 2020-01-09 DIAGNOSIS — Z79899 Other long term (current) drug therapy: Secondary | ICD-10-CM

## 2020-01-09 DIAGNOSIS — I272 Pulmonary hypertension, unspecified: Secondary | ICD-10-CM | POA: Diagnosis present

## 2020-01-09 DIAGNOSIS — D509 Iron deficiency anemia, unspecified: Secondary | ICD-10-CM | POA: Diagnosis present

## 2020-01-09 DIAGNOSIS — F1010 Alcohol abuse, uncomplicated: Secondary | ICD-10-CM | POA: Diagnosis present

## 2020-01-09 DIAGNOSIS — Z9114 Patient's other noncompliance with medication regimen: Secondary | ICD-10-CM

## 2020-01-09 DIAGNOSIS — N186 End stage renal disease: Secondary | ICD-10-CM | POA: Diagnosis present

## 2020-01-09 DIAGNOSIS — J9621 Acute and chronic respiratory failure with hypoxia: Secondary | ICD-10-CM | POA: Diagnosis present

## 2020-01-09 DIAGNOSIS — I132 Hypertensive heart and chronic kidney disease with heart failure and with stage 5 chronic kidney disease, or end stage renal disease: Secondary | ICD-10-CM | POA: Diagnosis present

## 2020-01-09 DIAGNOSIS — Z86010 Personal history of colonic polyps: Secondary | ICD-10-CM

## 2020-01-09 DIAGNOSIS — Z720 Tobacco use: Secondary | ICD-10-CM | POA: Diagnosis present

## 2020-01-09 DIAGNOSIS — F17210 Nicotine dependence, cigarettes, uncomplicated: Secondary | ICD-10-CM | POA: Diagnosis present

## 2020-01-09 DIAGNOSIS — R0602 Shortness of breath: Secondary | ICD-10-CM | POA: Diagnosis present

## 2020-01-09 DIAGNOSIS — E1165 Type 2 diabetes mellitus with hyperglycemia: Secondary | ICD-10-CM | POA: Diagnosis present

## 2020-01-09 DIAGNOSIS — IMO0002 Reserved for concepts with insufficient information to code with codable children: Secondary | ICD-10-CM | POA: Diagnosis present

## 2020-01-09 DIAGNOSIS — Z9111 Patient's noncompliance with dietary regimen: Secondary | ICD-10-CM

## 2020-01-09 DIAGNOSIS — Z888 Allergy status to other drugs, medicaments and biological substances status: Secondary | ICD-10-CM

## 2020-01-09 DIAGNOSIS — Z89422 Acquired absence of other left toe(s): Secondary | ICD-10-CM

## 2020-01-09 DIAGNOSIS — I340 Nonrheumatic mitral (valve) insufficiency: Secondary | ICD-10-CM | POA: Diagnosis present

## 2020-01-09 DIAGNOSIS — Z681 Body mass index (BMI) 19 or less, adult: Secondary | ICD-10-CM | POA: Diagnosis not present

## 2020-01-09 DIAGNOSIS — I5023 Acute on chronic systolic (congestive) heart failure: Secondary | ICD-10-CM | POA: Diagnosis present

## 2020-01-09 DIAGNOSIS — Z7982 Long term (current) use of aspirin: Secondary | ICD-10-CM

## 2020-01-09 DIAGNOSIS — I10 Essential (primary) hypertension: Secondary | ICD-10-CM | POA: Diagnosis present

## 2020-01-09 DIAGNOSIS — Z20822 Contact with and (suspected) exposure to covid-19: Secondary | ICD-10-CM | POA: Diagnosis present

## 2020-01-09 DIAGNOSIS — E118 Type 2 diabetes mellitus with unspecified complications: Secondary | ICD-10-CM | POA: Diagnosis present

## 2020-01-09 DIAGNOSIS — N2581 Secondary hyperparathyroidism of renal origin: Secondary | ICD-10-CM | POA: Diagnosis present

## 2020-01-09 DIAGNOSIS — Z794 Long term (current) use of insulin: Secondary | ICD-10-CM

## 2020-01-09 DIAGNOSIS — E1122 Type 2 diabetes mellitus with diabetic chronic kidney disease: Secondary | ICD-10-CM | POA: Diagnosis present

## 2020-01-09 DIAGNOSIS — Z8249 Family history of ischemic heart disease and other diseases of the circulatory system: Secondary | ICD-10-CM

## 2020-01-09 DIAGNOSIS — E43 Unspecified severe protein-calorie malnutrition: Secondary | ICD-10-CM | POA: Diagnosis present

## 2020-01-09 DIAGNOSIS — I509 Heart failure, unspecified: Secondary | ICD-10-CM | POA: Diagnosis not present

## 2020-01-09 DIAGNOSIS — E876 Hypokalemia: Secondary | ICD-10-CM | POA: Diagnosis present

## 2020-01-09 DIAGNOSIS — Z841 Family history of disorders of kidney and ureter: Secondary | ICD-10-CM

## 2020-01-09 LAB — CBC WITH DIFFERENTIAL/PLATELET
Abs Immature Granulocytes: 0.01 10*3/uL (ref 0.00–0.07)
Basophils Absolute: 0 10*3/uL (ref 0.0–0.1)
Basophils Relative: 0 %
Eosinophils Absolute: 0.1 10*3/uL (ref 0.0–0.5)
Eosinophils Relative: 1 %
HCT: 23.1 % — ABNORMAL LOW (ref 39.0–52.0)
Hemoglobin: 7.4 g/dL — ABNORMAL LOW (ref 13.0–17.0)
Immature Granulocytes: 0 %
Lymphocytes Relative: 11 %
Lymphs Abs: 0.7 10*3/uL (ref 0.7–4.0)
MCH: 30.8 pg (ref 26.0–34.0)
MCHC: 32 g/dL (ref 30.0–36.0)
MCV: 96.3 fL (ref 80.0–100.0)
Monocytes Absolute: 0.4 10*3/uL (ref 0.1–1.0)
Monocytes Relative: 6 %
Neutro Abs: 5 10*3/uL (ref 1.7–7.7)
Neutrophils Relative %: 82 %
Platelets: 147 10*3/uL — ABNORMAL LOW (ref 150–400)
RBC: 2.4 MIL/uL — ABNORMAL LOW (ref 4.22–5.81)
RDW: 14.6 % (ref 11.5–15.5)
WBC: 6.1 10*3/uL (ref 4.0–10.5)
nRBC: 0 % (ref 0.0–0.2)

## 2020-01-09 LAB — CBC
HCT: 20.7 % — ABNORMAL LOW (ref 39.0–52.0)
Hemoglobin: 6.7 g/dL — ABNORMAL LOW (ref 13.0–17.0)
MCH: 30.3 pg (ref 26.0–34.0)
MCHC: 32.4 g/dL (ref 30.0–36.0)
MCV: 93.7 fL (ref 80.0–100.0)
Platelets: 144 10*3/uL — ABNORMAL LOW (ref 150–400)
RBC: 2.21 MIL/uL — ABNORMAL LOW (ref 4.22–5.81)
RDW: 15 % (ref 11.5–15.5)
WBC: 5 10*3/uL (ref 4.0–10.5)
nRBC: 0 % (ref 0.0–0.2)

## 2020-01-09 LAB — BASIC METABOLIC PANEL
Anion gap: 9 (ref 5–15)
BUN: 39 mg/dL — ABNORMAL HIGH (ref 6–20)
CO2: 26 mmol/L (ref 22–32)
Calcium: 8.2 mg/dL — ABNORMAL LOW (ref 8.9–10.3)
Chloride: 96 mmol/L — ABNORMAL LOW (ref 98–111)
Creatinine, Ser: 3.29 mg/dL — ABNORMAL HIGH (ref 0.61–1.24)
GFR calc Af Amer: 22 mL/min — ABNORMAL LOW (ref >60)
GFR calc non Af Amer: 19 mL/min — ABNORMAL LOW (ref >60)
Glucose, Bld: 465 mg/dL — ABNORMAL HIGH (ref 70–99)
Potassium: 3.6 mmol/L (ref 3.5–5.1)
Sodium: 131 mmol/L — ABNORMAL LOW (ref 135–145)

## 2020-01-09 LAB — CREATININE, SERUM
Creatinine, Ser: 2.19 mg/dL — ABNORMAL HIGH (ref 0.61–1.24)
GFR calc Af Amer: 37 mL/min — ABNORMAL LOW (ref >60)
GFR calc non Af Amer: 32 mL/min — ABNORMAL LOW (ref >60)

## 2020-01-09 LAB — SARS CORONAVIRUS 2 BY RT PCR (HOSPITAL ORDER, PERFORMED IN ~~LOC~~ HOSPITAL LAB): SARS Coronavirus 2: NEGATIVE

## 2020-01-09 LAB — BRAIN NATRIURETIC PEPTIDE: B Natriuretic Peptide: >4885 pg/mL — ABNORMAL HIGH (ref 0.0–100.0)

## 2020-01-09 LAB — GLUCOSE, CAPILLARY: Glucose-Capillary: 316 mg/dL — ABNORMAL HIGH (ref 70–99)

## 2020-01-09 MED ORDER — INSULIN LISPRO (1 UNIT DIAL) 100 UNIT/ML (KWIKPEN): 3.0000 [IU] | PEN_INJECTOR | Freq: Three times a day (TID) | SUBCUTANEOUS | Status: DC

## 2020-01-09 MED ORDER — INSULIN GLARGINE 100 UNIT/ML ~~LOC~~ SOLN
8.0000 [IU] | Freq: Every day | SUBCUTANEOUS | Status: DC
  Administered 2020-01-10 – 2020-01-13 (×4): 8 [IU] via SUBCUTANEOUS
  Filled 2020-01-09 (×4): qty 0.08

## 2020-01-09 MED ORDER — PENTAFLUOROPROP-TETRAFLUOROETH EX AERO
1.0000 "application " | INHALATION_SPRAY | CUTANEOUS | Status: DC | PRN
  Filled 2020-01-09: qty 30

## 2020-01-09 MED ORDER — CHLORHEXIDINE GLUCONATE CLOTH 2 % EX PADS
6.0000 | MEDICATED_PAD | Freq: Every day | CUTANEOUS | Status: DC
  Administered 2020-01-11: 6 via TOPICAL
  Filled 2020-01-09 (×2): qty 6

## 2020-01-09 MED ORDER — INSULIN ASPART 100 UNIT/ML ~~LOC~~ SOLN: 0.0000 [IU] | Freq: Every day | SUBCUTANEOUS | Status: DC

## 2020-01-09 MED ORDER — HYDRALAZINE HCL 50 MG PO TABS
25.0000 mg | ORAL_TABLET | Freq: Three times a day (TID) | ORAL | Status: DC
  Administered 2020-01-09: 25 mg via ORAL
  Filled 2020-01-09 (×2): qty 1

## 2020-01-09 MED ORDER — MELATONIN 5 MG PO TABS
5.0000 mg | ORAL_TABLET | Freq: Every day | ORAL | Status: DC
  Administered 2020-01-10 – 2020-01-12 (×4): 5 mg via ORAL
  Filled 2020-01-09 (×4): qty 1

## 2020-01-09 MED ORDER — CARVEDILOL 6.25 MG PO TABS
6.2500 mg | ORAL_TABLET | Freq: Two times a day (BID) | ORAL | Status: DC
  Administered 2020-01-09 – 2020-01-13 (×8): 6.25 mg via ORAL
  Filled 2020-01-09 (×8): qty 1

## 2020-01-09 MED ORDER — INSULIN ASPART 100 UNIT/ML ~~LOC~~ SOLN
0.0000 [IU] | Freq: Three times a day (TID) | SUBCUTANEOUS | Status: DC
  Administered 2020-01-09: 7 [IU] via SUBCUTANEOUS
  Administered 2020-01-10: 5 [IU] via SUBCUTANEOUS
  Filled 2020-01-09 (×2): qty 1

## 2020-01-09 MED ORDER — INSULIN GLARGINE 100 UNIT/ML ~~LOC~~ SOLN
8.0000 [IU] | Freq: Every day | SUBCUTANEOUS | Status: DC
  Filled 2020-01-09 (×2): qty 0.08

## 2020-01-09 MED ORDER — INSULIN ASPART 100 UNIT/ML ~~LOC~~ SOLN
3.0000 [IU] | Freq: Three times a day (TID) | SUBCUTANEOUS | Status: DC
  Administered 2020-01-09 – 2020-01-10 (×2): 3 [IU] via SUBCUTANEOUS
  Filled 2020-01-09 (×2): qty 1

## 2020-01-09 MED ORDER — RENA-VITE PO TABS
1.0000 | ORAL_TABLET | Freq: Every day | ORAL | Status: DC
  Administered 2020-01-10 – 2020-01-13 (×4): 1 via ORAL
  Filled 2020-01-09 (×5): qty 1

## 2020-01-09 MED ORDER — HEPARIN SODIUM (PORCINE) 1000 UNIT/ML DIALYSIS
1000.0000 [IU] | INTRAMUSCULAR | Status: DC | PRN
  Filled 2020-01-09: qty 1

## 2020-01-09 MED ORDER — POLYSACCHARIDE IRON COMPLEX 150 MG PO CAPS
150.0000 mg | ORAL_CAPSULE | Freq: Every day | ORAL | Status: DC
  Administered 2020-01-11 – 2020-01-13 (×3): 150 mg via ORAL
  Filled 2020-01-09 (×5): qty 1

## 2020-01-09 MED ORDER — SACUBITRIL-VALSARTAN 24-26 MG PO TABS
1.0000 | ORAL_TABLET | Freq: Two times a day (BID) | ORAL | Status: DC
  Administered 2020-01-10 – 2020-01-13 (×8): 1 via ORAL
  Filled 2020-01-09 (×10): qty 1

## 2020-01-09 MED ORDER — SERTRALINE HCL 50 MG PO TABS
100.0000 mg | ORAL_TABLET | Freq: Every day | ORAL | Status: DC
  Administered 2020-01-09 – 2020-01-13 (×5): 100 mg via ORAL
  Filled 2020-01-09 (×5): qty 2

## 2020-01-09 MED ORDER — HEPARIN SODIUM (PORCINE) 5000 UNIT/ML IJ SOLN
5000.0000 [IU] | Freq: Three times a day (TID) | INTRAMUSCULAR | Status: DC
  Administered 2020-01-10: 5000 [IU] via SUBCUTANEOUS
  Filled 2020-01-09: qty 1

## 2020-01-09 MED ORDER — EPOETIN ALFA 10000 UNIT/ML IJ SOLN
10000.0000 [IU] | INTRAMUSCULAR | Status: DC
  Administered 2020-01-09: 10000 [IU] via SUBCUTANEOUS
  Filled 2020-01-09: qty 1

## 2020-01-09 MED ORDER — SODIUM CHLORIDE 0.9 % IV SOLN: 100.0000 mL | INTRAVENOUS | Status: DC | PRN

## 2020-01-09 MED ORDER — ACETAMINOPHEN 325 MG PO TABS
650.0000 mg | ORAL_TABLET | ORAL | Status: DC | PRN
  Administered 2020-01-12: 650 mg via ORAL
  Filled 2020-01-09: qty 2

## 2020-01-09 MED ORDER — HEPARIN SODIUM (PORCINE) 1000 UNIT/ML DIALYSIS
20.0000 [IU]/kg | INTRAMUSCULAR | Status: DC | PRN
  Filled 2020-01-09: qty 2

## 2020-01-09 MED ORDER — ONDANSETRON HCL 4 MG/2ML IJ SOLN: 4.0000 mg | Freq: Four times a day (QID) | INTRAMUSCULAR | Status: DC | PRN

## 2020-01-09 MED ORDER — FOLIC ACID 1 MG PO TABS
1.0000 mg | ORAL_TABLET | Freq: Every day | ORAL | Status: DC
  Administered 2020-01-09 – 2020-01-13 (×5): 1 mg via ORAL
  Filled 2020-01-09 (×5): qty 1

## 2020-01-09 MED ORDER — LIDOCAINE-PRILOCAINE 2.5-2.5 % EX CREA
1.0000 "application " | TOPICAL_CREAM | CUTANEOUS | Status: DC | PRN
  Filled 2020-01-09: qty 5

## 2020-01-09 MED ORDER — SODIUM CHLORIDE 0.9% FLUSH
3.0000 mL | Freq: Two times a day (BID) | INTRAVENOUS | Status: DC
  Administered 2020-01-10 – 2020-01-13 (×8): 3 mL via INTRAVENOUS

## 2020-01-09 MED ORDER — ALTEPLASE 2 MG IJ SOLR
2.0000 mg | Freq: Once | INTRAMUSCULAR | Status: DC | PRN
  Filled 2020-01-09: qty 2

## 2020-01-09 MED ORDER — NEPRO/CARBSTEADY PO LIQD: 237.0000 mL | Freq: Three times a day (TID) | ORAL | Status: DC

## 2020-01-09 MED ORDER — SODIUM CHLORIDE 0.9% FLUSH: 3.0000 mL | INTRAVENOUS | Status: DC | PRN

## 2020-01-09 MED ORDER — SODIUM CHLORIDE 0.9 % IV SOLN: 250.0000 mL | INTRAVENOUS | Status: DC | PRN

## 2020-01-09 MED ORDER — LIDOCAINE HCL (PF) 1 % IJ SOLN
5.0000 mL | INTRAMUSCULAR | Status: DC | PRN
  Filled 2020-01-09: qty 5

## 2020-01-09 MED ORDER — ASPIRIN EC 81 MG PO TBEC
81.0000 mg | DELAYED_RELEASE_TABLET | Freq: Every day | ORAL | Status: DC
  Administered 2020-01-09 – 2020-01-13 (×5): 81 mg via ORAL
  Filled 2020-01-09 (×5): qty 1

## 2020-01-09 NOTE — H&P (Addendum)
[place] [facility] [place] NAME: [name]    MR#:  [id number]  DATE OF BIRTH:  [birth year]  DATE OF ADMISSION:  01/09/2020  PRIMARY CARE PHYSICIAN: Reginald Harrier, MD   REQUESTING/REFERRING PHYSICIAN:   Patient coming from : dialysis unit  lives with wife at home uses walker to ambulate CHIEF COMPLAINT:   Shortness of breath  Patient is a bit irritable does not want to  give any history in detail. History obtained from ER physician HISTORY OF PRESENT ILLNESS:  [name]  is a 59 y.o. male with a known history of end-stage renal disease on hemodialysis, diabetes, history of alcohol abuse, Jerrye Bushy, hyperlipidemia, CVA comes to the emergency room from hemodialysis after he was found to be short winded. Patient recently was discharged on 23rd August with 4 L of chronic nasal cannula oxygen. He was admitted at that time for G.I. workup and anemia. Received blood transfusion. His endoscopy was negative. Patient had colonoscopy in October 2020 essentially nothing major found other than a few polyps. ED course: patient short of breath with talking. A bit irritable. Currently on 6 L nasal cannula sats maintaining 92 to 94% chest x-ray shows diffuse pulmonary edema hemoglobin 7.4 (8.5 on August 23)-- denies melena. Recent EGD normal colonoscopy was done October 2020 nothing acute  Patient is being admitted for acute flash pulmonary edema/acute on chronic hypoxic respiratory failure/systolic congestive heart failure in the setting of end-stage renal disease.  ER physician has discussed with Dr. Theador Hawthorne-- patient to get dialysis with ultrafiltration today. PAST MEDICAL HISTORY:   Past Medical History:  Diagnosis Date  . Chronic kidney disease   . Diabetes mellitus without complication ([place])   . ETOH abuse   . GERD (gastroesophageal reflux disease)   . Hyperlipidemia   . Hypertension   . Stroke [facility])     PAST SURGICAL HISTOIRY:   Past  Surgical History:  Procedure Laterality Date  . AMPUTATION TOE Left 12/11/2019   Procedure: AMPUTATION LEFT 2nd TOE;  Surgeon: Sharlotte Alamo, DPM;  Location: [facility] ORS;  Service: Podiatry;  Laterality: Left;  . AV FISTULA PLACEMENT Right 11/25/2019   Procedure: INSERTION OF ARTERIOVENOUS (AV) GORE-TEX GRAFT ARM;  Surgeon: Katha Cabal, MD;  Location: [facility] ORS;  Service: Vascular;  Laterality: Right;  . COLONOSCOPY WITH PROPOFOL N/A 03/06/2019   Procedure: COLONOSCOPY WITH PROPOFOL;  Surgeon: Lucilla Lame, MD;  Location: [facility] ENDOSCOPY;  Service: Endoscopy;  Laterality: N/A;  . DIALYSIS/PERMA CATHETER INSERTION N/A 04/10/2019   Procedure: DIALYSIS/PERMA CATHETER INSERTION;  Surgeon: Serafina Mitchell, MD;  Location: [place] CV LAB;  Service: Cardiovascular;  Laterality: N/A;  . ESOPHAGOGASTRODUODENOSCOPY (EGD) WITH PROPOFOL N/A 07/01/2016   Procedure: ESOPHAGOGASTRODUODENOSCOPY (EGD) WITH PROPOFOL;  Surgeon: San Jetty, MD;  Location: [facility] ENDOSCOPY;  Service: General;  Laterality: N/A;  . ESOPHAGOGASTRODUODENOSCOPY (EGD) WITH PROPOFOL N/A 12/25/2019   Procedure: ESOPHAGOGASTRODUODENOSCOPY (EGD) WITH PROPOFOL;  Surgeon: Lin Landsman, MD;  Location: [facility] ENDOSCOPY;  Service: Gastroenterology;  Laterality: N/A;  . LOWER EXTREMITY ANGIOGRAPHY Left 12/10/2019   Procedure: Lower Extremity Angiography;  Surgeon: Algernon Huxley, MD;  Location: [place] CV LAB;  Service: Cardiovascular;  Laterality: Left;    SOCIAL HISTORY:   Social History   Tobacco Use  . Smoking status: Current Every Day Smoker    Packs/day: 0.25    Years: 5.00    Pack years: 1.25    Types: Cigars  . Smokeless tobacco: Never Used  Substance Use Topics  .  Alcohol use: Yes    Alcohol/week: 16.0 standard drinks    Types: 14 Cans of beer, 2 Shots of liquor per week    FAMILY HISTORY:   Family History  Problem Relation Age of Onset  . CAD Brother   . Dementia Mother   . Renal Disease Father     DRUG  ALLERGIES:   Allergies  Allergen Reactions  . Ferrous Gluconate Nausea And Vomiting  . Other     REVIEW OF SYSTEMS:  Review of Systems  Constitutional: Negative for chills, fever and weight loss.  HENT: Negative for ear discharge, ear pain and nosebleeds.   Eyes: Negative for blurred vision, pain and discharge.  Respiratory: Positive for shortness of breath. Negative for sputum production, wheezing and stridor.   Cardiovascular: Negative for chest pain, palpitations, orthopnea and PND.  Gastrointestinal: Negative for abdominal pain, diarrhea, nausea and vomiting.  Genitourinary: Negative for frequency and urgency.  Musculoskeletal: Negative for back pain and joint pain.  Neurological: Positive for weakness. Negative for sensory change, speech change and focal weakness.  Psychiatric/Behavioral: Negative for depression and hallucinations. The patient is not nervous/anxious.      MEDICATIONS AT HOME:   Prior to Admission medications   Medication Sig Start Date End Date Taking? Authorizing Provider  aspirin EC 81 MG EC tablet Take 1 tablet (81 mg total) by mouth daily. 06/10/19   Jennye Boroughs, MD  carvedilol (COREG) 6.25 MG tablet Take 6.25 mg by mouth 2 (two) times daily with a meal.  10/28/19   [provider]  ENTRESTO 24-26 MG Take 1 tablet by mouth 2 (two) times daily. 07/29/19   [provider]  epoetin alfa (EPOGEN) 10000 UNIT/ML injection Inject 0.4 mLs (4,000 Units total) into the vein Every Tuesday,Thursday,and Saturday with dialysis. 01/05/20   Enzo Bi, MD  folic acid (FOLVITE) 1 MG tablet Take 1 mg by mouth daily. 03/12/19   [provider]  hydrALAZINE (APRESOLINE) 25 MG tablet Take 1 tablet (25 mg total) by mouth every 8 (eight) hours. Patient taking differently: Take 25 mg by mouth in the morning and at bedtime.  07/08/19   Nicole Kindred A, DO  insulin glargine (LANTUS) 100 UNIT/ML injection Inject 0.04 mLs (4 Units total) into the skin daily.  01/04/20   Enzo Bi, MD  insulin lispro (HUMALOG KWIKPEN) 100 UNIT/ML KwikPen Inject 0.03 mLs (3 Units total) into the skin 3 (three) times daily with meals. 01/04/20   Enzo Bi, MD  iron polysaccharides (NIFEREX) 150 MG capsule Take 1 capsule (150 mg total) by mouth daily. 01/05/20 04/04/20  Enzo Bi, MD  Melatonin 3 MG TABS Take 1 tablet by mouth at bedtime. 05/23/19   [provider]  multivitamin (RENA-VIT) TABS tablet Take 1 tablet by mouth daily.    [provider]  nicotine (NICODERM CQ - DOSED IN MG/24 HR) 7 mg/24hr patch Place 1 patch (7 mg total) onto the skin daily. 01/05/20 04/04/20  Enzo Bi, MD  Nutritional Supplements (FEEDING SUPPLEMENT, NEPRO CARB STEADY,) LIQD Take 237 mLs by mouth 3 (three) times daily between meals. 07/08/19   Ezekiel Slocumb, DO  sertraline (ZOLOFT) 100 MG tablet Take 100 mg by mouth daily.  06/17/19   [provider]  thiamine (VITAMIN B-1) 100 MG tablet Take 100 mg by mouth daily.    [provider]      VITAL SIGNS:  Blood pressure (!) 151/89, pulse 95, temperature 98.2 F (36.8 C), temperature source Oral, resp. rate (!) 27,  height 5\' 11"  (1.803 m), weight 61.2 kg, SpO2 92 %.  PHYSICAL EXAMINATION:  GENERAL:  59 y.o.-year-old patient lying in the bed with mild acute respiratory distress. Thin cachectic EYES: Pupils equal, round, reactive to light and accommodation. No scleral icterus.  HEENT: Head atraumatic, normocephalic. Oropharynx and nasopharynx clear.  LUNGS: Normal breath sounds bilaterally, no wheezing, bilateral rales,rhonchi or crepitation. Intermittent accessory muscles of respiration.  CARDIOVASCULAR: S1, S2 normal. No murmurs, rubs, or gallops. Tachycardia ABDOMEN: Soft, nontender, nondistended. Bowel sounds present. No organomegaly or mass.  EXTREMITIES: + pedal edema, no cyanosis, or clubbing.  NEUROLOGIC: Cranial nerves II through XII are intact. Muscle strength 5/5 in all extremities. Sensation intact.  Gait not checked. Generalized weakness PSYCHIATRIC: The patient is alert and oriented x 2.  SKIN: No obvious rash, lesion, or ulcer.   LABORATORY PANEL:   CBC Recent Labs  Lab 01/09/20 0840  WBC 6.1  HGB 7.4*  HCT 23.1*  PLT 147*   ------------------------------------------------------------------------------------------------------------------  Chemistries  Recent Labs  Lab 01/04/20 0923 01/04/20 0923 01/09/20 0840  NA 139   < > 131*  K 4.2   < > 3.6  CL 101   < > 96*  CO2 26   < > 26  GLUCOSE 119*   < > 465*  BUN 26*   < > 39*  CREATININE 4.17*   < > 3.29*  CALCIUM 8.1*   < > 8.2*  MG 2.1  --   --    < > = values in this interval not displayed.   ------------------------------------------------------------------------------------------------------------------  Cardiac Enzymes No results for input(s): TROPONINI in the last 168 hours. ------------------------------------------------------------------------------------------------------------------  RADIOLOGY:  DG Chest Portable 1 View  Result Date: 01/09/2020 CLINICAL DATA:  Shortness of breath.  Evaluate for edema. EXAM: PORTABLE CHEST 1 VIEW COMPARISON:  January 02, 2020 FINDINGS: A dialysis catheter is stable. No pneumothorax. Diffuse bilateral pulmonary opacities are similar since December 13, 2019. A small left effusion with underlying atelectasis is suspected. The cardiomediastinal silhouette is stable. No other acute abnormalities. IMPRESSION: 1. Mild cardiomegaly with diffuse bilateral pulmonary opacities most suggestive of pulmonary edema. Recommend clinical correlation and follow-up to resolution. 2. Stable dialysis catheter. 3. No other acute abnormalities. Electronically Signed   By: Dorise Bullion III M.D   On: 01/09/2020 09:13    EKG:    IMPRESSION AND PLAN:   [name]  is a 59 y.o. male with a known history of end-stage renal disease on hemodialysis, diabetes, history of alcohol abuse, Jerrye Bushy,  hyperlipidemia, CVA comes to the emergency room from hemodialysis after he was found to be short winded.  Acute on chronic hypoxic respiratory failure secondary to diffuse pulmonary edema Congestive heart failure acute systolic with severe MR noted on echo admit to progressive care unit -continue oxygen 6 L nasal cannula wean as tolerated. Patient chronically on 4 L nasal cannula oxygen -patient to get dialysis this morning with ultrafiltration given pulmonary edema. Nephrology informed. -Continue home meds aspirin, carvedilol, enteresto, hydralazine  Acute congestive heart failure systolic -treatment as above will-continue cardiac meds -BNP pending -echo done July 2021 shows EF of 45 to 50% with severe pulmonary hypertension, severe MR, moderate TR, mild AR  End-stage renal disease on hemodialysis nephrology aware  anemia of chronic disease -recent endoscopy was within normal limit -patient had colonoscopy in October 2020-- with removal of multiple polyps. Internal hemorrhoids. -continue iron supplements -hemoglobin 7.4-- transfuse as needed  type II diabetes, hyperglycemia with renal manifestation --sliding scale insulin -continue  Lantus and Humalog adjust dose according to sugars  DVT prophylaxis -subcu heparin  Protein calorie malnutrition-moderate continue glucerna  PT OT and TOC for discharge planning  Family Communication : none Consults : nephrology Code Status : full DVT prophylaxis : heparin admission status inpatient  TOTAL TIME TAKING CARE OF THIS PATIENT: **55* minutes.    Fritzi Mandes M.D  [facility]     CC: Primary care physician; Reginald Harrier, MD

## 2020-01-09 NOTE — ED Notes (Signed)
Pt given coffee as requested with more blankets

## 2020-01-09 NOTE — Progress Notes (Signed)
PT Cancellation Note  Patient Details Name: [name] MRN: [id number] DOB: [birth year]   Cancelled Treatment:    Reason Eval/Treat Not Completed: Other (comment). Pt off the floor at this time, also pending further medical workup. PT to re-attempt as able.  Lieutenant Diego PT, DPT 10:51 AM,01/09/20

## 2020-01-09 NOTE — ED Notes (Signed)
Pt upset about dinner trays running late, called dietary earlier to verify pt had meal tray coming. Pt also upset about not having a room on floor. Apologized to pt and told him hospital was pretty full but we would do our best to get him a room.

## 2020-01-09 NOTE — ED Triage Notes (Signed)
Pt arrives from dialysis via [facility]. Pt received half treatment of dialysis before becoming SOB. Pt wears O2 at baseline 2L Hand. Pt treated at hospital last week for same complaint. Pt 94% on 4L during transport. Lung sound clear per EMS.

## 2020-01-09 NOTE — ED Notes (Signed)
Pt assist to toilet (mostly gas with scant diarrhea) pt back to hall bed and updated on wait for admission bed

## 2020-01-09 NOTE — ED Provider Notes (Signed)
[facility] Emergency Department Provider Note    First MD Initiated Contact with Patient 01/09/20 [phone]     (approximate)  I have reviewed the triage vital signs and the nursing notes.   HISTORY  Chief Complaint Shortness of Breath    HPI [name] is a 59 y.o. male below listed past medical history presents to the ER for evaluation of reported low oxygen sats.  Patient states that he feels "fine".  Denies any shortness of breath or discomfort.  States he did get half a session of dialysis today.  He denies any complaints and is asking when he can be taken home.    Past Medical History:  Diagnosis Date  . Chronic kidney disease   . Diabetes mellitus without complication ([place])   . ETOH abuse   . GERD (gastroesophageal reflux disease)   . Hyperlipidemia   . Hypertension   . Stroke [facility])    Family History  Problem Relation Age of Onset  . CAD Brother   . Dementia Mother   . Renal Disease Father    Past Surgical History:  Procedure Laterality Date  . AMPUTATION TOE Left 12/11/2019   Procedure: AMPUTATION LEFT 2nd TOE;  Surgeon: Sharlotte Alamo, DPM;  Location: [facility] ORS;  Service: Podiatry;  Laterality: Left;  . AV FISTULA PLACEMENT Right 11/25/2019   Procedure: INSERTION OF ARTERIOVENOUS (AV) GORE-TEX GRAFT ARM;  Surgeon: Katha Cabal, MD;  Location: [facility] ORS;  Service: Vascular;  Laterality: Right;  . COLONOSCOPY WITH PROPOFOL N/A 03/06/2019   Procedure: COLONOSCOPY WITH PROPOFOL;  Surgeon: Lucilla Lame, MD;  Location: [facility] ENDOSCOPY;  Service: Endoscopy;  Laterality: N/A;  . DIALYSIS/PERMA CATHETER INSERTION N/A 04/10/2019   Procedure: DIALYSIS/PERMA CATHETER INSERTION;  Surgeon: Serafina Mitchell, MD;  Location: [place] CV LAB;  Service: Cardiovascular;  Laterality: N/A;  . ESOPHAGOGASTRODUODENOSCOPY (EGD) WITH PROPOFOL N/A 07/01/2016   Procedure: ESOPHAGOGASTRODUODENOSCOPY (EGD) WITH PROPOFOL;  Surgeon: San Jetty, MD;  Location: [facility]  ENDOSCOPY;  Service: General;  Laterality: N/A;  . ESOPHAGOGASTRODUODENOSCOPY (EGD) WITH PROPOFOL N/A 12/25/2019   Procedure: ESOPHAGOGASTRODUODENOSCOPY (EGD) WITH PROPOFOL;  Surgeon: Lin Landsman, MD;  Location: [facility] ENDOSCOPY;  Service: Gastroenterology;  Laterality: N/A;  . LOWER EXTREMITY ANGIOGRAPHY Left 12/10/2019   Procedure: Lower Extremity Angiography;  Surgeon: Algernon Huxley, MD;  Location: [place] CV LAB;  Service: Cardiovascular;  Laterality: Left;   Patient Active Problem List   Diagnosis Date Noted  . Acute on chronic respiratory failure with hypoxemia ([place]) 12/29/2019  . Aspiration pneumonia of both lower lobes due to gastric secretions ([place]) 12/29/2019  . Diabetic foot infection ([place]) 12/07/2019  . Acute pulmonary edema ([place]) 12/07/2019  . Paronychia of second toe of left foot 11/09/2019  . Chronic osteomyelitis of toe of left foot ([place]) 11/09/2019  . Uncontrolled type 2 diabetes mellitus with hyperosmolar nonketotic hyperglycemia ([place]) 09/16/2019  . Hypoglycemia associated with diabetes ([place]) 08/24/2019  . Acute respiratory failure with hypoxia ([place])   . Acute metabolic encephalopathy 39/76/7341  . Hypertensive emergency 07/06/2019  . Acute encephalopathy 07/06/2019  . Seizures due to metabolic disorder ([place]) 93/79/0240  . Hyperosmolar hyperglycemic state (HHS) ([place]) 06/05/2019  . Symptomatic anemia 06/04/2019  . Frequent falls 06/04/2019  . History of CVA (cerebrovascular accident) 06/04/2019  . HCAP (healthcare-associated pneumonia) 06/04/2019  . Acute blood loss anemia 06/04/2019  . Hyperglycemia 05/28/2019  . Fall at home, initial encounter 05/28/2019  . Rib fracture 05/28/2019  . Depression 05/28/2019  . Hypokalemia 05/28/2019  .  Weakness 05/27/2019  . ESRD (end stage renal disease) ([place])   . Acute renal failure (ARF) ([place]) 04/09/2019  . Polyp of ascending colon   . Diarrhea   . AKI (acute kidney injury) ([place]) 03/04/2019  . Acute kidney injury  ([place]) 07/26/2018  . ARF (acute renal failure) ([place]) 07/25/2018  . Bilateral leg numbness 03/19/2018  . Numbness and tingling of both feet 03/19/2018  . CVA (cerebral vascular accident) ([place]) 03/17/2018  . Moderate episode of recurrent major depressive disorder ([place]) 03/11/2018  . UTI (urinary tract infection) 02/27/2018  . Hypoglycemia 01/15/2018  . Type 2 diabetes mellitus without complication, with long-term current use of insulin ([place]) 08/27/2017  . Protein-calorie malnutrition, severe 08/19/2017  . Pancreatitis, acute 08/16/2017  . DKA (diabetic ketoacidoses) ([place]) 08/16/2017  . HTN (hypertension) 08/16/2017  . HLD (hyperlipidemia) 08/16/2017  . Carotid stenosis 08/02/2016  . GERD (gastroesophageal reflux disease) 08/02/2016  . History of esophagogastroduodenoscopy (EGD) 07/01/2016  . History of recent blood transfusion 07/01/2016  . Acute renal failure superimposed on stage 4 chronic kidney disease ([place]) 07/01/2016  . Hyponatremia 07/01/2016  . Uncontrolled diabetes mellitus ([place]) 07/01/2016  . Alcohol abuse 07/01/2016  . Monilial esophagitis ([place]) 07/01/2016  . Tobacco abuse 07/01/2016  . Confusion 06/29/2016  . Iron deficiency anemia 06/29/2016  . Coagulopathy ([place]) 06/29/2016      Prior to Admission medications   Medication Sig Start Date End Date Taking? Authorizing Provider  aspirin EC 81 MG EC tablet Take 1 tablet (81 mg total) by mouth daily. 06/10/19   Jennye Boroughs, MD  carvedilol (COREG) 6.25 MG tablet Take 6.25 mg by mouth 2 (two) times daily with a meal.  10/28/19   [provider]  ENTRESTO 24-26 MG Take 1 tablet by mouth 2 (two) times daily. 07/29/19   [provider]  epoetin alfa (EPOGEN) 10000 UNIT/ML injection Inject 0.4 mLs (4,000 Units total) into the vein Every Tuesday,Thursday,and Saturday with dialysis. 01/05/20   Enzo Bi, MD  folic acid (FOLVITE) 1 MG tablet Take 1 mg by mouth daily. 03/12/19   [provider]  hydrALAZINE  (APRESOLINE) 25 MG tablet Take 1 tablet (25 mg total) by mouth every 8 (eight) hours. Patient taking differently: Take 25 mg by mouth in the morning and at bedtime.  07/08/19   Nicole Kindred A, DO  insulin glargine (LANTUS) 100 UNIT/ML injection Inject 0.04 mLs (4 Units total) into the skin daily. 01/04/20   Enzo Bi, MD  insulin lispro (HUMALOG KWIKPEN) 100 UNIT/ML KwikPen Inject 0.03 mLs (3 Units total) into the skin 3 (three) times daily with meals. 01/04/20   Enzo Bi, MD  iron polysaccharides (NIFEREX) 150 MG capsule Take 1 capsule (150 mg total) by mouth daily. 01/05/20 04/04/20  Enzo Bi, MD  Melatonin 3 MG TABS Take 1 tablet by mouth at bedtime. 05/23/19   [provider]  multivitamin (RENA-VIT) TABS tablet Take 1 tablet by mouth daily.    [provider]  nicotine (NICODERM CQ - DOSED IN MG/24 HR) 7 mg/24hr patch Place 1 patch (7 mg total) onto the skin daily. 01/05/20 04/04/20  Enzo Bi, MD  Nutritional Supplements (FEEDING SUPPLEMENT, NEPRO CARB STEADY,) LIQD Take 237 mLs by mouth 3 (three) times daily between meals. 07/08/19   Ezekiel Slocumb, DO  sertraline (ZOLOFT) 100 MG tablet Take 100 mg by mouth daily.  06/17/19   [provider]  thiamine (VITAMIN B-1) 100 MG tablet Take 100 mg by mouth daily.    [provider]    Allergies Ferrous gluconate and Other    Social History Social History   Tobacco Use  . Smoking status: Current Every Day Smoker    Packs/day: 0.25    Years: 5.00    Pack years: 1.25    Types: Cigars  . Smokeless tobacco: Never Used  Vaping Use  . Vaping Use: Never used  Substance Use Topics  . Alcohol use: Yes    Alcohol/week: 16.0 standard drinks    Types: 14 Cans of beer, 2 Shots of liquor per week  . Drug use: No    Review of Systems Patient denies headaches, rhinorrhea, blurry vision, numbness, shortness of breath, chest pain, edema, cough, abdominal pain, nausea, vomiting, diarrhea, dysuria, fevers, rashes or  hallucinations unless otherwise stated above in HPI. ____________________________________________   PHYSICAL EXAM:  VITAL SIGNS: Vitals:   01/09/20 0804 01/09/20 0830  BP: (!) 150/81 (!) 151/89  Pulse: 96 95  Resp: (!) 22 (!) 27  Temp: 98.2 F (36.8 C)   SpO2: 100% 92%    Constitutional: Alert , chronically ill appearing Eyes: Conjunctivae are normal.  Head: Atraumatic. Nose: No congestion/rhinnorhea. Mouth/Throat: Mucous membranes are moist.   Neck: No stridor. Painless ROM.  Cardiovascular: Normal rate, regular rhythm. Grossly normal heart sounds.  Good peripheral circulation. Respiratory: mild tachypnea with diminished bibasilar breathsounds Gastrointestinal: Soft and nontender. No distention. No abdominal bruits. No CVA tenderness. Genitourinary:  Musculoskeletal: No lower extremity tenderness nor edema.  No joint effusions. Neurologic:  Normal speech and language. No gross focal neurologic deficits are appreciated. No facial droop Skin:  Skin is warm, dry and intact. No rash noted. Psychiatric: Mood and affect are normal. Speech and behavior are normal.  ____________________________________________   LABS (all labs ordered are listed, but only abnormal results are displayed)  Results for orders placed or performed during the hospital encounter of 01/09/20 (from the past 24 hour(s))  CBC with Differential/Platelet     Status: Abnormal   Collection Time: 01/09/20  8:40 AM  Result Value Ref Range   WBC 6.1 4.0 - 10.5 K/uL   RBC 2.40 (L) 4.22 - 5.81 MIL/uL   Hemoglobin 7.4 (L) 13.0 - 17.0 g/dL   HCT 23.1 (L) 39 - 52 %   MCV 96.3 80.0 - 100.0 fL   MCH 30.8 26.0 - 34.0 pg   MCHC 32.0 30.0 - 36.0 g/dL   RDW 14.6 11.5 - 15.5 %   Platelets 147 (L) 150 - 400 K/uL   nRBC 0.0 0.0 - 0.2 %   Neutrophils Relative % 82 %   Neutro Abs 5.0 1.7 - 7.7 K/uL   Lymphocytes Relative 11 %   Lymphs Abs 0.7 0.7 - 4.0 K/uL   Monocytes Relative 6 %   Monocytes Absolute 0.4 0 - 1 K/uL    Eosinophils Relative 1 %   Eosinophils Absolute 0.1 0 - 0 K/uL   Basophils Relative 0 %   Basophils Absolute 0.0 0 - 0 K/uL   Immature Granulocytes 0 %   Abs Immature Granulocytes 0.01 0.00 - 0.07 K/uL  Basic metabolic panel     Status: Abnormal   Collection Time: 01/09/20  8:40 AM  Result Value Ref Range   Sodium 131 (L) 135 - 145 mmol/L   Potassium 3.6 3.5 - 5.1 mmol/L   Chloride 96 (L) 98 - 111 mmol/L   CO2 26 22 - 32 mmol/L   Glucose, Bld 465 (H) 70 - 99 mg/dL   BUN 39 (H) 6 -  20 mg/dL   Creatinine, Ser 3.29 (H) 0.61 - 1.24 mg/dL   Calcium 8.2 (L) 8.9 - 10.3 mg/dL   GFR calc non Af Amer 19 (L) >60 mL/min   GFR calc Af Amer 22 (L) >60 mL/min   Anion gap 9 5 - 15   ____________________________________________  EKG My review and personal interpretation at Time: 8:04   Indication: sob  Rate: 90  Rhythm: sinus Axis: normal Other: nonspecific st abn, no stemi ____________________________________________  RADIOLOGY  I personally reviewed all radiographic images ordered to evaluate for the above acute complaints and reviewed radiology reports and findings.  These findings were personally discussed with the patient.  Please see medical record for radiology report.  ____________________________________________   PROCEDURES  Procedure(s) performed:  .Critical Care Performed by: Merlyn Lot, MD Authorized by: Merlyn Lot, MD   Critical care provider statement:    Critical care time (minutes):  35   Critical care time was exclusive of:  Separately billable procedures and treating other patients   Critical care was necessary to treat or prevent imminent or life-threatening deterioration of the following conditions:  Respiratory failure   Critical care was time spent personally by me on the following activities:  Development of treatment plan with patient or surrogate, discussions with consultants, evaluation of patient's response to treatment, examination of  patient, obtaining history from patient or surrogate, ordering and performing treatments and interventions, ordering and review of laboratory studies, ordering and review of radiographic studies, pulse oximetry, re-evaluation of patient's condition and review of old charts      Critical Care performed: yes ____________________________________________   INITIAL IMPRESSION / [place] / ED COURSE  Pertinent labs & imaging results that were available during my care of the patient were reviewed by me and considered in my medical decision making (see chart for details).   DDX: Asthma, copd, CHF, pna, ptx, malignancy, Pe, anemia   HASSEL UPHOFF is a 59 y.o. who presents to the ED with presentation as described above.  Patient is afebrile but noted to be mild respiratory distress requiring additional supplemental oxygen with evidence of acute on chronic respiratory failure.  Suspect worsening edema.  Will check for Covid.  Will evaluate for infectious process.  He is currently protecting his airway.  No history of COPD on appreciating significant wheezing on exam.  The patient will be placed on continuous pulse oximetry and telemetry for monitoring.  Laboratory evaluation will be sent to evaluate for the above complaints.     Clinical Course as of Jan 09 923  Sat Jan 09, 2020  0923 Patient does have evidence of worsening CHF on chest x-ray any is requiring additional oxygen.  Discussed case in consultation with Dr. Theador Hawthorne of nephrology agrees to arrange dialysis.  Will consult hospitalist for admission.   [PR]    Clinical Course User Index [PR] Merlyn Lot, MD    The patient was evaluated in Emergency Department today for the symptoms described in the history of present illness. He/she was evaluated in the context of the global COVID-19 pandemic, which necessitated consideration that the patient might be at risk for infection with the SARS-CoV-2 virus that causes COVID-19.  Institutional protocols and algorithms that pertain to the evaluation of patients at risk for COVID-19 are in a state of rapid change based on information released by regulatory bodies including the CDC and federal and state organizations. These policies and algorithms were followed during the patient's care in the ED.  As  part of my medical decision making, I reviewed the following data within the [facility] notes reviewed and incorporated, Labs reviewed, notes from prior ED visits and [place] Controlled Substance Database   ____________________________________________   FINAL CLINICAL IMPRESSION(S) / ED DIAGNOSES  Final diagnoses:  Acute on chronic congestive heart failure, unspecified heart failure type ([place])  Acute on chronic respiratory failure with hypoxia ([place])  ESRD on dialysis ([place])      NEW MEDICATIONS STARTED DURING THIS VISIT:  New Prescriptions   No medications on file     Note:  This document was prepared using Dragon voice recognition software and may include unintentional dictation errors.    Merlyn Lot, MD 01/09/20 [phone]

## 2020-01-09 NOTE — ED Notes (Signed)
Pt assisted to toilet in wheelchair. Pt tolerated transition to wheelchair fine. Pt provided w/ dinner tray at bedside.

## 2020-01-09 NOTE — Progress Notes (Signed)
OT Cancellation Note  Patient Details Name: [name] MRN: [id number] DOB: 04-Apr-1961   Cancelled Treatment:    Reason Eval/Treat Not Completed: Patient at procedure or test/ unavailable. Pt off the floor at this time, will follow up at later date/time as able to initiate services.     Dessie Coma, M.S. OTR/L  01/09/20, 10:53 AM  ascom [phone]

## 2020-01-09 NOTE — Progress Notes (Signed)
ARWIN BISCEGLIA  MRN: [id number]  DOB/AGE: 08/12/1960 59 y.o.  Primary Care Physician:Hande, Cherlyn Labella, MD  Admit date: 01/09/2020  Chief Complaint:  Chief Complaint  Patient presents with  . Shortness of Breath    S-Pt presented on  01/09/2020 with  Chief Complaint  Patient presents with  . Shortness of Breath  . Patient is a 59 year old African-American male with a past medical history of ESRD, diabetes mellitus, history of alcohol abuse,Gerd, hyperlipidemia, CVA who was sent to the ER with chief complaint of shortness of breath .  History of present illness dates back to August 12 to  August 23 was admitted for acute on chronic hypoxemic respiratory failure, pulmonary edema and bilateral effusion, severe iron deficiency anemia.  Patient did undergo GI work-up but no clear source was identified.  Patient was later discharged with 4 L of chronic nasal cannula oxygen.  Patient was found to be short of breath this morning and was sent to the ER Upon evaluation in the ER patient was found to be hypoxic, patient is chest x-ray done which showed diffuse pulmonary edema.  Patient hemoglobin was 7.4  Patient was  admitted for acute flash pulmonary edema/acute on chronic hypoxic respiratory failure/systolic congestive heart failure in the setting of end-stage renal disease and nephrology was consulted.  Patient continues to complain of shortness of breath. Patient was later started on dialysis. Patient was seen on dialysis. Patient tolerated treatment well Patient main complaint during dialysis "I could get something to eat?"   Medications  Patient medication reviewed      GYF:VCBSW from the symptoms mentioned above,there are no other symptoms referable to all systems reviewed.  Physical Exam: Vital signs in last 24 hours: Temp:  [98.2 F (36.8 C)] 98.2 F (36.8 C) (08/28 0804) Pulse Rate:  [95-98] 98 (08/28 1626) Resp:  [18-27] 18 (08/28 1626) BP: (143-151)/(80-89) 143/80  (08/28 1626) SpO2:  [91 %-100 %] 96 % (08/28 1626) Weight:  [61.2 kg] 61.2 kg (08/28 0805) Weight change:     Intake/Output from previous day: No intake/output data recorded. No intake/output data recorded.   Physical Exam: General- pt is awake,alert, oriented to time place and person Resp- No acute REsp distress, decreased breath sound at bases, crackles present CVS- S1S2 regular in rate and rhythm GIT- BS+, soft, NT, ND EXT- NO LE Edema, Cyanosis Access patient has permacath in situ  Lab Results: CBC Recent Labs    01/09/20 0840  WBC 6.1  HGB 7.4*  HCT 23.1*  PLT 147*    BMET Recent Labs    01/09/20 0840  NA 131*  K 3.6  CL 96*  CO2 26  GLUCOSE 465*  BUN 39*  CREATININE 3.29*  CALCIUM 8.2*    MICRO Recent Results (from the past 240 hour(s))  SARS Coronavirus 2 by RT PCR (hospital order, performed in [facility] hospital lab) Nasopharyngeal Nasopharyngeal Swab     Status: None   Collection Time: 01/09/20  8:40 AM   Specimen: Nasopharyngeal Swab  Result Value Ref Range Status   SARS Coronavirus 2 NEGATIVE NEGATIVE Final    Comment: (NOTE) SARS-CoV-2 target nucleic acids are NOT DETECTED.  The SARS-CoV-2 RNA is generally detectable in upper and lower respiratory specimens during the acute phase of infection. The lowest concentration of SARS-CoV-2 viral copies this assay can detect is 250 copies / mL. A negative result does not preclude SARS-CoV-2 infection and should not be used as the sole basis for treatment or other patient management decisions.  A negative result may occur with improper specimen collection / handling, submission of specimen other than nasopharyngeal swab, presence of viral mutation(s) within the areas targeted by this assay, and inadequate number of viral copies (<250 copies / mL). A negative result must be combined with clinical observations, patient history, and epidemiological information.  Fact Sheet for Patients:    StrictlyIdeas.no  Fact Sheet for Healthcare Providers: BankingDealers.co.za  This test is not yet approved or  cleared by the [place] FDA and has been authorized for detection and/or diagnosis of SARS-CoV-2 by FDA under an Emergency Use Authorization (EUA).  This EUA will remain in effect (meaning this test can be used) for the duration of the COVID-19 declaration under Section 564(b)(1) of the Act, 21 U.S.C. section 360bbb-3(b)(1), unless the authorization is terminated or revoked sooner.  Performed at [facility], [address] [place], [place] [address]       Lab Results  Component Value Date   CALCIUM 8.2 (L) 01/09/2020   CAION 1.00 (L) 12/25/2019   PHOS 6.6 (H) 12/26/2019               Impression:  Patient is a 59 year old African-American male with a past medical history of end-stage renal disease on hemodialysis, diabetes mellitus type 2, hypertension, history of CVA, hyperlipidemia, history of alcohol abuse who was admitted on January 09, 2020 with chief complaint of Shortness of breath Pulmonary edema Anemia  1)Renal end-stage renal disease Patient has history of ESRD, patient is on Tuesday Thursday Saturday schedule. Patient undergoes dialysis at [facility].  Patient is under [facility] care.  Patient is currently being dialyzed   2)HTN Blood pressure is at goal  3)Anemia of chronic disease with a component of GI bleed Patient may need PRBC We will keep patient on Epogen during dialysis    4) secondary hyperparathyroidism -CKD Mineral-Bone Disorder  Patient has secondary hyperparathyroidism with hyperphosphatemia and hypocalcemia Patient outpatient lab was reviewed the patient had PTH of 490 pg/mL phosphorus of 8.4 with a calcium of 7.8  We will follow up on phosphorus and start binders outpatient   5) fluid overload/pulmonary edema Patient chest x-ray was reviewed which  showed patient has pleural effusion andPulmonary edema  IMPRESSION: Chest x-ray done on August 28 1. Mild cardiomegaly with diffuse bilateral pulmonary opacities most suggestive of pulmonary edema. Recommend clinical correlation and follow-up to resolution.  6) electrolytes   sodium Hyponatremic Secondary to ESRD/fluid overload Inability to get into free water  potassium Hypokalemia Now better    7)Acid base Co2 at goal     Plan:   Patient is being dialyzed today We will try to remove fluid in an effort to help with left arm edema We will keep patient on Epogen   Somnang Mahan s [facility] 01/09/2020, 4:29 PM

## 2020-01-09 NOTE — ED Notes (Signed)
Pt back to bed after complete assist to hallway toilet with wheelchair and O2, no urine prod and only gas, pt cleaned of prior food product

## 2020-01-10 DIAGNOSIS — F1010 Alcohol abuse, uncomplicated: Secondary | ICD-10-CM

## 2020-01-10 DIAGNOSIS — I340 Nonrheumatic mitral (valve) insufficiency: Secondary | ICD-10-CM | POA: Diagnosis present

## 2020-01-10 DIAGNOSIS — I5023 Acute on chronic systolic (congestive) heart failure: Secondary | ICD-10-CM | POA: Diagnosis present

## 2020-01-10 DIAGNOSIS — E43 Unspecified severe protein-calorie malnutrition: Secondary | ICD-10-CM

## 2020-01-10 DIAGNOSIS — E08641 Diabetes mellitus due to underlying condition with hypoglycemia with coma: Secondary | ICD-10-CM

## 2020-01-10 LAB — GLUCOSE, CAPILLARY
Glucose-Capillary: 176 mg/dL — ABNORMAL HIGH (ref 70–99)
Glucose-Capillary: 273 mg/dL — ABNORMAL HIGH (ref 70–99)
Glucose-Capillary: 274 mg/dL — ABNORMAL HIGH (ref 70–99)
Glucose-Capillary: 306 mg/dL — ABNORMAL HIGH (ref 70–99)
Glucose-Capillary: 475 mg/dL — ABNORMAL HIGH (ref 70–99)

## 2020-01-10 LAB — HEMOGLOBIN AND HEMATOCRIT, BLOOD
HCT: 21.8 % — ABNORMAL LOW (ref 39.0–52.0)
Hemoglobin: 6.8 g/dL — ABNORMAL LOW (ref 13.0–17.0)

## 2020-01-10 LAB — BASIC METABOLIC PANEL
Anion gap: 13 (ref 5–15)
BUN: 21 mg/dL — ABNORMAL HIGH (ref 6–20)
CO2: 27 mmol/L (ref 22–32)
Calcium: 8 mg/dL — ABNORMAL LOW (ref 8.9–10.3)
Chloride: 97 mmol/L — ABNORMAL LOW (ref 98–111)
Creatinine, Ser: 2.31 mg/dL — ABNORMAL HIGH (ref 0.61–1.24)
GFR calc Af Amer: 34 mL/min — ABNORMAL LOW (ref >60)
GFR calc non Af Amer: 30 mL/min — ABNORMAL LOW (ref >60)
Glucose, Bld: 164 mg/dL — ABNORMAL HIGH (ref 70–99)
Potassium: 3.2 mmol/L — ABNORMAL LOW (ref 3.5–5.1)
Sodium: 137 mmol/L (ref 135–145)

## 2020-01-10 LAB — HEPATITIS B SURFACE ANTIGEN: Hepatitis B Surface Ag: NONREACTIVE

## 2020-01-10 MED ORDER — HYDRALAZINE HCL 25 MG PO TABS
25.0000 mg | ORAL_TABLET | Freq: Three times a day (TID) | ORAL | Status: DC
  Administered 2020-01-10 – 2020-01-13 (×8): 25 mg via ORAL
  Filled 2020-01-10 (×7): qty 1

## 2020-01-10 MED ORDER — HEPARIN SODIUM (PORCINE) 5000 UNIT/ML IJ SOLN
5000.0000 [IU] | Freq: Three times a day (TID) | INTRAMUSCULAR | Status: DC
  Administered 2020-01-10 – 2020-01-12 (×7): 5000 [IU] via SUBCUTANEOUS
  Filled 2020-01-10 (×7): qty 1

## 2020-01-10 MED ORDER — POTASSIUM CHLORIDE 20 MEQ PO PACK
20.0000 meq | PACK | Freq: Once | ORAL | Status: AC
  Administered 2020-01-10: 20 meq via ORAL
  Filled 2020-01-10: qty 1

## 2020-01-10 MED ORDER — INSULIN ASPART 100 UNIT/ML ~~LOC~~ SOLN
0.0000 [IU] | Freq: Three times a day (TID) | SUBCUTANEOUS | Status: DC
  Administered 2020-01-10: 4 [IU] via SUBCUTANEOUS
  Administered 2020-01-11: 5 [IU] via SUBCUTANEOUS
  Administered 2020-01-11: 1 [IU] via SUBCUTANEOUS
  Administered 2020-01-11: 4 [IU] via SUBCUTANEOUS
  Administered 2020-01-12: 2 [IU] via SUBCUTANEOUS
  Administered 2020-01-12: 1 [IU] via SUBCUTANEOUS
  Administered 2020-01-13: 5 [IU] via SUBCUTANEOUS
  Administered 2020-01-13: 4 [IU] via SUBCUTANEOUS
  Filled 2020-01-10 (×7): qty 1

## 2020-01-10 MED ORDER — HYDRALAZINE HCL 50 MG PO TABS: 25.0000 mg | ORAL_TABLET | Freq: Three times a day (TID) | ORAL | Status: DC

## 2020-01-10 NOTE — Progress Notes (Signed)
OT Cancellation Note  Patient Details Name: [name] MRN: [id number] DOB: [birth year]   Cancelled Treatment:    Reason Eval/Treat Not Completed: Patient not medically ready. Per chart review, pt noted to have most recent H&H 6.7 & 20.7, awaiting repeated labs this AM. Will hold pending tests and initiate services as appropriate.   Dessie Coma, M.S. OTR/L  01/10/20, 8:12 AM  ascom [phone]

## 2020-01-10 NOTE — Plan of Care (Signed)
  Problem: Clinical Measurements: Goal: Dialysis access will remain free of complications Outcome: Progressing   Problem: Fluid Volume: Goal: Fluid volume balance will be maintained or improved Outcome: Progressing   Problem: Nutritional: Goal: Ability to make appropriate dietary choices will improve Outcome: Progressing   Problem: Respiratory: Goal: Respiratory symptoms related to disease process will be avoided Outcome: Progressing

## 2020-01-10 NOTE — Progress Notes (Signed)
[name]  MRN: [id number]  DOB/AGE: April 29, 1961 59 y.o.  Primary Care Physician:Hande, Cherlyn Labella, MD  Admit date: 01/09/2020  Chief Complaint:  Chief Complaint  Patient presents with  . Shortness of Breath    S-Pt presented on  01/09/2020 with  Chief Complaint  Patient presents with  . Shortness of Breath  . Patient offered no new complaints . Patient main complaint in today visit was "will be going home today?"  Medications  Patient medication reviewed      CXK:GYJEH from the symptoms mentioned above,there are no other symptoms referable to all systems reviewed.  Physical Exam: Vital signs in last 24 hours: Temp:  [98.1 F (36.7 C)-98.2 F (36.8 C)] 98.1 F (36.7 C) (08/29 0800) Pulse Rate:  [71-98] 92 (08/29 0800) Resp:  [16-21] 16 (08/29 0800) BP: (131-150)/(75-96) 143/78 (08/29 0800) SpO2:  [94 %-98 %] 98 % (08/29 0800) Weight change:     Intake/Output from previous day: No intake/output data recorded. No intake/output data recorded.   Physical Exam: General- pt is awake,alert, oriented to time place and person Resp- No acute REsp distress, decreased breath sound at bases, Rhonchi +  CVS- S1S2 regular in rate and rhythm GIT- BS+, soft, NT, ND EXT- NO LE Edema, Cyanosis Access patient has permacath in situ  Lab Results: CBC Recent Labs    01/09/20 0840 01/09/20 0840 01/09/20 1507 01/10/20 0642  WBC 6.1  --  5.0  --   HGB 7.4*   < > 6.7* 6.8*  HCT 23.1*   < > 20.7* 21.8*  PLT 147*  --  144*  --    < > = values in this interval not displayed.    BMET Recent Labs    01/09/20 0840 01/09/20 0840 01/09/20 1507 01/10/20 0144  NA 131*  --   --  137  K 3.6  --   --  3.2*  CL 96*  --   --  97*  CO2 26  --   --  27  GLUCOSE 465*  --   --  164*  BUN 39*  --   --  21*  CREATININE 3.29*   < > 2.19* 2.31*  CALCIUM 8.2*  --   --  8.0*   < > = values in this interval not displayed.    MICRO Recent Results (from the past 240 hour(s))   SARS Coronavirus 2 by RT PCR (hospital order, performed in [facility] hospital lab) Nasopharyngeal Nasopharyngeal Swab     Status: None   Collection Time: 01/09/20  8:40 AM   Specimen: Nasopharyngeal Swab  Result Value Ref Range Status   SARS Coronavirus 2 NEGATIVE NEGATIVE Final    Comment: (NOTE) SARS-CoV-2 target nucleic acids are NOT DETECTED.  The SARS-CoV-2 RNA is generally detectable in upper and lower respiratory specimens during the acute phase of infection. The lowest concentration of SARS-CoV-2 viral copies this assay can detect is 250 copies / mL. A negative result does not preclude SARS-CoV-2 infection and should not be used as the sole basis for treatment or other patient management decisions.  A negative result may occur with improper specimen collection / handling, submission of specimen other than nasopharyngeal swab, presence of viral mutation(s) within the areas targeted by this assay, and inadequate number of viral copies (<250 copies / mL). A negative result must be combined with clinical observations, patient history, and epidemiological information.  Fact Sheet for Patients:   StrictlyIdeas.no  Fact Sheet for Healthcare Providers: BankingDealers.co.za  This  test is not yet approved or  cleared by the [place] and has been authorized for detection and/or diagnosis of SARS-CoV-2 by FDA under an Emergency Use Authorization (EUA).  This EUA will remain in effect (meaning this test can be used) for the duration of the COVID-19 declaration under Section 564(b)(1) of the Act, 21 U.S.C. section 360bbb-3(b)(1), unless the authorization is terminated or revoked sooner.  Performed at [facility], [address] [place], [place] [address]       Lab Results  Component Value Date   CALCIUM 8.0 (L) 01/10/2020   CAION 1.00 (L) 12/25/2019   PHOS 6.6 (H) 12/26/2019                Impression:  Patient is a 59 year old African-American male with a past medical history of end-stage renal disease on hemodialysis, diabetes mellitus type 2, hypertension, history of CVA, hyperlipidemia, history of alcohol abuse who was admitted on January 09, 2020 with chief complaint of Shortness of breath Pulmonary edema Anemia  1)Renal end-stage renal disease Patient has history of ESRD, patient is on Tuesday Thursday Saturday schedule. Patient undergoes dialysis at [facility].  Patient is under [place] care.  Patient was last dialyzed yesterday   2)HTN Blood pressure is at goal  3)Anemia of chronic disease with a component of GI bleed Patient may need PRBC We will keep patient on Epogen during dialysis    4) secondary hyperparathyroidism -CKD Mineral-Bone Disorder  Patient has secondary hyperparathyroidism with hyperphosphatemia and hypocalcemia Patient outpatient lab was reviewed the patient had PTH of 490 pg/mL phosphorus of 8.4 with a calcium of 7.8  We will follow up on phosphorus and start binders outpatient   5) fluid overload/pulmonary edema Patient chest x-ray was reviewed which showed patient has pleural effusion andPulmonary edema  IMPRESSION: Chest x-ray done on August 28 1. Mild cardiomegaly with diffuse bilateral pulmonary opacities most suggestive of pulmonary edema. Recommend clinical correlation and follow-up to resolution.  6) electrolytes   sodium Hyponatremic Secondary to ESRD/fluid overload Inability to get into free water Now better  potassium Hypokalemia Will replete    7)Acid base Co2 at goal     Plan:  No need for renal replacement therapy today We will repeat KCl   [name] s [name] 01/10/2020, 10:15 AM

## 2020-01-10 NOTE — Progress Notes (Signed)
[name] XTK:[id number] DOB: January 16, 1961 DOA: 01/09/2020 PCP: [name], MD  HPI/Recap of past 28 hours: 59 year old male past medical history of end-stage renal disease on hemodialysis, chronic systolic heart failure with severe pulmonary hypertension and mitral regurg, diabetes and alcohol abuse came to the emergency room on 8/28 after hemodialysis and found to be short winded.  He has been recently discharged I & D 8/23 with 4 L of oxygen and at that time had been admitted for anemia and GI work-up and received a blood transfusion. In the emergency room, he was noted to be hypoxic requiring 6 L.  Chest x-ray noted pulmonary edema.  Admitted to the hospitalist service.  Seen by nephrology and taken for further dialysis.  Today, patient is feeling somewhat better.  Requiring 6 L of oxygen although says his breathing is better.  Assessment/Plan: Active Problems:   Uncontrolled diabetes mellitus ([place]): Elevated blood sugars.  On very sensitive sliding scale.   Alcohol abuse: Stable for now, no evidence of alcohol withdrawal.   Tobacco abuse   HTN (hypertension): Blood pressure stable for now.   Protein-calorie malnutrition, severe: Nutrition consulted.   ESRD on dialysis [facility]): As below.  Nephrology following.  Next dialysis planned for Tuesday.    Acute on chronic respiratory failure with hypoxemia (HCC) secondary to volume overload, possibly from previous recent blood transfusion causing acute on chronic systolic heart failure causing acute on chronic respiratory failure with hypoxia: Currently on 6 L.  Underwent dialysis   Code Status: Full Code   Family Communication: Left message for family   Disposition Plan: Potential discharge tomorrow or Tuesday postdialysis.  As per nephrology   Consultants:  Nephrology  Procedures:  Status post dialysis 8/28  Antimicrobials:  None  DVT prophylaxis: Heparin   Objective: Vitals:   01/10/20 0800  01/10/20 1200  BP: (!) 143/78 135/68  Pulse: 92 82  Resp: 16 20  Temp: 98.1 F (36.7 C) 97.8 F (36.6 C)  SpO2: 98% 100%   No intake or output data in the 24 hours ending 01/10/20 1636 Filed Weights   01/09/20 0805  Weight: 61.2 kg   Body mass index is 18.83 kg/m.  Exam:   General: Alert and oriented x3, withdrawn  Cardiovascular: Regular rate and rhythm, S1-S2  Respiratory: Clear to auscultation bilaterally  Abdomen: Soft, nontender, nondistended, positive bowel sounds  Musculoskeletal: No clubbing or cyanosis, trace pitting edema  Skin: No skin breaks, tears or lesions  Psychiatry: Appropriate, no evidence of psychoses   Data Reviewed: CBC: Recent Labs  Lab 01/04/20 0923 01/09/20 0840 01/09/20 1507 01/10/20 0642  WBC 4.7 6.1 5.0  --   NEUTROABS  --  5.0  --   --   HGB 8.5* 7.4* 6.7* 6.8*  HCT 27.5* 23.1* 20.7* 21.8*  MCV 98.2 96.3 93.7  --   PLT 186 147* 144*  --    Basic Metabolic Panel: Recent Labs  Lab 01/04/20 0923 01/09/20 0840 01/09/20 1507 01/10/20 0144  NA 139 131*  --  137  K 4.2 3.6  --  3.2*  CL 101 96*  --  97*  CO2 26 26  --  27  GLUCOSE 119* 465*  --  164*  BUN 26* 39*  --  21*  CREATININE 4.17* 3.29* 2.19* 2.31*  CALCIUM 8.1* 8.2*  --  8.0*  MG 2.1  --   --   --    GFR: Estimated Creatinine Clearance: 29.4 mL/min (A) (by C-G formula  based on SCr of 2.31 mg/dL (H)). Liver Function Tests: No results for input(s): AST, ALT, ALKPHOS, BILITOT, PROT, ALBUMIN in the last 168 hours. No results for input(s): LIPASE, AMYLASE in the last 168 hours. No results for input(s): AMMONIA in the last 168 hours. Coagulation Profile: No results for input(s): INR, PROTIME in the last 168 hours. Cardiac Enzymes: No results for input(s): CKTOTAL, CKMB, CKMBINDEX, TROPONINI in the last 168 hours. BNP (last 3 results) No results for input(s): PROBNP in the last 8760 hours. HbA1C: No results for input(s): HGBA1C in the last 72 hours. CBG: Recent  Labs  Lab 01/04/20 1511 01/09/20 1643 01/10/20 0036 01/10/20 0803 01/10/20 1150  GLUCAP 152* 316* 176* 274* 475*   Lipid Profile: No results for input(s): CHOL, HDL, LDLCALC, TRIG, CHOLHDL, LDLDIRECT in the last 72 hours. Thyroid Function Tests: No results for input(s): TSH, T4TOTAL, FREET4, T3FREE, THYROIDAB in the last 72 hours. Anemia Panel: No results for input(s): VITAMINB12, FOLATE, FERRITIN, TIBC, IRON, RETICCTPCT in the last 72 hours. Urine analysis:    Component Value Date/Time   COLORURINE YELLOW (A) 07/06/2019 0144   APPEARANCEUR CLOUDY (A) 07/06/2019 0144   APPEARANCEUR Clear 01/17/2014 1551   LABSPEC 1.017 07/06/2019 0144   LABSPEC 1.011 01/17/2014 1551   PHURINE 7.0 07/06/2019 0144   GLUCOSEU >=500 (A) 07/06/2019 0144   GLUCOSEU Negative 01/17/2014 1551   HGBUR SMALL (A) 07/06/2019 0144   BILIRUBINUR NEGATIVE 07/06/2019 0144   BILIRUBINUR Negative 01/17/2014 1551   KETONESUR NEGATIVE 07/06/2019 0144   PROTEINUR >=300 (A) 07/06/2019 0144   NITRITE NEGATIVE 07/06/2019 0144   LEUKOCYTESUR NEGATIVE 07/06/2019 0144   LEUKOCYTESUR 1+ 01/17/2014 1551   Sepsis Labs: @LABRCNTIP (procalcitonin:4,lacticidven:4)  ) Recent Results (from the past 240 hour(s))  SARS Coronavirus 2 by RT PCR (hospital order, performed in [place] hospital lab) Nasopharyngeal Nasopharyngeal Swab     Status: None   Collection Time: 01/09/20  8:40 AM   Specimen: Nasopharyngeal Swab  Result Value Ref Range Status   SARS Coronavirus 2 NEGATIVE NEGATIVE Final    Comment: (NOTE) SARS-CoV-2 target nucleic acids are NOT DETECTED.  The SARS-CoV-2 RNA is generally detectable in upper and lower respiratory specimens during the acute phase of infection. The lowest concentration of SARS-CoV-2 viral copies this assay can detect is 250 copies / mL. A negative result does not preclude SARS-CoV-2 infection and should not be used as the sole basis for treatment or other patient management decisions.   A negative result may occur with improper specimen collection / handling, submission of specimen other than nasopharyngeal swab, presence of viral mutation(s) within the areas targeted by this assay, and inadequate number of viral copies (<250 copies / mL). A negative result must be combined with clinical observations, patient history, and epidemiological information.  Fact Sheet for Patients:   StrictlyIdeas.no  Fact Sheet for Healthcare Providers: BankingDealers.co.za  This test is not yet approved or  cleared by the [place] FDA and has been authorized for detection and/or diagnosis of SARS-CoV-2 by FDA under an Emergency Use Authorization (EUA).  This EUA will remain in effect (meaning this test can be used) for the duration of the COVID-19 declaration under Section 564(b)(1) of the Act, 21 U.S.C. section 360bbb-3(b)(1), unless the authorization is terminated or revoked sooner.  Performed at [facility], [address] [place], [place] [address]       Studies: No results found.  Scheduled Meds: . aspirin EC  81 mg Oral Daily  . carvedilol  6.25  mg Oral BID WC  . Chlorhexidine Gluconate Cloth  6 each Topical Q0600  . epoetin (EPOGEN/PROCRIT) injection  10,000 Units Subcutaneous Q T,Th,Sa-HD  . feeding supplement (NEPRO CARB STEADY)  237 mL Oral TID BM  . folic acid  1 mg Oral Daily  . heparin  5,000 Units Subcutaneous Q8H  . hydrALAZINE  25 mg Oral Q8H  . insulin aspart  0-6 Units Subcutaneous TID WC  . insulin glargine  8 Units Subcutaneous Daily  . iron polysaccharides  150 mg Oral Daily  . melatonin  5 mg Oral QHS  . multivitamin  1 tablet Oral Daily  . potassium chloride  20 mEq Oral Once  . sacubitril-valsartan  1 tablet Oral BID  . sertraline  100 mg Oral Daily  . sodium chloride flush  3 mL Intravenous Q12H    Continuous Infusions: . sodium chloride    . sodium chloride    . sodium chloride        LOS: 1 day     Annita Brod, MD [facility]   01/10/2020, 4:36 PM

## 2020-01-10 NOTE — Progress Notes (Signed)
PT Cancellation Note  Patient Details Name: [name] MRN: [id number] DOB: 1961-04-27   Cancelled Treatment:    Reason Eval/Treat Not Completed: Medical issues which prohibited therapy;Other (comment) (Patient not medically ready. Per chart review, pt noted to have most recent H&H 6.7 & 20.7, awaiting repeated labs this AM. Will hold pending tests and initiate services as appropriate.)  Janna Arch, PT, DPT   01/10/2020, 9:43 AM

## 2020-01-10 NOTE — Progress Notes (Signed)
Paged Dr. Sidney Ace about a drop in H&H from 7.4 & 23.1 in the AM yesterday to 6.7 & 20.7 at 3PM yesterday. Dr. Sidney Ace ordered a repeat of H&H at 6AM and to page the AM Dr. with the results. Will continue to monitor.

## 2020-01-11 LAB — CBC
HCT: 22.7 % — ABNORMAL LOW (ref 39.0–52.0)
Hemoglobin: 7 g/dL — ABNORMAL LOW (ref 13.0–17.0)
MCH: 30.2 pg (ref 26.0–34.0)
MCHC: 30.8 g/dL (ref 30.0–36.0)
MCV: 97.8 fL (ref 80.0–100.0)
Platelets: 172 10*3/uL (ref 150–400)
RBC: 2.32 MIL/uL — ABNORMAL LOW (ref 4.22–5.81)
RDW: 15.3 % (ref 11.5–15.5)
WBC: 4.9 10*3/uL (ref 4.0–10.5)
nRBC: 0 % (ref 0.0–0.2)

## 2020-01-11 LAB — BASIC METABOLIC PANEL
Anion gap: 8 (ref 5–15)
BUN: 34 mg/dL — ABNORMAL HIGH (ref 6–20)
CO2: 30 mmol/L (ref 22–32)
Calcium: 8.1 mg/dL — ABNORMAL LOW (ref 8.9–10.3)
Chloride: 103 mmol/L (ref 98–111)
Creatinine, Ser: 3.78 mg/dL — ABNORMAL HIGH (ref 0.61–1.24)
GFR calc Af Amer: 19 mL/min — ABNORMAL LOW (ref >60)
GFR calc non Af Amer: 16 mL/min — ABNORMAL LOW (ref >60)
Glucose, Bld: 192 mg/dL — ABNORMAL HIGH (ref 70–99)
Potassium: 3.7 mmol/L (ref 3.5–5.1)
Sodium: 141 mmol/L (ref 135–145)

## 2020-01-11 LAB — GLUCOSE, CAPILLARY
Glucose-Capillary: 161 mg/dL — ABNORMAL HIGH (ref 70–99)
Glucose-Capillary: 314 mg/dL — ABNORMAL HIGH (ref 70–99)
Glucose-Capillary: 374 mg/dL — ABNORMAL HIGH (ref 70–99)
Glucose-Capillary: 189 mg/dL — ABNORMAL HIGH (ref 70–99)

## 2020-01-11 MED ORDER — DIPHENHYDRAMINE HCL 25 MG PO CAPS
25.0000 mg | ORAL_CAPSULE | Freq: Once | ORAL | Status: AC
  Administered 2020-01-11: 25 mg via ORAL
  Filled 2020-01-11: qty 1

## 2020-01-11 MED ORDER — THIAMINE HCL 100 MG PO TABS
100.0000 mg | ORAL_TABLET | Freq: Every day | ORAL | Status: DC
  Administered 2020-01-12 – 2020-01-13 (×2): 100 mg via ORAL
  Filled 2020-01-11 (×2): qty 1

## 2020-01-11 MED FILL — Hydrocodone-Acetaminophen Tab 5-325 MG: ORAL | Qty: 1 | Status: AC

## 2020-01-11 NOTE — Progress Notes (Signed)
Initial Nutrition Assessment  DOCUMENTATION CODES:   Severe malnutrition in context of chronic illness  INTERVENTION:  Continue Nepro Shake po TID, each supplement provides 425 kcal and 19 grams protein.  Provide Rena-vite QHS.  Encouraged adequate intake of calories and protein at meals.  NUTRITION DIAGNOSIS:   Severe Malnutrition related to chronic illness (ESRD on HD, CHF) as evidenced by severe fat depletion, severe muscle depletion.  GOAL:   Patient will meet greater than or equal to 90% of their needs  MONITOR:   PO intake, Supplement acceptance, Labs, Weight trends, I & O's  REASON FOR ASSESSMENT:   Consult Assessment of nutrition requirement/status  ASSESSMENT:   59 year old male with PMHx of DM, HTN, ESRD on HD, hx EtOH use, GERD, hx CVA, CHF admitted with respiratory failure, volume overload.   Met with patient at bedside. He reports he has a decreased appetite now and is not able to eat well at meals. He reports his appetite has been decreased at home lately, as well. He eats 3 meals per day at home and they are usually prepared by wife. Patient reports lately he is only eating around 50% of his meals or less. Unable to describe exact intake at meals. He reports he is not eating well here as the food is cold by the time it arrives. Patient is amenable to drinking oral nutrition supplements to help meet calorie/protein needs. He is already ordered for Nepro TID. Discussed importance of adequate intake.  Patient reports his UBW was 185 lbs. Noted in chart patient was 171.6 lbs on 06/29/2016 and lost 45 lbs (27.4% body weight) over one year. Since then weight has fluctuated. Patient is currently 61.2 kg (134.9 lbs).  Medications reviewed and include: folic acid 1 mg daily, Novolog 0-6 units TID, Lantus 8 units daily, thiamine 100 mg daily.  Labs reviewed: CBG 161-314, BUN 34, Creatinine 3.78.  NUTRITION - FOCUSED PHYSICAL EXAM:    Most Recent Value  Orbital Region  Severe depletion  Upper Arm Region Severe depletion  Thoracic and Lumbar Region Moderate depletion  Buccal Region Severe depletion  Temple Region Severe depletion  Clavicle Bone Region Severe depletion  Clavicle and Acromion Bone Region Severe depletion  Scapular Bone Region Moderate depletion  Dorsal Hand Severe depletion  Patellar Region Severe depletion  Anterior Thigh Region Severe depletion  Posterior Calf Region Severe depletion  Edema (RD Assessment) None  Hair Reviewed  Eyes Reviewed  Mouth Reviewed  Skin Reviewed  Nails Reviewed     Diet Order:   Diet Order            Diet 2 gram sodium Room service appropriate? Yes; Fluid consistency: Thin  Diet effective now                EDUCATION NEEDS:   Not appropriate for education at this time  Skin:  Skin Assessment: Reviewed RN Assessment (s/p amputation left second toe)  Last BM:  01/11/2020 per chart  Height:   Ht Readings from Last 1 Encounters:  01/09/20 5' 11"  (1.803 m)   Weight:   Wt Readings from Last 1 Encounters:  01/11/20 61.2 kg   BMI:  Body mass index is 18.81 kg/m.  Estimated Nutritional Needs:   Kcal:  1800-2000  Protein:  90-100 grams  Fluid:  UOP + 1 L  Jacklynn Barnacle, MS, RD, LDN Pager number available on Amion

## 2020-01-11 NOTE — Progress Notes (Signed)
Inpatient Diabetes Program Recommendations  AACE/ADA: New Consensus Statement on Inpatient Glycemic Control   Target Ranges:  Prepandial:   less than 140 mg/dL      Peak postprandial:   less than 180 mg/dL (1-2 hours)      Critically ill patients:  140 - 180 mg/dL   Results for [name], [name] (MRN [id number]) as of 01/11/2020 09:49  Ref. Range 01/10/2020 08:03 01/10/2020 11:50 01/10/2020 17:25 01/10/2020 21:17 01/11/2020 07:46  Glucose-Capillary Latest Ref Range: 70 - 99 mg/dL 274 (H) 475 (H) 306 (H) 273 (H) 161 (H)   Review of Glycemic Control  Diabetes history: DM2 Outpatient Diabetes medications: Lantus 4 units daily, Humalog 3 units TID with meals Current orders for Inpatient glycemic control: Lantus 8 units daily, Novolog 0-6 units TID with meals  Inpatient Diabetes Program Recommendations:    Insulin: If post prandial glucose continues to be greater than 180 mg/dl, please consider ordering Novolog 2 units TID with meals for meal coverage if patient eats at least 50% of meals.  Thanks, Barnie Alderman, RN, MSN, CDE Diabetes Coordinator Inpatient Diabetes Program [phone] (Team Pager from 8am to 5pm)

## 2020-01-11 NOTE — Evaluation (Signed)
Physical Therapy Evaluation Patient Details Name: [name] MRN: [id number] DOB: [birth year] Today's Date: 01/11/2020   History of Present Illness  Pt is a 59 y/o M with PMH: ESRD on HD TTS, chronic hypoxic respiratory failure, DM with multiple hyerglycemia admissions, multiple CVA, HTN, ETOH abuse, 2nd toe amputation (12/11/19). Pt recently d/c'ed 8/23 with 4 L of oxygen and at that time had been admitted for anemia and GI work-up and received a blood transfusion. On this admission, pt presented SOB following HD. Pt req'ed 6Lnc at the time. CXR w/ pulmonary edema. Pt adm for acute on chronic respiratory failure with hypoxemia.  Clinical Impression  Pt lying in bed resting on 6L O2 via nasal cannula and required max encouragement to participate today. Pt agreed to minimal physical activity and was provided education on importance of mobility and role of PT in acute setting. Pt agreeable to perform bed mobility however refused upright or OOB activities. Pt with minimal reports of SOB with bed flat in supine and pt able to perform scooting to [facility] utilizing bedrails with BUE, powering through BLE, and mod A for hip management via chuck pad. Pt presents with decreased strength overall and poor motivation this session. Anticipate pt will need assistance for supine <> sit, other bed mobility, and ambulation with use of RW. Pt would benefit from skilled therapy this session to address aforementioned deficits and HHPT with supervision for mobility at discharge to optimize return to PLOF and maximize functional mobility and decrease falls risk.    Follow Up Recommendations Home health PT;Supervision for mobility/OOB    Equipment Recommendations  None recommended by PT (pt already owns [facility] and [facility])    Recommendations for Other Services       Precautions / Restrictions Precautions Precautions: Fall Required Braces or Orthoses: Other Brace Other Brace: had post-op shoe from a previous admission (toe amp  that occurred 12/11/19). Pt is likely still in window of wear time. Restrictions Weight Bearing Restrictions: No LLE Weight Bearing: Weight bearing as tolerated LLE Partial Weight Bearing Percentage or Pounds: Per previous PT note, order states pt was heel WB in post-op shoe.      Mobility  Bed Mobility Overal bed mobility: Needs Assistance Bed Mobility:  (scooting) Rolling: Min guard;Min assist         General bed mobility comments: pt refused to perform rolling or upright activities; pt agreeable to perform scooting up in bed and utilized bilateral bedrails, verbal cues to bend knees and power through BLE and mod A at hips to scoot  Transfers                 General transfer comment: pt refused  Ambulation/Gait             General Gait Details: pt refused OOB attempts  Stairs            Wheelchair Mobility    Modified Rankin (Stroke Patients Only)       Balance                                             Pertinent Vitals/Pain Pain Assessment: Faces Faces Pain Scale: Hurts little more Pain Location: generalized to most of his body Pain Descriptors / Indicators: Discomfort Pain Intervention(s): Monitored during session;Repositioned    Home Living Family/patient expects to be discharged to:: Private residence Living Arrangements: Spouse/significant  other Available Help at Discharge: Family;Available PRN/intermittently Type of Home: House Home Access: Level entry     Home Layout: One level Home Equipment: Grab bars - tub/shower;Grab bars - toilet;Cane - single point;Walker - 2 wheels Additional Comments: pt somewhat confused, increased processing time. questionable historian, some PLOF pulled from previous therapy evaluations. Somewhat conflicting information as pt reports still being able to walk with walker, but then describes needing assist.    Prior Function Level of Independence: Needs assistance   Gait / Transfers  Assistance Needed: Pt reported ambulation with RW with anywhere from supervision to min A, required "medium" assistance to stand from a chair; pt also reports recent home O2 usage x 4 months and states that he hasn't been able to get off of it since  ADL's / Homemaking Assistance Needed: pt stated that he had an aide that assisted with taking him to pay bills and ADLs however he states that "she just quit" and he needs someone to help him with things like that  Comments: pt endorses history of falls, unable to recall how many in 2021     Hand Dominance   Dominant Hand: Left    Extremity/Trunk Assessment   Upper Extremity Assessment Upper Extremity Assessment: Generalized weakness;Defer to OT evaluation    Lower Extremity Assessment Lower Extremity Assessment: Generalized weakness (grossly 3+ to 4-/5 bilaterally)       Communication   Communication: No difficulties  Cognition Arousal/Alertness: Awake/alert Behavior During Therapy: WFL for tasks assessed/performed Overall Cognitive Status: No family/caregiver present to determine baseline cognitive functioning                                 General Comments: Pt alert and oriented to self (name/DOB), reported location as "[place]", situation of being SOB, and states date as Sept 1921. When asked if it was 1921 or 2021, he stated "2021".      General Comments      Exercises Other Exercises Other Exercises: OT facilittes ed re: importance of OOB activity to maintain strength, prevent skin breakdown and prevent PNA. Pt agreeable to education, but not agreeable to sitting up today citing fatigue. Other Exercises: OT engages pt in grasp squeezes using wash cloth for light resistance for 1 set x10 reps B'ly. Other Exercises: pt educated on role of PT in acute setting, goals to achieve, and benefits of mobility/activity/OOB/sitting up in chair   Assessment/Plan    PT Assessment Patient needs continued PT services   PT Problem List Decreased strength;Decreased activity tolerance;Decreased balance;Decreased mobility;Decreased coordination;Decreased cognition;Cardiopulmonary status limiting activity;Pain       PT Treatment Interventions DME instruction;Gait training;Stair training;Functional mobility training;Therapeutic activities;Therapeutic exercise;Balance training;Patient/family education    PT Goals (Current goals can be found in the Care Plan section)  Acute Rehab PT Goals Patient Stated Goal: to return home PT Goal Formulation: With patient Time For Goal Achievement: 01/25/20 Potential to Achieve Goals: Fair    Frequency Min 2X/week   Barriers to discharge Decreased caregiver support      Co-evaluation               AM-PAC PT "6 Clicks" Mobility  Outcome Measure Help needed turning from your back to your side while in a flat bed without using bedrails?: A Little Help needed moving from lying on your back to sitting on the side of a flat bed without using bedrails?: A Little Help needed moving to and  from a bed to a chair (including a wheelchair)?: A Little Help needed standing up from a chair using your arms (e.g., wheelchair or bedside chair)?: A Little Help needed to walk in hospital room?: A Lot Help needed climbing 3-5 steps with a railing? : A Lot 6 Click Score: 16    End of Session Equipment Utilized During Treatment: Oxygen (6L O4 via nasal cannula) Activity Tolerance: Patient limited by fatigue;Other (comment) (pt refused activity attempts) Patient left: in bed;with call bell/phone within reach;with bed alarm set Nurse Communication: Mobility status PT Visit Diagnosis: Unsteadiness on feet (R26.81);Muscle weakness (generalized) (M62.81);History of falling (Z91.81);Pain    Time: 8979-1504 PT Time Calculation (min) (ACUTE ONLY): 17 min   Charges:              Vale Haven, SPT  Vale Haven 01/11/2020, 4:34 PM

## 2020-01-11 NOTE — Progress Notes (Signed)
[facility]  ROUNDING NOTE   Subjective:   Wife at bedside.  Patient has many questions about why he is admitted to the hospital. Continues to complain of shortness of breath.   Objective:  Vital signs in last 24 hours:  Temp:  [97.7 F (36.5 C)-98.4 F (36.9 C)] 98.4 F (36.9 C) (08/30 0745) Pulse Rate:  [78-88] 78 (08/30 1138) Resp:  [16-20] 19 (08/30 1138) BP: (122-151)/(63-87) 123/73 (08/30 1138) SpO2:  [92 %-100 %] 92 % (08/30 1138) Weight:  [61.2 kg] 61.2 kg (08/30 0353)  Weight change: -0.045 kg Filed Weights   01/09/20 0805 01/11/20 0353  Weight: 61.2 kg 61.2 kg    Intake/Output: No intake/output data recorded.   Intake/Output this shift:  Total I/O In: 360 [P.O.:360] Out: -   Physical Exam: General: NAD,   Head: Normocephalic, atraumatic. Moist oral mucosal membranes  Eyes: Anicteric, PERRL  Neck: Supple, trachea midline  Lungs:  Clear to auscultation  Heart: Regular rate and rhythm  Abdomen:  Soft, nontender,   Extremities:  no peripheral edema.  Neurologic: Nonfocal, moving all four extremities  Skin: No lesions  Access: RIJ permcath    Basic Metabolic Panel: Recent Labs  Lab 01/09/20 0840 01/09/20 1507 01/10/20 0144 01/11/20 0504  NA 131*  --  137 141  K 3.6  --  3.2* 3.7  CL 96*  --  97* 103  CO2 26  --  27 30  GLUCOSE 465*  --  164* 192*  BUN 39*  --  21* 34*  CREATININE 3.29* 2.19* 2.31* 3.78*  CALCIUM 8.2*  --  8.0* 8.1*    Liver Function Tests: No results for input(s): AST, ALT, ALKPHOS, BILITOT, PROT, ALBUMIN in the last 168 hours. No results for input(s): LIPASE, AMYLASE in the last 168 hours. No results for input(s): AMMONIA in the last 168 hours.  CBC: Recent Labs  Lab 01/09/20 0840 01/09/20 1507 01/10/20 0642 01/11/20 0504  WBC 6.1 5.0  --  4.9  NEUTROABS 5.0  --   --   --   HGB 7.4* 6.7* 6.8* 7.0*  HCT 23.1* 20.7* 21.8* 22.7*  MCV 96.3 93.7  --  97.8  PLT 147* 144*  --  172    Cardiac  Enzymes: No results for input(s): CKTOTAL, CKMB, CKMBINDEX, TROPONINI in the last 168 hours.  BNP: Invalid input(s): POCBNP  CBG: Recent Labs  Lab 01/10/20 1150 01/10/20 1725 01/10/20 2117 01/11/20 0746 01/11/20 1139  GLUCAP 475* 306* 273* 161* 314*    Microbiology: Results for orders placed or performed during the hospital encounter of 01/09/20  SARS Coronavirus 2 by RT PCR (hospital order, performed in [facility] hospital lab) Nasopharyngeal Nasopharyngeal Swab     Status: None   Collection Time: 01/09/20  8:40 AM   Specimen: Nasopharyngeal Swab  Result Value Ref Range Status   SARS Coronavirus 2 NEGATIVE NEGATIVE Final    Comment: (NOTE) SARS-CoV-2 target nucleic acids are NOT DETECTED.  The SARS-CoV-2 RNA is generally detectable in upper and lower respiratory specimens during the acute phase of infection. The lowest concentration of SARS-CoV-2 viral copies this assay can detect is 250 copies / mL. A negative result does not preclude SARS-CoV-2 infection and should not be used as the sole basis for treatment or other patient management decisions.  A negative result may occur with improper specimen collection / handling, submission of specimen other than nasopharyngeal swab, presence of viral mutation(s) within the areas targeted by this assay, and inadequate number  of viral copies (<250 copies / mL). A negative result must be combined with clinical observations, patient history, and epidemiological information.  Fact Sheet for Patients:   StrictlyIdeas.no  Fact Sheet for Healthcare Providers: BankingDealers.co.za  This test is not yet approved or  cleared by the [place] FDA and has been authorized for detection and/or diagnosis of SARS-CoV-2 by FDA under an Emergency Use Authorization (EUA).  This EUA will remain in effect (meaning this test can be used) for the duration of the COVID-19 declaration under Section  564(b)(1) of the Act, 21 U.S.C. section 360bbb-3(b)(1), unless the authorization is terminated or revoked sooner.  Performed at [facility], [address] [place], [place] [address]     Coagulation Studies: No results for input(s): LABPROT, INR in the last 72 hours.  Urinalysis: No results for input(s): COLORURINE, LABSPEC, PHURINE, GLUCOSEU, HGBUR, BILIRUBINUR, KETONESUR, PROTEINUR, UROBILINOGEN, NITRITE, LEUKOCYTESUR in the last 72 hours.  Invalid input(s): APPERANCEUR    Imaging: No results found.   Medications:    sodium chloride     sodium chloride     sodium chloride      aspirin EC  81 mg Oral Daily   carvedilol  6.25 mg Oral BID WC   Chlorhexidine Gluconate Cloth  6 each Topical Q0600   epoetin (EPOGEN/PROCRIT) injection  10,000 Units Subcutaneous Q T,Th,Sa-HD   feeding supplement (NEPRO CARB STEADY)  237 mL Oral TID BM   folic acid  1 mg Oral Daily   heparin  5,000 Units Subcutaneous Q8H   hydrALAZINE  25 mg Oral Q8H   insulin aspart  0-6 Units Subcutaneous TID WC   insulin glargine  8 Units Subcutaneous Daily   iron polysaccharides  150 mg Oral Daily   melatonin  5 mg Oral QHS   multivitamin  1 tablet Oral Daily   sacubitril-valsartan  1 tablet Oral BID   sertraline  100 mg Oral Daily   sodium chloride flush  3 mL Intravenous Q12H   sodium chloride, sodium chloride, sodium chloride, acetaminophen, alteplase, heparin, heparin, lidocaine (PF), lidocaine-prilocaine, ondansetron (ZOFRAN) IV, pentafluoroprop-tetrafluoroeth, sodium chloride flush  Assessment/ Plan:  [name] is a 58 y.o. black male with end stage renal disease on hemodialysis, diabetes mellitus type 2, hypertension, history of CVA, hyperlipidemia, history of alcohol abuse who was admitted on 01/09/2020 for ESRD on dialysis [facility]) [N18.6, Z99.2] Acute on chronic respiratory failure with hypoxia ([place]) [J96.21] Acute on chronic respiratory failure with  hypoxemia ([place]) [J96.21] Acute on chronic congestive heart failure, unspecified heart failure type ([place]) [I50.9]  [place]. RIJ permcath 61.5kg   1. End Stage Renal Disease: emergent dialysis on admission Next dialysis treatment for tomorrow  2. Hypertension:123/73 at goal. Home regimen of carvedilol, entresto, hydralazine.  [name] is not dialyzed out.  Continue current regimen   3. Anemia of chronic kidney disease: with concern for GI bleed. Hemoglobin 7. Low threshold for transfusion EPO with HD treatment  4. Secondary Hyperparathyroidism: not currently on a binder.   5. Hypokalemia - PO potassium   LOS: 2 [name] 8/30/20213:20 PM

## 2020-01-11 NOTE — Progress Notes (Signed)
Mobility Specialist - Progress Note   01/11/20 1349  Mobility  Activity Refused mobility ((Cancellation))  Mobility performed by Mobility specialist    Per chart review, pt's glucose levels are trending upward (currently 314) which sit outside of safety guidelines for mobility. Will attempt session at a more appropriate time.    Kathee Delton Mobility Specialist 01/11/20, 1:53 PM

## 2020-01-11 NOTE — Progress Notes (Signed)
   Heart Failure Nurse Navigator Note;  Spoke with patient today regarding heart failure and frequent readmission.  He states he was just here last week for same thing.    Discussed diet and fluid restriction and daily weights.  He does not restrict his sodium as he like how food tastes with salt.  He drinks 3 to 5 sodas a day, not much water.  He does not weigh himself daily.  Discussed the importance of restricting sodium and fluid intake.  And by monitoring his weights to help keep him out of the hospital.  He states that his wife does most of the cooking and when she can not he does.  He states he has not seen the heart failure videos nor  does he have a teaching booklet at the bedside.  Instructed that will follow up with him tomorrow.    Pricilla Riffle RN, CHFN

## 2020-01-11 NOTE — Progress Notes (Signed)
PROGRESS NOTE    [name]  ERX:[id number] DOB: 21-Jan-1961 DOA: 01/09/2020 PCP: [name], MD   Brief Narrative:  59 year old male past medical history of end-stage renal disease on hemodialysis, chronic systolic heart failure with severe pulmonary hypertension and mitral regurg, diabetes and alcohol abuse came to the emergency room on 8/28 after hemodialysis and found to be short winded.  He has been recently discharged I & D 8/23 with 4 L of oxygen and at that time had been admitted for anemia and GI work-up and received a blood transfusion. In the emergency room, he was noted to be hypoxic requiring 6 L.  Chest x-ray noted pulmonary edema.  Admitted to the hospitalist service.  Seen by nephrology and taken for further dialysis.  Assessment & Plan:   Principal Problem:   Acute on chronic respiratory failure with hypoxemia (HCC) Active Problems:   Uncontrolled diabetes mellitus (HCC)   Alcohol abuse   Tobacco abuse   HTN (hypertension)   Protein-calorie malnutrition, severe   ESRD on dialysis ([place])   DM (diabetes mellitus), type 2 with complications (HCC)   Acute on chronic systolic CHF (congestive heart failure) (HCC)   Severe mitral regurgitation   Acute on hypoxic respiratory failure on 2 L nasal cannula now 1 6 L nasal cannula Volume overloaded -Secondary to volume overload with mild anemia.  Plans for hemodialysis tomorrow which should help his volume status.  Supportive care, out of bed to chair, incentive spirometer.  Advised RN to wean off oxygen as appropriate.  ESRD on hemodialysis -Nephrology team following, plans for dialysis tomorrow  Chronic systolic congestive heart failure, EF 45%, class III -Currently on supplemental oxygen.  Supportive care.  Plans for dialysis tomorrow.  Anemia of chronic disease -Baseline hemoglobin 8.0.  Admission hemoglobin 6.8 and this morning 7.0.  Tomorrow if less than 7.0 will order 1 unit PRBC transfusion with his  dialysis. -Endoscopy 01/02/20-normal.  Colonoscopy October 2020-multiple polyps and hemorrhoids which were removed.   DVT prophylaxis: heparin injection 5,000 Units Start: 01/10/20 0600 SCDs Start: 01/09/20 9509 Code Status:  Family Communication: Updated patient's wife  Status is: Inpatient  Remains inpatient appropriate because:Inpatient level of care appropriate due to severity of illness   Dispo: The patient is from: Home              Anticipated d/c is to: Home              Anticipated d/c date is: 2 days              Patient currently is not medically stable to d/c.  Plans for hemodialysis, overnight monitoring of hemoglobin with possible transfusion tomorrow.  He still hypoxic requiring 4 to 6 L nasal cannula       Body mass index is 18.81 kg/m.        Subjective: Patient still hypoxic requiring 5-6 L nasal cannula.  Review of Systems Otherwise negative except as per HPI, including: General: Denies fever, chills, night sweats or unintended weight loss. Resp: Denies cough, wheezing, shortness of breath. Cardiac: Denies chest pain, palpitations, orthopnea, paroxysmal nocturnal dyspnea. GI: Denies abdominal pain, nausea, vomiting, diarrhea or constipation GU: Denies dysuria, frequency, hesitancy or incontinence MS: Denies muscle aches, joint pain or swelling Neuro: Denies headache, neurologic deficits (focal weakness, numbness, tingling), abnormal gait Psych: Denies anxiety, depression, SI/HI/AVH Skin: Denies new rashes or lesions ID: Denies sick contacts, exotic exposures, travel  Examination:  General exam: Appears calm and comfortable, 5 L nasal cannula  Respiratory system: Crackles midway up the lungs Cardiovascular system: S1 & S2 heard, RRR. No JVD, murmurs, rubs, gallops or clicks. No pedal edema. Gastrointestinal system: Abdomen is nondistended, soft and nontender. No organomegaly or masses felt. Normal bowel sounds heard. Central nervous system: Alert  and oriented. No focal neurological deficits. Extremities: Symmetric 5 x 5 power. Skin: No rashes, lesions or ulcers Psychiatry: Judgement and insight appear poor    Objective: Vitals:   01/11/20 0353 01/11/20 0356 01/11/20 0745 01/11/20 1138  BP:  (!) 146/84 122/63 123/73  Pulse:  82 83 78  Resp:  20 19 19   Temp:  98.1 F (36.7 C) 98.4 F (36.9 C)   TempSrc:  Oral Oral   SpO2:  100% 99% 92%  Weight: 61.2 kg     Height:        Intake/Output Summary (Last 24 hours) at 01/11/2020 1407 Last data filed at 01/11/2020 1300 Gross per 24 hour  Intake 360 ml  Output --  Net 360 ml   Filed Weights   01/09/20 0805 01/11/20 0353  Weight: 61.2 kg 61.2 kg     Data Reviewed:   CBC: Recent Labs  Lab 01/09/20 0840 01/09/20 1507 01/10/20 0642 01/11/20 0504  WBC 6.1 5.0  --  4.9  NEUTROABS 5.0  --   --   --   HGB 7.4* 6.7* 6.8* 7.0*  HCT 23.1* 20.7* 21.8* 22.7*  MCV 96.3 93.7  --  97.8  PLT 147* 144*  --  263   Basic Metabolic Panel: Recent Labs  Lab 01/09/20 0840 01/09/20 1507 01/10/20 0144 01/11/20 0504  NA 131*  --  137 141  K 3.6  --  3.2* 3.7  CL 96*  --  97* 103  CO2 26  --  27 30  GLUCOSE 465*  --  164* 192*  BUN 39*  --  21* 34*  CREATININE 3.29* 2.19* 2.31* 3.78*  CALCIUM 8.2*  --  8.0* 8.1*   GFR: Estimated Creatinine Clearance: 18 mL/min (A) (by C-G formula based on SCr of 3.78 mg/dL (H)). Liver Function Tests: No results for input(s): AST, ALT, ALKPHOS, BILITOT, PROT, ALBUMIN in the last 168 hours. No results for input(s): LIPASE, AMYLASE in the last 168 hours. No results for input(s): AMMONIA in the last 168 hours. Coagulation Profile: No results for input(s): INR, PROTIME in the last 168 hours. Cardiac Enzymes: No results for input(s): CKTOTAL, CKMB, CKMBINDEX, TROPONINI in the last 168 hours. BNP (last 3 results) No results for input(s): PROBNP in the last 8760 hours. HbA1C: No results for input(s): HGBA1C in the last 72 hours. CBG: Recent  Labs  Lab 01/10/20 1150 01/10/20 1725 01/10/20 2117 01/11/20 0746 01/11/20 1139  GLUCAP 475* 306* 273* 161* 314*   Lipid Profile: No results for input(s): CHOL, HDL, LDLCALC, TRIG, CHOLHDL, LDLDIRECT in the last 72 hours. Thyroid Function Tests: No results for input(s): TSH, T4TOTAL, FREET4, T3FREE, THYROIDAB in the last 72 hours. Anemia Panel: No results for input(s): VITAMINB12, FOLATE, FERRITIN, TIBC, IRON, RETICCTPCT in the last 72 hours. Sepsis Labs: No results for input(s): PROCALCITON, LATICACIDVEN in the last 168 hours.  Recent Results (from the past 240 hour(s))  SARS Coronavirus 2 by RT PCR (hospital order, performed in [facility] hospital lab) Nasopharyngeal Nasopharyngeal Swab     Status: None   Collection Time: 01/09/20  8:40 AM   Specimen: Nasopharyngeal Swab  Result Value Ref Range Status   SARS Coronavirus 2 NEGATIVE NEGATIVE Final    Comment: (  NOTE) SARS-CoV-2 target nucleic acids are NOT DETECTED.  The SARS-CoV-2 RNA is generally detectable in upper and lower respiratory specimens during the acute phase of infection. The lowest concentration of SARS-CoV-2 viral copies this assay can detect is 250 copies / mL. A negative result does not preclude SARS-CoV-2 infection and should not be used as the sole basis for treatment or other patient management decisions.  A negative result may occur with improper specimen collection / handling, submission of specimen other than nasopharyngeal swab, presence of viral mutation(s) within the areas targeted by this assay, and inadequate number of viral copies (<250 copies / mL). A negative result must be combined with clinical observations, patient history, and epidemiological information.  Fact Sheet for Patients:   StrictlyIdeas.no  Fact Sheet for Healthcare Providers: BankingDealers.co.za  This test is not yet approved or  cleared by the [place] FDA and has been  authorized for detection and/or diagnosis of SARS-CoV-2 by FDA under an Emergency Use Authorization (EUA).  This EUA will remain in effect (meaning this test can be used) for the duration of the COVID-19 declaration under Section 564(b)(1) of the Act, 21 U.S.C. section 360bbb-3(b)(1), unless the authorization is terminated or revoked sooner.  Performed at [facility], [address], [place], [place] [address]          Radiology Studies: No results found.      Scheduled Meds: . aspirin EC  81 mg Oral Daily  . carvedilol  6.25 mg Oral BID WC  . Chlorhexidine Gluconate Cloth  6 each Topical Q0600  . epoetin (EPOGEN/PROCRIT) injection  10,000 Units Subcutaneous Q T,Th,Sa-HD  . feeding supplement (NEPRO CARB STEADY)  237 mL Oral TID BM  . folic acid  1 mg Oral Daily  . heparin  5,000 Units Subcutaneous Q8H  . hydrALAZINE  25 mg Oral Q8H  . insulin aspart  0-6 Units Subcutaneous TID WC  . insulin glargine  8 Units Subcutaneous Daily  . iron polysaccharides  150 mg Oral Daily  . melatonin  5 mg Oral QHS  . multivitamin  1 tablet Oral Daily  . sacubitril-valsartan  1 tablet Oral BID  . sertraline  100 mg Oral Daily  . sodium chloride flush  3 mL Intravenous Q12H   Continuous Infusions: . sodium chloride    . sodium chloride    . sodium chloride       LOS: 2 days   Time spent= 35 mins    Heide Brossart Arsenio Loader, MD [facility]  If 7PM-7AM, please contact night-coverage  01/11/2020, 2:07 PM

## 2020-01-11 NOTE — Progress Notes (Signed)
PT Cancellation Note  Patient Details Name: [name] MRN: [id number] DOB: 1961-05-11   Cancelled Treatment:     PT orders received and chart reviewed. Pt's last hgb reading was 6.8 which is below the therapeutic threshold for physical activity. Will hold at this time until pt medically stable to participate. Will continue to follow and attempt another date/time when appropriate.  Vale Haven 01/11/2020, 9:18 AM

## 2020-01-11 NOTE — Evaluation (Signed)
Occupational Therapy Evaluation Patient Details Name: [name] MRN: [id number] DOB: 23-Mar-1961 Today's Date: 01/11/2020    History of Present Illness Pt is a 59 y/o M with PMH: ESRD on HD TTS, chronic hypoxic respiratory failure, DM with multiple hyerglycemia admissions, multiple CVA, HTN, ETOH abuse, 2nd toe amputation (12/11/19). Pt recently d/c'ed 8/23 with 4 L of oxygen and at that time had been admitted for anemia and GI work-up and received a blood transfusion. On this admission, pt presented SOB following HD. Pt req'ed 6Lnc at the time. CXR w/ pulmonary edema. Pt adm for acute on chronic respiratory failure with hypoxemia.   Clinical Impression   Pt was seen for OT evaluation this date. Prior to hospital admission, pt was requiring some assist with self care ADLs from his spouse and [facility] aide and pt reports being SBA/CGA for fxl mobility with RW. Pt lives in [place] [place] [place] [facility] with spouse with level entry. Currently pt demonstrates impairments as described below (See OT problem list) which functionally limit his ability to perform ADL/self-care tasks. Pt currently requires CGA/MIN A for in-bed rolling and declines other OOB activity/mobilization this date citing fatigue. In addition, pt requiring setup to MIN A with bed level UB ADLs and MAX to TOTAL A with bed level LB ADLs.  Pt would benefit from skilled OT to address noted impairments and functional limitations (see below for any additional details) in order to maximize safety and independence while minimizing falls risk and caregiver burden. Upon hospital discharge, while pt could benefit from rehab, he expressed that he wants to go home and has good support in place between spouse and aides. Anticipate HHOT upon d/c from acute setting will be most appropriate at this time. Will continue to follow.     Follow Up Recommendations  Home health OT;Supervision/Assistance - 24 hour    Equipment Recommendations  3 in 1 bedside commode    Recommendations for  Other Services       Precautions / Restrictions Precautions Precautions: Fall Other Brace: had post-op shoe from a previous admission (toe amp that occurred 12/11/19). Pt is likely still in window of wear time. Restrictions Weight Bearing Restrictions: No LLE Weight Bearing: Weight bearing as tolerated LLE Partial Weight Bearing Percentage or Pounds: Per previous PT note, order states pt was heel WB in post-op shoe.      Mobility Bed Mobility Overal bed mobility: Needs Assistance Bed Mobility: Rolling Rolling: Min guard;Min assist         General bed mobility comments: use of bed rails. Pt is also good at bridging (SBA/SUPV level)  Transfers                 General transfer comment: NT, pt declines citing fatigue    Balance                                           ADL either performed or assessed with clinical judgement   ADL Overall ADL's : Needs assistance/impaired                                       General ADL Comments: setup to MIN A for bed level UB ADLs including self-feeding to setup tray/open condiments. MAX to TOTAL A for bed level LB ADLs.     Vision  Patient Visual Report: No change from baseline Additional Comments: pt reports cataracts in b/l eyes, states he is supposed to wear glasses, but doesn't.     Perception     Praxis      Pertinent Vitals/Pain Pain Assessment: No/denies pain     Hand Dominance Left   Extremity/Trunk Assessment Upper Extremity Assessment Upper Extremity Assessment:  (R grossly 3/5, L grossly 4-/5)   Lower Extremity Assessment Lower Extremity Assessment: Defer to PT evaluation;Generalized weakness       Communication Communication Communication: No difficulties   Cognition Arousal/Alertness: Awake/alert Behavior During Therapy: WFL for tasks assessed/performed Overall Cognitive Status: Impaired/Different from baseline                                  General Comments: Somewhat confused, some increased processing time. Oriented to self and place as well as year, but not month/date   General Comments       Exercises Other Exercises Other Exercises: OT facilittes ed re: importance of OOB activity to maintain strength, prevent skin breakdown and prevent PNA. Pt agreeable to education, but not agreeable to sitting up today citing fatigue. Other Exercises: OT engages pt in grasp squeezes using wash cloth for light resistance for 1 set x10 reps B'ly.   Shoulder Instructions      Home Living Family/patient expects to be discharged to:: Private residence Living Arrangements: Spouse/significant other Available Help at Discharge: Family;Available PRN/intermittently Type of Home: House Home Access: Level entry     Home Layout: One level     Bathroom Shower/Tub: Occupational psychologist: Standard Bathroom Accessibility: Yes   Home Equipment: Grab bars - tub/shower;Grab bars - toilet;Cane - single point;Walker - 2 wheels   Additional Comments: pt somewhat confused, increased processing time. questionable historian, some PLOF pulled from previous therapy evaluations. Somewhat conflicting information as pt reports still being able to walk with walker, but then describes needing assist.      Prior Functioning/Environment Level of Independence: Needs assistance  Gait / Transfers Assistance Needed: Pt describes being able to perform fxl mobility within the home with RW for short distances with supervision to some light physical assist from spouse. ADL's / Homemaking Assistance Needed: Pt reports that he has aide 5hrs/day who assists with bathing and dressing. States that his spouse assists with HH IADLs, meal setup, and toileting. States that his daughter or spouse trade off driving pt to appts.   Comments: Pt reports that he has been progressively requiring increased assist from spouse and caregivers over ~1-2 months now, coinciding  with several hospitalizations.  Endorses >5 falls in a year.        OT Problem List: Decreased strength;Decreased range of motion;Decreased activity tolerance;Decreased safety awareness;Impaired UE functional use;Cardiopulmonary status limiting activity      OT Treatment/Interventions: Self-care/ADL training;Therapeutic exercise;Energy conservation;DME and/or AE instruction;Therapeutic activities;Patient/family education;Balance training    OT Goals(Current goals can be found in the care plan section) Acute Rehab OT Goals Patient Stated Goal: to return home OT Goal Formulation: With patient Time For Goal Achievement: 01/25/20 Potential to Achieve Goals: Good ADL Goals Pt Will Perform Upper Body Dressing: with supervision;sitting Pt Will Perform Lower Body Dressing: with min assist;sitting/lateral leans (with AE PRN) Pt Will Transfer to Toilet: with min assist;stand pivot transfer;bedside commode Pt Will Perform Toileting - Clothing Manipulation and hygiene: sit to/from stand;with min assist Pt/caregiver will Perform Home Exercise Program: Increased strength;Both right  and left upper extremity;With minimal assist  OT Frequency: Min 1X/week   Barriers to D/C: Inaccessible home environment;Decreased caregiver support          Co-evaluation              AM-PAC OT "6 Clicks" Daily Activity     Outcome Measure Help from another person eating meals?: A Little Help from another person taking care of personal grooming?: A Little Help from another person toileting, which includes using toliet, bedpan, or urinal?: A Lot Help from another person bathing (including washing, rinsing, drying)?: A Lot Help from another person to put on and taking off regular upper body clothing?: A Little Help from another person to put on and taking off regular lower body clothing?: A Lot 6 Click Score: 15   End of Session Nurse Communication: Mobility status  Activity Tolerance: Patient limited by  fatigue Patient left: in bed;with call bell/phone within reach;with bed alarm set;with nursing/sitter in room  OT Visit Diagnosis: Other abnormalities of gait and mobility (R26.89);Muscle weakness (generalized) (M62.81)                Time: 3244-0102 OT Time Calculation (min): 38 min Charges:  OT General Charges $OT Visit: 1 Visit OT Evaluation $OT Eval Moderate Complexity: 1 Mod OT Treatments $Self Care/Home Management : 8-22 mins $Therapeutic Activity: 8-22 mins  Gerrianne Scale, MS, OTR/L ascom [phone] 01/11/20, 2:53 PM

## 2020-01-12 LAB — CBC
HCT: 20.6 % — ABNORMAL LOW (ref 39.0–52.0)
HCT: 22.6 % — ABNORMAL LOW (ref 39.0–52.0)
Hemoglobin: 6.6 g/dL — ABNORMAL LOW (ref 13.0–17.0)
Hemoglobin: 7.1 g/dL — ABNORMAL LOW (ref 13.0–17.0)
MCH: 30.8 pg (ref 26.0–34.0)
MCH: 30.3 pg (ref 26.0–34.0)
MCHC: 32 g/dL (ref 30.0–36.0)
MCHC: 31.4 g/dL (ref 30.0–36.0)
MCV: 96.3 fL (ref 80.0–100.0)
MCV: 96.6 fL (ref 80.0–100.0)
Platelets: 208 10*3/uL (ref 150–400)
Platelets: 204 10*3/uL (ref 150–400)
RBC: 2.14 MIL/uL — ABNORMAL LOW (ref 4.22–5.81)
RBC: 2.34 MIL/uL — ABNORMAL LOW (ref 4.22–5.81)
RDW: 15.7 % — ABNORMAL HIGH (ref 11.5–15.5)
RDW: 15.9 % — ABNORMAL HIGH (ref 11.5–15.5)
WBC: 5.9 10*3/uL (ref 4.0–10.5)
WBC: 5.8 10*3/uL (ref 4.0–10.5)
nRBC: 0 % (ref 0.0–0.2)
nRBC: 0 % (ref 0.0–0.2)

## 2020-01-12 LAB — PREPARE RBC (CROSSMATCH)

## 2020-01-12 LAB — GLUCOSE, CAPILLARY
Glucose-Capillary: 190 mg/dL — ABNORMAL HIGH (ref 70–99)
Glucose-Capillary: 175 mg/dL — ABNORMAL HIGH (ref 70–99)
Glucose-Capillary: 217 mg/dL — ABNORMAL HIGH (ref 70–99)
Glucose-Capillary: 256 mg/dL — ABNORMAL HIGH (ref 70–99)

## 2020-01-12 LAB — HEPATITIS B SURFACE ANTIBODY, QUANTITATIVE: Hep B S AB Quant (Post): 3.8 m[IU]/mL — ABNORMAL LOW (ref >9.9)

## 2020-01-12 LAB — BASIC METABOLIC PANEL
Anion gap: 13 (ref 5–15)
BUN: 44 mg/dL — ABNORMAL HIGH (ref 6–20)
CO2: 25 mmol/L (ref 22–32)
Calcium: 7.7 mg/dL — ABNORMAL LOW (ref 8.9–10.3)
Chloride: 99 mmol/L (ref 98–111)
Creatinine, Ser: 4.64 mg/dL — ABNORMAL HIGH (ref 0.61–1.24)
GFR calc Af Amer: 15 mL/min — ABNORMAL LOW (ref >60)
GFR calc non Af Amer: 13 mL/min — ABNORMAL LOW (ref >60)
Glucose, Bld: 201 mg/dL — ABNORMAL HIGH (ref 70–99)
Potassium: 3.8 mmol/L (ref 3.5–5.1)
Sodium: 137 mmol/L (ref 135–145)

## 2020-01-12 LAB — MAGNESIUM: Magnesium: 2 mg/dL (ref 1.7–2.4)

## 2020-01-12 LAB — MRSA PCR SCREENING: MRSA by PCR: NEGATIVE

## 2020-01-12 MED ORDER — SODIUM CHLORIDE 0.9% IV SOLUTION: Freq: Once | INTRAVENOUS | Status: DC

## 2020-01-12 NOTE — Progress Notes (Signed)
[facility]  ROUNDING NOTE   Subjective:   Patient was seen in dialysis today, tolerating treatment well. No acute distress noted,receiving PRBC transfusion.  Objective:  Vital signs in last 24 hours:  Temp:  [97.7 F (36.5 C)-98.5 F (36.9 C)] 98 F (36.7 C) (08/31 1245) Pulse Rate:  [78-88] 81 (08/31 1130) Resp:  [13-23] 20 (08/31 1345) BP: (118-148)/(66-80) 148/71 (08/31 1315) SpO2:  [93 %-97 %] 93 % (08/31 0755) Weight:  [62.2 kg] 62.2 kg (08/31 0343)  Weight change: 0.998 kg Filed Weights   01/09/20 0805 01/11/20 0353 01/12/20 0343  Weight: 61.2 kg 61.2 kg 62.2 kg    Intake/Output: I/O last 3 completed shifts: In: 600 [P.O.:600] Out: -    Intake/Output this shift:  Total I/O In: 240 [P.O.:240] Out: -   Physical Exam: General: Resting in bed, receiving dialysis  Head: Oral mucosal membranes moist  Eyes: Sclerae and conjunctivae clear  Neck: Trachea midline,ROM intact  Lungs:  Normal and symmetrical respiratory effort,Lungs clear  Heart: S1S2, No rubs or gallop,regular  Abdomen:  Non distended, non tender  Extremities:  No peripheral edema.  Neurologic: Awake, alert, oriented  Skin: No acute lesions or rashes noted  Access: RIJ permcath    Basic Metabolic Panel: Recent Labs  Lab 01/09/20 0840 01/09/20 0840 01/09/20 1507 01/10/20 0144 01/11/20 0504 01/12/20 0400  NA 131*  --   --  137 141 137  K 3.6  --   --  3.2* 3.7 3.8  CL 96*  --   --  97* 103 99  CO2 26  --   --  27 30 25   GLUCOSE 465*  --   --  164* 192* 201*  BUN 39*  --   --  21* 34* 44*  CREATININE 3.29*  --  2.19* 2.31* 3.78* 4.64*  CALCIUM 8.2*   < >  --  8.0* 8.1* 7.7*  MG  --   --   --   --   --  2.0   < > = values in this interval not displayed.    Liver Function Tests: No results for input(s): AST, ALT, ALKPHOS, BILITOT, PROT, ALBUMIN in the last 168 hours. No results for input(s): LIPASE, AMYLASE in the last 168 hours. No results for input(s): AMMONIA in the last  168 hours.  CBC: Recent Labs  Lab 01/09/20 0840 01/09/20 0840 01/09/20 1507 01/10/20 0642 01/11/20 0504 01/12/20 0400 01/12/20 0929  WBC 6.1  --  5.0  --  4.9 5.9 5.8  NEUTROABS 5.0  --   --   --   --   --   --   HGB 7.4*   < > 6.7* 6.8* 7.0* 6.6* 7.1*  HCT 23.1*   < > 20.7* 21.8* 22.7* 20.6* 22.6*  MCV 96.3  --  93.7  --  97.8 96.3 96.6  PLT 147*  --  144*  --  172 208 204   < > = values in this interval not displayed.    Cardiac Enzymes: No results for input(s): CKTOTAL, CKMB, CKMBINDEX, TROPONINI in the last 168 hours.  BNP: Invalid input(s): POCBNP  CBG: Recent Labs  Lab 01/11/20 0746 01/11/20 1139 01/11/20 1803 01/11/20 2040 01/12/20 0756  GLUCAP 161* 314* 374* 189* 190*    Microbiology: Results for orders placed or performed during the hospital encounter of 01/09/20  SARS Coronavirus 2 by RT PCR (hospital order, performed in [facility] hospital lab) Nasopharyngeal Nasopharyngeal Swab     Status: None  Collection Time: 01/09/20  8:40 AM   Specimen: Nasopharyngeal Swab  Result Value Ref Range Status   SARS Coronavirus 2 NEGATIVE NEGATIVE Final    Comment: (NOTE) SARS-CoV-2 target nucleic acids are NOT DETECTED.  The SARS-CoV-2 RNA is generally detectable in upper and lower respiratory specimens during the acute phase of infection. The lowest concentration of SARS-CoV-2 viral copies this assay can detect is 250 copies / mL. A negative result does not preclude SARS-CoV-2 infection and should not be used as the sole basis for treatment or other patient management decisions.  A negative result may occur with improper specimen collection / handling, submission of specimen other than nasopharyngeal swab, presence of viral mutation(s) within the areas targeted by this assay, and inadequate number of viral copies (<250 copies / mL). A negative result must be combined with clinical observations, patient history, and epidemiological information.  Fact Sheet for  Patients:   StrictlyIdeas.no  Fact Sheet for Healthcare Providers: BankingDealers.co.za  This test is not yet approved or  cleared by the [place] FDA and has been authorized for detection and/or diagnosis of SARS-CoV-2 by FDA under an Emergency Use Authorization (EUA).  This EUA will remain in effect (meaning this test can be used) for the duration of the COVID-19 declaration under Section 564(b)(1) of the Act, 21 U.S.C. section 360bbb-3(b)(1), unless the authorization is terminated or revoked sooner.  Performed at [facility], [address] [place], [place] [address]   MRSA PCR Screening     Status: None   Collection Time: 01/12/20  5:25 AM   Specimen: Nasopharyngeal  Result Value Ref Range Status   MRSA by PCR NEGATIVE NEGATIVE Final    Comment:        The GeneXpert MRSA Assay (FDA approved for NASAL specimens only), is one component of a comprehensive MRSA colonization surveillance program. It is not intended to diagnose MRSA infection nor to guide or monitor treatment for MRSA infections. Performed at [facility], [address] [place], [place] [address]     Coagulation Studies: No results for input(s): LABPROT, INR in the last 72 hours.  Urinalysis: No results for input(s): COLORURINE, LABSPEC, PHURINE, GLUCOSEU, HGBUR, BILIRUBINUR, KETONESUR, PROTEINUR, UROBILINOGEN, NITRITE, LEUKOCYTESUR in the last 72 hours.  Invalid input(s): APPERANCEUR    Imaging: No results found.   Medications:    sodium chloride     sodium chloride     sodium chloride      sodium chloride   Intravenous Once   aspirin EC  81 mg Oral Daily   carvedilol  6.25 mg Oral BID WC   Chlorhexidine Gluconate Cloth  6 each Topical Q0600   epoetin (EPOGEN/PROCRIT) injection  10,000 Units Subcutaneous Q T,Th,Sa-HD   feeding supplement (NEPRO CARB STEADY)  237 mL Oral TID BM   folic acid  1 mg Oral Daily    hydrALAZINE  25 mg Oral Q8H   insulin aspart  0-6 Units Subcutaneous TID WC   insulin glargine  8 Units Subcutaneous Daily   iron polysaccharides  150 mg Oral Daily   melatonin  5 mg Oral QHS   multivitamin  1 tablet Oral Daily   sacubitril-valsartan  1 tablet Oral BID   sertraline  100 mg Oral Daily   sodium chloride flush  3 mL Intravenous Q12H   thiamine  100 mg Oral Daily   sodium chloride, sodium chloride, sodium chloride, acetaminophen, alteplase, heparin, heparin, lidocaine (PF), lidocaine-prilocaine, ondansetron (ZOFRAN) IV, pentafluoroprop-tetrafluoroeth, sodium chloride flush  Assessment/  Plan:  Mr. PETERSON MATHEY is a 59 y.o. black male with end stage renal disease on hemodialysis, diabetes mellitus type 2, hypertension, history of CVA, hyperlipidemia, history of alcohol abuse who was admitted on 01/09/2020 for ESRD on dialysis [facility]) [N18.6, Z99.2] Acute on chronic respiratory failure with hypoxia ([place]) [J96.21] Acute on chronic respiratory failure with hypoxemia ([place]) [J96.21] Acute on chronic congestive heart failure, unspecified heart failure type ([place]) [I50.9]  [place]. RIJ permcath 61.5kg   1. End Stage Renal Disease:  Dialysis today,tolerating well.  Will continue TTS schedule  2. Hypertension:Controlled with current medications   3. Anemia of chronic kidney disease:  Concern for GI bleed. Baseline hemoglobin 8. Hgb today was 6.6, getting PRBC during dialysis. Will continue Epo with dialysis  4. Secondary Hyperparathyroidism: not currently on a binder.   5. Hypokalemia K+ within normal range, 3.8 today  Patient was seen and examined with Crosby Oyster, DNP. Patient was seen and examined on hemodialysis treatment. Above plan was discussed and agreed upon on the signing of this note.   Lavonia Dana , [facility]  8/31/20214:08 PM     LOS: 3 Lavonia Dana 8/31/20212:04 PM

## 2020-01-12 NOTE — Progress Notes (Signed)
OT Cancellation Note  Patient Details Name: [name] MRN: [id number] DOB: February 15, 1961   Cancelled Treatment:    Reason Eval/Treat Not Completed: Patient at procedure or test/ unavailable  Pt OTF at HD at this time. Will f/u for OT treatment at later date/time as able. Thank you.  Gerrianne Scale, [place], OTR/L ascom [phone] 01/12/20, 12:08 PM

## 2020-01-12 NOTE — Progress Notes (Signed)
Mobility Specialist - Progress Note   01/12/20 1354  Mobility  Activity Refused mobility (Cancelled)  Mobility performed by Mobility specialist    Pt currently undergoing HD treatment. Will attempt session at another time.    Kathee Delton Mobility Specialist 01/12/20, 1:55 PM

## 2020-01-12 NOTE — Progress Notes (Signed)
PROGRESS NOTE    [name]  QBH:[id number] DOB: Apr 23, 1961 DOA: 01/09/2020 PCP: [name], MD   Brief Narrative:  59 year old male past medical history of end-stage renal disease on hemodialysis, chronic systolic heart failure with severe pulmonary hypertension and mitral regurg, diabetes and alcohol abuse came to the emergency room on 8/28 after hemodialysis and found to be short winded.  He has been recently discharged I & D 8/23 with 4 L of oxygen and at that time had been admitted for anemia and GI work-up and received a blood transfusion. In the emergency room, he was noted to be hypoxic requiring 6 L.  Chest x-ray noted pulmonary edema.  Admitted to the hospitalist service.  Seen by nephrology and taken for further dialysis.  Per wife patient has been very noncompliant with p.o. intake.  Assessment & Plan:   Principal Problem:   Acute on chronic respiratory failure with hypoxemia (HCC) Active Problems:   Uncontrolled diabetes mellitus (HCC)   Alcohol abuse   Tobacco abuse   HTN (hypertension)   Protein-calorie malnutrition, severe   ESRD on dialysis ([place])   DM (diabetes mellitus), type 2 with complications (HCC)   Acute on chronic systolic CHF (congestive heart failure) (HCC)   Severe mitral regurgitation   Acute on hypoxic respiratory failure on 2 L nasal cannula now 1 6 L nasal cannula Volume overloaded -Secondary to volume overload with mild anemia.  Plans for hemodialysis today, out of bed to chair, incentive spirometer.  We will continue to wean him off oxygen as appropriate.   ESRD on hemodialysis -Nephrology team following, plans for dialysis today.  Chronic systolic congestive heart failure, EF 45%, class III -Currently on supplemental oxygen.  Supportive care.  Plans for dialysis today  Anemia of chronic disease -Baseline hemoglobin 8.0.  Admission hemoglobin 6.8 and this morning 7.0.  Hemoglobin today 6.6, 1 unit PRBC to be transfused during  hemodialysis. -Endoscopy 01/02/20-normal.  Colonoscopy October 2020-multiple polyps and hemorrhoids which were removed.  PT/OT-recommending home health Actually worry about his medication and dietary noncompliance.  I have told his wife that it can lead to recurrent readmissions for same reasons.   DVT prophylaxis: SCDs Start: 01/09/20 0973 Code Status:  Family Communication: Wife updated.   Status is: Inpatient  Remains inpatient appropriate because:Inpatient level of care appropriate due to severity of illness   Dispo: The patient is from: Home              Anticipated d/c is to: Home              Anticipated d/c date is: 2 days              Patient currently is not medically stable to d/c.  Plans for hemodialysis for volume removal today, wean off oxygen.  In the meantime receiving 1 unit PRBC transfusion.  Once his volume status and hemoglobin stabilizes he can be discharged.      Body mass index is 19.12 kg/m.      Subjective: Seen in HD, no new complaints. Still on 4 l [place].   Review of Systems Otherwise negative except as per HPI, including: General = no fevers, chills, dizziness,  fatigue HEENT/EYES = negative for loss of vision, double vision, blurred vision,  sore throa Cardiovascular= negative for chest pain, palpitation Respiratory/lungs= negative for shortness of breath, cough, wheezing; hemoptysis,  Gastrointestinal= negative for nausea, vomiting, abdominal pain Genitourinary= negative for Dysuria MSK = Negative for arthralgia, myalgias Neurology= Negative for headache,  numbness, tingling  Psychiatry= Negative for suicidal and homocidal ideation Skin= Negative for Rash   Examination: Constitutional: Not in acute distress; 4L [place] Respiratory: b/l bibasilar crackles.  Cardiovascular: Normal sinus rhythm, no rubs Abdomen: Nontender nondistended good bowel sounds Musculoskeletal: No edema noted Skin: No rashes seen Neurologic: CN 2-12 grossly intact.  And  nonfocal Psychiatric: Poorjudgment and insight. Alert and oriented x 3. Normal mood.  Right IJ in place.   Objective: Vitals:   01/11/20 1649 01/11/20 2000 01/12/20 0343 01/12/20 0755  BP:  118/70 (!) 145/80 (!) 143/80  Pulse:   78 85  Resp:  15 16 19   Temp:  98.5 F (36.9 C) 98 F (36.7 C) 97.8 F (36.6 C)  TempSrc:  Oral Oral Oral  SpO2: 94%  97% 93%  Weight:   62.2 kg   Height:        Intake/Output Summary (Last 24 hours) at 01/12/2020 0909 Last data filed at 01/11/2020 1955 Gross per 24 hour  Intake 480 ml  Output --  Net 480 ml   Filed Weights   01/09/20 0805 01/11/20 0353 01/12/20 0343  Weight: 61.2 kg 61.2 kg 62.2 kg     Data Reviewed:   CBC: Recent Labs  Lab 01/09/20 0840 01/09/20 1507 01/10/20 0642 01/11/20 0504 01/12/20 0400  WBC 6.1 5.0  --  4.9 5.9  NEUTROABS 5.0  --   --   --   --   HGB 7.4* 6.7* 6.8* 7.0* 6.6*  HCT 23.1* 20.7* 21.8* 22.7* 20.6*  MCV 96.3 93.7  --  97.8 96.3  PLT 147* 144*  --  172 539   Basic Metabolic Panel: Recent Labs  Lab 01/09/20 0840 01/09/20 1507 01/10/20 0144 01/11/20 0504 01/12/20 0400  NA 131*  --  137 141 137  K 3.6  --  3.2* 3.7 3.8  CL 96*  --  97* 103 99  CO2 26  --  27 30 25   GLUCOSE 465*  --  164* 192* 201*  BUN 39*  --  21* 34* 44*  CREATININE 3.29* 2.19* 2.31* 3.78* 4.64*  CALCIUM 8.2*  --  8.0* 8.1* 7.7*  MG  --   --   --   --  2.0   GFR: Estimated Creatinine Clearance: 14.9 mL/min (A) (by C-G formula based on SCr of 4.64 mg/dL (H)). Liver Function Tests: No results for input(s): AST, ALT, ALKPHOS, BILITOT, PROT, ALBUMIN in the last 168 hours. No results for input(s): LIPASE, AMYLASE in the last 168 hours. No results for input(s): AMMONIA in the last 168 hours. Coagulation Profile: No results for input(s): INR, PROTIME in the last 168 hours. Cardiac Enzymes: No results for input(s): CKTOTAL, CKMB, CKMBINDEX, TROPONINI in the last 168 hours. BNP (last 3 results) No results for input(s):  PROBNP in the last 8760 hours. HbA1C: No results for input(s): HGBA1C in the last 72 hours. CBG: Recent Labs  Lab 01/11/20 0746 01/11/20 1139 01/11/20 1803 01/11/20 2040 01/12/20 0756  GLUCAP 161* 314* 374* 189* 190*   Lipid Profile: No results for input(s): CHOL, HDL, LDLCALC, TRIG, CHOLHDL, LDLDIRECT in the last 72 hours. Thyroid Function Tests: No results for input(s): TSH, T4TOTAL, FREET4, T3FREE, THYROIDAB in the last 72 hours. Anemia Panel: No results for input(s): VITAMINB12, FOLATE, FERRITIN, TIBC, IRON, RETICCTPCT in the last 72 hours. Sepsis Labs: No results for input(s): PROCALCITON, LATICACIDVEN in the last 168 hours.  Recent Results (from the past 240 hour(s))  SARS Coronavirus 2 by RT PCR (hospital order,  performed in [facility] hospital lab) Nasopharyngeal Nasopharyngeal Swab     Status: None   Collection Time: 01/09/20  8:40 AM   Specimen: Nasopharyngeal Swab  Result Value Ref Range Status   SARS Coronavirus 2 NEGATIVE NEGATIVE Final    Comment: (NOTE) SARS-CoV-2 target nucleic acids are NOT DETECTED.  The SARS-CoV-2 RNA is generally detectable in upper and lower respiratory specimens during the acute phase of infection. The lowest concentration of SARS-CoV-2 viral copies this assay can detect is 250 copies / mL. A negative result does not preclude SARS-CoV-2 infection and should not be used as the sole basis for treatment or other patient management decisions.  A negative result may occur with improper specimen collection / handling, submission of specimen other than nasopharyngeal swab, presence of viral mutation(s) within the areas targeted by this assay, and inadequate number of viral copies (<250 copies / mL). A negative result must be combined with clinical observations, patient history, and epidemiological information.  Fact Sheet for Patients:   StrictlyIdeas.no  Fact Sheet for Healthcare  Providers: BankingDealers.co.za  This test is not yet approved or  cleared by the [place] FDA and has been authorized for detection and/or diagnosis of SARS-CoV-2 by FDA under an Emergency Use Authorization (EUA).  This EUA will remain in effect (meaning this test can be used) for the duration of the COVID-19 declaration under Section 564(b)(1) of the Act, 21 U.S.C. section 360bbb-3(b)(1), unless the authorization is terminated or revoked sooner.  Performed at [facility], [address] [place], [place] [address]   MRSA PCR Screening     Status: None   Collection Time: 01/12/20  5:25 AM   Specimen: Nasopharyngeal  Result Value Ref Range Status   MRSA by PCR NEGATIVE NEGATIVE Final    Comment:        The GeneXpert MRSA Assay (FDA approved for NASAL specimens only), is one component of a comprehensive MRSA colonization surveillance program. It is not intended to diagnose MRSA infection nor to guide or monitor treatment for MRSA infections. Performed at [facility], [address], [place],  [address]          Radiology Studies: No results found.      Scheduled Meds: . sodium chloride   Intravenous Once  . aspirin EC  81 mg Oral Daily  . carvedilol  6.25 mg Oral BID WC  . Chlorhexidine Gluconate Cloth  6 each Topical Q0600  . epoetin (EPOGEN/PROCRIT) injection  10,000 Units Subcutaneous Q T,Th,Sa-HD  . feeding supplement (NEPRO CARB STEADY)  237 mL Oral TID BM  . folic acid  1 mg Oral Daily  . heparin  5,000 Units Subcutaneous Q8H  . hydrALAZINE  25 mg Oral Q8H  . insulin aspart  0-6 Units Subcutaneous TID WC  . insulin glargine  8 Units Subcutaneous Daily  . iron polysaccharides  150 mg Oral Daily  . melatonin  5 mg Oral QHS  . multivitamin  1 tablet Oral Daily  . sacubitril-valsartan  1 tablet Oral BID  . sertraline  100 mg Oral Daily  . sodium chloride flush  3 mL Intravenous Q12H  . thiamine  100  mg Oral Daily   Continuous Infusions: . sodium chloride    . sodium chloride    . sodium chloride       LOS: 3 days   Time spent= 35 mins    Diann Bangerter Arsenio Loader, MD [facility]  If 7PM-7AM, please contact night-coverage  01/12/2020, 9:09  AM  

## 2020-01-12 NOTE — Care Management Important Message (Signed)
Important Message  Patient Details  Name: [name] MRN: [id number] Date of Birth: [birth year]   Medicare Important Message Given:  Yes     Dannette Barbara 01/12/2020, 11:55 AM

## 2020-01-12 NOTE — Progress Notes (Signed)
PT Cancellation Note  Patient Details Name: [name] MRN: [id number] DOB: 1960-10-24   Cancelled Treatment:    Reason Eval/Treat Not Completed: Other (comment). Currently at HD at this time, not available for therapy at this time.   [name] 01/12/2020, 12:41 PM  Greggory Stallion, PT, DPT [phone]

## 2020-01-12 NOTE — Progress Notes (Signed)
Mobility Specialist - Progress Note   01/12/20 1647  Mobility  Activity Refused mobility (Cancelled)  Mobility performed by Mobility specialist    Pt was in the bathroom with nurse present in room upon arrival. Mobility attempted session, but per discussion with nurse, pt had an incontinent episode and is feeling really winded after getting back into bed from hygiene practice. Will attempt session at another date.    Kathee Delton Mobility Specialist 01/12/20, 4:50 PM

## 2020-01-12 NOTE — Progress Notes (Signed)
Inpatient Diabetes Program Recommendations  AACE/ADA: New Consensus Statement on Inpatient Glycemic Control   Target Ranges:  Prepandial:   less than 140 mg/dL      Peak postprandial:   less than 180 mg/dL (1-2 hours)      Critically ill patients:  140 - 180 mg/dL  Results for FREDY, GLADU (MRN [id number]) as of 01/12/2020 10:45  Ref. Range 01/11/2020 07:46 01/11/2020 11:39 01/11/2020 18:03 01/11/2020 20:40 01/12/2020 07:56  Glucose-Capillary Latest Ref Range: 70 - 99 mg/dL 161 (H) 314 (H) 374 (H) 189 (H) 190 (H)    Review of Glycemic Control  Diabetes history: DM2 Outpatient Diabetes medications: Lantus 4 units daily, Humalog 3 units TID with meals Current orders for Inpatient glycemic control: Lantus 8 units daily, Novolog 0-6 units TID with meals  Inpatient Diabetes Program Recommendations:    Insulin: If post prandial glucose continues to be greater than 180 mg/dl, please consider ordering Novolog 2 units TID with meals for meal coverage if patient eats at least 50% of meals.  Thanks, Barnie Alderman, RN, MSN, CDE Diabetes Coordinator Inpatient Diabetes Program [phone] (Team Pager from 8am to 5pm)

## 2020-01-12 NOTE — Progress Notes (Signed)
This note also relates to the following rows which could not be included: Resp - Cannot attach notes to unvalidated device data BP - Cannot attach notes to unvalidated device data  Hd completed  

## 2020-01-13 ENCOUNTER — Encounter: Payer: Self-pay | Admitting: Internal Medicine

## 2020-01-13 DIAGNOSIS — I5023 Acute on chronic systolic (congestive) heart failure: Secondary | ICD-10-CM

## 2020-01-13 LAB — MAGNESIUM: Magnesium: 1.8 mg/dL (ref 1.7–2.4)

## 2020-01-13 LAB — BPAM RBC
Blood Product Expiration Date: 202109242359
ISSUE DATE / TIME: 202108311147
Unit Type and Rh: 6200

## 2020-01-13 LAB — GLUCOSE, CAPILLARY
Glucose-Capillary: 374 mg/dL — ABNORMAL HIGH (ref 70–99)
Glucose-Capillary: 335 mg/dL — ABNORMAL HIGH (ref 70–99)

## 2020-01-13 LAB — BASIC METABOLIC PANEL
Anion gap: 9 (ref 5–15)
BUN: 25 mg/dL — ABNORMAL HIGH (ref 6–20)
CO2: 28 mmol/L (ref 22–32)
Calcium: 7.9 mg/dL — ABNORMAL LOW (ref 8.9–10.3)
Chloride: 99 mmol/L (ref 98–111)
Creatinine, Ser: 3.38 mg/dL — ABNORMAL HIGH (ref 0.61–1.24)
GFR calc Af Amer: 22 mL/min — ABNORMAL LOW (ref >60)
GFR calc non Af Amer: 19 mL/min — ABNORMAL LOW (ref >60)
Glucose, Bld: 218 mg/dL — ABNORMAL HIGH (ref 70–99)
Potassium: 3.8 mmol/L (ref 3.5–5.1)
Sodium: 136 mmol/L (ref 135–145)

## 2020-01-13 LAB — CBC
HCT: 35.7 % — ABNORMAL LOW (ref 39.0–52.0)
Hemoglobin: 11.6 g/dL — ABNORMAL LOW (ref 13.0–17.0)
MCH: 30.5 pg (ref 26.0–34.0)
MCHC: 32.5 g/dL (ref 30.0–36.0)
MCV: 93.9 fL (ref 80.0–100.0)
Platelets: 149 K/uL — ABNORMAL LOW (ref 150–400)
RBC: 3.8 MIL/uL — ABNORMAL LOW (ref 4.22–5.81)
RDW: 16.7 % — ABNORMAL HIGH (ref 11.5–15.5)
WBC: 3 K/uL — ABNORMAL LOW (ref 4.0–10.5)
nRBC: 0 % (ref 0.0–0.2)

## 2020-01-13 LAB — TYPE AND SCREEN
ABO/RH(D): A POS
Antibody Screen: NEGATIVE
Unit division: 0

## 2020-01-13 MED ORDER — HYDRALAZINE HCL 25 MG PO TABS
25.0000 mg | ORAL_TABLET | Freq: Two times a day (BID) | ORAL | Status: AC
Start: 2020-01-13 — End: ?

## 2020-01-13 MED ORDER — INSULIN ASPART 100 UNIT/ML ~~LOC~~ SOLN
2.0000 [IU] | Freq: Three times a day (TID) | SUBCUTANEOUS | Status: DC
  Administered 2020-01-13: 2 [IU] via SUBCUTANEOUS
  Filled 2020-01-13: qty 1

## 2020-01-13 NOTE — Progress Notes (Signed)
[facility]  ROUNDING NOTE   Subjective:   Hemodialysis treatment yesterday. Tolerated treatment well. UF of 2 liters.   PRBC transfusion yesterday.   Patient states his shortness of breath and weakness have improved.   Objective:  Vital signs in last 24 hours:  Temp:  [97.2 F (36.2 C)-98.6 F (37 C)] 98 F (36.7 C) (09/01 1124) Pulse Rate:  [84-98] 86 (09/01 1124) Resp:  [18-23] 18 (09/01 1124) BP: (133-153)/(68-83) 138/76 (09/01 1124) SpO2:  [86 %-99 %] 90 % (09/01 1124)  Weight change:  Filed Weights   01/09/20 0805 01/11/20 0353 01/12/20 0343  Weight: 61.2 kg 61.2 kg 62.2 kg    Intake/Output: I/O last 3 completed shifts: In: 24 [P.O.:720] Out: 2000 [Other:2000]   Intake/Output this shift:  Total I/O In: 240 [P.O.:240] Out: -   Physical Exam: General: Sitting in chair  Head: Oral mucosal membranes moist  Eyes: Sclerae and conjunctivae clear  Neck: Trachea midline,ROM intact  Lungs:  Clear on ausculation bilaterally  Heart: regular  Abdomen:  Non distended, non tender  Extremities:  No peripheral edema.  Neurologic: Awake, alert, oriented  Skin: No acute lesions or rashes noted  Access: RIJ permcath    Basic Metabolic Panel: Recent Labs  Lab 01/09/20 0840 01/09/20 0840 01/09/20 1507 01/10/20 0144 01/10/20 0144 01/11/20 0504 01/12/20 0400 01/13/20 0712  NA 131*  --   --  137  --  141 137 136  K 3.6  --   --  3.2*  --  3.7 3.8 3.8  CL 96*  --   --  97*  --  103 99 99  CO2 26  --   --  27  --  30 25 28   GLUCOSE 465*  --   --  164*  --  192* 201* 218*  BUN 39*  --   --  21*  --  34* 44* 25*  CREATININE 3.29*   < > 2.19* 2.31*  --  3.78* 4.64* 3.38*  CALCIUM 8.2*   < >  --  8.0*   < > 8.1* 7.7* 7.9*  MG  --   --   --   --   --   --  2.0 1.8   < > = values in this interval not displayed.    Liver Function Tests: No results for input(s): AST, ALT, ALKPHOS, BILITOT, PROT, ALBUMIN in the last 168 hours. No results for input(s):  LIPASE, AMYLASE in the last 168 hours. No results for input(s): AMMONIA in the last 168 hours.  CBC: Recent Labs  Lab 01/09/20 0840 01/09/20 0840 01/09/20 1507 01/09/20 1507 01/10/20 0642 01/11/20 0504 01/12/20 0400 01/12/20 0929 01/13/20 0712  WBC 6.1   < > 5.0  --   --  4.9 5.9 5.8 3.0*  NEUTROABS 5.0  --   --   --   --   --   --   --   --   HGB 7.4*   < > 6.7*   < > 6.8* 7.0* 6.6* 7.1* 11.6*  HCT 23.1*   < > 20.7*   < > 21.8* 22.7* 20.6* 22.6* 35.7*  MCV 96.3   < > 93.7  --   --  97.8 96.3 96.6 93.9  PLT 147*   < > 144*  --   --  172 208 204 149*   < > = values in this interval not displayed.    Cardiac Enzymes: No results for input(s): CKTOTAL, CKMB, CKMBINDEX, TROPONINI  in the last 168 hours.  BNP: Invalid input(s): POCBNP  CBG: Recent Labs  Lab 01/12/20 1255 01/12/20 1705 01/12/20 2037 01/13/20 0822 01/13/20 1127  GLUCAP 175* 217* 256* 374* 335*    Microbiology: Results for orders placed or performed during the hospital encounter of 01/09/20  SARS Coronavirus 2 by RT PCR (hospital order, performed in [facility] hospital lab) Nasopharyngeal Nasopharyngeal Swab     Status: None   Collection Time: 01/09/20  8:40 AM   Specimen: Nasopharyngeal Swab  Result Value Ref Range Status   SARS Coronavirus 2 NEGATIVE NEGATIVE Final    Comment: (NOTE) SARS-CoV-2 target nucleic acids are NOT DETECTED.  The SARS-CoV-2 RNA is generally detectable in upper and lower respiratory specimens during the acute phase of infection. The lowest concentration of SARS-CoV-2 viral copies this assay can detect is 250 copies / mL. A negative result does not preclude SARS-CoV-2 infection and should not be used as the sole basis for treatment or other patient management decisions.  A negative result may occur with improper specimen collection / handling, submission of specimen other than nasopharyngeal swab, presence of viral mutation(s) within the areas targeted by this assay, and  inadequate number of viral copies (<250 copies / mL). A negative result must be combined with clinical observations, patient history, and epidemiological information.  Fact Sheet for Patients:   StrictlyIdeas.no  Fact Sheet for Healthcare Providers: BankingDealers.co.za  This test is not yet approved or  cleared by the [place] FDA and has been authorized for detection and/or diagnosis of SARS-CoV-2 by FDA under an Emergency Use Authorization (EUA).  This EUA will remain in effect (meaning this test can be used) for the duration of the COVID-19 declaration under Section 564(b)(1) of the Act, 21 U.S.C. section 360bbb-3(b)(1), unless the authorization is terminated or revoked sooner.  Performed at [facility], [address] [place], [place] [address]   MRSA PCR Screening     Status: None   Collection Time: 01/12/20  5:25 AM   Specimen: Nasopharyngeal  Result Value Ref Range Status   MRSA by PCR NEGATIVE NEGATIVE Final    Comment:        The GeneXpert MRSA Assay (FDA approved for NASAL specimens only), is one component of a comprehensive MRSA colonization surveillance program. It is not intended to diagnose MRSA infection nor to guide or monitor treatment for MRSA infections. Performed at [facility], [address] [place], [place] [address]     Coagulation Studies: No results for input(s): LABPROT, INR in the last 72 hours.  Urinalysis: No results for input(s): COLORURINE, LABSPEC, PHURINE, GLUCOSEU, HGBUR, BILIRUBINUR, KETONESUR, PROTEINUR, UROBILINOGEN, NITRITE, LEUKOCYTESUR in the last 72 hours.  Invalid input(s): APPERANCEUR    Imaging: No results found.   Medications:   . sodium chloride     . sodium chloride   Intravenous Once  . aspirin EC  81 mg Oral Daily  . carvedilol  6.25 mg Oral BID WC  . Chlorhexidine Gluconate Cloth  6 each Topical Q0600  . feeding supplement (NEPRO  CARB STEADY)  237 mL Oral TID BM  . folic acid  1 mg Oral Daily  . hydrALAZINE  25 mg Oral Q8H  . insulin aspart  0-6 Units Subcutaneous TID WC  . insulin aspart  2 Units Subcutaneous TID WC  . insulin glargine  8 Units Subcutaneous Daily  . iron polysaccharides  150 mg Oral Daily  . melatonin  5 mg Oral QHS  . multivitamin  1 tablet Oral Daily  . sacubitril-valsartan  1 tablet Oral BID  . sertraline  100 mg Oral Daily  . sodium chloride flush  3 mL Intravenous Q12H  . thiamine  100 mg Oral Daily   sodium chloride, acetaminophen, ondansetron (ZOFRAN) IV, sodium chloride flush  Assessment/ Plan:  [name] is a 59 y.o. black male with end stage renal disease on hemodialysis, diabetes mellitus type 2, hypertension, history of CVA, hyperlipidemia, history of alcohol abuse who was admitted on 01/09/2020 for ESRD on dialysis [facility]) [N18.6, Z99.2] Acute on chronic respiratory failure with hypoxia ([place]) [J96.21] Acute on chronic respiratory failure with hypoxemia ([place]) [J96.21] Acute on chronic congestive heart failure, unspecified heart failure type ([place]) [I50.9]  [place]. RIJ permcath 61.5kg   1. End Stage Renal Disease:  Will continue TTS schedule  2. Hypertension:  Home regimen of Entresto, carvedilol, and hydralazine.   3. Anemia of chronic kidney disease: status post PRBC transfusion on 8/31. Endoscopy on 8/21 with no source of bleed.  - EPO with HD treatment  4. Secondary Hyperparathyroidism: not currently on a binder.    LOS: 4 Jaely Silman 9/1/202112:55 PM

## 2020-01-13 NOTE — Progress Notes (Signed)
Made  Dr. Mal Misty aware by ccmd patient had 14 beat run vtach. Patient is asymptomatic. Potassium is 3.8 and magnesium 1.8. will continue to monitor

## 2020-01-13 NOTE — Progress Notes (Signed)
[name] to be D/C'd Home per MD order.  Discussed prescriptions and follow up appointments with the patient. No Prescriptionsmedication list explained in detail. Pt and wife verbalized understanding.  Allergies as of 01/13/2020      Reactions   Ferrous Gluconate Nausea And Vomiting   Other       Medication List    TAKE these medications   aspirin 81 MG EC tablet Take 1 tablet (81 mg total) by mouth daily.   carvedilol 6.25 MG tablet Commonly known as: COREG Take 6.25 mg by mouth 2 (two) times daily with a meal.   Entresto 24-26 MG Generic drug: sacubitril-valsartan Take 1 tablet by mouth 2 (two) times daily.   epoetin alfa 10000 UNIT/ML injection Commonly known as: EPOGEN Inject 0.4 mLs (4,000 Units total) into the vein Every Tuesday,Thursday,and Saturday with dialysis.   feeding supplement (NEPRO CARB STEADY) Liqd Take 237 mLs by mouth 3 (three) times daily between meals.   folic acid 1 MG tablet Commonly known as: FOLVITE Take 1 mg by mouth daily.   hydrALAZINE 25 MG tablet Commonly known as: APRESOLINE Take 1 tablet (25 mg total) by mouth in the morning and at bedtime.   insulin glargine 100 UNIT/ML injection Commonly known as: LANTUS Inject 0.04 mLs (4 Units total) into the skin daily.   insulin lispro 100 UNIT/ML KwikPen Commonly known as: HumaLOG KwikPen Inject 0.03 mLs (3 Units total) into the skin 3 (three) times daily with meals.   iron polysaccharides 150 MG capsule Commonly known as: NIFEREX Take 1 capsule (150 mg total) by mouth daily.   melatonin 3 MG Tabs tablet Take 1 tablet by mouth at bedtime.   multivitamin Tabs tablet Take 1 tablet by mouth daily.   nicotine 7 mg/24hr patch Commonly known as: NICODERM CQ - dosed in mg/24 hr Place 1 patch (7 mg total) onto the skin daily.   sertraline 100 MG tablet Commonly known as: ZOLOFT Take 100 mg by mouth daily.   thiamine 100 MG tablet Commonly known as: VITAMIN B-1 Take 100 mg by mouth  daily.            Discharge Care Instructions  (From admission, onward)         Start     Ordered   01/13/20 0000  Discharge wound care:       Comments: Keep area clean and dry   01/13/20 1243          Vitals:   01/13/20 1100 01/13/20 1124  BP:  138/76  Pulse:  86  Resp:  18  Temp:  98 F (36.7 C)  SpO2: 91% 90%    Skin clean, dry and intact without evidence of skin break down, no evidence of skin tears noted. IV catheter discontinued intact. Site without signs and symptoms of complications. Dressing and pressure applied. Pt denies pain at this time. No complaints noted.  An After Visit Summary was printed and given to the patient. Patient escorted via [place], and D/C home via private auto.  [place]

## 2020-01-13 NOTE — Discharge Summary (Signed)
Physician Discharge Summary  [name] DTO:[id number] DOB: Sep 22, 1960 DOA: 01/09/2020  PCP: [name], MD  Admit date: 01/09/2020 Discharge date: 01/13/2020  Discharge disposition: Home with home health care   Recommendations for Outpatient Follow-Up:   Follow-up with PCP in 1 week Follow-up with nephrologist for outpatient hemodialysis on Tuesdays, Thursdays and Saturdays   Discharge Diagnosis:   Principal Problem:   Acute on chronic respiratory failure with hypoxemia ([place]) Active Problems:   Uncontrolled diabetes mellitus ([place])   Alcohol abuse   Tobacco abuse   HTN (hypertension)   Protein-calorie malnutrition, severe   ESRD on dialysis ([place])   DM (diabetes mellitus), type 2 with complications ([place])   Acute on chronic systolic CHF (congestive heart failure) (H[facility])   Severe mitral regurgitation    Discharge Condition: Stable.  Diet recommendation:  Diet Order            Diet Carb Modified           Diet renal 60/70-06-15-1198                       Code Status: Full Code     Hospital Course:   [name] is a 59 year old male past medical history of end-stage renal disease on hemodialysis, chronic systolic heart failure with severe pulmonary hypertension and mitral regurg, diabetes and alcohol abuse came to the emergency room on 8/28 after hemodialysis when he was found to be short of breath.  He had been recently discharged from the hospital on 01/04/2020 after hospitalization for acute on chronic respiratory failure, aspiration pneumonia, pulmonary edema and bilateral pleural effusions.  At that time, he was discharged on 4 L oxygen via nasal cannula.  On admission, patient was hypoxemic requiring 6 L/min oxygen via nasal cannula.  He was admitted to the hospital for acute on chronic hypoxemic respiratory failure secondary to fluid overload/chronic systolic CHF exacerbation.  He was seen by the nephrologist and he underwent hemodialysis to remove  excess fluid.  He also developed acute on chronic anemia and was transfused 1 unit of packed red blood cells.  H&H remained stable.  Of note, he had EGD on 01/02/2020 which was unremarkable.  He also had a colonoscopy in October 2020 which showed multiple polyps (which were removed) and hemorrhoids.  He had a 14 beat run of NSVT on telemetry.  He was asymptomatic.  He feels better and he wants to be discharged home today.  He is deemed stable for discharge to home today.  No plan for hemodialysis today per nephrologist, Dr. Juleen China.  Case was discussed with him at the bedside.  Discharge plan was discussed with the patient and his wife.  Patient said he uses 3 L/min oxygen at home but his wife said he uses 4 L/min oxygen at home.   Medical Consultants:    Nephrologist   Discharge Exam:    Vitals:   01/13/20 0830 01/13/20 0830 01/13/20 1100 01/13/20 1124  BP:  (!) 147/68  138/76  Pulse:  93  86  Resp:  19  18  Temp:  97.7 F (36.5 C)  98 F (36.7 C)  TempSrc:  Oral  Oral  SpO2: (!) 86% 98% 91% 90%  Weight:      Height:         GEN: NAD SKIN: No rash EYES: EOMI ENT: MMM CV: RRR PULM: CTA B ABD: soft, ND, NT, +BS CNS: AAO x 3, non focal EXT: No edema or  tenderness   The results of significant diagnostics from this hospitalization (including imaging, microbiology, ancillary and laboratory) are listed below for reference.     Procedures and Diagnostic Studies:   DG Chest Portable 1 View  Result Date: 01/09/2020 CLINICAL DATA:  Shortness of breath.  Evaluate for edema. EXAM: PORTABLE CHEST 1 VIEW COMPARISON:  January 02, 2020 FINDINGS: A dialysis catheter is stable. No pneumothorax. Diffuse bilateral pulmonary opacities are similar since December 13, 2019. A small left effusion with underlying atelectasis is suspected. The cardiomediastinal silhouette is stable. No other acute abnormalities. IMPRESSION: 1. Mild cardiomegaly with diffuse bilateral pulmonary opacities most  suggestive of pulmonary edema. Recommend clinical correlation and follow-up to resolution. 2. Stable dialysis catheter. 3. No other acute abnormalities. Electronically Signed   By: Dorise Bullion III M.D   On: 01/09/2020 09:13     Labs:   Basic Metabolic Panel: Recent Labs  Lab 01/09/20 0840 01/09/20 0840 01/09/20 1507 01/10/20 0144 01/10/20 0144 01/11/20 0504 01/11/20 0504 01/12/20 0400 01/13/20 0712  NA 131*  --   --  137  --  141  --  137 136  K 3.6   < >  --  3.2*   < > 3.7   < > 3.8 3.8  CL 96*  --   --  97*  --  103  --  99 99  CO2 26  --   --  27  --  30  --  25 28  GLUCOSE 465*  --   --  164*  --  192*  --  201* 218*  BUN 39*  --   --  21*  --  34*  --  44* 25*  CREATININE 3.29*   < > 2.19* 2.31*  --  3.78*  --  4.64* 3.38*  CALCIUM 8.2*  --   --  8.0*  --  8.1*  --  7.7* 7.9*  MG  --   --   --   --   --   --   --  2.0 1.8   < > = values in this interval not displayed.   GFR Estimated Creatinine Clearance: 20.4 mL/min (A) (by C-G formula based on SCr of 3.38 mg/dL (H)). Liver Function Tests: No results for input(s): AST, ALT, ALKPHOS, BILITOT, PROT, ALBUMIN in the last 168 hours. No results for input(s): LIPASE, AMYLASE in the last 168 hours. No results for input(s): AMMONIA in the last 168 hours. Coagulation profile No results for input(s): INR, PROTIME in the last 168 hours.  CBC: Recent Labs  Lab 01/09/20 0840 01/09/20 0840 01/09/20 1507 01/09/20 1507 01/10/20 0642 01/11/20 0504 01/12/20 0400 01/12/20 0929 01/13/20 0712  WBC 6.1   < > 5.0  --   --  4.9 5.9 5.8 3.0*  NEUTROABS 5.0  --   --   --   --   --   --   --   --   HGB 7.4*   < > 6.7*   < > 6.8* 7.0* 6.6* 7.1* 11.6*  HCT 23.1*   < > 20.7*   < > 21.8* 22.7* 20.6* 22.6* 35.7*  MCV 96.3   < > 93.7  --   --  97.8 96.3 96.6 93.9  PLT 147*   < > 144*  --   --  172 208 204 149*   < > = values in this interval not displayed.   Cardiac Enzymes: No results for input(s): CKTOTAL, CKMB, CKMBINDEX,  TROPONINI in  the last 168 hours. BNP: Invalid input(s): POCBNP CBG: Recent Labs  Lab 01/12/20 1255 01/12/20 1705 01/12/20 2037 01/13/20 0822 01/13/20 1127  GLUCAP 175* 217* 256* 374* 335*   D-Dimer No results for input(s): DDIMER in the last 72 hours. Hgb A1c No results for input(s): HGBA1C in the last 72 hours. Lipid Profile No results for input(s): CHOL, HDL, LDLCALC, TRIG, CHOLHDL, LDLDIRECT in the last 72 hours. Thyroid function studies No results for input(s): TSH, T4TOTAL, T3FREE, THYROIDAB in the last 72 hours.  Invalid input(s): FREET3 Anemia work up No results for input(s): VITAMINB12, FOLATE, FERRITIN, TIBC, IRON, RETICCTPCT in the last 72 hours. Microbiology Recent Results (from the past 240 hour(s))  SARS Coronavirus 2 by RT PCR (hospital order, performed in [facility] hospital lab) Nasopharyngeal Nasopharyngeal Swab     Status: None   Collection Time: 01/09/20  8:40 AM   Specimen: Nasopharyngeal Swab  Result Value Ref Range Status   SARS Coronavirus 2 NEGATIVE NEGATIVE Final    Comment: (NOTE) SARS-CoV-2 target nucleic acids are NOT DETECTED.  The SARS-CoV-2 RNA is generally detectable in upper and lower respiratory specimens during the acute phase of infection. The lowest concentration of SARS-CoV-2 viral copies this assay can detect is 250 copies / mL. A negative result does not preclude SARS-CoV-2 infection and should not be used as the sole basis for treatment or other patient management decisions.  A negative result may occur with improper specimen collection / handling, submission of specimen other than nasopharyngeal swab, presence of viral mutation(s) within the areas targeted by this assay, and inadequate number of viral copies (<250 copies / mL). A negative result must be combined with clinical observations, patient history, and epidemiological information.  Fact Sheet for Patients:   StrictlyIdeas.no  Fact Sheet for  Healthcare Providers: BankingDealers.co.za  This test is not yet approved or  cleared by the [place] FDA and has been authorized for detection and/or diagnosis of SARS-CoV-2 by FDA under an Emergency Use Authorization (EUA).  This EUA will remain in effect (meaning this test can be used) for the duration of the COVID-19 declaration under Section 564(b)(1) of the Act, 21 U.S.C. section 360bbb-3(b)(1), unless the authorization is terminated or revoked sooner.  Performed at [facility], [address] [place], [place] [address]   MRSA PCR Screening     Status: None   Collection Time: 01/12/20  5:25 AM   Specimen: Nasopharyngeal  Result Value Ref Range Status   MRSA by PCR NEGATIVE NEGATIVE Final    Comment:        The GeneXpert MRSA Assay (FDA approved for NASAL specimens only), is one component of a comprehensive MRSA colonization surveillance program. It is not intended to diagnose MRSA infection nor to guide or monitor treatment for MRSA infections. Performed at [facility], [address] [place], [place] [address]      Discharge Instructions:   Discharge Instructions    Diet Carb Modified   Complete by: As directed    Diet renal 60/70-06-15-1198   Complete by: As directed    Discharge instructions   Complete by: As directed    Continue outpatient hemodialysis on Tuesdays, Thursdays and Saturdays   Discharge wound care:   Complete by: As directed    Keep area clean and dry   Face-to-face encounter (required for Medicare/Medicaid patients)   Complete by: As directed    I Tempie Gibeault certify that this patient is under my care and that I, or a  nurse practitioner or physician's assistant working with me, had a face-to-face encounter that meets the physician face-to-face encounter requirements with this patient on 01/13/2020. The encounter with the patient was in whole, or in part for the following medical condition(s)  which is the primary reason for home health care (List medical condition): Debility, CHF, ESRD   The encounter with the patient was in whole, or in part, for the following medical condition, which is the primary reason for home health care: Debility, CHF, ESRD   I certify that, based on my findings, the following services are medically necessary home health services:  Nursing Physical therapy     Reason for Medically Necessary Home Health Services:  Therapy- Personnel officer, Public librarian Skilled Nursing- Change/Decline in Patient Status     My clinical findings support the need for the above services:  Unable to leave home safely without assistance and/or assistive device Shortness of breath with activity     Further, I certify that my clinical findings support that this patient is homebound due to:  Shortness of Breath with activity Unable to leave home safely without assistance     Home Health   Complete by: As directed    To provide the following care/treatments:  PT OT RN Home Health Aide     Increase activity slowly   Complete by: As directed      Allergies as of 01/13/2020      Reactions   Ferrous Gluconate Nausea And Vomiting   Other       Medication List    TAKE these medications   aspirin 81 MG EC tablet Take 1 tablet (81 mg total) by mouth daily.   carvedilol 6.25 MG tablet Commonly known as: COREG Take 6.25 mg by mouth 2 (two) times daily with a meal.   Entresto 24-26 MG Generic drug: sacubitril-valsartan Take 1 tablet by mouth 2 (two) times daily.   epoetin alfa 10000 UNIT/ML injection Commonly known as: EPOGEN Inject 0.4 mLs (4,000 Units total) into the vein Every Tuesday,Thursday,and Saturday with dialysis.   feeding supplement (NEPRO CARB STEADY) Liqd Take 237 mLs by mouth 3 (three) times daily between meals.   folic acid 1 MG tablet Commonly known as: FOLVITE Take 1 mg by mouth daily.   hydrALAZINE 25 MG tablet Commonly  known as: APRESOLINE Take 1 tablet (25 mg total) by mouth in the morning and at bedtime.   insulin glargine 100 UNIT/ML injection Commonly known as: LANTUS Inject 0.04 mLs (4 Units total) into the skin daily.   insulin lispro 100 UNIT/ML KwikPen Commonly known as: HumaLOG KwikPen Inject 0.03 mLs (3 Units total) into the skin 3 (three) times daily with meals.   iron polysaccharides 150 MG capsule Commonly known as: NIFEREX Take 1 capsule (150 mg total) by mouth daily.   melatonin 3 MG Tabs tablet Take 1 tablet by mouth at bedtime.   multivitamin Tabs tablet Take 1 tablet by mouth daily.   nicotine 7 mg/24hr patch Commonly known as: NICODERM CQ - dosed in mg/24 hr Place 1 patch (7 mg total) onto the skin daily.   sertraline 100 MG tablet Commonly known as: ZOLOFT Take 100 mg by mouth daily.   thiamine 100 MG tablet Commonly known as: VITAMIN B-1 Take 100 mg by mouth daily.            Discharge Care Instructions  (From admission, onward)         Start     Ordered  01/13/20 0000  Discharge wound care:       Comments: Keep area clean and dry   01/13/20 1243          Follow-up Information    [facility] Follow up on 01/28/2020.   Specialty: Cardiology Why: at 2:00pm. Enter through the [place] entrance Contact information: [place] [phone]               Time coordinating discharge: 35 minutes  Signed:  Jennye Boroughs  [facility] 01/13/2020, 1:26 PM   Pager on [email or web address]. If 7PM-7AM, please contact night-coverage at [email or web address]

## 2020-01-13 NOTE — TOC Initial Note (Signed)
fTransition of Care [facility]) - Initial/Assessment Note    Patient Details  Name: [name] MRN: [id number] Date of Birth: Sep 14, 1960  Transition of Care [facility]) CM/SW Contact:    Eileen Stanford, LCSW Phone Number: 01/13/2020, 1:27 PM  Clinical Narrative:    Pt lives at home with spouse. Pt is active with [facility]. [facility] aware pt will d/c today. MD has put in resumption orders. No equipment recommended. NO additional needs at this time.               Expected Discharge Plan: [place] Barriers to Discharge: No Barriers Identified   Patient Goals and CMS Choice        Expected Discharge Plan and Services Expected Discharge Plan: [place] In-house Referral: Clinical Social Work   Post Acute Care Choice: [place] arrangements for the past 2 months: [place] Expected Discharge Date: 01/13/20                         HH Arranged: PT, OT, RN, Nurse's Aide HH Agency: [facility] Date HH Agency Contacted: 01/13/20 Time HH Agency Contacted: 12 Representative spoke with at [place]: erin  Prior Living Arrangements/Services Living arrangements for the past 2 months: [place] with:: Spouse Patient language and need for interpreter reviewed:: Yes Do you feel safe going back to the place where you live?: Yes      Need for Family Participation in Patient Care: Yes (Comment) Care giver support system in place?: Yes (comment)   Criminal Activity/Legal Involvement Pertinent to Current Situation/Hospitalization: No - Comment as needed  Activities of Daily Living Home Assistive Devices/Equipment: [place] (specify type) ADL Screening (condition at time of admission) Patient's cognitive ability adequate to safely complete daily activities?: Yes Is the patient deaf or have difficulty hearing?: No Does the patient have difficulty seeing, even when wearing glasses/contacts?:  No Does the patient have difficulty concentrating, remembering, or making decisions?: No Patient able to express need for assistance with ADLs?: Yes Does the patient have difficulty dressing or bathing?: Yes Independently performs ADLs?: No Does the patient have difficulty walking or climbing stairs?: Yes Weakness of Legs: Both Weakness of Arms/Hands: None  Permission Sought/Granted Permission sought to share information with : Family Supports    Share Information with NAME: Jacquline  Permission granted to share info w AGENCY: [facility]  Permission granted to share info w Relationship: spouse     Emotional Assessment Appearance:: Appears stated age Attitude/Demeanor/Rapport: Engaged Affect (typically observed): Appropriate Orientation: : Oriented to Self, Oriented to Place, Oriented to  Time, Oriented to Situation Alcohol / Substance Use: Not Applicable Psych Involvement: No (comment)  Admission diagnosis:  ESRD on dialysis ([place]) [N18.6, Z99.2] Acute on chronic respiratory failure with hypoxia ([place]) [J96.21] Acute on chronic respiratory failure with hypoxemia (HCC) [J96.21] Acute on chronic congestive heart failure, unspecified heart failure type [facility]) [I50.9] Patient Active Problem List   Diagnosis Date Noted   Acute on chronic systolic CHF (congestive heart failure) ([place]) 01/10/2020   Severe mitral regurgitation 01/10/2020   Acute on chronic congestive heart failure ([place])    Acute on chronic respiratory failure with hypoxemia ([place]) 12/29/2019   Aspiration pneumonia of both lower lobes due to gastric secretions ([place]) 12/29/2019   Diabetic foot infection ([place]) 12/07/2019   Acute pulmonary edema ([place]) 12/07/2019   Paronychia of second toe of left foot 11/09/2019  Chronic osteomyelitis of toe of left foot ([place]) 11/09/2019   DM (diabetes mellitus), type 2 with complications ([place]) 83/41/9622   Hypoglycemia associated with diabetes ([place]) 08/24/2019   Acute  respiratory failure with hypoxia (HCC)    Acute metabolic encephalopathy 29/79/8921   Hypertensive emergency 07/06/2019   Acute encephalopathy 07/06/2019   Seizures due to metabolic disorder ([place]) 19/41/7408   Hyperosmolar hyperglycemic state (HHS) ([place]) 06/05/2019   Symptomatic anemia 06/04/2019   Frequent falls 06/04/2019   History of CVA (cerebrovascular accident) 06/04/2019   HCAP (healthcare-associated pneumonia) 06/04/2019   Acute blood loss anemia 06/04/2019   Hyperglycemia 05/28/2019   Fall at home, initial encounter 05/28/2019   Rib fracture 05/28/2019   Depression 05/28/2019   Hypokalemia 05/28/2019   Weakness 05/27/2019   ESRD on dialysis [facility])    Acute renal failure (ARF) ([place]) 04/09/2019   [place] of ascending colon    Diarrhea    AKI (acute kidney injury) ([place]) 03/04/2019   Acute kidney injury ([place]) 07/26/2018   ARF (acute renal failure) ([place]) 07/25/2018   Bilateral leg numbness 03/19/2018   Numbness and tingling of both feet 03/19/2018   CVA (cerebral vascular accident) ([place]) 03/17/2018   Moderate episode of recurrent major depressive disorder ([place]) 03/11/2018   UTI (urinary tract infection) 02/27/2018   Hypoglycemia 01/15/2018   Type 2 diabetes mellitus without complication, with long-term current use of insulin ([place]) 08/27/2017   Protein-calorie malnutrition, severe 08/19/2017   Pancreatitis, acute 08/16/2017   DKA (diabetic ketoacidoses) ([place]) 08/16/2017   HTN (hypertension) 08/16/2017   HLD (hyperlipidemia) 08/16/2017   Carotid stenosis 08/02/2016   GERD (gastroesophageal reflux disease) 08/02/2016   History of esophagogastroduodenoscopy (EGD) 07/01/2016   History of recent blood transfusion 07/01/2016   Acute renal failure superimposed on stage 4 chronic kidney disease ([place]) 07/01/2016   Hyponatremia 07/01/2016   Uncontrolled diabetes mellitus ([place]) 07/01/2016   Alcohol abuse 07/01/2016   Monilial esophagitis  ([place]) 07/01/2016   Tobacco abuse 07/01/2016   Confusion 06/29/2016   Iron deficiency anemia 06/29/2016   Coagulopathy ([place]) 06/29/2016   PCP:  [name], MD Pharmacy:   [id number] - [address], [place] AT [address] [place] [address] Phone: [phone] Fax: [phone]     Social Determinants of Health (SDOH) Interventions    Readmission Risk Interventions Readmission Risk Prevention Plan 12/29/2019 12/09/2019 07/07/2019  Transportation Screening Complete Complete Complete  PCP or Specialist Appt within 3-5 Days - - Complete  HRI or [place] - - Complete  Palliative Care Screening - - -  Medication Review (RN Care Manager) Complete Complete Complete  PCP or Specialist appointment within 3-5 days of discharge Complete Complete -  [place] or Home Care Consult Complete Complete -  SW Recovery Care/Counseling Consult Complete Complete -  Palliative Care Screening Not Applicable Not Applicable -  [place] Not Applicable Not Applicable -  Some recent data might be hidden

## 2020-01-13 NOTE — Progress Notes (Signed)
Physical Therapy Treatment Patient Details Name: [name] MRN: [id number] DOB: 05-10-1961 Today's Date: 01/13/2020    History of Present Illness Pt is a 59 y/o M with PMH: ESRD on HD TTS, chronic hypoxic respiratory failure, DM with multiple hyerglycemia admissions, multiple CVA, HTN, ETOH abuse, 2nd toe amputation (12/11/19). Pt recently d/c'ed 8/23 with 4 L of oxygen and at that time had been admitted for anemia and GI work-up and received a blood transfusion. On this admission, pt presented SOB following HD. Pt req'ed 6Lnc at the time. CXR w/ pulmonary edema. Pt adm for acute on chronic respiratory failure with hypoxemia.    PT Comments    Pt lying in bed upon arrival to room and required max encouragement to participate. Pt agreeable to getting up to the chair and nothing more. Pt mod I for supine to sit for increased time and required no external physical assistance. Pt transferred sit <> stand with CGA and use of RW and took several steps from bed to recliner chair with CGA for steadying. Pt continued to refuse further activities despite education on benefits of mobility and participation in PT. Pt remained on 2L O2 throughout session and noted no SOB or difficulty breathing despite activity. Will continue to follow and progress pt as agreeable and tolerated.    Follow Up Recommendations  Home health PT;Supervision for mobility/OOB     Equipment Recommendations  None recommended by PT    Recommendations for Other Services       Precautions / Restrictions Precautions Precautions: Fall Restrictions Weight Bearing Restrictions: No LLE Weight Bearing: Weight bearing as tolerated    Mobility  Bed Mobility Overal bed mobility: Modified Independent Bed Mobility: Supine to Sit Rolling: Modified independent (Device/Increase time)         General bed mobility comments: pt required no external physical assistance to perform bed mobility; required increased time to  perform  Transfers Overall transfer level: Needs assistance Equipment used: Rolling walker (2 wheeled) Transfers: Sit to/from Stand Sit to Stand: Min guard         General transfer comment: pt performed sit <> stand transfer with CGA for steadying; pt self initiated and able to power through BLE with bed at lowest setting  Ambulation/Gait Ambulation/Gait assistance: Min guard Gait Distance (Feet): 3 Feet Assistive device: Rolling walker (2 wheeled)   Gait velocity: decreased   General Gait Details: pt ambulated 3 feet with use of RW and CGA for steadying   Stairs             Wheelchair Mobility    Modified Rankin (Stroke Patients Only)       Balance Overall balance assessment: Needs assistance Sitting-balance support: Feet supported Sitting balance-Leahy Scale: Good Sitting balance - Comments: no overt LOB noted while sitting EOB   Standing balance support: Bilateral upper extremity supported Standing balance-Leahy Scale: Fair Standing balance comment: CGA for balance with BUE on RW                            Cognition Arousal/Alertness: Awake/alert Behavior During Therapy: WFL for tasks assessed/performed Overall Cognitive Status: No family/caregiver present to determine baseline cognitive functioning                                        Exercises      General Comments  Pertinent Vitals/Pain Pain Assessment: No/denies pain    Home Living                      Prior Function            PT Goals (current goals can now be found in the care plan section) Acute Rehab PT Goals Patient Stated Goal: to return home PT Goal Formulation: With patient Time For Goal Achievement: 01/25/20 Potential to Achieve Goals: Fair Progress towards PT goals: Progressing toward goals    Frequency    Min 2X/week      PT Plan Current plan remains appropriate    Co-evaluation              AM-PAC PT "6  Clicks" Mobility   Outcome Measure  Help needed turning from your back to your side while in a flat bed without using bedrails?: None Help needed moving from lying on your back to sitting on the side of a flat bed without using bedrails?: A Little Help needed moving to and from a bed to a chair (including a wheelchair)?: A Little Help needed standing up from a chair using your arms (e.g., wheelchair or bedside chair)?: A Little Help needed to walk in hospital room?: A Little Help needed climbing 3-5 steps with a railing? : A Lot 6 Click Score: 18    End of Session Equipment Utilized During Treatment: Oxygen;Gait belt (2L O2 via nasal cannula) Activity Tolerance: Patient limited by fatigue;Other (comment) (pt refused further activity attempts) Patient left: in chair;with call bell/phone within reach;with chair alarm set Nurse Communication: Mobility status PT Visit Diagnosis: Unsteadiness on feet (R26.81);Muscle weakness (generalized) (M62.81);History of falling (Z91.81);Pain     Time: 0626-9485 PT Time Calculation (min) (ACUTE ONLY): 14 min  Charges:                        Vale Haven, SPT   Vale Haven 01/13/2020, 12:07 PM

## 2020-01-20 ENCOUNTER — Telehealth: Payer: Self-pay | Admitting: Family

## 2020-01-20 NOTE — Telephone Encounter (Signed)
We spoke to wife who changed his new patient CHF Clinic appointment too 9/22 at 130pm. This appointment was made while patient was in hospital. She said he is doing great with no complaints at this time.   [name], NT

## 2020-01-28 ENCOUNTER — Ambulatory Visit: Payer: Medicare Other | Admitting: Family

## 2020-02-02 NOTE — Progress Notes (Deleted)
   [name], male    DOB: 1961/02/18, 59 y.o.   MRN: [id number]  HPI  [name] is a 59 y/o male with a history of  Echo report from 12/07/19 reviewed and showed an EF of 45-50% along with severely elevated PA pressure, moderate LAE, mild AR, moderate TR and moderate/severe AR.   Admitted 01/09/20 due to shortness of breath. Nephrology consulted and dialysis completed. Given 1 until of PRBC's due to anemia. Continued on oxygen at 3-4L. Discharged after 4 days. Admitted 12/24/19 due to  Review of Systems    Physical Exam    Assessment & Plan:  1: Chronic heart failure with mildly reduced ejection fraction- - NYHA class  2:

## 2020-02-03 ENCOUNTER — Ambulatory Visit: Payer: Medicare Other | Admitting: Family

## 2020-02-03 ENCOUNTER — Telehealth: Payer: Self-pay | Admitting: Family

## 2020-02-03 NOTE — Telephone Encounter (Signed)
Patient did not show for his Heart Failure Clinic appointment on 02/03/20. Will attempt to reschedule.

## 2020-02-05 ENCOUNTER — Telehealth: Payer: Self-pay | Admitting: Adult Health Nurse Practitioner

## 2020-02-05 NOTE — Telephone Encounter (Signed)
Called patient to schedule a Palliative Consult, no answer - left message with reason for call along with my name and call back number.

## 2020-02-17 ENCOUNTER — Encounter: Payer: Self-pay | Admitting: Family

## 2020-02-17 ENCOUNTER — Other Ambulatory Visit: Payer: Self-pay

## 2020-02-17 ENCOUNTER — Telehealth: Payer: Self-pay | Admitting: Adult Health Nurse Practitioner

## 2020-02-17 ENCOUNTER — Ambulatory Visit: Payer: Medicare Other | Attending: Family | Admitting: Family

## 2020-02-17 VITALS — BP 131/78 | HR 74 | Resp 16 | Ht 71.0 in | Wt 133.0 lb

## 2020-02-17 DIAGNOSIS — I340 Nonrheumatic mitral (valve) insufficiency: Secondary | ICD-10-CM | POA: Diagnosis not present

## 2020-02-17 DIAGNOSIS — N179 Acute kidney failure, unspecified: Secondary | ICD-10-CM | POA: Insufficient documentation

## 2020-02-17 DIAGNOSIS — Z7982 Long term (current) use of aspirin: Secondary | ICD-10-CM | POA: Diagnosis not present

## 2020-02-17 DIAGNOSIS — I132 Hypertensive heart and chronic kidney disease with heart failure and with stage 5 chronic kidney disease, or end stage renal disease: Secondary | ICD-10-CM | POA: Insufficient documentation

## 2020-02-17 DIAGNOSIS — I5022 Chronic systolic (congestive) heart failure: Secondary | ICD-10-CM | POA: Diagnosis not present

## 2020-02-17 DIAGNOSIS — Z794 Long term (current) use of insulin: Secondary | ICD-10-CM | POA: Diagnosis not present

## 2020-02-17 DIAGNOSIS — E1122 Type 2 diabetes mellitus with diabetic chronic kidney disease: Secondary | ICD-10-CM | POA: Insufficient documentation

## 2020-02-17 DIAGNOSIS — I6529 Occlusion and stenosis of unspecified carotid artery: Secondary | ICD-10-CM | POA: Insufficient documentation

## 2020-02-17 DIAGNOSIS — Z992 Dependence on renal dialysis: Secondary | ICD-10-CM | POA: Diagnosis not present

## 2020-02-17 DIAGNOSIS — Z736 Limitation of activities due to disability: Secondary | ICD-10-CM | POA: Diagnosis not present

## 2020-02-17 DIAGNOSIS — F17210 Nicotine dependence, cigarettes, uncomplicated: Secondary | ICD-10-CM | POA: Insufficient documentation

## 2020-02-17 DIAGNOSIS — K219 Gastro-esophageal reflux disease without esophagitis: Secondary | ICD-10-CM | POA: Diagnosis not present

## 2020-02-17 DIAGNOSIS — Z79899 Other long term (current) drug therapy: Secondary | ICD-10-CM | POA: Diagnosis not present

## 2020-02-17 DIAGNOSIS — E785 Hyperlipidemia, unspecified: Secondary | ICD-10-CM | POA: Insufficient documentation

## 2020-02-17 DIAGNOSIS — Z8673 Personal history of transient ischemic attack (TIA), and cerebral infarction without residual deficits: Secondary | ICD-10-CM | POA: Diagnosis not present

## 2020-02-17 DIAGNOSIS — I10 Essential (primary) hypertension: Secondary | ICD-10-CM

## 2020-02-17 DIAGNOSIS — N186 End stage renal disease: Secondary | ICD-10-CM | POA: Diagnosis not present

## 2020-02-17 DIAGNOSIS — Z89422 Acquired absence of other left toe(s): Secondary | ICD-10-CM | POA: Insufficient documentation

## 2020-02-17 NOTE — Progress Notes (Signed)
Subjective:    [name], male    DOB: 1961-02-12, 59 y.o.   MRN: [id number]  HPI Patient presents today for initial visit regarding his Acute on chronic congestive heart failure. Patient also has a pertinent hx of Severe mitral regurgitation, Carotid stenosis, HTN, CVA, GERD, Uncontrolled diabetes, and ARF.    Last hospitalized on 01/09/2020; Patient was receiving dialysis and became SOB and was transported to ER. Admitted for SOB and oxygenation was at 2L at baseline at the time and had to be increased to 4L during hospitalization. Patient was discharged on 01/13/2020 with home health to assist with ADL's.   Patient's wife presents with him today at visit. Patient's wife reports she works 7, 12 hour shifts weekly to support the family. Patient is not active and has not been getting out of bed. Patient endorses chronic fatigue, reporting ADL's take too much energy for him to accomplish. Patient reports he has moderate shortness of breath with minimal exertion and that this is chronic in nature.    Past Medical History:  Diagnosis Date  . Chronic kidney disease   . Diabetes mellitus without complication ([place])   . ETOH abuse   . GERD (gastroesophageal reflux disease)   . Hyperlipidemia   . Hypertension   . Stroke [facility])    Past Surgical History:  Procedure Laterality Date  . AMPUTATION TOE Left 12/11/2019   Procedure: AMPUTATION LEFT 2nd TOE;  Surgeon: Sharlotte Alamo, DPM;  Location: [facility] ORS;  Service: Podiatry;  Laterality: Left;  . AV FISTULA PLACEMENT Right 11/25/2019   Procedure: INSERTION OF ARTERIOVENOUS (AV) GORE-TEX GRAFT ARM;  Surgeon: Katha Cabal, MD;  Location: [facility] ORS;  Service: Vascular;  Laterality: Right;  . COLONOSCOPY WITH PROPOFOL N/A 03/06/2019   Procedure: COLONOSCOPY WITH PROPOFOL;  Surgeon: Lucilla Lame, MD;  Location: [facility] ENDOSCOPY;  Service: Endoscopy;  Laterality: N/A;  . DIALYSIS/PERMA CATHETER INSERTION N/A 04/10/2019   Procedure:  DIALYSIS/PERMA CATHETER INSERTION;  Surgeon: Serafina Mitchell, MD;  Location: [place] CV LAB;  Service: Cardiovascular;  Laterality: N/A;  . ESOPHAGOGASTRODUODENOSCOPY (EGD) WITH PROPOFOL N/A 07/01/2016   Procedure: ESOPHAGOGASTRODUODENOSCOPY (EGD) WITH PROPOFOL;  Surgeon: San Jetty, MD;  Location: [facility] ENDOSCOPY;  Service: General;  Laterality: N/A;  . ESOPHAGOGASTRODUODENOSCOPY (EGD) WITH PROPOFOL N/A 12/25/2019   Procedure: ESOPHAGOGASTRODUODENOSCOPY (EGD) WITH PROPOFOL;  Surgeon: Lin Landsman, MD;  Location: [facility] ENDOSCOPY;  Service: Gastroenterology;  Laterality: N/A;  . LOWER EXTREMITY ANGIOGRAPHY Left 12/10/2019   Procedure: Lower Extremity Angiography;  Surgeon: Algernon Huxley, MD;  Location: [place] CV LAB;  Service: Cardiovascular;  Laterality: Left;   Allergies  Allergen Reactions  . Ferrous Gluconate Nausea And Vomiting  . Other    Social History   Socioeconomic History  . Marital status: Married    Spouse name: Not on file  . Number of children: Not on file  . Years of education: Not on file  . Highest education level: Not on file  Occupational History  . Not on file  Tobacco Use  . Smoking status: Current Every Day Smoker    Packs/day: 0.25    Years: 5.00    Pack years: 1.25    Types: Cigars  . Smokeless tobacco: Never Used  Vaping Use  . Vaping Use: Never used  Substance and Sexual Activity  . Alcohol use: Yes    Alcohol/week: 16.0 standard drinks    Types: 14 Cans of beer, 2 Shots of liquor per week  . Drug use:  No  . Sexual activity: Yes  Other Topics Concern  . Not on file  Social History Narrative   Lives with wife, works at [employer] Strain:   . Difficulty of Paying Living Expenses: Not on file  Food Insecurity:   . Worried About Charity fundraiser in the Last Year: Not on file  . Ran Out of Food in the Last Year: Not on file  Transportation Needs:   . Lack of Transportation  (Medical): Not on file  . Lack of Transportation (Non-Medical): Not on file  Physical Activity:   . Days of Exercise per Week: Not on file  . Minutes of Exercise per Session: Not on file  Stress:   . Feeling of Stress : Not on file  Social Connections:   . Frequency of Communication with Friends and Family: Not on file  . Frequency of Social Gatherings with Friends and Family: Not on file  . Attends Religious Services: Not on file  . Active Member of Clubs or Organizations: Not on file  . Attends Archivist Meetings: Not on file  . Marital Status: Not on file   Prior to Admission medications   Medication Sig Start Date End Date Taking? Authorizing Provider  aspirin EC 81 MG EC tablet Take 1 tablet (81 mg total) by mouth daily. 06/10/19  Yes Jennye Boroughs, MD  carvedilol (COREG) 6.25 MG tablet Take 6.25 mg by mouth 2 (two) times daily with a meal.  10/28/19  Yes [provider]  ENTRESTO 24-26 MG Take 1 tablet by mouth 2 (two) times daily. 07/29/19  Yes [provider]  epoetin alfa (EPOGEN) 10000 UNIT/ML injection Inject 0.4 mLs (4,000 Units total) into the vein Every Tuesday,Thursday,and Saturday with dialysis. 01/05/20  Yes Enzo Bi, MD  folic acid (FOLVITE) 1 MG tablet Take 1 mg by mouth daily. 03/12/19  Yes [provider]  hydrALAZINE (APRESOLINE) 25 MG tablet Take 1 tablet (25 mg total) by mouth in the morning and at bedtime. 01/13/20  Yes Jennye Boroughs, MD  insulin glargine (LANTUS) 100 UNIT/ML injection Inject 0.04 mLs (4 Units total) into the skin daily. 01/04/20  Yes Enzo Bi, MD  insulin lispro (HUMALOG KWIKPEN) 100 UNIT/ML KwikPen Inject 0.03 mLs (3 Units total) into the skin 3 (three) times daily with meals. 01/04/20  Yes Enzo Bi, MD  iron polysaccharides (NIFEREX) 150 MG capsule Take 1 capsule (150 mg total) by mouth daily. 01/05/20 04/04/20 Yes Enzo Bi, MD  Melatonin 3 MG TABS Take 1 tablet by mouth at bedtime. 05/23/19  Yes [provider]  multivitamin (RENA-VIT) TABS tablet Take 1 tablet by mouth daily.   Yes [provider]  nicotine (NICODERM CQ - DOSED IN MG/24 HR) 7 mg/24hr patch Place 1 patch (7 mg total) onto the skin daily. 01/05/20 04/04/20 Yes Enzo Bi, MD  Nutritional Supplements (FEEDING SUPPLEMENT, NEPRO CARB STEADY,) LIQD Take 237 mLs by mouth 3 (three) times daily between meals. 07/08/19  Yes Nicole Kindred A, DO  sertraline (ZOLOFT) 100 MG tablet Take 100 mg by mouth daily.  06/17/19  Yes [provider]  thiamine (VITAMIN B-1) 100 MG tablet Take 100 mg by mouth daily.   Yes [provider]    Review of Systems  Constitutional: Positive for activity change (Patient reports he only gets up to eat and sometimes shower), appetite change (Reports he is a "picky " eater and only nibbles at food)  and fatigue (Patient reports chronic fatigue).  HENT: Negative.  Negative for congestion, drooling, ear pain, facial swelling, sore throat and voice change.   Respiratory: Positive for chest tightness and shortness of breath. Negative for cough, choking, wheezing and stridor.   Cardiovascular: Negative.  Negative for chest pain, palpitations and leg swelling.  Gastrointestinal: Negative for abdominal distention, blood in stool, constipation, diarrhea, nausea and vomiting.  Genitourinary: Positive for decreased urine volume (Due to Renal disease). Negative for difficulty urinating, discharge, penile pain, penile swelling and testicular pain.  Musculoskeletal: Negative.  Negative for arthralgias.  Skin: Negative.   Neurological: Positive for dizziness and light-headedness (When changing positions).  Psychiatric/Behavioral: Positive for decreased concentration and sleep disturbance (Sleeps on 2 pillows at night with 5L [place]). Negative for agitation. The patient is not nervous/anxious and is not hyperactive.        Objective:   Physical Exam Vitals reviewed.  Constitutional:      General:  He is not in acute distress.    Appearance: He is not ill-appearing, toxic-appearing or diaphoretic.  Cardiovascular:     Rate and Rhythm: Normal rate. Rhythm irregular.     Heart sounds: S1 normal and S2 normal.  Pulmonary:     Effort: Pulmonary effort is normal. No tachypnea, bradypnea or respiratory distress.     Breath sounds: Normal breath sounds. No decreased breath sounds, wheezing, rhonchi or rales.  Abdominal:     Palpations: Abdomen is soft. There is no mass.     Tenderness: There is no abdominal tenderness. There is no rebound.  Musculoskeletal:     Right lower leg: No tenderness. No edema.     Left lower leg: No tenderness. No edema.  Skin:    General: Skin is dry.     Coloration: Skin is not cyanotic or pale.  Neurological:     Mental Status: He is oriented to person, place, and time. He is lethargic.  Psychiatric:        Attention and Perception: Attention normal.        Mood and Affect: Mood is depressed. Affect is flat.        Speech: Speech is delayed.        Behavior: Behavior is slowed and withdrawn. Behavior is not agitated, aggressive or hyperactive. Behavior is cooperative.        Thought Content: Thought content normal.        Judgment: Judgment normal.    Lab Results  Component Value Date   CREATININE 3.38 (H) 01/13/2020   CREATININE 4.64 (H) 01/12/2020   CREATININE 3.78 (H) 01/11/2020   Vitals with BMI 02/17/2020 01/13/2020 01/13/2020  Height 5\' 11"  - -  Weight 133 lbs - -  BMI 26.71 - -  Systolic 245 809 983  Diastolic 78 76 68  Pulse 74 86 93         Assessment & Plan:   1. Chronic heart failure with mildly reduced ejection fraction- -Patient is a NYHA class 3 - Echo on 12/07/19 EF noted to be 45-50% with severely elevated pulmonary artery systolic pressure. -Patient denies eating high salt content, wife reports she cooks for him regularly and does not cook with salt and he eats minimal. -Drinks 3 cans of diet sprite daily, no other fluid  intake -Therapeutic discussion about disease process had with both husband and wife. Explanation of heart failure and how it is normally managed discussed. Patient has other chronic conditions that make management difficult.  -Discussion of palliative care had,  wife requests they call her phone -Discussion of utilization of Paramedic program, wife again requests that she be called. -Patient utilizes 5L [place] at all times and during sleep  2. End Stage Renal Failure -Patient has dialysis Tuesdays, Thursdays and Saturdays. -Reports average removal of 2-4L of fluid per dialysis apt -Most recent BMP (01/13/2020) reviewed Sodium 136, Potassium 3.8, BUN 25, Creatinine 3.38, GFR 22  3. HTN -BP looks okay at this time -Will not increase Entresto due to dialysis  -Saw PCP ([facility]) 01/27/2020   Follow up PRN with any concerns. Paramedic consult and Palliative care consult placed.

## 2020-02-17 NOTE — Telephone Encounter (Signed)
Spoke with wife and set up initial consult for Friday 10/8 at 12:30 Curt Oatis K. Olena Heckle NP

## 2020-02-17 NOTE — Patient Instructions (Signed)
If you develop any swelling in the legs or have increased weight gain >2lb in a night or 5lb in 1 week call us. Kristi the paramedic will call you as well as palliative care.

## 2020-02-19 ENCOUNTER — Other Ambulatory Visit: Payer: Medicare Other | Admitting: Adult Health Nurse Practitioner

## 2020-02-19 ENCOUNTER — Other Ambulatory Visit: Payer: Self-pay

## 2020-02-19 DIAGNOSIS — Z515 Encounter for palliative care: Secondary | ICD-10-CM

## 2020-02-19 DIAGNOSIS — Z992 Dependence on renal dialysis: Secondary | ICD-10-CM

## 2020-02-19 DIAGNOSIS — N186 End stage renal disease: Secondary | ICD-10-CM

## 2020-02-19 NOTE — Progress Notes (Signed)
[place] Consult Note Telephone: [phone]  Fax: [phone]  PATIENT NAME: [name] DOB: [birth year] MRN: [id number]  PRIMARY CARE PROVIDER:   [name], MD  REFERRING PROVIDER:  [name], [place] [facility] [place],  [place] [address]  RESPONSIBLE PARTY:   Ashton Belote, wife [phone] Email: [email or web address]    RECOMMENDATIONS and PLAN:  1.  Advanced care planning.  Patient and wife are in agreement for full code and full hospital interventions.  Goal is to keep patient at home and to perform what measures need to be done to prolong life.  2.  Functional status.  Patient is very weak due to anemia.  He does ambulate with a walker.  Does require help with bathing.  He is able to dress himself and get ready for dialysis.  Patient is able to feed himself and states having a good appetite.  Though he has had an 8 pound weight loss over the past 5 months.  In May he weighed 141 pounds with BMI of 19.67.  Currently weighs 133 pounds with BMI of 18.55.  Patient is continent of bowel and bladder.  Patient is currently working with  [employer] with PT.  Continue working with PT as ordered  3.  Support.  Wife is primary caregiver and still works full-time.  Patient and wife do not have any children to help out.  Wife does state that his sister comes and helps with lunch.  He is currently getting services through [facility] which include an aide/PT/RN.  Wife does have concerns that once this ends that they may not be able to get the help they need in the home.  Patient does not qualify for Medicaid and they are unable to pay out-of-pocket for aids to come into the home.  Have given wife information for [employer] and have put in a social work referral.  Palliative will continue to monitor for symptom management/decline and make recommendations as needed.  Next  appointment is in 6 weeks.  Encouraged to call with any questions or concerns.  I spent 80 minutes providing this consultation,  from 12:30 to 1:50 including time spent with patient/family, chart review, provider coordination, documentation. More than 50% of the time in this consultation was spent coordinating communication.   HISTORY OF PRESENT ILLNESS:  [name] is a 59 y.o. year old male with multiple medical problems including ESRD on HD T,TH,SAT; DMT 2, CVA with right-sided weakness, HTN, HLD, chronic bilateral ICA occlusion. Palliative Care was asked to help address goals of care.  Patient had hospitalization for 12 through 08/25/2019 for hypoglycemia and was seen again on 09/16/2019 in the ED for hyperosmolar hyperglycemia.  On 7/25 through 12/13/2019 he was in the hospital for acute respiratory failure with hypoxia, PE and also during this hospitalization had left second toe amputation due to diabetic foot infection.  8/12 through 01/04/2020 patient was hospitalized for symptomatic anemia and aspiration pneumonia.  Patient did require blood transfusion.  Endoscopy did not show any source of bleeding.  Patient was hospitalized 8/28 through 01/13/2020 for acute on chronic respiratory failure secondary to fluid overload and CHF exacerbation.  Patient has permacath to right chest for dialysis 3 days a week.  Patient does receive Epoetin Alfa during dialysis.  Patient states he still makes urine.  Patient endorses having weakness and shortness of breath with activity.  Wife states that she has noticed forgetfulness and confusion  with him.  Is concerned that once home health services and not having someone there while she is at work to monitor him to make sure he gets his meals and his medications and a bath (see above for SW referral).  Patient also recently found to have glaucoma in both eyes but is unable to have surgery due to his A1c being too high.  Denies pain, headaches, cough, fever, N/V, constipation,  hematuria, dysuria, hematochezia, melena.  CODE STATUS: Full code  PPS: 40% HOSPICE ELIGIBILITY/DIAGNOSIS: TBD  PHYSICAL EXAM:  BP 136/72  HR 85  O2 96% on 5L General: NAD, frail appearing, thin Cardiovascular: regular rate and rhythm Pulmonary: lung sounds clear; normal respiratory effort Abdomen: soft, nontender, + bowel sounds GU: no suprapubic tenderness Extremities: no edema, no joint deformities Skin: no rashes on exposed skin Neurological: Weakness but otherwise nonfocal; does have occasional forgetfulness and confusion   PAST MEDICAL HISTORY:  Past Medical History:  Diagnosis Date   Chronic kidney disease    Diabetes mellitus without complication (HCC)    ETOH abuse    GERD (gastroesophageal reflux disease)    Hyperlipidemia    Hypertension    Stroke ([place])     SOCIAL HX:  Social History   Tobacco Use   Smoking status: Current Every Day Smoker    Packs/day: 0.25    Years: 5.00    Pack years: 1.25    Types: Cigars   Smokeless tobacco: Never Used  Substance Use Topics   Alcohol use: Yes    Alcohol/week: 16.0 standard drinks    Types: 14 Cans of beer, 2 Shots of liquor per week    ALLERGIES:  Allergies  Allergen Reactions   Ferrous Gluconate Nausea And Vomiting   Other      PERTINENT MEDICATIONS:  Outpatient Encounter Medications as of 02/19/2020  Medication Sig   aspirin EC 81 MG EC tablet Take 1 tablet (81 mg total) by mouth daily.   carvedilol (COREG) 6.25 MG tablet Take 6.25 mg by mouth 2 (two) times daily with a meal.    ENTRESTO 24-26 MG Take 1 tablet by mouth 2 (two) times daily.   epoetin alfa (EPOGEN) 10000 UNIT/ML injection Inject 0.4 mLs (4,000 Units total) into the vein Every Tuesday,Thursday,and Saturday with dialysis.   folic acid (FOLVITE) 1 MG tablet Take 1 mg by mouth daily.   hydrALAZINE (APRESOLINE) 25 MG tablet Take 1 tablet (25 mg total) by mouth in the morning and at bedtime.   insulin glargine (LANTUS) 100  UNIT/ML injection Inject 0.04 mLs (4 Units total) into the skin daily.   insulin lispro (HUMALOG KWIKPEN) 100 UNIT/ML KwikPen Inject 0.03 mLs (3 Units total) into the skin 3 (three) times daily with meals.   iron polysaccharides (NIFEREX) 150 MG capsule Take 1 capsule (150 mg total) by mouth daily.   Melatonin 3 MG TABS Take 1 tablet by mouth at bedtime.   multivitamin (RENA-VIT) TABS tablet Take 1 tablet by mouth daily.   nicotine (NICODERM CQ - DOSED IN MG/24 HR) 7 mg/24hr patch Place 1 patch (7 mg total) onto the skin daily.   Nutritional Supplements (FEEDING SUPPLEMENT, NEPRO CARB STEADY,) LIQD Take 237 mLs by mouth 3 (three) times daily between meals.   sertraline (ZOLOFT) 100 MG tablet Take 100 mg by mouth daily.    thiamine (VITAMIN B-1) 100 MG tablet Take 100 mg by mouth daily.   No facility-administered encounter medications on file as of 02/19/2020.     Ryliegh Mcduffey Jenetta Downer,  NP

## 2020-02-24 ENCOUNTER — Encounter (HOSPITAL_COMMUNITY): Payer: Self-pay

## 2020-02-24 ENCOUNTER — Other Ambulatory Visit (HOSPITAL_COMMUNITY): Payer: Self-pay

## 2020-02-24 NOTE — Progress Notes (Signed)
Today had a first home visit with [name] and wife.  He was laying in bed not wanting to talk much.  He states was resting.  Talked mostly with wife, she states she works and cares for him.  He does dialysis 3 times a week, she tries to get him to watch his fluid consumption, carbs and high sodium foods but he will sneak them when she is at work and get family members to bring him fast food and drinks. Attempted to discuss fluids with [name] but did not seem interested.  He has all his medications but his lantus.  He has been out of for 2 days, she is picking it up tonight.  His blood sugar was elevated last night but down to 186 this morning.  Looking at his chart he has some high reading in 400's and 500's about once a week.  She states she takes her book where she records his readings to his doctor appts.  She does know how to check his sugar and knows what to do when low.  She states she gets overwhelmed trying to keep up with herself and him with all his medical problems.  She crushes his medications because he does not like to swallow them.  She states he does not like to do much for his self when he is able.  Advised her if any problems where he appears to be short of breath or retaining fluid please call, I can come by and check on him.  Any problems with his medications where its not affordable or having problems getting refills to call.  They have everything for daily living.  She states she really just needs support to take off some of stress.  Palliative care is involved now.  Will help anyone Im able to.  Will visit for heart failure, medication management and diet.   [place] [phone]

## 2020-02-25 ENCOUNTER — Telehealth (INDEPENDENT_AMBULATORY_CARE_PROVIDER_SITE_OTHER): Payer: Self-pay

## 2020-02-25 ENCOUNTER — Telehealth (HOSPITAL_COMMUNITY): Payer: Self-pay

## 2020-02-25 NOTE — Telephone Encounter (Signed)
I attempted to contact the patient's wife and a message was left for a return call.

## 2020-02-25 NOTE — Telephone Encounter (Signed)
I had a voice mail when I arrived at work today stating [name] had diarrhea and did not go to dialysis today.  [name] stated that if I wanted to go by and check on him to call her back and she would unlock the door.  Attempted to call her back and left message with no response back yet.    [place] [phone]

## 2020-03-01 ENCOUNTER — Telehealth (INDEPENDENT_AMBULATORY_CARE_PROVIDER_SITE_OTHER): Payer: Self-pay

## 2020-03-01 NOTE — Telephone Encounter (Signed)
I have attempted to contact the patient's wife to get him scheduled for a permcath removal as requested per the wife. I have left messages for a return call and have not received a call back. I will fax the appt to the patient's dialysis center at [facility] for a permcath removal with Dr. Lucky Cowboy on 03/07/20 with a 10:45 am arrival time to the MM. Covid testing on 03/03/20 between 8-1 pm at the [facility]. Pre-procedure instructions will be faxed to [facility] on [address].

## 2020-03-03 ENCOUNTER — Other Ambulatory Visit: Payer: Self-pay

## 2020-03-03 ENCOUNTER — Other Ambulatory Visit
Admission: RE | Admit: 2020-03-03 | Discharge: 2020-03-03 | Disposition: A | Discharge status: Home / Self Care | Payer: Medicare Other | Source: Ambulatory Visit | Attending: Vascular Surgery | Admitting: Vascular Surgery

## 2020-03-03 DIAGNOSIS — Z01812 Encounter for preprocedural laboratory examination: Secondary | ICD-10-CM | POA: Insufficient documentation

## 2020-03-03 DIAGNOSIS — Z20822 Contact with and (suspected) exposure to covid-19: Secondary | ICD-10-CM | POA: Diagnosis not present

## 2020-03-03 NOTE — Telephone Encounter (Signed)
Patient's wife called wanting to be sure of the information regarding the letter she received for the patient's permcath removal. I explained the paperwork and she understood.

## 2020-03-04 LAB — SARS CORONAVIRUS 2 (TAT 6-24 HRS): SARS Coronavirus 2: NEGATIVE

## 2020-03-06 ENCOUNTER — Other Ambulatory Visit (INDEPENDENT_AMBULATORY_CARE_PROVIDER_SITE_OTHER): Payer: Self-pay | Admitting: Nurse Practitioner

## 2020-03-07 ENCOUNTER — Ambulatory Visit
Admission: RE | Admit: 2020-03-07 | Discharge: 2020-03-07 | Disposition: A | Discharge status: Home / Self Care | Payer: Medicare Other | Attending: Vascular Surgery | Admitting: Vascular Surgery

## 2020-03-07 ENCOUNTER — Other Ambulatory Visit: Payer: Self-pay

## 2020-03-07 ENCOUNTER — Encounter: Payer: Self-pay | Admitting: Vascular Surgery

## 2020-03-07 ENCOUNTER — Encounter
Admission: RE | Disposition: A | Discharge status: Home / Self Care | Payer: Self-pay | Source: Home / Self Care | Attending: Vascular Surgery

## 2020-03-07 DIAGNOSIS — Z794 Long term (current) use of insulin: Secondary | ICD-10-CM | POA: Insufficient documentation

## 2020-03-07 DIAGNOSIS — I120 Hypertensive chronic kidney disease with stage 5 chronic kidney disease or end stage renal disease: Secondary | ICD-10-CM | POA: Insufficient documentation

## 2020-03-07 DIAGNOSIS — I2510 Atherosclerotic heart disease of native coronary artery without angina pectoris: Secondary | ICD-10-CM | POA: Insufficient documentation

## 2020-03-07 DIAGNOSIS — F17210 Nicotine dependence, cigarettes, uncomplicated: Secondary | ICD-10-CM | POA: Diagnosis not present

## 2020-03-07 DIAGNOSIS — Z992 Dependence on renal dialysis: Secondary | ICD-10-CM | POA: Diagnosis not present

## 2020-03-07 DIAGNOSIS — Z7982 Long term (current) use of aspirin: Secondary | ICD-10-CM | POA: Insufficient documentation

## 2020-03-07 DIAGNOSIS — E1122 Type 2 diabetes mellitus with diabetic chronic kidney disease: Secondary | ICD-10-CM | POA: Diagnosis not present

## 2020-03-07 DIAGNOSIS — Z8673 Personal history of transient ischemic attack (TIA), and cerebral infarction without residual deficits: Secondary | ICD-10-CM | POA: Diagnosis not present

## 2020-03-07 DIAGNOSIS — N186 End stage renal disease: Secondary | ICD-10-CM

## 2020-03-07 DIAGNOSIS — E785 Hyperlipidemia, unspecified: Secondary | ICD-10-CM | POA: Diagnosis not present

## 2020-03-07 DIAGNOSIS — Z79899 Other long term (current) drug therapy: Secondary | ICD-10-CM | POA: Insufficient documentation

## 2020-03-07 DIAGNOSIS — Z4901 Encounter for fitting and adjustment of extracorporeal dialysis catheter: Secondary | ICD-10-CM | POA: Insufficient documentation

## 2020-03-07 DIAGNOSIS — K219 Gastro-esophageal reflux disease without esophagitis: Secondary | ICD-10-CM | POA: Insufficient documentation

## 2020-03-07 HISTORY — PX: DIALYSIS/PERMA CATHETER REMOVAL: CATH118289

## 2020-03-07 LAB — GLUCOSE, CAPILLARY: Glucose-Capillary: 65 mg/dL — ABNORMAL LOW (ref 70–99)

## 2020-03-07 SURGERY — DIALYSIS/PERMA CATHETER REMOVAL
Anesthesia: LOCAL

## 2020-03-07 MED ORDER — LIDOCAINE-EPINEPHRINE (PF) 1 %-1:200000 IJ SOLN
INTRAMUSCULAR | Status: DC | PRN
  Administered 2020-03-07: 10 mL via INTRADERMAL

## 2020-03-07 SURGICAL SUPPLY — 2 items
FORCEPS HALSTEAD CVD 5IN STRL (INSTRUMENTS) ×2 IMPLANT
TRAY LACERAT/PLASTIC (MISCELLANEOUS) ×2 IMPLANT

## 2020-03-07 NOTE — H&P (Signed)
[facility] Admission History & Physical  MRN : [id number]  [name] is a 59 y.o. ([birth year]) male who presents with chief complaint of scheduled PermCath removal.  History of Present Illness:  I am asked to evaluate the patient by the dialysis center. The patient was sent here because they have a functioning  upper extremity dialysis access.  The patient reports there has not been any problems with dialysis runs. They are reporting good flows with good parameters at dialysis. Patient denies pain or tenderness overlying the access.  There is no pain with dialysis.  The patient denies hand pain or finger pain consistent with steal syndrome.  No fevers or chills while on dialysis  No current facility-administered medications for this encounter.   Current Outpatient Medications  Medication Sig Dispense Refill  . aspirin EC 81 MG EC tablet Take 1 tablet (81 mg total) by mouth daily.    . carvedilol (COREG) 6.25 MG tablet Take 6.25 mg by mouth 2 (two) times daily with a meal.     . ENTRESTO 24-26 MG Take 1 tablet by mouth 2 (two) times daily.    [name] epoetin alfa (EPOGEN) 10000 UNIT/ML injection Inject 0.4 mLs (4,000 Units total) into the vein Every Tuesday,Thursday,and Saturday with dialysis. 1 mL   . folic acid (FOLVITE) 1 MG tablet Take 1 mg by mouth daily.    . hydrALAZINE (APRESOLINE) 25 MG tablet Take 1 tablet (25 mg total) by mouth in the morning and at bedtime.    . insulin glargine (LANTUS) 100 UNIT/ML injection Inject 0.04 mLs (4 Units total) into the skin daily.    . insulin lispro (HUMALOG KWIKPEN) 100 UNIT/ML KwikPen Inject 0.03 mLs (3 Units total) into the skin 3 (three) times daily with meals.    . iron polysaccharides (NIFEREX) 150 MG capsule Take 1 capsule (150 mg total) by mouth daily. 30 capsule 2  . Melatonin 3 MG TABS Take 1 tablet by mouth at bedtime.    . multivitamin (RENA-VIT) TABS tablet Take 1 tablet by mouth daily.    . nicotine (NICODERM  CQ - DOSED IN MG/24 HR) 7 mg/24hr patch Place 1 patch (7 mg total) onto the skin daily. 30 patch 2  . sertraline (ZOLOFT) 100 MG tablet Take 100 mg by mouth daily.     . Nutritional Supplements (FEEDING SUPPLEMENT, NEPRO CARB STEADY,) LIQD Take 237 mLs by mouth 3 (three) times daily between meals. (Patient not taking: Reported on 03/07/2020)  0  . thiamine (VITAMIN B-1) 100 MG tablet Take 100 mg by mouth daily. (Patient not taking: Reported on 03/07/2020)     Past Medical History:  Diagnosis Date  . Chronic kidney disease   . Diabetes mellitus without complication ([place])   . ETOH abuse   . GERD (gastroesophageal reflux disease)   . Hyperlipidemia   . Hypertension   . Stroke [facility])    Past Surgical History:  Procedure Laterality Date  . AMPUTATION TOE Left 12/11/2019   Procedure: AMPUTATION LEFT 2nd TOE;  Surgeon: Sharlotte Alamo, DPM;  Location: [facility] ORS;  Service: Podiatry;  Laterality: Left;  . AV FISTULA PLACEMENT Right 11/25/2019   Procedure: INSERTION OF ARTERIOVENOUS (AV) GORE-TEX GRAFT ARM;  Surgeon: Katha Cabal, MD;  Location: [facility] ORS;  Service: Vascular;  Laterality: Right;  . COLONOSCOPY WITH PROPOFOL N/A 03/06/2019   Procedure: COLONOSCOPY WITH PROPOFOL;  Surgeon: Lucilla Lame, MD;  Location: [facility] ENDOSCOPY;  Service: Endoscopy;  Laterality: N/A;  . DIALYSIS/PERMA CATHETER  INSERTION N/A 04/10/2019   Procedure: DIALYSIS/PERMA CATHETER INSERTION;  Surgeon: Serafina Mitchell, MD;  Location: [facility] CV LAB;  Service: Cardiovascular;  Laterality: N/A;  . ESOPHAGOGASTRODUODENOSCOPY (EGD) WITH PROPOFOL N/A 07/01/2016   Procedure: ESOPHAGOGASTRODUODENOSCOPY (EGD) WITH PROPOFOL;  Surgeon: San Jetty, MD;  Location: [facility] ENDOSCOPY;  Service: General;  Laterality: N/A;  . ESOPHAGOGASTRODUODENOSCOPY (EGD) WITH PROPOFOL N/A 12/25/2019   Procedure: ESOPHAGOGASTRODUODENOSCOPY (EGD) WITH PROPOFOL;  Surgeon: Lin Landsman, MD;  Location: [facility] ENDOSCOPY;  Service: Gastroenterology;   Laterality: N/A;  . LOWER EXTREMITY ANGIOGRAPHY Left 12/10/2019   Procedure: Lower Extremity Angiography;  Surgeon: Algernon Huxley, MD;  Location: [facility];  Service: Cardiovascular;  Laterality: Left;   Social History Social History   Tobacco Use  . Smoking status: Current Every Day Smoker    Packs/day: 0.25    Years: 5.00    Pack years: 1.25    Types: Cigars  . Smokeless tobacco: Never Used  Vaping Use  . Vaping Use: Never used  Substance Use Topics  . Alcohol use: Yes    Alcohol/week: 16.0 standard drinks    Types: 14 Cans of beer, 2 Shots of liquor per week  . Drug use: No   Family History Family History  Problem Relation Age of Onset  . CAD Brother   . Dementia Mother   . Renal Disease Father   No family history of bleeding or clotting disorders, autoimmune disease or porphyria  Allergies  Allergen Reactions  . Ferrous Gluconate Nausea And Vomiting  . Other    REVIEW OF SYSTEMS (Negative unless checked)  Constitutional: [] Weight loss  [] Fever  [] Chills Cardiac: [] Chest pain   [] Chest pressure   [] Palpitations   [] Shortness of breath when laying flat   [] Shortness of breath at rest   [x] Shortness of breath with exertion. Vascular:  [] Pain in legs with walking   [] Pain in legs at rest   [] Pain in legs when laying flat   [] Claudication   [] Pain in feet when walking  [] Pain in feet at rest  [] Pain in feet when laying flat   [] History of DVT   [] Phlebitis   [] Swelling in legs   [] Varicose veins   [] Non-healing ulcers Pulmonary:   [] Uses home oxygen   [] Productive cough   [] Hemoptysis   [] Wheeze  [] COPD   [] Asthma Neurologic:  [] Dizziness  [] Blackouts   [] Seizures   [] History of stroke   [] History of TIA  [] Aphasia   [] Temporary blindness   [] Dysphagia   [] Weakness or numbness in arms   [] Weakness or numbness in legs Musculoskeletal:  [] Arthritis   [] Joint swelling   [] Joint pain   [] Low back pain Hematologic:  [] Easy bruising  [] Easy bleeding   [] Hypercoagulable  state   [x] Anemic  [] Hepatitis Gastrointestinal:  [] Blood in stool   [] Vomiting blood  [] Gastroesophageal reflux/heartburn   [] Difficulty swallowing. Genitourinary:  [x] Chronic kidney disease   [] Difficult urination  [] Frequent urination  [] Burning with urination   [] Blood in urine Skin:  [] Rashes   [] Ulcers   [] Wounds Psychological:  [] History of anxiety   []  History of major depression.  Physical Examination  Vitals:   03/07/20 1136 03/07/20 1236  BP: 126/84 109/71  Pulse: 78 68  Resp: 17 18  Temp: 97.9 F (36.6 C)   TempSrc: Oral   SpO2: 93% 94%  Weight: 60.3 kg   Height: 5\' 11"  (1.803 m)    Body mass index is 18.55 kg/m. Gen: WD/WN, NAD Head: [place]/AT, No temporalis wasting. Prominent  temp pulse not noted. Ear/Nose/Throat: Hearing grossly intact, nares w/o erythema or drainage, oropharynx w/o Erythema/Exudate,  Eyes: Conjunctiva clear, sclera non-icteric Neck: Trachea midline.  No JVD.  Pulmonary:  Good air movement, respirations not labored, no use of accessory muscles.  Cardiac: RRR, normal S1, S2. Vascular:  Vessel Right Left  Radial Palpable Palpable  Ulnar Not Palpable Not Palpable  Brachial Palpable Palpable  Carotid Palpable, without bruit Palpable, without bruit  Gastrointestinal: soft, non-tender/non-distended. No guarding/reflex.  Musculoskeletal: M/S 5/5 throughout.  Extremities without ischemic changes.  No deformity or atrophy.  Neurologic: Sensation grossly intact in extremities.  Symmetrical.  Speech is fluent. Motor exam as listed above. Psychiatric: Judgment intact, Mood & affect appropriate for pt's clinical situation. Dermatologic: No rashes or ulcers noted.  No cellulitis or open wounds. Lymph : No Cervical, Axillary, or Inguinal lymphadenopathy.  CBC Lab Results  Component Value Date   WBC 3.0 (L) 01/13/2020   HGB 11.6 (L) 01/13/2020   HCT 35.7 (L) 01/13/2020   MCV 93.9 01/13/2020   PLT 149 (L) 01/13/2020   BMET    Component Value Date/Time    NA 136 01/13/2020 0712   NA 143 01/20/2014 0443   K 3.8 01/13/2020 0712   K 4.6 01/20/2014 0443   CL 99 01/13/2020 0712   CL 116 (H) 01/20/2014 0443   CO2 28 01/13/2020 0712   CO2 22 01/20/2014 0443   GLUCOSE 218 (H) 01/13/2020 0712   GLUCOSE 94 01/20/2014 0443   BUN 25 (H) 01/13/2020 0712   BUN 18 01/20/2014 0443   CREATININE 3.38 (H) 01/13/2020 0712   CREATININE 1.32 (H) 01/20/2014 0443   CALCIUM 7.9 (L) 01/13/2020 0712   CALCIUM 7.3 (L) 01/20/2014 0443   GFRNONAA 19 (L) 01/13/2020 0712   GFRNONAA >60 01/20/2014 0443   GFRAA 22 (L) 01/13/2020 0712   GFRAA >60 01/20/2014 0443   CrCl cannot be calculated (Patient's most recent lab result is older than the maximum 21 days allowed.).  COAG Lab Results  Component Value Date   INR 1.1 11/25/2019   INR 1.0 07/17/2019   INR 0.93 02/26/2018   Radiology PERIPHERAL VASCULAR CATHETERIZATION  Result Date: 03/07/2020 See op note  Assessment/Plan  1.  End-stage renal disease requiring hemodialysis:   The patient presents today for a scheduled PermCath removal.  At this time, patient has a functioning upper extremity dialysis access and is in no longer in need of his PermCath.  Plan for removal. Procedure, risks and benefits were found to the patient.  All questions were answered.  Patient was to proceed.  Patient will continue dialysis therapy without further interruption if a successful intervention is not achieved then a tunneled catheter will be placed. Dialysis has already been arranged.  2.  Hypertension:  Patient will continue medical management; nephrology is following no changes in oral medications.  3. Diabetes mellitus:  Glucose will be monitored and oral medications been held this morning once the patient has undergone the patient's procedure po intake will be reinitiated and again Accu-Cheks will be used to assess the blood glucose level and treat as needed. The patient will be restarted on the patient's usual  hypoglycemic regime  4.  Coronary artery disease:  EKG will be monitored. Nitrates will be used if needed. The patient's oral cardiac medications will be continued.  Discussed with Dr. Mayme Genta, PA-C  03/07/2020 3:24 PM

## 2020-03-07 NOTE — Progress Notes (Signed)
PT alert/oriented and signed consent.  Pt unable to answer questions regarding mediations.  Pt wife at bedside and completed mediation review.    FSBS taken pre procedure was 65.

## 2020-03-07 NOTE — Discharge Instructions (Signed)
Tunneled Catheter Removal, Care After Refer to this sheet in the next few weeks. These instructions provide you with information about caring for yourself after your procedure. Your health care provider may also give you more specific instructions. Your treatment has been planned according to current medical practices, but problems sometimes occur. Call your health care provider if you have any problems or questions after your procedure. What can I expect after the procedure? After the procedure, it is common to have: Some mild redness, swelling, and pain around your catheter site.   Follow these instructions at home: Incision care  Check your removal site  every day for signs of infection. Check for: More redness, swelling, or pain. More fluid or blood. Warmth. Pus or a bad smell. Remove your dressing in 48hrs leave open to air  Activity  Return to your normal activities as told by your health care provider. Ask your health care provider what activities are safe for you. Do not lift anything that is heavier than 10 lb (4.5 kg) for 3 days  You may shower tomorrow  Contact a health care provider if: You have more fluid or blood coming from your removal site You have more redness, swelling, or pain at your incisions or around the area where your catheter was removed Your removal site feel warm to the touch. You feel unusually weak. You feel nauseous.. Get help right away if You have swelling in your arm, shoulder, neck, or face. You develop chest pain. You have difficulty breathing. You feel dizzy or light-headed. You have pus or a bad smell coming from your removal site You have a fever. You develop bleeding from your removal site, and your bleeding does not stop. This information is not intended to replace advice given to you by your health care provider. Make sure you discuss any questions you have with your health care provider. Document Released: 04/16/2012 Document Revised:  01/01/2016 Document Reviewed: 01/24/2015 Elsevier Interactive Patient Education  2017 Elsevier Inc. 

## 2020-03-07 NOTE — Op Note (Signed)
Operative Note  Preoperative diagnosis:    1. ESRD with functional permanent access  Postoperative diagnosis:   1. ESRD with functional permanent access  Procedure:  Removal of RIGHT Permcath  Physician Assistant: Hezzie Bump PA-C  Surgeon:  Leotis Pain, MD  Anesthesia:  Local  EBL:  Minimal  Indication for the Procedure:  The patient has a functional permanent dialysis access and no longer needs their permcath.  This can be removed.  Risks and benefits are discussed and informed consent is obtained.  Description of the Procedure:  The patient's right neck, chest and existing catheter were sterilely prepped and draped. The area around the catheter was anesthetized copiously with 1% lidocaine. The catheter was dissected out with curved hemostats until the cuff was freed from the surrounding fibrous sheath. The fiber sheath was transected, and the catheter was then removed in its entirety using gentle traction. Pressure was held and sterile dressings were placed. The patient tolerated the procedure well and was taken to the recovery room in stable condition.  [name]  03/07/2020, 3:28 PM  This note was created with Dragon Medical transcription system. Any errors in dictation are purely unintentional.

## 2020-03-10 ENCOUNTER — Encounter: Payer: Self-pay | Admitting: Emergency Medicine

## 2020-03-10 ENCOUNTER — Other Ambulatory Visit: Payer: Self-pay

## 2020-03-10 ENCOUNTER — Emergency Department
Admission: EM | Admit: 2020-03-10 | Discharge: 2020-03-10 | Disposition: A | Discharge status: Home / Self Care | Payer: Medicare Other | Attending: Emergency Medicine | Admitting: Emergency Medicine

## 2020-03-10 DIAGNOSIS — N184 Chronic kidney disease, stage 4 (severe): Secondary | ICD-10-CM | POA: Diagnosis not present

## 2020-03-10 DIAGNOSIS — E11649 Type 2 diabetes mellitus with hypoglycemia without coma: Secondary | ICD-10-CM | POA: Insufficient documentation

## 2020-03-10 DIAGNOSIS — I5023 Acute on chronic systolic (congestive) heart failure: Secondary | ICD-10-CM | POA: Insufficient documentation

## 2020-03-10 DIAGNOSIS — D649 Anemia, unspecified: Secondary | ICD-10-CM

## 2020-03-10 DIAGNOSIS — F17290 Nicotine dependence, other tobacco product, uncomplicated: Secondary | ICD-10-CM | POA: Diagnosis not present

## 2020-03-10 DIAGNOSIS — Z7982 Long term (current) use of aspirin: Secondary | ICD-10-CM | POA: Diagnosis not present

## 2020-03-10 DIAGNOSIS — I132 Hypertensive heart and chronic kidney disease with heart failure and with stage 5 chronic kidney disease, or end stage renal disease: Secondary | ICD-10-CM | POA: Insufficient documentation

## 2020-03-10 DIAGNOSIS — N186 End stage renal disease: Secondary | ICD-10-CM

## 2020-03-10 DIAGNOSIS — Z794 Long term (current) use of insulin: Secondary | ICD-10-CM | POA: Insufficient documentation

## 2020-03-10 DIAGNOSIS — E1110 Type 2 diabetes mellitus with ketoacidosis without coma: Secondary | ICD-10-CM | POA: Diagnosis not present

## 2020-03-10 DIAGNOSIS — Z992 Dependence on renal dialysis: Secondary | ICD-10-CM | POA: Insufficient documentation

## 2020-03-10 DIAGNOSIS — Z79899 Other long term (current) drug therapy: Secondary | ICD-10-CM | POA: Insufficient documentation

## 2020-03-10 LAB — COMPREHENSIVE METABOLIC PANEL
ALT: 35 U/L (ref 0–44)
AST: 37 U/L (ref 15–41)
Albumin: 3.3 g/dL — ABNORMAL LOW (ref 3.5–5.0)
Alkaline Phosphatase: 171 U/L — ABNORMAL HIGH (ref 38–126)
Anion gap: 13 (ref 5–15)
BUN: 20 mg/dL (ref 6–20)
CO2: 28 mmol/L (ref 22–32)
Calcium: 8.4 mg/dL — ABNORMAL LOW (ref 8.9–10.3)
Chloride: 97 mmol/L — ABNORMAL LOW (ref 98–111)
Creatinine, Ser: 2.43 mg/dL — ABNORMAL HIGH (ref 0.61–1.24)
GFR, Estimated: 30 mL/min — ABNORMAL LOW (ref >60)
Glucose, Bld: 245 mg/dL — ABNORMAL HIGH (ref 70–99)
Potassium: 3.5 mmol/L (ref 3.5–5.1)
Sodium: 138 mmol/L (ref 135–145)
Total Bilirubin: 1 mg/dL (ref 0.3–1.2)
Total Protein: 7.1 g/dL (ref 6.5–8.1)

## 2020-03-10 LAB — CBC
HCT: 25.8 % — ABNORMAL LOW (ref 39.0–52.0)
Hemoglobin: 7.6 g/dL — ABNORMAL LOW (ref 13.0–17.0)
MCH: 27 pg (ref 26.0–34.0)
MCHC: 29.5 g/dL — ABNORMAL LOW (ref 30.0–36.0)
MCV: 91.8 fL (ref 80.0–100.0)
Platelets: 226 10*3/uL (ref 150–400)
RBC: 2.81 MIL/uL — ABNORMAL LOW (ref 4.22–5.81)
RDW: 15.7 % — ABNORMAL HIGH (ref 11.5–15.5)
WBC: 6.2 10*3/uL (ref 4.0–10.5)
nRBC: 0 % (ref 0.0–0.2)

## 2020-03-10 LAB — PREPARE RBC (CROSSMATCH)

## 2020-03-10 MED ORDER — SODIUM CHLORIDE 0.9 % IV SOLN
10.0000 mL/h | Freq: Once | INTRAVENOUS | Status: AC
  Administered 2020-03-10: 10 mL/h via INTRAVENOUS

## 2020-03-10 NOTE — ED Notes (Signed)
Pt to room via wc. Pt awake and alert. Pt requesting a warm blanket and a phone so he could call his wife to bring him lunch. Pt denies any CP/SOB/ or dizziness

## 2020-03-10 NOTE — ED Provider Notes (Addendum)
[facility] ([place]) Emergency Department Provider Note  ____________________________________________  Time seen: Approximately 2:16 PM  I have reviewed the triage vital signs and the nursing notes.   HISTORY  Chief Complaint Abnormal Lab    HPI [name] is a 59 y.o. male with a history of end-stage renal disease on hemodialysis, diabetes, hypertension, chronic anemia who is sent to the ED due to a hemoglobin of 6.7 at dialysis.  Patient had his usual scheduled dialysis this morning, completed the whole session when his third up he became dizzy. He notes that he has been having increased fatigue and shortness of breath with ambulation at home. Denies acute orthostatic symptoms, no palpitations or syncope. Symptoms are gradual onset constant no alleviating factors      Past Medical History:  Diagnosis Date  . Chronic kidney disease   . Diabetes mellitus without complication ([place])   . ETOH abuse   . GERD (gastroesophageal reflux disease)   . Hyperlipidemia   . Hypertension   . Stroke [facility])      Patient Active Problem List   Diagnosis Date Noted  . Acute on chronic systolic CHF (congestive heart failure) ([place]) 01/10/2020  . Severe mitral regurgitation 01/10/2020  . Acute on chronic congestive heart failure ([place])   . Acute on chronic respiratory failure with hypoxemia ([place]) 12/29/2019  . Aspiration pneumonia of both lower lobes due to gastric secretions ([place]) 12/29/2019  . Diabetic foot infection ([place]) 12/07/2019  . Acute pulmonary edema ([place]) 12/07/2019  . Paronychia of second toe of left foot 11/09/2019  . Chronic osteomyelitis of toe of left foot ([place]) 11/09/2019  . DM (diabetes mellitus), type 2 with complications ([place]) 98/33/8250  . Hypoglycemia associated with diabetes ([place]) 08/24/2019  . Acute respiratory failure with hypoxia ([place])   . Acute metabolic encephalopathy 53/97/6734  . Hypertensive emergency 07/06/2019  . Acute encephalopathy  07/06/2019  . Seizures due to metabolic disorder ([place]) 19/37/9024  . Hyperosmolar hyperglycemic state (HHS) ([place]) 06/05/2019  . Symptomatic anemia 06/04/2019  . Frequent falls 06/04/2019  . History of CVA (cerebrovascular accident) 06/04/2019  . HCAP (healthcare-associated pneumonia) 06/04/2019  . Acute blood loss anemia 06/04/2019  . Hyperglycemia 05/28/2019  . Fall at home, initial encounter 05/28/2019  . Rib fracture 05/28/2019  . Depression 05/28/2019  . Hypokalemia 05/28/2019  . Weakness 05/27/2019  . ESRD on dialysis ([place])   . Acute renal failure (ARF) ([place]) 04/09/2019  . Polyp of ascending colon   . Diarrhea   . AKI (acute kidney injury) ([place]) 03/04/2019  . Acute kidney injury ([place]) 07/26/2018  . ARF (acute renal failure) ([place]) 07/25/2018  . Bilateral leg numbness 03/19/2018  . Numbness and tingling of both feet 03/19/2018  . CVA (cerebral vascular accident) ([place]) 03/17/2018  . Moderate episode of recurrent major depressive disorder ([place]) 03/11/2018  . UTI (urinary tract infection) 02/27/2018  . Hypoglycemia 01/15/2018  . Type 2 diabetes mellitus without complication, with long-term current use of insulin ([place]) 08/27/2017  . Protein-calorie malnutrition, severe 08/19/2017  . Pancreatitis, acute 08/16/2017  . DKA (diabetic ketoacidoses) 08/16/2017  . HTN (hypertension) 08/16/2017  . HLD (hyperlipidemia) 08/16/2017  . Carotid stenosis 08/02/2016  . GERD (gastroesophageal reflux disease) 08/02/2016  . History of esophagogastroduodenoscopy (EGD) 07/01/2016  . History of recent blood transfusion 07/01/2016  . Acute renal failure superimposed on stage 4 chronic kidney disease ([place]) 07/01/2016  . Hyponatremia 07/01/2016  . Uncontrolled diabetes mellitus ([place]) 07/01/2016  . Alcohol abuse 07/01/2016  . Monilial esophagitis ([place])  07/01/2016  . Tobacco abuse 07/01/2016  . Confusion 06/29/2016  . Iron deficiency anemia 06/29/2016  . Coagulopathy ([place]) 06/29/2016     Past  Surgical History:  Procedure Laterality Date  . AMPUTATION TOE Left 12/11/2019   Procedure: AMPUTATION LEFT 2nd TOE;  Surgeon: Sharlotte Alamo, DPM;  Location: [facility] ORS;  Service: Podiatry;  Laterality: Left;  . AV FISTULA PLACEMENT Right 11/25/2019   Procedure: INSERTION OF ARTERIOVENOUS (AV) GORE-TEX GRAFT ARM;  Surgeon: Katha Cabal, MD;  Location: [facility] ORS;  Service: Vascular;  Laterality: Right;  . COLONOSCOPY WITH PROPOFOL N/A 03/06/2019   Procedure: COLONOSCOPY WITH PROPOFOL;  Surgeon: Lucilla Lame, MD;  Location: [facility] ENDOSCOPY;  Service: Endoscopy;  Laterality: N/A;  . DIALYSIS/PERMA CATHETER INSERTION N/A 04/10/2019   Procedure: DIALYSIS/PERMA CATHETER INSERTION;  Surgeon: Serafina Mitchell, MD;  Location: [place] CV LAB;  Service: Cardiovascular;  Laterality: N/A;  . DIALYSIS/PERMA CATHETER REMOVAL N/A 03/07/2020   Procedure: DIALYSIS/PERMA CATHETER REMOVAL;  Surgeon: Algernon Huxley, MD;  Location: [place] CV LAB;  Service: Cardiovascular;  Laterality: N/A;  . ESOPHAGOGASTRODUODENOSCOPY (EGD) WITH PROPOFOL N/A 07/01/2016   Procedure: ESOPHAGOGASTRODUODENOSCOPY (EGD) WITH PROPOFOL;  Surgeon: San Jetty, MD;  Location: [facility] ENDOSCOPY;  Service: General;  Laterality: N/A;  . ESOPHAGOGASTRODUODENOSCOPY (EGD) WITH PROPOFOL N/A 12/25/2019   Procedure: ESOPHAGOGASTRODUODENOSCOPY (EGD) WITH PROPOFOL;  Surgeon: Lin Landsman, MD;  Location: [facility] ENDOSCOPY;  Service: Gastroenterology;  Laterality: N/A;  . LOWER EXTREMITY ANGIOGRAPHY Left 12/10/2019   Procedure: Lower Extremity Angiography;  Surgeon: Algernon Huxley, MD;  Location: [place] CV LAB;  Service: Cardiovascular;  Laterality: Left;     Prior to Admission medications   Medication Sig Start Date End Date Taking? Authorizing Provider  aspirin EC 81 MG EC tablet Take 1 tablet (81 mg total) by mouth daily. 06/10/19   Jennye Boroughs, MD  carvedilol (COREG) 6.25 MG tablet Take 6.25 mg by mouth 2 (two) times daily with a  meal.  10/28/19   [provider]  ENTRESTO 24-26 MG Take 1 tablet by mouth 2 (two) times daily. 07/29/19   [provider]  epoetin alfa (EPOGEN) 10000 UNIT/ML injection Inject 0.4 mLs (4,000 Units total) into the vein Every Tuesday,Thursday,and Saturday with dialysis. 01/05/20   Enzo Bi, MD  folic acid (FOLVITE) 1 MG tablet Take 1 mg by mouth daily. 03/12/19   [provider]  hydrALAZINE (APRESOLINE) 25 MG tablet Take 1 tablet (25 mg total) by mouth in the morning and at bedtime. 01/13/20   Jennye Boroughs, MD  insulin glargine (LANTUS) 100 UNIT/ML injection Inject 0.04 mLs (4 Units total) into the skin daily. 01/04/20   Enzo Bi, MD  insulin lispro (HUMALOG KWIKPEN) 100 UNIT/ML KwikPen Inject 0.03 mLs (3 Units total) into the skin 3 (three) times daily with meals. 01/04/20   Enzo Bi, MD  iron polysaccharides (NIFEREX) 150 MG capsule Take 1 capsule (150 mg total) by mouth daily. 01/05/20 04/04/20  Enzo Bi, MD  Melatonin 3 MG TABS Take 1 tablet by mouth at bedtime. 05/23/19   [provider]  multivitamin (RENA-VIT) TABS tablet Take 1 tablet by mouth daily.    [provider]  nicotine (NICODERM CQ - DOSED IN MG/24 HR) 7 mg/24hr patch Place 1 patch (7 mg total) onto the skin daily. 01/05/20 04/04/20  Enzo Bi, MD  Nutritional Supplements (FEEDING SUPPLEMENT, NEPRO CARB STEADY,) LIQD Take 237 mLs by mouth 3 (three) times daily between meals. Patient not taking: Reported on 03/07/2020 07/08/19  Nicole Kindred A, DO  sertraline (ZOLOFT) 100 MG tablet Take 100 mg by mouth daily.  06/17/19   [provider]  thiamine (VITAMIN B-1) 100 MG tablet Take 100 mg by mouth daily. Patient not taking: Reported on 03/07/2020    [provider]     Allergies Ferrous gluconate and Other   Family History  Problem Relation Age of Onset  . CAD Brother   . Dementia Mother   . Renal Disease Father     Social History Social History   Tobacco Use   . Smoking status: Current Every Day Smoker    Packs/day: 0.25    Years: 5.00    Pack years: 1.25    Types: Cigars  . Smokeless tobacco: Never Used  Vaping Use  . Vaping Use: Never used  Substance Use Topics  . Alcohol use: Yes    Alcohol/week: 16.0 standard drinks    Types: 14 Cans of beer, 2 Shots of liquor per week  . Drug use: No    Review of Systems  Constitutional:   No fever or chills.  ENT:   No sore throat. No rhinorrhea. Cardiovascular:   No chest pain or syncope. Respiratory:   No dyspnea or cough. Gastrointestinal:   Negative for abdominal pain, vomiting and diarrhea.  Musculoskeletal:   Negative for focal pain or swelling All other systems reviewed and are negative except as documented above in ROS and HPI.  ____________________________________________   PHYSICAL EXAM:  VITAL SIGNS: ED Triage Vitals  Enc Vitals Group     BP 03/10/20 1109 133/85     Pulse Rate 03/10/20 1109 87     Resp 03/10/20 1109 20     Temp 03/10/20 1109 98.7 F (37.1 C)     Temp Source 03/10/20 1109 Oral     SpO2 03/10/20 1109 96 %     Weight 03/10/20 1110 140 lb (63.5 kg)     Height 03/10/20 1110 5\' 11"  (1.803 m)     Head Circumference --      Peak Flow --      Pain Score 03/10/20 1110 0     Pain Loc --      Pain Edu? --      Excl. in [place]? --     Vital signs reviewed, nursing assessments reviewed.   Constitutional:   Alert and oriented. Non-toxic appearance. Eyes:   Conjunctivae are normal. EOMI. PERRL. ENT      Head:   Normocephalic and atraumatic.      Nose:   Wearing a mask.      Mouth/Throat:   Wearing a mask.      Neck:   No meningismus. Full ROM. Hematological/Lymphatic/Immunilogical:   No cervical lymphadenopathy. Cardiovascular:   RRR. Symmetric bilateral radial and DP pulses.  No murmurs. Cap refill less than 2 seconds. Right upper extremity AV fistula with bandages in place, palpable thrill, no swelling or inflammatory changes Respiratory:   Normal respiratory  effort without tachypnea/retractions. Breath sounds are clear and equal bilaterally. No wheezes/rales/rhonchi. Gastrointestinal:   Soft and nontender. Non distended. There is no CVA tenderness.  No rebound, rigidity, or guarding. Musculoskeletal:   Normal range of motion in all extremities. No joint effusions.  No lower extremity tenderness.  No edema. Neurologic:   Normal speech and language.  Motor grossly intact. No acute focal neurologic deficits are appreciated.  Skin:    Skin is warm, dry and intact. No rash noted.  No petechiae, purpura, or bullae.  ____________________________________________    LABS (pertinent positives/negatives) (all labs ordered are listed, but only abnormal results are displayed) Labs Reviewed  COMPREHENSIVE METABOLIC PANEL - Abnormal; Notable for the following components:      Result Value   Chloride 97 (*)    Glucose, Bld 245 (*)    Creatinine, Ser 2.43 (*)    Calcium 8.4 (*)    Albumin 3.3 (*)    Alkaline Phosphatase 171 (*)    GFR, Estimated 30 (*)    All other components within normal limits  CBC - Abnormal; Notable for the following components:   RBC 2.81 (*)    Hemoglobin 7.6 (*)    HCT 25.8 (*)    MCHC 29.5 (*)    RDW 15.7 (*)    All other components within normal limits  POC OCCULT BLOOD, ED  TYPE AND SCREEN  PREPARE RBC (CROSSMATCH)   ____________________________________________   EKG    ____________________________________________    RADIOLOGY  No results found.  ____________________________________________   PROCEDURES Procedures  ____________________________________________    CLINICAL IMPRESSION / ASSESSMENT AND PLAN / ED COURSE  Medications ordered in the ED: Medications  0.9 %  sodium chloride infusion (10 mL/hr Intravenous New Bag/Given 03/10/20 1308)    Pertinent labs & imaging results that were available during my care of the patient were reviewed by me and considered in my medical decision making (see  chart for details).  [name] was evaluated in Emergency Department on 03/10/2020 for the symptoms described in the history of present illness. He was evaluated in the context of the global COVID-19 pandemic, which necessitated consideration that the patient might be at risk for infection with the SARS-CoV-2 virus that causes COVID-19. Institutional protocols and algorithms that pertain to the evaluation of patients at risk for COVID-19 are in a state of rapid change based on information released by regulatory bodies including the CDC and federal and state organizations. These policies and algorithms were followed during the patient's care in the ED.     Clinical Course as of Mar 10 1520  Thu Mar 10, 2020  1234 Patient presents with symptomatic anemia due to anemia of chronic disease and ESRD.  Vital signs are normal, just had dialysis today, not orthostatic.  No evidence of GI bleed or other acute blood loss.  Will transfuse 1 unit, and if pulmonary function remains at baseline, can discharge home.   [PS]    Clinical Course User Index [PS] Carrie Mew, MD    ----------------------------------------- 3:21 PM on 03/10/2020 -----------------------------------------  Transfusion complete, no change in symptoms.  Vitals remained stable.   ____________________________________________   FINAL CLINICAL IMPRESSION(S) / ED DIAGNOSES    Final diagnoses:  Symptomatic anemia  ESRD on hemodialysis [facility])     ED Discharge Orders    None      Portions of this note were generated with dragon dictation software. Dictation errors may occur despite best attempts at proofreading.   Carrie Mew, MD 03/10/20 1418    Carrie Mew, MD 03/10/20 [phone]

## 2020-03-10 NOTE — Discharge Instructions (Addendum)
Continue following your dialysis schedule and taking all medications as prescribed.

## 2020-03-10 NOTE — ED Triage Notes (Signed)
Pt comes into the ED via POV c/o low hgb at 6.7.  Pt went to dialysis this morning and when he went to go stand he became dizzy.  They rechecked his blood work and found his Hgb at 6.7.  They wrote an order for patient to receive 1 unit blood. Pt chronically wears O2.  Pt has dizziness when first standing, increased SHOB, and nausea.  Pt in NAd at this time and is tolerating home O2 well.  Pt denies noticing in blood in his stool.

## 2020-03-11 LAB — BPAM RBC
Blood Product Expiration Date: 202111012359
ISSUE DATE / TIME: 202110281252
Unit Type and Rh: 600

## 2020-03-11 LAB — TYPE AND SCREEN
ABO/RH(D): A POS
Antibody Screen: NEGATIVE
Unit division: 0

## 2020-03-28 ENCOUNTER — Emergency Department
Admission: EM | Admit: 2020-03-28 | Discharge: 2020-03-29 | Disposition: A | Discharge status: Home / Self Care | Payer: Medicare Other | Attending: Emergency Medicine | Admitting: Emergency Medicine

## 2020-03-28 ENCOUNTER — Other Ambulatory Visit: Payer: Self-pay

## 2020-03-28 ENCOUNTER — Encounter: Payer: Self-pay | Admitting: *Deleted

## 2020-03-28 DIAGNOSIS — I132 Hypertensive heart and chronic kidney disease with heart failure and with stage 5 chronic kidney disease, or end stage renal disease: Secondary | ICD-10-CM | POA: Diagnosis not present

## 2020-03-28 DIAGNOSIS — Z79899 Other long term (current) drug therapy: Secondary | ICD-10-CM | POA: Insufficient documentation

## 2020-03-28 DIAGNOSIS — Z992 Dependence on renal dialysis: Secondary | ICD-10-CM | POA: Diagnosis not present

## 2020-03-28 DIAGNOSIS — Z7982 Long term (current) use of aspirin: Secondary | ICD-10-CM | POA: Insufficient documentation

## 2020-03-28 DIAGNOSIS — I5023 Acute on chronic systolic (congestive) heart failure: Secondary | ICD-10-CM | POA: Insufficient documentation

## 2020-03-28 DIAGNOSIS — N186 End stage renal disease: Secondary | ICD-10-CM | POA: Diagnosis not present

## 2020-03-28 DIAGNOSIS — E1122 Type 2 diabetes mellitus with diabetic chronic kidney disease: Secondary | ICD-10-CM | POA: Diagnosis not present

## 2020-03-28 DIAGNOSIS — Z794 Long term (current) use of insulin: Secondary | ICD-10-CM | POA: Diagnosis not present

## 2020-03-28 DIAGNOSIS — E1110 Type 2 diabetes mellitus with ketoacidosis without coma: Secondary | ICD-10-CM | POA: Diagnosis not present

## 2020-03-28 DIAGNOSIS — E162 Hypoglycemia, unspecified: Secondary | ICD-10-CM

## 2020-03-28 DIAGNOSIS — F17290 Nicotine dependence, other tobacco product, uncomplicated: Secondary | ICD-10-CM | POA: Diagnosis not present

## 2020-03-28 DIAGNOSIS — E11649 Type 2 diabetes mellitus with hypoglycemia without coma: Secondary | ICD-10-CM | POA: Diagnosis not present

## 2020-03-28 DIAGNOSIS — R4182 Altered mental status, unspecified: Secondary | ICD-10-CM | POA: Diagnosis present

## 2020-03-28 LAB — CBC WITH DIFFERENTIAL/PLATELET
Abs Immature Granulocytes: 0.02 10*3/uL (ref 0.00–0.07)
Basophils Absolute: 0 10*3/uL (ref 0.0–0.1)
Basophils Relative: 1 %
Eosinophils Absolute: 0 10*3/uL (ref 0.0–0.5)
Eosinophils Relative: 0 %
HCT: 30.8 % — ABNORMAL LOW (ref 39.0–52.0)
Hemoglobin: 8.9 g/dL — ABNORMAL LOW (ref 13.0–17.0)
Immature Granulocytes: 0 %
Lymphocytes Relative: 14 %
Lymphs Abs: 1 10*3/uL (ref 0.7–4.0)
MCH: 27 pg (ref 26.0–34.0)
MCHC: 28.9 g/dL — ABNORMAL LOW (ref 30.0–36.0)
MCV: 93.3 fL (ref 80.0–100.0)
Monocytes Absolute: 0.4 10*3/uL (ref 0.1–1.0)
Monocytes Relative: 6 %
Neutro Abs: 5.5 10*3/uL (ref 1.7–7.7)
Neutrophils Relative %: 79 %
Platelets: 293 10*3/uL (ref 150–400)
RBC: 3.3 MIL/uL — ABNORMAL LOW (ref 4.22–5.81)
RDW: 15.8 % — ABNORMAL HIGH (ref 11.5–15.5)
WBC: 7 10*3/uL (ref 4.0–10.5)
nRBC: 0 % (ref 0.0–0.2)

## 2020-03-28 LAB — BASIC METABOLIC PANEL
Anion gap: 15 (ref 5–15)
BUN: 37 mg/dL — ABNORMAL HIGH (ref 6–20)
CO2: 25 mmol/L (ref 22–32)
Calcium: 8.1 mg/dL — ABNORMAL LOW (ref 8.9–10.3)
Chloride: 103 mmol/L (ref 98–111)
Creatinine, Ser: 4.37 mg/dL — ABNORMAL HIGH (ref 0.61–1.24)
GFR, Estimated: 15 mL/min — ABNORMAL LOW (ref >60)
Glucose, Bld: 77 mg/dL (ref 70–99)
Potassium: 4 mmol/L (ref 3.5–5.1)
Sodium: 143 mmol/L (ref 135–145)

## 2020-03-28 LAB — CBG MONITORING, ED
Glucose-Capillary: 160 mg/dL — ABNORMAL HIGH (ref 70–99)
Glucose-Capillary: 38 mg/dL — CL (ref 70–99)
Glucose-Capillary: 163 mg/dL — ABNORMAL HIGH (ref 70–99)
Glucose-Capillary: 220 mg/dL — ABNORMAL HIGH (ref 70–99)
Glucose-Capillary: 82 mg/dL (ref 70–99)
Glucose-Capillary: 246 mg/dL — ABNORMAL HIGH (ref 70–99)
Glucose-Capillary: 261 mg/dL — ABNORMAL HIGH (ref 70–99)
Glucose-Capillary: 234 mg/dL — ABNORMAL HIGH (ref 70–99)

## 2020-03-28 MED ORDER — DEXTROSE 50 % IV SOLN: 25.0000 g | Freq: Once | INTRAVENOUS | Status: AC

## 2020-03-28 MED ORDER — DEXTROSE 10 % IV SOLN: INTRAVENOUS | Status: DC

## 2020-03-28 MED ORDER — DEXTROSE 50 % IV SOLN
INTRAVENOUS | Status: AC
  Administered 2020-03-28: 25 g via INTRAVENOUS
  Filled 2020-03-28: qty 50

## 2020-03-28 NOTE — ED Notes (Signed)
Repeat ekg done  nsr on monitor.

## 2020-03-28 NOTE — ED Notes (Signed)
fsbs 234

## 2020-03-28 NOTE — ED Notes (Signed)
d10iv infusion at 19ml/hr per dr Archie Balboa

## 2020-03-28 NOTE — Discharge Instructions (Addendum)
Please seek medical attention for any high fevers, chest pain, shortness of breath, change in behavior, persistent vomiting, bloody stool or any other new or concerning symptoms.  

## 2020-03-28 NOTE — ED Notes (Signed)
fsbs 82  md aware. Pt eating and drinking

## 2020-03-28 NOTE — ED Notes (Signed)
Pt brought in via ems from home with low blood sugar.  Dialysis pt. Pt on 3 liters oxygen at home.  cig smoker.  Ems gave im glucagon x 2, sugar up to 92 with orange juice and peanut butter on the scene.  fsbs 38 on arrival to treatment room.  Iv started and d50 given stat.  md at bedside. Pt awake and talking.  Denies any pain.  Iv fluids infusing.  ekg done.

## 2020-03-28 NOTE — ED Notes (Signed)
fsbs 160

## 2020-03-28 NOTE — ED Provider Notes (Signed)
[facility] Emergency Department Provider Note   ____________________________________________   I have reviewed the triage vital signs and the nursing notes.   HISTORY  Chief Complaint Hypoglycemia   History limited by and level 5 caveat due to: AMS  HPI Reginald HUHN is a 59 y.o. male who presents to the emergency department today because of concern for altered mental status. Patient unfortunately cannot give any significant history. Upon arrival EMS found the patient to be significantly hypoglycemic. Sugars in the 30s. They did give the patient glycogen x 2. Did slightly help elevate the blood sugar and caused the patient to be more alert to the point he was able to drink orange juice. The patient denies any pain. Denies any recent illness.   Records reviewed. Per medical record review patient has a history of DM, HLD, HTN, ESRD on dialysis.   Past Medical History:  Diagnosis Date  . Chronic kidney disease   . Diabetes mellitus without complication ([place])   . ETOH abuse   . GERD (gastroesophageal reflux disease)   . Hyperlipidemia   . Hypertension   . Stroke [facility])     Patient Active Problem List   Diagnosis Date Noted  . Acute on chronic systolic CHF (congestive heart failure) ([place]) 01/10/2020  . Severe mitral regurgitation 01/10/2020  . Acute on chronic congestive heart failure ([place])   . Acute on chronic respiratory failure with hypoxemia ([place]) 12/29/2019  . Aspiration pneumonia of both lower lobes due to gastric secretions ([place]) 12/29/2019  . Diabetic foot infection ([place]) 12/07/2019  . Acute pulmonary edema ([place]) 12/07/2019  . Paronychia of second toe of left foot 11/09/2019  . Chronic osteomyelitis of toe of left foot ([place]) 11/09/2019  . DM (diabetes mellitus), type 2 with complications ([place]) 56/38/7564  . Hypoglycemia associated with diabetes ([place]) 08/24/2019  . Acute respiratory failure with hypoxia ([place])   . Acute metabolic encephalopathy  33/29/5188  . Hypertensive emergency 07/06/2019  . Acute encephalopathy 07/06/2019  . Seizures due to metabolic disorder ([place]) 41/66/0630  . Hyperosmolar hyperglycemic state (HHS) ([place]) 06/05/2019  . Symptomatic anemia 06/04/2019  . Frequent falls 06/04/2019  . History of CVA (cerebrovascular accident) 06/04/2019  . HCAP (healthcare-associated pneumonia) 06/04/2019  . Acute blood loss anemia 06/04/2019  . Hyperglycemia 05/28/2019  . Fall at home, initial encounter 05/28/2019  . Rib fracture 05/28/2019  . Depression 05/28/2019  . Hypokalemia 05/28/2019  . Weakness 05/27/2019  . ESRD on dialysis ([place])   . Acute renal failure (ARF) ([place]) 04/09/2019  . Polyp of ascending colon   . Diarrhea   . AKI (acute kidney injury) ([place]) 03/04/2019  . Acute kidney injury ([place]) 07/26/2018  . ARF (acute renal failure) ([place]) 07/25/2018  . Bilateral leg numbness 03/19/2018  . Numbness and tingling of both feet 03/19/2018  . CVA (cerebral vascular accident) ([place]) 03/17/2018  . Moderate episode of recurrent major depressive disorder ([place]) 03/11/2018  . UTI (urinary tract infection) 02/27/2018  . Hypoglycemia 01/15/2018  . Type 2 diabetes mellitus without complication, with long-term current use of insulin ([place]) 08/27/2017  . Protein-calorie malnutrition, severe 08/19/2017  . Pancreatitis, acute 08/16/2017  . DKA (diabetic ketoacidoses) 08/16/2017  . HTN (hypertension) 08/16/2017  . HLD (hyperlipidemia) 08/16/2017  . Carotid stenosis 08/02/2016  . GERD (gastroesophageal reflux disease) 08/02/2016  . History of esophagogastroduodenoscopy (EGD) 07/01/2016  . History of recent blood transfusion 07/01/2016  . Acute renal failure superimposed on stage 4 chronic kidney disease ([place]) 07/01/2016  . Hyponatremia 07/01/2016  .  Uncontrolled diabetes mellitus ([place]) 07/01/2016  . Alcohol abuse 07/01/2016  . Monilial esophagitis ([place]) 07/01/2016  . Tobacco abuse 07/01/2016  . Confusion 06/29/2016  . Iron  deficiency anemia 06/29/2016  . Coagulopathy ([place]) 06/29/2016    Past Surgical History:  Procedure Laterality Date  . AMPUTATION TOE Left 12/11/2019   Procedure: AMPUTATION LEFT 2nd TOE;  Surgeon: Sharlotte Alamo, DPM;  Location: [facility] ORS;  Service: Podiatry;  Laterality: Left;  . AV FISTULA PLACEMENT Right 11/25/2019   Procedure: INSERTION OF ARTERIOVENOUS (AV) GORE-TEX GRAFT ARM;  Surgeon: Katha Cabal, MD;  Location: [facility] ORS;  Service: Vascular;  Laterality: Right;  . COLONOSCOPY WITH PROPOFOL N/A 03/06/2019   Procedure: COLONOSCOPY WITH PROPOFOL;  Surgeon: Lucilla Lame, MD;  Location[facility] ENDOSCOPY;  Service: Endoscopy;  Laterality: N/A;  . DIALYSIS/PERMA CATHETER INSERTION N/A 04/10/2019   Procedure: DIALYSIS/PERMA CATHETER INSERTION;  Surgeon: Serafina Mitchell, MD;  Location: [place] CV LAB;  Service: Cardiovascular;  Laterality: N/A;  . DIALYSIS/PERMA CATHETER REMOVAL N/A 03/07/2020   Procedure: DIALYSIS/PERMA CATHETER REMOVAL;  Surgeon: Algernon Huxley, MD;  Location: [place] CV LAB;  Service: Cardiovascular;  Laterality: N/A;  . ESOPHAGOGASTRODUODENOSCOPY (EGD) WITH PROPOFOL N/A 07/01/2016   Procedure: ESOPHAGOGASTRODUODENOSCOPY (EGD) WITH PROPOFOL;  Surgeon: San Jetty, MD;  Location: [facility] ENDOSCOPY;  Service: General;  Laterality: N/A;  . ESOPHAGOGASTRODUODENOSCOPY (EGD) WITH PROPOFOL N/A 12/25/2019   Procedure: ESOPHAGOGASTRODUODENOSCOPY (EGD) WITH PROPOFOL;  Surgeon: Lin Landsman, MD;  Location: [facility] ENDOSCOPY;  Service: Gastroenterology;  Laterality: N/A;  . LOWER EXTREMITY ANGIOGRAPHY Left 12/10/2019   Procedure: Lower Extremity Angiography;  Surgeon: Algernon Huxley, MD;  Location: [facility] CV LAB;  Service: Cardiovascular;  Laterality: Left;    Prior to Admission medications   Medication Sig Start Date End Date Taking? Authorizing Provider  aspirin EC 81 MG EC tablet Take 1 tablet (81 mg total) by mouth daily. 06/10/19   Jennye Boroughs, MD  carvedilol  (COREG) 6.25 MG tablet Take 6.25 mg by mouth 2 (two) times daily with a meal.  10/28/19   [provider]  ENTRESTO 24-26 MG Take 1 tablet by mouth 2 (two) times daily. 07/29/19   [provider]  epoetin alfa (EPOGEN) 10000 UNIT/ML injection Inject 0.4 mLs (4,000 Units total) into the vein Every Tuesday,Thursday,and Saturday with dialysis. 01/05/20   Enzo Bi, MD  folic acid (FOLVITE) 1 MG tablet Take 1 mg by mouth daily. 03/12/19   [provider]  hydrALAZINE (APRESOLINE) 25 MG tablet Take 1 tablet (25 mg total) by mouth in the morning and at bedtime. 01/13/20   Jennye Boroughs, MD  insulin glargine (LANTUS) 100 UNIT/ML injection Inject 0.04 mLs (4 Units total) into the skin daily. 01/04/20   Enzo Bi, MD  insulin lispro (HUMALOG KWIKPEN) 100 UNIT/ML KwikPen Inject 0.03 mLs (3 Units total) into the skin 3 (three) times daily with meals. 01/04/20   Enzo Bi, MD  iron polysaccharides (NIFEREX) 150 MG capsule Take 1 capsule (150 mg total) by mouth daily. 01/05/20 04/04/20  Enzo Bi, MD  Melatonin 3 MG TABS Take 1 tablet by mouth at bedtime. 05/23/19   [provider]  multivitamin (RENA-VIT) TABS tablet Take 1 tablet by mouth daily.    [provider]  nicotine (NICODERM CQ - DOSED IN MG/24 HR) 7 mg/24hr patch Place 1 patch (7 mg total) onto the skin daily. 01/05/20 04/04/20  Enzo Bi, MD  Nutritional Supplements (FEEDING SUPPLEMENT, NEPRO CARB STEADY,) LIQD Take 237 mLs by mouth 3 (three)  times daily between meals. Patient not taking: Reported on 03/07/2020 07/08/19   Nicole Kindred A, DO  sertraline (ZOLOFT) 100 MG tablet Take 100 mg by mouth daily.  06/17/19   [provider]  thiamine (VITAMIN B-1) 100 MG tablet Take 100 mg by mouth daily. Patient not taking: Reported on 03/07/2020    [provider]    Allergies Ferrous gluconate and Other  Family History  Problem Relation Age of Onset  . CAD Brother   . Dementia Mother   . Renal  Disease Father     Social History Social History   Tobacco Use  . Smoking status: Current Every Day Smoker    Packs/day: 0.25    Years: 5.00    Pack years: 1.25    Types: Cigars  . Smokeless tobacco: Never Used  Vaping Use  . Vaping Use: Never used  Substance Use Topics  . Alcohol use: Yes    Alcohol/week: 16.0 standard drinks    Types: 14 Cans of beer, 2 Shots of liquor per week  . Drug use: No    Review of Systems Constitutional: No fever/chills Eyes: No visual changes. ENT: No sore throat. Cardiovascular: Denies chest pain. Respiratory: Denies shortness of breath. Gastrointestinal: No abdominal pain.  No nausea, no vomiting.  No diarrhea.   Genitourinary: Negative for dysuria. Musculoskeletal: Negative for back pain. Skin: Negative for rash. Neurological: Negative for headaches, focal weakness or numbness.  ____________________________________________   PHYSICAL EXAM:  VITAL SIGNS: ED Triage Vitals  Enc Vitals Group     BP 03/28/20 2006 136/81     Pulse Rate 03/28/20 2006 66     Resp 03/28/20 2006 20     Temp 03/28/20 2006 98.1 F (36.7 C)     Temp Source 03/28/20 2006 Oral     SpO2 03/28/20 2006 95 %     Weight 03/28/20 2004 140 lb (63.5 kg)     Height 03/28/20 2004 5\' 11"  (1.803 m)     Head Circumference --      Peak Flow --      Pain Score 03/28/20 2003 0   Constitutional: Alert, not completely oriented.  Eyes: Conjunctivae are normal.  ENT      Head: Normocephalic and atraumatic.      Nose: No congestion/rhinnorhea.      Mouth/Throat: Mucous membranes are moist.      Neck: No stridor. Hematological/Lymphatic/Immunilogical: No cervical lymphadenopathy. Cardiovascular: Normal rate, regular rhythm.  No murmurs, rubs, or gallops.  Respiratory: Normal respiratory effort without tachypnea nor retractions. Breath sounds are clear and equal bilaterally. No wheezes/rales/rhonchi. Gastrointestinal: Soft and non tender. No rebound. No guarding.   Genitourinary: Deferred Musculoskeletal: Normal range of motion in all extremities. No lower extremity edema. Neurologic:  Awake and alert. Not completely oriented.  Skin:  Skin is warm, dry and intact. No rash noted.  ____________________________________________    LABS (pertinent positives/negatives)  BMP na 143, k 4.0, glu 77, cr 4.37 CBC wbc 7.0, hgb 8.9, plt 293  ____________________________________________   EKG  I, Nance Pear, attending physician, personally viewed and interpreted this EKG  EKG Time: 2259 Rate: 73 Rhythm: sinus rhythm Axis: normal Intervals: qtc 568 QRS: low voltage precordial leads ST changes: no st elevation Impression: abnormal ekg   ____________________________________________    RADIOLOGY  None  ____________________________________________   PROCEDURES  Procedures  ____________________________________________   INITIAL IMPRESSION / ASSESSMENT AND PLAN / ED COURSE  Pertinent labs & imaging results that were available during my care  of the patient were reviewed by me and considered in my medical decision making (see chart for details).   Patient presented to the emergency department today because of concerns for altered mental status and a low blood sugar.  Upon initial arrival patient's blood sugar was low.  He was given amp of D50 and started on a D10 infusion.  We were able to wean the patient off of the D10. Will plan on observing in the emergency department until stability of glucose is established. No clear infectious etiology that would possibly be etiology of low blood sugar. ____________________________________________   FINAL CLINICAL IMPRESSION(S) / ED DIAGNOSES  Final diagnoses:  Hypoglycemia     Note: This dictation was prepared with Dragon dictation. Any transcriptional errors that result from this process are unintentional     Nance Pear, MD 03/28/20 2309

## 2020-03-28 NOTE — ED Notes (Signed)
Pt awake and talking.  oxygen in place.  Iv in place.

## 2020-03-28 NOTE — ED Triage Notes (Signed)
Pt brought in via ems from home with low blood sugar.   Dialysis pt.  Ems gave glucagon x 2 im.  No iv access on arrival.  fsbs 38.  meds given , iv started.  md at bedside.

## 2020-03-29 LAB — CBG MONITORING, ED: Glucose-Capillary: 241 mg/dL — ABNORMAL HIGH (ref 70–99)

## 2020-03-29 NOTE — ED Provider Notes (Signed)
Accepted care of this patient from Dr. Archie Balboa at 11:30PM with plan to repeat CBG at 12AM and if stable DC home. Patient presented with AMS due to hypoglycemia. Patient re-examined by me. Patient reports that his wife gave him his evening insulin and he was suppose to eat after that. Wife went to work and patient was not able to make himself any food. Patient denies any recent illness. No fever, cough, cp, sob, abd pain, n/v/d. Has not missed HD. Does not make urine.  Has been off of D10 drip from 3 hours and BG is maintaining stable. Patient ate in the ED. At this time, stable for dc home per plan from Dr. Archie Balboa.  Patient will be discharged to care of wife. Education provided on dangers of taking insulin and not eating.    Rudene Re, MD 03/29/20 [phone]

## 2020-03-29 NOTE — ED Notes (Signed)
fsbs 241  Pt alert

## 2020-03-30 ENCOUNTER — Other Ambulatory Visit: Payer: Self-pay

## 2020-03-30 ENCOUNTER — Other Ambulatory Visit (HOSPITAL_COMMUNITY): Payer: Self-pay

## 2020-03-30 ENCOUNTER — Other Ambulatory Visit: Payer: Medicare Other | Admitting: Adult Health Nurse Practitioner

## 2020-03-30 NOTE — Progress Notes (Signed)
When arrived for appt, Assad is at dialysis making a day up.  Spoke with his wife for about 1/2 hour about Marcin.  He has been having for past 3 nights low blood sugars and EMS has been called and transported once.  Asked if change in diet, she said could be.  Advised her to contact Dr Blanch Media about his low blood sugars at night, she checks his sugars late night and he is on a sliding scale.  She states just started this week.  She states he is still depressed, she is trying to back off doing everything for him because he does when she is not here.  Disicussed his medications, next visit will start placing his meds in a box for her and keeping up with refills.  She is wanting a diabetes monitor that people can use to check his sugars when she is away, advised her to check with his endocrinologist Dr Blanch Media about what his insurance will cover.  He has all his medications and has plenty for everyday living.  Will reschedule appt.  Will visit for heart failure, diet and medication management.   [place] [phone]

## 2020-04-01 ENCOUNTER — Other Ambulatory Visit: Payer: Self-pay

## 2020-04-01 ENCOUNTER — Other Ambulatory Visit: Payer: Medicare Other | Admitting: Adult Health Nurse Practitioner

## 2020-04-01 DIAGNOSIS — Z515 Encounter for palliative care: Secondary | ICD-10-CM

## 2020-04-01 DIAGNOSIS — N186 End stage renal disease: Secondary | ICD-10-CM

## 2020-04-01 DIAGNOSIS — E118 Type 2 diabetes mellitus with unspecified complications: Secondary | ICD-10-CM

## 2020-04-01 NOTE — Progress Notes (Signed)
[facility] Consult Note Telephone: [phone]  Fax: [phone]  PATIENT NAME: [name] DOB: 09/15/1960 MRN: [id number]  PRIMARY CARE PROVIDER:   [name], MD  REFERRING PROVIDER:  [name], [facility] [place],  [place] [address]  RESPONSIBLE PARTY:   Daxen Lanum, wife [phone] Email: [email or web address]  Chief complaint:  Follow up palliative consult/hypoglycemia    RECOMMENDATIONS and PLAN:  1.  Advanced care planning.  full code  2.  Functional status.  Patient is very weak due to anemia.  He does ambulate with a walker.  Does require help with bathing.  He is able to dress himself and get ready for dialysis.  Patient is able to feed himself.  Patient is continent of bowel and bladder.   3.  DMT2. Patient having labile blood sugars.  Log shows that his blood sugars go as high as in the 500s with some reading so high that the glucometer just reads HIGH.  Lowest recorded is 29.  See below for more details. He is currently on SS humalog and lantus 10 units daily.  Will reach out to his endocrinologist, Dr. Gabriel Carina, with his labile blood sugars and to see what adjusts may be needed to his insulin.  Will request that he be seen for a visit.   Palliative will continue to monitor for symptom management/decline and make recommendations as needed.  Next appointment is in 4 weeks.  Encouraged to call with any questions or concerns.  I spent 45 minutes providing this consultation,  from 12:00 to 12:45 including time spent with patient/family, chart review, provider coordination, documentation. More than 50% of the time in this consultation was spent coordinating communication.   HISTORY OF PRESENT ILLNESS:  [name] is a 59 y.o. year old male with multiple medical problems including ESRD on HD T,TH,SAT; DMT 2, CVA with right-sided weakness, HTN, HLD, chronic bilateral ICA  occlusion. Palliative Care was asked to help address goals of care. Patient has had 2 ER visits this week due to hypoglycemia.  Patient has had blood sugars as low as in the 20s and he will be unresponsive and foaming at the mouth.  Patient is having upset stomach and diarrhea today.  Denies fever, sore throat, increased cough or SOB, dysuria, hematuria.  Fasting blood sugar this AM was 346 and about 2 years later his BS was 51.  He is eating cookies to try to get his BS up again.  Wife tries to make sure he gets well balanced meals and only gives him sweets when his BS is low.  Patient does not always eat 3 meals a day.  He states that his appetite is fair.    CODE STATUS: full code  PPS: 40% HOSPICE ELIGIBILITY/DIAGNOSIS: TBD  PHYSICAL EXAM:  BP  110/58  HR  62  O2 98% on 2L General: NAD, frail appearing, thin Cardiovascular: regular rate and rhythm Pulmonary: lung sounds clear; normal respiratory effort Abdomen: soft, nontender, + bowel sounds Extremities: no edema, no joint deformities Skin: no rashes on exposed skin Neurological: Weakness but otherwise nonfocal; does have occasional forgetfulness and confusion   PAST MEDICAL HISTORY:  Past Medical History:  Diagnosis Date  . Chronic kidney disease   . Diabetes mellitus without complication ([place])   . ETOH abuse   . GERD (gastroesophageal reflux disease)   . Hyperlipidemia   . Hypertension   . Stroke [facility])     SOCIAL  HX:  Social History   Tobacco Use  . Smoking status: Current Every Day Smoker    Packs/day: 0.25    Years: 5.00    Pack years: 1.25    Types: Cigars  . Smokeless tobacco: Never Used  Substance Use Topics  . Alcohol use: Yes    Alcohol/week: 16.0 standard drinks    Types: 14 Cans of beer, 2 Shots of liquor per week    ALLERGIES:  Allergies  Allergen Reactions  . Ferrous Gluconate Nausea And Vomiting  . Other      PERTINENT MEDICATIONS:  Outpatient Encounter Medications as of 04/01/2020  Medication  Sig  . aspirin EC 81 MG EC tablet Take 1 tablet (81 mg total) by mouth daily.  . carvedilol (COREG) 6.25 MG tablet Take 6.25 mg by mouth 2 (two) times daily with a meal.   . ENTRESTO 24-26 MG Take 1 tablet by mouth 2 (two) times daily.  [name] epoetin alfa (EPOGEN) 10000 UNIT/ML injection Inject 0.4 mLs (4,000 Units total) into the vein Every Tuesday,Thursday,and Saturday with dialysis.  . folic acid (FOLVITE) 1 MG tablet Take 1 mg by mouth daily.  . hydrALAZINE (APRESOLINE) 25 MG tablet Take 1 tablet (25 mg total) by mouth in the morning and at bedtime.  . insulin glargine (LANTUS) 100 UNIT/ML injection Inject 0.04 mLs (4 Units total) into the skin daily. (Patient taking differently: Inject 10 Units into the skin daily. )  . insulin lispro (HUMALOG KWIKPEN) 100 UNIT/ML KwikPen Inject 0.03 mLs (3 Units total) into the skin 3 (three) times daily with meals.  . iron polysaccharides (NIFEREX) 150 MG capsule Take 1 capsule (150 mg total) by mouth daily.  . Melatonin 3 MG TABS Take 1 tablet by mouth at bedtime.  . multivitamin (RENA-VIT) TABS tablet Take 1 tablet by mouth daily.  . nicotine (NICODERM CQ - DOSED IN MG/24 HR) 7 mg/24hr patch Place 1 patch (7 mg total) onto the skin daily.  . Nutritional Supplements (FEEDING SUPPLEMENT, NEPRO CARB STEADY,) LIQD Take 237 mLs by mouth 3 (three) times daily between meals. (Patient not taking: Reported on 03/07/2020)  . sertraline (ZOLOFT) 100 MG tablet Take 100 mg by mouth daily.   [name] thiamine (VITAMIN B-1) 100 MG tablet Take 100 mg by mouth daily. (Patient not taking: Reported on 03/07/2020)   No facility-administered encounter medications on file as of 04/01/2020.     Padraic Marinos Jenetta Downer, NP

## 2020-04-08 ENCOUNTER — Inpatient Hospital Stay
Admission: EM | Admit: 2020-04-08 | Discharge: 2020-04-12 | DRG: 637 | Disposition: A | Discharge status: Skilled Nursing Facility | Payer: Medicare Other | Attending: Family Medicine | Admitting: Family Medicine

## 2020-04-08 ENCOUNTER — Inpatient Hospital Stay: Payer: Medicare Other

## 2020-04-08 ENCOUNTER — Emergency Department: Payer: Medicare Other

## 2020-04-08 ENCOUNTER — Other Ambulatory Visit: Payer: Self-pay

## 2020-04-08 ENCOUNTER — Encounter: Payer: Self-pay | Admitting: Internal Medicine

## 2020-04-08 DIAGNOSIS — I69351 Hemiplegia and hemiparesis following cerebral infarction affecting right dominant side: Secondary | ICD-10-CM

## 2020-04-08 DIAGNOSIS — D631 Anemia in chronic kidney disease: Secondary | ICD-10-CM | POA: Diagnosis not present

## 2020-04-08 DIAGNOSIS — K863 Pseudocyst of pancreas: Secondary | ICD-10-CM | POA: Diagnosis present

## 2020-04-08 DIAGNOSIS — E872 Acidosis, unspecified: Secondary | ICD-10-CM | POA: Diagnosis present

## 2020-04-08 DIAGNOSIS — E118 Type 2 diabetes mellitus with unspecified complications: Secondary | ICD-10-CM | POA: Diagnosis present

## 2020-04-08 DIAGNOSIS — I5022 Chronic systolic (congestive) heart failure: Secondary | ICD-10-CM | POA: Diagnosis present

## 2020-04-08 DIAGNOSIS — I132 Hypertensive heart and chronic kidney disease with heart failure and with stage 5 chronic kidney disease, or end stage renal disease: Secondary | ICD-10-CM | POA: Diagnosis present

## 2020-04-08 DIAGNOSIS — E1122 Type 2 diabetes mellitus with diabetic chronic kidney disease: Secondary | ICD-10-CM | POA: Diagnosis present

## 2020-04-08 DIAGNOSIS — G928 Other toxic encephalopathy: Secondary | ICD-10-CM | POA: Diagnosis present

## 2020-04-08 DIAGNOSIS — N2581 Secondary hyperparathyroidism of renal origin: Secondary | ICD-10-CM | POA: Diagnosis present

## 2020-04-08 DIAGNOSIS — E11649 Type 2 diabetes mellitus with hypoglycemia without coma: Secondary | ICD-10-CM | POA: Diagnosis present

## 2020-04-08 DIAGNOSIS — K5900 Constipation, unspecified: Secondary | ICD-10-CM | POA: Diagnosis present

## 2020-04-08 DIAGNOSIS — N186 End stage renal disease: Secondary | ICD-10-CM

## 2020-04-08 DIAGNOSIS — Z9115 Patient's noncompliance with renal dialysis: Secondary | ICD-10-CM

## 2020-04-08 DIAGNOSIS — E162 Hypoglycemia, unspecified: Secondary | ICD-10-CM

## 2020-04-08 DIAGNOSIS — G9341 Metabolic encephalopathy: Secondary | ICD-10-CM | POA: Diagnosis not present

## 2020-04-08 DIAGNOSIS — J9611 Chronic respiratory failure with hypoxia: Secondary | ICD-10-CM | POA: Diagnosis present

## 2020-04-08 DIAGNOSIS — Z79899 Other long term (current) drug therapy: Secondary | ICD-10-CM

## 2020-04-08 DIAGNOSIS — Z89422 Acquired absence of other left toe(s): Secondary | ICD-10-CM

## 2020-04-08 DIAGNOSIS — Z992 Dependence on renal dialysis: Secondary | ICD-10-CM | POA: Diagnosis not present

## 2020-04-08 DIAGNOSIS — K219 Gastro-esophageal reflux disease without esophagitis: Secondary | ICD-10-CM | POA: Diagnosis present

## 2020-04-08 DIAGNOSIS — E785 Hyperlipidemia, unspecified: Secondary | ICD-10-CM | POA: Diagnosis present

## 2020-04-08 DIAGNOSIS — F32A Depression, unspecified: Secondary | ICD-10-CM | POA: Diagnosis present

## 2020-04-08 DIAGNOSIS — M86672 Other chronic osteomyelitis, left ankle and foot: Secondary | ICD-10-CM | POA: Diagnosis present

## 2020-04-08 DIAGNOSIS — E160 Drug-induced hypoglycemia without coma: Secondary | ICD-10-CM | POA: Diagnosis present

## 2020-04-08 DIAGNOSIS — R296 Repeated falls: Secondary | ICD-10-CM | POA: Diagnosis present

## 2020-04-08 DIAGNOSIS — Z9981 Dependence on supplemental oxygen: Secondary | ICD-10-CM

## 2020-04-08 DIAGNOSIS — F17290 Nicotine dependence, other tobacco product, uncomplicated: Secondary | ICD-10-CM | POA: Diagnosis present

## 2020-04-08 DIAGNOSIS — R680 Hypothermia, not associated with low environmental temperature: Secondary | ICD-10-CM | POA: Diagnosis present

## 2020-04-08 DIAGNOSIS — I639 Cerebral infarction, unspecified: Secondary | ICD-10-CM | POA: Diagnosis present

## 2020-04-08 DIAGNOSIS — A419 Sepsis, unspecified organism: Secondary | ICD-10-CM

## 2020-04-08 DIAGNOSIS — T383X5A Adverse effect of insulin and oral hypoglycemic [antidiabetic] drugs, initial encounter: Secondary | ICD-10-CM | POA: Diagnosis present

## 2020-04-08 DIAGNOSIS — F1010 Alcohol abuse, uncomplicated: Secondary | ICD-10-CM | POA: Diagnosis present

## 2020-04-08 DIAGNOSIS — I340 Nonrheumatic mitral (valve) insufficiency: Secondary | ICD-10-CM | POA: Diagnosis present

## 2020-04-08 DIAGNOSIS — Z20822 Contact with and (suspected) exposure to covid-19: Secondary | ICD-10-CM | POA: Diagnosis present

## 2020-04-08 DIAGNOSIS — D696 Thrombocytopenia, unspecified: Secondary | ICD-10-CM | POA: Diagnosis present

## 2020-04-08 DIAGNOSIS — Z7982 Long term (current) use of aspirin: Secondary | ICD-10-CM

## 2020-04-08 DIAGNOSIS — Z888 Allergy status to other drugs, medicaments and biological substances status: Secondary | ICD-10-CM

## 2020-04-08 DIAGNOSIS — I272 Pulmonary hypertension, unspecified: Secondary | ICD-10-CM | POA: Diagnosis present

## 2020-04-08 DIAGNOSIS — Z794 Long term (current) use of insulin: Secondary | ICD-10-CM

## 2020-04-08 DIAGNOSIS — T68XXXA Hypothermia, initial encounter: Secondary | ICD-10-CM

## 2020-04-08 DIAGNOSIS — Z841 Family history of disorders of kidney and ureter: Secondary | ICD-10-CM

## 2020-04-08 LAB — CBC WITH DIFFERENTIAL/PLATELET
Abs Immature Granulocytes: 0.02 10*3/uL (ref 0.00–0.07)
Basophils Absolute: 0 10*3/uL (ref 0.0–0.1)
Basophils Relative: 0 %
Eosinophils Absolute: 0 10*3/uL (ref 0.0–0.5)
Eosinophils Relative: 0 %
HCT: 24.4 % — ABNORMAL LOW (ref 39.0–52.0)
Hemoglobin: 7 g/dL — ABNORMAL LOW (ref 13.0–17.0)
Immature Granulocytes: 0 %
Lymphocytes Relative: 16 %
Lymphs Abs: 1.3 10*3/uL (ref 0.7–4.0)
MCH: 26.7 pg (ref 26.0–34.0)
MCHC: 28.7 g/dL — ABNORMAL LOW (ref 30.0–36.0)
MCV: 93.1 fL (ref 80.0–100.0)
Monocytes Absolute: 0.5 10*3/uL (ref 0.1–1.0)
Monocytes Relative: 6 %
Neutro Abs: 6.5 10*3/uL (ref 1.7–7.7)
Neutrophils Relative %: 78 %
Platelets: 188 10*3/uL (ref 150–400)
RBC: 2.62 MIL/uL — ABNORMAL LOW (ref 4.22–5.81)
RDW: 16.7 % — ABNORMAL HIGH (ref 11.5–15.5)
WBC: 8.3 10*3/uL (ref 4.0–10.5)
nRBC: 0 % (ref 0.0–0.2)

## 2020-04-08 LAB — COMPREHENSIVE METABOLIC PANEL
ALT: 39 U/L (ref 0–44)
AST: 44 U/L — ABNORMAL HIGH (ref 15–41)
Albumin: 2.8 g/dL — ABNORMAL LOW (ref 3.5–5.0)
Alkaline Phosphatase: 221 U/L — ABNORMAL HIGH (ref 38–126)
Anion gap: 14 (ref 5–15)
BUN: 35 mg/dL — ABNORMAL HIGH (ref 6–20)
CO2: 25 mmol/L (ref 22–32)
Calcium: 8.3 mg/dL — ABNORMAL LOW (ref 8.9–10.3)
Chloride: 99 mmol/L (ref 98–111)
Creatinine, Ser: 4.01 mg/dL — ABNORMAL HIGH (ref 0.61–1.24)
GFR, Estimated: 16 mL/min — ABNORMAL LOW (ref >60)
Glucose, Bld: 328 mg/dL — ABNORMAL HIGH (ref 70–99)
Potassium: 4.8 mmol/L (ref 3.5–5.1)
Sodium: 138 mmol/L (ref 135–145)
Total Bilirubin: 0.7 mg/dL (ref 0.3–1.2)
Total Protein: 6.6 g/dL (ref 6.5–8.1)

## 2020-04-08 LAB — CBG MONITORING, ED
Glucose-Capillary: 162 mg/dL — ABNORMAL HIGH (ref 70–99)
Glucose-Capillary: <10 mg/dL — CL (ref 70–99)
Glucose-Capillary: <10 mg/dL — CL (ref 70–99)
Glucose-Capillary: 301 mg/dL — ABNORMAL HIGH (ref 70–99)
Glucose-Capillary: 199 mg/dL — ABNORMAL HIGH (ref 70–99)
Glucose-Capillary: 177 mg/dL — ABNORMAL HIGH (ref 70–99)
Glucose-Capillary: 153 mg/dL — ABNORMAL HIGH (ref 70–99)

## 2020-04-08 LAB — RESP PANEL BY RT-PCR (FLU A&B, COVID) ARPGX2
Influenza A by PCR: NEGATIVE
Influenza B by PCR: NEGATIVE
SARS Coronavirus 2 by RT PCR: NEGATIVE

## 2020-04-08 LAB — IRON AND TIBC
Iron: 26 ug/dL — ABNORMAL LOW (ref 45–182)
Saturation Ratios: 9 % — ABNORMAL LOW (ref 17.9–39.5)
TIBC: 288 ug/dL (ref 250–450)
UIBC: 262 ug/dL

## 2020-04-08 LAB — GLUCOSE, CAPILLARY
Glucose-Capillary: 130 mg/dL — ABNORMAL HIGH (ref 70–99)
Glucose-Capillary: 231 mg/dL — ABNORMAL HIGH (ref 70–99)
Glucose-Capillary: 292 mg/dL — ABNORMAL HIGH (ref 70–99)

## 2020-04-08 LAB — LACTIC ACID, PLASMA
Lactic Acid, Venous: 6.6 mmol/L (ref 0.5–1.9)
Lactic Acid, Venous: 1.1 mmol/L (ref 0.5–1.9)
Lactic Acid, Venous: 1.1 mmol/L (ref 0.5–1.9)
Lactic Acid, Venous: 1.2 mmol/L (ref 0.5–1.9)

## 2020-04-08 LAB — MAGNESIUM: Magnesium: 2.7 mg/dL — ABNORMAL HIGH (ref 1.7–2.4)

## 2020-04-08 MED ORDER — NEPRO/CARBSTEADY PO LIQD
237.0000 mL | Freq: Three times a day (TID) | ORAL | Status: DC
  Administered 2020-04-08 – 2020-04-11 (×8): 237 mL via ORAL

## 2020-04-08 MED ORDER — HYDRALAZINE HCL 25 MG PO TABS
25.0000 mg | ORAL_TABLET | Freq: Two times a day (BID) | ORAL | Status: DC
  Administered 2020-04-08 – 2020-04-11 (×7): 25 mg via ORAL
  Filled 2020-04-08 (×8): qty 1

## 2020-04-08 MED ORDER — SODIUM CHLORIDE 0.9 % IV SOLN
2.0000 g | Freq: Once | INTRAVENOUS | Status: AC
  Administered 2020-04-08: 2 g via INTRAVENOUS
  Filled 2020-04-08 (×2): qty 2

## 2020-04-08 MED ORDER — RENA-VITE PO TABS
1.0000 | ORAL_TABLET | Freq: Every day | ORAL | Status: DC
  Administered 2020-04-08 – 2020-04-12 (×5): 1 via ORAL
  Filled 2020-04-08 (×5): qty 1

## 2020-04-08 MED ORDER — IOHEXOL 350 MG/ML SOLN
100.0000 mL | Freq: Once | INTRAVENOUS | Status: AC | PRN
  Administered 2020-04-08: 100 mL via INTRAVENOUS

## 2020-04-08 MED ORDER — MELATONIN 5 MG PO TABS
5.0000 mg | ORAL_TABLET | Freq: Every day | ORAL | Status: DC
  Administered 2020-04-08 – 2020-04-11 (×4): 5 mg via ORAL
  Filled 2020-04-08 (×4): qty 1

## 2020-04-08 MED ORDER — DEXTROSE 50 % IV SOLN: 25.0000 g | Freq: Once | INTRAVENOUS | Status: AC

## 2020-04-08 MED ORDER — ONDANSETRON HCL 4 MG/2ML IJ SOLN: 4.0000 mg | Freq: Four times a day (QID) | INTRAMUSCULAR | Status: DC | PRN

## 2020-04-08 MED ORDER — DEXTROSE-NACL 10-0.45 % IV SOLN
INTRAVENOUS | Status: DC
  Filled 2020-04-08 (×2): qty 1000

## 2020-04-08 MED ORDER — ONDANSETRON HCL 4 MG PO TABS
4.0000 mg | ORAL_TABLET | Freq: Four times a day (QID) | ORAL | Status: DC | PRN
  Administered 2020-04-08: 4 mg via ORAL
  Filled 2020-04-08: qty 1

## 2020-04-08 MED ORDER — POLYSACCHARIDE IRON COMPLEX 150 MG PO CAPS
150.0000 mg | ORAL_CAPSULE | Freq: Every day | ORAL | Status: DC
  Administered 2020-04-08 – 2020-04-12 (×5): 150 mg via ORAL
  Filled 2020-04-08 (×5): qty 1

## 2020-04-08 MED ORDER — MIRTAZAPINE 15 MG PO TABS
7.5000 mg | ORAL_TABLET | Freq: Every day | ORAL | Status: DC
  Administered 2020-04-08 – 2020-04-11 (×4): 7.5 mg via ORAL
  Filled 2020-04-08 (×4): qty 1

## 2020-04-08 MED ORDER — ACETAMINOPHEN 650 MG RE SUPP: 650.0000 mg | Freq: Four times a day (QID) | RECTAL | Status: DC | PRN

## 2020-04-08 MED ORDER — FOLIC ACID 1 MG PO TABS
1.0000 mg | ORAL_TABLET | Freq: Every day | ORAL | Status: DC
  Administered 2020-04-08 – 2020-04-12 (×5): 1 mg via ORAL
  Filled 2020-04-08 (×5): qty 1

## 2020-04-08 MED ORDER — DEXTROSE 50 % IV SOLN
INTRAVENOUS | Status: AC
  Administered 2020-04-08: 25 g via INTRAVENOUS
  Filled 2020-04-08: qty 50

## 2020-04-08 MED ORDER — SACUBITRIL-VALSARTAN 24-26 MG PO TABS
1.0000 | ORAL_TABLET | Freq: Two times a day (BID) | ORAL | Status: DC
  Administered 2020-04-08 – 2020-04-11 (×6): 1 via ORAL
  Filled 2020-04-08 (×8): qty 1

## 2020-04-08 MED ORDER — SERTRALINE HCL 50 MG PO TABS
100.0000 mg | ORAL_TABLET | Freq: Every day | ORAL | Status: DC
  Administered 2020-04-08: 100 mg via ORAL
  Filled 2020-04-08: qty 2

## 2020-04-08 MED ORDER — CARVEDILOL 6.25 MG PO TABS
6.2500 mg | ORAL_TABLET | Freq: Two times a day (BID) | ORAL | Status: DC
  Administered 2020-04-08 – 2020-04-12 (×8): 6.25 mg via ORAL
  Filled 2020-04-08 (×8): qty 1

## 2020-04-08 MED ORDER — ASPIRIN EC 81 MG PO TBEC
81.0000 mg | DELAYED_RELEASE_TABLET | Freq: Every day | ORAL | Status: DC
  Administered 2020-04-08 – 2020-04-12 (×5): 81 mg via ORAL
  Filled 2020-04-08 (×5): qty 1

## 2020-04-08 MED ORDER — EPOETIN ALFA 10000 UNIT/ML IJ SOLN: 4000.0000 [IU] | INTRAMUSCULAR | Status: DC

## 2020-04-08 MED ORDER — SODIUM CHLORIDE 0.9 % IV BOLUS
500.0000 mL | Freq: Once | INTRAVENOUS | Status: AC
  Administered 2020-04-08: 500 mL via INTRAVENOUS

## 2020-04-08 MED ORDER — LIDOCAINE-PRILOCAINE 2.5-2.5 % EX CREA
1.0000 "application " | TOPICAL_CREAM | CUTANEOUS | Status: DC
  Filled 2020-04-08: qty 5

## 2020-04-08 MED ORDER — ACETAMINOPHEN 325 MG PO TABS
650.0000 mg | ORAL_TABLET | Freq: Four times a day (QID) | ORAL | Status: DC | PRN
  Administered 2020-04-08: 650 mg via ORAL
  Filled 2020-04-08: qty 2

## 2020-04-08 MED ORDER — VANCOMYCIN HCL IN DEXTROSE 1-5 GM/200ML-% IV SOLN
1000.0000 mg | Freq: Once | INTRAVENOUS | Status: AC
  Administered 2020-04-08: 1000 mg via INTRAVENOUS
  Filled 2020-04-08: qty 200

## 2020-04-08 MED ORDER — DEXTROSE 5 % IV SOLN: INTRAVENOUS | Status: DC

## 2020-04-08 NOTE — ED Notes (Signed)
Advised nurse that patient has assigned bed 

## 2020-04-08 NOTE — H&P (Signed)
History and Physical    [name] XTK:[id number] DOB: May 07, 1961 DOA: 04/08/2020  PCP: [name], MD   Patient coming from: Home  I have personally briefly reviewed patient's old medical records in [place]  Chief Complaint: Change in mental status   Most of the history was obtained from patient's wife over the phone as patient is very lethargic unable to provide history chart  HPI: [name] is a 59 y.o. male with medical history significant for end-stage renal disease on hemodialysis (dialysis days are T/TH/S), insulin-dependent diabetes mellitus, hypertension, CVA, GERD who was brought into the ER by EMS for evaluation of hypoglycemia.  Patient's wife states that he was in his usual state of health and had gone to bed last night.  She states that he had a good meal and prior to going to bed he had requested a snack.  She checked his blood sugar and it was 441 so she administered his Lantus 10 units as well as Humalog based on his sliding scale coverage.  On the morning of his admission she had gone into check on him and was unable to arouse him.  She checked his blood sugar and it was 41.  Patient was lethargic and unable to take any p.o. and so she called EMS.  When EMS arrived his blood sugar was 28.  Patient received 2 Amps of D50 with improvement in his blood sugar levels as well as his mental status. He remains lethargic during my interview but knows that he is in the hospital and also knows the day of the week. I am unable to do a review of systems on this patient. Labs show sodium 138, potassium 4.8, chloride 99, bicarb 25, glucose 328, BUN 35, creatinine 4, calcium 8.3, magnesium 2.7, alkaline phosphatase 221, albumin 2.8, AST 44, ALT 39, total protein 6.6, lactic acid 6.6, white count 8.3, hemoglobin 7.0 down from 8.9 on 03/28/20, hematocrit 24, MCV 93, RDW 16.7, platelet count 188 Respiratory viral panel is negative Chest x-ray reviewed by me shows  cardiomegaly with findings suggestive of pulmonary edema.    ED Course: Patient is a 59 year old African-American male who was brought into the ER by EMS for evaluation of mental status changes related to hypoglycemia.  Patient had a blood sugar of 28 in the field and received 2 Amps of D50 in the ER with improvement in his blood sugars.  He was hypothermic with a core temperature of 90.35F and is currently on a Retail banker.  Lactic acid is elevated at 6.6.  Patient received empiric antibiotic therapy with vancomycin and cefepime in the ER for suspected sepsis.  He will be admitted to the hospital for further evaluation.  Review of Systems: As per HPI otherwise 10 point review of systems negative.   Past Medical History:  Diagnosis Date  . Chronic kidney disease   . Diabetes mellitus without complication ([place])   . ETOH abuse   . GERD (gastroesophageal reflux disease)   . Hyperlipidemia   . Hypertension   . Stroke [facility])     Past Surgical History:  Procedure Laterality Date  . AMPUTATION TOE Left 12/11/2019   Procedure: AMPUTATION LEFT 2nd TOE;  Surgeon: Sharlotte Alamo, DPM;  Location: [facility] ORS;  Service: Podiatry;  Laterality: Left;  . AV FISTULA PLACEMENT Right 11/25/2019   Procedure: INSERTION OF ARTERIOVENOUS (AV) GORE-TEX GRAFT ARM;  Surgeon: Katha Cabal, MD;  Location: [facility] ORS;  Service: Vascular;  Laterality: Right;  . COLONOSCOPY  WITH PROPOFOL N/A 03/06/2019   Procedure: COLONOSCOPY WITH PROPOFOL;  Surgeon: Lucilla Lame, MD;  Location: [facility] ENDOSCOPY;  Service: Endoscopy;  Laterality: N/A;  . DIALYSIS/PERMA CATHETER INSERTION N/A 04/10/2019   Procedure: DIALYSIS/PERMA CATHETER INSERTION;  Surgeon: Serafina Mitchell, MD;  Location: [place] CV LAB;  Service: Cardiovascular;  Laterality: N/A;  . DIALYSIS/PERMA CATHETER REMOVAL N/A 03/07/2020   Procedure: DIALYSIS/PERMA CATHETER REMOVAL;  Surgeon: Algernon Huxley, MD;  Location: [place] CV LAB;  Service: Cardiovascular;   Laterality: N/A;  . ESOPHAGOGASTRODUODENOSCOPY (EGD) WITH PROPOFOL N/A 07/01/2016   Procedure: ESOPHAGOGASTRODUODENOSCOPY (EGD) WITH PROPOFOL;  Surgeon: San Jetty, MD;  Location: [facility] ENDOSCOPY;  Service: General;  Laterality: N/A;  . ESOPHAGOGASTRODUODENOSCOPY (EGD) WITH PROPOFOL N/A 12/25/2019   Procedure: ESOPHAGOGASTRODUODENOSCOPY (EGD) WITH PROPOFOL;  Surgeon: Lin Landsman, MD;  Location: [facility] ENDOSCOPY;  Service: Gastroenterology;  Laterality: N/A;  . LOWER EXTREMITY ANGIOGRAPHY Left 12/10/2019   Procedure: Lower Extremity Angiography;  Surgeon: Algernon Huxley, MD;  Location: [place] CV LAB;  Service: Cardiovascular;  Laterality: Left;     reports that he has been smoking cigars. He has a 1.25 pack-year smoking history. He has never used smokeless tobacco. He reports current alcohol use of about 16.0 standard drinks of alcohol per week. He reports that he does not use drugs.  Allergies  Allergen Reactions  . Ferrous Gluconate Nausea And Vomiting  . Other     Family History  Problem Relation Age of Onset  . CAD Brother   . Dementia Mother   . Renal Disease Father      Prior to Admission medications   Medication Sig Start Date End Date Taking? Authorizing Provider  aspirin EC 81 MG EC tablet Take 1 tablet (81 mg total) by mouth daily. 06/10/19   Jennye Boroughs, MD  carvedilol (COREG) 6.25 MG tablet Take 6.25 mg by mouth 2 (two) times daily with a meal.  10/28/19   [provider]  ENTRESTO 24-26 MG Take 1 tablet by mouth 2 (two) times daily. 07/29/19   [provider]  epoetin alfa (EPOGEN) 10000 UNIT/ML injection Inject 0.4 mLs (4,000 Units total) into the vein Every Tuesday,Thursday,and Saturday with dialysis. 01/05/20   Enzo Bi, MD  folic acid (FOLVITE) 1 MG tablet Take 1 mg by mouth daily. 03/12/19   [provider]  hydrALAZINE (APRESOLINE) 25 MG tablet Take 1 tablet (25 mg total) by mouth in the morning and at bedtime. 01/13/20   Jennye Boroughs, MD  insulin lispro (HUMALOG KWIKPEN) 100 UNIT/ML KwikPen Inject 0.03 mLs (3 Units total) into the skin 3 (three) times daily with meals. 01/04/20   Enzo Bi, MD  iron polysaccharides (NIFEREX) 150 MG capsule Take 1 capsule (150 mg total) by mouth daily. 01/05/20 04/04/20  Enzo Bi, MD  LANTUS SOLOSTAR 100 UNIT/ML Solostar Pen Inject 20 Units into the skin daily. 02/25/20   [provider]  lidocaine-prilocaine (EMLA) cream Apply 1 application topically as directed. 02/20/20   [provider]  Melatonin 3 MG TABS Take 1 tablet by mouth at bedtime. 05/23/19   [provider]  mirtazapine (REMERON) 7.5 MG tablet Take 7.5 mg by mouth at bedtime. 02/25/20   [provider]  multivitamin (RENA-VIT) TABS tablet Take 1 tablet by mouth daily.    [provider]  Nutritional Supplements (FEEDING SUPPLEMENT, NEPRO CARB STEADY,) LIQD Take 237 mLs by mouth 3 (three) times daily between meals. Patient not taking: Reported on 03/07/2020 07/08/19   Ezekiel Slocumb,  DO  sertraline (ZOLOFT) 100 MG tablet Take 100 mg by mouth daily.  06/17/19   [provider]  thiamine (VITAMIN B-1) 100 MG tablet Take 100 mg by mouth daily. Patient not taking: Reported on 03/07/2020    [provider]    Physical Exam: Vitals:   04/08/20 0915 04/08/20 0930 04/08/20 0945 04/08/20 1000  BP: 114/60 115/67 (!) 117/59 (!) 117/59  Pulse: 70 70 63 69  Resp: 12 12 13 10   Temp: (!) 92.2 F (33.4 C) (!) 92.5 F (33.6 C) (!) 92.8 F (33.8 C) (!) 93.3 F (34.1 C)  TempSrc:      SpO2: 93% 94% 96% 94%  Weight:      Height:         Vitals:   04/08/20 0915 04/08/20 0930 04/08/20 0945 04/08/20 1000  BP: 114/60 115/67 (!) 117/59 (!) 117/59  Pulse: 70 70 63 69  Resp: 12 12 13 10   Temp: (!) 92.2 F (33.4 C) (!) 92.5 F (33.6 C) (!) 92.8 F (33.8 C) (!) 93.3 F (34.1 C)  TempSrc:      SpO2: 93% 94% 96% 94%  Weight:      Height:        Constitutional: NAD,  lethargic but arouses to loud verbal stimulation Eyes: PERRL, lids and conjunctivae pallor ENMT: Mucous membranes are moist.  Neck: normal, supple, no masses, no thyromegaly Respiratory: Bilateral air entry, no wheezing,  Crackles at the bases. Normal respiratory effort. No accessory muscle use.  Cardiovascular: Regular rate and rhythm, no murmurs / rubs / gallops. No extremity edema. 2+ pedal pulses. No carotid bruits.  Abdomen: Diffusely tender ( patient winces on palpation), no masses palpated. No hepatosplenomegaly. Bowel sounds positive.  Musculoskeletal: no clubbing / cyanosis. No joint deformity upper and lower extremities.  Skin: no rashes, lesions, ulcers.  Neurologic: No gross focal neurologic deficit. Psychiatric: Normal mood and affect.   Labs on Admission: I have personally reviewed following labs and imaging studies  CBC: Recent Labs  Lab 04/08/20 0758  WBC 8.3  NEUTROABS 6.5  HGB 7.0*  HCT 24.4*  MCV 93.1  PLT 468   Basic Metabolic Panel: Recent Labs  Lab 04/08/20 0758  NA 138  K 4.8  CL 99  CO2 25  GLUCOSE 328*  BUN 35*  CREATININE 4.01*  CALCIUM 8.3*  MG 2.7*   GFR: Estimated Creatinine Clearance: 16.9 mL/min (A) (by C-G formula based on SCr of 4.01 mg/dL (H)). Liver Function Tests: Recent Labs  Lab 04/08/20 0758  AST 44*  ALT 39  ALKPHOS 221*  BILITOT 0.7  PROT 6.6  ALBUMIN 2.8*   No results for input(s): LIPASE, AMYLASE in the last 168 hours. No results for input(s): AMMONIA in the last 168 hours. Coagulation Profile: No results for input(s): INR, PROTIME in the last 168 hours. Cardiac Enzymes: No results for input(s): CKTOTAL, CKMB, CKMBINDEX, TROPONINI in the last 168 hours. BNP (last 3 results) No results for input(s): PROBNP in the last 8760 hours. HbA1C: No results for input(s): HGBA1C in the last 72 hours. CBG: Recent Labs  Lab 04/08/20 0740 04/08/20 0800 04/08/20 0823 04/08/20 0902  GLUCAP <10* <10* 162* 301*   Lipid  Profile: No results for input(s): CHOL, HDL, LDLCALC, TRIG, CHOLHDL, LDLDIRECT in the last 72 hours. Thyroid Function Tests: No results for input(s): TSH, T4TOTAL, FREET4, T3FREE, THYROIDAB in the last 72 hours. Anemia Panel: No results for input(s): VITAMINB12, FOLATE, FERRITIN, TIBC, IRON, RETICCTPCT in the last 72 hours.  Urine analysis:    Component Value Date/Time   COLORURINE YELLOW (A) 07/06/2019 0144   APPEARANCEUR CLOUDY (A) 07/06/2019 0144   APPEARANCEUR Clear 01/17/2014 1551   LABSPEC 1.017 07/06/2019 0144   LABSPEC 1.011 01/17/2014 1551   PHURINE 7.0 07/06/2019 0144   GLUCOSEU >=500 (A) 07/06/2019 0144   GLUCOSEU Negative 01/17/2014 1551   HGBUR SMALL (A) 07/06/2019 0144   BILIRUBINUR NEGATIVE 07/06/2019 0144   BILIRUBINUR Negative 01/17/2014 1551   KETONESUR NEGATIVE 07/06/2019 0144   PROTEINUR >=300 (A) 07/06/2019 0144   NITRITE NEGATIVE 07/06/2019 0144   LEUKOCYTESUR NEGATIVE 07/06/2019 0144   LEUKOCYTESUR 1+ 01/17/2014 1551    Radiological Exams on Admission: DG Chest Portable 1 View  Result Date: 04/08/2020 CLINICAL DATA:  Missed dialysis EXAM: PORTABLE CHEST 1 VIEW COMPARISON:  01/09/2020 FINDINGS: Interstitial prominence and ill-defined increased density bilaterally. Probable trace left pleural effusion. Cardiomegaly. IMPRESSION: Cardiomegaly with findings suggestive of pulmonary edema. Electronically Signed   By: Macy Mis M.D.   On: 04/08/2020 08:20    EKG: Independently reviewed.   Ectopic atrial rhythm  Assessment/Plan Principal Problem:   Acute metabolic encephalopathy Active Problems:   GERD (gastroesophageal reflux disease)   CVA (cerebral vascular accident) ([place])   ESRD on dialysis ([place])   DM (diabetes mellitus), type 2 with complications ([place])   Hypoglycemia due to insulin   Lactic acidosis   Anemia due to end stage renal disease ([place])      Acute metabolic encephalopathy Most likely secondary to hypoglycemia Patient had a blood  sugar of 28 per EMS and upon arrival to the ER received 2 A of D50 with improvement in his blood sugars Blood sugar checks every 2 hours and if stable will change to every 4 hours    Diabetes mellitus with complications of end-stage renal disease and hypoglycemia Patient is dialysis days are Tuesday/Thursday/Saturday We will request nephrology consult for renal replacement therapy during this admission Patient has frequent hypoglycemic episodes and will require adjustment in his insulin dose upon discharge    Lactic acidosis Unclear etiology Patient does not meet sepsis criteria at this time and has an isolated elevated lactic acid level as well as hypothermia No obvious source of sepsis at this time Will obtain CT angiogram to rule out mesenteric ischemia We will trend lactic acid levels    Anemia of end-stage renal disease Patient's hemoglobin on admission is 7.0g/dl Hemoglobin was 8.9g/dl on 11/15 most likely was after a blood transfusion Patient may require further blood transfusion during this hospitalization We will type and screen Continue iron supplementation   Depression Continue Remeron and Zoloft    Chronic systolic heart failure Continue Entresto, carvedilol and hydralazine   DVT prophylaxis: SCD Code Status: Full code Family Communication: Greater than 50% of time was spent discussing patient's condition and plan of care with his wife Jayzon Taras over the phone.  All questions and concerns have been addressed.  She is particularly concerned about his frequent hypoglycemic episodes.  CODE STATUS was discussed and he is a full code Disposition Plan: Back to previous home environment Consults called: Nephrology    Jahmar Mckelvy MD [facility]     04/08/2020, 10:24 AM

## 2020-04-08 NOTE — Consult Note (Signed)
PHARMACY -  BRIEF ANTIBIOTIC NOTE   Pharmacy has received consult(s) for vancomycin & cefepime from an ED provider.  The patient's profile has been reviewed for ht/wt/allergies/indication/available labs.    One time order(s) placed for: - Cefepime 2g x1 - vancomycin 1g x1  Further antibiotics/pharmacy consults should be ordered by admitting physician if indicated.                       Thank you, Lorna Dibble 04/08/2020  8:46 AM

## 2020-04-08 NOTE — TOC Initial Note (Signed)
Transition of Care [facility]) - Initial/Assessment Note    Patient Details  Name: [name] MRN: [id number] Date of Birth: 01-30-1961  [facility]) CM/SW Contact:    Eileen Stanford, LCSW Phone Number: 04/08/2020, 3:15 PM  Clinical Narrative:    Pt only alert and oriented x2. CSW spoke with pt's spouse via telephone. Pt lives at home with spouse. Pt's spouse transports pt to all of his medical appointments. Pt uses [facility] in [place] and his spouse calls in all his refills. Pt's spouse states pt was getting HH but it had ended prior to admission. Pt's spouse states he was getting HH via [facility]. Pt's spouse states she is agreeable to [facility] and to use [facility] again if this is the d/c recommendation. CSW will follow for dispo needs.              Expected Discharge Plan: [place] Barriers to Discharge: Continued Medical Work up   Patient Goals and CMS Choice Patient states their goals for this hospitalization and ongoing recovery are:: for patient to get better- per spouse      Expected Discharge Plan and Services Expected Discharge Plan: [place] In-house Referral: NA   Post Acute Care Choice:  (undetermined at this time) Living arrangements for the past 2 months: Single Family Home                                      Prior Living Arrangements/Services Living arrangements for the past 2 months: Single Family Home Lives with:: Spouse Patient language and need for interpreter reviewed:: Yes Do you feel safe going back to the place where you live?: Yes      Need for Family Participation in Patient Care: Yes (Comment) Care giver support system in place?: Yes (comment)   Criminal Activity/Legal Involvement Pertinent to Current Situation/Hospitalization: No - Comment as needed  Activities of Daily Living Home Assistive Devices/Equipment: Cane (specify quad or straight), Walker (specify type) ADL Screening  (condition at time of admission) Patient's cognitive ability adequate to safely complete daily activities?: No Is the patient deaf or have difficulty hearing?: No Does the patient have difficulty seeing, even when wearing glasses/contacts?: No Does the patient have difficulty concentrating, remembering, or making decisions?: Yes Patient able to express need for assistance with ADLs?: Yes Does the patient have difficulty dressing or bathing?: No Independently performs ADLs?: No Communication: Needs assistance Is this a change from baseline?: Pre-admission baseline Dressing (OT): Needs assistance Is this a change from baseline?: Pre-admission baseline Grooming: Needs assistance Is this a change from baseline?: Pre-admission baseline Feeding: Needs assistance Is this a change from baseline?: Pre-admission baseline Bathing: Needs assistance Is this a change from baseline?: Pre-admission baseline Toileting: Needs assistance Is this a change from baseline?: Pre-admission baseline In/Out Bed: Dependent Is this a change from baseline?: Pre-admission baseline Walks in Home: Needs assistance Is this a change from baseline?: Pre-admission baseline Does the patient have difficulty walking or climbing stairs?: Yes Weakness of Legs: Both Weakness of Arms/Hands: Both  Permission Sought/Granted Permission sought to share information with : Family Supports    Share Information with NAME: Jacquline  Permission granted to share info w AGENCY: [facility]  Permission granted to share info w Relationship: spouse     Emotional Assessment Appearance:: Appears stated age Attitude/Demeanor/Rapport: Unable to Assess Affect (typically observed): Unable to Assess  Orientation: : Oriented to Place, Oriented to Self Alcohol / Substance Use: Not Applicable Psych Involvement: No (comment)  Admission diagnosis:  Lactic acidosis [E87.2] Hypoglycemia [E16.2] Hypothermia, initial encounter [T68.XXXA] Acute  metabolic encephalopathy [W09.81] Sepsis without acute organ dysfunction, due to unspecified organism [facility]) [A41.9] Patient Active Problem List   Diagnosis Date Noted   Hypoglycemia due to insulin 04/08/2020   Lactic acidosis 04/08/2020   Anemia due to end stage renal disease ([place]) 04/08/2020   Acute on chronic systolic CHF (congestive heart failure) ([place]) 01/10/2020   Severe mitral regurgitation 01/10/2020   Acute on chronic congestive heart failure ([place])    Acute on chronic respiratory failure with hypoxemia ([place]) 12/29/2019   Aspiration pneumonia of both lower lobes due to gastric secretions ([place]) 12/29/2019   Diabetic foot infection ([place]) 12/07/2019   Acute pulmonary edema ([place]) 12/07/2019   Paronychia of second toe of left foot 11/09/2019   Chronic osteomyelitis of toe of left foot ([place]) 11/09/2019   DM (diabetes mellitus), type 2 with complications ([place]) 19/14/7829   Hypoglycemia associated with diabetes ([place]) 08/24/2019   Acute respiratory failure with hypoxia ([facility])    Acute metabolic encephalopathy 56/21/3086   Hypertensive emergency 07/06/2019   Acute encephalopathy 07/06/2019   Seizures due to metabolic disorder ([place]) 57/84/6962   Hyperosmolar hyperglycemic state (HHS) ([place]) 06/05/2019   Symptomatic anemia 06/04/2019   Frequent falls 06/04/2019   History of CVA (cerebrovascular accident) 06/04/2019   HCAP (healthcare-associated pneumonia) 06/04/2019   Acute blood loss anemia 06/04/2019   Hyperglycemia 05/28/2019   Fall at home, initial encounter 05/28/2019   Rib fracture 05/28/2019   Depression 05/28/2019   Hypokalemia 05/28/2019   Weakness 05/27/2019   ESRD on dialysis [facility])    Acute renal failure (ARF) ([place]) 04/09/2019   [place] of ascending colon    Diarrhea    AKI (acute kidney injury) ([place]) 03/04/2019   Acute kidney injury ([place]) 07/26/2018   ARF (acute renal failure) ([place]) 07/25/2018   Bilateral leg numbness 03/19/2018    Numbness and tingling of both feet 03/19/2018   CVA (cerebral vascular accident) ([place]) 03/17/2018   Moderate episode of recurrent major depressive disorder ([place]) 03/11/2018   UTI (urinary tract infection) 02/27/2018   Hypoglycemia 01/15/2018   Type 2 diabetes mellitus without complication, with long-term current use of insulin ([place]) 08/27/2017   Protein-calorie malnutrition, severe 08/19/2017   Pancreatitis, acute 08/16/2017   DKA (diabetic ketoacidoses) 08/16/2017   HTN (hypertension) 08/16/2017   HLD (hyperlipidemia) 08/16/2017   Carotid stenosis 08/02/2016   GERD (gastroesophageal reflux disease) 08/02/2016   History of esophagogastroduodenoscopy (EGD) 07/01/2016   History of recent blood transfusion 07/01/2016   Acute renal failure superimposed on stage 4 chronic kidney disease ([place]) 07/01/2016   Hyponatremia 07/01/2016   Uncontrolled diabetes mellitus ([place]) 07/01/2016   Alcohol abuse 07/01/2016   Monilial esophagitis ([place]) 07/01/2016   Tobacco abuse 07/01/2016   Confusion 06/29/2016   Iron deficiency anemia 06/29/2016   Coagulopathy ([place]) 06/29/2016   PCP:  [name], MD Pharmacy:   [id number] - [address], [address] AT [address] [place] [address] Phone: [phone] Fax: [phone]     Social Determinants of Health (SDOH) Interventions    Readmission Risk Interventions Readmission Risk Prevention Plan 04/08/2020 12/29/2019 12/09/2019  Transportation Screening Complete Complete Complete  PCP or Specialist Appt within 3-5 Days - - -  HRI or [place] - - -  Palliative Care Screening - - -  Medication Review (RN Care Manager) Complete Complete Complete  PCP or Specialist appointment within 3-5 days of discharge Complete Complete Complete  HRI or Home Care Consult Complete Complete Complete  SW Recovery Care/Counseling Consult Complete Complete Complete  Palliative  Care Screening Not Applicable Not Applicable Not Applicable  [place] Not Applicable Not Applicable Not Applicable  Some recent data might be hidden

## 2020-04-08 NOTE — ED Triage Notes (Signed)
Pt ems from home for hypoglycemia. Per ems cbg was 28 at home.

## 2020-04-08 NOTE — Progress Notes (Signed)
11/26 Elink tracking Code Sepsis. Total fluid resuscitation will be 1,830 cc.

## 2020-04-08 NOTE — ED Notes (Signed)
Bair hugger applied.

## 2020-04-08 NOTE — ED Provider Notes (Signed)
[facility] Emergency Department Provider Note ____________________________________________   First MD Initiated Contact with Patient 04/08/20 [phone]     (approximate)  I have reviewed the triage vital signs and the nursing notes.  HISTORY  Chief Complaint Hypoglycemia   HPI [name] is a 59 y.o. malewho presents to the ED for evaluation of hypoglycemia.  Chart review indicates history of ESRD on hemodialysis T/Th/S, patient reports last going to dialysis last week on Thursday.  Patient on 4 L nasal cannula at baseline.  History of CHF and pulmonary hypertension, DM on insulin and alcohol abuse.  Patient presents to the ED via EMS from home for hypoglycemia.  EMS reports that family is unable to provide any significant history.  EMS reports that they had no concerns for recent illnesses.  Patient is unable to provide any significant history due to his altered mental status.  Indicates that he last went to dialysis last week and he thinks today is Saturday.   Past Medical History:  Diagnosis Date  . Chronic kidney disease   . Diabetes mellitus without complication ([place])   . ETOH abuse   . GERD (gastroesophageal reflux disease)   . Hyperlipidemia   . Hypertension   . Stroke [facility])     Patient Active Problem List   Diagnosis Date Noted  . Acute on chronic systolic CHF (congestive heart failure) ([place]) 01/10/2020  . Severe mitral regurgitation 01/10/2020  . Acute on chronic congestive heart failure ([place])   . Acute on chronic respiratory failure with hypoxemia ([place]) 12/29/2019  . Aspiration pneumonia of both lower lobes due to gastric secretions ([place]) 12/29/2019  . Diabetic foot infection ([place]) 12/07/2019  . Acute pulmonary edema ([place]) 12/07/2019  . Paronychia of second toe of left foot 11/09/2019  . Chronic osteomyelitis of toe of left foot ([place]) 11/09/2019  . DM (diabetes mellitus), type 2 with complications ([place]) 08/65/7846  . Hypoglycemia  associated with diabetes ([place]) 08/24/2019  . Acute respiratory failure with hypoxia ([place])   . Acute metabolic encephalopathy 96/29/5284  . Hypertensive emergency 07/06/2019  . Acute encephalopathy 07/06/2019  . Seizures due to metabolic disorder ([place]) 13/24/4010  . Hyperosmolar hyperglycemic state (HHS) ([place]) 06/05/2019  . Symptomatic anemia 06/04/2019  . Frequent falls 06/04/2019  . History of CVA (cerebrovascular accident) 06/04/2019  . HCAP (healthcare-associated pneumonia) 06/04/2019  . Acute blood loss anemia 06/04/2019  . Hyperglycemia 05/28/2019  . Fall at home, initial encounter 05/28/2019  . Rib fracture 05/28/2019  . Depression 05/28/2019  . Hypokalemia 05/28/2019  . Weakness 05/27/2019  . ESRD on dialysis ([place])   . Acute renal failure (ARF) ([place]) 04/09/2019  . Polyp of ascending colon   . Diarrhea   . AKI (acute kidney injury) ([place]) 03/04/2019  . Acute kidney injury ([place]) 07/26/2018  . ARF (acute renal failure) ([place]) 07/25/2018  . Bilateral leg numbness 03/19/2018  . Numbness and tingling of both feet 03/19/2018  . CVA (cerebral vascular accident) ([place]) 03/17/2018  . Moderate episode of recurrent major depressive disorder ([place]) 03/11/2018  . UTI (urinary tract infection) 02/27/2018  . Hypoglycemia 01/15/2018  . Type 2 diabetes mellitus without complication, with long-term current use of insulin ([place]) 08/27/2017  . Protein-calorie malnutrition, severe 08/19/2017  . Pancreatitis, acute 08/16/2017  . DKA (diabetic ketoacidoses) 08/16/2017  . HTN (hypertension) 08/16/2017  . HLD (hyperlipidemia) 08/16/2017  . Carotid stenosis 08/02/2016  . GERD (gastroesophageal reflux disease) 08/02/2016  . History of esophagogastroduodenoscopy (EGD) 07/01/2016  . History of recent  blood transfusion 07/01/2016  . Acute renal failure superimposed on stage 4 chronic kidney disease ([place]) 07/01/2016  . Hyponatremia 07/01/2016  . Uncontrolled diabetes mellitus ([place]) 07/01/2016  .  Alcohol abuse 07/01/2016  . Monilial esophagitis ([place]) 07/01/2016  . Tobacco abuse 07/01/2016  . Confusion 06/29/2016  . Iron deficiency anemia 06/29/2016  . Coagulopathy ([place]) 06/29/2016    Past Surgical History:  Procedure Laterality Date  . AMPUTATION TOE Left 12/11/2019   Procedure: AMPUTATION LEFT 2nd TOE;  Surgeon: Sharlotte Alamo, DPM;  Location: [facility] ORS;  Service: Podiatry;  Laterality: Left;  . AV FISTULA PLACEMENT Right 11/25/2019   Procedure: INSERTION OF ARTERIOVENOUS (AV) GORE-TEX GRAFT ARM;  Surgeon: Katha Cabal, MD;  Location: [facility] ORS;  Service: Vascular;  Laterality: Right;  . COLONOSCOPY WITH PROPOFOL N/A 03/06/2019   Procedure: COLONOSCOPY WITH PROPOFOL;  Surgeon: Lucilla Lame, MD;  Location: [facility] ENDOSCOPY;  Service: Endoscopy;  Laterality: N/A;  . DIALYSIS/PERMA CATHETER INSERTION N/A 04/10/2019   Procedure: DIALYSIS/PERMA CATHETER INSERTION;  Surgeon: Serafina Mitchell, MD;  Location: [place] CV LAB;  Service: Cardiovascular;  Laterality: N/A;  . DIALYSIS/PERMA CATHETER REMOVAL N/A 03/07/2020   Procedure: DIALYSIS/PERMA CATHETER REMOVAL;  Surgeon: Algernon Huxley, MD;  Location: [place] CV LAB;  Service: Cardiovascular;  Laterality: N/A;  . ESOPHAGOGASTRODUODENOSCOPY (EGD) WITH PROPOFOL N/A 07/01/2016   Procedure: ESOPHAGOGASTRODUODENOSCOPY (EGD) WITH PROPOFOL;  Surgeon: San Jetty, MD;  Location: [facility] ENDOSCOPY;  Service: General;  Laterality: N/A;  . ESOPHAGOGASTRODUODENOSCOPY (EGD) WITH PROPOFOL N/A 12/25/2019   Procedure: ESOPHAGOGASTRODUODENOSCOPY (EGD) WITH PROPOFOL;  Surgeon: Lin Landsman, MD;  Location: [facility] ENDOSCOPY;  Service: Gastroenterology;  Laterality: N/A;  . LOWER EXTREMITY ANGIOGRAPHY Left 12/10/2019   Procedure: Lower Extremity Angiography;  Surgeon: Algernon Huxley, MD;  Location: [place] [facility] LAB;  Service: Cardiovascular;  Laterality: Left;    Prior to Admission medications   Medication Sig Start Date End Date Taking?  Authorizing Provider  aspirin EC 81 MG EC tablet Take 1 tablet (81 mg total) by mouth daily. 06/10/19   Jennye Boroughs, MD  carvedilol (COREG) 6.25 MG tablet Take 6.25 mg by mouth 2 (two) times daily with a meal.  10/28/19   [provider]  ENTRESTO 24-26 MG Take 1 tablet by mouth 2 (two) times daily. 07/29/19   [provider]  epoetin alfa (EPOGEN) 10000 UNIT/ML injection Inject 0.4 mLs (4,000 Units total) into the vein Every Tuesday,Thursday,and Saturday with dialysis. 01/05/20   Enzo Bi, MD  folic acid (FOLVITE) 1 MG tablet Take 1 mg by mouth daily. 03/12/19   [provider]  hydrALAZINE (APRESOLINE) 25 MG tablet Take 1 tablet (25 mg total) by mouth in the morning and at bedtime. 01/13/20   Jennye Boroughs, MD  insulin glargine (LANTUS) 100 UNIT/ML injection Inject 0.04 mLs (4 Units total) into the skin daily. Patient taking differently: Inject 10 Units into the skin daily.  01/04/20   Enzo Bi, MD  insulin lispro (HUMALOG KWIKPEN) 100 UNIT/ML KwikPen Inject 0.03 mLs (3 Units total) into the skin 3 (three) times daily with meals. 01/04/20   Enzo Bi, MD  iron polysaccharides (NIFEREX) 150 MG capsule Take 1 capsule (150 mg total) by mouth daily. 01/05/20 04/04/20  Enzo Bi, MD  Melatonin 3 MG TABS Take 1 tablet by mouth at bedtime. 05/23/19   [provider]  multivitamin (RENA-VIT) TABS tablet Take 1 tablet by mouth daily.    [provider]  Nutritional Supplements (FEEDING SUPPLEMENT, NEPRO CARB STEADY,) LIQD Take  237 mLs by mouth 3 (three) times daily between meals. Patient not taking: Reported on 03/07/2020 07/08/19   Nicole Kindred A, DO  sertraline (ZOLOFT) 100 MG tablet Take 100 mg by mouth daily.  06/17/19   [provider]  thiamine (VITAMIN B-1) 100 MG tablet Take 100 mg by mouth daily. Patient not taking: Reported on 03/07/2020    [provider]    Allergies Ferrous gluconate and Other  Family History  Problem Relation  Age of Onset  . CAD Brother   . Dementia Mother   . Renal Disease Father     Social History Social History   Tobacco Use  . Smoking status: Current Every Day Smoker    Packs/day: 0.25    Years: 5.00    Pack years: 1.25    Types: Cigars  . Smokeless tobacco: Never Used  Vaping Use  . Vaping Use: Never used  Substance Use Topics  . Alcohol use: Yes    Alcohol/week: 16.0 standard drinks    Types: 14 Cans of beer, 2 Shots of liquor per week  . Drug use: No    Review of Systems  Unable to be accurately obtained due to patient's altered mentation.  ____________________________________________   PHYSICAL EXAM:  VITAL SIGNS: Vitals:   04/08/20 0815 04/08/20 0830  BP:  100/78  Pulse: 89 76  Resp: 14 11  Temp: (!) 90.5 F (32.5 C) (!) 91 F (32.8 C)  SpO2: (!) 87% 91%     Constitutional: Supine in bed, shivering, opens eyes to voice and answers questions inappropriately.  Does follow some simple commands without apparent neurologic deficits.  Disoriented to time, location and situation Eyes: Conjunctivae are normal. PERRL. EOMI. Head: Atraumatic. Nose: No congestion/rhinnorhea. Mouth/Throat: Mucous membranes are dry.  Oropharynx non-erythematous. Neck: No stridor. No cervical spine tenderness to palpation. Cardiovascular: Normal rate, regular rhythm. Grossly normal heart sounds.  Good peripheral circulation. Respiratory: Normal respiratory effort.  No retractions. Lungs CTAB.  No distress or tachypnea. Gastrointestinal: Soft , nondistended, nontender to palpation. No CVA tenderness. Musculoskeletal: No lower extremity tenderness nor edema.  No joint effusions. No signs of acute trauma. Neurologic:  Normal speech and language. No gross focal neurologic deficits are appreciated.  Skin:  Skin is cool, dry and intact. No rash noted. Psychiatric: Mood and affect are normal.   ____________________________________________   LABS (all labs ordered are listed, but only  abnormal results are displayed)  Labs Reviewed  COMPREHENSIVE METABOLIC PANEL - Abnormal; Notable for the following components:      Result Value   Glucose, Bld 328 (*)    BUN 35 (*)    Creatinine, Ser 4.01 (*)    Calcium 8.3 (*)    Albumin 2.8 (*)    AST 44 (*)    Alkaline Phosphatase 221 (*)    GFR, Estimated 16 (*)    All other components within normal limits  CBC WITH DIFFERENTIAL/PLATELET - Abnormal; Notable for the following components:   RBC 2.62 (*)    Hemoglobin 7.0 (*)    HCT 24.4 (*)    MCHC 28.7 (*)    RDW 16.7 (*)    All other components within normal limits  MAGNESIUM - Abnormal; Notable for the following components:   Magnesium 2.7 (*)    All other components within normal limits  LACTIC ACID, PLASMA - Abnormal; Notable for the following components:   Lactic Acid, Venous 6.6 (*)    All other components within normal limits  CBG MONITORING,  ED - Abnormal; Notable for the following components:   Glucose-Capillary <10 (*)    All other components within normal limits  CBG MONITORING, ED - Abnormal; Notable for the following components:   Glucose-Capillary <10 (*)    All other components within normal limits  CBG MONITORING, ED - Abnormal; Notable for the following components:   Glucose-Capillary 162 (*)    All other components within normal limits  CULTURE, BLOOD (ROUTINE X 2)  CULTURE, BLOOD (ROUTINE X 2)  RESP PANEL BY RT-PCR (FLU A&B, COVID) [id number]   ____________________________________________  12 Lead EKG  Sinus rhythm, rate of 82 bpm.  Normal axis.  QTC of 514 ms and otherwise normal intervals.  No evidence of acute ischemia. ____________________________________________  RADIOLOGY  ED MD interpretation: 1 view CXR reviewed by me with cardiomegaly and pulmonary vascular congestion without discrete lobar infiltration.  Official radiology report(s): DG Chest Portable 1 View  Result Date: 04/08/2020 CLINICAL DATA:  Missed dialysis EXAM: PORTABLE  CHEST 1 VIEW COMPARISON:  01/09/2020 FINDINGS: Interstitial prominence and ill-defined increased density bilaterally. Probable trace left pleural effusion. Cardiomegaly. IMPRESSION: Cardiomegaly with findings suggestive of pulmonary edema. Electronically Signed   By: Macy Mis M.D.   On: 04/08/2020 08:20    ____________________________________________   PROCEDURES and INTERVENTIONS  Procedure(s) performed (including Critical Care):  Ultrasound ED Peripheral IV (Provider)  Date/Time: 04/08/2020 8:16 AM Performed by: Vladimir Crofts, MD Authorized by: Vladimir Crofts, MD   Procedure details:    Indications: multiple failed IV attempts and poor IV access     Skin Prep: chlorhexidine gluconate     Location: left basilic v.   Angiocath:  20 G   Bedside Ultrasound Guided: Yes     Images: not archived     Patient tolerated procedure without complications: Yes     Dressing applied: Yes   .Critical Care Performed by: Vladimir Crofts, MD Authorized by: Vladimir Crofts, MD   Critical care provider statement:    Critical care time (minutes):  45   Critical care was necessary to treat or prevent imminent or life-threatening deterioration of the following conditions:  Endocrine crisis, sepsis, CNS failure or compromise and metabolic crisis   Critical care was time spent personally by me on the following activities:  Discussions with consultants, evaluation of patient's response to treatment, examination of patient, ordering and performing treatments and interventions, ordering and review of laboratory studies, ordering and review of radiographic studies, pulse oximetry, re-evaluation of patient's condition, obtaining history from patient or surrogate and review of old charts .1-3 Lead EKG Interpretation Performed by: Vladimir Crofts, MD Authorized by: Vladimir Crofts, MD     Interpretation: normal     ECG rate:  74   ECG rate assessment: normal     Rhythm: sinus rhythm     Ectopy: none     Conduction:  normal      Medications  dextrose 10 % and 0.45 % NaCl infusion (has no administration in time range)  ceFEPIme (MAXIPIME) 2 g in sodium chloride 0.9 % 100 mL IVPB (has no administration in time range)  vancomycin (VANCOCIN) IVPB 1000 mg/200 mL premix (has no administration in time range)  dextrose 50 % solution 25 g (25 g Intravenous Given 04/08/20 0815)  dextrose 50 % solution 25 g (25 g Intravenous Given 04/08/20 0800)    ____________________________________________   MDM / ED COURSE   59 year old dialysis patient presents from home with altered mental status and hypoglycemia, found to have evidence of sepsis, requiring  medical admission.  Patient hypothermic with a core temperature of 91 F, but hemodynamically stable.  Exam demonstrates a disoriented patient with a nonfocal neurologic exam without evidence of trauma or septic etiology.  He indicates no recent symptoms or illnesses, and he has no signs of infectious pathology on my examination.  He has a benign abdomen and no skin lesions to his feet, back.  Blood work demonstrates known ESRD, but a lactic acid elevated to 6.6.  Blood cultures were drawn and broad-spectrum antibiotics were provided for sepsis coverage.  Patient did not receive full 20 cc/kg fluid bolus due to his volume overload state of missing dialysis and congested CXR.  We will gently provide fluids via continuous infusion to help with his hypoglycemia and clear his lactic acid.  Will admit to hospitalist medicine for further work-up and management.   Clinical Course as of Apr 08 853  [facility] Apr 08, 2020  0750 USIV placed by me and D50 provided.  Subsequent improvement of his mental status.  Majority of history is obtained at this time.   [DS]  5465 Repeat glucose still "low" and rectal temperature 91 degrees.  Another amp of D50 provided, bear hugger applied.  Blood cultures ordered.   [DS]  0830 Serum glucose elevated to the 300s despite repeat fingersticks of less  than 10.  We will recheck off of an IV on future rechecks because of concern his fingersticks are inaccurate.  While reassessing him, his mental status is improved and he looks better clinically than presentation.  BP is soft with maps in the 70s.  Call from the lab while I am in the room indicating his lactic is greater than 6.  Antibiotics ordered   [DS]  0354 While reassessing the patient, admitting hospitalist walks by the room after seeing another patient of mine.  Dr. Francine Graven indicates that she is still taking admissions, and we discussed the patient at the bedside.  She agrees to admit him to her service   [DS]    Clinical Course User Index [DS] Vladimir Crofts, MD    ____________________________________________   FINAL CLINICAL IMPRESSION(S) / ED DIAGNOSES  Final diagnoses:  Hypoglycemia  Hypothermia, initial encounter  Sepsis without acute organ dysfunction, due to unspecified organism [facility])  Lactic acidosis     ED Discharge Orders    None       Ming Kunka   Note:  This document was prepared using Dragon voice recognition software and may include unintentional dictation errors.   Vladimir Crofts, MD 04/08/20 [phone]

## 2020-04-08 NOTE — Consult Note (Signed)
CODE SEPSIS - PHARMACY COMMUNICATION  **Broad Spectrum Antibiotics should be administered within 1 hour of Sepsis diagnosis**  Time Code Sepsis Called/Page Received: 4320  Antibiotics Ordered: vancomycin & cefepime  Time of 1st antibiotic administration: 0905  Additional action taken by pharmacy: N/A  If necessary, Name of Provider/Nurse Contacted: N/A    Lorna Dibble ,PharmD Clinical Pharmacist  04/08/2020  8:49 AM

## 2020-04-08 NOTE — Progress Notes (Signed)
elink requested repeat lactate per Sepsis protocol.

## 2020-04-08 NOTE — Progress Notes (Signed)
[facility]  ROUNDING NOTE   Subjective:   [name] was admitted to [facility] on 04/08/2020 for Acute metabolic encephalopathy [G25.42] Found to have hypoglycemia and lactic acidosis. Patient is unable to give a history.   Last hemodialysis treatment was 11/24. Left above his dry weight at 61.7kg. However today he does not appear to be volume overloaded.    Objective:  Vital signs in last 24 hours:  Temp:  [90.5 F (32.5 C)-94 F (34.4 C)] 94 F (34.4 C) (11/26 1045) Pulse Rate:  [63-89] 73 (11/26 1045) Resp:  [7-15] 15 (11/26 1045) BP: (100-123)/(59-97) 112/59 (11/26 1045) SpO2:  [87 %-97 %] 95 % (11/26 1045) Weight:  [61 kg] 61 kg (11/26 0814)  Weight change:  Filed Weights   04/08/20 0814  Weight: 61 kg    Intake/Output: No intake/output data recorded.   Intake/Output this shift:  Total I/O In: 793.9 [IV Piggyback:793.9] Out: -   Physical Exam: General: Ill appearing  Head: Normocephalic, atraumatic. Moist oral mucosal membranes  Eyes: Anicteric, PERRL  Neck: Supple, trachea midline  Lungs:  Clear to auscultation  Heart: Regular rate and rhythm  Abdomen:  Soft, nontender,   Extremities:  no peripheral edema.  Neurologic: Oriented to self and place only  Skin: No lesions  Access: Right AVF    Basic Metabolic Panel: Recent Labs  Lab 04/08/20 0758  NA 138  K 4.8  CL 99  CO2 25  GLUCOSE 328*  BUN 35*  CREATININE 4.01*  CALCIUM 8.3*  MG 2.7*    Liver Function Tests: Recent Labs  Lab 04/08/20 0758  AST 44*  ALT 39  ALKPHOS 221*  BILITOT 0.7  PROT 6.6  ALBUMIN 2.8*   No results for input(s): LIPASE, AMYLASE in the last 168 hours. No results for input(s): AMMONIA in the last 168 hours.  CBC: Recent Labs  Lab 04/08/20 0758  WBC 8.3  NEUTROABS 6.5  HGB 7.0*  HCT 24.4*  MCV 93.1  PLT 188    Cardiac Enzymes: No results for input(s): CKTOTAL, CKMB, CKMBINDEX, TROPONINI in the last 168 hours.  BNP: Invalid input(s):  POCBNP  CBG: Recent Labs  Lab 04/08/20 0740 04/08/20 0800 04/08/20 0823 04/08/20 0902 04/08/20 1053  GLUCAP <10* <10* 162* 301* 199*    Microbiology: Results for orders placed or performed during the hospital encounter of 04/08/20  Resp Panel by RT-PCR (Flu A&B, Covid) Nasopharyngeal Swab     Status: None   Collection Time: 04/08/20  8:45 AM   Specimen: Nasopharyngeal Swab; Nasopharyngeal(NP) swabs in vial transport medium  Result Value Ref Range Status   SARS Coronavirus 2 by RT PCR NEGATIVE NEGATIVE Final    Comment: (NOTE) SARS-CoV-2 target nucleic acids are NOT DETECTED.  The SARS-CoV-2 RNA is generally detectable in upper respiratory specimens during the acute phase of infection. The lowest concentration of SARS-CoV-2 viral copies this assay can detect is 138 copies/mL. A negative result does not preclude SARS-Cov-2 infection and should not be used as the sole basis for treatment or other patient management decisions. A negative result may occur with  improper specimen collection/handling, submission of specimen other than nasopharyngeal swab, presence of viral mutation(s) within the areas targeted by this assay, and inadequate number of viral copies(<138 copies/mL). A negative result must be combined with clinical observations, patient history, and epidemiological information. The expected result is Negative.  Fact Sheet for Patients:  EntrepreneurPulse.com.au  Fact Sheet for Healthcare Providers:  IncredibleEmployment.be  This test is no  t yet approved or cleared by the [place] and  has been authorized for detection and/or diagnosis of SARS-CoV-2 by FDA under an Emergency Use Authorization (EUA). This EUA will remain  in effect (meaning this test can be used) for the duration of the COVID-19 declaration under Section 564(b)(1) of the Act, 21 U.S.C.section 360bbb-3(b)(1), unless the authorization is terminated  or  revoked sooner.       Influenza A by PCR NEGATIVE NEGATIVE Final   Influenza B by PCR NEGATIVE NEGATIVE Final    Comment: (NOTE) The Xpert Xpress SARS-CoV-2/FLU/RSV plus assay is intended as an aid in the diagnosis of influenza from Nasopharyngeal swab specimens and should not be used as a sole basis for treatment. Nasal washings and aspirates are unacceptable for Xpert Xpress SARS-CoV-2/FLU/RSV testing.  Fact Sheet for Patients: EntrepreneurPulse.com.au  Fact Sheet for Healthcare Providers: IncredibleEmployment.be  This test is not yet approved or cleared by the [place] FDA and has been authorized for detection and/or diagnosis of SARS-CoV-2 by FDA under an Emergency Use Authorization (EUA). This EUA will remain in effect (meaning this test can be used) for the duration of the COVID-19 declaration under Section 564(b)(1) of the Act, 21 U.S.C. section 360bbb-3(b)(1), unless the authorization is terminated or revoked.  Performed at [facility], [address] [place], [place] [address]     Coagulation Studies: No results for input(s): LABPROT, INR in the last 72 hours.  Urinalysis: No results for input(s): COLORURINE, LABSPEC, PHURINE, GLUCOSEU, HGBUR, BILIRUBINUR, KETONESUR, PROTEINUR, UROBILINOGEN, NITRITE, LEUKOCYTESUR in the last 72 hours.  Invalid input(s): APPERANCEUR    Imaging: DG Chest Portable 1 View  Result Date: 04/08/2020 CLINICAL DATA:  Missed dialysis EXAM: PORTABLE CHEST 1 VIEW COMPARISON:  01/09/2020 FINDINGS: Interstitial prominence and ill-defined increased density bilaterally. Probable trace left pleural effusion. Cardiomegaly. IMPRESSION: Cardiomegaly with findings suggestive of pulmonary edema. Electronically Signed   By: Macy Mis M.D.   On: 04/08/2020 08:20     Medications:   . dextrose     . aspirin  81 mg Oral Daily  . carvedilol  6.25 mg Oral BID WC  . [START ON 04/09/2020]  epoetin alfa  4,000 Units Intravenous Q T,Th,Sa-HD  . feeding supplement (NEPRO CARB STEADY)  237 mL Oral TID BM  . folic acid  1 mg Oral Daily  . hydrALAZINE  25 mg Oral BID  . iron polysaccharides  150 mg Oral Daily  . lidocaine-prilocaine  1 application Topical UD  . melatonin  3 mg Oral QHS  . mirtazapine  7.5 mg Oral QHS  . multivitamin  1 tablet Oral Daily  . sacubitril-valsartan  1 tablet Oral BID  . sertraline  100 mg Oral Daily   acetaminophen **OR** acetaminophen, ondansetron **OR** ondansetron (ZOFRAN) IV  Assessment/ Plan:  Mr. SERGE MAIN is a 59 y.o. black male with end stage renal disease on hemodialysis, hypertension, diabetes mellitus type II, hyperlipidemia, history of alcohol abuse who was admitted on 04/08/2020 for Acute metabolic encephalopathy [Z56.38]  [place]. Right AVF 59.5kg  1. End Stage Renal Disease: last dialysis was on 11/24. He is scheduled for dialysis tomorrow.   2. Lactic acidosis: unclear cause. Concern for underlying infection versus ischemia. Pending CT imaging.   3. Anemia with chronic kidney disease: hemoglobin 7. EPO with HD treatments. Low threshold for PRBC transfusion.  Restarted on oral iron supplements.  4. Hypertension: blood pressure is soft. Home regimen of hydrlazine, Entresto and carvedilol has been  restarted.   5. Secondary Hyperparathyroidism: not currently on phosphate binders. On cinacalcet.    LOS: 0 Ivet Guerrieri 11/26/202111:45 AM

## 2020-04-09 DIAGNOSIS — G9341 Metabolic encephalopathy: Secondary | ICD-10-CM

## 2020-04-09 LAB — BASIC METABOLIC PANEL
Anion gap: 9 (ref 5–15)
BUN: 49 mg/dL — ABNORMAL HIGH (ref 6–20)
CO2: 28 mmol/L (ref 22–32)
Calcium: 8.2 mg/dL — ABNORMAL LOW (ref 8.9–10.3)
Chloride: 101 mmol/L (ref 98–111)
Creatinine, Ser: 4.69 mg/dL — ABNORMAL HIGH (ref 0.61–1.24)
GFR, Estimated: 13 mL/min — ABNORMAL LOW (ref >60)
Glucose, Bld: 112 mg/dL — ABNORMAL HIGH (ref 70–99)
Potassium: 4.9 mmol/L (ref 3.5–5.1)
Sodium: 138 mmol/L (ref 135–145)

## 2020-04-09 LAB — GLUCOSE, CAPILLARY
Glucose-Capillary: 145 mg/dL — ABNORMAL HIGH (ref 70–99)
Glucose-Capillary: 147 mg/dL — ABNORMAL HIGH (ref 70–99)
Glucose-Capillary: 172 mg/dL — ABNORMAL HIGH (ref 70–99)
Glucose-Capillary: 182 mg/dL — ABNORMAL HIGH (ref 70–99)
Glucose-Capillary: 335 mg/dL — ABNORMAL HIGH (ref 70–99)
Glucose-Capillary: 348 mg/dL — ABNORMAL HIGH (ref 70–99)
Glucose-Capillary: 361 mg/dL — ABNORMAL HIGH (ref 70–99)
Glucose-Capillary: 403 mg/dL — ABNORMAL HIGH (ref 70–99)
Glucose-Capillary: 49 mg/dL — ABNORMAL LOW (ref 70–99)

## 2020-04-09 LAB — CBC
HCT: 20.3 % — ABNORMAL LOW (ref 39.0–52.0)
HCT: 25.5 % — ABNORMAL LOW (ref 39.0–52.0)
Hemoglobin: 6.1 g/dL — ABNORMAL LOW (ref 13.0–17.0)
Hemoglobin: 7.5 g/dL — ABNORMAL LOW (ref 13.0–17.0)
MCH: 27 pg (ref 26.0–34.0)
MCH: 26.5 pg (ref 26.0–34.0)
MCHC: 30 g/dL (ref 30.0–36.0)
MCHC: 29.4 g/dL — ABNORMAL LOW (ref 30.0–36.0)
MCV: 89.8 fL (ref 80.0–100.0)
MCV: 90.1 fL (ref 80.0–100.0)
Platelets: 137 10*3/uL — ABNORMAL LOW (ref 150–400)
Platelets: 152 10*3/uL (ref 150–400)
RBC: 2.26 MIL/uL — ABNORMAL LOW (ref 4.22–5.81)
RBC: 2.83 MIL/uL — ABNORMAL LOW (ref 4.22–5.81)
RDW: 16.6 % — ABNORMAL HIGH (ref 11.5–15.5)
RDW: 16.4 % — ABNORMAL HIGH (ref 11.5–15.5)
WBC: 4.9 10*3/uL (ref 4.0–10.5)
WBC: 5.6 10*3/uL (ref 4.0–10.5)
nRBC: 0 % (ref 0.0–0.2)
nRBC: 0 % (ref 0.0–0.2)

## 2020-04-09 LAB — HEPATITIS B SURFACE ANTIGEN: Hepatitis B Surface Ag: NONREACTIVE

## 2020-04-09 LAB — DIFFERENTIAL
Abs Immature Granulocytes: 0.01 10*3/uL (ref 0.00–0.07)
Basophils Absolute: 0 10*3/uL (ref 0.0–0.1)
Basophils Relative: 0 %
Eosinophils Absolute: 0.1 10*3/uL (ref 0.0–0.5)
Eosinophils Relative: 1 %
Immature Granulocytes: 0 %
Lymphocytes Relative: 21 %
Lymphs Abs: 1.2 10*3/uL (ref 0.7–4.0)
Monocytes Absolute: 0.4 10*3/uL (ref 0.1–1.0)
Monocytes Relative: 8 %
Neutro Abs: 3.9 10*3/uL (ref 1.7–7.7)
Neutrophils Relative %: 70 %

## 2020-04-09 MED ORDER — SODIUM CHLORIDE 0.9% IV SOLUTION: Freq: Once | INTRAVENOUS | Status: DC

## 2020-04-09 MED ORDER — INSULIN ASPART 100 UNIT/ML ~~LOC~~ SOLN
5.0000 [IU] | Freq: Once | SUBCUTANEOUS | Status: AC
  Administered 2020-04-09: 5 [IU] via SUBCUTANEOUS
  Filled 2020-04-09: qty 1

## 2020-04-09 MED ORDER — GLUCOSE 40 % PO GEL
ORAL | Status: AC
  Filled 2020-04-09: qty 1

## 2020-04-09 MED ORDER — GLUCOSE 40 % PO GEL
2.0000 | ORAL | Status: AC
  Administered 2020-04-09: 75 g via ORAL

## 2020-04-09 MED ORDER — EPOETIN ALFA 10000 UNIT/ML IJ SOLN
10000.0000 [IU] | INTRAMUSCULAR | Status: DC
  Administered 2020-04-09: 10000 [IU] via INTRAVENOUS

## 2020-04-09 MED ORDER — INSULIN ASPART 100 UNIT/ML ~~LOC~~ SOLN
4.0000 [IU] | Freq: Once | SUBCUTANEOUS | Status: AC
  Administered 2020-04-09: 4 [IU] via SUBCUTANEOUS
  Filled 2020-04-09: qty 1

## 2020-04-09 MED ORDER — CHLORHEXIDINE GLUCONATE CLOTH 2 % EX PADS
6.0000 | MEDICATED_PAD | Freq: Every day | CUTANEOUS | Status: DC
  Administered 2020-04-09 – 2020-04-12 (×4): 6 via TOPICAL

## 2020-04-09 MED ORDER — SERTRALINE HCL 50 MG PO TABS
150.0000 mg | ORAL_TABLET | Freq: Every day | ORAL | Status: DC
  Administered 2020-04-10 – 2020-04-12 (×3): 150 mg via ORAL
  Filled 2020-04-09 (×3): qty 3

## 2020-04-09 MED ORDER — INSULIN ASPART 100 UNIT/ML ~~LOC~~ SOLN
0.0000 [IU] | Freq: Three times a day (TID) | SUBCUTANEOUS | Status: DC
  Administered 2020-04-09: 1 [IU] via SUBCUTANEOUS
  Filled 2020-04-09: qty 1

## 2020-04-09 MED ORDER — FLUOXETINE HCL 20 MG PO CAPS: 20.0000 mg | ORAL_CAPSULE | Freq: Every day | ORAL | Status: DC

## 2020-04-09 MED ORDER — SODIUM CHLORIDE 0.9 % IV SOLN
510.0000 mg | Freq: Once | INTRAVENOUS | Status: AC
  Administered 2020-04-09: 510 mg via INTRAVENOUS
  Filled 2020-04-09: qty 17

## 2020-04-09 NOTE — Progress Notes (Addendum)
Pt blood sugar at 355 notify NP [facility] notified and states will place order. Will continue to monitor.  Update insulin aspart (novolog) injections 4 units subcutaneous once. Will continue to monitor.  Update 2128: Insulin was given. Will continue to monitor.  Update 2230: Pt blood sugar at 403. Notify NP Sharlet Salina. Will continue to monitor.  Update 2330: NP Sharlet Salina ordered 5 units insulin aspart Novolog subq injection once, Will continue to monitor.  Update 0256: Pt states cant get rested from having diarrhea. Pt BM totalled to 9 for day shift and night shift. NP Sharlet Salina notified. Will continue to monitor.  Update 0259: NP Sharlet Salina ordered imodium capsule 2 mg once. Will continue to monitor.  Update 0406: Pt blood sugar at 45. Pt requested to have 2 cups of apple juice and 2 packs graham crackers. Notify NP Sharlet Salina. Will continue to monitor.  Update 0442: Pt blood sugar at 96 NP [facility] notified. Will continue to monitor.

## 2020-04-09 NOTE — Progress Notes (Signed)
[facility]  ROUNDING NOTE   Subjective:   Seen and examined on hemodialysis treatment. Tolerating treatment well.  Mental status has cleared.       Objective:  Vital signs in last 24 hours:  Temp:  [95.1 F (35.1 C)-99.1 F (37.3 C)] 97.8 F (36.6 C) (11/27 0745) Pulse Rate:  [54-95] 83 (11/27 1045) Resp:  [12-18] 14 (11/27 0745) BP: (106-136)/(57-88) 119/79 (11/27 1045) SpO2:  [90 %-100 %] 96 % (11/27 0745) Weight:  [61.4 kg] 61.4 kg (11/27 0449)  Weight change:  Filed Weights   04/08/20 0814 04/09/20 0449  Weight: 61 kg 61.4 kg    Intake/Output: I/O last 3 completed shifts: In: 1390.9 [P.O.:597; IV Piggyback:793.9] Out: 0    Intake/Output this shift:  No intake/output data recorded.  Physical Exam: General: Laying in bed  Head: Normocephalic, atraumatic. Moist oral mucosal membranes  Eyes: Anicteric, PERRL  Neck: Supple, trachea midline  Lungs:  Clear to auscultation  Heart: Regular rate and rhythm  Abdomen:  Soft, nontender,   Extremities:  no peripheral edema.  Neurologic: Oriented  Skin: No lesions  Access: Right AVF    Basic Metabolic Panel: Recent Labs  Lab 04/08/20 0758 04/09/20 0520  NA 138 138  K 4.8 4.9  CL 99 101  CO2 25 28  GLUCOSE 328* 112*  BUN 35* 49*  CREATININE 4.01* 4.69*  CALCIUM 8.3* 8.2*  MG 2.7*  --     Liver Function Tests: Recent Labs  Lab 04/08/20 0758  AST 44*  ALT 39  ALKPHOS 221*  BILITOT 0.7  PROT 6.6  ALBUMIN 2.8*   No results for input(s): LIPASE, AMYLASE in the last 168 hours. No results for input(s): AMMONIA in the last 168 hours.  CBC: Recent Labs  Lab 04/08/20 0758 04/09/20 0520 04/09/20 0855  WBC 8.3 4.9 5.6  NEUTROABS 6.5  --  3.9  HGB 7.0* 6.1* 7.5*  HCT 24.4* 20.3* 25.5*  MCV 93.1 89.8 90.1  PLT 188 137* 152    Cardiac Enzymes: No results for input(s): CKTOTAL, CKMB, CKMBINDEX, TROPONINI in the last 168 hours.  BNP: Invalid input(s): POCBNP  CBG: Recent Labs   Lab 04/08/20 2029 04/09/20 0016 04/09/20 0446 04/09/20 0747 04/09/20 0851  GLUCAP 292* 348* 147* 73* 145*    Microbiology: Results for orders placed or performed during the hospital encounter of 04/08/20  Blood culture (routine x 2)     Status: None (Preliminary result)   Collection Time: 04/08/20  8:36 AM   Specimen: BLOOD  Result Value Ref Range Status   Specimen Description BLOOD BLOOD LEFT FOREARM  Final   Special Requests   Final    BOTTLES DRAWN AEROBIC AND ANAEROBIC Blood Culture results may not be optimal due to an inadequate volume of blood received in culture bottles   Culture   Final    NO GROWTH < 24 HOURS Performed at [facility], [address], [place],  [address]    Report Status PENDING  Incomplete  Blood culture (routine x 2)     Status: None (Preliminary result)   Collection Time: 04/08/20  8:45 AM   Specimen: BLOOD  Result Value Ref Range Status   Specimen Description BLOOD LEFT ANTECUBITAL  Final   Special Requests   Final    BOTTLES DRAWN AEROBIC AND ANAEROBIC Blood Culture results may not be optimal due to an excessive volume of blood received in culture bottles   Culture   Final    NO GROWTH <  24 HOURS Performed at [facility], [address] [place], [place] [address]    Report Status PENDING  Incomplete  Resp Panel by RT-PCR (Flu A&B, Covid) Nasopharyngeal Swab     Status: None   Collection Time: 04/08/20  8:45 AM   Specimen: Nasopharyngeal Swab; Nasopharyngeal(NP) swabs in vial transport medium  Result Value Ref Range Status   SARS Coronavirus 2 by RT PCR NEGATIVE NEGATIVE Final    Comment: (NOTE) SARS-CoV-2 target nucleic acids are NOT DETECTED.  The SARS-CoV-2 RNA is generally detectable in upper respiratory specimens during the acute phase of infection. The lowest concentration of SARS-CoV-2 viral copies this assay can detect is 138 copies/mL. A negative result does not preclude SARS-Cov-2 infection and  should not be used as the sole basis for treatment or other patient management decisions. A negative result may occur with  improper specimen collection/handling, submission of specimen other than nasopharyngeal swab, presence of viral mutation(s) within the areas targeted by this assay, and inadequate number of viral copies(<138 copies/mL). A negative result must be combined with clinical observations, patient history, and epidemiological information. The expected result is Negative.  Fact Sheet for Patients:  EntrepreneurPulse.com.au  Fact Sheet for Healthcare Providers:  IncredibleEmployment.be  This test is no t yet approved or cleared by the [place] FDA and  has been authorized for detection and/or diagnosis of SARS-CoV-2 by FDA under an Emergency Use Authorization (EUA). This EUA will remain  in effect (meaning this test can be used) for the duration of the COVID-19 declaration under Section 564(b)(1) of the Act, 21 U.S.C.section 360bbb-3(b)(1), unless the authorization is terminated  or revoked sooner.       Influenza A by PCR NEGATIVE NEGATIVE Final   Influenza B by PCR NEGATIVE NEGATIVE Final    Comment: (NOTE) The Xpert Xpress SARS-CoV-2/FLU/RSV plus assay is intended as an aid in the diagnosis of influenza from Nasopharyngeal swab specimens and should not be used as a sole basis for treatment. Nasal washings and aspirates are unacceptable for Xpert Xpress SARS-CoV-2/FLU/RSV testing.  Fact Sheet for Patients: EntrepreneurPulse.com.au  Fact Sheet for Healthcare Providers: IncredibleEmployment.be  This test is not yet approved or cleared by the [place] FDA and has been authorized for detection and/or diagnosis of SARS-CoV-2 by FDA under an Emergency Use Authorization (EUA). This EUA will remain in effect (meaning this test can be used) for the duration of the COVID-19 declaration  under Section 564(b)(1) of the Act, 21 U.S.C. section 360bbb-3(b)(1), unless the authorization is terminated or revoked.  Performed at [facility], [address] [place], [place] [address]     Coagulation Studies: No results for input(s): LABPROT, INR in the last 72 hours.  Urinalysis: No results for input(s): COLORURINE, LABSPEC, PHURINE, GLUCOSEU, HGBUR, BILIRUBINUR, KETONESUR, PROTEINUR, UROBILINOGEN, NITRITE, LEUKOCYTESUR in the last 72 hours.  Invalid input(s): APPERANCEUR    Imaging: DG Chest Portable 1 View  Result Date: 04/08/2020 CLINICAL DATA:  Missed dialysis EXAM: PORTABLE CHEST 1 VIEW COMPARISON:  01/09/2020 FINDINGS: Interstitial prominence and ill-defined increased density bilaterally. Probable trace left pleural effusion. Cardiomegaly. IMPRESSION: Cardiomegaly with findings suggestive of pulmonary edema. Electronically Signed   By: Macy Mis M.D.   On: 04/08/2020 08:20   CT Angio Abd/Pel w/ and/or w/o  Result Date: 04/08/2020 CLINICAL DATA:  60 year old patient with end-stage renal disease on hemodialysis. Evaluate for acute mesenteric ischemia. Altered mental status and patient was found to be hypoglycemic. EXAM: CTA ABDOMEN AND PELVIS WITHOUT AND WITH CONTRAST TECHNIQUE:  Multidetector CT imaging of the abdomen and pelvis was performed using the standard protocol during bolus administration of intravenous contrast. Multiplanar reconstructed images and MIPs were obtained and reviewed to evaluate the vascular anatomy. CONTRAST:  136mL OMNIPAQUE IOHEXOL 350 MG/ML SOLN COMPARISON:  CT abdomen pelvis 07/17/2019 FINDINGS: VASCULAR Aorta: Atherosclerotic calcifications involving the abdominal aorta without aneurysm or dissection. Celiac: Small amount of mixed plaque at the origin of the celiac trunk causing mild stenosis. Stenosis is 50% or less. Main branch vessels are patent. SMA: Small amount of calcified plaque at the origin of the SMA without significant  stenosis. No evidence for dissection or aneurysm. Incidentally, the patient has a replaced right hepatic artery originating from the proximal SMA. Renals: Single bilateral renal arteries are patent. Calcified plaque involving the origin and proximal aspect of the right renal artery causing mild-to-moderate stenosis but difficult to estimate the degree of stenosis. IMA: Patent. Inflow: Bilateral common, internal external iliac arteries have diffuse calcifications but patent. No significant stenosis involving the common or external iliac arteries. Proximal Outflow: Proximal femoral arteries are patent bilaterally. Veins: SMV, splenic vein and portal veins are patent on the delayed images. Limited evaluation of the IVC and iliac veins due to the timing of the study. Review of the MIP images confirms the above findings. NON-VASCULAR Lower chest: Small to moderate sized bilateral pleural effusions. Bilateral pleural fluid has increased since 07/17/2019. Patchy densities in both lower lobes associated with atelectasis. In addition, there is parenchymal disease in the right lower lobe which raises concern for pneumonia or aspiration. Heart is enlarged, particularly the right atrium. Hepatobiliary: Evidence for periportal edema without a discrete liver lesion. No acute abnormality to the gallbladder. No significant biliary dilatation. Pancreas: Again noted are extensive calcifications throughout the pancreas compatible with chronic pancreatic inflammation. Patient has developed a large low-density structure medial to the pancreatic head. This structure measures roughly 5.3 x 4.1 x 4.3 cm and best seen on sequence 6, image 86. There appears to be low-density material fluid tracking between this collection and another low-density collection in the left upper abdomen which is anterior to the spleen. The collection in the left upper abdomen measures 7.8 x 5.7 x 6.2 cm. Hounsfield units within these low-density collection are  water attenuation and there is no definite gas within the fluid collections. Spleen: Normal in size without focal abnormality. Adrenals/Urinary Tract: No gross abnormality to the adrenal glands. There is enhancement of both kidneys without hydronephrosis. Small amount of fluid in the urinary bladder. Small low-density structure in the right kidney lower pole is too small to definitively characterize but could represent a cyst. Stomach/Bowel: Patient has small rectal tube or probe. Large amount of stool in the rectum and sigmoid colon. Multiple fluid-filled loops of small bowel in the right abdomen measuring up to 2.2 cm in diameter. Air-fluid collection in the right lower quadrant may be associated the cecum but poorly characterized. No acute abnormality to the stomach. Difficult to evaluate the bowel structures due to the lack of oral contrast and the lack of intra-abdominal fat. Difficult to exclude mesenteric edema. Lymphatic: No significant lymph node enlargement in the abdomen or pelvis. Reproductive: Prostate is unremarkable. Other: No significant ascites.  Diffuse subcutaneous edema. Musculoskeletal: No acute bone abnormality. IMPRESSION: VASCULAR 1. Diffuse atherosclerotic disease in the abdomen and pelvis. Main mesenteric arteries are patent without significant stenosis. Mild stenosis involving the origin and proximal aspect of the celiac trunk. Findings are not suggestive for a chronic mesenteric ischemia pattern. 2.  Stenosis involving the right renal artery but difficult to accurately measure the degree of stenosis. 3.  Aortic Atherosclerosis (ICD10-I70.0). NON-VASCULAR 1. Two low-density collections in the upper abdomen. Largest collection is in left upper quadrant measuring up to 7.8 cm. The other fluid collection is medial to the pancreatic head and measures up to 5.3 cm. Findings are suggestive for pseudocyst collections. No evidence for gas within the pseudocysts. Diffuse pancreatic calcifications  are compatible with old pancreatic inflammation. 2. Small to moderate sized bilateral pleural effusions with compressive atelectasis in both lower lobes. Cannot exclude parenchymal disease with pneumonia or aspiration in the right lower lobe. 3. Cardiomegaly with enlargement of the right heart. Findings are suggestive for increased right heart pressures. 4. Limited evaluation of the bowel structures. Fluid-filled loops of small bowel in the right abdomen are nonspecific. Large amount of stool particularly in the distal colon. No evidence for bowel obstruction. Electronically Signed   By: Markus Daft M.D.   On: 04/08/2020 12:14     Medications:   . ferumoxytol     . aspirin EC  81 mg Oral Daily  . carvedilol  6.25 mg Oral BID WC  . Chlorhexidine Gluconate Cloth  6 each Topical Daily  . epoetin alfa  10,000 Units Intravenous Q T,Th,Sa-HD  . feeding supplement (NEPRO CARB STEADY)  237 mL Oral TID BM  . folic acid  1 mg Oral Daily  . hydrALAZINE  25 mg Oral BID  . insulin aspart  0-6 Units Subcutaneous TID WC  . iron polysaccharides  150 mg Oral Daily  . lidocaine-prilocaine  1 application Topical UD  . melatonin  5 mg Oral QHS  . mirtazapine  7.5 mg Oral QHS  . multivitamin  1 tablet Oral Daily  . sacubitril-valsartan  1 tablet Oral BID  . [START ON 04/10/2020] sertraline  150 mg Oral Daily   acetaminophen **OR** acetaminophen, ondansetron **OR** ondansetron (ZOFRAN) IV  Assessment/ Plan:  [name] is a 59 y.o. black male with end stage renal disease on hemodialysis, hypertension, diabetes mellitus type II, hyperlipidemia, history of alcohol abuse who was admitted on 04/08/2020 for Lactic acidosis [E87.2] Hypoglycemia [E16.2] Hypothermia, initial encounter [T68.XXXA] Acute metabolic encephalopathy [J49.70] Sepsis without acute organ dysfunction, due to unspecified organism ([place]) [A41.9]  [place]. Right AVF 59.5kg  1. End Stage Renal Disease: Seen and  examined on hemodialysis treatment. Tolerating treatment well.   2. Anemia with chronic kidney disease: hemoglobin 7.5  EPO with HD treatments.   Restarted on oral iron supplements.  4. Hypertension: blood pressure is soft. Home regimen of hydrlazine, Entresto and carvedilol continued.   5. Secondary Hyperparathyroidism: not currently on phosphate binders. On cinacalcet at home.    LOS: 1 Hriday Stai 11/27/202111:23 AM

## 2020-04-09 NOTE — Progress Notes (Addendum)
PROGRESS NOTE   [name]  NID:[id number] DOB: July 11, 1960 DOA: 04/08/2020 PCP: [name], MD  Brief Narrative:  59 year old black male ESRD since 04/2019 TTS [place] with right AV fistula baseline weight 59 kg  Brittle DM TY 2 with poor control and noncompliance documented on several hospital stays HTN CVA right-sided weakness press syndrome 06/2019 2/2 uncontrolled blood pressure-started on Keppra at that admission (subsequently discontinued) chronic bilateral ICA occlusion reflux severe pulmonary hypertension on 4 L of oxygen baseline plus mitral regurg chronic EtOH in 2020  Presented from home with toxic metabolic encephalopathy secondary to severe hypoglycemia in the setting of trying to treat high blood sugar 441-CBG on arrival 28-patient treated 2 A of D50-also hypothermic core temperature of 90 placed on Bair hugger lactic acidosis 6 therefore Rx sepsis-CXR = volume overload Given unclear etiology mesenteric CT obtained     Timeline Admission 8/28 01/13/2020 hypoxic respiratory failure with fluid overload transfusion admission Admission 8/12 through 01/04/2020 hypoxic respiratory failure secondary to probable aspiration in addition to volume overload-at that admission found to have quite labile CBGs Admission 7/25 through 05/17/4313 a metabolic encephalopathy cute respiratory failure, amputation left second toe 12/11/2019 Left [facility] [employer] discharge 09/17/2018 Admission 4/12 through 08/25/2019 altered mental status found down by family found to be hypoglycemic-some documentation of noncompliance with medications Admission 2/21 through 07/08/2019 with hyperosmolar hyperglycemic state placed on DKA protocol-at that admission felt to have chronic infarcts but press syndrome and had seizures Admission 121 from 06/09/2019 recurrent falls-heme positive stool hemoglobin low-went into [facility]-transfuse 1 unit, no GI work-up with performed given prior colonoscopy 02/2019  hemorrhoids polyp-aspirin discontinued at that time Admission 1/13 through 05/29/2019 falls weakness severe hypokalemia-falls showing rib fracture Admission 11/25 through 04/15/2019-came in with AKI and ultimately started dialysis after admission Admission 10/21 through 11/24 diarrhea-had colonoscopy with polyp removal Admission 3/13 through 4/00/8676 metabolic encephalopathy AKI   Assessment & Plan:   Principal Problem:   Acute metabolic encephalopathy Active Problems:   GERD (gastroesophageal reflux disease)   CVA (cerebral vascular accident) ([place])   ESRD on dialysis ([place])   DM (diabetes mellitus), type 2 with complications ([place])   Hypoglycemia due to insulin   Lactic acidosis   Anemia due to end stage renal disease ([place])   1. Toxic metabolic encephalopathy secondary to severe hypoglycemia a. Resolving-still somnolent when seen on HD but cannot tell me where he is etc. b. No further current work-up 2. Possible sepsis on admission--? Mesenteric ischemia on admission but not found on CT a. Received 1 dose of broad-spectrum IV antibiotics vancomycin on admission b. CT angiogram no ischemia lactic acidosis thought not to be secondary to sepsis more likely secondary to severe hypoglycemia and stress response c. Antibiotics discontinued and monitor trends 3. Brittle diabetes mellitus type 2 with complication of severe hypoglycemia on admission a. Discontinue D10 at 20 cc an hour as resolved b. This admission discontinued Lantus completely we will only place on very sensitive sliding scale-May resume 3 times daily 3 units insulin subsequently 4. Pancreatic pseudocysts 7.8 cm, 5.3 cm 5. Moderate constipation a. Chronic issues-we will start MiraLAX twice daily in addition to Senokot-S nightly to help with constipation b. He will need outpatient follow-up for his pseudocysts if they become symptomatic 6. Anemia with thrombocytopenia component of dilution a. Blood count of 6 was repeated but  found to be 7.5 therefore does not meet transfusion threshold and orders discontinued b. His TSAT is extremely low so I will give Feraheme instead  today 7. HTN with prior press syndrome 06/2019 a. Continue Coreg 6.25 twice daily meals, hydralazine 25 twice daily and Entresto 8. Depression a. Wife tells me he doesn't move around and stays in bed all day every day--doesn't play with his dog and sleep all day and sleep night b. Has been depressed since initiation of dialysis in addition to loss of his driver's license secondary to DWIs in the past several months c. Increase sertraline to 150 and outpatient follow-up with psychiatry and PCP for further management 9. Chronic respiratory failure on 2 to 4 L of oxygen--been using for this year a. Continue the same   Nonacute medical issues as below 10. Internal hemorrhoids and polypectomy 02/2019 11. Amputation left great toe 12/11/2019 12. Medical noncompliance 13. Prior ethanol use  DVT prophylaxis: Lovenox Code Status: Full Family Communication: talked to PHINEAS MCENROE [phone] Disposition:  Status is: Inpatient  Remains inpatient appropriate because:Persistent severe electrolyte disturbances, Ongoing diagnostic testing needed not appropriate for outpatient work up and Unsafe d/c plan   Dispo: The patient is from: Home              Anticipated d/c is to: Home              Anticipated d/c date is: 2 days              Patient currently is not medically stable to d/c.       Consultants:   Nephrology  Procedures:   Antimicrobials: Given one dose of cefepime and vancomycin   Subjective:  Awake coherent no distress Quite flat  Objective: Vitals:   04/08/20 1302 04/08/20 1611 04/08/20 2132 04/09/20 0449  BP: 136/67 (!) 126/57 118/75 106/73  Pulse: 86 95 87 87  Resp:  17  18  Temp: 97.6 F (36.4 C) 99.1 F (37.3 C)  98 F (36.7 C)  TempSrc: Axillary Oral  Oral  SpO2: 94% 100% 90% 91%  Weight:    61.4 kg  Height:         Intake/Output Summary (Last 24 hours) at 04/09/2020 4008 Last data filed at 04/08/2020 2132 Gross per 24 hour  Intake 1390.94 ml  Output 0 ml  Net 1390.94 ml   Filed Weights   04/08/20 0814 04/09/20 0449  Weight: 61 kg 61.4 kg    Examination:  eomi ncat no focal deficit quite flat affect cta b no added sound abd soft with smiling umbilicus s1 s2 no m/r/g Neuro intact no focal deficit  Data Reviewed: I have personally reviewed following labs and imaging studies Hemoglobin down from baseline 7.8-->6.1-->1 unit PRBC 11/27--> Platelet 137 White count 4.9 BUNs/creatinine 49/4.6 CO2 28 Iron level 26 saturation ratio is 9  Radiology Studies: DG Chest Portable 1 View  Result Date: 04/08/2020 CLINICAL DATA:  Missed dialysis EXAM: PORTABLE CHEST 1 VIEW COMPARISON:  01/09/2020 FINDINGS: Interstitial prominence and ill-defined increased density bilaterally. Probable trace left pleural effusion. Cardiomegaly. IMPRESSION: Cardiomegaly with findings suggestive of pulmonary edema. Electronically Signed   By: Macy Mis M.D.   On: 04/08/2020 08:20   CT Angio Abd/Pel w/ and/or w/o  Result Date: 04/08/2020 CLINICAL DATA:  59 year old patient with end-stage renal disease on hemodialysis. Evaluate for acute mesenteric ischemia. Altered mental status and patient was found to be hypoglycemic. EXAM: CTA ABDOMEN AND PELVIS WITHOUT AND WITH CONTRAST TECHNIQUE: Multidetector CT imaging of the abdomen and pelvis was performed using the standard protocol during bolus administration of intravenous contrast. Multiplanar reconstructed images and MIPs were obtained and  reviewed to evaluate the vascular anatomy. CONTRAST:  197mL OMNIPAQUE IOHEXOL 350 MG/ML SOLN COMPARISON:  CT abdomen pelvis 07/17/2019 FINDINGS: VASCULAR Aorta: Atherosclerotic calcifications involving the abdominal aorta without aneurysm or dissection. Celiac: Small amount of mixed plaque at the origin of the celiac trunk causing mild  stenosis. Stenosis is 50% or less. Main branch vessels are patent. SMA: Small amount of calcified plaque at the origin of the SMA without significant stenosis. No evidence for dissection or aneurysm. Incidentally, the patient has a replaced right hepatic artery originating from the proximal SMA. Renals: Single bilateral renal arteries are patent. Calcified plaque involving the origin and proximal aspect of the right renal artery causing mild-to-moderate stenosis but difficult to estimate the degree of stenosis. IMA: Patent. Inflow: Bilateral common, internal external iliac arteries have diffuse calcifications but patent. No significant stenosis involving the common or external iliac arteries. Proximal Outflow: Proximal femoral arteries are patent bilaterally. Veins: SMV, splenic vein and portal veins are patent on the delayed images. Limited evaluation of the IVC and iliac veins due to the timing of the study. Review of the MIP images confirms the above findings. NON-VASCULAR Lower chest: Small to moderate sized bilateral pleural effusions. Bilateral pleural fluid has increased since 07/17/2019. Patchy densities in both lower lobes associated with atelectasis. In addition, there is parenchymal disease in the right lower lobe which raises concern for pneumonia or aspiration. Heart is enlarged, particularly the right atrium. Hepatobiliary: Evidence for periportal edema without a discrete liver lesion. No acute abnormality to the gallbladder. No significant biliary dilatation. Pancreas: Again noted are extensive calcifications throughout the pancreas compatible with chronic pancreatic inflammation. Patient has developed a large low-density structure medial to the pancreatic head. This structure measures roughly 5.3 x 4.1 x 4.3 cm and best seen on sequence 6, image 86. There appears to be low-density material fluid tracking between this collection and another low-density collection in the left upper abdomen which is  anterior to the spleen. The collection in the left upper abdomen measures 7.8 x 5.7 x 6.2 cm. Hounsfield units within these low-density collection are water attenuation and there is no definite gas within the fluid collections. Spleen: Normal in size without focal abnormality. Adrenals/Urinary Tract: No gross abnormality to the adrenal glands. There is enhancement of both kidneys without hydronephrosis. Small amount of fluid in the urinary bladder. Small low-density structure in the right kidney lower pole is too small to definitively characterize but could represent a cyst. Stomach/Bowel: Patient has small rectal tube or probe. Large amount of stool in the rectum and sigmoid colon. Multiple fluid-filled loops of small bowel in the right abdomen measuring up to 2.2 cm in diameter. Air-fluid collection in the right lower quadrant may be associated the cecum but poorly characterized. No acute abnormality to the stomach. Difficult to evaluate the bowel structures due to the lack of oral contrast and the lack of intra-abdominal fat. Difficult to exclude mesenteric edema. Lymphatic: No significant lymph node enlargement in the abdomen or pelvis. Reproductive: Prostate is unremarkable. Other: No significant ascites.  Diffuse subcutaneous edema. Musculoskeletal: No acute bone abnormality. IMPRESSION: VASCULAR 1. Diffuse atherosclerotic disease in the abdomen and pelvis. Main mesenteric arteries are patent without significant stenosis. Mild stenosis involving the origin and proximal aspect of the celiac trunk. Findings are not suggestive for a chronic mesenteric ischemia pattern. 2. Stenosis involving the right renal artery but difficult to accurately measure the degree of stenosis. 3.  Aortic Atherosclerosis (ICD10-I70.0). NON-VASCULAR 1. Two low-density collections in the upper  abdomen. Largest collection is in left upper quadrant measuring up to 7.8 cm. The other fluid collection is medial to the pancreatic head and  measures up to 5.3 cm. Findings are suggestive for pseudocyst collections. No evidence for gas within the pseudocysts. Diffuse pancreatic calcifications are compatible with old pancreatic inflammation. 2. Small to moderate sized bilateral pleural effusions with compressive atelectasis in both lower lobes. Cannot exclude parenchymal disease with pneumonia or aspiration in the right lower lobe. 3. Cardiomegaly with enlargement of the right heart. Findings are suggestive for increased right heart pressures. 4. Limited evaluation of the bowel structures. Fluid-filled loops of small bowel in the right abdomen are nonspecific. Large amount of stool particularly in the distal colon. No evidence for bowel obstruction. Electronically Signed   By: Markus Daft M.D.   On: 04/08/2020 12:14     Scheduled Meds: . sodium chloride   Intravenous Once  . sodium chloride   Intravenous Once  . aspirin EC  81 mg Oral Daily  . carvedilol  6.25 mg Oral BID WC  . Chlorhexidine Gluconate Cloth  6 each Topical Daily  . epoetin alfa  10,000 Units Intravenous Q T,Th,Sa-HD  . feeding supplement (NEPRO CARB STEADY)  237 mL Oral TID BM  . folic acid  1 mg Oral Daily  . hydrALAZINE  25 mg Oral BID  . iron polysaccharides  150 mg Oral Daily  . lidocaine-prilocaine  1 application Topical UD  . melatonin  5 mg Oral QHS  . mirtazapine  7.5 mg Oral QHS  . multivitamin  1 tablet Oral Daily  . sacubitril-valsartan  1 tablet Oral BID  . sertraline  100 mg Oral Daily   Continuous Infusions: . dextrose Stopped (04/08/20 1316)     LOS: 1 day    Time spent: [place], MD [facility] To contact the attending provider between 7A-7P or the covering provider during after hours 7P-7A, please log into the web site [email or web address] and access using universal [place] password for that web site. If you do not have the password, please call the hospital operator.  04/09/2020, 7:38 AM

## 2020-04-09 NOTE — Plan of Care (Signed)

## 2020-04-10 DIAGNOSIS — G9341 Metabolic encephalopathy: Secondary | ICD-10-CM | POA: Diagnosis not present

## 2020-04-10 LAB — GLUCOSE, CAPILLARY
Glucose-Capillary: 131 mg/dL — ABNORMAL HIGH (ref 70–99)
Glucose-Capillary: 158 mg/dL — ABNORMAL HIGH (ref 70–99)
Glucose-Capillary: 178 mg/dL — ABNORMAL HIGH (ref 70–99)
Glucose-Capillary: 208 mg/dL — ABNORMAL HIGH (ref 70–99)
Glucose-Capillary: 239 mg/dL — ABNORMAL HIGH (ref 70–99)
Glucose-Capillary: 287 mg/dL — ABNORMAL HIGH (ref 70–99)
Glucose-Capillary: 303 mg/dL — ABNORMAL HIGH (ref 70–99)
Glucose-Capillary: 45 mg/dL — ABNORMAL LOW (ref 70–99)
Glucose-Capillary: 96 mg/dL (ref 70–99)

## 2020-04-10 LAB — URINALYSIS, COMPLETE (UACMP) WITH MICROSCOPIC
Bacteria, UA: NONE SEEN
Bilirubin Urine: NEGATIVE
Glucose, UA: >=500 mg/dL — AB
Hgb urine dipstick: NEGATIVE
Ketones, ur: NEGATIVE mg/dL
Leukocytes,Ua: NEGATIVE
Nitrite: NEGATIVE
Protein, ur: >=300 mg/dL — AB
Specific Gravity, Urine: 1.017 (ref 1.005–1.030)
pH: 7 (ref 5.0–8.0)

## 2020-04-10 LAB — HEMOGLOBIN A1C
Hgb A1c MFr Bld: 6.5 % — ABNORMAL HIGH (ref 4.8–5.6)
Mean Plasma Glucose: 139.85 mg/dL

## 2020-04-10 LAB — OCCULT BLOOD X 1 CARD TO LAB, STOOL: Fecal Occult Bld: NEGATIVE

## 2020-04-10 MED ORDER — INSULIN ASPART 100 UNIT/ML ~~LOC~~ SOLN
2.0000 [IU] | Freq: Three times a day (TID) | SUBCUTANEOUS | Status: DC
  Administered 2020-04-10 – 2020-04-11 (×3): 2 [IU] via SUBCUTANEOUS
  Filled 2020-04-10 (×3): qty 1

## 2020-04-10 MED ORDER — LOPERAMIDE HCL 2 MG PO CAPS
2.0000 mg | ORAL_CAPSULE | Freq: Once | ORAL | Status: AC
  Administered 2020-04-10: 2 mg via ORAL
  Filled 2020-04-10: qty 1

## 2020-04-10 NOTE — Hospital Course (Signed)
Timeline Admission 8/28 01/13/2020 hypoxic respiratory failure with fluid overload transfusion admission Admission 8/12 through 01/04/2020 hypoxic respiratory failure secondary to probable aspiration in addition to volume overload-at that admission found to have quite labile CBGs Admission 7/25 through 05/22/4172 a metabolic encephalopathy cute respiratory failure, amputation left second toe 12/11/2019 Left [place] discharge 09/17/2018 Admission 4/12 through 08/25/2019 altered mental status found down by family found to be hypoglycemic-some documentation of noncompliance with medications Admission 2/21 through 07/08/2019 with hyperosmolar hyperglycemic state placed on DKA protocol-at that admission felt to have chronic infarcts but press syndrome and had seizures Admission 121 from 06/09/2019 recurrent falls-heme positive stool hemoglobin low-went into [facility]-transfuse 1 unit, no GI work-up with performed given prior colonoscopy 02/2019 hemorrhoids polyp-aspirin discontinued at that time Admission 1/13 through 05/29/2019 falls weakness severe hypokalemia-falls showing rib fracture Admission 11/25 through 04/15/2019-came in with AKI and ultimately started dialysis after admission Admission 10/21 through 11/24 diarrhea-had colonoscopy with polyp removal Admission 3/13 through 0/81/4481 metabolic encephalopathy AKI

## 2020-04-10 NOTE — Plan of Care (Signed)
  Problem: Clinical Measurements: Goal: Will remain free from infection Outcome: Progressing   Problem: Safety: Goal: Ability to remain free from injury will improve Outcome: Progressing   Problem: Education: Goal: Knowledge of General Education information will improve Description: Including pain rating scale, medication(s)/side effects and non-pharmacologic comfort measures Outcome: Progressing

## 2020-04-10 NOTE — Progress Notes (Signed)
[facility]  ROUNDING NOTE   Subjective:   Hemodialysis treatment yesterday. Tolerated treatment well. UF of 1 liter.  Patient examined this morning while he was eating breakfast. He denies any complaints.   Objective:  Vital signs in last 24 hours:  Temp:  [97.4 F (36.3 C)-98 F (36.7 C)] 97.4 F (36.3 C) (11/28 0815) Pulse Rate:  [71-92] 77 (11/28 0815) Resp:  [14-22] 18 (11/28 0815) BP: (106-153)/(55-78) 128/72 (11/28 0815) SpO2:  [90 %-99 %] 98 % (11/28 0815) Weight:  [60.9 kg] 60.9 kg (11/28 0406)  Weight change: -0.082 kg Filed Weights   04/08/20 0814 04/09/20 0449 04/10/20 0406  Weight: 61 kg 61.4 kg 60.9 kg    Intake/Output: I/O last 3 completed shifts: In: 1120 [P.O.:1070; IV Piggyback:50] Out: 1077 [Urine:76; Other:1000; Stool:1]   Intake/Output this shift:  No intake/output data recorded.  Physical Exam: General: Laying in bed  Head: Normocephalic, atraumatic. Moist oral mucosal membranes  Eyes: Anicteric, PERRL  Neck: Supple, trachea midline  Lungs:  Clear to auscultation  Heart: Regular rate and rhythm  Abdomen:  Soft, nontender,   Extremities:  no peripheral edema.  Neurologic: Oriented  Skin: No lesions  Access: Right AVF    Basic Metabolic Panel: Recent Labs  Lab 04/08/20 0758 04/09/20 0520  NA 138 138  K 4.8 4.9  CL 99 101  CO2 25 28  GLUCOSE 328* 112*  BUN 35* 49*  CREATININE 4.01* 4.69*  CALCIUM 8.3* 8.2*  MG 2.7*  --     Liver Function Tests: Recent Labs  Lab 04/08/20 0758  AST 44*  ALT 39  ALKPHOS 221*  BILITOT 0.7  PROT 6.6  ALBUMIN 2.8*   No results for input(s): LIPASE, AMYLASE in the last 168 hours. No results for input(s): AMMONIA in the last 168 hours.  CBC: Recent Labs  Lab 04/08/20 0758 04/09/20 0520 04/09/20 0855  WBC 8.3 4.9 5.6  NEUTROABS 6.5  --  3.9  HGB 7.0* 6.1* 7.5*  HCT 24.4* 20.3* 25.5*  MCV 93.1 89.8 90.1  PLT 188 137* 152    Cardiac Enzymes: No results for input(s):  CKTOTAL, CKMB, CKMBINDEX, TROPONINI in the last 168 hours.  BNP: Invalid input(s): POCBNP  CBG: Recent Labs  Lab 04/10/20 0039 04/10/20 0404 04/10/20 0439 04/10/20 0617 04/10/20 0812  GLUCAP 239* 41* 96 158* 131*    Microbiology: Results for orders placed or performed during the hospital encounter of 04/08/20  Blood culture (routine x 2)     Status: None (Preliminary result)   Collection Time: 04/08/20  8:36 AM   Specimen: BLOOD  Result Value Ref Range Status   Specimen Description BLOOD BLOOD LEFT FOREARM  Final   Special Requests   Final    BOTTLES DRAWN AEROBIC AND ANAEROBIC Blood Culture results may not be optimal due to an inadequate volume of blood received in culture bottles   Culture   Final    NO GROWTH 2 DAYS Performed at [facility], [address], [place], [place] [address]    Report Status PENDING  Incomplete  Blood culture (routine x 2)     Status: None (Preliminary result)   Collection Time: 04/08/20  8:45 AM   Specimen: BLOOD  Result Value Ref Range Status   Specimen Description BLOOD LEFT ANTECUBITAL  Final   Special Requests   Final    BOTTLES DRAWN AEROBIC AND ANAEROBIC Blood Culture results may not be optimal due to an excessive volume of blood received in culture  bottles   Culture   Final    NO GROWTH 2 DAYS Performed at [facility], [address] [place], [place] [address]    Report Status PENDING  Incomplete  Resp Panel by RT-PCR (Flu A&B, Covid) Nasopharyngeal Swab     Status: None   Collection Time: 04/08/20  8:45 AM   Specimen: Nasopharyngeal Swab; Nasopharyngeal(NP) swabs in vial transport medium  Result Value Ref Range Status   SARS Coronavirus 2 by RT PCR NEGATIVE NEGATIVE Final    Comment: (NOTE) SARS-CoV-2 target nucleic acids are NOT DETECTED.  The SARS-CoV-2 RNA is generally detectable in upper respiratory specimens during the acute phase of infection. The lowest concentration of SARS-CoV-2 viral copies this  assay can detect is 138 copies/mL. A negative result does not preclude SARS-Cov-2 infection and should not be used as the sole basis for treatment or other patient management decisions. A negative result may occur with  improper specimen collection/handling, submission of specimen other than nasopharyngeal swab, presence of viral mutation(s) within the areas targeted by this assay, and inadequate number of viral copies(<138 copies/mL). A negative result must be combined with clinical observations, patient history, and epidemiological information. The expected result is Negative.  Fact Sheet for Patients:  EntrepreneurPulse.com.au  Fact Sheet for Healthcare Providers:  IncredibleEmployment.be  This test is no t yet approved or cleared by the [place] FDA and  has been authorized for detection and/or diagnosis of SARS-CoV-2 by FDA under an Emergency Use Authorization (EUA). This EUA will remain  in effect (meaning this test can be used) for the duration of the COVID-19 declaration under Section 564(b)(1) of the Act, 21 U.S.C.section 360bbb-3(b)(1), unless the authorization is terminated  or revoked sooner.       Influenza A by PCR NEGATIVE NEGATIVE Final   Influenza B by PCR NEGATIVE NEGATIVE Final    Comment: (NOTE) The Xpert Xpress SARS-CoV-2/FLU/RSV plus assay is intended as an aid in the diagnosis of influenza from Nasopharyngeal swab specimens and should not be used as a sole basis for treatment. Nasal washings and aspirates are unacceptable for Xpert Xpress SARS-CoV-2/FLU/RSV testing.  Fact Sheet for Patients: EntrepreneurPulse.com.au  Fact Sheet for Healthcare Providers: IncredibleEmployment.be  This test is not yet approved or cleared by the [place] FDA and has been authorized for detection and/or diagnosis of SARS-CoV-2 by FDA under an Emergency Use Authorization (EUA). This EUA will  remain in effect (meaning this test can be used) for the duration of the COVID-19 declaration under Section 564(b)(1) of the Act, 21 U.S.C. section 360bbb-3(b)(1), unless the authorization is terminated or revoked.  Performed at [facility], [address] [place], [place] [address]     Coagulation Studies: No results for input(s): LABPROT, INR in the last 72 hours.  Urinalysis: Recent Labs    04/10/20 0330  COLORURINE YELLOW*  LABSPEC 1.017  PHURINE 7.0  GLUCOSEU >=500*  HGBUR NEGATIVE  BILIRUBINUR NEGATIVE  KETONESUR NEGATIVE  PROTEINUR >=300*  NITRITE NEGATIVE  LEUKOCYTESUR NEGATIVE      Imaging: No results found.   Medications:    . aspirin EC  81 mg Oral Daily  . carvedilol  6.25 mg Oral BID WC  . Chlorhexidine Gluconate Cloth  6 each Topical Daily  . epoetin alfa  10,000 Units Intravenous Q T,Th,Sa-HD  . feeding supplement (NEPRO CARB STEADY)  237 mL Oral TID BM  . folic acid  1 mg Oral Daily  . hydrALAZINE  25 mg Oral BID  . insulin  aspart  2 Units Subcutaneous TID WC  . iron polysaccharides  150 mg Oral Daily  . lidocaine-prilocaine  1 application Topical UD  . melatonin  5 mg Oral QHS  . mirtazapine  7.5 mg Oral QHS  . multivitamin  1 tablet Oral Daily  . sacubitril-valsartan  1 tablet Oral BID  . sertraline  150 mg Oral Daily   acetaminophen **OR** acetaminophen, ondansetron **OR** ondansetron (ZOFRAN) IV  Assessment/ Plan:  [name] is a 59 y.o. black male with end stage renal disease on hemodialysis, hypertension, diabetes mellitus type II, hyperlipidemia, history of alcohol abuse who was admitted on 04/08/2020 for Lactic acidosis [E87.2] Hypoglycemia [E16.2] Hypothermia, initial encounter [T68.XXXA] Acute metabolic encephalopathy [Y37.09] Sepsis without acute organ dysfunction, due to unspecified organism ([place]) [A41.9]  [place]. Right AVF 59.5kg  1. End Stage Renal Disease: Continue TTS schedule.    2. Anemia with chronic kidney disease: hemoglobin 7.5  EPO with HD treatments.   Restarted on oral iron supplements.  4. Hypertension: blood pressure is soft. Home regimen of hydralazine, Entresto and carvedilol continued.   5. Secondary Hyperparathyroidism: not currently on phosphate binders. On cinacalcet at home.    LOS: 2 [name] 11/28/202111:45 AM

## 2020-04-10 NOTE — Progress Notes (Signed)
PROGRESS NOTE   [name]  GNF:[id number] DOB: 1960/07/09 DOA: 04/08/2020 PCP: [name], MD  Brief Narrative:  59 year old black male ESRD since 04/2019 TTS [place] with right AV fistula baseline weight 59 kg  Brittle DM TY 2 with poor control and noncompliance documented on several hospital stays HTN CVA right-sided weakness press syndrome 06/2019 2/2 uncontrolled blood pressure-started on Keppra at that admission (subsequently discontinued) chronic bilateral ICA occlusion reflux severe pulmonary hypertension on 4 L of oxygen baseline plus mitral regurg chronic EtOH in 2020  Presented from home with toxic metabolic encephalopathy secondary to severe hypoglycemia in the setting of trying to treat high blood sugar 441-CBG on arrival 28-patient treated 2 A of D50-also hypothermic core temperature of 90 placed on Bair hugger lactic acidosis 6 therefore Rx sepsis-CXR = volume overload Given unclear etiology mesenteric CT obtained which was negative    Assessment & Plan:   Principal Problem:   Acute metabolic encephalopathy Active Problems:   GERD (gastroesophageal reflux disease)   CVA (cerebral vascular accident) ([place])   ESRD on dialysis ([place])   DM (diabetes mellitus), type 2 with complications ([place])   Hypoglycemia due to insulin   Lactic acidosis   Anemia due to end stage renal disease ([place])   1. Toxic metabolic encephalopathy secondary to severe hypoglycemia a. Much improved today-able to tell me date time year and more spontaneous in his speech b. No further work-up 2. Brittle diabetes mellitus type 2 with very widely varying blood sugars a. Sugars as high as 410 as low as 40--this seems to occur to some degree at home b. He is on very sensitive sliding scale coverage-was given 4 units last night for coverage of a blood sugar of 400 and then dropped to the 40s this morning c. Would prefer that blood sugars managed with no insulin for CBGs above 200 I have  adjusted his insulin dosing 3. Possible sepsis on admission--? Mesenteric ischemia on admission but not found on CT a. Received 1 dose of broad-spectrum IV antibiotics vancomycin on admission b. CT angiogram  [-] no further work-up no antibiotics 4. Pancreatic pseudocysts 7.8 cm, 5.3 cm 5. Moderate constipation a. Chronic--MiraLAX, senna stopped and given a dose of loperamide for diarrhea b. He will need outpatient follow-up for his pseudocysts if they become symptomatic 6. Anemia with thrombocytopenia component of dilution a. Spuriously blood count of 6 so no need for transfusion b. His TSAT low this admission-Feraheme given 1127 7. HTN with prior press syndrome 06/2019 a. Continue Coreg 6.25 twice daily meals, hydralazine 25 twice daily and Entresto 8. Depression a. Wife tells me he doesn't move around and stays in bed all day every day--doesn't play with his dog and sleep all day and sleep night b. Has been depressed since initiation of dialysis in addition to loss of his driver's license secondary to DWIs in the past several months c. Increase sertraline to 150 and outpatient follow-up with psychiatry and PCP for further management 9. Chronic respiratory failure on 2 to 4 L of oxygen--been using for this year a. Continue the same   Nonacute medical issues as below 10. Internal hemorrhoids and polypectomy 02/2019 11. Amputation left great toe 12/11/2019 12. Medical noncompliance 13. Prior ethanol use  DVT prophylaxis: Lovenox Code Status: Full Family Communication: talked to TREJAN BUDA [phone] on 11/27 Disposition:  Status is: Inpatient  Remains inpatient appropriate because:Persistent severe electrolyte disturbances, Ongoing diagnostic testing needed not appropriate for outpatient work up and Unsafe d/c plan  Dispo: The patient is from: Home              Anticipated d/c is to: Home              Anticipated d/c date is: 2 days              Patient currently is not medically  stable to d/c.       Consultants:   Nephrology  Procedures:   Antimicrobials: Given one dose of cefepime and vancomycin   Subjective:  More coherent today more interactive Try to sit up out of the bed but was quite weak and unsteady with postural positioning No chest pain no fever Eating and drinking some No nausea no vomiting    Objective: Vitals:   04/09/20 1433 04/09/20 1935 04/10/20 0406 04/10/20 0815  BP: (!) 153/77 (!) 127/55 131/78 128/72  Pulse: 80 88 76 77  Resp: 14 18  18   Temp: (!) 97.4 F (36.3 C) 97.8 F (36.6 C) 98 F (36.7 C) (!) 97.4 F (36.3 C)  TempSrc: Oral Oral Oral Oral  SpO2: 92% 96% 99% 98%  Weight:   60.9 kg   Height:        Intake/Output Summary (Last 24 hours) at 04/10/2020 1740 Last data filed at 04/10/2020 0407 Gross per 24 hour  Intake 523 ml  Output 1077 ml  Net -554 ml   Filed Weights   04/08/20 0814 04/09/20 0449 04/10/20 0406  Weight: 61 kg 61.4 kg 60.9 kg    Examination:  Flat affect more spontaneous speech no distress Chest clear no rales rhonchi abd soft with smiling umbilicus s1 s2 no m/r/g Neuro intact no focal deficit  Data Reviewed: I have personally reviewed following labs and imaging studies Hemoglobin down from baseline 7.8--> 6.1--7.5 Platelet 152 White count 5.6 BUNs/creatinine 49/4.6   Radiology Studies: CT Angio Abd/Pel w/ and/or w/o  Result Date: 04/08/2020 CLINICAL DATA:  59 year old patient with end-stage renal disease on hemodialysis. Evaluate for acute mesenteric ischemia. Altered mental status and patient was found to be hypoglycemic. EXAM: CTA ABDOMEN AND PELVIS WITHOUT AND WITH CONTRAST TECHNIQUE: Multidetector CT imaging of the abdomen and pelvis was performed using the standard protocol during bolus administration of intravenous contrast. Multiplanar reconstructed images and MIPs were obtained and reviewed to evaluate the vascular anatomy. CONTRAST:  175mL OMNIPAQUE IOHEXOL 350 MG/ML SOLN  COMPARISON:  CT abdomen pelvis 07/17/2019 FINDINGS: VASCULAR Aorta: Atherosclerotic calcifications involving the abdominal aorta without aneurysm or dissection. Celiac: Small amount of mixed plaque at the origin of the celiac trunk causing mild stenosis. Stenosis is 50% or less. Main branch vessels are patent. SMA: Small amount of calcified plaque at the origin of the SMA without significant stenosis. No evidence for dissection or aneurysm. Incidentally, the patient has a replaced right hepatic artery originating from the proximal SMA. Renals: Single bilateral renal arteries are patent. Calcified plaque involving the origin and proximal aspect of the right renal artery causing mild-to-moderate stenosis but difficult to estimate the degree of stenosis. IMA: Patent. Inflow: Bilateral common, internal external iliac arteries have diffuse calcifications but patent. No significant stenosis involving the common or external iliac arteries. Proximal Outflow: Proximal femoral arteries are patent bilaterally. Veins: SMV, splenic vein and portal veins are patent on the delayed images. Limited evaluation of the IVC and iliac veins due to the timing of the study. Review of the MIP images confirms the above findings. NON-VASCULAR Lower chest: Small to moderate sized bilateral pleural effusions. Bilateral pleural  fluid has increased since 07/17/2019. Patchy densities in both lower lobes associated with atelectasis. In addition, there is parenchymal disease in the right lower lobe which raises concern for pneumonia or aspiration. Heart is enlarged, particularly the right atrium. Hepatobiliary: Evidence for periportal edema without a discrete liver lesion. No acute abnormality to the gallbladder. No significant biliary dilatation. Pancreas: Again noted are extensive calcifications throughout the pancreas compatible with chronic pancreatic inflammation. Patient has developed a large low-density structure medial to the pancreatic  head. This structure measures roughly 5.3 x 4.1 x 4.3 cm and best seen on sequence 6, image 86. There appears to be low-density material fluid tracking between this collection and another low-density collection in the left upper abdomen which is anterior to the spleen. The collection in the left upper abdomen measures 7.8 x 5.7 x 6.2 cm. Hounsfield units within these low-density collection are water attenuation and there is no definite gas within the fluid collections. Spleen: Normal in size without focal abnormality. Adrenals/Urinary Tract: No gross abnormality to the adrenal glands. There is enhancement of both kidneys without hydronephrosis. Small amount of fluid in the urinary bladder. Small low-density structure in the right kidney lower pole is too small to definitively characterize but could represent a cyst. Stomach/Bowel: Patient has small rectal tube or probe. Large amount of stool in the rectum and sigmoid colon. Multiple fluid-filled loops of small bowel in the right abdomen measuring up to 2.2 cm in diameter. Air-fluid collection in the right lower quadrant may be associated the cecum but poorly characterized. No acute abnormality to the stomach. Difficult to evaluate the bowel structures due to the lack of oral contrast and the lack of intra-abdominal fat. Difficult to exclude mesenteric edema. Lymphatic: No significant lymph node enlargement in the abdomen or pelvis. Reproductive: Prostate is unremarkable. Other: No significant ascites.  Diffuse subcutaneous edema. Musculoskeletal: No acute bone abnormality. IMPRESSION: VASCULAR 1. Diffuse atherosclerotic disease in the abdomen and pelvis. Main mesenteric arteries are patent without significant stenosis. Mild stenosis involving the origin and proximal aspect of the celiac trunk. Findings are not suggestive for a chronic mesenteric ischemia pattern. 2. Stenosis involving the right renal artery but difficult to accurately measure the degree of stenosis.  3.  Aortic Atherosclerosis (ICD10-I70.0). NON-VASCULAR 1. Two low-density collections in the upper abdomen. Largest collection is in left upper quadrant measuring up to 7.8 cm. The other fluid collection is medial to the pancreatic head and measures up to 5.3 cm. Findings are suggestive for pseudocyst collections. No evidence for gas within the pseudocysts. Diffuse pancreatic calcifications are compatible with old pancreatic inflammation. 2. Small to moderate sized bilateral pleural effusions with compressive atelectasis in both lower lobes. Cannot exclude parenchymal disease with pneumonia or aspiration in the right lower lobe. 3. Cardiomegaly with enlargement of the right heart. Findings are suggestive for increased right heart pressures. 4. Limited evaluation of the bowel structures. Fluid-filled loops of small bowel in the right abdomen are nonspecific. Large amount of stool particularly in the distal colon. No evidence for bowel obstruction. Electronically Signed   By: Markus Daft M.D.   On: 04/08/2020 12:14     Scheduled Meds: . aspirin EC  81 mg Oral Daily  . carvedilol  6.25 mg Oral BID WC  . Chlorhexidine Gluconate Cloth  6 each Topical Daily  . epoetin alfa  10,000 Units Intravenous Q T,Th,Sa-HD  . feeding supplement (NEPRO CARB STEADY)  237 mL Oral TID BM  . folic acid  1 mg Oral Daily  .  hydrALAZINE  25 mg Oral BID  . insulin aspart  0-6 Units Subcutaneous TID WC  . iron polysaccharides  150 mg Oral Daily  . lidocaine-prilocaine  1 application Topical UD  . melatonin  5 mg Oral QHS  . mirtazapine  7.5 mg Oral QHS  . multivitamin  1 tablet Oral Daily  . sacubitril-valsartan  1 tablet Oral BID  . sertraline  150 mg Oral Daily   Continuous Infusions:    LOS: 2 days    Time spent: [place], MD [facility] To contact the attending provider between 7A-7P or the covering provider during after hours 7P-7A, please log into the web site [email or web address] and access  using universal [place] password for that web site. If you do not have the password, please call the hospital operator.  04/10/2020, 9:18 AM

## 2020-04-11 DIAGNOSIS — G9341 Metabolic encephalopathy: Secondary | ICD-10-CM | POA: Diagnosis not present

## 2020-04-11 LAB — TYPE AND SCREEN
ABO/RH(D): A POS
Antibody Screen: NEGATIVE
Unit division: 0
Unit division: 0
Unit division: 0

## 2020-04-11 LAB — BPAM RBC
Blood Product Expiration Date: 202112132359
Blood Product Expiration Date: 202112162359
Blood Product Expiration Date: 202112192359
Unit Type and Rh: 6200
Unit Type and Rh: 6200
Unit Type and Rh: 6200

## 2020-04-11 LAB — CBC WITH DIFFERENTIAL/PLATELET
Abs Immature Granulocytes: 0.01 10*3/uL (ref 0.00–0.07)
Basophils Absolute: 0 10*3/uL (ref 0.0–0.1)
Basophils Relative: 0 %
Eosinophils Absolute: 0.1 10*3/uL (ref 0.0–0.5)
Eosinophils Relative: 1 %
HCT: 24.2 % — ABNORMAL LOW (ref 39.0–52.0)
Hemoglobin: 7.3 g/dL — ABNORMAL LOW (ref 13.0–17.0)
Immature Granulocytes: 0 %
Lymphocytes Relative: 15 %
Lymphs Abs: 0.9 10*3/uL (ref 0.7–4.0)
MCH: 27.1 pg (ref 26.0–34.0)
MCHC: 30.2 g/dL (ref 30.0–36.0)
MCV: 90 fL (ref 80.0–100.0)
Monocytes Absolute: 0.5 10*3/uL (ref 0.1–1.0)
Monocytes Relative: 7 %
Neutro Abs: 4.9 10*3/uL (ref 1.7–7.7)
Neutrophils Relative %: 77 %
Platelets: 154 10*3/uL (ref 150–400)
RBC: 2.69 MIL/uL — ABNORMAL LOW (ref 4.22–5.81)
RDW: 16.6 % — ABNORMAL HIGH (ref 11.5–15.5)
WBC: 6.3 10*3/uL (ref 4.0–10.5)
nRBC: 0 % (ref 0.0–0.2)

## 2020-04-11 LAB — BASIC METABOLIC PANEL
Anion gap: 12 (ref 5–15)
BUN: 39 mg/dL — ABNORMAL HIGH (ref 6–20)
CO2: 24 mmol/L (ref 22–32)
Calcium: 8.3 mg/dL — ABNORMAL LOW (ref 8.9–10.3)
Chloride: 102 mmol/L (ref 98–111)
Creatinine, Ser: 4.84 mg/dL — ABNORMAL HIGH (ref 0.61–1.24)
GFR, Estimated: 13 mL/min — ABNORMAL LOW (ref >60)
Glucose, Bld: 310 mg/dL — ABNORMAL HIGH (ref 70–99)
Potassium: 4.9 mmol/L (ref 3.5–5.1)
Sodium: 138 mmol/L (ref 135–145)

## 2020-04-11 LAB — GLUCOSE, CAPILLARY
Glucose-Capillary: 197 mg/dL — ABNORMAL HIGH (ref 70–99)
Glucose-Capillary: 206 mg/dL — ABNORMAL HIGH (ref 70–99)
Glucose-Capillary: 282 mg/dL — ABNORMAL HIGH (ref 70–99)
Glucose-Capillary: 327 mg/dL — ABNORMAL HIGH (ref 70–99)
Glucose-Capillary: 340 mg/dL — ABNORMAL HIGH (ref 70–99)
Glucose-Capillary: 349 mg/dL — ABNORMAL HIGH (ref 70–99)
Glucose-Capillary: 373 mg/dL — ABNORMAL HIGH (ref 70–99)

## 2020-04-11 LAB — PREPARE RBC (CROSSMATCH)

## 2020-04-11 LAB — SARS CORONAVIRUS 2 BY RT PCR (HOSPITAL ORDER, PERFORMED IN ~~LOC~~ HOSPITAL LAB): SARS Coronavirus 2: NEGATIVE

## 2020-04-11 LAB — HEPATITIS B SURFACE ANTIBODY, QUANTITATIVE: Hep B S AB Quant (Post): 4 m[IU]/mL — ABNORMAL LOW (ref >9.9)

## 2020-04-11 MED ORDER — LANTUS SOLOSTAR 100 UNIT/ML ~~LOC~~ SOPN
4.0000 [IU] | PEN_INJECTOR | Freq: Every day | SUBCUTANEOUS | 11 refills | Status: DC
Start: 2020-04-11 — End: 2020-04-22

## 2020-04-11 MED ORDER — INSULIN ASPART 100 UNIT/ML ~~LOC~~ SOLN
0.0000 [IU] | Freq: Three times a day (TID) | SUBCUTANEOUS | Status: DC
  Administered 2020-04-11: 5 [IU] via SUBCUTANEOUS
  Filled 2020-04-11: qty 1

## 2020-04-11 MED ORDER — INSULIN ASPART 100 UNIT/ML ~~LOC~~ SOLN: 2.0000 [IU] | Freq: Three times a day (TID) | SUBCUTANEOUS | Status: DC

## 2020-04-11 MED ORDER — INSULIN ASPART 100 UNIT/ML ~~LOC~~ SOLN: 0.0000 [IU] | Freq: Three times a day (TID) | SUBCUTANEOUS | Status: DC

## 2020-04-11 MED ORDER — SERTRALINE HCL 100 MG PO TABS: 150.0000 mg | ORAL_TABLET | Freq: Every day | ORAL | 0 refills | Status: AC

## 2020-04-11 MED ORDER — INSULIN GLARGINE 100 UNIT/ML ~~LOC~~ SOLN
4.0000 [IU] | Freq: Every day | SUBCUTANEOUS | Status: DC
  Administered 2020-04-11 – 2020-04-12 (×2): 4 [IU] via SUBCUTANEOUS
  Filled 2020-04-11 (×3): qty 0.04

## 2020-04-11 NOTE — Discharge Summary (Signed)
Physician Discharge Summary  [name] WUJ:[id number] DOB: 1960-05-30 DOA: 04/08/2020  PCP: [name], MD  Admit date: 04/08/2020 Discharge date: 04/11/2020  Time spent: 38 minutes  Recommendations for Outpatient Follow-up:  1. Insulin dosages adjusted this hospital stay cut back significantly Lantus needs outpatient follow-up and adjustment as per specialist versus PCP 2. Labs at next lab draw with dialysis--- May need to adjust EDW because feels "washed out with dialysis"--- given Feraheme this admission please coordinate for EPO and may be repeat dosing as outpatient 3. Outpatient nephrology follow-up as needed 4. Note increase sertraline to 150 only 1 month supply given-psychiatrist or PCP can determine whether dissected to another SSRI  Discharge Diagnoses:  Principal Problem:   Acute metabolic encephalopathy Active Problems:   GERD (gastroesophageal reflux disease)   CVA (cerebral vascular accident) ([place])   ESRD on dialysis ([place])   DM (diabetes mellitus), type 2 with complications ([place])   Hypoglycemia due to insulin   Lactic acidosis   Anemia due to end stage renal disease [facility])   Discharge Condition: Improved  Diet recommendation: Diabetic renal  Filed Weights   04/09/20 0449 04/10/20 0406 04/11/20 0432  Weight: 61.4 kg 60.9 kg 65.8 kg    History of present illness:  59 year old black male ESRD since 04/2019 JAARS with right AV fistula baseline weight 59 kg  Brittle DM TY 2 with poor control and noncompliance documented on several hospital stays HTN CVA right-sided weakness press syndrome 06/2019 2/2 uncontrolled blood pressure-started on Keppra at that admission (subsequently discontinued) chronic bilateral ICA occlusion reflux severe pulmonary hypertension on 4 L of oxygen baseline plus mitral regurg chronic EtOH in 2020  Presented from home with toxic metabolic encephalopathy secondary to severe hypoglycemia in the setting of trying to  treat high blood sugar 441-CBG on arrival 28-patient treated 2 A of D50-also hypothermic core temperature of 90 placed on Bair hugger lactic acidosis 6 therefore Rx sepsis-CXR = volume overload Given unclear etiology mesenteric CT obtained which was negative  Hospital Course:  1. Toxic metabolic encephalopathy secondary to severe hypoglycemia a. Much improved after admission and felt to be secondary to significant hypoglycemia b. No further work-up 2. Brittle diabetes mellitus type 2 with very widely varying blood sugars a. Sugars as high as 410 as low as 40--this seems to occur to some degree at home b. Insulin was adjusted to a specialized scale during hospital stay-Lantus was held but resumed on discharge of 4 units given sugars in the 350 range and he was kept on his short acting insulin low-dose 3 units with meals 3. Possible sepsis on admission--? Mesenteric ischemia on admission but not found on CT a. Received 1 dose of broad-spectrum IV antibiotics vancomycin on admission b. CT angiogram  [-] no further work-up and we discontinued antibiotics 4. Pancreatic pseudocysts 7.8 cm, 5.3 cm 5. Moderate constipation a. Chronic--MiraLAX, senna stopped as had copious diarrhea 11/28 b. And given a dose of loperamide for diarrhea c. He will need outpatient follow-up for his pseudocysts if they become symptomatic 6. Anemia with thrombocytopenia component of dilution a. Spuriously blood count of 6 so no need for transfusion b. His TSAT low this admission-Feraheme given 1127  7. HTN with prior press syndrome 06/2019 a. Continue Coreg 6.25 twice daily meals, hydralazine 25 twice daily and Entresto 8. Depression a. Wife tells me he doesn't move around and stays in bed all day every day--doesn't play with his dog and sleep all day and sleep night b.  Has been depressed since initiation of dialysis in addition to loss of his driver's license secondary to DWIs in the past several months c. Increase  sertraline to 150 and outpatient follow-up with psychiatry and PCP for further management 9. Chronic respiratory failure on 2 to 4 L of oxygen--been using for this year a. Continue the same   Nonacute medical issues as below 10. Internal hemorrhoids and polypectomy 02/2019 11. Amputation left great toe 12/11/2019 12. Medical noncompliance 13. Prior ethanol use  Procedures:    Consultations:  Nephrology  Discharge Exam: Vitals:   04/11/20 0432 04/11/20 0754  BP: (!) 131/57 135/81  Pulse: 80 82  Resp: 18 19  Temp: 97.6 F (36.4 C) (!) 97.5 F (36.4 C)  SpO2: 93% 98%    General: Awake coherent no distress EOMI NCAT but slightly sleepy and tells me the president is Obama Does know he is in the hospital Cardiovascular: S1-S2 no murmur no rub no gallop-not currently on monitors Respiratory: Clear no added sound no rales no rhonchi Quite weak unable to sit up by himself but his baseline seems to be he is bedbound  Discharge Instructions   Discharge Instructions    Diet - low sodium heart healthy   Complete by: As directed    Discharge instructions   Complete by: As directed    Look at your medications carefully as your insulin dosages have changed would recommend lower dosing of your insulin and specifically her Lantus which lasts a long time in the body because of your kidney issues You will need repeat labs at next dialysis Would recommend outpatient follow-up with your primary physician or nephrologist to help coordinate these discussions We have increased 1 your medications for depression sertraline to 150 mg-please take the higher dose please continue other medications as recommended   Increase activity slowly   Complete by: As directed    No wound care   Complete by: As directed      Allergies as of 04/11/2020      Reactions   Ferrous Gluconate Nausea And Vomiting   Other       Medication List    TAKE these medications   aspirin 81 MG EC tablet Take 1  tablet (81 mg total) by mouth daily.   carvedilol 6.25 MG tablet Commonly known as: COREG Take 6.25 mg by mouth 2 (two) times daily with a meal.   Entresto 24-26 MG Generic drug: sacubitril-valsartan Take 1 tablet by mouth 2 (two) times daily.   epoetin alfa 10000 UNIT/ML injection Commonly known as: EPOGEN Inject 0.4 mLs (4,000 Units total) into the vein Every Tuesday,Thursday,and Saturday with dialysis.   feeding supplement (NEPRO CARB STEADY) Liqd Take 237 mLs by mouth 3 (three) times daily between meals.   folic acid 1 MG tablet Commonly known as: FOLVITE Take 1 mg by mouth daily.   hydrALAZINE 25 MG tablet Commonly known as: APRESOLINE Take 1 tablet (25 mg total) by mouth in the morning and at bedtime.   insulin lispro 100 UNIT/ML KwikPen Commonly known as: HumaLOG KwikPen Inject 0.03 mLs (3 Units total) into the skin 3 (three) times daily with meals.   iron polysaccharides 150 MG capsule Commonly known as: NIFEREX Take 1 capsule (150 mg total) by mouth daily.   Lantus SoloStar 100 UNIT/ML Solostar Pen Generic drug: insulin glargine Inject 4 Units into the skin daily. What changed: how much to take   lidocaine-prilocaine cream Commonly known as: EMLA Apply 1 application topically as directed.  melatonin 3 MG Tabs tablet Take 1 tablet by mouth at bedtime.   mirtazapine 7.5 MG tablet Commonly known as: REMERON Take 7.5 mg by mouth at bedtime.   multivitamin Tabs tablet Take 1 tablet by mouth daily.   sertraline 100 MG tablet Commonly known as: ZOLOFT Take 1.5 tablets (150 mg total) by mouth daily. What changed: how much to take   thiamine 100 MG tablet Commonly known as: VITAMIN B-1 Take 100 mg by mouth daily.      Allergies  Allergen Reactions  . Ferrous Gluconate Nausea And Vomiting  . Other       The results of significant diagnostics from this hospitalization (including imaging, microbiology, ancillary and laboratory) are listed below for  reference.    Significant Diagnostic Studies: DG Chest Portable 1 View  Result Date: 04/08/2020 CLINICAL DATA:  Missed dialysis EXAM: PORTABLE CHEST 1 VIEW COMPARISON:  01/09/2020 FINDINGS: Interstitial prominence and ill-defined increased density bilaterally. Probable trace left pleural effusion. Cardiomegaly. IMPRESSION: Cardiomegaly with findings suggestive of pulmonary edema. Electronically Signed   By: Macy Mis M.D.   On: 04/08/2020 08:20   CT Angio Abd/Pel w/ and/or w/o  Result Date: 04/08/2020 CLINICAL DATA:  59 year old patient with end-stage renal disease on hemodialysis. Evaluate for acute mesenteric ischemia. Altered mental status and patient was found to be hypoglycemic. EXAM: CTA ABDOMEN AND PELVIS WITHOUT AND WITH CONTRAST TECHNIQUE: Multidetector CT imaging of the abdomen and pelvis was performed using the standard protocol during bolus administration of intravenous contrast. Multiplanar reconstructed images and MIPs were obtained and reviewed to evaluate the vascular anatomy. CONTRAST:  139mL OMNIPAQUE IOHEXOL 350 MG/ML SOLN COMPARISON:  CT abdomen pelvis 07/17/2019 FINDINGS: VASCULAR Aorta: Atherosclerotic calcifications involving the abdominal aorta without aneurysm or dissection. Celiac: Small amount of mixed plaque at the origin of the celiac trunk causing mild stenosis. Stenosis is 50% or less. Main branch vessels are patent. SMA: Small amount of calcified plaque at the origin of the SMA without significant stenosis. No evidence for dissection or aneurysm. Incidentally, the patient has a replaced right hepatic artery originating from the proximal SMA. Renals: Single bilateral renal arteries are patent. Calcified plaque involving the origin and proximal aspect of the right renal artery causing mild-to-moderate stenosis but difficult to estimate the degree of stenosis. IMA: Patent. Inflow: Bilateral common, internal external iliac arteries have diffuse calcifications but patent.  No significant stenosis involving the common or external iliac arteries. Proximal Outflow: Proximal femoral arteries are patent bilaterally. Veins: SMV, splenic vein and portal veins are patent on the delayed images. Limited evaluation of the IVC and iliac veins due to the timing of the study. Review of the MIP images confirms the above findings. NON-VASCULAR Lower chest: Small to moderate sized bilateral pleural effusions. Bilateral pleural fluid has increased since 07/17/2019. Patchy densities in both lower lobes associated with atelectasis. In addition, there is parenchymal disease in the right lower lobe which raises concern for pneumonia or aspiration. Heart is enlarged, particularly the right atrium. Hepatobiliary: Evidence for periportal edema without a discrete liver lesion. No acute abnormality to the gallbladder. No significant biliary dilatation. Pancreas: Again noted are extensive calcifications throughout the pancreas compatible with chronic pancreatic inflammation. Patient has developed a large low-density structure medial to the pancreatic head. This structure measures roughly 5.3 x 4.1 x 4.3 cm and best seen on sequence 6, image 86. There appears to be low-density material fluid tracking between this collection and another low-density collection in the left upper abdomen which is anterior  to the spleen. The collection in the left upper abdomen measures 7.8 x 5.7 x 6.2 cm. Hounsfield units within these low-density collection are water attenuation and there is no definite gas within the fluid collections. Spleen: Normal in size without focal abnormality. Adrenals/Urinary Tract: No gross abnormality to the adrenal glands. There is enhancement of both kidneys without hydronephrosis. Small amount of fluid in the urinary bladder. Small low-density structure in the right kidney lower pole is too small to definitively characterize but could represent a cyst. Stomach/Bowel: Patient has small rectal tube or  probe. Large amount of stool in the rectum and sigmoid colon. Multiple fluid-filled loops of small bowel in the right abdomen measuring up to 2.2 cm in diameter. Air-fluid collection in the right lower quadrant may be associated the cecum but poorly characterized. No acute abnormality to the stomach. Difficult to evaluate the bowel structures due to the lack of oral contrast and the lack of intra-abdominal fat. Difficult to exclude mesenteric edema. Lymphatic: No significant lymph node enlargement in the abdomen or pelvis. Reproductive: Prostate is unremarkable. Other: No significant ascites.  Diffuse subcutaneous edema. Musculoskeletal: No acute bone abnormality. IMPRESSION: VASCULAR 1. Diffuse atherosclerotic disease in the abdomen and pelvis. Main mesenteric arteries are patent without significant stenosis. Mild stenosis involving the origin and proximal aspect of the celiac trunk. Findings are not suggestive for a chronic mesenteric ischemia pattern. 2. Stenosis involving the right renal artery but difficult to accurately measure the degree of stenosis. 3.  Aortic Atherosclerosis (ICD10-I70.0). NON-VASCULAR 1. Two low-density collections in the upper abdomen. Largest collection is in left upper quadrant measuring up to 7.8 cm. The other fluid collection is medial to the pancreatic head and measures up to 5.3 cm. Findings are suggestive for pseudocyst collections. No evidence for gas within the pseudocysts. Diffuse pancreatic calcifications are compatible with old pancreatic inflammation. 2. Small to moderate sized bilateral pleural effusions with compressive atelectasis in both lower lobes. Cannot exclude parenchymal disease with pneumonia or aspiration in the right lower lobe. 3. Cardiomegaly with enlargement of the right heart. Findings are suggestive for increased right heart pressures. 4. Limited evaluation of the bowel structures. Fluid-filled loops of small bowel in the right abdomen are nonspecific. Large  amount of stool particularly in the distal colon. No evidence for bowel obstruction. Electronically Signed   By: Markus Daft M.D.   On: 04/08/2020 12:14    Microbiology: Recent Results (from the past 240 hour(s))  Blood culture (routine x 2)     Status: None (Preliminary result)   Collection Time: 04/08/20  8:36 AM   Specimen: BLOOD  Result Value Ref Range Status   Specimen Description BLOOD BLOOD LEFT FOREARM  Final   Special Requests   Final    BOTTLES DRAWN AEROBIC AND ANAEROBIC Blood Culture results may not be optimal due to an inadequate volume of blood received in culture bottles   Culture   Final    NO GROWTH 3 DAYS Performed at [facility], [address], [place], [place] [address]    Report Status PENDING  Incomplete  Blood culture (routine x 2)     Status: None (Preliminary result)   Collection Time: 04/08/20  8:45 AM   Specimen: BLOOD  Result Value Ref Range Status   Specimen Description BLOOD LEFT ANTECUBITAL  Final   Special Requests   Final    BOTTLES DRAWN AEROBIC AND ANAEROBIC Blood Culture results may not be optimal due to an excessive volume of blood received in  culture bottles   Culture   Final    NO GROWTH 3 DAYS Performed at [facility], [address] [place], [place] [address]    Report Status PENDING  Incomplete  Resp Panel by RT-PCR (Flu A&B, Covid) Nasopharyngeal Swab     Status: None   Collection Time: 04/08/20  8:45 AM   Specimen: Nasopharyngeal Swab; Nasopharyngeal(NP) swabs in vial transport medium  Result Value Ref Range Status   SARS Coronavirus 2 by RT PCR NEGATIVE NEGATIVE Final    Comment: (NOTE) SARS-CoV-2 target nucleic acids are NOT DETECTED.  The SARS-CoV-2 RNA is generally detectable in upper respiratory specimens during the acute phase of infection. The lowest concentration of SARS-CoV-2 viral copies this assay can detect is 138 copies/mL. A negative result does not preclude SARS-Cov-2 infection and should not be  used as the sole basis for treatment or other patient management decisions. A negative result may occur with  improper specimen collection/handling, submission of specimen other than nasopharyngeal swab, presence of viral mutation(s) within the areas targeted by this assay, and inadequate number of viral copies(<138 copies/mL). A negative result must be combined with clinical observations, patient history, and epidemiological information. The expected result is Negative.  Fact Sheet for Patients:  EntrepreneurPulse.com.au  Fact Sheet for Healthcare Providers:  IncredibleEmployment.be  This test is no t yet approved or cleared by the [place] FDA and  has been authorized for detection and/or diagnosis of SARS-CoV-2 by FDA under an Emergency Use Authorization (EUA). This EUA will remain  in effect (meaning this test can be used) for the duration of the COVID-19 declaration under Section 564(b)(1) of the Act, 21 U.S.C.section 360bbb-3(b)(1), unless the authorization is terminated  or revoked sooner.       Influenza A by PCR NEGATIVE NEGATIVE Final   Influenza B by PCR NEGATIVE NEGATIVE Final    Comment: (NOTE) The Xpert Xpress SARS-CoV-2/FLU/RSV plus assay is intended as an aid in the diagnosis of influenza from Nasopharyngeal swab specimens and should not be used as a sole basis for treatment. Nasal washings and aspirates are unacceptable for Xpert Xpress SARS-CoV-2/FLU/RSV testing.  Fact Sheet for Patients: EntrepreneurPulse.com.au  Fact Sheet for Healthcare Providers: IncredibleEmployment.be  This test is not yet approved or cleared by the [place] FDA and has been authorized for detection and/or diagnosis of SARS-CoV-2 by FDA under an Emergency Use Authorization (EUA). This EUA will remain in effect (meaning this test can be used) for the duration of the COVID-19 declaration under Section  564(b)(1) of the Act, 21 U.S.C. section 360bbb-3(b)(1), unless the authorization is terminated or revoked.  Performed at [facility], [address] [place], [place] [address]      Labs: Basic Metabolic Panel: Recent Labs  Lab 04/08/20 0758 04/09/20 0520 04/11/20 0557  NA 138 138 138  K 4.8 4.9 4.9  CL 99 101 102  CO2 25 28 24   GLUCOSE 328* 112* 310*  BUN 35* 49* 39*  CREATININE 4.01* 4.69* 4.84*  CALCIUM 8.3* 8.2* 8.3*  MG 2.7*  --   --    Liver Function Tests: Recent Labs  Lab 04/08/20 0758  AST 44*  ALT 39  ALKPHOS 221*  BILITOT 0.7  PROT 6.6  ALBUMIN 2.8*   No results for input(s): LIPASE, AMYLASE in the last 168 hours. No results for input(s): AMMONIA in the last 168 hours. CBC: Recent Labs  Lab 04/08/20 0758 04/09/20 0520 04/09/20 0855 04/11/20 0557  WBC 8.3 4.9 5.6 6.3  NEUTROABS 6.5  --  3.9 4.9  HGB 7.0* 6.1* 7.5* 7.3*  HCT 24.4* 20.3* 25.5* 24.2*  MCV 93.1 89.8 90.1 90.0  PLT 188 137* 152 154   Cardiac Enzymes: No results for input(s): CKTOTAL, CKMB, CKMBINDEX, TROPONINI in the last 168 hours. BNP: BNP (last 3 results) Recent Labs    09/16/19 1939 12/07/19 0639 01/09/20 0840  BNP 1,162.0* >4,500.0* >4,885.0*    ProBNP (last 3 results) No results for input(s): PROBNP in the last 8760 hours.  CBG: Recent Labs  Lab 04/10/20 1923 04/10/20 2146 04/10/20 2359 04/11/20 0433 04/11/20 0755  GLUCAP 303* 287* 373* 327* 282*       Signed:  Nita Sells MD   [facility] 04/11/2020, 8:44 AM

## 2020-04-11 NOTE — Care Management Important Message (Signed)
Important Message  Patient Details  Name: [name] MRN: [id number] Date of Birth: 1961/02/12   Medicare Important Message Given:  Yes     Dannette Barbara 04/11/2020, 12:25 PM

## 2020-04-11 NOTE — NC FL2 (Signed)
[place] LEVEL OF CARE SCREENING TOOL     IDENTIFICATION  Patient Name: [name] Birthdate: [birth year] Sex: male Admission Date (Current Location): 04/08/2020  [place] and [place] Number:  Engineering geologist and Address:  [facility], [address] [place], [place] [address]      Provider Number: [id number]  Attending Physician Name and Address:  Nita Sells, MD  Relative Name and Phone Number:       Current Level of Care: Hospital Recommended Level of Care: [place] Prior Approval Number:    Date Approved/Denied:   PASRR Number: [id number]  Discharge Plan: SNF    Current Diagnoses: Patient Active Problem List   Diagnosis Date Noted  . Hypoglycemia due to insulin 04/08/2020  . Lactic acidosis 04/08/2020  . Anemia due to end stage renal disease ([place]) 04/08/2020  . Acute on chronic systolic CHF (congestive heart failure) ([place]) 01/10/2020  . Severe mitral regurgitation 01/10/2020  . Acute on chronic congestive heart failure ([place])   . Acute on chronic respiratory failure with hypoxemia ([place]) 12/29/2019  . Aspiration pneumonia of both lower lobes due to gastric secretions ([place]) 12/29/2019  . Diabetic foot infection ([place]) 12/07/2019  . Acute pulmonary edema ([place]) 12/07/2019  . Paronychia of second toe of left foot 11/09/2019  . Chronic osteomyelitis of toe of left foot ([place]) 11/09/2019  . DM (diabetes mellitus), type 2 with complications ([place]) 56/81/2751  . Hypoglycemia associated with diabetes ([place]) 08/24/2019  . Acute respiratory failure with hypoxia ([place])   . Acute metabolic encephalopathy 70/05/7492  . Hypertensive emergency 07/06/2019  . Acute encephalopathy 07/06/2019  . Seizures due to metabolic disorder ([place]) 49/67/5916  . Hyperosmolar hyperglycemic state (HHS) ([place]) 06/05/2019  . Symptomatic anemia 06/04/2019  . Frequent falls 06/04/2019  . History of CVA (cerebrovascular accident)  06/04/2019  . HCAP (healthcare-associated pneumonia) 06/04/2019  . Acute blood loss anemia 06/04/2019  . Hyperglycemia 05/28/2019  . Fall at home, initial encounter 05/28/2019  . Rib fracture 05/28/2019  . Depression 05/28/2019  . Hypokalemia 05/28/2019  . Weakness 05/27/2019  . ESRD on dialysis ([place])   . Acute renal failure (ARF) ([place]) 04/09/2019  . Polyp of ascending colon   . Diarrhea   . AKI (acute kidney injury) ([place]) 03/04/2019  . Acute kidney injury ([place]) 07/26/2018  . ARF (acute renal failure) ([place]) 07/25/2018  . Bilateral leg numbness 03/19/2018  . Numbness and tingling of both feet 03/19/2018  . CVA (cerebral vascular accident) ([place]) 03/17/2018  . Moderate episode of recurrent major depressive disorder ([place]) 03/11/2018  . UTI (urinary tract infection) 02/27/2018  . Hypoglycemia 01/15/2018  . Type 2 diabetes mellitus without complication, with long-term current use of insulin ([place]) 08/27/2017  . Protein-calorie malnutrition, severe 08/19/2017  . Pancreatitis, acute 08/16/2017  . DKA (diabetic ketoacidoses) 08/16/2017  . HTN (hypertension) 08/16/2017  . HLD (hyperlipidemia) 08/16/2017  . Carotid stenosis 08/02/2016  . GERD (gastroesophageal reflux disease) 08/02/2016  . History of esophagogastroduodenoscopy (EGD) 07/01/2016  . History of recent blood transfusion 07/01/2016  . Acute renal failure superimposed on stage 4 chronic kidney disease ([place]) 07/01/2016  . Hyponatremia 07/01/2016  . Uncontrolled diabetes mellitus ([place]) 07/01/2016  . Alcohol abuse 07/01/2016  . Monilial esophagitis ([place]) 07/01/2016  . Tobacco abuse 07/01/2016  . Confusion 06/29/2016  . Iron deficiency anemia 06/29/2016  . Coagulopathy ([place]) 06/29/2016    Orientation RESPIRATION BLADDER Height & Weight     Self, Situation, Time, Place  O2 ([place] 3L)  Continent Weight: 145 lb (65.8 kg) Height:  5\' 11"  (180.3 cm)  BEHAVIORAL SYMPTOMS/MOOD NEUROLOGICAL BOWEL NUTRITION STATUS      Incontinent Diet  (renal/carb modified with fluid restriction  1200 mL Fluid)  AMBULATORY STATUS COMMUNICATION OF NEEDS Skin   Limited Assist Verbally Normal                       Personal Care Assistance Level of Assistance  Dressing, Feeding, Bathing Bathing Assistance: Limited assistance Feeding assistance: Independent Dressing Assistance: Limited assistance     Functional Limitations Info  Sight, Hearing, Speech Sight Info: Adequate Hearing Info: Adequate Speech Info: Adequate    SPECIAL CARE FACTORS FREQUENCY  PT (By licensed PT), OT (By licensed OT)     PT Frequency: 5x OT Frequency: 5x            Contractures Contractures Info: Not present    Additional Factors Info  Code Status, Allergies Code Status Info: full code Allergies Info: Ferrous Gluconate, Other           Current Medications (04/11/2020):  This is the current hospital active medication list Current Facility-Administered Medications  Medication Dose Route Frequency Provider Last Rate Last Admin  . acetaminophen (TYLENOL) tablet 650 mg  650 mg Oral Q6H PRN Agbata, Tochukwu, MD   650 mg at 04/08/20 2149   Or  . acetaminophen (TYLENOL) suppository 650 mg  650 mg Rectal Q6H PRN Agbata, Tochukwu, MD      . aspirin EC tablet 81 mg  81 mg Oral Daily Agbata, Tochukwu, MD   81 mg at 04/11/20 0906  . carvedilol (COREG) tablet 6.25 mg  6.25 mg Oral BID WC Agbata, Tochukwu, MD   6.25 mg at 04/11/20 0906  . Chlorhexidine Gluconate Cloth 2 % PADS 6 each  6 each Topical Daily Agbata, Tochukwu, MD   6 each at 04/11/20 0907  . epoetin alfa (EPOGEN) injection 10,000 Units  10,000 Units Intravenous Q T,Th,Sa-HD Lavonia Dana, MD   10,000 Units at 04/09/20 1237  . feeding supplement (NEPRO CARB STEADY) liquid 237 mL  237 mL Oral TID BM Agbata, Tochukwu, MD   237 mL at 04/11/20 1028  . folic acid (FOLVITE) tablet 1 mg  1 mg Oral Daily Agbata, Tochukwu, MD   1 mg at 04/11/20 0906  . hydrALAZINE (APRESOLINE) tablet 25 mg  25 mg  Oral BID Agbata, Tochukwu, MD   25 mg at 04/11/20 0906  . insulin aspart (novoLOG) injection 2 Units  2 Units Subcutaneous TID WC Nita Sells, MD   2 Units at 04/11/20 0907  . insulin glargine (LANTUS) injection 4 Units  4 Units Subcutaneous Daily Samtani, Jai-Gurmukh, MD      . iron polysaccharides (NIFEREX) capsule 150 mg  150 mg Oral Daily Agbata, Tochukwu, MD   150 mg at 04/11/20 0906  . lidocaine-prilocaine (EMLA) cream 1 application  1 application Topical UD Agbata, Tochukwu, MD      . melatonin tablet 5 mg  5 mg Oral QHS Agbata, Tochukwu, MD   5 mg at 04/10/20 2139  . mirtazapine (REMERON) tablet 7.5 mg  7.5 mg Oral QHS Agbata, Tochukwu, MD   7.5 mg at 04/10/20 2139  . multivitamin (RENA-VIT) tablet 1 tablet  1 tablet Oral Daily Agbata, Tochukwu, MD   1 tablet at 04/11/20 0906  . ondansetron (ZOFRAN) tablet 4 mg  4 mg Oral Q6H PRN Agbata, Tochukwu, MD   4 mg at 04/08/20 2149   Or  .  ondansetron (ZOFRAN) injection 4 mg  4 mg Intravenous Q6H PRN Agbata, Tochukwu, MD      . sacubitril-valsartan (ENTRESTO) 24-26 mg per tablet  1 tablet Oral BID Agbata, Tochukwu, MD   1 tablet at 04/11/20 0911  . sertraline (ZOLOFT) tablet 150 mg  150 mg Oral Daily Nita Sells, MD   150 mg at 04/11/20 2591     Discharge Medications: Please see discharge summary for a list of discharge medications.  Relevant Imaging Results:  Relevant Lab Results:   Additional Information SSN:[id number]  Gerrianne Scale Azriel Jakob, LCSW

## 2020-04-11 NOTE — Plan of Care (Signed)

## 2020-04-11 NOTE — Progress Notes (Signed)
Called wife to update re: d/c planning will need SNF--did not reach her Ordering covid and hopeful for d/c today to SNF

## 2020-04-11 NOTE — Evaluation (Signed)
Physical Therapy Evaluation Patient Details Name: [name] MRN: [id number] DOB: [birth year] Today's Date: 04/11/2020   History of Present Illness  Pt is admitted for acute metabolic encephalopathy. History includes ESRD on HD, DM, HTN, CVA, and GERD. Pt on baseline level of 2L of O2, however acutely is at 6L of 02.  Clinical Impression  Pt is a pleasant 59 year old male who was admitted for acute metabolic encephalopathy. Pt performs bed mobility with cga and transfers/ambulation with min assist and RW. All mobility performed on 6L of O2 despite pt being on 2L of O2 at baseline. Pt demonstrates deficits with strength/endurance/mobility/balance. Pt reports he isn't at baseline level and can typically ambulate around home with [facility]. Pt needs to use RW at this time for safety due to above mentioned deficits. Unable to get accurate reading of O2 sats- RN aware. Would benefit from skilled PT to address above deficits and promote optimal return to PLOF; recommend transition to STR upon discharge from acute hospitalization.     Follow Up Recommendations SNF    Equipment Recommendations  None recommended by PT    Recommendations for Other Services       Precautions / Restrictions Precautions Precautions: Fall Restrictions Weight Bearing Restrictions: No      Mobility  Bed Mobility Overal bed mobility: Needs Assistance Bed Mobility: Supine to Sit     Supine to sit: Min guard     General bed mobility comments: Takes extended time to get to EOB. Once seated, able to sit with upright posture. All mobility perfomred on 6L of O2.    Transfers Overall transfer level: Needs assistance Equipment used: Rolling walker (2 wheeled) Transfers: Sit to/from Stand Sit to Stand: Min assist         General transfer comment: 2 attempts made without assist to perform transfer. needed min assist for transfer. once standing, upright posture noted.  Ambulation/Gait Ambulation/Gait assistance:  Min assist Gait Distance (Feet): 5 Feet Assistive device: Rolling walker (2 wheeled) Gait Pattern/deviations: Step-to pattern     General Gait Details: ambulated over to recliner using RW. Good safety awareness. Fatigues with exertion.  Stairs            Wheelchair Mobility    Modified Rankin (Stroke Patients Only)       Balance Overall balance assessment: Needs assistance Sitting-balance support: No upper extremity supported Sitting balance-Leahy Scale: Good     Standing balance support: Bilateral upper extremity supported Standing balance-Leahy Scale: Fair                               Pertinent Vitals/Pain Pain Assessment: No/denies pain    Home Living Family/patient expects to be discharged to:: Private residence Living Arrangements: Spouse/significant other Available Help at Discharge: Family;Available PRN/intermittently Type of Home: House Home Access: Level entry     Home Layout: One level Home Equipment: Grab bars - tub/shower;Grab bars - toilet;Cane - single point;Walker - 2 wheels Additional Comments: very vague during history taking    Prior Function Level of Independence: Needs assistance         Comments: Pt reports he ambulates short distances at home using [facility]. Reports falls at home, but vague in amount.     Hand Dominance        Extremity/Trunk Assessment   Upper Extremity Assessment Upper Extremity Assessment: Generalized weakness (B UE grossly 4/5)    Lower Extremity Assessment Lower Extremity Assessment: Generalized weakness (  B LE grossly 3/5)       Communication   Communication: No difficulties  Cognition Arousal/Alertness: Awake/alert Behavior During Therapy: WFL for tasks assessed/performed Overall Cognitive Status: Within Functional Limits for tasks assessed                                        General Comments      Exercises     Assessment/Plan    PT Assessment Patient needs  continued PT services  PT Problem List Decreased strength;Decreased activity tolerance;Decreased balance;Decreased mobility       PT Treatment Interventions Gait training;Therapeutic activities;Therapeutic exercise    PT Goals (Current goals can be found in the Care Plan section)  Acute Rehab PT Goals Patient Stated Goal: to be able to stand from low surface PT Goal Formulation: With patient Time For Goal Achievement: 04/25/20 Potential to Achieve Goals: Good    Frequency Min 2X/week   Barriers to discharge        Co-evaluation               AM-PAC PT "6 Clicks" Mobility  Outcome Measure Help needed turning from your back to your side while in a flat bed without using bedrails?: A Little Help needed moving from lying on your back to sitting on the side of a flat bed without using bedrails?: A Little Help needed moving to and from a bed to a chair (including a wheelchair)?: A Little Help needed standing up from a chair using your arms (e.g., wheelchair or bedside chair)?: A Little Help needed to walk in hospital room?: A Little Help needed climbing 3-5 steps with a railing? : A Lot 6 Click Score: 17    End of Session Equipment Utilized During Treatment: Gait belt;Oxygen Activity Tolerance: Patient tolerated treatment well Patient left: in chair;with chair alarm set Nurse Communication: Mobility status PT Visit Diagnosis: Unsteadiness on feet (R26.81);Muscle weakness (generalized) (M62.81);History of falling (Z91.81);Difficulty in walking, not elsewhere classified (R26.2)    Time: 5885-0277 PT Time Calculation (min) (ACUTE ONLY): 29 min   Charges:   PT Evaluation $PT Eval Low Complexity: 1 Low PT Treatments $Gait Training: 8-22 mins        Greggory Stallion, PT, DPT [phone]  Jayani Rozman 04/11/2020, 11:47 AM

## 2020-04-11 NOTE — TOC Initial Note (Signed)
Transition of Care [facility]) - Initial/Assessment Note    Patient Details  Name: [name] MRN: [id number] Date of Birth: July 21, 1960  Transition of Care [facility]) CM/SW Contact:    Eileen Stanford, LCSW Phone Number: 04/11/2020, 2:19 PM  Clinical Narrative:    Pt states he lives at home with his spouse. Pt states he is agreeable to SNF. Pt wants to be in [place]. CSW read off list to pt and pt wants CSW to see if he can get a bed at [facility]. Referral sent.               Expected Discharge Plan: Skilled Nursing Facility Barriers to Discharge: Continued Medical Work up   Patient Goals and CMS Choice Patient states their goals for this hospitalization and ongoing recovery are:: to get better   Choice offered to / list presented to : Patient  Expected Discharge Plan and Services Expected Discharge Plan: [place] In-house Referral: NA   Post Acute Care Choice: [place] Living arrangements for the past 2 months: Single Family Home Expected Discharge Date: 04/11/20                                    Prior Living Arrangements/Services Living arrangements for the past 2 months: Single Family Home Lives with:: Spouse Patient language and need for interpreter reviewed:: Yes Do you feel safe going back to the place where you live?: Yes      Need for Family Participation in Patient Care: No (Comment) Care giver support system in place?: Yes (comment)   Criminal Activity/Legal Involvement Pertinent to Current Situation/Hospitalization: No - Comment as needed  Activities of Daily Living Home Assistive Devices/Equipment: Cane (specify quad or straight), Walker (specify type) ADL Screening (condition at time of admission) Patient's cognitive ability adequate to safely complete daily activities?: No Is the patient deaf or have difficulty hearing?: No Does the patient have difficulty seeing, even when wearing glasses/contacts?: No Does the  patient have difficulty concentrating, remembering, or making decisions?: Yes Patient able to express need for assistance with ADLs?: Yes Does the patient have difficulty dressing or bathing?: No Independently performs ADLs?: No Communication: Needs assistance Is this a change from baseline?: Pre-admission baseline Dressing (OT): Needs assistance Is this a change from baseline?: Pre-admission baseline Grooming: Needs assistance Is this a change from baseline?: Pre-admission baseline Feeding: Needs assistance Is this a change from baseline?: Pre-admission baseline Bathing: Needs assistance Is this a change from baseline?: Pre-admission baseline Toileting: Needs assistance Is this a change from baseline?: Pre-admission baseline In/Out Bed: Dependent Is this a change from baseline?: Pre-admission baseline Walks in Home: Needs assistance Is this a change from baseline?: Pre-admission baseline Does the patient have difficulty walking or climbing stairs?: Yes Weakness of Legs: Both Weakness of Arms/Hands: Both  Permission Sought/Granted Permission sought to share information with : Family Supports    Share Information with NAME: Hortense Ramal  Permission granted to share info w AGENCY: Dietitian granted to share info w Relationship: spouse     Emotional Assessment Appearance:: Appears stated age Attitude/Demeanor/Rapport: Engaged Affect (typically observed): Accepting, Appropriate Orientation: : Oriented to Self, Oriented to Place, Oriented to  Time, Oriented to Situation Alcohol / Substance Use: Not Applicable Psych Involvement: No (comment)  Admission diagnosis:  Lactic acidosis [E87.2] Hypoglycemia [E16.2] Hypothermia, initial encounter [T68.XXXA] Acute metabolic encephalopathy [K93.26] Sepsis without acute organ dysfunction, due to  unspecified organism [facility]) [A41.9] Patient Active Problem List   Diagnosis Date Noted  . Hypoglycemia due to insulin 04/08/2020   . Lactic acidosis 04/08/2020  . Anemia due to end stage renal disease ([place]) 04/08/2020  . Acute on chronic systolic CHF (congestive heart failure) ([place]) 01/10/2020  . Severe mitral regurgitation 01/10/2020  . Acute on chronic congestive heart failure ([place])   . Acute on chronic respiratory failure with hypoxemia ([place]) 12/29/2019  . Aspiration pneumonia of both lower lobes due to gastric secretions ([place]) 12/29/2019  . Diabetic foot infection ([place]) 12/07/2019  . Acute pulmonary edema ([place]) 12/07/2019  . Paronychia of second toe of left foot 11/09/2019  . Chronic osteomyelitis of toe of left foot ([place]) 11/09/2019  . DM (diabetes mellitus), type 2 with complications ([place]) 83/38/2505  . Hypoglycemia associated with diabetes ([place]) 08/24/2019  . Acute respiratory failure with hypoxia ([place])   . Acute metabolic encephalopathy 39/76/7341  . Hypertensive emergency 07/06/2019  . Acute encephalopathy 07/06/2019  . Seizures due to metabolic disorder ([place]) 93/79/0240  . Hyperosmolar hyperglycemic state (HHS) ([place]) 06/05/2019  . Symptomatic anemia 06/04/2019  . Frequent falls 06/04/2019  . History of CVA (cerebrovascular accident) 06/04/2019  . HCAP (healthcare-associated pneumonia) 06/04/2019  . Acute blood loss anemia 06/04/2019  . Hyperglycemia 05/28/2019  . Fall at home, initial encounter 05/28/2019  . Rib fracture 05/28/2019  . Depression 05/28/2019  . Hypokalemia 05/28/2019  . Weakness 05/27/2019  . ESRD on dialysis ([place])   . Acute renal failure (ARF) ([place]) 04/09/2019  . Polyp of ascending colon   . Diarrhea   . AKI (acute kidney injury) ([place]) 03/04/2019  . Acute kidney injury ([place]) 07/26/2018  . ARF (acute renal failure) ([place]) 07/25/2018  . Bilateral leg numbness 03/19/2018  . Numbness and tingling of both feet 03/19/2018  . CVA (cerebral vascular accident) ([place]) 03/17/2018  . Moderate episode of recurrent major depressive disorder ([place]) 03/11/2018  . UTI (urinary tract infection)  02/27/2018  . Hypoglycemia 01/15/2018  . Type 2 diabetes mellitus without complication, with long-term current use of insulin ([place]) 08/27/2017  . Protein-calorie malnutrition, severe 08/19/2017  . Pancreatitis, acute 08/16/2017  . DKA (diabetic ketoacidoses) 08/16/2017  . HTN (hypertension) 08/16/2017  . HLD (hyperlipidemia) 08/16/2017  . Carotid stenosis 08/02/2016  . GERD (gastroesophageal reflux disease) 08/02/2016  . History of esophagogastroduodenoscopy (EGD) 07/01/2016  . History of recent blood transfusion 07/01/2016  . Acute renal failure superimposed on stage 4 chronic kidney disease ([place]) 07/01/2016  . Hyponatremia 07/01/2016  . Uncontrolled diabetes mellitus ([place]) 07/01/2016  . Alcohol abuse 07/01/2016  . Monilial esophagitis ([place]) 07/01/2016  . Tobacco abuse 07/01/2016  . Confusion 06/29/2016  . Iron deficiency anemia 06/29/2016  . Coagulopathy ([place]) 06/29/2016   PCP:  [name], MD Pharmacy:   [facility] [phone] - [address], [address] [place] [address] Phone: [phone] Fax: [phone]     Social Determinants of Health (SDOH) Interventions    Readmission Risk Interventions Readmission Risk Prevention Plan 04/08/2020 12/29/2019 12/09/2019  Transportation Screening Complete Complete Complete  PCP or Specialist Appt within 3-5 Days - - -  HRI or [place] - - -  Palliative Care Screening - - -  Medication Review (RN Care Manager) Complete Complete Complete  PCP or Specialist appointment within 3-5 days of discharge Complete Complete Complete  HRI or Home Care Consult Complete Complete Complete  SW Recovery Care/Counseling  Consult Complete Complete Complete  Palliative Care Screening Not Applicable Not Applicable Not Applicable  Skilled Nursing Facility Not Applicable Not Applicable Not Applicable  Some recent data might be hidden

## 2020-04-12 LAB — GLUCOSE, CAPILLARY
Glucose-Capillary: 100 mg/dL — ABNORMAL HIGH (ref 70–99)
Glucose-Capillary: 119 mg/dL — ABNORMAL HIGH (ref 70–99)
Glucose-Capillary: 134 mg/dL — ABNORMAL HIGH (ref 70–99)
Glucose-Capillary: 144 mg/dL — ABNORMAL HIGH (ref 70–99)
Glucose-Capillary: 254 mg/dL — ABNORMAL HIGH (ref 70–99)

## 2020-04-12 NOTE — TOC Progression Note (Addendum)
Transition of Care [facility]) - Progression Note    Patient Details  Name: [name] MRN: [id number] Date of Birth: 1960-08-04  Transition [facility]) CM/SW Contact  Eileen Stanford, LCSW Phone Number: 04/12/2020, 10:39 AM  Clinical Narrative:   [facility] has accepted and will have a bed available tomorrow (12/1) for patient, per Magda Paganini with Admissions. Per MD in progression pt is refusing SNF. CSW unable to find a [place] agency to accept pt given insurance. If pt decides to d/c home he will not have any services. When CSW spoke with pt yesterday he was agreeable to [facility]. Pt currently in HD. CSW spoke with pt's spouse and states it is up to the patient but states that she would not be able to take pt to outpatient PT do to her work hours. CSW will follow up with pt.    Expected Discharge Plan: [place] Barriers to Discharge: Continued Medical Work up  Expected Discharge Plan and Services Expected Discharge Plan: [place] In-house Referral: NA   Post Acute Care Choice: [place] Living arrangements for the past 2 months: Single Family Home Expected Discharge Date: 04/12/20                                     Social Determinants of Health (SDOH) Interventions    Readmission Risk Interventions Readmission Risk Prevention Plan 04/08/2020 12/29/2019 12/09/2019  Transportation Screening Complete Complete Complete  PCP or Specialist Appt within 3-5 Days - - -  HRI or [place] - - -  Palliative Care Screening - - -  Medication Review (RN Care Manager) Complete Complete Complete  PCP or Specialist appointment within 3-5 days of discharge Complete Complete Complete  HRI or Home Care Consult Complete Complete Complete  SW Recovery Care/Counseling Consult Complete Complete Complete  Palliative Care Screening Not Applicable Not Applicable Not [place] Not Applicable Not Applicable Not  Applicable  Some recent data might be hidden

## 2020-04-12 NOTE — Progress Notes (Addendum)
[facility]  ROUNDING NOTE   Subjective:   Patient resting in bed, in no acute distress. We are planning for dialysis today as per his regular schedule.  Objective:  Vital signs in last 24 hours:  Temp:  [97.4 F (36.3 C)-98 F (36.7 C)] 97.4 F (36.3 C) (11/30 1036) Pulse Rate:  [74-84] 83 (11/30 1415) Resp:  [16-24] 17 (11/30 1415) BP: (115-155)/(66-86) 131/69 (11/30 1415) SpO2:  [82 %-100 %] 100 % (11/30 1036) Weight:  [61.4 kg] 61.4 kg (11/30 0409)  Weight change: -4.372 kg Filed Weights   04/10/20 0406 04/11/20 0432 04/12/20 0409  Weight: 60.9 kg 65.8 kg 61.4 kg    Intake/Output: I/O last 3 completed shifts: In: 1140 [P.O.:1140] Out: -    Intake/Output this shift:  No intake/output data recorded.  Physical Exam: General:  In no acute distress  Head: .Moist oral mucosal membranes  Eyes:  Sclerae and conjunctivae clear  Lungs:   Respiration symmetrical, unlabored, lungs clear  Heart:  S1-S2, no rubs or gallops  Abdomen:  Soft, nontender, nondistended  Extremities:  no peripheral edema.  Neurologic:  awake, alert, able to answer simple questions appropriately  Skin: No acute lesions or rashes  Access: Right AVF +bruit,+thrill    Basic Metabolic Panel: Recent Labs  Lab 04/08/20 0758 04/09/20 0520 04/11/20 0557  NA 138 138 138  K 4.8 4.9 4.9  CL 99 101 102  CO2 25 28 24   GLUCOSE 328* 112* 310*  BUN 35* 49* 39*  CREATININE 4.01* 4.69* 4.84*  CALCIUM 8.3* 8.2* 8.3*  MG 2.7*  --   --     Liver Function Tests: Recent Labs  Lab 04/08/20 0758  AST 44*  ALT 39  ALKPHOS 221*  BILITOT 0.7  PROT 6.6  ALBUMIN 2.8*   No results for input(s): LIPASE, AMYLASE in the last 168 hours. No results for input(s): AMMONIA in the last 168 hours.  CBC: Recent Labs  Lab 04/08/20 0758 04/09/20 0520 04/09/20 0855 04/11/20 0557  WBC 8.3 4.9 5.6 6.3  NEUTROABS 6.5  --  3.9 4.9  HGB 7.0* 6.1* 7.5* 7.3*  HCT 24.4* 20.3* 25.5* 24.2*  MCV 93.1  89.8 90.1 90.0  PLT 188 137* 152 154    Cardiac Enzymes: No results for input(s): CKTOTAL, CKMB, CKMBINDEX, TROPONINI in the last 168 hours.  BNP: Invalid input(s): POCBNP  CBG: Recent Labs  Lab 04/11/20 2121 04/12/20 0000 04/12/20 0433 04/12/20 0451 04/12/20 0752  GLUCAP 197* 254* 144* 134* 100*    Microbiology: Results for orders placed or performed during the hospital encounter of 04/08/20  Blood culture (routine x 2)     Status: None (Preliminary result)   Collection Time: 04/08/20  8:36 AM   Specimen: BLOOD  Result Value Ref Range Status   Specimen Description BLOOD BLOOD LEFT FOREARM  Final   Special Requests   Final    BOTTLES DRAWN AEROBIC AND ANAEROBIC Blood Culture results may not be optimal due to an inadequate volume of blood received in culture bottles   Culture   Final    NO GROWTH 4 DAYS Performed at [facility], [address] [place], [place] [address]    Report Status PENDING  Incomplete  Blood culture (routine x 2)     Status: None (Preliminary result)   Collection Time: 04/08/20  8:45 AM   Specimen: BLOOD  Result Value Ref Range Status   Specimen Description BLOOD LEFT ANTECUBITAL  Final   Special Requests  Final    BOTTLES DRAWN AEROBIC AND ANAEROBIC Blood Culture results may not be optimal due to an excessive volume of blood received in culture bottles   Culture   Final    NO GROWTH 4 DAYS Performed at [facility], [address] [place], [place] [address]    Report Status PENDING  Incomplete  Resp Panel by RT-PCR (Flu A&B, Covid) Nasopharyngeal Swab     Status: None   Collection Time: 04/08/20  8:45 AM   Specimen: Nasopharyngeal Swab; Nasopharyngeal(NP) swabs in vial transport medium  Result Value Ref Range Status   SARS Coronavirus 2 by RT PCR NEGATIVE NEGATIVE Final    Comment: (NOTE) SARS-CoV-2 target nucleic acids are NOT DETECTED.  The SARS-CoV-2 RNA is generally detectable in upper respiratory specimens  during the acute phase of infection. The lowest concentration of SARS-CoV-2 viral copies this assay can detect is 138 copies/mL. A negative result does not preclude SARS-Cov-2 infection and should not be used as the sole basis for treatment or other patient management decisions. A negative result may occur with  improper specimen collection/handling, submission of specimen other than nasopharyngeal swab, presence of viral mutation(s) within the areas targeted by this assay, and inadequate number of viral copies(<138 copies/mL). A negative result must be combined with clinical observations, patient history, and epidemiological information. The expected result is Negative.  Fact Sheet for Patients:  EntrepreneurPulse.com.au  Fact Sheet for Healthcare Providers:  IncredibleEmployment.be  This test is no t yet approved or cleared by the [place] FDA and  has been authorized for detection and/or diagnosis of SARS-CoV-2 by FDA under an Emergency Use Authorization (EUA). This EUA will remain  in effect (meaning this test can be used) for the duration of the COVID-19 declaration under Section 564(b)(1) of the Act, 21 U.S.C.section 360bbb-3(b)(1), unless the authorization is terminated  or revoked sooner.       Influenza A by PCR NEGATIVE NEGATIVE Final   Influenza B by PCR NEGATIVE NEGATIVE Final    Comment: (NOTE) The Xpert Xpress SARS-CoV-2/FLU/RSV plus assay is intended as an aid in the diagnosis of influenza from Nasopharyngeal swab specimens and should not be used as a sole basis for treatment. Nasal washings and aspirates are unacceptable for Xpert Xpress SARS-CoV-2/FLU/RSV testing.  Fact Sheet for Patients: EntrepreneurPulse.com.au  Fact Sheet for Healthcare Providers: IncredibleEmployment.be  This test is not yet approved or cleared by the [place] FDA and has been authorized for detection  and/or diagnosis of SARS-CoV-2 by FDA under an Emergency Use Authorization (EUA). This EUA will remain in effect (meaning this test can be used) for the duration of the COVID-19 declaration under Section 564(b)(1) of the Act, 21 U.S.C. section 360bbb-3(b)(1), unless the authorization is terminated or revoked.  Performed at [facility], [address] [place], [place] [address]   SARS Coronavirus 2 by RT PCR (hospital order, performed in [facility] hospital lab) Nasopharyngeal Nasopharyngeal Swab     Status: None   Collection Time: 04/11/20 11:36 AM   Specimen: Nasopharyngeal Swab  Result Value Ref Range Status   SARS Coronavirus 2 NEGATIVE NEGATIVE Final    Comment: (NOTE) SARS-CoV-2 target nucleic acids are NOT DETECTED.  The SARS-CoV-2 RNA is generally detectable in upper and lower respiratory specimens during the acute phase of infection. The lowest concentration of SARS-CoV-2 viral copies this assay can detect is 250 copies / mL. A negative result does not preclude SARS-CoV-2 infection and should not be used as the sole basis  for treatment or other patient management decisions.  A negative result may occur with improper specimen collection / handling, submission of specimen other than nasopharyngeal swab, presence of viral mutation(s) within the areas targeted by this assay, and inadequate number of viral copies (<250 copies / mL). A negative result must be combined with clinical observations, patient history, and epidemiological information.  Fact Sheet for Patients:   StrictlyIdeas.no  Fact Sheet for Healthcare Providers: BankingDealers.co.za  This test is not yet approved or  cleared by the [place] FDA and has been authorized for detection and/or diagnosis of SARS-CoV-2 by FDA under an Emergency Use Authorization (EUA).  This EUA will remain in effect (meaning this test can be used) for the duration of  the COVID-19 declaration under Section 564(b)(1) of the Act, 21 U.S.C. section 360bbb-3(b)(1), unless the authorization is terminated or revoked sooner.  Performed at [facility], [address] [place], [place] [address]     Coagulation Studies: No results for input(s): LABPROT, INR in the last 72 hours.  Urinalysis: Recent Labs    04/10/20 0330  COLORURINE YELLOW*  LABSPEC 1.017  PHURINE 7.0  GLUCOSEU >=500*  HGBUR NEGATIVE  BILIRUBINUR NEGATIVE  KETONESUR NEGATIVE  PROTEINUR >=300*  NITRITE NEGATIVE  LEUKOCYTESUR NEGATIVE      Imaging: No results found.   Medications:    . aspirin EC  81 mg Oral Daily  . carvedilol  6.25 mg Oral BID WC  . Chlorhexidine Gluconate Cloth  6 each Topical Daily  . epoetin alfa  10,000 Units Intravenous Q T,Th,Sa-HD  . feeding supplement (NEPRO CARB STEADY)  237 mL Oral TID BM  . folic acid  1 mg Oral Daily  . hydrALAZINE  25 mg Oral BID  . insulin aspart  0-6 Units Subcutaneous TID WC  . insulin glargine  4 Units Subcutaneous Daily  . iron polysaccharides  150 mg Oral Daily  . lidocaine-prilocaine  1 application Topical UD  . melatonin  5 mg Oral QHS  . mirtazapine  7.5 mg Oral QHS  . multivitamin  1 tablet Oral Daily  . sacubitril-valsartan  1 tablet Oral BID  . sertraline  150 mg Oral Daily   acetaminophen **OR** acetaminophen, ondansetron **OR** ondansetron (ZOFRAN) IV  Assessment/ Plan:  [name] is a 59 y.o. black male with end stage renal disease on hemodialysis, hypertension, diabetes mellitus type II, hyperlipidemia, history of alcohol abuse who was admitted on 04/08/2020 for Lactic acidosis [E87.2] Hypoglycemia [E16.2] Hypothermia, initial encounter [T68.XXXA] Acute metabolic encephalopathy [W96.75] Sepsis without acute organ dysfunction, due to unspecified organism ([place]) [A41.9]  [place]. Right AVF 59.5kg  1. End Stage Renal Disease  Dialysis treatment today Orders  completed We will continue TTS schedule while hospitalized  2. Anemia with chronic kidney disease Lab Results  Component Value Date   HGB 7.3 (L) 04/11/2020  Continue Epogen with dialysis treatments  3. Hypertension Blood pressure readings within acceptable range Receiving hydralazine, Entresto and carvedilol    4. Secondary Hyperparathyroidism Lab Results  Component Value Date   CALCIUM 8.3 (L) 04/11/2020   CAION 1.00 (L) 12/25/2019   PHOS 6.6 (H) 12/26/2019  Continue monitoring bone mineral metabolism parameters   LOS: 4 Princy Raju 11/30/20212:31 PM  I saw and evaluated the patient and discussed the care with Crosby Oyster, DNP.  I agree with the findings and plan as documented in the note.    Murlean Iba , MD [facility] 11/30/20215:34 PM

## 2020-04-12 NOTE — Progress Notes (Signed)
PT Cancellation Note  Patient Details Name: [name] MRN: [id number] DOB: 06/02/1960   Cancelled Treatment:    Reason Eval/Treat Not Completed: Other (comment). Pt currently in HD, not available for therapy. Will re-attempt another time.   Brycelynn Stampley 04/12/2020, 10:59 AM  Greggory Stallion, PT, DPT [phone]

## 2020-04-12 NOTE — Progress Notes (Addendum)
Patient seen and examined on reviewed this morning He absolutely is insistent on going home-I had a chat with his wife [name] who asks for home health to follow Please see updated discharge summary for insulin changes and recommendations  No charge  Verneita Griffes, MD [facility] 9:12 AM

## 2020-04-12 NOTE — Plan of Care (Signed)

## 2020-04-12 NOTE — Discharge Summary (Signed)
Physician Discharge Summary  [name] ZYS:[id number] DOB: Dec 07, 1960 DOA: 04/08/2020  PCP: [name], MD  Admit date: 04/08/2020 Discharge date: 04/12/2020  Time spent: 38 minutes  Recommendations for Outpatient Follow-up:  1. Insulin dosages adjusted this hospital stay cut back significantly Lantus needs outpatient follow-up and adjustment as per specialist versus PCP 2. Labs at next lab draw with dialysis--- May need to adjust EDW because feels "washed out with dialysis"--- given Feraheme this admission please coordinate for EPO and may be repeat dosing as outpatient 3. Outpatient nephrology follow-up as needed 4. Note increase sertraline to 150 only 1 month supply given-psychiatrist or PCP can determine whether dissected to another SSRI  Discharge Diagnoses:  Principal Problem:   Acute metabolic encephalopathy Active Problems:   GERD (gastroesophageal reflux disease)   CVA (cerebral vascular accident) ([place])   ESRD on dialysis ([place])   DM (diabetes mellitus), type 2 with complications ([place])   Hypoglycemia due to insulin   Lactic acidosis   Anemia due to end stage renal disease [facility])   Discharge Condition: Improved  Diet recommendation: Diabetic renal  Filed Weights   04/10/20 0406 04/11/20 0432 04/12/20 0409  Weight: 60.9 kg 65.8 kg 61.4 kg    History of present illness:  59 year old black male ESRD since 04/2019 [place] with right AV fistula baseline weight 59 kg  Brittle DM TY 2 with poor control and noncompliance documented on several hospital stays HTN CVA right-sided weakness press syndrome 06/2019 2/2 uncontrolled blood pressure-started on Keppra at that admission (subsequently discontinued) chronic bilateral ICA occlusion reflux severe pulmonary hypertension on 4 L of oxygen baseline plus mitral regurg chronic EtOH in 2020  Presented from home with toxic metabolic encephalopathy secondary to severe hypoglycemia in the setting of trying to  treat high blood sugar 441-CBG on arrival 28-patient treated 2 A of D50-also hypothermic core temperature of 90 placed on Bair hugger lactic acidosis 6 therefore Rx sepsis-CXR = volume overload Given unclear etiology mesenteric CT obtained which was negative  Hospital Course:  1. Toxic metabolic encephalopathy secondary to severe hypoglycemia a. Much improved after admission and felt to be secondary to significant hypoglycemia b. No further work-up 2. Brittle diabetes mellitus type 2 with very widely varying blood sugars a. Sugars as high as 410 as low as 40--this seems to occur to some degree at home b. Insulin was adjusted to a specialized scale during hospital stay-Lantus was held but resumed on discharge of 4 units given sugars in the 350 range and he was kept on his short acting insulin low-dose 3 units with meals 3. Possible sepsis on admission--? Mesenteric ischemia on admission but not found on CT a. Received 1 dose of broad-spectrum IV antibiotics vancomycin on admission b. CT angiogram  [-] no further work-up and we discontinued antibiotics 4. Pancreatic pseudocysts 7.8 cm, 5.3 cm 5. Moderate constipation a. Chronic--MiraLAX, senna stopped as had copious diarrhea 11/28 b. And given a dose of loperamide for diarrhea c. He will need outpatient follow-up for his pseudocysts if they become symptomatic 6. Anemia with thrombocytopenia component of dilution a. Spuriously blood count of 6 so no need for transfusion b. His TSAT low this admission-Feraheme given 1127  7. HTN with prior press syndrome 06/2019 a. Continue Coreg 6.25 twice daily meals, hydralazine 25 twice daily and Entresto 8. Depression a. Wife tells me he doesn't move around and stays in bed all day every day--doesn't play with his dog and sleep all day and sleep night b.  Has been depressed since initiation of dialysis in addition to loss of his driver's license secondary to DWIs in the past several months c. Increase  sertraline to 150 and outpatient follow-up with psychiatry and PCP for further management 9. Chronic respiratory failure on 2 to 4 L of oxygen--been using for this year a. Continue the same   Nonacute medical issues as below 10. Internal hemorrhoids and polypectomy 02/2019 11. Amputation left great toe 12/11/2019 12. Medical noncompliance 13. Prior ethanol use  Procedures:    Consultations:  Nephrology  Discharge Exam: Vitals:   04/12/20 0409 04/12/20 0750  BP: (!) 155/75 126/78  Pulse: 76 77  Resp: 20 16  Temp:  97.9 F (36.6 C)  SpO2: (!) 82% 91%    General: Awake coherent no distress EOMI NCAT but slightly sleepy and tells me the president is Obama Does know he is in the hospital Cardiovascular: S1-S2 no murmur no rub no gallop-not currently on monitors Respiratory: Clear no added sound no rales no rhonchi Quite weak unable to sit up by himself but his baseline seems to be he is bedbound  Discharge Instructions   Discharge Instructions    Diet - low sodium heart healthy   Complete by: As directed    Discharge instructions   Complete by: As directed    Look at your medications carefully as your insulin dosages have changed would recommend lower dosing of your insulin and specifically her Lantus which lasts a long time in the body because of your kidney issues You will need repeat labs at next dialysis Would recommend outpatient follow-up with your primary physician or nephrologist to help coordinate these discussions We have increased 1 your medications for depression sertraline to 150 mg-please take the higher dose please continue other medications as recommended   Increase activity slowly   Complete by: As directed    No wound care   Complete by: As directed      Allergies as of 04/12/2020      Reactions   Ferrous Gluconate Nausea And Vomiting   Other       Medication List    TAKE these medications   aspirin 81 MG EC tablet Take 1 tablet (81 mg  total) by mouth daily.   carvedilol 6.25 MG tablet Commonly known as: COREG Take 6.25 mg by mouth 2 (two) times daily with a meal.   Entresto 24-26 MG Generic drug: sacubitril-valsartan Take 1 tablet by mouth 2 (two) times daily.   epoetin alfa 10000 UNIT/ML injection Commonly known as: EPOGEN Inject 0.4 mLs (4,000 Units total) into the vein Every Tuesday,Thursday,and Saturday with dialysis.   feeding supplement (NEPRO CARB STEADY) Liqd Take 237 mLs by mouth 3 (three) times daily between meals.   folic acid 1 MG tablet Commonly known as: FOLVITE Take 1 mg by mouth daily.   hydrALAZINE 25 MG tablet Commonly known as: APRESOLINE Take 1 tablet (25 mg total) by mouth in the morning and at bedtime.   insulin lispro 100 UNIT/ML KwikPen Commonly known as: HumaLOG KwikPen Inject 0.03 mLs (3 Units total) into the skin 3 (three) times daily with meals.   iron polysaccharides 150 MG capsule Commonly known as: NIFEREX Take 1 capsule (150 mg total) by mouth daily.   Lantus SoloStar 100 UNIT/ML Solostar Pen Generic drug: insulin glargine Inject 4 Units into the skin daily. What changed: how much to take   lidocaine-prilocaine cream Commonly known as: EMLA Apply 1 application topically as directed.   melatonin 3  MG Tabs tablet Take 1 tablet by mouth at bedtime.   mirtazapine 7.5 MG tablet Commonly known as: REMERON Take 7.5 mg by mouth at bedtime.   multivitamin Tabs tablet Take 1 tablet by mouth daily.   sertraline 100 MG tablet Commonly known as: ZOLOFT Take 1.5 tablets (150 mg total) by mouth daily. What changed: how much to take   thiamine 100 MG tablet Commonly known as: VITAMIN B-1 Take 100 mg by mouth daily.      Allergies  Allergen Reactions  . Ferrous Gluconate Nausea And Vomiting  . Other       The results of significant diagnostics from this hospitalization (including imaging, microbiology, ancillary and laboratory) are listed below for reference.     Significant Diagnostic Studies: DG Chest Portable 1 View  Result Date: 04/08/2020 CLINICAL DATA:  Missed dialysis EXAM: PORTABLE CHEST 1 VIEW COMPARISON:  01/09/2020 FINDINGS: Interstitial prominence and ill-defined increased density bilaterally. Probable trace left pleural effusion. Cardiomegaly. IMPRESSION: Cardiomegaly with findings suggestive of pulmonary edema. Electronically Signed   By: Macy Mis M.D.   On: 04/08/2020 08:20   CT Angio Abd/Pel w/ and/or w/o  Result Date: 04/08/2020 CLINICAL DATA:  59 year old patient with end-stage renal disease on hemodialysis. Evaluate for acute mesenteric ischemia. Altered mental status and patient was found to be hypoglycemic. EXAM: CTA ABDOMEN AND PELVIS WITHOUT AND WITH CONTRAST TECHNIQUE: Multidetector CT imaging of the abdomen and pelvis was performed using the standard protocol during bolus administration of intravenous contrast. Multiplanar reconstructed images and MIPs were obtained and reviewed to evaluate the vascular anatomy. CONTRAST:  144mL OMNIPAQUE IOHEXOL 350 MG/ML SOLN COMPARISON:  CT abdomen pelvis 07/17/2019 FINDINGS: VASCULAR Aorta: Atherosclerotic calcifications involving the abdominal aorta without aneurysm or dissection. Celiac: Small amount of mixed plaque at the origin of the celiac trunk causing mild stenosis. Stenosis is 50% or less. Main branch vessels are patent. SMA: Small amount of calcified plaque at the origin of the SMA without significant stenosis. No evidence for dissection or aneurysm. Incidentally, the patient has a replaced right hepatic artery originating from the proximal SMA. Renals: Single bilateral renal arteries are patent. Calcified plaque involving the origin and proximal aspect of the right renal artery causing mild-to-moderate stenosis but difficult to estimate the degree of stenosis. IMA: Patent. Inflow: Bilateral common, internal external iliac arteries have diffuse calcifications but patent. No  significant stenosis involving the common or external iliac arteries. Proximal Outflow: Proximal femoral arteries are patent bilaterally. Veins: SMV, splenic vein and portal veins are patent on the delayed images. Limited evaluation of the IVC and iliac veins due to the timing of the study. Review of the MIP images confirms the above findings. NON-VASCULAR Lower chest: Small to moderate sized bilateral pleural effusions. Bilateral pleural fluid has increased since 07/17/2019. Patchy densities in both lower lobes associated with atelectasis. In addition, there is parenchymal disease in the right lower lobe which raises concern for pneumonia or aspiration. Heart is enlarged, particularly the right atrium. Hepatobiliary: Evidence for periportal edema without a discrete liver lesion. No acute abnormality to the gallbladder. No significant biliary dilatation. Pancreas: Again noted are extensive calcifications throughout the pancreas compatible with chronic pancreatic inflammation. Patient has developed a large low-density structure medial to the pancreatic head. This structure measures roughly 5.3 x 4.1 x 4.3 cm and best seen on sequence 6, image 86. There appears to be low-density material fluid tracking between this collection and another low-density collection in the left upper abdomen which is anterior to the  spleen. The collection in the left upper abdomen measures 7.8 x 5.7 x 6.2 cm. Hounsfield units within these low-density collection are water attenuation and there is no definite gas within the fluid collections. Spleen: Normal in size without focal abnormality. Adrenals/Urinary Tract: No gross abnormality to the adrenal glands. There is enhancement of both kidneys without hydronephrosis. Small amount of fluid in the urinary bladder. Small low-density structure in the right kidney lower pole is too small to definitively characterize but could represent a cyst. Stomach/Bowel: Patient has small rectal tube or  probe. Large amount of stool in the rectum and sigmoid colon. Multiple fluid-filled loops of small bowel in the right abdomen measuring up to 2.2 cm in diameter. Air-fluid collection in the right lower quadrant may be associated the cecum but poorly characterized. No acute abnormality to the stomach. Difficult to evaluate the bowel structures due to the lack of oral contrast and the lack of intra-abdominal fat. Difficult to exclude mesenteric edema. Lymphatic: No significant lymph node enlargement in the abdomen or pelvis. Reproductive: Prostate is unremarkable. Other: No significant ascites.  Diffuse subcutaneous edema. Musculoskeletal: No acute bone abnormality. IMPRESSION: VASCULAR 1. Diffuse atherosclerotic disease in the abdomen and pelvis. Main mesenteric arteries are patent without significant stenosis. Mild stenosis involving the origin and proximal aspect of the celiac trunk. Findings are not suggestive for a chronic mesenteric ischemia pattern. 2. Stenosis involving the right renal artery but difficult to accurately measure the degree of stenosis. 3.  Aortic Atherosclerosis (ICD10-I70.0). NON-VASCULAR 1. Two low-density collections in the upper abdomen. Largest collection is in left upper quadrant measuring up to 7.8 cm. The other fluid collection is medial to the pancreatic head and measures up to 5.3 cm. Findings are suggestive for pseudocyst collections. No evidence for gas within the pseudocysts. Diffuse pancreatic calcifications are compatible with old pancreatic inflammation. 2. Small to moderate sized bilateral pleural effusions with compressive atelectasis in both lower lobes. Cannot exclude parenchymal disease with pneumonia or aspiration in the right lower lobe. 3. Cardiomegaly with enlargement of the right heart. Findings are suggestive for increased right heart pressures. 4. Limited evaluation of the bowel structures. Fluid-filled loops of small bowel in the right abdomen are nonspecific. Large  amount of stool particularly in the distal colon. No evidence for bowel obstruction. Electronically Signed   By: Markus Daft M.D.   On: 04/08/2020 12:14    Microbiology: Recent Results (from the past 240 hour(s))  Blood culture (routine x 2)     Status: None (Preliminary result)   Collection Time: 04/08/20  8:36 AM   Specimen: BLOOD  Result Value Ref Range Status   Specimen Description BLOOD BLOOD LEFT FOREARM  Final   Special Requests   Final    BOTTLES DRAWN AEROBIC AND ANAEROBIC Blood Culture results may not be optimal due to an inadequate volume of blood received in culture bottles   Culture   Final    NO GROWTH 4 DAYS Performed at [facility], [address], [place], [place] [address]    Report Status PENDING  Incomplete  Blood culture (routine x 2)     Status: None (Preliminary result)   Collection Time: 04/08/20  8:45 AM   Specimen: BLOOD  Result Value Ref Range Status   Specimen Description BLOOD LEFT ANTECUBITAL  Final   Special Requests   Final    BOTTLES DRAWN AEROBIC AND ANAEROBIC Blood Culture results may not be optimal due to an excessive volume of blood received in culture bottles  Culture   Final    NO GROWTH 4 DAYS Performed at [facility], [address] [place], [place] [address]    Report Status PENDING  Incomplete  Resp Panel by RT-PCR (Flu A&B, Covid) Nasopharyngeal Swab     Status: None   Collection Time: 04/08/20  8:45 AM   Specimen: Nasopharyngeal Swab; Nasopharyngeal(NP) swabs in vial transport medium  Result Value Ref Range Status   SARS Coronavirus 2 by RT PCR NEGATIVE NEGATIVE Final    Comment: (NOTE) SARS-CoV-2 target nucleic acids are NOT DETECTED.  The SARS-CoV-2 RNA is generally detectable in upper respiratory specimens during the acute phase of infection. The lowest concentration of SARS-CoV-2 viral copies this assay can detect is 138 copies/mL. A negative result does not preclude SARS-Cov-2 infection and should not be  used as the sole basis for treatment or other patient management decisions. A negative result may occur with  improper specimen collection/handling, submission of specimen other than nasopharyngeal swab, presence of viral mutation(s) within the areas targeted by this assay, and inadequate number of viral copies(<138 copies/mL). A negative result must be combined with clinical observations, patient history, and epidemiological information. The expected result is Negative.  Fact Sheet for Patients:  EntrepreneurPulse.com.au  Fact Sheet for Healthcare Providers:  IncredibleEmployment.be  This test is no t yet approved or cleared by the [place] FDA and  has been authorized for detection and/or diagnosis of SARS-CoV-2 by FDA under an Emergency Use Authorization (EUA). This EUA will remain  in effect (meaning this test can be used) for the duration of the COVID-19 declaration under Section 564(b)(1) of the Act, 21 U.S.C.section 360bbb-3(b)(1), unless the authorization is terminated  or revoked sooner.       Influenza A by PCR NEGATIVE NEGATIVE Final   Influenza B by PCR NEGATIVE NEGATIVE Final    Comment: (NOTE) The Xpert Xpress SARS-CoV-2/FLU/RSV plus assay is intended as an aid in the diagnosis of influenza from Nasopharyngeal swab specimens and should not be used as a sole basis for treatment. Nasal washings and aspirates are unacceptable for Xpert Xpress SARS-CoV-2/FLU/RSV testing.  Fact Sheet for Patients: EntrepreneurPulse.com.au  Fact Sheet for Healthcare Providers: IncredibleEmployment.be  This test is not yet approved or cleared by the [place] FDA and has been authorized for detection and/or diagnosis of SARS-CoV-2 by FDA under an Emergency Use Authorization (EUA). This EUA will remain in effect (meaning this test can be used) for the duration of the COVID-19 declaration under Section  564(b)(1) of the Act, 21 U.S.C. section 360bbb-3(b)(1), unless the authorization is terminated or revoked.  Performed at [facility], [address] [place],  [address]   SARS Coronavirus 2 by RT PCR (hospital order, performed in [facility] hospital lab) Nasopharyngeal Nasopharyngeal Swab     Status: None   Collection Time: 04/11/20 11:36 AM   Specimen: Nasopharyngeal Swab  Result Value Ref Range Status   SARS Coronavirus 2 NEGATIVE NEGATIVE Final    Comment: (NOTE) SARS-CoV-2 target nucleic acids are NOT DETECTED.  The SARS-CoV-2 RNA is generally detectable in upper and lower respiratory specimens during the acute phase of infection. The lowest concentration of SARS-CoV-2 viral copies this assay can detect is 250 copies / mL. A negative result does not preclude SARS-CoV-2 infection and should not be used as the sole basis for treatment or other patient management decisions.  A negative result may occur with improper specimen collection / handling, submission of specimen other than nasopharyngeal swab, presence of viral  mutation(s) within the areas targeted by this assay, and inadequate number of viral copies (<250 copies / mL). A negative result must be combined with clinical observations, patient history, and epidemiological information.  Fact Sheet for Patients:   StrictlyIdeas.no  Fact Sheet for Healthcare Providers: BankingDealers.co.za  This test is not yet approved or  cleared by the [place] FDA and has been authorized for detection and/or diagnosis of SARS-CoV-2 by FDA under an Emergency Use Authorization (EUA).  This EUA will remain in effect (meaning this test can be used) for the duration of the COVID-19 declaration under Section 564(b)(1) of the Act, 21 U.S.C. section 360bbb-3(b)(1), unless the authorization is terminated or revoked sooner.  Performed at [facility], [address] [place], [place] [address]      Labs: Basic Metabolic Panel: Recent Labs  Lab 04/08/20 0758 04/09/20 0520 04/11/20 0557  NA 138 138 138  K 4.8 4.9 4.9  CL 99 101 102  CO2 25 28 24   GLUCOSE 328* 112* 310*  BUN 35* 49* 39*  CREATININE 4.01* 4.69* 4.84*  CALCIUM 8.3* 8.2* 8.3*  MG 2.7*  --   --    Liver Function Tests: Recent Labs  Lab 04/08/20 0758  AST 44*  ALT 39  ALKPHOS 221*  BILITOT 0.7  PROT 6.6  ALBUMIN 2.8*   No results for input(s): LIPASE, AMYLASE in the last 168 hours. No results for input(s): AMMONIA in the last 168 hours. CBC: Recent Labs  Lab 04/08/20 0758 04/09/20 0520 04/09/20 0855 04/11/20 0557  WBC 8.3 4.9 5.6 6.3  NEUTROABS 6.5  --  3.9 4.9  HGB 7.0* 6.1* 7.5* 7.3*  HCT 24.4* 20.3* 25.5* 24.2*  MCV 93.1 89.8 90.1 90.0  PLT 188 137* 152 154   Cardiac Enzymes: No results for input(s): CKTOTAL, CKMB, CKMBINDEX, TROPONINI in the last 168 hours. BNP: BNP (last 3 results) Recent Labs    09/16/19 1939 12/07/19 0639 01/09/20 0840  BNP 1,162.0* >4,500.0* >4,885.0*    ProBNP (last 3 results) No results for input(s): PROBNP in the last 8760 hours.  CBG: Recent Labs  Lab 04/11/20 2121 04/12/20 0000 04/12/20 0433 04/12/20 0451 04/12/20 0752  GLUCAP 197* 254* 144* 134* 100*       Signed:  Nita Sells MD   [facility] 04/12/2020, 9:14 AM

## 2020-04-12 NOTE — TOC Transition Note (Signed)
Transition of Care [facility] - CM/SW Discharge Note   Patient Details  Name: CHAZE HRUSKA MRN: [id number] Date of Birth: [birth year]  [facility] CM/SW Contact:  Eileen Stanford, LCSW Phone Number: 04/12/2020, 4:13 PM   Clinical Narrative:   Again pt declined SNF with the understanding that he will not get any services at home. Pt's spouse states she will get someone to come and pick pt up.    Final next level of care: Home/Self Care Barriers to Discharge: No Barriers Identified   Patient Goals and CMS Choice Patient states their goals for this hospitalization and ongoing recovery are:: to get better   Choice offered to / list presented to : Patient  Discharge Placement                Patient to be transferred to facility by: family Name of family member notified: spouse Patient and family notified of of transfer: 04/12/20  Discharge Plan and Services In-house Referral: NA   Post Acute Care Choice: [place]                               Social Determinants of Health (SDOH) Interventions     Readmission Risk Interventions Readmission Risk Prevention Plan 04/08/2020 12/29/2019 12/09/2019  Transportation Screening Complete Complete Complete  PCP or Specialist Appt within 3-5 Days - - -  HRI or [place] - - -  Palliative Care Screening - - -  Medication Review ([place]) Complete Complete Complete  PCP or Specialist appointment within 3-5 days of discharge Complete Complete Complete  HRI or Home Care Consult Complete Complete Complete  SW Recovery Care/Counseling Consult Complete Complete Complete  Palliative Care Screening Not Applicable Not Applicable Not [place] Not Applicable Not Applicable Not Applicable  Some recent data might be hidden

## 2020-04-12 NOTE — Plan of Care (Signed)
  Problem: Clinical Measurements: Goal: Will remain free from infection Outcome: Progressing Goal: Diagnostic test results will improve Outcome: Progressing Goal: Respiratory complications will improve Outcome: Progressing Goal: Cardiovascular complication will be avoided Outcome: Progressing   Problem: Nutrition: Goal: Adequate nutrition will be maintained Outcome: Progressing   Problem: Coping: Goal: Level of anxiety will decrease Outcome: Progressing   Problem: Elimination: Goal: Will not experience complications related to urinary retention Outcome: Progressing   Problem: Pain Managment: Goal: General experience of comfort will improve Outcome: Progressing   Problem: Safety: Goal: Ability to remain free from injury will improve Outcome: Progressing   Problem: Skin Integrity: Goal: Risk for impaired skin integrity will decrease Outcome: Progressing   

## 2020-04-12 NOTE — Progress Notes (Signed)
Pt in bed. Pt alert and oriented x 2. Pt disoriented to place and situation. Pt was irritable and agitated at the beginning of the shift. Pt stated he wants to go home and asked why he did not get discharged today. Will continue pt care.

## 2020-04-13 LAB — CULTURE, BLOOD (ROUTINE X 2)
Culture: NO GROWTH
Culture: NO GROWTH

## 2020-04-18 ENCOUNTER — Other Ambulatory Visit (HOSPITAL_COMMUNITY): Payer: Self-pay

## 2020-04-18 NOTE — Progress Notes (Signed)
Today had set up appt with [name] and his wife.  When arrived [name] was walking out to go off with a friend.  Sat down with his wife and went through his meds.  Called [facility] and they advised they have some of his meds ready and waiting on a doctor for his iron.  Advised her I will come back tomorrow when she has all his medications and start filling his boxes for 2 weeks at a time.  She states he will be here around 11 am tomorrow from dialysis.  Will visit with him also.  Will continue to visit for heart failure, diet and medication compliance.   [place] [phone]

## 2020-04-19 ENCOUNTER — Other Ambulatory Visit (HOSPITAL_COMMUNITY): Payer: Self-pay

## 2020-04-19 NOTE — Progress Notes (Signed)
Today went back to [place] to fill his medications boxes up for his wife.  Explained the boxes to [place] and he states he is aware of how to use them.  He has just came in from [place] and eating breakfast.  He does not watch what he eats or how much fluids he drinks.  He states likes coke.  He was eating sausage, eggs and waffle.  He was using sugar free syrup.  Drinking grape juice.  His wife states his sugar read high yesterday and was in 500's this morning.  He has since took his insulin right before eating.  He does not want to be disturbed, he is wanting to finish eating and lay down.  He says dialysis makes him tired and drained.  This is first time he has been up and talking with me.  He sat at the table with me while I fixed his medications.  He does not have his iron or thiamine.  Wife will ask the doctor tomorrow for refills on them.  He has a gentleman coming 4 days a week to take him places and do things for him during the day.  He appears to be getting along well with him, he started this week.  His wife works a lot and is gone.  He had a fall last night requiring EMS to come out, no injuries.  He states his balance is off.  PT has stopped, advised wife to ask his PCP about continuing PT either at home or at a facility.  He could benefit form more PT.  She has made notes to ask the PCP tomorrow.  He appears to be doing better per his wife, he is wanting to go places with this aid that is now coming.  He went off with him yesterday riding around to a few places.  He is not laying in bed depressed all the time.  Discussed importance of diet and watching his fluids but he did not appear interested in changing.  He is ambulatory with rollator but appears very unsteady.  Will continue to visit for heart failure, diet and medication compliance.   Taylor [phone]

## 2020-04-20 ENCOUNTER — Inpatient Hospital Stay: Payer: Medicare Other

## 2020-04-20 ENCOUNTER — Emergency Department: Payer: Medicare Other

## 2020-04-20 ENCOUNTER — Other Ambulatory Visit: Payer: Self-pay

## 2020-04-20 ENCOUNTER — Inpatient Hospital Stay
Admission: EM | Admit: 2020-04-20 | Discharge: 2020-04-22 | DRG: 189 | Disposition: A | Discharge status: Home / Self Care | Payer: Medicare Other | Attending: Internal Medicine | Admitting: Internal Medicine

## 2020-04-20 ENCOUNTER — Encounter: Payer: Self-pay | Admitting: Internal Medicine

## 2020-04-20 DIAGNOSIS — E11649 Type 2 diabetes mellitus with hypoglycemia without coma: Secondary | ICD-10-CM | POA: Diagnosis present

## 2020-04-20 DIAGNOSIS — F32A Depression, unspecified: Secondary | ICD-10-CM | POA: Diagnosis present

## 2020-04-20 DIAGNOSIS — I132 Hypertensive heart and chronic kidney disease with heart failure and with stage 5 chronic kidney disease, or end stage renal disease: Secondary | ICD-10-CM | POA: Diagnosis present

## 2020-04-20 DIAGNOSIS — A419 Sepsis, unspecified organism: Secondary | ICD-10-CM

## 2020-04-20 DIAGNOSIS — K219 Gastro-esophageal reflux disease without esophagitis: Secondary | ICD-10-CM | POA: Diagnosis present

## 2020-04-20 DIAGNOSIS — E1122 Type 2 diabetes mellitus with diabetic chronic kidney disease: Secondary | ICD-10-CM | POA: Diagnosis present

## 2020-04-20 DIAGNOSIS — E118 Type 2 diabetes mellitus with unspecified complications: Secondary | ICD-10-CM | POA: Diagnosis not present

## 2020-04-20 DIAGNOSIS — Z888 Allergy status to other drugs, medicaments and biological substances status: Secondary | ICD-10-CM

## 2020-04-20 DIAGNOSIS — Z8249 Family history of ischemic heart disease and other diseases of the circulatory system: Secondary | ICD-10-CM | POA: Diagnosis not present

## 2020-04-20 DIAGNOSIS — J690 Pneumonitis due to inhalation of food and vomit: Secondary | ICD-10-CM | POA: Diagnosis present

## 2020-04-20 DIAGNOSIS — Z89422 Acquired absence of other left toe(s): Secondary | ICD-10-CM

## 2020-04-20 DIAGNOSIS — E162 Hypoglycemia, unspecified: Secondary | ICD-10-CM

## 2020-04-20 DIAGNOSIS — I5022 Chronic systolic (congestive) heart failure: Secondary | ICD-10-CM | POA: Diagnosis present

## 2020-04-20 DIAGNOSIS — E785 Hyperlipidemia, unspecified: Secondary | ICD-10-CM | POA: Diagnosis present

## 2020-04-20 DIAGNOSIS — N186 End stage renal disease: Secondary | ICD-10-CM | POA: Diagnosis present

## 2020-04-20 DIAGNOSIS — F17290 Nicotine dependence, other tobacco product, uncomplicated: Secondary | ICD-10-CM | POA: Diagnosis present

## 2020-04-20 DIAGNOSIS — N2581 Secondary hyperparathyroidism of renal origin: Secondary | ICD-10-CM | POA: Diagnosis present

## 2020-04-20 DIAGNOSIS — D631 Anemia in chronic kidney disease: Secondary | ICD-10-CM | POA: Diagnosis present

## 2020-04-20 DIAGNOSIS — Z20822 Contact with and (suspected) exposure to covid-19: Secondary | ICD-10-CM | POA: Diagnosis present

## 2020-04-20 DIAGNOSIS — E1165 Type 2 diabetes mellitus with hyperglycemia: Secondary | ICD-10-CM | POA: Diagnosis present

## 2020-04-20 DIAGNOSIS — G9341 Metabolic encephalopathy: Secondary | ICD-10-CM | POA: Diagnosis present

## 2020-04-20 DIAGNOSIS — R680 Hypothermia, not associated with low environmental temperature: Secondary | ICD-10-CM | POA: Diagnosis present

## 2020-04-20 DIAGNOSIS — W19XXXA Unspecified fall, initial encounter: Secondary | ICD-10-CM

## 2020-04-20 DIAGNOSIS — Z7982 Long term (current) use of aspirin: Secondary | ICD-10-CM

## 2020-04-20 DIAGNOSIS — Z79899 Other long term (current) drug therapy: Secondary | ICD-10-CM

## 2020-04-20 DIAGNOSIS — R4182 Altered mental status, unspecified: Secondary | ICD-10-CM

## 2020-04-20 DIAGNOSIS — Z841 Family history of disorders of kidney and ureter: Secondary | ICD-10-CM

## 2020-04-20 DIAGNOSIS — J189 Pneumonia, unspecified organism: Secondary | ICD-10-CM | POA: Diagnosis present

## 2020-04-20 DIAGNOSIS — Z992 Dependence on renal dialysis: Secondary | ICD-10-CM

## 2020-04-20 DIAGNOSIS — J9621 Acute and chronic respiratory failure with hypoxia: Secondary | ICD-10-CM

## 2020-04-20 DIAGNOSIS — J9601 Acute respiratory failure with hypoxia: Principal | ICD-10-CM | POA: Diagnosis present

## 2020-04-20 DIAGNOSIS — J440 Chronic obstructive pulmonary disease with acute lower respiratory infection: Secondary | ICD-10-CM | POA: Diagnosis present

## 2020-04-20 DIAGNOSIS — Z8673 Personal history of transient ischemic attack (TIA), and cerebral infarction without residual deficits: Secondary | ICD-10-CM | POA: Diagnosis not present

## 2020-04-20 DIAGNOSIS — Z794 Long term (current) use of insulin: Secondary | ICD-10-CM

## 2020-04-20 DIAGNOSIS — J9600 Acute respiratory failure, unspecified whether with hypoxia or hypercapnia: Secondary | ICD-10-CM | POA: Diagnosis present

## 2020-04-20 LAB — COMPREHENSIVE METABOLIC PANEL
ALT: 41 U/L (ref 0–44)
AST: 39 U/L (ref 15–41)
Albumin: 2.8 g/dL — ABNORMAL LOW (ref 3.5–5.0)
Alkaline Phosphatase: 348 U/L — ABNORMAL HIGH (ref 38–126)
Anion gap: 9 (ref 5–15)
BUN: 30 mg/dL — ABNORMAL HIGH (ref 6–20)
CO2: 27 mmol/L (ref 22–32)
Calcium: 8.5 mg/dL — ABNORMAL LOW (ref 8.9–10.3)
Chloride: 100 mmol/L (ref 98–111)
Creatinine, Ser: 3.59 mg/dL — ABNORMAL HIGH (ref 0.61–1.24)
GFR, Estimated: 19 mL/min — ABNORMAL LOW (ref >60)
Glucose, Bld: 47 mg/dL — ABNORMAL LOW (ref 70–99)
Potassium: 4.9 mmol/L (ref 3.5–5.1)
Sodium: 136 mmol/L (ref 135–145)
Total Bilirubin: 0.5 mg/dL (ref 0.3–1.2)
Total Protein: 6.6 g/dL (ref 6.5–8.1)

## 2020-04-20 LAB — GLUCOSE, CAPILLARY
Glucose-Capillary: 127 mg/dL — ABNORMAL HIGH (ref 70–99)
Glucose-Capillary: 50 mg/dL — ABNORMAL LOW (ref 70–99)
Glucose-Capillary: 57 mg/dL — ABNORMAL LOW (ref 70–99)
Glucose-Capillary: 74 mg/dL (ref 70–99)
Glucose-Capillary: 95 mg/dL (ref 70–99)

## 2020-04-20 LAB — CBC WITH DIFFERENTIAL/PLATELET
Abs Immature Granulocytes: 0.03 10*3/uL (ref 0.00–0.07)
Basophils Absolute: 0 10*3/uL (ref 0.0–0.1)
Basophils Relative: 1 %
Eosinophils Absolute: 0 10*3/uL (ref 0.0–0.5)
Eosinophils Relative: 1 %
HCT: 26.9 % — ABNORMAL LOW (ref 39.0–52.0)
Hemoglobin: 8 g/dL — ABNORMAL LOW (ref 13.0–17.0)
Immature Granulocytes: 1 %
Lymphocytes Relative: 27 %
Lymphs Abs: 1.5 10*3/uL (ref 0.7–4.0)
MCH: 28.5 pg (ref 26.0–34.0)
MCHC: 29.7 g/dL — ABNORMAL LOW (ref 30.0–36.0)
MCV: 95.7 fL (ref 80.0–100.0)
Monocytes Absolute: 0.4 10*3/uL (ref 0.1–1.0)
Monocytes Relative: 6 %
Neutro Abs: 3.7 10*3/uL (ref 1.7–7.7)
Neutrophils Relative %: 64 %
Platelets: 246 10*3/uL (ref 150–400)
RBC: 2.81 MIL/uL — ABNORMAL LOW (ref 4.22–5.81)
RDW: 21.4 % — ABNORMAL HIGH (ref 11.5–15.5)
Smear Review: NORMAL
WBC: 5.7 10*3/uL (ref 4.0–10.5)
nRBC: 0 % (ref 0.0–0.2)

## 2020-04-20 LAB — PROTIME-INR
INR: 1.3 — ABNORMAL HIGH (ref 0.8–1.2)
Prothrombin Time: 15.2 seconds (ref 11.4–15.2)

## 2020-04-20 LAB — BRAIN NATRIURETIC PEPTIDE: B Natriuretic Peptide: >4500 pg/mL — ABNORMAL HIGH (ref 0.0–100.0)

## 2020-04-20 LAB — MRSA PCR SCREENING: MRSA by PCR: NEGATIVE

## 2020-04-20 LAB — RESP PANEL BY RT-PCR (FLU A&B, COVID) ARPGX2
Influenza A by PCR: NEGATIVE
Influenza B by PCR: NEGATIVE
SARS Coronavirus 2 by RT PCR: NEGATIVE

## 2020-04-20 LAB — CBG MONITORING, ED
Glucose-Capillary: 129 mg/dL — ABNORMAL HIGH (ref 70–99)
Glucose-Capillary: 59 mg/dL — ABNORMAL LOW (ref 70–99)
Glucose-Capillary: 57 mg/dL — ABNORMAL LOW (ref 70–99)
Glucose-Capillary: 89 mg/dL (ref 70–99)

## 2020-04-20 LAB — LACTIC ACID, PLASMA
Lactic Acid, Venous: 1.8 mmol/L (ref 0.5–1.9)
Lactic Acid, Venous: 2.3 mmol/L (ref 0.5–1.9)

## 2020-04-20 LAB — PHOSPHORUS: Phosphorus: 2.8 mg/dL (ref 2.5–4.6)

## 2020-04-20 LAB — APTT: aPTT: 40 seconds — ABNORMAL HIGH (ref 24–36)

## 2020-04-20 LAB — TROPONIN I (HIGH SENSITIVITY)
Troponin I (High Sensitivity): 12 ng/L (ref <18)
Troponin I (High Sensitivity): 10 ng/L (ref <18)

## 2020-04-20 MED ORDER — HYDROMORPHONE HCL 1 MG/ML IJ SOLN: 0.5000 mg | INTRAMUSCULAR | Status: DC | PRN

## 2020-04-20 MED ORDER — ACETAMINOPHEN 325 MG PO TABS: 650.0000 mg | ORAL_TABLET | Freq: Four times a day (QID) | ORAL | Status: DC | PRN

## 2020-04-20 MED ORDER — ONDANSETRON HCL 4 MG PO TABS: 4.0000 mg | ORAL_TABLET | Freq: Four times a day (QID) | ORAL | Status: DC | PRN

## 2020-04-20 MED ORDER — MELATONIN 5 MG PO TABS
5.0000 mg | ORAL_TABLET | Freq: Every day | ORAL | Status: DC
  Administered 2020-04-20 – 2020-04-21 (×2): 5 mg via ORAL
  Filled 2020-04-20 (×2): qty 1

## 2020-04-20 MED ORDER — ASPIRIN EC 81 MG PO TBEC
81.0000 mg | DELAYED_RELEASE_TABLET | Freq: Every day | ORAL | Status: DC
  Administered 2020-04-21 – 2020-04-22 (×2): 81 mg via ORAL
  Filled 2020-04-20 (×3): qty 1

## 2020-04-20 MED ORDER — ACETAMINOPHEN 650 MG RE SUPP: 650.0000 mg | Freq: Four times a day (QID) | RECTAL | Status: DC | PRN

## 2020-04-20 MED ORDER — VANCOMYCIN HCL IN DEXTROSE 1-5 GM/200ML-% IV SOLN
1000.0000 mg | Freq: Once | INTRAVENOUS | Status: AC
  Administered 2020-04-20: 1000 mg via INTRAVENOUS
  Filled 2020-04-20: qty 200

## 2020-04-20 MED ORDER — LIDOCAINE-PRILOCAINE 2.5-2.5 % EX CREA
1.0000 "application " | TOPICAL_CREAM | Freq: Every day | CUTANEOUS | Status: DC | PRN
  Filled 2020-04-20: qty 5

## 2020-04-20 MED ORDER — SERTRALINE HCL 50 MG PO TABS
150.0000 mg | ORAL_TABLET | Freq: Every day | ORAL | Status: DC
  Administered 2020-04-21 – 2020-04-22 (×2): 150 mg via ORAL
  Filled 2020-04-20 (×3): qty 3

## 2020-04-20 MED ORDER — DEXTROSE 50 % IV SOLN
INTRAVENOUS | Status: AC
  Filled 2020-04-20: qty 50

## 2020-04-20 MED ORDER — ONDANSETRON HCL 4 MG/2ML IJ SOLN: 4.0000 mg | Freq: Four times a day (QID) | INTRAMUSCULAR | Status: DC | PRN

## 2020-04-20 MED ORDER — RENA-VITE PO TABS
1.0000 | ORAL_TABLET | Freq: Every day | ORAL | Status: DC
  Administered 2020-04-21 – 2020-04-22 (×2): 1 via ORAL
  Filled 2020-04-20 (×3): qty 1

## 2020-04-20 MED ORDER — PENTAFLUOROPROP-TETRAFLUOROETH EX AERO: 1.0000 "application " | INHALATION_SPRAY | CUTANEOUS | Status: DC | PRN

## 2020-04-20 MED ORDER — LACTATED RINGERS IV SOLN: INTRAVENOUS | Status: AC

## 2020-04-20 MED ORDER — LIDOCAINE-PRILOCAINE 2.5-2.5 % EX CREA
1.0000 "application " | TOPICAL_CREAM | CUTANEOUS | Status: DC | PRN
  Filled 2020-04-20: qty 5

## 2020-04-20 MED ORDER — DEXTROSE 50 % IV SOLN
1.0000 | Freq: Once | INTRAVENOUS | Status: AC
  Administered 2020-04-20: 50 mL via INTRAVENOUS

## 2020-04-20 MED ORDER — SODIUM CHLORIDE 0.9 % IV BOLUS
1000.0000 mL | Freq: Once | INTRAVENOUS | Status: AC
  Administered 2020-04-20: 1000 mL via INTRAVENOUS

## 2020-04-20 MED ORDER — SODIUM CHLORIDE 0.9 % IV SOLN
3.0000 g | Freq: Two times a day (BID) | INTRAVENOUS | Status: DC
  Administered 2020-04-20 – 2020-04-22 (×4): 3 g via INTRAVENOUS
  Filled 2020-04-20 (×3): qty 8
  Filled 2020-04-20: qty 0.18
  Filled 2020-04-20: qty 3
  Filled 2020-04-20 (×2): qty 8

## 2020-04-20 MED ORDER — LIDOCAINE HCL (PF) 1 % IJ SOLN
5.0000 mL | INTRAMUSCULAR | Status: DC | PRN
  Filled 2020-04-20: qty 5

## 2020-04-20 MED ORDER — DEXTROSE 50 % IV SOLN
50.0000 mL | Freq: Once | INTRAVENOUS | Status: AC
  Administered 2020-04-20: 50 mL via INTRAVENOUS

## 2020-04-20 MED ORDER — DEXTROSE 10 % IV SOLN: INTRAVENOUS | Status: DC

## 2020-04-20 MED ORDER — HEPARIN SODIUM (PORCINE) 5000 UNIT/ML IJ SOLN
5000.0000 [IU] | Freq: Three times a day (TID) | INTRAMUSCULAR | Status: DC
  Administered 2020-04-20 – 2020-04-22 (×6): 5000 [IU] via SUBCUTANEOUS
  Filled 2020-04-20 (×6): qty 1

## 2020-04-20 MED ORDER — SODIUM CHLORIDE 0.9 % IV SOLN: 100.0000 mL | INTRAVENOUS | Status: DC | PRN

## 2020-04-20 MED ORDER — CHLORHEXIDINE GLUCONATE CLOTH 2 % EX PADS
6.0000 | MEDICATED_PAD | Freq: Every day | CUTANEOUS | Status: DC
  Administered 2020-04-21: 6 via TOPICAL

## 2020-04-20 MED ORDER — FOLIC ACID 1 MG PO TABS
1.0000 mg | ORAL_TABLET | Freq: Every day | ORAL | Status: DC
  Administered 2020-04-21 – 2020-04-22 (×2): 1 mg via ORAL
  Filled 2020-04-20 (×3): qty 1

## 2020-04-20 MED ORDER — DEXTROSE 50 % IV SOLN
INTRAVENOUS | Status: AC
  Administered 2020-04-20: 50 mL
  Filled 2020-04-20: qty 50

## 2020-04-20 MED ORDER — MIRTAZAPINE 15 MG PO TABS
7.5000 mg | ORAL_TABLET | Freq: Every day | ORAL | Status: DC
  Administered 2020-04-20 – 2020-04-21 (×2): 7.5 mg via ORAL
  Filled 2020-04-20 (×2): qty 1

## 2020-04-20 MED ORDER — EPOETIN ALFA 10000 UNIT/ML IJ SOLN
4000.0000 [IU] | INTRAMUSCULAR | Status: DC
  Administered 2020-04-21: 4000 [IU] via INTRAVENOUS

## 2020-04-20 MED ORDER — HYDRALAZINE HCL 25 MG PO TABS
25.0000 mg | ORAL_TABLET | Freq: Two times a day (BID) | ORAL | Status: DC
  Administered 2020-04-20 – 2020-04-22 (×3): 25 mg via ORAL
  Filled 2020-04-20 (×4): qty 1

## 2020-04-20 MED ORDER — HEPARIN SODIUM (PORCINE) 1000 UNIT/ML DIALYSIS: 1000.0000 [IU] | INTRAMUSCULAR | Status: DC | PRN

## 2020-04-20 MED ORDER — ALTEPLASE 2 MG IJ SOLR: 2.0000 mg | Freq: Once | INTRAMUSCULAR | Status: DC | PRN

## 2020-04-20 MED ORDER — SODIUM CHLORIDE 0.9 % IV SOLN
2.0000 g | Freq: Once | INTRAVENOUS | Status: AC
  Administered 2020-04-20: 2 g via INTRAVENOUS
  Filled 2020-04-20: qty 2

## 2020-04-20 MED ORDER — SACUBITRIL-VALSARTAN 24-26 MG PO TABS
1.0000 | ORAL_TABLET | Freq: Two times a day (BID) | ORAL | Status: DC
  Administered 2020-04-22: 1 via ORAL
  Filled 2020-04-20 (×7): qty 1

## 2020-04-20 MED ORDER — CARVEDILOL 6.25 MG PO TABS
6.2500 mg | ORAL_TABLET | Freq: Two times a day (BID) | ORAL | Status: DC
  Administered 2020-04-21 – 2020-04-22 (×3): 6.25 mg via ORAL
  Filled 2020-04-20 (×3): qty 1

## 2020-04-20 MED ORDER — VANCOMYCIN HCL 500 MG/100ML IV SOLN
500.0000 mg | Freq: Once | INTRAVENOUS | Status: AC
  Administered 2020-04-20: 500 mg via INTRAVENOUS
  Filled 2020-04-20 (×2): qty 100

## 2020-04-20 NOTE — ED Notes (Signed)
Lab called and informed that assistance is needed on obtaining blood cultures for the patient.

## 2020-04-20 NOTE — ED Notes (Signed)
Respiratory at bedside.

## 2020-04-20 NOTE — ED Provider Notes (Signed)
[facility] Emergency Department Provider Note   ____________________________________________   First MD Initiated Contact with Patient 04/20/20 [phone]     (approximate)  I have reviewed the triage vital signs and the nursing notes.   HISTORY  Chief Complaint Hypoglycemia    HPI [name] is a 59 y.o. male with a stated past medical history of CKD on dialysis Monday/Wednesday/Friday, type 2 diabetes, COPD, and CHF who presents via EMS for altered mental status. EMS noted patient to be hypoxic and hypoglycemic upon arrival as well as with GCS of 8. EMS noted patient's blood sugar to be in the 30s and attempted to administer glucagon IM. No IV access was able to be established. Further history and review of systems unable to obtain at this time         Past Medical History:  Diagnosis Date  . Chronic kidney disease   . Diabetes mellitus without complication ([place])   . ETOH abuse   . GERD (gastroesophageal reflux disease)   . Hyperlipidemia   . Hypertension   . Stroke [facility])     Patient Active Problem List   Diagnosis Date Noted  . Hypoglycemia due to insulin 04/08/2020  . Lactic acidosis 04/08/2020  . Anemia due to end stage renal disease ([place]) 04/08/2020  . Acute on chronic systolic CHF (congestive heart failure) ([place]) 01/10/2020  . Severe mitral regurgitation 01/10/2020  . Acute on chronic congestive heart failure ([place])   . Acute on chronic respiratory failure with hypoxemia ([place]) 12/29/2019  . Aspiration pneumonia of both lower lobes due to gastric secretions ([place]) 12/29/2019  . Diabetic foot infection ([place]) 12/07/2019  . Acute pulmonary edema ([place]) 12/07/2019  . Paronychia of second toe of left foot 11/09/2019  . Chronic osteomyelitis of toe of left foot ([place]) 11/09/2019  . DM (diabetes mellitus), type 2 with complications ([place]) 77/93/9030  . Hypoglycemia associated with diabetes ([place]) 08/24/2019  . Acute respiratory failure with hypoxia  ([place])   . Acute metabolic encephalopathy 02/03/3006  . Hypertensive emergency 07/06/2019  . Acute encephalopathy 07/06/2019  . Seizures due to metabolic disorder ([place]) 62/26/3335  . Hyperosmolar hyperglycemic state (HHS) ([place]) 06/05/2019  . Symptomatic anemia 06/04/2019  . Frequent falls 06/04/2019  . History of CVA (cerebrovascular accident) 06/04/2019  . HCAP (healthcare-associated pneumonia) 06/04/2019  . Acute blood loss anemia 06/04/2019  . Hyperglycemia 05/28/2019  . Fall at home, initial encounter 05/28/2019  . Rib fracture 05/28/2019  . Depression 05/28/2019  . Hypokalemia 05/28/2019  . Weakness 05/27/2019  . ESRD on dialysis ([place])   . Acute renal failure (ARF) ([place]) 04/09/2019  . Polyp of ascending colon   . Diarrhea   . AKI (acute kidney injury) ([place]) 03/04/2019  . Acute kidney injury ([place]) 07/26/2018  . ARF (acute renal failure) ([place]) 07/25/2018  . Bilateral leg numbness 03/19/2018  . Numbness and tingling of both feet 03/19/2018  . CVA (cerebral vascular accident) ([place]) 03/17/2018  . Moderate episode of recurrent major depressive disorder ([place]) 03/11/2018  . UTI (urinary tract infection) 02/27/2018  . Hypoglycemia 01/15/2018  . Type 2 diabetes mellitus without complication, with long-term current use of insulin ([place]) 08/27/2017  . Protein-calorie malnutrition, severe 08/19/2017  . Pancreatitis, acute 08/16/2017  . DKA (diabetic ketoacidoses) 08/16/2017  . HTN (hypertension) 08/16/2017  . HLD (hyperlipidemia) 08/16/2017  . Carotid stenosis 08/02/2016  . GERD (gastroesophageal reflux disease) 08/02/2016  . History of esophagogastroduodenoscopy (EGD) 07/01/2016  . History of recent blood transfusion 07/01/2016  .  Acute renal failure superimposed on stage 4 chronic kidney disease ([place]) 07/01/2016  . Hyponatremia 07/01/2016  . Uncontrolled diabetes mellitus ([place]) 07/01/2016  . Alcohol abuse 07/01/2016  . Monilial esophagitis ([place]) 07/01/2016  . Tobacco abuse  07/01/2016  . Confusion 06/29/2016  . Iron deficiency anemia 06/29/2016  . Coagulopathy ([place]) 06/29/2016    Past Surgical History:  Procedure Laterality Date  . AMPUTATION TOE Left 12/11/2019   Procedure: AMPUTATION LEFT 2nd TOE;  Surgeon: Sharlotte Alamo, DPM;  Location: [facility] ORS;  Service: Podiatry;  Laterality: Left;  . AV FISTULA PLACEMENT Right 11/25/2019   Procedure: INSERTION OF ARTERIOVENOUS (AV) GORE-TEX GRAFT ARM;  Surgeon: Katha Cabal, MD;  Location: [facility] ORS;  Service: Vascular;  Laterality: Right;  . COLONOSCOPY WITH PROPOFOL N/A 03/06/2019   Procedure: COLONOSCOPY WITH PROPOFOL;  Surgeon: Lucilla Lame, MD;  Location[facility] ENDOSCOPY;  Service: Endoscopy;  Laterality: N/A;  . DIALYSIS/PERMA CATHETER INSERTION N/A 04/10/2019   Procedure: DIALYSIS/PERMA CATHETER INSERTION;  Surgeon: Serafina Mitchell, MD;  Location: [place] CV LAB;  Service: Cardiovascular;  Laterality: N/A;  . DIALYSIS/PERMA CATHETER REMOVAL N/A 03/07/2020   Procedure: DIALYSIS/PERMA CATHETER REMOVAL;  Surgeon: Algernon Huxley, MD;  Location: [place] CV LAB;  Service: Cardiovascular;  Laterality: N/A;  . ESOPHAGOGASTRODUODENOSCOPY (EGD) WITH PROPOFOL N/A 07/01/2016   Procedure: ESOPHAGOGASTRODUODENOSCOPY (EGD) WITH PROPOFOL;  Surgeon: San Jetty, MD;  Location: [facility] ENDOSCOPY;  Service: General;  Laterality: N/A;  . ESOPHAGOGASTRODUODENOSCOPY (EGD) WITH PROPOFOL N/A 12/25/2019   Procedure: ESOPHAGOGASTRODUODENOSCOPY (EGD) WITH PROPOFOL;  Surgeon: Lin Landsman, MD;  Location: [facility] ENDOSCOPY;  Service: Gastroenterology;  Laterality: N/A;  . LOWER EXTREMITY ANGIOGRAPHY Left 12/10/2019   Procedure: Lower Extremity Angiography;  Surgeon: Algernon Huxley, MD;  Location: [place] CV LAB;  Service: Cardiovascular;  Laterality: Left;    Prior to Admission medications   Medication Sig Start Date End Date Taking? Authorizing Provider  aspirin EC 81 MG EC tablet Take 1 tablet (81 mg total) by mouth daily.  06/10/19   Jennye Boroughs, MD  carvedilol (COREG) 6.25 MG tablet Take 6.25 mg by mouth 2 (two) times daily with a meal.  10/28/19   [provider]  ENTRESTO 24-26 MG Take 1 tablet by mouth 2 (two) times daily. 07/29/19   [provider]  epoetin alfa (EPOGEN) 10000 UNIT/ML injection Inject 0.4 mLs (4,000 Units total) into the vein Every Tuesday,Thursday,and Saturday with dialysis. 01/05/20   Enzo Bi, MD  folic acid (FOLVITE) 1 MG tablet Take 1 mg by mouth daily. 03/12/19   [provider]  hydrALAZINE (APRESOLINE) 25 MG tablet Take 1 tablet (25 mg total) by mouth in the morning and at bedtime. 01/13/20   Jennye Boroughs, MD  insulin lispro (HUMALOG KWIKPEN) 100 UNIT/ML KwikPen Inject 0.03 mLs (3 Units total) into the skin 3 (three) times daily with meals. 01/04/20   Enzo Bi, MD  iron polysaccharides (NIFEREX) 150 MG capsule Take 1 capsule (150 mg total) by mouth daily. Patient not taking: Reported on 04/18/2020 01/05/20 04/08/20  Enzo Bi, MD  LANTUS SOLOSTAR 100 UNIT/ML Solostar Pen Inject 4 Units into the skin daily. 04/11/20   Nita Sells, MD  lidocaine-prilocaine (EMLA) cream Apply 1 application topically as directed. 02/20/20   [provider]  Melatonin 5 MG CAPS Take 5 mg by mouth at bedtime.  05/23/19   [provider]  mirtazapine (REMERON) 7.5 MG tablet Take 7.5 mg by mouth at bedtime.  02/25/20   [provider]  multivitamin (RENA-VIT)  TABS tablet Take 1 tablet by mouth daily.    [provider]  Nutritional Supplements (FEEDING SUPPLEMENT, NEPRO CARB STEADY,) LIQD Take 237 mLs by mouth 3 (three) times daily between meals. Patient not taking: Reported on 03/07/2020 07/08/19   Nicole Kindred A, DO  sertraline (ZOLOFT) 100 MG tablet Take 1.5 tablets (150 mg total) by mouth daily. 04/11/20   Nita Sells, MD  thiamine (VITAMIN B-1) 100 MG tablet Take 100 mg by mouth daily. Patient not taking: Reported on 03/07/2020     [provider]    Allergies Ferrous gluconate and Other  Family History  Problem Relation Age of Onset  . CAD Brother   . Dementia Mother   . Renal Disease Father     Social History Social History   Tobacco Use  . Smoking status: Current Every Day Smoker    Packs/day: 0.25    Years: 5.00    Pack years: 1.25    Types: Cigars  . Smokeless tobacco: Never Used  Vaping Use  . Vaping Use: Never used  Substance Use Topics  . Alcohol use: Yes    Alcohol/week: 16.0 standard drinks    Types: 14 Cans of beer, 2 Shots of liquor per week  . Drug use: No    Review of Systems Unable to assess  ____________________________________________   PHYSICAL EXAM:  VITAL SIGNS: ED Triage Vitals  Enc Vitals Group     BP 04/20/20 0656 (!) 182/114     Pulse Rate 04/20/20 0656 85     Resp --      Temp 04/20/20 0656 (!) 90.6 F (32.6 C)     Temp Source 04/20/20 0656 Rectal     SpO2 04/20/20 0656 94 %     Weight --      Height --      Head Circumference --      Peak Flow --      Pain Score 04/20/20 0700 0     Pain Loc --      Pain Edu? --      Excl. in [place]? --    Constitutional: Cachectic and in mild respiratory distress. Eyes: Conjunctivae are injected. PERRL. Head: Atraumatic. Nose: No congestion/rhinnorhea. Mouth/Throat: Mucous membranes are moist. Neck: No stridor Cardiovascular: Grossly normal heart sounds.  Good peripheral circulation. Respiratory: Normal respiratory effort.  No retractions. Gastrointestinal: Soft and nontender. No distention. Musculoskeletal: No obvious deformities Neurologic: Somnolent. Slurred speech. Moving all extremities spontaneously Skin:  Skin is warm and dry. No rash noted. Psychiatric: Affect is normal. Speech is slurred. Behavior is appropriate  ____________________________________________   LABS (all labs ordered are listed, but only abnormal results are displayed)  Labs Reviewed  LACTIC ACID, PLASMA - Abnormal; Notable for  the following components:      Result Value   Lactic Acid, Venous 2.3 (*)    All other components within normal limits  COMPREHENSIVE METABOLIC PANEL - Abnormal; Notable for the following components:   Glucose, Bld 47 (*)    BUN 30 (*)    Creatinine, Ser 3.59 (*)    Calcium 8.5 (*)    Albumin 2.8 (*)    Alkaline Phosphatase 348 (*)    GFR, Estimated 19 (*)    All other components within normal limits  CBC WITH DIFFERENTIAL/PLATELET - Abnormal; Notable for the following components:   RBC 2.81 (*)    Hemoglobin 8.0 (*)    HCT 26.9 (*)    MCHC 29.7 (*)    RDW 21.4 (*)  All other components within normal limits  PROTIME-INR - Abnormal; Notable for the following components:   INR 1.3 (*)    All other components within normal limits  APTT - Abnormal; Notable for the following components:   aPTT 40 (*)    All other components within normal limits  BRAIN NATRIURETIC PEPTIDE - Abnormal; Notable for the following components:   B Natriuretic Peptide >4,500.0 (*)    All other components within normal limits  CBG MONITORING, ED - Abnormal; Notable for the following components:   Glucose-Capillary 129 (*)    All other components within normal limits  RESP PANEL BY RT-PCR (FLU A&B, COVID) ARPGX2  URINE CULTURE  CULTURE, BLOOD (ROUTINE X 2)  CULTURE, BLOOD (ROUTINE X 2)  LACTIC ACID, PLASMA  URINALYSIS, COMPLETE (UACMP) WITH MICROSCOPIC  TROPONIN I (HIGH SENSITIVITY)  TROPONIN I (HIGH SENSITIVITY)   ____________________________________________  EKG  ED ECG REPORT I, Naaman Plummer, the attending physician, personally viewed and interpreted this ECG.  Date: 04/20/2020 EKG Time: 0716 Rate: 80 Rhythm: normal sinus rhythm QRS Axis: normal Intervals: normal ST/T Wave abnormalities: normal Narrative Interpretation: no evidence of acute ischemia  ____________________________________________  RADIOLOGY  ED MD interpretation: One-view portable x-ray of the chest shows a right  lung infiltrate with background of interstitial edema throughout all lung fields.  No evidence of pneumothorax or widened mediastinum  Official radiology report(s): DG Chest Port 1 View  Result Date: 04/20/2020 CLINICAL DATA:  Sepsis. History of end-stage renal disease on hemodialysis. EXAM: PORTABLE CHEST 1 VIEW COMPARISON:  Chest x-ray 04/08/2020 FINDINGS: The heart is enlarged but appears stable. Stable tortuosity and calcification of the thoracic aorta. Asymmetric interstitial and airspace process in the right lung worrisome for pneumonia. Underlying interstitial edema is noted. Small left pleural effusion. IMPRESSION: Right lung infiltrate with background of interstitial edema. Small left pleural effusion. Electronically Signed   By: Marijo Sanes M.D.   On: 04/20/2020 07:42    ____________________________________________   PROCEDURES  Procedure(s) performed (including Critical Care):  .Critical Care Performed by: Naaman Plummer, MD Authorized by: Naaman Plummer, MD   Critical care provider statement:    Critical care time (minutes):  10   Critical care time was exclusive of:  Separately billable procedures and treating other patients   Critical care was necessary to treat or prevent imminent or life-threatening deterioration of the following conditions:  Respiratory failure and sepsis   Critical care was time spent personally by me on the following activities:  Discussions with consultants, evaluation of patient's response to treatment, examination of patient, ordering and performing treatments and interventions, ordering and review of laboratory studies, ordering and review of radiographic studies, pulse oximetry, re-evaluation of patient's condition, obtaining history from patient or surrogate and review of old charts   I assumed direction of critical care for this patient from another provider in my specialty: no   .1-3 Lead EKG Interpretation Performed by: Naaman Plummer,  MD Authorized by: Naaman Plummer, MD     Interpretation: normal     ECG rate:  85   ECG rate assessment: normal     Rhythm: sinus rhythm     Ectopy: none     Conduction: normal       ____________________________________________   INITIAL IMPRESSION / ASSESSMENT AND PLAN / ED COURSE  As part of my medical decision making, I reviewed the following data within the [facility] notes reviewed and incorporated, Labs reviewed, EKG interpreted, Old  chart reviewed, Radiograph reviewed and Notes from prior ED visits reviewed and incorporated        Patient is a 59 year old male with past medical history as described above who presents via EMS for hypoxia and hypoglycemia.  Differential diagnosis includes but is not limited to: Sepsis, pneumonia, medication nonadherence, or upper respiratory infection  Patient's hypoglycemia improved with the D50 injection and remained stable throughout hospital course.  Patient was found to be significantly hypothermic and placed on a Bair hugger.  Patient empirically treated for pneumonia with broad-spectrum antibiotics as well as blood cultures.  Given that patient has increased oxygen requirement from his baseline, he will require admission to the internal medicine service for further evaluation and management. Disposition: Admit      ____________________________________________   FINAL CLINICAL IMPRESSION(S) / ED DIAGNOSES  Final diagnoses:  None     ED Discharge Orders    None       Note:  This document was prepared using Dragon voice recognition software and may include unintentional dictation errors.   Naaman Plummer, MD 04/20/20 [phone]

## 2020-04-20 NOTE — ED Notes (Signed)
Date and time results received: 04/20/20 0852  Test: lactic acid Critical Value: 2.3  Name of Provider Notified: Cheri Fowler, MD  Orders Received? Or Actions Taken?: Orders Received - See Orders for details

## 2020-04-20 NOTE — ED Notes (Signed)
Patient noted to be dropping oxygen saturation levels to 56% on 15L non-rebreather. EDP and Admission MD made aware.  EDP ordered bipap.

## 2020-04-20 NOTE — Progress Notes (Signed)
Pharmacy Antibiotic Note  [name] is a 59 y.o. male admitted on 04/20/2020 with Aspiration Pneumonia.  Pharmacy has been consulted for Unasyn dosing.  Plan: Unasyn 3g IV q12 hours  Height: 5\' 11"  (180.3 cm) Weight: 70.3 kg (155 lb) IBW/kg (Calculated) : 75.3  Temp (24hrs), Avg:90.6 F (32.6 C), Min:90.6 F (32.6 C), Max:90.6 F (32.6 C)  Recent Labs  Lab 04/20/20 0729 04/20/20 0815  WBC 5.7  --   CREATININE 3.59*  --   LATICACIDVEN 1.8 2.3*    Estimated Creatinine Clearance: 21.8 mL/min (A) (by C-G formula based on SCr of 3.59 mg/dL (H)).    Allergies  Allergen Reactions  . Ferrous Gluconate Nausea And Vomiting  . Other     Antimicrobials this admission: Vancomycin/Cefepime 12/8 x 1 Unasyn 12/8 >>   Dose adjustments this admission:  Microbiology results:  Thank you for allowing pharmacy to be a part of this patient's care.  Paulina Fusi, PharmD, BCPS 04/20/2020 10:35 AM

## 2020-04-20 NOTE — ED Triage Notes (Signed)
Pt arrives from home via [place] ems after sister found pt in his room unresponsive with agonal breathing- pt with history of hypoglycemia -- 1mg  Glucagon IM given enroute by ems -- BGL 35 immediately after arrival BGL 46-- 18G L EJ placed immediately upon arrival with amp D50 subsequently given -- eye opening noted shortly after D50 given.  (note- per ems pt sister gave a few compressions on scene prior to first responders arrival) -- 77% on RA on scene with improvement to 91% on 10L via NRB.

## 2020-04-20 NOTE — ED Notes (Signed)
Lab at bedside

## 2020-04-20 NOTE — ED Notes (Signed)
Pt assisted onto a bed pan. Pt tolerated this well.

## 2020-04-20 NOTE — Progress Notes (Addendum)
Inpatient Diabetes Program Recommendations  AACE/ADA: New Consensus Statement on Inpatient Glycemic Control (2015)  Target Ranges:  Prepandial:   less than 140 mg/dL      Peak postprandial:   less than 180 mg/dL (1-2 hours)      Critically ill patients:  140 - 180 mg/dL   Lab Results  Component Value Date   GLUCAP 129 (H) 04/20/2020   HGBA1C 6.5 (H) 04/09/2020    Review of Glycemic Control Results for HULBERT, BRANSCOME (MRN [id number]) as of 04/20/2020 11:24  Ref. Range 04/20/2020 07:29  Glucose Latest Ref Range: 70 - 99 mg/dL 47 (L)  Results for TRAYTON, SZABO (MRN [id number]) as of 04/20/2020 11:24  Ref. Range 04/20/2020 07:21  Glucose-Capillary Latest Ref Range: 70 - 99 mg/dL 129 (H)   Diabetes history: DM  Outpatient Diabetes medications: Humalog 3 units tid with meals,Lantus 4 units daily  Current orders for Inpatient glycemic control:  None/D10 at 30 cc/hr  Inpatient Diabetes Program Recommendations:    Note blood sugars difficult to control.  Needs more liberal glucose goals and 200's are likely ok? Once insulin restarted, consider Lantus 3 units daily? May need to be careful with rapid acting insulin and anticipate that 1 unit will drop blood sugar approxiately 100 mg/dL.  Also may need different insulin doses on dialysis days than on non-dialysis days.  Will watch and will attempt to speak to patients wife on 12/9.   Thanks  Adah Perl, RN, BC-ADM Inpatient Diabetes Coordinator Pager [phone] (8a-5p)

## 2020-04-20 NOTE — Progress Notes (Signed)
We will go ahead and plan to perform HD today

## 2020-04-20 NOTE — Consult Note (Signed)
PHARMACY -  BRIEF ANTIBIOTIC NOTE   Pharmacy has received consult(s) for Cefepime/Vancomycin from an ED provider.  The patient's profile has been reviewed for ht/wt/allergies/indication/available labs.    One time order(s) placed for Cefepime 2g IV x 1 and Vancomycin 1000mg  IV x 1  Will order an additional 500mg  Vancomycin to complete a 1500mg  loading dose  Further antibiotics/pharmacy consults should be ordered by admitting physician if indicated.                       Thank you,  Lu Duffel, PharmD, BCPS Clinical Pharmacist 04/20/2020 7:23 AM

## 2020-04-20 NOTE — H&P (Addendum)
History and Physical    [name]:[id number] DOB: [birth year] DOA: 04/20/2020  PCP: [name], MD   Patient coming from: Home  I have personally briefly reviewed patient's old medical records in [place]  Chief Complaint: Change in mental status Most of the history was obtained from his wife over the phone   HPI: [name] is a 59 y.o. male with medical history significant for end-stage renal disease on hemodialysis (dialysis days are T/TH/S), insulin-dependent diabetes mellitus, hypertension, CVA, GERD who was brought into the ER by EMS for evaluation of hypoglycemia. Patient's wife states that after supper last night his blood sugar was 138 and so she offered him a bedtime snack and he went to bed.  She did not administer any insulin to him last night.  She found him unresponsive this morning prior to leaving the house with agonal breathing he checked his blood sugar and it was 31 and so she called EMS because she was unable to give him any sugar replacement orally.  When EMS arrived patient was noted to have a blood sugar of 35 and they administered 1 mg of glucagon IM.  Upon arrival to the ER he was noted to have blood sugar of 46 and received 1 amp of D50. Per EMS patient's wife had initiated chest compressions prior to their arrival and he had room air pulse oximetry of 77% which improved to 91% on 10 L of oxygen via nonrebreather mask. Patient is lethargic but arouses to verbal stimuli and is able to tell me he is in the hospital and can also tell me the day of the week but is unable to provide any further history. Labs show sodium 136, potassium 4.9, chloride 100, bicarb 27, glucose 47, BUN 30, creatinine 2.59, calcium 8.5, alkaline phosphatase 248, albumin 2.8, AST 39, ALT 41, total protein 6.6, BNP 4500, troponin XII, white count 5.7, hemoglobin 8.0, hematocrit 26.9, MCV 95.7, RDW 21.5, platelet count 246, lactic acid 1.8 >> 2.3, PT 15.2, INR 1.3 Respiratory  viral panel is negative Chest x-ray reviewed by me shows right lung infiltrate with background of interstitial edema.  Small left pleural effusion. Twelve-lead EKG reviewed by me shows normal sinus rhythm   ED Course: Patient is a 59 year old African-American male who presents via EMS for evaluation of hypoxia and hypoglycemia.  Patient was recently discharged from the hospital following hospitalization for recurrent hypoglycemia.  He is currently hypothermic on a [employer] and is on a BiPAP for acute respiratory failure.  He will be admitted for further evaluation and treatment.  Review of Systems: As per HPI otherwise 10 point review of systems negative.    Past Medical History:  Diagnosis Date  . Chronic kidney disease   . Diabetes mellitus without complication ([place])   . ETOH abuse   . GERD (gastroesophageal reflux disease)   . Hyperlipidemia   . Hypertension   . Stroke [facility])     Past Surgical History:  Procedure Laterality Date  . AMPUTATION TOE Left 12/11/2019   Procedure: AMPUTATION LEFT 2nd TOE;  Surgeon: Sharlotte Alamo, DPM;  Location: [facility] ORS;  Service: Podiatry;  Laterality: Left;  . AV FISTULA PLACEMENT Right 11/25/2019   Procedure: INSERTION OF ARTERIOVENOUS (AV) GORE-TEX GRAFT ARM;  Surgeon: Katha Cabal, MD;  Location: [facility] ORS;  Service: Vascular;  Laterality: Right;  . COLONOSCOPY WITH PROPOFOL N/A 03/06/2019   Procedure: COLONOSCOPY WITH PROPOFOL;  Surgeon: Lucilla Lame, MD;  Location: [facility] ENDOSCOPY;  Service:  Endoscopy;  Laterality: N/A;  . DIALYSIS/PERMA CATHETER INSERTION N/A 04/10/2019   Procedure: DIALYSIS/PERMA CATHETER INSERTION;  Surgeon: Serafina Mitchell, MD;  Location: [place] CV LAB;  Service: Cardiovascular;  Laterality: N/A;  . DIALYSIS/PERMA CATHETER REMOVAL N/A 03/07/2020   Procedure: DIALYSIS/PERMA CATHETER REMOVAL;  Surgeon: Algernon Huxley, MD;  Location: [place] CV LAB;  Service: Cardiovascular;  Laterality: N/A;  .  ESOPHAGOGASTRODUODENOSCOPY (EGD) WITH PROPOFOL N/A 07/01/2016   Procedure: ESOPHAGOGASTRODUODENOSCOPY (EGD) WITH PROPOFOL;  Surgeon: San Jetty, MD;  Location: [facility] ENDOSCOPY;  Service: General;  Laterality: N/A;  . ESOPHAGOGASTRODUODENOSCOPY (EGD) WITH PROPOFOL N/A 12/25/2019   Procedure: ESOPHAGOGASTRODUODENOSCOPY (EGD) WITH PROPOFOL;  Surgeon: Lin Landsman, MD;  Location: [facility] ENDOSCOPY;  Service: Gastroenterology;  Laterality: N/A;  . LOWER EXTREMITY ANGIOGRAPHY Left 12/10/2019   Procedure: Lower Extremity Angiography;  Surgeon: Algernon Huxley, MD;  Location: [place] CV LAB;  Service: Cardiovascular;  Laterality: Left;     reports that he has been smoking cigars. He has a 1.25 pack-year smoking history. He has never used smokeless tobacco. He reports current alcohol use of about 16.0 standard drinks of alcohol per week. He reports that he does not use drugs.  Allergies  Allergen Reactions  . Ferrous Gluconate Nausea And Vomiting  . Other     Family History  Problem Relation Age of Onset  . CAD Brother   . Dementia Mother   . Renal Disease Father      Prior to Admission medications   Medication Sig Start Date End Date Taking? Authorizing Provider  aspirin EC 81 MG EC tablet Take 1 tablet (81 mg total) by mouth daily. 06/10/19   Jennye Boroughs, MD  carvedilol (COREG) 6.25 MG tablet Take 6.25 mg by mouth 2 (two) times daily with a meal.  10/28/19   [provider]  ENTRESTO 24-26 MG Take 1 tablet by mouth 2 (two) times daily. 07/29/19   [provider]  epoetin alfa (EPOGEN) 10000 UNIT/ML injection Inject 0.4 mLs (4,000 Units total) into the vein Every Tuesday,Thursday,and Saturday with dialysis. 01/05/20   Enzo Bi, MD  folic acid (FOLVITE) 1 MG tablet Take 1 mg by mouth daily. 03/12/19   [provider]  hydrALAZINE (APRESOLINE) 25 MG tablet Take 1 tablet (25 mg total) by mouth in the morning and at bedtime. 01/13/20   Jennye Boroughs, MD  insulin  lispro (HUMALOG KWIKPEN) 100 UNIT/ML KwikPen Inject 0.03 mLs (3 Units total) into the skin 3 (three) times daily with meals. 01/04/20   Enzo Bi, MD  iron polysaccharides (NIFEREX) 150 MG capsule Take 1 capsule (150 mg total) by mouth daily. Patient not taking: Reported on 04/18/2020 01/05/20 04/08/20  Enzo Bi, MD  LANTUS SOLOSTAR 100 UNIT/ML Solostar Pen Inject 4 Units into the skin daily. 04/11/20   Nita Sells, MD  lidocaine-prilocaine (EMLA) cream Apply 1 application topically as directed. 02/20/20   [provider]  Melatonin 5 MG CAPS Take 5 mg by mouth at bedtime.  05/23/19   [provider]  mirtazapine (REMERON) 7.5 MG tablet Take 7.5 mg by mouth at bedtime.  02/25/20   [provider]  multivitamin (RENA-VIT) TABS tablet Take 1 tablet by mouth daily.    [provider]  Nutritional Supplements (FEEDING SUPPLEMENT, NEPRO CARB STEADY,) LIQD Take 237 mLs by mouth 3 (three) times daily between meals. Patient not taking: Reported on 03/07/2020 07/08/19   Nicole Kindred A, DO  sertraline (ZOLOFT) 100 MG tablet Take 1.5 tablets (150 mg total)  by mouth daily. 04/11/20   Nita Sells, MD  thiamine (VITAMIN B-1) 100 MG tablet Take 100 mg by mouth daily. Patient not taking: Reported on 03/07/2020    [provider]    Physical Exam: Vitals:   04/20/20 0900 04/20/20 0915 04/20/20 0930 04/20/20 0945  BP: 127/86 134/78 135/76 118/68  Pulse: 78 86 84 85  Resp: 19 (!) 23 (!) 21 (!) 21  Temp:      TempSrc:      SpO2: 96%   100%  Weight:      Height:         Vitals:   04/20/20 0900 04/20/20 0915 04/20/20 0930 04/20/20 0945  BP: 127/86 134/78 135/76 118/68  Pulse: 78 86 84 85  Resp: 19 (!) 23 (!) 21 (!) 21  Temp:      TempSrc:      SpO2: 96%   100%  Weight:      Height:        Constitutional: NAD, lethargic but arouses to verbal stimuli.  Knows he is in the hospital on the day Eyes: PERRL, lids and conjunctivae pallor ENMT:  Mucous membranes are moist.  Neck: normal, supple, no masses, no thyromegaly Respiratory: Rhonchi right base, faint crackles at the bases. Normal respiratory effort. No accessory muscle use.  Cardiovascular: Regular rate and rhythm,no murmurs / rubs / gallops. No extremity edema. 2+ pedal pulses. No carotid bruits.  Abdomen: no tenderness, no masses palpated. No hepatosplenomegaly. Bowel sounds positive.  Musculoskeletal: no clubbing / cyanosis. No joint deformity upper and lower extremities.  Skin: no rashes, lesions, ulcers.  Neurologic: No gross focal neurologic deficit.  Generalized weakness Psychiatric: Flat affect.   Labs on Admission: I have personally reviewed following labs and imaging studies  CBC: Recent Labs  Lab 04/20/20 0729  WBC 5.7  NEUTROABS 3.7  HGB 8.0*  HCT 26.9*  MCV 95.7  PLT 387   Basic Metabolic Panel: Recent Labs  Lab 04/20/20 0729  NA 136  K 4.9  CL 100  CO2 27  GLUCOSE 47*  BUN 30*  CREATININE 3.59*  CALCIUM 8.5*   GFR: Estimated Creatinine Clearance: 21.8 mL/min (A) (by C-G formula based on SCr of 3.59 mg/dL (H)). Liver Function Tests: Recent Labs  Lab 04/20/20 0729  AST 39  ALT 41  ALKPHOS 348*  BILITOT 0.5  PROT 6.6  ALBUMIN 2.8*   No results for input(s): LIPASE, AMYLASE in the last 168 hours. No results for input(s): AMMONIA in the last 168 hours. Coagulation Profile: Recent Labs  Lab 04/20/20 0729  INR 1.3*   Cardiac Enzymes: No results for input(s): CKTOTAL, CKMB, CKMBINDEX, TROPONINI in the last 168 hours. BNP (last 3 results) No results for input(s): PROBNP in the last 8760 hours. HbA1C: No results for input(s): HGBA1C in the last 72 hours. CBG: Recent Labs  Lab 04/20/20 0721  GLUCAP 129*   Lipid Profile: No results for input(s): CHOL, HDL, LDLCALC, TRIG, CHOLHDL, LDLDIRECT in the last 72 hours. Thyroid Function Tests: No results for input(s): TSH, T4TOTAL, FREET4, T3FREE, THYROIDAB in the last 72  hours. Anemia Panel: No results for input(s): VITAMINB12, FOLATE, FERRITIN, TIBC, IRON, RETICCTPCT in the last 72 hours. Urine analysis:    Component Value Date/Time   COLORURINE YELLOW (A) 04/10/2020 0330   APPEARANCEUR HAZY (A) 04/10/2020 0330   APPEARANCEUR Clear 01/17/2014 1551   LABSPEC 1.017 04/10/2020 0330   LABSPEC 1.011 01/17/2014 1551   PHURINE 7.0 04/10/2020 0330   GLUCOSEU >=500 (A)  04/10/2020 0330   GLUCOSEU Negative 01/17/2014 1551   HGBUR NEGATIVE 04/10/2020 0330   BILIRUBINUR NEGATIVE 04/10/2020 0330   BILIRUBINUR Negative 01/17/2014 1551   KETONESUR NEGATIVE 04/10/2020 0330   PROTEINUR >=300 (A) 04/10/2020 0330   NITRITE NEGATIVE 04/10/2020 0330   LEUKOCYTESUR NEGATIVE 04/10/2020 0330   LEUKOCYTESUR 1+ 01/17/2014 1551    Radiological Exams on Admission: DG Chest Port 1 View  Result Date: 04/20/2020 CLINICAL DATA:  Sepsis. History of end-stage renal disease on hemodialysis. EXAM: PORTABLE CHEST 1 VIEW COMPARISON:  Chest x-ray 04/08/2020 FINDINGS: The heart is enlarged but appears stable. Stable tortuosity and calcification of the thoracic aorta. Asymmetric interstitial and airspace process in the right lung worrisome for pneumonia. Underlying interstitial edema is noted. Small left pleural effusion. IMPRESSION: Right lung infiltrate with background of interstitial edema. Small left pleural effusion. Electronically Signed   By: Marijo Sanes M.D.   On: 04/20/2020 07:42    EKG: Independently reviewed.  Normal sinus rhythm  Assessment/Plan Principal Problem:   Acute metabolic encephalopathy Active Problems:   ESRD on dialysis (HCC)   Depression   DM (diabetes mellitus), type 2 with complications (HCC)   Aspiration pneumonia ([place])   Acute respiratory failure ([place])    Acute metabolic encephalopathy Patient was found unresponsive with agonal breathing by his wife Most likely secondary to hypoglycemia Patient had a blood sugar of 31 per EMS and upon arrival to  the ER received an amp of D50 with transient improvement in his blood sugars Will start patient on D10 W Blood sugar checks every 2 hours Keep patient n.p.o. for now    Diabetes mellitus with complications of end-stage renal disease and hypoglycemia Patient's dialysis days are Tuesday/Thursday/Saturday We will request nephrology consult for renal replacement therapy during this admission Patient has frequent hypoglycemic episodes and was recently hospitalized for same.  His last hemoglobin A1c is 6.5. He will require adjustment in his insulin dose upon discharge    Acute respiratory failure Patient was hypoxic in the field with room air pulse oximetry of 77% and was placed on a nonrebreather mask with improvement in his pulse oximetry He is tachypneic and has increased work of breathing and is currently on a BiPAP to reduce work of breathing Acute respiratory failure appears to be multifactorial and may be related to pulmonary edema as well as aspiration pneumonia We will attempt to wean patient off BiPAP as tolerated    Aspiration pneumonia Patient noted to have right lower lobe infiltrate and was hypoxic upon arrival to the ER requiring BiPAP therapy Keep patient n.p.o. for now We will request swallow function evaluation Start patient on Unasyn adjusted to renal function     Anemia of end-stage renal disease H&H is stable Continue iron supplementation   Depression Continue Remeron and Zoloft once patient is able to take p.o.    Chronic systolic heart failure Last known LVEF of 45 - 50% Continue Entresto, carvedilol and hydralazine    SIRS due to infectious process with acute organ dysfunction Patient has elevated lactic acid levels but does not meet sepsis criteria at this time He is hypothermic and is on a [employer], he is tachypneic but has a normal white cell count with differential. Chest x-ray shows a right lower lobe infiltrate consistent with  aspiration We will order procalcitonin    DVT prophylaxis: Heparin Code Status: Full code Family Communication: Greater than 50% of time was spent discussing patient's condition and plan of care with his wife [name] well  over the phone.  All questions and concerns have been addressed.  She verbalizes understanding and agrees with the plan. Disposition Plan: Back to previous home environment Consults called: Nephrology    Collier Bullock MD [facility]     04/20/2020, 9:56 AM

## 2020-04-20 NOTE — Progress Notes (Signed)
[facility]  ROUNDING NOTE   Subjective:  Patient comes back in now with significant shortness of breath. He is currently on BiPAP. Found to have hypoglycemia at home. He was found to have agonal type breathing at home and blood sugar was checked and it came back at 31. Chest x-ray showed right lung infiltrate.  Objective:  Vital signs in last 24 hours:  Temp:  [90.6 F (32.6 C)] 90.6 F (32.6 C) (12/08 0656) Pulse Rate:  [53-97] 79 (12/08 1300) Resp:  [16-23] 22 (12/08 1300) BP: (93-182)/(60-114) 103/64 (12/08 1300) SpO2:  [90 %-100 %] 100 % (12/08 1300) Weight:  [70.3 kg] 70.3 kg (12/08 0703)  Weight change:  Filed Weights   04/20/20 0703  Weight: 70.3 kg    Intake/Output: No intake/output data recorded.   Intake/Output this shift:  Total I/O In: 2100.4 [I.V.:1000; IV Piggyback:1100.4] Out: -   Physical Exam: General:  Critically ill-appearing  Head: .BiPAP facemask on  Eyes:  Sclerae and conjunctivae clear  Lungs:   Increased work of breathing, bilateral rhonchi and rales  Heart:  S1-S2, no rubs or gallops  Abdomen:  Soft, nontender, nondistended  Extremities:  Trace lower extremity edema  Neurologic:  Arousable but not following commands  Skin:  No acute lesions or rashes  Access:  Right AVF +bruit,+thrill    Basic Metabolic Panel: Recent Labs  Lab 04/20/20 0729  NA 136  K 4.9  CL 100  CO2 27  GLUCOSE 47*  BUN 30*  CREATININE 3.59*  CALCIUM 8.5*    Liver Function Tests: Recent Labs  Lab 04/20/20 0729  AST 39  ALT 41  ALKPHOS 348*  BILITOT 0.5  PROT 6.6  ALBUMIN 2.8*   No results for input(s): LIPASE, AMYLASE in the last 168 hours. No results for input(s): AMMONIA in the last 168 hours.  CBC: Recent Labs  Lab 04/20/20 0729  WBC 5.7  NEUTROABS 3.7  HGB 8.0*  HCT 26.9*  MCV 95.7  PLT 246    Cardiac Enzymes: No results for input(s): CKTOTAL, CKMB, CKMBINDEX, TROPONINI in the last 168 hours.  BNP: Invalid  input(s): POCBNP  CBG: Recent Labs  Lab 04/20/20 0721 04/20/20 0945 04/20/20 1151 04/20/20 1322  GLUCAP 129* 59* 57* 89    Microbiology: Results for orders placed or performed during the hospital encounter of 04/20/20  Resp Panel by RT-PCR (Flu A&B, Covid) Nasopharyngeal Swab     Status: None   Collection Time: 04/20/20  7:30 AM   Specimen: Nasopharyngeal Swab; Nasopharyngeal(NP) swabs in vial transport medium  Result Value Ref Range Status   SARS Coronavirus 2 by RT PCR NEGATIVE NEGATIVE Final    Comment: (NOTE) SARS-CoV-2 target nucleic acids are NOT DETECTED.  The SARS-CoV-2 RNA is generally detectable in upper respiratory specimens during the acute phase of infection. The lowest concentration of SARS-CoV-2 viral copies this assay can detect is 138 copies/mL. A negative result does not preclude SARS-Cov-2 infection and should not be used as the sole basis for treatment or other patient management decisions. A negative result may occur with  improper specimen collection/handling, submission of specimen other than nasopharyngeal swab, presence of viral mutation(s) within the areas targeted by this assay, and inadequate number of viral copies(<138 copies/mL). A negative result must be combined with clinical observations, patient history, and epidemiological information. The expected result is Negative.  Fact Sheet for Patients:  EntrepreneurPulse.com.au  Fact Sheet for Healthcare Providers:  IncredibleEmployment.be  This test is no t yet approved or cleared  by the [place] and  has been authorized for detection and/or diagnosis of SARS-CoV-2 by FDA under an Emergency Use Authorization (EUA). This EUA will remain  in effect (meaning this test can be used) for the duration of the COVID-19 declaration under Section 564(b)(1) of the Act, 21 U.S.C.section 360bbb-3(b)(1), unless the authorization is terminated  or revoked sooner.        Influenza A by PCR NEGATIVE NEGATIVE Final   Influenza B by PCR NEGATIVE NEGATIVE Final    Comment: (NOTE) The Xpert Xpress SARS-CoV-2/FLU/RSV plus assay is intended as an aid in the diagnosis of influenza from Nasopharyngeal swab specimens and should not be used as a sole basis for treatment. Nasal washings and aspirates are unacceptable for Xpert Xpress SARS-CoV-2/FLU/RSV testing.  Fact Sheet for Patients: EntrepreneurPulse.com.au  Fact Sheet for Healthcare Providers: IncredibleEmployment.be  This test is not yet approved or cleared by the [place] FDA and has been authorized for detection and/or diagnosis of SARS-CoV-2 by FDA under an Emergency Use Authorization (EUA). This EUA will remain in effect (meaning this test can be used) for the duration of the COVID-19 declaration under Section 564(b)(1) of the Act, 21 U.S.C. section 360bbb-3(b)(1), unless the authorization is terminated or revoked.  Performed at [facility], [address] [place], [place] [address]     Coagulation Studies: Recent Labs    04/20/20 0729  LABPROT 15.2  INR 1.3*    Urinalysis: No results for input(s): COLORURINE, LABSPEC, PHURINE, GLUCOSEU, HGBUR, BILIRUBINUR, KETONESUR, PROTEINUR, UROBILINOGEN, NITRITE, LEUKOCYTESUR in the last 72 hours.  Invalid input(s): APPERANCEUR    Imaging: DG Chest Port 1 View  Result Date: 04/20/2020 CLINICAL DATA:  Sepsis. History of end-stage renal disease on hemodialysis. EXAM: PORTABLE CHEST 1 VIEW COMPARISON:  Chest x-ray 04/08/2020 FINDINGS: The heart is enlarged but appears stable. Stable tortuosity and calcification of the thoracic aorta. Asymmetric interstitial and airspace process in the right lung worrisome for pneumonia. Underlying interstitial edema is noted. Small left pleural effusion. IMPRESSION: Right lung infiltrate with background of interstitial edema. Small left pleural effusion.  Electronically Signed   By: Marijo Sanes M.D.   On: 04/20/2020 07:42     Medications:   . ampicillin-sulbactam (UNASYN) IV Stopped (04/20/20 1231)  . dextrose 30 mL/hr at 04/20/20 1018  . lactated ringers Stopped (04/20/20 0941)   . aspirin EC  81 mg Oral Daily  . carvedilol  6.25 mg Oral BID WC  . [START ON 04/21/2020] epoetin alfa  4,000 Units Intravenous Q T,Th,Sa-HD  . folic acid  1 mg Oral Daily  . heparin  5,000 Units Subcutaneous Q8H  . hydrALAZINE  25 mg Oral BID  . melatonin  5 mg Oral QHS  . mirtazapine  7.5 mg Oral QHS  . multivitamin  1 tablet Oral Daily  . sacubitril-valsartan  1 tablet Oral BID  . sertraline  150 mg Oral Daily   acetaminophen **OR** acetaminophen, lidocaine-prilocaine, ondansetron **OR** ondansetron (ZOFRAN) IV  Assessment/ Plan:  [name] is a 59 y.o. black male with end stage renal disease on hemodialysis, hypertension, diabetes mellitus type II, hyperlipidemia, history of alcohol abuse who was admitted on 04/20/2020 for Acute metabolic encephalopathy [Z76.73] due to hypoglycemia  [facility] TTS [place]. Right AVF 59.5kg  1. End Stage Renal Disease  Patient had dialysis yesterday.  No immediate need for dialysis today.  Reassess for dialysis tomorrow.  2. Anemia with chronic kidney disease Lab Results  Component Value Date  HGB 8.0 (L) 04/20/2020  Continue to monitor serum hemoglobin.  Consider restarting Epogen.   3.  Acute respiratory failure. Appears to be combination of pneumonia and interstitial edema.  Noted to be a bit hypotensive.  Hold off on dialysis for now.  Continue to treat pneumonia with antibiotics.   4. Secondary Hyperparathyroidism Lab Results  Component Value Date   CALCIUM 8.5 (L) 04/20/2020   CAION 1.00 (L) 12/25/2019   PHOS 6.6 (H) 12/26/2019  Continue monitoring bone mineral metabolism parameters   LOS: 0 Teya Otterson 12/8/20211:27 PM

## 2020-04-20 NOTE — ED Notes (Signed)
MD aware of BGL 89, no new orders at this time.

## 2020-04-20 NOTE — ED Notes (Signed)
Unable to obtain cultures at this time due to being unable to get IV access other than established EJ. MD made aware.

## 2020-04-21 DIAGNOSIS — G9341 Metabolic encephalopathy: Secondary | ICD-10-CM

## 2020-04-21 LAB — BASIC METABOLIC PANEL
Anion gap: 9 (ref 5–15)
BUN: 20 mg/dL (ref 6–20)
CO2: 28 mmol/L (ref 22–32)
Calcium: 8.1 mg/dL — ABNORMAL LOW (ref 8.9–10.3)
Chloride: 101 mmol/L (ref 98–111)
Creatinine, Ser: 2.59 mg/dL — ABNORMAL HIGH (ref 0.61–1.24)
GFR, Estimated: 28 mL/min — ABNORMAL LOW (ref >60)
Glucose, Bld: 137 mg/dL — ABNORMAL HIGH (ref 70–99)
Potassium: 3.9 mmol/L (ref 3.5–5.1)
Sodium: 138 mmol/L (ref 135–145)

## 2020-04-21 LAB — CBC
HCT: 21.6 % — ABNORMAL LOW (ref 39.0–52.0)
Hemoglobin: 6.4 g/dL — ABNORMAL LOW (ref 13.0–17.0)
MCH: 27.8 pg (ref 26.0–34.0)
MCHC: 29.6 g/dL — ABNORMAL LOW (ref 30.0–36.0)
MCV: 93.9 fL (ref 80.0–100.0)
Platelets: 157 10*3/uL (ref 150–400)
RBC: 2.3 MIL/uL — ABNORMAL LOW (ref 4.22–5.81)
RDW: 21.7 % — ABNORMAL HIGH (ref 11.5–15.5)
WBC: 7.9 10*3/uL (ref 4.0–10.5)
nRBC: 0 % (ref 0.0–0.2)

## 2020-04-21 LAB — GLUCOSE, CAPILLARY
Glucose-Capillary: 46 mg/dL — ABNORMAL LOW (ref 70–99)
Glucose-Capillary: 139 mg/dL — ABNORMAL HIGH (ref 70–99)
Glucose-Capillary: 132 mg/dL — ABNORMAL HIGH (ref 70–99)
Glucose-Capillary: 330 mg/dL — ABNORMAL HIGH (ref 70–99)
Glucose-Capillary: 254 mg/dL — ABNORMAL HIGH (ref 70–99)

## 2020-04-21 LAB — PREPARE RBC (CROSSMATCH)

## 2020-04-21 LAB — LACTIC ACID, PLASMA
Lactic Acid, Venous: 1.9 mmol/L (ref 0.5–1.9)
Lactic Acid, Venous: 3.9 mmol/L (ref 0.5–1.9)

## 2020-04-21 LAB — HEMOGLOBIN AND HEMATOCRIT, BLOOD
HCT: 27.9 % — ABNORMAL LOW (ref 39.0–52.0)
Hemoglobin: 8.1 g/dL — ABNORMAL LOW (ref 13.0–17.0)

## 2020-04-21 LAB — PHOSPHORUS: Phosphorus: 3.1 mg/dL (ref 2.5–4.6)

## 2020-04-21 MED ORDER — INSULIN ASPART 100 UNIT/ML ~~LOC~~ SOLN
0.0000 [IU] | Freq: Every day | SUBCUTANEOUS | Status: DC
  Administered 2020-04-21: 3 [IU] via SUBCUTANEOUS
  Filled 2020-04-21: qty 1

## 2020-04-21 MED ORDER — INSULIN ASPART 100 UNIT/ML ~~LOC~~ SOLN
0.0000 [IU] | Freq: Three times a day (TID) | SUBCUTANEOUS | Status: DC
  Administered 2020-04-21: 7 [IU] via SUBCUTANEOUS
  Administered 2020-04-22: 5 [IU] via SUBCUTANEOUS
  Administered 2020-04-22: 1 [IU] via SUBCUTANEOUS
  Administered 2020-04-22: 3 [IU] via SUBCUTANEOUS
  Filled 2020-04-21 (×4): qty 1

## 2020-04-21 MED ORDER — ACETAMINOPHEN 325 MG PO TABS: 650.0000 mg | ORAL_TABLET | Freq: Once | ORAL | Status: DC

## 2020-04-21 MED ORDER — SODIUM CHLORIDE 0.9% IV SOLUTION: Freq: Once | INTRAVENOUS | Status: AC

## 2020-04-21 NOTE — Progress Notes (Signed)
[facility]  ROUNDING NOTE   Subjective:  Patient now off of BiPAP. Still having some periods of hypoxia. Underwent dialysis yesterday.  Objective:  Vital signs in last 24 hours:  Temp:  [98 F (36.7 C)-99.6 F (37.6 C)] 98.5 F (36.9 C) (12/09 0400) Pulse Rate:  [56-118] 76 (12/09 0600) Resp:  [8-25] 17 (12/09 0600) BP: (93-156)/(56-89) 109/63 (12/09 0600) SpO2:  [88 %-100 %] 92 % (12/09 0600) FiO2 (%):  [45 %] 45 % (12/08 1915) Weight:  [61.7 kg] 61.7 kg (12/08 1411)  Weight change:  Filed Weights   04/20/20 0703 04/20/20 1411  Weight: 70.3 kg 61.7 kg    Intake/Output: I/O last 3 completed shifts: In: 2453.1 [I.V.:1168.3; IV Piggyback:1284.8] Out: 0    Intake/Output this shift:  No intake/output data recorded.  Physical Exam: General:  Chronically ill-appearing  Head: .Normocephalic atraumatic, hearing intact  Eyes:  Sclerae and conjunctivae clear  Lungs:   Bilateral rales and rhonchi, normal effort  Heart:  S1-S2, no rubs or gallops  Abdomen:  Soft, nontender, nondistended  Extremities:  Trace lower extremity edema  Neurologic:  Awake, alert, following commands  Skin:  No acute lesions or rashes  Access:  Right AVF +bruit,+thrill    Basic Metabolic Panel: Recent Labs  Lab 04/20/20 0729 04/20/20 1822 04/21/20 0640  NA 136  --  138  K 4.9  --  3.9  CL 100  --  101  CO2 27  --  28  GLUCOSE 47*  --  137*  BUN 30*  --  20  CREATININE 3.59*  --  2.59*  CALCIUM 8.5*  --  8.1*  PHOS  --  2.8  --     Liver Function Tests: Recent Labs  Lab 04/20/20 0729  AST 39  ALT 41  ALKPHOS 348*  BILITOT 0.5  PROT 6.6  ALBUMIN 2.8*   No results for input(s): LIPASE, AMYLASE in the last 168 hours. No results for input(s): AMMONIA in the last 168 hours.  CBC: Recent Labs  Lab 04/20/20 0729 04/21/20 0640  WBC 5.7 7.9  NEUTROABS 3.7  --   HGB 8.0* 6.4*  HCT 26.9* 21.6*  MCV 95.7 93.9  PLT 246 157    Cardiac Enzymes: No results for  input(s): CKTOTAL, CKMB, CKMBINDEX, TROPONINI in the last 168 hours.  BNP: Invalid input(s): POCBNP  CBG: Recent Labs  Lab 04/20/20 1531 04/20/20 2027 04/20/20 2336 04/21/20 0346 04/21/20 0805  GLUCAP 95 74 127* 139* 132*    Microbiology: Results for orders placed or performed during the hospital encounter of 04/20/20  Resp Panel by RT-PCR (Flu A&B, Covid) Nasopharyngeal Swab     Status: None   Collection Time: 04/20/20  7:30 AM   Specimen: Nasopharyngeal Swab; Nasopharyngeal(NP) swabs in vial transport medium  Result Value Ref Range Status   SARS Coronavirus 2 by RT PCR NEGATIVE NEGATIVE Final    Comment: (NOTE) SARS-CoV-2 target nucleic acids are NOT DETECTED.  The SARS-CoV-2 RNA is generally detectable in upper respiratory specimens during the acute phase of infection. The lowest concentration of SARS-CoV-2 viral copies this assay can detect is 138 copies/mL. A negative result does not preclude SARS-Cov-2 infection and should not be used as the sole basis for treatment or other patient management decisions. A negative result may occur with  improper specimen collection/handling, submission of specimen other than nasopharyngeal swab, presence of viral mutation(s) within the areas targeted by this assay, and inadequate number of viral copies(<138 copies/mL). A negative result  must be combined with clinical observations, patient history, and epidemiological information. The expected result is Negative.  Fact Sheet for Patients:  EntrepreneurPulse.com.au  Fact Sheet for Healthcare Providers:  IncredibleEmployment.be  This test is no t yet approved or cleared by the [place] FDA and  has been authorized for detection and/or diagnosis of SARS-CoV-2 by FDA under an Emergency Use Authorization (EUA). This EUA will remain  in effect (meaning this test can be used) for the duration of the COVID-19 declaration under Section 564(b)(1) of  the Act, 21 U.S.C.section 360bbb-3(b)(1), unless the authorization is terminated  or revoked sooner.       Influenza A by PCR NEGATIVE NEGATIVE Final   Influenza B by PCR NEGATIVE NEGATIVE Final    Comment: (NOTE) The Xpert Xpress SARS-CoV-2/FLU/RSV plus assay is intended as an aid in the diagnosis of influenza from Nasopharyngeal swab specimens and should not be used as a sole basis for treatment. Nasal washings and aspirates are unacceptable for Xpert Xpress SARS-CoV-2/FLU/RSV testing.  Fact Sheet for Patients: EntrepreneurPulse.com.au  Fact Sheet for Healthcare Providers: IncredibleEmployment.be  This test is not yet approved or cleared by the [place] FDA and has been authorized for detection and/or diagnosis of SARS-CoV-2 by FDA under an Emergency Use Authorization (EUA). This EUA will remain in effect (meaning this test can be used) for the duration of the COVID-19 declaration under Section 564(b)(1) of the Act, 21 U.S.C. section 360bbb-3(b)(1), unless the authorization is terminated or revoked.  Performed at [facility], [address] [place], [place] [address]   Blood Culture (routine x 2)     Status: None (Preliminary result)   Collection Time: 04/20/20  8:14 AM   Specimen: BLOOD  Result Value Ref Range Status   Specimen Description BLOOD LEFT ARM  Final   Special Requests   Final    BOTTLES DRAWN AEROBIC AND ANAEROBIC Blood Culture adequate volume   Culture   Final    NO GROWTH < 24 HOURS Performed at [facility], [address] [place], [place] [address]    Report Status PENDING  Incomplete  Blood Culture (routine x 2)     Status: None (Preliminary result)   Collection Time: 04/20/20  8:15 AM   Specimen: BLOOD  Result Value Ref Range Status   Specimen Description BLOOD LEFT WRIST  Final   Special Requests   Final    BOTTLES DRAWN AEROBIC AND ANAEROBIC Blood Culture adequate volume   Culture    Final    NO GROWTH < 24 HOURS Performed at [facility], [address] [place], [place] [address]    Report Status PENDING  Incomplete  MRSA PCR Screening     Status: None   Collection Time: 04/20/20  2:31 PM   Specimen: Nasal Mucosa; Nasopharyngeal  Result Value Ref Range Status   MRSA by PCR NEGATIVE NEGATIVE Final    Comment:        The GeneXpert MRSA Assay (FDA approved for NASAL specimens only), is one component of a comprehensive MRSA colonization surveillance program. It is not intended to diagnose MRSA infection nor to guide or monitor treatment for MRSA infections. Performed at [facility], [address] [place], [place] [address]     Coagulation Studies: Recent Labs    04/20/20 0729  LABPROT 15.2  INR 1.3*    Urinalysis: No results for input(s): COLORURINE, LABSPEC, PHURINE, GLUCOSEU, HGBUR, BILIRUBINUR, KETONESUR, PROTEINUR, UROBILINOGEN, NITRITE, LEUKOCYTESUR in the last 72 hours.  Invalid  input(s): APPERANCEUR    Imaging: DG Chest Port 1 View  Result Date: 04/20/2020 CLINICAL DATA:  Sepsis. History of end-stage renal disease on hemodialysis. EXAM: PORTABLE CHEST 1 VIEW COMPARISON:  Chest x-ray 04/08/2020 FINDINGS: The heart is enlarged but appears stable. Stable tortuosity and calcification of the thoracic aorta. Asymmetric interstitial and airspace process in the right lung worrisome for pneumonia. Underlying interstitial edema is noted. Small left pleural effusion. IMPRESSION: Right lung infiltrate with background of interstitial edema. Small left pleural effusion. Electronically Signed   By: Marijo Sanes M.D.   On: 04/20/2020 07:42   DG Humerus Right  Result Date: 04/20/2020 CLINICAL DATA:  59 year old male ICU patient status post fall. EXAM: RIGHT HUMERUS - 2+ VIEW COMPARISON:  Portable chest radiograph earlier today. FINDINGS: Bone mineralization is within normal limits. Alignment appears grossly preserved at the right shoulder  and elbow. There is no evidence of fracture or other focal bone lesions. Stable visible right chest. IMPRESSION: No acute fracture or dislocation identified about the right humerus. Electronically Signed   By: Genevie Ann M.D.   On: 04/20/2020 18:38     Medications:   . sodium chloride    . sodium chloride    . ampicillin-sulbactam (UNASYN) IV 3 g (04/20/20 2333)   . aspirin EC  81 mg Oral Daily  . carvedilol  6.25 mg Oral BID WC  . Chlorhexidine Gluconate Cloth  6 each Topical Q0600  . epoetin alfa  4,000 Units Intravenous Q T,Th,Sa-HD  . folic acid  1 mg Oral Daily  . heparin  5,000 Units Subcutaneous Q8H  . hydrALAZINE  25 mg Oral BID  . melatonin  5 mg Oral QHS  . mirtazapine  7.5 mg Oral QHS  . multivitamin  1 tablet Oral Daily  . sacubitril-valsartan  1 tablet Oral BID  . sertraline  150 mg Oral Daily   sodium chloride, sodium chloride, acetaminophen **OR** acetaminophen, alteplase, heparin, HYDROmorphone (DILAUDID) injection, lidocaine (PF), lidocaine-prilocaine, lidocaine-prilocaine, ondansetron **OR** ondansetron (ZOFRAN) IV, pentafluoroprop-tetrafluoroeth  Assessment/ Plan:  [name] is a 59 y.o. black male with end stage renal disease on hemodialysis, hypertension, diabetes mellitus type II, hyperlipidemia, history of alcohol abuse who was admitted on 04/20/2020 for Hypoglycemia [E16.2] Acute and chronic respiratory failure with hypoxia ([place]) [J96.21] Altered mental status, unspecified altered mental status type [Z56.38] Acute metabolic encephalopathy [V56.43] Sepsis, due to unspecified organism, unspecified whether acute organ dysfunction present ([place]) [A41.9] due to hypoglycemia  [facility] T[facility] [place]. Right AVF 59.5kg  1. End Stage Renal Disease  We will plan for hemodialysis again today. Still appears to have some volume off.  2. Anemia with chronic kidney disease Lab Results  Component Value Date   HGB 6.4 (L) 04/21/2020  Hemoglobin down to 6.4.  Recommend blood transfusion but defer to primary team.   3.  Acute respiratory failure. Appears to be combination of pneumonia and interstitial edema.   -Underwent dialysis yesterday for volume removal. We will plan for additional dialysis treatment with volume removal today.   4. Secondary Hyperparathyroidism Lab Results  Component Value Date   CALCIUM 8.1 (L) 04/21/2020   CAION 1.00 (L) 12/25/2019   PHOS 2.8 04/20/2020  Phosphorus at target of 2.8. We will continue to monitor serum phosphorus.   LOS: 1 Cortez Flippen 12/9/20218:21 AM

## 2020-04-21 NOTE — Progress Notes (Signed)
Arrived to set up for  Dialysis  But patient refused. Stated he would not allow me to dialyze him until he eats and threatened to sign out AMA. Floor Nurse advised . Dr. Holley Raring notified will try again after his tray is ordered   Jeanett Schlein, RN

## 2020-04-21 NOTE — Progress Notes (Signed)
Pt consented to Dialysis after eating and finding out he needed blood transfusion before he could leave the ICU.  Pt Treatment started 1015. He will run 3.5 hours on 3 K 2.5 CA bath. Pt has AVG with good bruit and thrill. 15 gauge needles used for cannulation with no issues. BFR of 400 achieved and DFR 800, UF goal of 1.5 net will be attempted for removal  Jeanett Schlein, RN

## 2020-04-21 NOTE — Progress Notes (Signed)
Report called to [place] on [place], pt to transport on 2C bed w/ 6 L NCO2, Pt agreeable to transfer.  Belongings and chart to transfer with him.  VSS on 6L [place]

## 2020-04-21 NOTE — Progress Notes (Addendum)
PROGRESS NOTE    [name]  PTW:[id number] DOB: 10/13/1960 DOA: 04/20/2020 PCP: Reginald Harrier, MD    Brief Narrative:  [name] is a 59 y.o. male with medical history significant forend-stage renal disease on hemodialysis(dialysis days are T/TH/S),insulin-dependent diabetes mellitus, hypertension, CVA, GERD who was brought into the ER by EMS for evaluation of hypoglycemia  12/9-Hg 6.4 this am. Pt awake, eating . BG improved  Consultants:   nephrology  Procedures: HD, transfusion  Antimicrobials:       Subjective:  eating this am. Has no c/o sob, dizziness, lightheadedness.  Objective: Vitals:   04/21/20 1400 04/21/20 1415 04/21/20 1500 04/21/20 1600  BP: 117/72 136/76 122/69 133/72  Pulse: 68 96 69 78  Resp: 18 20 (!) 22 20  Temp:  98.5 F (36.9 C)    TempSrc:      SpO2: (!) 80% (!) 86% 94% 94%  Weight:      Height:        Intake/Output Summary (Last 24 hours) at 04/21/2020 1739 Last data filed at 04/21/2020 1417 Gross per 24 hour  Intake 1931.86 ml  Output 1500 ml  Net 431.86 ml   Filed Weights   04/20/20 0703 04/20/20 1411  Weight: 70.3 kg 61.7 kg    Examination:  General exam: Appears calm and comfortable  Respiratory system: Clear to auscultation. Respiratory effort normal. Cardiovascular system: S1 & S2 heard, RRR. No JVD, murmurs, rubs, gallops or clicks.  Gastrointestinal system: Abdomen is nondistended, soft and nontender.  Normal bowel sounds heard. Central nervous system: Alert and oriented. No oriented to current date. Grossly intact. Answers questions appropriately Extremities:no edema Skin: warm, dry Psychiatry: . Mood & affect appropriate.     Data Reviewed: I have personally reviewed following labs and imaging studies  CBC: Recent Labs  Lab 04/20/20 0729 04/21/20 0640 04/21/20 1426  WBC 5.7 7.9  --   NEUTROABS 3.7  --   --   HGB 8.0* 6.4* 8.1*  HCT 26.9* 21.6* 27.9*  MCV 95.7 93.9  --   PLT 246 157  --     Basic Metabolic Panel: Recent Labs  Lab 04/20/20 0729 04/20/20 1822 04/21/20 0640  NA 136  --  138  K 4.9  --  3.9  CL 100  --  101  CO2 27  --  28  GLUCOSE 47*  --  137*  BUN 30*  --  20  CREATININE 3.59*  --  2.59*  CALCIUM 8.5*  --  8.1*  PHOS  --  2.8 3.1   GFR: Estimated Creatinine Clearance: 26.5 mL/min (A) (by C-G formula based on SCr of 2.59 mg/dL (H)). Liver Function Tests: Recent Labs  Lab 04/20/20 0729  AST 39  ALT 41  ALKPHOS 348*  BILITOT 0.5  PROT 6.6  ALBUMIN 2.8*   No results for input(s): LIPASE, AMYLASE in the last 168 hours. No results for input(s): AMMONIA in the last 168 hours. Coagulation Profile: Recent Labs  Lab 04/20/20 0729  INR 1.3*   Cardiac Enzymes: No results for input(s): CKTOTAL, CKMB, CKMBINDEX, TROPONINI in the last 168 hours. BNP (last 3 results) No results for input(s): PROBNP in the last 8760 hours. HbA1C: No results for input(s): HGBA1C in the last 72 hours. CBG: Recent Labs  Lab 04/20/20 2027 04/20/20 2336 04/21/20 0346 04/21/20 0805 04/21/20 1645  GLUCAP 74 127* 139* 132* 330*   Lipid Profile: No results for input(s): CHOL, HDL, LDLCALC, TRIG, CHOLHDL, LDLDIRECT in the last 72 hours.  Thyroid Function Tests: No results for input(s): TSH, T4TOTAL, FREET4, T3FREE, THYROIDAB in the last 72 hours. Anemia Panel: No results for input(s): VITAMINB12, FOLATE, FERRITIN, TIBC, IRON, RETICCTPCT in the last 72 hours. Sepsis Labs: Recent Labs  Lab 04/20/20 0729 04/20/20 0815  LATICACIDVEN 1.8 2.3*    Recent Results (from the past 240 hour(s))  Resp Panel by RT-PCR (Flu A&B, Covid) Nasopharyngeal Swab     Status: None   Collection Time: 04/20/20  7:30 AM   Specimen: Nasopharyngeal Swab; Nasopharyngeal(NP) swabs in vial transport medium  Result Value Ref Range Status   SARS Coronavirus 2 by RT PCR NEGATIVE NEGATIVE Final    Comment: (NOTE) SARS-CoV-2 target nucleic acids are NOT DETECTED.  The SARS-CoV-2 RNA is  generally detectable in upper respiratory specimens during the acute phase of infection. The lowest concentration of SARS-CoV-2 viral copies this assay can detect is 138 copies/mL. A negative result does not preclude SARS-Cov-2 infection and should not be used as the sole basis for treatment or other patient management decisions. A negative result may occur with  improper specimen collection/handling, submission of specimen other than nasopharyngeal swab, presence of viral mutation(s) within the areas targeted by this assay, and inadequate number of viral copies(<138 copies/mL). A negative result must be combined with clinical observations, patient history, and epidemiological information. The expected result is Negative.  Fact Sheet for Patients:  EntrepreneurPulse.com.au  Fact Sheet for Healthcare Providers:  IncredibleEmployment.be  This test is no t yet approved or cleared by the [place] FDA and  has been authorized for detection and/or diagnosis of SARS-CoV-2 by FDA under an Emergency Use Authorization (EUA). This EUA will remain  in effect (meaning this test can be used) for the duration of the COVID-19 declaration under Section 564(b)(1) of the Act, 21 U.S.C.section 360bbb-3(b)(1), unless the authorization is terminated  or revoked sooner.       Influenza A by PCR NEGATIVE NEGATIVE Final   Influenza B by PCR NEGATIVE NEGATIVE Final    Comment: (NOTE) The Xpert Xpress SARS-CoV-2/FLU/RSV plus assay is intended as an aid in the diagnosis of influenza from Nasopharyngeal swab specimens and should not be used as a sole basis for treatment. Nasal washings and aspirates are unacceptable for Xpert Xpress SARS-CoV-2/FLU/RSV testing.  Fact Sheet for Patients: EntrepreneurPulse.com.au  Fact Sheet for Healthcare Providers: IncredibleEmployment.be  This test is not yet approved or cleared by the [place] FDA and has been authorized for detection and/or diagnosis of SARS-CoV-2 by FDA under an Emergency Use Authorization (EUA). This EUA will remain in effect (meaning this test can be used) for the duration of the COVID-19 declaration under Section 564(b)(1) of the Act, 21 U.S.C. section 360bbb-3(b)(1), unless the authorization is terminated or revoked.  Performed at [facility], [address] [place], [place] [address]   Blood Culture (routine x 2)     Status: None (Preliminary result)   Collection Time: 04/20/20  8:14 AM   Specimen: BLOOD  Result Value Ref Range Status   Specimen Description BLOOD LEFT ARM  Final   Special Requests   Final    BOTTLES DRAWN AEROBIC AND ANAEROBIC Blood Culture adequate volume   Culture   Final    NO GROWTH < 24 HOURS Performed at [facility], [address] [place], [place] [address]    Report Status PENDING  Incomplete  Blood Culture (routine x 2)     Status: None (Preliminary result)   Collection Time: 04/20/20  8:15 AM  Specimen: BLOOD  Result Value Ref Range Status   Specimen Description BLOOD LEFT WRIST  Final   Special Requests   Final    BOTTLES DRAWN AEROBIC AND ANAEROBIC Blood Culture adequate volume   Culture   Final    NO GROWTH < 24 HOURS Performed at [facility], [address] [place], [place] [address]    Report Status PENDING  Incomplete  MRSA PCR Screening     Status: None   Collection Time: 04/20/20  2:31 PM   Specimen: Nasal Mucosa; Nasopharyngeal  Result Value Ref Range Status   MRSA by PCR NEGATIVE NEGATIVE Final    Comment:        The GeneXpert MRSA Assay (FDA approved for NASAL specimens only), is one component of a comprehensive MRSA colonization surveillance program. It is not intended to diagnose MRSA infection nor to guide or monitor treatment for MRSA infections. Performed at [facility], [address], [place], [place] [address]           Radiology Studies: [facility] Chest [place] 1 View  Result Date: 04/20/2020 CLINICAL DATA:  Sepsis. History of end-stage renal disease on hemodialysis. EXAM: PORTABLE CHEST 1 VIEW COMPARISON:  Chest x-ray 04/08/2020 FINDINGS: The heart is enlarged but appears stable. Stable tortuosity and calcification of the thoracic aorta. Asymmetric interstitial and airspace process in the right lung worrisome for pneumonia. Underlying interstitial edema is noted. Small left pleural effusion. IMPRESSION: Right lung infiltrate with background of interstitial edema. Small left pleural effusion. Electronically Signed   By: Marijo Sanes M.D.   On: 04/20/2020 07:42   DG Humerus Right  Result Date: 04/20/2020 CLINICAL DATA:  59 year old male ICU patient status post fall. EXAM: RIGHT HUMERUS - 2+ VIEW COMPARISON:  Portable chest radiograph earlier today. FINDINGS: Bone mineralization is within normal limits. Alignment appears grossly preserved at the right shoulder and elbow. There is no evidence of fracture or other focal bone lesions. Stable visible right chest. IMPRESSION: No acute fracture or dislocation identified about the right humerus. Electronically Signed   By: Genevie Ann M.D.   On: 04/20/2020 18:38        Scheduled Meds: . acetaminophen  650 mg Oral Once  . aspirin EC  81 mg Oral Daily  . carvedilol  6.25 mg Oral BID WC  . Chlorhexidine Gluconate Cloth  6 each Topical Q0600  . epoetin alfa  4,000 Units Intravenous Q T,Th,Sa-HD  . folic acid  1 mg Oral Daily  . heparin  5,000 Units Subcutaneous Q8H  . hydrALAZINE  25 mg Oral BID  . insulin aspart  0-5 Units Subcutaneous QHS  . insulin aspart  0-9 Units Subcutaneous TID WC  . melatonin  5 mg Oral QHS  . mirtazapine  7.5 mg Oral QHS  . multivitamin  1 tablet Oral Daily  . sacubitril-valsartan  1 tablet Oral BID  . sertraline  150 mg Oral Daily   Continuous Infusions: . sodium chloride    . sodium chloride    . ampicillin-sulbactam (UNASYN) IV  Stopped (04/21/20 1448)    Assessment & Plan:   Principal Problem:   Acute metabolic encephalopathy Active Problems:   ESRD on dialysis ([place])   Depression   DM (diabetes mellitus), type 2 with complications ([facility])   Aspiration pneumonia ([place])   Acute respiratory failure ([place])   Acute metabolic encephalopathy Patient was found unresponsive with agonal breathing by his wife Most likely secondary to hypoglycemia Patient had a blood sugar of 31 per  EMS and upon arrival to the ER received an amp of D50 with transient improvement in his blood sugars 12/9-Improved. blood glucose stable.  Eating.Will DC D10W  Continue blood glucose checks  As blood glucose elevated at needle R-ISS will resume       Diabetes mellitus with complications of end-stage renal disease and hypoglycemia Patient's dialysis days are Tuesday/Thursday/Saturday Patient has frequent hypoglycemic episodes and was recently hospitalized for same.  His last hemoglobin A1c is 6.5. He will require adjustment in his insulin dose upon discharge 12/9-getting dialysis today Also had dialysis yesterday for volume removal Nephrology following   Acute respiratory failure Patient was hypoxic in the field with room air pulse oximetry of 77% and was placed on a nonrebreather mask with improvement in his pulse oximetry He is tachypneic and has increased work of breathing and is currently on a BiPAP to reduce work of breathing Acute respiratory failure appears to be multifactorial and may be related to pulmonary edema as well as aspiration pneumonia 12/9-weaned off of BiPAP on nasal cannula Continue Unasyn for aspiration pneumonia Getting dialysis today    Aspiration pneumonia Off of BiPAP Continue IV antibiotics     Anemia of end-stage renal disease Hemoglobin 6.4-discussed with patient he is agreeable to transfusion 1 unit during dialysis today Monitor H&H   Depression continue Remeron and Zoloft  when patient's p.o. increases      Chronic systolic heart failure Last known LVEF of 45 - 50% Continue Entresto, carvedilol and hydralazine    SIRS due to infectious process with acute organ dysfunction Patient has elevated lactic acid levels but does not meet sepsis criteria at this time He is hypothermic and on a [facility], he is tachypneic but has a normal white cell count with differential. Chest x-ray shows a right lower lobe infiltrate consistent with aspiration 12/9-ck lactic acid, continu abx as above    DVT prophylaxis: heparin Code Status: Full Family Communication: None at bedside  Status is: Inpatient  Remains inpatient appropriate because:Inpatient level of care appropriate due to severity of illness   Dispo: The patient is from: Home              Anticipated d/c is to: Home              Anticipated d/c date is: 1-2 days              Patient currently is not medically stable to d/c.            LOS: 1 day   Time spent: 35 min with >50% on coc    Nolberto Hanlon, MD [facility] Pager [phone]  If 7PM-7AM, please contact night-coverage 04/21/2020, 5:39 PM

## 2020-04-22 LAB — CBC
HCT: 25.2 % — ABNORMAL LOW (ref 39.0–52.0)
Hemoglobin: 7.8 g/dL — ABNORMAL LOW (ref 13.0–17.0)
MCH: 28.7 pg (ref 26.0–34.0)
MCHC: 31 g/dL (ref 30.0–36.0)
MCV: 92.6 fL (ref 80.0–100.0)
Platelets: 151 10*3/uL (ref 150–400)
RBC: 2.72 MIL/uL — ABNORMAL LOW (ref 4.22–5.81)
RDW: 20.9 % — ABNORMAL HIGH (ref 11.5–15.5)
WBC: 5.8 10*3/uL (ref 4.0–10.5)
nRBC: 0 % (ref 0.0–0.2)

## 2020-04-22 LAB — GLUCOSE, CAPILLARY
Glucose-Capillary: 139 mg/dL — ABNORMAL HIGH (ref 70–99)
Glucose-Capillary: 252 mg/dL — ABNORMAL HIGH (ref 70–99)
Glucose-Capillary: 226 mg/dL — ABNORMAL HIGH (ref 70–99)

## 2020-04-22 LAB — LACTIC ACID, PLASMA: Lactic Acid, Venous: 2.5 mmol/L (ref 0.5–1.9)

## 2020-04-22 LAB — PARATHYROID HORMONE, INTACT (NO CA): PTH: 100 pg/mL — ABNORMAL HIGH (ref 15–65)

## 2020-04-22 MED ORDER — AMOXICILLIN-POT CLAVULANATE 500-125 MG PO TABS: 1.0000 | ORAL_TABLET | Freq: Every day | ORAL | Status: DC

## 2020-04-22 MED ORDER — LANTUS SOLOSTAR 100 UNIT/ML ~~LOC~~ SOPN: 2.0000 [IU] | PEN_INJECTOR | Freq: Every day | SUBCUTANEOUS | 11 refills | Status: DC

## 2020-04-22 MED ORDER — AMOXICILLIN-POT CLAVULANATE 500-125 MG PO TABS: 1.0000 | ORAL_TABLET | Freq: Every day | ORAL | 0 refills | Status: AC

## 2020-04-22 MED ORDER — SODIUM CHLORIDE 0.9 % IV SOLN: 3.0000 g | INTRAVENOUS | Status: DC

## 2020-04-22 NOTE — Evaluation (Signed)
Occupational Therapy Evaluation Patient Details Name: [name] MRN: [id number] DOB: 1961-04-30 Today's Date: 04/22/2020    History of Present Illness Pt is admitted for acute metabolic encephalopathy. History includes ESRD on HD, DM, HTN, CVA, and GERD. Pt on baseline level of 2L of O2, however acutely is at 6L of 02.   Clinical Impression   Patient presenting with decreased I in self care, balance, functional mobility/transfer, endurance, and safety awareness, and strength. Patient with slow processing and obvious cognitive deficits. Unsure if cognition is baseline as no family present this session. Pt reports being mod I with use of RW PTA and lives with wife. Patient refusing all OOB activities and asking therapist "Why?" when asked to sit EOB and attempt standing. OT providing education for participation but pt refusing. Bed mobility with min A.  Patient will benefit from acute OT to increase overall independence in the areas of ADLs, functional mobility, and safety awareness in order to safely discharge yo next venue of care.    Follow Up Recommendations  SNF;Supervision/Assistance - 24 hour    Equipment Recommendations  Other (comment) (defer to next venue of care)       Precautions / Restrictions Precautions Precautions: Fall Restrictions Weight Bearing Restrictions: No      Mobility Bed Mobility Overal bed mobility: Needs Assistance Bed Mobility: Rolling Rolling: Min assist       Transfers   General transfer comment: pt refusal    Balance Overall balance assessment: Needs assistance Sitting-balance support: No upper extremity supported Sitting balance-Leahy Scale: Good Sitting balance - Comments: pt refusal       Standing balance comment: pt refusal         ADL either performed or assessed with clinical judgement   ADL Overall ADL's : Needs assistance/impaired     Grooming: Wash/dry hands;Wash/dry face;Oral care;Minimal assistance;Bed level      General ADL Comments: Pt refusal for all OOB activities this session     Vision Patient Visual Report: No change from baseline              Pertinent Vitals/Pain Pain Assessment: Faces Faces Pain Scale: No hurt     Hand Dominance Left   Extremity/Trunk Assessment Upper Extremity Assessment Upper Extremity Assessment: Generalized weakness   Lower Extremity Assessment Lower Extremity Assessment: Generalized weakness       Communication Communication Communication: Other (comment) (Pt with very soft voice and needing increased time to process)   Cognition Arousal/Alertness: Awake/alert Behavior During Therapy: Flat affect Overall Cognitive Status: No family/caregiver present to determine baseline cognitive functioning      General Comments: Pt unable to verbalize month. Pt verbalized year as "2002". Pt with very slow processing and needing increased time this session.              Home Living Family/patient expects to be discharged to:: Private residence Living Arrangements: Spouse/significant other Available Help at Discharge: Family;Available PRN/intermittently Type of Home: House Home Access: Level entry     Home Layout: One level     Bathroom Shower/Tub: Occupational psychologist: Standard Bathroom Accessibility: Yes   Home Equipment: Grab bars - tub/shower;Grab bars - toilet;Cane - single point;Walker - 2 wheels          Prior Functioning/Environment Level of Independence: Independent with assistive device(s)        Comments: Pt reports he was independent with use of RW for all aspects of care. Pt is likely poor historian and no family present  to confirm baseline or home set up        OT Problem List: Decreased strength;Decreased activity tolerance;Impaired balance (sitting and/or standing);Decreased safety awareness;Pain;Cardiopulmonary status limiting activity;Decreased coordination;Decreased cognition      OT  Treatment/Interventions: Self-care/ADL training;Therapeutic exercise;Balance training;Energy conservation;Cognitive remediation/compensation;Therapeutic activities;DME and/or AE instruction;Patient/family education    OT Goals(Current goals can be found in the care plan section) Acute Rehab OT Goals Patient Stated Goal: none verbalized OT Goal Formulation: Patient unable to participate in goal setting Time For Goal Achievement: 05/06/20 Potential to Achieve Goals: Good ADL Goals Pt Will Perform Grooming: with modified independence;standing Pt Will Transfer to Toilet: with modified independence;ambulating Pt Will Perform Toileting - Clothing Manipulation and hygiene: with modified independence;sit to/from stand  OT Frequency: Min 1X/week   Barriers to D/C: Other (comment)  none known at this time          AM-PAC OT "6 Clicks" Daily Activity     Outcome Measure Help from another person eating meals?: A Little Help from another person taking care of personal grooming?: A Little Help from another person toileting, which includes using toliet, bedpan, or urinal?: A Lot Help from another person bathing (including washing, rinsing, drying)?: A Lot Help from another person to put on and taking off regular upper body clothing?: A Little Help from another person to put on and taking off regular lower body clothing?: A Lot 6 Click Score: 15   End of Session Equipment Utilized During Treatment: Oxygen Nurse Communication: Mobility status  Activity Tolerance: Patient tolerated treatment well Patient left: in bed;with call bell/phone within reach;with bed alarm set  OT Visit Diagnosis: Muscle weakness (generalized) (M62.81)                Time: 4098-1191 OT Time Calculation (min): 14 min Charges:  OT General Charges $OT Visit: 1 Visit OT Evaluation $OT Eval Low Complexity: 1 Low  Darleen Crocker, MS, OTR/L , CBIS ascom [phone]  04/22/20, 1:44 PM

## 2020-04-22 NOTE — TOC Initial Note (Signed)
Transition of Care [facility]) - Initial/Assessment Note    Patient Details  Name: [name] MRN: [id number] Date of Birth: [birth year]  [facility] CM/SW Contact:    Beverly Sessions, RN Phone Number: 04/22/2020, 3:50 PM  Clinical Narrative:                 Patient admitted from home with acute metabolic encephalopathy  Patient lives at home with wife  Wife states that she transports him to appointments and HD  Patient to discharge today  Wife will transport after 7pm  PT and OT recommend SNF.  Patient is adamant that he will not go to SNF.  Wife updated.  Patient agreeable to home health. Does not have a preference of agency  [facility] and [facility] unable to accept referral for home health Awaiting return message from [facility], [facility], and [facility] home health  Patient, wife, and MD are aware that I have been unable to establish home health services for discharge.  Patient is not interested in out patient services  Patient is already followed by [facility] in the home  Expected Discharge Plan: Home/Self Care Barriers to Discharge: No [facility] will accept this patient   Patient Goals and CMS Choice        Expected Discharge Plan and Services Expected Discharge Plan: Home/Self Care   Discharge Planning Services: CM Consult   Living arrangements for the past 2 months: [place] Expected Discharge Date: 04/22/20                                    Prior Living Arrangements/Services Living arrangements for the past 2 months: Single Family Home Lives with:: Spouse   Do you feel safe going back to the place where you live?: Yes          Current home services: DME    Activities of Daily Living Home Assistive Devices/Equipment: Cane (specify quad or straight) ADL Screening (condition at time of admission) Patient's cognitive ability adequate to safely complete daily activities?: Yes Is the patient deaf  or have difficulty hearing?: No Does the patient have difficulty seeing, even when wearing glasses/contacts?: No Does the patient have difficulty concentrating, remembering, or making decisions?: No Patient able to express need for assistance with ADLs?: Yes Does the patient have difficulty dressing or bathing?: No Independently performs ADLs?: No Communication: Needs assistance Is this a change from baseline?: Pre-admission baseline Dressing (OT): Needs assistance Is this a change from baseline?: Pre-admission baseline Grooming: Needs assistance Is this a change from baseline?: Pre-admission baseline Feeding: Independent Is this a change from baseline?: Pre-admission baseline Bathing: Needs assistance Is this a change from baseline?: Pre-admission baseline Toileting: Needs assistance Is this a change from baseline?: Pre-admission baseline In/Out Bed: Needs assistance Is this a change from baseline?: Pre-admission baseline Walks in Home: Needs assistance Is this a change from baseline?: Pre-admission baseline Does the patient have difficulty walking or climbing stairs?: Yes Weakness of Legs: Both Weakness of Arms/Hands: Both  Permission Sought/Granted                  Emotional Assessment       Orientation: : Oriented to Self,Oriented to Place,Oriented to  Time,Oriented to Situation   Psych Involvement: No (comment)  Admission diagnosis:  Hypoglycemia [E16.2] Acute and chronic respiratory failure with hypoxia (HCC) [J96.21] Altered mental status, unspecified altered mental status  type [V37.10] Acute metabolic encephalopathy [G26.94] Sepsis, due to unspecified organism, unspecified whether acute organ dysfunction present [facility]) [A41.9] Patient Active Problem List   Diagnosis Date Noted  . Aspiration pneumonia ([place]) 04/20/2020  . Acute respiratory failure ([place]) 04/20/2020  . Hypoglycemia due to insulin 04/08/2020  . Lactic acidosis 04/08/2020  . Anemia due to end  stage renal disease ([place]) 04/08/2020  . Acute on chronic systolic CHF (congestive heart failure) ([place]) 01/10/2020  . Severe mitral regurgitation 01/10/2020  . Acute on chronic congestive heart failure ([place])   . Acute on chronic respiratory failure with hypoxemia ([place]) 12/29/2019  . Aspiration pneumonia of both lower lobes due to gastric secretions ([place]) 12/29/2019  . Diabetic foot infection ([place]) 12/07/2019  . Acute pulmonary edema ([place]) 12/07/2019  . Paronychia of second toe of left foot 11/09/2019  . Chronic osteomyelitis of toe of left foot ([place]) 11/09/2019  . DM (diabetes mellitus), type 2 with complications ([place]) 85/46/2703  . Hypoglycemia associated with diabetes ([place]) 08/24/2019  . Acute respiratory failure with hypoxia ([place])   . Acute metabolic encephalopathy 50/01/3817  . Hypertensive emergency 07/06/2019  . Acute encephalopathy 07/06/2019  . Seizures due to metabolic disorder ([place]) 29/93/7169  . Hyperosmolar hyperglycemic state (HHS) ([place]) 06/05/2019  . Symptomatic anemia 06/04/2019  . Frequent falls 06/04/2019  . History of CVA (cerebrovascular accident) 06/04/2019  . HCAP (healthcare-associated pneumonia) 06/04/2019  . Acute blood loss anemia 06/04/2019  . Hyperglycemia 05/28/2019  . Fall at home, initial encounter 05/28/2019  . Rib fracture 05/28/2019  . Depression 05/28/2019  . Hypokalemia 05/28/2019  . Weakness 05/27/2019  . ESRD on dialysis ([place])   . Acute renal failure (ARF) ([place]) 04/09/2019  . Polyp of ascending colon   . Diarrhea   . AKI (acute kidney injury) ([place]) 03/04/2019  . Acute kidney injury ([place]) 07/26/2018  . ARF (acute renal failure) ([place]) 07/25/2018  . Bilateral leg numbness 03/19/2018  . Numbness and tingling of both feet 03/19/2018  . CVA (cerebral vascular accident) ([place]) 03/17/2018  . Moderate episode of recurrent major depressive disorder ([place]) 03/11/2018  . UTI (urinary tract infection) 02/27/2018  . Hypoglycemia 01/15/2018  . Type 2  diabetes mellitus without complication, with long-term current use of insulin ([place]) 08/27/2017  . Protein-calorie malnutrition, severe 08/19/2017  . Pancreatitis, acute 08/16/2017  . DKA (diabetic ketoacidoses) 08/16/2017  . HTN (hypertension) 08/16/2017  . HLD (hyperlipidemia) 08/16/2017  . Carotid stenosis 08/02/2016  . GERD (gastroesophageal reflux disease) 08/02/2016  . History of esophagogastroduodenoscopy (EGD) 07/01/2016  . History of recent blood transfusion 07/01/2016  . Acute renal failure superimposed on stage 4 chronic kidney disease ([place]) 07/01/2016  . Hyponatremia 07/01/2016  . Uncontrolled diabetes mellitus ([place]) 07/01/2016  . Alcohol abuse 07/01/2016  . Monilial esophagitis ([place]) 07/01/2016  . Tobacco abuse 07/01/2016  . Confusion 06/29/2016  . Iron deficiency anemia 06/29/2016  . Coagulopathy ([place]) 06/29/2016   PCP:  [name], MD Pharmacy:   [facility] [phone] - [address], [address] AT [address] [place] [address] Phone: [phone] Fax: [phone]     Social Determinants of Health (SDOH) Interventions    Readmission Risk Interventions Readmission Risk Prevention Plan 04/22/2020 04/08/2020 12/29/2019  Transportation Screening Complete Complete Complete  PCP or Specialist Appt within 3-5 Days - - -  [facility] or [place] - - -  Palliative Care Screening - - -  Medication Review (RN Care Manager) Complete  Complete Complete  PCP or Specialist appointment within 3-5 days of discharge Complete Complete Complete  HRI or Home Care Consult Complete Complete Complete  SW Recovery Care/Counseling Consult - Complete Complete  Palliative Care Screening Not Applicable Not Applicable Not [place] Patient Refused Not Applicable Not Applicable  Some recent data might be hidden

## 2020-04-22 NOTE — Progress Notes (Signed)
[facility]  ROUNDING NOTE   Subjective:  Patient resting in bed, he is on 5 L of supplemental O2 via nasal cannula.  He denies worsening shortness of breath. We dialyzed him yesterday with ultrafiltration of 1.5 L, tolerated treatment well.  Objective:  Vital signs in last 24 hours:  Temp:  [97.8 F (36.6 C)-98.5 F (36.9 C)] 97.8 F (36.6 C) (12/10 0837) Pulse Rate:  [69-96] 73 (12/10 1325) Resp:  [16-22] 16 (12/10 0837) BP: (109-142)/(58-90) 139/74 (12/10 0837) SpO2:  [86 %-100 %] 92 % (12/10 1325)  Weight change:  Filed Weights   04/20/20 0703 04/20/20 1411  Weight: 70.3 kg 61.7 kg    Intake/Output: I/O last 3 completed shifts: In: 1991.9 [P.O.:714; I.V.:480; Blood:646; IV Piggyback:151.9] Out: 1500 [Other:1500]   Intake/Output this shift:  No intake/output data recorded.  Physical Exam: General:  In no acute distress  Head: .Oral mucous membranes moist  Eyes:  Anicteric  Lungs:   Normal respiratory effort, requiring 5 L O2 via nasal cannula, lungs with rhonchi  Heart:  Regular rate and rhythm  Abdomen:  Soft, nontender, nondistended  Extremities:  No lower extremity edema  Neurologic:  Oriented, speech clear  Skin:  No acute lesions or rashes  Access:  Right AVF +bruit,+thrill    Basic Metabolic Panel: Recent Labs  Lab 04/20/20 0729 04/20/20 1822 04/21/20 0640  NA 136  --  138  K 4.9  --  3.9  CL 100  --  101  CO2 27  --  28  GLUCOSE 47*  --  137*  BUN 30*  --  20  CREATININE 3.59*  --  2.59*  CALCIUM 8.5*  --  8.1*  PHOS  --  2.8 3.1    Liver Function Tests: Recent Labs  Lab 04/20/20 0729  AST 39  ALT 41  ALKPHOS 348*  BILITOT 0.5  PROT 6.6  ALBUMIN 2.8*   No results for input(s): LIPASE, AMYLASE in the last 168 hours. No results for input(s): AMMONIA in the last 168 hours.  CBC: Recent Labs  Lab 04/20/20 0729 04/21/20 0640 04/21/20 1426 04/22/20 0623  WBC 5.7 7.9  --  5.8  NEUTROABS 3.7  --   --   --   HGB 8.0*  6.4* 8.1* 7.8*  HCT 26.9* 21.6* 27.9* 25.2*  MCV 95.7 93.9  --  92.6  PLT 246 157  --  151    Cardiac Enzymes: No results for input(s): CKTOTAL, CKMB, CKMBINDEX, TROPONINI in the last 168 hours.  BNP: Invalid input(s): POCBNP  CBG: Recent Labs  Lab 04/21/20 0805 04/21/20 1645 04/21/20 2139 04/22/20 0746 04/22/20 1206  GLUCAP 132* 330* 254* 139* 252*    Microbiology: Results for orders placed or performed during the hospital encounter of 04/20/20  Resp Panel by RT-PCR (Flu A&B, Covid) Nasopharyngeal Swab     Status: None   Collection Time: 04/20/20  7:30 AM   Specimen: Nasopharyngeal Swab; Nasopharyngeal(NP) swabs in vial transport medium  Result Value Ref Range Status   SARS Coronavirus 2 by RT PCR NEGATIVE NEGATIVE Final    Comment: (NOTE) SARS-CoV-2 target nucleic acids are NOT DETECTED.  The SARS-CoV-2 RNA is generally detectable in upper respiratory specimens during the acute phase of infection. The lowest concentration of SARS-CoV-2 viral copies this assay can detect is 138 copies/mL. A negative result does not preclude SARS-Cov-2 infection and should not be used as the sole basis for treatment or other patient management decisions. A negative result may occur  with  improper specimen collection/handling, submission of specimen other than nasopharyngeal swab, presence of viral mutation(s) within the areas targeted by this assay, and inadequate number of viral copies(<138 copies/mL). A negative result must be combined with clinical observations, patient history, and epidemiological information. The expected result is Negative.  Fact Sheet for Patients:  EntrepreneurPulse.com.au  Fact Sheet for Healthcare Providers:  IncredibleEmployment.be  This test is no t yet approved or cleared by the [place] FDA and  has been authorized for detection and/or diagnosis of SARS-CoV-2 by FDA under an Emergency Use Authorization (EUA).  This EUA will remain  in effect (meaning this test can be used) for the duration of the COVID-19 declaration under Section 564(b)(1) of the Act, 21 U.S.C.section 360bbb-3(b)(1), unless the authorization is terminated  or revoked sooner.       Influenza A by PCR NEGATIVE NEGATIVE Final   Influenza B by PCR NEGATIVE NEGATIVE Final    Comment: (NOTE) The Xpert Xpress SARS-CoV-2/FLU/RSV plus assay is intended as an aid in the diagnosis of influenza from Nasopharyngeal swab specimens and should not be used as a sole basis for treatment. Nasal washings and aspirates are unacceptable for Xpert Xpress SARS-CoV-2/FLU/RSV testing.  Fact Sheet for Patients: EntrepreneurPulse.com.au  Fact Sheet for Healthcare Providers: IncredibleEmployment.be  This test is not yet approved or cleared by the [place] FDA and has been authorized for detection and/or diagnosis of SARS-CoV-2 by FDA under an Emergency Use Authorization (EUA). This EUA will remain in effect (meaning this test can be used) for the duration of the COVID-19 declaration under Section 564(b)(1) of the Act, 21 U.S.C. section 360bbb-3(b)(1), unless the authorization is terminated or revoked.  Performed at [facility], [address] [place], [place] [address]   Blood Culture (routine x 2)     Status: None (Preliminary result)   Collection Time: 04/20/20  8:14 AM   Specimen: BLOOD  Result Value Ref Range Status   Specimen Description BLOOD LEFT ARM  Final   Special Requests   Final    BOTTLES DRAWN AEROBIC AND ANAEROBIC Blood Culture adequate volume   Culture   Final    NO GROWTH 2 DAYS Performed at [facility], [address], [place], [place] [address]    Report Status PENDING  Incomplete  Blood Culture (routine x 2)     Status: None (Preliminary result)   Collection Time: 04/20/20  8:15 AM   Specimen: BLOOD  Result Value Ref Range Status   Specimen Description  BLOOD LEFT WRIST  Final   Special Requests   Final    BOTTLES DRAWN AEROBIC AND ANAEROBIC Blood Culture adequate volume   Culture   Final    NO GROWTH 2 DAYS Performed at [facility], [address], [place], [place] [address]    Report Status PENDING  Incomplete  MRSA PCR Screening     Status: None   Collection Time: 04/20/20  2:31 PM   Specimen: Nasal Mucosa; Nasopharyngeal  Result Value Ref Range Status   MRSA by PCR NEGATIVE NEGATIVE Final    Comment:        The GeneXpert MRSA Assay (FDA approved for NASAL specimens only), is one component of a comprehensive MRSA colonization surveillance program. It is not intended to diagnose MRSA infection nor to guide or monitor treatment for MRSA infections. Performed at [facility], [address], [place], [place] [address]     Coagulation Studies: Recent Labs    04/20/20 [phone]  LABPROT 15.2  INR 1.3*    Urinalysis: No results for input(s): COLORURINE, LABSPEC, PHURINE, GLUCOSEU, HGBUR, BILIRUBINUR, KETONESUR, PROTEINUR, UROBILINOGEN, NITRITE, LEUKOCYTESUR in the last 72 hours.  Invalid input(s): APPERANCEUR    Imaging: DG Humerus Right  Result Date: 04/20/2020 CLINICAL DATA:  59 year old male ICU patient status post fall. EXAM: RIGHT HUMERUS - 2+ VIEW COMPARISON:  Portable chest radiograph earlier today. FINDINGS: Bone mineralization is within normal limits. Alignment appears grossly preserved at the right shoulder and elbow. There is no evidence of fracture or other focal bone lesions. Stable visible right chest. IMPRESSION: No acute fracture or dislocation identified about the right humerus. Electronically Signed   By: Genevie Ann M.D.   On: 04/20/2020 18:38     Medications:   . sodium chloride    . sodium chloride    . ampicillin-sulbactam (UNASYN) IV     . acetaminophen  650 mg Oral Once  . aspirin EC  81 mg Oral Daily  . carvedilol  6.25 mg Oral BID WC  . Chlorhexidine Gluconate Cloth  6 each  Topical Q0600  . epoetin alfa  4,000 Units Intravenous Q T,Th,Sa-HD  . folic acid  1 mg Oral Daily  . heparin  5,000 Units Subcutaneous Q8H  . hydrALAZINE  25 mg Oral BID  . insulin aspart  0-5 Units Subcutaneous QHS  . insulin aspart  0-9 Units Subcutaneous TID WC  . melatonin  5 mg Oral QHS  . mirtazapine  7.5 mg Oral QHS  . multivitamin  1 tablet Oral Daily  . sacubitril-valsartan  1 tablet Oral BID  . sertraline  150 mg Oral Daily   sodium chloride, sodium chloride, acetaminophen **OR** acetaminophen, alteplase, heparin, HYDROmorphone (DILAUDID) injection, lidocaine (PF), lidocaine-prilocaine, lidocaine-prilocaine, ondansetron **OR** ondansetron (ZOFRAN) IV, pentafluoroprop-tetrafluoroeth  Assessment/ Plan:  [name] is a 59 y.o. black male with end stage renal disease on hemodialysis, hypertension, diabetes mellitus type II, hyperlipidemia, history of alcohol abuse who was admitted on 04/20/2020 for Hypoglycemia [E16.2] Acute and chronic respiratory failure with hypoxia ([place]) [J96.21] Altered mental status, unspecified altered mental status type [P82.42] Acute metabolic encephalopathy [P53.61] Sepsis, due to unspecified organism, unspecified whether acute organ dysfunction present ([place]) [A41.9] due to hypoglycemia  [facility] TTS [place]. Right AVF 59.5kg  1. End Stage Renal Disease  Patient received dialysis treatment yesterday with UF of 1.5 L Volume and electrolyte status acceptable No additional dialysis required today We will continue TTS schedule  2. Anemia with chronic kidney disease Lab Results  Component Value Date   HGB 7.8 (L) 04/22/2020  We will continue monitoring CBCs.   3.  Acute respiratory failure. Respiratory status improved No acute respiratory distress noted, on 5 L of O2 via nasal cannula   4. Secondary Hyperparathyroidism Lab Results  Component Value Date   PTH 100 (H) 04/20/2020   CALCIUM 8.1 (L) 04/21/2020   CAION 1.00 (L)  12/25/2019   PHOS 3.1 04/21/2020  We will continue monitoring bone mineral metabolism parameters   LOS: 2 Jacquie Lukes 12/10/20212:08 PM

## 2020-04-22 NOTE — Progress Notes (Signed)
Pharmacy Antibiotic Note  [name] is a 59 y.o. male admitted on 04/20/2020 with Aspiration Pneumonia. PMH includes ESRD on HD, DM, HTN, CVA. Pharmacy was consulted for Unasyn dosing. This is day # 3 of Unasyn  Plan: adjust Unasyn to 3 grams IV every 24 hours scheduled after HD  Height: 5\' 11"  (180.3 cm) Weight: 61.7 kg (136 lb 0.4 oz) IBW/kg (Calculated) : 75.3  Temp (24hrs), Avg:98.3 F (36.8 C), Min:97.8 F (36.6 C), Max:98.8 F (37.1 C)  Recent Labs  Lab 04/20/20 0729 04/20/20 0815 04/21/20 0640 04/21/20 1806 04/21/20 2046 04/22/20 0623  WBC 5.7  --  7.9  --   --  5.8  CREATININE 3.59*  --  2.59*  --   --   --   LATICACIDVEN 1.8 2.3*  --  1.9 3.9*  --     Estimated Creatinine Clearance: 26.5 mL/min (A) (by C-G formula based on SCr of 2.59 mg/dL (H)).    Allergies  Allergen Reactions  . Ferrous Gluconate Nausea And Vomiting  . Other     Antimicrobials this admission: Vancomycin/Cefepime 12/8 x 1 Unasyn 12/8 >>   Microbiology results: 12/08 MRSA PCR: negative 12/08 BCx: NG x 2 days 12/08 SARS CoV-2: negative 12/08 influenza A/B: negative  Thank you for allowing pharmacy to be a part of this patient's care.  Vallery Sa, PharmD, BCPS 04/22/2020 8:55 AM

## 2020-04-22 NOTE — Plan of Care (Signed)
Pt Axox4. Calm and cooperative and able to voice his needs. Pt tolerates diet and takes pills whole. On IV Abx. On 6L [place]  (5L [place] chronic). Loose Bms overnight. Rt AV fistula in place, intact. Vitals stable. Safety measures in place. Will continue to monitor. Problem: Education: Goal: Knowledge of General Education information will improve Description: Including pain rating scale, medication(s)/side effects and non-pharmacologic comfort measures Outcome: Progressing   Problem: Health Behavior/Discharge Planning: Goal: Ability to manage health-related needs will improve Outcome: Progressing   Problem: Clinical Measurements: Goal: Ability to maintain clinical measurements within normal limits will improve Outcome: Progressing Goal: Will remain free from infection Outcome: Progressing Goal: Diagnostic test results will improve Outcome: Progressing Goal: Respiratory complications will improve Outcome: Progressing Goal: Cardiovascular complication will be avoided Outcome: Progressing   Problem: Activity: Goal: Risk for activity intolerance will decrease Outcome: Progressing   Problem: Nutrition: Goal: Adequate nutrition will be maintained Outcome: Progressing   Problem: Elimination: Goal: Will not experience complications related to bowel motility Outcome: Progressing Goal: Will not experience complications related to urinary retention Outcome: Progressing   Problem: Pain Managment: Goal: General experience of comfort will improve Outcome: Progressing   Problem: Safety: Goal: Ability to remain free from injury will improve Outcome: Progressing   Problem: Skin Integrity: Goal: Risk for impaired skin integrity will decrease Outcome: Progressing

## 2020-04-22 NOTE — Discharge Summary (Signed)
[name] QZE:[id number] DOB: [birth year] DOA: 04/20/2020  PCP: [name], MD  Admit date: 04/20/2020 Discharge date: 04/22/2020  Admitted From: home Disposition:  Home Refused PT  Recommendations for Outpatient Follow-up:  1. Follow up with PCP in 1 week 2. Please obtain BMP/CBC in one week 3. Follow-up with endocrinologist within 3 days for adjustment of his insulin  Discharge Condition:Stable CODE STATUS:full  Diet recommendation: renal diet   Brief/Interim Summary: Per HPI: [name] is a 59 y.o. male with medical history significant forend-stage renal disease on hemodialysis(dialysis days are T/TH/S),insulin-dependent diabetes mellitus, hypertension, CVA, GERD who was brought into the ER by EMS for evaluation of hypoglycemia Patient's wife stated that after supper last night his blood sugar was 138 and so she offered him a bedtime snack and he went to bed.  She did not administer any insulin to him last night.  She found him unresponsive this morning prior to leaving the house with agonal breathing he checked his blood sugar and it was 31 and so she called EMS because she was unable to give him any sugar replacement orally.  When EMS arrived patient was noted to have a blood sugar of 35 and they administered 1 mg of glucagon IM.  Upon arrival to the ER he was noted to have blood sugar of 46 and received 1 amp of D50. Per EMS patient's wife had initiated chest compressions prior to their arrival and he had room air pulse oximetry of 77% which improved to 91% on 10 L of oxygen via nonrebreather mask.  Added to the hospital service.  Nephrology was consulted as he needed dialysis.  Acute metabolic encephalopathy Patient was found unresponsive with agonal breathing by his wife Most likely secondary to hypoglycemia Mentation at baseline now   Diabetes mellitus with complications of end-stage renal disease and hypoglycemia Patient'sdialysis days are  Tuesday/Thursday/Saturdy Per wife he does not finish all his food at home Will decrease his Lantus on discharge and discontinue NovoLog Was instructed to follow-up with his endocrinologist within 3 days for further adjustment to prevent hypoglycemic episodes  Acute respiratory failure-due to aspiration pneumonia and volume overload Patient was hypoxic in the field with room air pulse oximetry of 77% and was placed on a nonrebreather mask with improvement in his pulse oximetry He was tachypneic and had increased work of breathing, placed on BiPAP  Weaned off now at baseline 5 L nasal cannula  Received hemodialysis during his hospitalization x2  Has dialysis tomorrow will need to follow-up with his center  We will finish his antibiotics for aspiration pneumonia      Aspiration pneumonia Off of BiPAP On 5 L baseline oxygen now Continue finishing antibiotic course, has 2 more days left     Anemia of end-stage renal disease Hemoglobin 6.4 on 04/21/20, patient was agreeable to 1 unit packed red blood cell transfusion during dialysis Hemoglobin remained stable We will need to monitor as outpatient by PCP Epogen with dialysis   Depression continue Remeron and Zoloft      Chronic systolic heart failure Last known LVEF of 45 - 50% Continue Entresto, carvedilol and hydralazine    SIRS due to infectious process with acute organ dysfunction Patient has elevated lactic acid levels but does not meet sepsis criteria at this time He was initially hypothermic and on a Bair hugger, and tachypneic but had a normal white cell count with differential. Chest x-ray shows a right lower lobe infiltrate consistent with aspiration Was treated for aspiration  pneumonia Has 2 more days of antibiotics left, will switch to Augmentin on discharge, renal dosed per pharmacy's recommendation     Discharge Diagnoses:  Principal Problem:   Acute metabolic encephalopathy Active  Problems:   ESRD on dialysis ([place])   Depression   DM (diabetes mellitus), type 2 with complications ([place])   Aspiration pneumonia ([place])   Acute respiratory failure [facility])    Discharge Instructions  Discharge Instructions    Call MD for:  difficulty breathing, headache or visual disturbances   Complete by: As directed    Call MD for:  persistant nausea and vomiting   Complete by: As directed    Diet - low sodium heart healthy   Complete by: As directed    Discharge instructions   Complete by: As directed    Start regular dialysis as normal. So need to go tomorrow Need to see your endocrinologist asap by next week for your diabetes Do not miss dialysis F/u with pcp in one week   Increase activity slowly   Complete by: As directed    No wound care   Complete by: As directed      Allergies as of 04/22/2020      Reactions   Ferrous Gluconate Nausea And Vomiting   Other       Medication List    STOP taking these medications   insulin lispro 100 UNIT/ML KwikPen Commonly known as: HumaLOG KwikPen     TAKE these medications   amoxicillin-clavulanate 500-125 MG tablet Commonly known as: AUGMENTIN Take 1 tablet (500 mg total) by mouth daily for 2 days. Start taking on: April 23, 2020   aspirin 81 MG EC tablet Take 1 tablet (81 mg total) by mouth daily.   carvedilol 6.25 MG tablet Commonly known as: COREG Take 6.25 mg by mouth 2 (two) times daily with a meal.   Entresto 24-26 MG Generic drug: sacubitril-valsartan Take 1 tablet by mouth 2 (two) times daily.   epoetin alfa 10000 UNIT/ML injection Commonly known as: EPOGEN Inject 0.4 mLs (4,000 Units total) into the vein Every Tuesday,Thursday,and Saturday with dialysis.   feeding supplement (NEPRO CARB STEADY) Liqd Take 237 mLs by mouth 3 (three) times daily between meals.   folic acid 1 MG tablet Commonly known as: FOLVITE Take 1 mg by mouth daily.   hydrALAZINE 25 MG tablet Commonly known as:  APRESOLINE Take 1 tablet (25 mg total) by mouth in the morning and at bedtime.   iron polysaccharides 150 MG capsule Commonly known as: NIFEREX Take 1 capsule (150 mg total) by mouth daily.   Lantus SoloStar 100 UNIT/ML Solostar Pen Generic drug: insulin glargine Inject 2 Units into the skin daily. What changed: how much to take   lidocaine-prilocaine cream Commonly known as: EMLA Apply 1 application topically as directed.   Melatonin 5 MG Caps Take 5 mg by mouth at bedtime.   mirtazapine 7.5 MG tablet Commonly known as: REMERON Take 7.5 mg by mouth at bedtime.   multivitamin Tabs tablet Take 1 tablet by mouth daily.   sertraline 100 MG tablet Commonly known as: ZOLOFT Take 1.5 tablets (150 mg total) by mouth daily.   thiamine 100 MG tablet Commonly known as: VITAMIN B-1 Take 100 mg by mouth daily.       Follow-up Information    Solum, Betsey Holiday, MD Follow up in 3 day(s).   Specialty: Endocrinology Why: needs asap f/u from hospitalization Contact information: [facility] [place] [address]  [phone]        [name], MD Follow up in 3 week(s).   Specialty: Internal Medicine Contact information: [place] [phone]              Allergies  Allergen Reactions  . Ferrous Gluconate Nausea And Vomiting  . Other     Consultations:  Nephrology   Procedures/Studies: DG Chest Port 1 View  Result Date: 04/20/2020 CLINICAL DATA:  Sepsis. History of end-stage renal disease on hemodialysis. EXAM: PORTABLE CHEST 1 VIEW COMPARISON:  Chest x-ray 04/08/2020 FINDINGS: The heart is enlarged but appears stable. Stable tortuosity and calcification of the thoracic aorta. Asymmetric interstitial and airspace process in the right lung worrisome for pneumonia. Underlying interstitial edema is noted. Small left pleural effusion. IMPRESSION: Right lung infiltrate with  background of interstitial edema. Small left pleural effusion. Electronically Signed   By: Marijo Sanes M.D.   On: 04/20/2020 07:42   DG Chest Portable 1 View  Result Date: 04/08/2020 CLINICAL DATA:  Missed dialysis EXAM: PORTABLE CHEST 1 VIEW COMPARISON:  01/09/2020 FINDINGS: Interstitial prominence and ill-defined increased density bilaterally. Probable trace left pleural effusion. Cardiomegaly. IMPRESSION: Cardiomegaly with findings suggestive of pulmonary edema. Electronically Signed   By: Macy Mis M.D.   On: 04/08/2020 08:20   DG Humerus Right  Result Date: 04/20/2020 CLINICAL DATA:  59 year old male ICU patient status post fall. EXAM: RIGHT HUMERUS - 2+ VIEW COMPARISON:  Portable chest radiograph earlier today. FINDINGS: Bone mineralization is within normal limits. Alignment appears grossly preserved at the right shoulder and elbow. There is no evidence of fracture or other focal bone lesions. Stable visible right chest. IMPRESSION: No acute fracture or dislocation identified about the right humerus. Electronically Signed   By: Genevie Ann M.D.   On: 04/20/2020 18:38   CT Angio Abd/Pel w/ and/or w/o  Result Date: 04/08/2020 CLINICAL DATA:  59 year old patient with end-stage renal disease on hemodialysis. Evaluate for acute mesenteric ischemia. Altered mental status and patient was found to be hypoglycemic. EXAM: CTA ABDOMEN AND PELVIS WITHOUT AND WITH CONTRAST TECHNIQUE: Multidetector CT imaging of the abdomen and pelvis was performed using the standard protocol during bolus administration of intravenous contrast. Multiplanar reconstructed images and MIPs were obtained and reviewed to evaluate the vascular anatomy. CONTRAST:  169mL OMNIPAQUE IOHEXOL 350 MG/ML SOLN COMPARISON:  CT abdomen pelvis 07/17/2019 FINDINGS: VASCULAR Aorta: Atherosclerotic calcifications involving the abdominal aorta without aneurysm or dissection. Celiac: Small amount of mixed plaque at the origin of the celiac trunk  causing mild stenosis. Stenosis is 50% or less. Main branch vessels are patent. SMA: Small amount of calcified plaque at the origin of the SMA without significant stenosis. No evidence for dissection or aneurysm. Incidentally, the patient has a replaced right hepatic artery originating from the proximal SMA. Renals: Single bilateral renal arteries are patent. Calcified plaque involving the origin and proximal aspect of the right renal artery causing mild-to-moderate stenosis but difficult to estimate the degree of stenosis. IMA: Patent. Inflow: Bilateral common, internal external iliac arteries have diffuse calcifications but patent. No significant stenosis involving the common or external iliac arteries. Proximal Outflow: Proximal femoral arteries are patent bilaterally. Veins: SMV, splenic vein and portal veins are patent on the delayed images. Limited evaluation of the IVC and iliac veins due to the timing of the study. Review of the MIP images confirms the above findings. NON-VASCULAR Lower chest: Small to moderate sized bilateral pleural effusions. Bilateral pleural fluid has  increased since 07/17/2019. Patchy densities in both lower lobes associated with atelectasis. In addition, there is parenchymal disease in the right lower lobe which raises concern for pneumonia or aspiration. Heart is enlarged, particularly the right atrium. Hepatobiliary: Evidence for periportal edema without a discrete liver lesion. No acute abnormality to the gallbladder. No significant biliary dilatation. Pancreas: Again noted are extensive calcifications throughout the pancreas compatible with chronic pancreatic inflammation. Patient has developed a large low-density structure medial to the pancreatic head. This structure measures roughly 5.3 x 4.1 x 4.3 cm and best seen on sequence 6, image 86. There appears to be low-density material fluid tracking between this collection and another low-density collection in the left upper abdomen  which is anterior to the spleen. The collection in the left upper abdomen measures 7.8 x 5.7 x 6.2 cm. Hounsfield units within these low-density collection are water attenuation and there is no definite gas within the fluid collections. Spleen: Normal in size without focal abnormality. Adrenals/Urinary Tract: No gross abnormality to the adrenal glands. There is enhancement of both kidneys without hydronephrosis. Small amount of fluid in the urinary bladder. Small low-density structure in the right kidney lower pole is too small to definitively characterize but could represent a cyst. Stomach/Bowel: Patient has small rectal tube or probe. Large amount of stool in the rectum and sigmoid colon. Multiple fluid-filled loops of small bowel in the right abdomen measuring up to 2.2 cm in diameter. Air-fluid collection in the right lower quadrant may be associated the cecum but poorly characterized. No acute abnormality to the stomach. Difficult to evaluate the bowel structures due to the lack of oral contrast and the lack of intra-abdominal fat. Difficult to exclude mesenteric edema. Lymphatic: No significant lymph node enlargement in the abdomen or pelvis. Reproductive: Prostate is unremarkable. Other: No significant ascites.  Diffuse subcutaneous edema. Musculoskeletal: No acute bone abnormality. IMPRESSION: VASCULAR 1. Diffuse atherosclerotic disease in the abdomen and pelvis. Main mesenteric arteries are patent without significant stenosis. Mild stenosis involving the origin and proximal aspect of the celiac trunk. Findings are not suggestive for a chronic mesenteric ischemia pattern. 2. Stenosis involving the right renal artery but difficult to accurately measure the degree of stenosis. 3.  Aortic Atherosclerosis (ICD10-I70.0). NON-VASCULAR 1. Two low-density collections in the upper abdomen. Largest collection is in left upper quadrant measuring up to 7.8 cm. The other fluid collection is medial to the pancreatic head  and measures up to 5.3 cm. Findings are suggestive for pseudocyst collections. No evidence for gas within the pseudocysts. Diffuse pancreatic calcifications are compatible with old pancreatic inflammation. 2. Small to moderate sized bilateral pleural effusions with compressive atelectasis in both lower lobes. Cannot exclude parenchymal disease with pneumonia or aspiration in the right lower lobe. 3. Cardiomegaly with enlargement of the right heart. Findings are suggestive for increased right heart pressures. 4. Limited evaluation of the bowel structures. Fluid-filled loops of small bowel in the right abdomen are nonspecific. Large amount of stool particularly in the distal colon. No evidence for bowel obstruction. Electronically Signed   By: Markus Daft M.D.   On: 04/08/2020 12:14       Subjective: Feels well.  Denies any shortness of breath.  His breathing is at baseline.  Mentation is well.  Discharge Exam: Vitals:   04/22/20 0837 04/22/20 1325  BP: 139/74   Pulse: 72 73  Resp: 16   Temp: 97.8 F (36.6 C)   SpO2: 100% 92%   Vitals:   04/22/20 0113 04/22/20 0454  04/22/20 0837 04/22/20 1325  BP: 118/70 121/70 139/74   Pulse: 73 71 72 73  Resp: 18 20 16    Temp: 97.9 F (36.6 C) 98.1 F (36.7 C) 97.8 F (36.6 C)   TempSrc: Oral  Oral   SpO2: 98% 98% 100% 92%  Weight:      Height:        General: Pt is alert, awake, not in acute distress Cardiovascular: RRR, S1/S2 +, no rubs, no gallops Respiratory: CTA bilaterally, no wheezing, no rhonchi Abdominal: Soft, NT, ND, bowel sounds + Extremities: no edema, no cyanosis Alert oriented x3.  Grossly intact    The results of significant diagnostics from this hospitalization (including imaging, microbiology, ancillary and laboratory) are listed below for reference.     Microbiology: Recent Results (from the past 240 hour(s))  Resp Panel by RT-PCR (Flu A&B, Covid) Nasopharyngeal Swab     Status: None   Collection Time: 04/20/20  7:30  AM   Specimen: Nasopharyngeal Swab; Nasopharyngeal(NP) swabs in vial transport medium  Result Value Ref Range Status   SARS Coronavirus 2 by RT PCR NEGATIVE NEGATIVE Final    Comment: (NOTE) SARS-CoV-2 target nucleic acids are NOT DETECTED.  The SARS-CoV-2 RNA is generally detectable in upper respiratory specimens during the acute phase of infection. The lowest concentration of SARS-CoV-2 viral copies this assay can detect is 138 copies/mL. A negative result does not preclude SARS-Cov-2 infection and should not be used as the sole basis for treatment or other patient management decisions. A negative result may occur with  improper specimen collection/handling, submission of specimen other than nasopharyngeal swab, presence of viral mutation(s) within the areas targeted by this assay, and inadequate number of viral copies(<138 copies/mL). A negative result must be combined with clinical observations, patient history, and epidemiological information. The expected result is Negative.  Fact Sheet for Patients:  EntrepreneurPulse.com.au  Fact Sheet for Healthcare Providers:  IncredibleEmployment.be  This test is no t yet approved or cleared by the [place] FDA and  has been authorized for detection and/or diagnosis of SARS-CoV-2 by FDA under an Emergency Use Authorization (EUA). This EUA will remain  in effect (meaning this test can be used) for the duration of the COVID-19 declaration under Section 564(b)(1) of the Act, 21 U.S.C.section 360bbb-3(b)(1), unless the authorization is terminated  or revoked sooner.       Influenza A by PCR NEGATIVE NEGATIVE Final   Influenza B by PCR NEGATIVE NEGATIVE Final    Comment: (NOTE) The Xpert Xpress SARS-CoV-2/FLU/RSV plus assay is intended as an aid in the diagnosis of influenza from Nasopharyngeal swab specimens and should not be used as a sole basis for treatment. Nasal washings and aspirates are  unacceptable for Xpert Xpress SARS-CoV-2/FLU/RSV testing.  Fact Sheet for Patients: EntrepreneurPulse.com.au  Fact Sheet for Healthcare Providers: IncredibleEmployment.be  This test is not yet approved or cleared by the [place] FDA and has been authorized for detection and/or diagnosis of SARS-CoV-2 by FDA under an Emergency Use Authorization (EUA). This EUA will remain in effect (meaning this test can be used) for the duration of the COVID-19 declaration under Section 564(b)(1) of the Act, 21 U.S.C. section 360bbb-3(b)(1), unless the authorization is terminated or revoked.  Performed at [facility], [address] [place], [place] [address]   Blood Culture (routine x 2)     Status: None (Preliminary result)   Collection Time: 04/20/20  8:14 AM   Specimen: BLOOD  Result Value Ref Range Status  Specimen Description BLOOD LEFT ARM  Final   Special Requests   Final    BOTTLES DRAWN AEROBIC AND ANAEROBIC Blood Culture adequate volume   Culture   Final    NO GROWTH 2 DAYS Performed at [facility], [address], [place], [place] [address]    Report Status PENDING  Incomplete  Blood Culture (routine x 2)     Status: None (Preliminary result)   Collection Time: 04/20/20  8:15 AM   Specimen: BLOOD  Result Value Ref Range Status   Specimen Description BLOOD LEFT WRIST  Final   Special Requests   Final    BOTTLES DRAWN AEROBIC AND ANAEROBIC Blood Culture adequate volume   Culture   Final    NO GROWTH 2 DAYS Performed at [facility], [address] [place], [place] [address]    Report Status PENDING  Incomplete  MRSA PCR Screening     Status: None   Collection Time: 04/20/20  2:31 PM   Specimen: Nasal Mucosa; Nasopharyngeal  Result Value Ref Range Status   MRSA by PCR NEGATIVE NEGATIVE Final    Comment:        The GeneXpert MRSA Assay (FDA approved for NASAL specimens only), is one component of  a comprehensive MRSA colonization surveillance program. It is not intended to diagnose MRSA infection nor to guide or monitor treatment for MRSA infections. Performed at [facility], [address] [place], [place] [address]      Labs: BNP (last 3 results) Recent Labs    12/07/19 0639 01/09/20 0840 04/20/20 0729  BNP >4,500.0* >4,885.0* >5,885.0*   Basic Metabolic Panel: Recent Labs  Lab 04/20/20 0729 04/20/20 1822 04/21/20 0640  NA 136  --  138  K 4.9  --  3.9  CL 100  --  101  CO2 27  --  28  GLUCOSE 47*  --  137*  BUN 30*  --  20  CREATININE 3.59*  --  2.59*  CALCIUM 8.5*  --  8.1*  PHOS  --  2.8 3.1   Liver Function Tests: Recent Labs  Lab 04/20/20 0729  AST 39  ALT 41  ALKPHOS 348*  BILITOT 0.5  PROT 6.6  ALBUMIN 2.8*   No results for input(s): LIPASE, AMYLASE in the last 168 hours. No results for input(s): AMMONIA in the last 168 hours. CBC: Recent Labs  Lab 04/20/20 0729 04/21/20 0640 04/21/20 1426 04/22/20 0623  WBC 5.7 7.9  --  5.8  NEUTROABS 3.7  --   --   --   HGB 8.0* 6.4* 8.1* 7.8*  HCT 26.9* 21.6* 27.9* 25.2*  MCV 95.7 93.9  --  92.6  PLT 246 157  --  151   Cardiac Enzymes: No results for input(s): CKTOTAL, CKMB, CKMBINDEX, TROPONINI in the last 168 hours. BNP: Invalid input(s): POCBNP CBG: Recent Labs  Lab 04/21/20 0805 04/21/20 1645 04/21/20 2139 04/22/20 0746 04/22/20 1206  GLUCAP 132* 330* 254* 139* 252*   D-Dimer No results for input(s): DDIMER in the last 72 hours. Hgb A1c No results for input(s): HGBA1C in the last 72 hours. Lipid Profile No results for input(s): CHOL, HDL, LDLCALC, TRIG, CHOLHDL, LDLDIRECT in the last 72 hours. Thyroid function studies No results for input(s): TSH, T4TOTAL, T3FREE, THYROIDAB in the last 72 hours.  Invalid input(s): FREET3 Anemia work up No results for input(s): VITAMINB12, FOLATE, FERRITIN, TIBC, IRON, RETICCTPCT in the last 72 hours. Urinalysis    Component  Value Date/Time  COLORURINE YELLOW (A) 04/10/2020 0330   APPEARANCEUR HAZY (A) 04/10/2020 0330   APPEARANCEUR Clear 01/17/2014 1551   LABSPEC 1.017 04/10/2020 0330   LABSPEC 1.011 01/17/2014 1551   PHURINE 7.0 04/10/2020 0330   GLUCOSEU >=500 (A) 04/10/2020 0330   GLUCOSEU Negative 01/17/2014 1551   HGBUR NEGATIVE 04/10/2020 0330   BILIRUBINUR NEGATIVE 04/10/2020 0330   BILIRUBINUR Negative 01/17/2014 1551   KETONESUR NEGATIVE 04/10/2020 0330   PROTEINUR >=300 (A) 04/10/2020 0330   NITRITE NEGATIVE 04/10/2020 0330   LEUKOCYTESUR NEGATIVE 04/10/2020 0330   LEUKOCYTESUR 1+ 01/17/2014 1551   Sepsis Labs Invalid input(s): PROCALCITONIN,  WBC,  LACTICIDVEN Microbiology Recent Results (from the past 240 hour(s))  Resp Panel by RT-PCR (Flu A&B, Covid) Nasopharyngeal Swab     Status: None   Collection Time: 04/20/20  7:30 AM   Specimen: Nasopharyngeal Swab; Nasopharyngeal(NP) swabs in vial transport medium  Result Value Ref Range Status   SARS Coronavirus 2 by RT PCR NEGATIVE NEGATIVE Final    Comment: (NOTE) SARS-CoV-2 target nucleic acids are NOT DETECTED.  The SARS-CoV-2 RNA is generally detectable in upper respiratory specimens during the acute phase of infection. The lowest concentration of SARS-CoV-2 viral copies this assay can detect is 138 copies/mL. A negative result does not preclude SARS-Cov-2 infection and should not be used as the sole basis for treatment or other patient management decisions. A negative result may occur with  improper specimen collection/handling, submission of specimen other than nasopharyngeal swab, presence of viral mutation(s) within the areas targeted by this assay, and inadequate number of viral copies(<138 copies/mL). A negative result must be combined with clinical observations, patient history, and epidemiological information. The expected result is Negative.  Fact Sheet for Patients:  EntrepreneurPulse.com.au  Fact Sheet  for Healthcare Providers:  IncredibleEmployment.be  This test is no t yet approved or cleared by the [place] FDA and  has been authorized for detection and/or diagnosis of SARS-CoV-2 by FDA under an Emergency Use Authorization (EUA). This EUA will remain  in effect (meaning this test can be used) for the duration of the COVID-19 declaration under Section 564(b)(1) of the Act, 21 U.S.C.section 360bbb-3(b)(1), unless the authorization is terminated  or revoked sooner.       Influenza A by PCR NEGATIVE NEGATIVE Final   Influenza B by PCR NEGATIVE NEGATIVE Final    Comment: (NOTE) The Xpert Xpress SARS-CoV-2/FLU/RSV plus assay is intended as an aid in the diagnosis of influenza from Nasopharyngeal swab specimens and should not be used as a sole basis for treatment. Nasal washings and aspirates are unacceptable for Xpert Xpress SARS-CoV-2/FLU/RSV testing.  Fact Sheet for Patients: EntrepreneurPulse.com.au  Fact Sheet for Healthcare Providers: IncredibleEmployment.be  This test is not yet approved or cleared by the [place] FDA and has been authorized for detection and/or diagnosis of SARS-CoV-2 by FDA under an Emergency Use Authorization (EUA). This EUA will remain in effect (meaning this test can be used) for the duration of the COVID-19 declaration under Section 564(b)(1) of the Act, 21 U.S.C. section 360bbb-3(b)(1), unless the authorization is terminated or revoked.  Performed at [facility], [address] [place], [place] [address]   Blood Culture (routine x 2)     Status: None (Preliminary result)   Collection Time: 04/20/20  8:14 AM   Specimen: BLOOD  Result Value Ref Range Status   Specimen Description BLOOD LEFT ARM  Final   Special Requests   Final    BOTTLES DRAWN AEROBIC AND ANAEROBIC Blood Culture  adequate volume   Culture   Final    NO GROWTH 2 DAYS Performed at [facility],  [address] [place], [place] [address]    Report Status PENDING  Incomplete  Blood Culture (routine x 2)     Status: None (Preliminary result)   Collection Time: 04/20/20  8:15 AM   Specimen: BLOOD  Result Value Ref Range Status   Specimen Description BLOOD LEFT WRIST  Final   Special Requests   Final    BOTTLES DRAWN AEROBIC AND ANAEROBIC Blood Culture adequate volume   Culture   Final    NO GROWTH 2 DAYS Performed at [facility], [address], [place], [place] [address]    Report Status PENDING  Incomplete  MRSA PCR Screening     Status: None   Collection Time: 04/20/20  2:31 PM   Specimen: Nasal Mucosa; Nasopharyngeal  Result Value Ref Range Status   MRSA by PCR NEGATIVE NEGATIVE Final    Comment:        The GeneXpert MRSA Assay (FDA approved for NASAL specimens only), is one component of a comprehensive MRSA colonization surveillance program. It is not intended to diagnose MRSA infection nor to guide or monitor treatment for MRSA infections. Performed at [facility], [address], [place], [place] [address]      Time coordinating discharge: Over 30 minutes  SIGNED:   Nolberto Hanlon, MD  [facility] 04/22/2020, 2:21 PM Pager   If 7PM-7AM, please contact night-coverage

## 2020-04-22 NOTE — Evaluation (Signed)
Physical Therapy Evaluation Patient Details Name: [name] MRN: [id number] DOB: October 16, 1960 Today's Date: 04/22/2020   History of Present Illness  [name] is a 45yoM who comes to [facility] on 12/8 for AMS, pt brought in by EMS due to hypoglyecmia. PMH: end-stage renal disease on hemodialysis (dialysis days are T/TH/S), insulin-dependent diabetes mellitus, hypertension, CVA, GERD. Pt was hypoglyemic adn hypoxic. Pt noted to aspiration PNA upon admission.  Clinical Impression  Pt admitted with above diagnosis. Pt currently with functional limitations due to the deficits listed below (see "PT Problem List"). Upon entry, pt in bed, awake and agreeable to participate. The pt is alert and oriented x1, pleasant, conversational, and generally a fair historian, although pt familiar to author from multiple prior visits. Min-modA for bed mobility and transfers. Pt asks to sit on BSC for BM, no AMB performed due to desat to 92% transferring on 6L/min. Functional mobility assessment demonstrates increased effort/time requirements, poor tolerance, and need for physical assistance, whereas the patient performed these at a higher level of independence PTA. Pt would benefit from STR at DC, but he is not in favor of this, desires to return to home. Pt will benefit from skilled PT intervention to increase independence and safety with basic mobility in preparation for discharge to the venue listed below.       Follow Up Recommendations SNF;Other (comment) (pt not agreeable to recommendations, would prefer DC to home)    Equipment Recommendations  None recommended by PT    Recommendations for Other Services       Precautions / Restrictions Precautions Precautions: Fall Restrictions Weight Bearing Restrictions: No      Mobility  Bed Mobility Overal bed mobility: Needs Assistance Bed Mobility: Supine to Sit Rolling: Min assist   Supine to sit: Min assist          Transfers Overall transfer  level: Needs assistance Equipment used: None Transfers: Stand Pivot Transfers;Sit to/from Stand Sit to Stand: Min assist Stand pivot transfers: Min guard       General transfer comment: heavy effort, quite weak, poor safety awareness  Ambulation/Gait                Stairs            Wheelchair Mobility    Modified Rankin (Stroke Patients Only)       Balance Overall balance assessment: Needs assistance Sitting-balance support: No upper extremity supported Sitting balance-Leahy Scale: Good Sitting balance - Comments: pt refusal       Standing balance comment: pt refusal                             Pertinent Vitals/Pain Pain Assessment: No/denies pain Faces Pain Scale: No hurt    Home Living Family/patient expects to be discharged to:: Private residence Living Arrangements: Spouse/significant other Available Help at Discharge: Family;Available PRN/intermittently Type of Home: House Home Access: Level entry     Home Layout: One level Home Equipment: Grab bars - tub/shower;Grab bars - toilet;Cane - single point;Walker - 2 wheels      Prior Function Level of Independence: Independent with assistive device(s)         Comments: Pt reports he was independent with use of RW for all aspects of care. Pt is likely poor historian and no family present to confirm baseline or home set up     Hand Dominance   Dominant Hand: Left    Extremity/Trunk Assessment  Upper Extremity Assessment Upper Extremity Assessment: Generalized weakness    Lower Extremity Assessment Lower Extremity Assessment: Generalized weakness       Communication   Communication: Other (comment) (Pt with very soft voice and needing increased time to process)  Cognition Arousal/Alertness: Awake/alert Behavior During Therapy: WFL for tasks assessed/performed Overall Cognitive Status: Within Functional Limits for tasks assessed                                  General Comments: Pt unable to verbalize month. Pt verbalized year as "2002". Pt with very slow processing and needing increased time this session.      General Comments      Exercises     Assessment/Plan    PT Assessment Patient needs continued PT services  PT Problem List Decreased strength;Decreased activity tolerance;Decreased balance;Decreased mobility       PT Treatment Interventions Gait training;Therapeutic activities;Therapeutic exercise;Stair training;Functional mobility training;Patient/family education    PT Goals (Current goals can be found in the Care Plan section)  Acute Rehab PT Goals Patient Stated Goal: regain strength, prevent decline while admitted PT Goal Formulation: With patient Time For Goal Achievement: 05/06/20 Potential to Achieve Goals: Fair    Frequency Min 2X/week   Barriers to discharge Decreased caregiver support wife works    Co-evaluation               AM-PAC PT "6 Clicks" Mobility  Outcome Measure Help needed turning from your back to your side while in a flat bed without using bedrails?: A Lot Help needed moving from lying on your back to sitting on the side of a flat bed without using bedrails?: A Lot Help needed moving to and from a bed to a chair (including a wheelchair)?: A Lot Help needed standing up from a chair using your arms (e.g., wheelchair or bedside chair)?: A Lot Help needed to walk in hospital room?: A Lot Help needed climbing 3-5 steps with a railing? : A Lot 6 Click Score: 12    End of Session Equipment Utilized During Treatment: Oxygen Activity Tolerance: Patient tolerated treatment well Patient left: in chair;with nursing/sitter in room;with call bell/phone within reach Nurse Communication: Mobility status PT Visit Diagnosis: Unsteadiness on feet (R26.81);Muscle weakness (generalized) (M62.81);History of falling (Z91.81);Difficulty in walking, not elsewhere classified (R26.2)    Time: 0626-9485 PT  Time Calculation (min) (ACUTE ONLY): 11 min   Charges:   PT Evaluation $PT Eval Low Complexity: 1 Low          2:09 PM, 04/22/20 Etta Grandchild, PT, DPT Physical Therapist - [facility]  [phone] ([place])    Lucious Zou C 04/22/2020, 2:07 PM

## 2020-04-22 NOTE — Progress Notes (Signed)
[name]  A and O x 4 VSS. Pt tolerating diet well. No complaints of pain or nausea. IV removed intact, prescriptions given. Pt voices understanding of discharge instructions with no further questions. Pt discharged via wheelchair, wife picked up.   Allergies as of 04/22/2020      Reactions   Ferrous Gluconate Nausea And Vomiting   Other       Medication List    STOP taking these medications   insulin lispro 100 UNIT/ML KwikPen Commonly known as: HumaLOG KwikPen     TAKE these medications   amoxicillin-clavulanate 500-125 MG tablet Commonly known as: AUGMENTIN Take 1 tablet (500 mg total) by mouth daily for 2 days. Start taking on: April 23, 2020   aspirin 81 MG EC tablet Take 1 tablet (81 mg total) by mouth daily.   carvedilol 6.25 MG tablet Commonly known as: COREG Take 6.25 mg by mouth 2 (two) times daily with a meal.   Entresto 24-26 MG Generic drug: sacubitril-valsartan Take 1 tablet by mouth 2 (two) times daily.   epoetin alfa 10000 UNIT/ML injection Commonly known as: EPOGEN Inject 0.4 mLs (4,000 Units total) into the vein Every Tuesday,Thursday,and Saturday with dialysis.   feeding supplement (NEPRO CARB STEADY) Liqd Take 237 mLs by mouth 3 (three) times daily between meals.   folic acid 1 MG tablet Commonly known as: FOLVITE Take 1 mg by mouth daily.   hydrALAZINE 25 MG tablet Commonly known as: APRESOLINE Take 1 tablet (25 mg total) by mouth in the morning and at bedtime.   iron polysaccharides 150 MG capsule Commonly known as: NIFEREX Take 1 capsule (150 mg total) by mouth daily.   Lantus SoloStar 100 UNIT/ML Solostar Pen Generic drug: insulin glargine Inject 2 Units into the skin daily. What changed: how much to take   lidocaine-prilocaine cream Commonly known as: EMLA Apply 1 application topically as directed.   Melatonin 5 MG Caps Take 5 mg by mouth at bedtime.   mirtazapine 7.5 MG tablet Commonly known as: REMERON Take 7.5 mg by  mouth at bedtime.   multivitamin Tabs tablet Take 1 tablet by mouth daily.   sertraline 100 MG tablet Commonly known as: ZOLOFT Take 1.5 tablets (150 mg total) by mouth daily.   thiamine 100 MG tablet Commonly known as: VITAMIN B-1 Take 100 mg by mouth daily.       Vitals:   04/22/20 1325 04/22/20 1600  BP:  113/73  Pulse: 73 67  Resp:  18  Temp:    SpO2: 92% 100%    [name]

## 2020-04-22 NOTE — Progress Notes (Signed)
Inpatient Diabetes Program Recommendations  AACE/ADA: New Consensus Statement on Inpatient Glycemic Control (2015)  Target Ranges:  Prepandial:   less than 140 mg/dL      Peak postprandial:   less than 180 mg/dL (1-2 hours)      Critically ill patients:  140 - 180 mg/dL   Lab Results  Component Value Date   GLUCAP 252 (H) 04/22/2020   HGBA1C 6.5 (H) 04/09/2020    Review of Glycemic Control Results for [name], [name] (MRN [id number]) as of 04/22/2020 13:03  Ref. Range 04/21/2020 08:05 04/21/2020 16:45 04/21/2020 21:39 04/22/2020 07:46 04/22/2020 12:06  Glucose-Capillary Latest Ref Range: 70 - 99 mg/dL 132 (H) 330 (H) 254 (H) 139 (H) 252 (H)   Diabetes history: DM 2 Outpatient Diabetes medications:  Lantus 4 units daily, Humalog 3 units tid with meals Current orders for Inpatient glycemic control:  Novolog sensitive tid with meals and HS  Inpatient Diabetes Program Recommendations:    See's Endocrinologist, Dr. Gabriel Carina.  Needs to f/u with her since blood sugars have been so erratic.  Consider reducing home dose of Humalog to 2 units daily and only give it blood sugar >150 mg/dL.  Recommend f/u with Dr. Gabriel Carina ASAP and more liberal glucose goals in order to avoid low blood sugars.  A1C=6.5% which is very low considering he is ESRD and has frequent hypoglycemia.    Attempted to call patients wife however there was no answer.  Also called and spoke to patient regarding home insulin.  He states that wife gives insulin to him and states he is eating fairly well at home.  Watch.   Thanks  Adah Perl, RN, BC-ADM Inpatient Diabetes Coordinator Pager [phone] (8a-5p)

## 2020-04-22 NOTE — Progress Notes (Signed)
Dr. Kurtis Bushman made aware of critical lactic acid.

## 2020-04-22 NOTE — Care Management Important Message (Signed)
Important Message  Patient Details  Name: [name] MRN: [id number] Date of Birth: 01-28-1961   Medicare Important Message Given:  Yes     Dannette Barbara 04/22/2020, 1:39 PM

## 2020-04-22 NOTE — Evaluation (Signed)
Clinical/Bedside Swallow Evaluation Patient Details  Name: [name] MRN: [id number] Date of Birth: [birth year]  Today's Date: 04/22/2020 Time: SLP Start Time (ACUTE ONLY): 0930 SLP Stop Time (ACUTE ONLY): 1030 SLP Time Calculation (min) (ACUTE ONLY): 60 min  Past Medical History:  Past Medical History:  Diagnosis Date  . Chronic kidney disease   . Diabetes mellitus without complication ([place])   . ETOH abuse   . GERD (gastroesophageal reflux disease)   . Hyperlipidemia   . Hypertension   . Stroke [facility])    Past Surgical History:  Past Surgical History:  Procedure Laterality Date  . AMPUTATION TOE Left 12/11/2019   Procedure: AMPUTATION LEFT 2nd TOE;  Surgeon: Sharlotte Alamo, DPM;  Location: [facility] ORS;  Service: Podiatry;  Laterality: Left;  . AV FISTULA PLACEMENT Right 11/25/2019   Procedure: INSERTION OF ARTERIOVENOUS (AV) GORE-TEX GRAFT ARM;  Surgeon: Katha Cabal, MD;  Location: [facility] ORS;  Service: Vascular;  Laterality: Right;  . COLONOSCOPY WITH PROPOFOL N/A 03/06/2019   Procedure: COLONOSCOPY WITH PROPOFOL;  Surgeon: Lucilla Lame, MD;  Location: [facility] ENDOSCOPY;  Service: Endoscopy;  Laterality: N/A;  . DIALYSIS/PERMA CATHETER INSERTION N/A 04/10/2019   Procedure: DIALYSIS/PERMA CATHETER INSERTION;  Surgeon: Serafina Mitchell, MD;  Location: [place] CV LAB;  Service: Cardiovascular;  Laterality: N/A;  . DIALYSIS/PERMA CATHETER REMOVAL N/A 03/07/2020   Procedure: DIALYSIS/PERMA CATHETER REMOVAL;  Surgeon: Algernon Huxley, MD;  Location: [place] CV LAB;  Service: Cardiovascular;  Laterality: N/A;  . ESOPHAGOGASTRODUODENOSCOPY (EGD) WITH PROPOFOL N/A 07/01/2016   Procedure: ESOPHAGOGASTRODUODENOSCOPY (EGD) WITH PROPOFOL;  Surgeon: San Jetty, MD;  Location: [facility] ENDOSCOPY;  Service: General;  Laterality: N/A;  . ESOPHAGOGASTRODUODENOSCOPY (EGD) WITH PROPOFOL N/A 12/25/2019   Procedure: ESOPHAGOGASTRODUODENOSCOPY (EGD) WITH PROPOFOL;  Surgeon: Lin Landsman, MD;   Location: [facility] ENDOSCOPY;  Service: Gastroenterology;  Laterality: N/A;  . LOWER EXTREMITY ANGIOGRAPHY Left 12/10/2019   Procedure: Lower Extremity Angiography;  Surgeon: Algernon Huxley, MD;  Location: [place] CV LAB;  Service: Cardiovascular;  Laterality: Left;   HPI:  Pt  is a 59 y.o. male with medical history significant for ETOH and Tobacco use, esophagitis/gastritis per chart, end-stage renal disease on hemodialysis (dialysis days are T/TH/S), insulin-dependent diabetes mellitus, hypertension, CVA, GERD who was brought into the ER by EMS for evaluation of hypoglycemia.  Patient's wife states that after supper last night his blood sugar was 138 and so she offered him a bedtime snack and he went to bed.  She did not administer any insulin to him last night.  She found him unresponsive this morning prior to leaving the house with agonal breathing he checked his blood sugar and it was 31 and so she called EMS because she was unable to give him any sugar replacement orally.  When EMS arrived patient was noted to have a blood sugar of 35 and they administered 1 mg of glucagon IM.  Upon arrival to the ER he was noted to have blood sugar of 46 and received 1 amp of D50.  Labs show sodium 136, potassium 4.9, chloride 100, bicarb 27, glucose 47, BUN 30, creatinine 2.59.  Right lung infiltrate with background of interstitial edema. Small  left pleural effusion.  was recently discharged from the hospital following hospitalization for recurrent hypoglycemia. Off BiPAP and Bair hugger.   Assessment / Plan / Recommendation Clinical Impression  Pt appears to present w/ adequate oropharyngeal phase swallow function w/ No oropharyngeal phase dysphagia noted, No neuromuscular deficits noted. Pt consumed po  trials w/ No overt, clinical s/s of aspiration during po trials. Pt appears at reduced risk for aspiration following general aspiration precautions. However, pt is Edentulous at baseline. He says he eats w/ them at  home but then stated no one could bring his Dentures to the hospital and that he didn't want them brought in. Pt also stated he did not eat much in the way of meals at home Baseline. Pt appeared to need min cues d/t lack of engagement during session; flat affect. Unsure of his Baseline Cognitive engagement at home. Noted need for verbal cues for follow through during previous assessment(s) per ST notes at previous admission.  During po trials, pt consumed all consistencies w/ no overt coughing, decline in vocal quality, or change in respiratory presentation during/post trials. Oral phase appeared [facility] w/ timely bolus management and control of bolus propulsion for A-P transfer for swallowing of liquids and purees. Min increased Time needed for gumming/mashing of foods of increased texture but he was able to clear the trials given Time. Oral clearing achieved w/ all trial consistencies. OM Exam appeared grossly [facility] w/ no unilateral weakness noted. Speech Soft but Clear when he gave effort. Pt fed self w/ setup support. Recommend a more Minced diet consistency diet w/ moistened foods; Thin liquids w/ monitoring of straw use. Recommend general aspiration precautions, Pills WHOLE in Puree for safer, easier swallowing if needed. Pt needs support and direction to sit Upright for all oral intake - encouragement, cues as needed. Education given on Pills in Puree; food consistencies and easy to eat options; general aspiration precautions. REFLUX/GERD precautions d/t h/o of Esophageal issues. NSG to reconsult if any new needs arise. NSG agreed. MD updated on above, recs. SLP Visit Diagnosis: Dysphagia, unspecified (R13.10) (Edentulous)    Aspiration Risk  Mild aspiration risk;Risk for inadequate nutrition/hydration (reduced when following general precautions)    Diet Recommendation  Dysphagia level 2 (MINCED FOODS moistened) w/ Thin liquids. General aspiration precautions, Reflux precautions.  When pt is able to use his  Dentures again, he should be able to return to regular/solid foods.  Medication Administration: Whole meds with puree (if needed for ease of swallowing)    Other  Recommendations Recommended Consults:  (Dietician f/u) Oral Care Recommendations: Oral care BID;Oral care before and after PO;Patient independent with oral care;Staff/trained caregiver to provide oral care Other Recommendations:  (n/a)   Follow up Recommendations None      Frequency and Duration  (n/a)   (n/a)       Prognosis Prognosis for Safe Diet Advancement: Fair (-Good) Barriers to Reach Goals: Behavior;Severity of deficits;Time post onset      Swallow Study   General Date of Onset: 04/20/20 HPI: Pt  is a 59 y.o. male with medical history significant for ETOH and Tobacco use, end-stage renal disease on hemodialysis (dialysis days are T/TH/S), insulin-dependent diabetes mellitus, hypertension, CVA, GERD who was brought into the ER by EMS for evaluation of hypoglycemia.  Patient's wife states that after supper last night his blood sugar was 138 and so she offered him a bedtime snack and he went to bed.  She did not administer any insulin to him last night.  She found him unresponsive this morning prior to leaving the house with agonal breathing he checked his blood sugar and it was 31 and so she called EMS because she was unable to give him any sugar replacement orally.  When EMS arrived patient was noted to have a blood sugar of 35 and  they administered 1 mg of glucagon IM.  Upon arrival to the ER he was noted to have blood sugar of 46 and received 1 amp of D50.  Labs show sodium 136, potassium 4.9, chloride 100, bicarb 27, glucose 47, BUN 30, creatinine 2.59.  Right lung infiltrate with background of interstitial edema. Small  left pleural effusion.  was recently discharged from the hospital following hospitalization for recurrent hypoglycemia. Off BiPAP and Bair hugger. Type of Study: Bedside Swallow Evaluation Previous  Swallow Assessment: 06/2019, 12/2019 Diet Prior to this Study: Dysphagia 2 (chopped);Thin liquids (edentulous) Temperature Spikes Noted: No (wbc 5.8) Respiratory Status: Nasal cannula (6L) History of Recent Intubation: No Behavior/Cognition: Alert;Cooperative;Pleasant mood;Requires cueing (quiet) Oral Cavity Assessment: Within Functional Limits Oral Care Completed by SLP: Yes Oral Cavity - Dentition: Edentulous (states he wears dentures at home) Vision: Functional for self-feeding Self-Feeding Abilities: Able to feed self;Needs set up;Needs assist Patient Positioning: Upright in bed (needed positioning) Baseline Vocal Quality: Low vocal intensity (not much effort) Volitional Cough: Strong;Congested Volitional Swallow: Able to elicit    Oral/Motor/Sensory Function Overall Oral Motor/Sensory Function: Within functional limits   Ice Chips Ice chips: Within functional limits Presentation: Spoon (fed; 2 trials)   Thin Liquid Thin Liquid: Within functional limits Presentation: Cup;Self Fed;Straw (6 trials each)    Nectar Thick Nectar Thick Liquid: Not tested   Honey Thick Honey Thick Liquid: Not tested   Puree Puree: Within functional limits Presentation: Self Fed;Spoon (~2-3 ozs)   Solid     Solid: Impaired Presentation: Self Fed;Spoon (4 trials accepted) Oral Phase Impairments: Impaired mastication (increased time -- edentulous) Oral Phase Functional Implications: Impaired mastication Pharyngeal Phase Impairments:  (none)       [name], MS, Development worker, international aid Language Pathologist Rehab Services [phone] Clif Serio 04/22/2020,11:04 AM

## 2020-04-23 LAB — TYPE AND SCREEN
ABO/RH(D): A POS
Antibody Screen: NEGATIVE
Unit division: 0

## 2020-04-23 LAB — BPAM RBC
Blood Product Expiration Date: 202201082359
ISSUE DATE / TIME: 202112091208
Unit Type and Rh: 6200

## 2020-04-25 ENCOUNTER — Telehealth (HOSPITAL_COMMUNITY): Payer: Self-pay

## 2020-04-25 LAB — CULTURE, BLOOD (ROUTINE X 2)
Culture: NO GROWTH
Culture: NO GROWTH
Special Requests: ADEQUATE
Special Requests: ADEQUATE

## 2020-04-25 NOTE — Telephone Encounter (Signed)
Today contacted [place] wife to make sure she picked up his antibiotic from his discharge.  She said she was not aware of it.  She noticed the Lantus change.  Advised her there is prescription for Augmentin at pharmacy ready to be picked up.  She states will pick up today and start it.  He had no other changes in his med, his med boxes still should be good.  She states she has an aid coming in still to help.  [facility] could not get any home health to accept him.  She states been doing ok during the weekend.  Will continue to visit for heart failure.   [place] [phone]

## 2020-04-26 ENCOUNTER — Emergency Department
Admission: EM | Admit: 2020-04-26 | Discharge: 2020-04-26 | Disposition: A | Discharge status: Home / Self Care | Payer: Medicare Other | Attending: Emergency Medicine | Admitting: Emergency Medicine

## 2020-04-26 ENCOUNTER — Encounter: Payer: Self-pay | Admitting: Emergency Medicine

## 2020-04-26 ENCOUNTER — Other Ambulatory Visit: Payer: Self-pay

## 2020-04-26 ENCOUNTER — Emergency Department: Payer: Medicare Other

## 2020-04-26 DIAGNOSIS — Z79899 Other long term (current) drug therapy: Secondary | ICD-10-CM | POA: Insufficient documentation

## 2020-04-26 DIAGNOSIS — E162 Hypoglycemia, unspecified: Secondary | ICD-10-CM | POA: Diagnosis present

## 2020-04-26 DIAGNOSIS — N186 End stage renal disease: Secondary | ICD-10-CM | POA: Diagnosis not present

## 2020-04-26 DIAGNOSIS — Z5900 Homelessness unspecified: Secondary | ICD-10-CM | POA: Insufficient documentation

## 2020-04-26 DIAGNOSIS — R6889 Other general symptoms and signs: Secondary | ICD-10-CM

## 2020-04-26 DIAGNOSIS — Z7982 Long term (current) use of aspirin: Secondary | ICD-10-CM | POA: Diagnosis not present

## 2020-04-26 DIAGNOSIS — F17290 Nicotine dependence, other tobacco product, uncomplicated: Secondary | ICD-10-CM | POA: Insufficient documentation

## 2020-04-26 DIAGNOSIS — E1165 Type 2 diabetes mellitus with hyperglycemia: Secondary | ICD-10-CM | POA: Insufficient documentation

## 2020-04-26 DIAGNOSIS — I5023 Acute on chronic systolic (congestive) heart failure: Secondary | ICD-10-CM | POA: Insufficient documentation

## 2020-04-26 DIAGNOSIS — E1110 Type 2 diabetes mellitus with ketoacidosis without coma: Secondary | ICD-10-CM | POA: Diagnosis not present

## 2020-04-26 DIAGNOSIS — Z992 Dependence on renal dialysis: Secondary | ICD-10-CM | POA: Diagnosis not present

## 2020-04-26 DIAGNOSIS — E119 Type 2 diabetes mellitus without complications: Secondary | ICD-10-CM | POA: Diagnosis not present

## 2020-04-26 DIAGNOSIS — E1122 Type 2 diabetes mellitus with diabetic chronic kidney disease: Secondary | ICD-10-CM | POA: Diagnosis not present

## 2020-04-26 DIAGNOSIS — I132 Hypertensive heart and chronic kidney disease with heart failure and with stage 5 chronic kidney disease, or end stage renal disease: Secondary | ICD-10-CM | POA: Diagnosis not present

## 2020-04-26 LAB — BASIC METABOLIC PANEL
Anion gap: 12 (ref 5–15)
BUN: 19 mg/dL (ref 6–20)
CO2: 26 mmol/L (ref 22–32)
Calcium: 7.7 mg/dL — ABNORMAL LOW (ref 8.9–10.3)
Chloride: 102 mmol/L (ref 98–111)
Creatinine, Ser: 3.31 mg/dL — ABNORMAL HIGH (ref 0.61–1.24)
GFR, Estimated: 20 mL/min — ABNORMAL LOW (ref >60)
Glucose, Bld: 117 mg/dL — ABNORMAL HIGH (ref 70–99)
Potassium: 4 mmol/L (ref 3.5–5.1)
Sodium: 140 mmol/L (ref 135–145)

## 2020-04-26 LAB — CBC
HCT: 30 % — ABNORMAL LOW (ref 39.0–52.0)
Hemoglobin: 9.1 g/dL — ABNORMAL LOW (ref 13.0–17.0)
MCH: 28 pg (ref 26.0–34.0)
MCHC: 30.3 g/dL (ref 30.0–36.0)
MCV: 92.3 fL (ref 80.0–100.0)
Platelets: 171 10*3/uL (ref 150–400)
RBC: 3.25 MIL/uL — ABNORMAL LOW (ref 4.22–5.81)
RDW: 18.9 % — ABNORMAL HIGH (ref 11.5–15.5)
WBC: 4.2 10*3/uL (ref 4.0–10.5)
nRBC: 0 % (ref 0.0–0.2)

## 2020-04-26 LAB — CBG MONITORING, ED: Glucose-Capillary: 95 mg/dL (ref 70–99)

## 2020-04-26 NOTE — ED Notes (Addendum)
Wife; [name] [phone]; called; states that she did not "put him out" and was not aware that he was coming to the ED for this. States that he is more than welcome to come home, and when she gets off work she will come up here.   Pt states that he came to the ED because his wife "put him out:" and is homeless. Pt has cough, states that he was just admitted for same and has not picked up abx yet.

## 2020-04-26 NOTE — ED Triage Notes (Signed)
ARrives to ED stating blood sugar was low this morning.  It was 32 according to patient.  Patient AAOx3.  Skin warm and dry. NAD

## 2020-04-26 NOTE — ED Provider Notes (Signed)
[name] Provider Note   ____________________________________________   I have reviewed the triage vital signs and the nursing notes.   HISTORY  Chief Complaint Want therapy  History limited by: poor historian   HPI [name] is a 59 y.o. male who presents to the emergency department today because, he tells me, he would like therapy.  He states he has had issues with his right arm and right leg.  He says he has had therapy with physical therapist in the past multiple times for these issues and he feels like they have not helped.  He alternatively told triage nurse that he was here for low blood sugar although he did not complain of this to me.  Additionally he further told triage nurse that he was kicked out of his house states it is now so most.  However nursing staff did contact his wife who stated that that is not true.  Patient did have a recent admission to the hospital I asked him how he was doing since that admission.  He denies any fevers or chest pain.  States his breathing is about the same.  He says he has been taking his medication.  He states he would like to go home.   Records reviewed. Per medical record review patient has a history of HLD, HTN, CVA.   Past Medical History:  Diagnosis Date  . Chronic kidney disease   . Diabetes mellitus without complication ([place])   . ETOH abuse   . GERD (gastroesophageal reflux disease)   . Hyperlipidemia   . Hypertension   . Stroke [facility])     Patient Active Problem List   Diagnosis Date Noted  . Aspiration pneumonia ([place]) 04/20/2020  . Acute respiratory failure ([place]) 04/20/2020  . Hypoglycemia due to insulin 04/08/2020  . Lactic acidosis 04/08/2020  . Anemia due to end stage renal disease ([place]) 04/08/2020  . Acute on chronic systolic CHF (congestive heart failure) ([place]) 01/10/2020  . Severe mitral regurgitation 01/10/2020  . Acute on chronic congestive heart failure ([place])   .  Acute on chronic respiratory failure with hypoxemia ([employer]) 12/29/2019  . Aspiration pneumonia of both lower lobes due to gastric secretions ([place]) 12/29/2019  . Diabetic foot infection ([place]) 12/07/2019  . Acute pulmonary edema ([place]) 12/07/2019  . Paronychia of second toe of left foot 11/09/2019  . Chronic osteomyelitis of toe of left foot ([place]) 11/09/2019  . DM (diabetes mellitus), type 2 with complications ([place]) 59/16/3846  . Hypoglycemia associated with diabetes ([place]) 08/24/2019  . Acute respiratory failure with hypoxia ([place])   . Acute metabolic encephalopathy 65/99/3570  . Hypertensive emergency 07/06/2019  . Acute encephalopathy 07/06/2019  . Seizures due to metabolic disorder ([place]) 17/79/3903  . Hyperosmolar hyperglycemic state (HHS) ([place]) 06/05/2019  . Symptomatic anemia 06/04/2019  . Frequent falls 06/04/2019  . History of CVA (cerebrovascular accident) 06/04/2019  . HCAP (healthcare-associated pneumonia) 06/04/2019  . Acute blood loss anemia 06/04/2019  . Hyperglycemia 05/28/2019  . Fall at home, initial encounter 05/28/2019  . Rib fracture 05/28/2019  . Depression 05/28/2019  . Hypokalemia 05/28/2019  . Weakness 05/27/2019  . ESRD on dialysis ([place])   . Acute renal failure (ARF) ([place]) 04/09/2019  . Polyp of ascending colon   . Diarrhea   . AKI (acute kidney injury) ([place]) 03/04/2019  . Acute kidney injury ([place]) 07/26/2018  . ARF (acute renal failure) ([place]) 07/25/2018  . Bilateral leg numbness 03/19/2018  . Numbness and tingling of both  feet 03/19/2018  . CVA (cerebral vascular accident) ([place]) 03/17/2018  . Moderate episode of recurrent major depressive disorder ([place]) 03/11/2018  . UTI (urinary tract infection) 02/27/2018  . Hypoglycemia 01/15/2018  . Type 2 diabetes mellitus without complication, with long-term current use of insulin ([place]) 08/27/2017  . Protein-calorie malnutrition, severe 08/19/2017  . Pancreatitis, acute 08/16/2017  . DKA (diabetic ketoacidoses)  08/16/2017  . HTN (hypertension) 08/16/2017  . HLD (hyperlipidemia) 08/16/2017  . Carotid stenosis 08/02/2016  . GERD (gastroesophageal reflux disease) 08/02/2016  . History of esophagogastroduodenoscopy (EGD) 07/01/2016  . History of recent blood transfusion 07/01/2016  . Acute renal failure superimposed on stage 4 chronic kidney disease ([place]) 07/01/2016  . Hyponatremia 07/01/2016  . Uncontrolled diabetes mellitus ([place]) 07/01/2016  . Alcohol abuse 07/01/2016  . Monilial esophagitis ([place]) 07/01/2016  . Tobacco abuse 07/01/2016  . Confusion 06/29/2016  . Iron deficiency anemia 06/29/2016  . Coagulopathy ([place]) 06/29/2016    Past Surgical History:  Procedure Laterality Date  . AMPUTATION TOE Left 12/11/2019   Procedure: AMPUTATION LEFT 2nd TOE;  Surgeon: Sharlotte Alamo, DPM;  Location: [facility] ORS;  Service: Podiatry;  Laterality: Left;  . AV FISTULA PLACEMENT Right 11/25/2019   Procedure: INSERTION OF ARTERIOVENOUS (AV) GORE-TEX GRAFT ARM;  Surgeon: Katha Cabal, MD;  Location: [facility] ORS;  Service: Vascular;  Laterality: Right;  . COLONOSCOPY WITH PROPOFOL N/A 03/06/2019   Procedure: COLONOSCOPY WITH PROPOFOL;  Surgeon: Lucilla Lame, MD;  Location: [facility] ENDOSCOPY;  Service: Endoscopy;  Laterality: N/A;  . DIALYSIS/PERMA CATHETER INSERTION N/A 04/10/2019   Procedure: DIALYSIS/PERMA CATHETER INSERTION;  Surgeon: Serafina Mitchell, MD;  Location: [place] CV LAB;  Service: Cardiovascular;  Laterality: N/A;  . DIALYSIS/PERMA CATHETER REMOVAL N/A 03/07/2020   Procedure: DIALYSIS/PERMA CATHETER REMOVAL;  Surgeon: Algernon Huxley, MD;  Location: [place] CV LAB;  Service: Cardiovascular;  Laterality: N/A;  . ESOPHAGOGASTRODUODENOSCOPY (EGD) WITH PROPOFOL N/A 07/01/2016   Procedure: ESOPHAGOGASTRODUODENOSCOPY (EGD) WITH PROPOFOL;  Surgeon: San Jetty, MD;  Location: [facility] ENDOSCOPY;  Service: General;  Laterality: N/A;  . ESOPHAGOGASTRODUODENOSCOPY (EGD) WITH PROPOFOL N/A 12/25/2019    Procedure: ESOPHAGOGASTRODUODENOSCOPY (EGD) WITH PROPOFOL;  Surgeon: Lin Landsman, MD;  Location: [facility] ENDOSCOPY;  Service: Gastroenterology;  Laterality: N/A;  . LOWER EXTREMITY ANGIOGRAPHY Left 12/10/2019   Procedure: Lower Extremity Angiography;  Surgeon: Algernon Huxley, MD;  Location: [place] CV LAB;  Service: Cardiovascular;  Laterality: Left;    Prior to Admission medications   Medication Sig Start Date End Date Taking? Authorizing Provider  aspirin EC 81 MG EC tablet Take 1 tablet (81 mg total) by mouth daily. 06/10/19   Jennye Boroughs, MD  carvedilol (COREG) 6.25 MG tablet Take 6.25 mg by mouth 2 (two) times daily with a meal.  10/28/19   [provider]  ENTRESTO 24-26 MG Take 1 tablet by mouth 2 (two) times daily. 07/29/19   [provider]  epoetin alfa (EPOGEN) 10000 UNIT/ML injection Inject 0.4 mLs (4,000 Units total) into the vein Every Tuesday,Thursday,and Saturday with dialysis. 01/05/20   Enzo Bi, MD  folic acid (FOLVITE) 1 MG tablet Take 1 mg by mouth daily. 03/12/19   [provider]  hydrALAZINE (APRESOLINE) 25 MG tablet Take 1 tablet (25 mg total) by mouth in the morning and at bedtime. 01/13/20   Jennye Boroughs, MD  iron polysaccharides (NIFEREX) 150 MG capsule Take 1 capsule (150 mg total) by mouth daily. Patient not taking: Reported on 04/18/2020 01/05/20 04/08/20  Enzo Bi, MD  Jerral Ralph  100 UNIT/ML Solostar Pen Inject 2 Units into the skin daily. 04/22/20   Nolberto Hanlon, MD  lidocaine-prilocaine (EMLA) cream Apply 1 application topically as directed. 02/20/20   [provider]  Melatonin 5 MG CAPS Take 5 mg by mouth at bedtime.  05/23/19   [provider]  mirtazapine (REMERON) 7.5 MG tablet Take 7.5 mg by mouth at bedtime.  02/25/20   [provider]  multivitamin (RENA-VIT) TABS tablet Take 1 tablet by mouth daily.    [provider]  Nutritional Supplements (FEEDING SUPPLEMENT, NEPRO CARB STEADY,)  LIQD Take 237 mLs by mouth 3 (three) times daily between meals. Patient not taking: Reported on 03/07/2020 07/08/19   Nicole Kindred A, DO  sertraline (ZOLOFT) 100 MG tablet Take 1.5 tablets (150 mg total) by mouth daily. 04/11/20   Nita Sells, MD  thiamine (VITAMIN B-1) 100 MG tablet Take 100 mg by mouth daily. Patient not taking: Reported on 03/07/2020    [provider]    Allergies Ferrous gluconate and Other  Family History  Problem Relation Age of Onset  . CAD Brother   . Dementia Mother   . Renal Disease Father     Social History Social History   Tobacco Use  . Smoking status: Current Every Day Smoker    Packs/day: 0.25    Years: 5.00    Pack years: 1.25    Types: Cigars  . Smokeless tobacco: Never Used  Vaping Use  . Vaping Use: Never used  Substance Use Topics  . Alcohol use: Yes    Alcohol/week: 16.0 standard drinks    Types: 14 Cans of beer, 2 Shots of liquor per week  . Drug use: No    Review of Systems Constitutional: No fever/chills Eyes: No visual changes. ENT: No sore throat. Cardiovascular: Denies chest pain. Respiratory: Positive for chronic shortness of breath. Gastrointestinal: No abdominal pain.  No nausea, no vomiting.  No diarrhea.   Genitourinary: Negative for dysuria. Musculoskeletal: Negative for back pain. Skin: Negative for rash. Neurological: Positive for chronic right sided weakness. ____________________________________________   PHYSICAL EXAM:  VITAL SIGNS: ED Triage Vitals  Enc Vitals Group     BP 04/26/20 1207 (!) 96/57     Pulse Rate 04/26/20 1207 (!) 117     Resp 04/26/20 1207 18     Temp 04/26/20 1207 97.6 F (36.4 C)     Temp Source 04/26/20 1207 Oral     SpO2 04/26/20 1207 97 %     Weight 04/26/20 1202 136 lb 0.4 oz (61.7 kg)     Height 04/26/20 1202 5\' 11"  (1.803 m)     Head Circumference --      Peak Flow --      Pain Score 04/26/20 1202 0   Constitutional: Alert and oriented.  Eyes:  Conjunctivae are normal.  ENT      Head: Normocephalic and atraumatic.      Nose: No congestion/rhinnorhea.      Mouth/Throat: Mucous membranes are moist.      Neck: No stridor. Hematological/Lymphatic/Immunilogical: No cervical lymphadenopathy. Cardiovascular: Normal rate, regular rhythm.  No murmurs, rubs, or gallops.  Respiratory: Normal respiratory effort without tachypnea nor retractions. Breath sounds are clear and equal bilaterally. No wheezes/rales/rhonchi. Gastrointestinal: Soft and non tender. No rebound. No guarding.  Genitourinary: Deferred Musculoskeletal: Normal range of motion in all extremities. Trace lower extremity edema.  Neurologic:  Normal speech and language. No gross focal neurologic deficits are appreciated.  Skin:  Skin is warm,  dry and intact. No rash noted. Psychiatric: Mood and affect are normal. Speech and behavior are normal. Patient exhibits appropriate insight and judgment.  ____________________________________________    LABS (pertinent positives/negatives)  CBC wbc 4.2, hgb 9.1, plt 171 BMP na 140, k 4.0, glu 117, cr 3.31  ____________________________________________   EKG  I, Nance Pear, attending physician, personally viewed and interpreted this EKG  EKG Time: 1218 Rate: 68 Rhythm: atrial fibrillation Axis: left axis deviation Intervals: qtc 497 QRS: narrow ST changes: no st elevation Impression: abnormal ekg  ____________________________________________    RADIOLOGY  CXR Cardiomegaly with diffuse interstitial prominence concerning for edema.    ____________________________________________   PROCEDURES  Procedures  ____________________________________________   INITIAL IMPRESSION / ASSESSMENT AND PLAN / ED COURSE  Pertinent labs & imaging results that were available during my care of the patient were reviewed by me and considered in my medical decision making (see chart for details).   Patient presented to the  emergency department today for somewhat unclear reasons.  He variably told staff that he was here because he is homeless, he would like therapy and that his blood sugar was low.  On work-up here his blood sugar was not low.  I did discussion with patient about the type of therapy he was hoping to receive.  He states he has had physical therapy in the past but it has not worked for him so he is not sure what type of therapy he would like.  Additionally wife was contacted and patient has not been kicked out of his home.  He did request discharge home.  Discussed with that the x-ray continues to show some fluid.  Patient however states that his breathing is roughly at his baseline.  Given that he would like to go home will plan on discharging home.  Did discuss with patient importance of close follow-up with his physician.  Also discussed with patient importance of taking his medication.  Will give patient heart failure clinic follow-up information.  ____________________________________________   FINAL CLINICAL IMPRESSION(S) / ED DIAGNOSES  Final diagnoses:  Multiple complaints     Note: This dictation was prepared with Dragon dictation. Any transcriptional errors that result from this process are unintentional     Nance Pear, MD 04/26/20 1605

## 2020-04-26 NOTE — ED Notes (Signed)
Attempted to draw blood.  Able to access vein but blood not pulling.  Phlebotomy called.

## 2020-04-26 NOTE — ED Triage Notes (Addendum)
Patient states his wife 'put him out' this morning, so patient is homeless.  States homelessness is the reason he presents to the ED.  Patient states he wants short term placement

## 2020-04-26 NOTE — Discharge Instructions (Signed)
Please follow up with your physician and the heart failure clinic. It is extremely important that you return to the emergency department for any worsening of breathing, chest pain, fevers, increased weakness or any other new or concerning symptoms.

## 2020-04-26 NOTE — ED Notes (Signed)
Called wife, states that she is at work until 6:30. Will attempt to get EMS transport home.

## 2020-04-27 ENCOUNTER — Other Ambulatory Visit: Payer: Self-pay

## 2020-04-27 ENCOUNTER — Telehealth (HOSPITAL_COMMUNITY): Payer: Self-pay

## 2020-04-27 ENCOUNTER — Other Ambulatory Visit: Payer: Medicare Other | Admitting: Adult Health Nurse Practitioner

## 2020-04-27 NOTE — Telephone Encounter (Signed)
Spoke with Erling wife and she states everything is ok.  Dallis is at home, she will be going to work.  He has appt with his endocrinologist today and she has made notes to let him know what has been going on with his sugars lately.  She has called EMS several times in past couple of weeks.   He has an aid that will be taking him today.  Advised her I will be making a visit next week to do his medications.  Asked if she picked up his prescription for antibiotic and she states yes, he has taken it.  Will continue to visit for heart failure.   [place] [phone]

## 2020-05-02 ENCOUNTER — Encounter (HOSPITAL_COMMUNITY): Payer: Self-pay

## 2020-05-02 ENCOUNTER — Other Ambulatory Visit (HOSPITAL_COMMUNITY): Payer: Self-pay

## 2020-05-02 NOTE — Progress Notes (Signed)
Today had a home visit with [name].  His wife is there and aid.  Discussed a lot with them about diet and counting carbs.  They thought is it did not have sugar it was fine. They advised his sugar has been high since yesterday, advised them to contact his doctor.  He is alert, wanting to eat anything.  He is a Agricultural consultant.  Suggested different items to snack on and advised to watch fluids.  She states will contact his doctor, she had a lot of questions about giving his lantus.  He was laying in the bed, not wanting to be bothered.  Advised him about snacks also.  He has most of his medications, his wife advised called in his refills.  He does not have any iron or thiamine.  His wife was going to ask his doctor ut he did not make the appt.  He denies any problems except he is tired.  Filled his med boxes.  She states dialysis said for him to take his medications after his dialysis because they wash everything out.  She will start giving his meds at around 10:30 am and at bedtime around 9:00 pm.  He has a monitor that alerts them of his blood sugars.  Will continue to visit for heart failure.   [place] [phone]

## 2020-05-06 NOTE — Progress Notes (Signed)
[name], male    DOB: [birth year], 60 y.o.   MRN: [id number]  HPI  [name] is a 59 y/o male with a history of DM, hyperlipidemia, HTN, ESRD on dialysis, stroke, GERD, current tobacco use and chronic heart failure.   Echo report from 12/07/19 reviewed and showed an EF of 45-50% along with severely elevated PA pressure, moderate LAE, moderate/ severe Reginald & moderate TR.   Was in the ED 04/26/20 due to multiple, inconsistent complaints where he was evaluated and released. Admitted 04/20/20 due to being found unresponsive with glucose in the 30's. Glucagon given. Placed on 10L oxygen via nonbreather mask. Nephrology consulted due to need for dialysis. Insulin decreased. Oxygen weaned down to baseline 5L. Antibiotics given for aspiration pneumonia. 1 unit PRBC's given for hemoglobin of 6.4. Discharged after 2 days.   He presents today for a follow-up visit with a chief complaint of minimal fatigue upon moderate exertion. Symptom has been present for several years with varying levels of severity. He doesn't describe any other symptoms and specifically denies any difficulty sleeping, dizziness, abdominal distention, palpitations, pedal edema, chest pain, shortness of breath, cough or weight gain.   Aide that is with him says that he spends ~ 4 hours daily 4 days/ week with patient. Patient tends to eat many snacks throughout the day and likes to chew bubble gum that is not sugar free. Glucose levels have been fluctuating. Wife that is on speakerphone says that she's not sure that patient is taking carvedilol.   Past Medical History:  Diagnosis Date  . CHF (congestive heart failure) ([place])   . Chronic kidney disease   . Diabetes mellitus without complication ([place])   . ETOH abuse   . GERD (gastroesophageal reflux disease)   . Hyperlipidemia   . Hypertension   . Stroke [facility])    Past Surgical History:  Procedure Laterality Date  . AMPUTATION TOE Left 12/11/2019   Procedure: AMPUTATION  LEFT 2nd TOE;  Surgeon: Sharlotte Alamo, DPM;  Location: [facility] ORS;  Service: Podiatry;  Laterality: Left;  . AV FISTULA PLACEMENT Right 11/25/2019   Procedure: INSERTION OF ARTERIOVENOUS (AV) GORE-TEX GRAFT ARM;  Surgeon: Katha Cabal, MD;  Location: [facility] ORS;  Service: Vascular;  Laterality: Right;  . COLONOSCOPY WITH PROPOFOL N/A 03/06/2019   Procedure: COLONOSCOPY WITH PROPOFOL;  Surgeon: Lucilla Lame, MD;  Location: [facility] ENDOSCOPY;  Service: Endoscopy;  Laterality: N/A;  . DIALYSIS/PERMA CATHETER INSERTION N/A 04/10/2019   Procedure: DIALYSIS/PERMA CATHETER INSERTION;  Surgeon: Serafina Mitchell, MD;  Location: [place] CV LAB;  Service: Cardiovascular;  Laterality: N/A;  . DIALYSIS/PERMA CATHETER REMOVAL N/A 03/07/2020   Procedure: DIALYSIS/PERMA CATHETER REMOVAL;  Surgeon: Algernon Huxley, MD;  Location: [place] [facility] LAB;  Service: Cardiovascular;  Laterality: N/A;  . ESOPHAGOGASTRODUODENOSCOPY (EGD) WITH PROPOFOL N/A 07/01/2016   Procedure: ESOPHAGOGASTRODUODENOSCOPY (EGD) WITH PROPOFOL;  Surgeon: San Jetty, MD;  Location: [facility] ENDOSCOPY;  Service: General;  Laterality: N/A;  . ESOPHAGOGASTRODUODENOSCOPY (EGD) WITH PROPOFOL N/A 12/25/2019   Procedure: ESOPHAGOGASTRODUODENOSCOPY (EGD) WITH PROPOFOL;  Surgeon: Lin Landsman, MD;  Location: [facility] ENDOSCOPY;  Service: Gastroenterology;  Laterality: N/A;  . LOWER EXTREMITY ANGIOGRAPHY Left 12/10/2019   Procedure: Lower Extremity Angiography;  Surgeon: Algernon Huxley, MD;  Location: [place] [facility] LAB;  Service: Cardiovascular;  Laterality: Left;   Family History  Problem Relation Age of Onset  . CAD Brother   . Dementia Mother   . Renal Disease Father    Social  History   Tobacco Use  . Smoking status: Current Every Day Smoker    Packs/day: 0.25    Years: 5.00    Pack years: 1.25    Types: Cigars  . Smokeless tobacco: Never Used  Substance Use Topics  . Alcohol use: Yes    Alcohol/week: 16.0 standard drinks    Types: 14  Cans of beer, 2 Shots of liquor per week   Allergies  Allergen Reactions  . Ferrous Gluconate Nausea And Vomiting  . Other    Prior to Admission medications   Medication Sig Start Date End Date Taking? Authorizing Provider  aspirin EC 81 MG EC tablet Take 1 tablet (81 mg total) by mouth daily. 06/10/19  Yes Jennye Boroughs, MD  ENTRESTO 24-26 MG Take 1 tablet by mouth 2 (two) times daily. 07/29/19  Yes [provider]  epoetin alfa (EPOGEN) 10000 UNIT/ML injection Inject 0.4 mLs (4,000 Units total) into the vein Every Tuesday,Thursday,and Saturday with dialysis. 01/05/20  Yes Enzo Bi, MD  folic acid (FOLVITE) 1 MG tablet Take 1 mg by mouth daily. 03/12/19  Yes [provider]  hydrALAZINE (APRESOLINE) 25 MG tablet Take 1 tablet (25 mg total) by mouth in the morning and at bedtime. 01/13/20  Yes Jennye Boroughs, MD  iron polysaccharides (NIFEREX) 150 MG capsule Take 1 capsule (150 mg total) by mouth daily. 01/05/20 04/08/20 Yes Enzo Bi, MD  LANTUS SOLOSTAR 100 UNIT/ML Solostar Pen Inject 2 Units into the skin daily. 04/22/20  Yes Nolberto Hanlon, MD  lidocaine-prilocaine (EMLA) cream Apply 1 application topically as directed. 02/20/20  Yes [provider]  Melatonin 5 MG CAPS Take 5 mg by mouth at bedtime.  05/23/19  Yes [provider]  mirtazapine (REMERON) 7.5 MG tablet Take 7.5 mg by mouth at bedtime.  02/25/20  Yes [provider]  multivitamin (RENA-VIT) TABS tablet Take 1 tablet by mouth daily.   Yes [provider]  Nutritional Supplements (FEEDING SUPPLEMENT, NEPRO CARB STEADY,) LIQD Take 237 mLs by mouth 3 (three) times daily between meals. 07/08/19  Yes Nicole Kindred A, DO  sertraline (ZOLOFT) 100 MG tablet Take 1.5 tablets (150 mg total) by mouth daily. 04/11/20  Yes Nita Sells, MD  thiamine (VITAMIN B-1) 100 MG tablet Take 100 mg by mouth daily.   Yes [provider]  carvedilol (COREG) 6.25 MG tablet Take 6.25 mg by  mouth 2 (two) times daily with a meal.  Patient not taking: Reported on 05/09/2020 10/28/19   [provider]    Review of Systems  Constitutional: Positive for fatigue. Negative for appetite change.  HENT: Negative for congestion, postnasal drip and sore throat.   Eyes: Negative.   Respiratory: Negative for cough, shortness of breath and wheezing.   Cardiovascular: Negative for chest pain, palpitations and leg swelling.  Gastrointestinal: Negative for abdominal distention and abdominal pain.  Endocrine: Negative.   Genitourinary: Negative.   Musculoskeletal: Negative for back pain and neck pain.  Skin: Negative.   Allergic/Immunologic: Negative.   Neurological: Negative for dizziness and light-headedness.  Hematological: Negative for adenopathy. Does not bruise/bleed easily.  Psychiatric/Behavioral: Negative for dysphoric mood and sleep disturbance. The patient is not nervous/anxious.    Vitals:   05/09/20 1233  BP: (!) 164/97  Pulse: 72  Resp: 18  SpO2: 100%  Weight: 136 lb (61.7 kg)  Height: 5\' 11"  (1.803 m)   Wt Readings from Last 3 Encounters:  05/09/20 136 lb (61.7 kg)  04/26/20 136 lb 0.4 oz (61.7  kg)  04/20/20 136 lb 0.4 oz (61.7 kg)   Lab Results  Component Value Date   CREATININE 3.31 (H) 04/26/2020   CREATININE 2.59 (H) 04/21/2020   CREATININE 3.59 (H) 04/20/2020    Physical Exam Vitals and nursing note reviewed. Exam conducted with a chaperone present (aide is with patient).  Constitutional:      Appearance: Normal appearance.  HENT:     Head: Normocephalic and atraumatic.  Cardiovascular:     Rate and Rhythm: Normal rate and regular rhythm.  Pulmonary:     Effort: Pulmonary effort is normal. No respiratory distress.     Breath sounds: No wheezing or rales.  Abdominal:     General: There is no distension.     Palpations: Abdomen is soft.     Tenderness: There is no abdominal tenderness.  Musculoskeletal:        General: No tenderness.      Cervical back: Normal range of motion and neck supple.     Right lower leg: Edema (trace pitting) present.     Left lower leg: Edema (trace pitting) present.  Skin:    General: Skin is warm and dry.  Neurological:     General: No focal deficit present.     Mental Status: He is alert. Mental status is at baseline.  Psychiatric:        Mood and Affect: Mood normal.        Behavior: Behavior normal.    Assessment & Plan:  1: Chronic heart failure with minimally reduced ejection fraction with structural changes (LAE)- - NYHA class II - euvolemic today - being weighed at dialysis and patient refused to be weighed on scale here today - not adding salt but does like to eat snack foods like fritos; reviewed the importance of limiting those high salty foods - aide that is with him says that patient is not very active and was wondering about PT; encouraged him to speak with PCP regarding referral - wife to check when she gets home from work to make sure that patient has his carvedilol  - palliative care consult done 04/01/20 - BNP 04/20/20 was >4500.0  2: HTN- - BP mildly elevated today; due for dialysis tomorrow - saw PCP Vidal Schwalbe) 04/29/20 - BMP 04/26/20 reviewed and showed sodium 140, potassium 4.0, creatinine 3.31 and GFR 20  3: DM- - saw endocrinology ([facility]) 04/27/20 - A1c 04/09/20 was 6.5% - nonfasting glucose here today was 317 - info on healthy carb foods as well as carb foods to avoid printed and given to aide; patient has been chewing bubble gum that is not sugar free; explained that if he was going to chew gum, it needed to be sugar free  4: ESRD on dialysis-  - dialysis on T,T,S   Patient did not bring his medications nor a list. Each medication was verbally reviewed with the patient and he was encouraged to bring the bottles to every visit to confirm accuracy of list.  Return in 4 months or sooner for any questions/problems before then.

## 2020-05-09 ENCOUNTER — Ambulatory Visit: Payer: Medicare Other | Attending: Family | Admitting: Family

## 2020-05-09 ENCOUNTER — Encounter: Payer: Self-pay | Admitting: Family

## 2020-05-09 ENCOUNTER — Other Ambulatory Visit: Payer: Self-pay

## 2020-05-09 VITALS — BP 164/97 | HR 72 | Resp 18 | Ht 71.0 in | Wt 136.0 lb

## 2020-05-09 DIAGNOSIS — K219 Gastro-esophageal reflux disease without esophagitis: Secondary | ICD-10-CM | POA: Diagnosis not present

## 2020-05-09 DIAGNOSIS — F17290 Nicotine dependence, other tobacco product, uncomplicated: Secondary | ICD-10-CM | POA: Insufficient documentation

## 2020-05-09 DIAGNOSIS — I10 Essential (primary) hypertension: Secondary | ICD-10-CM

## 2020-05-09 DIAGNOSIS — Z8673 Personal history of transient ischemic attack (TIA), and cerebral infarction without residual deficits: Secondary | ICD-10-CM | POA: Insufficient documentation

## 2020-05-09 DIAGNOSIS — E118 Type 2 diabetes mellitus with unspecified complications: Secondary | ICD-10-CM | POA: Diagnosis present

## 2020-05-09 DIAGNOSIS — Z8249 Family history of ischemic heart disease and other diseases of the circulatory system: Secondary | ICD-10-CM | POA: Insufficient documentation

## 2020-05-09 DIAGNOSIS — Z79899 Other long term (current) drug therapy: Secondary | ICD-10-CM | POA: Diagnosis not present

## 2020-05-09 DIAGNOSIS — N186 End stage renal disease: Secondary | ICD-10-CM | POA: Insufficient documentation

## 2020-05-09 DIAGNOSIS — Z794 Long term (current) use of insulin: Secondary | ICD-10-CM | POA: Insufficient documentation

## 2020-05-09 DIAGNOSIS — E785 Hyperlipidemia, unspecified: Secondary | ICD-10-CM | POA: Insufficient documentation

## 2020-05-09 DIAGNOSIS — Z992 Dependence on renal dialysis: Secondary | ICD-10-CM | POA: Diagnosis not present

## 2020-05-09 DIAGNOSIS — I132 Hypertensive heart and chronic kidney disease with heart failure and with stage 5 chronic kidney disease, or end stage renal disease: Secondary | ICD-10-CM | POA: Diagnosis not present

## 2020-05-09 DIAGNOSIS — I5022 Chronic systolic (congestive) heart failure: Secondary | ICD-10-CM | POA: Diagnosis not present

## 2020-05-09 DIAGNOSIS — E1122 Type 2 diabetes mellitus with diabetic chronic kidney disease: Secondary | ICD-10-CM | POA: Insufficient documentation

## 2020-05-09 DIAGNOSIS — Z7982 Long term (current) use of aspirin: Secondary | ICD-10-CM | POA: Diagnosis not present

## 2020-05-09 LAB — GLUCOSE, CAPILLARY: Glucose-Capillary: 317 mg/dL — ABNORMAL HIGH (ref 70–99)

## 2020-05-09 NOTE — Patient Instructions (Addendum)
Continue weighing daily and call for an overnight weight gain of > 2 pounds or a weekly weight gain of >5 pounds.   Check to make sure you have carvedilol bottle at home. If not, call me back and let me know. If you have it, make sure he takes it twice a day.

## 2020-05-13 ENCOUNTER — Other Ambulatory Visit: Payer: Medicare Other | Admitting: Adult Health Nurse Practitioner

## 2020-05-19 ENCOUNTER — Ambulatory Visit: Payer: Medicare Other | Admitting: Podiatry

## 2020-05-19 ENCOUNTER — Other Ambulatory Visit (HOSPITAL_COMMUNITY): Payer: Self-pay

## 2020-05-19 NOTE — Progress Notes (Signed)
Today had a home visit with [name] and wife.  He states feeling ok today, he is tired from dialysis.  His care worker has been looking at packages and controlling serving size of his snacks and foods.  His sugars are much better, he has not had no lows, sugars has been between 120 and 200's. They have been watching him closely of what he has been eating.  He is yelling out he wants chicken and dumplings, advised he could have some, just not a whole serving. He has all his medications and refilled his boxes.  He has been taking his medications after dialysis and they advise can tell a big difference in him, he feels better.  He has been riding [employer] for transportation and he says they will not help him stand or get off no more, that he will have to find a new ride.  Suggested to his wife to call them, if he needs to go by wheel chair, then send him by that.  She states she will call them.  He denies any pain today, headaches, dizziness or shortness of breath.  He got out of bed and came out to living room to talk to me.  He states he would like something to raise his toilet up because tough for him to stand.  His wife says she just got one from a friend, advised her if does not work to let me know.  He has everything for daily living.  His wife works a lot and not home a lot.  She has help in the home with him.  Will continue to visit for heart failure.   [name] [phone]

## 2020-05-27 ENCOUNTER — Other Ambulatory Visit: Payer: Self-pay

## 2020-05-27 ENCOUNTER — Other Ambulatory Visit: Payer: Medicare Other | Admitting: Adult Health Nurse Practitioner

## 2020-05-27 DIAGNOSIS — E118 Type 2 diabetes mellitus with unspecified complications: Secondary | ICD-10-CM

## 2020-05-27 DIAGNOSIS — N186 End stage renal disease: Secondary | ICD-10-CM

## 2020-05-27 DIAGNOSIS — Z515 Encounter for palliative care: Secondary | ICD-10-CM

## 2020-05-27 NOTE — Progress Notes (Signed)
[facility] Consult Note Telephone: [phone]  Fax: [phone]  PATIENT NAME: [name] DOB: 01-Jul-1960 MRN: [id number]  PRIMARY CARE PROVIDER:   [name], MD  REFERRING PROVIDER:  [name], MD [address] [facility] [place],  [place] [address]  RESPONSIBLE PARTY:   [name], wife [name]  Chief complaint:  Follow up palliative consult/hypoglycemia   RECOMMENDATIONS and PLAN: 1.Advanced care planning. full code  2.  ESRD.  Dialysis is going well with no reported problems with AV fistula.  Continue dialysis and follow up and recommendations by nephrology  3.  DMT2.  Patient doing much better with improved diet and changes in insulin regimen. Continue follow up and recommendations by encocrinology  Palliative will continue to monitor for symptom management/decline and make recommendations as needed. Next appointment is in 8 weeks. Encouraged to call with any questions or concerns.  HISTORY OF PRESENT ILLNESS:  [name] is a 60 y.o. year old male with multiple medical problems including ESRD on HD T,TH,SAT;DMT 2, CVA with right-sided weakness, HTN, HLD, chronic bilateral ICA occlusion. Palliative Care was asked to help address goals of care. Reviewed patient's chart, labs, and obtained most of HPI from wife.  Patient had severe episodes of hypoglycemia in reaction to trying to bring down his elevated blood sugars.  He was hospitalized 44/81-85/63/14 with metabolic encephalopathy related to hypoglycemia and evaluated in ER on 04/20/20 and 04/26/20 for hypoglycemia.  Patient has been seen by endocrinology and his insulin adjusted.  Wife states that he is doing better and blood sugars have been in 120s to 200s.  He does monitor his blood sugar 4-5x per day.  Wife and in home aide are trying to monitor his intake and limit his intake of junk food.   They are providing more sugar options.  Dialysis is going well.  Wife is in process of getting him a wheelchair to better help with public transportation through [place].  In the meantime he is borrowing one from [facility] dialysis.  He does have an aide that comes for 4 hours a day M, T,W, TH, which has been a big help for his wife.  Rest of 10 point ROS asked and negative.    CODE STATUS: full code  PPS: 40% HOSPICE ELIGIBILITY/DIAGNOSIS: TBD  PHYSICAL EXAM:  BP 118/62  HR 69  O2 90% on 5L General: NAD, frail appearing, thin Eyes:  Sclera anicteric and noninjected with no discharge noted Cardiovascular: regular rate and rhythm; AV fistula to right upper arm with thrill felt and bruit heard Pulmonary: lung sounds clear; normal respiratory effort Abdomen: soft, nontender, + bowel sounds Extremities: no edema, no joint deformities Skin: no rashes on exposed skin Neurological: Weakness but otherwise nonfocal; does have occasional forgetfulness and confusion  PAST MEDICAL HISTORY:  Past Medical History:  Diagnosis Date  . CHF (congestive heart failure) ([place])   . Chronic kidney disease   . Diabetes mellitus without complication ([place])   . ETOH abuse   . GERD (gastroesophageal reflux disease)   . Hyperlipidemia   . Hypertension   . Stroke [facility])     SOCIAL HX:  Social History   Tobacco Use  . Smoking status: Current Every Day Smoker    Packs/day: 0.25    Years: 5.00    Pack years: 1.25    Types: Cigars  . Smokeless tobacco: Never Used  Substance Use Topics  . Alcohol use: Yes    Alcohol/week:  16.0 standard drinks    Types: 14 Cans of beer, 2 Shots of liquor per week    ALLERGIES:  Allergies  Allergen Reactions  . Ferrous Gluconate Nausea And Vomiting  . Other      PERTINENT MEDICATIONS:  Outpatient Encounter Medications as of 05/27/2020  Medication Sig  . aspirin EC 81 MG EC tablet Take 1 tablet (81 mg total) by mouth daily.  . carvedilol (COREG) 6.25 MG tablet Take 6.25 mg  by mouth 2 (two) times daily with a meal.  . ENTRESTO 24-26 MG Take 1 tablet by mouth 2 (two) times daily.  [name] epoetin alfa (EPOGEN) 10000 UNIT/ML injection Inject 0.4 mLs (4,000 Units total) into the vein Every Tuesday,Thursday,and Saturday with dialysis.  . folic acid (FOLVITE) 1 MG tablet Take 1 mg by mouth daily. (Patient not taking: Reported on 05/19/2020)  . hydrALAZINE (APRESOLINE) 25 MG tablet Take 1 tablet (25 mg total) by mouth in the morning and at bedtime.  . iron polysaccharides (NIFEREX) 150 MG capsule Take 1 capsule (150 mg total) by mouth daily.  [name] LANTUS SOLOSTAR 100 UNIT/ML Solostar Pen Inject 2 Units into the skin daily.  [name] lidocaine-prilocaine (EMLA) cream Apply 1 application topically as directed.  . Melatonin 5 MG CAPS Take 5 mg by mouth at bedtime.   . mirtazapine (REMERON) 7.5 MG tablet Take 7.5 mg by mouth at bedtime.   . multivitamin (RENA-VIT) TABS tablet Take 1 tablet by mouth daily.  . Nutritional Supplements (FEEDING SUPPLEMENT, NEPRO CARB STEADY,) LIQD Take 237 mLs by mouth 3 (three) times daily between meals.  . sertraline (ZOLOFT) 100 MG tablet Take 1.5 tablets (150 mg total) by mouth daily.  [name] thiamine (VITAMIN B-1) 100 MG tablet Take 100 mg by mouth daily. (Patient not taking: Reported on 05/19/2020)   No facility-administered encounter medications on file as of 05/27/2020.     [name] Jenetta Downer, NP

## 2020-05-30 ENCOUNTER — Ambulatory Visit: Payer: Medicare Other | Admitting: Podiatry

## 2020-06-02 ENCOUNTER — Other Ambulatory Visit (HOSPITAL_COMMUNITY): Payer: Self-pay

## 2020-06-02 NOTE — Progress Notes (Signed)
Today had a home visit with [name].  He states he had a rough time at dialysis.  He is not wanting to be bothered, he is laying in bed.  Wife states he had a hard time because he missed dialysis on Tuesday due to transportation was not running due to weather.  His sugars have been good.  They are still watching his serving sizes.  He has all his medications, refilled his boxes.  Called in refills for iron, entresto, mirtazapine, sertraline.  Advised wife they will be ready tomorrow after 11am.  She states she will get them.  Will continue to visit for heart failure, diet and medication compliance.   [place] [phone]

## 2020-06-03 ENCOUNTER — Telehealth: Payer: Self-pay | Admitting: Adult Health Nurse Practitioner

## 2020-06-03 NOTE — Telephone Encounter (Signed)
Returned wife's VM.  She is needing a letter for her husband for his lawyer stating reason for provider visits and reason why we are following him.  Will type up letter and have office place on company letter head and then mail to patient. Robynne Roat K. Olena Heckle NP

## 2020-06-13 ENCOUNTER — Telehealth (INDEPENDENT_AMBULATORY_CARE_PROVIDER_SITE_OTHER): Payer: Self-pay

## 2020-06-13 NOTE — Telephone Encounter (Signed)
I received a fax regarding this patient that had to be cleared up as far as the patient's name and DOB as well as what was needed. I called and spoke with Amber regarding the patient and we agreed on 06/14/20 with a 11:00 am arrival time to the [facility] and covid testing today before 3:00 pm at the [facility]. This information was given to [employer]. I received a call from [place] at [facility] stating the patient's wife cannot get him to do his covid test. Patient has been rescheduled to 06/15/20 with a 10:45 am arrival time to the [facility]. Covid testing on 06/14/20 between 8-1 pm at the [facility]. Pre-procedure instructions were discussed with Amber at [facility] and will be discussed with the patient and spouse.

## 2020-06-14 ENCOUNTER — Other Ambulatory Visit: Payer: Self-pay

## 2020-06-14 ENCOUNTER — Other Ambulatory Visit
Admission: RE | Admit: 2020-06-14 | Discharge: 2020-06-14 | Disposition: A | Discharge status: Home / Self Care | Payer: Medicare Other | Source: Ambulatory Visit | Attending: Vascular Surgery | Admitting: Vascular Surgery

## 2020-06-14 ENCOUNTER — Other Ambulatory Visit (INDEPENDENT_AMBULATORY_CARE_PROVIDER_SITE_OTHER): Payer: Self-pay | Admitting: Nurse Practitioner

## 2020-06-14 DIAGNOSIS — E11649 Type 2 diabetes mellitus with hypoglycemia without coma: Secondary | ICD-10-CM | POA: Diagnosis not present

## 2020-06-14 DIAGNOSIS — Z01812 Encounter for preprocedural laboratory examination: Secondary | ICD-10-CM | POA: Insufficient documentation

## 2020-06-14 DIAGNOSIS — Z20822 Contact with and (suspected) exposure to covid-19: Secondary | ICD-10-CM | POA: Insufficient documentation

## 2020-06-14 DIAGNOSIS — E162 Hypoglycemia, unspecified: Secondary | ICD-10-CM | POA: Diagnosis not present

## 2020-06-14 LAB — SARS CORONAVIRUS 2 (TAT 6-24 HRS): SARS Coronavirus 2: NEGATIVE

## 2020-06-15 ENCOUNTER — Other Ambulatory Visit: Payer: Self-pay

## 2020-06-15 ENCOUNTER — Ambulatory Visit (HOSPITAL_BASED_OUTPATIENT_CLINIC_OR_DEPARTMENT_OTHER)
Admission: RE | Admit: 2020-06-15 | Discharge: 2020-06-15 | Disposition: A | Discharge status: Home / Self Care | Payer: Medicare Other | Source: Home / Self Care | Attending: Vascular Surgery | Admitting: Vascular Surgery

## 2020-06-15 ENCOUNTER — Encounter
Admission: RE | Disposition: A | Discharge status: Home / Self Care | Payer: Self-pay | Source: Home / Self Care | Attending: Vascular Surgery

## 2020-06-15 ENCOUNTER — Encounter: Payer: Self-pay | Admitting: Vascular Surgery

## 2020-06-15 DIAGNOSIS — Y841 Kidney dialysis as the cause of abnormal reaction of the patient, or of later complication, without mention of misadventure at the time of the procedure: Secondary | ICD-10-CM | POA: Insufficient documentation

## 2020-06-15 DIAGNOSIS — N186 End stage renal disease: Secondary | ICD-10-CM

## 2020-06-15 DIAGNOSIS — F17290 Nicotine dependence, other tobacco product, uncomplicated: Secondary | ICD-10-CM | POA: Insufficient documentation

## 2020-06-15 DIAGNOSIS — Z992 Dependence on renal dialysis: Secondary | ICD-10-CM

## 2020-06-15 DIAGNOSIS — T82868A Thrombosis of vascular prosthetic devices, implants and grafts, initial encounter: Secondary | ICD-10-CM | POA: Insufficient documentation

## 2020-06-15 DIAGNOSIS — E1122 Type 2 diabetes mellitus with diabetic chronic kidney disease: Secondary | ICD-10-CM | POA: Insufficient documentation

## 2020-06-15 DIAGNOSIS — I132 Hypertensive heart and chronic kidney disease with heart failure and with stage 5 chronic kidney disease, or end stage renal disease: Secondary | ICD-10-CM | POA: Insufficient documentation

## 2020-06-15 DIAGNOSIS — I509 Heart failure, unspecified: Secondary | ICD-10-CM | POA: Insufficient documentation

## 2020-06-15 HISTORY — PX: PERIPHERAL VASCULAR THROMBECTOMY: CATH118306

## 2020-06-15 LAB — GLUCOSE, CAPILLARY: Glucose-Capillary: 101 mg/dL — ABNORMAL HIGH (ref 70–99)

## 2020-06-15 LAB — POTASSIUM (ARMC VASCULAR LAB ONLY): Potassium (ARMC vascular lab): 4.8 (ref 3.5–5.1)

## 2020-06-15 SURGERY — PERIPHERAL VASCULAR THROMBECTOMY
Anesthesia: Moderate Sedation

## 2020-06-15 MED ORDER — DIPHENHYDRAMINE HCL 50 MG/ML IJ SOLN: 50.0000 mg | Freq: Once | INTRAMUSCULAR | Status: DC | PRN

## 2020-06-15 MED ORDER — MIDAZOLAM HCL 5 MG/5ML IJ SOLN
INTRAMUSCULAR | Status: AC
  Filled 2020-06-15: qty 5

## 2020-06-15 MED ORDER — MIDAZOLAM HCL 2 MG/ML PO SYRP: 8.0000 mg | ORAL_SOLUTION | Freq: Once | ORAL | Status: DC | PRN

## 2020-06-15 MED ORDER — FAMOTIDINE 20 MG PO TABS: 40.0000 mg | ORAL_TABLET | Freq: Once | ORAL | Status: DC | PRN

## 2020-06-15 MED ORDER — CEFAZOLIN SODIUM-DEXTROSE 1-4 GM/50ML-% IV SOLN
1.0000 g | Freq: Once | INTRAVENOUS | Status: AC
  Administered 2020-06-15: 1 g via INTRAVENOUS

## 2020-06-15 MED ORDER — SODIUM CHLORIDE 0.9 % IV SOLN: INTRAVENOUS | Status: DC

## 2020-06-15 MED ORDER — HYDROMORPHONE HCL 1 MG/ML IJ SOLN
1.0000 mg | Freq: Once | INTRAMUSCULAR | Status: DC | PRN
Start: 2020-06-15 — End: 2020-06-15

## 2020-06-15 MED ORDER — IODIXANOL 320 MG/ML IV SOLN
INTRAVENOUS | Status: DC | PRN
  Administered 2020-06-15: 35 mL

## 2020-06-15 MED ORDER — MIDAZOLAM HCL 2 MG/2ML IJ SOLN
INTRAMUSCULAR | Status: DC | PRN
  Administered 2020-06-15: 1 mg via INTRAVENOUS

## 2020-06-15 MED ORDER — ALTEPLASE 2 MG IJ SOLR
INTRAMUSCULAR | Status: AC
  Filled 2020-06-15: qty 4

## 2020-06-15 MED ORDER — FENTANYL CITRATE (PF) 100 MCG/2ML IJ SOLN
INTRAMUSCULAR | Status: AC
  Filled 2020-06-15: qty 2

## 2020-06-15 MED ORDER — HEPARIN SODIUM (PORCINE) 1000 UNIT/ML IJ SOLN
INTRAMUSCULAR | Status: DC | PRN
  Administered 2020-06-15: 3000 [IU] via INTRAVENOUS

## 2020-06-15 MED ORDER — FENTANYL CITRATE (PF) 100 MCG/2ML IJ SOLN
INTRAMUSCULAR | Status: DC | PRN
  Administered 2020-06-15: 50 ug via INTRAVENOUS

## 2020-06-15 MED ORDER — METHYLPREDNISOLONE SODIUM SUCC 125 MG IJ SOLR: 125.0000 mg | Freq: Once | INTRAMUSCULAR | Status: DC | PRN

## 2020-06-15 MED ORDER — ONDANSETRON HCL 4 MG/2ML IJ SOLN: 4.0000 mg | Freq: Four times a day (QID) | INTRAMUSCULAR | Status: DC | PRN

## 2020-06-15 SURGICAL SUPPLY — 16 items
BALLN LUTONIX AV 8X60X75 (BALLOONS) ×2
BALLOON LUTONIX AV 8X60X75 (BALLOONS) ×1 IMPLANT
CANISTER PENUMBRA ENGINE (MISCELLANEOUS) ×2 IMPLANT
CANNULA 5F STIFF (CANNULA) ×2 IMPLANT
CATH EMBOLECTOMY 5FR (BALLOONS) ×2 IMPLANT
CATH INDIGO 7D KIT (CATHETERS) ×2 IMPLANT
DRAPE BRACHIAL (DRAPES) ×2 IMPLANT
GUIDEWIRE VERSACORE 175CM (WIRE) ×4 IMPLANT
KIT ENCORE 26 ADVANTAGE (KITS) ×2 IMPLANT
PACK ANGIOGRAPHY (CUSTOM PROCEDURE TRAY) ×2 IMPLANT
SHEATH BRITE TIP 6FRX5.5 (SHEATH) ×2 IMPLANT
SHEATH BRITE TIP 7FRX5.5 (SHEATH) ×4 IMPLANT
SUT MNCRL 4-0 (SUTURE) ×2
SUT MNCRL 4-0 27XMFL (SUTURE) ×1
SUTURE MNCRL 4-0 27XMF (SUTURE) ×1 IMPLANT
TOWEL OR 17X26 4PK STRL BLUE (TOWEL DISPOSABLE) ×2 IMPLANT

## 2020-06-15 NOTE — H&P (Signed)
[facility] Admission History & Physical  MRN : [id number]  [name] is a 60 y.o. (07-23-1960) male who presents with chief complaint of No chief complaint on file. [name]  History of Present Illness: I am asked to evaluate the patient by the dialysis center. The patient was sent here because they were unable to cannulate the access this morning. Furthermore the Center states there is no thrill or bruit. The patient states this is the first dialysis run to be missed. This problem is acute in onset and has been present for approximately 2 days. The patient is unaware of any other change.  Patient denies pain or tenderness overlying the access.  There is no pain with dialysis.  The patient denies hand pain or finger pain consistent with steal syndrome.   There have been past interventions or declots of this access.  The patient is not chronically hypotensive on dialysis.  Current Facility-Administered Medications  Medication Dose Route Frequency Provider Last Rate Last Admin  . 0.9 %  sodium chloride infusion   Intravenous Continuous Kris Hartmann, NP      . ceFAZolin (ANCEF) IVPB 1 g/50 mL premix  1 g Intravenous Once Kris Hartmann, NP      . diphenhydrAMINE (BENADRYL) injection 50 mg  50 mg Intravenous Once PRN Kris Hartmann, NP      . famotidine (PEPCID) tablet 40 mg  40 mg Oral Once PRN Kris Hartmann, NP      . methylPREDNISolone sodium succinate (SOLU-MEDROL) 125 mg/2 mL injection 125 mg  125 mg Intravenous Once PRN Eulogio Ditch E, NP      . midazolam (VERSED) 2 MG/ML syrup 8 mg  8 mg Oral Once PRN Kris Hartmann, NP        Past Medical History:  Diagnosis Date  . CHF (congestive heart failure) ([place])   . Chronic kidney disease   . Diabetes mellitus without complication ([place])   . ETOH abuse   . GERD (gastroesophageal reflux disease)   . Hyperlipidemia   . Hypertension   . Stroke [facility])     Past Surgical History:  Procedure Laterality Date  .  AMPUTATION TOE Left 12/11/2019   Procedure: AMPUTATION LEFT 2nd TOE;  Surgeon: Sharlotte Alamo, DPM;  Location: [facility] ORS;  Service: Podiatry;  Laterality: Left;  . AV FISTULA PLACEMENT Right 11/25/2019   Procedure: INSERTION OF ARTERIOVENOUS (AV) GORE-TEX GRAFT ARM;  Surgeon: Katha Cabal, MD;  Location: [facility] ORS;  Service: Vascular;  Laterality: Right;  . COLONOSCOPY WITH PROPOFOL N/A 03/06/2019   Procedure: COLONOSCOPY WITH PROPOFOL;  Surgeon: Lucilla Lame, MD;  Location: [facility] ENDOSCOPY;  Service: Endoscopy;  Laterality: N/A;  . DIALYSIS/PERMA CATHETER INSERTION N/A 04/10/2019   Procedure: DIALYSIS/PERMA CATHETER INSERTION;  Surgeon: Serafina Mitchell, MD;  Location: [place] CV LAB;  Service: Cardiovascular;  Laterality: N/A;  . DIALYSIS/PERMA CATHETER REMOVAL N/A 03/07/2020   Procedure: DIALYSIS/PERMA CATHETER REMOVAL;  Surgeon: Algernon Huxley, MD;  Location: [place] CV LAB;  Service: Cardiovascular;  Laterality: N/A;  . ESOPHAGOGASTRODUODENOSCOPY (EGD) WITH PROPOFOL N/A 07/01/2016   Procedure: ESOPHAGOGASTRODUODENOSCOPY (EGD) WITH PROPOFOL;  Surgeon: San Jetty, MD;  Location: [facility] ENDOSCOPY;  Service: General;  Laterality: N/A;  . ESOPHAGOGASTRODUODENOSCOPY (EGD) WITH PROPOFOL N/A 12/25/2019   Procedure: ESOPHAGOGASTRODUODENOSCOPY (EGD) WITH PROPOFOL;  Surgeon: Lin Landsman, MD;  Location: [facility] ENDOSCOPY;  Service: Gastroenterology;  Laterality: N/A;  . LOWER EXTREMITY ANGIOGRAPHY Left 12/10/2019   Procedure: Lower Extremity Angiography;  Surgeon: Algernon Huxley, MD;  Location: [place] [facility];  Service: Cardiovascular;  Laterality: Left;     Social History   Tobacco Use  . Smoking status: Current Every Day Smoker    Packs/day: 0.25    Years: 5.00    Pack years: 1.25    Types: Cigars  . Smokeless tobacco: Never Used  Vaping Use  . Vaping Use: Never used  Substance Use Topics  . Alcohol use: Yes    Alcohol/week: 16.0 standard drinks    Types: 14 Cans of beer, 2  Shots of liquor per week  . Drug use: No     Family History  Problem Relation Age of Onset  . CAD Brother   . Dementia Mother   . Renal Disease Father     No family history of bleeding or clotting disorders, autoimmune disease or porphyria  Allergies  Allergen Reactions  . Ferrous Gluconate Nausea And Vomiting  . Other      REVIEW OF SYSTEMS (Negative unless checked)  Constitutional: '[]'$ Weight loss  '[]'$ Fever  '[]'$ Chills Cardiac: '[]'$ Chest pain   '[]'$ Chest pressure   '[]'$ Palpitations   '[]'$ Shortness of breath when laying flat   '[]'$ Shortness of breath at rest   '[x]'$ Shortness of breath with exertion. Vascular:  '[]'$ Pain in legs with walking   '[]'$ Pain in legs at rest   '[]'$ Pain in legs when laying flat   '[]'$ Claudication   '[]'$ Pain in feet when walking  '[]'$ Pain in feet at rest  '[]'$ Pain in feet when laying flat   '[]'$ History of DVT   '[]'$ Phlebitis   '[]'$ Swelling in legs   '[]'$ Varicose veins   '[]'$ Non-healing ulcers Pulmonary:   '[]'$ Uses home oxygen   '[]'$ Productive cough   '[]'$ Hemoptysis   '[]'$ Wheeze  '[x]'$ COPD   '[]'$ Asthma Neurologic:  '[]'$ Dizziness  '[]'$ Blackouts   '[]'$ Seizures   '[x]'$ History of stroke   '[]'$ History of TIA  '[]'$ Aphasia   '[]'$ Temporary blindness   '[]'$ Dysphagia   '[]'$ Weakness or numbness in arms   '[]'$ Weakness or numbness in legs Musculoskeletal:  '[]'$ Arthritis   '[]'$ Joint swelling   '[]'$ Joint pain   '[]'$ Low back pain Hematologic:  '[]'$ Easy bruising  '[]'$ Easy bleeding   '[]'$ Hypercoagulable state   '[x]'$ Anemic  '[]'$ Hepatitis Gastrointestinal:  '[]'$ Blood in stool   '[]'$ Vomiting blood  '[]'$ Gastroesophageal reflux/heartburn   '[]'$ Difficulty swallowing. Genitourinary:  '[x]'$ Chronic kidney disease   '[]'$ Difficult urination  '[]'$ Frequent urination  '[]'$ Burning with urination   '[]'$ Blood in urine Skin:  '[]'$ Rashes   '[]'$ Ulcers   '[]'$ Wounds Psychological:  '[]'$ History of anxiety   '[]'$  History of major depression.  Physical Examination  Vitals:   06/15/20 1127  TempSrc: Oral   There is no height or weight on file to calculate BMI. Gen: WD/WN, NAD Head: [place]/AT, No temporalis wasting.   Ear/Nose/Throat: Hearing grossly intact, nares w/o erythema or drainage, oropharynx w/o Erythema/Exudate,  Eyes: Conjunctiva clear, sclera non-icteric Neck: Trachea midline.  No JVD.  Pulmonary:  Good air movement, respirations not labored, no use of accessory muscles.  Cardiac: RRR, normal S1, S2. Vascular: no thrill in right arm AVG Vessel Right Left  Radial Palpable Palpable   Musculoskeletal: M/S 5/5 throughout.  Extremities without ischemic changes.  No deformity or atrophy.  Neurologic: Sensation grossly intact in extremities.  Symmetrical.  Speech is fluent. Motor exam as listed above. Psychiatric: Judgment intact, Mood & affect appropriate for pt's clinical situation. Dermatologic: No rashes or ulcers noted.  No cellulitis or open wounds. Lymph : No Cervical, Axillary, or Inguinal lymphadenopathy.   CBC Lab Results  Component Value  Date   WBC 4.2 04/26/2020   HGB 9.1 (L) 04/26/2020   HCT 30.0 (L) 04/26/2020   MCV 92.3 04/26/2020   PLT 171 04/26/2020    BMET    Component Value Date/Time   NA 140 04/26/2020 1300   NA 143 01/20/2014 0443   K 4.0 04/26/2020 1300   K 4.6 01/20/2014 0443   CL 102 04/26/2020 1300   CL 116 (H) 01/20/2014 0443   CO2 26 04/26/2020 1300   CO2 22 01/20/2014 0443   GLUCOSE 117 (H) 04/26/2020 1300   GLUCOSE 94 01/20/2014 0443   BUN 19 04/26/2020 1300   BUN 18 01/20/2014 0443   CREATININE 3.31 (H) 04/26/2020 1300   CREATININE 1.32 (H) 01/20/2014 0443   CALCIUM 7.7 (L) 04/26/2020 1300   CALCIUM 7.3 (L) 01/20/2014 0443   GFRNONAA 20 (L) 04/26/2020 1300   GFRNONAA >60 01/20/2014 0443   GFRAA 22 (L) 01/13/2020 0712   GFRAA >60 01/20/2014 0443   CrCl cannot be calculated (Patient's most recent lab result is older than the maximum 21 days allowed.).  COAG Lab Results  Component Value Date   INR 1.3 (H) 04/20/2020   INR 1.1 11/25/2019   INR 1.0 07/17/2019    Radiology No results found.  Assessment/Plan 1.  Complication dialysis  device with thrombosis AV access:  Patient's dialysis access is thrombosed. The patient will undergo thrombectomy using interventional techniques.  The risks and benefits were described to the patient.  All questions were answered.  The patient agrees to proceed with angiography and intervention. Potassium will be drawn to ensure that it is an appropriate level prior to performing thrombectomy. 2.  End-stage renal disease requiring hemodialysis:  Patient will continue dialysis therapy without further interruption if a successful thrombectomy is not achieved then catheter will be placed. Dialysis has already been arranged since the patient missed their previous session 3.  Hypertension:  Patient will continue medical management; nephrology is following no changes in oral medications. 4. Diabetes mellitus:  Glucose will be monitored and oral medications been held this morning once the patient has undergone the patient's procedure po intake will be reinitiated and again Accu-Cheks will be used to assess the blood glucose level and treat as needed. The patient will be restarted on the patient's usual hypoglycemic regime     Leotis Pain, MD  06/15/2020 11:34 AM

## 2020-06-15 NOTE — Op Note (Signed)
[facility]    OPERATIVE NOTE   PROCEDURE: 1.  Right brachial artery to axillary vein arteriovenous graft cannulation under ultrasound guidance in both a retrograde and then antegrade fashion crossing 2.  Right arm shuntogram and central venogram 3.  Catheter directed thrombolysis with 4 mg of TPA  4.  Mechanical thrombectomy to the right brachial artery to axillary vein with the penumbra CAT 7D 5.  Fogarty embolectomy for residual arterial plug 6.  Percutaneous transluminal angioplasty of the venous anastomosis with 8 mm diameter by 6 cm length Lutonix drug-coated angioplasty balloon  PRE-OPERATIVE DIAGNOSIS: 1. ESRD 2.  Thrombosed right brachial artery to axillary vein arteriovenous graft  POST-OPERATIVE DIAGNOSIS: same as above   SURGEON: Leotis Pain, MD  ANESTHESIA: local with Moderate Conscious Sedation for approximately 35 minutes using 1 mg of Versed and 50 mcg of Fentanyl  ESTIMATED BLOOD LOSS: 100 cc  FINDING(S): 1. Thrombosed graft with a high-grade stenosis of the venous anastomosis  SPECIMEN(S):  None  CONTRAST: 35 cc  FLUORO TIME: 2.5 minutes  INDICATIONS: Patient is a 60 y.o.male who presents with a thrombosed right brachial artery to axillary vein arteriovenous graft.  The patient is scheduled for an attempted declot and shuntogram.  The patient is aware the risks include but are not limited to: bleeding, infection, thrombosis of the cannulated access, and possible anaphylactic reaction to the contrast.  The patient is aware of the risks of the procedure and elects to proceed forward.  DESCRIPTION: After full informed written consent was obtained, the patient was brought back to the angiography suite and placed supine upon the angiography table.  The patient was connected to monitoring equipment. Moderate conscious sedation was administered with a face to face encounter with the patient throughout the procedure with my supervision of the RN  administering medicines and monitoring the patient's vital signs, pulse oximetry, telemetry and mental status throughout from the start of the procedure until the patient was taken to the recovery room. The right arm was prepped and draped in the standard fashion for a percutaneous access intervention.  Under ultrasound guidance, the right brachial artery axillary vein arteriovenous graft was cannulated with a micropuncture needle under direct ultrasound guidance due to the pulseless nature of the graft in both an antegrade and a retrograde fashion crossing, and permanent images were performed.  The microwire was advanced and the needle was exchanged for the a microsheath.  I then upsized to a 7 Fr Sheath and imaging was performed.  Hand injections were completed to image the access including the central venous system. This demonstrated no flow within the AV graft.  Based on the images, this patient will need extensive treatment to salvage the graft. I then gave the patient 3000 units of intravenous heparin.  I then placed a versa core torque wire into the brachial artery from the retrograde sheath and into the axillary vein from the antegrade sheath. 4 mg of TPA were deployed throughout the entirety of the graft. This was allowed to dwell. Mechanical thrombectomy was then performed throughout the graft and into the axillary vein using the penumbra CAT 7D catheter. This uncovered an 80 to 90% stenosis of the venous anastomosis.  A residual arterial plug was also seen at the arterial anastomosis. An attempt to clear the arterial plug was done with 2 passes of the Fogarty embolectomy balloon. This resulted in resolution of the arterial plug, and clearance of the arterial side of the graft. The retrograde sheath was  removed. I then turned my attention to the thrombus in the distal graft and the axillary vein. Mechanical thrombectomy was performed again with the penumbra CAT 7D catheter. This resulted in resolution of  the thrombus and the only remaining issue was the high-grade stenosis of the venous anastomosis.  I then elected to treat this with an 8 mm diameter by 6 cm length Lutonix drug-coated angioplasty balloon at the venous anastomosis inflated to 8 atm for 1 minute.  Completion imaging showed only about a 10 to 15% residual stenosis with brisk flow through the graft.    Based on the completion imaging, no further intervention is necessary.  The wire and balloon were removed from the sheath.  A 4-0 Monocryl purse-string suture was sewn around the sheath.  The sheath was removed while tying down the suture.  A sterile bandage was applied to the puncture site.  COMPLICATIONS: None  CONDITION: Stable   Leotis Pain 06/15/2020 1:36 PM   This note was created with Dragon Medical transcription system. Any errors in dictation are purely unintentional.

## 2020-06-15 NOTE — Progress Notes (Signed)
Pt. Arrived to specials in wheelchair with caregiver Lang Snow. Pt. Able to say name and DOB, but appears lethargic, weak. Took max assist to get pt. Over to stretcher to prepare him for procedure. . Pt. Answers questions appropriately, but "falls off to sleep " fairly easy. On O2 at 2L/[place] continuously. To call caregiver to pick up pt. After procedure.

## 2020-06-16 ENCOUNTER — Ambulatory Visit: Payer: Medicare Other | Admitting: Podiatry

## 2020-06-16 ENCOUNTER — Other Ambulatory Visit (HOSPITAL_COMMUNITY): Payer: Self-pay

## 2020-06-16 NOTE — Progress Notes (Signed)
Today had a home visit with [name] and caregiver.  His wife is at work today.  Filled [facility] med boxes up for 2 weeks.  Called in refills for sertraline and iron.  Sertraline is missing out of 1 box and is ready to be picked up.  Made a note on box and wife states she will pick it up tomorrow and place it in the box.  [name] states he did not have dialysis due to the procedure yesterday.  He is feeling and doing much better today.  He is sitting up eating breakfast.  He is wanting something put on the toilet to make it taller, will look into getting him something.  He denies any problems today, appetite is good.  Will continue to visit for heart failure and medication compliance.   [place] [phone]

## 2020-06-17 ENCOUNTER — Other Ambulatory Visit: Payer: Self-pay

## 2020-06-17 ENCOUNTER — Emergency Department: Payer: Medicare Other

## 2020-06-17 ENCOUNTER — Inpatient Hospital Stay
Admission: EM | Admit: 2020-06-17 | Discharge: 2020-06-25 | DRG: 628 | Disposition: A | Discharge status: Skilled Nursing Facility | Payer: Medicare Other | Source: Other Acute Inpatient Hospital | Attending: Internal Medicine | Admitting: Internal Medicine

## 2020-06-17 DIAGNOSIS — I132 Hypertensive heart and chronic kidney disease with heart failure and with stage 5 chronic kidney disease, or end stage renal disease: Secondary | ICD-10-CM | POA: Diagnosis present

## 2020-06-17 DIAGNOSIS — N2581 Secondary hyperparathyroidism of renal origin: Secondary | ICD-10-CM | POA: Diagnosis present

## 2020-06-17 DIAGNOSIS — Z8673 Personal history of transient ischemic attack (TIA), and cerebral infarction without residual deficits: Secondary | ICD-10-CM | POA: Diagnosis not present

## 2020-06-17 DIAGNOSIS — R680 Hypothermia, not associated with low environmental temperature: Secondary | ICD-10-CM | POA: Diagnosis present

## 2020-06-17 DIAGNOSIS — M7989 Other specified soft tissue disorders: Secondary | ICD-10-CM

## 2020-06-17 DIAGNOSIS — T383X5A Adverse effect of insulin and oral hypoglycemic [antidiabetic] drugs, initial encounter: Secondary | ICD-10-CM | POA: Diagnosis not present

## 2020-06-17 DIAGNOSIS — E785 Hyperlipidemia, unspecified: Secondary | ICD-10-CM | POA: Diagnosis present

## 2020-06-17 DIAGNOSIS — Z8249 Family history of ischemic heart disease and other diseases of the circulatory system: Secondary | ICD-10-CM

## 2020-06-17 DIAGNOSIS — M86671 Other chronic osteomyelitis, right ankle and foot: Secondary | ICD-10-CM | POA: Diagnosis present

## 2020-06-17 DIAGNOSIS — E11649 Type 2 diabetes mellitus with hypoglycemia without coma: Principal | ICD-10-CM | POA: Diagnosis present

## 2020-06-17 DIAGNOSIS — E160 Drug-induced hypoglycemia without coma: Secondary | ICD-10-CM | POA: Diagnosis not present

## 2020-06-17 DIAGNOSIS — Z992 Dependence on renal dialysis: Secondary | ICD-10-CM | POA: Diagnosis not present

## 2020-06-17 DIAGNOSIS — E1122 Type 2 diabetes mellitus with diabetic chronic kidney disease: Secondary | ICD-10-CM | POA: Diagnosis present

## 2020-06-17 DIAGNOSIS — F17290 Nicotine dependence, other tobacco product, uncomplicated: Secondary | ICD-10-CM | POA: Diagnosis present

## 2020-06-17 DIAGNOSIS — G9341 Metabolic encephalopathy: Secondary | ICD-10-CM | POA: Diagnosis present

## 2020-06-17 DIAGNOSIS — N186 End stage renal disease: Secondary | ICD-10-CM | POA: Diagnosis present

## 2020-06-17 DIAGNOSIS — E118 Type 2 diabetes mellitus with unspecified complications: Secondary | ICD-10-CM | POA: Diagnosis present

## 2020-06-17 DIAGNOSIS — E162 Hypoglycemia, unspecified: Secondary | ICD-10-CM

## 2020-06-17 DIAGNOSIS — I5022 Chronic systolic (congestive) heart failure: Secondary | ICD-10-CM | POA: Diagnosis present

## 2020-06-17 DIAGNOSIS — I340 Nonrheumatic mitral (valve) insufficiency: Secondary | ICD-10-CM | POA: Diagnosis present

## 2020-06-17 DIAGNOSIS — J9611 Chronic respiratory failure with hypoxia: Secondary | ICD-10-CM | POA: Diagnosis present

## 2020-06-17 DIAGNOSIS — T68XXXA Hypothermia, initial encounter: Secondary | ICD-10-CM

## 2020-06-17 DIAGNOSIS — Z9981 Dependence on supplemental oxygen: Secondary | ICD-10-CM | POA: Diagnosis not present

## 2020-06-17 DIAGNOSIS — Z7982 Long term (current) use of aspirin: Secondary | ICD-10-CM

## 2020-06-17 DIAGNOSIS — Z20822 Contact with and (suspected) exposure to covid-19: Secondary | ICD-10-CM | POA: Diagnosis present

## 2020-06-17 DIAGNOSIS — F32A Depression, unspecified: Secondary | ICD-10-CM | POA: Diagnosis not present

## 2020-06-17 DIAGNOSIS — Z794 Long term (current) use of insulin: Secondary | ICD-10-CM | POA: Diagnosis not present

## 2020-06-17 DIAGNOSIS — D631 Anemia in chronic kidney disease: Secondary | ICD-10-CM | POA: Diagnosis present

## 2020-06-17 DIAGNOSIS — D696 Thrombocytopenia, unspecified: Secondary | ICD-10-CM | POA: Diagnosis present

## 2020-06-17 DIAGNOSIS — Z89422 Acquired absence of other left toe(s): Secondary | ICD-10-CM

## 2020-06-17 DIAGNOSIS — I6529 Occlusion and stenosis of unspecified carotid artery: Secondary | ICD-10-CM | POA: Diagnosis present

## 2020-06-17 DIAGNOSIS — K219 Gastro-esophageal reflux disease without esophagitis: Secondary | ICD-10-CM | POA: Diagnosis present

## 2020-06-17 DIAGNOSIS — A419 Sepsis, unspecified organism: Secondary | ICD-10-CM

## 2020-06-17 DIAGNOSIS — R531 Weakness: Secondary | ICD-10-CM

## 2020-06-17 DIAGNOSIS — J189 Pneumonia, unspecified organism: Secondary | ICD-10-CM

## 2020-06-17 LAB — GLUCOSE, CAPILLARY
Glucose-Capillary: 54 mg/dL — ABNORMAL LOW (ref 70–99)
Glucose-Capillary: 126 mg/dL — ABNORMAL HIGH (ref 70–99)

## 2020-06-17 LAB — COMPREHENSIVE METABOLIC PANEL
ALT: 11 U/L (ref 0–44)
AST: 19 U/L (ref 15–41)
Albumin: 2.9 g/dL — ABNORMAL LOW (ref 3.5–5.0)
Alkaline Phosphatase: 188 U/L — ABNORMAL HIGH (ref 38–126)
Anion gap: 15 (ref 5–15)
BUN: 78 mg/dL — ABNORMAL HIGH (ref 6–20)
CO2: 22 mmol/L (ref 22–32)
Calcium: 8.4 mg/dL — ABNORMAL LOW (ref 8.9–10.3)
Chloride: 106 mmol/L (ref 98–111)
Creatinine, Ser: 7.78 mg/dL — ABNORMAL HIGH (ref 0.61–1.24)
GFR, Estimated: 7 mL/min — ABNORMAL LOW (ref >60)
Glucose, Bld: 72 mg/dL (ref 70–99)
Potassium: 5.1 mmol/L (ref 3.5–5.1)
Sodium: 143 mmol/L (ref 135–145)
Total Bilirubin: 0.5 mg/dL (ref 0.3–1.2)
Total Protein: 6.8 g/dL (ref 6.5–8.1)

## 2020-06-17 LAB — CBC WITH DIFFERENTIAL/PLATELET
Abs Immature Granulocytes: 0.01 10*3/uL (ref 0.00–0.07)
Basophils Absolute: 0 10*3/uL (ref 0.0–0.1)
Basophils Relative: 0 %
Eosinophils Absolute: 0 10*3/uL (ref 0.0–0.5)
Eosinophils Relative: 1 %
HCT: 36.6 % — ABNORMAL LOW (ref 39.0–52.0)
Hemoglobin: 10.9 g/dL — ABNORMAL LOW (ref 13.0–17.0)
Immature Granulocytes: 0 %
Lymphocytes Relative: 20 %
Lymphs Abs: 0.9 10*3/uL (ref 0.7–4.0)
MCH: 27.7 pg (ref 26.0–34.0)
MCHC: 29.8 g/dL — ABNORMAL LOW (ref 30.0–36.0)
MCV: 93.1 fL (ref 80.0–100.0)
Monocytes Absolute: 0.5 10*3/uL (ref 0.1–1.0)
Monocytes Relative: 10 %
Neutro Abs: 3.1 10*3/uL (ref 1.7–7.7)
Neutrophils Relative %: 69 %
Platelets: 93 10*3/uL — ABNORMAL LOW (ref 150–400)
RBC: 3.93 MIL/uL — ABNORMAL LOW (ref 4.22–5.81)
RDW: 20 % — ABNORMAL HIGH (ref 11.5–15.5)
WBC: 4.5 10*3/uL (ref 4.0–10.5)
nRBC: 0 % (ref 0.0–0.2)

## 2020-06-17 LAB — TROPONIN I (HIGH SENSITIVITY)
Troponin I (High Sensitivity): 9 ng/L (ref <18)
Troponin I (High Sensitivity): 8 ng/L (ref <18)

## 2020-06-17 LAB — LACTIC ACID, PLASMA
Lactic Acid, Venous: 1.9 mmol/L (ref 0.5–1.9)
Lactic Acid, Venous: 1.6 mmol/L (ref 0.5–1.9)

## 2020-06-17 LAB — CBG MONITORING, ED
Glucose-Capillary: 109 mg/dL — ABNORMAL HIGH (ref 70–99)
Glucose-Capillary: 86 mg/dL (ref 70–99)
Glucose-Capillary: 142 mg/dL — ABNORMAL HIGH (ref 70–99)
Glucose-Capillary: 99 mg/dL (ref 70–99)
Glucose-Capillary: 153 mg/dL — ABNORMAL HIGH (ref 70–99)
Glucose-Capillary: 113 mg/dL — ABNORMAL HIGH (ref 70–99)
Glucose-Capillary: 72 mg/dL (ref 70–99)

## 2020-06-17 LAB — APTT: aPTT: 36 seconds (ref 24–36)

## 2020-06-17 LAB — PROTIME-INR
INR: 1.5 — ABNORMAL HIGH (ref 0.8–1.2)
Prothrombin Time: 17.6 seconds — ABNORMAL HIGH (ref 11.4–15.2)

## 2020-06-17 LAB — SARS CORONAVIRUS 2 BY RT PCR (HOSPITAL ORDER, PERFORMED IN ~~LOC~~ HOSPITAL LAB): SARS Coronavirus 2: NEGATIVE

## 2020-06-17 MED ORDER — RENA-VITE PO TABS
1.0000 | ORAL_TABLET | Freq: Every day | ORAL | Status: DC
  Administered 2020-06-18 – 2020-06-25 (×7): 1 via ORAL
  Filled 2020-06-17 (×9): qty 1

## 2020-06-17 MED ORDER — EPOETIN ALFA 10000 UNIT/ML IJ SOLN
4000.0000 [IU] | INTRAMUSCULAR | Status: DC
  Filled 2020-06-17: qty 1

## 2020-06-17 MED ORDER — SERTRALINE HCL 50 MG PO TABS
150.0000 mg | ORAL_TABLET | Freq: Every day | ORAL | Status: DC
  Administered 2020-06-18 – 2020-06-25 (×7): 150 mg via ORAL
  Filled 2020-06-17 (×7): qty 3

## 2020-06-17 MED ORDER — ASPIRIN EC 81 MG PO TBEC
81.0000 mg | DELAYED_RELEASE_TABLET | Freq: Every day | ORAL | Status: DC
  Administered 2020-06-18 – 2020-06-25 (×7): 81 mg via ORAL
  Filled 2020-06-17 (×7): qty 1

## 2020-06-17 MED ORDER — LACTATED RINGERS IV SOLN: INTRAVENOUS | Status: DC

## 2020-06-17 MED ORDER — SODIUM CHLORIDE 0.9 % IV SOLN
2.0000 g | INTRAVENOUS | Status: DC
  Administered 2020-06-17 – 2020-06-19 (×3): 2 g via INTRAVENOUS
  Filled 2020-06-17: qty 20
  Filled 2020-06-17 (×2): qty 2

## 2020-06-17 MED ORDER — ACETAMINOPHEN 325 MG PO TABS
650.0000 mg | ORAL_TABLET | Freq: Four times a day (QID) | ORAL | Status: DC | PRN
  Administered 2020-06-21: 650 mg via ORAL
  Filled 2020-06-17: qty 2

## 2020-06-17 MED ORDER — SODIUM CHLORIDE 0.9 % IV BOLUS (SEPSIS): 500.0000 mL | Freq: Once | INTRAVENOUS | Status: DC

## 2020-06-17 MED ORDER — POLYSACCHARIDE IRON COMPLEX 150 MG PO CAPS: 150.0000 mg | ORAL_CAPSULE | Freq: Every day | ORAL | Status: DC

## 2020-06-17 MED ORDER — ACETAMINOPHEN 650 MG RE SUPP: 650.0000 mg | Freq: Four times a day (QID) | RECTAL | Status: DC | PRN

## 2020-06-17 MED ORDER — MIRTAZAPINE 15 MG PO TABS
7.5000 mg | ORAL_TABLET | Freq: Every day | ORAL | Status: DC
  Administered 2020-06-17 – 2020-06-24 (×8): 7.5 mg via ORAL
  Filled 2020-06-17 (×9): qty 1

## 2020-06-17 MED ORDER — ONDANSETRON HCL 4 MG/2ML IJ SOLN: 4.0000 mg | Freq: Four times a day (QID) | INTRAMUSCULAR | Status: DC | PRN

## 2020-06-17 MED ORDER — SACUBITRIL-VALSARTAN 24-26 MG PO TABS
1.0000 | ORAL_TABLET | Freq: Two times a day (BID) | ORAL | Status: DC
  Administered 2020-06-18 – 2020-06-25 (×14): 1 via ORAL
  Filled 2020-06-17 (×17): qty 1

## 2020-06-17 MED ORDER — HYDRALAZINE HCL 25 MG PO TABS
25.0000 mg | ORAL_TABLET | Freq: Two times a day (BID) | ORAL | Status: DC
  Administered 2020-06-17 – 2020-06-25 (×15): 25 mg via ORAL
  Filled 2020-06-17 (×15): qty 1

## 2020-06-17 MED ORDER — CARVEDILOL 3.125 MG PO TABS
6.2500 mg | ORAL_TABLET | Freq: Two times a day (BID) | ORAL | Status: DC
  Administered 2020-06-18 – 2020-06-25 (×12): 6.25 mg via ORAL
  Filled 2020-06-17: qty 2
  Filled 2020-06-17 (×2): qty 1
  Filled 2020-06-17: qty 2
  Filled 2020-06-17: qty 1
  Filled 2020-06-17 (×3): qty 2
  Filled 2020-06-17: qty 1
  Filled 2020-06-17: qty 2
  Filled 2020-06-17 (×2): qty 1

## 2020-06-17 MED ORDER — DEXTROSE-NACL 5-0.9 % IV SOLN
INTRAVENOUS | Status: AC
  Administered 2020-06-17: 500 mL via INTRAVENOUS

## 2020-06-17 MED ORDER — ONDANSETRON HCL 4 MG PO TABS: 4.0000 mg | ORAL_TABLET | Freq: Four times a day (QID) | ORAL | Status: DC | PRN

## 2020-06-17 MED ORDER — LIDOCAINE-PRILOCAINE 2.5-2.5 % EX CREA
1.0000 "application " | TOPICAL_CREAM | CUTANEOUS | Status: DC
  Filled 2020-06-17: qty 5

## 2020-06-17 MED ORDER — MELATONIN 5 MG PO TABS
5.0000 mg | ORAL_TABLET | Freq: Every day | ORAL | Status: DC
  Administered 2020-06-17 – 2020-06-25 (×9): 5 mg via ORAL
  Filled 2020-06-17 (×9): qty 1

## 2020-06-17 MED ORDER — SODIUM CHLORIDE 0.9 % IV SOLN
500.0000 mg | INTRAVENOUS | Status: DC
  Administered 2020-06-17 – 2020-06-18 (×2): 500 mg via INTRAVENOUS
  Filled 2020-06-17 (×4): qty 500

## 2020-06-17 MED ORDER — CHLORHEXIDINE GLUCONATE CLOTH 2 % EX PADS
6.0000 | MEDICATED_PAD | Freq: Every day | CUTANEOUS | Status: DC
  Administered 2020-06-18 – 2020-06-25 (×8): 6 via TOPICAL
  Filled 2020-06-17: qty 6

## 2020-06-17 MED ORDER — NEPRO/CARBSTEADY PO LIQD
237.0000 mL | Freq: Three times a day (TID) | ORAL | Status: DC
  Administered 2020-06-17 – 2020-06-25 (×17): 237 mL via ORAL

## 2020-06-17 NOTE — ED Notes (Signed)
Unable to start abx at this time due to previous IV access infiltration. Order for stat iv team placed.

## 2020-06-17 NOTE — Consult Note (Signed)
CODE SEPSIS - PHARMACY COMMUNICATION  **Broad Spectrum Antibiotics should be administered within 1 hour of Sepsis diagnosis**  Time Code Sepsis Called/Page Received: 1357  Antibiotics Ordered: ceftriaxone  Time of 1st antibiotic administration: 1551  Team was have difficulty getting IV access.   Oswald Hillock ,PharmD Clinical Pharmacist  06/17/2020  3:57 PM

## 2020-06-17 NOTE — H&P (Signed)
History and Physical    NILMAR Baker [id number] DOB: 01-13-1961 DOA: 06/17/2020  PCP: [name], MD   Patient coming from: Home  I have personally briefly reviewed patient's old medical records in [place]  Chief Complaint: AMS Most the history was obtained from patient's wife  HPI: [name] is a 60 y.o. male with medical history significant for  [name] is a 60 y.o. male with medical history significant forend-stage renal disease on hemodialysis(dialysis days are T/Th/S),insulin-dependent diabetes mellitus, hypertension, CVA, GERD who was brought into the ER by EMS from the dialysis center for evaluation of mental status changes.  Patient was found lethargic and unresponsive and his blood sugar was 26.  The staff at the dialysis center offered him oral glucose and soda.   Per notes patient has not had his dialysis treatment in over 1 week.  Upon arrival to the ER he was awake enough and they offered him snacks which he tolerated.  His wife states that he did not eat dinner last night but she gave him 6 units of Levemir at bedtime.  He also did not eat breakfast prior to going to dialysis center.  He is currently on a dextrose infusion with improvement in his blood sugars.  He is lethargic but arouses to verbal stimuli and is oriented to person and place but not to time. Unable to do a review of systems at this time. Labs show sodium 143, potassium 5.1, chloride 106, bicarb 22, glucose 72, BUN 78, creatinine 7.78, calcium 8.4, alkaline phosphatase 188, albumin 2.9, AST 19, ALT 11, total protein 6.8, lactic acid 1.9, white count 4.5, hemoglobin 10.9, hematocrit 36.6, MCV 93.1, RDW 20, platelet count 93 Chest x-ray reviewed by me shows Cardiomegaly with moderate layering bile pleural effusions and diffuse interstitial prominence, most consistent with pulmonaryedema.Overlying bibasilar opacities most likely represent atelectasis. Infection is not  excluded. Twelve-lead EKG shows sinus rhythm with nonspecific T wave changes.    ED Course: Patient is a 60 year old male who was sent to the ER from the dialysis center for evaluation of mental status changes.  He was hypoglycemic in the dialysis unit and his wife states that he received Levemir 6 units at bedtime.  Patient did not eat dinner or breakfast prior to going to dialysis.  He has had multiple hospitalizations for similar episodes of hypoglycemia.  Patient was hypothermic upon arrival to the ER with a T-max of 94.4 and is currently on a Retail banker.  He will be admitted to the hospital for further evaluation.  Review of Systems: As per HPI otherwise all systems reviewed and negative.    Past Medical History:  Diagnosis Date  . CHF (congestive heart failure) ([place])   . Chronic kidney disease   . Diabetes mellitus without complication ([place])   . ETOH abuse   . GERD (gastroesophageal reflux disease)   . Hyperlipidemia   . Hypertension   . Stroke [facility])     Past Surgical History:  Procedure Laterality Date  . AMPUTATION TOE Left 12/11/2019   Procedure: AMPUTATION LEFT 2nd TOE;  Surgeon: Sharlotte Alamo, DPM;  Location: [facility] ORS;  Service: Podiatry;  Laterality: Left;  . AV FISTULA PLACEMENT Right 11/25/2019   Procedure: INSERTION OF ARTERIOVENOUS (AV) GORE-TEX GRAFT ARM;  Surgeon: Katha Cabal, MD;  Location: [facility] ORS;  Service: Vascular;  Laterality: Right;  . COLONOSCOPY WITH PROPOFOL N/A 03/06/2019   Procedure: COLONOSCOPY WITH PROPOFOL;  Surgeon: Lucilla Lame, MD;  Location: [facility]  ENDOSCOPY;  Service: Endoscopy;  Laterality: N/A;  . DIALYSIS/PERMA CATHETER INSERTION N/A 04/10/2019   Procedure: DIALYSIS/PERMA CATHETER INSERTION;  Surgeon: Serafina Mitchell, MD;  Location: [place] [facility];  Service: Cardiovascular;  Laterality: N/A;  . DIALYSIS/PERMA CATHETER REMOVAL N/A 03/07/2020   Procedure: DIALYSIS/PERMA CATHETER REMOVAL;  Surgeon: Algernon Huxley, MD;  Location: [place] [facility];  Service: Cardiovascular;  Laterality: N/A;  . ESOPHAGOGASTRODUODENOSCOPY (EGD) WITH PROPOFOL N/A 07/01/2016   Procedure: ESOPHAGOGASTRODUODENOSCOPY (EGD) WITH PROPOFOL;  Surgeon: San Jetty, MD;  Location: [facility] ENDOSCOPY;  Service: General;  Laterality: N/A;  . ESOPHAGOGASTRODUODENOSCOPY (EGD) WITH PROPOFOL N/A 12/25/2019   Procedure: ESOPHAGOGASTRODUODENOSCOPY (EGD) WITH PROPOFOL;  Surgeon: Lin Landsman, MD;  Location: [facility] ENDOSCOPY;  Service: Gastroenterology;  Laterality: N/A;  . LOWER EXTREMITY ANGIOGRAPHY Left 12/10/2019   Procedure: Lower Extremity Angiography;  Surgeon: Algernon Huxley, MD;  Location: [place] [facility];  Service: Cardiovascular;  Laterality: Left;  . PERIPHERAL VASCULAR THROMBECTOMY N/A 06/15/2020   Procedure: PERIPHERAL VASCULAR THROMBECTOMY;  Surgeon: Algernon Huxley, MD;  Location: [place] [facility];  Service: Cardiovascular;  Laterality: N/A;     reports that he has been smoking cigars. He has a 1.25 pack-year smoking history. He has never used smokeless tobacco. He reports current alcohol use of about 16.0 standard drinks of alcohol per week. He reports that he does not use drugs.  Allergies  Allergen Reactions  . Ferrous Gluconate Nausea And Vomiting  . Other     Family History  Problem Relation Age of Onset  . CAD Brother   . Dementia Mother   . Renal Disease Father      Prior to Admission medications   Medication Sig Start Date End Date Taking? Authorizing Provider  aspirin EC 81 MG EC tablet Take 1 tablet (81 mg total) by mouth daily. 06/10/19   Jennye Boroughs, MD  carvedilol (COREG) 6.25 MG tablet Take 6.25 mg by mouth 2 (two) times daily with a meal. He is taking this medication twice daily 10/28/19   [provider]  ENTRESTO 24-26 MG Take 1 tablet by mouth 2 (two) times daily. 07/29/19   [provider]  epoetin alfa (EPOGEN) 10000 UNIT/ML injection Inject 0.4 mLs (4,000 Units total) into the vein Every  Tuesday,Thursday,and Saturday with dialysis. 01/05/20   Enzo Bi, MD  folic acid (FOLVITE) 1 MG tablet Take 1 mg by mouth daily. Patient not taking: No sig reported 03/12/19   [provider]  hydrALAZINE (APRESOLINE) 25 MG tablet Take 1 tablet (25 mg total) by mouth in the morning and at bedtime. 01/13/20   Jennye Boroughs, MD  iron polysaccharides (NIFEREX) 150 MG capsule Take 1 capsule (150 mg total) by mouth daily. 01/05/20 04/08/20  Enzo Bi, MD  LANTUS SOLOSTAR 100 UNIT/ML Solostar Pen Inject 2 Units into the skin daily. 04/22/20   Nolberto Hanlon, MD  lidocaine-prilocaine (EMLA) cream Apply 1 application topically as directed. 02/20/20   [provider]  Melatonin 5 MG CAPS Take 5 mg by mouth at bedtime.  05/23/19   [provider]  mirtazapine (REMERON) 7.5 MG tablet Take 7.5 mg by mouth at bedtime. He is taking this medication at bedtime 02/25/20   [provider]  multivitamin (RENA-VIT) TABS tablet Take 1 tablet by mouth daily.    [provider]  Nutritional Supplements (FEEDING SUPPLEMENT, NEPRO CARB STEADY,) LIQD Take 237 mLs by mouth 3 (three) times daily between meals. 07/08/19   Ezekiel Slocumb, DO  sertraline (ZOLOFT) 100 MG tablet Take 1.5 tablets (150 mg total) by mouth daily. 04/11/20   Nita Sells, MD  thiamine (VITAMIN B-1) 100 MG tablet Take 100 mg by mouth daily. Patient not taking: Reported on 06/16/2020    [provider]    Physical Exam: Vitals:   06/17/20 1516 06/17/20 1530 06/17/20 1600 06/17/20 1630  BP: 133/74 132/75 140/78 140/79  Pulse: (!) 56     Resp: '16 16 15 15  '$ Temp:      TempSrc:      SpO2: 98%     Weight:      Height:         Vitals:   06/17/20 1516 06/17/20 1530 06/17/20 1600 06/17/20 1630  BP: 133/74 132/75 140/78 140/79  Pulse: (!) 56     Resp: '16 16 15 15  '$ Temp:      TempSrc:      SpO2: 98%     Weight:      Height:        Constitutional: Patient is lethargic but arouses to  verbal stimuli.  He is oriented to person and place but not to time.  He is chronically ill-appearing. Eyes: PERRL, lids and conjunctivae pallor ENMT: Mucous membranes are moist.  Neck: normal, supple, no masses, no thyromegaly Respiratory: Bilateral air entry, no wheezing, crackles at the bases. Normal respiratory effort. No accessory muscle use.  Cardiovascular: Regular rate and rhythm, no murmurs / rubs / gallops. No extremity edema. 2+ pedal pulses. No carotid bruits.  Abdomen: no tenderness, no masses palpated. No hepatosplenomegaly. Bowel sounds positive.  Musculoskeletal: no clubbing / cyanosis. No joint deformity upper and lower extremities.  Skin: no rashes, lesions, ulcers.  Neurologic: Unable to assess Psychiatric: Unable to assess   Labs on Admission: I have personally reviewed following labs and imaging studies  CBC: Recent Labs  Lab 06/17/20 1338  WBC 4.5  NEUTROABS 3.1  HGB 10.9*  HCT 36.6*  MCV 93.1  PLT 93*   Basic Metabolic Panel: Recent Labs  Lab 06/17/20 1338  NA 143  K 5.1  CL 106  CO2 22  GLUCOSE 72  BUN 78*  CREATININE 7.78*  CALCIUM 8.4*   GFR: Estimated Creatinine Clearance: 9.1 mL/min (A) (by C-G formula based on SCr of 7.78 mg/dL (H)). Liver Function Tests: Recent Labs  Lab 06/17/20 1338  AST 19  ALT 11  ALKPHOS 188*  BILITOT 0.5  PROT 6.8  ALBUMIN 2.9*   No results for input(s): LIPASE, AMYLASE in the last 168 hours. No results for input(s): AMMONIA in the last 168 hours. Coagulation Profile: Recent Labs  Lab 06/17/20 1352  INR 1.5*   Cardiac Enzymes: No results for input(s): CKTOTAL, CKMB, CKMBINDEX, TROPONINI in the last 168 hours. BNP (last 3 results) No results for input(s): PROBNP in the last 8760 hours. HbA1C: No results for input(s): HGBA1C in the last 72 hours. CBG: Recent Labs  Lab 06/17/20 1322 06/17/20 1346 06/17/20 1425 06/17/20 1519 06/17/20 1629  GLUCAP 109* 99 142* 153* 113*   Lipid Profile: No  results for input(s): CHOL, HDL, LDLCALC, TRIG, CHOLHDL, LDLDIRECT in the last 72 hours. Thyroid Function Tests: No results for input(s): TSH, T4TOTAL, FREET4, T3FREE, THYROIDAB in the last 72 hours. Anemia Panel: No results for input(s): VITAMINB12, FOLATE, FERRITIN, TIBC, IRON, RETICCTPCT in the last 72 hours. Urine analysis:    Component Value Date/Time   COLORURINE YELLOW (A) 04/10/2020 0330   APPEARANCEUR HAZY (A) 04/10/2020 0330   APPEARANCEUR Clear  01/17/2014 1551   LABSPEC 1.017 04/10/2020 0330   LABSPEC 1.011 01/17/2014 1551   PHURINE 7.0 04/10/2020 0330   GLUCOSEU >=500 (A) 04/10/2020 0330   GLUCOSEU Negative 01/17/2014 1551   HGBUR NEGATIVE 04/10/2020 0330   BILIRUBINUR NEGATIVE 04/10/2020 0330   BILIRUBINUR Negative 01/17/2014 1551   KETONESUR NEGATIVE 04/10/2020 0330   PROTEINUR >=300 (A) 04/10/2020 0330   NITRITE NEGATIVE 04/10/2020 0330   LEUKOCYTESUR NEGATIVE 04/10/2020 0330   LEUKOCYTESUR 1+ 01/17/2014 1551    Radiological Exams on Admission: DG Chest Port 1 View  Result Date: 06/17/2020 CLINICAL DATA:  Cough. Waiting for dialysis treatment. Unresponsive. EXAM: PORTABLE CHEST 1 VIEW COMPARISON:  December 14 21. FINDINGS: Moderate bilateral layering pleural effusions. Enlargement of the cardiac silhouette. Diffuse interstitial prominence. Bibasilar opacities. No visible pneumothorax. No acute osseous abnormality. IMPRESSION: 1. Cardiomegaly with moderate layering bile pleural effusions and diffuse interstitial prominence, most consistent with pulmonary edema. 2. Overlying bibasilar opacities most likely represent atelectasis. Infection is not excluded. Electronically Signed   By: Margaretha Sheffield MD   On: 06/17/2020 14:48    EKG: Independently reviewed.  Thank you Normal sinus rhythm Nonspecific T wave changes  Assessment/Plan Principal Problem:   Acute metabolic encephalopathy Active Problems:   GERD (gastroesophageal reflux disease)   ESRD on dialysis  (HCC)   Depression   DM (diabetes mellitus), type 2 with complications (HCC)   Hypothermia   Chronic systolic CHF (congestive heart failure) (H[facility])      Acute metabolic encephalopathy Secondary to hypoglycemia Patient was noted to be lethargic and unresponsive in the dialysis center and had a blood sugar of 26 Patient is currently on dextrose infusion Expect improvement in patient's mental status following resolution of hyperglycemia    Diabetes mellitus with complications of end-stage renal disease and hypoglycemia Patient's dialysis days are Tuesday/Thursday/Saturday Patient missed his renal replacement therapy for over a week and was sent from the dialysis center today without completing his treatment We will request nephrology consult for renal replacement therapy  Patient received Levemir 6 units the night prior to his admission to his wife and did not eat supper or breakfast prior to going to dialysis resulting in symptomatic hypoglycemia Continue dextrose infusion Accu-Cheks every 4 hours until blood sugars are stable Place patient on a consistent carbohydrate diet    Chronic respiratory failure Patient has a history of chronic respiratory failure and is on 2 L of oxygen continuous Maintain pulse oximetry greater than 92%      Anemia of end-stage renal disease H&H is stable Continue iron supplementation   Depression Continue Remeron and Zoloft    Chronic systolic heart failure Last known LVEF of 45 - 50% Continue Entresto, carvedilol and hydralazine Continue renal replacement therapy    Hypothermia Unclear etiology Patient remains on a Bair hugger   Thrombocytopenia Platelet count is 93,000 No evidence of bleeding We will hold off pharmacologic DVT prophylaxis  DVT prophylaxis: SCD Code Status: Full code Family Communication: Greater than 50% of time was spent discussing patient's condition and plan of care with his wife over the  phone.  All questions and concerns have been addressed.  She verbalizes understanding and agrees with the plan.  CODE STATUS was discussed and he is a full code. Disposition Plan: Back to previous home environment Consults called: Nephrology    Collier Bullock MD [facility]     06/17/2020, 4:42 PM

## 2020-06-17 NOTE — ED Notes (Signed)
Additional IV team consult placed, awaiting IV access. ABX unable to be administered.

## 2020-06-17 NOTE — ED Notes (Addendum)
Iv abx unable to be started awaiting iv team access. AC contacted. Per [facility], she will reach out to IV team.

## 2020-06-17 NOTE — ED Notes (Signed)
Bair hugger applied to pt.  

## 2020-06-17 NOTE — ED Notes (Signed)
IV team contacted again for line placement for IV abx.

## 2020-06-17 NOTE — ED Provider Notes (Signed)
[facility] Emergency Department Provider Note   ____________________________________________   Event Date/Time   First MD Initiated Contact with Patient 06/17/20 1245     (approximate)  I have reviewed the triage vital signs and the nursing notes.   HISTORY  Chief Complaint Fatigue and Hypoglycemia    HPI [name] is a 60 y.o. male with stated past medical history of end-stage renal disease on dialysis Monday/Wednesday/Friday, CHF, and hypertension who presents for lethargy and hypoglycemia.  EMS were called after patient was found in his dialysis lobby to be altered and intermittently arousable.  Dialysis staff state that patient's blood sugar was 25 at the time.  EMS arrived and found patient blood sugar to be 77.  Patient was given 3 packs of oral glucose prior to arrival.  Patient denies any complaints at this time.  Patient states the last time he had dialysis was 1 week prior to arrival.  Patient also states that he is chronically on 2 L of oxygen via nasal cannula at home.  Patient currently denies any vision changes, tinnitus, difficulty speaking, facial droop, sore throat, chest pain, shortness of breath, abdominal pain, nausea/vomiting/diarrhea, dysuria, or weakness/numbness/paresthesias in any extremity         Past Medical History:  Diagnosis Date  . CHF (congestive heart failure) ([place])   . Chronic kidney disease   . Diabetes mellitus without complication ([place])   . ETOH abuse   . GERD (gastroesophageal reflux disease)   . Hyperlipidemia   . Hypertension   . Stroke [facility])     Patient Active Problem List   Diagnosis Date Noted  . Aspiration pneumonia ([place]) 04/20/2020  . Acute respiratory failure ([place]) 04/20/2020  . Hypoglycemia due to insulin 04/08/2020  . Lactic acidosis 04/08/2020  . Anemia due to end stage renal disease ([place]) 04/08/2020  . Acute on chronic systolic CHF (congestive heart failure) ([place]) 01/10/2020  . Severe  mitral regurgitation 01/10/2020  . Acute on chronic congestive heart failure ([place])   . Acute on chronic respiratory failure with hypoxemia ([place]) 12/29/2019  . Aspiration pneumonia of both lower lobes due to gastric secretions ([place]) 12/29/2019  . Diabetic foot infection ([place]) 12/07/2019  . Acute pulmonary edema ([place]) 12/07/2019  . Paronychia of second toe of left foot 11/09/2019  . Chronic osteomyelitis of toe of left foot ([place]) 11/09/2019  . DM (diabetes mellitus), type 2 with complications ([place]) [id number]  . Hypoglycemia associated with diabetes ([place]) 08/24/2019  . Acute respiratory failure with hypoxia ([place])   . Acute metabolic encephalopathy [id number]  . Hypertensive emergency 07/06/2019  . Acute encephalopathy 07/06/2019  . Seizures due to metabolic disorder ([place]) [id number]  . Hyperosmolar hyperglycemic state (HHS) ([place]) 06/05/2019  . Symptomatic anemia 06/04/2019  . Frequent falls 06/04/2019  . History of CVA (cerebrovascular accident) 06/04/2019  . HCAP (healthcare-associated pneumonia) 06/04/2019  . Acute blood loss anemia 06/04/2019  . Hyperglycemia 05/28/2019  . Fall at home, initial encounter 05/28/2019  . Rib fracture 05/28/2019  . Depression 05/28/2019  . Hypokalemia 05/28/2019  . Weakness 05/27/2019  . ESRD on dialysis ([place])   . Acute renal failure (ARF) ([place]) 04/09/2019  . Polyp of ascending colon   . Diarrhea   . AKI (acute kidney injury) ([place]) 03/04/2019  . Acute kidney injury ([place]) 07/26/2018  . ARF (acute renal failure) ([place]) 07/25/2018  . Bilateral leg numbness 03/19/2018  . Numbness and tingling of both feet 03/19/2018  . CVA (cerebral vascular accident) ([place]) 03/17/2018  .  Moderate episode of recurrent major depressive disorder ([place]) 03/11/2018  . UTI (urinary tract infection) 02/27/2018  . Hypoglycemia 01/15/2018  . Type 2 diabetes mellitus without complication, with long-term current use of insulin ([place]) 08/27/2017  . Protein-calorie malnutrition,  severe 08/19/2017  . Pancreatitis, acute 08/16/2017  . DKA (diabetic ketoacidoses) 08/16/2017  . HTN (hypertension) 08/16/2017  . HLD (hyperlipidemia) 08/16/2017  . Carotid stenosis 08/02/2016  . GERD (gastroesophageal reflux disease) 08/02/2016  . History of esophagogastroduodenoscopy (EGD) 07/01/2016  . History of recent blood transfusion 07/01/2016  . Acute renal failure superimposed on stage 4 chronic kidney disease ([place]) 07/01/2016  . Hyponatremia 07/01/2016  . Uncontrolled diabetes mellitus ([place]) 07/01/2016  . Alcohol abuse 07/01/2016  . Monilial esophagitis ([place]) 07/01/2016  . Tobacco abuse 07/01/2016  . Confusion 06/29/2016  . Iron deficiency anemia 06/29/2016  . Coagulopathy ([place]) 06/29/2016    Past Surgical History:  Procedure Laterality Date  . AMPUTATION TOE Left 12/11/2019   Procedure: AMPUTATION LEFT 2nd TOE;  Surgeon: Sharlotte Alamo, DPM;  Location: [facility] ORS;  Service: Podiatry;  Laterality: Left;  . AV FISTULA PLACEMENT Right 11/25/2019   Procedure: INSERTION OF ARTERIOVENOUS (AV) GORE-TEX GRAFT ARM;  Surgeon: Katha Cabal, MD;  Location: [facility] ORS;  Service: Vascular;  Laterality: Right;  . COLONOSCOPY WITH PROPOFOL N/A 03/06/2019   Procedure: COLONOSCOPY WITH PROPOFOL;  Surgeon: Lucilla Lame, MD;  Location: [facility] ENDOSCOPY;  Service: Endoscopy;  Laterality: N/A;  . DIALYSIS/PERMA CATHETER INSERTION N/A 04/10/2019   Procedure: DIALYSIS/PERMA CATHETER INSERTION;  Surgeon: Serafina Mitchell, MD;  Location: [place] CV LAB;  Service: Cardiovascular;  Laterality: N/A;  . DIALYSIS/PERMA CATHETER REMOVAL N/A 03/07/2020   Procedure: DIALYSIS/PERMA CATHETER REMOVAL;  Surgeon: Algernon Huxley, MD;  Location: [place] CV LAB;  Service: Cardiovascular;  Laterality: N/A;  . ESOPHAGOGASTRODUODENOSCOPY (EGD) WITH PROPOFOL N/A 07/01/2016   Procedure: ESOPHAGOGASTRODUODENOSCOPY (EGD) WITH PROPOFOL;  Surgeon: San Jetty, MD;  Location: [facility] ENDOSCOPY;  Service: General;   Laterality: N/A;  . ESOPHAGOGASTRODUODENOSCOPY (EGD) WITH PROPOFOL N/A 12/25/2019   Procedure: ESOPHAGOGASTRODUODENOSCOPY (EGD) WITH PROPOFOL;  Surgeon: Lin Landsman, MD;  Location: [facility] ENDOSCOPY;  Service: Gastroenterology;  Laterality: N/A;  . LOWER EXTREMITY ANGIOGRAPHY Left 12/10/2019   Procedure: Lower Extremity Angiography;  Surgeon: Algernon Huxley, MD;  Location: [place] CV LAB;  Service: Cardiovascular;  Laterality: Left;  . PERIPHERAL VASCULAR THROMBECTOMY N/A 06/15/2020   Procedure: PERIPHERAL VASCULAR THROMBECTOMY;  Surgeon: Algernon Huxley, MD;  Location: [place] CV LAB;  Service: Cardiovascular;  Laterality: N/A;    Prior to Admission medications   Medication Sig Start Date End Date Taking? Authorizing Provider  aspirin EC 81 MG EC tablet Take 1 tablet (81 mg total) by mouth daily. 06/10/19   Jennye Boroughs, MD  carvedilol (COREG) 6.25 MG tablet Take 6.25 mg by mouth 2 (two) times daily with a meal. He is taking this medication twice daily 10/28/19   [provider]  ENTRESTO 24-26 MG Take 1 tablet by mouth 2 (two) times daily. 07/29/19   [provider]  epoetin alfa (EPOGEN) 10000 UNIT/ML injection Inject 0.4 mLs (4,000 Units total) into the vein Every Tuesday,Thursday,and Saturday with dialysis. 01/05/20   Enzo Bi, MD  folic acid (FOLVITE) 1 MG tablet Take 1 mg by mouth daily. Patient not taking: No sig reported 03/12/19   [provider]  hydrALAZINE (APRESOLINE) 25 MG tablet Take 1 tablet (25 mg total) by mouth in the morning and at bedtime. 01/13/20   Jennye Boroughs, MD  iron polysaccharides (NIFEREX) 150 MG capsule Take 1 capsule (150 mg total) by mouth daily. 01/05/20 04/08/20  Enzo Bi, MD  LANTUS SOLOSTAR 100 UNIT/ML Solostar Pen Inject 2 Units into the skin daily. 04/22/20   Nolberto Hanlon, MD  lidocaine-prilocaine (EMLA) cream Apply 1 application topically as directed. 02/20/20   [provider]  Melatonin 5 MG CAPS Take 5 mg by  mouth at bedtime.  05/23/19   [provider]  mirtazapine (REMERON) 7.5 MG tablet Take 7.5 mg by mouth at bedtime. He is taking this medication at bedtime 02/25/20   [provider]  multivitamin (RENA-VIT) TABS tablet Take 1 tablet by mouth daily.    [provider]  Nutritional Supplements (FEEDING SUPPLEMENT, NEPRO CARB STEADY,) LIQD Take 237 mLs by mouth 3 (three) times daily between meals. 07/08/19   Ezekiel Slocumb, DO  sertraline (ZOLOFT) 100 MG tablet Take 1.5 tablets (150 mg total) by mouth daily. 04/11/20   Nita Sells, MD  thiamine (VITAMIN B-1) 100 MG tablet Take 100 mg by mouth daily. Patient not taking: Reported on 06/16/2020    [provider]    Allergies Ferrous gluconate and Other  Family History  Problem Relation Age of Onset  . CAD Brother   . Dementia Mother   . Renal Disease Father     Social History Social History   Tobacco Use  . Smoking status: Current Every Day Smoker    Packs/day: 0.25    Years: 5.00    Pack years: 1.25    Types: Cigars  . Smokeless tobacco: Never Used  Vaping Use  . Vaping Use: Never used  Substance Use Topics  . Alcohol use: Yes    Alcohol/week: 16.0 standard drinks    Types: 14 Cans of beer, 2 Shots of liquor per week  . Drug use: No    Review of Systems Constitutional: No fever/chills Eyes: No visual changes. ENT: No sore throat. Cardiovascular: Denies chest pain. Respiratory: Denies shortness of breath. Gastrointestinal: No abdominal pain.  No nausea, no vomiting.  No diarrhea. Genitourinary: Negative for dysuria. Musculoskeletal: Negative for acute arthralgias Skin: Negative for rash. Neurological: Negative for headaches, weakness/numbness/paresthesias in any extremity Psychiatric: Negative for suicidal ideation/homicidal ideation   ____________________________________________   PHYSICAL EXAM:  VITAL SIGNS: ED Triage Vitals  Enc Vitals Group     BP 06/17/20 1307  124/74     Pulse Rate 06/17/20 1307 (!) 58     Resp 06/17/20 1307 16     Temp 06/17/20 1336 (!) 94.4 F (34.7 C)     Temp Source 06/17/20 1336 Rectal     SpO2 06/17/20 1307 96 %     Weight 06/17/20 1249 140 lb 11.2 oz (63.8 kg)     Height 06/17/20 1249 '5\' 11"'$  (1.803 m)     Head Circumference --      Peak Flow --      Pain Score 06/17/20 1248 0     Pain Loc --      Pain Edu? --      Excl. in [place]? --    Constitutional: Alert and oriented. Well appearing cachectic adult male in no acute distress. Eyes: Conjunctivae are normal. PERRL. Head: Atraumatic. Nose: No congestion/rhinnorhea. Mouth/Throat: Mucous membranes are moist. Neck: No stridor Cardiovascular: Grossly normal heart sounds.  Good peripheral circulation. Respiratory: Normal respiratory effort.  No retractions. Gastrointestinal: Soft and nontender. No distention. Musculoskeletal: No obvious deformities Neurologic:  Normal speech and language. No gross focal neurologic deficits are  appreciated. Skin:  Skin is warm and dry. No rash noted. Psychiatric: Mood and affect are normal. Speech and behavior are normal.  ____________________________________________   LABS (all labs ordered are listed, but only abnormal results are displayed)  Labs Reviewed  COMPREHENSIVE METABOLIC PANEL - Abnormal; Notable for the following components:      Result Value   BUN 78 (*)    Creatinine, Ser 7.78 (*)    Calcium 8.4 (*)    Albumin 2.9 (*)    Alkaline Phosphatase 188 (*)    GFR, Estimated 7 (*)    All other components within normal limits  CBC WITH DIFFERENTIAL/PLATELET - Abnormal; Notable for the following components:   RBC 3.93 (*)    Hemoglobin 10.9 (*)    HCT 36.6 (*)    MCHC 29.8 (*)    RDW 20.0 (*)    Platelets 93 (*)    All other components within normal limits  PROTIME-INR - Abnormal; Notable for the following components:   Prothrombin Time 17.6 (*)    INR 1.5 (*)    All other components within normal limits  CBG  MONITORING, ED - Abnormal; Notable for the following components:   Glucose-Capillary 109 (*)    All other components within normal limits  CBG MONITORING, ED - Abnormal; Notable for the following components:   Glucose-Capillary 142 (*)    All other components within normal limits  CBG MONITORING, ED - Abnormal; Notable for the following components:   Glucose-Capillary 153 (*)    All other components within normal limits  SARS CORONAVIRUS 2 BY RT PCR (HOSPITAL ORDER, [place] [facility])  CULTURE, BLOOD (ROUTINE X 2)  CULTURE, BLOOD (ROUTINE X 2)  URINE CULTURE  LACTIC ACID, PLASMA  APTT  URINALYSIS, COMPLETE (UACMP) WITH MICROSCOPIC  LACTIC ACID, PLASMA  CBG MONITORING, ED  CBG MONITORING, ED  TROPONIN I (HIGH SENSITIVITY)  TROPONIN I (HIGH SENSITIVITY)   ____________________________________________  EKG  ED ECG REPORT I, Naaman Plummer, the attending physician, personally viewed and interpreted this ECG.  Date: 06/17/2020 EKG Time: 1249 Rate: 60 Rhythm: normal sinus rhythm QRS Axis: normal Intervals: normal ST/T Wave abnormalities: normal Narrative Interpretation: no evidence of acute ischemia  ____________________________________________  RADIOLOGY  ED MD interpretation: Single view portable x-ray of the chest shows cardiomegaly with bilateral pleural effusions as well as bibasilar opacities  Official radiology report(s): DG Chest Port 1 View  Result Date: 06/17/2020 CLINICAL DATA:  Cough. Waiting for dialysis treatment. Unresponsive. EXAM: PORTABLE CHEST 1 VIEW COMPARISON:  December 14 21. FINDINGS: Moderate bilateral layering pleural effusions. Enlargement of the cardiac silhouette. Diffuse interstitial prominence. Bibasilar opacities. No visible pneumothorax. No acute osseous abnormality. IMPRESSION: 1. Cardiomegaly with moderate layering bile pleural effusions and diffuse interstitial prominence, most consistent with pulmonary edema. 2. Overlying  bibasilar opacities most likely represent atelectasis. Infection is not excluded. Electronically Signed   By: Margaretha Sheffield MD   On: 06/17/2020 14:48    ____________________________________________   PROCEDURES  Procedure(s) performed (including Critical Care):  Procedures   ____________________________________________   INITIAL IMPRESSION / ASSESSMENT AND PLAN / ED COURSE  As part of my medical decision making, I reviewed the following data within the [place] notes reviewed and incorporated, Labs reviewed, EKG interpreted, Old chart reviewed, Radiograph reviewed and Notes from prior ED visits reviewed and incorporated        Presents with shortness of breath, cough, and malaise concerning for pneumonia.  DDx: PE, COPD  exacerbation, Pneumothorax, TB, Atypical ACS, Esophageal Rupture, Toxic Exposure, Foreign Body Airway Obstruction.  Workup: CXR CBC, CMP, lactate, troponin  Given History, Exam, and Workup presentation most consistent with pneumonia.  Findings: Chest x-ray showing bilateral basilar opacities as well as edema  Tx: Ceftriaxone 1g IV Azithromycin '500mg'$  IV  1102 Reassessment: As patient is continuing to require supplemental oxygenation for acute hypoxic respiratory failure, patient will require admission to the internal medicine service for further evaluation and management  Disposition: Admit      ____________________________________________   FINAL CLINICAL IMPRESSION(S) / ED DIAGNOSES  Final diagnoses:  Generalized weakness  Hypoglycemia  Hypothermia, initial encounter  Sepsis, due to unspecified organism, unspecified whether acute organ dysfunction present [facility])  Healthcare-associated pneumonia     ED Discharge Orders    None       Note:  This document was prepared using Dragon voice recognition software and may include unintentional dictation errors.   Naaman Plummer, MD 06/17/20 [phone]

## 2020-06-17 NOTE — ED Notes (Addendum)
Patient assisted to drink juice and eat snacks. Tolerating PO intake at this time. Remains alert.

## 2020-06-17 NOTE — ED Notes (Signed)
Order placed for IV team d/t multiple RN's unable to start successful IV line.

## 2020-06-17 NOTE — ED Triage Notes (Signed)
Per EMS, 60 y/o m presenting with a cc of lethargy and hypoglycemia. Pt waiting for dialysis tx and was noted unresponsive with a BGL of 26. Given oral glucose and then awakened and able to tolerate PO soda. On 2L of O2 at home. Reports last ate yesterday. Taking medications as prescribed. Has not had dialysis in 1 week.

## 2020-06-18 ENCOUNTER — Encounter: Payer: Self-pay | Admitting: Internal Medicine

## 2020-06-18 LAB — CBC
HCT: 27.5 % — ABNORMAL LOW (ref 39.0–52.0)
Hemoglobin: 8.6 g/dL — ABNORMAL LOW (ref 13.0–17.0)
MCH: 28.2 pg (ref 26.0–34.0)
MCHC: 31.3 g/dL (ref 30.0–36.0)
MCV: 90.2 fL (ref 80.0–100.0)
Platelets: 81 10*3/uL — ABNORMAL LOW (ref 150–400)
RBC: 3.05 MIL/uL — ABNORMAL LOW (ref 4.22–5.81)
RDW: 19.8 % — ABNORMAL HIGH (ref 11.5–15.5)
WBC: 4.7 10*3/uL (ref 4.0–10.5)
nRBC: 0 % (ref 0.0–0.2)

## 2020-06-18 LAB — BASIC METABOLIC PANEL
Anion gap: 10 (ref 5–15)
BUN: 46 mg/dL — ABNORMAL HIGH (ref 6–20)
CO2: 23 mmol/L (ref 22–32)
Calcium: 7.7 mg/dL — ABNORMAL LOW (ref 8.9–10.3)
Chloride: 105 mmol/L (ref 98–111)
Creatinine, Ser: 5.25 mg/dL — ABNORMAL HIGH (ref 0.61–1.24)
GFR, Estimated: 12 mL/min — ABNORMAL LOW (ref >60)
Glucose, Bld: 235 mg/dL — ABNORMAL HIGH (ref 70–99)
Potassium: 4.1 mmol/L (ref 3.5–5.1)
Sodium: 138 mmol/L (ref 135–145)

## 2020-06-18 LAB — GLUCOSE, CAPILLARY
Glucose-Capillary: 125 mg/dL — ABNORMAL HIGH (ref 70–99)
Glucose-Capillary: 214 mg/dL — ABNORMAL HIGH (ref 70–99)
Glucose-Capillary: 217 mg/dL — ABNORMAL HIGH (ref 70–99)
Glucose-Capillary: 235 mg/dL — ABNORMAL HIGH (ref 70–99)
Glucose-Capillary: 253 mg/dL — ABNORMAL HIGH (ref 70–99)
Glucose-Capillary: 321 mg/dL — ABNORMAL HIGH (ref 70–99)
Glucose-Capillary: 471 mg/dL — ABNORMAL HIGH (ref 70–99)

## 2020-06-18 LAB — HEMOGLOBIN A1C
Hgb A1c MFr Bld: 7.8 % — ABNORMAL HIGH (ref 4.8–5.6)
Mean Plasma Glucose: 177.16 mg/dL

## 2020-06-18 LAB — MRSA PCR SCREENING: MRSA by PCR: NEGATIVE

## 2020-06-18 MED ORDER — INSULIN ASPART 100 UNIT/ML ~~LOC~~ SOLN
0.0000 [IU] | Freq: Three times a day (TID) | SUBCUTANEOUS | Status: DC
  Administered 2020-06-21 (×2): 3 [IU] via SUBCUTANEOUS
  Administered 2020-06-22: 2 [IU] via SUBCUTANEOUS
  Filled 2020-06-18 (×3): qty 1

## 2020-06-18 MED ORDER — INSULIN GLARGINE 100 UNIT/ML ~~LOC~~ SOLN
3.0000 [IU] | Freq: Every day | SUBCUTANEOUS | Status: DC
  Administered 2020-06-18: 3 [IU] via SUBCUTANEOUS
  Filled 2020-06-18 (×2): qty 0.03

## 2020-06-18 MED ORDER — INSULIN ASPART 100 UNIT/ML ~~LOC~~ SOLN
0.0000 [IU] | Freq: Every day | SUBCUTANEOUS | Status: DC
  Administered 2020-06-18: 5 [IU] via SUBCUTANEOUS
  Administered 2020-06-19: 3 [IU] via SUBCUTANEOUS
  Administered 2020-06-21: 2 [IU] via SUBCUTANEOUS
  Filled 2020-06-18 (×3): qty 1

## 2020-06-18 NOTE — Progress Notes (Signed)
[facility]  ROUNDING NOTE   Subjective:  [name] well-known to our practice, with history of end-stage renal disease on hemodialysis Tuesdays Thursdays and Saturdays.  He was sent to the ED from his dialysis center due to altered mental status.  He was hypoglycemic with blood sugar 26 on arrival.  Patient missed his dialysis treatment for over 1 week .  Objective:  Vital signs in last 24 hours:  Temp:  [97.5 F (36.4 C)-99 F (37.2 C)] 98.4 F (36.9 C) (02/05 1200) Pulse Rate:  [51-87] 65 (02/05 1200) Resp:  [15-24] 19 (02/05 1200) BP: (98-143)/(67-117) 125/81 (02/05 1200) SpO2:  [90 %-99 %] 94 % (02/05 1200) Weight:  [59.8 kg] 59.8 kg (02/05 0100)  Weight change:  Filed Weights   06/17/20 1249 06/18/20 0100  Weight: 63.8 kg 59.8 kg    Intake/Output: I/O last 3 completed shifts: In: 587.8 [NG/GT:237; IV Piggyback:350.8] Out: 992 [Other:992]   Intake/Output this shift:  Total I/O In: 60 [P.O.:60] Out: -   Physical Exam: General:  Resting in bed, awake, alert  Head: Normocephalic, atraumatic. Moist oral mucosal membranes  Eyes: Anicteric  Lungs:  Clear to auscultation, respirations even and unlabored  Heart: Regular rate and rhythm  Abdomen:  Soft, nontender, nondistended  Extremities:  No peripheral edema.  Neurologic: Nonfocal, moving all four extremities  Skin: No lesions or rashes  Access: Rt AVG    Basic Metabolic Panel: Recent Labs  Lab 06/17/20 1338 06/18/20 1117  NA 143 138  K 5.1 4.1  CL 106 105  CO2 22 23  GLUCOSE 72 235*  BUN 78* 46*  CREATININE 7.78* 5.25*  CALCIUM 8.4* 7.7*    Liver Function Tests: Recent Labs  Lab 06/17/20 1338  AST 19  ALT 11  ALKPHOS 188*  BILITOT 0.5  PROT 6.8  ALBUMIN 2.9*   No results for input(s): LIPASE, AMYLASE in the last 168 hours. No results for input(s): AMMONIA in the last 168 hours.  CBC: Recent Labs  Lab 06/17/20 1338 06/18/20 1117  WBC 4.5 4.7  NEUTROABS 3.1  --   HGB  10.9* 8.6*  HCT 36.6* 27.5*  MCV 93.1 90.2  PLT 93* 81*    Cardiac Enzymes: No results for input(s): CKTOTAL, CKMB, CKMBINDEX, TROPONINI in the last 168 hours.  BNP: Invalid input(s): POCBNP  CBG: Recent Labs  Lab 06/17/20 2026 06/17/20 2124 06/18/20 0439 06/18/20 0811 06/18/20 1209  GLUCAP 54* 126* 125* 235* 214*    Microbiology: Results for orders placed or performed during the hospital encounter of 06/17/20  Blood Culture (routine x 2)     Status: None (Preliminary result)   Collection Time: 06/17/20  2:09 PM   Specimen: BLOOD  Result Value Ref Range Status   Specimen Description BLOOD LEFT ANTECUBITAL  Final   Special Requests   Final    BOTTLES DRAWN AEROBIC AND ANAEROBIC Blood Culture adequate volume   Culture   Final    NO GROWTH < 24 HOURS Performed at [facility], [address] [place], [place] [address]    Report Status PENDING  Incomplete  Blood Culture (routine x 2)     Status: None (Preliminary result)   Collection Time: 06/17/20  2:09 PM   Specimen: BLOOD  Result Value Ref Range Status   Specimen Description BLOOD BLOOD LEFT FOREARM  Final   Special Requests   Final    BOTTLES DRAWN AEROBIC AND ANAEROBIC Blood Culture adequate volume   Culture   Final  NO GROWTH < 24 HOURS Performed at [facility], [address] [place], [place] [address]    Report Status PENDING  Incomplete  SARS Coronavirus 2 by RT PCR (hospital order, performed in [facility] hospital lab) Nasopharyngeal Nasopharyngeal Swab     Status: None   Collection Time: 06/17/20  3:15 PM   Specimen: Nasopharyngeal Swab  Result Value Ref Range Status   SARS Coronavirus 2 NEGATIVE NEGATIVE Final    Comment: (NOTE) SARS-CoV-2 target nucleic acids are NOT DETECTED.  The SARS-CoV-2 RNA is generally detectable in upper and lower respiratory specimens during the acute phase of infection. The lowest concentration of SARS-CoV-2 viral copies this assay can detect is  250 copies / mL. A negative result does not preclude SARS-CoV-2 infection and should not be used as the sole basis for treatment or other patient management decisions.  A negative result may occur with improper specimen collection / handling, submission of specimen other than nasopharyngeal swab, presence of viral mutation(s) within the areas targeted by this assay, and inadequate number of viral copies (<250 copies / mL). A negative result must be combined with clinical observations, patient history, and epidemiological information.  Fact Sheet for Patients:   StrictlyIdeas.no  Fact Sheet for Healthcare Providers: BankingDealers.co.za  This test is not yet approved or  cleared by the [place] FDA and has been authorized for detection and/or diagnosis of SARS-CoV-2 by FDA under an Emergency Use Authorization (EUA).  This EUA will remain in effect (meaning this test can be used) for the duration of the COVID-19 declaration under Section 564(b)(1) of the Act, 21 U.S.C. section 360bbb-3(b)(1), unless the authorization is terminated or revoked sooner.  Performed at [facility], [address] [place], [place] [address]   MRSA PCR Screening     Status: None   Collection Time: 06/18/20  1:37 AM   Specimen: Nasal Mucosa; Nasopharyngeal  Result Value Ref Range Status   MRSA by PCR NEGATIVE NEGATIVE Final    Comment:        The GeneXpert MRSA Assay (FDA approved for NASAL specimens only), is one component of a comprehensive MRSA colonization surveillance program. It is not intended to diagnose MRSA infection nor to guide or monitor treatment for MRSA infections. Performed at [facility], [address] [place], [place] [address]     Coagulation Studies: Recent Labs    06/17/20 1352  LABPROT 17.6*  INR 1.5*    Urinalysis: No results for input(s): COLORURINE, LABSPEC, PHURINE, GLUCOSEU, HGBUR,  BILIRUBINUR, KETONESUR, PROTEINUR, UROBILINOGEN, NITRITE, LEUKOCYTESUR in the last 72 hours.  Invalid input(s): APPERANCEUR    Imaging: DG Chest Port 1 View  Result Date: 06/17/2020 CLINICAL DATA:  Cough. Waiting for dialysis treatment. Unresponsive. EXAM: PORTABLE CHEST 1 VIEW COMPARISON:  December 14 21. FINDINGS: Moderate bilateral layering pleural effusions. Enlargement of the cardiac silhouette. Diffuse interstitial prominence. Bibasilar opacities. No visible pneumothorax. No acute osseous abnormality. IMPRESSION: 1. Cardiomegaly with moderate layering bile pleural effusions and diffuse interstitial prominence, most consistent with pulmonary edema. 2. Overlying bibasilar opacities most likely represent atelectasis. Infection is not excluded. Electronically Signed   By: Margaretha Sheffield MD   On: 06/17/2020 14:48     Medications:   . azithromycin Stopped (06/17/20 1740)  . cefTRIAXone (ROCEPHIN)  IV 2 g (06/18/20 1438)   . aspirin EC  81 mg Oral Daily  . carvedilol  6.25 mg Oral BID WC  . Chlorhexidine Gluconate Cloth  6 each Topical Q0600  .  epoetin alfa  4,000 Units Intravenous Q T,Th,Sa-HD  . feeding supplement (NEPRO CARB STEADY)  237 mL Oral TID BM  . hydrALAZINE  25 mg Oral BID  . insulin glargine  3 Units Subcutaneous QHS  . lidocaine-prilocaine  1 application Topical UD  . melatonin  5 mg Oral QHS  . mirtazapine  7.5 mg Oral QHS  . multivitamin  1 tablet Oral Daily  . sacubitril-valsartan  1 tablet Oral BID  . sertraline  150 mg Oral Daily   acetaminophen **OR** acetaminophen, ondansetron **OR** ondansetron (ZOFRAN) IV  Assessment/ Plan:  [name] is a 60 y.o.  male with history of end-stage renal disease on hemodialysis Tuesdays Thursdays and Saturdays.  He was sent to the ED from his dialysis center due to altered mental status.  He was hypoglycemic with blood sugar 26 on arrival.  Patient missed his dialysis treatment for over 1 week  Prior to this  admission..  # ESRD on Dialysis TTS Patient missed his dialysis treatment over a week Received dialysis treatment with UF 992 ml yesterday No acute indication for additional dialysis today  #Anemia with CKD Lab Results  Component Value Date   HGB 8.6 (L) 06/18/2020  We will continue Epogen with HD  #Secondary Hyperparathyroidism Lab Results  Component Value Date   PTH 100 (H) 04/20/2020   CALCIUM 7.7 (L) 06/18/2020   CAION 1.00 (L) 12/25/2019   PHOS 3.1 04/21/2020  Will continue monitoring bone mineral metabolism parameters  #Diabetes type 2 with CKD Patient presented with severe hypoglycemia, with blood sugar 26 Blood glucose readings within acceptable range today Management per primary team    LOS: 1 Divine Hansley 2/5/20223:05 PM

## 2020-06-18 NOTE — Progress Notes (Addendum)
Progress Note    [name]  [id number] DOB: 07-Feb-1961  DOA: 06/17/2020 PCP: [name], MD      Brief Narrative:    Medical records reviewed and are as summarized below:  [name] is a 60 y.o. male       Assessment/Plan:   Principal Problem:   Acute metabolic encephalopathy Active Problems:   GERD (gastroesophageal reflux disease)   ESRD on dialysis ([place])   Depression   DM (diabetes mellitus), type 2 with complications ([place])   Hypothermia   Chronic systolic CHF (congestive heart failure) ([place])    Body mass index is 18.38 kg/m.    Diet Order            Diet renal/carb modified with fluid restriction Diet-HS Snack? Nothing; Fluid restriction: 1200 mL Fluid; Room service appropriate? Yes; Fluid consistency: Thin  Diet effective now                  Acute metabolic encephalopathy secondary to hypoglycemia: Mental status has improved.  Insulin-dependent diabetes mellitus with recent hypoglycemia: He was taking Lantus 6 units nightly at home.  Decrease Lantus to 3 units nightly.  Hypothermia: Resolved.  This was likely due to hypoglycemia.  No evidence of infection at this time.  Discontinue empiric IV antibiotics.  Chronic systolic CHF, severe mitral regurgitation: Compensated.  Last known EF on 12/07/2019 was 45 to 50%.  Continue Entresto and carvedilol.  ESRD: Follow-up with nephrologist for hemodialysis  Chronic hypoxic respiratory failure: He uses 2 L/min oxygen at home.  Thrombocytopenia: Monitor CBC  History of stroke: Continue aspirin   Other comorbidities include anemia of chronic disease, depression, carotid artery stenosis, hyperlipidemia    Consultants:  Nephrologist  Procedures:  None    Medications:   . aspirin EC  81 mg Oral Daily  . carvedilol  6.25 mg Oral BID WC  . Chlorhexidine Gluconate Cloth  6 each Topical Q0600  . epoetin alfa  4,000 Units Intravenous Q T,Th,Sa-HD  . feeding supplement  (NEPRO CARB STEADY)  237 mL Oral TID BM  . hydrALAZINE  25 mg Oral BID  . insulin glargine  3 Units Subcutaneous QHS  . lidocaine-prilocaine  1 application Topical UD  . melatonin  5 mg Oral QHS  . mirtazapine  7.5 mg Oral QHS  . multivitamin  1 tablet Oral Daily  . sacubitril-valsartan  1 tablet Oral BID  . sertraline  150 mg Oral Daily   Continuous Infusions: . azithromycin Stopped (06/17/20 1740)  . cefTRIAXone (ROCEPHIN)  IV 2 g (06/18/20 1438)     Anti-infectives (From admission, onward)   Start     Dose/Rate Route Frequency Ordered Stop   06/17/20 1400  cefTRIAXone (ROCEPHIN) 2 g in sodium chloride 0.9 % 100 mL IVPB        2 g 200 mL/hr over 30 Minutes Intravenous Every 24 hours 06/17/20 1352     06/17/20 1400  azithromycin (ZITHROMAX) 500 mg in sodium chloride 0.9 % 250 mL IVPB        500 mg 250 mL/hr over 60 Minutes Intravenous Every 24 hours 06/17/20 1352               Family Communication/Anticipated D/C date and plan/Code Status   DVT prophylaxis: SCDs Start: 06/17/20 1700     Code Status: Full Code  Family Communication: None Disposition Plan:    Status is: Inpatient  Remains inpatient appropriate because:Inpatient level of care appropriate due to  severity of illness   Dispo: The patient is from: Home              Anticipated d/c is to: Home              Anticipated d/c date is: 2 days              Patient currently is not medically stable to d/c.   Difficult to place patient No           Subjective:   Interval events noted.  He has no complaints.  Objective:    Vitals:   06/18/20 0400 06/18/20 0431 06/18/20 0800 06/18/20 1200  BP: (!) 143/80 (!) 143/80 (!) 142/78 125/81  Pulse: 81 73 72 65  Resp: (!) '22 20 19 19  '$ Temp:  98.1 F (36.7 C) 97.9 F (36.6 C) 98.4 F (36.9 C)  TempSrc:  Oral Oral Oral  SpO2: 96%  96% 94%  Weight:      Height:       No data found.   Intake/Output Summary (Last 24 hours) at 06/18/2020  1507 Last data filed at 06/18/2020 0901 Gross per 24 hour  Intake 647.82 ml  Output 992 ml  Net -344.18 ml   Filed Weights   06/17/20 1249 06/18/20 0100  Weight: 63.8 kg 59.8 kg    Exam:  GEN: NAD SKIN: No rash EYES: EOMI ENT: MMM CV: RRR PULM: CTA B ABD: soft, ND, NT, +BS CNS: AAO x 3, non focal EXT: No edema or tenderness        Data Reviewed:   I have personally reviewed following labs and imaging studies:  Labs: Labs show the following:   Basic Metabolic Panel: Recent Labs  Lab 06/17/20 1338 06/18/20 1117  NA 143 138  K 5.1 4.1  CL 106 105  CO2 22 23  GLUCOSE 72 235*  BUN 78* 46*  CREATININE 7.78* 5.25*  CALCIUM 8.4* 7.7*   GFR Estimated Creatinine Clearance: 12.7 mL/min (A) (by C-G formula based on SCr of 5.25 mg/dL (H)). Liver Function Tests: Recent Labs  Lab 06/17/20 1338  AST 19  ALT 11  ALKPHOS 188*  BILITOT 0.5  PROT 6.8  ALBUMIN 2.9*   No results for input(s): LIPASE, AMYLASE in the last 168 hours. No results for input(s): AMMONIA in the last 168 hours. Coagulation profile Recent Labs  Lab 06/17/20 1352  INR 1.5*    CBC: Recent Labs  Lab 06/17/20 1338 06/18/20 1117  WBC 4.5 4.7  NEUTROABS 3.1  --   HGB 10.9* 8.6*  HCT 36.6* 27.5*  MCV 93.1 90.2  PLT 93* 81*   Cardiac Enzymes: No results for input(s): CKTOTAL, CKMB, CKMBINDEX, TROPONINI in the last 168 hours. BNP (last 3 results) No results for input(s): PROBNP in the last 8760 hours. CBG: Recent Labs  Lab 06/17/20 2026 06/17/20 2124 06/18/20 0439 06/18/20 0811 06/18/20 1209  GLUCAP 54* 126* 125* 235* 214*   D-Dimer: No results for input(s): DDIMER in the last 72 hours. Hgb A1c: No results for input(s): HGBA1C in the last 72 hours. Lipid Profile: No results for input(s): CHOL, HDL, LDLCALC, TRIG, CHOLHDL, LDLDIRECT in the last 72 hours. Thyroid function studies: No results for input(s): TSH, T4TOTAL, T3FREE, THYROIDAB in the last 72 hours.  Invalid  input(s): FREET3 Anemia work up: No results for input(s): VITAMINB12, FOLATE, FERRITIN, TIBC, IRON, RETICCTPCT in the last 72 hours. Sepsis Labs: Recent Labs  Lab 06/17/20 1338 06/17/20 1352 06/17/20 1650 06/18/20 1117  WBC 4.5  --   --  4.7  LATICACIDVEN  --  1.9 1.6  --     Microbiology Recent Results (from the past 240 hour(s))  SARS CORONAVIRUS 2 (TAT 6-24 HRS) Nasopharyngeal Nasopharyngeal Swab     Status: None   Collection Time: 06/14/20 12:41 PM   Specimen: Nasopharyngeal Swab  Result Value Ref Range Status   SARS Coronavirus 2 NEGATIVE NEGATIVE Final    Comment: (NOTE) SARS-CoV-2 target nucleic acids are NOT DETECTED.  The SARS-CoV-2 RNA is generally detectable in upper and lower respiratory specimens during the acute phase of infection. Negative results do not preclude SARS-CoV-2 infection, do not rule out co-infections with other pathogens, and should not be used as the sole basis for treatment or other patient management decisions. Negative results must be combined with clinical observations, patient history, and epidemiological information. The expected result is Negative.  Fact Sheet for Patients: SugarRoll.be  Fact Sheet for Healthcare Providers: https://www.woods-mathews.com/  This test is not yet approved or cleared by the [place] FDA and  has been authorized for detection and/or diagnosis of SARS-CoV-2 by FDA under an Emergency Use Authorization (EUA). This EUA will remain  in effect (meaning this test can be used) for the duration of the COVID-19 declaration under Se ction 564(b)(1) of the Act, 21 U.S.C. section 360bbb-3(b)(1), unless the authorization is terminated or revoked sooner.  Performed at [facility], [place] [address], [place], [place] [address]   Blood Culture (routine x 2)     Status: None (Preliminary result)   Collection Time: 06/17/20  2:09 PM   Specimen: BLOOD  Result Value Ref  Range Status   Specimen Description BLOOD LEFT ANTECUBITAL  Final   Special Requests   Final    BOTTLES DRAWN AEROBIC AND ANAEROBIC Blood Culture adequate volume   Culture   Final    NO GROWTH < 24 HOURS Performed at [facility], [address], [place], [place] [address]    Report Status PENDING  Incomplete  Blood Culture (routine x 2)     Status: None (Preliminary result)   Collection Time: 06/17/20  2:09 PM   Specimen: BLOOD  Result Value Ref Range Status   Specimen Description BLOOD BLOOD LEFT FOREARM  Final   Special Requests   Final    BOTTLES DRAWN AEROBIC AND ANAEROBIC Blood Culture adequate volume   Culture   Final    NO GROWTH < 24 HOURS Performed at [facility], [address] [place], [place] [address]    Report Status PENDING  Incomplete  SARS Coronavirus 2 by RT PCR (hospital order, performed in [place] hospital lab) Nasopharyngeal Nasopharyngeal Swab     Status: None   Collection Time: 06/17/20  3:15 PM   Specimen: Nasopharyngeal Swab  Result Value Ref Range Status   SARS Coronavirus 2 NEGATIVE NEGATIVE Final    Comment: (NOTE) SARS-CoV-2 target nucleic acids are NOT DETECTED.  The SARS-CoV-2 RNA is generally detectable in upper and lower respiratory specimens during the acute phase of infection. The lowest concentration of SARS-CoV-2 viral copies this assay can detect is 250 copies / mL. A negative result does not preclude SARS-CoV-2 infection and should not be used as the sole basis for treatment or other patient management decisions.  A negative result may occur with improper specimen collection / handling, submission of specimen other than nasopharyngeal swab, presence of viral mutation(s) within the areas targeted by this assay, and inadequate number of viral copies (<  250 copies / mL). A negative result must be combined with clinical observations, patient history, and epidemiological information.  Fact Sheet for Patients:    StrictlyIdeas.no  Fact Sheet for Healthcare Providers: BankingDealers.co.za  This test is not yet approved or  cleared by the [place] FDA and has been authorized for detection and/or diagnosis of SARS-CoV-2 by FDA under an Emergency Use Authorization (EUA).  This EUA will remain in effect (meaning this test can be used) for the duration of the COVID-19 declaration under Section 564(b)(1) of the Act, 21 U.S.C. section 360bbb-3(b)(1), unless the authorization is terminated or revoked sooner.  Performed at [facility], [address] [place], [place] [address]   MRSA PCR Screening     Status: None   Collection Time: 06/18/20  1:37 AM   Specimen: Nasal Mucosa; Nasopharyngeal  Result Value Ref Range Status   MRSA by PCR NEGATIVE NEGATIVE Final    Comment:        The GeneXpert MRSA Assay (FDA approved for NASAL specimens only), is one component of a comprehensive MRSA colonization surveillance program. It is not intended to diagnose MRSA infection nor to guide or monitor treatment for MRSA infections. Performed at [facility], [address], [place], [place] [address]     Procedures and diagnostic studies:  DG Chest [facility] 1 View  Result Date: 06/17/2020 CLINICAL DATA:  Cough. Waiting for dialysis treatment. Unresponsive. EXAM: PORTABLE CHEST 1 VIEW COMPARISON:  December 14 21. FINDINGS: Moderate bilateral layering pleural effusions. Enlargement of the cardiac silhouette. Diffuse interstitial prominence. Bibasilar opacities. No visible pneumothorax. No acute osseous abnormality. IMPRESSION: 1. Cardiomegaly with moderate layering bile pleural effusions and diffuse interstitial prominence, most consistent with pulmonary edema. 2. Overlying bibasilar opacities most likely represent atelectasis. Infection is not excluded. Electronically Signed   By: Margaretha Sheffield MD   On: 06/17/2020 14:48                LOS: 1 day   [facility]   Pager on [email or web address]. If 7PM-7AM, please contact night-coverage at [email or web address]     06/18/2020, 3:07 PM

## 2020-06-18 NOTE — TOC Initial Note (Signed)
Transition of Care [facility]) - Initial/Assessment Note    Patient Details  Name: [name] MRN: [id number] Date of Birth: 27-Nov-1960  Transition of Care [facility]) CM/SW Contact:    Harriet Masson, RN Phone Number:[phone] 06/18/2020, 10:29 AM  Clinical Narrative:                 Spoke with spouse Hortense Ramal) and completed readmission screening. Wife receptive to any recommendations however would prefer pt to make that decision if suggested upon his discharge. States pt has a lot of court hearings and this actually affected his medical needs resulting in the missed days via dialysis and visiting the ED. Pt lives with his spouse at home and has a very supportive family who shares the responsibility for transportation to medical appointments and the expense of hired help that monitoring the pt during the week from M-TH 4 hrs a day. States at times pt is non-adherent with his dietary habits and does not eat the way he should. Pt has scan glucometer for his daily CBGs. Spouse indicated pt will have a wheelchair and bedside commode that she will pick up from the agency soon. No other barriers or obstacle mention.   TOC will follow up once again with pt and spouse on recommendations for discharge planning needs when appropriate.   Expected Discharge Plan: Home/Self Care Barriers to Discharge: Continued Medical Work up   Patient Goals and CMS Choice        Expected Discharge Plan and Services Expected Discharge Plan: Home/Self Care In-house Referral: Clinical Social Work     Living arrangements for the past 2 months: Single Family Home                                      Prior Living Arrangements/Services Living arrangements for the past 2 months: Single Family Home Lives with:: Spouse Patient language and need for interpreter reviewed:: No Do you feel safe going back to the place where you live?: Yes (spoke with spouse due to pt's AMS via admit)      Need for Family  Participation in Patient Care: Yes (Comment) Care giver support system in place?: Yes (comment)      Activities of Daily Living Home Assistive Devices/Equipment: Eyeglasses,Oxygen,Shower chair with back,Walker (specify type) ADL Screening (condition at time of admission) Patient's cognitive ability adequate to safely complete daily activities?: Yes Is the patient deaf or have difficulty hearing?: No Does the patient have difficulty seeing, even when wearing glasses/contacts?: No Does the patient have difficulty concentrating, remembering, or making decisions?: Yes Patient able to express need for assistance with ADLs?: Yes Does the patient have difficulty dressing or bathing?: Yes Independently performs ADLs?: No Does the patient have difficulty walking or climbing stairs?: Yes Weakness of Legs: Both Weakness of Arms/Hands: Both  Permission Sought/Granted   Permission granted to share information with : Yes, Verbal Permission Granted (Spoke with spouse due to pt's dx)              Emotional Assessment Appearance:: Appears older than stated age Attitude/Demeanor/Rapport: Self-Confident (report by spouse)     Alcohol / Substance Use: Not Applicable Psych Involvement: No (comment)  Admission diagnosis:  Hypoglycemia [E16.2] Healthcare-associated pneumonia [J18.9] Generalized weakness [R53.1] Hypothermia, initial encounter [T68.XXXA] Acute metabolic encephalopathy [id number] Sepsis, due to unspecified organism, unspecified whether acute organ dysfunction present [facility]) [A41.9] Patient Active Problem List   Diagnosis Date Noted  .  Hypothermia 06/17/2020  . Chronic systolic CHF (congestive heart failure) ([place]) 06/17/2020  . Aspiration pneumonia ([place]) 04/20/2020  . Acute respiratory failure ([place]) 04/20/2020  . Hypoglycemia due to insulin 04/08/2020  . Lactic acidosis 04/08/2020  . Anemia due to end stage renal disease ([place]) 04/08/2020  . Acute on chronic systolic CHF  (congestive heart failure) ([place]) 01/10/2020  . Severe mitral regurgitation 01/10/2020  . Acute on chronic congestive heart failure ([place])   . Acute on chronic respiratory failure with hypoxemia ([place]) 12/29/2019  . Aspiration pneumonia of both lower lobes due to gastric secretions ([place]) 12/29/2019  . Diabetic foot infection ([place]) 12/07/2019  . Acute pulmonary edema ([place]) 12/07/2019  . Paronychia of second toe of left foot 11/09/2019  . Chronic osteomyelitis of toe of left foot ([place]) 11/09/2019  . DM (diabetes mellitus), type 2 with complications ([place]) [id number]  . Hypoglycemia associated with diabetes ([place]) 08/24/2019  . Acute respiratory failure with hypoxia ([place])   . Acute metabolic encephalopathy [id number]  . Hypertensive emergency 07/06/2019  . Acute encephalopathy 07/06/2019  . Seizures due to metabolic disorder ([place]) [id number]  . Hyperosmolar hyperglycemic state (HHS) ([place]) 06/05/2019  . Symptomatic anemia 06/04/2019  . Frequent falls 06/04/2019  . History of CVA (cerebrovascular accident) 06/04/2019  . HCAP (healthcare-associated pneumonia) 06/04/2019  . Acute blood loss anemia 06/04/2019  . Hyperglycemia 05/28/2019  . Fall at home, initial encounter 05/28/2019  . Rib fracture 05/28/2019  . Depression 05/28/2019  . Hypokalemia 05/28/2019  . Weakness 05/27/2019  . ESRD on dialysis ([place])   . Acute renal failure (ARF) ([place]) 04/09/2019  . Polyp of ascending colon   . Diarrhea   . AKI (acute kidney injury) ([place]) 03/04/2019  . Acute kidney injury ([place]) 07/26/2018  . ARF (acute renal failure) ([place]) 07/25/2018  . Bilateral leg numbness 03/19/2018  . Numbness and tingling of both feet 03/19/2018  . CVA (cerebral vascular accident) ([place]) 03/17/2018  . Moderate episode of recurrent major depressive disorder ([place]) 03/11/2018  . UTI (urinary tract infection) 02/27/2018  . Hypoglycemia 01/15/2018  . Type 2 diabetes mellitus without complication, with long-term current use of  insulin ([place]) 08/27/2017  . Protein-calorie malnutrition, severe 08/19/2017  . Pancreatitis, acute 08/16/2017  . DKA (diabetic ketoacidoses) 08/16/2017  . HTN (hypertension) 08/16/2017  . HLD (hyperlipidemia) 08/16/2017  . Carotid stenosis 08/02/2016  . GERD (gastroesophageal reflux disease) 08/02/2016  . History of esophagogastroduodenoscopy (EGD) 07/01/2016  . History of recent blood transfusion 07/01/2016  . Acute renal failure superimposed on stage 4 chronic kidney disease ([place]) 07/01/2016  . Hyponatremia 07/01/2016  . Uncontrolled diabetes mellitus ([place]) 07/01/2016  . Alcohol abuse 07/01/2016  . Monilial esophagitis ([place]) 07/01/2016  . Tobacco abuse 07/01/2016  . Confusion 06/29/2016  . Iron deficiency anemia 06/29/2016  . Coagulopathy ([place]) 06/29/2016   PCP:  [name], MD Pharmacy:   [facility] [phone] - [address] [place] [address] Phone: [phone] Fax: [phone]     Social Determinants of Health (SDOH) Interventions    Readmission Risk Interventions Readmission Risk Prevention Plan 06/18/2020 04/22/2020 04/08/2020  Transportation Screening Complete Complete Complete  PCP or Specialist Appt within 3-5 Days - - -  [place] or [place] - - -  Palliative Care Screening - - -  Medication Review (RN Care Manager) Complete Complete Complete  PCP or Specialist appointment within 3-5 days of discharge Complete Complete Complete  [place] or Home Care Consult Complete Complete Complete  SW Recovery Care/Counseling Consult Complete - Complete  Palliative Care Screening Not Applicable Not Applicable Not Applicable  Skilled Nursing Facility Not Applicable Patient Refused Not Applicable  Some recent data might be hidden

## 2020-06-19 LAB — CBC WITH DIFFERENTIAL/PLATELET
Abs Immature Granulocytes: 0.02 10*3/uL (ref 0.00–0.07)
Basophils Absolute: 0 10*3/uL (ref 0.0–0.1)
Basophils Relative: 0 %
Eosinophils Absolute: 0.1 10*3/uL (ref 0.0–0.5)
Eosinophils Relative: 1 %
HCT: 29.1 % — ABNORMAL LOW (ref 39.0–52.0)
Hemoglobin: 9 g/dL — ABNORMAL LOW (ref 13.0–17.0)
Immature Granulocytes: 0 %
Lymphocytes Relative: 27 %
Lymphs Abs: 1.4 10*3/uL (ref 0.7–4.0)
MCH: 28 pg (ref 26.0–34.0)
MCHC: 30.9 g/dL (ref 30.0–36.0)
MCV: 90.7 fL (ref 80.0–100.0)
Monocytes Absolute: 0.5 10*3/uL (ref 0.1–1.0)
Monocytes Relative: 9 %
Neutro Abs: 3.2 10*3/uL (ref 1.7–7.7)
Neutrophils Relative %: 63 %
Platelets: 96 10*3/uL — ABNORMAL LOW (ref 150–400)
RBC: 3.21 MIL/uL — ABNORMAL LOW (ref 4.22–5.81)
RDW: 19.6 % — ABNORMAL HIGH (ref 11.5–15.5)
WBC: 5.2 10*3/uL (ref 4.0–10.5)
nRBC: 0 % (ref 0.0–0.2)

## 2020-06-19 LAB — GLUCOSE, CAPILLARY
Glucose-Capillary: 119 mg/dL — ABNORMAL HIGH (ref 70–99)
Glucose-Capillary: 37 mg/dL — CL (ref 70–99)
Glucose-Capillary: 73 mg/dL (ref 70–99)
Glucose-Capillary: 175 mg/dL — ABNORMAL HIGH (ref 70–99)
Glucose-Capillary: 168 mg/dL — ABNORMAL HIGH (ref 70–99)
Glucose-Capillary: 300 mg/dL — ABNORMAL HIGH (ref 70–99)

## 2020-06-19 LAB — HIV ANTIBODY (ROUTINE TESTING W REFLEX): HIV Screen 4th Generation wRfx: NONREACTIVE

## 2020-06-19 MED ORDER — INSULIN GLARGINE 100 UNIT/ML ~~LOC~~ SOLN
2.0000 [IU] | Freq: Every day | SUBCUTANEOUS | Status: DC
  Administered 2020-06-19 – 2020-06-25 (×6): 2 [IU] via SUBCUTANEOUS
  Filled 2020-06-19 (×7): qty 0.02

## 2020-06-19 MED ORDER — AZITHROMYCIN 250 MG PO TABS: 500.0000 mg | ORAL_TABLET | Freq: Every day | ORAL | Status: DC

## 2020-06-19 MED ORDER — EPOETIN ALFA 10000 UNIT/ML IJ SOLN
4000.0000 [IU] | INTRAMUSCULAR | Status: DC
  Administered 2020-06-22 – 2020-06-24 (×2): 4000 [IU] via INTRAVENOUS
  Filled 2020-06-19 (×2): qty 1

## 2020-06-19 NOTE — Progress Notes (Signed)
[facility]  ROUNDING NOTE   Subjective:  Patient appears more awake and alert today, consuming breakfast.   He denies worsening shortness of breath, nausea or vomiting. Appetite is fair, encouraged increased oral intake as he still has hypoglycemic episodes.  Objective:  Vital signs in last 24 hours:  Temp:  [97.3 F (36.3 C)-98.3 F (36.8 C)] 98.1 F (36.7 C) (02/06 1109) Pulse Rate:  [69-84] 69 (02/06 1109) Resp:  [14-22] 14 (02/06 1109) BP: (114-144)/(69-106) 117/69 (02/06 1109) SpO2:  [92 %-97 %] 95 % (02/06 1109) Weight:  [60 kg] 60 kg (02/06 0400)  Weight change: -3.81 kg Filed Weights   06/17/20 1249 06/18/20 0100 06/19/20 0400  Weight: 63.8 kg 59.8 kg 60 kg    Intake/Output: I/O last 3 completed shifts: In: 647.8 [P.O.:60; NG/GT:237; IV Piggyback:350.8] Out: 992 [Other:992]   Intake/Output this shift:  No intake/output data recorded.  Physical Exam: General:  In no acute distress  Head: Normocephalic, atraumatic. Moist oral mucosal membranes  Eyes:  Sclerae and conjunctivae clear  Lungs:   Respirations symmetrical, unlabored, lungs clear  Heart:  S1-S2, no rubs or gallops  Abdomen:  Soft, nontender, nondistended  Extremities:  No peripheral edema.  Neurologic:  Awake, alert, answers simple questions appropriately  Skin: No lesions or rashes  Access: Rt AVG    Basic Metabolic Panel: Recent Labs  Lab 06/17/20 1338 06/18/20 1117  NA 143 138  K 5.1 4.1  CL 106 105  CO2 22 23  GLUCOSE 72 235*  BUN 78* 46*  CREATININE 7.78* 5.25*  CALCIUM 8.4* 7.7*    Liver Function Tests: Recent Labs  Lab 06/17/20 1338  AST 19  ALT 11  ALKPHOS 188*  BILITOT 0.5  PROT 6.8  ALBUMIN 2.9*   No results for input(s): LIPASE, AMYLASE in the last 168 hours. No results for input(s): AMMONIA in the last 168 hours.  CBC: Recent Labs  Lab 06/17/20 1338 06/18/20 1117 06/19/20 0433  WBC 4.5 4.7 5.2  NEUTROABS 3.1  --  3.2  HGB 10.9* 8.6* 9.0*   HCT 36.6* 27.5* 29.1*  MCV 93.1 90.2 90.7  PLT 93* 81* 96*    Cardiac Enzymes: No results for input(s): CKTOTAL, CKMB, CKMBINDEX, TROPONINI in the last 168 hours.  BNP: Invalid input(s): POCBNP  CBG: Recent Labs  Lab 06/18/20 2347 06/19/20 0407 06/19/20 0810 06/19/20 0838 06/19/20 1127  GLUCAP 217* 119* 37* 73 175*    Microbiology: Results for orders placed or performed during the hospital encounter of 06/17/20  Blood Culture (routine x 2)     Status: None (Preliminary result)   Collection Time: 06/17/20  2:09 PM   Specimen: BLOOD  Result Value Ref Range Status   Specimen Description BLOOD LEFT ANTECUBITAL  Final   Special Requests   Final    BOTTLES DRAWN AEROBIC AND ANAEROBIC Blood Culture adequate volume   Culture   Final    NO GROWTH 2 DAYS Performed at [facility], [address], [place], [place] [address]    Report Status PENDING  Incomplete  Blood Culture (routine x 2)     Status: None (Preliminary result)   Collection Time: 06/17/20  2:09 PM   Specimen: BLOOD  Result Value Ref Range Status   Specimen Description BLOOD BLOOD LEFT FOREARM  Final   Special Requests   Final    BOTTLES DRAWN AEROBIC AND ANAEROBIC Blood Culture adequate volume   Culture   Final    NO GROWTH 2 DAYS Performed  at [facility], [address] [place], [place] [address]    Report Status PENDING  Incomplete  SARS Coronavirus 2 by RT PCR (hospital order, performed in [facility] hospital lab) Nasopharyngeal Nasopharyngeal Swab     Status: None   Collection Time: 06/17/20  3:15 PM   Specimen: Nasopharyngeal Swab  Result Value Ref Range Status   SARS Coronavirus 2 NEGATIVE NEGATIVE Final    Comment: (NOTE) SARS-CoV-2 target nucleic acids are NOT DETECTED.  The SARS-CoV-2 RNA is generally detectable in upper and lower respiratory specimens during the acute phase of infection. The lowest concentration of SARS-CoV-2 viral copies this assay can detect is  250 copies / mL. A negative result does not preclude SARS-CoV-2 infection and should not be used as the sole basis for treatment or other patient management decisions.  A negative result may occur with improper specimen collection / handling, submission of specimen other than nasopharyngeal swab, presence of viral mutation(s) within the areas targeted by this assay, and inadequate number of viral copies (<250 copies / mL). A negative result must be combined with clinical observations, patient history, and epidemiological information.  Fact Sheet for Patients:   StrictlyIdeas.no  Fact Sheet for Healthcare Providers: BankingDealers.co.za  This test is not yet approved or  cleared by the [place] FDA and has been authorized for detection and/or diagnosis of SARS-CoV-2 by FDA under an Emergency Use Authorization (EUA).  This EUA will remain in effect (meaning this test can be used) for the duration of the COVID-19 declaration under Section 564(b)(1) of the Act, 21 U.S.C. section 360bbb-3(b)(1), unless the authorization is terminated or revoked sooner.  Performed at [facility], [address] [place], [place] [address]   MRSA PCR Screening     Status: None   Collection Time: 06/18/20  1:37 AM   Specimen: Nasal Mucosa; Nasopharyngeal  Result Value Ref Range Status   MRSA by PCR NEGATIVE NEGATIVE Final    Comment:        The GeneXpert MRSA Assay (FDA approved for NASAL specimens only), is one component of a comprehensive MRSA colonization surveillance program. It is not intended to diagnose MRSA infection nor to guide or monitor treatment for MRSA infections. Performed at [facility], [address] [place], [place] [address]     Coagulation Studies: Recent Labs    06/17/20 1352  LABPROT 17.6*  INR 1.5*    Urinalysis: No results for input(s): COLORURINE, LABSPEC, PHURINE, GLUCOSEU, HGBUR,  BILIRUBINUR, KETONESUR, PROTEINUR, UROBILINOGEN, NITRITE, LEUKOCYTESUR in the last 72 hours.  Invalid input(s): APPERANCEUR    Imaging: No results found.   Medications:    . aspirin EC  81 mg Oral Daily  . carvedilol  6.25 mg Oral BID WC  . Chlorhexidine Gluconate Cloth  6 each Topical Q0600  . epoetin alfa  4,000 Units Intravenous Q T,Th,Sa-HD  . feeding supplement (NEPRO CARB STEADY)  237 mL Oral TID BM  . hydrALAZINE  25 mg Oral BID  . insulin aspart  0-5 Units Subcutaneous QHS  . insulin aspart  0-9 Units Subcutaneous TID WC  . insulin glargine  2 Units Subcutaneous QHS  . lidocaine-prilocaine  1 application Topical UD  . melatonin  5 mg Oral QHS  . mirtazapine  7.5 mg Oral QHS  . multivitamin  1 tablet Oral Daily  . sacubitril-valsartan  1 tablet Oral BID  . sertraline  150 mg Oral Daily   acetaminophen **OR** acetaminophen, ondansetron **OR** ondansetron (ZOFRAN) IV  Assessment/ Plan:  [name] is a 60 y.o.  male with history of end-stage renal disease on hemodialysis Tuesdays Thursdays and Saturdays.  He was sent to the ED from his dialysis center due to altered mental status.  He was hypoglycemic with blood sugar 26 on arrival.  Patient missed his dialysis treatment for over 1 week  Prior to this admission..  # ESRD on Dialysis TTS Patient received urgent dialysis treatment on Friday Volume and electrolyte status acceptable today We will plan for dialysis again tomorrow, with current non-Covid schedule  #Anemia with CKD Lab Results  Component Value Date   HGB 9.0 (L) 06/19/2020   Epogen 4000 units with HD  #Secondary Hyperparathyroidism Lab Results  Component Value Date   PTH 100 (H) 04/20/2020   CALCIUM 7.7 (L) 06/18/2020   CAION 1.00 (L) 12/25/2019   PHOS 3.1 04/21/2020    #Diabetes type 2 with CKD Patient had another hypoglycemic episode with blood glucose down to 37 this morning Encouraged p.o. intake    LOS: 2 Ami Mally 2/6/20222:48  PM

## 2020-06-19 NOTE — Plan of Care (Signed)

## 2020-06-19 NOTE — Progress Notes (Signed)
Progress Note    [name]  [id number] DOB: 07-Nov-1960  DOA: 06/17/2020 PCP: [name], MD      Brief Narrative:    Medical records reviewed and are as summarized below:  [name] is a 60 y.o. male       Assessment/Plan:   Principal Problem:   Acute metabolic encephalopathy Active Problems:   GERD (gastroesophageal reflux disease)   ESRD on dialysis ([place])   Depression   DM (diabetes mellitus), type 2 with complications ([place])   Hypothermia   Chronic systolic CHF (congestive heart failure) ([place])    Body mass index is 18.45 kg/m.    Diet Order            Diet renal/carb modified with fluid restriction Diet-HS Snack? Nothing; Fluid restriction: 1200 mL Fluid; Room service appropriate? Yes; Fluid consistency: Thin  Diet effective now                  Acute metabolic encephalopathy secondary to hypoglycemia: Mental status has improved.  Insulin-dependent brittle diabetes with recurrent hyperglycemia and hypoglycemia:  Hemoglobin A1c is 7.8.  He was taking Lantus 6 units nightly at home.  Decrease Lantus from 3 units nightly to 2 units nightly.  Monitor glucose levels closely.  Hypothermia: Resolved.  This was likely due to hypoglycemia.  No evidence of infection at this time.  IV antibiotics have been discontinued.  Chronic systolic CHF, severe mitral regurgitation: Compensated.  Last known EF on 12/07/2019 was 45 to 50%.  Continue Entresto and carvedilol.  ESRD: Follow-up with nephrologist for hemodialysis  Chronic hypoxic respiratory failure: He uses 2 L/min oxygen at home.  Thrombocytopenia: Platelet count is improving.  Continue to monitor.  History of stroke: Continue aspirin   Other comorbidities include anemia of chronic disease, depression, carotid artery stenosis, hyperlipidemia    Consultants:  Nephrologist  Procedures:  None    Medications:   . aspirin EC  81 mg Oral Daily  . carvedilol  6.25 mg Oral BID  WC  . Chlorhexidine Gluconate Cloth  6 each Topical Q0600  . epoetin alfa  4,000 Units Intravenous Q T,Th,Sa-HD  . feeding supplement (NEPRO CARB STEADY)  237 mL Oral TID BM  . hydrALAZINE  25 mg Oral BID  . insulin aspart  0-5 Units Subcutaneous QHS  . insulin aspart  0-9 Units Subcutaneous TID WC  . insulin glargine  2 Units Subcutaneous QHS  . lidocaine-prilocaine  1 application Topical UD  . melatonin  5 mg Oral QHS  . mirtazapine  7.5 mg Oral QHS  . multivitamin  1 tablet Oral Daily  . sacubitril-valsartan  1 tablet Oral BID  . sertraline  150 mg Oral Daily   Continuous Infusions:    Anti-infectives (From admission, onward)   Start     Dose/Rate Route Frequency Ordered Stop   06/19/20 1500  azithromycin (ZITHROMAX) tablet 500 mg  Status:  Discontinued        500 mg Oral Daily 06/19/20 1225 06/19/20 1311   06/17/20 1400  cefTRIAXone (ROCEPHIN) 2 g in sodium chloride 0.9 % 100 mL IVPB  Status:  Discontinued        2 g 200 mL/hr over 30 Minutes Intravenous Every 24 hours 06/17/20 1352 06/19/20 1311   06/17/20 1400  azithromycin (ZITHROMAX) 500 mg in sodium chloride 0.9 % 250 mL IVPB  Status:  Discontinued        500 mg 250 mL/hr over 60 Minutes Intravenous Every  24 hours 06/17/20 1352 06/19/20 1225             Family Communication/Anticipated D/C date and plan/Code Status   DVT prophylaxis: SCDs Start: 06/17/20 1700     Code Status: Full Code  Family Communication: None Disposition Plan:    Status is: Inpatient  Remains inpatient appropriate because:Inpatient level of care appropriate due to severity of illness   Dispo: The patient is from: Home              Anticipated d/c is to: Home              Anticipated d/c date is: 2 days              Patient currently is not medically stable to d/c.   Difficult to place patient No           Subjective:   Interval events noted. Glucose level dropped to 37 this morning.  Objective:    Vitals:    06/19/20 0200 06/19/20 0400 06/19/20 0800 06/19/20 1109  BP:  (!) 144/80 (!) 142/106 117/69  Pulse:  84 83 69  Resp: 20 (!) 21 (!) 22 14  Temp:  98.3 F (36.8 C) (!) 97.3 F (36.3 C) 98.1 F (36.7 C)  TempSrc:   Oral Oral  SpO2:  93% 97% 95%  Weight:  60 kg    Height:       No data found.   Intake/Output Summary (Last 24 hours) at 06/19/2020 1313 Last data filed at 06/18/2020 1735 Gross per 24 hour  Intake -  Output 0 ml  Net 0 ml   Filed Weights   06/17/20 1249 06/18/20 0100 06/19/20 0400  Weight: 63.8 kg 59.8 kg 60 kg    Exam:  GEN: NAD SKIN: No rash EYES: EOMI ENT: MMM CV: RRR PULM: CTA B ABD: soft, ND, NT, +BS CNS: AAO x 3, non focal EXT: Right foot edema but nontender and nonerythematous.  Left second toe amputee       Data Reviewed:   I have personally reviewed following labs and imaging studies:  Labs: Labs show the following:   Basic Metabolic Panel: Recent Labs  Lab 06/17/20 1338 06/18/20 1117  NA 143 138  K 5.1 4.1  CL 106 105  CO2 22 23  GLUCOSE 72 235*  BUN 78* 46*  CREATININE 7.78* 5.25*  CALCIUM 8.4* 7.7*   GFR Estimated Creatinine Clearance: 12.7 mL/min (A) (by C-G formula based on SCr of 5.25 mg/dL (H)). Liver Function Tests: Recent Labs  Lab 06/17/20 1338  AST 19  ALT 11  ALKPHOS 188*  BILITOT 0.5  PROT 6.8  ALBUMIN 2.9*   No results for input(s): LIPASE, AMYLASE in the last 168 hours. No results for input(s): AMMONIA in the last 168 hours. Coagulation profile Recent Labs  Lab 06/17/20 1352  INR 1.5*    CBC: Recent Labs  Lab 06/17/20 1338 06/18/20 1117 06/19/20 0433  WBC 4.5 4.7 5.2  NEUTROABS 3.1  --  3.2  HGB 10.9* 8.6* 9.0*  HCT 36.6* 27.5* 29.1*  MCV 93.1 90.2 90.7  PLT 93* 81* 96*   Cardiac Enzymes: No results for input(s): CKTOTAL, CKMB, CKMBINDEX, TROPONINI in the last 168 hours. BNP (last 3 results) No results for input(s): PROBNP in the last 8760 hours. CBG: Recent Labs  Lab 06/18/20 2347  06/19/20 0407 06/19/20 0810 06/19/20 0838 06/19/20 1127  GLUCAP 217* 119* 37* 73 175*   D-Dimer: No results for input(s): DDIMER  in the last 72 hours. Hgb A1c: Recent Labs    06/18/20 1117  HGBA1C 7.8*   Lipid Profile: No results for input(s): CHOL, HDL, LDLCALC, TRIG, CHOLHDL, LDLDIRECT in the last 72 hours. Thyroid function studies: No results for input(s): TSH, T4TOTAL, T3FREE, THYROIDAB in the last 72 hours.  Invalid input(s): FREET3 Anemia work up: No results for input(s): VITAMINB12, FOLATE, FERRITIN, TIBC, IRON, RETICCTPCT in the last 72 hours. Sepsis Labs: Recent Labs  Lab 06/17/20 1338 06/17/20 1352 06/17/20 1650 06/18/20 1117 06/19/20 0433  WBC 4.5  --   --  4.7 5.2  LATICACIDVEN  --  1.9 1.6  --   --     Microbiology Recent Results (from the past 240 hour(s))  SARS CORONAVIRUS 2 (TAT 6-24 HRS) Nasopharyngeal Nasopharyngeal Swab     Status: None   Collection Time: 06/14/20 12:41 PM   Specimen: Nasopharyngeal Swab  Result Value Ref Range Status   SARS Coronavirus 2 NEGATIVE NEGATIVE Final    Comment: (NOTE) SARS-CoV-2 target nucleic acids are NOT DETECTED.  The SARS-CoV-2 RNA is generally detectable in upper and lower respiratory specimens during the acute phase of infection. Negative results do not preclude SARS-CoV-2 infection, do not rule out co-infections with other pathogens, and should not be used as the sole basis for treatment or other patient management decisions. Negative results must be combined with clinical observations, patient history, and epidemiological information. The expected result is Negative.  Fact Sheet for Patients: SugarRoll.be  Fact Sheet for Healthcare Providers: https://www.woods-mathews.com/  This test is not yet approved or cleared by the [place] FDA and  has been authorized for detection and/or diagnosis of SARS-CoV-2 by FDA under an Emergency Use Authorization (EUA).  This EUA will remain  in effect (meaning this test can be used) for the duration of the COVID-19 declaration under Se ction 564(b)(1) of the Act, 21 U.S.C. section 360bbb-3(b)(1), unless the authorization is terminated or revoked sooner.  Performed at [facility], [place] [address], [place], [place] [address]   Blood Culture (routine x 2)     Status: None (Preliminary result)   Collection Time: 06/17/20  2:09 PM   Specimen: BLOOD  Result Value Ref Range Status   Specimen Description BLOOD LEFT ANTECUBITAL  Final   Special Requests   Final    BOTTLES DRAWN AEROBIC AND ANAEROBIC Blood Culture adequate volume   Culture   Final    NO GROWTH 2 DAYS Performed at [facility], [address], [place], [place] [address]    Report Status PENDING  Incomplete  Blood Culture (routine x 2)     Status: None (Preliminary result)   Collection Time: 06/17/20  2:09 PM   Specimen: BLOOD  Result Value Ref Range Status   Specimen Description BLOOD BLOOD LEFT FOREARM  Final   Special Requests   Final    BOTTLES DRAWN AEROBIC AND ANAEROBIC Blood Culture adequate volume   Culture   Final    NO GROWTH 2 DAYS Performed at [facility], [address] [place], [place] [address]    Report Status PENDING  Incomplete  SARS Coronavirus 2 by RT PCR (hospital order, performed in [place] hospital lab) Nasopharyngeal Nasopharyngeal Swab     Status: None   Collection Time: 06/17/20  3:15 PM   Specimen: Nasopharyngeal Swab  Result Value Ref Range Status   SARS Coronavirus 2 NEGATIVE NEGATIVE Final    Comment: (NOTE) SARS-CoV-2 target nucleic acids are NOT DETECTED.  The  SARS-CoV-2 RNA is generally detectable in upper and lower respiratory specimens during the acute phase of infection. The lowest concentration of SARS-CoV-2 viral copies this assay can detect is 250 copies / mL. A negative result does not preclude SARS-CoV-2 infection and should not be used as the sole basis for  treatment or other patient management decisions.  A negative result may occur with improper specimen collection / handling, submission of specimen other than nasopharyngeal swab, presence of viral mutation(s) within the areas targeted by this assay, and inadequate number of viral copies (<250 copies / mL). A negative result must be combined with clinical observations, patient history, and epidemiological information.  Fact Sheet for Patients:   StrictlyIdeas.no  Fact Sheet for Healthcare Providers: BankingDealers.co.za  This test is not yet approved or  cleared by the [place] FDA and has been authorized for detection and/or diagnosis of SARS-CoV-2 by FDA under an Emergency Use Authorization (EUA).  This EUA will remain in effect (meaning this test can be used) for the duration of the COVID-19 declaration under Section 564(b)(1) of the Act, 21 U.S.C. section 360bbb-3(b)(1), unless the authorization is terminated or revoked sooner.  Performed at [facility], [place] [place], [place] [address]   MRSA PCR Screening     Status: None   Collection Time: 06/18/20  1:37 AM   Specimen: Nasal Mucosa; Nasopharyngeal  Result Value Ref Range Status   MRSA by PCR NEGATIVE NEGATIVE Final    Comment:        The GeneXpert MRSA Assay (FDA approved for NASAL specimens only), is one component of a comprehensive MRSA colonization surveillance program. It is not intended to diagnose MRSA infection nor to guide or monitor treatment for MRSA infections. Performed at [facility], [address] [place], [place] [address]     Procedures and diagnostic studies:  DG Chest [facility] 1 View  Result Date: 06/17/2020 CLINICAL DATA:  Cough. Waiting for dialysis treatment. Unresponsive. EXAM: PORTABLE CHEST 1 VIEW COMPARISON:  December 14 21. FINDINGS: Moderate bilateral layering pleural effusions. Enlargement of the cardiac  silhouette. Diffuse interstitial prominence. Bibasilar opacities. No visible pneumothorax. No acute osseous abnormality. IMPRESSION: 1. Cardiomegaly with moderate layering bile pleural effusions and diffuse interstitial prominence, most consistent with pulmonary edema. 2. Overlying bibasilar opacities most likely represent atelectasis. Infection is not excluded. Electronically Signed   By: Margaretha Sheffield MD   On: 06/17/2020 14:48               LOS: 2 days   [facility]   Pager on [email or web address]. If 7PM-7AM, please contact night-coverage at [email or web address]     06/19/2020, 1:13 PM

## 2020-06-20 ENCOUNTER — Inpatient Hospital Stay: Payer: Medicare Other

## 2020-06-20 ENCOUNTER — Ambulatory Visit: Payer: Medicare Other | Admitting: Podiatry

## 2020-06-20 LAB — CBC WITH DIFFERENTIAL/PLATELET
Abs Immature Granulocytes: 0.02 10*3/uL (ref 0.00–0.07)
Basophils Absolute: 0 10*3/uL (ref 0.0–0.1)
Basophils Relative: 0 %
Eosinophils Absolute: 0.1 10*3/uL (ref 0.0–0.5)
Eosinophils Relative: 2 %
HCT: 33.2 % — ABNORMAL LOW (ref 39.0–52.0)
Hemoglobin: 10.1 g/dL — ABNORMAL LOW (ref 13.0–17.0)
Immature Granulocytes: 0 %
Lymphocytes Relative: 22 %
Lymphs Abs: 1.1 10*3/uL (ref 0.7–4.0)
MCH: 28.1 pg (ref 26.0–34.0)
MCHC: 30.4 g/dL (ref 30.0–36.0)
MCV: 92.5 fL (ref 80.0–100.0)
Monocytes Absolute: 0.5 10*3/uL (ref 0.1–1.0)
Monocytes Relative: 10 %
Neutro Abs: 3.4 10*3/uL (ref 1.7–7.7)
Neutrophils Relative %: 66 %
Platelets: 100 10*3/uL — ABNORMAL LOW (ref 150–400)
RBC: 3.59 MIL/uL — ABNORMAL LOW (ref 4.22–5.81)
RDW: 19.3 % — ABNORMAL HIGH (ref 11.5–15.5)
WBC: 5.2 10*3/uL (ref 4.0–10.5)
nRBC: 0 % (ref 0.0–0.2)

## 2020-06-20 LAB — GLUCOSE, CAPILLARY
Glucose-Capillary: 74 mg/dL (ref 70–99)
Glucose-Capillary: 110 mg/dL — ABNORMAL HIGH (ref 70–99)
Glucose-Capillary: 94 mg/dL (ref 70–99)
Glucose-Capillary: 448 mg/dL — ABNORMAL HIGH (ref 70–99)

## 2020-06-20 LAB — GLUCOSE, RANDOM: Glucose, Bld: 425 mg/dL — ABNORMAL HIGH (ref 70–99)

## 2020-06-20 MED ORDER — INSULIN REGULAR HUMAN 100 UNIT/ML IJ SOLN
8.0000 [IU] | Freq: Once | INTRAMUSCULAR | Status: DC
  Filled 2020-06-20: qty 3

## 2020-06-20 MED ORDER — INSULIN ASPART 100 UNIT/ML ~~LOC~~ SOLN
8.0000 [IU] | Freq: Once | SUBCUTANEOUS | Status: AC
  Administered 2020-06-21: 8 [IU] via INTRAVENOUS
  Filled 2020-06-20 (×2): qty 0.08

## 2020-06-20 NOTE — Care Management Important Message (Signed)
Important Message  Patient Details  Name: [name] MRN: [id number] Date of Birth: 12-28-1960   Medicare Important Message Given:  Yes     Dannette Barbara 06/20/2020, 11:12 AM

## 2020-06-20 NOTE — Progress Notes (Signed)
OT Cancellation Note  Patient Details Name: [name] MRN: [id number] DOB: 12-18-1960   Cancelled Treatment:    Reason Eval/Treat Not Completed: Patient at procedure or test/ unavailable. Consult received, chart reviewed. Pt out of room, noted to be in dialysis. Will re-attempt OT evaluation at later date/time as pt is available and medically appropriate.   Jeni Salles, MPH, MS, OTR/L ascom [phone] 06/20/20, 12:37 PM

## 2020-06-20 NOTE — Evaluation (Addendum)
Physical Therapy Evaluation Patient Details Name: [name] MRN: [id number] DOB: 1960/09/21 Today's Date: 06/20/2020   History of Present Illness  Pt is a 60 y/o M admitted on 06/17/20 with c/c of AMS. Pt was brought to the ED by EMS from dialysis center for evaluation of change in mental status. Pt lethargic & found to have blood sugar of 26. PMH: ESRD on HD (T/Th/S), IDDM, HTN, CVA, GERD, CHF, EtOH abuse, Hyperlipidemia  Clinical Impression  Pt seen for PT evaluation with pt requiring extensive assistance for bed mobility. Pt with poor sitting balance EOB, unable to scoot forward to place BLE feet on floor without assistance. Pt is able to perform LAQ seated EOB with assistance for balance. Pt noted to have significant atrophy to BLE but reports he has been walking in the home with RW & assistance. Pt reports need to have BM & agreeable to getting to [facility] with MAX encouragement/education but after multiple attempts & assistance with scooting forward to EOB pt too fatigued to attempt sit>stand. Pt assisted back to bed & bed pan placed total assist. Pt left with call bell & instructed to call for nursing staff assistance when finished - nursing aware. Pt would benefit from STR upon d/c to maximize independence with functional mobility & reduce fall risk prior to return home.  Addendum: Pt on 3L/min supplemental oxygen via nasal cannula during session. Lowest SpO2 reading 88% but otherwise >/= 90%.     Follow Up Recommendations SNF    Equipment Recommendations   (TBD in next venue)    Recommendations for Other Services       Precautions / Restrictions Precautions Precautions: Fall Restrictions Weight Bearing Restrictions: No      Mobility  Bed Mobility Overal bed mobility: Needs Assistance Bed Mobility: Supine to Sit;Sit to Supine     Supine to sit: Mod assist;HOB elevated Sit to supine: Mod assist;Max assist;HOB elevated   General bed mobility comments: assistance to upright  trunk    Transfers                    Ambulation/Gait                Stairs            Wheelchair Mobility    Modified Rankin (Stroke Patients Only)       Balance Overall balance assessment: Needs assistance Sitting-balance support: Bilateral upper extremity supported;No upper extremity supported Sitting balance-Leahy Scale: Poor   Postural control: Posterior lean                                   Pertinent Vitals/Pain Pain Assessment: No/denies pain    Home Living Family/patient expects to be discharged to:: Private residence Living Arrangements: Spouse/significant other Available Help at Discharge: Family;Available PRN/intermittently Type of Home: House Home Access: Level entry     Home Layout: One level Home Equipment: Grab bars - tub/shower;Grab bars - toilet;Cane - single point;Walker - 2 wheels      Prior Function Level of Independence: Needs assistance         Comments: Pt reports he ambulates within the home with a RW with assistance from wife.     Hand Dominance        Extremity/Trunk Assessment   Upper Extremity Assessment Upper Extremity Assessment: Generalized weakness    Lower Extremity Assessment Lower Extremity Assessment: Generalized weakness (significant atrophy BLE distal  to knees, R foot edema compared to L foot)    Cervical / Trunk Assessment Cervical / Trunk Assessment:  (posterior pelvic tilt)  Communication      Cognition Arousal/Alertness: Awake/alert Behavior During Therapy: Flat affect Overall Cognitive Status: Within Functional Limits for tasks assessed                                        General Comments      Exercises     Assessment/Plan    PT Assessment Patient needs continued PT services  PT Problem List Decreased strength;Decreased balance;Decreased mobility;Decreased knowledge of use of DME;Cardiopulmonary status limiting activity;Decreased activity  tolerance;Decreased safety awareness       PT Treatment Interventions DME instruction;Functional mobility training;Patient/family education;Balance training;Modalities;Gait training;Therapeutic activities;Neuromuscular re-education;Stair training;Therapeutic exercise;Manual techniques;Cognitive remediation    PT Goals (Current goals can be found in the Care Plan section)  Acute Rehab PT Goals Patient Stated Goal: none stated PT Goal Formulation: With patient Time For Goal Achievement: 07/04/20    Frequency Min 2X/week   Barriers to discharge Decreased caregiver support      Co-evaluation               AM-PAC PT "6 Clicks" Mobility  Outcome Measure Help needed turning from your back to your side while in a flat bed without using bedrails?: A Lot Help needed moving from lying on your back to sitting on the side of a flat bed without using bedrails?: A Lot Help needed moving to and from a bed to a chair (including a wheelchair)?: Total Help needed standing up from a chair using your arms (e.g., wheelchair or bedside chair)?: Total Help needed to walk in hospital room?: Total Help needed climbing 3-5 steps with a railing? : Total 6 Click Score: 8    End of Session   Activity Tolerance: Patient limited by fatigue Patient left: in bed;with call bell/phone within reach;with bed alarm set Nurse Communication: Mobility status PT Visit Diagnosis: Muscle weakness (generalized) (M62.81);Difficulty in walking, not elsewhere classified (R26.2)    Time: [id number] PT Time Calculation (min) (ACUTE ONLY): 25 min   Charges:   PT Evaluation $PT Eval Low Complexity: 1 Low PT Treatments $Therapeutic Activity: 8-22 mins        Lavone Nian, PT, DPT 06/20/20, 2:08 PM   Waunita Schooner 06/20/2020, 2:03 PM

## 2020-06-20 NOTE — Progress Notes (Signed)
Progress Note    [name]  [id number] DOB: [birth year]  DOA: 06/17/2020 PCP: [name], MD      Brief Narrative:    Medical records reviewed and are as summarized below:  [name] is a 60 y.o. male       Assessment/Plan:   Principal Problem:   Acute metabolic encephalopathy Active Problems:   GERD (gastroesophageal reflux disease)   ESRD on dialysis ([place])   Depression   DM (diabetes mellitus), type 2 with complications ([place])   Hypothermia   Chronic systolic CHF (congestive heart failure) ([place])    Body mass index is 18.48 kg/m.    Diet Order            Diet renal/carb modified with fluid restriction Diet-HS Snack? Nothing; Fluid restriction: 1200 mL Fluid; Room service appropriate? Yes; Fluid consistency: Thin  Diet effective now                  Acute metabolic encephalopathy secondary to hypoglycemia: Mental status has improved.  Insulin-dependent brittle diabetes with recurrent hyperglycemia and hypoglycemia:  Hemoglobin A1c is 7.8.  He was taking Lantus 6 units nightly at home.  Continue Lantus 2 units nightly.  Monitor glucose levels closely.  Right foot swelling: X-ray done today showed findings suspicious for acute osteomyelitis in the right first toe distal phalanx.  MRI of the right big toe has been ordered for further evaluation.  Podiatrist has been consulted as well.  Hypothermia: Resolved.  This was likely due to hypoglycemia.  IV antibiotics have been discontinued.  Chronic systolic CHF, severe mitral regurgitation: Compensated.  Last known EF on 12/07/2019 was 45 to 50%.  Continue Entresto and carvedilol.  ESRD: Follow-up with nephrologist for hemodialysis  Chronic hypoxic respiratory failure: He uses 2 L/min oxygen at home.  Thrombocytopenia: Platelet count is improving.  Continue to monitor.  History of stroke: Continue aspirin   Other comorbidities include anemia of chronic disease, depression, carotid artery  stenosis, hyperlipidemia    Consultants:  Nephrologist  Podiatrist  Procedures:  None    Medications:   . aspirin EC  81 mg Oral Daily  . carvedilol  6.25 mg Oral BID WC  . Chlorhexidine Gluconate Cloth  6 each Topical Q0600  . epoetin alfa  4,000 Units Intravenous Q M,W,F-HD  . feeding supplement (NEPRO CARB STEADY)  237 mL Oral TID BM  . hydrALAZINE  25 mg Oral BID  . insulin aspart  0-5 Units Subcutaneous QHS  . insulin aspart  0-9 Units Subcutaneous TID WC  . insulin glargine  2 Units Subcutaneous QHS  . lidocaine-prilocaine  1 application Topical UD  . melatonin  5 mg Oral QHS  . mirtazapine  7.5 mg Oral QHS  . multivitamin  1 tablet Oral Daily  . sacubitril-valsartan  1 tablet Oral BID  . sertraline  150 mg Oral Daily   Continuous Infusions:    Anti-infectives (From admission, onward)   Start     Dose/Rate Route Frequency Ordered Stop   06/19/20 1500  azithromycin (ZITHROMAX) tablet 500 mg  Status:  Discontinued        500 mg Oral Daily 06/19/20 1225 06/19/20 1311   06/17/20 1400  cefTRIAXone (ROCEPHIN) 2 g in sodium chloride 0.9 % 100 mL IVPB  Status:  Discontinued        2 g 200 mL/hr over 30 Minutes Intravenous Every 24 hours 06/17/20 1352 06/19/20 1311   06/17/20 1400  azithromycin (ZITHROMAX) 500  mg in sodium chloride 0.9 % 250 mL IVPB  Status:  Discontinued        500 mg 250 mL/hr over 60 Minutes Intravenous Every 24 hours 06/17/20 1352 06/19/20 1225             Family Communication/Anticipated D/C date and plan/Code Status   DVT prophylaxis: SCDs Start: 06/17/20 1700     Code Status: Full Code  Family Communication: None Disposition Plan:    Status is: Inpatient  Remains inpatient appropriate because:Inpatient level of care appropriate due to severity of illness   Dispo: The patient is from: Home              Anticipated d/c is to: Home              Anticipated d/c date is: 2 days              Patient currently is not medically  stable to d/c.   Difficult to place patient No           Subjective:   Interval events noted.  He has no complaints.  Objective:    Vitals:   06/20/20 0200 06/20/20 0400 06/20/20 0748 06/20/20 0841  BP:  136/81 (!) 142/83 (!) 149/87  Pulse: 79 81 78 78  Resp: '19 20 18 16  '$ Temp:  97.8 F (36.6 C) 97.6 F (36.4 C) 97.6 F (36.4 C)  TempSrc:   Oral Oral  SpO2: 91% 92% 94% 96%  Weight:  60.1 kg    Height:       No data found.   Intake/Output Summary (Last 24 hours) at 06/20/2020 1146 Last data filed at 06/20/2020 1020 Gross per 24 hour  Intake 358 ml  Output --  Net 358 ml   Filed Weights   06/18/20 0100 06/19/20 0400 06/20/20 0400  Weight: 59.8 kg 60 kg 60.1 kg    Exam:  GEN: NAD SKIN: Warm and dry EYES: No pallor or icterus ENT: MMM CV: RRR PULM: CTA B ABD: soft, ND, NT, +BS CNS: AAO x 3, non focal EXT: Right foot swelling but no tenderness or erythema.  Left second toe amputee.        Data Reviewed:   I have personally reviewed following labs and imaging studies:  Labs: Labs show the following:   Basic Metabolic Panel: Recent Labs  Lab 06/17/20 1338 06/18/20 1117  NA 143 138  K 5.1 4.1  CL 106 105  CO2 22 23  GLUCOSE 72 235*  BUN 78* 46*  CREATININE 7.78* 5.25*  CALCIUM 8.4* 7.7*   GFR Estimated Creatinine Clearance: 12.7 mL/min (A) (by C-G formula based on SCr of 5.25 mg/dL (H)). Liver Function Tests: Recent Labs  Lab 06/17/20 1338  AST 19  ALT 11  ALKPHOS 188*  BILITOT 0.5  PROT 6.8  ALBUMIN 2.9*   No results for input(s): LIPASE, AMYLASE in the last 168 hours. No results for input(s): AMMONIA in the last 168 hours. Coagulation profile Recent Labs  Lab 06/17/20 1352  INR 1.5*    CBC: Recent Labs  Lab 06/17/20 1338 06/18/20 1117 06/19/20 0433 06/20/20 0427  WBC 4.5 4.7 5.2 5.2  NEUTROABS 3.1  --  3.2 3.4  HGB 10.9* 8.6* 9.0* 10.1*  HCT 36.6* 27.5* 29.1* 33.2*  MCV 93.1 90.2 90.7 92.5  PLT 93* 81* 96*  100*   Cardiac Enzymes: No results for input(s): CKTOTAL, CKMB, CKMBINDEX, TROPONINI in the last 168 hours. BNP (last 3 results) No results  for input(s): PROBNP in the last 8760 hours. CBG: Recent Labs  Lab 06/19/20 1127 06/19/20 1541 06/19/20 2051 06/20/20 0341 06/20/20 0843  GLUCAP 175* 168* 300* 74 110*   D-Dimer: No results for input(s): DDIMER in the last 72 hours. Hgb A1c: Recent Labs    06/18/20 1117  HGBA1C 7.8*   Lipid Profile: No results for input(s): CHOL, HDL, LDLCALC, TRIG, CHOLHDL, LDLDIRECT in the last 72 hours. Thyroid function studies: No results for input(s): TSH, T4TOTAL, T3FREE, THYROIDAB in the last 72 hours.  Invalid input(s): FREET3 Anemia work up: No results for input(s): VITAMINB12, FOLATE, FERRITIN, TIBC, IRON, RETICCTPCT in the last 72 hours. Sepsis Labs: Recent Labs  Lab 06/17/20 1338 06/17/20 1352 06/17/20 1650 06/18/20 1117 06/19/20 0433 06/20/20 0427  WBC 4.5  --   --  4.7 5.2 5.2  LATICACIDVEN  --  1.9 1.6  --   --   --     Microbiology Recent Results (from the past 240 hour(s))  SARS CORONAVIRUS 2 (TAT 6-24 HRS) Nasopharyngeal Nasopharyngeal Swab     Status: None   Collection Time: 06/14/20 12:41 PM   Specimen: Nasopharyngeal Swab  Result Value Ref Range Status   SARS Coronavirus 2 NEGATIVE NEGATIVE Final    Comment: (NOTE) SARS-CoV-2 target nucleic acids are NOT DETECTED.  The SARS-CoV-2 RNA is generally detectable in upper and lower respiratory specimens during the acute phase of infection. Negative results do not preclude SARS-CoV-2 infection, do not rule out co-infections with other pathogens, and should not be used as the sole basis for treatment or other patient management decisions. Negative results must be combined with clinical observations, patient history, and epidemiological information. The expected result is Negative.  Fact Sheet for Patients: SugarRoll.be  Fact Sheet for  Healthcare Providers: https://www.woods-mathews.com/  This test is not yet approved or cleared by the [place] FDA and  has been authorized for detection and/or diagnosis of SARS-CoV-2 by FDA under an Emergency Use Authorization (EUA). This EUA will remain  in effect (meaning this test can be used) for the duration of the COVID-19 declaration under Se ction 564(b)(1) of the Act, 21 U.S.C. section 360bbb-3(b)(1), unless the authorization is terminated or revoked sooner.  Performed at [facility], [place] [address], [place], [place] [address]   Blood Culture (routine x 2)     Status: None (Preliminary result)   Collection Time: 06/17/20  2:09 PM   Specimen: BLOOD  Result Value Ref Range Status   Specimen Description BLOOD LEFT ANTECUBITAL  Final   Special Requests   Final    BOTTLES DRAWN AEROBIC AND ANAEROBIC Blood Culture adequate volume   Culture   Final    NO GROWTH 3 DAYS Performed at [facility], [address] [place], [place] [address]    Report Status PENDING  Incomplete  Blood Culture (routine x 2)     Status: None (Preliminary result)   Collection Time: 06/17/20  2:09 PM   Specimen: BLOOD  Result Value Ref Range Status   Specimen Description BLOOD BLOOD LEFT FOREARM  Final   Special Requests   Final    BOTTLES DRAWN AEROBIC AND ANAEROBIC Blood Culture adequate volume   Culture   Final    NO GROWTH 3 DAYS Performed at [facility], [address], [place], [place] [address]    Report Status PENDING  Incomplete  SARS Coronavirus 2 by RT PCR (hospital order, performed in [facility] hospital lab) Nasopharyngeal Nasopharyngeal Swab  Status: None   Collection Time: 06/17/20  3:15 PM   Specimen: Nasopharyngeal Swab  Result Value Ref Range Status   SARS Coronavirus 2 NEGATIVE NEGATIVE Final    Comment: (NOTE) SARS-CoV-2 target nucleic acids are NOT DETECTED.  The SARS-CoV-2 RNA is generally detectable in upper and  lower respiratory specimens during the acute phase of infection. The lowest concentration of SARS-CoV-2 viral copies this assay can detect is 250 copies / mL. A negative result does not preclude SARS-CoV-2 infection and should not be used as the sole basis for treatment or other patient management decisions.  A negative result may occur with improper specimen collection / handling, submission of specimen other than nasopharyngeal swab, presence of viral mutation(s) within the areas targeted by this assay, and inadequate number of viral copies (<250 copies / mL). A negative result must be combined with clinical observations, patient history, and epidemiological information.  Fact Sheet for Patients:   StrictlyIdeas.no  Fact Sheet for Healthcare Providers: BankingDealers.co.za  This test is not yet approved or  cleared by the [place] FDA and has been authorized for detection and/or diagnosis of SARS-CoV-2 by FDA under an Emergency Use Authorization (EUA).  This EUA will remain in effect (meaning this test can be used) for the duration of the COVID-19 declaration under Section 564(b)(1) of the Act, 21 U.S.C. section 360bbb-3(b)(1), unless the authorization is terminated or revoked sooner.  Performed at [facility], [address] [place], [place] [address]   MRSA PCR Screening     Status: None   Collection Time: 06/18/20  1:37 AM   Specimen: Nasal Mucosa; Nasopharyngeal  Result Value Ref Range Status   MRSA by PCR NEGATIVE NEGATIVE Final    Comment:        The GeneXpert MRSA Assay (FDA approved for NASAL specimens only), is one component of a comprehensive MRSA colonization surveillance program. It is not intended to diagnose MRSA infection nor to guide or monitor treatment for MRSA infections. Performed at [facility], [address] [place], [place] [address]     Procedures and diagnostic  studies:  DG Foot 2 Views Right  Result Date: 06/20/2020 CLINICAL DATA:  Soft tissue swelling EXAM: RIGHT FOOT - 2 VIEW COMPARISON:  December 07, 2019 FINDINGS: Frontal and lateral views were obtained. In comparison with the prior study, there is localized loss of bone with erosion along the medial distal most aspect of the first distal phalanx. No similar changes elsewhere. No acute fracture or dislocation. Marked osteoarthritic change in the first MTP joint is again noted, stable. Other joint spaces appear unremarkable. Multiple foci of atherosclerotic vascular calcification noted. IMPRESSION: 1. Loss of bone along the medial aspect of the distal portion of the first distal phalanx. This appearance raises concern for prior osteomyelitis in this area. It is difficult to exclude potential acute osteomyelitis in this area given this appearance. This finding may warrant MR of the foot with particular attention to the first digit to further evaluate. No similar changes elsewhere. 2. Extensive osteoarthritic change in the first MTP joint, stable. 3. No acute fracture or dislocation. 4. Multiple foci of arterial vascular calcification. These results will be called to the ordering clinician or representative by the Radiologist Assistant, and communication documented in the PACS or [employer]. Electronically Signed   By: Lowella Grip III M.D.   On: 06/20/2020 09:17               LOS: 3 days   Kahle Mcqueen  Johnathyn Viscomi  [employer], advertising on [email or web address]. If 7PM-7AM, please contact night-coverage at [email or web address]     06/20/2020, 11:46 AM

## 2020-06-20 NOTE — Progress Notes (Signed)
[facility]  ROUNDING NOTE   Subjective:  Pt seen and evaluated at bedside.  Ate some of his breakfast this AM.  Due for HD today.   Objective:  Vital signs in last 24 hours:  Temp:  [97.5 F (36.4 C)-97.8 F (36.6 C)] 97.8 F (36.6 C) (02/07 1130) Pulse Rate:  [47-81] 47 (02/07 1415) Resp:  [12-25] 18 (02/07 1415) BP: (133-149)/(70-97) 133/82 (02/07 1415) SpO2:  [91 %-98 %] 98 % (02/07 1130) Weight:  [60.1 kg] 60.1 kg (02/07 0400)  Weight change: 0.089 kg Filed Weights   06/18/20 0100 06/19/20 0400 06/20/20 0400  Weight: 59.8 kg 60 kg 60.1 kg    Intake/Output: I/O last 3 completed shifts: In: 240 [P.O.:240] Out: -    Intake/Output this shift:  Total I/O In: 118 [P.O.:118] Out: -   Physical Exam: General:  In no acute distress  Head:  Normocephalic, atraumatic. Moist oral mucosal membranes  Eyes:  Sclerae and conjunctivae clear  Lungs:   Respirations symmetrical, unlabored, lungs clear  Heart:  S1-S2, no rubs or gallops  Abdomen:   Soft, nontender, nondistended  Extremities:  No peripheral edema.  Neurologic:  Awake, alert, answers simple questions appropriately  Skin:  No lesions or rashes  Access:  Rt AVG    Basic Metabolic Panel: Recent Labs  Lab 06/17/20 1338 06/18/20 1117  NA 143 138  K 5.1 4.1  CL 106 105  CO2 22 23  GLUCOSE 72 235*  BUN 78* 46*  CREATININE 7.78* 5.25*  CALCIUM 8.4* 7.7*    Liver Function Tests: Recent Labs  Lab 06/17/20 1338  AST 19  ALT 11  ALKPHOS 188*  BILITOT 0.5  PROT 6.8  ALBUMIN 2.9*   No results for input(s): LIPASE, AMYLASE in the last 168 hours. No results for input(s): AMMONIA in the last 168 hours.  CBC: Recent Labs  Lab 06/17/20 1338 06/18/20 1117 06/19/20 0433 06/20/20 0427  WBC 4.5 4.7 5.2 5.2  NEUTROABS 3.1  --  3.2 3.4  HGB 10.9* 8.6* 9.0* 10.1*  HCT 36.6* 27.5* 29.1* 33.2*  MCV 93.1 90.2 90.7 92.5  PLT 93* 81* 96* 100*    Cardiac Enzymes: No results for input(s):  CKTOTAL, CKMB, CKMBINDEX, TROPONINI in the last 168 hours.  BNP: Invalid input(s): POCBNP  CBG: Recent Labs  Lab 06/19/20 1127 06/19/20 1541 06/19/20 2051 06/20/20 0341 06/20/20 0843  GLUCAP 175* 168* 300* 74 110*    Microbiology: Results for orders placed or performed during the hospital encounter of 06/17/20  Blood Culture (routine x 2)     Status: None (Preliminary result)   Collection Time: 06/17/20  2:09 PM   Specimen: BLOOD  Result Value Ref Range Status   Specimen Description BLOOD LEFT ANTECUBITAL  Final   Special Requests   Final    BOTTLES DRAWN AEROBIC AND ANAEROBIC Blood Culture adequate volume   Culture   Final    NO GROWTH 3 DAYS Performed at [facility], [address], [place], [place] [address]    Report Status PENDING  Incomplete  Blood Culture (routine x 2)     Status: None (Preliminary result)   Collection Time: 06/17/20  2:09 PM   Specimen: BLOOD  Result Value Ref Range Status   Specimen Description BLOOD BLOOD LEFT FOREARM  Final   Special Requests   Final    BOTTLES DRAWN AEROBIC AND ANAEROBIC Blood Culture adequate volume   Culture   Final    NO GROWTH 3 DAYS Performed  at [facility], [address] [place], [place] [address]    Report Status PENDING  Incomplete  SARS Coronavirus 2 by RT PCR (hospital order, performed in [facility] hospital lab) Nasopharyngeal Nasopharyngeal Swab     Status: None   Collection Time: 06/17/20  3:15 PM   Specimen: Nasopharyngeal Swab  Result Value Ref Range Status   SARS Coronavirus 2 NEGATIVE NEGATIVE Final    Comment: (NOTE) SARS-CoV-2 target nucleic acids are NOT DETECTED.  The SARS-CoV-2 RNA is generally detectable in upper and lower respiratory specimens during the acute phase of infection. The lowest concentration of SARS-CoV-2 viral copies this assay can detect is 250 copies / mL. A negative result does not preclude SARS-CoV-2 infection and should not be used as the sole basis  for treatment or other patient management decisions.  A negative result may occur with improper specimen collection / handling, submission of specimen other than nasopharyngeal swab, presence of viral mutation(s) within the areas targeted by this assay, and inadequate number of viral copies (<250 copies / mL). A negative result must be combined with clinical observations, patient history, and epidemiological information.  Fact Sheet for Patients:   StrictlyIdeas.no  Fact Sheet for Healthcare Providers: BankingDealers.co.za  This test is not yet approved or  cleared by the [place] FDA and has been authorized for detection and/or diagnosis of SARS-CoV-2 by FDA under an Emergency Use Authorization (EUA).  This EUA will remain in effect (meaning this test can be used) for the duration of the COVID-19 declaration under Section 564(b)(1) of the Act, 21 U.S.C. section 360bbb-3(b)(1), unless the authorization is terminated or revoked sooner.  Performed at [facility], [address] [place], [place] [address]   MRSA PCR Screening     Status: None   Collection Time: 06/18/20  1:37 AM   Specimen: Nasal Mucosa; Nasopharyngeal  Result Value Ref Range Status   MRSA by PCR NEGATIVE NEGATIVE Final    Comment:        The GeneXpert MRSA Assay (FDA approved for NASAL specimens only), is one component of a comprehensive MRSA colonization surveillance program. It is not intended to diagnose MRSA infection nor to guide or monitor treatment for MRSA infections. Performed at [facility], [address] [place], [place] [address]     Coagulation Studies: No results for input(s): LABPROT, INR in the last 72 hours.  Urinalysis: No results for input(s): COLORURINE, LABSPEC, PHURINE, GLUCOSEU, HGBUR, BILIRUBINUR, KETONESUR, PROTEINUR, UROBILINOGEN, NITRITE, LEUKOCYTESUR in the last 72 hours.  Invalid input(s):  APPERANCEUR    Imaging: DG Foot 2 Views Right  Result Date: 06/20/2020 CLINICAL DATA:  Soft tissue swelling EXAM: RIGHT FOOT - 2 VIEW COMPARISON:  December 07, 2019 FINDINGS: Frontal and lateral views were obtained. In comparison with the prior study, there is localized loss of bone with erosion along the medial distal most aspect of the first distal phalanx. No similar changes elsewhere. No acute fracture or dislocation. Marked osteoarthritic change in the first MTP joint is again noted, stable. Other joint spaces appear unremarkable. Multiple foci of atherosclerotic vascular calcification noted. IMPRESSION: 1. Loss of bone along the medial aspect of the distal portion of the first distal phalanx. This appearance raises concern for prior osteomyelitis in this area. It is difficult to exclude potential acute osteomyelitis in this area given this appearance. This finding may warrant MR of the foot with particular attention to the first digit to further evaluate. No similar changes elsewhere. 2. Extensive osteoarthritic  change in the first MTP joint, stable. 3. No acute fracture or dislocation. 4. Multiple foci of arterial vascular calcification. These results will be called to the ordering clinician or representative by the Radiologist Assistant, and communication documented in the PACS or [employer]. Electronically Signed   By: Lowella Grip III M.D.   On: 06/20/2020 09:17     Medications:    . aspirin EC  81 mg Oral Daily  . carvedilol  6.25 mg Oral BID WC  . Chlorhexidine Gluconate Cloth  6 each Topical Q0600  . epoetin alfa  4,000 Units Intravenous Q M,W,F-HD  . feeding supplement (NEPRO CARB STEADY)  237 mL Oral TID BM  . hydrALAZINE  25 mg Oral BID  . insulin aspart  0-5 Units Subcutaneous QHS  . insulin aspart  0-9 Units Subcutaneous TID WC  . insulin glargine  2 Units Subcutaneous QHS  . lidocaine-prilocaine  1 application Topical UD  . melatonin  5 mg Oral QHS  . mirtazapine  7.5  mg Oral QHS  . multivitamin  1 tablet Oral Daily  . sacubitril-valsartan  1 tablet Oral BID  . sertraline  150 mg Oral Daily   acetaminophen **OR** acetaminophen, ondansetron **OR** ondansetron (ZOFRAN) IV  Assessment/ Plan:  Mr. Reginald LEONGUERRERO is a 60 y.o.  male with history of end-stage renal disease on hemodialysis Tuesdays Thursdays and Saturdays.  He was sent to the ED from his dialysis center due to altered mental status.  He was hypoglycemic with blood sugar 26 on arrival.  Patient missed his dialysis treatment for over 1 week  Prior to this admission..  # ESRD on Dialysis TTS Plan to perform hemodialysis treatment today.  Non-COVID patients are being dialyzed on MWF schedule.  #Anemia with CKD Lab Results  Component Value Date   HGB 10.1 (L) 06/20/2020  Maintain the patient on Epogen 4000 units with HD  #Secondary Hyperparathyroidism Lab Results  Component Value Date   PTH 100 (H) 04/20/2020   CALCIUM 7.7 (L) 06/18/2020   CAION 1.00 (L) 12/25/2019   PHOS 3.1 04/21/2020    #Diabetes type 2 with CKD This a.m. blood sugar was acceptable.  Does appear to be eating a bit better this AM.    LOS: 3 Brenen Beigel 2/7/20222:33 PM

## 2020-06-21 DIAGNOSIS — T383X5A Adverse effect of insulin and oral hypoglycemic [antidiabetic] drugs, initial encounter: Secondary | ICD-10-CM

## 2020-06-21 DIAGNOSIS — E118 Type 2 diabetes mellitus with unspecified complications: Secondary | ICD-10-CM

## 2020-06-21 DIAGNOSIS — E160 Drug-induced hypoglycemia without coma: Secondary | ICD-10-CM

## 2020-06-21 LAB — GLUCOSE, CAPILLARY
Glucose-Capillary: 79 mg/dL (ref 70–99)
Glucose-Capillary: 223 mg/dL — ABNORMAL HIGH (ref 70–99)
Glucose-Capillary: 243 mg/dL — ABNORMAL HIGH (ref 70–99)
Glucose-Capillary: 203 mg/dL — ABNORMAL HIGH (ref 70–99)

## 2020-06-21 NOTE — Plan of Care (Signed)

## 2020-06-21 NOTE — Progress Notes (Addendum)
Progress Note    [name]  [id number] DOB: [birth year]  DOA: 06/17/2020 PCP: [name], MD      Brief Narrative:    Medical records reviewed and are as summarized below:  [name] is a 60 y.o. male       Assessment/Plan:   Principal Problem:   Acute metabolic encephalopathy Active Problems:   GERD (gastroesophageal reflux disease)   ESRD on dialysis ([place])   Depression   DM (diabetes mellitus), type 2 with complications ([place])   Hypoglycemia due to insulin   Hypothermia   Chronic systolic CHF (congestive heart failure) ([place])    Body mass index is 19.18 kg/m.    Diet Order            Diet renal/carb modified with fluid restriction Diet-HS Snack? Nothing; Fluid restriction: 1200 mL Fluid; Room service appropriate? Yes; Fluid consistency: Thin  Diet effective now                  Acute metabolic encephalopathy secondary to hypoglycemia: Mental status is back to baseline.  Insulin-dependent brittle diabetes with recurrent hyperglycemia and hypoglycemia:  Hemoglobin A1c is 7.8.  He was taking Lantus 6 units nightly at home.  Continue Lantus 2 units nightly.  He will be discharged on Lantus 2 units nightly.  Monitor glucose levels closely.  Right foot swelling: MRI of the right big toe showed findings suggestive of chronic osteomyelitis or inflammatory arthropathy of the distal phalanx with surrounding reactive marrow.  There is no definite acute osteomyelitis but there was some concern for cellulitis of the right foot.  However, clinically cellulitis of the right foot seems unlikely.  Follow-up with podiatrist for further recommendations.   Hypothermia: Resolved.  This was likely due to hypoglycemia.  IV antibiotics have been discontinued.  Chronic systolic CHF, severe mitral regurgitation: Compensated.  Last known EF on 12/07/2019 was 45 to 50%.  Continue Entresto and carvedilol.  ESRD: Follow-up with nephrologist for  hemodialysis  Chronic hypoxic respiratory failure: He uses 2 L/min oxygen at home.  Thrombocytopenia: Platelet count is improving.  Continue to monitor.  History of stroke: Continue aspirin   Other comorbidities include anemia of chronic disease, depression, carotid artery stenosis, hyperlipidemia  PT and OT recommend discharge to SNF.  Plan of care was discussed with his wife.  Patient and his wife were told that because he has brittle diabetes and recurrent hypoglycemia he will have to use very low-dose insulin and accommodate hyperglycemic episodes.   Patient and his wife are agreeable to go to SNF.  Follow-up with social worker to assist with placement.    Consultants:  Nephrologist  Podiatrist  Procedures:  None    Medications:   . aspirin EC  81 mg Oral Daily  . carvedilol  6.25 mg Oral BID WC  . Chlorhexidine Gluconate Cloth  6 each Topical Q0600  . epoetin alfa  4,000 Units Intravenous Q M,W,F-HD  . feeding supplement (NEPRO CARB STEADY)  237 mL Oral TID BM  . hydrALAZINE  25 mg Oral BID  . insulin aspart  0-5 Units Subcutaneous QHS  . insulin aspart  0-9 Units Subcutaneous TID WC  . insulin glargine  2 Units Subcutaneous QHS  . lidocaine-prilocaine  1 application Topical UD  . melatonin  5 mg Oral QHS  . mirtazapine  7.5 mg Oral QHS  . multivitamin  1 tablet Oral Daily  . sacubitril-valsartan  1 tablet Oral BID  . sertraline  150 mg Oral Daily   Continuous Infusions:    Anti-infectives (From admission, onward)   Start     Dose/Rate Route Frequency Ordered Stop   06/19/20 1500  azithromycin (ZITHROMAX) tablet 500 mg  Status:  Discontinued        500 mg Oral Daily 06/19/20 1225 06/19/20 1311   06/17/20 1400  cefTRIAXone (ROCEPHIN) 2 g in sodium chloride 0.9 % 100 mL IVPB  Status:  Discontinued        2 g 200 mL/hr over 30 Minutes Intravenous Every 24 hours 06/17/20 1352 06/19/20 1311   06/17/20 1400  azithromycin (ZITHROMAX) 500 mg in sodium chloride  0.9 % 250 mL IVPB  Status:  Discontinued        500 mg 250 mL/hr over 60 Minutes Intravenous Every 24 hours 06/17/20 1352 06/19/20 1225             Family Communication/Anticipated D/C date and plan/Code Status   DVT prophylaxis: SCDs Start: 06/17/20 1700     Code Status: Full Code  Family Communication: None Disposition Plan:    Status is: Inpatient  Remains inpatient appropriate because:Inpatient level of care appropriate due to severity of illness   Dispo: The patient is from: Home              Anticipated d/c is to: Home              Anticipated d/c date is: 2 days              Patient currently is not medically stable to d/c.   Difficult to place patient No           Subjective:   No complaints.  No pain in the legs or feet.  Shortness of breath or chest pain.  Objective:    Vitals:   06/21/20 0221 06/21/20 0300 06/21/20 0743 06/21/20 0931  BP:  140/79 (!) 154/91   Pulse:  87 74 70  Resp:  '17 19 13  '$ Temp:  97.9 F (36.6 C) (!) 97.3 F (36.3 C)   TempSrc:  Axillary Oral   SpO2:  92% (!) 69% 99%  Weight: 62.4 kg     Height:       No data found.   Intake/Output Summary (Last 24 hours) at 06/21/2020 1054 Last data filed at 06/21/2020 0940 Gross per 24 hour  Intake 480 ml  Output 1500 ml  Net -1020 ml   Filed Weights   06/19/20 0400 06/20/20 0400 06/21/20 0221  Weight: 60 kg 60.1 kg 62.4 kg    Exam:  GEN: NAD SKIN: Warm and dry EYES: No pallor or icterus ENT: MMM CV: RRR PULM: CTA B ABD: soft, ND, NT, +BS CNS: AAO x 3, non focal EXT: Right foot is still swollen but swelling has improved.  No tenderness or erythema.  Left second toe amputee.        Data Reviewed:   I have personally reviewed following labs and imaging studies:  Labs: Labs show the following:   Basic Metabolic Panel: Recent Labs  Lab 06/17/20 1338 06/18/20 1117 06/20/20 2147  NA 143 138  --   K 5.1 4.1  --   CL 106 105  --   CO2 22 23  --    GLUCOSE 72 235* 425*  BUN 78* 46*  --   CREATININE 7.78* 5.25*  --   CALCIUM 8.4* 7.7*  --    GFR Estimated Creatinine Clearance: 13.2 mL/min (A) (by C-G formula  based on SCr of 5.25 mg/dL (H)). Liver Function Tests: Recent Labs  Lab 06/17/20 1338  AST 19  ALT 11  ALKPHOS 188*  BILITOT 0.5  PROT 6.8  ALBUMIN 2.9*   No results for input(s): LIPASE, AMYLASE in the last 168 hours. No results for input(s): AMMONIA in the last 168 hours. Coagulation profile Recent Labs  Lab 06/17/20 1352  INR 1.5*    CBC: Recent Labs  Lab 06/17/20 1338 06/18/20 1117 06/19/20 0433 06/20/20 0427  WBC 4.5 4.7 5.2 5.2  NEUTROABS 3.1  --  3.2 3.4  HGB 10.9* 8.6* 9.0* 10.1*  HCT 36.6* 27.5* 29.1* 33.2*  MCV 93.1 90.2 90.7 92.5  PLT 93* 81* 96* 100*   Cardiac Enzymes: No results for input(s): CKTOTAL, CKMB, CKMBINDEX, TROPONINI in the last 168 hours. BNP (last 3 results) No results for input(s): PROBNP in the last 8760 hours. CBG: Recent Labs  Lab 06/20/20 0341 06/20/20 0843 06/20/20 1616 06/20/20 2124 06/21/20 0739  GLUCAP 74 110* 94 448* 79   D-Dimer: No results for input(s): DDIMER in the last 72 hours. Hgb A1c: Recent Labs    06/18/20 1117  HGBA1C 7.8*   Lipid Profile: No results for input(s): CHOL, HDL, LDLCALC, TRIG, CHOLHDL, LDLDIRECT in the last 72 hours. Thyroid function studies: No results for input(s): TSH, T4TOTAL, T3FREE, THYROIDAB in the last 72 hours.  Invalid input(s): FREET3 Anemia work up: No results for input(s): VITAMINB12, FOLATE, FERRITIN, TIBC, IRON, RETICCTPCT in the last 72 hours. Sepsis Labs: Recent Labs  Lab 06/17/20 1338 06/17/20 1352 06/17/20 1650 06/18/20 1117 06/19/20 0433 06/20/20 0427  WBC 4.5  --   --  4.7 5.2 5.2  LATICACIDVEN  --  1.9 1.6  --   --   --     Microbiology Recent Results (from the past 240 hour(s))  SARS CORONAVIRUS 2 (TAT 6-24 HRS) Nasopharyngeal Nasopharyngeal Swab     Status: None   Collection Time:  06/14/20 12:41 PM   Specimen: Nasopharyngeal Swab  Result Value Ref Range Status   SARS Coronavirus 2 NEGATIVE NEGATIVE Final    Comment: (NOTE) SARS-CoV-2 target nucleic acids are NOT DETECTED.  The SARS-CoV-2 RNA is generally detectable in upper and lower respiratory specimens during the acute phase of infection. Negative results do not preclude SARS-CoV-2 infection, do not rule out co-infections with other pathogens, and should not be used as the sole basis for treatment or other patient management decisions. Negative results must be combined with clinical observations, patient history, and epidemiological information. The expected result is Negative.  Fact Sheet for Patients: SugarRoll.be  Fact Sheet for Healthcare Providers: https://www.woods-mathews.com/  This test is not yet approved or cleared by the [place] FDA and  has been authorized for detection and/or diagnosis of SARS-CoV-2 by FDA under an Emergency Use Authorization (EUA). This EUA will remain  in effect (meaning this test can be used) for the duration of the COVID-19 declaration under Se ction 564(b)(1) of the Act, 21 U.S.C. section 360bbb-3(b)(1), unless the authorization is terminated or revoked sooner.  Performed at [facility], [place] [address], [place], [place] [address]   Blood Culture (routine x 2)     Status: None (Preliminary result)   Collection Time: 06/17/20  2:09 PM   Specimen: BLOOD  Result Value Ref Range Status   Specimen Description BLOOD LEFT ANTECUBITAL  Final   Special Requests   Final    BOTTLES DRAWN AEROBIC AND ANAEROBIC Blood Culture adequate volume   Culture  Final    NO GROWTH 4 DAYS Performed at [facility], [address] [place], [place] [address]    Report Status PENDING  Incomplete  Blood Culture (routine x 2)     Status: None (Preliminary result)   Collection Time: 06/17/20  2:09 PM   Specimen: BLOOD  Result  Value Ref Range Status   Specimen Description BLOOD BLOOD LEFT FOREARM  Final   Special Requests   Final    BOTTLES DRAWN AEROBIC AND ANAEROBIC Blood Culture adequate volume   Culture   Final    NO GROWTH 4 DAYS Performed at [facility], [address] [place], [place] [address]    Report Status PENDING  Incomplete  SARS Coronavirus 2 by RT PCR (hospital order, performed in [facility]) Nasopharyngeal Nasopharyngeal Swab     Status: None   Collection Time: 06/17/20  3:15 PM   Specimen: Nasopharyngeal Swab  Result Value Ref Range Status   SARS Coronavirus 2 NEGATIVE NEGATIVE Final    Comment: (NOTE) SARS-CoV-2 target nucleic acids are NOT DETECTED.  The SARS-CoV-2 RNA is generally detectable in upper and lower respiratory specimens during the acute phase of infection. The lowest concentration of SARS-CoV-2 viral copies this assay can detect is 250 copies / mL. A negative result does not preclude SARS-CoV-2 infection and should not be used as the sole basis for treatment or other patient management decisions.  A negative result may occur with improper specimen collection / handling, submission of specimen other than nasopharyngeal swab, presence of viral mutation(s) within the areas targeted by this assay, and inadequate number of viral copies (<250 copies / mL). A negative result must be combined with clinical observations, patient history, and epidemiological information.  Fact Sheet for Patients:   StrictlyIdeas.no  Fact Sheet for Healthcare Providers: BankingDealers.co.za  This test is not yet approved or  cleared by the [place] FDA and has been authorized for detection and/or diagnosis of SARS-CoV-2 by FDA under an Emergency Use Authorization (EUA).  This EUA will remain in effect (meaning this test can be used) for the duration of the COVID-19 declaration under Section 564(b)(1) of the Act, 21  U.S.C. section 360bbb-3(b)(1), unless the authorization is terminated or revoked sooner.  Performed at [facility], [address] [place], [place] [address]   MRSA PCR Screening     Status: None   Collection Time: 06/18/20  1:37 AM   Specimen: Nasal Mucosa; Nasopharyngeal  Result Value Ref Range Status   MRSA by PCR NEGATIVE NEGATIVE Final    Comment:        The GeneXpert MRSA Assay (FDA approved for NASAL specimens only), is one component of a comprehensive MRSA colonization surveillance program. It is not intended to diagnose MRSA infection nor to guide or monitor treatment for MRSA infections. Performed at [facility], [address] [place], [place] [address]     Procedures and diagnostic studies:  MR TOES RIGHT WO CONTRAST  Result Date: 06/21/2020 CLINICAL DATA:  Diabetic foot infection EXAM: MRI OF THE RIGHT TOES WITHOUT CONTRAST TECHNIQUE: Multiplanar, multisequence MR imaging of the right was performed. No intravenous contrast was administered. COMPARISON:  Radiograph same day FINDINGS: Bones/Joint/Cartilage There is area of erosive type change seen at the medial aspect of the distal phalanx. There is increased T2 hyperintense signal seen within the distal phalanx without T1 hypointensity. There is mildly increased T2 hyperintense signal seen within the midfoot. The remainder of the osseous structures are  normal appearance without acute fracture or pathologic marrow infiltration. Ligaments The Lisfranc ligaments are intact. Muscles and Tendons Mild fatty atrophy seen within the muscles of the forefoot with diffusely increased signal, likely due to chronic denervation atrophy. The tendons appear to be intact. Soft tissues Area of superficial ulceration along the medial aspect of the distal first phalanx. No loculated fluid collections are seen. There is skin thickening and subcutaneous edema seen along the dorsum of the midfoot. IMPRESSION: Findings which  may be suggestive of chronic osteomyelitis or inflammatory arthropathy of the distal first phalanx with surrounding reactive marrow. No definite evidence of acute osteomyelitis. Diffuse dorsal subcutaneous edema which may be due to cellulitis. Electronically Signed   By: Prudencio Pair M.D.   On: 06/21/2020 01:43   DG Foot 2 Views Right  Result Date: 06/20/2020 CLINICAL DATA:  Soft tissue swelling EXAM: RIGHT FOOT - 2 VIEW COMPARISON:  December 07, 2019 FINDINGS: Frontal and lateral views were obtained. In comparison with the prior study, there is localized loss of bone with erosion along the medial distal most aspect of the first distal phalanx. No similar changes elsewhere. No acute fracture or dislocation. Marked osteoarthritic change in the first MTP joint is again noted, stable. Other joint spaces appear unremarkable. Multiple foci of atherosclerotic vascular calcification noted. IMPRESSION: 1. Loss of bone along the medial aspect of the distal portion of the first distal phalanx. This appearance raises concern for prior osteomyelitis in this area. It is difficult to exclude potential acute osteomyelitis in this area given this appearance. This finding may warrant MR of the foot with particular attention to the first digit to further evaluate. No similar changes elsewhere. 2. Extensive osteoarthritic change in the first MTP joint, stable. 3. No acute fracture or dislocation. 4. Multiple foci of arterial vascular calcification. These results will be called to the ordering clinician or representative by the Radiologist Assistant, and communication documented in the PACS or [employer]. Electronically Signed   By: Lowella Grip III M.D.   On: 06/20/2020 09:17               LOS: 4 days   Travas Schexnayder  [facility]   Pager on [email or web address]. If 7PM-7AM, please contact night-coverage at [email or web address]     06/21/2020, 10:54 AM

## 2020-06-21 NOTE — Consult Note (Signed)
ORTHOPAEDIC CONSULTATION  REQUESTING PHYSICIAN: Jennye Boroughs, MD  Chief Complaint: Request to evaluate right great toe  HPI: [name] is a 60 y.o. male who complains of recent swelling right foot.  Xray concerning for osteomyelitis right.  Asked to evaluate.  I s/p left 2nd toe amputation  Past Medical History:  Diagnosis Date  . CHF (congestive heart failure) ([place])   . Chronic kidney disease   . Diabetes mellitus without complication ([place])   . ETOH abuse   . GERD (gastroesophageal reflux disease)   . Hyperlipidemia   . Hypertension   . Stroke [facility])    Past Surgical History:  Procedure Laterality Date  . AMPUTATION TOE Left 12/11/2019   Procedure: AMPUTATION LEFT 2nd TOE;  Surgeon: Sharlotte Alamo, DPM;  Location: [facility] ORS;  Service: Podiatry;  Laterality: Left;  . AV FISTULA PLACEMENT Right 11/25/2019   Procedure: INSERTION OF ARTERIOVENOUS (AV) GORE-TEX GRAFT ARM;  Surgeon: Katha Cabal, MD;  Location: [facility] ORS;  Service: Vascular;  Laterality: Right;  . COLONOSCOPY WITH PROPOFOL N/A 03/06/2019   Procedure: COLONOSCOPY WITH PROPOFOL;  Surgeon: Lucilla Lame, MD;  Location: [facility] ENDOSCOPY;  Service: Endoscopy;  Laterality: N/A;  . DIALYSIS/PERMA CATHETER INSERTION N/A 04/10/2019   Procedure: DIALYSIS/PERMA CATHETER INSERTION;  Surgeon: Serafina Mitchell, MD;  Location: [place] CV LAB;  Service: Cardiovascular;  Laterality: N/A;  . DIALYSIS/PERMA CATHETER REMOVAL N/A 03/07/2020   Procedure: DIALYSIS/PERMA CATHETER REMOVAL;  Surgeon: Algernon Huxley, MD;  Location: [place] CV LAB;  Service: Cardiovascular;  Laterality: N/A;  . ESOPHAGOGASTRODUODENOSCOPY (EGD) WITH PROPOFOL N/A 07/01/2016   Procedure: ESOPHAGOGASTRODUODENOSCOPY (EGD) WITH PROPOFOL;  Surgeon: San Jetty, MD;  Location: [facility] ENDOSCOPY;  Service: General;  Laterality: N/A;  . ESOPHAGOGASTRODUODENOSCOPY (EGD) WITH PROPOFOL N/A 12/25/2019   Procedure: ESOPHAGOGASTRODUODENOSCOPY (EGD) WITH PROPOFOL;   Surgeon: Lin Landsman, MD;  Location: [facility] ENDOSCOPY;  Service: Gastroenterology;  Laterality: N/A;  . LOWER EXTREMITY ANGIOGRAPHY Left 12/10/2019   Procedure: Lower Extremity Angiography;  Surgeon: Algernon Huxley, MD;  Location: [place] CV LAB;  Service: Cardiovascular;  Laterality: Left;  . PERIPHERAL VASCULAR THROMBECTOMY N/A 06/15/2020   Procedure: PERIPHERAL VASCULAR THROMBECTOMY;  Surgeon: Algernon Huxley, MD;  Location: [place] CV LAB;  Service: Cardiovascular;  Laterality: N/A;   Social History   Socioeconomic History  . Marital status: Married    Spouse name: [name]   . Number of children: 0  . Years of education: Not on file  . Highest education level: Not on file  Occupational History  . Not on file  Tobacco Use  . Smoking status: Current Every Day Smoker    Packs/day: 0.25    Years: 5.00    Pack years: 1.25    Types: Cigars  . Smokeless tobacco: Never Used  Vaping Use  . Vaping Use: Never used  Substance and Sexual Activity  . Alcohol use: Yes    Alcohol/week: 16.0 standard drinks    Types: 14 Cans of beer, 2 Shots of liquor per week  . Drug use: No  . Sexual activity: Yes    Partners: Female    Birth control/protection: None  Other Topics Concern  . Not on file  Social History Narrative   Lives with wife, works at [employer] Strain: Not on file  Food Insecurity: Not on file  Transportation Needs: Not on file  Physical Activity: Not on file  Stress: Not on file  Social Connections: Not  on file   Family History  Problem Relation Age of Onset  . CAD Brother   . Dementia Mother   . Renal Disease Father    Allergies  Allergen Reactions  . Ferrous Gluconate Nausea And Vomiting  . Other    Prior to Admission medications   Medication Sig Start Date End Date Taking? Authorizing Provider  aspirin EC 81 MG EC tablet Take 1 tablet (81 mg total) by mouth daily. 06/10/19  Yes Jennye Boroughs, MD   carvedilol (COREG) 6.25 MG tablet Take 6.25 mg by mouth 2 (two) times daily with a meal. He is taking this medication twice daily 10/28/19  Yes [provider]  ENTRESTO 24-26 MG Take 1 tablet by mouth 2 (two) times daily. 07/29/19  Yes [provider]  hydrALAZINE (APRESOLINE) 25 MG tablet Take 1 tablet (25 mg total) by mouth in the morning and at bedtime. 01/13/20  Yes Jennye Boroughs, MD  insulin lispro (HUMALOG) 100 UNIT/ML injection Inject into the skin in the morning and at bedtime. Once with breakfast and once with dinner   Yes [provider]  iron polysaccharides (NIFEREX) 150 MG capsule Take 1 capsule (150 mg total) by mouth daily. 01/05/20 04/08/20 Yes Enzo Bi, MD  LANTUS SOLOSTAR 100 UNIT/ML Solostar Pen Inject 2 Units into the skin daily. Patient taking differently: Inject 6 Units into the skin at bedtime. 04/22/20  Yes Nolberto Hanlon, MD  Melatonin 5 MG CAPS Take 5 mg by mouth at bedtime.  05/23/19  Yes [provider]  mirtazapine (REMERON) 7.5 MG tablet Take 7.5 mg by mouth at bedtime. He is taking this medication at bedtime 02/25/20  Yes [provider]  multivitamin (RENA-VIT) TABS tablet Take 1 tablet by mouth daily.   Yes [provider]  Nutritional Supplements (FEEDING SUPPLEMENT, NEPRO CARB STEADY,) LIQD Take 237 mLs by mouth 3 (three) times daily between meals. 07/08/19  Yes Nicole Kindred A, DO  sertraline (ZOLOFT) 100 MG tablet Take 1.5 tablets (150 mg total) by mouth daily. 04/11/20  Yes Nita Sells, MD  epoetin alfa (EPOGEN) 10000 UNIT/ML injection Inject 0.4 mLs (4,000 Units total) into the vein Every Tuesday,Thursday,and Saturday with dialysis. 01/05/20   Enzo Bi, MD  folic acid (FOLVITE) 1 MG tablet Take 1 mg by mouth daily. Patient not taking: No sig reported 03/12/19   [provider]  lidocaine-prilocaine (EMLA) cream Apply 1 application topically as directed. 02/20/20   [provider]   thiamine (VITAMIN B-1) 100 MG tablet Take 100 mg by mouth daily. Patient not taking: No sig reported    [provider]   MR TOES RIGHT WO CONTRAST  Result Date: 06/21/2020 CLINICAL DATA:  Diabetic foot infection EXAM: MRI OF THE RIGHT TOES WITHOUT CONTRAST TECHNIQUE: Multiplanar, multisequence MR imaging of the right was performed. No intravenous contrast was administered. COMPARISON:  Radiograph same day FINDINGS: Bones/Joint/Cartilage There is area of erosive type change seen at the medial aspect of the distal phalanx. There is increased T2 hyperintense signal seen within the distal phalanx without T1 hypointensity. There is mildly increased T2 hyperintense signal seen within the midfoot. The remainder of the osseous structures are normal appearance without acute fracture or pathologic marrow infiltration. Ligaments The Lisfranc ligaments are intact. Muscles and Tendons Mild fatty atrophy seen within the muscles of the forefoot with diffusely increased signal, likely due to chronic denervation atrophy. The tendons appear to be intact. Soft tissues Area of superficial ulceration along the medial aspect of the distal  first phalanx. No loculated fluid collections are seen. There is skin thickening and subcutaneous edema seen along the dorsum of the midfoot. IMPRESSION: Findings which may be suggestive of chronic osteomyelitis or inflammatory arthropathy of the distal first phalanx with surrounding reactive marrow. No definite evidence of acute osteomyelitis. Diffuse dorsal subcutaneous edema which may be due to cellulitis. Electronically Signed   By: Prudencio Pair M.D.   On: 06/21/2020 01:43   DG Foot 2 Views Right  Result Date: 06/20/2020 CLINICAL DATA:  Soft tissue swelling EXAM: RIGHT FOOT - 2 VIEW COMPARISON:  December 07, 2019 FINDINGS: Frontal and lateral views were obtained. In comparison with the prior study, there is localized loss of bone with erosion along the medial distal most aspect of the  first distal phalanx. No similar changes elsewhere. No acute fracture or dislocation. Marked osteoarthritic change in the first MTP joint is again noted, stable. Other joint spaces appear unremarkable. Multiple foci of atherosclerotic vascular calcification noted. IMPRESSION: 1. Loss of bone along the medial aspect of the distal portion of the first distal phalanx. This appearance raises concern for prior osteomyelitis in this area. It is difficult to exclude potential acute osteomyelitis in this area given this appearance. This finding may warrant MR of the foot with particular attention to the first digit to further evaluate. No similar changes elsewhere. 2. Extensive osteoarthritic change in the first MTP joint, stable. 3. No acute fracture or dislocation. 4. Multiple foci of arterial vascular calcification. These results will be called to the ordering clinician or representative by the Radiologist Assistant, and communication documented in the PACS or [employer]. Electronically Signed   By: Lowella Grip III M.D.   On: 06/20/2020 09:17    Positive ROS: All other systems have been reviewed and were otherwise negative with the exception of those mentioned in the HPI and as above.  12 point ROS was performed.  Physical Exam: General: Alert and oriented.  No apparent distress.  Vascular:  Left foot:Dorsalis Pedis:  thready Posterior Tibial:  thready  Right foot: Dorsalis Pedis:  thready Posterior Tibial:  thready  Neuro:absent protective sensation  Derm:No ulceration to right great toe.  No active drainage.  Ortho/MS: s/p left 2nd toe amputation   No edema to right foot currently.  No pain.  No fluctuance.    I reviewed xrays.  Small erosion distal medial phalanx great toe.    Assessment: Erosion to distal phalanx great toe but no active wound or s/s infection.  Plan: This could be chronic osteo or secondary to other erosive disease (gout, RA etc.) but currently there are no  s/s of infection and no wound.  Would recommend monitoring for now and can f/u outpt if there are any worsening signs or symptoms.       Elesa Hacker, DPM Cell [phone]   06/21/2020 9:57 AM

## 2020-06-21 NOTE — Progress Notes (Signed)
[facility]  ROUNDING NOTE   Subjective:  Patient seen while laying in bed. He is ill-appearing.  Denies shortness of breath Denies nausea with meals  Objective:  Vital signs in last 24 hours:  Temp:  [97.3 F (36.3 C)-98.5 F (36.9 C)] 97.4 F (36.3 C) (02/08 1143) Pulse Rate:  [47-87] 68 (02/08 1143) Resp:  [13-25] 20 (02/08 1143) BP: (125-154)/(72-91) 126/82 (02/08 1143) SpO2:  [69 %-100 %] 100 % (02/08 1143) Weight:  [62.4 kg] 62.4 kg (02/08 0221)  Weight change: 2.27 kg Filed Weights   06/19/20 0400 06/20/20 0400 06/21/20 0221  Weight: 60 kg 60.1 kg 62.4 kg    Intake/Output: I/O last 3 completed shifts: In: 61 [P.O.:598] Out: 1500 [Other:1500]   Intake/Output this shift:  Total I/O In: 240 [P.O.:240] Out: -   Physical Exam: General:  In no acute distress  Head:  Normocephalic, atraumatic. Moist oral mucosal  Eyes:  Sclerae and conjunctivae clear  Lungs:   Respirations symmetrical, unlabored, lungs clear, O2-4L  Heart:  S1-S2 present  Abdomen:   Soft, nontender, nondistended  Extremities:  No peripheral edema.  Neurologic:  Awake, alert, answers simple questions appropriately  Skin:  No lesions or rashes  Access:  Rt AVG    Basic Metabolic Panel: Recent Labs  Lab 06/17/20 1338 06/18/20 1117 06/20/20 2147  NA 143 138  --   K 5.1 4.1  --   CL 106 105  --   CO2 22 23  --   GLUCOSE 72 235* 425*  BUN 78* 46*  --   CREATININE 7.78* 5.25*  --   CALCIUM 8.4* 7.7*  --     Liver Function Tests: Recent Labs  Lab 06/17/20 1338  AST 19  ALT 11  ALKPHOS 188*  BILITOT 0.5  PROT 6.8  ALBUMIN 2.9*   No results for input(s): LIPASE, AMYLASE in the last 168 hours. No results for input(s): AMMONIA in the last 168 hours.  CBC: Recent Labs  Lab 06/17/20 1338 06/18/20 1117 06/19/20 0433 06/20/20 0427  WBC 4.5 4.7 5.2 5.2  NEUTROABS 3.1  --  3.2 3.4  HGB 10.9* 8.6* 9.0* 10.1*  HCT 36.6* 27.5* 29.1* 33.2*  MCV 93.1 90.2 90.7 92.5   PLT 93* 81* 96* 100*    Cardiac Enzymes: No results for input(s): CKTOTAL, CKMB, CKMBINDEX, TROPONINI in the last 168 hours.  BNP: Invalid input(s): POCBNP  CBG: Recent Labs  Lab 06/20/20 0843 06/20/20 1616 06/20/20 2124 06/21/20 0739 06/21/20 1135  GLUCAP 110* 94 448* 44 223*    Microbiology: Results for orders placed or performed during the hospital encounter of 06/17/20  Blood Culture (routine x 2)     Status: None (Preliminary result)   Collection Time: 06/17/20  2:09 PM   Specimen: BLOOD  Result Value Ref Range Status   Specimen Description BLOOD LEFT ANTECUBITAL  Final   Special Requests   Final    BOTTLES DRAWN AEROBIC AND ANAEROBIC Blood Culture adequate volume   Culture   Final    NO GROWTH 4 DAYS Performed at [facility], [address] [place], [place] [address]    Report Status PENDING  Incomplete  Blood Culture (routine x 2)     Status: None (Preliminary result)   Collection Time: 06/17/20  2:09 PM   Specimen: BLOOD  Result Value Ref Range Status   Specimen Description BLOOD BLOOD LEFT FOREARM  Final   Special Requests   Final    BOTTLES DRAWN AEROBIC AND  ANAEROBIC Blood Culture adequate volume   Culture   Final    NO GROWTH 4 DAYS Performed at [facility], [address] [place], [place] [address]    Report Status PENDING  Incomplete  SARS Coronavirus 2 by RT PCR (hospital order, performed in [facility] hospital lab) Nasopharyngeal Nasopharyngeal Swab     Status: None   Collection Time: 06/17/20  3:15 PM   Specimen: Nasopharyngeal Swab  Result Value Ref Range Status   SARS Coronavirus 2 NEGATIVE NEGATIVE Final    Comment: (NOTE) SARS-CoV-2 target nucleic acids are NOT DETECTED.  The SARS-CoV-2 RNA is generally detectable in upper and lower respiratory specimens during the acute phase of infection. The lowest concentration of SARS-CoV-2 viral copies this assay can detect is 250 copies / mL. A negative result does not  preclude SARS-CoV-2 infection and should not be used as the sole basis for treatment or other patient management decisions.  A negative result may occur with improper specimen collection / handling, submission of specimen other than nasopharyngeal swab, presence of viral mutation(s) within the areas targeted by this assay, and inadequate number of viral copies (<250 copies / mL). A negative result must be combined with clinical observations, patient history, and epidemiological information.  Fact Sheet for Patients:   StrictlyIdeas.no  Fact Sheet for Healthcare Providers: BankingDealers.co.za  This test is not yet approved or  cleared by the [place] FDA and has been authorized for detection and/or diagnosis of SARS-CoV-2 by FDA under an Emergency Use Authorization (EUA).  This EUA will remain in effect (meaning this test can be used) for the duration of the COVID-19 declaration under Section 564(b)(1) of the Act, 21 U.S.C. section 360bbb-3(b)(1), unless the authorization is terminated or revoked sooner.  Performed at [facility], [address] [place], [place] [address]   MRSA PCR Screening     Status: None   Collection Time: 06/18/20  1:37 AM   Specimen: Nasal Mucosa; Nasopharyngeal  Result Value Ref Range Status   MRSA by PCR NEGATIVE NEGATIVE Final    Comment:        The GeneXpert MRSA Assay (FDA approved for NASAL specimens only), is one component of a comprehensive MRSA colonization surveillance program. It is not intended to diagnose MRSA infection nor to guide or monitor treatment for MRSA infections. Performed at [facility], [address] [place], [place] [address]     Coagulation Studies: No results for input(s): LABPROT, INR in the last 72 hours.  Urinalysis: No results for input(s): COLORURINE, LABSPEC, PHURINE, GLUCOSEU, HGBUR, BILIRUBINUR, KETONESUR, PROTEINUR, UROBILINOGEN,  NITRITE, LEUKOCYTESUR in the last 72 hours.  Invalid input(s): APPERANCEUR    Imaging: MR TOES RIGHT WO CONTRAST  Result Date: 06/21/2020 CLINICAL DATA:  Diabetic foot infection EXAM: MRI OF THE RIGHT TOES WITHOUT CONTRAST TECHNIQUE: Multiplanar, multisequence MR imaging of the right was performed. No intravenous contrast was administered. COMPARISON:  Radiograph same day FINDINGS: Bones/Joint/Cartilage There is area of erosive type change seen at the medial aspect of the distal phalanx. There is increased T2 hyperintense signal seen within the distal phalanx without T1 hypointensity. There is mildly increased T2 hyperintense signal seen within the midfoot. The remainder of the osseous structures are normal appearance without acute fracture or pathologic marrow infiltration. Ligaments The Lisfranc ligaments are intact. Muscles and Tendons Mild fatty atrophy seen within the muscles of the forefoot with diffusely increased signal, likely due to chronic denervation atrophy. The tendons appear to be intact. Soft tissues  Area of superficial ulceration along the medial aspect of the distal first phalanx. No loculated fluid collections are seen. There is skin thickening and subcutaneous edema seen along the dorsum of the midfoot. IMPRESSION: Findings which may be suggestive of chronic osteomyelitis or inflammatory arthropathy of the distal first phalanx with surrounding reactive marrow. No definite evidence of acute osteomyelitis. Diffuse dorsal subcutaneous edema which may be due to cellulitis. Electronically Signed   By: Prudencio Pair M.D.   On: 06/21/2020 01:43   DG Foot 2 Views Right  Result Date: 06/20/2020 CLINICAL DATA:  Soft tissue swelling EXAM: RIGHT FOOT - 2 VIEW COMPARISON:  December 07, 2019 FINDINGS: Frontal and lateral views were obtained. In comparison with the prior study, there is localized loss of bone with erosion along the medial distal most aspect of the first distal phalanx. No similar changes  elsewhere. No acute fracture or dislocation. Marked osteoarthritic change in the first MTP joint is again noted, stable. Other joint spaces appear unremarkable. Multiple foci of atherosclerotic vascular calcification noted. IMPRESSION: 1. Loss of bone along the medial aspect of the distal portion of the first distal phalanx. This appearance raises concern for prior osteomyelitis in this area. It is difficult to exclude potential acute osteomyelitis in this area given this appearance. This finding may warrant MR of the foot with particular attention to the first digit to further evaluate. No similar changes elsewhere. 2. Extensive osteoarthritic change in the first MTP joint, stable. 3. No acute fracture or dislocation. 4. Multiple foci of arterial vascular calcification. These results will be called to the ordering clinician or representative by the Radiologist Assistant, and communication documented in the PACS or [employer]. Electronically Signed   By: Lowella Grip III M.D.   On: 06/20/2020 09:17     Medications:    . aspirin EC  81 mg Oral Daily  . carvedilol  6.25 mg Oral BID WC  . Chlorhexidine Gluconate Cloth  6 each Topical Q0600  . epoetin alfa  4,000 Units Intravenous Q M,W,F-HD  . feeding supplement (NEPRO CARB STEADY)  237 mL Oral TID BM  . hydrALAZINE  25 mg Oral BID  . insulin aspart  0-5 Units Subcutaneous QHS  . insulin aspart  0-9 Units Subcutaneous TID WC  . insulin glargine  2 Units Subcutaneous QHS  . lidocaine-prilocaine  1 application Topical UD  . melatonin  5 mg Oral QHS  . mirtazapine  7.5 mg Oral QHS  . multivitamin  1 tablet Oral Daily  . sacubitril-valsartan  1 tablet Oral BID  . sertraline  150 mg Oral Daily   acetaminophen **OR** acetaminophen, ondansetron **OR** ondansetron (ZOFRAN) IV  Assessment/ Plan:  Mr. AMEIR GAARDER is a 60 y.o.  male with history of end-stage renal disease on hemodialysis Tuesdays Thursdays and Saturdays.  He was sent to the  ED from his dialysis center due to altered mental status.  He was hypoglycemic with blood sugar 26 on arrival.  Patient missed his dialysis treatment for over 1 week  Prior to this admission..  # ESRD on Dialysis TTS Perform dialysis treatment yesterday. Removed 1500 mL. Next dialysis treatment will be scheduled for tomorrow. Non-COVID patients are being dialyzed on MWF schedule.  #Anemia with CKD Lab Results  Component Value Date   HGB 10.1 (L) 06/20/2020  Maintain the patient on Epogen 4000 units with HD  #Secondary Hyperparathyroidism Lab Results  Component Value Date   PTH 100 (H) 04/20/2020   CALCIUM 7.7 (L) 06/18/2020  CAION 1.00 (L) 12/25/2019   PHOS 3.1 04/21/2020    #Diabetes type 2 with CKD Patient appears to be eating meals well.     LOS: [place] 2/8/20221:34 PM

## 2020-06-21 NOTE — Evaluation (Signed)
Occupational Therapy Evaluation Patient Details Name: [name] MRN: [id number] DOB: 09/29/1960 Today's Date: 06/21/2020    History of Present Illness Pt is a 60 y/o M admitted on 06/17/20 with c/c of AMS. Pt was brought to the ED by EMS from dialysis center for evaluation of change in mental status. Pt lethargic & found to have blood sugar of 26. PMH: ESRD on HD (T/Th/S), IDDM, HTN, CVA, GERD, CHF, EtOH abuse, Hyperlipidemia   Clinical Impression   Pt was seen for OT evaluation this date. Prior to hospital admission, pt was receiving assist from his spouse and a caregiver who comes 4 hours a day for ADL. Currently pt demonstrates impairments as described below (See OT problem list) which functionally limit his ability to perform ADL/self-care tasks. Pt currently requires MAX A for LB ADL, +2 MOD-MAX A for STS and SPT ADL transfers to [facility] with RW and MAX cueing and poor balance. Pt would benefit from skilled OT services to address noted impairments and functional limitations (see below for any additional details) in order to maximize safety and independence while minimizing falls risk and caregiver burden. Upon hospital discharge, recommend STR to maximize pt safety and return to PLOF.     Follow Up Recommendations  SNF    Equipment Recommendations  3 in 1 bedside commode    Recommendations for Other Services       Precautions / Restrictions Precautions Precautions: Fall Restrictions Weight Bearing Restrictions: No      Mobility Bed Mobility Overal bed mobility: Needs Assistance Bed Mobility: Supine to Sit;Sit to Supine     Supine to sit: Mod assist;Max assist;HOB elevated Sit to supine: Max assist        Transfers Overall transfer level: Needs assistance Equipment used: Rolling walker (2 wheeled) Transfers: Sit to/from Stand Sit to Stand: Mod assist;Max assist;+2 physical assistance         General transfer comment: +2 from caregiver    Balance Overall balance  assessment: Needs assistance Sitting-balance support: Bilateral upper extremity supported;Feet supported Sitting balance-Leahy Scale: Poor   Postural control: Posterior lean Standing balance support: Bilateral upper extremity supported Standing balance-Leahy Scale: Poor Standing balance comment: significant assist +2 to maintain standing                           ADL either performed or assessed with clinical judgement   ADL Overall ADL's : Needs assistance/impaired                                       General ADL Comments: MAX A +2 toilet transfer, MAX A for LB ADL, MIN-MOD A for UB ADL, CGA to MIN A for static sitting balance for grooming tasks     Vision         Perception     Praxis      Pertinent Vitals/Pain Pain Assessment: No/denies pain     Hand Dominance Left   Extremity/Trunk Assessment Upper Extremity Assessment Upper Extremity Assessment: Generalized weakness   Lower Extremity Assessment Lower Extremity Assessment: Generalized weakness       Communication Communication Communication: Other (comment) (Pt with very soft voice and needing increased time to process)   Cognition Arousal/Alertness: Awake/alert Behavior During Therapy: Flat affect Overall Cognitive Status: Within Functional Limits for tasks assessed  General Comments: follows commands, oriented, alert   General Comments       Exercises Other Exercises Other Exercises: Functional transfer training, bed mobility with caregiver and pt   Shoulder Instructions      Home Living Family/patient expects to be discharged to:: Private residence Living Arrangements: Spouse/significant other Available Help at Discharge: Family;Available PRN/intermittently;Personal care attendant (4hr/day) Type of Home: House Home Access: Level entry     Home Layout: One level     Bathroom Shower/Tub: Radiographer, therapeutic: Standard     Home Equipment: Grab bars - tub/shower;Grab bars - toilet;Cane - single point;Walker - 2 wheels;Tub bench          Prior Functioning/Environment Level of Independence: Needs assistance  Gait / Transfers Assistance Needed: caregiver reports pt ambulating with RW with supervision to Min A from caregiver or spouse, assist for transfers ADL's / Homemaking Assistance Needed: caregiver reports assist for bathing, dressing, pericare adn clothing mgt after toileting, and IADL including meal prep, med mgt, cleaning, and transportation; pt able to self feed and perform light grooming tasks without assist   Comments: 2 falls in 69mo       OT Problem List: Decreased strength;Decreased activity tolerance;Decreased safety awareness;Impaired balance (sitting and/or standing)      OT Treatment/Interventions: Self-care/ADL training;Therapeutic exercise;Therapeutic activities;DME and/or AE instruction;Patient/family education;Balance training    OT Goals(Current goals can be found in the care plan section) Acute Rehab OT Goals Patient Stated Goal: get better OT Goal Formulation: With patient/family Time For Goal Achievement: 07/05/20 Potential to Achieve Goals: Good ADL Goals Pt Will Perform Grooming: sitting;with set-up;with min guard assist Pt Will Transfer to Toilet: bedside commode;stand pivot transfer;with max assist (+1 assist) Additional ADL Goal #1: Pt/caregiver will verbalize at least 1 learned falls prevention strategy to minimize risk of falls.  OT Frequency: Min 1X/week   Barriers to D/C:            Co-evaluation              AM-PAC OT "6 Clicks" Daily Activity     Outcome Measure Help from another person eating meals?: A Little Help from another person taking care of personal grooming?: A Little Help from another person toileting, which includes using toliet, bedpan, or urinal?: Total Help from another person bathing (including washing, rinsing,  drying)?: A Lot Help from another person to put on and taking off regular upper body clothing?: A Lot Help from another person to put on and taking off regular lower body clothing?: A Lot 6 Click Score: 13   End of Session Nurse Communication: Other (comment) (caregiver/pt asking about coffee, graham crackers, insulin, SCDs)  Activity Tolerance: Patient tolerated treatment well Patient left: in bed;with call bell/phone within reach;with bed alarm set;with family/visitor present  OT Visit Diagnosis: Other abnormalities of gait and mobility (R26.89);Muscle weakness (generalized) (M62.81);History of falling (Z91.81)                Time: [id number] Time Calculation (min): 30 min Charges:  OT General Charges $OT Visit: 1 Visit OT Evaluation $OT Eval Moderate Complexity: 1 Mod OT Treatments $Self Care/Home Management : 8-22 mins  JJeni Salles MPH, MS, OTR/L ascom [phone]/08/22, 5:00 PM

## 2020-06-22 LAB — CULTURE, BLOOD (ROUTINE X 2)
Culture: NO GROWTH
Culture: NO GROWTH
Special Requests: ADEQUATE
Special Requests: ADEQUATE

## 2020-06-22 LAB — GLUCOSE, CAPILLARY
Glucose-Capillary: 202 mg/dL — ABNORMAL HIGH (ref 70–99)
Glucose-Capillary: 57 mg/dL — ABNORMAL LOW (ref 70–99)
Glucose-Capillary: 50 mg/dL — ABNORMAL LOW (ref 70–99)
Glucose-Capillary: 156 mg/dL — ABNORMAL HIGH (ref 70–99)
Glucose-Capillary: 60 mg/dL — ABNORMAL LOW (ref 70–99)
Glucose-Capillary: 261 mg/dL — ABNORMAL HIGH (ref 70–99)
Glucose-Capillary: 372 mg/dL — ABNORMAL HIGH (ref 70–99)

## 2020-06-22 MED ORDER — DEXTROSE 50 % IV SOLN
INTRAVENOUS | Status: AC
  Administered 2020-06-22: 50 mL
  Filled 2020-06-22: qty 50

## 2020-06-22 MED ORDER — INSULIN ASPART 100 UNIT/ML ~~LOC~~ SOLN
0.0000 [IU] | Freq: Three times a day (TID) | SUBCUTANEOUS | Status: DC
  Administered 2020-06-22: 3 [IU] via SUBCUTANEOUS
  Administered 2020-06-23: 1 [IU] via SUBCUTANEOUS
  Administered 2020-06-23 (×2): 4 [IU] via SUBCUTANEOUS
  Administered 2020-06-24: 1 [IU] via SUBCUTANEOUS
  Administered 2020-06-24: 4 [IU] via SUBCUTANEOUS
  Administered 2020-06-25: 1 [IU] via SUBCUTANEOUS
  Administered 2020-06-25: 4 [IU] via SUBCUTANEOUS
  Filled 2020-06-22: qty 0.06
  Filled 2020-06-22 (×8): qty 1

## 2020-06-22 NOTE — Progress Notes (Signed)
[facility]  ROUNDING NOTE   Subjective:  Patient seen in dialysis   HEMODIALYSIS FLOWSHEET:  Blood Flow Rate (mL/min): 200 mL/min Arterial Pressure (mmHg): -80 mmHg Venous Pressure (mmHg): 70 mmHg Transmembrane Pressure (mmHg): 30 mmHg Ultrafiltration Rate (mL/min): 70 mL/min Dialysate Flow Rate (mL/min): 600 ml/min Conductivity: Machine : 14 Conductivity: Machine : 14 Dialysis Fluid Bolus: Normal Saline Bolus Amount (mL): 250 mL  Patient laying comfortable in bed.  Seen during an earlier visit in the room with breakfast States he was going to try and eat breakfast at that time.  Denies shortness of breath, chest pain and nausea. Alert but slow to respond  Objective:  Vital signs in last 24 hours:  Temp:  [96.1 F (35.6 C)-98.2 F (36.8 C)] 97.7 F (36.5 C) (02/09 0753) Pulse Rate:  [59-78] 74 (02/09 0753) Resp:  [14-20] 15 (02/09 0753) BP: (126-147)/(75-84) 129/83 (02/09 0753) SpO2:  [89 %-100 %] 92 % (02/09 0753)  Weight change:  Filed Weights   06/19/20 0400 06/20/20 0400 06/21/20 0221  Weight: 60 kg 60.1 kg 62.4 kg    Intake/Output: I/O last 3 completed shifts: In: 32 [P.O.:720] Out: 250 [Urine:250]   Intake/Output this shift:  Total I/O In: 240 [P.O.:240] Out: -   Physical Exam: General:  In no acute distress  Head:  Normocephalic, atraumatic. Moist oral mucosal  Eyes:  Sclerae and conjunctivae clear  Lungs:   Respirations symmetrical, unlabored, lungs clear, O2-4L  Heart:  S1-S2 present  Abdomen:   Soft, nontender, nondistended  Extremities:  No peripheral edema.  Neurologic:  Awake, alert, answers simple questions  Skin:  No lesions or rashes  Access:  Rt AVG, + bruit/thrill    Basic Metabolic Panel: Recent Labs  Lab 06/17/20 1338 06/18/20 1117 06/20/20 2147  NA 143 138  --   K 5.1 4.1  --   CL 106 105  --   CO2 22 23  --   GLUCOSE 72 235* 425*  BUN 78* 46*  --   CREATININE 7.78* 5.25*  --   CALCIUM 8.4* 7.7*  --      Liver Function Tests: Recent Labs  Lab 06/17/20 1338  AST 19  ALT 11  ALKPHOS 188*  BILITOT 0.5  PROT 6.8  ALBUMIN 2.9*   No results for input(s): LIPASE, AMYLASE in the last 168 hours. No results for input(s): AMMONIA in the last 168 hours.  CBC: Recent Labs  Lab 06/17/20 1338 06/18/20 1117 06/19/20 0433 06/20/20 0427  WBC 4.5 4.7 5.2 5.2  NEUTROABS 3.1  --  3.2 3.4  HGB 10.9* 8.6* 9.0* 10.1*  HCT 36.6* 27.5* 29.1* 33.2*  MCV 93.1 90.2 90.7 92.5  PLT 93* 81* 96* 100*    Cardiac Enzymes: No results for input(s): CKTOTAL, CKMB, CKMBINDEX, TROPONINI in the last 168 hours.  BNP: Invalid input(s): POCBNP  CBG: Recent Labs  Lab 06/21/20 2239 06/22/20 0409 06/22/20 0420 06/22/20 0754 06/22/20 0841  GLUCAP 203* 57* 202* 50* 156*    Microbiology: Results for orders placed or performed during the hospital encounter of 06/17/20  Blood Culture (routine x 2)     Status: None   Collection Time: 06/17/20  2:09 PM   Specimen: BLOOD  Result Value Ref Range Status   Specimen Description BLOOD LEFT ANTECUBITAL  Final   Special Requests   Final    BOTTLES DRAWN AEROBIC AND ANAEROBIC Blood Culture adequate volume   Culture   Final    NO GROWTH 5 DAYS Performed at  [facility], [address] [place], [place] [address]    Report Status 06/22/2020 FINAL  Final  Blood Culture (routine x 2)     Status: None   Collection Time: 06/17/20  2:09 PM   Specimen: BLOOD  Result Value Ref Range Status   Specimen Description BLOOD BLOOD LEFT FOREARM  Final   Special Requests   Final    BOTTLES DRAWN AEROBIC AND ANAEROBIC Blood Culture adequate volume   Culture   Final    NO GROWTH 5 DAYS Performed at [facility], [address] [place], [place] [address]    Report Status 06/22/2020 FINAL  Final  SARS Coronavirus 2 by RT PCR (hospital order, performed in [facility] hospital lab) Nasopharyngeal Nasopharyngeal Swab     Status: None   Collection Time:  06/17/20  3:15 PM   Specimen: Nasopharyngeal Swab  Result Value Ref Range Status   SARS Coronavirus 2 NEGATIVE NEGATIVE Final    Comment: (NOTE) SARS-CoV-2 target nucleic acids are NOT DETECTED.  The SARS-CoV-2 RNA is generally detectable in upper and lower respiratory specimens during the acute phase of infection. The lowest concentration of SARS-CoV-2 viral copies this assay can detect is 250 copies / mL. A negative result does not preclude SARS-CoV-2 infection and should not be used as the sole basis for treatment or other patient management decisions.  A negative result may occur with improper specimen collection / handling, submission of specimen other than nasopharyngeal swab, presence of viral mutation(s) within the areas targeted by this assay, and inadequate number of viral copies (<250 copies / mL). A negative result must be combined with clinical observations, patient history, and epidemiological information.  Fact Sheet for Patients:   StrictlyIdeas.no  Fact Sheet for Healthcare Providers: BankingDealers.co.za  This test is not yet approved or  cleared by the [place] FDA and has been authorized for detection and/or diagnosis of SARS-CoV-2 by FDA under an Emergency Use Authorization (EUA).  This EUA will remain in effect (meaning this test can be used) for the duration of the COVID-19 declaration under Section 564(b)(1) of the Act, 21 U.S.C. section 360bbb-3(b)(1), unless the authorization is terminated or revoked sooner.  Performed at [facility], [address] [place], [place] [address]   MRSA PCR Screening     Status: None   Collection Time: 06/18/20  1:37 AM   Specimen: Nasal Mucosa; Nasopharyngeal  Result Value Ref Range Status   MRSA by PCR NEGATIVE NEGATIVE Final    Comment:        The GeneXpert MRSA Assay (FDA approved for NASAL specimens only), is one component of a comprehensive MRSA  colonization surveillance program. It is not intended to diagnose MRSA infection nor to guide or monitor treatment for MRSA infections. Performed at [facility], [address] [place], [place] [address]     Coagulation Studies: No results for input(s): LABPROT, INR in the last 72 hours.  Urinalysis: No results for input(s): COLORURINE, LABSPEC, PHURINE, GLUCOSEU, HGBUR, BILIRUBINUR, KETONESUR, PROTEINUR, UROBILINOGEN, NITRITE, LEUKOCYTESUR in the last 72 hours.  Invalid input(s): APPERANCEUR    Imaging: MR TOES RIGHT WO CONTRAST  Result Date: 06/21/2020 CLINICAL DATA:  Diabetic foot infection EXAM: MRI OF THE RIGHT TOES WITHOUT CONTRAST TECHNIQUE: Multiplanar, multisequence MR imaging of the right was performed. No intravenous contrast was administered. COMPARISON:  Radiograph same day FINDINGS: Bones/Joint/Cartilage There is area of erosive type change seen at the medial aspect of the distal phalanx. There is increased T2  hyperintense signal seen within the distal phalanx without T1 hypointensity. There is mildly increased T2 hyperintense signal seen within the midfoot. The remainder of the osseous structures are normal appearance without acute fracture or pathologic marrow infiltration. Ligaments The Lisfranc ligaments are intact. Muscles and Tendons Mild fatty atrophy seen within the muscles of the forefoot with diffusely increased signal, likely due to chronic denervation atrophy. The tendons appear to be intact. Soft tissues Area of superficial ulceration along the medial aspect of the distal first phalanx. No loculated fluid collections are seen. There is skin thickening and subcutaneous edema seen along the dorsum of the midfoot. IMPRESSION: Findings which may be suggestive of chronic osteomyelitis or inflammatory arthropathy of the distal first phalanx with surrounding reactive marrow. No definite evidence of acute osteomyelitis. Diffuse dorsal subcutaneous edema which may  be due to cellulitis. Electronically Signed   By: Prudencio Pair M.D.   On: 06/21/2020 01:43     Medications:    . aspirin EC  81 mg Oral Daily  . carvedilol  6.25 mg Oral BID WC  . Chlorhexidine Gluconate Cloth  6 each Topical Q0600  . epoetin alfa  4,000 Units Intravenous Q M,W,F-HD  . feeding supplement (NEPRO CARB STEADY)  237 mL Oral TID BM  . hydrALAZINE  25 mg Oral BID  . insulin aspart  0-5 Units Subcutaneous QHS  . insulin aspart  0-9 Units Subcutaneous TID WC  . insulin glargine  2 Units Subcutaneous QHS  . lidocaine-prilocaine  1 application Topical UD  . melatonin  5 mg Oral QHS  . mirtazapine  7.5 mg Oral QHS  . multivitamin  1 tablet Oral Daily  . sacubitril-valsartan  1 tablet Oral BID  . sertraline  150 mg Oral Daily   acetaminophen **OR** acetaminophen, ondansetron **OR** ondansetron (ZOFRAN) IV  Assessment/ Plan:  [name] is a 60 y.o.  male with history of end-stage renal disease on hemodialysis Tuesdays Thursdays and Saturdays.  He was sent to the ED from his dialysis center due to altered mental status.  He was hypoglycemic with blood sugar 26 on arrival.  Patient missed his dialysis treatment for over 1 week  Prior to this admission..  # ESRD on Dialysis TTS Dialysis treatment given today. Next treatment will be scheduled for Friday. Will continue MWF schedule in hospital due to non-Covid schedule.  #Anemia with CKD HGB increased to 10.1 Lab Results  Component Value Date   HGB 10.1 (L) 06/20/2020  EPO ordered with HD  #Secondary Hyperparathyroidism Lab Results  Component Value Date   PTH 100 (H) 04/20/2020   CALCIUM 7.7 (L) 06/18/2020   CAION 1.00 (L) 12/25/2019   PHOS 3.1 04/21/2020  continuing to monitor these labs  #Diabetes type 2 with CKD Hypoglycemic this morning at 50.  Encouraged to eat meals.     LOS: 5 Colon Flattery, NP 2/9/202210:42 AM

## 2020-06-22 NOTE — Progress Notes (Signed)
PT Cancellation Note  Patient Details Name: [name] MRN: [id number] DOB: 30-Jun-1960   Cancelled Treatment:    Reason Eval/Treat Not Completed: Patient at procedure or test/unavailable.  Pt currently off floor at dialysis.  Will re-attempt PT treatment session at a later date/time as medically appropriate.  Leitha Bleak, PT 06/22/20, 12:20 PM

## 2020-06-22 NOTE — Progress Notes (Signed)
Recorded the wrong arm being used in dynamap.

## 2020-06-22 NOTE — TOC Progression Note (Signed)
Transition of Care [facility]) - Progression Note    Patient Details  Name: [name] MRN: [id number] Date of Birth: May 05, 1961  Transition of Care [facility]) CM/SW [place], RN Phone Number: 06/22/2020, 10:38 AM  Clinical Narrative:   RNCM attempted to meet with patient in room however he is in dialysis. RNCM reached out to patient's wife [name] to discuss recommendations related to SNF at discharge. [name] initially reports that she is concerned over him going to SNF due to risks related to Covid however after some discussion she reports that she believes it is in his best interest to go to SNF at discharge. She will not however give approval for a bed search and requests that this CM talk with patient prior to starting search.     Expected Discharge Plan: Home/Self Care Barriers to Discharge: Continued Medical Work up  Expected Discharge Plan and Services Expected Discharge Plan: Home/Self Care In-house Referral: Clinical Social Work     Living arrangements for the past 2 months: Single Family Home                                       Social Determinants of Health (SDOH) Interventions    Readmission Risk Interventions Readmission Risk Prevention Plan 06/18/2020 04/22/2020 04/08/2020  Transportation Screening Complete Complete Complete  PCP or Specialist Appt within 3-5 Days - - -  HRI or [place] - - -  Palliative Care Screening - - -  Medication Review (RN Care Manager) Complete Complete Complete  PCP or Specialist appointment within 3-5 days of discharge Complete Complete Complete  HRI or Home Care Consult Complete Complete Complete  SW Recovery Care/Counseling Consult Complete - Complete  Palliative Care Screening Not Applicable Not Applicable Not [place] Not Applicable Patient Refused Not Applicable  Some recent data might be hidden

## 2020-06-22 NOTE — Progress Notes (Signed)
Inpatient Diabetes Program Recommendations  AACE/ADA: New Consensus Statement on Inpatient Glycemic Control   Target Ranges:  Prepandial:   less than 140 mg/dL      Peak postprandial:   less than 180 mg/dL (1-2 hours)      Critically ill patients:  140 - 180 mg/dL   Results for JARVIS, YOCHUM (MRN [id number]) as of 06/22/2020 10:46  Ref. Range 06/21/2020 07:39 06/21/2020 11:35 06/21/2020 16:58 06/21/2020 22:39 06/22/2020 04:09 06/22/2020 04:20 06/22/2020 07:54 06/22/2020 08:41  Glucose-Capillary Latest Ref Range: 70 - 99 mg/dL 79 223 (H) 243 (H) 203 (H) 57 (L) 202 (H) 50 (L) 156 (H)   Review of Glycemic Control  Diabetes history: DM2 Outpatient Diabetes medications: Lantus 6 units QHS, Humalog in the am and at bedtime Current orders for Inpatient glycemic control: Lantus 2 units QHS, Novolog 0-9 units TID with meals, Novolog 0-5 units QHS  Inpatient Diabetes Program Recommendations:    Insulin: Noted fasting glucose 50 mg/dl this morning. Please consider decreasing Novolog correction to very sensitive scale Novoog 0-6 units TID and discontinue Novolog bedtime correction. If post prandial glucose continues to be consistently over 180 mg/dl, may need to consider order Novolog 2 units TID with meals for meal coverage if patient eats at least 50% of meals.  Thanks, Barnie Alderman, RN, MSN, CDE Diabetes Coordinator Inpatient Diabetes Program [phone] (Team Pager from 8am to 5pm)

## 2020-06-22 NOTE — TOC Progression Note (Signed)
Transition of Care [facility]) - Progression Note    Patient Details  Name: [name] MRN: [id number] Date of Birth: [birth year]  Transition of Care [facility]) CM/SW [place], RN Phone Number: 06/22/2020, 3:21 PM  Clinical Narrative:   RNCM met with patient in room. Patient is agreeable to SNF placement but reports he won't go anywhere unless it is local. He does not have preference other than that.  RNCM verified PASSR, completed FL2 and sent bed request to all local facilities.     Expected Discharge Plan: Home/Self Care Barriers to Discharge: Continued Medical Work up  Expected Discharge Plan and Services Expected Discharge Plan: Home/Self Care In-house Referral: Clinical Social Work     Living arrangements for the past 2 months: Single Family Home                                       Social Determinants of Health (SDOH) Interventions    Readmission Risk Interventions Readmission Risk Prevention Plan 06/18/2020 04/22/2020 04/08/2020  Transportation Screening Complete Complete Complete  PCP or Specialist Appt within 3-5 Days - - -  HRI or [place] - - -  Palliative Care Screening - - -  Medication Review (RN Care Manager) Complete Complete Complete  PCP or Specialist appointment within 3-5 days of discharge Complete Complete Complete  HRI or Home Care Consult Complete Complete Complete  SW Recovery Care/Counseling Consult Complete - Complete  Palliative Care Screening Not Applicable Not Applicable Not [place] Not Applicable Patient Refused Not Applicable  Some recent data might be hidden

## 2020-06-22 NOTE — NC FL2 (Signed)
[place] LEVEL OF CARE SCREENING TOOL     IDENTIFICATION  Patient Name: [name] Birthdate: [birth year] Sex: male Admission Date (Current Location): 06/17/2020  [place] and [place] Number:  Engineering geologist and Address:  [facility], [address], [place], [place] [address]      Provider Number: [id number]  Attending Physician Name and Address:  Jennye Boroughs, MD  Relative Name and Phone Number:  Rufe Rutkowski [phone]    Current Level of Care: Hospital Recommended Level of Care: [place] Prior Approval Number:    Date Approved/Denied:   PASRR Number: [id number]  Discharge Plan: SNF    Current Diagnoses: Patient Active Problem List   Diagnosis Date Noted  . Hypothermia 06/17/2020  . Chronic systolic CHF (congestive heart failure) ([place]) 06/17/2020  . Aspiration pneumonia ([place]) 04/20/2020  . Acute respiratory failure ([place]) 04/20/2020  . Hypoglycemia due to insulin 04/08/2020  . Lactic acidosis 04/08/2020  . Anemia due to end stage renal disease ([place]) 04/08/2020  . Acute on chronic systolic CHF (congestive heart failure) ([place]) 01/10/2020  . Severe mitral regurgitation 01/10/2020  . Acute on chronic congestive heart failure ([place])   . Acute on chronic respiratory failure with hypoxemia ([place]) 12/29/2019  . Aspiration pneumonia of both lower lobes due to gastric secretions ([place]) 12/29/2019  . Diabetic foot infection ([place]) 12/07/2019  . Acute pulmonary edema ([place]) 12/07/2019  . Paronychia of second toe of left foot 11/09/2019  . Chronic osteomyelitis of toe of left foot ([place]) 11/09/2019  . DM (diabetes mellitus), type 2 with complications ([place]) [id number]  . Hypoglycemia associated with diabetes ([place]) 08/24/2019  . Acute respiratory failure with hypoxia ([place])   . Acute metabolic encephalopathy [id number]  . Hypertensive emergency 07/06/2019  . Acute encephalopathy 07/06/2019  . Seizures due to  metabolic disorder ([place]) [id number]  . Hyperosmolar hyperglycemic state (HHS) ([place]) 06/05/2019  . Symptomatic anemia 06/04/2019  . Frequent falls 06/04/2019  . History of CVA (cerebrovascular accident) 06/04/2019  . HCAP (healthcare-associated pneumonia) 06/04/2019  . Acute blood loss anemia 06/04/2019  . Hyperglycemia 05/28/2019  . Fall at home, initial encounter 05/28/2019  . Rib fracture 05/28/2019  . Depression 05/28/2019  . Hypokalemia 05/28/2019  . Weakness 05/27/2019  . ESRD on dialysis ([place])   . Acute renal failure (ARF) ([place]) 04/09/2019  . Polyp of ascending colon   . Diarrhea   . AKI (acute kidney injury) ([place]) 03/04/2019  . Acute kidney injury ([place]) 07/26/2018  . ARF (acute renal failure) ([place]) 07/25/2018  . Bilateral leg numbness 03/19/2018  . Numbness and tingling of both feet 03/19/2018  . CVA (cerebral vascular accident) ([place]) 03/17/2018  . Moderate episode of recurrent major depressive disorder ([place]) 03/11/2018  . UTI (urinary tract infection) 02/27/2018  . Hypoglycemia 01/15/2018  . Type 2 diabetes mellitus without complication, with long-term current use of insulin ([place]) 08/27/2017  . Protein-calorie malnutrition, severe 08/19/2017  . Pancreatitis, acute 08/16/2017  . DKA (diabetic ketoacidoses) 08/16/2017  . HTN (hypertension) 08/16/2017  . HLD (hyperlipidemia) 08/16/2017  . Carotid stenosis 08/02/2016  . GERD (gastroesophageal reflux disease) 08/02/2016  . History of esophagogastroduodenoscopy (EGD) 07/01/2016  . History of recent blood transfusion 07/01/2016  . Acute renal failure superimposed on stage 4 chronic kidney disease ([place]) 07/01/2016  . Hyponatremia 07/01/2016  . Uncontrolled diabetes mellitus ([place]) 07/01/2016  . Alcohol abuse 07/01/2016  . Monilial esophagitis ([place]) 07/01/2016  . Tobacco abuse 07/01/2016  . Confusion 06/29/2016  . Iron deficiency  anemia 06/29/2016  . Coagulopathy ([place]) 06/29/2016    Orientation RESPIRATION BLADDER Height  & Weight     Self,Time,Situation,Place  O2 (3L) External catheter Weight: 62.4 kg Height:  '5\' 11"'$  (180.3 cm)  BEHAVIORAL SYMPTOMS/MOOD NEUROLOGICAL BOWEL NUTRITION STATUS      Continent Diet (Renal/Carb Modified with 1218m fluid restriction)  AMBULATORY STATUS COMMUNICATION OF NEEDS Skin   Extensive Assist Verbally Normal                       Personal Care Assistance Level of Assistance  Bathing,Feeding,Dressing Bathing Assistance: Limited assistance Feeding assistance: Limited assistance Dressing Assistance: Limited assistance     Functional Limitations Info             SPECIAL CARE FACTORS FREQUENCY  PT (By licensed PT),OT (By licensed OT)                    Contractures Contractures Info: Not present    Additional Factors Info  Code Status,Allergies Code Status Info: Full Allergies Info: Ferrous Gluconate           Current Medications (06/22/2020):  This is the current hospital active medication list Current Facility-Administered Medications  Medication Dose Route Frequency Provider Last Rate Last Admin  . acetaminophen (TYLENOL) tablet 650 mg  650 mg Oral Q6H PRN Agbata, Tochukwu, MD   650 mg at 06/21/20 0934   Or  . acetaminophen (TYLENOL) suppository 650 mg  650 mg Rectal Q6H PRN Agbata, Tochukwu, MD      . aspirin EC tablet 81 mg  81 mg Oral Daily Agbata, Tochukwu, MD   81 mg at 06/21/20 0934  . carvedilol (COREG) tablet 6.25 mg  6.25 mg Oral BID WC Agbata, Tochukwu, MD   6.25 mg at 06/21/20 1812  . Chlorhexidine Gluconate Cloth 2 % PADS 6 each  6 each Topical Q0600 SMurlean Iba MD   6 each at 06/22/20 0550  . epoetin alfa (EPOGEN) injection 4,000 Units  4,000 Units Intravenous Q M,W,F-HD AJennye Boroughs MD   4,000 Units at 06/22/20 1133  . feeding supplement (NEPRO CARB STEADY) liquid 237 mL  237 mL Oral TID BM Agbata, Tochukwu, MD   237 mL at 06/20/20 0939  . hydrALAZINE (APRESOLINE) tablet 25 mg  25 mg Oral BID Agbata, Tochukwu, MD   25 mg at  06/22/20 0946  . insulin aspart (novoLOG) injection 0-6 Units  0-6 Units Subcutaneous TID WC AJennye Boroughs MD      . insulin glargine (LANTUS) injection 2 Units  2 Units Subcutaneous QHS AJennye Boroughs MD   2 Units at 06/21/20 2251  . lidocaine-prilocaine (EMLA) cream 1 application  1 application Topical UD Agbata, Tochukwu, MD      . melatonin tablet 5 mg  5 mg Oral QHS Agbata, Tochukwu, MD   5 mg at 06/21/20 2250  . mirtazapine (REMERON) tablet 7.5 mg  7.5 mg Oral QHS Agbata, Tochukwu, MD   7.5 mg at 06/21/20 2250  . multivitamin (RENA-VIT) tablet 1 tablet  1 tablet Oral Daily Agbata, Tochukwu, MD   1 tablet at 06/21/20 0934  . ondansetron (ZOFRAN) tablet 4 mg  4 mg Oral Q6H PRN Agbata, Tochukwu, MD       Or  . ondansetron (ZOFRAN) injection 4 mg  4 mg Intravenous Q6H PRN Agbata, Tochukwu, MD      . sacubitril-valsartan (ENTRESTO) 24-26 mg per tablet  1 tablet Oral BID Agbata, Tochukwu, MD   1 tablet at 06/21/20  2250  . sertraline (ZOLOFT) tablet 150 mg  150 mg Oral Daily Agbata, Tochukwu, MD   150 mg at 06/21/20 [id number]     Discharge Medications: Please see discharge summary for a list of discharge medications.  Relevant Imaging Results:  Relevant Lab Results:   Additional Information SSN:[id number]  Shelbie Ammons, RN

## 2020-06-22 NOTE — Progress Notes (Addendum)
Progress Note    [name]  [id number] DOB: Jan 16, 1961  DOA: 06/17/2020 PCP: [name], MD      Brief Narrative:    Medical records reviewed and are as summarized below:  [name] is a 60 y.o. male       Assessment/Plan:   Principal Problem:   Acute metabolic encephalopathy Active Problems:   GERD (gastroesophageal reflux disease)   ESRD on dialysis ([place])   Depression   DM (diabetes mellitus), type 2 with complications ([place])   Hypoglycemia due to insulin   Hypothermia   Chronic systolic CHF (congestive heart failure) ([place])    Body mass index is 19.18 kg/m.    Diet Order            Diet renal/carb modified with fluid restriction Diet-HS Snack? Nothing; Fluid restriction: 1200 mL Fluid; Room service appropriate? Yes; Fluid consistency: Thin  Diet effective now                  Acute metabolic encephalopathy secondary to hypoglycemia: Mental status is back to baseline.  Insulin-dependent brittle diabetes with recurrent hyperglycemia and hypoglycemia:  Hemoglobin A1c is 7.8.  He was taking Lantus 6 units nightly at home.  Continue Lantus 2 units nightly.  Decrease sliding scale insulin from sensitive scale to very sensitive scale.  Monitor glucose levels closely.  Recommend bedtime snacks to reduce risk of morning hypoglycemia.  Right foot swelling, probable chronic osteomyelitis of right big toe: MRI of the right big toe showed findings suggestive of chronic osteomyelitis or inflammatory arthropathy of the distal phalanx with surrounding reactive marrow.    Hypothermia: Resolved.  This was likely due to hypoglycemia.  IV antibiotics have been discontinued.  Chronic systolic CHF, severe mitral regurgitation: Compensated.  Last known EF on 12/07/2019 was 45 to 50%.  Continue Entresto and carvedilol.  ESRD: Follow-up with nephrologist for hemodialysis  Chronic hypoxic respiratory failure: He uses 2 L/min oxygen at  home.  Thrombocytopenia: Platelet count is improving.  Continue to monitor.  History of stroke: Continue aspirin   Other comorbidities include anemia of chronic disease, depression, carotid artery stenosis, hyperlipidemia  PT and OT recommend discharge to SNF.  Awaiting placement to SNF.  Consultants:  Nephrologist  Podiatrist  Procedures:  None    Medications:   . aspirin EC  81 mg Oral Daily  . carvedilol  6.25 mg Oral BID WC  . Chlorhexidine Gluconate Cloth  6 each Topical Q0600  . epoetin alfa  4,000 Units Intravenous Q M,W,F-HD  . feeding supplement (NEPRO CARB STEADY)  237 mL Oral TID BM  . hydrALAZINE  25 mg Oral BID  . insulin aspart  0-6 Units Subcutaneous TID WC  . insulin glargine  2 Units Subcutaneous QHS  . lidocaine-prilocaine  1 application Topical UD  . melatonin  5 mg Oral QHS  . mirtazapine  7.5 mg Oral QHS  . multivitamin  1 tablet Oral Daily  . sacubitril-valsartan  1 tablet Oral BID  . sertraline  150 mg Oral Daily   Continuous Infusions:    Anti-infectives (From admission, onward)   Start     Dose/Rate Route Frequency Ordered Stop   06/19/20 1500  azithromycin (ZITHROMAX) tablet 500 mg  Status:  Discontinued        500 mg Oral Daily 06/19/20 1225 06/19/20 1311   06/17/20 1400  cefTRIAXone (ROCEPHIN) 2 g in sodium chloride 0.9 % 100 mL IVPB  Status:  Discontinued  2 g 200 mL/hr over 30 Minutes Intravenous Every 24 hours 06/17/20 1352 06/19/20 1311   06/17/20 1400  azithromycin (ZITHROMAX) 500 mg in sodium chloride 0.9 % 250 mL IVPB  Status:  Discontinued        500 mg 250 mL/hr over 60 Minutes Intravenous Every 24 hours 06/17/20 1352 06/19/20 1225             Family Communication/Anticipated D/C date and plan/Code Status   DVT prophylaxis: SCDs Start: 06/17/20 1700     Code Status: Full Code  Family Communication: None Disposition Plan:    Status is: Inpatient  Remains inpatient appropriate because:Inpatient level  of care appropriate due to severity of illness   Dispo: The patient is from: Home              Anticipated d/c is to: Home              Anticipated d/c date is: 2 days              Patient currently is not medically stable to d/c.   Difficult to place patient No           Subjective:   Interval events noted.  He became hypoglycemic this morning with glucose down to 50 and this was associated with decreased mental status.  Objective:    Vitals:   06/21/20 2236 06/22/20 0113 06/22/20 0407 06/22/20 0753  BP: 140/84 129/79 (!) 147/75 129/83  Pulse: 68 74 78 74  Resp: '18 17 18 15  '$ Temp: (!) 96.1 F (35.6 C) (!) 97.4 F (36.3 C)  97.7 F (36.5 C)  TempSrc:      SpO2: (!) 89% 93% 97% 92%  Weight:      Height:       No data found.   Intake/Output Summary (Last 24 hours) at 06/22/2020 1207 Last data filed at 06/22/2020 1001 Gross per 24 hour  Intake 480 ml  Output 250 ml  Net 230 ml   Filed Weights   06/19/20 0400 06/20/20 0400 06/21/20 0221  Weight: 60 kg 60.1 kg 62.4 kg    Exam:  GEN: NAD SKIN: Warm and dry EYES: No pallor or icterus ENT: MMM CV: RRR PULM: CTA B ABD: soft, ND, NT, +BS CNS: AAO x 3, non focal EXT: Right foot swelling has improved.  No tenderness or erythema.         Data Reviewed:   I have personally reviewed following labs and imaging studies:  Labs: Labs show the following:   Basic Metabolic Panel: Recent Labs  Lab 06/17/20 1338 06/18/20 1117 06/20/20 2147  NA 143 138  --   K 5.1 4.1  --   CL 106 105  --   CO2 22 23  --   GLUCOSE 72 235* 425*  BUN 78* 46*  --   CREATININE 7.78* 5.25*  --   CALCIUM 8.4* 7.7*  --    GFR Estimated Creatinine Clearance: 13.2 mL/min (A) (by C-G formula based on SCr of 5.25 mg/dL (H)). Liver Function Tests: Recent Labs  Lab 06/17/20 1338  AST 19  ALT 11  ALKPHOS 188*  BILITOT 0.5  PROT 6.8  ALBUMIN 2.9*   No results for input(s): LIPASE, AMYLASE in the last 168 hours. No  results for input(s): AMMONIA in the last 168 hours. Coagulation profile Recent Labs  Lab 06/17/20 1352  INR 1.5*    CBC: Recent Labs  Lab 06/17/20 1338 06/18/20 1117 06/19/20 0433 06/20/20 0427  WBC 4.5 4.7 5.2 5.2  NEUTROABS 3.1  --  3.2 3.4  HGB 10.9* 8.6* 9.0* 10.1*  HCT 36.6* 27.5* 29.1* 33.2*  MCV 93.1 90.2 90.7 92.5  PLT 93* 81* 96* 100*   Cardiac Enzymes: No results for input(s): CKTOTAL, CKMB, CKMBINDEX, TROPONINI in the last 168 hours. BNP (last 3 results) No results for input(s): PROBNP in the last 8760 hours. CBG: Recent Labs  Lab 06/21/20 2239 06/22/20 0409 06/22/20 0420 06/22/20 0754 06/22/20 0841  GLUCAP 203* 57* 202* 50* 156*   D-Dimer: No results for input(s): DDIMER in the last 72 hours. Hgb A1c: No results for input(s): HGBA1C in the last 72 hours. Lipid Profile: No results for input(s): CHOL, HDL, LDLCALC, TRIG, CHOLHDL, LDLDIRECT in the last 72 hours. Thyroid function studies: No results for input(s): TSH, T4TOTAL, T3FREE, THYROIDAB in the last 72 hours.  Invalid input(s): FREET3 Anemia work up: No results for input(s): VITAMINB12, FOLATE, FERRITIN, TIBC, IRON, RETICCTPCT in the last 72 hours. Sepsis Labs: Recent Labs  Lab 06/17/20 1338 06/17/20 1352 06/17/20 1650 06/18/20 1117 06/19/20 0433 06/20/20 0427  WBC 4.5  --   --  4.7 5.2 5.2  LATICACIDVEN  --  1.9 1.6  --   --   --     Microbiology Recent Results (from the past 240 hour(s))  SARS CORONAVIRUS 2 (TAT 6-24 HRS) Nasopharyngeal Nasopharyngeal Swab     Status: None   Collection Time: 06/14/20 12:41 PM   Specimen: Nasopharyngeal Swab  Result Value Ref Range Status   SARS Coronavirus 2 NEGATIVE NEGATIVE Final    Comment: (NOTE) SARS-CoV-2 target nucleic acids are NOT DETECTED.  The SARS-CoV-2 RNA is generally detectable in upper and lower respiratory specimens during the acute phase of infection. Negative results do not preclude SARS-CoV-2 infection, do not rule  out co-infections with other pathogens, and should not be used as the sole basis for treatment or other patient management decisions. Negative results must be combined with clinical observations, patient history, and epidemiological information. The expected result is Negative.  Fact Sheet for Patients: SugarRoll.be  Fact Sheet for Healthcare Providers: https://www.woods-mathews.com/  This test is not yet approved or cleared by the [place] FDA and  has been authorized for detection and/or diagnosis of SARS-CoV-2 by FDA under an Emergency Use Authorization (EUA). This EUA will remain  in effect (meaning this test can be used) for the duration of the COVID-19 declaration under Se ction 564(b)(1) of the Act, 21 U.S.C. section 360bbb-3(b)(1), unless the authorization is terminated or revoked sooner.  Performed at [facility], [place] [address] [place], [place] [address]   Blood Culture (routine x 2)     Status: None   Collection Time: 06/17/20  2:09 PM   Specimen: BLOOD  Result Value Ref Range Status   Specimen Description BLOOD LEFT ANTECUBITAL  Final   Special Requests   Final    BOTTLES DRAWN AEROBIC AND ANAEROBIC Blood Culture adequate volume   Culture   Final    NO GROWTH 5 DAYS Performed at [facility], [address] [place], [place] [address]    Report Status 06/22/2020 FINAL  Final  Blood Culture (routine x 2)     Status: None   Collection Time: 06/17/20  2:09 PM   Specimen: BLOOD  Result Value Ref Range Status   Specimen Description BLOOD BLOOD LEFT FOREARM  Final   Special Requests   Final    BOTTLES DRAWN AEROBIC AND ANAEROBIC Blood Culture adequate  volume   Culture   Final    NO GROWTH 5 DAYS Performed at [facility], [address] [place], [place] [address]    Report Status 06/22/2020 FINAL  Final  SARS Coronavirus 2 by RT PCR (hospital order, performed in [facility] hospital lab)  Nasopharyngeal Nasopharyngeal Swab     Status: None   Collection Time: 06/17/20  3:15 PM   Specimen: Nasopharyngeal Swab  Result Value Ref Range Status   SARS Coronavirus 2 NEGATIVE NEGATIVE Final    Comment: (NOTE) SARS-CoV-2 target nucleic acids are NOT DETECTED.  The SARS-CoV-2 RNA is generally detectable in upper and lower respiratory specimens during the acute phase of infection. The lowest concentration of SARS-CoV-2 viral copies this assay can detect is 250 copies / mL. A negative result does not preclude SARS-CoV-2 infection and should not be used as the sole basis for treatment or other patient management decisions.  A negative result may occur with improper specimen collection / handling, submission of specimen other than nasopharyngeal swab, presence of viral mutation(s) within the areas targeted by this assay, and inadequate number of viral copies (<250 copies / mL). A negative result must be combined with clinical observations, patient history, and epidemiological information.  Fact Sheet for Patients:   StrictlyIdeas.no  Fact Sheet for Healthcare Providers: BankingDealers.co.za  This test is not yet approved or  cleared by the [place] FDA and has been authorized for detection and/or diagnosis of SARS-CoV-2 by FDA under an Emergency Use Authorization (EUA).  This EUA will remain in effect (meaning this test can be used) for the duration of the COVID-19 declaration under Section 564(b)(1) of the Act, 21 U.S.C. section 360bbb-3(b)(1), unless the authorization is terminated or revoked sooner.  Performed at [facility], [address] [place], [place] [address]   MRSA PCR Screening     Status: None   Collection Time: 06/18/20  1:37 AM   Specimen: Nasal Mucosa; Nasopharyngeal  Result Value Ref Range Status   MRSA by PCR NEGATIVE NEGATIVE Final    Comment:        The GeneXpert MRSA Assay (FDA approved  for NASAL specimens only), is one component of a comprehensive MRSA colonization surveillance program. It is not intended to diagnose MRSA infection nor to guide or monitor treatment for MRSA infections. Performed at [facility], [address] [place], [place] [address]     Procedures and diagnostic studies:  MR TOES RIGHT WO CONTRAST  Result Date: 06/21/2020 CLINICAL DATA:  Diabetic foot infection EXAM: MRI OF THE RIGHT TOES WITHOUT CONTRAST TECHNIQUE: Multiplanar, multisequence MR imaging of the right was performed. No intravenous contrast was administered. COMPARISON:  Radiograph same day FINDINGS: Bones/Joint/Cartilage There is area of erosive type change seen at the medial aspect of the distal phalanx. There is increased T2 hyperintense signal seen within the distal phalanx without T1 hypointensity. There is mildly increased T2 hyperintense signal seen within the midfoot. The remainder of the osseous structures are normal appearance without acute fracture or pathologic marrow infiltration. Ligaments The Lisfranc ligaments are intact. Muscles and Tendons Mild fatty atrophy seen within the muscles of the forefoot with diffusely increased signal, likely due to chronic denervation atrophy. The tendons appear to be intact. Soft tissues Area of superficial ulceration along the medial aspect of the distal first phalanx. No loculated fluid collections are seen. There is skin thickening and subcutaneous edema seen along the dorsum of the midfoot. IMPRESSION: Findings which may be suggestive of chronic  osteomyelitis or inflammatory arthropathy of the distal first phalanx with surrounding reactive marrow. No definite evidence of acute osteomyelitis. Diffuse dorsal subcutaneous edema which may be due to cellulitis. Electronically Signed   By: Prudencio Pair M.D.   On: 06/21/2020 01:43               LOS: 5 days   Kyndahl Jablon  [facility]   Pager on [email or web address]. If  7PM-7AM, please contact night-coverage at [email or web address]     06/22/2020, 12:07 PM

## 2020-06-22 NOTE — Progress Notes (Signed)
Pt alertness decreased during routine V/S. Bowie offset in nares, O2 level in 80s. RN changed to proper position and increased O2 from 2 to 3 liters. Oxygen increased to 93%. Skin clammy; checked blood sugar 57. BP,temp, HR normal. Gave amp of Dextrose for hypoglycemia. Blood sugar increased to 202. Notified MD via secure chat. Lung sounds with increased crackles. However pt not ShOB. Mentation slightly decreased; pt stated name; did not answer his location correctly. Lethargic. Will continue to monitor.

## 2020-06-22 NOTE — Plan of Care (Signed)
  Problem: Education: Goal: Knowledge of General Education information will improve Description: Including pain rating scale, medication(s)/side effects and non-pharmacologic comfort measures Outcome: Progressing   Problem: Health Behavior/Discharge Planning: Goal: Ability to manage health-related needs will improve Outcome: Progressing   Problem: Clinical Measurements: Goal: Ability to maintain clinical measurements within normal limits will improve Outcome: Progressing Goal: Will remain free from infection Outcome: Adequate for Discharge Goal: Diagnostic test results will improve Outcome: Progressing Goal: Respiratory complications will improve Outcome: Adequate for Discharge Goal: Cardiovascular complication will be avoided Outcome: Adequate for Discharge   Problem: Activity: Goal: Risk for activity intolerance will decrease Outcome: Progressing

## 2020-06-23 LAB — BASIC METABOLIC PANEL
Anion gap: 9 (ref 5–15)
BUN: 22 mg/dL — ABNORMAL HIGH (ref 6–20)
CO2: 27 mmol/L (ref 22–32)
Calcium: 7.9 mg/dL — ABNORMAL LOW (ref 8.9–10.3)
Chloride: 105 mmol/L (ref 98–111)
Creatinine, Ser: 3.33 mg/dL — ABNORMAL HIGH (ref 0.61–1.24)
GFR, Estimated: 20 mL/min — ABNORMAL LOW (ref >60)
Glucose, Bld: 178 mg/dL — ABNORMAL HIGH (ref 70–99)
Potassium: 4.5 mmol/L (ref 3.5–5.1)
Sodium: 141 mmol/L (ref 135–145)

## 2020-06-23 LAB — GLUCOSE, CAPILLARY
Glucose-Capillary: 157 mg/dL — ABNORMAL HIGH (ref 70–99)
Glucose-Capillary: 160 mg/dL — ABNORMAL HIGH (ref 70–99)
Glucose-Capillary: 266 mg/dL — ABNORMAL HIGH (ref 70–99)
Glucose-Capillary: 301 mg/dL — ABNORMAL HIGH (ref 70–99)
Glucose-Capillary: 314 mg/dL — ABNORMAL HIGH (ref 70–99)
Glucose-Capillary: 331 mg/dL — ABNORMAL HIGH (ref 70–99)

## 2020-06-23 LAB — CBC WITH DIFFERENTIAL/PLATELET
Abs Immature Granulocytes: 0.01 10*3/uL (ref 0.00–0.07)
Basophils Absolute: 0 10*3/uL (ref 0.0–0.1)
Basophils Relative: 1 %
Eosinophils Absolute: 0.1 10*3/uL (ref 0.0–0.5)
Eosinophils Relative: 2 %
HCT: 30 % — ABNORMAL LOW (ref 39.0–52.0)
Hemoglobin: 9.4 g/dL — ABNORMAL LOW (ref 13.0–17.0)
Immature Granulocytes: 0 %
Lymphocytes Relative: 23 %
Lymphs Abs: 1 10*3/uL (ref 0.7–4.0)
MCH: 28.7 pg (ref 26.0–34.0)
MCHC: 31.3 g/dL (ref 30.0–36.0)
MCV: 91.5 fL (ref 80.0–100.0)
Monocytes Absolute: 0.3 10*3/uL (ref 0.1–1.0)
Monocytes Relative: 8 %
Neutro Abs: 2.8 10*3/uL (ref 1.7–7.7)
Neutrophils Relative %: 66 %
Platelets: 98 10*3/uL — ABNORMAL LOW (ref 150–400)
RBC: 3.28 MIL/uL — ABNORMAL LOW (ref 4.22–5.81)
RDW: 18.3 % — ABNORMAL HIGH (ref 11.5–15.5)
WBC: 4.2 10*3/uL (ref 4.0–10.5)
nRBC: 0 % (ref 0.0–0.2)

## 2020-06-23 LAB — PHOSPHORUS: Phosphorus: 3.3 mg/dL (ref 2.5–4.6)

## 2020-06-23 NOTE — Plan of Care (Signed)

## 2020-06-23 NOTE — Progress Notes (Signed)
[name] NAME: [name]    MR#:  [id number]  DATE OF BIRTH:  [birth year]  SUBJECTIVE:    REVIEW OF SYSTEMS:   ROS Tolerating Diet: Tolerating PT:   DRUG ALLERGIES:   Allergies  Allergen Reactions  . Ferrous Gluconate Nausea And Vomiting  . Other     VITALS:  Blood pressure 120/81, pulse 82, temperature 97.8 F (36.6 C), resp. rate 19, height '5\' 11"'$  (1.803 m), weight 62.4 kg, SpO2 95 %.  PHYSICAL EXAMINATION:   Physical Exam  GENERAL:  60 y.o.-year-old patient lying in the bed with no acute distress.  HEENT: Head atraumatic, normocephalic. Oropharynx and nasopharynx clear.  NECK:  Supple, no jugular venous distention. No thyroid enlargement, no tenderness.  LUNGS: Normal breath sounds bilaterally, no wheezing, rales, rhonchi. No use of accessory muscles of respiration.  CARDIOVASCULAR: S1, S2 normal. No murmurs, rubs, or gallops.  ABDOMEN: Soft, nontender, nondistended. Bowel sounds present. No organomegaly or mass.  EXTREMITIES: No cyanosis, clubbing or edema b/l.    NEUROLOGIC: Cranial nerves II through XII are intact. No focal Motor or sensory deficits b/l.   PSYCHIATRIC:  patient is alert and oriented x 3.  SKIN: No obvious rash, lesion, or ulcer.   LABORATORY PANEL:  CBC Recent Labs  Lab 06/23/20 0531  WBC 4.2  HGB 9.4*  HCT 30.0*  PLT 98*    Chemistries  Recent Labs  Lab 06/17/20 1338 06/18/20 1117 06/23/20 0531  NA 143   < > 141  K 5.1   < > 4.5  CL 106   < > 105  CO2 22   < > 27  GLUCOSE 72   < > 178*  BUN 78*   < > 22*  CREATININE 7.78*   < > 3.33*  CALCIUM 8.4*   < > 7.9*  AST 19  --   --   ALT 11  --   --   ALKPHOS 188*  --   --   BILITOT 0.5  --   --    < > = values in this interval not displayed.   Cardiac Enzymes No results for input(s): TROPONINI in the last 168 hours. RADIOLOGY:  No results found. ASSESSMENT AND PLAN:  [name] a 60 y.o.malewithmedical  history significant forend-stage renal disease on hemodialysis(dialysis days are T/Th/S),insulin-dependent diabetes mellitus, hypertension, CVA, GERD who was brought into the ER by EMS from the dialysis center for evaluation of mental status changes.  Patient was found lethargic and unresponsive and his blood sugar was 26  Acute metabolic encephalopathy secondary to hypoglycemia -- Mental status is back to baseline.  Insulin-dependent brittle diabetes with labile BS -Hemoglobin A1c is 7.8.  He was taking Lantus 6 units nightly at home. --Continue Lantus 2 units nightly.   --Decrease sliding scale insulin from sensitive scale to very sensitive scale.  --Recommend bedtime snacks to reduce risk of morning hypoglycemia.  Right foot swelling, probable chronic osteomyelitis of right big toe: --MRI of the right big toe showed findings suggestive of chronic osteomyelitis or inflammatory arthropathy of the distal phalanx with surrounding reactive marrow.    Hypothermia: Resolved.  This was likely due to hypoglycemia.  IV antibiotics have been discontinued.  Chronic systolic CHF, severe mitral regurgitation: Compensated.  Last known EF on 12/07/2019 was 45 to 50%.  Continue Entresto and carvedilol.  ESRD: Follow-up with nephrologist for hemodialysis  Chronic hypoxic respiratory failure: He uses 2 L/min oxygen  at home.  Thrombocytopenia: Platelet count is improving.  Continue to monitor.  History of stroke: Continue aspirin   PT and OT recommend discharge to SNF.  Awaiting placement to SNF.  Procedures:none Family communication : Consults :nephrology CODE STATUS: FULL DVT Prophylaxis :heparin Level of care: Med-Surg Status is: Inpatient  Remains inpatient appropriate because:Unsafe d/c plan   Dispo: The patient is from: Home              Anticipated d/c is to: SNF              Anticipated d/c date is: 1 day              Patient currently is medically stable to d/c.    Difficult to place patient Yes  Patient is medically best at baseline. He has labile diabetes overall mentation is back to baseline. Per TOC no bed offers so far. Patient is adamant [place] of county.     TOTAL TIME TAKING CARE OF THIS PATIENT: 25 minutes.  >50% time spent on counselling and coordination of care  Note: This dictation was prepared with Dragon dictation along with smaller phrase technology. Any transcriptional errors that result from this process are unintentional.  Fritzi Mandes M.D    [facility]   CC: Primary care physician; [name], MDPatient ID: [name], male   DOB: [birth year], 61 y.o.   MRN: [id number]

## 2020-06-23 NOTE — Progress Notes (Signed)
[facility]  ROUNDING NOTE   Subjective:   Patient seen while eating breakfast No complaints of shortness of breath Denies chest pain Able to eat with no nausea  Objective:  Vital signs in last 24 hours:  Temp:  [97.6 F (36.4 C)-98.6 F (37 C)] 97.8 F (36.6 C) (02/10 1201) Pulse Rate:  [69-84] 82 (02/10 1201) Resp:  [13-27] 19 (02/10 1201) BP: (120-145)/(68-91) 120/81 (02/10 1201) SpO2:  [93 %-99 %] 95 % (02/10 1201)  Weight change:  Filed Weights   06/19/20 0400 06/20/20 0400 06/21/20 0221  Weight: 60 kg 60.1 kg 62.4 kg    Intake/Output: I/O last 3 completed shifts: In: 360 [P.O.:360] Out: 1800 [Urine:250; Other:1000; Stool:550]   Intake/Output this shift:  Total I/O In: 120 [P.O.:120] Out: -   Physical Exam: General:  NAD  Head:  Normocephalic, atraumatic. Moist oral mucosal  Eyes:  Sclerae and conjunctivae clear  Lungs:   Respirations symmetrical, unlabored, lungs clear, O2-4L  Heart:  S1-S2 present  Abdomen:   Soft, nontender, nondistended  Extremities:  No peripheral edema.  Neurologic:  Awake, alert, answers simple questions  Skin:  No lesions or rashes  Access:  Rt AVG, + bruit/thrill    Basic Metabolic Panel: Recent Labs  Lab 06/17/20 1338 06/18/20 1117 06/20/20 2147 06/23/20 0531 06/23/20 0700  NA 143 138  --  141  --   K 5.1 4.1  --  4.5  --   CL 106 105  --  105  --   CO2 22 23  --  27  --   GLUCOSE 72 235* 425* 178*  --   BUN 78* 46*  --  22*  --   CREATININE 7.78* 5.25*  --  3.33*  --   CALCIUM 8.4* 7.7*  --  7.9*  --   PHOS  --   --   --   --  3.3    Liver Function Tests: Recent Labs  Lab 06/17/20 1338  AST 19  ALT 11  ALKPHOS 188*  BILITOT 0.5  PROT 6.8  ALBUMIN 2.9*   No results for input(s): LIPASE, AMYLASE in the last 168 hours. No results for input(s): AMMONIA in the last 168 hours.  CBC: Recent Labs  Lab 06/17/20 1338 06/18/20 1117 06/19/20 0433 06/20/20 0427 06/23/20 0531  WBC 4.5 4.7 5.2  5.2 4.2  NEUTROABS 3.1  --  3.2 3.4 2.8  HGB 10.9* 8.6* 9.0* 10.1* 9.4*  HCT 36.6* 27.5* 29.1* 33.2* 30.0*  MCV 93.1 90.2 90.7 92.5 91.5  PLT 93* 81* 96* 100* 98*    Cardiac Enzymes: No results for input(s): CKTOTAL, CKMB, CKMBINDEX, TROPONINI in the last 168 hours.  BNP: Invalid input(s): POCBNP  CBG: Recent Labs  Lab 06/22/20 2105 06/23/20 0017 06/23/20 0421 06/23/20 0745 06/23/20 1159  GLUCAP 372* 266* 160* 157* 331*    Microbiology: Results for orders placed or performed during the hospital encounter of 06/17/20  Blood Culture (routine x 2)     Status: None   Collection Time: 06/17/20  2:09 PM   Specimen: BLOOD  Result Value Ref Range Status   Specimen Description BLOOD LEFT ANTECUBITAL  Final   Special Requests   Final    BOTTLES DRAWN AEROBIC AND ANAEROBIC Blood Culture adequate volume   Culture   Final    NO GROWTH 5 DAYS Performed at [facility], [address], [place], [place] [address]    Report Status 06/22/2020 FINAL  Final  Blood Culture (routine  x 2)     Status: None   Collection Time: 06/17/20  2:09 PM   Specimen: BLOOD  Result Value Ref Range Status   Specimen Description BLOOD BLOOD LEFT FOREARM  Final   Special Requests   Final    BOTTLES DRAWN AEROBIC AND ANAEROBIC Blood Culture adequate volume   Culture   Final    NO GROWTH 5 DAYS Performed at [facility], [address] [place], [place] [address]    Report Status 06/22/2020 FINAL  Final  SARS Coronavirus 2 by RT PCR (hospital order, performed in [facility] hospital lab) Nasopharyngeal Nasopharyngeal Swab     Status: None   Collection Time: 06/17/20  3:15 PM   Specimen: Nasopharyngeal Swab  Result Value Ref Range Status   SARS Coronavirus 2 NEGATIVE NEGATIVE Final    Comment: (NOTE) SARS-CoV-2 target nucleic acids are NOT DETECTED.  The SARS-CoV-2 RNA is generally detectable in upper and lower respiratory specimens during the acute phase of infection. The  lowest concentration of SARS-CoV-2 viral copies this assay can detect is 250 copies / mL. A negative result does not preclude SARS-CoV-2 infection and should not be used as the sole basis for treatment or other patient management decisions.  A negative result may occur with improper specimen collection / handling, submission of specimen other than nasopharyngeal swab, presence of viral mutation(s) within the areas targeted by this assay, and inadequate number of viral copies (<250 copies / mL). A negative result must be combined with clinical observations, patient history, and epidemiological information.  Fact Sheet for Patients:   StrictlyIdeas.no  Fact Sheet for Healthcare Providers: BankingDealers.co.za  This test is not yet approved or  cleared by the [place] FDA and has been authorized for detection and/or diagnosis of SARS-CoV-2 by FDA under an Emergency Use Authorization (EUA).  This EUA will remain in effect (meaning this test can be used) for the duration of the COVID-19 declaration under Section 564(b)(1) of the Act, 21 U.S.C. section 360bbb-3(b)(1), unless the authorization is terminated or revoked sooner.  Performed at [facility], [address] [place], [place] [address]   MRSA PCR Screening     Status: None   Collection Time: 06/18/20  1:37 AM   Specimen: Nasal Mucosa; Nasopharyngeal  Result Value Ref Range Status   MRSA by PCR NEGATIVE NEGATIVE Final    Comment:        The GeneXpert MRSA Assay (FDA approved for NASAL specimens only), is one component of a comprehensive MRSA colonization surveillance program. It is not intended to diagnose MRSA infection nor to guide or monitor treatment for MRSA infections. Performed at [facility], [address] [place],  [address]     Coagulation Studies: No results for input(s): LABPROT, INR in the last 72 hours.  Urinalysis: No  results for input(s): COLORURINE, LABSPEC, PHURINE, GLUCOSEU, HGBUR, BILIRUBINUR, KETONESUR, PROTEINUR, UROBILINOGEN, NITRITE, LEUKOCYTESUR in the last 72 hours.  Invalid input(s): APPERANCEUR    Imaging: No results found.   Medications:    . aspirin EC  81 mg Oral Daily  . carvedilol  6.25 mg Oral BID WC  . Chlorhexidine Gluconate Cloth  6 each Topical Q0600  . epoetin alfa  4,000 Units Intravenous Q M,W,F-HD  . feeding supplement (NEPRO CARB STEADY)  237 mL Oral TID BM  . hydrALAZINE  25 mg Oral BID  . insulin aspart  0-6 Units Subcutaneous TID WC  . insulin glargine  2 Units Subcutaneous QHS  .  lidocaine-prilocaine  1 application Topical UD  . melatonin  5 mg Oral QHS  . mirtazapine  7.5 mg Oral QHS  . multivitamin  1 tablet Oral Daily  . sacubitril-valsartan  1 tablet Oral BID  . sertraline  150 mg Oral Daily   acetaminophen **OR** acetaminophen, ondansetron **OR** ondansetron (ZOFRAN) IV  Assessment/ Plan:  [name] is a 60 y.o.  male with history of end-stage renal disease on hemodialysis Tuesdays Thursdays and Saturdays.  He was sent to the ED from his dialysis center due to altered mental status.  He was hypoglycemic with blood sugar 26 on arrival.  Patient missed his dialysis treatment for over 1 week  Prior to this admission..  # ESRD on Dialysis TTS Received dialysis yesterday. Successful treatment with 1L removed Next treatment planned for Friday   #Anemia with CKD HGB decreased this morning. Will continue to monitor. Lab Results  Component Value Date   HGB 9.4 (L) 06/23/2020  EPO 4,000 units given during dialysis  #Secondary Hyperparathyroidism Lab Results  Component Value Date   PTH 100 (H) 04/20/2020   CALCIUM 7.9 (L) 06/23/2020   CAION 1.00 (L) 12/25/2019   PHOS 3.3 06/23/2020  Current labs are slightly improved from yesterday Will continue to monitor  #Diabetes type 2 with CKD Acceptable glucose levels for 24 hours Maintaining  nutritional intake     LOS: 6 Colon Flattery, NP 2/10/202212:40 PM

## 2020-06-23 NOTE — Care Management Important Message (Signed)
Important Message  Patient Details  Name: [name] MRN: [id number] Date of Birth: [birth year]   Medicare Important Message Given:  Yes     Dannette Barbara 06/23/2020, 2:00 PM

## 2020-06-23 NOTE — Progress Notes (Signed)
Physical Therapy Treatment Patient Details Name: [name] MRN: [id number] DOB: Oct 28, 1960 Today's Date: 06/23/2020    History of Present Illness Pt is a 60 y/o M admitted on 06/17/20 with c/c of AMS. Pt was brought to the ED by EMS from dialysis center for evaluation of change in mental status. Pt lethargic & found to have blood sugar of 26. PMH: ESRD on HD (T/Th/S), IDDM, HTN, CVA, GERD, CHF, EtOH abuse, Hyperlipidemia    PT Comments    Pt resting in bed upon PT arrival; pt initially refusing PT session but pt's caregiver strongly encouraging pt to participate.  Pt finally agreed to participating in therapy (with the encouragement of caregiver) but pt did not appear happy (although pt refused to stop participating in PT session until he did what therapist wanted him to do).  SBA with bed mobility; min to mod assist to stand up to RW from bed (x3 trials); and CGA to min assist to ambulate 50 feet with RW.  Post ambulation pt requesting to toilet so therapist assisted pt to bathroom for toileting needs and then assisted pt back to bed when pt was finished toileting.  Pt appearing comfortable in bed end of session; caregiver present.  Will continue to focus on strengthening and progressive functional mobility per pt tolerance.   Follow Up Recommendations  SNF     Equipment Recommendations  Rolling walker with 5" wheels;3in1 (PT)    Recommendations for Other Services       Precautions / Restrictions Precautions Precautions: Fall Restrictions Weight Bearing Restrictions: No    Mobility  Bed Mobility Overal bed mobility: Needs Assistance Bed Mobility: Supine to Sit;Sit to Supine     Supine to sit: Supervision;HOB elevated Sit to supine: Supervision;HOB elevated   General bed mobility comments: SBA for safety    Transfers Overall transfer level: Needs assistance Equipment used: Rolling walker (2 wheeled) Transfers: Sit to/from Stand Sit to Stand: Min assist;Mod assist          General transfer comment: assist to initiate and come to full stand x3 trials from bed (RW use) and x1 trial from toilet (use of grab bar)  Ambulation/Gait Ambulation/Gait assistance: Min guard;Min assist Gait Distance (Feet):  (50 feet; 10 feet x2 (to/from bathroom)) Assistive device: Rolling walker (2 wheeled)   Gait velocity: decreased   General Gait Details: pt unsteady requiring min assist to steady at times; partial step through gait pattern   Stairs             Wheelchair Mobility    Modified Rankin (Stroke Patients Only)       Balance Overall balance assessment: Needs assistance Sitting-balance support: No upper extremity supported;Feet supported Sitting balance-Leahy Scale: Good Sitting balance - Comments: steady sitting reaching within BOS   Standing balance support: Single extremity supported Standing balance-Leahy Scale: Fair Standing balance comment: pt requiring at least single UE support for standing balance                            Cognition Arousal/Alertness: Awake/alert Behavior During Therapy: Flat affect Overall Cognitive Status: Within Functional Limits for tasks assessed                                        Exercises      General Comments        Pertinent Vitals/Pain  Pain Assessment: No/denies pain  Pt's O2 sats WFL on 2 L O2 via nasal cannula beginning/end of session.  Pt took off O2 during session--desaturated to 83%--but O2 recovered to mid 90's with sitting rest break.    Home Living                      Prior Function            PT Goals (current goals can now be found in the care plan section) Acute Rehab PT Goals Patient Stated Goal: get better PT Goal Formulation: With patient Time For Goal Achievement: 07/04/20 Progress towards PT goals: Progressing toward goals    Frequency    Min 2X/week      PT Plan Current plan remains appropriate    Co-evaluation               AM-PAC PT "6 Clicks" Mobility   Outcome Measure  Help needed turning from your back to your side while in a flat bed without using bedrails?: None Help needed moving from lying on your back to sitting on the side of a flat bed without using bedrails?: A Little Help needed moving to and from a bed to a chair (including a wheelchair)?: A Little Help needed standing up from a chair using your arms (e.g., wheelchair or bedside chair)?: A Lot Help needed to walk in hospital room?: A Little Help needed climbing 3-5 steps with a railing? : A Lot 6 Click Score: 17    End of Session Equipment Utilized During Treatment: Gait belt;Oxygen (2 L O2 via nasal cannula) Activity Tolerance: Patient tolerated treatment well Patient left: in bed;with call bell/phone within reach;with bed alarm set;with family/visitor present Nurse Communication: Mobility status;Precautions PT Visit Diagnosis: Muscle weakness (generalized) (M62.81);Difficulty in walking, not elsewhere classified (R26.2)     Time: [id number] PT Time Calculation (min) (ACUTE ONLY): 40 min  Charges:  $Gait Training: 8-22 mins $Therapeutic Activity: 23-37 mins                    Leitha Bleak, PT 06/23/20, 4:17 PM

## 2020-06-23 NOTE — Progress Notes (Signed)
Inpatient Diabetes Program Recommendations  AACE/ADA: New Consensus Statement on Inpatient Glycemic Control   Target Ranges:  Prepandial:   less than 140 mg/dL      Peak postprandial:   less than 180 mg/dL (1-2 hours)      Critically ill patients:  140 - 180 mg/dL   Results for [name], [name] (MRN [id number]) as of 06/23/2020 10:54  Ref. Range 06/22/2020 07:54 06/22/2020 08:41 06/22/2020 14:45 06/22/2020 17:44 06/22/2020 21:05 06/23/2020 00:17 06/23/2020 04:21 06/23/2020 07:45  Glucose-Capillary Latest Ref Range: 70 - 99 mg/dL 50 (L) 156 (H) 60 (L) 261 (H) 372 (H) 266 (H) 160 (H) 157 (H)   Review of Glycemic Control  Diabetes history: DM2 Outpatient Diabetes medications: Lantus 6 units QHS, Humalog in the am and at bedtime Current orders for Inpatient glycemic control: Lantus 2 units QHS, Novolog 0-6 units TID with meals  Inpatient Diabetes Program Recommendations:    Insulin: Please consider ordering Novolog 2 units TID with meals for meal coverage if patient eats at least 50% of meals.  Thanks, Barnie Alderman, RN, MSN, CDE Diabetes Coordinator Inpatient Diabetes Program [phone] (Team Pager from 8am to 5pm)

## 2020-06-23 NOTE — Plan of Care (Signed)

## 2020-06-24 LAB — RESP PANEL BY RT-PCR (FLU A&B, COVID) ARPGX2
Influenza A by PCR: NEGATIVE
Influenza B by PCR: NEGATIVE
SARS Coronavirus 2 by RT PCR: NEGATIVE

## 2020-06-24 LAB — GLUCOSE, CAPILLARY
Glucose-Capillary: 137 mg/dL — ABNORMAL HIGH (ref 70–99)
Glucose-Capillary: 167 mg/dL — ABNORMAL HIGH (ref 70–99)
Glucose-Capillary: 307 mg/dL — ABNORMAL HIGH (ref 70–99)
Glucose-Capillary: 341 mg/dL — ABNORMAL HIGH (ref 70–99)

## 2020-06-24 NOTE — Progress Notes (Signed)
[facility]  ROUNDING NOTE   Subjective:   Patient seen during dialysis    HEMODIALYSIS FLOWSHEET:  Blood Flow Rate (mL/min): 300 mL/min Arterial Pressure (mmHg): -120 mmHg Venous Pressure (mmHg): 80 mmHg Transmembrane Pressure (mmHg): 60 mmHg Ultrafiltration Rate (mL/min): 500 mL/min Dialysate Flow Rate (mL/min): 600 ml/min Conductivity: Machine : 13.9 Conductivity: Machine : 13.9 Dialysis Fluid Bolus: Normal Saline Bolus Amount (mL): 250 mL  Patient alert and oriented Denies chest pain or shortness of breath Able to eat a good breakfast No reports of nausea   Objective:  Vital signs in last 24 hours:  Temp:  [97.6 F (36.4 C)-98.3 F (36.8 C)] 98.2 F (36.8 C) (02/11 0732) Pulse Rate:  [72-82] 81 (02/11 1145) Resp:  [14-21] 19 (02/11 1145) BP: (120-153)/(70-90) 124/75 (02/11 1145) SpO2:  [90 %-100 %] 96 % (02/11 1145)  Weight change:  Filed Weights   06/19/20 0400 06/20/20 0400 06/21/20 0221  Weight: 60 kg 60.1 kg 62.4 kg    Intake/Output: I/O last 3 completed shifts: In: 837 [P.O.:600; NG/GT:237] Out: 550 [Stool:550]   Intake/Output this shift:  No intake/output data recorded.  Physical Exam: General:  NAD  Head:  Normocephalic, atraumatic. Moist oral mucosal  Eyes:  Sclerae and conjunctivae clear  Lungs:   Respirations symmetrical, unlabored, lungs clear, O2-4L  Heart:  S1-S2 present  Abdomen:   Soft, nontender, nondistended  Extremities:  No peripheral edema.  Neurologic:  Awake, alert, answers simple questions  Skin:  No lesions or rashes  Access:  Rt AVG, + bruit/thrill    Basic Metabolic Panel: Recent Labs  Lab 06/17/20 1338 06/18/20 1117 06/20/20 2147 06/23/20 0531 06/23/20 0700  NA 143 138  --  141  --   K 5.1 4.1  --  4.5  --   CL 106 105  --  105  --   CO2 22 23  --  27  --   GLUCOSE 72 235* 425* 178*  --   BUN 78* 46*  --  22*  --   CREATININE 7.78* 5.25*  --  3.33*  --   CALCIUM 8.4* 7.7*  --  7.9*  --   PHOS   --   --   --   --  3.3    Liver Function Tests: Recent Labs  Lab 06/17/20 1338  AST 19  ALT 11  ALKPHOS 188*  BILITOT 0.5  PROT 6.8  ALBUMIN 2.9*   No results for input(s): LIPASE, AMYLASE in the last 168 hours. No results for input(s): AMMONIA in the last 168 hours.  CBC: Recent Labs  Lab 06/17/20 1338 06/18/20 1117 06/19/20 0433 06/20/20 0427 06/23/20 0531  WBC 4.5 4.7 5.2 5.2 4.2  NEUTROABS 3.1  --  3.2 3.4 2.8  HGB 10.9* 8.6* 9.0* 10.1* 9.4*  HCT 36.6* 27.5* 29.1* 33.2* 30.0*  MCV 93.1 90.2 90.7 92.5 91.5  PLT 93* 81* 96* 100* 98*    Cardiac Enzymes: No results for input(s): CKTOTAL, CKMB, CKMBINDEX, TROPONINI in the last 168 hours.  BNP: Invalid input(s): POCBNP  CBG: Recent Labs  Lab 06/23/20 0745 06/23/20 1159 06/23/20 1632 06/23/20 2014 06/24/20 0734  GLUCAP 157* 331* 314* 301* 3*    Microbiology: Results for orders placed or performed during the hospital encounter of 06/17/20  Blood Culture (routine x 2)     Status: None   Collection Time: 06/17/20  2:09 PM   Specimen: BLOOD  Result Value Ref Range Status   Specimen Description BLOOD LEFT ANTECUBITAL  Final   Special Requests   Final    BOTTLES DRAWN AEROBIC AND ANAEROBIC Blood Culture adequate volume   Culture   Final    NO GROWTH 5 DAYS Performed at [facility], [address] [place], [place] [address]    Report Status 06/22/2020 FINAL  Final  Blood Culture (routine x 2)     Status: None   Collection Time: 06/17/20  2:09 PM   Specimen: BLOOD  Result Value Ref Range Status   Specimen Description BLOOD BLOOD LEFT FOREARM  Final   Special Requests   Final    BOTTLES DRAWN AEROBIC AND ANAEROBIC Blood Culture adequate volume   Culture   Final    NO GROWTH 5 DAYS Performed at [facility], [address], [place], [place] [address]    Report Status 06/22/2020 FINAL  Final  SARS Coronavirus 2 by RT PCR (hospital order, performed in [facility] hospital lab)  Nasopharyngeal Nasopharyngeal Swab     Status: None   Collection Time: 06/17/20  3:15 PM   Specimen: Nasopharyngeal Swab  Result Value Ref Range Status   SARS Coronavirus 2 NEGATIVE NEGATIVE Final    Comment: (NOTE) SARS-CoV-2 target nucleic acids are NOT DETECTED.  The SARS-CoV-2 RNA is generally detectable in upper and lower respiratory specimens during the acute phase of infection. The lowest concentration of SARS-CoV-2 viral copies this assay can detect is 250 copies / mL. A negative result does not preclude SARS-CoV-2 infection and should not be used as the sole basis for treatment or other patient management decisions.  A negative result may occur with improper specimen collection / handling, submission of specimen other than nasopharyngeal swab, presence of viral mutation(s) within the areas targeted by this assay, and inadequate number of viral copies (<250 copies / mL). A negative result must be combined with clinical observations, patient history, and epidemiological information.  Fact Sheet for Patients:   StrictlyIdeas.no  Fact Sheet for Healthcare Providers: BankingDealers.co.za  This test is not yet approved or  cleared by the [place] FDA and has been authorized for detection and/or diagnosis of SARS-CoV-2 by FDA under an Emergency Use Authorization (EUA).  This EUA will remain in effect (meaning this test can be used) for the duration of the COVID-19 declaration under Section 564(b)(1) of the Act, 21 U.S.C. section 360bbb-3(b)(1), unless the authorization is terminated or revoked sooner.  Performed at [facility], [address] [place], [place] [address]   MRSA PCR Screening     Status: None   Collection Time: 06/18/20  1:37 AM   Specimen: Nasal Mucosa; Nasopharyngeal  Result Value Ref Range Status   MRSA by PCR NEGATIVE NEGATIVE Final    Comment:        The GeneXpert MRSA Assay (FDA approved  for NASAL specimens only), is one component of a comprehensive MRSA colonization surveillance program. It is not intended to diagnose MRSA infection nor to guide or monitor treatment for MRSA infections. Performed at [facility], [address] [place], [place] [address]     Coagulation Studies: No results for input(s): LABPROT, INR in the last 72 hours.  Urinalysis: No results for input(s): COLORURINE, LABSPEC, PHURINE, GLUCOSEU, HGBUR, BILIRUBINUR, KETONESUR, PROTEINUR, UROBILINOGEN, NITRITE, LEUKOCYTESUR in the last 72 hours.  Invalid input(s): APPERANCEUR    Imaging: No results found.   Medications:    . aspirin EC  81 mg Oral Daily  . carvedilol  6.25 mg Oral BID WC  . Chlorhexidine Gluconate  Cloth  6 each Topical [id number]  . epoetin alfa  4,000 Units Intravenous Q M,W,F-HD  . feeding supplement (NEPRO CARB STEADY)  237 mL Oral TID BM  . hydrALAZINE  25 mg Oral BID  . insulin aspart  0-6 Units Subcutaneous TID WC  . insulin glargine  2 Units Subcutaneous QHS  . lidocaine-prilocaine  1 application Topical UD  . melatonin  5 mg Oral QHS  . mirtazapine  7.5 mg Oral QHS  . multivitamin  1 tablet Oral Daily  . sacubitril-valsartan  1 tablet Oral BID  . sertraline  150 mg Oral Daily   acetaminophen **OR** acetaminophen, ondansetron **OR** ondansetron (ZOFRAN) IV  Assessment/ Plan:  [name] is a 60 y.o.  male with history of end-stage renal disease on hemodialysis Tuesdays Thursdays and Saturdays.  He was sent to the ED from his dialysis center due to altered mental status.  He was hypoglycemic with blood sugar 26 on arrival.  Patient missed his dialysis treatment for over 1 week  Prior to this admission..  # ESRD on Dialysis TTS Currently receiving dialysis No acute concerns at this time Will return to TTS schedule once discharged Will maintain MWF non-covid schedule while in the hospital   #Anemia with CKD  Lab Results  Component Value Date    HGB 9.4 (L) 06/23/2020  EPO given during dialysis to manage   #Secondary Hyperparathyroidism Lab Results  Component Value Date   PTH 100 (H) 04/20/2020   CALCIUM 7.9 (L) 06/23/2020   CAION 1.00 (L) 12/25/2019   PHOS 3.3 06/23/2020  monitoring these labs closely  #Diabetes type 2 with CKD Continuing to monitor glucose levels Improvements seen with increased, consistent meal intake     LOS: 7 Colon Flattery, NP 2/11/202211:59 AM

## 2020-06-24 NOTE — TOC Progression Note (Signed)
Transition of Care (TOC) - Progression Note    Patient Details  Name: Demonta D Spagna MRN: [id number] Date of Birth: 06/28/1960  Transition of Care (TOC) CM/SW Contact  Misty D Green, RN Phone Number: 06/24/2020, 2:12 PM  Clinical Narrative:   RNCM met with patient in room to discuss bed offer for [facility]. After some discussion patient was agreeable to accept bed offer. Per request of patient his wife was notified by phone. Patient will have dialysis today and per Dr. Lateef he can resume dialysis at his normal clinic on Tuesday at 6:15.  RNCM accepted bed in hub and reached out to Tanya with [facility], they will be ready for him tomorrow.     Expected Discharge Plan: Home/Self Care Barriers to Discharge: Continued Medical Work up  Expected Discharge Plan and Services Expected Discharge Plan: Home/Self Care In-house Referral: Clinical Social Work     Living arrangements for the past 2 months: Single Family Home                                       Social Determinants of Health (SDOH) Interventions    Readmission Risk Interventions Readmission Risk Prevention Plan 06/18/2020 04/22/2020 04/08/2020  Transportation Screening Complete Complete Complete  PCP or Specialist Appt within 3-5 Days - - -  HRI or Home Care Consult - - -  Palliative Care Screening - - -  Medication Review (RN Care Manager) Complete Complete Complete  PCP or Specialist appointment within 3-5 days of discharge Complete Complete Complete  HRI or Home Care Consult Complete Complete Complete  SW Recovery Care/Counseling Consult Complete - Complete  Palliative Care Screening Not Applicable Not Applicable Not Applicable  Skilled Nursing Facility Not Applicable Patient Refused Not Applicable  Some recent data might be hidden   

## 2020-06-24 NOTE — Progress Notes (Signed)
PT Cancellation Note  Patient Details Name: [name] MRN: [id number] DOB: 1961/05/02   Cancelled Treatment:    Reason Eval/Treat Not Completed: Patient at procedure or test/unavailable.  Pt currently not in room; pt noted with dialysis order for today.  Will re-attempt PT treatment session at a later date/time.  Leitha Bleak, PT 06/24/20, 10:38 AM

## 2020-06-24 NOTE — Progress Notes (Signed)
OT Cancellation Note  Patient Details Name: [name] MRN: [id number] DOB: [birth year]   Cancelled Treatment:    Reason Eval/Treat Not Completed: Patient at procedure or test/ unavailable. Pt out of room for dialysis. Will re-attempt OT tx at later date/time as pt is available and medically appropriate.   Hanley Hays, MPH, MS, OTR/L ascom [phone] 06/24/20, 11:11 AM

## 2020-06-24 NOTE — Plan of Care (Signed)

## 2020-06-24 NOTE — Progress Notes (Signed)
[facility] NAME: [name]    MR#:  [id number]  DATE OF BIRTH:  1961/04/18  SUBJECTIVE:  eating pretty well. Sugar stable. No episodes of hypoglycemia reported. Met wife outside in the hallway. Discussed with her the plan.  REVIEW OF SYSTEMS:   Review of Systems  Constitutional: Negative for chills, fever and weight loss.  HENT: Negative for ear discharge, ear pain and nosebleeds.   Eyes: Negative for blurred vision, pain and discharge.  Respiratory: Negative for sputum production, shortness of breath, wheezing and stridor.   Cardiovascular: Negative for chest pain, palpitations, orthopnea and PND.  Gastrointestinal: Negative for abdominal pain, diarrhea, nausea and vomiting.  Genitourinary: Negative for frequency and urgency.  Musculoskeletal: Negative for back pain and joint pain.  Neurological: Positive for weakness. Negative for sensory change, speech change and focal weakness.  Psychiatric/Behavioral: Negative for depression and hallucinations. The patient is not nervous/anxious.    Tolerating Diet:yes Tolerating PT: rehab  DRUG ALLERGIES:   Allergies  Allergen Reactions  . Ferrous Gluconate Nausea And Vomiting  . Other     VITALS:  Blood pressure 127/68, pulse 84, temperature 98.2 F (36.8 C), resp. rate 18, height 5' 11"  (1.803 m), weight 62.4 kg, SpO2 100 %.  PHYSICAL EXAMINATION:   Physical Exam  GENERAL:  60 y.o.-year-old patient lying in the bed with no acute distress. Chronically ill appearing.  HEENT: Head atraumatic, normocephalic. Oropharynx and nasopharynx clear. Poor dentition LUNGS: Normal breath sounds bilaterally, no wheezing, rales, rhonchi. No use of accessory muscles of respiration.  CARDIOVASCULAR: S1, S2 normal. No murmurs, rubs, or gallops.  ABDOMEN: Soft, nontender, nondistended. Bowel sounds present. No organomegaly or mass.  EXTREMITIES:dry skin, cracked toe nail, chronic arthritic  changes in foot NEUROLOGIC: Cranial nerves II through XII are intact. No focal Motor or sensory deficits b/l.  weak PSYCHIATRIC:  patient is alert and oriented x 3.  SKIN: No obvious rash, lesion, or ulcer.   LABORATORY PANEL:  CBC Recent Labs  Lab 06/23/20 0531  WBC 4.2  HGB 9.4*  HCT 30.0*  PLT 98*    Chemistries  Recent Labs  Lab 06/17/20 1338 06/18/20 1117 06/23/20 0531  NA 143   < > 141  K 5.1   < > 4.5  CL 106   < > 105  CO2 22   < > 27  GLUCOSE 72   < > 178*  BUN 78*   < > 22*  CREATININE 7.78*   < > 3.33*  CALCIUM 8.4*   < > 7.9*  AST 19  --   --   ALT 11  --   --   ALKPHOS 188*  --   --   BILITOT 0.5  --   --    < > = values in this interval not displayed.   Cardiac Enzymes No results for input(s): TROPONINI in the last 168 hours. RADIOLOGY:  No results found. ASSESSMENT AND PLAN:  [name] a 60 y.o.malewithmedical history significant forend-stage renal disease on hemodialysis(dialysis days are T/Th/S),insulin-dependent diabetes mellitus, hypertension, CVA, GERD who was brought into the ER by EMS from the dialysis center for evaluation of mental status changes.  Patient was found lethargic and unresponsive and his blood sugar was 26  Acute metabolic encephalopathy secondary to hypoglycemia -- Mental status is back to baseline.  Insulin-dependent brittle diabetes with labile BS -Hemoglobin A1c is 7.8.  He was taking Lantus 6 units nightly at home. --  Continue Lantus 2 units nightly.   --Decrease sliding scale insulin from sensitive scale to very sensitive scale.  --Recommend bedtime snacks to reduce risk of morning hypoglycemia.  Right foot swelling, probable chronic osteomyelitis of right big toe: --MRI of the right big toe showed findings suggestive of chronic osteomyelitis or inflammatory arthropathy of the distal phalanx with surrounding reactive marrow.   --d/w wife to have pt f/u Podiatry as out pt after discharged from  rehab  Chronic systolic CHF, severe mitral regurgitation:  --Compensated.  Last known EF on 12/07/2019 was 45 to 50%.  Continue Entresto and carvedilol.  ESRD: Follow-up with nephrologist for hemodialysis --2/11-- discussed with Dr. Minus Breeding. Patient will get dialysis today. He can discharge tomorrow to rehab and resume his outpatient dialysis from next Tuesday.  Chronic hypoxic respiratory failure: He uses 2 L/min oxygen at home.  Thrombocytopenia: Platelet count is improving.   History of stroke: Continue aspirin   PT and OT recommend discharge to SNF.    Procedures:none Family communication : discussed with wife in the hospital Consults :nephrology CODE STATUS: FULL DVT Prophylaxis :heparin Level of care: Med-Surg Status is: Inpatient  Remains inpatient appropriate because:Unsafe d/c plan   Dispo: The patient is from: Home              Anticipated d/c is to: SNF              Anticipated d/c date is: 1 day              Patient currently is medically stable to d/c.   Difficult to place patient Yes  Patient is medically best at baseline. He has labile diabetes overall mentation is back to baseline.  She has accepted bed offer at [facility]. He will discharged tomorrow and will resume his outpatient dialysis schedule from next Tuesday.    TOTAL TIME TAKING CARE OF THIS PATIENT: 25 minutes.  >50% time spent on counselling and coordination of care  Note: This dictation was prepared with Dragon dictation along with smaller phrase technology. Any transcriptional errors that result from this process are unintentional.  Fritzi Mandes M.D    [facility]   CC: Primary care physician; [name], MDPatient ID: [name], male   DOB: [birth year], 60 y.o.   MRN: [id number]

## 2020-06-24 NOTE — Progress Notes (Signed)
PT Cancellation Note  Patient Details Name: [name] MRN: [id number] DOB: 01/11/1961   Cancelled Treatment:    Reason Eval/Treat Not Completed: Patient at procedure or test/unavailable.  Pt still not in room (currently off floor at dialysis).  Will re-attempt PT session at a later date/time.  Leitha Bleak, PT 06/24/20, 2:25 PM

## 2020-06-24 NOTE — Progress Notes (Signed)
Pt non-compliant with bed alarm and falls safety precautions; low bed and floor mat initiated at this time

## 2020-06-25 LAB — GLUCOSE, CAPILLARY
Glucose-Capillary: 174 mg/dL — ABNORMAL HIGH (ref 70–99)
Glucose-Capillary: 328 mg/dL — ABNORMAL HIGH (ref 70–99)

## 2020-06-25 MED ORDER — LANTUS SOLOSTAR 100 UNIT/ML ~~LOC~~ SOPN: 2.0000 [IU] | PEN_INJECTOR | Freq: Every day | SUBCUTANEOUS | 11 refills | Status: AC

## 2020-06-25 NOTE — TOC Transition Note (Signed)
[facility] - CM/SW Discharge Note   Patient Details  Name: BALJINDER KRECH MRN: [id number] Date of Birth: 1961/03/29  [facility]) CM/SW Contact:  Boris Sharper, LCSW Phone Number: 06/25/2020, 11:19 AM   Clinical Narrative:    Pt is medically stable for discharge per MD. Pt will be transported to [facility] vis EMS, CSW will arrange transport once nursing staff is ready. CSW notified pt's spouse of discharge.   Final next level of care: Home/Self Care Barriers to Discharge: Continued Medical Work up   Patient Goals and CMS Choice        Discharge Placement                       Discharge Plan and Services In-house Referral: Clinical Social Work                                   Social Determinants of Health (SDOH) Interventions     Readmission Risk Interventions Readmission Risk Prevention Plan 06/18/2020 04/22/2020 04/08/2020  Transportation Screening Complete Complete Complete  PCP or Specialist Appt within 3-5 Days - - -  HRI or [place] - - -  Palliative Care Screening - - -  Medication Review (RN Care Manager) Complete Complete Complete  PCP or Specialist appointment within 3-5 days of discharge Complete Complete Complete  HRI or Home Care Consult Complete Complete Complete  SW Recovery Care/Counseling Consult Complete - Complete  Palliative Care Screening Not Applicable Not Applicable Not [place] Not Applicable Patient Refused Not Applicable  Some recent data might be hidden

## 2020-06-25 NOTE — Plan of Care (Signed)

## 2020-06-25 NOTE — Progress Notes (Signed)
[facility]  ROUNDING NOTE   Subjective:   Patient alert, lying in bed. Denies any shortness of breath, edema. Had HD yesterday, denies any issues. Ate his breakfast this morning.   Objective:  Vital signs in last 24 hours:  Temp:  [97.4 F (36.3 C)-98.2 F (36.8 C)] 97.7 F (36.5 C) (02/12 0817) Pulse Rate:  [67-81] 68 (02/12 1200) Resp:  [15-18] 17 (02/12 1200) BP: (121-150)/(68-101) 125/74 (02/12 1200) SpO2:  [83 %-100 %] 99 % (02/12 1200)  Weight change:  Filed Weights   06/19/20 0400 06/20/20 0400 06/21/20 0221  Weight: 60 kg 60.1 kg 62.4 kg    Intake/Output: I/O last 3 completed shifts: In: [id number] [P.O.:660; NG/GT:474] Out: 1200 [Urine:200; Other:1000]   Intake/Output this shift:  Total I/O In: 240 [P.O.:240] Out: -   Physical Exam: General: NAD,   Head: Normocephalic, atraumatic. Moist oral mucosal membranes  Eyes: Anicteric, PERRL  Neck: Supple, trachea midline  Lungs:  Decreased bilaterally   Heart: Regular rate and rhythm  Abdomen:  Soft, nontender,   Extremities:  No peripheral edema.  Neurologic: Nonfocal, moving all four extremities  Skin: No lesions  Access: Right AVG     Basic Metabolic Panel: Recent Labs  Lab 06/20/20 2147 06/23/20 0531 06/23/20 0700  NA  --  141  --   K  --  4.5  --   CL  --  105  --   CO2  --  27  --   GLUCOSE 425* 178*  --   BUN  --  22*  --   CREATININE  --  3.33*  --   CALCIUM  --  7.9*  --   PHOS  --   --  3.3    Liver Function Tests: No results for input(s): AST, ALT, ALKPHOS, BILITOT, PROT, ALBUMIN in the last 168 hours. No results for input(s): LIPASE, AMYLASE in the last 168 hours. No results for input(s): AMMONIA in the last 168 hours.  CBC: Recent Labs  Lab 06/19/20 0433 06/20/20 0427 06/23/20 0531  WBC 5.2 5.2 4.2  NEUTROABS 3.2 3.4 2.8  HGB 9.0* 10.1* 9.4*  HCT 29.1* 33.2* 30.0*  MCV 90.7 92.5 91.5  PLT 96* 100* 98*    Cardiac Enzymes: No results for input(s): CKTOTAL, CKMB,  CKMBINDEX, TROPONINI in the last 168 hours.  BNP: Invalid input(s): POCBNP  CBG: Recent Labs  Lab 06/24/20 0734 06/24/20 1434 06/24/20 1724 06/24/20 2041 06/25/20 0818  GLUCAP 137* 167* 307* 341* 174*    Microbiology: Results for orders placed or performed during the hospital encounter of 06/17/20  Blood Culture (routine x 2)     Status: None   Collection Time: 06/17/20  2:09 PM   Specimen: BLOOD  Result Value Ref Range Status   Specimen Description BLOOD LEFT ANTECUBITAL  Final   Special Requests   Final    BOTTLES DRAWN AEROBIC AND ANAEROBIC Blood Culture adequate volume   Culture   Final    NO GROWTH 5 DAYS Performed at [facility], [address], [place], [place] [address]    Report Status 06/22/2020 FINAL  Final  Blood Culture (routine x 2)     Status: None   Collection Time: 06/17/20  2:09 PM   Specimen: BLOOD  Result Value Ref Range Status   Specimen Description BLOOD BLOOD LEFT FOREARM  Final   Special Requests   Final    BOTTLES DRAWN AEROBIC AND ANAEROBIC Blood Culture adequate volume   Culture  Final    NO GROWTH 5 DAYS Performed at [facility], [address] [place], [place] [address]    Report Status 06/22/2020 FINAL  Final  SARS Coronavirus 2 by RT PCR (hospital order, performed in [facility] hospital lab) Nasopharyngeal Nasopharyngeal Swab     Status: None   Collection Time: 06/17/20  3:15 PM   Specimen: Nasopharyngeal Swab  Result Value Ref Range Status   SARS Coronavirus 2 NEGATIVE NEGATIVE Final    Comment: (NOTE) SARS-CoV-2 target nucleic acids are NOT DETECTED.  The SARS-CoV-2 RNA is generally detectable in upper and lower respiratory specimens during the acute phase of infection. The lowest concentration of SARS-CoV-2 viral copies this assay can detect is 250 copies / mL. A negative result does not preclude SARS-CoV-2 infection and should not be used as the sole basis for treatment or other patient management  decisions.  A negative result may occur with improper specimen collection / handling, submission of specimen other than nasopharyngeal swab, presence of viral mutation(s) within the areas targeted by this assay, and inadequate number of viral copies (<250 copies / mL). A negative result must be combined with clinical observations, patient history, and epidemiological information.  Fact Sheet for Patients:   StrictlyIdeas.no  Fact Sheet for Healthcare Providers: BankingDealers.co.za  This test is not yet approved or  cleared by the [place] FDA and has been authorized for detection and/or diagnosis of SARS-CoV-2 by FDA under an Emergency Use Authorization (EUA).  This EUA will remain in effect (meaning this test can be used) for the duration of the COVID-19 declaration under Section 564(b)(1) of the Act, 21 U.S.C. section 360bbb-3(b)(1), unless the authorization is terminated or revoked sooner.  Performed at [facility], [address] [place], [place] [address]   MRSA PCR Screening     Status: None   Collection Time: 06/18/20  1:37 AM   Specimen: Nasal Mucosa; Nasopharyngeal  Result Value Ref Range Status   MRSA by PCR NEGATIVE NEGATIVE Final    Comment:        The GeneXpert MRSA Assay (FDA approved for NASAL specimens only), is one component of a comprehensive MRSA colonization surveillance program. It is not intended to diagnose MRSA infection nor to guide or monitor treatment for MRSA infections. Performed at [facility], [address] [place],  [address]   Resp Panel by RT-PCR (Flu A&B, Covid) Nasopharyngeal Swab     Status: None   Collection Time: 06/24/20  5:26 PM   Specimen: Nasopharyngeal Swab; Nasopharyngeal(NP) swabs in vial transport medium  Result Value Ref Range Status   SARS Coronavirus 2 by RT PCR NEGATIVE NEGATIVE Final    Comment: (NOTE) SARS-CoV-2 target nucleic acids  are NOT DETECTED.  The SARS-CoV-2 RNA is generally detectable in upper respiratory specimens during the acute phase of infection. The lowest concentration of SARS-CoV-2 viral copies this assay can detect is 138 copies/mL. A negative result does not preclude SARS-Cov-2 infection and should not be used as the sole basis for treatment or other patient management decisions. A negative result may occur with  improper specimen collection/handling, submission of specimen other than nasopharyngeal swab, presence of viral mutation(s) within the areas targeted by this assay, and inadequate number of viral copies(<138 copies/mL). A negative result must be combined with clinical observations, patient history, and epidemiological information. The expected result is Negative.  Fact Sheet for Patients:  EntrepreneurPulse.com.au  Fact Sheet for Healthcare Providers:  IncredibleEmployment.be  This test is no t  yet approved or cleared by the [place] and  has been authorized for detection and/or diagnosis of SARS-CoV-2 by FDA under an Emergency Use Authorization (EUA). This EUA will remain  in effect (meaning this test can be used) for the duration of the COVID-19 declaration under Section 564(b)(1) of the Act, 21 U.S.C.section 360bbb-3(b)(1), unless the authorization is terminated  or revoked sooner.       Influenza A by PCR NEGATIVE NEGATIVE Final   Influenza B by PCR NEGATIVE NEGATIVE Final    Comment: (NOTE) The Xpert Xpress SARS-CoV-2/FLU/RSV plus assay is intended as an aid in the diagnosis of influenza from Nasopharyngeal swab specimens and should not be used as a sole basis for treatment. Nasal washings and aspirates are unacceptable for Xpert Xpress SARS-CoV-2/FLU/RSV testing.  Fact Sheet for Patients: EntrepreneurPulse.com.au  Fact Sheet for Healthcare Providers: IncredibleEmployment.be  This test is  not yet approved or cleared by the [place] FDA and has been authorized for detection and/or diagnosis of SARS-CoV-2 by FDA under an Emergency Use Authorization (EUA). This EUA will remain in effect (meaning this test can be used) for the duration of the COVID-19 declaration under Section 564(b)(1) of the Act, 21 U.S.C. section 360bbb-3(b)(1), unless the authorization is terminated or revoked.  Performed at [facility], [address] [place], [place] [address]     Coagulation Studies: No results for input(s): LABPROT, INR in the last 72 hours.  Urinalysis: No results for input(s): COLORURINE, LABSPEC, PHURINE, GLUCOSEU, HGBUR, BILIRUBINUR, KETONESUR, PROTEINUR, UROBILINOGEN, NITRITE, LEUKOCYTESUR in the last 72 hours.  Invalid input(s): APPERANCEUR    Imaging: No results found.   Medications:    . aspirin EC  81 mg Oral Daily  . carvedilol  6.25 mg Oral BID WC  . Chlorhexidine Gluconate Cloth  6 each Topical Q0600  . epoetin alfa  4,000 Units Intravenous Q M,W,F-HD  . feeding supplement (NEPRO CARB STEADY)  237 mL Oral TID BM  . hydrALAZINE  25 mg Oral BID  . insulin aspart  0-6 Units Subcutaneous TID WC  . insulin glargine  2 Units Subcutaneous QHS  . lidocaine-prilocaine  1 application Topical UD  . melatonin  5 mg Oral QHS  . mirtazapine  7.5 mg Oral QHS  . multivitamin  1 tablet Oral Daily  . sacubitril-valsartan  1 tablet Oral BID  . sertraline  150 mg Oral Daily   acetaminophen **OR** acetaminophen, ondansetron **OR** ondansetron (ZOFRAN) IV  Assessment/ Plan:  [name] is a 60 y.o.  male with history of end-stage renal disease on hemodialysis Tuesdays Thursdays and Saturdays.  He was sent to the ED from his dialysis center due to altered mental status.  He was hypoglycemic with blood sugar 26 on arrival.  Patient missed his dialysis treatment for over 1 week  Prior to this admission.  1. ESRD on Dialysis TTS  - plan for additional HD  today to resume normal schedule.   2. Anemia of CKD - Epo given with HD - hgb 9.4  3. Hypertension - BP controlled.  - continue carvedilol, hydralazine, Entresto    LOS: 8 [place] P Tedrow 2/12/20221:07 PM

## 2020-06-25 NOTE — Discharge Summary (Signed)
[place] [facility] [place] NAME: [name]    MR#:  [id number]  DATE OF BIRTH:  January 03, 1961  DATE OF ADMISSION:  06/17/2020 ADMITTING PHYSICIAN: Collier Bullock, MD  DATE OF DISCHARGE: 06/25/2020  PRIMARY CARE PHYSICIAN: [name], MD    ADMISSION DIAGNOSIS:  Hypoglycemia [E16.2] Healthcare-associated pneumonia [J18.9] Generalized weakness [R53.1] Hypothermia, initial encounter [T68.XXXA] Acute metabolic encephalopathy [id number] Sepsis, due to unspecified organism, unspecified whether acute organ dysfunction present ([place]) [A41.9]  DISCHARGE DIAGNOSIS:  acute metabolic encephalopathy suspected due to hypoglycemia  SECONDARY DIAGNOSIS:   Past Medical History:  Diagnosis Date  . CHF (congestive heart failure) ([place])   . Chronic kidney disease   . Diabetes mellitus without complication ([place])   . ETOH abuse   . GERD (gastroesophageal reflux disease)   . Hyperlipidemia   . Hypertension   . Stroke [facility])     HOSPITAL COURSE:   [name] a 60 y.o.malewithmedical history significant forend-stage renal disease on hemodialysis(dialysis days areT/Th/S),insulin-dependent diabetes mellitus, hypertension, CVA, GERD who was brought into the ER by EMSfrom the dialysis center for evaluation of mental status changes. Patient was found lethargic and unresponsive and his blood sugar was 26  Acute metabolic encephalopathy secondary to hypoglycemia -- Mental status is back to baseline.  Insulin-dependent brittle diabetes with labile BS -Hemoglobin A1c is 7.8.   --Continue Lantus 2 units nightly.  --Decrease sliding scale insulin from sensitive scale to very sensitive scale.  --Recommend bedtime snacks to reduce risk of morning hypoglycemia.  Right foot swelling, probable chronic osteomyelitis of right big toe: --MRI of the right big toe showed findings suggestive of chronic osteomyelitis or inflammatory arthropathy of the distal  phalanx with surrounding reactive marrow.  --d/w wife to have pt f/u Podiatry as out pt after discharged from rehab  Chronic systolic CHF, severe mitral regurgitation:  --Compensated. Last known EF on 12/07/2019 was 45 to 50%. -- Continue Entresto and carvedilol.  ESRD: Follow-up with nephrologist for hemodialysis --2/11-- discussed with Dr. Minus Breeding. Patient will get dialysis today. He can discharge tomorrow to rehab and resume his outpatient dialysis from next Tuesday.  Chronic hypoxic respiratory failure: He uses 2 L/min oxygen at home.  Thrombocytopenia: Platelet count is improving.   History of stroke: Continue aspirin   PT and OT recommend discharge to SNF.  Procedures:none Family communication : discussed with wife in the hospital 2/12 Consults :nephrology CODE STATUS: FULL DVT Prophylaxis :heparin Level of care: Med-Surg Status is: Inpatient  Remains inpatient appropriate because:Unsafe d/c plan   Dispo: The patient is from: Home  Anticipated d/c is to: SNF--[facility]   Anticipated d/c date [id number]  Patient currently is medically stable to d/c.              Difficult to place patient NO  Patient is medically best at baseline.  She has accepted bed offer at [facility]. He will discharged today and will resume his outpatient dialysis schedule from next Tuesday.    CONSULTS OBTAINED:    DRUG ALLERGIES:   Allergies  Allergen Reactions  . Ferrous Gluconate Nausea And Vomiting  . Other     DISCHARGE MEDICATIONS:   Allergies as of 06/25/2020      Reactions   Ferrous Gluconate Nausea And Vomiting   Other       Medication List    STOP taking these medications   folic acid 1 MG tablet Commonly known as: FOLVITE   insulin lispro 100 UNIT/ML injection Commonly  known as: HUMALOG   thiamine 100 MG tablet Commonly known as: VITAMIN B-1     TAKE these medications   aspirin 81 MG EC  tablet Take 1 tablet (81 mg total) by mouth daily.   carvedilol 6.25 MG tablet Commonly known as: COREG Take 6.25 mg by mouth 2 (two) times daily with a meal. He is taking this medication twice daily   Entresto 24-26 MG Generic drug: sacubitril-valsartan Take 1 tablet by mouth 2 (two) times daily.   epoetin alfa 10000 UNIT/ML injection Commonly known as: EPOGEN Inject 0.4 mLs (4,000 Units total) into the vein Every Tuesday,Thursday,and Saturday with dialysis.   feeding supplement (NEPRO CARB STEADY) Liqd Take 237 mLs by mouth 3 (three) times daily between meals.   hydrALAZINE 25 MG tablet Commonly known as: APRESOLINE Take 1 tablet (25 mg total) by mouth in the morning and at bedtime.   iron polysaccharides 150 MG capsule Commonly known as: NIFEREX Take 1 capsule (150 mg total) by mouth daily.   Lantus SoloStar 100 UNIT/ML Solostar Pen Generic drug: insulin glargine Inject 2 Units into the skin at bedtime. What changed: when to take this   lidocaine-prilocaine cream Commonly known as: EMLA Apply 1 application topically as directed.   Melatonin 5 MG Caps Take 5 mg by mouth at bedtime.   mirtazapine 7.5 MG tablet Commonly known as: REMERON Take 7.5 mg by mouth at bedtime. He is taking this medication at bedtime   multivitamin Tabs tablet Take 1 tablet by mouth daily.   sertraline 100 MG tablet Commonly known as: ZOLOFT Take 1.5 tablets (150 mg total) by mouth daily.       If you experience worsening of your admission symptoms, develop shortness of breath, life threatening emergency, suicidal or homicidal thoughts you must seek medical attention immediately by calling 911 or calling your MD immediately  if symptoms less severe.  You Must read complete instructions/literature along with all the possible adverse reactions/side effects for all the Medicines you take and that have been prescribed to you. Take any new Medicines after you have completely understood and  accept all the possible adverse reactions/side effects.   Please note  You were cared for by a hospitalist during your hospital stay. If you have any questions about your discharge medications or the care you received while you were in the hospital after you are discharged, you can call the unit and asked to speak with the hospitalist on call if the hospitalist that took care of you is not available. Once you are discharged, your primary care physician will handle any further medical issues. Please note that NO REFILLS for any discharge medications will be authorized once you are discharged, as it is imperative that you return to your primary care physician (or establish a relationship with a primary care physician if you do not have one) for your aftercare needs so that they can reassess your need for medications and monitor your lab values. Today   SUBJECTIVE   Eating BF. Doing well  VITAL SIGNS:  Blood pressure (!) 150/101, pulse 81, temperature 97.7 F (36.5 C), resp. rate 16, height '5\' 11"'$  (1.803 m), weight 62.4 kg, SpO2 95 %.  I/O:    Intake/Output Summary (Last 24 hours) at 06/25/2020 0850 Last data filed at 06/25/2020 0502 Gross per 24 hour  Intake 537 ml  Output 1200 ml  Net -663 ml    PHYSICAL EXAMINATION:  GENERAL:  60 y.o.-year-old patient lying in the bed with no acute  distress.   LUNGS: Normal breath sounds bilaterally, no wheezing, rales,rhonchi or crepitation. No use of accessory muscles of respiration.  CARDIOVASCULAR: S1, S2 normal. No murmurs, rubs, or gallops.  ABDOMEN: Soft, non-tender, non-distended. Bowel sounds present. No organomegaly or mass.  EXTREMITIES: No pedal edema, cyanosis, or clubbing.  NEUROLOGIC: grossly intact+ PSYCHIATRIC:patient is alert and oriented x 2 SKIN: No obvious rash, lesion, or ulcer.   DATA REVIEW:   CBC  Recent Labs  Lab 06/23/20 0531  WBC 4.2  HGB 9.4*  HCT 30.0*  PLT 98*    Chemistries  Recent Labs  Lab  06/23/20 0531  NA 141  K 4.5  CL 105  CO2 27  GLUCOSE 178*  BUN 22*  CREATININE 3.33*  CALCIUM 7.9*    Microbiology Results   Recent Results (from the past 240 hour(s))  Blood Culture (routine x 2)     Status: None   Collection Time: 06/17/20  2:09 PM   Specimen: BLOOD  Result Value Ref Range Status   Specimen Description BLOOD LEFT ANTECUBITAL  Final   Special Requests   Final    BOTTLES DRAWN AEROBIC AND ANAEROBIC Blood Culture adequate volume   Culture   Final    NO GROWTH 5 DAYS Performed at [facility], [address], [place], [place] [address]    Report Status 06/22/2020 FINAL  Final  Blood Culture (routine x 2)     Status: None   Collection Time: 06/17/20  2:09 PM   Specimen: BLOOD  Result Value Ref Range Status   Specimen Description BLOOD BLOOD LEFT FOREARM  Final   Special Requests   Final    BOTTLES DRAWN AEROBIC AND ANAEROBIC Blood Culture adequate volume   Culture   Final    NO GROWTH 5 DAYS Performed at [facility], [address] [place], [place] [address]    Report Status 06/22/2020 FINAL  Final  SARS Coronavirus 2 by RT PCR (hospital order, performed in [facility] hospital lab) Nasopharyngeal Nasopharyngeal Swab     Status: None   Collection Time: 06/17/20  3:15 PM   Specimen: Nasopharyngeal Swab  Result Value Ref Range Status   SARS Coronavirus 2 NEGATIVE NEGATIVE Final    Comment: (NOTE) SARS-CoV-2 target nucleic acids are NOT DETECTED.  The SARS-CoV-2 RNA is generally detectable in upper and lower respiratory specimens during the acute phase of infection. The lowest concentration of SARS-CoV-2 viral copies this assay can detect is 250 copies / mL. A negative result does not preclude SARS-CoV-2 infection and should not be used as the sole basis for treatment or other patient management decisions.  A negative result may occur with improper specimen collection / handling, submission of specimen other than nasopharyngeal  swab, presence of viral mutation(s) within the areas targeted by this assay, and inadequate number of viral copies (<250 copies / mL). A negative result must be combined with clinical observations, patient history, and epidemiological information.  Fact Sheet for Patients:   StrictlyIdeas.no  Fact Sheet for Healthcare Providers: BankingDealers.co.za  This test is not yet approved or  cleared by the [place] FDA and has been authorized for detection and/or diagnosis of SARS-CoV-2 by FDA under an Emergency Use Authorization (EUA).  This EUA will remain in effect (meaning this test can be used) for the duration of the COVID-19 declaration under Section 564(b)(1) of the Act, 21 U.S.C. section 360bbb-3(b)(1), unless the authorization is terminated or revoked sooner.  Performed at [facility], [place]  [address] [place], [place] [address]   MRSA PCR Screening     Status: None   Collection Time: 06/18/20  1:37 AM   Specimen: Nasal Mucosa; Nasopharyngeal  Result Value Ref Range Status   MRSA by PCR NEGATIVE NEGATIVE Final    Comment:        The GeneXpert MRSA Assay (FDA approved for NASAL specimens only), is one component of a comprehensive MRSA colonization surveillance program. It is not intended to diagnose MRSA infection nor to guide or monitor treatment for MRSA infections. Performed at [facility], [address] [place], [place] [address]   Resp Panel by RT-PCR (Flu A&B, Covid) Nasopharyngeal Swab     Status: None   Collection Time: 06/24/20  5:26 PM   Specimen: Nasopharyngeal Swab; Nasopharyngeal(NP) swabs in vial transport medium  Result Value Ref Range Status   SARS Coronavirus 2 by RT PCR NEGATIVE NEGATIVE Final    Comment: (NOTE) SARS-CoV-2 target nucleic acids are NOT DETECTED.  The SARS-CoV-2 RNA is generally detectable in upper respiratory specimens during the acute phase of infection. The  lowest concentration of SARS-CoV-2 viral copies this assay can detect is 138 copies/mL. A negative result does not preclude SARS-Cov-2 infection and should not be used as the sole basis for treatment or other patient management decisions. A negative result may occur with  improper specimen collection/handling, submission of specimen other than nasopharyngeal swab, presence of viral mutation(s) within the areas targeted by this assay, and inadequate number of viral copies(<138 copies/mL). A negative result must be combined with clinical observations, patient history, and epidemiological information. The expected result is Negative.  Fact Sheet for Patients:  EntrepreneurPulse.com.au  Fact Sheet for Healthcare Providers:  IncredibleEmployment.be  This test is no t yet approved or cleared by the [place] FDA and  has been authorized for detection and/or diagnosis of SARS-CoV-2 by FDA under an Emergency Use Authorization (EUA). This EUA will remain  in effect (meaning this test can be used) for the duration of the COVID-19 declaration under Section 564(b)(1) of the Act, 21 U.S.C.section 360bbb-3(b)(1), unless the authorization is terminated  or revoked sooner.       Influenza A by PCR NEGATIVE NEGATIVE Final   Influenza B by PCR NEGATIVE NEGATIVE Final    Comment: (NOTE) The Xpert Xpress SARS-CoV-2/FLU/RSV plus assay is intended as an aid in the diagnosis of influenza from Nasopharyngeal swab specimens and should not be used as a sole basis for treatment. Nasal washings and aspirates are unacceptable for Xpert Xpress SARS-CoV-2/FLU/RSV testing.  Fact Sheet for Patients: EntrepreneurPulse.com.au  Fact Sheet for Healthcare Providers: IncredibleEmployment.be  This test is not yet approved or cleared by the [place] FDA and has been authorized for detection and/or diagnosis of SARS-CoV-2 by FDA under  an Emergency Use Authorization (EUA). This EUA will remain in effect (meaning this test can be used) for the duration of the COVID-19 declaration under Section 564(b)(1) of the Act, 21 U.S.C. section 360bbb-3(b)(1), unless the authorization is terminated or revoked.  Performed at [facility], [address], [place], [place] [address]     RADIOLOGY:  No results found.   CODE STATUS:     Code Status Orders  (From admission, onward)         Start     Ordered   06/17/20 1700  Full code  Continuous        06/17/20 1700        Code Status History    Date  Active Date Inactive Code Status Order ID Comments User Context   04/20/2020 1048 04/23/2020 0121 Full Code [id number]  Collier Bullock, MD ED   04/08/2020 1035 04/12/2020 2216 Full Code [id number]  Collier Bullock, MD ED   01/09/2020 0953 01/13/2020 1916 Full Code [id number]  Fritzi Mandes, MD ED   12/24/2019 2039 01/04/2020 2302 Full Code [id number]  Elmarie Shiley, MD Inpatient   12/06/2019 1755 12/13/2019 1614 Full Code [id number]  Vashti Hey, MD ED   09/16/2019 2309 09/17/2019 2254 Full Code [id number]  Mansy, Arvella Merles, MD ED   08/24/2019 2258 08/25/2019 2238 Full Code [id number]  Acheampong, Warnell Bureau, MD ED   07/06/2019 0157 07/09/2019 0135 Full Code [id number]  Cristescu, Linard Millers, MD Inpatient   06/04/2019 0409 06/09/2019 2308 Full Code [id number]  Athena Masse, MD ED   05/27/2019 2355 05/29/2019 2037 Full Code [id number]  Orene Desanctis, DO ED   04/09/2019 0207 04/15/2019 1610 Full Code [id number]  Jani Gravel, MD ED   03/04/2019 2110 03/07/2019 1654 Full Code [id number]  Lang Snow, NP ED   07/25/2018 1842 07/27/2018 1502 Full Code [id number]  Henreitta Leber, MD Inpatient   02/26/2018 2332 03/04/2018 1859 Full Code [id number]  Lance Coon, MD Inpatient   01/15/2018 2158 01/16/2018 1909 Full Code [id number]  Henreitta Leber, MD Inpatient   08/17/2017 0000 08/21/2017 1532 Full Code [id number]  Lance Coon, MD ED    06/29/2016 2124 07/01/2016 1805 Full Code [id number]  Vaughan Basta, MD Inpatient   Advance Care Planning Activity       TOTAL TIME TAKING CARE OF THIS PATIENT: *35* minutes.    Fritzi Mandes M.D  [facility]    CC: Primary care physician; [name], MD

## 2020-06-25 NOTE — Progress Notes (Signed)
PT Cancellation Note  Patient Details Name: [name] MRN: [id number] DOB: 02-11-1961   Cancelled Treatment:    Reason Eval/Treat Not Completed: Patient declined, no reason specified. Pt returned from HD and on bed pan eating crackers upon PT entry. Pt with DC orders. Pt declining to participate with therapy this afternoon, as he wishes to save his energy for DC. Will follow up with therapy intervention as appropriate.   Herminio Commons, PT, DPT 3:02 PM,06/25/20

## 2020-06-25 NOTE — Discharge Instructions (Signed)
Pt to resume HD out pt tue/thurs/sat

## 2020-06-25 NOTE — Progress Notes (Signed)
Report attempted 3 times in the past 30 minutes. Will attempt again before transport.

## 2020-06-25 NOTE — Progress Notes (Signed)
PT Cancellation Note  Patient Details Name: [name] MRN: [id number] DOB: 30-Mar-1961   Cancelled Treatment:    Reason Eval/Treat Not Completed: Patient at procedure or test/unavailable. Per RN, pt off floor for HD. Pt with DC orders. Will follow up with therapy intervention today with time permitting.  Herminio Commons, PT, DPT 11:42 AM,06/25/20

## 2020-06-27 ENCOUNTER — Telehealth: Payer: Self-pay | Admitting: Adult Health Nurse Practitioner

## 2020-06-27 NOTE — Telephone Encounter (Signed)
Returned VM of patient's wife.  Patient back home after being hospital.  Set up appointment for 06/29/20 @ 12pm [place]ally Callas K. Olena Heckle NP

## 2020-06-29 ENCOUNTER — Other Ambulatory Visit: Payer: Medicare Other | Admitting: Adult Health Nurse Practitioner

## 2020-07-11 ENCOUNTER — Other Ambulatory Visit (HOSPITAL_COMMUNITY): Payer: Self-pay

## 2020-07-12 NOTE — Progress Notes (Signed)
Today had a visit with [name] and his wife.  He is in the bath with his aid getting a shower.  His wife states was started on thyroid medications.  She has been giving it to him at breakfast.  Explained to her that it has to be took atleast an hour prior to eating, set his med box with it separate to give to him early.  She appears to understand.  He has all his medications.  Filled 2 weeks of boxes up.  Called in refills for entresto, carvedilol, mirtazapine, and sertraline.  They all have refills and will be ready tomorrow.  Also advised his wife he needs more aspirin, she states will pick all up form pharmacy tomorrow after work.  She states he has been doing well, his sugars have been good.  He has a wheel chair and bedside toilet now.  They are watching his diet closely with carbs.  He has been going to his dialysis appts.  Will continue to visit for heart failure, diet and medication management.   [name] [phone]

## 2020-07-18 ENCOUNTER — Telehealth (INDEPENDENT_AMBULATORY_CARE_PROVIDER_SITE_OTHER): Payer: Self-pay | Admitting: Vascular Surgery

## 2020-07-20 ENCOUNTER — Other Ambulatory Visit: Payer: Self-pay

## 2020-07-20 ENCOUNTER — Encounter: Payer: Self-pay | Admitting: Podiatry

## 2020-07-20 ENCOUNTER — Ambulatory Visit (INDEPENDENT_AMBULATORY_CARE_PROVIDER_SITE_OTHER): Payer: Medicare Other | Admitting: Podiatry

## 2020-07-20 ENCOUNTER — Telehealth (INDEPENDENT_AMBULATORY_CARE_PROVIDER_SITE_OTHER): Payer: Self-pay | Admitting: Vascular Surgery

## 2020-07-20 DIAGNOSIS — Z992 Dependence on renal dialysis: Secondary | ICD-10-CM

## 2020-07-20 DIAGNOSIS — E1142 Type 2 diabetes mellitus with diabetic polyneuropathy: Secondary | ICD-10-CM

## 2020-07-20 DIAGNOSIS — I96 Gangrene, not elsewhere classified: Secondary | ICD-10-CM | POA: Diagnosis not present

## 2020-07-20 DIAGNOSIS — B351 Tinea unguium: Secondary | ICD-10-CM | POA: Diagnosis not present

## 2020-07-20 DIAGNOSIS — I700 Atherosclerosis of aorta: Secondary | ICD-10-CM | POA: Insufficient documentation

## 2020-07-20 DIAGNOSIS — I5022 Chronic systolic (congestive) heart failure: Secondary | ICD-10-CM

## 2020-07-20 DIAGNOSIS — M86672 Other chronic osteomyelitis, left ankle and foot: Secondary | ICD-10-CM

## 2020-07-20 DIAGNOSIS — I739 Peripheral vascular disease, unspecified: Secondary | ICD-10-CM | POA: Diagnosis not present

## 2020-07-20 DIAGNOSIS — N2581 Secondary hyperparathyroidism of renal origin: Secondary | ICD-10-CM | POA: Insufficient documentation

## 2020-07-20 DIAGNOSIS — N186 End stage renal disease: Secondary | ICD-10-CM

## 2020-07-20 DIAGNOSIS — M79675 Pain in left toe(s): Secondary | ICD-10-CM

## 2020-07-20 DIAGNOSIS — M79674 Pain in right toe(s): Secondary | ICD-10-CM

## 2020-07-20 NOTE — Telephone Encounter (Signed)
Bring the patient in with ABI's and see Schnier or Arna Medici

## 2020-07-20 NOTE — Telephone Encounter (Signed)
Called patient and spouse to set up appointment, left VM.

## 2020-07-20 NOTE — Telephone Encounter (Signed)
Angie from [facility] called to set up a 1 week appointment per Dr. Sherryle Lis for gangrene w/no pulse lower extremities for patient. Patient had PERIPHERAL VASCULAR THROMBECTOMY (JD) 07/05/2020. Please advise.

## 2020-07-20 NOTE — Progress Notes (Signed)
Subjective:  [name], male    DOB: December 16, 1960,  MRN: [id number]  Chief Complaint  Patient presents with  . Nail Problem  . Diabetes  . Diabetic Ulcer    Nail trim DFC, RLE swelling, denies any pain.  He has necrotic toes left foot    60 y.o. male presents with the above complaint. History confirmed with patient.  He presents with a caretaker, his wife is available by phone.  He was recently hospitalized at [facility].  An MRI was completed and Dr. Vickki Muff from [place] [facility] saw him as well.  After he returned home they noticed the toes on the left foot began to start to turn black.  He has a previous second toe amputation on the side Dr. Caryl Comes last fall.  Objective:  Physical Exam: Both feet are cool to touch.  He has nonpalpable pulses bilaterally.  There is pitting edema on the right side.  Left hallux third and fourth toes have gangrenous patches.  No gross purulence.  Dystrophic mycotic nails x9 with thickening and subungual debris  A1c in February 2022 was 7.9%   Study Result  Narrative & Impression  CLINICAL DATA:  Diabetic foot infection  EXAM: MRI OF THE RIGHT TOES WITHOUT CONTRAST  TECHNIQUE: Multiplanar, multisequence MR imaging of the right was performed. No intravenous contrast was administered.  COMPARISON:  Radiograph same day  FINDINGS: Bones/Joint/Cartilage  There is area of erosive type change seen at the medial aspect of the distal phalanx. There is increased T2 hyperintense signal seen within the distal phalanx without T1 hypointensity. There is mildly increased T2 hyperintense signal seen within the midfoot. The remainder of the osseous structures are normal appearance without acute fracture or pathologic marrow infiltration.  Ligaments  The Lisfranc ligaments are intact.  Muscles and Tendons  Mild fatty atrophy seen within the muscles of the forefoot with diffusely increased signal, likely due to chronic  denervation atrophy. The tendons appear to be intact.  Soft tissues  Area of superficial ulceration along the medial aspect of the distal first phalanx. No loculated fluid collections are seen. There is skin thickening and subcutaneous edema seen along the dorsum of the midfoot.  IMPRESSION: Findings which may be suggestive of chronic osteomyelitis or inflammatory arthropathy of the distal first phalanx with surrounding reactive marrow. No definite evidence of acute osteomyelitis.  Diffuse dorsal subcutaneous edema which may be due to cellulitis.   Electronically Signed   By: Prudencio Pair M.D.   On: 06/21/2020 01:43     Assessment:   1. PVD (peripheral vascular disease) ([place])   2. Gangrene of toe of left foot ([place])   3. Diabetic polyneuropathy associated with type 2 diabetes mellitus (HCC)   4. Pain due to onychomycosis of toenails of both feet   5. Chronic osteomyelitis of toe of left foot ([place])   6. Chronic systolic CHF (congestive heart failure) ([place])   7. ESRD on hemodialysis [facility])      Plan:  Patient was evaluated and treated and all questions answered.  Patient educated on diabetes. Discussed proper diabetic foot care and discussed risks and complications of disease. Educated patient in depth on reasons to return to the office immediately should he/she discover anything concerning or new on the feet. All questions answered. Discussed proper shoes as well.   Discussed the etiology and treatment options for the condition in detail with the patient. Educated patient on the topical and oral treatment options for mycotic nails. Recommended debridement  of the nails today. Sharp and mechanical debridement performed of all painful and mycotic nails today. Nails debrided in length and thickness using a nail nipper to level of comfort. Discussed treatment options including appropriate shoe gear. Follow up as needed for painful nails.  Discussed with the patient, his  caretaker and his wife by phone that he has severe peripheral vascular disease and early dry gangrene of the toes of the left foot.  His recent MRI showed chronic osteomyelitis as well.  He is a heavy smoker, has ESRD on hemodialysis and uncontrolled diabetes.  He is at high risk for limb loss.  I am ordering vascular ultrasound and ABIs TBI's and a consultation to [facility], our office will call and have them schedule ASAP.  He is known to Dr. Lucky Cowboy for his HD access.  I discussed with the him the risks of gangrene and how this may progress.  Also discussed the process of revascularization, amputation and the risk of progressive limb loss.  All questions were addressed.  Today we painted Betadine paint on the toes and I think that she continues at home.  No occlusive dressings or compression.  Advised him to keep feet in dependent positions may increase his swelling which I think is likely from his congestive heart failure but will refer this over ischemia.  Return in 3 weeks for reevaluation   No follow-ups on file.

## 2020-07-25 ENCOUNTER — Other Ambulatory Visit (HOSPITAL_COMMUNITY): Payer: Self-pay

## 2020-07-25 NOTE — Progress Notes (Signed)
Today had a home visit with [name].  He has PT and OT here visiting also.  They worked with [name] and I fixed his medications.  His wife states working out him taking his thyroid early and rest of meds later when he eats.  He missed 1 day of dialysis last week but has made it to all his appts since.  He states been doing ok.  They are watching his diet closely and liquids.  They check his sugars often.  Called in refills for his medications and made wife aware.  He has an aid daily and his wife at night.  He is waiting for arrival of hospital bed which will help him to lay more comfortable.  Will continue to visit for heart failure.   Brentt Fread Rhame [phone]

## 2020-07-29 ENCOUNTER — Encounter (INDEPENDENT_AMBULATORY_CARE_PROVIDER_SITE_OTHER): Payer: Medicare Other | Admitting: Vascular Surgery

## 2020-08-10 ENCOUNTER — Ambulatory Visit: Payer: Medicare Other | Admitting: Podiatry

## 2020-08-12 DEATH — deceased

## 2020-08-15 ENCOUNTER — Other Ambulatory Visit: Payer: Medicare Other | Admitting: Adult Health Nurse Practitioner

## 2020-09-07 ENCOUNTER — Ambulatory Visit: Payer: Medicare Other | Admitting: Family

## 2022-01-18 IMAGING — CT CT ABD-PELV W/O CM
2 of 4 series · 11 of 46 positions shown, 12 images · non-contrast
Comparison: CT 05/27/2019

CLINICAL DATA: Elevated LFTs, mid abdominal pain with nausea and
diarrhea

EXAM:
CT ABDOMEN AND PELVIS WITHOUT CONTRAST
TECHNIQUE: Multidetector CT imaging of the abdomen and pelvis was performed
following the standard protocol without IV contrast.

[Series 2: axials routine abdomen pelvis without 5.00 ax · axial · non-contrast · 0.51mm/px · z∈[-1573,-1188]mm · 8 of 92 slices shown, 9 images]
[im 8/92  soft-tissue]
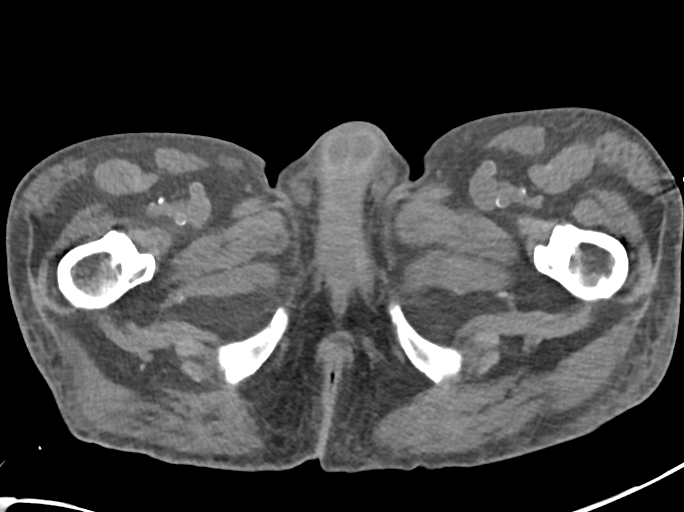
[im 8/92  bone]
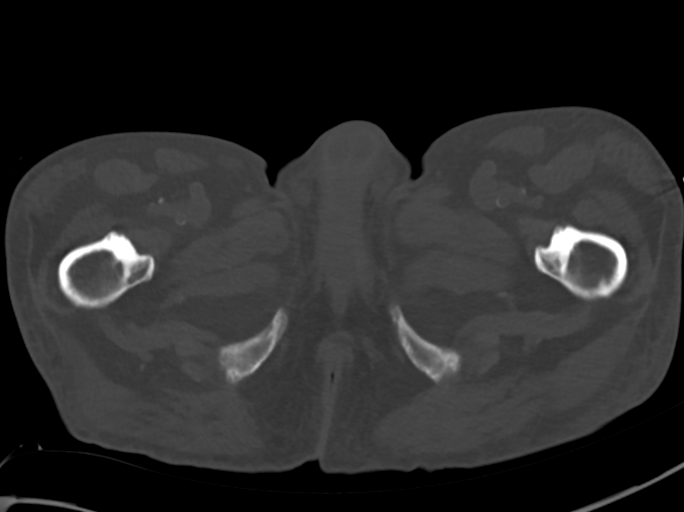
[im 18/92  soft-tissue]
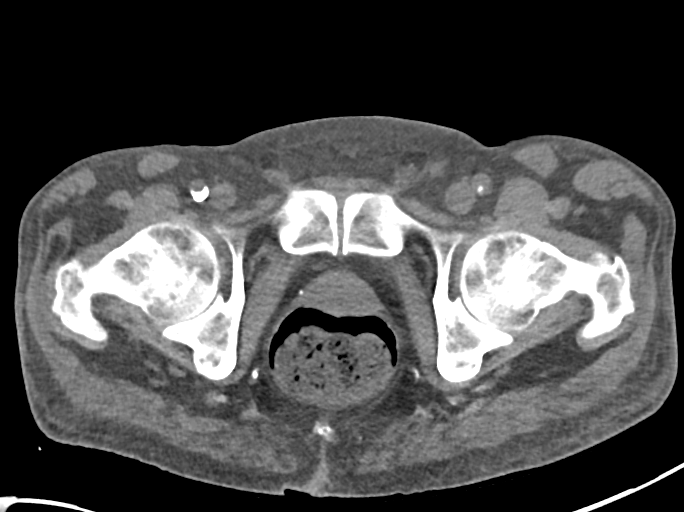
[im 29/92  soft-tissue]
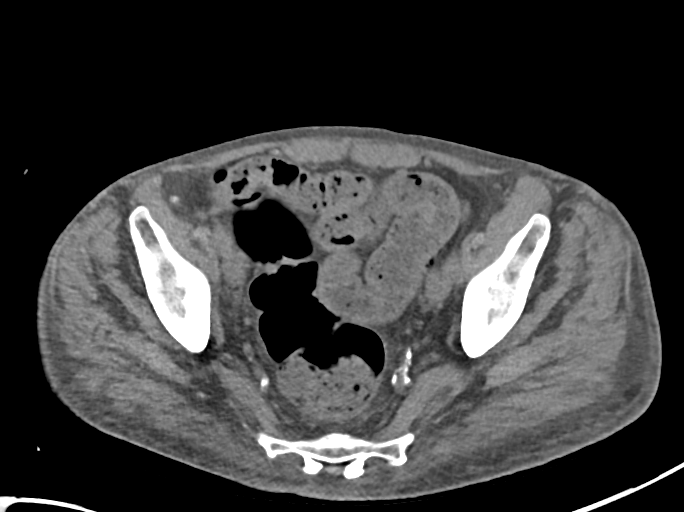
[im 39/92  soft-tissue]
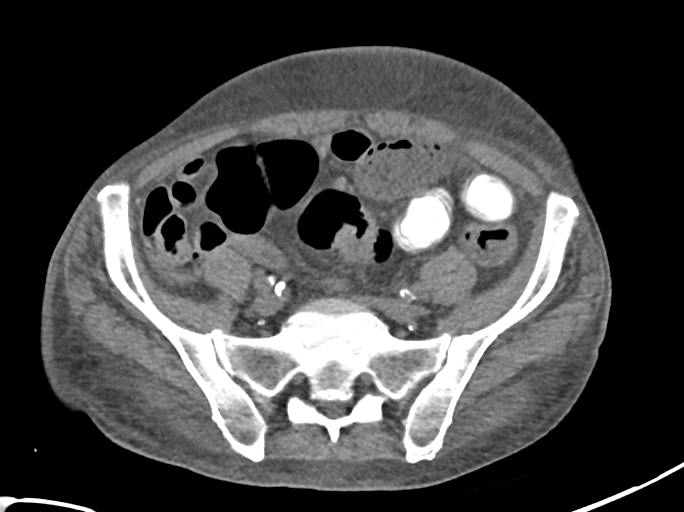
[im 53/92  soft-tissue]
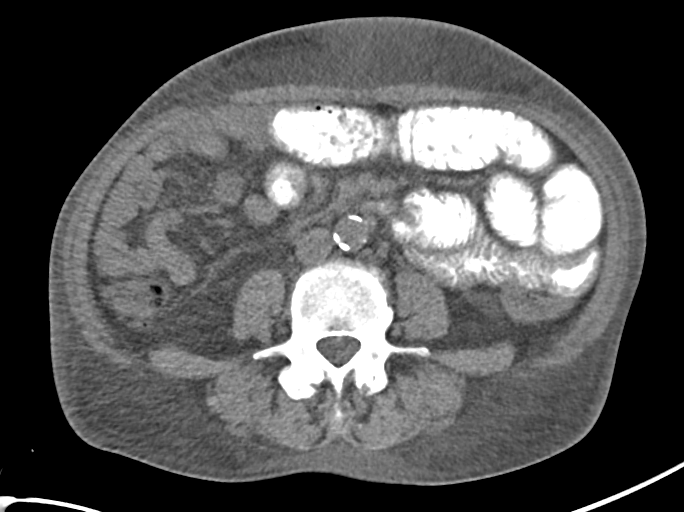
[im 64/92  soft-tissue]
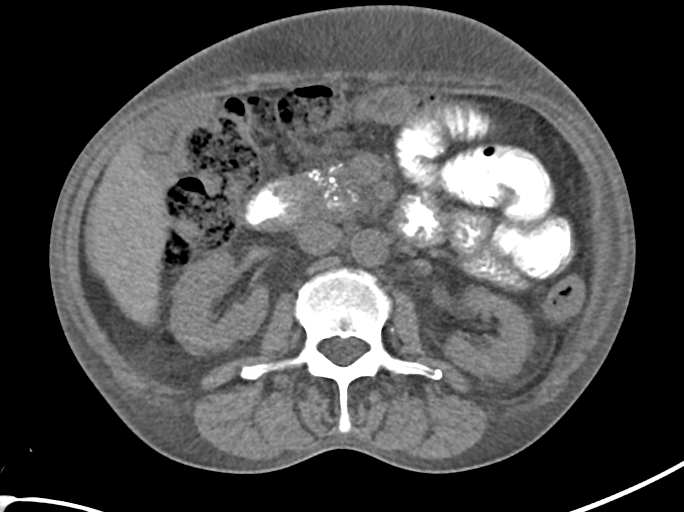
[im 74/92  soft-tissue]
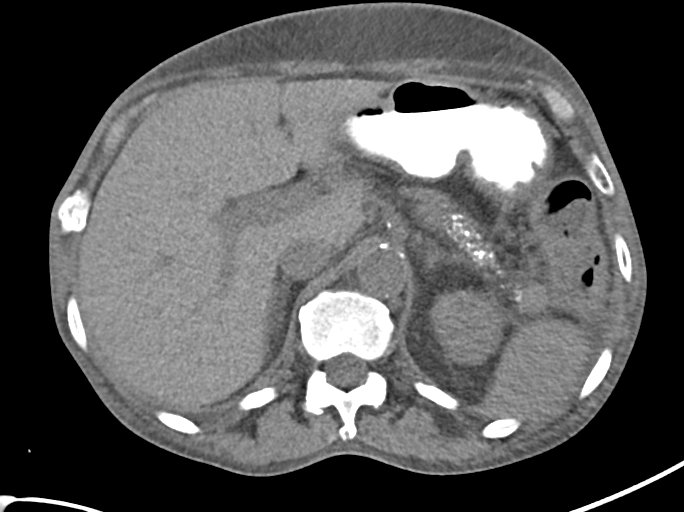
[im 85/92  soft-tissue]
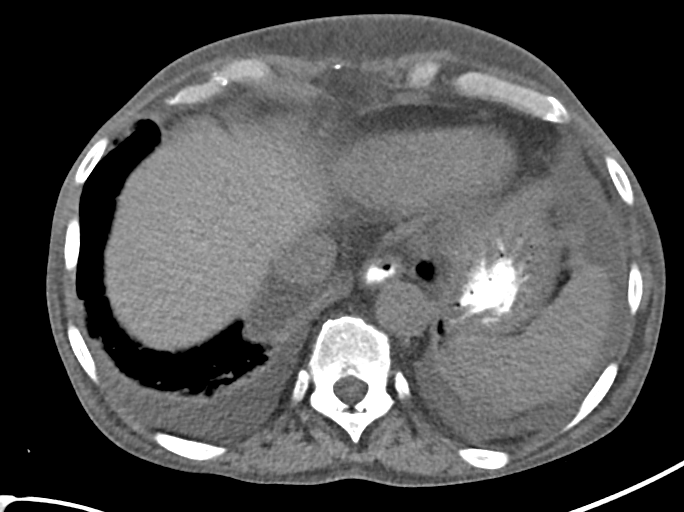

[Series 6: coronals routine abdomen pelvis without 2.00 cor · coronal · non-contrast · 0.69mm/px · 3 of 131 slices shown]
[im 44/131  soft-tissue]
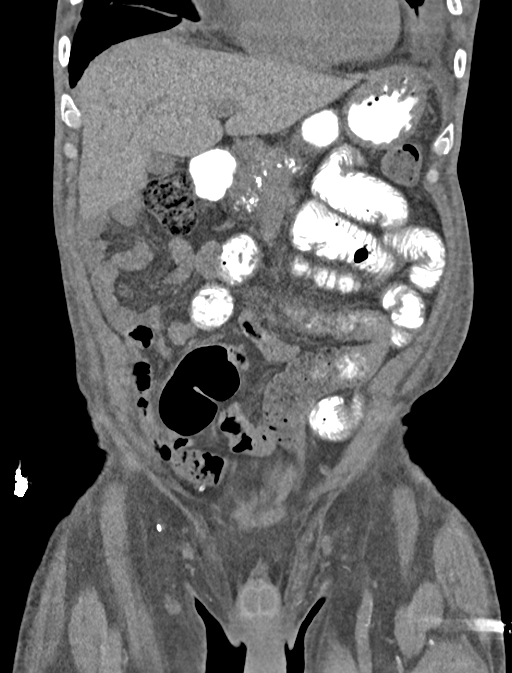
[im 58/131  soft-tissue]
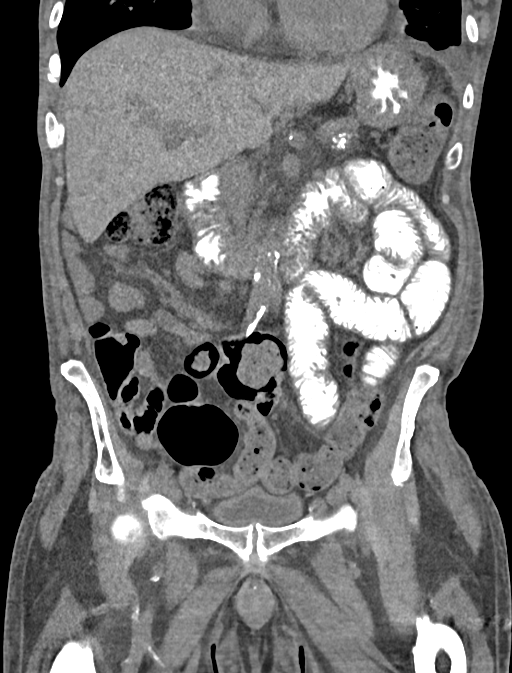
[im 73/131  soft-tissue]
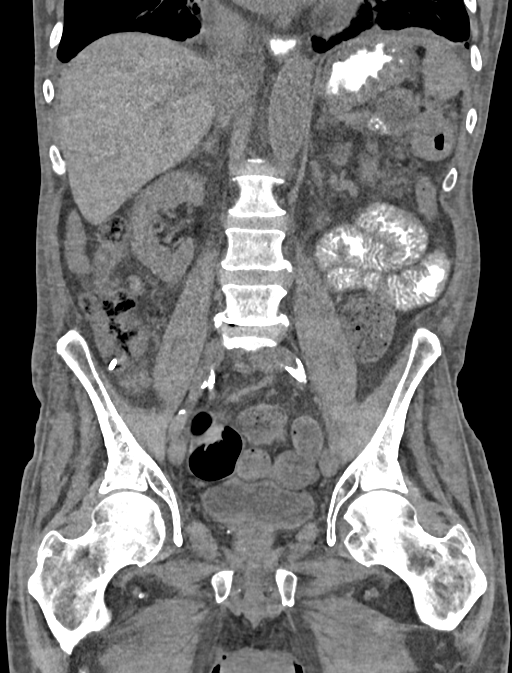

[11 of 46 positions shown; findings below may reference images not displayed]

FINDINGS: Lower chest: Extensive interlobular septal thickening noted in the
lung bases with small pleural effusions and adjacent areas of
passive atelectasis. There is cardiomegaly with trace pericardial
fluid as well. Hypoattenuation of the cardiac blood pool relative to
the myocardium is typically compatible with anemia on this non
contrast CT study

Hepatobiliary: No focal liver abnormality is seen. No gallstones,
gallbladder wall thickening, or biliary dilatation.

Pancreas: Diffuse pancreatic calcifications without focal
peripancreatic inflammation or ductal dilatation.

Spleen: Normal in size without focal abnormality.

Adrenals/Urinary Tract: Normal adrenal glands. Stable mild bilateral
symmetric perinephric stranding, a nonspecific finding though may
correlate with either age or decreased renal function. Kidneys are
otherwise unremarkable, without renal calculi, suspicious lesion, or
hydronephrosis. Urinary bladder is largely decompressed at the time
of exam and therefore poorly evaluated by CT imaging.

Stomach/Bowel: Distal esophagus, stomach and duodenum are
unremarkable. No small bowel dilatation or wall thickening. Enteric
contrast media traverses part way through the small bowel. There is
fecalization of the distal small bowel contents which is nonspecific
but can be seen with slowed intestinal transit. No obstruction is
evident. Postsurgical changes at the tip of the cecum likely reflect
prior appendectomy. No colonic dilatation or wall thickening.

Vascular/Lymphatic: Extensive atherosclerotic plaque within the
normal caliber aorta and branch vessels. Evaluation for adenopathy
limited in the absence of contrast media. No enlarged abdominopelvic
nodes are clearly evident.

Reproductive: Prostate is unremarkable.

Other: Circumferential body wall edema. Hazy edematous changes of
the mid mesentery is noted as well. No free abdominopelvic fluid or
free air. No bowel containing hernias.

Musculoskeletal: Multilevel degenerative changes are present in the
imaged portions of the spine. No acute osseous abnormality or
suspicious osseous lesion. No suspicious or worrisome osseous
lesions.
IMPRESSION: 1. Features may suggest heart failure with cardiomegaly, pulmonary
edema and bilateral effusions. Additional features of more
generalized anasarca present as well with circumferential body wall
edema and edematous mesenteric changes. Given elevated LFTs, could
consider possible hepatic congestion as an underlying etiology.
2. Pancreatic calcifications compatible with sequela of chronic
pancreatitis. No acute peripancreatic inflammation.
3. Fecalization of the distal small bowel contents is nonspecific
but can be seen with slowed intestinal transit. No obstruction is
evident.
4. Extensive atherosclerotic plaque within the normal caliber aorta
and branch vessels.
5. Hypoattenuation of the cardiac blood pool relative to the
myocardium is typically compatible with anemia on this non contrast
CT study.
6. Stable mild bilateral symmetric perinephric stranding, a
nonspecific finding though may correlate with either age or
decreased renal function.
7. Aortic Atherosclerosis ([id number]).
